# Patient Record
Sex: Female | Born: 1955
Health system: Southern US, Community
[De-identification: ages and names within clinical notes are randomized; demographics above are authoritative.]

## PROBLEM LIST (undated history)

## (undated) DIAGNOSIS — M1990 Unspecified osteoarthritis, unspecified site: Secondary | ICD-10-CM

## (undated) DIAGNOSIS — E785 Hyperlipidemia, unspecified: Secondary | ICD-10-CM

## (undated) DIAGNOSIS — G709 Myoneural disorder, unspecified: Secondary | ICD-10-CM

## (undated) DIAGNOSIS — E559 Vitamin D deficiency, unspecified: Secondary | ICD-10-CM

## (undated) DIAGNOSIS — R931 Abnormal findings on diagnostic imaging of heart and coronary circulation: Secondary | ICD-10-CM

## (undated) DIAGNOSIS — R040 Epistaxis: Secondary | ICD-10-CM

## (undated) DIAGNOSIS — Z9289 Personal history of other medical treatment: Secondary | ICD-10-CM

## (undated) DIAGNOSIS — I959 Hypotension, unspecified: Secondary | ICD-10-CM

## (undated) DIAGNOSIS — K5900 Constipation, unspecified: Secondary | ICD-10-CM

## (undated) DIAGNOSIS — J189 Pneumonia, unspecified organism: Secondary | ICD-10-CM

## (undated) DIAGNOSIS — I10 Essential (primary) hypertension: Secondary | ICD-10-CM

## (undated) DIAGNOSIS — G629 Polyneuropathy, unspecified: Secondary | ICD-10-CM

## (undated) DIAGNOSIS — T4145XA Adverse effect of unspecified anesthetic, initial encounter: Secondary | ICD-10-CM

## (undated) DIAGNOSIS — E669 Obesity, unspecified: Secondary | ICD-10-CM

## (undated) DIAGNOSIS — Z992 Dependence on renal dialysis: Secondary | ICD-10-CM

## (undated) DIAGNOSIS — D649 Anemia, unspecified: Secondary | ICD-10-CM

## (undated) DIAGNOSIS — E282 Polycystic ovarian syndrome: Secondary | ICD-10-CM

## (undated) DIAGNOSIS — I319 Disease of pericardium, unspecified: Secondary | ICD-10-CM

## (undated) DIAGNOSIS — G4730 Sleep apnea, unspecified: Secondary | ICD-10-CM

## (undated) DIAGNOSIS — J302 Other seasonal allergic rhinitis: Secondary | ICD-10-CM

## (undated) DIAGNOSIS — I480 Paroxysmal atrial fibrillation: Secondary | ICD-10-CM

## (undated) DIAGNOSIS — N186 End stage renal disease: Secondary | ICD-10-CM

## (undated) DIAGNOSIS — N2581 Secondary hyperparathyroidism of renal origin: Secondary | ICD-10-CM

## (undated) DIAGNOSIS — T8859XA Other complications of anesthesia, initial encounter: Secondary | ICD-10-CM

## (undated) HISTORY — DX: Hyperlipidemia, unspecified: E78.5

## (undated) HISTORY — DX: Sleep apnea, unspecified: G47.30

## (undated) HISTORY — PX: PANNICULECTOMY: SUR1001

## (undated) HISTORY — DX: Vitamin D deficiency, unspecified: E55.9

## (undated) HISTORY — PX: DILATION AND CURETTAGE OF UTERUS: SHX78

## (undated) HISTORY — PX: ABDOMINAL HYSTERECTOMY: SHX81

## (undated) HISTORY — PX: BREAST EXCISIONAL BIOPSY: SUR124

## (undated) HISTORY — DX: Anemia, unspecified: D64.9

## (undated) HISTORY — PX: COLONOSCOPY W/ BIOPSIES AND POLYPECTOMY: SHX1376

## (undated) HISTORY — DX: Secondary hyperparathyroidism of renal origin: N25.81

## (undated) HISTORY — PX: COLONOSCOPY: SHX5424

## (undated) HISTORY — DX: Paroxysmal atrial fibrillation: I48.0

## (undated) HISTORY — PX: BREAST SURGERY: SHX581

## (undated) HISTORY — DX: Essential (primary) hypertension: I10

## (undated) HISTORY — DX: Obesity, unspecified: E66.9

## (undated) HISTORY — DX: Abnormal findings on diagnostic imaging of heart and coronary circulation: R93.1

## (undated) HISTORY — DX: Polycystic ovarian syndrome: E28.2

## (undated) HISTORY — PX: UVULOPLASTY: SHX6557

## (undated) HISTORY — PX: KNEE ARTHROSCOPY: SHX127

## (undated) HISTORY — DX: Personal history of other medical treatment: Z92.89

## (undated) SURGERY — Surgical Case
Anesthesia: *Unknown

---
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
## 1999-03-06 ENCOUNTER — Encounter: Admission: RE | Admit: 1999-03-06 | Discharge: 1999-06-04 | Payer: Self-pay | Admitting: Family Medicine

## 1999-04-06 ENCOUNTER — Ambulatory Visit (HOSPITAL_COMMUNITY): Admission: RE | Admit: 1999-04-06 | Discharge: 1999-04-06 | Payer: Self-pay | Admitting: Family Medicine

## 1999-04-06 ENCOUNTER — Encounter: Payer: Self-pay | Admitting: Obstetrics & Gynecology

## 2000-05-15 ENCOUNTER — Encounter: Payer: Self-pay | Admitting: Family Medicine

## 2000-05-15 ENCOUNTER — Ambulatory Visit (HOSPITAL_COMMUNITY): Admission: RE | Admit: 2000-05-15 | Discharge: 2000-05-15 | Payer: Self-pay | Admitting: Family Medicine

## 2002-09-03 ENCOUNTER — Encounter: Payer: Self-pay | Admitting: Obstetrics and Gynecology

## 2002-09-03 ENCOUNTER — Ambulatory Visit (HOSPITAL_COMMUNITY): Admission: RE | Admit: 2002-09-03 | Discharge: 2002-09-03 | Payer: Self-pay | Admitting: Obstetrics and Gynecology

## 2002-09-17 ENCOUNTER — Encounter: Payer: Self-pay | Admitting: Obstetrics and Gynecology

## 2002-09-17 ENCOUNTER — Encounter: Admission: RE | Admit: 2002-09-17 | Discharge: 2002-09-17 | Payer: Self-pay | Admitting: Obstetrics and Gynecology

## 2002-10-27 ENCOUNTER — Encounter: Payer: Self-pay | Admitting: Obstetrics and Gynecology

## 2002-10-27 ENCOUNTER — Ambulatory Visit (HOSPITAL_COMMUNITY): Admission: RE | Admit: 2002-10-27 | Discharge: 2002-10-27 | Payer: Self-pay | Admitting: Obstetrics and Gynecology

## 2002-11-23 ENCOUNTER — Ambulatory Visit (HOSPITAL_COMMUNITY): Admission: RE | Admit: 2002-11-23 | Discharge: 2002-11-23 | Payer: Self-pay | Admitting: Cardiovascular Disease

## 2002-11-23 ENCOUNTER — Encounter: Payer: Self-pay | Admitting: Cardiovascular Disease

## 2003-05-19 ENCOUNTER — Encounter: Payer: Self-pay | Admitting: Obstetrics and Gynecology

## 2003-05-19 ENCOUNTER — Ambulatory Visit (HOSPITAL_COMMUNITY): Admission: RE | Admit: 2003-05-19 | Discharge: 2003-05-19 | Payer: Self-pay | Admitting: Obstetrics and Gynecology

## 2003-09-12 ENCOUNTER — Encounter: Admission: RE | Admit: 2003-09-12 | Discharge: 2003-09-12 | Payer: Self-pay | Admitting: Family Medicine

## 2003-09-12 ENCOUNTER — Encounter: Payer: Self-pay | Admitting: Family Medicine

## 2005-11-22 ENCOUNTER — Ambulatory Visit (HOSPITAL_COMMUNITY): Admission: RE | Admit: 2005-11-22 | Discharge: 2005-11-22 | Payer: Self-pay | Admitting: Obstetrics and Gynecology

## 2005-11-22 ENCOUNTER — Other Ambulatory Visit: Admission: RE | Admit: 2005-11-22 | Discharge: 2005-11-22 | Payer: Self-pay | Admitting: Obstetrics and Gynecology

## 2006-08-28 ENCOUNTER — Ambulatory Visit (HOSPITAL_COMMUNITY): Admission: RE | Admit: 2006-08-28 | Discharge: 2006-08-28 | Payer: Self-pay | Admitting: Obstetrics and Gynecology

## 2006-09-10 ENCOUNTER — Ambulatory Visit (HOSPITAL_COMMUNITY): Admission: RE | Admit: 2006-09-10 | Discharge: 2006-09-10 | Payer: Self-pay | Admitting: Obstetrics and Gynecology

## 2007-10-02 ENCOUNTER — Ambulatory Visit (HOSPITAL_COMMUNITY): Admission: RE | Admit: 2007-10-02 | Discharge: 2007-10-02 | Payer: Self-pay | Admitting: Family Medicine

## 2007-10-21 ENCOUNTER — Encounter: Admission: RE | Admit: 2007-10-21 | Discharge: 2007-10-21 | Payer: Self-pay | Admitting: Family Medicine

## 2008-10-17 ENCOUNTER — Ambulatory Visit (HOSPITAL_COMMUNITY): Admission: RE | Admit: 2008-10-17 | Discharge: 2008-10-17 | Payer: Self-pay | Admitting: Obstetrics and Gynecology

## 2009-02-08 ENCOUNTER — Encounter: Admission: RE | Admit: 2009-02-08 | Discharge: 2009-02-08 | Payer: Self-pay | Admitting: Family Medicine

## 2009-10-24 ENCOUNTER — Ambulatory Visit (HOSPITAL_COMMUNITY): Admission: RE | Admit: 2009-10-24 | Discharge: 2009-10-24 | Payer: Self-pay | Admitting: Family Medicine

## 2009-11-30 ENCOUNTER — Ambulatory Visit: Payer: Self-pay | Admitting: Cardiology

## 2009-11-30 ENCOUNTER — Inpatient Hospital Stay (HOSPITAL_COMMUNITY): Admission: EM | Admit: 2009-11-30 | Discharge: 2009-12-01 | Payer: Self-pay | Admitting: Emergency Medicine

## 2009-12-06 ENCOUNTER — Telehealth (INDEPENDENT_AMBULATORY_CARE_PROVIDER_SITE_OTHER): Payer: Self-pay | Admitting: *Deleted

## 2009-12-07 ENCOUNTER — Ambulatory Visit: Payer: Self-pay

## 2009-12-07 ENCOUNTER — Encounter: Payer: Self-pay | Admitting: Cardiology

## 2009-12-07 ENCOUNTER — Ambulatory Visit: Payer: Self-pay | Admitting: Internal Medicine

## 2009-12-07 ENCOUNTER — Encounter (HOSPITAL_COMMUNITY): Admission: RE | Admit: 2009-12-07 | Discharge: 2010-02-05 | Payer: Self-pay | Admitting: Cardiology

## 2009-12-07 ENCOUNTER — Ambulatory Visit: Payer: Self-pay | Admitting: Cardiology

## 2009-12-07 LAB — CONVERTED CEMR LAB: POC INR: 1.3

## 2009-12-14 ENCOUNTER — Ambulatory Visit: Payer: Self-pay | Admitting: Internal Medicine

## 2009-12-14 ENCOUNTER — Encounter: Payer: Self-pay | Admitting: Cardiology

## 2009-12-14 ENCOUNTER — Ambulatory Visit: Payer: Self-pay

## 2009-12-14 ENCOUNTER — Ambulatory Visit (HOSPITAL_COMMUNITY): Admission: RE | Admit: 2009-12-14 | Discharge: 2009-12-14 | Payer: Self-pay | Admitting: Cardiology

## 2009-12-14 ENCOUNTER — Ambulatory Visit: Payer: Self-pay | Admitting: Cardiology

## 2009-12-14 LAB — CONVERTED CEMR LAB: POC INR: 3

## 2009-12-15 ENCOUNTER — Encounter: Payer: Self-pay | Admitting: Cardiology

## 2009-12-20 ENCOUNTER — Ambulatory Visit: Payer: Self-pay | Admitting: Cardiology

## 2009-12-20 ENCOUNTER — Encounter: Payer: Self-pay | Admitting: Physician Assistant

## 2009-12-20 DIAGNOSIS — R079 Chest pain, unspecified: Secondary | ICD-10-CM | POA: Insufficient documentation

## 2009-12-20 DIAGNOSIS — R9439 Abnormal result of other cardiovascular function study: Secondary | ICD-10-CM | POA: Insufficient documentation

## 2009-12-20 DIAGNOSIS — E118 Type 2 diabetes mellitus with unspecified complications: Secondary | ICD-10-CM | POA: Insufficient documentation

## 2009-12-20 DIAGNOSIS — E669 Obesity, unspecified: Secondary | ICD-10-CM | POA: Insufficient documentation

## 2009-12-20 DIAGNOSIS — I480 Paroxysmal atrial fibrillation: Secondary | ICD-10-CM | POA: Insufficient documentation

## 2009-12-20 DIAGNOSIS — I10 Essential (primary) hypertension: Secondary | ICD-10-CM | POA: Insufficient documentation

## 2009-12-20 DIAGNOSIS — N189 Chronic kidney disease, unspecified: Secondary | ICD-10-CM | POA: Insufficient documentation

## 2009-12-21 ENCOUNTER — Ambulatory Visit: Payer: Self-pay | Admitting: Cardiology

## 2009-12-21 ENCOUNTER — Encounter (INDEPENDENT_AMBULATORY_CARE_PROVIDER_SITE_OTHER): Payer: Self-pay | Admitting: Cardiology

## 2009-12-21 LAB — CONVERTED CEMR LAB: POC INR: 3.1

## 2009-12-26 ENCOUNTER — Ambulatory Visit: Payer: Self-pay | Admitting: Cardiology

## 2009-12-27 ENCOUNTER — Encounter: Payer: Self-pay | Admitting: Cardiology

## 2009-12-28 ENCOUNTER — Ambulatory Visit: Payer: Self-pay | Admitting: Internal Medicine

## 2009-12-28 ENCOUNTER — Telehealth (INDEPENDENT_AMBULATORY_CARE_PROVIDER_SITE_OTHER): Payer: Self-pay | Admitting: *Deleted

## 2009-12-28 LAB — CONVERTED CEMR LAB: POC INR: 2.4

## 2010-01-08 ENCOUNTER — Ambulatory Visit: Payer: Self-pay | Admitting: Cardiovascular Disease

## 2010-01-08 LAB — CONVERTED CEMR LAB: POC INR: 3.2

## 2010-01-17 ENCOUNTER — Telehealth: Payer: Self-pay | Admitting: Cardiology

## 2010-01-25 ENCOUNTER — Ambulatory Visit: Payer: Self-pay | Admitting: Cardiology

## 2010-01-25 LAB — CONVERTED CEMR LAB: POC INR: 3.3

## 2010-02-01 ENCOUNTER — Telehealth (INDEPENDENT_AMBULATORY_CARE_PROVIDER_SITE_OTHER): Payer: Self-pay | Admitting: *Deleted

## 2010-02-08 ENCOUNTER — Ambulatory Visit: Payer: Self-pay | Admitting: Cardiology

## 2010-02-08 LAB — CONVERTED CEMR LAB: POC INR: 2.8

## 2010-02-21 ENCOUNTER — Telehealth: Payer: Self-pay | Admitting: Cardiology

## 2010-03-01 ENCOUNTER — Ambulatory Visit: Payer: Self-pay | Admitting: Internal Medicine

## 2010-03-01 LAB — CONVERTED CEMR LAB: POC INR: 2.3

## 2010-03-07 LAB — CONVERTED CEMR LAB
BUN: 29 mg/dL — ABNORMAL HIGH (ref 6–23)
CO2: 31 meq/L (ref 19–32)
Calcium: 9.3 mg/dL (ref 8.4–10.5)
Chloride: 107 meq/L (ref 96–112)
Creatinine, Ser: 2 mg/dL — ABNORMAL HIGH (ref 0.4–1.2)
GFR calc non Af Amer: 33.52 mL/min (ref >60)
Glucose, Bld: 83 mg/dL (ref 70–99)
Potassium: 4.3 meq/L (ref 3.5–5.1)
Sodium: 145 meq/L (ref 135–145)

## 2010-03-08 ENCOUNTER — Telehealth: Payer: Self-pay | Admitting: Cardiology

## 2010-03-15 ENCOUNTER — Encounter: Payer: Self-pay | Admitting: Cardiology

## 2010-03-21 ENCOUNTER — Ambulatory Visit (HOSPITAL_COMMUNITY): Admission: RE | Admit: 2010-03-21 | Discharge: 2010-03-21 | Payer: Self-pay | Admitting: Family Medicine

## 2010-03-29 ENCOUNTER — Ambulatory Visit: Payer: Self-pay | Admitting: Cardiology

## 2010-03-29 LAB — CONVERTED CEMR LAB: POC INR: 2.5

## 2010-04-26 ENCOUNTER — Ambulatory Visit: Payer: Self-pay | Admitting: Cardiology

## 2010-04-26 LAB — CONVERTED CEMR LAB: POC INR: 2.5

## 2010-06-26 ENCOUNTER — Telehealth: Payer: Self-pay | Admitting: Cardiology

## 2010-07-12 ENCOUNTER — Encounter: Payer: Self-pay | Admitting: Cardiology

## 2010-07-20 ENCOUNTER — Telehealth (INDEPENDENT_AMBULATORY_CARE_PROVIDER_SITE_OTHER): Payer: Self-pay | Admitting: *Deleted

## 2010-08-23 ENCOUNTER — Other Ambulatory Visit: Admission: RE | Admit: 2010-08-23 | Discharge: 2010-08-23 | Payer: Self-pay | Admitting: Family Medicine

## 2010-11-13 ENCOUNTER — Ambulatory Visit (HOSPITAL_COMMUNITY)
Admission: RE | Admit: 2010-11-13 | Discharge: 2010-11-13 | Payer: Self-pay | Source: Home / Self Care | Attending: Family Medicine | Admitting: Family Medicine

## 2010-12-23 ENCOUNTER — Encounter: Payer: Self-pay | Admitting: Family Medicine

## 2010-12-23 ENCOUNTER — Encounter: Payer: Self-pay | Admitting: Obstetrics and Gynecology

## 2010-12-26 ENCOUNTER — Encounter: Payer: Self-pay | Admitting: Cardiology

## 2011-01-01 NOTE — Letter (Signed)
Summary: Handout Printed  Printed Handout:  - Coumadin Instructions-w/out Meds 

## 2011-01-01 NOTE — Medication Information (Signed)
Summary: rov/eh  Anticoagulant Therapy  Managed by: Freddrick March, RN, BSN Referring MD: Verl Blalock MD, Marcello Moores PCP: Kelton Pillar Supervising MD: Caryl Comes MD, Remo Lipps Indication 1: Atrial Fibrillation Lab Used: [facility] [place] Site: [place] INR POC 2.4 INR RANGE 2-3  Dietary changes: yes       Details: Incr vit K  Health status changes: no    Bleeding/hemorrhagic complications: no    Recent/future hospitalizations: no    Any changes in medication regimen? no    Recent/future dental: no  Any missed doses?: no       Is patient compliant with meds? yes       Allergies (verified): 1)  ! Iodine 2)  ! * Shellfish  Anticoagulation Management History:      The patient is taking warfarin and comes in today for a routine follow up visit.  Positive risk factors for bleeding include presence of serious comorbidities.  Negative risk factors for bleeding include an age less than 62 years old.  The bleeding index is 'intermediate risk'.  Positive CHADS2 values include History of HTN and History of Diabetes.  Negative CHADS2 values include Age > 50 years old.  Anticoagulation responsible provider: Caryl Comes MD, Remo Lipps.  INR POC: 2.4.  Cuvette Lot#: [id number].  Exp: 03/2011.    Anticoagulation Management Assessment/Plan:      The patient's current anticoagulation dose is Coumadin 5 mg solr: take as directed.  The target INR is 2.0-3.0.  The next INR is due 01/10/2010.  Anticoagulation instructions were given to patient.  Results were reviewed/authorized by Freddrick March, RN, BSN.  She was notified by Freddrick March RN.         Prior Anticoagulation Instructions: INR: 3.1  Take 0.5 tab today then change to 1 tab each Monday, Wednesday, Friday and 1.5 tabs on all other days.   Recheck in 1 week.    Current Anticoagulation Instructions: INR 2.4  Continue on same dosage 1.5 tablets daily except 1 tablet on Mondays, Wednesdays, and Fridays.  Recheck in 2 weeks.

## 2011-01-01 NOTE — Assessment & Plan Note (Signed)
Summary: Cardiology Nuclear Study  Nuclear Med Background Indications for Stress Test: Evaluation for Ischemia, Post Hospital  Indications Comments: 11/30/09 thru 12/01/09 CP and palpitations, new onset Afib with RVR  History: Asthma, Myocardial Perfusion Study  History Comments: '03 MPS:no ischemia,  EF 61%  Symptoms: Chest Pressure, Chest Tightness, Diaphoresis, Dizziness, DOE, Fatigue, Palpitations, Rapid HR  Symptoms Comments: CP with afib. Last episode of [id number] since discharge.   Nuclear Pre-Procedure Cardiac Risk Factors: Hypertension, NIDDM, Obesity Caffeine/Decaff Intake: none NPO After: 11:00 PM Lungs: Clear IV 0.9% NS with Angio Cath: 20g     IV Site: (R) AC IV Started by: Eliezer Lofts EMT-P Chest Size (in) 52     Cup Size C     Height (in): 66 Weight (lb): 277 BMI: 44.87 Tech Comments: AM medications taken today.  Nuclear Med Study 1 or 2 day study:  2 day     Stress Test Type:  Stress Reading MD:  Loralie Champagne, MD     Referring MD:  Jenell Milliner, MD Resting Radionuclide:  Technetium 9m Tetrofosmin     Resting Radionuclide Dose:  33.0 mCi  Stress Radionuclide:  Technetium 70m Tetrofosmin     Stress Radionuclide Dose:  33.0 mCi   Stress Protocol Exercise Time (min):  5:46 min     Max HR:  151 bpm     Predicted Max HR:  [id number] bpm  Max Systolic BP: Q000111Q mm Hg     Percent Max HR:  89.88 %     METS: 7.0 Rate Pressure Product:  [id number]    Stress Test Technologist:  Valetta Fuller CMA-N     Nuclear Technologist:  Mariann Laster Deal RT-N  Rest Procedure  Myocardial perfusion imaging was performed at rest 45 minutes following the intravenous administration of Myoview Technetium 51m Tetrofosmin.  Stress Procedure  The patient exercised for 5:45.  The patient stopped due to fatigue and denied any chest pain.  There were no significant ST-T wave changes, only occasional PAC's.  She did have a hypertensive response to exercise, 214/61.  Myoview was injected at peak exercise and  myocardial perfusion imaging was performed after a brief delay.  QPS Raw Data Images:  Normal; no motion artifact; normal heart/lung ratio. Stress Images:  Mild basal to mid inferolateral perfusion defect.  Rest Images:  Normal homogeneous uptake in all areas of the myocardium. Subtraction (SDS):  Mild reversible basal to mid inferolateral perfusion defect.  Transient Ischemic Dilatation:  1.11  (Normal <1.22)  Lung/Heart Ratio:  .25  (Normal <0.45)  Quantitative Gated Spect Images QGS EDV:  86 ml QGS ESV:  30 ml QGS EF:  65 % QGS cine images:  Normal wall motion.    Overall Impression  Exercise Capacity: Fair exercise capacity. BP Response: Hypertensive blood pressure response. Clinical Symptoms: Fatigue, no chest pain.  ECG Impression: No significant ST segment change suggestive of ischemia. Overall Impression: Mild basal to mid inferolateral perfusion defect suggesetive of ischemia.  Normal LV systolic function.   Appended Document: Cardiology Nuclear Study Needs workup for possible cath.  Appended Document: Cardiology Nuclear Study APPT WITH PA  AT 9:15 ON 12/20/09. PT AWARE./CY

## 2011-01-01 NOTE — Progress Notes (Signed)
Summary: rx diltiazem, cozaar  Phone Note Refill Request   Refills Requested: Medication #1:  COUMADIN 5 MG SOLR take as directed   Supply Requested: 3 months  Medication #2:  DILTIAZEM HCL ER BEADS 120 MG XR24H-CAP Take one capsule by mouth daily   Supply Requested: 3 months  Medication #3:  COZAAR 50 MG TABS take one daily   Supply Requested: 3 months [facility] [phone] option 3   Method Requested: Telephone to Pharmacy Initial call taken by: Darnell Level,  January 17, 2010 12:52 PM    Prescriptions: DILTIAZEM HCL ER BEADS 120 MG XR24H-CAP (DILTIAZEM HCL ER BEADS) Take one capsule by mouth daily  #90 x 3   Entered by:   Julaine Hua, CMA   Authorized by:   Renella Cunas, MD, [facility]   Signed by:   Julaine Hua, CMA on 01/17/2010   Method used:   Faxed to ...       [employer] [facility] (mail-order)       [place]  [address]       Ph: [phone]       Fax: [phone]   [id number] COZAAR 50 MG TABS (LOSARTAN POTASSIUM) take one daily  #90 x 3   Entered by:   Julaine Hua, CMA   Authorized by:   Renella Cunas, MD, [facility]   Signed by:   Julaine Hua, CMA on 01/17/2010   Method used:   Faxed to ...       [address] [facility] (mail-order)       [place]  [address]       Ph: [phone]       Fax: [phone]   RxID:   [id number]

## 2011-01-01 NOTE — Medication Information (Signed)
Summary: rov/tm  Anticoagulant Therapy  Managed by: Bonnita Nasuti, PharmD Referring MD: Verl Blalock MD, Audry Riles MD: Lovena Le MD, Carleene Overlie Indication 1: Atrial Fibrillation Lab Used: [facility] Site: [place] INR POC 3.0 INR RANGE 2-3  Dietary changes: yes       Details: Has not been eating as many leafy greens as she normally does because furnace is broken.  Health status changes: no    Bleeding/hemorrhagic complications: no    Recent/future hospitalizations: no    Any changes in medication regimen? no    Recent/future dental: no  Any missed doses?: no       Is patient compliant with meds? yes       Allergies: 1)  ! Iodine 2)  ! * Shellfish  Anticoagulation Management History:      The patient is taking warfarin and comes in today for a routine follow up visit.  Negative risk factors for bleeding include an age less than 89 years old.  The bleeding index is 'low risk'.  Negative CHADS2 values include Age > 54 years old.  Anticoagulation responsible provider: Lovena Le MD, Carleene Overlie.  INR POC: 3.0.  Cuvette Lot#: [id number].  Exp: 03/2011.    Anticoagulation Management Assessment/Plan:      The target INR is 2.0-3.0.  The next INR is due 12/21/2009.  Anticoagulation instructions were given to patient.  Results were reviewed/authorized by Bonnita Nasuti, PharmD.  She was notified by Belenda Cruise, PharmD Candidate.         Prior Anticoagulation Instructions: INR 1.3 Today 10mg s( 2 pills)  then take 7.5mg s daily (1 1/2 pills) . Recheck INR on 12/12/09  Current Anticoagulation Instructions: INR 3.0  Take 1 tablet Mondays and Thursdays then 1.5 tablets daily. Recheck in 1 week.

## 2011-01-01 NOTE — Letter (Signed)
Summary: [facility] Office Note  [facility] Office Note   Imported By: Sallee Provencal 02/13/2010 13:51:51  _____________________________________________________________________  External Attachment:    Type:   Image     Comment:   External Document

## 2011-01-01 NOTE — Progress Notes (Signed)
Summary: QUESTION ABOUT DENTAL APPT  Phone Note Call from Patient Call back at Work Phone [phone]   Caller: Patient Summary of Call: PT [place] TO KNOW IF HER COUMADIN NEED TO BE STOP Initial call taken by: Delsa Sale,  February 21, 2010 10:53 AM  Follow-up for Phone Call        DENTIST OFF AWARE PT OKAY TO HAVE CLEANING WHILE ON COUMADIN PER COUMADIN CLINIC. Follow-up by: Devra Dopp, LPN,  March 23, 624THL 12:09 PM

## 2011-01-01 NOTE — Progress Notes (Signed)
Summary: Nuclear Pre-Procedure  Phone Note Outgoing Call   Call placed by: Perrin Maltese, EMT-P,  December 06, 2009 1:53 PM Summary of Call: Left message with information on Myoview Information Sheet (see scanned document for details).      Nuclear Med Background Indications for Stress Test: Evaluation for Ischemia  Indications Comments: 11/30/09 thru 12/01/09 CP and Palpitations new onset Afib with RVR  History: Myocardial Perfusion Study  History Comments: '03 MPS (-) ischemia EF 61%  Symptoms: Chest Pressure, Chest Tightness, Dizziness, DOE    Nuclear Pre-Procedure Cardiac Risk Factors: Hypertension, NIDDM  Nuclear Med Study Referring MD:  T.Wall

## 2011-01-01 NOTE — Medication Information (Signed)
Summary: Brenda Arnold  Anticoagulant Therapy  Managed by: Tula Nakayama, RN, BSN Referring MD: Verl Blalock MD, Marcello Moores PCP: Kelton Pillar Supervising MD: Burt Knack MD, Legrand Como Indication 1: Atrial Fibrillation Lab Used: [facility] [place] Site: [place] INR POC 3.2 INR RANGE 2-3           Allergies: 1)  ! Iodine 2)  ! * Shellfish  Anticoagulation Management History:      The patient is taking warfarin and comes in today for a routine follow up visit.  Positive risk factors for bleeding include presence of serious comorbidities.  Negative risk factors for bleeding include an age less than 80 years old.  The bleeding index is 'intermediate risk'.  Positive CHADS2 values include History of HTN and History of Diabetes.  Negative CHADS2 values include Age > 95 years old.  Anticoagulation responsible provider: Burt Knack MD, Legrand Como.  INR POC: 3.2.  Cuvette Lot#: [id number].  Exp: 03/2011.    Anticoagulation Management Assessment/Plan:      The patient's current anticoagulation dose is Coumadin 5 mg solr: take as directed.  The target INR is 2.0-3.0.  The next INR is due 01/22/2010.  Anticoagulation instructions were given to patient.  Results were reviewed/authorized by Tula Nakayama, RN, BSN.  She was notified by Tula Nakayama, RN, BSN.         Prior Anticoagulation Instructions: INR 2.4  Continue on same dosage 1.5 tablets daily except 1 tablet on Mondays, Wednesdays, and Fridays.  Recheck in 2 weeks.    Current Anticoagulation Instructions: INR 3.2 Skip today's dose then resume 7.5mg s everyday except 5mg s on Mondays, Wednesdays and Fridays. Recheck in 2 weeks.   Appended Document: Coumadin Clinic    Anticoagulant Therapy  Managed by: Tula Nakayama, RN, BSN Referring MD: Verl Blalock MD, Marcello Moores PCP: Kelton Pillar Supervising MD: Burt Knack MD, Legrand Como Indication 1: Atrial Fibrillation Lab Used: [facility] [place] Site: [place] INR RANGE 2-3  Dietary  changes: no    Health status changes: no    Bleeding/hemorrhagic complications: no    Recent/future hospitalizations: no    Any changes in medication regimen? no    Recent/future dental: no  Any missed doses?: no       Is patient compliant with meds? yes       Allergies: 1)  ! Iodine 2)  ! * Shellfish  Anticoagulation Management History:      Positive risk factors for bleeding include presence of serious comorbidities.  Negative risk factors for bleeding include an age less than 26 years old.  The bleeding index is 'intermediate risk'.  Positive CHADS2 values include History of HTN and History of Diabetes.  Negative CHADS2 values include Age > 66 years old.  Anticoagulation responsible provider: Burt Knack MD, Legrand Como.  Exp: 03/2011.    Anticoagulation Management Assessment/Plan:      The patient's current anticoagulation dose is Coumadin 5 mg solr: take as directed.  The target INR is 2.0-3.0.  The next INR is due 01/22/2010.  Anticoagulation instructions were given to patient.  Results were reviewed/authorized by Tula Nakayama, RN, BSN.         Prior Anticoagulation Instructions: INR 3.2 Skip today's dose then resume 7.5mg s everyday except 5mg s on Mondays, Wednesdays and Fridays. Recheck in 2 weeks.

## 2011-01-01 NOTE — Medication Information (Signed)
Summary: rov/tm  Anticoagulant Therapy  Managed by: Tula Nakayama, RN, BSN Referring MD: Verl Blalock MD, Marcello Moores PCP: Kelton Pillar Supervising MD: Percival Spanish MD, Jeneen Rinks Indication 1: Atrial Fibrillation Lab Used: [facility] [place] Site: [place] INR POC 3.3 INR RANGE 2-3  Dietary changes: no    Health status changes: no    Bleeding/hemorrhagic complications: no    Recent/future hospitalizations: no    Any changes in medication regimen? no    Recent/future dental: no  Any missed doses?: no       Is patient compliant with meds? yes       Allergies: 1)  ! Iodine 2)  ! * Shellfish  Anticoagulation Management History:      The patient is taking warfarin and comes in today for a routine follow up visit.  Positive risk factors for bleeding include presence of serious comorbidities.  Negative risk factors for bleeding include an age less than 46 years old.  The bleeding index is 'intermediate risk'.  Positive CHADS2 values include History of HTN and History of Diabetes.  Negative CHADS2 values include Age > 41 years old.  Anticoagulation responsible provider: Percival Spanish MD, Jeneen Rinks.  INR POC: 3.3.  Cuvette Lot#: [id number].  Exp: 04/2011.    Anticoagulation Management Assessment/Plan:      The patient's current anticoagulation dose is Coumadin 5 mg solr: take as directed.  The target INR is 2.0-3.0.  The next INR is due 02/08/2010.  Anticoagulation instructions were given to patient.  Results were reviewed/authorized by Tula Nakayama, RN, BSN.  She was notified by Tula Nakayama, RN, BSN.         Prior Anticoagulation Instructions: INR 3.2 Skip today's dose then resume 7.5mg s everyday except 5mg s on Mondays, Wednesdays and Fridays. Recheck in 2 weeks.   Current Anticoagulation Instructions: INR 3.3 Skip todays's dose then change dose to 5mg s everyday except 7.5mg s on Tuesdays, Thursdays and Sundays. Recheck in 2 weeks.

## 2011-01-01 NOTE — Miscellaneous (Signed)
Summary: Outpatient Coinsurance Notice  Outpatient Coinsurance Notice   Imported By: Marilynne Drivers 12/12/2009 15:49:44  _____________________________________________________________________  External Attachment:    Type:   Image     Comment:   External Document

## 2011-01-01 NOTE — Medication Information (Signed)
Summary: new to coumadin  Anticoagulant Therapy  Managed by: Tula Nakayama, RN, BSN Referring MD: Verl Blalock MD, Audry Riles MD: Harrington Challenger MD, Nevin Bloodgood Indication 1: Atrial Fibrillation Lab Used: [facility] Site: [place] INR POC 1.3  Dietary changes: no    Health status changes: no    Bleeding/hemorrhagic complications: no    Recent/future hospitalizations: yes       Details: Discharged from [facility] on 12/01/09  admitted for palpitations.   Any changes in medication regimen? yes       Details: cardizem, cozaar and ntg sl (prn) these are new meds.   Recent/future dental: no  Any missed doses?: no       Is patient compliant with meds? yes      Comments: Started coumadin on 12/01/09 with 5mg s daily.  Allergic to Iodine and Shellfish.   Anticoagulation Management History:      The patient comes in today for her initial visit for anticoagulation therapy.  Negative risk factors for bleeding include an age less than 18 years old.  The bleeding index is 'low risk'.  Negative CHADS2 values include Age > 73 years old.  Anticoagulation responsible provider: Harrington Challenger MD, Nevin Bloodgood.  INR POC: 1.3.  Cuvette Lot#: [id number].  Exp: 01/2011.    Anticoagulation Management Assessment/Plan:      The next INR is due 12/12/2009.  Anticoagulation instructions were given to patient.  Results were reviewed/authorized by Tula Nakayama, RN, BSN.  She was notified by Tula Nakayama, RN, BSN.         Current Anticoagulation Instructions: INR 1.3 Today 10mg s( 2 pills)  then take 7.5mg s daily (1 1/2 pills) . Recheck INR on 12/12/09

## 2011-01-01 NOTE — Medication Information (Signed)
Summary: rov/tm  Anticoagulant Therapy  Managed by: Porfirio Oar, PharmD Referring MD: Verl Blalock MD, Marcello Moores PCP: Kelton Pillar Supervising MD: Verl Blalock MD, Marcello Moores Indication 1: Atrial Fibrillation Lab Used: [facility] [place] Site: [place] INR POC 2.5 INR RANGE 2-3  Dietary changes: no    Health status changes: no    Bleeding/hemorrhagic complications: no    Recent/future hospitalizations: no    Any changes in medication regimen? no    Recent/future dental: no  Any missed doses?: no       Is patient compliant with meds? yes       Allergies: 1)  ! Iodine 2)  ! * Shellfish  Anticoagulation Management History:      The patient is taking warfarin and comes in today for a routine follow up visit.  Positive risk factors for bleeding include presence of serious comorbidities.  Negative risk factors for bleeding include an age less than 20 years old.  The bleeding index is 'intermediate risk'.  Positive CHADS2 values include History of HTN and History of Diabetes.  Negative CHADS2 values include Age > 85 years old.  Anticoagulation responsible provider: Verl Blalock MD, Marcello Moores.  INR POC: 2.5.  Cuvette Lot#: [id number].  Exp: 07/2011.    Anticoagulation Management Assessment/Plan:      The patient's current anticoagulation dose is Coumadin 5 mg solr: take as directed.  The target INR is 2.0-3.0.  The next INR is due 05/24/2010.  Anticoagulation instructions were given to patient.  Results were reviewed/authorized by Porfirio Oar, PharmD.  She was notified by Porfirio Oar PharmD.         Prior Anticoagulation Instructions: INR 2.5 Continue 5mg s daily except 7.5mg s on Tuesdays, Thursdays and Sundays. Recheck in 4 weeks.   Current Anticoagulation Instructions: INR 2.5  Continue same dose of 1 tablet every day except 1 1/2 tablets on Sunday, Tuesday and Thursday

## 2011-01-01 NOTE — Assessment & Plan Note (Signed)
Summary: REVIEW ABN  MYOVIEW POSSIBLE CATH   Visit Type:  Follow-up  CC:  no complaints.  History of Present Illness: This is a 55 year old, African American female, patient, who was recently admitted to the hospital with rapid atrial fibrillation after drinking Pepsi max. She also had chest pain. She was started on Cardizem and Coumadin. She converted to normal sinus rhythm. She had an outpatient stress Myoview, which was read out as mild basal to mild inferolateral perfusion defect suggestive of ischemia with normal LV systolic function. Dr. Verl Blalock had her come in for possible evaluation for cardiac catheter.  The patient denies current chest pain since she was in atrial fibrillation. She has not had any further palpitations. She does have renal insufficiency and is followed by Dr. Jamal Maes. Her creatinine in the hospital was 2.2 to cannulate it and Hyzaar were stopped. Her creatinine came down to 1.95.  I had Dr. Johnsie Cancel review her stress Myoview. He felt it was a low risk study and with her obesity and renal insufficiency, and lack of chest pain. He chose to hold off on cardiac catheterization. The patient did not want to proceed with catheter at this time because of expense. Anyway. She has an appointment to see Dr. wall next week and Dr. Lorrene Reid, next week.  The patient did lose 85 pounds doing Weight Watchers last year, but has gained 20 pounds back Thanksgiving. She knows she needs to get on a better program again.  Current Medications (verified): 1)  Aspir-Low 81 Mg Tbec (Aspirin) .... Take One Daily 2)  Coumadin 5 Mg Solr (Warfarin Sodium) .... Take As Directed 3)  Cozaar 50 Mg Tabs (Losartan Potassium) .... Take One Daily 4)  Calcium Carbonate 600 Mg Tabs (Calcium Carbonate) .... Take One Daily 5)  Fibercon 625 Mg Tabs (Calcium Polycarbophil) .... Take One Daily 6)  Drisdol 50000 Unit Caps (Ergocalciferol) .... Take One Weekly 7)  Diltiazem Hcl Er Beads 120 Mg Xr24h-Cap (Diltiazem  Hcl Er Beads) .... Take One Capsule By Mouth Daily  Allergies: 1)  ! Iodine 2)  ! * Shellfish  Past History:  Past Medical History: Last updated: 12/20/2009 1. Status post adenosine Cardiolite in 2003 in preoperative evaluation       for possible left band, which showed an EF of 61% and wall motion       within normal limits, no ischemia.   2. Diabetes.   3. Hypertension.   4. Obesity.   5. Diagnosis of a sinus infection on November 20, 2009.      Past Surgical History: Last updated: 12/20/2009 She is status post hysterectomy, uvuloplasty, right   knee surgery, and D&C.   Family History: Last updated: 12/20/2009  Her mother is alive at 23 with no heart disease and her   father died at 20 of lung cancer.  No siblings have heart disease.      Social History: Last updated: 12/20/2009 She lives in [place] alone.  She works at [employer].  She denies any history of alcohol, tobacco, or drug abuse.      Review of Systems       See history of present illness  Vital Signs:  Patient profile:   55 year old female Height:      66 inches Weight:      281 pounds Pulse rate:   100 / minute Pulse rhythm:   regular BP sitting:   160 / 88  (left arm)  Vitals Entered By: Clearence Ped  Pfohl, CMA (December 20, 2009 9:25 AM)  Physical Exam  General:  Obese, in no acute distress. Neck: No JVD, HJR, Bruit, or thyroid enlargement Lungs: No tachypnea, clear without wheezing, rales, or rhonchi Cardiovascular: RRR, PMI not displaced, heart sounds normal, no murmurs, gallops, bruit, thrill, or heave. Abdomen: BS normal. Soft without organomegaly, masses, lesions or tenderness. Extremities: without cyanosis, clubbing or edema. Good distal pulses bilateral SKin: Warm, no lesions or rashes  Musculoskeletal: No deformities Neuro: no focal signs    EKG  Procedure date:  12/20/2009  Findings:      normal sinus rhythm, no acute change  Impression & Recommendations:  Problem  # 1:  ABNORMAL CV (STRESS) TEST (ICD-794.39) Patient had one episode of chest pain when she was in rapid atrial fibrillation. Stress Myoview was read as mild basal to mid inferior lateral perfusion defect suggestive of ischemia with normal LV function. Dr. Jenkins Rouge reviewed. This scan and felt it was low risk. Given her obesity, renal insufficiency, and lack of further chest pain, we will let her follow up with Dr. Verl Blalock for further recommendations.  Problem # 2:  RENAL INSUFFICIENCY, CHRONIC (ICD-585.9) Patient has chronic renal insufficiency creatinine in the hospital, was 2.22, which improved to 1.95 when her Lasix and Hyzaar were discontinued. She has an appointment to see Dr. Jamal Maes, next week, who has followed her in the past.  Problem # 3:  ATRIAL FIBRILLATION (ICD-427.31) Patient is remaining in normal sinus rhythm. This incident of atrial fibrillation occurred when she was on Pepsi max. She has since discontinued this. She is on Coumadin and Cardizem. Her updated medication list for this problem includes:    Aspir-low 81 Mg Tbec (Aspirin) .Marland Kitchen... Take one daily    Coumadin 5 Mg Solr (Warfarin sodium) .Marland Kitchen... Take as directed  Problem # 4:  OBESITY-MORBID (>100') (ICD-278.01) the patient is encouraged to lose weight. I encouraged her to once again join Weight Watchers, which she is considering doing at the expense is limiting to her.  Patient Instructions: 1)  Your physician recommends that you schedule a follow-up appointment in: Pt has an appointment with Dr. Verl Blalock on Jan. 25th @ 4:45 PM

## 2011-01-01 NOTE — Letter (Signed)
Summary: [facility]   Imported By: Marilynne Drivers 01/18/2010 10:27:17  _____________________________________________________________________  External Attachment:    Type:   Image     Comment:   External Document

## 2011-01-01 NOTE — Progress Notes (Signed)
Summary: TEST RESULTS  Phone Note Call from Patient Call back at Work Phone [phone]   Caller: Patient Reason for Call: Lab or Test Results Initial call taken by: Delsa Sale,  March 08, 2010 12:27 PM  Follow-up for Phone Call        Pt. given results of BMP from 3/31 and that results have been sent to [facility].  She states she has upcoming appt with them. Pt requesting refill of Diltiazem ER 240 mg to [facility].(increased at Coumadin clinic visit 3/31)  Will refill Thompson Grayer, RN, BSN  March 08, 2010 12:53 PM Follow-up by: Thompson Grayer, RN, BSN,  March 08, 2010 1:05 PM    New/Updated Medications: DILTIAZEM HCL ER BEADS 240 MG XR24H-CAP (DILTIAZEM HCL ER BEADS) Take one capsule by mouth daily Prescriptions: DILTIAZEM HCL ER BEADS 240 MG XR24H-CAP (DILTIAZEM HCL ER BEADS) Take one capsule by mouth daily  #90 x 3   Entered by:   Thompson Grayer, RN, BSN   Authorized by:   Renella Cunas, MD, [facility]   Signed by:   Thompson Grayer, RN, BSN on 03/08/2010   Method used:   Faxed to ...       [address] [facility] (mail-order)       [place]  [address]       Ph: [phone]       Fax: [phone]   RxID:   [id number]

## 2011-01-01 NOTE — Assessment & Plan Note (Signed)
Summary: eph/f/u from stress test & echo/jml   Visit Type:  rov Primary Provider:  Kelton Pillar  CC:  pt here for f/u on stress test and and echo...pt states she had a few flutters after she had some caffiene.  History of Present Illness: [name] returns today for evaluation and management his chest discomfort. This occurs when she has her atrial fibrillation. She had a low risk Myoview on December 07, 2009. Propulsid being low risk, and her other multiple comorbidities, including her chronic renal insufficiency, it was decided to treat her medically.  She wishes she did not have to take Coumadin. She really likes her greens.. I reinforced moderation not cessation  Current Medications (verified): 1)  Aspir-Low 81 Mg Tbec (Aspirin) .... Take One Daily 2)  Coumadin 5 Mg Solr (Warfarin Sodium) .... Take As Directed 3)  Cozaar 50 Mg Tabs (Losartan Potassium) .... Take One Daily 4)  Calcium Carbonate 600 Mg Tabs (Calcium Carbonate) .... Take One Daily 5)  Fibercon 625 Mg Tabs (Calcium Polycarbophil) .... Take One Daily 6)  Drisdol 50000 Unit Caps (Ergocalciferol) .... Take One Weekly 7)  Diltiazem Hcl Er Beads 120 Mg Xr24h-Cap (Diltiazem Hcl Er Beads) .... Take One Capsule By Mouth Daily 8)  Colace 100 Mg Caps (Docusate Sodium) .Marland Kitchen.. 1-2 Caps At Bedtime  Allergies: 1)  ! Iodine 2)  ! * Shellfish  Past History:  Past Medical History: Last updated: 12/20/2009 1. Status post adenosine Cardiolite in 2003 in preoperative evaluation       for possible left band, which showed an EF of 61% and Yerlin Gasparyan motion       within normal limits, no ischemia.   2. Diabetes.   3. Hypertension.   4. Obesity.   5. Diagnosis of a sinus infection on November 20, 2009.      Past Surgical History: Last updated: 12/20/2009 She is status post hysterectomy, uvuloplasty, right   knee surgery, and D&C.   Family History: Last updated: 12/20/2009  Her mother is alive at 2 with no heart disease and her   father died at 1 of lung cancer.  No siblings have heart disease.      Social History: Last updated: 12/20/2009 She lives in [place] alone.  She works at [employer].  She denies any history of alcohol, tobacco, or drug abuse.      Review of Systems       negative history of present illness  Vital Signs:  Patient profile:   55 year old female Height:      66 inches Weight:      284 pounds BMI:     46.00 Pulse rate:   80 / minute Pulse rhythm:   regular BP sitting:   160 / 100  (left arm) Cuff size:   large  Vitals Entered By: Julaine Hua, CMA (December 26, 2009 4:47 PM)  Physical Exam  General:  obese.  obese.   Head:  normocephalic and atraumatic Eyes:  PERRLA/EOM intact; conjunctiva and lids normal. Neck:  Neck supple, no JVD. No masses, thyromegaly or abnormal cervical nodes. Chest Jaydis Duchene:  no deformities or breast masses noted Lungs:  Clear bilaterally to auscultation and percussion. Heart:  Non-displaced PMI, chest non-tender; regular rate and rhythm, S1, S2 without murmurs, rubs or gallops. Carotid upstroke normal, no bruit. Normal abdominal aortic size, no bruits. Femorals normal pulses, no bruits. Pedals normal pulses. No edema, no varicosities. Msk:  decreased ROM.  decreased ROM.   Pulses:  pulses normal in all 4 extremities Extremities:  trace left pedal edema and trace right pedal edema.  trace left pedal edema and trace right pedal edema.   Neurologic:  Alert and oriented x 3. Skin:  Intact without lesions or rashes. Psych:  Normal affect.   Impression & Recommendations:  Problem # 1:  CHEST PAIN-UNSPECIFIED (ICD-786.50) Assessment Improved This has improved and seems to be related to her atrial fib. We will continue medical therapy. Her updated medication list for this problem includes:    Aspir-low 81 Mg Tbec (Aspirin) .Marland Kitchen... Take one daily    Coumadin 5 Mg Solr (Warfarin sodium) .Marland Kitchen... Take as directed    Diltiazem Hcl Er Beads 120 Mg Xr24h-cap  (Diltiazem hcl er beads) .Marland Kitchen... Take one capsule by mouth daily  Problem # 2:  ATRIAL FIBRILLATION (ICD-427.31) Assessment: Unchanged  Her updated medication list for this problem includes:    Aspir-low 81 Mg Tbec (Aspirin) .Marland Kitchen... Take one daily    Coumadin 5 Mg Solr (Warfarin sodium) .Marland Kitchen... Take as directed  Problem # 3:  HYPERTENSION (ICD-401.9) Assessment: Unchanged  Her updated medication list for this problem includes:    Aspir-low 81 Mg Tbec (Aspirin) .Marland Kitchen... Take one daily    Cozaar 50 Mg Tabs (Losartan potassium) .Marland Kitchen... Take one daily    Diltiazem Hcl Er Beads 120 Mg Xr24h-cap (Diltiazem hcl er beads) .Marland Kitchen... Take one capsule by mouth daily  Problem # 4:  ABNORMAL CV (STRESS) TEST (ICD-794.39) Assessment: Unchanged  Patient Instructions: 1)  Your physician recommends that you schedule a follow-up appointment in: [place] 2)  Your physician recommends that you continue on your current medications as directed. Please refer to the Current Medication list given to you today.

## 2011-01-01 NOTE — Medication Information (Signed)
Summary: rov/tm  Anticoagulant Therapy  Managed by: Bonnita Nasuti, PharmD Referring MD: Verl Blalock MD, Audry Riles MD: Lia Foyer MD, Marcello Moores Indication 1: Atrial Fibrillation Lab Used: [facility] [place] Site: [place] INR POC 3.1 INR RANGE 2-3  Dietary changes: no    Health status changes: no    Bleeding/hemorrhagic complications: no    Recent/future hospitalizations: no    Any changes in medication regimen? no    Recent/future dental: no  Any missed doses?: no       Is patient compliant with meds? yes       Allergies (verified): 1)  ! Iodine 2)  ! * Shellfish  Anticoagulation Management History:      The patient is taking warfarin and comes in today for a routine follow up visit.  Positive risk factors for bleeding include presence of serious comorbidities.  Negative risk factors for bleeding include an age less than 53 years old.  The bleeding index is 'intermediate risk'.  Positive CHADS2 values include History of HTN and History of Diabetes.  Negative CHADS2 values include Age > 2 years old.  Anticoagulation responsible provider: Lia Foyer MD, Marcello Moores.  INR POC: 3.1.  Cuvette Lot#: [id number].  Exp: 01/2011.    Anticoagulation Management Assessment/Plan:      The patient's current anticoagulation dose is Coumadin 5 mg solr: take as directed.  The target INR is 2.0-3.0.  The next INR is due 12/28/2009.  Anticoagulation instructions were given to patient.  Results were reviewed/authorized by Bonnita Nasuti, PharmD.  She was notified by Freddrick March RN.         Prior Anticoagulation Instructions: INR 3.0  Take 1 tablet Mondays and Thursdays then 1.5 tablets daily. Recheck in 1 week.  Current Anticoagulation Instructions: INR: 3.1  Take 0.5 tab today then change to 1 tab each Monday, Wednesday, Friday and 1.5 tabs on all other days.   Recheck in 1 week.

## 2011-01-01 NOTE — Medication Information (Signed)
Summary: rov/tm  Anticoagulant Therapy  Managed by: Freddrick March, RN, BSN Referring MD: Verl Blalock MD, Marcello Moores PCP: Kelton Pillar Supervising MD: Verl Blalock MD, Marcello Moores Indication 1: Atrial Fibrillation Lab Used: [facility] [place] Site: [place] INR POC 2.8 INR RANGE 2-3  Dietary changes: yes       Details: May not have eaten as much vit K vegetables.   Health status changes: no    Bleeding/hemorrhagic complications: no    Recent/future hospitalizations: no    Any changes in medication regimen? no    Recent/future dental: no  Any missed doses?: no       Is patient compliant with meds? yes       Allergies: 1)  ! Iodine 2)  ! * Shellfish  Anticoagulation Management History:      The patient is taking warfarin and comes in today for a routine follow up visit.  Positive risk factors for bleeding include presence of serious comorbidities.  Negative risk factors for bleeding include an age less than 3 years old.  The bleeding index is 'intermediate risk'.  Positive CHADS2 values include History of HTN and History of Diabetes.  Negative CHADS2 values include Age > 68 years old.  Anticoagulation responsible provider: Verl Blalock MD, Marcello Moores.  INR POC: 2.8.  Cuvette Lot#: [id number].  Exp: 04/2011.    Anticoagulation Management Assessment/Plan:      The patient's current anticoagulation dose is Coumadin 5 mg solr: take as directed.  The target INR is 2.0-3.0.  The next INR is due 03/01/2010.  Anticoagulation instructions were given to patient.  Results were reviewed/authorized by Freddrick March, RN, BSN.  She was notified by Freddrick March RN.         Prior Anticoagulation Instructions: INR 3.3 Skip todays's dose then change dose to 5mg s everyday except 7.5mg s on Tuesdays, Thursdays and Sundays. Recheck in 2 weeks.   Current Anticoagulation Instructions: INR 2.8  Continue on same dosage 1 tablet daily except 1.5 tablets on Sundays, Tuesdays and Thursdays.  Recheck in 3 weeks.

## 2011-01-01 NOTE — Medication Information (Signed)
Summary: rov/ewj  Anticoagulant Therapy  Managed by: Bonnita Nasuti, PharmD, BCPS, CPP Referring MD: Verl Blalock MD, Marcello Moores PCP: Kelton Pillar Supervising MD: Lovena Le MD, Carleene Overlie Indication 1: Atrial Fibrillation Lab Used: [facility] [place] Site: [place] INR POC 2.3 INR RANGE 2-3  Dietary changes: no    Health status changes: no    Bleeding/hemorrhagic complications: no    Recent/future hospitalizations: no    Any changes in medication regimen? no    Recent/future dental: no  Any missed doses?: no       Is patient compliant with meds? yes       Current Medications (verified): 1)  Aspir-Low 81 Mg Tbec (Aspirin) .... Take One Daily 2)  Coumadin 5 Mg Solr (Warfarin Sodium) .... Take As Directed 3)  Cozaar 50 Mg Tabs (Losartan Potassium) .... Take One Daily 4)  Calcium Carbonate 600 Mg Tabs (Calcium Carbonate) .... Take One Daily 5)  Fibercon 625 Mg Tabs (Calcium Polycarbophil) .... Take One Daily 6)  Drisdol 50000 Unit Caps (Ergocalciferol) .... Take One Weekly 7)  Diltiazem Hcl Er Beads 120 Mg Xr24h-Cap (Diltiazem Hcl Er Beads) .... Take One Capsule By Mouth Daily 8)  Colace 100 Mg Caps (Docusate Sodium) .Marland Kitchen.. 1-2 Caps At Bedtime 9)  Furosemide 40 Mg Tabs (Furosemide) .... Take One Tablet By Mouth Daily.  Allergies (verified): 1)  ! Iodine 2)  ! * Shellfish  Anticoagulation Management History:      The patient is taking warfarin and comes in today for a routine follow up visit.  Positive risk factors for bleeding include presence of serious comorbidities.  Negative risk factors for bleeding include an age less than 61 years old.  The bleeding index is 'intermediate risk'.  Positive CHADS2 values include History of HTN and History of Diabetes.  Negative CHADS2 values include Age > 62 years old.  Anticoagulation responsible provider: Lovena Le MD, Carleene Overlie.  INR POC: 2.3.  Cuvette Lot#: [id number].  Exp: 04/2011.    Anticoagulation Management Assessment/Plan:  The patient's current anticoagulation dose is Coumadin 5 mg solr: take as directed.  The target INR is 2.0-3.0.  The next INR is due 03/29/2010.  Anticoagulation instructions were given to patient.  Results were reviewed/authorized by Bonnita Nasuti, PharmD, BCPS, CPP.         Prior Anticoagulation Instructions: INR 2.8  Continue on same dosage 1 tablet daily except 1.5 tablets on Sundays, Tuesdays and Thursdays.  Recheck in 3 weeks.    Current Anticoagulation Instructions: INR 2.3  Coumadin 1 tab = 5mg  Mon, Wed, Fri, Sat 1 and 1/2 tab = 7.5mg  Tu, Thur, Sun  Appended Document: [facility]in Clinic    Anticoagulant Therapy  Managed by: Bonnita Nasuti, PharmD, BCPS, CPP Referring MD: Verl Blalock MD, Marcello Moores PCP: Kelton Pillar Supervising MD: Lovena Le MD, Carleene Overlie Indication 1: Atrial Fibrillation Lab Used: [facility] [place] Site: [place] INR RANGE 2-3          Comments: c/o of HA and increased BP on home wrist cuff, 160-170/100  in office with large cuff bp 152/104  HR 80 spoke to Dr. Verl Blalock - pt 's cardiologist - increase Dilt 240mg  once daily, check BMET today, have her keep appt with renal MD on 4/14  Current Medications (verified): 1)  Aspir-Low 81 Mg Tbec (Aspirin) .... Take One Daily 2)  Coumadin 5 Mg Solr (Warfarin Sodium) .... Take As Directed 3)  Cozaar 50 Mg Tabs (Losartan Potassium) .... Take One Daily 4)  Calcium Carbonate 600 Mg  Tabs (Calcium Carbonate) .... Take One Daily 5)  Fibercon 625 Mg Tabs (Calcium Polycarbophil) .... Take One Daily 6)  Drisdol 50000 Unit Caps (Ergocalciferol) .... Take One Weekly 7)  Diltiazem Hcl Er Beads 120 Mg Xr24h-Cap (Diltiazem Hcl Er Beads) .... Take Two Capsule By Mouth Daily 8)  Colace 100 Mg Caps (Docusate Sodium) .Marland Kitchen.. 1-2 Caps At Bedtime 9)  Furosemide 40 Mg Tabs (Furosemide) .... Take One Tablet By Mouth Daily.  Allergies: 1)  ! Iodine 2)  ! * Shellfish  Anticoagulation Management History:      Positive risk factors  for bleeding include presence of serious comorbidities.  Negative risk factors for bleeding include an age less than 51 years old.  The bleeding index is 'intermediate risk'.  Positive CHADS2 values include History of HTN and History of Diabetes.  Negative CHADS2 values include Age > 24 years old.  Anticoagulation responsible provider: Lovena Le MD, Carleene Overlie.  Exp: 04/2011.    Anticoagulation Management Assessment/Plan:      The patient's current anticoagulation dose is Coumadin 5 mg solr: take as directed.  The target INR is 2.0-3.0.  The next INR is due 03/29/2010.  Anticoagulation instructions were given to patient.  Results were reviewed/authorized by Bonnita Nasuti, PharmD, BCPS, CPP.         Prior Anticoagulation Instructions: INR 2.3  Coumadin 1 tab = 5mg  Mon, Wed, Fri, Sat 1 and 1/2 tab = 7.5mg  Tu, Thur, Sun  Appended Document: Orders Update    Clinical Lists Changes  Orders: Added new Test order of TLB-BMP (Basic Metabolic Panel-BMET) (99991111) - Signed

## 2011-01-01 NOTE — Medication Information (Signed)
Summary: Brenda Arnold - [facility]   coming after work  Anticoagulant Therapy  Managed by: Tula Nakayama, RN, BSN Referring MD: Verl Blalock MD, Marcello Moores PCP: Kelton Pillar Supervising MD: Verl Blalock MD, Marcello Moores Indication 1: Atrial Fibrillation Lab Used: [facility] [place] Site: [place] INR POC 2.5 INR RANGE 2-3  Dietary changes: no    Health status changes: yes       Details: over past couple days while overworking did have some palpitations  Bleeding/hemorrhagic complications: no    Recent/future hospitalizations: no    Any changes in medication regimen? yes       Details: Diltazem increased from 120mg s to 240mg s and Losartin increased from 50mg s to 100mg s.   Recent/future dental: no  Any missed doses?: no       Is patient compliant with meds? yes       Allergies: 1)  ! Iodine 2)  ! * Shellfish  Anticoagulation Management History:      The patient is taking warfarin and comes in today for a routine follow up visit.  Positive risk factors for bleeding include presence of serious comorbidities.  Negative risk factors for bleeding include an age less than 8 years old.  The bleeding index is 'intermediate risk'.  Positive CHADS2 values include History of HTN and History of Diabetes.  Negative CHADS2 values include Age > 69 years old.  Anticoagulation responsible provider: Verl Blalock MD, Marcello Moores.  INR POC: 2.5.  Cuvette Lot#: [id number].  Exp: 05/2011.    Anticoagulation Management Assessment/Plan:      The patient's current anticoagulation dose is Arnold 5 mg solr: take as directed.  The target INR is 2.0-3.0.  The next INR is due 04/26/2010.  Anticoagulation instructions were given to patient.  Results were reviewed/authorized by Tula Nakayama, RN, BSN.  She was notified by Tula Nakayama, RN, BSN.         Prior Anticoagulation Instructions: INR 2.3  Arnold 1 tab = 5mg  Mon, Wed, Fri, Sat 1 and 1/2 tab = 7.5mg  Tu, Thur, Sun  Current Anticoagulation Instructions: INR 2.5 Continue  5mg s daily except 7.5mg s on Tuesdays, Thursdays and Sundays. Recheck in 4 weeks.

## 2011-01-01 NOTE — Progress Notes (Signed)
  Phone Note From Other Clinic   Caller: [facility] Details for Reason: Pt.Information Initial call taken by: Marilynne Halsted faxed to [place]  December 28, 2009 9:48 AM

## 2011-01-01 NOTE — Progress Notes (Signed)
  Phone Note Outgoing Call   Call placed by: Devra Dopp, LPN,  March  3, [id number] 11:05 AM Call placed to: Patient Summary of Call: CALL PLACED TO PT RE ELEVATED B/P 182/90 PER PT STARTED TAKING MEDS IN AM AND HAS DECREASE SALT INTAKE  WEIGHT HAS FLUCTUATED 3-5  POUNDS PER PT W ILL GET A BETTER HANDLE ON THIS  ENCOURAGED PT TO PURCHASE B/P MONITOR   WILL ATTEMPT  TO BUY MONITOR WHEN RECIEVES INCOME TAX INSTRUCTED TO CHECK B/P AND IF  B/P REPEATEDLY RUNS HIGH TO CALL OFF.VERBALIZED UNDERSTANDING. PER PT FEELS FINE AT THIS TIME Initial call taken by: Devra Dopp, LPN,  March  3, [id number] 11:29 AM

## 2011-01-01 NOTE — Progress Notes (Signed)
  Pt signed ROI 8/18, I faxed All Cardiac Records over to [facility] 8/19. Brenda Arnold  July 20, 2010 8:28 AM

## 2011-01-01 NOTE — Medication Information (Signed)
Summary: [facility]  Anticoagulant Therapy  Managed by: Inactive Referring MD: Verl Blalock MD, Marcello Moores PCP: Kelton Pillar Supervising MD: Verl Blalock MD, Marcello Moores Indication 1: Atrial Fibrillation Lab Used: [facility] Site: [place] INR RANGE 2-3          Comments: Pt is now going to follow with her primary MD Dr Kelton Pillar d/t copay costs.  Pt will call to schedule f/u, will fax Coumadin flowsheet and records. Tula Nakayama, RN, BSN  July 12, 2010 4:46 PM   Allergies: 1)  ! Iodine 2)  ! * Shellfish  Anticoagulation Management History:      Positive risk factors for bleeding include presence of serious comorbidities.  Negative risk factors for bleeding include an age less than 63 years old.  The bleeding index is 'intermediate risk'.  Positive CHADS2 values include History of HTN and History of Diabetes.  Negative CHADS2 values include Age > 99 years old.  Anticoagulation responsible provider: Verl Blalock MD, Marcello Moores.  Exp: 07/2011.    Anticoagulation Management Assessment/Plan:      The patient's current anticoagulation dose is Coumadin 5 mg solr: take as directed.  The target INR is 2.0-3.0.  The next INR is due 05/24/2010.  Anticoagulation instructions were given to patient.  Results were reviewed/authorized by Inactive.         Prior Anticoagulation Instructions: INR 2.5  Continue same dose of 1 tablet every day except 1 1/2 tablets on Sunday, Tuesday and Thursday

## 2011-01-01 NOTE — Progress Notes (Signed)
Summary: refill request  Phone Note Refill Request   Refills Requested: Medication #1:  DILTIAZEM HCL ER BEADS 240 MG XR24H-CAP Take one capsule by mouth daily  Medication #2:  FUROSEMIDE 40 MG TABS Take one tablet by mouth daily.Marland Kitchen  and larsartin 100mg  pls [facility] [phone]    Method Requested: Fax to [facility] Initial call taken by: Lorenda Hatchet,  June 26, 2010 10:52 AM    New/Updated Medications: LOSARTAN POTASSIUM 100 MG TABS (LOSARTAN POTASSIUM) 1 tab once daily Prescriptions: FUROSEMIDE 40 MG TABS (FUROSEMIDE) Take one tablet by mouth daily.  #90 x 3   Entered by:   Julaine Hua, CMA   Authorized by:   Renella Cunas, MD, [facility]   Signed by:   Julaine Hua, CMA on 06/26/2010   Method used:   Faxed to ...       [facility] (mail-order)       [place]  [address]       Ph: [phone]       Fax: [phone]   RxID:   [id number] DILTIAZEM HCL ER BEADS 240 MG XR24H-CAP (DILTIAZEM HCL ER BEADS) Take one capsule by mouth daily  #90 x 3   Entered by:   Julaine Hua, CMA   Authorized by:   Renella Cunas, MD, [facility]   Signed by:   Julaine Hua, CMA on 06/26/2010   Method used:   Faxed to ...       [facility] (mail-order)       [place]  [address]       Ph: [phone]       Fax: [phone]   RxID:   [id number] LOSARTAN POTASSIUM 100 MG TABS (LOSARTAN POTASSIUM) 1 tab once daily  #90 x 3   Entered by:   Julaine Hua, CMA   Authorized by:   Renella Cunas, MD, [facility]   Signed by:   Julaine Hua, CMA on 06/26/2010   Method used:   Faxed to ...       [facility]       [address] [place]  [address]       Ph: [phone]       Fax: [phone]   RxID:   [id number]

## 2011-01-04 NOTE — Consult Note (Signed)
Summary: [facility]   Imported By: Marilynne Drivers 05/25/2010 15:47:48  _____________________________________________________________________  External Attachment:    Type:   Image     Comment:   External Document

## 2011-01-07 ENCOUNTER — Encounter: Payer: Self-pay | Admitting: Cardiology

## 2011-01-10 ENCOUNTER — Other Ambulatory Visit: Payer: Self-pay | Admitting: Nephrology

## 2011-01-10 DIAGNOSIS — N183 Chronic kidney disease, stage 3 unspecified: Secondary | ICD-10-CM

## 2011-01-10 DIAGNOSIS — I10 Essential (primary) hypertension: Secondary | ICD-10-CM

## 2011-01-14 ENCOUNTER — Encounter: Payer: Self-pay | Admitting: Cardiology

## 2011-01-14 ENCOUNTER — Ambulatory Visit
Admission: RE | Admit: 2011-01-14 | Discharge: 2011-01-14 | Disposition: A | Discharge status: Home / Self Care | Payer: BC Managed Care – PPO | Source: Ambulatory Visit | Attending: Nephrology | Admitting: Nephrology

## 2011-01-14 DIAGNOSIS — I10 Essential (primary) hypertension: Secondary | ICD-10-CM

## 2011-01-14 DIAGNOSIS — N183 Chronic kidney disease, stage 3 unspecified: Secondary | ICD-10-CM

## 2011-01-23 NOTE — Letter (Signed)
Summary: [facility] Patient Note   [facility] Patient Note   Imported By: Sallee Provencal 01/17/2011 12:40:12  _____________________________________________________________________  External Attachment:    Type:   Image     Comment:   External Document

## 2011-02-12 NOTE — Letter (Signed)
Summary: [facility]   Imported By: Marilynne Drivers 02/05/2011 10:23:14  _____________________________________________________________________  External Attachment:    Type:   Image     Comment:   External Document

## 2011-03-04 LAB — CARDIAC PANEL(CRET KIN+CKTOT+MB+TROPI)
CK, MB: 2.1 ng/mL (ref 0.3–4.0)
Relative Index: 1.8 (ref 0.0–2.5)
Total CK: 119 U/L (ref 7–177)
Troponin I: 0.03 ng/mL (ref 0.00–0.06)

## 2011-03-04 LAB — POCT CARDIAC MARKERS
CKMB, poc: 1.1 ng/mL (ref 1.0–8.0)
Myoglobin, poc: 139 ng/mL (ref 12–200)
Troponin i, poc: <0.05 ng/mL (ref 0.00–0.09)

## 2011-03-04 LAB — BASIC METABOLIC PANEL WITH GFR
BUN: 36 mg/dL — ABNORMAL HIGH (ref 6–23)
BUN: 38 mg/dL — ABNORMAL HIGH (ref 6–23)
CO2: 30 meq/L (ref 19–32)
Calcium: 9.2 mg/dL (ref 8.4–10.5)
Chloride: 104 meq/L (ref 96–112)
Chloride: 105 meq/L (ref 96–112)
Creatinine, Ser: 2.22 mg/dL — ABNORMAL HIGH (ref 0.4–1.2)
GFR calc Af Amer: 28 mL/min — ABNORMAL LOW (ref >60)
GFR calc non Af Amer: 23 mL/min — ABNORMAL LOW (ref >60)
Potassium: 3.7 meq/L (ref 3.5–5.1)
Sodium: 142 meq/L (ref 135–145)

## 2011-03-04 LAB — TROPONIN I
Troponin I: 0.02 ng/mL (ref 0.00–0.06)
Troponin I: 0.02 ng/mL (ref 0.00–0.06)

## 2011-03-04 LAB — CBC
HCT: 36.8 % (ref 36.0–46.0)
HCT: 40.9 % (ref 36.0–46.0)
Hemoglobin: 12.4 g/dL (ref 12.0–15.0)
Hemoglobin: 13.8 g/dL (ref 12.0–15.0)
MCHC: 33.5 g/dL (ref 30.0–36.0)
MCHC: 33.8 g/dL (ref 30.0–36.0)
MCV: 89.7 fL (ref 78.0–100.0)
MCV: 89.7 fL (ref 78.0–100.0)
Platelets: 155 10*3/uL (ref 150–400)
Platelets: 190 10*3/uL (ref 150–400)
RBC: 4.11 MIL/uL (ref 3.87–5.11)
RBC: 4.56 MIL/uL (ref 3.87–5.11)
RDW: 13.4 % (ref 11.5–15.5)
RDW: 13.9 % (ref 11.5–15.5)
WBC: 8.9 10*3/uL (ref 4.0–10.5)
WBC: 9.8 10*3/uL (ref 4.0–10.5)

## 2011-03-04 LAB — GLUCOSE, CAPILLARY
Glucose-Capillary: 139 mg/dL — ABNORMAL HIGH (ref 70–99)
Glucose-Capillary: 93 mg/dL (ref 70–99)

## 2011-03-04 LAB — DIFFERENTIAL
Basophils Absolute: 0 10*3/uL (ref 0.0–0.1)
Basophils Relative: 0 % (ref 0–1)
Eosinophils Absolute: 0.3 10*3/uL (ref 0.0–0.7)
Eosinophils Relative: 3 % (ref 0–5)
Lymphocytes Relative: 20 % (ref 12–46)
Lymphs Abs: 1.9 10*3/uL (ref 0.7–4.0)
Monocytes Absolute: 0.8 10*3/uL (ref 0.1–1.0)
Monocytes Relative: 9 % (ref 3–12)
Neutro Abs: 6.7 10*3/uL (ref 1.7–7.7)
Neutrophils Relative %: 68 % (ref 43–77)

## 2011-03-04 LAB — BASIC METABOLIC PANEL
CO2: 28 mEq/L (ref 19–32)
Calcium: 9 mg/dL (ref 8.4–10.5)
Creatinine, Ser: 1.95 mg/dL — ABNORMAL HIGH (ref 0.4–1.2)
GFR calc Af Amer: 33 mL/min — ABNORMAL LOW (ref >60)
GFR calc non Af Amer: 27 mL/min — ABNORMAL LOW (ref >60)
Glucose, Bld: 122 mg/dL — ABNORMAL HIGH (ref 70–99)
Glucose, Bld: 90 mg/dL (ref 70–99)
Potassium: 3.8 mEq/L (ref 3.5–5.1)
Sodium: 138 mEq/L (ref 135–145)

## 2011-03-04 LAB — CK TOTAL AND CKMB (NOT AT ARMC)
CK, MB: 2.4 ng/mL (ref 0.3–4.0)
CK, MB: 2.9 ng/mL (ref 0.3–4.0)
Relative Index: 2 (ref 0.0–2.5)
Relative Index: 2.1 (ref 0.0–2.5)
Total CK: 120 U/L (ref 7–177)
Total CK: 141 U/L (ref 7–177)

## 2011-03-04 LAB — PROTIME-INR
INR: 1.02 (ref 0.00–1.49)
Prothrombin Time: 13.3 s (ref 11.6–15.2)

## 2011-03-04 LAB — BRAIN NATRIURETIC PEPTIDE: Pro B Natriuretic peptide (BNP): <30 pg/mL (ref 0.0–100.0)

## 2011-03-04 LAB — APTT: aPTT: 29 s (ref 24–37)

## 2011-03-04 LAB — HEPARIN LEVEL (UNFRACTIONATED)
Heparin Unfractionated: 0.46 IU/mL (ref 0.30–0.70)
Heparin Unfractionated: 0.59 [IU]/mL (ref 0.30–0.70)

## 2011-03-04 LAB — HEMOGLOBIN A1C
Hgb A1c MFr Bld: 5.9 % (ref 4.6–6.1)
Mean Plasma Glucose: 123 mg/dL

## 2011-03-04 LAB — TSH: TSH: 0.903 u[IU]/mL (ref 0.350–4.500)

## 2011-06-11 ENCOUNTER — Encounter: Payer: Self-pay | Admitting: Cardiology

## 2011-07-18 ENCOUNTER — Encounter: Payer: Self-pay | Admitting: Vascular Surgery

## 2011-08-07 ENCOUNTER — Ambulatory Visit: Payer: BC Managed Care – PPO | Admitting: Vascular Surgery

## 2011-08-28 ENCOUNTER — Encounter: Payer: Self-pay | Admitting: Vascular Surgery

## 2011-08-29 ENCOUNTER — Ambulatory Visit (INDEPENDENT_AMBULATORY_CARE_PROVIDER_SITE_OTHER): Payer: BC Managed Care – PPO | Admitting: Vascular Surgery

## 2011-08-29 ENCOUNTER — Ambulatory Visit (INDEPENDENT_AMBULATORY_CARE_PROVIDER_SITE_OTHER): Payer: BC Managed Care – PPO | Admitting: *Deleted

## 2011-08-29 ENCOUNTER — Encounter: Payer: Self-pay | Admitting: Vascular Surgery

## 2011-08-29 ENCOUNTER — Other Ambulatory Visit: Payer: Self-pay | Admitting: Cardiology

## 2011-08-29 VITALS — BP 138/71 | HR 71 | Resp 20 | Ht 66.0 in | Wt 300.1 lb

## 2011-08-29 DIAGNOSIS — N186 End stage renal disease: Secondary | ICD-10-CM

## 2011-08-29 DIAGNOSIS — Z01810 Encounter for preprocedural cardiovascular examination: Secondary | ICD-10-CM

## 2011-08-29 DIAGNOSIS — N189 Chronic kidney disease, unspecified: Secondary | ICD-10-CM

## 2011-08-29 NOTE — Progress Notes (Signed)
[facility] [place] HISTORY AND PHYSICAL   [id number] access Referring Physician:Dunham  History of Present Illness:  Patient is a 55 y.o. year old female who presents for evaluation for hemodialysis access. She is currently not on dialysis. Her renal failure saw be secondary to diabetes and hypertension. She is right-handed. Other chronic medical problems include diabetes, hypertension, obesity, sleep apnea, atrial fibrillation on Coumadin, hyperlipidemia, and asthma.  These are currently controlled  Past Medical History  Diagnosis Date  . Diabetes mellitus   . HTN (hypertension)   . Obesity   . Sinus infection     diagnosed November 20, 2009   . Chronic kidney disease   . Sleep apnea   . Dyslipidemia   . Asthma   . Paroxysmal a-fib   . Atrial fibrillation   . Sleep apnea     Past Surgical History  Procedure Date  . Hysterectomy (other)   . Uvuloplasty   . Right knee surgery   . Dilation and curettage of uterus   . Abdominal hysterectomy     with panniculctomy    ROS: [x]  Positive   [ ]  Negative   [ ]  All sytems reviewed and are negative  General:[x ] Weight loss, [ ]  Fever, [ ]  chills Neurologic: [ ]  Dizziness, [ ]  Blackouts, [ ]  Seizure [ ]  Stroke, [ ]  "Mini stroke", [ ]  Slurred speech, [ ]  Temporary blindness;  [ ] weakness,  [ ]  Hoarseness Cardiac: [ ]  Chest pain/pressure, [ ]  Shortness of breath at rest [ ]  Shortness of breath with exertion,  [x ]  Atrial fibrillation or irregular heartbeat Vascular:[ ]  Pain in legs with walking, [ ]  Pain in legs at rest ,[ ]  Pain in legs at night,  [ ]   Non-healing ulcer, [ ]  Blood clot in vein/DVT,   Pulmonary: [ ]  Home oxygen, [ ]   Productive cough, [ ]  Coughing up blood,  [ ]  Asthma,  [ ]  Wheezing Musculoskeletal:  [ ]  Arthritis, [ ]  Low back pain,  [ ]  Joint pain Hematologic:[ ]  Easy Bruising, [ ]  Anemia; [ ]  Hepatitis Gastrointestinal: [ ]  Blood in stool,  [ ]  Gastroesophageal Reflux, [ ]  Trouble  swallowing Urinary: [x ] chronic Kidney disease, [ ]  on HD - [ ]  MWF or [ ]  TTHS, [ ]  Burning with urination, [ ]  Frequent urination, [ ]  Difficulty urinating;  Skin: [ ]  Rashes, [ ]  Wounds Psychological: [ ]  Anxiety,  [ ]  Depression  Social History History  Substance Use Topics  . Smoking status: Never Smoker   . Smokeless tobacco: Not on file  . Alcohol Use: No    Family History Family History  Problem Relation Age of Onset  . Lung cancer Father   . Cancer Father     lung  . Hypertension Mother   . Diabetes Brother   . Kidney disease Brother     Allergies  Allergies  Allergen Reactions  . Iodine   . Shellfish Allergy      Current Outpatient Prescriptions  Medication Sig Dispense Refill  . Acetaminophen (TYLENOL EX ST ARTHRITIS PAIN PO) Take by mouth as needed.        Marland Kitchen aspirin (ASPIRIN EC) 81 MG EC tablet Take 81 mg by mouth daily.        . calcitRIOL (ROCALTROL) 0.25 MCG capsule Take 1 capsule by mouth daily.       . calcium carbonate (OS-CAL) 600 MG TABS Take 600 mg by mouth daily.        Marland Kitchen  cloNIDine (CATAPRES) 0.1 MG tablet Take 1 tablet by mouth 2 (two) times daily.       Marland Kitchen docusate sodium (COLACE) 100 MG capsule Take 100 mg by mouth at bedtime. Take 1-2 caps       . ergocalciferol (DRISDOL) 50000 UNITS capsule Take 50,000 Units by mouth once a week.        . furosemide (LASIX) 40 MG tablet Take 40 mg by mouth daily.        Marland Kitchen glimepiride (AMARYL) 2 MG tablet Take 1 tablet by mouth daily.       Marland Kitchen losartan (COZAAR) 100 MG tablet Take 100 mg by mouth daily.        Marland Kitchen MATZIM LA 360 MG 24 hr tablet Take 1 tablet by mouth daily.       . polycarbophil (FIBERCON) 625 MG tablet Take 625 mg by mouth daily as needed.       . warfarin (COUMADIN) 5 MG tablet Use as directed        Physical Examination  Filed Vitals:   08/29/11 1040  BP: 138/71  Pulse: 71  Resp: 20  Height: 5\' 6"  (1.676 m)  Weight: 300 lb 1.6 oz (136.124 kg)    Body mass index is 48.44  kg/(m^2).  General:  Alert and oriented, no acute distress HEENT: Normal Neck: No bruit or JVD Pulmonary: Clear to auscultation bilaterally Cardiac: Regular Rate and Rhythm without murmur Skin: No rash Extremity Pulses:  2+ radial, brachial bilat with obese upper arms Musculoskeletal: No deformity or edema  Neurologic: Upper extremity motor 5/5 and symmetric  DATA:  Patient had a vein mapping ultrasound today which I reviewed and interpreted. This shows the cephalic vein in the forearm a small bilaterally. However the cephalic vein is 4-5 mm in diameter in both upper arms. The basilic vein was 3-4 mm in the left arm 4-5 mm in the right arm.  ASSESSMENT: Needs hemodialysis access   PLAN: Risks benefits possible complications procedure details and physiology related to AV fistula, AV graft, dialysis catheter were explained the patient today. I believe the best option for or would be a left upper arm AV fistula. This would be a brachiocephalic AV fistula. This may require Superficialization at some point due to her obese upper arms. She currently wishes to think about whether or not she wants the fistula placed. She will call back to schedule her convenience.

## 2011-08-31 ENCOUNTER — Other Ambulatory Visit: Payer: Self-pay | Admitting: Cardiology

## 2011-09-03 NOTE — Procedures (Unsigned)
CEPHALIC VEIN MAPPING  INDICATION:  Preop vein mapping for dialysis access.  HISTORY: Worsening kidney function.  EXAM:  The right cephalic vein is compressible.  Diameter measurements range from 0.44 to 0.2 cm.  The right basilic vein is compressible.  Diameter measurements range from 0.49 cm to 0.23 cm.  The left cephalic vein is compressible.  Diameter measurements range from 0.47 to 0.25 cm.  The left basilic vein is compressible.  Diameter measurements range from 0.47 cm to 0.28 cm.  See attached worksheet for all measurements.  IMPRESSION:  Patent bilateral cephalic and basilic veins with diameter measurements as described above.  ___________________________________________ Jessy Oto. Fields, MD  LT/MEDQ  D:  08/29/2011  T:  08/29/2011  Job:  [id number]

## 2011-11-15 ENCOUNTER — Encounter: Payer: Self-pay | Admitting: *Deleted

## 2011-12-06 ENCOUNTER — Other Ambulatory Visit: Payer: Self-pay | Admitting: Cardiology

## 2012-02-06 ENCOUNTER — Other Ambulatory Visit: Payer: Self-pay | Admitting: Cardiology

## 2012-06-17 ENCOUNTER — Ambulatory Visit: Payer: BC Managed Care – PPO | Admitting: Vascular Surgery

## 2012-06-24 ENCOUNTER — Encounter: Payer: Self-pay | Admitting: Vascular Surgery

## 2012-06-25 ENCOUNTER — Encounter: Payer: Self-pay | Admitting: Vascular Surgery

## 2012-06-25 ENCOUNTER — Ambulatory Visit (INDEPENDENT_AMBULATORY_CARE_PROVIDER_SITE_OTHER): Payer: BC Managed Care – PPO | Admitting: *Deleted

## 2012-06-25 ENCOUNTER — Ambulatory Visit (INDEPENDENT_AMBULATORY_CARE_PROVIDER_SITE_OTHER): Payer: BC Managed Care – PPO | Admitting: Vascular Surgery

## 2012-06-25 VITALS — BP 130/76 | HR 67 | Temp 98.4°F | Ht 66.0 in | Wt 302.0 lb

## 2012-06-25 DIAGNOSIS — N184 Chronic kidney disease, stage 4 (severe): Secondary | ICD-10-CM

## 2012-06-25 DIAGNOSIS — N186 End stage renal disease: Secondary | ICD-10-CM | POA: Insufficient documentation

## 2012-06-25 DIAGNOSIS — Z01810 Encounter for preprocedural cardiovascular examination: Secondary | ICD-10-CM

## 2012-06-25 NOTE — Progress Notes (Signed)
[facility] [place] HISTORY AND PHYSICAL   History of Present Illness:  Patient is a 56 y.o. year old female who presents for placement of a permanent hemodialysis access. The patient is right handed .  The patient is not currently on hemodialysis.  The cause of renal failure is thought to be secondary to multifactorial.  Other chronic medical problems include . ABDs, hypertension, obesity, sleep apnea, hyperlipidemia, asthma, atrial fibrillation (requiring Coumadin).  Past Medical History  Diagnosis Date  . Diabetes mellitus   . HTN (hypertension)   . Obesity   . Sinus infection     diagnosed November 20, 2009   . Chronic kidney disease   . Sleep apnea   . Dyslipidemia   . Asthma   . Paroxysmal a-fib   . Atrial fibrillation   . Sleep apnea     Past Surgical History  Procedure Date  . Hysterectomy (other)   . Uvuloplasty   . Right knee surgery   . Dilation and curettage of uterus   . Abdominal hysterectomy     with panniculctomy     Social History History  Substance Use Topics  . Smoking status: Never Smoker   . Smokeless tobacco: Never Used  . Alcohol Use: No    Family History Family History  Problem Relation Age of Onset  . Lung cancer Father   . Cancer Father     lung  . Hypertension Mother   . Diabetes Brother   . Kidney disease Brother     Allergies  Allergies  Allergen Reactions  . Iodine   . Shellfish Allergy      Current Outpatient Prescriptions  Medication Sig Dispense Refill  . Acetaminophen (TYLENOL EX ST ARTHRITIS PAIN PO) Take by mouth as needed.        Marland Kitchen aspirin (ASPIRIN EC) 81 MG EC tablet Take 81 mg by mouth daily.        . calcitRIOL (ROCALTROL) 0.25 MCG capsule Take 1 capsule by mouth daily.       . calcium carbonate (OS-CAL) 600 MG TABS Take 600 mg by mouth daily.        . cloNIDine (CATAPRES) 0.1 MG tablet Take 1 tablet by mouth 2 (two) times daily.       Marland Kitchen docusate sodium (COLACE) 100 MG capsule Take 100 mg  by mouth at bedtime. Take 1-2 caps       . ergocalciferol (DRISDOL) 50000 UNITS capsule Take 50,000 Units by mouth once a week.        . furosemide (LASIX) 40 MG tablet TAKE 1 TABLET DAILY. PLEASEMAKE AN APPOINTMENT WITH   YOUR DOCTOR  90 tablet  0  . glimepiride (AMARYL) 2 MG tablet Take 1 tablet by mouth daily.       Marland Kitchen losartan (COZAAR) 100 MG tablet TAKE 1 TABLET DAILY  90 tablet  3  . MATZIM LA 360 MG 24 hr tablet Take 1 tablet by mouth daily.       . polycarbophil (FIBERCON) 625 MG tablet Take 625 mg by mouth daily as needed.       . warfarin (COUMADIN) 5 MG tablet Use as directed        ROS:   General:  No weight loss, Fever, chills  HEENT: No recent headaches, no nasal bleeding, no visual changes, no sore throat  Neurologic: No dizziness, blackouts, seizures. No recent symptoms of stroke or mini- stroke. No recent episodes of slurred speech, or temporary blindness.  Cardiac:  No recent episodes of chest pain/pressure, no shortness of breath at rest.  + shortness of breath with exertion.  + history of atrial fibrillation or irregular heartbeat  Vascular: No history of rest pain in feet.  No history of claudication.  No history of non-healing ulcer, No history of DVT   Pulmonary: No home oxygen, no productive cough, no hemoptysis,  + asthma or wheezing  Musculoskeletal:  [x ] Arthritis, [ x] Low back pain,  [ ]  Joint pain  Hematologic:No history of hypercoagulable state.  No history of easy bleeding.  No history of anemia  Gastrointestinal: No hematochezia or melena,  No gastroesophageal reflux, no trouble swallowing  Urinary: [x ] chronic Kidney disease, [ ]  on HD - [ ]  MWF or [ ]  TTHS, [ ]  Burning with urination, [ ]  Frequent urination, [ ]  Difficulty urinating;   Skin: No rashes  Psychological: No history of anxiety,  No history of depression   Physical Examination  Filed Vitals:   06/25/12 1615  BP: 130/76  Pulse: 67  Temp: 98.4 F (36.9 C)  TempSrc: Oral    Height: 5\' 6"  (1.676 m)  Weight: 302 lb (136.986 kg)  SpO2: 100%    Body mass index is 48.74 kg/(m^2).  General:  Alert and oriented, no acute distress HEENT: Normal Gastrointestinal: Soft, non-tender, non-distended, no mass, no scars Skin: No rash Extremity Pulses:  2+ radial, brachial pulses bilaterally Musculoskeletal: No deformity or edema  Neurologic: Upper and lower extremity motor 5/5 and symmetric  DATA: The patient had a vein mapping ultrasound today which I reviewed and interpreted. This shows the cephalic vein in the left side a greater than 3 mm in diameter throughout its course. The basilic vein is greater than 3 mm throughout its course. On the right upper extremity there similar findings greater than 3-4 mm in the cephalic and basilic veins.   ASSESSMENT: Needs hemodialysis access. Should be a reasonable candidate for a left radiocephalic AV fistula. I did discuss with her that if the radial artery is quite small we would consider a brachiocephalic fistula. We'll make this determination of time operation.   PLAN:  Left arm AV fistula on Wednesday, 07/22/2012. We will stop her Coumadin on August 18  Ruta Hinds, MD [facility] [place] Office: [phone] Pager: [phone]

## 2012-07-02 NOTE — Procedures (Unsigned)
CEPHALIC VEIN MAPPING  INDICATION:  Chronic kidney disease stage IV.  HISTORY:  EXAM: The right cephalic vein is compressible with diameter measurements ranging from 0.28 to 0.44 cm.  The right basilic vein is compressible with diameter measurements ranging from 0.38 to 0.53 cm.  The left cephalic vein is compressible with diameter measurements ranging from 0.30 to 0.56 cm.  The left basilic vein is compressible with diameter measurements ranging from 0.37 to 0.55 cm.  See attached worksheet for all measurements.  IMPRESSION:  Patent bilateral cephalic and basilic veins with diameter measurements as described above.  ___________________________________________ Jessy Oto. Fields, MD  CH/MEDQ  D:  06/29/2012  T:  06/29/2012  Job:  [id number]

## 2012-07-03 ENCOUNTER — Other Ambulatory Visit: Payer: Self-pay

## 2012-07-07 ENCOUNTER — Other Ambulatory Visit: Payer: Self-pay | Admitting: Cardiology

## 2012-07-07 ENCOUNTER — Telehealth: Payer: Self-pay

## 2012-07-07 NOTE — Telephone Encounter (Signed)
Phone call from pt.  States she wants to cancel surgery scheduled on 07/22/12 (left arm arteriovenous fistula creat.) due to a "project at work".  States "will call to reschedule after seeing Dr. Lorrene Reid in October, when my labs get rechecked."

## 2012-07-22 ENCOUNTER — Encounter (HOSPITAL_COMMUNITY): Admission: RE | Payer: Self-pay | Source: Ambulatory Visit

## 2012-07-22 ENCOUNTER — Ambulatory Visit (HOSPITAL_COMMUNITY)
Admission: RE | Admit: 2012-07-22 | Payer: BC Managed Care – PPO | Source: Ambulatory Visit | Admitting: Vascular Surgery

## 2012-07-22 SURGERY — ARTERIOVENOUS (AV) FISTULA CREATION
Anesthesia: Monitor Anesthesia Care | Site: Arm Lower | Laterality: Left

## 2012-08-21 ENCOUNTER — Other Ambulatory Visit: Payer: Self-pay | Admitting: *Deleted

## 2012-08-21 ENCOUNTER — Encounter: Payer: Self-pay | Admitting: *Deleted

## 2012-08-24 ENCOUNTER — Telehealth: Payer: Self-pay | Admitting: Cardiology

## 2012-08-24 NOTE — Telephone Encounter (Signed)
F/u  Returning [facility] called.

## 2012-08-24 NOTE — Telephone Encounter (Signed)
Spoke to patient she stated she is scheduled for surgery next Tue 09/01/12 to have a fistula put in and needs to hold coumadin 3 days before.Wants to check with Dr.Wall to make sure ok to hold coumadin.

## 2012-08-24 NOTE — Telephone Encounter (Signed)
Patient called no answer.LMTC. 

## 2012-08-24 NOTE — Telephone Encounter (Signed)
plz return call to patient wk# regarding coumadin hold

## 2012-08-25 NOTE — Telephone Encounter (Signed)
Will forward to Dr. Verl Blalock for review.

## 2012-08-26 ENCOUNTER — Encounter (HOSPITAL_COMMUNITY)
Admission: RE | Admit: 2012-08-26 | Discharge: 2012-08-26 | Disposition: A | Discharge status: Home / Self Care | Payer: BC Managed Care – PPO | Source: Ambulatory Visit | Attending: Vascular Surgery | Admitting: Vascular Surgery

## 2012-08-26 ENCOUNTER — Other Ambulatory Visit: Payer: Self-pay

## 2012-08-26 ENCOUNTER — Encounter (HOSPITAL_COMMUNITY): Payer: Self-pay

## 2012-08-26 ENCOUNTER — Encounter (HOSPITAL_COMMUNITY): Payer: Self-pay | Admitting: Pharmacy Technician

## 2012-08-26 HISTORY — DX: Unspecified osteoarthritis, unspecified site: M19.90

## 2012-08-26 LAB — SURGICAL PCR SCREEN
MRSA, PCR: NEGATIVE
Staphylococcus aureus: NEGATIVE

## 2012-08-26 NOTE — Pre-Procedure Instructions (Signed)
[place]  08/26/2012   Your procedure is scheduled on:  09/01/12  Report to [place] at 730 AM.  Call this number if you have problems the morning of surgery: [phone]   Remember:   Do not eat food:After Midnight    Take these medicines the morning of surgery with A SIP OF WATER: none Do not wear jewelry, make-up or nail polish.  Do not wear lotions, powders, or perfumes. You may wear deodorant.  Do not shave 48 hours prior to surgery. Men may shave face and neck.  Do not bring valuables to the hospital.  Contacts, dentures or bridgework may not be worn into surgery.  Leave suitcase in the car. After surgery it may be brought to your room.  For patients admitted to the hospital, checkout time is 11:00 AM the day of discharge.   Patients discharged the day of surgery will not be allowed to drive home.  Name and phone number of your driver: family  Special Instructions: Shower using CHG 2 nights before surgery and the night before surgery.  If you shower the day of surgery use CHG.  Use special wash - you have one bottle of CHG for all showers.  You should use approximately 1/3 of the bottle for each shower.   Please read over the following fact sheets that you were given: Pain Booklet, Coughing and Deep Breathing, MRSA Information and Surgical Site Infection Prevention

## 2012-08-27 NOTE — Telephone Encounter (Signed)
Dr. Verl Blalock has cleared pt to stop coumadin prior to fistula surgery. She is also due appointment with Dr. Verl Blalock. appt made for 10/15/12  Pt would also like to talk with Dr. Verl Blalock about switching from coumadin ( Dr. Laurann [place] monitors this) to Xarelto. Horton Chin RN

## 2012-09-01 MED ORDER — DEXTROSE 5 % IV SOLN
1.5000 g | INTRAVENOUS | Status: DC
  Filled 2012-09-01: qty 1.5

## 2012-09-02 ENCOUNTER — Ambulatory Visit (HOSPITAL_COMMUNITY): Payer: BC Managed Care – PPO | Admitting: Anesthesiology

## 2012-09-02 ENCOUNTER — Ambulatory Visit (HOSPITAL_COMMUNITY)
Admission: RE | Admit: 2012-09-02 | Discharge: 2012-09-02 | Disposition: A | Discharge status: Home / Self Care | Payer: BC Managed Care – PPO | Source: Ambulatory Visit | Attending: Vascular Surgery | Admitting: Vascular Surgery

## 2012-09-02 ENCOUNTER — Encounter (HOSPITAL_COMMUNITY)
Admission: RE | Disposition: A | Discharge status: Home / Self Care | Payer: Self-pay | Source: Ambulatory Visit | Attending: Vascular Surgery

## 2012-09-02 ENCOUNTER — Encounter (HOSPITAL_COMMUNITY): Payer: Self-pay

## 2012-09-02 ENCOUNTER — Telehealth: Payer: Self-pay | Admitting: Vascular Surgery

## 2012-09-02 ENCOUNTER — Encounter (HOSPITAL_COMMUNITY): Payer: Self-pay | Admitting: Anesthesiology

## 2012-09-02 DIAGNOSIS — Z01812 Encounter for preprocedural laboratory examination: Secondary | ICD-10-CM | POA: Insufficient documentation

## 2012-09-02 DIAGNOSIS — N186 End stage renal disease: Secondary | ICD-10-CM

## 2012-09-02 DIAGNOSIS — Z992 Dependence on renal dialysis: Secondary | ICD-10-CM | POA: Insufficient documentation

## 2012-09-02 DIAGNOSIS — E119 Type 2 diabetes mellitus without complications: Secondary | ICD-10-CM | POA: Insufficient documentation

## 2012-09-02 DIAGNOSIS — I120 Hypertensive chronic kidney disease with stage 5 chronic kidney disease or end stage renal disease: Secondary | ICD-10-CM | POA: Insufficient documentation

## 2012-09-02 DIAGNOSIS — Z01810 Encounter for preprocedural cardiovascular examination: Secondary | ICD-10-CM | POA: Insufficient documentation

## 2012-09-02 HISTORY — PX: AV FISTULA PLACEMENT: SHX1204

## 2012-09-02 LAB — PROTIME-INR
INR: 1.52 — ABNORMAL HIGH (ref 0.00–1.49)
Prothrombin Time: 17.9 seconds — ABNORMAL HIGH (ref 11.6–15.2)

## 2012-09-02 LAB — POCT I-STAT 4, (NA,K, GLUC, HGB,HCT)
Glucose, Bld: 89 mg/dL (ref 70–99)
HCT: 36 % (ref 36.0–46.0)
Hemoglobin: 12.2 g/dL (ref 12.0–15.0)
Potassium: 4 meq/L (ref 3.5–5.1)
Sodium: 144 meq/L (ref 135–145)

## 2012-09-02 LAB — GLUCOSE, CAPILLARY
Glucose-Capillary: 87 mg/dL (ref 70–99)
Glucose-Capillary: 114 mg/dL — ABNORMAL HIGH (ref 70–99)

## 2012-09-02 LAB — APTT: aPTT: 30 seconds (ref 24–37)

## 2012-09-02 SURGERY — ARTERIOVENOUS (AV) FISTULA CREATION
Anesthesia: General | Site: Arm Lower | Laterality: Left | Wound class: Clean

## 2012-09-02 MED ORDER — SODIUM CHLORIDE 0.9 % IR SOLN
Status: DC | PRN
  Administered 2012-09-02: 09:00:00

## 2012-09-02 MED ORDER — MIDAZOLAM HCL 5 MG/5ML IJ SOLN
INTRAMUSCULAR | Status: DC | PRN
  Administered 2012-09-02: 1 mg via INTRAVENOUS

## 2012-09-02 MED ORDER — SODIUM CHLORIDE 0.9 % IV SOLN
INTRAVENOUS | Status: DC
  Administered 2012-09-02: 08:00:00 via INTRAVENOUS

## 2012-09-02 MED ORDER — CEFAZOLIN SODIUM-DEXTROSE 2-3 GM-% IV SOLR
2.0000 g | Freq: Once | INTRAVENOUS | Status: AC
  Administered 2012-09-02: 2 g via INTRAVENOUS
  Filled 2012-09-02: qty 50

## 2012-09-02 MED ORDER — 0.9 % SODIUM CHLORIDE (POUR BTL) OPTIME
TOPICAL | Status: DC | PRN
  Administered 2012-09-02: 1000 mL

## 2012-09-02 MED ORDER — HYDROMORPHONE HCL PF 1 MG/ML IJ SOLN
0.2500 mg | INTRAMUSCULAR | Status: DC | PRN
  Administered 2012-09-02: 0.25 mg via INTRAVENOUS

## 2012-09-02 MED ORDER — HYDROMORPHONE HCL PF 1 MG/ML IJ SOLN
INTRAMUSCULAR | Status: AC
  Filled 2012-09-02: qty 1

## 2012-09-02 MED ORDER — OXYCODONE HCL 5 MG PO TABS: 5.0000 mg | ORAL_TABLET | ORAL | Status: DC | PRN

## 2012-09-02 MED ORDER — HEPARIN SODIUM (PORCINE) 1000 UNIT/ML IJ SOLN
INTRAMUSCULAR | Status: DC | PRN
  Administered 2012-09-02: 5000 [IU] via INTRAVENOUS

## 2012-09-02 MED ORDER — OXYCODONE HCL 5 MG PO TABS
ORAL_TABLET | ORAL | Status: AC
  Filled 2012-09-02: qty 2

## 2012-09-02 MED ORDER — FENTANYL CITRATE 0.05 MG/ML IJ SOLN
INTRAMUSCULAR | Status: DC | PRN
  Administered 2012-09-02: 100 ug via INTRAVENOUS
  Administered 2012-09-02: 50 ug via INTRAVENOUS

## 2012-09-02 MED ORDER — OXYCODONE HCL 5 MG PO TABS: 5.0000 mg | ORAL_TABLET | Freq: Once | ORAL | Status: DC | PRN

## 2012-09-02 MED ORDER — ONDANSETRON HCL 4 MG/2ML IJ SOLN
INTRAMUSCULAR | Status: DC | PRN
  Administered 2012-09-02: 4 mg via INTRAVENOUS

## 2012-09-02 MED ORDER — OXYCODONE HCL 5 MG PO TABS
10.0000 mg | ORAL_TABLET | Freq: Once | ORAL | Status: AC
  Administered 2012-09-02: 10 mg via ORAL

## 2012-09-02 MED ORDER — SODIUM CHLORIDE 0.9 % IV SOLN: INTRAVENOUS | Status: DC

## 2012-09-02 MED ORDER — CEFAZOLIN SODIUM 1-5 GM-% IV SOLN: 1.0000 g | Freq: Once | INTRAVENOUS | Status: DC

## 2012-09-02 MED ORDER — PROMETHAZINE HCL 25 MG/ML IJ SOLN: 6.2500 mg | INTRAMUSCULAR | Status: DC | PRN

## 2012-09-02 MED ORDER — PROPOFOL 10 MG/ML IV BOLUS
INTRAVENOUS | Status: DC | PRN
  Administered 2012-09-02: 150 mg via INTRAVENOUS

## 2012-09-02 MED ORDER — LIDOCAINE HCL (CARDIAC) 20 MG/ML IV SOLN
INTRAVENOUS | Status: DC | PRN
  Administered 2012-09-02: 60 mg via INTRAVENOUS

## 2012-09-02 MED ORDER — OXYCODONE HCL 5 MG/5ML PO SOLN: 5.0000 mg | Freq: Once | ORAL | Status: DC | PRN

## 2012-09-02 MED ORDER — MEPERIDINE HCL 25 MG/ML IJ SOLN: 6.2500 mg | INTRAMUSCULAR | Status: DC | PRN

## 2012-09-02 MED ORDER — CEFAZOLIN SODIUM-DEXTROSE 2-3 GM-% IV SOLR
INTRAVENOUS | Status: AC
  Filled 2012-09-02: qty 50

## 2012-09-02 SURGICAL SUPPLY — 47 items
ADH SKN CLS APL DERMABOND .7 (GAUZE/BANDAGES/DRESSINGS) ×1
ADH SKN CLS LQ APL DERMABOND (GAUZE/BANDAGES/DRESSINGS) ×1
CANISTER SUCTION 2500CC (MISCELLANEOUS) ×2 IMPLANT
CANNULA VESSEL W/WING WO/VALVE (CANNULA) ×1 IMPLANT
CLIP TI MEDIUM 6 (CLIP) ×2 IMPLANT
CLIP TI WIDE RED SMALL 6 (CLIP) ×2 IMPLANT
CLOTH BEACON ORANGE TIMEOUT ST (SAFETY) ×2 IMPLANT
COVER PROBE W GEL 5X96 (DRAPES) ×1 IMPLANT
COVER SURGICAL LIGHT HANDLE (MISCELLANEOUS) ×2 IMPLANT
DECANTER SPIKE VIAL GLASS SM (MISCELLANEOUS) ×1 IMPLANT
DERMABOND ADHESIVE PROPEN (GAUZE/BANDAGES/DRESSINGS) ×1
DERMABOND ADVANCED (GAUZE/BANDAGES/DRESSINGS) ×1
DERMABOND ADVANCED .7 DNX12 (GAUZE/BANDAGES/DRESSINGS) ×1 IMPLANT
DERMABOND ADVANCED .7 DNX6 (GAUZE/BANDAGES/DRESSINGS) IMPLANT
DRAIN PENROSE 1/4X12 LTX STRL (WOUND CARE) ×2 IMPLANT
ELECT REM PT RETURN 9FT ADLT (ELECTROSURGICAL) ×2
ELECTRODE REM PT RTRN 9FT ADLT (ELECTROSURGICAL) ×1 IMPLANT
GAUZE SPONGE 2X2 8PLY STRL LF (GAUZE/BANDAGES/DRESSINGS) ×1 IMPLANT
GEL ULTRASOUND 20GR AQUASONIC (MISCELLANEOUS) IMPLANT
GLOVE BIO SURGEON STRL SZ7.5 (GLOVE) ×2 IMPLANT
GLOVE BIOGEL PI IND STRL 6.5 (GLOVE) IMPLANT
GLOVE BIOGEL PI IND STRL 7.0 (GLOVE) IMPLANT
GLOVE BIOGEL PI IND STRL 8 (GLOVE) IMPLANT
GLOVE BIOGEL PI INDICATOR 6.5 (GLOVE) ×1
GLOVE BIOGEL PI INDICATOR 7.0 (GLOVE) ×2
GLOVE BIOGEL PI INDICATOR 8 (GLOVE) ×1
GLOVE SURG SS PI 6.5 STRL IVOR (GLOVE) ×1 IMPLANT
GOWN PREVENTION PLUS XLARGE (GOWN DISPOSABLE) ×2 IMPLANT
GOWN STRL NON-REIN LRG LVL3 (GOWN DISPOSABLE) ×4 IMPLANT
KIT BASIN OR (CUSTOM PROCEDURE TRAY) ×2 IMPLANT
KIT ROOM TURNOVER OR (KITS) ×2 IMPLANT
LOOP VESSEL MINI RED (MISCELLANEOUS) ×1 IMPLANT
NS IRRIG 1000ML POUR BTL (IV SOLUTION) ×2 IMPLANT
PACK CV ACCESS (CUSTOM PROCEDURE TRAY) ×2 IMPLANT
PAD ARMBOARD 7.5X6 YLW CONV (MISCELLANEOUS) ×4 IMPLANT
SPONGE GAUZE 2X2 STER 10/PKG (GAUZE/BANDAGES/DRESSINGS)
SPONGE SURGIFOAM ABS GEL 100 (HEMOSTASIS) IMPLANT
SUT PROLENE 6 0 CC 1 (SUTURE) ×1 IMPLANT
SUT PROLENE 7 0 BV 1 (SUTURE) ×3 IMPLANT
SUT VIC AB 3-0 SH 27 (SUTURE) ×2
SUT VIC AB 3-0 SH 27X BRD (SUTURE) ×1 IMPLANT
SUT VICRYL 4-0 PS2 18IN ABS (SUTURE) ×2 IMPLANT
SYR BULB IRRIGATION 50ML (SYRINGE) ×1 IMPLANT
TOWEL OR 17X24 6PK STRL BLUE (TOWEL DISPOSABLE) ×2 IMPLANT
TOWEL OR 17X26 10 PK STRL BLUE (TOWEL DISPOSABLE) ×2 IMPLANT
UNDERPAD 30X30 INCONTINENT (UNDERPADS AND DIAPERS) ×2 IMPLANT
WATER STERILE IRR 1000ML POUR (IV SOLUTION) ×2 IMPLANT

## 2012-09-02 NOTE — Anesthesia Postprocedure Evaluation (Signed)
  Anesthesia Post-op Note  Patient: Brenda Arnold  Procedure(s) Performed: Procedure(s) (LRB) with comments: ARTERIOVENOUS (AV) FISTULA CREATION (Left) - Creation of Left Radial-Cephalic Fistula   Patient Location: PACU  Anesthesia Type: General  Level of Consciousness: awake  Airway and Oxygen Therapy: Patient Spontanous Breathing  Post-op Pain: mild  Post-op Assessment: Post-op Vital signs reviewed  Post-op Vital Signs: stable  Complications: No apparent anesthesia complications

## 2012-09-02 NOTE — Anesthesia Procedure Notes (Signed)
Procedure Name: LMA Insertion Date/Time: 09/02/2012 8:37 AM Performed by: Mariea Clonts Pre-anesthesia Checklist: Patient identified, Timeout performed, Emergency Drugs available, Suction available and Patient being monitored Patient Re-evaluated:Patient Re-evaluated prior to inductionOxygen Delivery Method: Circle system utilized Preoxygenation: Pre-oxygenation with 100% oxygen Intubation Type: IV induction LMA: LMA inserted LMA Size: 4.0 Number of attempts: 1 Placement Confirmation: positive ETCO2 and breath sounds checked- equal and bilateral Tube secured with: Tape Dental Injury: Teeth and Oropharynx as per pre-operative assessment

## 2012-09-02 NOTE — Discharge Instructions (Signed)
 Brenda Arnold

## 2012-09-02 NOTE — Anesthesia Preprocedure Evaluation (Signed)
Anesthesia Evaluation  Patient identified by MRN, date of birth, ID band Patient awake    Reviewed: Allergy & Precautions  History of Anesthesia Complications Negative for: history of anesthetic complications  Airway Mallampati: II  Neck ROM: Full    Dental  (+) Teeth Intact and Dental Advisory Given   Pulmonary asthma , sleep apnea ,          Cardiovascular hypertension, Rhythm:Regular Rate:Normal     Neuro/Psych    GI/Hepatic   Endo/Other  diabetes  Renal/GU ESRF and DialysisRenal disease     Musculoskeletal   Abdominal (+) + obese,   Peds  Hematology   Anesthesia Other Findings   Reproductive/Obstetrics                           Anesthesia Physical Anesthesia Plan  ASA: III  Anesthesia Plan: General   Post-op Pain Management:    Induction: Intravenous  Airway Management Planned: LMA  Additional Equipment:   Intra-op Plan:   Post-operative Plan: Extubation in OR  Informed Consent: I have reviewed the patients History and Physical, chart, labs and discussed the procedure including the risks, benefits and alternatives for the proposed anesthesia with the patient or authorized representative who has indicated his/her understanding and acceptance.   Dental advisory given  Plan Discussed with: CRNA and Surgeon  Anesthesia Plan Comments:         Anesthesia Quick Evaluation

## 2012-09-02 NOTE — Transfer of Care (Signed)
Immediate Anesthesia Transfer of Care Note  Patient: Brenda Arnold  Procedure(s) Performed: Procedure(s) (LRB) with comments: ARTERIOVENOUS (AV) FISTULA CREATION (Left) - Creation of Left Radial-Cephalic Fistula   Patient Location: PACU  Anesthesia Type: General  Level of Consciousness: awake, alert  and oriented  Airway & Oxygen Therapy: Patient Spontanous Breathing and Patient connected to nasal cannula oxygen  Post-op Assessment: Report given to PACU RN and Post -op Vital signs reviewed and stable  Post vital signs: Reviewed and stable  Complications: No apparent anesthesia complications

## 2012-09-02 NOTE — H&P (Signed)
[facility] [place]  HISTORY AND PHYSICAL  History of Present Illness: Patient is a 56 y.o. year old female who presents for placement of a permanent hemodialysis access. The patient is right handed  . The patient is not currently on hemodialysis. The cause of renal failure is thought to be secondary to multifactorial. Other chronic medical problems include . ABDs, hypertension, obesity, sleep apnea, hyperlipidemia, asthma, atrial fibrillation (requiring Coumadin).  Past Medical History   Diagnosis  Date   .  Diabetes mellitus    .  HTN (hypertension)    .  Obesity    .  Sinus infection      diagnosed November 20, 2009   .  Chronic kidney disease    .  Sleep apnea    .  Dyslipidemia    .  Asthma    .  Paroxysmal a-fib    .  Atrial fibrillation    .  Sleep apnea     Past Surgical History   Procedure  Date   .  Hysterectomy (other)    .  Uvuloplasty    .  Right knee surgery    .  Dilation and curettage of uterus    .  Abdominal hysterectomy      with panniculctomy   Social History  History   Substance Use Topics   .  Smoking status:  Never Smoker   .  Smokeless tobacco:  Never Used   .  Alcohol Use:  No   Family History  Family History   Problem  Relation  Age of Onset   .  Lung cancer  Father    .  Cancer  Father       lung    .  Hypertension  Mother    .  Diabetes  Brother    .  Kidney disease  Brother    Allergies  Allergies   Allergen  Reactions   .  Iodine    .  Shellfish Allergy     Current Outpatient Prescriptions   Medication  Sig  Dispense  Refill   .  Acetaminophen (TYLENOL EX ST ARTHRITIS PAIN PO)  Take by mouth as needed.     Marland Kitchen  aspirin (ASPIRIN EC) 81 MG EC tablet  Take 81 mg by mouth daily.     .  calcitRIOL (ROCALTROL) 0.25 MCG capsule  Take 1 capsule by mouth daily.     .  calcium carbonate (OS-CAL) 600 MG TABS  Take 600 mg by mouth daily.     .  cloNIDine (CATAPRES) 0.1 MG tablet  Take 1 tablet by mouth 2 (two) times  daily.     Marland Kitchen  docusate sodium (COLACE) 100 MG capsule  Take 100 mg by mouth at bedtime. Take 1-2 caps     .  ergocalciferol (DRISDOL) 50000 UNITS capsule  Take 50,000 Units by mouth once a week.     .  furosemide (LASIX) 40 MG tablet  TAKE 1 TABLET DAILY. PLEASEMAKE AN APPOINTMENT WITH YOUR DOCTOR  90 tablet  0   .  glimepiride (AMARYL) 2 MG tablet  Take 1 tablet by mouth daily.     Marland Kitchen  losartan (COZAAR) 100 MG tablet  TAKE 1 TABLET DAILY  90 tablet  3   .  MATZIM LA 360 MG 24 hr tablet  Take 1 tablet by mouth daily.     .  polycarbophil (FIBERCON) 625 MG tablet  Take 625 mg by mouth  daily as needed.     .  warfarin (COUMADIN) 5 MG tablet  Use as directed     ROS:  General: No weight loss, Fever, chills  HEENT: No recent headaches, no nasal bleeding, no visual changes, no sore throat  Neurologic: No dizziness, blackouts, seizures. No recent symptoms of stroke or mini- stroke. No recent episodes of slurred speech, or temporary blindness.  Cardiac: No recent episodes of chest pain/pressure, no shortness of breath at rest. + shortness of breath with exertion. + history of atrial fibrillation or irregular heartbeat  Vascular: No history of rest pain in feet. No history of claudication. No history of non-healing ulcer, No history of DVT  Pulmonary: No home oxygen, no productive cough, no hemoptysis, + asthma or wheezing  Musculoskeletal: [x ] Arthritis, [ x] Low back pain, [ ]  Joint pain  Hematologic:No history of hypercoagulable state. No history of easy bleeding. No history of anemia  Gastrointestinal: No hematochezia or melena, No gastroesophageal reflux, no trouble swallowing  Urinary: [x ] chronic Kidney disease, [ ]  on HD - [ ]  MWF or [ ]  TTHS, [ ]  Burning with urination, [ ]  Frequent urination, [ ]  Difficulty urinating;  Skin: No rashes  Psychological: No history of anxiety, No history of depression  Physical Examination   Filed Vitals:   09/02/12 0624 09/02/12 0625  BP:  187/94  Pulse:   74  Temp:  98.3 F (36.8 C)  TempSrc: Oral Oral  Resp:  20  SpO2:  96%  Body mass index is 48.74 kg/(m^2).  General: Alert and oriented, no acute distress  HEENT: Normal  Gastrointestinal: Soft, non-tender, non-distended, no mass, no scars  Skin: No rash  Extremity Pulses: 2+ radial, brachial pulses bilaterally  Musculoskeletal: No deformity or edema  Neurologic: Upper and lower extremity motor 5/5 and symmetric   DATA: Vein mapping shows the cephalic vein in the left side a greater than 3 mm in diameter throughout its course. The basilic vein is greater than 3 mm throughout its course. On the right upper extremity there similar findings greater than 3-4 mm in the cephalic and basilic veins.   ASSESSMENT: Needs hemodialysis access. Should be a reasonable candidate for a left radiocephalic AV fistula. I did discuss with her that if the radial artery is quite small we would consider a brachiocephalic fistula. We'll make this determination at the time of operation.   PLAN: Left arm AV fistula today   Ruta Hinds, MD  [facility] [place] [place]  Office: [phone]  Pager: [phone]

## 2012-09-02 NOTE — Progress Notes (Signed)
Patient is claustrophobic and would like to try pulse ox on toe because pulse ox on her finger makes her feel confined.

## 2012-09-02 NOTE — Preoperative (Signed)
Beta Blockers   Reason not to administer Beta Blockers:Not Applicable 

## 2012-09-02 NOTE — Telephone Encounter (Addendum)
Message copied by Doristine Section on Wed Sep 02, 2012 10:10 AM ------      Message from: Ruta Hinds E      Created: Wed Sep 02, 2012 10:03 AM       Left radial cephalic AVF      Roczniak asst            Needs follow up in 6 weeks            Juanda Crumble  notified patient of appt. on 10-15-12 10:00 with cef as per staff message 09-02-12

## 2012-09-02 NOTE — Op Note (Signed)
Procedure: Left Radial Cephalic AV fistula   Preop: ESRD   Postop: ESRD   Anesthesia: General  Assistant: Wray Kearns, PA-c   Findings: 3 mm cephalic vein       3 mm radial artery   Procedure Details:  The left upper extremity was prepped and draped in usual sterile fashion. A longitudinal skin incision was then made in this location at the distal left forearm. The incision was carried into the subcutaneous tissues down to level cephalic vein. The vein had some spasm but was overall reasonable quality accepting a 3 mm dilator. The vein was dissected free circumferentially and small side branches ligated and divided between silk ties. The distal end was ligated and the vein probed and found to accept up to a 3 mm dilator. This was gently distended with heparinized saline, spatulated, and marked for orientation. Next the radial artery was dissected free in the medial portion incision. The artery was 3 mm in diameter but had a reasonable pulse. The vessel loops were placed proximal and distal to the planned site of arteriotomy. The patient was given 5000 units of intravenous heparin. After appropriate circulation time, the vessel loops were used to control the artery. A longitudinal opening was made in the left radial artery. The vein was controlled proximally with a fine bulldog clamp. The vein was then swung over to the artery and sewn end of vein to side of artery using a running 7-0 Prolene suture. Just prior to completion, the anastomosis was fore bled back bled and thoroughly flushed. The anastomosis was secured, vessel loops released, and there was a palpable thrill in the fistula immediately. After hemostasis was obtained, the subcutaneous tissues were reapproximated using a running 3-0 Vicryl suture. The skin was then closed with a 4 Vicryl subcuticular stitch. Dermabond was applied to the skin incision. The patient tolerated the procedure well and there were no complications. Instrument  sponge and needle count were correct at the end of the case. The patient was taken to PACU in stable condition.   Ruta Hinds, MD  [facility] [place]  Office: [phone]  Pager: [phone]

## 2012-09-03 ENCOUNTER — Encounter (HOSPITAL_COMMUNITY): Payer: Self-pay | Admitting: Vascular Surgery

## 2012-09-09 ENCOUNTER — Telehealth: Payer: Self-pay

## 2012-09-09 NOTE — Telephone Encounter (Signed)
Phone call from pt.  Reported "some redness and warmth @ left forearm incision".  Denies any fever or chills, but stated she "used a thermometer strip and checked the temperature of the area and it was 100.2 degrees last night."  Denies any separation of incision or drainage.  Stated she had "4-5 sharp pains just below the elbow that lasted about 15 minutes last evening."  Advised pt. to continue to monitor and report any increased redness, swelling, drainage, fever or chills.  Encouraged to elevate arm when able, to reduce swelling.   Verb. understanding.

## 2012-09-24 ENCOUNTER — Other Ambulatory Visit: Payer: Self-pay | Admitting: Cardiology

## 2012-09-29 ENCOUNTER — Encounter: Payer: Self-pay | Admitting: Cardiology

## 2012-10-15 ENCOUNTER — Ambulatory Visit: Payer: BC Managed Care – PPO | Admitting: Vascular Surgery

## 2012-10-15 ENCOUNTER — Ambulatory Visit: Payer: BC Managed Care – PPO | Admitting: Cardiology

## 2012-10-16 ENCOUNTER — Ambulatory Visit (INDEPENDENT_AMBULATORY_CARE_PROVIDER_SITE_OTHER): Payer: BC Managed Care – PPO | Admitting: Cardiology

## 2012-10-16 ENCOUNTER — Encounter: Payer: Self-pay | Admitting: Cardiology

## 2012-10-16 ENCOUNTER — Other Ambulatory Visit: Payer: Self-pay | Admitting: *Deleted

## 2012-10-16 VITALS — BP 116/66 | HR 79 | Ht 66.0 in | Wt 290.0 lb

## 2012-10-16 DIAGNOSIS — I10 Essential (primary) hypertension: Secondary | ICD-10-CM

## 2012-10-16 DIAGNOSIS — Z4931 Encounter for adequacy testing for hemodialysis: Secondary | ICD-10-CM

## 2012-10-16 DIAGNOSIS — N186 End stage renal disease: Secondary | ICD-10-CM

## 2012-10-16 DIAGNOSIS — I4891 Unspecified atrial fibrillation: Secondary | ICD-10-CM

## 2012-10-16 DIAGNOSIS — R0989 Other specified symptoms and signs involving the circulatory and respiratory systems: Secondary | ICD-10-CM

## 2012-10-16 NOTE — Progress Notes (Signed)
Mrs. Brenda Arnold came today simply to ask if she could be changed from Coumadin to an anti-thrombin such as Xarelto. She is tired of having a restriction of her diet with greens and also having to have her pro time and INR checked every month.  She has progressive renal insufficiency and is very close to dialysis. After a 30 minute conversation, we decided was best to stay on Coumadin with the transitions in her healthcare that are ongoing now. I'm also concerned about increased bleeding risk with her renal dysfunction even though she could probably take a low dose of Xarelto. She agrees with this plan. We will see her back when necessary. I did not charge her for her visit today.

## 2012-10-21 ENCOUNTER — Encounter: Payer: Self-pay | Admitting: Vascular Surgery

## 2012-10-21 ENCOUNTER — Other Ambulatory Visit (HOSPITAL_COMMUNITY): Payer: Self-pay | Admitting: Family Medicine

## 2012-10-21 DIAGNOSIS — Z1231 Encounter for screening mammogram for malignant neoplasm of breast: Secondary | ICD-10-CM

## 2012-10-22 ENCOUNTER — Encounter (INDEPENDENT_AMBULATORY_CARE_PROVIDER_SITE_OTHER): Payer: BC Managed Care – PPO | Admitting: *Deleted

## 2012-10-22 ENCOUNTER — Ambulatory Visit (INDEPENDENT_AMBULATORY_CARE_PROVIDER_SITE_OTHER): Payer: BC Managed Care – PPO | Admitting: Vascular Surgery

## 2012-10-22 ENCOUNTER — Encounter: Payer: Self-pay | Admitting: Vascular Surgery

## 2012-10-22 VITALS — BP 126/68 | HR 99 | Resp 16 | Ht 66.0 in | Wt 288.0 lb

## 2012-10-22 DIAGNOSIS — N186 End stage renal disease: Secondary | ICD-10-CM

## 2012-10-22 DIAGNOSIS — Z4931 Encounter for adequacy testing for hemodialysis: Secondary | ICD-10-CM

## 2012-10-22 DIAGNOSIS — T82898A Other specified complication of vascular prosthetic devices, implants and grafts, initial encounter: Secondary | ICD-10-CM

## 2012-10-22 NOTE — Progress Notes (Signed)
Patient is a 56 year old female returns for followup today for placement of a left radiocephalic AV fistula on October 2. She denies any symptoms of numbness tingling or aching in her hand. She is currently not on dialysis. She is followed by Dr. Lorrene Reid.  Physical exam: Filed Vitals:   10/22/12 1516  BP: 126/68  Pulse: 99  Resp: 16  Height: 5\' 6"  (1.676 m)  Weight: 288 lb (130.636 kg)  SpO2: 100%   Left upper extremity: Well-healed incision, palpable thrill audible bruit in fistula. There is one large side branch laterally.  Data: Duplex of AV fistula today shows a diameter is between 5 and 7 mm. It is less than 3 mm from the skin throughout most of the course. The vein is doubled in diameter since placement of the fistula. There was some increased velocity in the proximal aspect this may be due to normal hemodynamics.  Assessment: Developing fistula left arm  Plan: Followup on as-needed basis. The patient has followup scheduled Dr. Lorrene Reid in the near future. She'll return if the fistula flow was not adequate or if Dr. Lorrene Reid wishes [place] to ligate the side branch.  Ruta Hinds, MD [facility] [place] Office: [phone] Pager: [phone]

## 2012-11-05 ENCOUNTER — Observation Stay (HOSPITAL_BASED_OUTPATIENT_CLINIC_OR_DEPARTMENT_OTHER)
Admission: EM | Admit: 2012-11-05 | Discharge: 2012-11-08 | Disposition: A | Discharge status: Home / Self Care | Payer: BC Managed Care – PPO | Attending: Internal Medicine | Admitting: Internal Medicine

## 2012-11-05 ENCOUNTER — Observation Stay (HOSPITAL_COMMUNITY): Payer: BC Managed Care – PPO

## 2012-11-05 ENCOUNTER — Encounter (HOSPITAL_COMMUNITY): Payer: Self-pay | Admitting: General Practice

## 2012-11-05 DIAGNOSIS — R001 Bradycardia, unspecified: Secondary | ICD-10-CM | POA: Diagnosis not present

## 2012-11-05 DIAGNOSIS — T45515A Adverse effect of anticoagulants, initial encounter: Secondary | ICD-10-CM

## 2012-11-05 DIAGNOSIS — E119 Type 2 diabetes mellitus without complications: Secondary | ICD-10-CM | POA: Insufficient documentation

## 2012-11-05 DIAGNOSIS — E669 Obesity, unspecified: Secondary | ICD-10-CM | POA: Insufficient documentation

## 2012-11-05 DIAGNOSIS — R58 Hemorrhage, not elsewhere classified: Secondary | ICD-10-CM

## 2012-11-05 DIAGNOSIS — Z7901 Long term (current) use of anticoagulants: Secondary | ICD-10-CM | POA: Insufficient documentation

## 2012-11-05 DIAGNOSIS — R040 Epistaxis: Principal | ICD-10-CM | POA: Insufficient documentation

## 2012-11-05 DIAGNOSIS — I498 Other specified cardiac arrhythmias: Secondary | ICD-10-CM | POA: Insufficient documentation

## 2012-11-05 DIAGNOSIS — G4733 Obstructive sleep apnea (adult) (pediatric): Secondary | ICD-10-CM | POA: Diagnosis present

## 2012-11-05 DIAGNOSIS — E785 Hyperlipidemia, unspecified: Secondary | ICD-10-CM | POA: Insufficient documentation

## 2012-11-05 DIAGNOSIS — I129 Hypertensive chronic kidney disease with stage 1 through stage 4 chronic kidney disease, or unspecified chronic kidney disease: Secondary | ICD-10-CM | POA: Insufficient documentation

## 2012-11-05 DIAGNOSIS — I10 Essential (primary) hypertension: Secondary | ICD-10-CM | POA: Diagnosis present

## 2012-11-05 DIAGNOSIS — I4891 Unspecified atrial fibrillation: Secondary | ICD-10-CM | POA: Insufficient documentation

## 2012-11-05 DIAGNOSIS — I480 Paroxysmal atrial fibrillation: Secondary | ICD-10-CM | POA: Diagnosis present

## 2012-11-05 DIAGNOSIS — N189 Chronic kidney disease, unspecified: Secondary | ICD-10-CM | POA: Insufficient documentation

## 2012-11-05 DIAGNOSIS — Z79899 Other long term (current) drug therapy: Secondary | ICD-10-CM | POA: Insufficient documentation

## 2012-11-05 HISTORY — DX: Epistaxis: R04.0

## 2012-11-05 LAB — GLUCOSE, CAPILLARY
Glucose-Capillary: 125 mg/dL — ABNORMAL HIGH (ref 70–99)
Glucose-Capillary: 127 mg/dL — ABNORMAL HIGH (ref 70–99)

## 2012-11-05 LAB — CBC WITH DIFFERENTIAL/PLATELET
Basophils Absolute: 0 10*3/uL (ref 0.0–0.1)
Basophils Relative: 0 % (ref 0–1)
Eosinophils Absolute: 0.2 10*3/uL (ref 0.0–0.7)
Eosinophils Relative: 2 % (ref 0–5)
HCT: 38.2 % (ref 36.0–46.0)
Hemoglobin: 12.4 g/dL (ref 12.0–15.0)
Lymphocytes Relative: 21 % (ref 12–46)
Lymphs Abs: 2.2 10*3/uL (ref 0.7–4.0)
MCH: 29.5 pg (ref 26.0–34.0)
MCHC: 32.5 g/dL (ref 30.0–36.0)
MCV: 91 fL (ref 78.0–100.0)
Monocytes Absolute: 0.9 10*3/uL (ref 0.1–1.0)
Monocytes Relative: 8 % (ref 3–12)
Neutro Abs: 7.2 10*3/uL (ref 1.7–7.7)
Neutrophils Relative %: 68 % (ref 43–77)
Platelets: 214 10*3/uL (ref 150–400)
RBC: 4.2 MIL/uL (ref 3.87–5.11)
RDW: 14 % (ref 11.5–15.5)
WBC: 10.5 10*3/uL (ref 4.0–10.5)

## 2012-11-05 LAB — BASIC METABOLIC PANEL
BUN: 37 mg/dL — ABNORMAL HIGH (ref 6–23)
Calcium: 9.7 mg/dL (ref 8.4–10.5)
GFR calc non Af Amer: 17 mL/min — ABNORMAL LOW (ref >90)
Glucose, Bld: 92 mg/dL (ref 70–99)
Sodium: 142 mEq/L (ref 135–145)

## 2012-11-05 LAB — PROTIME-INR
INR: 1.77 — ABNORMAL HIGH (ref 0.00–1.49)
Prothrombin Time: 20 s — ABNORMAL HIGH (ref 11.6–15.2)

## 2012-11-05 LAB — MAGNESIUM: Magnesium: 1.9 mg/dL (ref 1.5–2.5)

## 2012-11-05 LAB — BASIC METABOLIC PANEL WITH GFR
CO2: 24 meq/L (ref 19–32)
Chloride: 109 meq/L (ref 96–112)
Creatinine, Ser: 2.9 mg/dL — ABNORMAL HIGH (ref 0.50–1.10)
GFR calc Af Amer: 20 mL/min — ABNORMAL LOW (ref >90)
Potassium: 3.7 meq/L (ref 3.5–5.1)

## 2012-11-05 MED ORDER — HYDROCODONE-ACETAMINOPHEN 5-325 MG PO TABS
1.0000 | ORAL_TABLET | Freq: Four times a day (QID) | ORAL | Status: DC | PRN
  Administered 2012-11-05 – 2012-11-06 (×2): 1 via ORAL
  Filled 2012-11-05 (×2): qty 1

## 2012-11-05 MED ORDER — HYDROCODONE-ACETAMINOPHEN 5-325 MG PO TABS
2.0000 | ORAL_TABLET | Freq: Once | ORAL | Status: AC
  Administered 2012-11-05: 1 via ORAL
  Filled 2012-11-05: qty 2

## 2012-11-05 MED ORDER — CALCIUM CARBONATE-VITAMIN D 500-200 MG-UNIT PO TABS
2.0000 | ORAL_TABLET | Freq: Every day | ORAL | Status: DC
  Administered 2012-11-06 – 2012-11-08 (×3): 2 via ORAL
  Filled 2012-11-05 (×3): qty 2

## 2012-11-05 MED ORDER — CALCITRIOL 0.25 MCG PO CAPS
0.2500 ug | ORAL_CAPSULE | Freq: Every day | ORAL | Status: DC
  Administered 2012-11-05 – 2012-11-08 (×4): 0.25 ug via ORAL
  Filled 2012-11-05 (×4): qty 1

## 2012-11-05 MED ORDER — ACETAMINOPHEN 650 MG RE SUPP: 650.0000 mg | Freq: Four times a day (QID) | RECTAL | Status: DC | PRN

## 2012-11-05 MED ORDER — CLONIDINE HCL 0.1 MG PO TABS
0.1000 mg | ORAL_TABLET | Freq: Two times a day (BID) | ORAL | Status: DC
  Administered 2012-11-05 – 2012-11-08 (×6): 0.1 mg via ORAL
  Filled 2012-11-05 (×7): qty 1

## 2012-11-05 MED ORDER — SODIUM CHLORIDE 0.9 % IV SOLN: 250.0000 mL | INTRAVENOUS | Status: DC | PRN

## 2012-11-05 MED ORDER — SODIUM CHLORIDE 0.9 % IJ SOLN
3.0000 mL | Freq: Two times a day (BID) | INTRAMUSCULAR | Status: DC
  Administered 2012-11-06 – 2012-11-07 (×3): 3 mL via INTRAVENOUS

## 2012-11-05 MED ORDER — AMOXICILLIN-POT CLAVULANATE 875-125 MG PO TABS
1.0000 | ORAL_TABLET | Freq: Once | ORAL | Status: AC
  Administered 2012-11-05: 1 via ORAL
  Filled 2012-11-05: qty 1

## 2012-11-05 MED ORDER — ALUM & MAG HYDROXIDE-SIMETH 200-200-20 MG/5ML PO SUSP: 30.0000 mL | Freq: Four times a day (QID) | ORAL | Status: DC | PRN

## 2012-11-05 MED ORDER — PNEUMOCOCCAL VAC POLYVALENT 25 MCG/0.5ML IJ INJ
0.5000 mL | INJECTION | INTRAMUSCULAR | Status: AC
  Filled 2012-11-05: qty 0.5

## 2012-11-05 MED ORDER — LIDOCAINE-EPINEPHRINE 2 %-1:100000 IJ SOLN
INTRAMUSCULAR | Status: AC
  Administered 2012-11-05: 1 mL
  Filled 2012-11-05: qty 1

## 2012-11-05 MED ORDER — ONDANSETRON HCL 4 MG PO TABS: 4.0000 mg | ORAL_TABLET | Freq: Four times a day (QID) | ORAL | Status: DC | PRN

## 2012-11-05 MED ORDER — INSULIN ASPART 100 UNIT/ML ~~LOC~~ SOLN
0.0000 [IU] | Freq: Three times a day (TID) | SUBCUTANEOUS | Status: DC
  Administered 2012-11-06 (×2): 1 [IU] via SUBCUTANEOUS

## 2012-11-05 MED ORDER — CALCIUM POLYCARBOPHIL 625 MG PO TABS
1250.0000 mg | ORAL_TABLET | Freq: Every day | ORAL | Status: DC
  Administered 2012-11-06 – 2012-11-08 (×3): 1250 mg via ORAL
  Filled 2012-11-05 (×3): qty 2

## 2012-11-05 MED ORDER — AMOXICILLIN-POT CLAVULANATE 875-125 MG PO TABS
1.0000 | ORAL_TABLET | Freq: Two times a day (BID) | ORAL | Status: DC
  Administered 2012-11-05 – 2012-11-08 (×6): 1 via ORAL
  Filled 2012-11-05 (×7): qty 1

## 2012-11-05 MED ORDER — VITAMIN D-3 25 MCG (1000 UT) PO CAPS
1.0000 | ORAL_CAPSULE | Freq: Every day | ORAL | Status: DC
  Administered 2012-11-06: 1 via ORAL
  Filled 2012-11-05 (×2): qty 1

## 2012-11-05 MED ORDER — HYDROCODONE-ACETAMINOPHEN 5-325 MG PO TABS
1.0000 | ORAL_TABLET | Freq: Once | ORAL | Status: AC
  Administered 2012-11-05: 2 via ORAL
  Filled 2012-11-05: qty 2

## 2012-11-05 MED ORDER — PANTOPRAZOLE SODIUM 40 MG PO TBEC
40.0000 mg | DELAYED_RELEASE_TABLET | Freq: Every day | ORAL | Status: DC
  Administered 2012-11-06: 40 mg via ORAL
  Filled 2012-11-05: qty 1

## 2012-11-05 MED ORDER — ONDANSETRON HCL 4 MG/2ML IJ SOLN: 4.0000 mg | Freq: Four times a day (QID) | INTRAMUSCULAR | Status: DC | PRN

## 2012-11-05 MED ORDER — METHYLCELLULOSE (LAXATIVE) 500 MG PO TABS: 2.0000 | ORAL_TABLET | Freq: Every day | ORAL | Status: DC

## 2012-11-05 MED ORDER — CALCIUM CARBONATE-VITAMIN D 600-400 MG-UNIT PO TABS: 2.0000 | ORAL_TABLET | Freq: Every day | ORAL | Status: DC

## 2012-11-05 MED ORDER — PHENYLEPHRINE HCL 0.5 % NA SOLN
NASAL | Status: AC
  Administered 2012-11-05: 13:00:00
  Filled 2012-11-05: qty 15

## 2012-11-05 MED ORDER — LOSARTAN POTASSIUM 50 MG PO TABS
100.0000 mg | ORAL_TABLET | Freq: Every day | ORAL | Status: DC
  Administered 2012-11-06 – 2012-11-08 (×3): 100 mg via ORAL
  Filled 2012-11-05 (×4): qty 2

## 2012-11-05 MED ORDER — SODIUM CHLORIDE 0.9 % IJ SOLN: 3.0000 mL | INTRAMUSCULAR | Status: DC | PRN

## 2012-11-05 MED ORDER — ACETAMINOPHEN 325 MG PO TABS
650.0000 mg | ORAL_TABLET | Freq: Four times a day (QID) | ORAL | Status: DC | PRN
  Administered 2012-11-05: 650 mg via ORAL
  Filled 2012-11-05: qty 2

## 2012-11-05 MED ORDER — SODIUM CHLORIDE 0.9 % IJ SOLN
3.0000 mL | Freq: Two times a day (BID) | INTRAMUSCULAR | Status: DC
  Administered 2012-11-05 – 2012-11-07 (×3): 3 mL via INTRAVENOUS

## 2012-11-05 MED ORDER — OXYMETAZOLINE HCL 0.05 % NA SOLN
NASAL | Status: AC
  Administered 2012-11-05: 1 via NASAL
  Filled 2012-11-05: qty 15

## 2012-11-05 MED ORDER — FUROSEMIDE 40 MG PO TABS
40.0000 mg | ORAL_TABLET | Freq: Every morning | ORAL | Status: DC
  Administered 2012-11-06 – 2012-11-08 (×3): 40 mg via ORAL
  Filled 2012-11-05 (×3): qty 1

## 2012-11-05 MED ORDER — ONDANSETRON HCL 4 MG/2ML IJ SOLN: 4.0000 mg | Freq: Three times a day (TID) | INTRAMUSCULAR | Status: DC | PRN

## 2012-11-05 MED ORDER — DILTIAZEM HCL ER COATED BEADS 360 MG PO TB24
360.0000 mg | ORAL_TABLET | Freq: Every day | ORAL | Status: DC
  Administered 2012-11-06: 360 mg via ORAL
  Filled 2012-11-05 (×2): qty 1

## 2012-11-05 NOTE — ED Provider Notes (Signed)
History     CSN: [id number]  Arrival date & time 11/05/12  1224   First MD Initiated Contact with Patient 11/05/12 1234      Chief Complaint  Patient presents with  . Epistaxis    (Consider location/radiation/quality/duration/timing/severity/associated sxs/prior treatment) HPI Comments: Patient is a 56 year old female with a past medical history of diabetes, atrial fibrillation, and HTN who presents with epistaxis since 4am that started spontaneously. Patient reports the bleeding started as intermittent and has become progressively worse since the onset. Patient denies pain. She reports holding pressure on her nose to stop the bleeding which did not help. Patient takes coumadin for afib but reports missing a few doses due to airplane travel over the past week. No aggravating/alleviating factors. Patient denies SOB, chest pain, numbness/tingling, dizziness.   Patient is a 56 y.o. female presenting with nosebleeds.  Epistaxis     Past Medical History  Diagnosis Date  . Diabetes mellitus   . Obesity   . Sinus infection     diagnosed November 20, 2009   . Chronic kidney disease   . Dyslipidemia   . Asthma   . Paroxysmal a-fib   . Atrial fibrillation   . Sleep apnea     no cpcp      last study  >8 yrs  . Sleep apnea   . HTN (hypertension)     dr wall  . Arthritis     Past Surgical History  Procedure Date  . Hysterectomy (other)   . Uvuloplasty   . Right knee surgery   . Dilation and curettage of uterus   . Abdominal hysterectomy     with panniculctomy  . Av fistula placement 09/02/2012    Procedure: ARTERIOVENOUS (AV) FISTULA CREATION;  Surgeon: Elam Dutch, MD;  Location: [name] OR;  Service: Vascular;  Laterality: Left;  Creation of Left Radial-Cephalic Fistula     Family History  Problem Relation Age of Onset  . Lung cancer Father   . Cancer Father     lung  . Hypertension Mother   . Diabetes Brother   . Kidney disease Brother     History  Substance Use  Topics  . Smoking status: Never Smoker   . Smokeless tobacco: Never Used  . Alcohol Use: No    OB History    Grav Para Term Preterm Abortions TAB SAB Ect Mult Living                  Review of Systems  HENT: Positive for nosebleeds.   Gastrointestinal: Positive for nausea.  All other systems reviewed and are negative.    Allergies  Iodine and Shellfish allergy  Home Medications   Current Outpatient Rx  Name  Route  Sig  Dispense  Refill  . ACETAMINOPHEN ER 650 MG PO TBCR   Oral   Take 650 mg by mouth every 8 (eight) hours as needed. For pain         . ASPIRIN 81 MG PO TBEC   Oral   Take 81 mg by mouth daily.           Marland Kitchen BIOTIN PO   Oral   Take 1 tablet by mouth daily. 10,000 mcg with 100 mg keratin         . CORRECTOL PO   Oral   Take 3 capsules by mouth daily as needed. For constipation         . CALCITRIOL 0.25 MCG PO CAPS  Oral   Take 1 capsule by mouth daily.          Marland Kitchen CALCIUM 600 + D PO   Oral   Take 2 tablets by mouth daily.         Marland Kitchen VITAMIN D-3 1000 UNITS PO CAPS   Oral   Take 1 capsule by mouth daily.         Marland Kitchen CLONIDINE HCL 0.1 MG PO TABS   Oral   Take 1 tablet by mouth 2 (two) times daily.          Marland Kitchen DOCUSATE SODIUM 100 MG PO CAPS   Oral   Take 100 mg by mouth at bedtime. Take 1-2 caps          . ERGOCALCIFEROL 50000 UNITS PO CAPS   Oral   Take 50,000 Units by mouth once a week. On Thursdays         . FUROSEMIDE 80 MG PO TABS   Oral   Take 80 mg by mouth every morning. 1/2 tablet daily 40mg          . GLIMEPIRIDE 2 MG PO TABS   Oral   Take 2 mg by mouth daily. With 1st main meal of the day         . LOSARTAN POTASSIUM 100 MG PO TABS   Oral   Take 1 tablet (100 mg total) by mouth daily.   90 tablet   0   . MATZIM LA 360 MG PO TB24   Oral   Take 360 mg by mouth daily.          Marland Kitchen FIBER THERAPY PO   Oral   Take 2 tablets by mouth daily.         . WARFARIN SODIUM 5 MG PO TABS   Oral   Take 5  mg by mouth daily. Use as directed/ 5 mg (1 tab) on Monday thru Saturday; 2.5 mg (1/2 tab) on Sunday           BP 189/86  Pulse 97  Resp 22  Ht 5\' 7"  (1.702 m)  Wt 294 lb (133.358 kg)  BMI 46.05 kg/m2  SpO2 100%  Physical Exam  Nursing note and vitals reviewed. Constitutional: She is oriented to person, place, and time. She appears well-developed and well-nourished. No distress.  HENT:  Head: Normocephalic and atraumatic.       Nose atraumatic. Copious bleeding from left nostril.   Eyes: Conjunctivae normal and EOM are normal. Pupils are equal, round, and reactive to light.  Neck: Normal range of motion. Neck supple.  Cardiovascular: Normal rate and regular rhythm.  Exam reveals no gallop and no friction rub.   No murmur heard. Pulmonary/Chest: Effort normal and breath sounds normal. She has no wheezes. She has no rales. She exhibits no tenderness.  Abdominal: Soft. She exhibits no distension. There is no tenderness. There is no rebound and no guarding.  Musculoskeletal: Normal range of motion.  Neurological: She is alert and oriented to person, place, and time.       Speech is goal-oriented. Moves limbs without ataxia.   Skin: Skin is warm and dry. She is not diaphoretic.  Psychiatric: She has a normal mood and affect. Her behavior is normal.    ED Course  Procedures (including critical care time)  Labs Reviewed  PROTIME-INR - Abnormal; Notable for the following:    Prothrombin Time 20.0 (*)     INR 1.77 (*)     All other  components within normal limits  BASIC METABOLIC PANEL - Abnormal; Notable for the following:    BUN 37 (*)     Creatinine, Ser 2.90 (*)     GFR calc non Af Amer 17 (*)     GFR calc Af Amer 20 (*)     All other components within normal limits  CBC WITH DIFFERENTIAL   No results found.   1. Acute posterior epistaxis   2. Bleeding on Coumadin   3. DM (diabetes mellitus)   4. OSA (obstructive sleep apnea)   5. OBESITY-MORBID (>100')   6. Atrial  fibrillation       MDM  1:28 PM Patient holding pressure and tilting head back to reduce bleeding. Afrin tried to minimize bleeding which did not seem to provide relief. Anterior nasal packing used. Will reassess her shortly.   2:55 PM Patient will be admitted for posterior epistaxis.       Alvina Chou, PA-C 11/05/12 1455

## 2012-11-05 NOTE — H&P (Signed)
[facility] History and Physical  Brenda Arnold [id number] DOB: 10-28-1956 DOA: 11/05/2012  Referring physician: Dr Mitzi Hansen PCP: Osborne Casco, MD  Specialists: ENT pending: Dr Constance Holster  Chief Complaint: nose bleed  HPI: Brenda Arnold is a 56 y.o. female with PMHx of CKD with AVF, DM, afib on chronic coumadin therapy, hyperlipidemia, OSA who prented to ED with several hour history of epistaxisis of left nare. Patient states that she got back from [facility] one day prior to admission. Patient stated that at 4 AM on the morning of admission she workup and fell she had a runny nose and tried to wipe it and noticed she had blood on her shirt and on the chest. Patient stated that the bleeding lasted approximately 10 minutes and subsequently subsided. Patient went to bed and patient woke up around 6 AM with further bleeding that lasted about 5-10 minutes. Around 8 AM patient had an episode of recurrent bleeding that lasted 20 minutes. Patient stated that the bleeding subsided she got ready for work and handed off to work. Around noon on the day of admission just prior to signing then she blew her nose and had numerous clots of blood with ongoing bleeding from the left knee. Patient called her PCP and was instructed to present to the ED as she was on chronic Coumadin therapy. Patient presented to the emergency department had nasal packing placement anteriorly however bleeding persisted down the posterior pharynx and anterior packing was removed and anterior-posterior rapid Rhino 7.5 cm balloon was placed. Vasoactive agents such as oxymetazoline, phenylephrine, lidocaine with epi were tried however failed to stop the bleeding. It was felt patient's bleeding was likely a posterior source of bleeding. Patient was given a dose of Augmentin and she was transferred to [facility] for further evaluation and management. Patient denies any fevers, no chills, no chest pain, no  vomiting, no cough, no abdominal pain. Patient endorses some generalized weakness and chronic constipation. Patient denies picking her nose and denies any significant dry atmosphere. Patient denies any syncope. Patient denies any dizziness. Emergency room a CBC which was obtained discharge hemoglobin of 12.4. Basic metabolic profile obtained did have a BUN of 37 a creatinine of 2.90.  Review of Systems: The patient denies anorexia, fever, weight loss,, vision loss, decreased hearing, hoarseness, chest pain, syncope, dyspnea on exertion, peripheral edema, balance deficits, hemoptysis, abdominal pain, melena, hematochezia, severe indigestion/heartburn, hematuria, incontinence, genital sores, muscle weakness, suspicious skin lesions, transient blindness, difficulty walking, depression, unusual weight change, abnormal bleeding, enlarged lymph nodes, angioedema, and breast masses.   Past Medical History  Diagnosis Date  . Diabetes mellitus   . Obesity   . Sinus infection     diagnosed November 20, 2009   . Chronic kidney disease   . Dyslipidemia   . Asthma   . Paroxysmal a-fib   . Atrial fibrillation   . Sleep apnea     no cpcp      last study  >8 yrs  . Sleep apnea   . HTN (hypertension)     dr wall  . Arthritis   . Bleeding from the nose 11/05/2012  . CKD (chronic kidney disease) 11/05/2012   Past Surgical History  Procedure Date  . Hysterectomy (other)   . Uvuloplasty   . Right knee surgery   . Dilation and curettage of uterus   . Abdominal hysterectomy     with panniculctomy  . Av fistula placement 09/02/2012    Procedure: ARTERIOVENOUS (  AV) FISTULA CREATION;  Surgeon: Elam Dutch, MD;  Location: [facility] OR;  Service: Vascular;  Laterality: Left;  Creation of Left Radial-Cephalic Fistula    Social History:  reports that she has never smoked. She has never used smokeless tobacco. She reports that she does not drink alcohol or use illicit drugs.  Allergies  Allergen Reactions  .  Iodine Shortness Of Breath  . Shellfish Allergy Shortness Of Breath    Family History  Problem Relation Age of Onset  . Lung cancer Father   . Cancer Father     lung  . Hypertension Mother   . Diabetes Brother   . Kidney disease Brother     Prior to Admission medications   Medication Sig Start Date End Date Taking? Authorizing Provider  acetaminophen (TYLENOL ARTHRITIS PAIN) 650 MG CR tablet Take 650 mg by mouth every 8 (eight) hours as needed. For pain   Yes Historical Provider, MD  aspirin (ASPIRIN EC) 81 MG EC tablet Take 81 mg by mouth daily.     Yes Historical Provider, MD  Bisacodyl (CORRECTOL PO) Take 1-2 capsules by mouth daily as needed. For constipation   Yes Historical Provider, MD  calcitRIOL (ROCALTROL) 0.25 MCG capsule Take 1 capsule by mouth daily.  08/27/11  Yes Historical Provider, MD  Calcium Carbonate-Vitamin D (CALCIUM 600 + D PO) Take 2 tablets by mouth daily.   Yes Historical Provider, MD  Cholecalciferol (VITAMIN D-3) 1000 UNITS CAPS Take 1 capsule by mouth daily.   Yes Historical Provider, MD  cloNIDine (CATAPRES) 0.1 MG tablet Take 1 tablet by mouth 2 (two) times daily.  08/09/11  Yes Historical Provider, MD  ergocalciferol (DRISDOL) 50000 UNITS capsule Take 50,000 Units by mouth once a week. On Thursdays   Yes Historical Provider, MD  furosemide (LASIX) 80 MG tablet Take 40 mg by mouth every morning. 1/2 tablet of 80 mg daily   Yes Historical Provider, MD  glimepiride (AMARYL) 2 MG tablet Take 2 mg by mouth daily. With 1st main meal of the day 06/16/11  Yes Historical Provider, MD  losartan (COZAAR) 100 MG tablet Take 1 tablet (100 mg total) by mouth daily. 09/24/12  Yes Renella Cunas, MD  MATZIM LA 360 MG 24 hr tablet Take 360 mg by mouth daily.  08/27/11  Yes Historical Provider, MD  Methylcellulose, Laxative, (FIBER THERAPY PO) Take 2 tablets by mouth daily.   Yes Historical Provider, MD  warfarin (COUMADIN) 5 MG tablet Take 5 mg by mouth daily.  08/22/11  Yes  Historical Provider, MD   Physical Exam: Filed Vitals:   11/05/12 1324 11/05/12 1330 11/05/12 1503 11/05/12 1700  BP:  152/70 156/66 156/81  Pulse: 81 81 70 83  Temp:  99 F (37.2 C)  99 F (37.2 C)  TempSrc:  Oral    Resp:  20 16 18   Height:      Weight:      SpO2: 98% 96% 97% 96%     General:  Obese, well-developed well-nourished in no acute cardiopulmonary distress. Left nasal packing in place and soaked in blood. No acute bleeding noted.  Eyes: Pupils equal round and reactive to light and accommodation. Extraocular movements intact.  ENT: Oropharynx is clear, no lesions, no exudates.  Neck: Supple with no lymphadenopathy.  Cardiovascular: Regular rate rhythm no murmurs rubs or gallops. No JVD. No lower extremity edema. Left upper extremity fistula with good thrill.  Respiratory: Clear to auscultation bilaterally. No wheezing. No crackles. No rhonchi.  Abdomen: Obese, soft, nontender, nondistended, positive bowel sounds.  Skin: No rashes or lesions.  Musculoskeletal: 5/5 bilateral upper extremity strength. 5 out of 5 bilateral lower extremity strength.  Psychiatric: Normal mood. Normal affect. Good insight. Good judgment.  Neurologic: Alert and oriented x3. CN 2 through 12 are grossly intact. No focal deficits.  Labs on Admission:  Basic Metabolic Panel:  Lab [id number] 1250  NA 142  K 3.7  CL 109  CO2 24  GLUCOSE 92  BUN 37*  CREATININE 2.90*  CALCIUM 9.7  MG --  PHOS --   Liver Function Tests: No results found for this basename: AST:5,ALT:5,ALKPHOS:5,BILITOT:5,PROT:5,ALBUMIN:5 in the last 168 hours No results found for this basename: LIPASE:5,AMYLASE:5 in the last 168 hours No results found for this basename: AMMONIA:5 in the last 168 hours CBC:  Lab 11/05/12 1250  WBC 10.5  NEUTROABS 7.2  HGB 12.4  HCT 38.2  MCV 91.0  PLT 214   Cardiac Enzymes: No results found for this basename: CKTOTAL:5,CKMB:5,CKMBINDEX:5,TROPONINI:5 in the last 168  hours  BNP (last 3 results) No results found for this basename: PROBNP:3 in the last 8760 hours CBG:  Lab 11/05/12 1555  GLUCAP 125*    Radiological Exams on Admission: No results found.  EKG: None  Assessment/Plan Principal Problem:  *Acute posterior epistaxis Active Problems:  OBESITY  HYPERTENSION  Atrial fibrillation  Left-sided epistaxis  DM (diabetes mellitus)  OSA (obstructive sleep apnea)  CKD (chronic kidney disease)  #1 acute posterior epistaxis Questionable etiology. Patient on chronic Coumadin therapy with INR of 1.77. Patient is status post vasoactive agents of accessory with failure to stop bleeding. Patient is status post anterior nasal packing with persistent bleeding. Patient  With no improvement in bleeding on vasoactive agents per ED. Patient's status post anterior posterior packing with improvement in bleeding. H&H is stable. We'll place on Augmentin orally. Will consult with ENT, Dr. Constance Holster will see patient in consultation. Supportive care. Will hold Coumadin.  #2 atrial fibrillation Continue diltiazem for rate control. Will hold Coumadin secondary to problem #1.  #3 hypertension Continue home regimen of clonidine, Lasix, Cozaar, diltiazem.  #4 chronic kidney disease status post fistula placement Stable. Continue Cozaar, Lasix, vitamin D, calcitriol. Followup with nephrology as outpatient.  #5 type 2 diabetes mellitus Check a hemoglobin A1c. Hold oral hypoglycemic agents. Sliding scale insulin.  #6 prophylaxis PPI for GI prophylaxis. SCDs for DVT prophylaxis.     Code Status: Full Family Communication: Updated patient. No family present. Disposition Plan: Admit to observation  Time spent: [facility] Pager [phone]  If 7PM-7AM, please contact night-coverage [email or web address] Password [email or web address] 11/05/2012, 7:14 PM

## 2012-11-05 NOTE — ED Notes (Signed)
MD Bonk at bedside to reeval pt

## 2012-11-05 NOTE — ED Notes (Signed)
Transferred to [facility] at this time by [facility]

## 2012-11-05 NOTE — ED Notes (Signed)
MD and EDPA at bedside to assess pt- 5.5 cm anterior rapid rhino placed left nare by EDP Bonk

## 2012-11-05 NOTE — ED Notes (Signed)
Reports intermittent nosebleed since 0400- bleeding freely at present

## 2012-11-05 NOTE — ED Notes (Signed)
Rapid rhino 7.5cm ant-post packing applied by EDP Bonk

## 2012-11-05 NOTE — Progress Notes (Signed)
  56/F with PMH of DM, afib on coumadin, OSA, obesity, CKD not on HD,  Presented to ER with posterior epistaxis Initially anterior nares packed then bleeding persisted and then localised to posterior nares, EDP packed posterior nares, bleeding has subsided,  they discussed with ENT, Dr.ROsen, recommended observation Anticoagulation was not reversed, INR 1.7 Hb stable 12.4, vitals stable, accepted to Team 10/observation  Domenic Polite, MD [phone]

## 2012-11-05 NOTE — ED Notes (Signed)
The patient's CBG was 125.

## 2012-11-05 NOTE — ED Provider Notes (Signed)
Medical screening examination/treatment/procedure(s) were conducted as a shared visit with non-physician practitioner(s) and myself.  I personally evaluated the patient during the encounter Brenda Arnold, M.D.  [name] is a 56 y.o. female history of Coumadin therapy for paroxysmal atrial fibrillation also diabetes mellitus, obstructive sleep apnea, chronic kidney disease status post fistula placement the left upper extremity for future dialysis presents with epistaxis from the left nares, this is been severe, ongoing since about 4:00 this morning. Patient says she's been traveling recently with lots of airplane flights. She has tried pressure and tissue to stop the bleeding however will not stop. She says she's skipped a couple doses of Coumadin possibly.  Patient denies any dizziness, lightheadedness, chest pain or shortness of breath.  VITAL SIGNS:   Filed Vitals:   11/05/12 1503  BP: 156/66  Pulse: 70  Temp:   Resp: 16   CONSTITUTIONAL: Awake, oriented, appears non-toxic HENT: Atraumatic, normocephalic, oral mucosa pink and moist, airway patent. Nares patent, bleeding freely through the left naris. Blood in the posterior pharynx running down the throat. No obvious anterior source of bleeding. External ears normal. EYES: Conjunctiva clear, EOMI, PERRLA NECK: Trachea midline, non-tender, supple CARDIOVASCULAR: Normal heart rate, Normal rhythm, No murmurs, rubs, gallops PULMONARY/CHEST: Clear to auscultation, no rhonchi, wheezes, or rales. Symmetrical breath sounds. Non-tender. ABDOMINAL: Non-distended, morbidly obese, soft, non-tender - no rebound or guarding.  BS normal. NEUROLOGIC: Non-focal, moving all four extremities, no gross sensory or motor deficits. EXTREMITIES: No clubbing, cyanosis, or edema SKIN: Warm, Dry, No erythema, No rash  Procedures Nasal packing placement anterior: 5.5 cm rapid Rhino was soaked in nasal saline briefly then inserted along the floor the  patient's nasal canal and inflated. Bleeding persisted down the posterior pharynx - removed anterior packing and placed anterior/posterior rapid Rhino 7.5 cm balloon.  [id number] R [name] is a 57 y.o. female presenting with epistaxis on Coumadin. Patient's INR is 1.77. Vasoactive agents oxymetazoline, phenylephrine and lidocaine with epinephrine all failed to stop the bleeding. Anterior packing was placed however she still had bleeding-likely posterior source of bleeding.  Placed an ant/posterior packing.  Start the patient on Augmentin.  Discussed case with Dr. Olga Coaster.  Discussed patient with hospitalist Dr. Broadus John to be admitted on telemetry for admission while packing is in place.      Brenda Croft, MD 11/05/12 1541

## 2012-11-06 ENCOUNTER — Encounter (HOSPITAL_COMMUNITY): Payer: Self-pay | Admitting: Nurse Practitioner

## 2012-11-06 DIAGNOSIS — R001 Bradycardia, unspecified: Secondary | ICD-10-CM | POA: Diagnosis not present

## 2012-11-06 DIAGNOSIS — G4733 Obstructive sleep apnea (adult) (pediatric): Secondary | ICD-10-CM

## 2012-11-06 DIAGNOSIS — I498 Other specified cardiac arrhythmias: Secondary | ICD-10-CM

## 2012-11-06 LAB — BASIC METABOLIC PANEL
BUN: 53 mg/dL — ABNORMAL HIGH (ref 6–23)
Calcium: 9.4 mg/dL (ref 8.4–10.5)
Creatinine, Ser: 2.96 mg/dL — ABNORMAL HIGH (ref 0.50–1.10)
GFR calc Af Amer: 19 mL/min — ABNORMAL LOW (ref >90)
GFR calc non Af Amer: 17 mL/min — ABNORMAL LOW (ref >90)
Glucose, Bld: 127 mg/dL — ABNORMAL HIGH (ref 70–99)

## 2012-11-06 LAB — GLUCOSE, CAPILLARY
Glucose-Capillary: 113 mg/dL — ABNORMAL HIGH (ref 70–99)
Glucose-Capillary: 122 mg/dL — ABNORMAL HIGH (ref 70–99)
Glucose-Capillary: 141 mg/dL — ABNORMAL HIGH (ref 70–99)
Glucose-Capillary: 198 mg/dL — ABNORMAL HIGH (ref 70–99)
Glucose-Capillary: 137 mg/dL — ABNORMAL HIGH (ref 70–99)
Glucose-Capillary: 106 mg/dL — ABNORMAL HIGH (ref 70–99)

## 2012-11-06 LAB — CBC
HCT: 33.7 % — ABNORMAL LOW (ref 36.0–46.0)
HCT: 32.6 % — ABNORMAL LOW (ref 36.0–46.0)
HCT: 32.4 % — ABNORMAL LOW (ref 36.0–46.0)
Hemoglobin: 10.6 g/dL — ABNORMAL LOW (ref 12.0–15.0)
Hemoglobin: 10.5 g/dL — ABNORMAL LOW (ref 12.0–15.0)
Hemoglobin: 10.2 g/dL — ABNORMAL LOW (ref 12.0–15.0)
MCH: 28.6 pg (ref 26.0–34.0)
MCH: 29.3 pg (ref 26.0–34.0)
MCH: 28.6 pg (ref 26.0–34.0)
MCHC: 31.5 g/dL (ref 30.0–36.0)
MCHC: 32.2 g/dL (ref 30.0–36.0)
MCHC: 31.5 g/dL (ref 30.0–36.0)
MCV: 91.1 fL (ref 78.0–100.0)
MCV: 91.1 fL (ref 78.0–100.0)
MCV: 90.8 fL (ref 78.0–100.0)
Platelets: 219 10*3/uL (ref 150–400)
Platelets: 221 10*3/uL (ref 150–400)
Platelets: 210 10*3/uL (ref 150–400)
RBC: 3.7 MIL/uL — ABNORMAL LOW (ref 3.87–5.11)
RBC: 3.58 MIL/uL — ABNORMAL LOW (ref 3.87–5.11)
RBC: 3.57 MIL/uL — ABNORMAL LOW (ref 3.87–5.11)
RDW: 14.1 % (ref 11.5–15.5)
RDW: 13.9 % (ref 11.5–15.5)
RDW: 13.9 % (ref 11.5–15.5)
WBC: 12.9 10*3/uL — ABNORMAL HIGH (ref 4.0–10.5)
WBC: 12.4 10*3/uL — ABNORMAL HIGH (ref 4.0–10.5)
WBC: 13.2 10*3/uL — ABNORMAL HIGH (ref 4.0–10.5)

## 2012-11-06 LAB — PROTIME-INR
INR: 1.95 — ABNORMAL HIGH (ref 0.00–1.49)
Prothrombin Time: 21.5 seconds — ABNORMAL HIGH (ref 11.6–15.2)

## 2012-11-06 LAB — URINALYSIS, ROUTINE W REFLEX MICROSCOPIC
Bilirubin Urine: NEGATIVE
Glucose, UA: NEGATIVE mg/dL
Ketones, ur: NEGATIVE mg/dL
Leukocytes, UA: NEGATIVE
Nitrite: NEGATIVE
Protein, ur: 100 mg/dL — AB
Specific Gravity, Urine: 1.016 (ref 1.005–1.030)
Urobilinogen, UA: 0.2 mg/dL (ref 0.0–1.0)
pH: 5.5 (ref 5.0–8.0)

## 2012-11-06 LAB — HEMOGLOBIN A1C
Hgb A1c MFr Bld: 5.6 % (ref <5.7)
Mean Plasma Glucose: 114 mg/dL (ref <117)

## 2012-11-06 LAB — BASIC METABOLIC PANEL WITH GFR
CO2: 22 meq/L (ref 19–32)
Chloride: 107 meq/L (ref 96–112)
Potassium: 4 meq/L (ref 3.5–5.1)
Sodium: 142 meq/L (ref 135–145)

## 2012-11-06 LAB — URINE MICROSCOPIC-ADD ON

## 2012-11-06 MED ORDER — DILTIAZEM HCL ER COATED BEADS 120 MG PO CP24
120.0000 mg | ORAL_CAPSULE | Freq: Every day | ORAL | Status: DC
  Administered 2012-11-07 – 2012-11-08 (×2): 120 mg via ORAL
  Filled 2012-11-06 (×3): qty 1

## 2012-11-06 MED ORDER — HYDROCODONE-ACETAMINOPHEN 5-325 MG PO TABS
1.0000 | ORAL_TABLET | Freq: Four times a day (QID) | ORAL | Status: DC | PRN
  Administered 2012-11-06: 2 via ORAL
  Filled 2012-11-06: qty 2

## 2012-11-06 MED ORDER — OXYMETAZOLINE HCL 0.05 % NA SOLN
2.0000 | NASAL | Status: AC
  Administered 2012-11-07: 2 via NASAL
  Filled 2012-11-06: qty 15

## 2012-11-06 MED ORDER — HYDROCODONE-ACETAMINOPHEN 5-325 MG PO TABS
1.0000 | ORAL_TABLET | ORAL | Status: DC | PRN
  Administered 2012-11-06 – 2012-11-07 (×3): 1 via ORAL
  Administered 2012-11-07: 2 via ORAL
  Administered 2012-11-07: 1 via ORAL
  Filled 2012-11-06: qty 1
  Filled 2012-11-06: qty 2
  Filled 2012-11-06 (×3): qty 1

## 2012-11-06 MED ORDER — HYDROCODONE-ACETAMINOPHEN 5-325 MG PO TABS: 1.0000 | ORAL_TABLET | Freq: Four times a day (QID) | ORAL | Status: DC | PRN

## 2012-11-06 MED ORDER — SODIUM CHLORIDE 0.9 % IV SOLN: 250.0000 mL | INTRAVENOUS | Status: DC | PRN

## 2012-11-06 MED ORDER — SENNOSIDES-DOCUSATE SODIUM 8.6-50 MG PO TABS
2.0000 | ORAL_TABLET | Freq: Every day | ORAL | Status: DC
  Administered 2012-11-06 – 2012-11-07 (×2): 2 via ORAL
  Filled 2012-11-06 (×3): qty 2

## 2012-11-06 MED ORDER — VITAMIN K1 10 MG/ML IJ SOLN
5.0000 mg | Freq: Once | INTRAVENOUS | Status: AC
  Administered 2012-11-06: 5 mg via INTRAVENOUS
  Filled 2012-11-06: qty 0.5

## 2012-11-06 MED ORDER — DILTIAZEM HCL ER COATED BEADS 360 MG PO TB24: 360.0000 mg | ORAL_TABLET | Freq: Every day | ORAL | Status: DC

## 2012-11-06 NOTE — Progress Notes (Signed)
[facility] PROGRESS NOTE  Brenda Arnold [id number] DOB: January 09, 1956 DOA: 11/05/2012 PCP: Osborne Casco, MD  Assessment/Plan:  acute posterior epistaxis  Patient with Cammy Copa in place ENT, Dr. Constance Holster, has seen the patient and will see her in the office on Tuesday to remove packing Coumadin held for now.  Unfortunately INR went up today (to 1.95).  Will give Vit K 5 mg IV. Monitor CBC On empiric augmentin.  Acute blood loss anemia Secondary to epistaxis and IVF CBC q 8 hours x 3.  Transfuse if necessary  atrial fibrillation  Episode of bradycardia (30s) today.  [facility] Consulted. Bradycardia could have been opiate induced (patient with pain from nasal packing) Continue diltiazem for rate control but add hold parameters. Will hold Coumadin secondary to problem #1.   hypertension  Continue home regimen of clonidine, Lasix, Cozaar, diltiazem.   chronic kidney disease status post fistula placement  Uncertain of baseline creatinine.  Currently stable. Continue Cozaar, Lasix, vitamin D, calcitriol. Followup with nephrology as outpatient.   type 2 diabetes mellitus  hemoglobin A1c is 5.6. Hold oral hypoglycemic agents. Sliding scale insulin sensitive.   prophylaxis  SCDs for DVT prophylaxis.    Code Status: full Family Communication: Disposition Plan: to home when able.   Consultants:  ENT, Dr. Constance Holster  Cardiology  Procedures:    Antibiotics:  Augmentin  HPI/Subjective: 56 year old female on Coumadin for A. Fib, also with history of CKD with fistula and DM developed epistaxis at 4 AM December 5.  She has no previous history of epistaxis.  Her INR at the time of admission was 1.77. 12/6 she complains of headache from the nasal packing and ongoing constipation.  She reports transient bleeding this morning through the nasal packing.   Objective: Filed Vitals:   11/05/12 1700 11/05/12 2154 11/06/12 0508 11/06/12 1000  BP: 156/81  166/94 148/79 163/83  Pulse: 83 70 67 94  Temp: 99 F (37.2 C) 98.3 F (36.8 C) 97.3 F (36.3 C)   TempSrc:   Oral   Resp: 18 18 18    Height:      Weight:   130.182 kg (287 lb)   SpO2: 96% 95% 92%     Intake/Output Summary (Last 24 hours) at 11/06/12 1141 Last data filed at 11/06/12 0800  Gross per 24 hour  Intake    363 ml  Output      0 ml  Net    363 ml   Filed Weights   11/05/12 1230 11/06/12 0508  Weight: 133.358 kg (294 lb) 130.182 kg (287 lb)    Exam:   General:  Alert and oriented sitting up in the chair, Rhino Rocket in place,  soaked dark red.  Cardiovascular: regular rate and rhythm without murmurs rubs or gallops. At approximately 1045 this morning the patient's pulse rate dropped into the 30s and she had a 2.8 second pause.  Respiratory: clear to auscultation no wheezes crackles or rales, no accessory muscle use  Abdomen: obese, soft, nontender, nondistended, with good bowel sounds  Extremities: Can move all 4, no lower extremity edema.  Data Reviewed: Basic Metabolic Panel:  Lab [id number] 0518 11/05/12 2002 11/05/12 1250  NA 142 -- 142  K 4.0 -- 3.7  CL 107 -- 109  CO2 22 -- 24  GLUCOSE 127* -- 92  BUN 53* -- 37*  CREATININE 2.96* -- 2.90*  CALCIUM 9.4 -- 9.7  MG -- 1.9 --  PHOS -- -- --   CBC:  Lab 11/06/12 0518  11/05/12 1250  WBC 12.9* 10.5  NEUTROABS -- 7.2  HGB 10.6* 12.4  HCT 33.7* 38.2  MCV 91.1 91.0  PLT 219 214   CBG:  Lab 11/06/12 0733 11/05/12 2156 11/05/12 1555  GLUCAP 113* 127* 125*      Studies: X-ray Chest Pa And Lateral   11/06/2012  *RADIOLOGY REPORT*  Clinical Data: Epistaxis, diabetes, hypertension  CHEST - 2 VIEW  Comparison: Chest x-ray of 08/26/2012  Findings: No active infiltrate or effusion is seen.  The heart is mildly enlarged and stable.  There are degenerative change is diffusely throughout the thoracic spine.  IMPRESSION: No active lung disease.  Stable mild cardiomegaly.   Original Report Authenticated  By: Ivar Drape, M.D.     Scheduled Meds:   . [COMPLETED] amoxicillin-clavulanate  1 tablet Oral Once  . amoxicillin-clavulanate  1 tablet Oral Q12H  . calcitRIOL  0.25 mcg Oral Daily  . calcium-vitamin D  2 tablet Oral Daily  . cloNIDine  0.1 mg Oral BID  . diltiazem  360 mg Oral Daily  . furosemide  40 mg Oral q morning - 10a  . [COMPLETED] HYDROcodone-acetaminophen  1-2 tablet Oral Once  . [COMPLETED] HYDROcodone-acetaminophen  2 tablet Oral Once  . insulin aspart  0-9 Units Subcutaneous TID WC  . [COMPLETED] lidocaine-EPINEPHrine      . losartan  100 mg Oral Daily  . [COMPLETED] oxymetazoline      . pantoprazole  40 mg Oral Q0600  . [COMPLETED] phenylephrine      . pneumococcal 23 valent vaccine  0.5 mL Intramuscular Tomorrow-1000  . polycarbophil  1,250 mg Oral Daily  . senna-docusate  2 tablet Oral QHS  . sodium chloride  3 mL Intravenous Q12H  . sodium chloride  3 mL Intravenous Q12H  . Vitamin D-3  1 capsule Oral Daily  . [DISCONTINUED] Calcium Carbonate-Vitamin D  2 tablet Oral Daily  . [DISCONTINUED] Methylcellulose (Laxative)  2 tablet Oral Daily   Continuous Infusions:   Principal Problem:  *Acute posterior epistaxis Active Problems:  OBESITY  HYPERTENSION  Atrial fibrillation  Left-sided epistaxis  DM (diabetes mellitus)  OSA (obstructive sleep apnea)  CKD (chronic kidney disease)    Time spent: 30 min.    Melton Alar  [facility] Pager [phone]. If 8PM-8AM, please contact night-coverage at [email or web address], password [facility] 11/06/2012, 11:41 AM  LOS: 1 day

## 2012-11-06 NOTE — Progress Notes (Signed)
Pt c/o right nare and left nare bleeding despite packing in the left nare. Pt had quarter size blood spot on wet wash cloth when RN arrived to room. Pt wiped nose multiple times following blood on cloth and no blood was on cloth. Pt c/o pain and was given pain med per order. Will continue to monitor at this time.

## 2012-11-06 NOTE — Consult Note (Signed)
CARDIOLOGY CONSULT NOTE  Patient ID: Brenda Arnold MRN: [id number], DOB/AGE: 05-22-1956   Admit date: 11/05/2012 Date of Consult: 11/06/2012   Primary Physician: Osborne Casco, MD Nephrologist: C. Lorrene Reid, MD Primary Cardiologist: T. Wall, MD  Pt. Profile  56 y/o female with h/o paf on chronic coumadin who presented with epistaxis and has been found to have intermittent bradycardia.  Problem List  Past Medical History  Diagnosis Date  . Diabetes mellitus   . Obesity   . Dyslipidemia   . Asthma   . Paroxysmal a-fib     a. chronic coumadin;  b. 12/2009 Echo: EF 60-65%, Gr 1 DD.  Marland Kitchen Sleep apnea     a. not using CPAP, last study  >8 yrs  . Sleep apnea   . HTN (hypertension)   . Arthritis   . Epistaxis 11/05/2012  . CKD (chronic kidney disease), stage IV     Past Surgical History  Procedure Date  . Hysterectomy (other)   . Uvuloplasty   . Right knee surgery   . Dilation and curettage of uterus   . Abdominal hysterectomy     with panniculctomy  . Av fistula placement 09/02/2012    Procedure: ARTERIOVENOUS (AV) FISTULA CREATION;  Surgeon: Elam Dutch, MD;  Location: [facility] OR;  Service: Vascular;  Laterality: Left;  Creation of Left Radial-Cephalic Fistula     Allergies  Allergies  Allergen Reactions  . Iodine Shortness Of Breath  . Shellfish Allergy Shortness Of Breath   HPI   56 y/o female with the above problem list.  She is on chronic coumadin for h/o PAF, though hasn't had any afib/palpitations recently.  Yesterday morning she awoke @ 4 AM with what she presumed to be a runny nose.  She got up to blow it and when she turned on the light, noted that she had a bloody nose, from the left nare.  She held pressure and this resolved in about 5 mins.  She went back to bed and when she awoke later, around 8 am, she had recurrence of epistaxis, that time lasting 20 mins.  She went into work around noon and felt very congested.  She blew her nose and had recurrent  bleeding from the left nare.  It was more profuse than on the prior episodes and was not resolving.  She called her PCP and was advised to present to the ED for evaluation.  She initially presented to the [facility] where she packed and treated with vasoactive agents  Bleeding source was felt to be posterior and she was transferred to [facility] for ENT eval.  INR was 1.77 yesterday.  She was seen by ENT who rec packing x 5 days.  She has not had any bleeding overnight.  Here, she has been found to brady down into the 30's with pauses up to 2.67 seconds.  Both bradycardia and pauses have been asymptomatic.  She has been dozing off frequently in the setting of pain medication and apneic spells with bradycardia has been noted in that setting.  She denies pnd, orthopnea, n, v, dizziness, syncope, edema, weight gain, chest pain, or early satiety.  Inpatient Medications     . [COMPLETED] amoxicillin-clavulanate  1 tablet Oral Once  . amoxicillin-clavulanate  1 tablet Oral Q12H  . calcitRIOL  0.25 mcg Oral Daily  . calcium-vitamin D  2 tablet Oral Daily  . cloNIDine  0.1 mg Oral BID  . diltiazem  360 mg Oral Daily  .  furosemide  40 mg Oral q morning - 10a  . [COMPLETED] HYDROcodone-acetaminophen  1-2 tablet Oral Once  . [COMPLETED] HYDROcodone-acetaminophen  2 tablet Oral Once  . insulin aspart  0-9 Units Subcutaneous TID WC  . [COMPLETED] lidocaine-EPINEPHrine      . losartan  100 mg Oral Daily  . [COMPLETED] oxymetazoline      . [COMPLETED] phenylephrine      . phytonadione (VITAMIN K) IV  5 mg Intravenous Once  . pneumococcal 23 valent vaccine  0.5 mL Intramuscular Tomorrow-1000  . polycarbophil  1,250 mg Oral Daily  . senna-docusate  2 tablet Oral QHS  . sodium chloride  3 mL Intravenous Q12H  . sodium chloride  3 mL Intravenous Q12H  . Vitamin D-3  1 capsule Oral Daily  . [DISCONTINUED] Calcium Carbonate-Vitamin D  2 tablet Oral Daily  . [DISCONTINUED] Methylcellulose (Laxative)  2 tablet  Oral Daily  . [DISCONTINUED] pantoprazole  40 mg Oral Q0600   Family History Family History  Problem Relation Age of Onset  . Lung cancer Father     died @ 67  . Hypertension Mother     alive @ 50  . Diabetes Brother   . Kidney disease Brother     Social History History   Social History  . Marital Status: Single    Spouse Name: N/A    Number of Children: N/A  . Years of Education: N/A   Occupational History  . Not on file.   Social History Main Topics  . Smoking status: Never Smoker   . Smokeless tobacco: Never Used  . Alcohol Use: No  . Drug Use: No  . Sexually Active: Not on file   Other Topics Concern  . Not on file   Social History Narrative   Lives in [place] alone. 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    Review of Systems  General:  No chills, fever, night sweats or weight changes.  Cardiovascular:  No chest pain, dyspnea on exertion, edema, orthopnea, palpitations, paroxysmal nocturnal dyspnea. Dermatological: No rash, lesions/masses Respiratory: No cough, dyspnea Urologic: No hematuria, dysuria Abdominal:   No nausea, vomiting, diarrhea, bright red blood per rectum, melena, or hematemesis Neurologic:  No visual changes, wkns, changes in mental status. HEENT: epistaxis - l nare with pain/headache s/p packing. All other systems reviewed and are otherwise negative except as noted above.  Physical Exam  Blood pressure 163/83, pulse 94, temperature 97.3 F (36.3 C), temperature source Oral, resp. rate 18, height 5\' 7"  (1.702 m), weight 287 lb (130.182 kg), SpO2 92.00%.  General: Pleasant, NAD Psych: Normal affect. Neuro: Alert and oriented X 3. Moves all extremities spontaneously. HEENT: Normal except packing to left nare.  Neck: Supple without bruits or JVD. Lungs:  Resp regular and unlabored, CTA. Heart: RRR no s3, s4, or murmurs. Abdomen: Soft, non-tender, non-distended, BS + x 4.  Extremities: No clubbing, cyanosis.  Trace  bilat LE edema. DP/PT/Radials 2+ and equal bilaterally.  Labs  Lab Results  Component Value Date   WBC 12.9* 11/06/2012   HGB 10.6* 11/06/2012   HCT 33.7* 11/06/2012   MCV 91.1 11/06/2012   PLT 219 11/06/2012    Lab 11/06/12 0518  NA 142  K 4.0  CL 107  CO2 22  BUN 53*  CREATININE 2.96*  CALCIUM 9.4  PROT --  BILITOT --  ALKPHOS --  ALT --  AST --  GLUCOSE 127*   Lab Results  Component Value Date   INR  1.95* 11/06/2012   INR 1.77* 11/05/2012   INR 1.52* 09/02/2012   Radiology/Studies  X-ray Chest Pa And Lateral   11/06/2012  *RADIOLOGY REPORT*  Clinical Data: Epistaxis, diabetes, hypertension  CHEST - 2 VIEW  Comparison: Chest x-ray of 08/26/2012  Findings: No active infiltrate or effusion is seen.  The heart is mildly enlarged and stable.  There are degenerative change is diffusely throughout the thoracic spine.  IMPRESSION: No active lung disease.  Stable mild cardiomegaly.   Original Report Authenticated By: Ivar Drape, M.D.    ECG  Sinus bradycardia, 39, LAD, no acute st/t changes.  ASSESSMENT AND PLAN  1.  Sinus Bradycardia:  Asymptomatic.  Longest pause 2.67 seconds.  Pt has had witnessed apneic episodes this AM.  Suspect OSA playing major role in bradycardia and pauses.  Rec repeat outpt sleep study with CPAP placement at some point.  She reports claustrophobia in the past with the full face mask but may be able to tolerate a nasal pillow, though not in the short term in setting of epistaxis.  We will reduce her diltiazem dose to 120mg  daily for the time being.  2.  PAF:  In sinus.  Coumadin on hold until directed otw by ENT.  Reducing Dilt as above.  3.  OSA:  See discussion in #1.    4.  HTN:  Follow BP with reduced dose CCB.  Pressures have been elevated in house in setting of headaches/nasal pain.  May need to titrate clonidine further.  5.  CKD IV:  S/p left radial AVF.  Followed by nephrology as an outpt.  Creat stable.  6.  Morbid Obesity:  Would benefit  from dietary counseling and weight loss.  Signed, Murray Hodgkins, NP 11/06/2012, 11:52 AM The patient was evaluated.  Agree with the history and physical as noted above.  The patient is markedly obese.  She is in no distress except for pain related to her nasal packing.  As I was examining her she fell asleep several times and as she fell asleep her pulse would decrease markedly.  She appears to have significant obstructive sleep apnea.  Her morbid obesity he is playing a significant role in her obstructive sleep apnea and she would benefit from dietary counseling in this regard.  At this point we will reduce but not stop her diltiazem and observe response in regard to her sinus bradycardia.  Warfarin on hold because of her significant epistaxis.  Depending on clinical course we will need to consider whether to restart warfarin at some point in the future or just use low-dose aspirin.  She is Chadss 2 (diabetes and hypertension).

## 2012-11-06 NOTE — Progress Notes (Signed)
12/6 Spoke with patient about her diabetes. Has been on oral medication for her diabetes.  Takes Amaryl 2 mg daily. States that CBGs at home run on the "lower" side, (80, 110 mg/dl) checks CBGs at home 3 or 4 times a week.  Sees Dr. Kelton Pillar as her PCP.  Her HgbA1C is 5.6% on 11/05/12.  States that it usually runs about that level.  Patient was in pain with headache when I was talking to her.  Would suggest decreasing the Amaryl to 1 mg daily and having her blood sugars followed by her PCP.  She does have some chronic kidney issues, so it is hard to say how this may affect her blood sugars.  She may be having low blood sugars at home without symptoms and this may signify the low HgbA1C. Will continue to follow while in hospital. Harvel Ricks RN BSN CDE

## 2012-11-06 NOTE — Progress Notes (Signed)
Patient seen and examined this morning. Noted for episode of  bradycardia to 30s with short pauses. Appreciate cardiology evaluation.. Consider this to be related to OSA. Reduced dose of Cardizem. Recommend outpt sleep study.  Holding coumadin for now and to be addressed as outpatient. Agree, her CHADS2 is only 2 and can be switched to aspirin only .  monitor in tele

## 2012-11-06 NOTE — Progress Notes (Addendum)
Pt's HR decreasing to mid-30's & 40's sustaining.  Patient had 2.8 sec pause.  MD notified.  Will continue to monitor closely.  Lina Sar  Patient had 3.17 sec pause; asymptomatic  Cardiology NP and hospitalist notified. Will continue to monitor. Lina Sar

## 2012-11-06 NOTE — Consult Note (Signed)
Reason for Consult:Nosebleed Referring Physician: Hospitalist Service  Brenda Arnold is an 56 y.o. female.  HPI: Patient on Coumadin, had intense nosebleeds over the past couple of days. [place] [facility]. She was packed yesterday, and has had minimal bleeding since then. When the bleeding started, it was anterior and posterior. No prior history of nosebleeds.  Past Medical History  Diagnosis Date  . Diabetes mellitus   . Obesity   . Sinus infection     diagnosed November 20, 2009   . Chronic kidney disease   . Dyslipidemia   . Asthma   . Paroxysmal a-fib   . Atrial fibrillation   . Sleep apnea     no cpcp      last study  >8 yrs  . Sleep apnea   . HTN (hypertension)     dr wall  . Arthritis   . Bleeding from the nose 11/05/2012  . CKD (chronic kidney disease) 11/05/2012    Past Surgical History  Procedure Date  . Hysterectomy (other)   . Uvuloplasty   . Right knee surgery   . Dilation and curettage of uterus   . Abdominal hysterectomy     with panniculctomy  . Av fistula placement 09/02/2012    Procedure: ARTERIOVENOUS (AV) FISTULA CREATION;  Surgeon: Elam Dutch, MD;  Location: [facility] OR;  Service: Vascular;  Laterality: Left;  Creation of Left Radial-Cephalic Fistula     Family History  Problem Relation Age of Onset  . Lung cancer Father   . Cancer Father     lung  . Hypertension Mother   . Diabetes Brother   . Kidney disease Brother     Social History:  reports that she has never smoked. She has never used smokeless tobacco. She reports that she does not drink alcohol or use illicit drugs.  Allergies:  Allergies  Allergen Reactions  . Iodine Shortness Of Breath  . Shellfish Allergy Shortness Of Breath    Medications: I have reviewed the patient's current medications.  Results for orders placed during the hospital encounter of 11/05/12 (from the past 48 hour(s))  PROTIME-INR     Status: Abnormal   Collection Time   11/05/12 12:50 PM      Component Value Range Comment   Prothrombin Time 20.0 (*) 11.6 - 15.2 seconds    INR 1.77 (*) 0.00 - 1.49   CBC WITH DIFFERENTIAL     Status: Normal   Collection Time   11/05/12 12:50 PM      Component Value Range Comment   WBC 10.5  4.0 - 10.5 K/uL    RBC 4.20  3.87 - 5.11 MIL/uL    Hemoglobin 12.4  12.0 - 15.0 g/dL    HCT 38.2  36.0 - 46.0 %    MCV 91.0  78.0 - 100.0 fL    MCH 29.5  26.0 - 34.0 pg    MCHC 32.5  30.0 - 36.0 g/dL    RDW 14.0  11.5 - 15.5 %    Platelets 214  150 - 400 K/uL    Neutrophils Relative 68  43 - 77 %    Neutro Abs 7.2  1.7 - 7.7 K/uL    Lymphocytes Relative 21  12 - 46 %    Lymphs Abs 2.2  0.7 - 4.0 K/uL    Monocytes Relative 8  3 - 12 %    Monocytes Absolute 0.9  0.1 - 1.0 K/uL  Eosinophils Relative 2  0 - 5 %    Eosinophils Absolute 0.2  0.0 - 0.7 K/uL    Basophils Relative 0  0 - 1 %    Basophils Absolute 0.0  0.0 - 0.1 K/uL   BASIC METABOLIC PANEL     Status: Abnormal   Collection Time   11/05/12 12:50 PM      Component Value Range Comment   Sodium 142  135 - 145 mEq/L    Potassium 3.7  3.5 - 5.1 mEq/L    Chloride 109  96 - 112 mEq/L    CO2 24  19 - 32 mEq/L    Glucose, Bld 92  70 - 99 mg/dL    BUN 37 (*) 6 - 23 mg/dL    Creatinine, Ser 2.90 (*) 0.50 - 1.10 mg/dL    Calcium 9.7  8.4 - 10.5 mg/dL    GFR calc non Af Amer 17 (*) >90 mL/min    GFR calc Af Amer 20 (*) >90 mL/min   GLUCOSE, CAPILLARY     Status: Abnormal   Collection Time   11/05/12  3:55 PM      Component Value Range Comment   Glucose-Capillary 125 (*) 70 - 99 mg/dL    Comment 1 Notify RN      Comment 2 Documented in Chart     MAGNESIUM     Status: Normal   Collection Time   11/05/12  8:02 PM      Component Value Range Comment   Magnesium 1.9  1.5 - 2.5 mg/dL   HEMOGLOBIN A1C     Status: Normal   Collection Time   11/05/12  8:02 PM      Component Value Range Comment   Hemoglobin A1C 5.6  <5.7 %    Mean Plasma Glucose 114  <117 mg/dL   GLUCOSE,  CAPILLARY     Status: Abnormal   Collection Time   11/05/12  9:56 PM      Component Value Range Comment   Glucose-Capillary 127 (*) 70 - 99 mg/dL    Comment 1 Notify RN     URINALYSIS, ROUTINE W REFLEX MICROSCOPIC     Status: Abnormal   Collection Time   11/05/12 11:54 PM      Component Value Range Comment   Color, Urine YELLOW  YELLOW    APPearance CLEAR  CLEAR    Specific Gravity, Urine 1.016  1.005 - 1.030    pH 5.5  5.0 - 8.0    Glucose, UA NEGATIVE  NEGATIVE mg/dL    Hgb urine dipstick SMALL (*) NEGATIVE    Bilirubin Urine NEGATIVE  NEGATIVE    Ketones, ur NEGATIVE  NEGATIVE mg/dL    Protein, ur 100 (*) NEGATIVE mg/dL    Urobilinogen, UA 0.2  0.0 - 1.0 mg/dL    Nitrite NEGATIVE  NEGATIVE    Leukocytes, UA NEGATIVE  NEGATIVE   URINE MICROSCOPIC-ADD ON     Status: Abnormal   Collection Time   11/05/12 11:54 PM      Component Value Range Comment   Squamous Epithelial / LPF RARE  RARE    WBC, UA 0-2  <3 WBC/hpf    RBC / HPF 3-6  <3 RBC/hpf    Bacteria, UA RARE  RARE    Casts HYALINE CASTS (*) NEGATIVE   BASIC METABOLIC PANEL     Status: Abnormal   Collection Time   11/06/12  5:18 AM      Component Value Range  Comment   Sodium 142  135 - 145 mEq/L    Potassium 4.0  3.5 - 5.1 mEq/L    Chloride 107  96 - 112 mEq/L    CO2 22  19 - 32 mEq/L    Glucose, Bld 127 (*) 70 - 99 mg/dL    BUN 53 (*) 6 - 23 mg/dL    Creatinine, Ser 2.96 (*) 0.50 - 1.10 mg/dL    Calcium 9.4  8.4 - 10.5 mg/dL    GFR calc non Af Amer 17 (*) >90 mL/min    GFR calc Af Amer 19 (*) >90 mL/min   CBC     Status: Abnormal   Collection Time   11/06/12  5:18 AM      Component Value Range Comment   WBC 12.9 (*) 4.0 - 10.5 K/uL    RBC 3.70 (*) 3.87 - 5.11 MIL/uL    Hemoglobin 10.6 (*) 12.0 - 15.0 g/dL    HCT 33.7 (*) 36.0 - 46.0 %    MCV 91.1  78.0 - 100.0 fL    MCH 28.6  26.0 - 34.0 pg    MCHC 31.5  30.0 - 36.0 g/dL    RDW 14.1  11.5 - 15.5 %    Platelets 219  150 - 400 K/uL   PROTIME-INR     Status:  Abnormal   Collection Time   11/06/12  5:18 AM      Component Value Range Comment   Prothrombin Time 21.5 (*) 11.6 - 15.2 seconds    INR 1.95 (*) 0.00 - 1.49   GLUCOSE, CAPILLARY     Status: Abnormal   Collection Time   11/06/12  7:33 AM      Component Value Range Comment   Glucose-Capillary 113 (*) 70 - 99 mg/dL    Comment 1 Notify RN       X-ray Chest Pa And Lateral   11/06/2012  *RADIOLOGY REPORT*  Clinical Data: Epistaxis, diabetes, hypertension  CHEST - 2 VIEW  Comparison: Chest x-ray of 08/26/2012  Findings: No active infiltrate or effusion is seen.  The heart is mildly enlarged and stable.  There are degenerative change is diffusely throughout the thoracic spine.  IMPRESSION: No active lung disease.  Stable mild cardiomegaly.   Original Report Authenticated By: Ivar Drape, M.D.     ROS: Otherwise negative  Blood pressure 148/79, pulse 67, temperature 97.3 F (36.3 C), temperature source Oral, resp. rate 18, height 5\' 7"  (1.702 m), weight 287 lb (130.182 kg), SpO2 92.00%.  PHYSICAL EXAM: Overall appearance:  Healthy appearing, in no distress Head:  Normocephalic, atraumatic. Ears: External ears are healthy. Nose: External nose is healthy in appearance. Inflatable packing in the left nasal cavity, discolored from old blood very there is no active bleeding. Oral Cavity:  There are no mucosal lesions or masses identified. Neuro:  No identifiable neurologic deficits. Neck: No palpable neck masses.  Studies Reviewed: None  Assessment/Plan: Severe epistaxis, treated with anterior/posterior pack. No bleeding since the packing. Continue antibiotics, the packing in place for 5 days. I will see her on Tuesday to remove the packing, I can see her as an outpatient. She is to contact me sooner if she has any significant bleeding.  Brenda Arnold 11/06/2012, 9:17 AM

## 2012-11-06 NOTE — Progress Notes (Signed)
Pt awoke from nap with sensation of blood trickling down the back of her throat.  Patient started bleeding from right nare and coughed up a quarter-sized clot.  MD notified.  Patient applying pressure to area.  Will continue to monitor. Brenda Arnold

## 2012-11-07 LAB — GLUCOSE, CAPILLARY
Glucose-Capillary: 100 mg/dL — ABNORMAL HIGH (ref 70–99)
Glucose-Capillary: 94 mg/dL (ref 70–99)
Glucose-Capillary: 81 mg/dL (ref 70–99)
Glucose-Capillary: 105 mg/dL — ABNORMAL HIGH (ref 70–99)

## 2012-11-07 LAB — CBC
HCT: 30.5 % — ABNORMAL LOW (ref 36.0–46.0)
HCT: 29.6 % — ABNORMAL LOW (ref 36.0–46.0)
Hemoglobin: 9.7 g/dL — ABNORMAL LOW (ref 12.0–15.0)
Hemoglobin: 9.6 g/dL — ABNORMAL LOW (ref 12.0–15.0)
MCH: 29 pg (ref 26.0–34.0)
MCH: 29.5 pg (ref 26.0–34.0)
MCHC: 31.8 g/dL (ref 30.0–36.0)
MCHC: 32.4 g/dL (ref 30.0–36.0)
MCV: 91 fL (ref 78.0–100.0)
MCV: 91.1 fL (ref 78.0–100.0)
Platelets: 213 10*3/uL (ref 150–400)
Platelets: 195 10*3/uL (ref 150–400)
RBC: 3.35 MIL/uL — ABNORMAL LOW (ref 3.87–5.11)
RBC: 3.25 MIL/uL — ABNORMAL LOW (ref 3.87–5.11)
RDW: 14.1 % (ref 11.5–15.5)
RDW: 13.9 % (ref 11.5–15.5)
WBC: 14.4 10*3/uL — ABNORMAL HIGH (ref 4.0–10.5)
WBC: 11.6 10*3/uL — ABNORMAL HIGH (ref 4.0–10.5)

## 2012-11-07 LAB — BASIC METABOLIC PANEL WITH GFR
BUN: 47 mg/dL — ABNORMAL HIGH (ref 6–23)
CO2: 25 meq/L (ref 19–32)
Calcium: 9.7 mg/dL (ref 8.4–10.5)
Glucose, Bld: 94 mg/dL (ref 70–99)
Potassium: 3.9 meq/L (ref 3.5–5.1)
Sodium: 140 meq/L (ref 135–145)

## 2012-11-07 LAB — BASIC METABOLIC PANEL
Chloride: 106 mEq/L (ref 96–112)
Creatinine, Ser: 2.97 mg/dL — ABNORMAL HIGH (ref 0.50–1.10)
GFR calc Af Amer: 19 mL/min — ABNORMAL LOW (ref >90)
GFR calc non Af Amer: 17 mL/min — ABNORMAL LOW (ref >90)

## 2012-11-07 LAB — PROTIME-INR
INR: 1.31 (ref 0.00–1.49)
Prothrombin Time: 16 seconds — ABNORMAL HIGH (ref 11.6–15.2)

## 2012-11-07 LAB — URINE CULTURE
Colony Count: NO GROWTH
Culture: NO GROWTH

## 2012-11-07 MED ORDER — CHOLECALCIFEROL 10 MCG (400 UNIT) PO TABS
400.0000 [IU] | ORAL_TABLET | Freq: Every day | ORAL | Status: DC
  Administered 2012-11-07 – 2012-11-08 (×2): 400 [IU] via ORAL
  Filled 2012-11-07 (×2): qty 1

## 2012-11-07 NOTE — Progress Notes (Signed)
Pt was awaken from sleep and stated she had blood running down her throat. Pt's VS stable. Pt also had a 3.17 second pause. Pt SR/SB on the monitor.  Pt denied any dizziness. Md on call made aware. New order received. Will cont to monitor pt.

## 2012-11-07 NOTE — Progress Notes (Signed)
[facility] PROGRESS NOTE  Brenda Arnold [id number] DOB: 09/21/1956 DOA: 11/05/2012 PCP: Brenda Casco, MD  Brief narrative:  56 y.o. female with PMHx of CKD with AVF, DM, afib on chronic coumadin therapy, hyperlipidemia, OSA who prented to ED with several hour history of epistaxisis of left nose.   Assessment/Plan:  acute posterior epistaxis  Patient with Rhino Rocket in place  ENT, Dr. Constance Arnold, has seen the patient and will see her in the office on Tuesday to remove packing  Coumadin held for now. INR coming down. Per cardiology will need to be back on coumadin once cleared by ENT Monitor h&h On empiric augmentin.     atrial fibrillation  Episode of bradycardia down to 30s with frequent short pauses . [facility] Consulted and recommend this to be relate to her severe  sleep  apnea .  reduced dose of cardizem  have discussed with pulmonary over the phone for using full face CPAP.recommended to try CPAP with titration of the pressure ( as she can breathe only through one nostril) and see if it helps.   Acute blood loss anemia  Secondary to epistaxis and IVF  Monitor serial H&H   hypertension  Continue home regimen of clonidine, Lasix, Cozaar, diltiazem.   chronic kidney disease status post fistula placement Uncertain of baseline creatinine.  Last creatinine before admission few moths back was >3 4  Currently stable. Continue Cozaar, Lasix, vitamin D, calcitriol. Followup with nephrology as outpatient.   type 2 diabetes mellitus  hemoglobin A1c is 5.6. Hold oral hypoglycemic agents. Sliding scale insulin   DVT prophylaxis  SCDs      Code Status: full code Family Communication: None at bedside Disposition Plan: Home once heart rate stable with outpatient ENT followup   Consultants:  Brenda Arnold  ENT ( Brenda Arnold)  Procedures:  Left nasal packing on 12/6  Antibiotics:  augmentin ( >>12/5)  HPI/Subjective Still bradycardic  off and on the 40s short pauses. He should also had some blood trickling down her right neck last evening. She informs of having the sleep apnea and being on CPAP at home but not compliant do to being claustrophobic.  Objective: Filed Vitals:   11/06/12 1000 11/06/12 1400 11/06/12 2100 11/07/12 0500  BP: 163/83 149/73 143/44 152/59  Pulse: 94 73 68 83  Temp:  98.5 F (36.9 C) 98.7 F (37.1 C) 97.6 F (36.4 C)  TempSrc:  Oral Oral Axillary  Resp:  18    Height:      Weight:    124.286 kg (274 lb)  SpO2:  100% 96% 100%    Intake/Output Summary (Last 24 hours) at 11/07/12 1041 Last data filed at 11/07/12 0700  Gross per 24 hour  Intake    700 ml  Output    700 ml  Net      0 ml   Filed Weights   11/05/12 1230 11/06/12 0508 11/07/12 0500  Weight: 133.358 kg (294 lb) 130.182 kg (287 lb) 124.286 kg (274 lb)    Exam:   General:  Middle aged obese female in NAD  HEENT: No pallor, and rocket in place over the left nostril, no bleeding noted   Cardiovascular: Normal S1 and S2, no murmurs  Respiratory: clear b/l, no added sounds  Abdomen: soft, N,T ND, BS+  Ext: warm, no edema  CNS: AAOX3  Data Reviewed: Basic Metabolic Panel:  Lab [id number] 0440 11/06/12 0518 11/05/12 2002 11/05/12 1250  NA 140 142 -- 142  K 3.9 4.0 --  3.7  CL 106 107 -- 109  CO2 25 22 -- 24  GLUCOSE 94 127* -- 92  BUN 47* 53* -- 37*  CREATININE 2.97* 2.96* -- 2.90*  CALCIUM 9.7 9.4 -- 9.7  MG -- -- 1.9 --  PHOS -- -- -- --   Liver Function Tests: No results found for this basename: AST:5,ALT:5,ALKPHOS:5,BILITOT:5,PROT:5,ALBUMIN:5 in the last 168 hours No results found for this basename: LIPASE:5,AMYLASE:5 in the last 168 hours No results found for this basename: AMMONIA:5 in the last 168 hours CBC:  Lab 11/07/12 0440 11/06/12 2022 11/06/12 1210 11/06/12 0518 11/05/12 1250  WBC 14.4* 13.2* 12.4* 12.9* 10.5  NEUTROABS -- -- -- -- 7.2  HGB 9.7* 10.2* 10.5* 10.6* 12.4  HCT 30.5* 32.4* 32.6*  33.7* 38.2  MCV 91.0 90.8 91.1 91.1 91.0  PLT 213 210 221 219 214   Cardiac Enzymes: No results found for this basename: CKTOTAL:5,CKMB:5,CKMBINDEX:5,TROPONINI:5 in the last 168 hours BNP (last 3 results) No results found for this basename: PROBNP:3 in the last 8760 hours CBG:  Lab 11/07/12 0738 11/06/12 2043 11/06/12 1654 11/06/12 1613 11/06/12 1611  GLUCAP 100* 106* 137* 141* 198*    Recent Results (from the past 240 hour(s))  URINE CULTURE     Status: Normal   Collection Time   11/05/12 11:55 PM      Component Value Range Status Comment   Specimen Description URINE, RANDOM   Final    Special Requests NONE   Final    Culture  Setup Time 11/06/2012 00:24   Final    Colony Count NO GROWTH   Final    Culture NO GROWTH   Final    Report Status 11/07/2012 FINAL   Final      Studies: X-ray Chest Pa And Lateral   11/06/2012  *RADIOLOGY REPORT*  Clinical Data: Epistaxis, diabetes, hypertension  CHEST - 2 VIEW  Comparison: Chest x-ray of 08/26/2012  Findings: No active infiltrate or effusion is seen.  The heart is mildly enlarged and stable.  There are degenerative change is diffusely throughout the thoracic spine.  IMPRESSION: No active lung disease.  Stable mild cardiomegaly.   Original Report Authenticated By: Brenda Arnold, M.D.     Scheduled Meds:   . amoxicillin-clavulanate  1 tablet Oral Q12H  . calcitRIOL  0.25 mcg Oral Daily  . calcium-vitamin D  2 tablet Oral Daily  . cloNIDine  0.1 mg Oral BID  . diltiazem  120 mg Oral Daily  . furosemide  40 mg Oral q morning - 10a  . insulin aspart  0-9 Units Subcutaneous TID WC  . losartan  100 mg Oral Daily  . [COMPLETED] oxymetazoline  2 spray Right Nare NOW  . [COMPLETED] phytonadione (VITAMIN K) IV  5 mg Intravenous Once  . [EXPIRED] pneumococcal 23 valent vaccine  0.5 mL Intramuscular Tomorrow-1000  . polycarbophil  1,250 mg Oral Daily  . senna-docusate  2 tablet Oral QHS  . sodium chloride  3 mL Intravenous Q12H  . sodium  chloride  3 mL Intravenous Q12H  . Vitamin D-3  1 capsule Oral Daily  . [DISCONTINUED] diltiazem  360 mg Oral Daily  . [DISCONTINUED] diltiazem  360 mg Oral Daily  . [DISCONTINUED] pantoprazole  40 mg Oral Q0600   Continuous Infusions:     Time spent: [place], [place]  [facility] Pager [phone]. If 8PM-8AM, please contact night-coverage at [email or web address], password [facility] 11/07/2012, 10:41 AM  LOS: 2 days

## 2012-11-07 NOTE — Progress Notes (Signed)
Patient refused cpap tonight. RT will continue to monitor. 

## 2012-11-07 NOTE — Progress Notes (Signed)
Patient Name: Brenda Arnold      SUBJECTIVE: anticoagulated with coumadin for afib presenting wi epistaxis; INR 1.77; has CKD  Past Medical History  Diagnosis Date  . Diabetes mellitus   . Obesity   . Dyslipidemia   . Asthma   . Paroxysmal a-fib     a. chronic coumadin;  b. 12/2009 Echo: EF 60-65%, Gr 1 DD.  Marland Kitchen Sleep apnea     a. not using CPAP, last study  >8 yrs  . Sleep apnea   . HTN (hypertension)   . Arthritis   . Epistaxis 11/05/2012  . CKD (chronic kidney disease), stage IV     PHYSICAL EXAM Filed Vitals:   11/06/12 1000 11/06/12 1400 11/06/12 2100 11/07/12 0500  BP: 163/83 149/73 143/44 152/59  Pulse: 94 73 68 83  Temp:  98.5 F (36.9 C) 98.7 F (37.1 C) 97.6 F (36.4 C)  TempSrc:  Oral Oral Axillary  Resp:  18    Height:      Weight:    274 lb (124.286 kg)  SpO2:  100% 96% 100%   Well developed and nourished in no acute distress HENT normal Neck supple with JVP-flat Carotids brisk and full without bruits Clear Regular rate and rhythm, no murmurs or gallops Abd-soft with active BS without hepatomegaly No Clubbing cyanosis edema Skin-warm and dry A & Oriented  Grossly normal sensory and motor function  TELEMETRY: Reviewed telemetry pt in nsr    Intake/Output Summary (Last 24 hours) at 11/07/12 0925 Last data filed at 11/07/12 0700  Gross per 24 hour  Intake    700 ml  Output    700 ml  Net      0 ml    LABS: Basic Metabolic Panel:  Lab [id number] 0440 11/06/12 0518 11/05/12 2002 11/05/12 1250  NA 140 142 -- 142  K 3.9 4.0 -- 3.7  CL 106 107 -- 109  CO2 25 22 -- 24  GLUCOSE 94 127* -- 92  BUN 47* 53* -- 37*  CREATININE 2.97* 2.96* -- 2.90*  CALCIUM 9.7 9.4 -- --  MG -- -- 1.9 --  PHOS -- -- -- --   Cardiac Enzymes: No results found for this basename: CKTOTAL:3,CKMB:3,CKMBINDEX:3,TROPONINI:3 in the last 72 hours CBC:  Lab 11/07/12 0440 11/06/12 2022 11/06/12 1210 11/06/12 0518 11/05/12 1250  WBC 14.4* 13.2* 12.4* 12.9* 10.5    NEUTROABS -- -- -- -- 7.2  HGB 9.7* 10.2* 10.5* 10.6* 12.4  HCT 30.5* 32.4* 32.6* 33.7* 38.2  MCV 91.0 90.8 91.1 91.1 91.0  PLT 213 210 221 219 214   PROTIME:  Basename 11/07/12 0440 11/06/12 0518 11/05/12 1250  LABPROT 16.0* 21.5* 20.0*  INR 1.31 1.95* 1.77*   Liver Function Tests: No results found for this basename: AST:2,ALT:2,ALKPHOS:2,BILITOT:2,PROT:2,ALBUMIN:2 in the last 72 hours No results found for this basename: LIPASE:2,AMYLASE:2 in the last 72 hours BNP: BNP (last 3 results) No results found for this basename: PROBNP:3 in the last 8760 hours D-Dimer: No results found for this basename: DDIMER:2 in the last 72 hours Hemoglobin A1C:  Basename 11/05/12 2002  HGBA1C 5.6     ASSESSMENT AND PLAN:  Patient Active Hospital Problem List: Acute posterior epistaxis (11/05/2012)   Atrial fibrillation (12/20/2009)   DM (diabetes mellitus) (11/05/2012)   OSA (obstructive sleep apnea) (11/05/2012)   CKD (chronic kidney disease) (11/05/2012)   Bradycardia likely 2./2 OSA  Will continue to follow;  Will need anticoagulation again when ok with ENT Dr Viona Gilmore has decided aagainst  NOAC        Signed, Virl Axe MD  11/07/2012

## 2012-11-08 LAB — BASIC METABOLIC PANEL
BUN: 47 mg/dL — ABNORMAL HIGH (ref 6–23)
Calcium: 9.5 mg/dL (ref 8.4–10.5)
Creatinine, Ser: 3.21 mg/dL — ABNORMAL HIGH (ref 0.50–1.10)
GFR calc Af Amer: 18 mL/min — ABNORMAL LOW (ref >90)

## 2012-11-08 LAB — BASIC METABOLIC PANEL WITH GFR
CO2: 26 meq/L (ref 19–32)
Chloride: 108 meq/L (ref 96–112)
GFR calc non Af Amer: 15 mL/min — ABNORMAL LOW (ref >90)
Glucose, Bld: 90 mg/dL (ref 70–99)
Potassium: 3.7 meq/L (ref 3.5–5.1)
Sodium: 141 meq/L (ref 135–145)

## 2012-11-08 LAB — CBC
HCT: 28.4 % — ABNORMAL LOW (ref 36.0–46.0)
Hemoglobin: 8.9 g/dL — ABNORMAL LOW (ref 12.0–15.0)
MCH: 28.6 pg (ref 26.0–34.0)
MCHC: 31.3 g/dL (ref 30.0–36.0)
MCV: 91.3 fL (ref 78.0–100.0)
Platelets: 188 10*3/uL (ref 150–400)
RBC: 3.11 MIL/uL — ABNORMAL LOW (ref 3.87–5.11)
RDW: 14 % (ref 11.5–15.5)
WBC: 11.4 10*3/uL — ABNORMAL HIGH (ref 4.0–10.5)

## 2012-11-08 LAB — GLUCOSE, CAPILLARY: Glucose-Capillary: 95 mg/dL (ref 70–99)

## 2012-11-08 MED ORDER — DILTIAZEM HCL ER COATED BEADS 120 MG PO CP24: 120.0000 mg | ORAL_CAPSULE | Freq: Every day | ORAL | Status: DC

## 2012-11-08 MED ORDER — HYDROCODONE-ACETAMINOPHEN 5-325 MG PO TABS: 2.0000 | ORAL_TABLET | Freq: Four times a day (QID) | ORAL | Status: DC | PRN

## 2012-11-08 MED ORDER — AMOXICILLIN-POT CLAVULANATE 875-125 MG PO TABS: 1.0000 | ORAL_TABLET | Freq: Two times a day (BID) | ORAL | Status: DC

## 2012-11-08 NOTE — Progress Notes (Signed)
Patient Name: Brenda Arnold      SUBJECTIVE: anticoagulated with coumadin for afib presenting wi epistaxis; INR 1.77; has CKD INR now 1.1  Nocturnal bradycardia>>w sleeping   Past Medical History  Diagnosis Date  . Diabetes mellitus   . Obesity   . Dyslipidemia   . Asthma   . Paroxysmal a-fib     a. chronic coumadin;  b. 12/2009 Echo: EF 60-65%, Gr 1 DD.  Marland Kitchen Sleep apnea     a. not using CPAP, last study  >8 yrs  . Sleep apnea   . HTN (hypertension)   . Arthritis   . Epistaxis 11/05/2012  . CKD (chronic kidney disease), stage IV     PHYSICAL EXAM Filed Vitals:   11/07/12 1043 11/07/12 1344 11/07/12 2100 11/08/12 0600  BP: 150/70 121/53 142/66 154/71  Pulse:  71 73 84  Temp:  98.2 F (36.8 C) 98.6 F (37 C) 98.7 F (37.1 C)  TempSrc:  Oral    Resp:  17 20 16   Height:      Weight:      SpO2:  99% 98% 97%   Well developed and nourished in no acute distress HENT normal Neck supple with JVP-flat;  Carotids brisk and full without bruits Clear Regular rate and rhythm, no murmurs or gallops Abd-soft with active BS without hepatomegaly No Clubbing cyanosis edema Skin-warm and dry A & Oriented  Grossly normal sensory and motor function  TELEMETRY: Reviewed telemetry pt in nsr    Intake/Output Summary (Last 24 hours) at 11/08/12 0806 Last data filed at 11/07/12 1300  Gross per 24 hour  Intake    240 ml  Output      0 ml  Net    240 ml    LABS: Basic Metabolic Panel:  Lab [id number] 0520 11/07/12 0440 11/06/12 0518 11/05/12 2002 11/05/12 1250  NA 141 140 142 -- 142  K 3.7 3.9 4.0 -- 3.7  CL 108 106 107 -- 109  CO2 26 25 22  -- 24  GLUCOSE 90 94 127* -- 92  BUN 47* 47* 53* -- 37*  CREATININE 3.21* 2.97* 2.96* -- 2.90*  CALCIUM 9.5 9.7 -- -- --  MG -- -- -- 1.9 --  PHOS -- -- -- -- --   Cardiac Enzymes: No results found for this basename: CKTOTAL:3,CKMB:3,CKMBINDEX:3,TROPONINI:3 in the last 72 hours CBC:  Lab 11/08/12 0520 11/07/12 1815 11/07/12  0440 11/06/12 2022 11/06/12 1210 11/06/12 0518 11/05/12 1250  WBC 11.4* 11.6* 14.4* 13.2* 12.4* 12.9* 10.5  NEUTROABS -- -- -- -- -- -- 7.2  HGB 8.9* 9.6* 9.7* 10.2* 10.5* 10.6* 12.4  HCT 28.4* 29.6* 30.5* 32.4* 32.6* 33.7* 38.2  MCV 91.3 91.1 91.0 90.8 91.1 91.1 91.0  PLT 188 195 213 210 221 219 214   PROTIME:  Basename 11/07/12 0440 11/06/12 0518 11/05/12 1250  LABPROT 16.0* 21.5* 20.0*  INR 1.31 1.95* 1.77*   Liver Function Tests: No results found for this basename: AST:2,ALT:2,ALKPHOS:2,BILITOT:2,PROT:2,ALBUMIN:2 in the last 72 hours No results found for this basename: LIPASE:2,AMYLASE:2 in the last 72 hours BNP: BNP (last 3 results) No results found for this basename: PROBNP:3 in the last 8760 hours D-Dimer: No results found for this basename: DDIMER:2 in the last 72 hours Hemoglobin A1C:  Basename 11/05/12 2002  HGBA1C 5.6     ASSESSMENT AND PLAN:  Patient Active Hospital Problem List: Acute posterior epistaxis (11/05/2012)   Atrial fibrillation (12/20/2009)   DM (diabetes mellitus) (11/05/2012)   OSA (obstructive sleep apnea) (  11/05/2012)   CKD (chronic kidney disease) (11/05/2012)   Bradycardia likely 2./2 OSA  Will continue to follow;  Will need anticoagulation again when ok with ENT Dr Viona Gilmore has decided against NOAC    OK to discharge      Signed, Virl Axe MD  11/08/2012

## 2012-11-08 NOTE — Discharge Summary (Signed)
Physician Discharge Summary  Brenda Arnold [id number] DOB: May 19, 1956 DOA: 11/05/2012  PCP: Osborne Casco, MD  Admit date: 11/05/2012 Discharge date: 11/08/2012  Time spent:  40 minutes   Recommendations for Outpatient Follow-up:  1. Home with outpatient ENT and PCP follow up 2. resume coumadin once cleared by ENT 3. Needs repeat  outpatient sleep study for severe sleep apnea.  Discharge Diagnoses:  Principal Problem:  *Acute epistaxis  Active Problems:  OSA (obstructive sleep apnea)  Bradycardia  DM (diabetes mellitus)  Obesity  Hypertension  Atrial fibrillation  CKD (chronic kidney disease)   Discharge Condition: fair  Diet recommendation: diabetic  Filed Weights   11/05/12 1230 11/06/12 0508 11/07/12 0500  Weight: 133.358 kg (294 lb) 130.182 kg (287 lb) 124.286 kg (274 lb)    History of present illness:  56 y.o. female with PMHx of CKD with AVF, DM, afib on chronic coumadin therapy, hyperlipidemia, OSA who prented to ED with several hour history of epistaxisis of left nose.  Hospital Course:     acute posterior epistaxis  Patient with Rhino Rocket in place.  ENT, Dr. Constance Holster, has seen the patient and will see her in the office on 12/10 to remove packing  Coumadin held for now. INR trended down. Per cardiology will need to be back on coumadin once cleared by ENT . Slight drop in H&H but stable with occasional blood tripping from the nostril.  On empiric augmentin. Will treat for 7 days.  atrial fibrillation  Coumadin on hold and to be resumed once cleared by ENT.Marland Kitchen  Episode of bradycardia down to 30s with frequent short pauses . [facility] Consulted and recommend this to be relate to her severe sleep apnea .  reduced dose of cardizem.  Patient was on CPAP at [place] but has not used it due to claustrophobia . She need sa repeat sleep study as outpatient and be placed on suitable CPAP machine.   Acute blood loss anemia  Secondary to  epistaxis and IVF  H&H stable.  hypertension  Continue home regimen of clonidine, Lasix, Cozaar, diltiazem ( dose reduced).   chronic kidney disease status post fistula placement  Baseline creatinine seems to be around 3.5 Currently stable. Continue Cozaar, Lasix, vitamin D, calcitriol. Followup with nephrology as outpatient.   type 2 diabetes mellitus  hemoglobin A1c is 5.6 and stable.   Patient clinically stable for discharge home with outpt follow up.    Procedures:  Rhino rocket nasal packing over left nostril in ED  Consultations:  ENT Constance Holster)  [place] [facility]  Discharge Exam: Filed Vitals:   11/07/12 1043 11/07/12 1344 11/07/12 2100 11/08/12 0600  BP: 150/70 121/53 142/66 154/71  Pulse:  71 73 84  Temp:  98.2 F (36.8 C) 98.6 F (37 C) 98.7 F (37.1 C)  TempSrc:  Oral    Resp:  17 20 16   Height:      Weight:      SpO2:  99% 98% 97%   General: Middle aged obese female in NAD  HEENT: No pallor, and rocket in place over the left nostril, no bleeding noted  Cardiovascular: Normal S1 and S2, no murmurs  Respiratory: clear b/l, no added sounds  Abdomen: soft, N,T ND, BS+  Ext: warm, no edema  CNS: AAOX3   Discharge Instructions  Discharge Orders    Future Appointments: Provider: Department: Dept Phone: Center:   11/10/2012 5:00 PM [facility] [place] [facility]  Medication List     As of 11/08/2012  9:25 AM    STOP taking these medications         aspirin EC 81 MG EC tablet   Generic drug: aspirin      MATZIM LA 360 MG 24 hr tablet   Generic drug: diltiazem      warfarin 5 MG tablet   Commonly known as: COUMADIN      TAKE these medications         amoxicillin-clavulanate 875-125 MG per tablet   Commonly known as: AUGMENTIN   Take 1 tablet by mouth 2 (two) times daily.      calcitRIOL 0.25 MCG capsule   Commonly known as: ROCALTROL   Take 1 capsule by mouth daily.      CALCIUM 600 + D  PO   Take 2 tablets by mouth daily.      cloNIDine 0.1 MG tablet   Commonly known as: CATAPRES   Take 1 tablet by mouth 2 (two) times daily.      CORRECTOL PO   Take 1-2 capsules by mouth daily as needed. For constipation      diltiazem 120 MG 24 hr capsule   Commonly known as: CARDIZEM CD   Take 1 capsule (120 mg total) by mouth daily.      DRISDOL 16109 UNITS capsule   Generic drug: ergocalciferol   Take 50,000 Units by mouth once a week. On Thursdays      FIBER THERAPY PO   Take 2 tablets by mouth daily.      furosemide 80 MG tablet   Commonly known as: LASIX   Take 40 mg by mouth every morning. 1/2 tablet of 80 mg daily      glimepiride 2 MG tablet   Commonly known as: AMARYL   Take 2 mg by mouth daily. With 1st main meal of the day      HYDROcodone-acetaminophen 5-325 MG per tablet   Commonly known as: NORCO/VICODIN   Take 2 tablets by mouth every 6 (six) hours as needed for pain.      losartan 100 MG tablet   Commonly known as: COZAAR   Take 1 tablet (100 mg total) by mouth daily.      TYLENOL ARTHRITIS PAIN 650 MG CR tablet   Generic drug: acetaminophen   Take 650 mg by mouth every 8 (eight) hours as needed. For pain      Vitamin D-3 1000 UNITS Caps   Take 1 capsule by mouth daily.           Follow-up Information    Follow up with Izora Gala, MD. On 11/10/2012. (arrive at 2:15 for a 2:30 appointment.  Bring insurance card and list of medications)    Contact information:   [address], [address] [place] [address] [phone]       Follow up with Osborne Casco, MD. In 1 week. (please check CBC)    Contact information:   [address], [place] [address] [phone]           The results of significant diagnostics from this hospitalization (including imaging, microbiology, ancillary and laboratory) are listed below for reference.    Significant Diagnostic Studies: X-ray Chest Pa And Lateral    11/06/2012  *RADIOLOGY REPORT*  Clinical Data: Epistaxis, diabetes, hypertension  CHEST - 2 VIEW  Comparison: Chest x-ray of 08/26/2012  Findings: No active infiltrate or effusion is seen.  The heart is  mildly enlarged and stable.  There are degenerative change is diffusely throughout the thoracic spine.  IMPRESSION: No active lung disease.  Stable mild cardiomegaly.   Original Report Authenticated By: Ivar Drape, M.D.     Microbiology: Recent Results (from the past 240 hour(s))  URINE CULTURE     Status: Normal   Collection Time   11/05/12 11:55 PM      Component Value Range Status Comment   Specimen Description URINE, RANDOM   Final    Special Requests NONE   Final    Culture  Setup Time 11/06/2012 00:24   Final    Colony Count NO GROWTH   Final    Culture NO GROWTH   Final    Report Status 11/07/2012 FINAL   Final      Labs: Basic Metabolic Panel:  Lab [id number] 0520 11/07/12 0440 11/06/12 0518 11/05/12 2002 11/05/12 1250  NA 141 140 142 -- 142  K 3.7 3.9 4.0 -- 3.7  CL 108 106 107 -- 109  CO2 26 25 22  -- 24  GLUCOSE 90 94 127* -- 92  BUN 47* 47* 53* -- 37*  CREATININE 3.21* 2.97* 2.96* -- 2.90*  CALCIUM 9.5 9.7 9.4 -- 9.7  MG -- -- -- 1.9 --  PHOS -- -- -- -- --   Liver Function Tests: No results found for this basename: AST:5,ALT:5,ALKPHOS:5,BILITOT:5,PROT:5,ALBUMIN:5 in the last 168 hours No results found for this basename: LIPASE:5,AMYLASE:5 in the last 168 hours No results found for this basename: AMMONIA:5 in the last 168 hours CBC:  Lab 11/08/12 0520 11/07/12 1815 11/07/12 0440 11/06/12 2022 11/06/12 1210 11/05/12 1250  WBC 11.4* 11.6* 14.4* 13.2* 12.4* --  NEUTROABS -- -- -- -- -- 7.2  HGB 8.9* 9.6* 9.7* 10.2* 10.5* --  HCT 28.4* 29.6* 30.5* 32.4* 32.6* --  MCV 91.3 91.1 91.0 90.8 91.1 --  PLT 188 195 213 210 221 --   Cardiac Enzymes: No results found for this basename: CKTOTAL:5,CKMB:5,CKMBINDEX:5,TROPONINI:5 in the last 168 hours BNP: BNP (last 3  results) No results found for this basename: PROBNP:3 in the last 8760 hours CBG:  Lab 11/08/12 0735 11/07/12 2113 11/07/12 1705 11/07/12 1120 11/07/12 0738  GLUCAP 95 105* 81 94 100*       Signed:  Keiyana Stehr  [facility] 11/08/2012, 9:25 AM

## 2012-11-08 NOTE — Discharge Instructions (Signed)
Nosebleed  Nosebleeds can be caused by many conditions including trauma, infections, polyps, foreign bodies, dry mucous membranes or climate, medications and air conditioning. Most nosebleeds occur in the front of the nose. It is because of this location that most nosebleeds can be controlled by pinching the nostrils gently and continuously. Do this for at least 10 to 20 minutes. The reason for this long continuous pressure is that you must hold it long enough for the blood to clot. If during that 10 to 20 minute time period, pressure is released, the process may have to be started again. The nosebleed may stop by itself, quit with pressure, need concentrated heating (cautery) or stop with pressure from packing.  HOME CARE INSTRUCTIONS     If your nose was packed, try to maintain the pack inside until your caregiver removes it. If a gauze pack was used and it starts to fall out, gently replace or cut the end off. Do not cut if a balloon catheter was used to pack the nose. Otherwise, do not remove unless instructed.   Avoid blowing your nose for 12 hours after treatment. This could dislodge the pack or clot and start bleeding again.   If the bleeding starts again, sit up and bending forward, gently pinch the front half of your nose continuously for 20 minutes.   If bleeding was caused by dry mucous membranes, cover the inside of your nose every morning with a petroleum or antibiotic ointment. Use your little fingertip as an applicator. Do this as needed during dry weather. This will keep the mucous membranes moist and allow them to heal.   Maintain humidity in your home by using less air conditioning or using a humidifier.   Do not use aspirin or medications which make bleeding more likely. Your caregiver can give you recommendations on this.   Resume normal activities as able but try to avoid straining, lifting or bending at the waist for several days.    If the nosebleeds become recurrent and the cause is unknown, your caregiver may suggest laboratory tests.  SEEK IMMEDIATE MEDICAL CARE IF:     Bleeding recurs and cannot be controlled.   There is unusual bleeding from or bruising on other parts of the body.   You have a fever.   Nosebleeds continue.   There is any worsening of the condition which originally brought you in.   You become lightheaded, feel faint, become sweaty or vomit blood.  MAKE SURE YOU:     Understand these instructions.   Will watch your condition.   Will get help right away if you are not doing well or get worse.  Document Released: 08/28/2005 Document Revised: 02/10/2012 Document Reviewed: 10/20/2009  [employer] Patient Information 2013 [employer]

## 2012-11-09 MED FILL — Ondansetron HCl Inj 4 MG/2ML (2 MG/ML): INTRAMUSCULAR | Qty: 2 | Status: AC

## 2012-11-10 ENCOUNTER — Ambulatory Visit (HOSPITAL_COMMUNITY)
Admission: RE | Admit: 2012-11-10 | Discharge: 2012-11-10 | Disposition: A | Discharge status: Home / Self Care | Payer: BC Managed Care – PPO | Source: Ambulatory Visit | Attending: Family Medicine | Admitting: Family Medicine

## 2012-11-10 DIAGNOSIS — Z1231 Encounter for screening mammogram for malignant neoplasm of breast: Secondary | ICD-10-CM | POA: Insufficient documentation

## 2012-11-20 ENCOUNTER — Encounter: Payer: BC Managed Care – PPO | Admitting: Physician Assistant

## 2013-04-05 ENCOUNTER — Ambulatory Visit: Payer: Self-pay | Admitting: Podiatry

## 2013-04-06 ENCOUNTER — Encounter: Payer: Self-pay | Admitting: Podiatry

## 2013-04-06 ENCOUNTER — Ambulatory Visit (INDEPENDENT_AMBULATORY_CARE_PROVIDER_SITE_OTHER): Payer: BC Managed Care – PPO | Admitting: Podiatry

## 2013-04-06 VITALS — BP 173/88 | HR 69 | Ht 66.0 in | Wt 285.0 lb

## 2013-04-06 DIAGNOSIS — B351 Tinea unguium: Secondary | ICD-10-CM

## 2013-04-06 DIAGNOSIS — M25579 Pain in unspecified ankle and joints of unspecified foot: Secondary | ICD-10-CM
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
## 2013-06-16 ENCOUNTER — Other Ambulatory Visit: Payer: Self-pay | Admitting: Cardiology

## 2013-08-16 ENCOUNTER — Encounter: Payer: Self-pay | Admitting: Podiatry

## 2013-08-16 ENCOUNTER — Ambulatory Visit (INDEPENDENT_AMBULATORY_CARE_PROVIDER_SITE_OTHER): Payer: BC Managed Care – PPO | Admitting: Podiatry

## 2013-08-16 VITALS — BP 149/81 | HR 78 | Ht 66.0 in | Wt 283.0 lb

## 2013-08-16 DIAGNOSIS — M25579 Pain in unspecified ankle and joints of unspecified foot: Secondary | ICD-10-CM

## 2013-08-16 DIAGNOSIS — B351 Tinea unguium: Secondary | ICD-10-CM

## 2013-08-16 NOTE — Progress Notes (Signed)
Came in for diabetic foot care and long toe nails. Blood sugar was normal. A1c runs between 5.1-5.9.  Thick and hypertrophic nails x 10.   Pedal pulses are palpable. Nails are all thick and great toe nails are very deformed. No skin lesions. No edema or erythema noted.  All nails debrided. Return as needed.

## 2013-08-16 NOTE — Patient Instructions (Signed)
Seen for hypertrophic nails. No new problems noted. Need to have more frequent visit to improve thick deformed fungal nail condition.  Return in 3 month or sooner as needed.

## 2013-10-04 ENCOUNTER — Ambulatory Visit (INDEPENDENT_AMBULATORY_CARE_PROVIDER_SITE_OTHER): Payer: BC Managed Care – PPO | Admitting: Cardiology

## 2013-10-04 ENCOUNTER — Encounter: Payer: Self-pay | Admitting: Cardiology

## 2013-10-04 VITALS — BP 150/81 | HR 80 | Ht 66.0 in | Wt 279.0 lb

## 2013-10-04 DIAGNOSIS — G4733 Obstructive sleep apnea (adult) (pediatric): Secondary | ICD-10-CM

## 2013-10-04 DIAGNOSIS — E669 Obesity, unspecified: Secondary | ICD-10-CM

## 2013-10-04 DIAGNOSIS — I4891 Unspecified atrial fibrillation: Secondary | ICD-10-CM

## 2013-10-04 DIAGNOSIS — I10 Essential (primary) hypertension: Secondary | ICD-10-CM

## 2013-10-04 NOTE — Patient Instructions (Signed)
We contacted [facility] and they will be contacting you within the next week to get a download for your CPAP machine.   Your physician wants you to follow-up in: 6 months with Dr. Mallie Snooks will receive a reminder letter in the mail two months in advance. If you don't receive a letter, please call our office to schedule the follow-up appointment.

## 2013-10-04 NOTE — Progress Notes (Signed)
[place], Brenda Arnold, Bren[place] Arnold  [id number] Phone: [phone] Fax:  [phone]  Date:  10/04/2013   ID:  Brenda Arnold, DOB Aug 01, 1956, MRN [id number]  PCP:  Osborne Casco, MD  Sleep Medicine:  Fransico Him, MD     History of Present Illness: Brenda Arnold is a 57 y.o. female with a history of OSA, PAF, obesity and HTN presents today for followup.  She is doing well.  She tolerates her CPAP device but says she does not use it as much as she should because of going to bed too late and forgetting it.   She tolerates the mask well and feels the pressure is adequate.  She feels refreshed in the am and has no daytime sleepiness when she uses the device for at least 5 hours.      Wt Readings from Last 3 Encounters:  10/04/13 279 lb (126.554 kg)  08/16/13 283 lb (128.368 kg)  04/06/13 285 lb (129.275 kg)     Past Medical History  Diagnosis Date  . Diabetes mellitus   . Obesity     s/p panniculectomy  . Dyslipidemia   . Asthma   . Paroxysmal a-fib     a. chronic coumadin;  b. 12/2009 Echo: EF 60-65%, Gr 1 DD.  Marland Kitchen Sleep apnea     a. not using CPAP, last study  >8 yrs  . Sleep apnea   . HTN (hypertension)   . Arthritis   . Epistaxis 11/05/2012  . CKD (chronic kidney disease), stage IV   . Hyperlipidemia   . Anemia   . Hyperparathyroidism due to renal insufficiency   . PCOS (polycystic ovarian syndrome)   . Vitamin D deficiency     Current Outpatient Prescriptions  Medication Sig Dispense Refill  . acetaminophen (TYLENOL ARTHRITIS PAIN) 650 MG CR tablet Take 650 mg by mouth every 8 (eight) hours as needed. For pain      . aspirin 81 MG tablet Take 81 mg by mouth daily.      . Bisacodyl (CORRECTOL PO) Take 1-2 capsules by mouth daily as needed. For constipation      . calcitRIOL (ROCALTROL) 0.25 MCG capsule Take 1 capsule by mouth daily.       . Calcium Carbonate-Vitamin D (CALCIUM 600 + D PO) Take 2 tablets by mouth daily.      . Cholecalciferol  (VITAMIN D-3) 1000 UNITS CAPS Take 1 capsule by mouth daily.      . cloNIDine (CATAPRES) 0.1 MG tablet Take 1 tablet by mouth 2 (two) times daily.       Marland Kitchen diltiazem (CARDIZEM CD) 120 MG 24 hr capsule Take 1 capsule (120 mg total) by mouth daily.  30 capsule  3  . furosemide (LASIX) 80 MG tablet Take 1/2 tab twice aday      . glimepiride (AMARYL) 2 MG tablet Take 2 mg by mouth daily. With 1st main meal of the day      . losartan (COZAAR) 100 MG tablet TAKE 1 TABLET DAILY  90 tablet  3  . Methylcellulose, Laxative, (FIBER THERAPY PO) Take 2 tablets by mouth daily.      Marland Kitchen warfarin (COUMADIN) 5 MG tablet Take 5 mg by mouth daily. 5 mg 1 tab daily for 6 days and 1.5 tab on Sundays.       No current facility-administered medications for this visit.    Allergies:    Allergies  Allergen Reactions  . Iodine Shortness Of Breath  .  Shellfish Allergy Shortness Of Breath  . Norvasc [Amlodipine]     Social History:  The patient  reports that she has never smoked. She has never used smokeless tobacco. She reports that she does not drink alcohol or use illicit drugs.   Family History:  The patient's family history includes Diabetes in her brother; Hypertension in her mother; Kidney disease in her brother; Lung cancer in her father.   ROS:  Please see the history of present illness.      All other systems reviewed and negative.   PHYSICAL EXAM: VS:  Ht 5\' 6"  (1.676 m)  Wt 279 lb (126.554 kg)  BMI 45.05 kg/m2 Well nourished, well developed, in no acute distress HEENT: normal Neck: no JVD Cardiac:  normal S1, S2; RRR; no murmur Lungs:  clear to auscultation bilaterally, no wheezing, rhonchi or rales Abd: soft, nontender, no hepatomegaly Ext: no edema Skin: warm and dry Neuro:  CNs 2-12 intact, no focal abnormalities noted      ASSESSMENT AND PLAN:  1. OSA on CPAP  - I will get a download from her DME 2. HTN  - continue clonidine/Diltiazem/Losartan 3. Obesity 4. PAF maintaining NSR  -  continue Diltiazem/Warfarin   Folllowup with me in 6 months  Signed, Fransico Him, MD 10/04/2013 2:43 PM

## 2013-11-15 ENCOUNTER — Other Ambulatory Visit (HOSPITAL_COMMUNITY): Payer: Self-pay | Admitting: Family Medicine

## 2013-11-15 DIAGNOSIS — Z1231 Encounter for screening mammogram for malignant neoplasm of breast: Secondary | ICD-10-CM

## 2013-11-19 ENCOUNTER — Ambulatory Visit (HOSPITAL_COMMUNITY)
Admission: RE | Admit: 2013-11-19 | Discharge: 2013-11-19 | Disposition: A | Discharge status: Home / Self Care | Payer: BC Managed Care – PPO | Source: Ambulatory Visit | Attending: Family Medicine | Admitting: Family Medicine

## 2013-11-19 DIAGNOSIS — Z1231 Encounter for screening mammogram for malignant neoplasm of breast: Secondary | ICD-10-CM | POA: Insufficient documentation

## 2014-03-28 ENCOUNTER — Other Ambulatory Visit: Payer: Self-pay | Admitting: *Deleted

## 2014-03-28 DIAGNOSIS — T82598A Other mechanical complication of other cardiac and vascular devices and implants, initial encounter: Secondary | ICD-10-CM

## 2014-04-20 ENCOUNTER — Encounter: Payer: Self-pay | Admitting: Vascular Surgery

## 2014-04-21 ENCOUNTER — Ambulatory Visit (HOSPITAL_COMMUNITY)
Admission: RE | Admit: 2014-04-21 | Discharge: 2014-04-21 | Disposition: A | Discharge status: Home / Self Care | Payer: BC Managed Care – PPO | Source: Ambulatory Visit | Attending: Vascular Surgery | Admitting: Vascular Surgery

## 2014-04-21 ENCOUNTER — Encounter: Payer: Self-pay | Admitting: Vascular Surgery

## 2014-04-21 ENCOUNTER — Encounter: Payer: Self-pay | Admitting: *Deleted

## 2014-04-21 ENCOUNTER — Ambulatory Visit (INDEPENDENT_AMBULATORY_CARE_PROVIDER_SITE_OTHER): Payer: BC Managed Care – PPO | Admitting: Vascular Surgery

## 2014-04-21 ENCOUNTER — Other Ambulatory Visit: Payer: Self-pay | Admitting: *Deleted

## 2014-04-21 VITALS — BP 144/77 | HR 84 | Resp 16 | Ht 67.0 in | Wt 285.0 lb

## 2014-04-21 DIAGNOSIS — T82598A Other mechanical complication of other cardiac and vascular devices and implants, initial encounter: Secondary | ICD-10-CM

## 2014-04-21 DIAGNOSIS — T82898A Other specified complication of vascular prosthetic devices, implants and grafts, initial encounter: Secondary | ICD-10-CM | POA: Insufficient documentation

## 2014-04-21 DIAGNOSIS — Y849 Medical procedure, unspecified as the cause of abnormal reaction of the patient, or of later complication, without mention of misadventure at the time of the procedure: Secondary | ICD-10-CM | POA: Insufficient documentation

## 2014-04-21 DIAGNOSIS — N186 End stage renal disease: Secondary | ICD-10-CM

## 2014-04-21 NOTE — Progress Notes (Signed)
Patient is a 58 year old female returns for followup today for placement of a left radiocephalic AV fistula on September 02 2012. She denies any symptoms of numbness tingling or aching in her hand. She is currently not on dialysis. She is followed by Dr. Lorrene Reid. She is referred for possible revision of the fistula to improve it overall.  Current Outpatient Prescriptions on File Prior to Visit  Medication Sig Dispense Refill  . acetaminophen (TYLENOL ARTHRITIS PAIN) 650 MG CR tablet Take 650 mg by mouth every 8 (eight) hours as needed. For pain      . aspirin 81 MG tablet Take 81 mg by mouth daily.      . Bisacodyl (CORRECTOL PO) Take 1-2 capsules by mouth daily as needed. For constipation      . calcitRIOL (ROCALTROL) 0.25 MCG capsule Take 1 capsule by mouth daily.       . Calcium Carbonate-Vitamin D (CALCIUM 600 + D PO) Take 2 tablets by mouth daily.      . Cholecalciferol (VITAMIN D-3) 1000 UNITS CAPS Take 1 capsule by mouth daily.      . cloNIDine (CATAPRES) 0.1 MG tablet Take 1 tablet by mouth 2 (two) times daily.       Marland Kitchen diltiazem (CARDIZEM CD) 120 MG 24 hr capsule Take 1 capsule (120 mg total) by mouth daily.  30 capsule  3  . furosemide (LASIX) 80 MG tablet Take 1/2 tab twice aday      . glimepiride (AMARYL) 2 MG tablet Take 2 mg by mouth daily. With 1st main meal of the day      . losartan (COZAAR) 100 MG tablet TAKE 1 TABLET DAILY  90 tablet  3  . Methylcellulose, Laxative, (FIBER THERAPY PO) Take 2 tablets by mouth daily.      Marland Kitchen warfarin (COUMADIN) 5 MG tablet Take 5 mg by mouth daily. 5 mg 1 tab daily for 6 days and 1.5 tab on Sundays.       No current facility-administered medications on file prior to visit.     Physical exam:  Filed Vitals:   04/21/14 1352  BP: 144/77  Pulse: 84  Resp: 16  Height: 5\' 7"  (1.702 m)  Weight: 285 lb (129.275 kg)   Left upper extremity: Well-healed incision, palpable thrill audible bruit in fistula. There is one large side branch  laterally.  Data: Duplex of AV fistula today shows a diameter is between 5 and 7 mm. It is less than 3 mm from the skin throughout most of the course. The vein has doubled in diameter since placement of the fistula. There was some increased velocity in the proximal aspect this may be due to normal hemodynamics.  Assessment: Developing fistula left arm with multiple proximal sidebranch  Plan: Ligation of side branches left arm AV fistula.  She is on Coumadin for atrial fibrillation. We will stop his 3 days prior to her procedure. Revision of the fistula scheduled for May 27.  Ruta Hinds, MD [facility] Office: [phone] Pager: [phone]

## 2014-04-26 ENCOUNTER — Encounter (HOSPITAL_COMMUNITY): Payer: Self-pay | Admitting: *Deleted

## 2014-04-26 MED ORDER — CHLORHEXIDINE GLUCONATE CLOTH 2 % EX PADS: 6.0000 | MEDICATED_PAD | Freq: Once | CUTANEOUS | Status: DC

## 2014-04-26 MED ORDER — DEXTROSE 5 % IV SOLN
1.5000 g | INTRAVENOUS | Status: AC
  Administered 2014-04-27: 1.5 g via INTRAVENOUS
  Filled 2014-04-26: qty 1.5

## 2014-04-26 MED ORDER — SODIUM CHLORIDE 0.9 % IV SOLN
INTRAVENOUS | Status: DC
  Administered 2014-04-27: 09:00:00 via INTRAVENOUS

## 2014-04-27 ENCOUNTER — Encounter (HOSPITAL_COMMUNITY)
Admission: RE | Disposition: A | Discharge status: Home / Self Care | Payer: Self-pay | Source: Ambulatory Visit | Attending: Vascular Surgery

## 2014-04-27 ENCOUNTER — Encounter (HOSPITAL_COMMUNITY): Payer: BC Managed Care – PPO | Admitting: Critical Care Medicine

## 2014-04-27 ENCOUNTER — Telehealth: Payer: Self-pay | Admitting: Vascular Surgery

## 2014-04-27 ENCOUNTER — Ambulatory Visit (HOSPITAL_COMMUNITY): Payer: BC Managed Care – PPO

## 2014-04-27 ENCOUNTER — Ambulatory Visit (HOSPITAL_COMMUNITY): Payer: BC Managed Care – PPO | Admitting: Critical Care Medicine

## 2014-04-27 ENCOUNTER — Ambulatory Visit (HOSPITAL_COMMUNITY)
Admission: RE | Admit: 2014-04-27 | Discharge: 2014-04-27 | Disposition: A | Discharge status: Home / Self Care | Payer: BC Managed Care – PPO | Source: Ambulatory Visit | Attending: Vascular Surgery | Admitting: Vascular Surgery

## 2014-04-27 ENCOUNTER — Other Ambulatory Visit: Payer: Self-pay | Admitting: *Deleted

## 2014-04-27 DIAGNOSIS — T82898A Other specified complication of vascular prosthetic devices, implants and grafts, initial encounter: Secondary | ICD-10-CM

## 2014-04-27 DIAGNOSIS — I120 Hypertensive chronic kidney disease with stage 5 chronic kidney disease or end stage renal disease: Secondary | ICD-10-CM | POA: Insufficient documentation

## 2014-04-27 DIAGNOSIS — N189 Chronic kidney disease, unspecified: Secondary | ICD-10-CM

## 2014-04-27 DIAGNOSIS — Z7901 Long term (current) use of anticoagulants: Secondary | ICD-10-CM | POA: Insufficient documentation

## 2014-04-27 DIAGNOSIS — Z79899 Other long term (current) drug therapy: Secondary | ICD-10-CM | POA: Insufficient documentation

## 2014-04-27 DIAGNOSIS — Z7982 Long term (current) use of aspirin: Secondary | ICD-10-CM | POA: Insufficient documentation

## 2014-04-27 DIAGNOSIS — N186 End stage renal disease: Secondary | ICD-10-CM | POA: Insufficient documentation

## 2014-04-27 DIAGNOSIS — Y9210 Unspecified residential institution as the place of occurrence of the external cause: Secondary | ICD-10-CM | POA: Insufficient documentation

## 2014-04-27 DIAGNOSIS — Z4931 Encounter for adequacy testing for hemodialysis: Secondary | ICD-10-CM

## 2014-04-27 DIAGNOSIS — Y832 Surgical operation with anastomosis, bypass or graft as the cause of abnormal reaction of the patient, or of later complication, without mention of misadventure at the time of the procedure: Secondary | ICD-10-CM | POA: Insufficient documentation

## 2014-04-27 DIAGNOSIS — Z992 Dependence on renal dialysis: Secondary | ICD-10-CM | POA: Insufficient documentation

## 2014-04-27 HISTORY — DX: Other seasonal allergic rhinitis: J30.2

## 2014-04-27 HISTORY — PX: REVISON OF ARTERIOVENOUS FISTULA: SHX6074

## 2014-04-27 HISTORY — DX: Adverse effect of unspecified anesthetic, initial encounter: T41.45XA

## 2014-04-27 HISTORY — DX: Pneumonia, unspecified organism: J18.9

## 2014-04-27 HISTORY — DX: Other complications of anesthesia, initial encounter: T88.59XA

## 2014-04-27 LAB — POCT I-STAT 4, (NA,K, GLUC, HGB,HCT)
Glucose, Bld: 90 mg/dL (ref 70–99)
HCT: 37 % (ref 36.0–46.0)
Hemoglobin: 12.6 g/dL (ref 12.0–15.0)
Potassium: 3.9 mEq/L (ref 3.7–5.3)
Sodium: 142 mEq/L (ref 137–147)

## 2014-04-27 LAB — GLUCOSE, CAPILLARY: Glucose-Capillary: 87 mg/dL (ref 70–99)

## 2014-04-27 SURGERY — REVISON OF ARTERIOVENOUS FISTULA
Anesthesia: General | Site: Arm Lower | Laterality: Left

## 2014-04-27 MED ORDER — OXYCODONE HCL 5 MG/5ML PO SOLN: 5.0000 mg | Freq: Once | ORAL | Status: DC | PRN

## 2014-04-27 MED ORDER — SODIUM CHLORIDE 0.9 % IV SOLN
INTRAVENOUS | Status: DC | PRN
  Administered 2014-04-27: 13:00:00 via INTRAVENOUS

## 2014-04-27 MED ORDER — LIDOCAINE HCL (PF) 1 % IJ SOLN
INTRAMUSCULAR | Status: DC | PRN
  Administered 2014-04-27: 5 mL via INTRADERMAL

## 2014-04-27 MED ORDER — PROPOFOL 10 MG/ML IV BOLUS
INTRAVENOUS | Status: AC
  Filled 2014-04-27: qty 20

## 2014-04-27 MED ORDER — FENTANYL CITRATE 0.05 MG/ML IJ SOLN
INTRAMUSCULAR | Status: AC
  Filled 2014-04-27: qty 5

## 2014-04-27 MED ORDER — LIDOCAINE HCL (PF) 1 % IJ SOLN
INTRAMUSCULAR | Status: AC
  Filled 2014-04-27: qty 30

## 2014-04-27 MED ORDER — FENTANYL CITRATE 0.05 MG/ML IJ SOLN
INTRAMUSCULAR | Status: DC | PRN
  Administered 2014-04-27: 50 ug via INTRAVENOUS

## 2014-04-27 MED ORDER — 0.9 % SODIUM CHLORIDE (POUR BTL) OPTIME
TOPICAL | Status: DC | PRN
  Administered 2014-04-27: 1000 mL

## 2014-04-27 MED ORDER — OXYCODONE HCL 5 MG PO TABS: 5.0000 mg | ORAL_TABLET | Freq: Once | ORAL | Status: DC | PRN

## 2014-04-27 MED ORDER — SODIUM CHLORIDE 0.9 % IR SOLN
Status: DC | PRN
  Administered 2014-04-27: 13:00:00

## 2014-04-27 MED ORDER — MIDAZOLAM HCL 2 MG/2ML IJ SOLN
INTRAMUSCULAR | Status: AC
  Filled 2014-04-27: qty 2

## 2014-04-27 MED ORDER — PROMETHAZINE HCL 25 MG/ML IJ SOLN: 6.2500 mg | INTRAMUSCULAR | Status: DC | PRN

## 2014-04-27 MED ORDER — HYDROMORPHONE HCL PF 1 MG/ML IJ SOLN: 0.2500 mg | INTRAMUSCULAR | Status: DC | PRN

## 2014-04-27 MED ORDER — MIDAZOLAM HCL 5 MG/5ML IJ SOLN
INTRAMUSCULAR | Status: DC | PRN
  Administered 2014-04-27: 2 mg via INTRAVENOUS

## 2014-04-27 MED ORDER — OXYCODONE-ACETAMINOPHEN 5-500 MG PO CAPS: 1.0000 | ORAL_CAPSULE | ORAL | Status: DC | PRN

## 2014-04-27 MED ORDER — PROPOFOL INFUSION 10 MG/ML OPTIME
INTRAVENOUS | Status: DC | PRN
  Administered 2014-04-27: 100 ug/kg/min via INTRAVENOUS

## 2014-04-27 MED ORDER — MEPERIDINE HCL 25 MG/ML IJ SOLN: 6.2500 mg | INTRAMUSCULAR | Status: DC | PRN

## 2014-04-27 SURGICAL SUPPLY — 38 items
ADH SKN CLS APL DERMABOND .7 (GAUZE/BANDAGES/DRESSINGS) ×1
BLADE 10 SAFETY STRL DISP (BLADE) ×2 IMPLANT
CANISTER SUCTION 2500CC (MISCELLANEOUS) ×2 IMPLANT
CLIP TI MEDIUM 6 (CLIP) ×2 IMPLANT
CLIP TI WIDE RED SMALL 6 (CLIP) ×2 IMPLANT
COVER PROBE W GEL 5X96 (DRAPES) ×2 IMPLANT
COVER SURGICAL LIGHT HANDLE (MISCELLANEOUS) ×2 IMPLANT
DECANTER SPIKE VIAL GLASS SM (MISCELLANEOUS) ×2 IMPLANT
DERMABOND ADVANCED (GAUZE/BANDAGES/DRESSINGS) ×1
DERMABOND ADVANCED .7 DNX12 (GAUZE/BANDAGES/DRESSINGS) ×1 IMPLANT
DRAIN PENROSE 1/4X12 LTX STRL (WOUND CARE) ×2 IMPLANT
ELECT REM PT RETURN 9FT ADLT (ELECTROSURGICAL) ×2
ELECTRODE REM PT RTRN 9FT ADLT (ELECTROSURGICAL) ×1 IMPLANT
GEL ULTRASOUND 20GR AQUASONIC (MISCELLANEOUS) IMPLANT
GLOVE BIO SURGEON STRL SZ 6.5 (GLOVE) ×1 IMPLANT
GLOVE BIO SURGEON STRL SZ7.5 (GLOVE) ×3 IMPLANT
GLOVE BIOGEL PI IND STRL 6.5 (GLOVE) IMPLANT
GLOVE BIOGEL PI INDICATOR 6.5 (GLOVE) ×1
GLOVE ECLIPSE 6.5 STRL STRAW (GLOVE) ×1 IMPLANT
GOWN STRL REUS W/ TWL LRG LVL3 (GOWN DISPOSABLE) ×3 IMPLANT
GOWN STRL REUS W/ TWL XL LVL3 (GOWN DISPOSABLE) IMPLANT
GOWN STRL REUS W/TWL LRG LVL3 (GOWN DISPOSABLE) ×4
GOWN STRL REUS W/TWL XL LVL3 (GOWN DISPOSABLE) ×2
KIT BASIN OR (CUSTOM PROCEDURE TRAY) ×2 IMPLANT
KIT ROOM TURNOVER OR (KITS) ×2 IMPLANT
LOOP VESSEL MINI RED (MISCELLANEOUS) IMPLANT
NS IRRIG 1000ML POUR BTL (IV SOLUTION) ×2 IMPLANT
PACK CV ACCESS (CUSTOM PROCEDURE TRAY) ×2 IMPLANT
PAD ARMBOARD 7.5X6 YLW CONV (MISCELLANEOUS) ×4 IMPLANT
SPONGE SURGIFOAM ABS GEL 100 (HEMOSTASIS) IMPLANT
SUT PROLENE 7 0 BV 1 (SUTURE) ×1 IMPLANT
SUT VIC AB 3-0 SH 27 (SUTURE) ×2
SUT VIC AB 3-0 SH 27X BRD (SUTURE) ×1 IMPLANT
SUT VICRYL 4-0 PS2 18IN ABS (SUTURE) ×2 IMPLANT
TOWEL OR 17X24 6PK STRL BLUE (TOWEL DISPOSABLE) ×2 IMPLANT
TOWEL OR 17X26 10 PK STRL BLUE (TOWEL DISPOSABLE) ×2 IMPLANT
UNDERPAD 30X30 INCONTINENT (UNDERPADS AND DIAPERS) ×2 IMPLANT
WATER STERILE IRR 1000ML POUR (IV SOLUTION) ×2 IMPLANT

## 2014-04-27 NOTE — Op Note (Signed)
OPERATIVE REPORT  Date of Surgery: 04/27/2014  Surgeon: Tinnie Gens, MD  Assistant: Nurse  Pre-op Diagnosis: Slowly developing left radial to cephalic AV fistula with competing branches  Post-op Diagnosis: Same  Procedure: Procedure(s): REVISON OF LEFT RADIAL-CEPHALIC ARTERIOVENOUS FISTULA with ligation of 3 completing branches under ultrasound guidance  Anesthesia: MAC  EBL: Minimal  Complications: None  Procedure Details: Patient to the operative placed in supine position at which time left upper extremity was prepped Betadine scrub and solution and draped in sterile manner. After appropriate timeout was called the left forearm was examined. There is a radial cephalic fistula which had been created a few months earlier. There is a very large easily visible branch extending laterally just proximal to the wrist. After infiltration of 1% Xylocaine with epinephrine a short longitudinal incision was made over this branch which was easily dissected free ligated with 3-0 silk ties and divided. Further examination revealed 2 more branches one medial one lateral each of which was ligated with 3-0 silk ties. Careful examination of the fistula and this cephalic vein up to the antecubital area revealed no further branches with excellent flow to the fistula. There did not appear to be any compromised arterial flow at the anastomosis which was widely patent. Hemostasis was achieved and closed in layers with Vicryl subcuticular fashion and Dermabond-to the recovery condition   Tinnie Gens, MD 04/27/2014 1:43 PM

## 2014-04-27 NOTE — Telephone Encounter (Addendum)
Message copied by Gena Fray on Wed Apr 27, 2014  4:18 PM ------      Message from: Peter Minium K      Created: Wed Apr 27, 2014  2:13 PM      Regarding: Schedule                   ----- Message -----         From: Mal Misty, MD         Sent: 04/27/2014   1:54 PM           To: [facility] Charge Pool            This patient needs appointment to see Dr. Oneida Alar in 8 weeks with duplex scan of left upper extremity radial-cephalic AV fistula       ------  04/27/14: spoke with pt, dpm

## 2014-04-27 NOTE — Progress Notes (Signed)
Report given to robin rn as caregiver 

## 2014-04-27 NOTE — Interval H&P Note (Signed)
History and Physical Interval Note:  04/27/2014 7:08 AM  Brenda Arnold  has presented today for surgery, with the diagnosis of Mechanical complication of other vascular device, implant, and graft   The various methods of treatment have been discussed with the patient and family. After consideration of risks, benefits and other options for treatment, the patient has consented to  Procedure(s): REVISON OF ARTERIOVENOUS FISTULA (Left) as a surgical intervention .  The patient's history has been reviewed, patient examined, no change in status, stable for surgery.  I have reviewed the patient's chart and labs.  Questions were answered to the patient's satisfaction.     Elam Dutch

## 2014-04-27 NOTE — Interval H&P Note (Signed)
History and Physical Interval Note:  04/27/2014 12:28 PM  Brenda Arnold  has presented today for surgery, with the diagnosis of Mechanical complication of other vascular device, implant, and graft   The various methods of treatment have been discussed with the patient and family. After consideration of risks, benefits and other options for treatment, the patient has consented to  Procedure(s): REVISON OF ARTERIOVENOUS FISTULA (Left) as a surgical intervention .  The patient's history has been reviewed, patient examined, no change in status, stable for surgery.  I have reviewed the patient's chart and labs.  Questions were answered to the patient's satisfaction.     Mal Misty

## 2014-04-27 NOTE — Anesthesia Postprocedure Evaluation (Signed)
Anesthesia Post Note  Patient: Brenda Arnold  Procedure(s) Performed: Procedure(s) (LRB): REVISON OF LEFT RADIAL-CEPHALIC ARTERIOVENOUS FISTULA (Left)  Anesthesia type: MAC  Patient location: PACU  Post pain: Pain level controlled  Post assessment: Post-op Vital signs reviewed  Last Vitals: BP 141/74  Pulse 70  Temp(Src) 36.3 C (Oral)  Resp 18  Wt 285 lb (129.275 kg)  SpO2 96%  Post vital signs: Reviewed  Level of consciousness: awake  Complications: No apparent anesthesia complications

## 2014-04-27 NOTE — Transfer of Care (Signed)
Immediate Anesthesia Transfer of Care Note  Patient: Brenda Arnold  Procedure(s) Performed: Procedure(s): REVISON OF LEFT RADIAL-CEPHALIC ARTERIOVENOUS FISTULA (Left)  Patient Location: PACU  Anesthesia Type:MAC  Level of Consciousness: awake, alert  and oriented  Airway & Oxygen Therapy: Patient Spontanous Breathing and Patient connected to nasal cannula oxygen  Post-op Assessment: Report given to PACU RN and Post -op Vital signs reviewed and stable  Post vital signs: Reviewed and stable  Complications: No apparent anesthesia complications

## 2014-04-27 NOTE — H&P (View-Only) (Signed)
Brenda Arnold is a 58 year old female returns for followup today for placement of a left radiocephalic AV fistula on September 02 2012. She denies any symptoms of numbness tingling or aching in her hand. She is currently not on dialysis. She is followed by Dr. Lorrene Reid. She is referred for possible revision of the fistula to improve it overall.  Current Outpatient Prescriptions on File Prior to Visit  Medication Sig Dispense Refill  . acetaminophen (TYLENOL ARTHRITIS PAIN) 650 MG CR tablet Take 650 mg by mouth every 8 (eight) hours as needed. For pain      . aspirin 81 MG tablet Take 81 mg by mouth daily.      . Bisacodyl (CORRECTOL PO) Take 1-2 capsules by mouth daily as needed. For constipation      . calcitRIOL (ROCALTROL) 0.25 MCG capsule Take 1 capsule by mouth daily.       . Calcium Carbonate-Vitamin D (CALCIUM 600 + D PO) Take 2 tablets by mouth daily.      . Cholecalciferol (VITAMIN D-3) 1000 UNITS CAPS Take 1 capsule by mouth daily.      . cloNIDine (CATAPRES) 0.1 MG tablet Take 1 tablet by mouth 2 (two) times daily.       Marland Kitchen diltiazem (CARDIZEM CD) 120 MG 24 hr capsule Take 1 capsule (120 mg total) by mouth daily.  30 capsule  3  . furosemide (LASIX) 80 MG tablet Take 1/2 tab twice aday      . glimepiride (AMARYL) 2 MG tablet Take 2 mg by mouth daily. With 1st main meal of the day      . losartan (COZAAR) 100 MG tablet TAKE 1 TABLET DAILY  90 tablet  3  . Methylcellulose, Laxative, (FIBER THERAPY PO) Take 2 tablets by mouth daily.      Marland Kitchen warfarin (COUMADIN) 5 MG tablet Take 5 mg by mouth daily. 5 mg 1 tab daily for 6 days and 1.5 tab on Sundays.       No current facility-administered medications on file prior to visit.     Physical exam:  Filed Vitals:   04/21/14 1352  BP: 144/77  Pulse: 84  Resp: 16  Height: 5\' 7"  (1.702 m)  Weight: 285 lb (129.275 kg)   Left upper extremity: Well-healed incision, palpable thrill audible bruit in fistula. There is one large side branch  laterally.  Data: Duplex of AV fistula today shows a diameter is between 5 and 7 mm. It is less than 3 mm from the skin throughout most of the course. The vein has doubled in diameter since placement of the fistula. There was some increased velocity in the proximal aspect this may be due to normal hemodynamics.  Assessment: Developing fistula left arm with multiple proximal sidebranch  Plan: Ligation of side branches left arm AV fistula.  She is on Coumadin for atrial fibrillation. We will stop his 3 days prior to her procedure. Revision of the fistula scheduled for May 27.  Ruta Hinds, MD [facility] Office: [phone] Pager: [phone]

## 2014-04-27 NOTE — Anesthesia Preprocedure Evaluation (Signed)
Anesthesia Evaluation  Patient identified by MRN, date of birth, ID band Patient awake    Reviewed: Allergy & Precautions, H&P , NPO status , Patient's Chart, lab work & pertinent test results  History of Anesthesia Complications (+) PONV and history of anesthetic complications  Airway Mallampati: II  Neck ROM: Full    Dental  (+) Teeth Intact, Dental Advisory Given   Pulmonary asthma , sleep apnea , pneumonia -,          Cardiovascular hypertension, Pt. on medications Rhythm:Regular Rate:Normal     Neuro/Psych negative neurological ROS  negative psych ROS   GI/Hepatic negative GI ROS, Neg liver ROS,   Endo/Other  diabetes, Type 2, Oral Hypoglycemic AgentsMorbid obesity  Renal/GU ESRF and DialysisRenal disease     Musculoskeletal negative musculoskeletal ROS (+)   Abdominal (+) + obese,   Peds  Hematology  (+) anemia ,   Anesthesia Other Findings   Reproductive/Obstetrics                           Anesthesia Physical  Anesthesia Plan  ASA: III  Anesthesia Plan: General   Post-op Pain Management:    Induction: Intravenous  Airway Management Planned: LMA  Additional Equipment:   Intra-op Plan:   Post-operative Plan: Extubation in OR  Informed Consent: I have reviewed the patients History and Physical, chart, labs and discussed the procedure including the risks, benefits and alternatives for the proposed anesthesia with the patient or authorized representative who has indicated his/her understanding and acceptance.   Dental advisory given  Plan Discussed with: CRNA  Anesthesia Plan Comments:         Anesthesia Quick Evaluation                                  Anesthesia Evaluation  Patient identified by MRN, date of birth, ID band Patient awake    Reviewed: Allergy & Precautions  History of Anesthesia Complications Negative for: history of anesthetic  complications  Airway Mallampati: II  Neck ROM: Full    Dental  (+) Teeth Intact and Dental Advisory Given   Pulmonary asthma , sleep apnea ,          Cardiovascular hypertension, Rhythm:Regular Rate:Normal     Neuro/Psych    GI/Hepatic   Endo/Other  diabetes  Renal/GU ESRF and DialysisRenal disease     Musculoskeletal   Abdominal (+) + obese,   Peds  Hematology   Anesthesia Other Findings   Reproductive/Obstetrics                           Anesthesia Physical Anesthesia Plan  ASA: III  Anesthesia Plan: General   Post-op Pain Management:    Induction: Intravenous  Airway Management Planned: LMA  Additional Equipment:   Intra-op Plan:   Post-operative Plan: Extubation in OR  Informed Consent: I have reviewed the patients History and Physical, chart, labs and discussed the procedure including the risks, benefits and alternatives for the proposed anesthesia with the patient or authorized representative who has indicated his/her understanding and acceptance.   Dental advisory given  Plan Discussed with: CRNA and Surgeon  Anesthesia Plan Comments:         Anesthesia Quick Evaluation

## 2014-04-28 ENCOUNTER — Encounter (HOSPITAL_COMMUNITY): Payer: Self-pay | Admitting: Vascular Surgery

## 2014-05-02 ENCOUNTER — Encounter: Payer: Self-pay | Admitting: Podiatry

## 2014-05-02 ENCOUNTER — Ambulatory Visit (INDEPENDENT_AMBULATORY_CARE_PROVIDER_SITE_OTHER): Payer: BC Managed Care – PPO | Admitting: Podiatry

## 2014-05-02 VITALS — BP 149/75 | HR 81 | Ht 66.0 in | Wt 287.0 lb

## 2014-05-02 DIAGNOSIS — B351 Tinea unguium: Secondary | ICD-10-CM

## 2014-05-02 DIAGNOSIS — M79609 Pain in unspecified limb: Secondary | ICD-10-CM

## 2014-05-02 DIAGNOSIS — M79606 Pain in leg, unspecified: Secondary | ICD-10-CM | POA: Insufficient documentation

## 2014-05-02 NOTE — Patient Instructions (Signed)
Seen for hypertrophic nails. All nails debrided. Return in 3 months or as needed.  

## 2014-05-02 NOTE — Progress Notes (Signed)
Subjective: Came in for diabetic foot care and long toe nails. Blood sugar was normal 87 in May 27th. A1c runs between 5.9.  Has had surgery to relocate fistular in May.  Requests toe nails trimmed.   Objective: Thick and hypertrophic nails x 10.  Pedal pulses are all palpable.  Nails are all thick and great toe nails are very deformed.  No open skin lesions. No edema or erythema noted.   Assessment: Onychomycosis x 10. Diabetic under control.  Plan: All nails debrided.  Return as needed.

## 2014-06-22 ENCOUNTER — Encounter: Payer: Self-pay | Admitting: Vascular Surgery

## 2014-06-23 ENCOUNTER — Ambulatory Visit (INDEPENDENT_AMBULATORY_CARE_PROVIDER_SITE_OTHER): Payer: Self-pay | Admitting: Vascular Surgery

## 2014-06-23 ENCOUNTER — Encounter: Payer: Self-pay | Admitting: Vascular Surgery

## 2014-06-23 ENCOUNTER — Ambulatory Visit (HOSPITAL_COMMUNITY)
Admission: RE | Admit: 2014-06-23 | Discharge: 2014-06-23 | Disposition: A | Discharge status: Home / Self Care | Payer: BC Managed Care – PPO | Source: Ambulatory Visit | Attending: Vascular Surgery | Admitting: Vascular Surgery

## 2014-06-23 VITALS — BP 149/69 | HR 68 | Ht 66.0 in | Wt 295.0 lb

## 2014-06-23 DIAGNOSIS — N186 End stage renal disease: Secondary | ICD-10-CM

## 2014-06-23 DIAGNOSIS — Z4931 Encounter for adequacy testing for hemodialysis: Secondary | ICD-10-CM

## 2014-06-23 NOTE — Progress Notes (Signed)
This is a 58 year old female returns for followup today. She previously had a left radiocephalic AV fistula placed. She recently had competing branches ligated. This was done 04/27/2014 by my partner Dr. Kellie Simmering. She denies any numbness or tingling in her hand. She is currently not on hemodialysis.  Physical exam:  Filed Vitals:   06/23/14 0953  BP: 149/69  Pulse: 68  Height: 5\' 6"  (1.676 m)  Weight: 295 lb (133.811 kg)  SpO2: 100%    Left upper extremity: Palpable thrill in fistula well-healed incisions fistulas easily palpable to the mid forearm  Data: Duplex ultrasound was performed today which showed there may be some narrowing in the mid distal forearm or a frozen valve leaflet  Assessment: Patent left radiocephalic fistula much improved after side branch ligation possible narrowing but clinically fistula seems to be developing well  Plan: The patient was instructed today on exercising a fistula. Since she is currently not on hemodialysis I am reluctant to perform a fistulogram in her or do further revisions as the fistula clinically appears to be doing well. The patient is scheduled for followup sooner Dr. Lorrene Reid. If dialysis is eminent we could consider a fistulogram if there is any problem with the fistula. Otherwise continued observation. The patient will followup on as-needed basis.

## 2014-08-02 ENCOUNTER — Ambulatory Visit (INDEPENDENT_AMBULATORY_CARE_PROVIDER_SITE_OTHER): Payer: BC Managed Care – PPO | Admitting: Podiatry

## 2014-08-02 ENCOUNTER — Encounter: Payer: Self-pay | Admitting: Podiatry

## 2014-08-02 VITALS — BP 147/76 | HR 82

## 2014-08-02 DIAGNOSIS — B351 Tinea unguium: Secondary | ICD-10-CM

## 2014-08-02 DIAGNOSIS — M79606 Pain in leg, unspecified: Secondary | ICD-10-CM

## 2014-08-02 DIAGNOSIS — M79609 Pain in unspecified limb: Secondary | ICD-10-CM

## 2014-08-02 NOTE — Progress Notes (Signed)
Subjective:  58 year old female presents requesting toe nails trimmed. Nails are very thick and hard with discoloration.  On special diet for poor kidney function.   Objective:  Thick and hypertrophic nails with fungal debrisx 10.  Pedal pulses are all palpable.  Nails are all thick and great toe nails are very deformed.  No open skin lesions. No edema or erythema noted.   Assessment: Onychomycosis x 10.  Diabetic under control.   Plan:  All nails debrided.  Return as needed.

## 2014-08-02 NOTE — Patient Instructions (Signed)
Seen for hypertrophic nails. All nails debrided. Return in 3 months or as needed.  

## 2014-09-30 ENCOUNTER — Ambulatory Visit (INDEPENDENT_AMBULATORY_CARE_PROVIDER_SITE_OTHER): Payer: BC Managed Care – PPO | Admitting: Cardiology

## 2014-09-30 ENCOUNTER — Encounter: Payer: Self-pay | Admitting: Cardiology

## 2014-09-30 VITALS — BP 168/78 | HR 100 | Ht 66.0 in | Wt 302.0 lb

## 2014-09-30 DIAGNOSIS — I10 Essential (primary) hypertension: Secondary | ICD-10-CM

## 2014-09-30 DIAGNOSIS — Z5181 Encounter for therapeutic drug level monitoring: Secondary | ICD-10-CM | POA: Insufficient documentation

## 2014-09-30 DIAGNOSIS — I4891 Unspecified atrial fibrillation: Secondary | ICD-10-CM

## 2014-09-30 DIAGNOSIS — N185 Chronic kidney disease, stage 5: Secondary | ICD-10-CM

## 2014-09-30 DIAGNOSIS — G4733 Obstructive sleep apnea (adult) (pediatric): Secondary | ICD-10-CM

## 2014-09-30 DIAGNOSIS — Z7901 Long term (current) use of anticoagulants: Secondary | ICD-10-CM | POA: Insufficient documentation

## 2014-09-30 NOTE — Assessment & Plan Note (Signed)
Takes her Coumadin as instructed INRs have been therapeutic through her primary care

## 2014-09-30 NOTE — Progress Notes (Signed)
09/30/2014   PCP: Osborne Casco, MD   Chief Complaint  Patient presents with  . Atrial Fibrillation    recurrent episode    Primary Cardiologist: Dr. Fransico Him was a pt of Dr. Verl Blalock  HPI:  58 year old female with hx of PAF here today for recurrent atrial fib.  She has history of PAF on Coumadin, stage V chronic kidney disease with graft in place but no dialysis yet, and obstructive sleep apnea on CPAP.    Last week she had an episode of atrial fib -racing, erratic heart rate that lasted about 24 hours- when she was seen by Dr. Laurann Montana has subsided.  She had no chest pain and no shortness of breath. She does admit not to be wearing her CPAP.  She has not worn it for about 7 months. She also stated she had 2 glasses of cold Tea on the day of the atrial fib.  She also has not been paying attention to her diet lately eating more salty foods. Her blood pressure is elevated today and she admits to not having had her meds as of yet.  She also is not exercising currently.  We discussed the importance of CPAP and that it may directly affect her atrial fibrillation.  She is on Coumadin and takes it regularly and is followed by Dr. Delene Ruffini office.  Allergies  Allergen Reactions  . Iodine Shortness Of Breath  . Shellfish Allergy Shortness Of Breath  . Norvasc [Amlodipine] Swelling    Current Outpatient Prescriptions  Medication Sig Dispense Refill  . acetaminophen (TYLENOL ARTHRITIS PAIN) 650 MG CR tablet Take 650 mg by mouth every 8 (eight) hours as needed. For pain      . aspirin 81 MG tablet Take 81 mg by mouth daily.      . Bisacodyl (CORRECTOL PO) Take 1-2 capsules by mouth daily as needed. For constipation      . calcitRIOL (ROCALTROL) 0.25 MCG capsule Take 1 capsule by mouth daily.       . Calcium Carbonate-Vitamin D (CALCIUM 600 + D PO) Take 2 tablets by mouth daily.      . Cholecalciferol (VITAMIN D-3) 1000 UNITS CAPS Take 1 capsule by mouth daily.        . cloNIDine (CATAPRES) 0.2 MG tablet Take 0.2 mg by mouth 2 (two) times daily.      . furosemide (LASIX) 80 MG tablet Take 80 mg by mouth daily.       Marland Kitchen glimepiride (AMARYL) 2 MG tablet Take 2 mg by mouth daily. With 1st main meal of the day      . losartan (COZAAR) 100 MG tablet TAKE 1 TABLET DAILY  90 tablet  3  . MATZIM LA 180 MG 24 hr tablet Take 180 mg by mouth daily.       . Methylcellulose, Laxative, (FIBER THERAPY PO) Take 2 tablets by mouth daily.      Marland Kitchen oxyCODONE-acetaminophen (TYLOX) 5-500 MG per capsule Take 1 capsule by mouth every 4 (four) hours as needed for pain.  30 capsule  0  . warfarin (COUMADIN) 5 MG tablet Take 5 mg by mouth daily.        No current facility-administered medications for this visit.    Past Medical History  Diagnosis Date  . Diabetes mellitus   . Obesity     s/p panniculectomy  . Dyslipidemia   . Asthma   . Paroxysmal a-fib  a. chronic coumadin;  b. 12/2009 Echo: EF 60-65%, Gr 1 DD.  Marland Kitchen Sleep apnea     a. not using CPAP, last study  >8 yrs  . Sleep apnea   . HTN (hypertension)   . Arthritis   . Epistaxis 11/05/2012  . CKD (chronic kidney disease), stage IV   . Hyperlipidemia   . Anemia   . Hyperparathyroidism due to renal insufficiency   . PCOS (polycystic ovarian syndrome)   . Vitamin D deficiency   . Complication of anesthesia     difficulty with getting oxygen saturation up  . PONV (postoperative nausea and vomiting)   . Seasonal allergies   . Pneumonia     Past Surgical History  Procedure Laterality Date  . Hysterectomy (other)    . Uvuloplasty    . Right knee surgery    . Dilation and curettage of uterus    . Abdominal hysterectomy      with panniculctomy  . Av fistula placement  09/02/2012    Procedure: ARTERIOVENOUS (AV) FISTULA CREATION;  Surgeon: Elam Dutch, MD;  Location: [facility] OR;  Service: Vascular;  Laterality: Left;  Creation of Left Radial-Cephalic Fistula   . Breast surgery      Biopsy right breast  .  Colonoscopy w/ biopsies and polypectomy    . Revison of arteriovenous fistula Left 04/27/2014    Procedure: REVISON OF LEFT RADIAL-CEPHALIC ARTERIOVENOUS FISTULA;  Surgeon: Mal Misty, MD;  Location: [place];  Service: Vascular;  Laterality: Left;    [id number] colds or fevers, continued weight increase Skin:no rashes or ulcers HEENT:no blurred vision, no congestion CV:see HPI PUL:see HPI GI:no diarrhea constipation or melena, no indigestion GU:no hematuria, no dysuria MS:no joint pain, no claudication Neuro:no syncope, no lightheadedness Endo:+ diabetes, no thyroid disease  Wt Readings from Last 3 Encounters:  09/30/14 302 lb (136.986 kg)  06/23/14 295 lb (133.811 kg)  05/02/14 287 lb (130.182 kg)    PHYSICAL EXAM BP 168/78  Pulse 100  Ht 5\' 6"  (1.676 m)  Wt 302 lb (136.986 kg)  BMI 48.77 kg/m2 General:Pleasant affect, NAD Skin:Warm and dry, brisk capillary refill HEENT:normocephalic, sclera clear, mucus membranes moist Neck:supple, no JVD, no bruits  Heart:S1S2 RRR without murmur, gallup, rub or click Lungs:clear without rales, rhonchi, or wheezes [id number], soft, non tender, + BS, do not palpate liver spleen or masses Ext:no lower ext edema, 2+ pedal pulses, 2+ radial pulses Neuro:alert and oriented, MAE, follows commands, + facial symmetry [id number] Did review EKG from Dr. Delene Ruffini office  ASSESSMENT AND PLAN Atrial fibrillation Episode of PAF lasted 24 hours, rapid rate.  We discussed importance of using CPAP, decreasing caffeine. She will begin using cpap.  Also if it reoccurs she will take extra diltiazem to help break the rhythm.  Se will follow up with Dr. Radford Pax in 6 months or earlier if further reoccurrences.      Essential hypertension Has not yet had blood pressure medication today. Her blood pressure was controlled to Dr. Delene Ruffini office  OSA (obstructive sleep apnea) Resume using CPAP. Important.  OBESITY Watch diet, try to exercise.  CKD  (chronic kidney disease) Stage 5, has a graft but not yet on dialysis.  Anticoagulation goal of INR 2 to 3 Takes her Coumadin as instructed INRs have been therapeutic through her primary care

## 2014-09-30 NOTE — Patient Instructions (Signed)
Your physician recommends that you schedule a follow-up appointment in: 6 Months with Dr Radford Pax   Your physician has recommended you make the following change in your medication: If you go into Afib you need to take an extra tablet of Diltiazem and if you start having frequent episode call office to be seen  You need to wear CPAP at night

## 2014-09-30 NOTE — Assessment & Plan Note (Signed)
Episode of PAF lasted 24 hours, rapid rate.  We discussed importance of using CPAP, decreasing caffeine. She will begin using cpap.  Also if it reoccurs she will take extra diltiazem to help break the rhythm.  Se will follow up with Dr. Radford Pax in 6 months or earlier if further reoccurrences.

## 2014-09-30 NOTE — Assessment & Plan Note (Signed)
Has not yet had blood pressure medication today. Her blood pressure was controlled to Dr. Delene Ruffini office

## 2014-09-30 NOTE — Assessment & Plan Note (Signed)
Watch diet, try to exercise.

## 2014-09-30 NOTE — Assessment & Plan Note (Signed)
Resume using CPAP. Important.

## 2014-09-30 NOTE — Assessment & Plan Note (Signed)
Stage 5, has a graft but not yet on dialysis.

## 2014-11-01 ENCOUNTER — Ambulatory Visit (INDEPENDENT_AMBULATORY_CARE_PROVIDER_SITE_OTHER): Payer: BC Managed Care – PPO | Admitting: Podiatry

## 2014-11-01 ENCOUNTER — Encounter: Payer: Self-pay | Admitting: Podiatry

## 2014-11-01 DIAGNOSIS — B351 Tinea unguium: Secondary | ICD-10-CM

## 2014-11-01 DIAGNOSIS — M79606 Pain in leg, unspecified: Secondary | ICD-10-CM

## 2014-11-01 NOTE — Patient Instructions (Signed)
Seen for hypertrophic nails. All nails debrided. Return in 3 months or as needed.  

## 2014-11-01 NOTE — Progress Notes (Signed)
Subjective:  58 year old female presents requesting toe nails trimmed. Nails are very thick and hard with discoloration.  Last blood sugar reading was 90 last month.  On special diet for poor kidney function.   Objective:  Thick and hypertrophic nails with fungal debrisx 10.  Pedal pulses are all palpable.  Nails are all thick and great toe nails are very deformed.  No open skin lesions. No edema or erythema noted.   Assessment: Onychomycosis x 10.  Diabetic under control.   Plan:  All nails debrided.  Return as needed.

## 2014-11-09 ENCOUNTER — Other Ambulatory Visit (HOSPITAL_COMMUNITY): Payer: Self-pay | Admitting: Family Medicine

## 2014-11-09 DIAGNOSIS — Z1231 Encounter for screening mammogram for malignant neoplasm of breast: Secondary | ICD-10-CM

## 2014-11-21 ENCOUNTER — Ambulatory Visit (HOSPITAL_COMMUNITY)
Admission: RE | Admit: 2014-11-21 | Discharge: 2014-11-21 | Disposition: A | Discharge status: Home / Self Care | Payer: BC Managed Care – PPO | Source: Ambulatory Visit | Attending: Family Medicine | Admitting: Family Medicine

## 2014-11-21 DIAGNOSIS — Z1231 Encounter for screening mammogram for malignant neoplasm of breast: Secondary | ICD-10-CM | POA: Insufficient documentation

## 2015-01-04 ENCOUNTER — Encounter: Payer: Self-pay | Admitting: Cardiology

## 2015-01-31 ENCOUNTER — Encounter: Payer: Self-pay | Admitting: Podiatry

## 2015-01-31 ENCOUNTER — Ambulatory Visit (INDEPENDENT_AMBULATORY_CARE_PROVIDER_SITE_OTHER): Payer: BLUE CROSS/BLUE SHIELD | Admitting: Podiatry

## 2015-01-31 DIAGNOSIS — B351 Tinea unguium: Secondary | ICD-10-CM

## 2015-01-31 DIAGNOSIS — M79606 Pain in leg, unspecified: Secondary | ICD-10-CM | POA: Diagnosis not present

## 2015-01-31 NOTE — Progress Notes (Signed)
Subjective:  59 year old female presents requesting toe nails trimmed. Nails are very thick and hard with discoloration.   Objective:  Thick and hypertrophic nails with fungal debris x 10.  Dark hyperpigmented skin both lower limbs. No edema or erythema noted. Pedal pulses are all palpable.  Nails are all thick and great toe nails are very deformed.  No open skin lesions. No edema or erythema noted.   Assessment: Onychomycosis x 10.  Diabetic under control.   Plan:  All nails debrided.  Return as needed.

## 2015-01-31 NOTE — Patient Instructions (Signed)
Seen for hypertrophic nails. All nails debrided. Return in 3 months or as needed.  

## 2015-04-18 ENCOUNTER — Encounter: Payer: Self-pay | Admitting: Cardiology

## 2015-04-18 ENCOUNTER — Ambulatory Visit (INDEPENDENT_AMBULATORY_CARE_PROVIDER_SITE_OTHER): Payer: BLUE CROSS/BLUE SHIELD | Admitting: Cardiology

## 2015-04-18 VITALS — BP 168/74 | HR 97 | Ht 66.0 in | Wt 307.8 lb

## 2015-04-18 DIAGNOSIS — I10 Essential (primary) hypertension: Secondary | ICD-10-CM | POA: Diagnosis not present

## 2015-04-18 DIAGNOSIS — I480 Paroxysmal atrial fibrillation: Secondary | ICD-10-CM | POA: Diagnosis not present

## 2015-04-18 DIAGNOSIS — G4733 Obstructive sleep apnea (adult) (pediatric): Secondary | ICD-10-CM | POA: Diagnosis not present

## 2015-04-18 NOTE — Progress Notes (Signed)
Cardiology Office Note   Date:  04/18/2015   ID:  Brenda Arnold, DOB 12-Jan-1956, MRN [id number]  PCP:  Osborne Casco, MD    Chief Complaint  Patient presents with  . Follow-up    Sleep Apnea      History of Present Illness: Brenda Arnold is a 59 y.o. female with a history of OSA, PAF, obesity and HTN presents today for followup. She is doing well. She tolerates her CPAP device but says she does not use it as much as she should because of going to bed too late and forgetting it. She tolerates the mask well and feels the pressure is adequate but then about 2 hours into her sleep she wakes up and feels like she cannot get enough air. She denies any chest pain or pressure, SOB, DOE, PND or orthopnea.  She rarely has LE edema if she has sat for too long.  She had one episode of what she thought might have been PAF that lasted 5 minutes and resolved.      Past Medical History  Diagnosis Date  . Diabetes mellitus   . Obesity     s/p panniculectomy  . Dyslipidemia   . Asthma   . Paroxysmal a-fib     a. chronic coumadin;  b. 12/2009 Echo: EF 60-65%, Gr 1 DD.  Marland Kitchen Sleep apnea     a. not using CPAP, last study  >8 yrs  . Sleep apnea   . HTN (hypertension)   . Arthritis   . Epistaxis 11/05/2012  . CKD (chronic kidney disease), stage IV   . Hyperlipidemia   . Anemia   . Hyperparathyroidism due to renal insufficiency   . PCOS (polycystic ovarian syndrome)   . Vitamin D deficiency   . Complication of anesthesia     difficulty with getting oxygen saturation up  . PONV (postoperative nausea and vomiting)   . Seasonal allergies   . Pneumonia     Past Surgical History  Procedure Laterality Date  . Hysterectomy (other)    . Uvuloplasty    . Right knee surgery    . Dilation and curettage of uterus    . Abdominal hysterectomy      with panniculctomy  . Av fistula placement  09/02/2012    Procedure: ARTERIOVENOUS (AV) FISTULA CREATION;  Surgeon: Elam Dutch, MD;  Location: [facility] OR;  Service: Vascular;  Laterality: Left;  Creation of Left Radial-Cephalic Fistula   . Breast surgery      Biopsy right breast  . Colonoscopy w/ biopsies and polypectomy    . Revison of arteriovenous fistula Left 04/27/2014    Procedure: REVISON OF LEFT RADIAL-CEPHALIC ARTERIOVENOUS FISTULA;  Surgeon: Mal Misty, MD;  Location: [place];  Service: Vascular;  Laterality: Left;     Current Outpatient Prescriptions  Medication Sig Dispense Refill  . acetaminophen (TYLENOL ARTHRITIS PAIN) 650 MG CR tablet Take 650 mg by mouth every 8 (eight) hours as needed. For pain    . aspirin 81 MG tablet Take 81 mg by mouth daily.    . Bisacodyl (CORRECTOL PO) Take 1-2 capsules by mouth daily as needed. For constipation    . calcitRIOL (ROCALTROL) 0.25 MCG capsule Take 1 capsule by mouth daily.     . Calcium Carbonate-Vitamin D (CALCIUM 600 + D PO) Take 2 tablets by mouth daily.    . Cholecalciferol (VITAMIN D-3) 1000 UNITS CAPS Take 1 capsule by mouth daily.    . cloNIDine (  CATAPRES) 0.2 MG tablet Take 0.2 mg by mouth 2 (two) times daily.    . furosemide (LASIX) 80 MG tablet Take 80 mg by mouth daily.     Marland Kitchen glimepiride (AMARYL) 2 MG tablet Take 2 mg by mouth daily. With 1st main meal of the day    . losartan (COZAAR) 100 MG tablet TAKE 1 TABLET DAILY 90 tablet 3  . MATZIM LA 180 MG 24 hr tablet Take 180 mg by mouth daily.     . Methylcellulose, Laxative, (FIBER THERAPY PO) Take 2 tablets by mouth daily.    Marland Kitchen oxyCODONE-acetaminophen (TYLOX) 5-500 MG per capsule Take 1 capsule by mouth every 4 (four) hours as needed for pain. 30 capsule 0  . warfarin (COUMADIN) 5 MG tablet Take 5 mg by mouth daily. Pt states she takes 1/2 tablet on Tuesday and Thursday. Take 5 mg tablet on Monday, Wednesday, Friday, Saturday and Sunday.     No current facility-administered medications for this visit.    Allergies:   Iodine; Shellfish allergy; and Norvasc    Social History:  The patient   reports that she has never smoked. She has never used smokeless tobacco. She reports that she does not drink alcohol or use illicit drugs.   Family History:  The patient's family history includes Diabetes in her brother; Hypertension in her mother; Kidney disease in her brother; Lung cancer in her father.    ROS:  Please see the history of present illness.   Otherwise, review of systems are positive for none.   All other systems are reviewed and negative.    PHYSICAL EXAM: VS:  BP 168/74 mmHg  Pulse 97  Ht 5\' 6"  (1.676 m)  Wt 307 lb 12.8 oz (139.617 kg)  BMI 49.70 kg/m2  SpO2 97% , BMI Body mass index is 49.7 kg/(m^2). GEN: Well nourished, well developed, in no acute distress HEENT: normal Neck: no JVD, carotid bruits, or masses Cardiac: RRR; no murmurs, rubs, or gallops,no edema  Respiratory:  clear to auscultation bilaterally, normal work of breathing GI: soft, nontender, nondistended, + BS MS: no deformity or atrophy Skin: warm and dry, no rash Neuro:  Strength and sensation are intact Psych: euthymic mood, full affect   EKG:  EKG is not ordered today.    Recent Labs: 04/27/2014: Hemoglobin 12.6; Potassium 3.9; Sodium 142    Lipid Panel No results found for: CHOL, TRIG, HDL, CHOLHDL, VLDL, LDLCALC, LDLDIRECT    Wt Readings from Last 3 Encounters:  04/18/15 307 lb 12.8 oz (139.617 kg)  09/30/14 302 lb (136.986 kg)  06/23/14 295 lb (133.811 kg)    ASSESSMENT AND PLAN:  1. OSA on CPAP - her d/l today showed an AHI of 0.4 per hour on 9cm H2O but only 13% compliance in using more than 4 hours nightly.  She has problems with waking up 2 hours after sleeping and feeling like she cannot breath.  I will add a chin strap to her nasal pillow mask.  She says she is too claustrophobic for a full face mask.   2. HTN - continue clonidine/Diltiazem/Losartan 3. Obesity 4. PAF maintaining NSR - continue Diltiazem/Warfarin  - stop  ASA     Current medicines are reviewed at length with the patient today.  The patient does not have concerns regarding medicines.  The following changes have been made:  no change  Labs/ tests ordered today include: see above assessment and plan No orders of the defined types were placed in this encounter.  Disposition:   FU with me in 6 months   Signed, Sueanne Margarita, MD  04/18/2015 3:46 PM    [facility] [address], [place], [place]  [address] Phone: [phone]; Fax: [phone]

## 2015-04-18 NOTE — Patient Instructions (Signed)
Medication Instructions:  Your physician recommends that you continue on your current medications as directed. Please refer to the Current Medication list given to you today.   Labwork: None  Testing/Procedures: None  Follow-Up: Your physician wants you to follow-up in: 6 months with Dr. Radford Pax. You will receive a reminder letter in the mail two months in advance. If you don't receive a letter, please call our office to schedule the follow-up appointment.   Any Other Special Instructions Will Be Listed Below (If Applicable). You will hear from [place] soon for instructions on getting your chin strap.

## 2015-05-03 ENCOUNTER — Ambulatory Visit: Payer: BLUE CROSS/BLUE SHIELD | Admitting: Podiatry

## 2015-08-15 ENCOUNTER — Encounter: Payer: Self-pay | Admitting: Podiatry

## 2015-08-15 ENCOUNTER — Ambulatory Visit (INDEPENDENT_AMBULATORY_CARE_PROVIDER_SITE_OTHER): Payer: BLUE CROSS/BLUE SHIELD | Admitting: Podiatry

## 2015-08-15 DIAGNOSIS — B351 Tinea unguium: Secondary | ICD-10-CM | POA: Diagnosis not present

## 2015-08-15 DIAGNOSIS — M79606 Pain in leg, unspecified: Secondary | ICD-10-CM | POA: Diagnosis not present

## 2015-08-15 NOTE — Progress Notes (Signed)
Subjective:  59 year old female presents requesting toe nails trimmed. Nails are very thick and over grown. They hurt to walk.  Objective:  Thick and hypertrophic nails with fungal debris x 10.  Dark hyperpigmented skin both lower limbs. No edema or erythema noted. Pedal pulses are all palpable.  Nails are all thick and great toe nails are very deformed.  No open skin lesions. No edema or erythema noted.   Assessment: Onychomycosis and deformed nails x 10.  Diabetic under control.   Plan:  All nails debrided.  Return as needed.

## 2015-08-15 NOTE — Patient Instructions (Signed)
Seen for hypertrophic nails. All nails debrided. Return in 3 months or as needed.  

## 2015-09-26 ENCOUNTER — Other Ambulatory Visit: Payer: Self-pay | Admitting: Physician Assistant

## 2015-09-26 ENCOUNTER — Ambulatory Visit
Admission: RE | Admit: 2015-09-26 | Discharge: 2015-09-26 | Disposition: A | Discharge status: Home / Self Care | Payer: BLUE CROSS/BLUE SHIELD | Source: Ambulatory Visit | Attending: Physician Assistant | Admitting: Physician Assistant

## 2015-09-26 DIAGNOSIS — R52 Pain, unspecified: Secondary | ICD-10-CM

## 2015-11-13 ENCOUNTER — Other Ambulatory Visit: Payer: Self-pay

## 2015-11-13 DIAGNOSIS — Z1231 Encounter for screening mammogram for malignant neoplasm of breast: Secondary | ICD-10-CM

## 2015-11-14 ENCOUNTER — Encounter: Payer: Self-pay | Admitting: Podiatry

## 2015-11-14 ENCOUNTER — Ambulatory Visit (INDEPENDENT_AMBULATORY_CARE_PROVIDER_SITE_OTHER): Payer: BLUE CROSS/BLUE SHIELD | Admitting: Podiatry

## 2015-11-14 DIAGNOSIS — M79606 Pain in leg, unspecified: Secondary | ICD-10-CM | POA: Diagnosis not present

## 2015-11-14 DIAGNOSIS — B351 Tinea unguium: Secondary | ICD-10-CM

## 2015-11-14 NOTE — Progress Notes (Signed)
Subjective:  59 year old female presents requesting toe nails trimmed. Nails are very thick and over grown. They hurt to walk.  Objective:  Thick and hypertrophic nails with fungal debris x 10.  Dark hyperpigmented skin both lower limbs. No edema or erythema noted. Pedal pulses are all palpable.  Nails are all thick and great toe nails are very deformed.  No open skin lesions. No edema or erythema noted.   Assessment: Onychomycosis and deformed nails x 10.  Diabetic under control.   Plan:  All nails debrided.  Return as needed.

## 2015-11-14 NOTE — Patient Instructions (Signed)
Seen for hypertrophic nails. All nails debrided. Return in 3 months or as needed.  

## 2015-11-15 ENCOUNTER — Ambulatory Visit
Admission: RE | Admit: 2015-11-15 | Discharge: 2015-11-15 | Disposition: A | Discharge status: Home / Self Care | Payer: BLUE CROSS/BLUE SHIELD | Source: Ambulatory Visit

## 2015-11-15 DIAGNOSIS — Z1231 Encounter for screening mammogram for malignant neoplasm of breast: Secondary | ICD-10-CM

## 2016-02-12 ENCOUNTER — Ambulatory Visit: Payer: BLUE CROSS/BLUE SHIELD | Admitting: Podiatry

## 2016-02-19 ENCOUNTER — Ambulatory Visit (INDEPENDENT_AMBULATORY_CARE_PROVIDER_SITE_OTHER): Payer: BLUE CROSS/BLUE SHIELD | Admitting: Podiatry

## 2016-02-19 ENCOUNTER — Encounter: Payer: Self-pay | Admitting: Podiatry

## 2016-02-19 DIAGNOSIS — B351 Tinea unguium: Secondary | ICD-10-CM

## 2016-02-19 DIAGNOSIS — M79606 Pain in leg, unspecified: Secondary | ICD-10-CM | POA: Diagnosis not present

## 2016-02-19 NOTE — Patient Instructions (Signed)
Seen for hypertrophic nails. All nails debrided. May benefit from Vitamin A cream for dry skin.  Return in 3 months or as needed.

## 2016-02-19 NOTE — Progress Notes (Signed)
Subjective:  60 year old female presents requesting toe nails trimmed. Nails are very thick and over grown.  Both lower limbs are very dry, scaly and bothers her. She was using Eucerin cream and Vaseline without any improvement. Nails hurt to walk. Stated that she has been diabetic since 2004, but well under control with A1c in 6.   Objective:  Thick and hypertrophic nails with fungal debris x 10.  Depressed hyperpigmented dry circular skin lesions both lower limbs. No edema or erythema noted. Pedal pulses are all palpable.  Nails are all thick and great toe nails are very deformed.  No open skin lesions. No edema or erythema noted.   Assessment: Onychomycosis and deformed nails x 10.  Diabetic under control.  Xerotic skin with punctate dry skin lesions on both lower limbs without open skin.   Plan:  All nails debrided.  Discussed use of Vitamin A cream.  Return as needed.

## 2016-03-22 DIAGNOSIS — I482 Chronic atrial fibrillation: Secondary | ICD-10-CM | POA: Diagnosis not present

## 2016-04-18 DIAGNOSIS — E11319 Type 2 diabetes mellitus with unspecified diabetic retinopathy without macular edema: Secondary | ICD-10-CM | POA: Diagnosis not present

## 2016-04-18 DIAGNOSIS — N185 Chronic kidney disease, stage 5: Secondary | ICD-10-CM | POA: Diagnosis not present

## 2016-04-18 DIAGNOSIS — I482 Chronic atrial fibrillation: Secondary | ICD-10-CM | POA: Diagnosis not present

## 2016-04-18 DIAGNOSIS — E1121 Type 2 diabetes mellitus with diabetic nephropathy: Secondary | ICD-10-CM | POA: Diagnosis not present

## 2016-04-18 DIAGNOSIS — I129 Hypertensive chronic kidney disease with stage 1 through stage 4 chronic kidney disease, or unspecified chronic kidney disease: Secondary | ICD-10-CM | POA: Diagnosis not present

## 2016-05-01 DIAGNOSIS — N2581 Secondary hyperparathyroidism of renal origin: Secondary | ICD-10-CM | POA: Diagnosis not present

## 2016-05-01 DIAGNOSIS — G4733 Obstructive sleep apnea (adult) (pediatric): Secondary | ICD-10-CM | POA: Diagnosis not present

## 2016-05-01 DIAGNOSIS — Z9989 Dependence on other enabling machines and devices: Secondary | ICD-10-CM | POA: Diagnosis not present

## 2016-05-01 DIAGNOSIS — N185 Chronic kidney disease, stage 5: Secondary | ICD-10-CM | POA: Diagnosis not present

## 2016-05-01 DIAGNOSIS — I120 Hypertensive chronic kidney disease with stage 5 chronic kidney disease or end stage renal disease: Secondary | ICD-10-CM | POA: Diagnosis not present

## 2016-05-01 DIAGNOSIS — D631 Anemia in chronic kidney disease: Secondary | ICD-10-CM | POA: Diagnosis not present

## 2016-05-01 DIAGNOSIS — N189 Chronic kidney disease, unspecified: Secondary | ICD-10-CM | POA: Diagnosis not present

## 2016-05-21 ENCOUNTER — Ambulatory Visit: Payer: BLUE CROSS/BLUE SHIELD | Admitting: Podiatry

## 2016-06-05 ENCOUNTER — Ambulatory Visit (INDEPENDENT_AMBULATORY_CARE_PROVIDER_SITE_OTHER): Payer: BLUE CROSS/BLUE SHIELD | Admitting: Podiatry

## 2016-06-05 ENCOUNTER — Encounter: Payer: Self-pay | Admitting: Podiatry

## 2016-06-05 VITALS — BP 160/72 | HR 92

## 2016-06-05 DIAGNOSIS — M79606 Pain in leg, unspecified: Secondary | ICD-10-CM

## 2016-06-05 DIAGNOSIS — B351 Tinea unguium: Secondary | ICD-10-CM

## 2016-06-05 NOTE — Progress Notes (Signed)
Subjective:  59 year old female presents requesting toe nails trimmed. Nails are very thick and over grown.  Nails hurt to walk. Been diabetic since 2004, her last A1cwas 6.2.   Objective:  Thick and hypertrophic nails with fungal debris x 10.  Depressed hyperpigmented dry circular skin lesions both lower limbs. No edema or erythema noted. Pedal pulses are all palpable.  Nails are all thick and great toe nails are very deformed.  No open skin lesions. No edema or erythema noted.   Assessment: Onychomycosis and deformed nails x 10.  Diabetic under control.  Xerotic skin with punctate dry skin lesions on both lower limbs without open skin.   Plan:  All nails debrided.  Return as needed.

## 2016-06-05 NOTE — Patient Instructions (Signed)
Seen for hypertrophic nails. All nails debrided. Return in 3 months or as needed.  

## 2016-06-21 DIAGNOSIS — N189 Chronic kidney disease, unspecified: Secondary | ICD-10-CM | POA: Diagnosis not present

## 2016-06-21 DIAGNOSIS — D631 Anemia in chronic kidney disease: Secondary | ICD-10-CM | POA: Diagnosis not present

## 2016-06-21 DIAGNOSIS — N2581 Secondary hyperparathyroidism of renal origin: Secondary | ICD-10-CM | POA: Diagnosis not present

## 2016-06-21 DIAGNOSIS — I120 Hypertensive chronic kidney disease with stage 5 chronic kidney disease or end stage renal disease: Secondary | ICD-10-CM | POA: Diagnosis not present

## 2016-06-21 DIAGNOSIS — N185 Chronic kidney disease, stage 5: Secondary | ICD-10-CM | POA: Diagnosis not present

## 2016-06-21 DIAGNOSIS — Z9989 Dependence on other enabling machines and devices: Secondary | ICD-10-CM | POA: Diagnosis not present

## 2016-07-05 DIAGNOSIS — N185 Chronic kidney disease, stage 5: Secondary | ICD-10-CM | POA: Diagnosis not present

## 2016-07-05 DIAGNOSIS — L0291 Cutaneous abscess, unspecified: Secondary | ICD-10-CM | POA: Diagnosis not present

## 2016-07-05 DIAGNOSIS — Z5181 Encounter for therapeutic drug level monitoring: Secondary | ICD-10-CM | POA: Diagnosis not present

## 2016-08-08 DIAGNOSIS — N185 Chronic kidney disease, stage 5: Secondary | ICD-10-CM | POA: Diagnosis not present

## 2016-08-08 DIAGNOSIS — N2581 Secondary hyperparathyroidism of renal origin: Secondary | ICD-10-CM | POA: Diagnosis not present

## 2016-08-08 DIAGNOSIS — Z9989 Dependence on other enabling machines and devices: Secondary | ICD-10-CM | POA: Diagnosis not present

## 2016-08-08 DIAGNOSIS — D631 Anemia in chronic kidney disease: Secondary | ICD-10-CM | POA: Diagnosis not present

## 2016-08-08 DIAGNOSIS — I120 Hypertensive chronic kidney disease with stage 5 chronic kidney disease or end stage renal disease: Secondary | ICD-10-CM | POA: Diagnosis not present

## 2016-09-04 DIAGNOSIS — N185 Chronic kidney disease, stage 5: Secondary | ICD-10-CM | POA: Diagnosis not present

## 2016-09-05 ENCOUNTER — Ambulatory Visit (INDEPENDENT_AMBULATORY_CARE_PROVIDER_SITE_OTHER): Payer: BLUE CROSS/BLUE SHIELD | Admitting: Podiatry

## 2016-09-05 ENCOUNTER — Encounter: Payer: Self-pay | Admitting: Podiatry

## 2016-09-05 DIAGNOSIS — Z23 Encounter for immunization: Secondary | ICD-10-CM | POA: Diagnosis not present

## 2016-09-05 DIAGNOSIS — D631 Anemia in chronic kidney disease: Secondary | ICD-10-CM | POA: Diagnosis not present

## 2016-09-05 DIAGNOSIS — M79606 Pain in leg, unspecified: Secondary | ICD-10-CM

## 2016-09-05 DIAGNOSIS — B351 Tinea unguium: Secondary | ICD-10-CM

## 2016-09-05 DIAGNOSIS — I120 Hypertensive chronic kidney disease with stage 5 chronic kidney disease or end stage renal disease: Secondary | ICD-10-CM | POA: Diagnosis not present

## 2016-09-05 DIAGNOSIS — N2581 Secondary hyperparathyroidism of renal origin: Secondary | ICD-10-CM | POA: Diagnosis not present

## 2016-09-05 DIAGNOSIS — N185 Chronic kidney disease, stage 5: Secondary | ICD-10-CM | POA: Diagnosis not present

## 2016-09-05 DIAGNOSIS — G4733 Obstructive sleep apnea (adult) (pediatric): Secondary | ICD-10-CM | POA: Diagnosis not present

## 2016-09-05 NOTE — Patient Instructions (Signed)
Seen for hypertrophic nails. All nails debrided. Vitamin A cream applied to both lower limbs.  Return in 3 months or as needed.

## 2016-09-05 NOTE — Progress Notes (Signed)
Subjective:  60 year old female presents requesting toe nails trimmed. Nails are very thick and over grown.  Nails hurt to walk. Both legs are very dry and have difficulty reaching down to feet and leg. She will have her friend to help her to lotion. Been diabetic since 2004, her last A1c in August was 6.2.   Objective:  Thick and hypertrophic nails with fungal debris x 10.  Dry hyperpigmented leathery skin both lower limbs. No edema or erythema noted. Pedal pulses are all palpable.  Nails are all thick and great toe nails are very deformed.  No open skin lesions. No edema or erythema noted.  No gross deformities in osseous structures.  Assessment: Onychomycosis and deformed nails x 10.  Diabetic under control.  Xerotic skin both lower limbs without open skin.   Plan:  All nails debrided.  Vitamin A cream applied to both lower limbs. Continue daily.  Return as needed.

## 2016-10-16 DIAGNOSIS — D631 Anemia in chronic kidney disease: Secondary | ICD-10-CM | POA: Diagnosis not present

## 2016-10-16 DIAGNOSIS — N189 Chronic kidney disease, unspecified: Secondary | ICD-10-CM | POA: Diagnosis not present

## 2016-10-16 DIAGNOSIS — I120 Hypertensive chronic kidney disease with stage 5 chronic kidney disease or end stage renal disease: Secondary | ICD-10-CM | POA: Diagnosis not present

## 2016-10-16 DIAGNOSIS — N185 Chronic kidney disease, stage 5: Secondary | ICD-10-CM | POA: Diagnosis not present

## 2016-10-16 DIAGNOSIS — N2581 Secondary hyperparathyroidism of renal origin: Secondary | ICD-10-CM | POA: Diagnosis not present

## 2016-10-21 DIAGNOSIS — I129 Hypertensive chronic kidney disease with stage 1 through stage 4 chronic kidney disease, or unspecified chronic kidney disease: Secondary | ICD-10-CM | POA: Diagnosis not present

## 2016-10-21 DIAGNOSIS — E1121 Type 2 diabetes mellitus with diabetic nephropathy: Secondary | ICD-10-CM | POA: Diagnosis not present

## 2016-10-21 DIAGNOSIS — N185 Chronic kidney disease, stage 5: Secondary | ICD-10-CM | POA: Diagnosis not present

## 2016-10-21 DIAGNOSIS — E7800 Pure hypercholesterolemia, unspecified: Secondary | ICD-10-CM | POA: Diagnosis not present

## 2016-10-21 DIAGNOSIS — I482 Chronic atrial fibrillation: Secondary | ICD-10-CM | POA: Diagnosis not present

## 2016-11-14 ENCOUNTER — Other Ambulatory Visit: Payer: Self-pay | Admitting: Family Medicine

## 2016-11-14 DIAGNOSIS — Z1231 Encounter for screening mammogram for malignant neoplasm of breast: Secondary | ICD-10-CM

## 2016-11-19 DIAGNOSIS — Z6841 Body Mass Index (BMI) 40.0 and over, adult: Secondary | ICD-10-CM | POA: Diagnosis not present

## 2016-11-19 DIAGNOSIS — N2581 Secondary hyperparathyroidism of renal origin: Secondary | ICD-10-CM | POA: Diagnosis not present

## 2016-11-19 DIAGNOSIS — N185 Chronic kidney disease, stage 5: Secondary | ICD-10-CM | POA: Diagnosis not present

## 2016-11-19 DIAGNOSIS — I120 Hypertensive chronic kidney disease with stage 5 chronic kidney disease or end stage renal disease: Secondary | ICD-10-CM | POA: Diagnosis not present

## 2016-11-19 DIAGNOSIS — N189 Chronic kidney disease, unspecified: Secondary | ICD-10-CM | POA: Diagnosis not present

## 2016-11-19 DIAGNOSIS — D631 Anemia in chronic kidney disease: Secondary | ICD-10-CM | POA: Diagnosis not present

## 2016-11-20 ENCOUNTER — Ambulatory Visit
Admission: RE | Admit: 2016-11-20 | Discharge: 2016-11-20 | Disposition: A | Discharge status: Home / Self Care | Payer: BLUE CROSS/BLUE SHIELD | Source: Ambulatory Visit | Attending: Family Medicine | Admitting: Family Medicine

## 2016-11-20 DIAGNOSIS — Z1231 Encounter for screening mammogram for malignant neoplasm of breast: Secondary | ICD-10-CM

## 2016-12-05 ENCOUNTER — Ambulatory Visit: Payer: BLUE CROSS/BLUE SHIELD | Admitting: Podiatry

## 2017-01-02 DIAGNOSIS — N2581 Secondary hyperparathyroidism of renal origin: Secondary | ICD-10-CM | POA: Diagnosis not present

## 2017-01-02 DIAGNOSIS — N189 Chronic kidney disease, unspecified: Secondary | ICD-10-CM | POA: Diagnosis not present

## 2017-01-02 DIAGNOSIS — I120 Hypertensive chronic kidney disease with stage 5 chronic kidney disease or end stage renal disease: Secondary | ICD-10-CM | POA: Diagnosis not present

## 2017-01-02 DIAGNOSIS — D631 Anemia in chronic kidney disease: Secondary | ICD-10-CM | POA: Diagnosis not present

## 2017-01-02 DIAGNOSIS — N185 Chronic kidney disease, stage 5: Secondary | ICD-10-CM | POA: Diagnosis not present

## 2017-01-07 ENCOUNTER — Ambulatory Visit (INDEPENDENT_AMBULATORY_CARE_PROVIDER_SITE_OTHER): Payer: BLUE CROSS/BLUE SHIELD | Admitting: Podiatry

## 2017-01-07 DIAGNOSIS — M79671 Pain in right foot: Secondary | ICD-10-CM

## 2017-01-07 DIAGNOSIS — M79672 Pain in left foot: Secondary | ICD-10-CM

## 2017-01-07 DIAGNOSIS — B351 Tinea unguium: Secondary | ICD-10-CM

## 2017-01-07 DIAGNOSIS — L600 Ingrowing nail: Secondary | ICD-10-CM

## 2017-01-07 NOTE — Progress Notes (Signed)
Subjective:  61 year old female presents requesting toe nails trimmed. Nails are very thick and over grown.  Nails hurt to walk. Both legs are very dry and have difficulty reaching down to feet and leg.  Patient can barely walk due to severe arthritic joint pain but insists she would continue to walk rather than using wheel chair.   Objective:  Thick and hypertrophic nails with fungal debris x 10.  Dry hyperpigmented leathery skin both lower limbs. No edema or erythema noted. Pedal pulses are all palpable.  Nails are all thick and great toe nails are very deformed.  No open skin lesions. No edema or erythema noted.  No gross deformities in osseous structures.  Assessment: Onychomycosis and deformed nails x 10.  Diabetic under control.  Xerotic skin both lower limbs without open skin.  Severe arthropathy lower limbs. Difficulty walking.   Plan:  All nails debrided.  Vitamin A cream applied to both lower limbs. Continue daily.  Return as needed.

## 2017-01-07 NOTE — Patient Instructions (Signed)
Seen for painful hypertrophic nails. All nails debrided. Return in 3 months or as needed.

## 2017-01-08 ENCOUNTER — Encounter: Payer: Self-pay | Admitting: Podiatry

## 2017-01-24 ENCOUNTER — Emergency Department (HOSPITAL_COMMUNITY)
Admission: EM | Admit: 2017-01-24 | Discharge: 2017-01-24 | Disposition: A | Discharge status: Home / Self Care | Payer: BLUE CROSS/BLUE SHIELD | Attending: Emergency Medicine | Admitting: Emergency Medicine

## 2017-01-24 ENCOUNTER — Encounter (HOSPITAL_COMMUNITY): Payer: Self-pay | Admitting: Emergency Medicine

## 2017-01-24 DIAGNOSIS — Z7901 Long term (current) use of anticoagulants: Secondary | ICD-10-CM | POA: Insufficient documentation

## 2017-01-24 DIAGNOSIS — R040 Epistaxis: Secondary | ICD-10-CM | POA: Insufficient documentation

## 2017-01-24 DIAGNOSIS — Z79899 Other long term (current) drug therapy: Secondary | ICD-10-CM | POA: Diagnosis not present

## 2017-01-24 DIAGNOSIS — Z7982 Long term (current) use of aspirin: Secondary | ICD-10-CM | POA: Insufficient documentation

## 2017-01-24 DIAGNOSIS — E1122 Type 2 diabetes mellitus with diabetic chronic kidney disease: Secondary | ICD-10-CM | POA: Insufficient documentation

## 2017-01-24 DIAGNOSIS — I129 Hypertensive chronic kidney disease with stage 1 through stage 4 chronic kidney disease, or unspecified chronic kidney disease: Secondary | ICD-10-CM | POA: Diagnosis not present

## 2017-01-24 DIAGNOSIS — N184 Chronic kidney disease, stage 4 (severe): Secondary | ICD-10-CM | POA: Insufficient documentation

## 2017-01-24 DIAGNOSIS — Z7984 Long term (current) use of oral hypoglycemic drugs: Secondary | ICD-10-CM | POA: Insufficient documentation

## 2017-01-24 DIAGNOSIS — J45909 Unspecified asthma, uncomplicated: Secondary | ICD-10-CM | POA: Insufficient documentation

## 2017-01-24 LAB — CBC WITH DIFFERENTIAL/PLATELET
Basophils Absolute: 0 10*3/uL (ref 0.0–0.1)
Basophils Relative: 0 %
Eosinophils Absolute: 0.3 10*3/uL (ref 0.0–0.7)
Eosinophils Relative: 2 %
HCT: 32.4 % — ABNORMAL LOW (ref 36.0–46.0)
Hemoglobin: 9.7 g/dL — ABNORMAL LOW (ref 12.0–15.0)
Lymphocytes Relative: 22 %
Lymphs Abs: 3.9 10*3/uL (ref 0.7–4.0)
MCH: 28.9 pg (ref 26.0–34.0)
MCHC: 29.9 g/dL — ABNORMAL LOW (ref 30.0–36.0)
MCV: 96.4 fL (ref 78.0–100.0)
Monocytes Absolute: 1.4 10*3/uL — ABNORMAL HIGH (ref 0.1–1.0)
Monocytes Relative: 8 %
Neutro Abs: 11.7 10*3/uL — ABNORMAL HIGH (ref 1.7–7.7)
Neutrophils Relative %: 68 %
Platelets: 267 10*3/uL (ref 150–400)
RBC: 3.36 MIL/uL — ABNORMAL LOW (ref 3.87–5.11)
RDW: 13.9 % (ref 11.5–15.5)
WBC: 17.3 10*3/uL — ABNORMAL HIGH (ref 4.0–10.5)

## 2017-01-24 LAB — PROTIME-INR
INR: 3.8
Prothrombin Time: 38.4 seconds — ABNORMAL HIGH (ref 11.4–15.2)

## 2017-01-24 LAB — BASIC METABOLIC PANEL
Anion gap: 12 (ref 5–15)
Chloride: 108 mmol/L (ref 101–111)
Creatinine, Ser: 6.88 mg/dL — ABNORMAL HIGH (ref 0.44–1.00)
GFR calc Af Amer: 7 mL/min — ABNORMAL LOW (ref >60)
GFR calc non Af Amer: 6 mL/min — ABNORMAL LOW (ref >60)
Glucose, Bld: 133 mg/dL — ABNORMAL HIGH (ref 65–99)
Potassium: 4.3 mmol/L (ref 3.5–5.1)
Sodium: 141 mmol/L (ref 135–145)

## 2017-01-24 LAB — BASIC METABOLIC PANEL WITH GFR
BUN: 77 mg/dL — ABNORMAL HIGH (ref 6–20)
CO2: 21 mmol/L — ABNORMAL LOW (ref 22–32)
Calcium: 10 mg/dL (ref 8.9–10.3)

## 2017-01-24 LAB — APTT: aPTT: 48 seconds — ABNORMAL HIGH (ref 24–36)

## 2017-01-24 LAB — SAMPLE TO BLOOD BANK

## 2017-01-24 MED ORDER — VITAMIN K1 10 MG/ML IJ SOLN
1.0000 mg | Freq: Once | INTRAVENOUS | Status: AC
  Administered 2017-01-24: 1 mg via INTRAVENOUS
  Filled 2017-01-24: qty 0.1

## 2017-01-24 MED ORDER — CEPHALEXIN 250 MG PO CAPS
500.0000 mg | ORAL_CAPSULE | Freq: Once | ORAL | Status: AC
  Administered 2017-01-24: 500 mg via ORAL
  Filled 2017-01-24: qty 2

## 2017-01-24 MED ORDER — OXYMETAZOLINE HCL 0.05 % NA SOLN
1.0000 | Freq: Once | NASAL | Status: AC
  Administered 2017-01-24: 1 via NASAL
  Filled 2017-01-24: qty 15

## 2017-01-24 MED ORDER — LORAZEPAM 2 MG/ML IJ SOLN
1.0000 mg | Freq: Once | INTRAMUSCULAR | Status: AC
  Administered 2017-01-24: 1 mg via INTRAVENOUS
  Filled 2017-01-24: qty 1

## 2017-01-24 MED ORDER — CEPHALEXIN 500 MG PO CAPS: 500.0000 mg | ORAL_CAPSULE | Freq: Two times a day (BID) | ORAL | 0 refills | Status: DC

## 2017-01-24 MED ORDER — LORAZEPAM 1 MG PO TABS
1.0000 mg | ORAL_TABLET | Freq: Once | ORAL | Status: AC
  Administered 2017-01-24: 1 mg via ORAL
  Filled 2017-01-24: qty 1

## 2017-01-24 MED ORDER — LORAZEPAM 1 MG PO TABS: 1.0000 mg | ORAL_TABLET | Freq: Three times a day (TID) | ORAL | 0 refills | Status: DC | PRN

## 2017-01-24 MED ORDER — PHENYLEPHRINE HCL 0.5 % NA SOLN
1.0000 [drp] | Freq: Once | NASAL | Status: AC
  Administered 2017-01-24: 1 [drp] via NASAL
  Filled 2017-01-24: qty 15

## 2017-01-24 NOTE — ED Provider Notes (Signed)
By signing my name below, I, Avnee Patel, attest that this documentation has been prepared under the direction and in the presence of Johnston, DO  Electronically Signed: Delton Prairie, ED Scribe. 01/24/17. 3:16 AM.  TIME SEEN: 2:59 AM  CHIEF COMPLAINT:  Chief Complaint  Patient presents with  . Epistaxis  . Hematemesis    HPI:   DEBBRAH SAMPEDRO is a 61 y.o. female, with a hx of A-fib on Jantoven, CKD, HTN, hyperlipidemia and DM, who presents to the Emergency Department complaining of an acute onset nose bleed from left nostril which began around 12 AM today. She also reports nausea and vomiting secondary to the nosebleed. No alleviating factors noted. She denies any trauma to her face, any nasal medication use, not been picking her nose or any other associated symptoms. Pt notes a hx of a nosebleed in 2013 for which she was admitted to the hospital which did not require a blood transfusion or surgery. Pt is currently on a blood thinner, Jantoven (which is warfarin). No other complaints noted.   ROS: See HPI Constitutional: no fever  Eyes: no drainage  ENT: no runny nose   Cardiovascular:  no chest pain  Resp: no SOB  GI: +vomiting GU: no dysuria Integumentary: no rash  Allergy: no hives  Musculoskeletal: no leg swelling  Neurological: no slurred speech ROS otherwise negative  PAST MEDICAL HISTORY/PAST SURGICAL HISTORY:  Past Medical History:  Diagnosis Date  . Anemia   . Arthritis   . Asthma   . CKD (chronic kidney disease), stage IV ([place])   . Complication of anesthesia    difficulty with getting oxygen saturation up  . Diabetes mellitus   . Dyslipidemia   . Epistaxis 11/05/2012  . HTN (hypertension)   . Hyperlipidemia   . Hyperparathyroidism due to renal insufficiency ([place])   . Obesity    s/p panniculectomy  . Paroxysmal a-fib ([facility])    a. chronic coumadin;  b. 12/2009 Echo: EF 60-65%, Gr 1 DD.  Marland Kitchen PCOS (polycystic ovarian syndrome)   . Pneumonia   . PONV  (postoperative nausea and vomiting)   . Seasonal allergies   . Sleep apnea    a. not using CPAP, last study  >8 yrs  . Sleep apnea   . Vitamin D deficiency     MEDICATIONS:  Prior to Admission medications   Medication Sig Start Date End Date Taking? Authorizing Provider  acetaminophen (TYLENOL ARTHRITIS PAIN) 650 MG CR tablet Take 650 mg by mouth every 8 (eight) hours as needed. For pain    Historical Provider, MD  aspirin 81 MG tablet Take 81 mg by mouth daily.    Historical Provider, MD  Bisacodyl (CORRECTOL PO) Take 1-2 capsules by mouth daily as needed. For constipation    Historical Provider, MD  calcitRIOL (ROCALTROL) 0.25 MCG capsule Take 1 capsule by mouth daily.  08/27/11   Historical Provider, MD  Calcium Carbonate-Vitamin D (CALCIUM 600 + D PO) Take 2 tablets by mouth daily.    Historical Provider, MD  Cholecalciferol (VITAMIN D-3) 1000 UNITS CAPS Take 1 capsule by mouth daily.    Historical Provider, MD  cloNIDine (CATAPRES) 0.2 MG tablet Take 0.2 mg by mouth 2 (two) times daily.    Historical Provider, MD  furosemide (LASIX) 80 MG tablet Take 80 mg by mouth daily.     Historical Provider, MD  glimepiride (AMARYL) 2 MG tablet Take 2 mg by mouth daily. With 1st main meal of the day 06/16/11  Historical Provider, MD  losartan (COZAAR) 100 MG tablet TAKE 1 TABLET DAILY 06/16/13   Renella Cunas, MD  MATZIM LA 180 MG 24 hr tablet Take 180 mg by mouth daily.  05/04/14   Historical Provider, MD  Methylcellulose, Laxative, (FIBER THERAPY PO) Take 2 tablets by mouth daily.    Historical Provider, MD  oxyCODONE-acetaminophen (TYLOX) 5-500 MG per capsule Take 1 capsule by mouth every 4 (four) hours as needed for pain. 04/27/14   Mal Misty, MD  warfarin (COUMADIN) 5 MG tablet Take 5 mg by mouth daily. Pt states she takes 1/2 tablet on Tuesday and Thursday. Take 5 mg tablet on Monday, Wednesday, Friday, Saturday and Sunday.    Historical Provider, MD    ALLERGIES:  Allergies  Allergen  Reactions  . Iodine Shortness Of Breath  . Shellfish Allergy Shortness Of Breath  . Norvasc [Amlodipine] Swelling    SOCIAL HISTORY:  Social History  Substance Use Topics  . Smoking status: Never Smoker  . Smokeless tobacco: Never Used  . Alcohol use No    FAMILY HISTORY: Family History  Problem Relation Age of Onset  . Lung cancer Father     died @ 19  . Hypertension Mother     alive @ 85  . Diabetes Brother   . Kidney disease Brother     EXAM: BP (!) 205/86 (BP Location: Right Arm)   Pulse 112   Resp 22   Ht 5\' 6"  (1.676 m)   Wt (!) 306 lb (138.8 kg)   SpO2 100%   BMI 49.39 kg/m  CONSTITUTIONAL: Alert and oriented and responds appropriately to questions. Well-appearing; well-nourished, Obese, appears anxious but not in significant distress HEAD: Normocephalic EYES: Conjunctivae clear, PERRL, EOMI ENT: normal nose; no rhinorrhea; moist mucous membranes. Large amount of bright red blood coming out of left nostril.  Unable to visualize the posterior oropharynx given she is having a large amount of blood come out of her mouth. NECK: Supple, no meningismus, no nuchal rigidity, no LAD  CARD: RRR; S1 and S2 appreciated; no murmurs, no clicks, no rubs, no gallops RESP: Normal chest excursion without splinting or tachypnea; breath sounds clear and equal bilaterally; no wheezes, no rhonchi, no rales, no hypoxia or respiratory distress, speaking full sentences ABD/GI: Normal bowel sounds; non-distended; soft, non-tender, no rebound, no guarding, no peritoneal signs, no hepatosplenomegaly BACK:  The back appears normal and is non-tender to palpation, there is no CVA tenderness EXT: Normal ROM in all joints; non-tender to palpation; no edema; normal capillary refill; no cyanosis, no calf tenderness or swelling    SKIN: Normal color for age and race; warm; no rash NEURO: Moves all extremities equally, sensation to light touch intact diffusely, cranial nerves II through XII intact,  normal speech PSYCH: The patient's mood and manner are appropriate. Grooming and personal hygiene are appropriate.   MEDICAL DECISION MAKING: Patient here with nosebleed. She is on Coumadin. She is hypertensive and appears anxious but I'm able to get her blood pressure down when she has more calm. We have tried spraying Afrin and phenylephrine in her nose all have her hold pressure for 30 minutes that she would like to not have nasal packing as she has had it once before and states it was very uncomfortable.  ED PROGRESS: Patient bleeding still after 30 minutes of direct pressure. We will place nasal packing with a posterior nasal tampon, double balloon.   4:25 AM  Pt bleeding through her packing.  Discussed  with Dr. Redmond Baseman on call for ENT. We appreciate his help. Patient is hypertensive, appears very anxious. Her coags are currently pending. He recommends further monitoring, fixing coagulopathy if necessary and treating her anxiety. We'll give IV Ativan.    Patient's blood pressure has significant improved after IV Ativan she is now calm and relaxed. After further monitoring and appears her bleeding has stopped but has very mild amount of blood noted in the posterior oropharynx which is likely from residual clot. Her INR was 3.8. Have discussed with pharmacy who recommends giving 1 mg of IV vitamin K to bring her INR back to a therapeutic range between 2-3 given her history of atrial fibrillation. Patient does have chronic kidney disease and has creatinine of 6.88 which she states is her baseline. She does have a graft in the left upper extremity but is not yet on dialysis. She reports feeling much better. She has history of chronic anemia and her hemoglobin today is stable. Does have a leukocytosis but suspect that this is reactive. We will start her on Keflex and have her follow-up with Dr. Redmond Baseman. I have contacted Dr. Redmond Baseman again he was in agreement with this plan and states he can see patient in his  office next week. We'll discharge with short course of Ativan as well given patient states she is very claustrophobic with the nasal packing in place. Will also discharge with prescription for Keflex. Discussed at length bleeding return precautions. Patient and friend at bedside are comfortable with this plan.    At this time, I do not feel there is any life-threatening condition present. I have reviewed and discussed all results (EKG, imaging, lab, urine as appropriate) and exam findings with patient/family. I have reviewed nursing notes and appropriate previous records.  I feel the patient is safe to be discharged home without further emergent workup and can continue workup as an outpatient as needed. Discussed usual and customary return precautions. Patient/family verbalize understanding and are comfortable with this plan.  Outpatient follow-up has been provided. All questions have been answered.     Marland KitchenEpistaxis Management Date/Time: 01/24/2017 7:03 AM Performed by: Nyra Jabs Authorized by: Nyra Jabs   Consent:    Consent obtained:  Verbal   Consent given by:  Patient   Risks discussed:  Bleeding, infection, nasal injury and pain   Alternatives discussed:  Delayed treatment and no treatment Anesthesia (see MAR for exact dosages):    Anesthesia method:  None Procedure details:    Treatment site:  L posterior   Treatment method:  Nasal balloon; lidocaine, nasal phenylephrine, Afrin   Treatment complexity:  Extensive   Treatment episode: recurring   Post-procedure details:    Assessment:  Bleeding reoccurred after nasal phenylephrine and Afrin and direct pressure. Bleeding ultimately stopped after posterior nasal packing with double balloon placed    Patient tolerance of procedure:  Tolerated with difficulty     Forbes, DO 01/24/17 [phone]

## 2017-01-24 NOTE — ED Triage Notes (Signed)
Pt presents to ED after having a nose bleed x 2 in the past hour.  PAtient has been tilting her head back and now is vomiting up bright red blood.  Pt c/o nausea.   Denies pain.

## 2017-01-24 NOTE — ED Notes (Signed)
ENT cart to bedside

## 2017-01-24 NOTE — Discharge Instructions (Signed)
Please follow-up with ENT to have packing removed. Please take antibiotics until you're instructed to stop them or until complete. If you begin to have rebleeding, please return to the emergency department.

## 2017-01-24 NOTE — ED Notes (Signed)
Pt taken to bathroom via wheelchair. After arriving to room, pt c/o lightheadedness and anxiety. Lips noted to be pale. bp 95/42. Pt given po ativan. Began to hyperventilate. Pt calmed and instructed to slow breathing. Pt assisted back to bed. bp improving. IV placed and additional IV ativan given

## 2017-01-28 DIAGNOSIS — R040 Epistaxis: Secondary | ICD-10-CM | POA: Diagnosis not present

## 2017-02-05 ENCOUNTER — Telehealth: Payer: Self-pay | Admitting: Cardiology

## 2017-02-05 DIAGNOSIS — I120 Hypertensive chronic kidney disease with stage 5 chronic kidney disease or end stage renal disease: Secondary | ICD-10-CM | POA: Diagnosis not present

## 2017-02-05 DIAGNOSIS — N189 Chronic kidney disease, unspecified: Secondary | ICD-10-CM | POA: Diagnosis not present

## 2017-02-05 DIAGNOSIS — D631 Anemia in chronic kidney disease: Secondary | ICD-10-CM | POA: Diagnosis not present

## 2017-02-05 DIAGNOSIS — N185 Chronic kidney disease, stage 5: Secondary | ICD-10-CM | POA: Diagnosis not present

## 2017-02-05 DIAGNOSIS — N2581 Secondary hyperparathyroidism of renal origin: Secondary | ICD-10-CM | POA: Diagnosis not present

## 2017-02-05 NOTE — Telephone Encounter (Signed)
Patient's sister called to report SOB and chest tightness on exertion for a while. They are at Dr. Sanda Klein office (nephrology) and she said to call and get an appointment with Cardiology. Spoke with the patient, who states she has a sedentary lifestyle. She works at home and sits all day. She reports DOE for 6 months to 1 year. She states she gets winded when walking but it resolves with rest. She states her heart races as well when she moves. She has no VS to report. She denies any symptoms right now. She will bring her medications to appointment tomorrow.  Scheduled patient for overdue OV tomorrow with [facility] Silos, [place]. Patient agrees with treatment plan and was grateful for assistance.

## 2017-02-05 NOTE — Telephone Encounter (Signed)
New message    Pt c/o of Chest Pain: STAT if CP now or developed within 24 hours  1. Are you having CP right now?  Not now but earlier  2. Are you experiencing any other symptoms (ex. SOB, nausea, vomiting, sweating)? sob 3. How long have you been experiencing CP?  Hx is unclear but pt is at the nephrologist and has been having sob and chest pain while there  4. Is your CP continuous or coming and going?  With exertion 5. Have you taken Nitroglycerin?  ?no

## 2017-02-05 NOTE — Progress Notes (Deleted)
Cardiology Office Note    Date:  02/05/2017   ID:  Brenda Arnold, DOB [birth year], MRN [id number]  PCP:  Osborne Casco, MD  Cardiologist:  Dr. Radford Pax  CC: chest pain  / SOB  History of Present Illness:  Brenda Arnold is a 61 y.o. female with a history of  OSA, PAF, obesity, ESRD, and HTN who presents to clinic for evaluation of exertional chest pain and SOB.     Past Medical History:  Diagnosis Date  . Anemia   . Arthritis   . Asthma   . CKD (chronic kidney disease), stage IV ([place])   . Complication of anesthesia    difficulty with getting oxygen saturation up  . Diabetes mellitus   . Dyslipidemia   . Epistaxis 11/05/2012  . HTN (hypertension)   . Hyperlipidemia   . Hyperparathyroidism due to renal insufficiency ([place])   . Obesity    s/p panniculectomy  . Paroxysmal a-fib ([facility])    a. chronic coumadin;  b. 12/2009 Echo: EF 60-65%, Gr 1 DD.  Marland Kitchen PCOS (polycystic ovarian syndrome)   . Pneumonia   . PONV (postoperative nausea and vomiting)   . Seasonal allergies   . Sleep apnea    a. not using CPAP, last study  >8 yrs  . Sleep apnea   . Vitamin D deficiency     Past Surgical History:  Procedure Laterality Date  . ABDOMINAL HYSTERECTOMY     with panniculctomy  . AV FISTULA PLACEMENT  09/02/2012   Procedure: ARTERIOVENOUS (AV) FISTULA CREATION;  Surgeon: Elam Dutch, MD;  Location: [facility] OR;  Service: Vascular;  Laterality: Left;  Creation of Left Radial-Cephalic Fistula   . BREAST SURGERY     Biopsy right breast  . COLONOSCOPY W/ BIOPSIES AND POLYPECTOMY    . DILATION AND CURETTAGE OF UTERUS    . hysterectomy (other)    . REVISON OF ARTERIOVENOUS FISTULA Left 04/27/2014   Procedure: REVISON OF LEFT RADIAL-CEPHALIC ARTERIOVENOUS FISTULA;  Surgeon: Mal Misty, MD;  Location: [facility];  Service: Vascular;  Laterality: Left;  . right knee surgery    . uvuloplasty      Current Medications: Outpatient Medications Prior to Visit  Medication Sig  Dispense Refill  . acetaminophen (TYLENOL ARTHRITIS PAIN) 650 MG CR tablet Take 650 mg by mouth every 8 (eight) hours as needed. For pain    . aspirin 81 MG tablet Take 81 mg by mouth daily.    . Bisacodyl (CORRECTOL PO) Take 1-2 capsules by mouth daily as needed. For constipation    . calcitRIOL (ROCALTROL) 0.25 MCG capsule Take 1 capsule by mouth daily.     . Calcium Carbonate-Vitamin D (CALCIUM 600 + D PO) Take 2 tablets by mouth daily.    . cephALEXin (KEFLEX) 500 MG capsule Take 1 capsule (500 mg total) by mouth 2 (two) times daily. 14 capsule 0  . Cholecalciferol (VITAMIN D-3) 1000 UNITS CAPS Take 1 capsule by mouth daily.    . cloNIDine (CATAPRES) 0.2 MG tablet Take 0.2 mg by mouth 2 (two) times daily.    . furosemide (LASIX) 80 MG tablet Take 80 mg by mouth daily.     Marland Kitchen glimepiride (AMARYL) 2 MG tablet Take 2 mg by mouth daily. With 1st main meal of the day    . LORazepam (ATIVAN) 1 MG tablet Take 1-2 tablets (1-2 mg total) by mouth every 8 (eight) hours as needed for anxiety. 10 tablet 0  . losartan (COZAAR) 100  MG tablet TAKE 1 TABLET DAILY 90 tablet 3  . MATZIM LA 180 MG 24 hr tablet Take 180 mg by mouth daily.     . Methylcellulose, Laxative, (FIBER THERAPY PO) Take 2 tablets by mouth daily.    Marland Kitchen oxyCODONE-acetaminophen (TYLOX) 5-500 MG per capsule Take 1 capsule by mouth every 4 (four) hours as needed for pain. 30 capsule 0  . warfarin (COUMADIN) 5 MG tablet Take 5 mg by mouth daily. Pt states she takes 1/2 tablet on Tuesday and Thursday. Take 5 mg tablet on Monday, Wednesday, Friday, Saturday and Sunday.     No facility-administered medications prior to visit.      Allergies:   Iodine; Shellfish allergy; and Norvasc [amlodipine]   Social History   Social History  . Marital status: Single    Spouse name: N/A  . Number of children: N/A  . Years of education: N/A   Social History Main Topics  . Smoking status: Never Smoker  . Smokeless tobacco: Never Used  . Alcohol use  No  . Drug use: No  . Sexual activity: Not on file   Other Topics Concern  . Not on file   Social History Narrative   Lives in [place] alone. 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     Family History:  The patient's family history includes Diabetes in her brother; Hypertension in her mother; Kidney disease in her brother; Lung cancer in her father.      *** ROS/PE    Wt Readings from Last 3 Encounters:  01/24/17 (!) 306 lb (138.8 kg)  04/18/15 (!) 307 lb 12.8 oz (139.6 kg)  09/30/14 (!) 302 lb (137 kg)      Studies/Labs Reviewed:   EKG:  EKG is*** ordered today.  The ekg ordered today demonstrates ***  Recent Labs: 01/24/2017: BUN 77; Creatinine, Ser 6.88; Hemoglobin 9.7; Platelets 267; Potassium 4.3; Sodium 141   Lipid Panel No results found for: CHOL, TRIG, HDL, CHOLHDL, VLDL, LDLCALC, LDLDIRECT  Additional studies/ records that were reviewed today include:  none   ASSESSMENT & PLAN:   CP/SOB:  HTN:   HLD:  ESRD:    Medication Adjustments/Labs and Tests Ordered: Current medicines are reviewed at length with the patient today.  Concerns regarding medicines are outlined above.  Medication changes, Labs and Tests ordered today are listed in the Patient Instructions below. There are no Patient Instructions on file for this visit.   Signed, Angelena Form, PA-C  02/05/2017 9:00 PM    [facility] [address], [place], [place]  [address] Phone: [phone]; Fax: [phone]

## 2017-02-06 ENCOUNTER — Ambulatory Visit: Payer: BLUE CROSS/BLUE SHIELD | Admitting: Physician Assistant

## 2017-02-07 ENCOUNTER — Ambulatory Visit (HOSPITAL_COMMUNITY)
Admission: RE | Admit: 2017-02-07 | Discharge: 2017-02-07 | Disposition: A | Discharge status: Home / Self Care | Payer: BLUE CROSS/BLUE SHIELD | Source: Ambulatory Visit | Attending: Nephrology | Admitting: Nephrology

## 2017-02-07 VITALS — BP 142/63 | HR 75 | Temp 97.7°F | Resp 18

## 2017-02-07 DIAGNOSIS — D631 Anemia in chronic kidney disease: Secondary | ICD-10-CM | POA: Insufficient documentation

## 2017-02-07 DIAGNOSIS — N189 Chronic kidney disease, unspecified: Secondary | ICD-10-CM | POA: Diagnosis not present

## 2017-02-07 LAB — ABO/RH: ABO/RH(D): A POS

## 2017-02-07 LAB — PREPARE RBC (CROSSMATCH)

## 2017-02-07 MED ORDER — SODIUM CHLORIDE 0.9 % IV SOLN
Freq: Once | INTRAVENOUS | Status: AC
  Administered 2017-02-07: 10:00:00 via INTRAVENOUS

## 2017-02-07 NOTE — Progress Notes (Addendum)
Procedure: Transfusion of 1 unit PRBCs  Ordering Provider: Jamal Maes, MD  Diagnosis: Anemia of Renal Disease N18.9  Pt tolerated well; no complications noted

## 2017-02-07 NOTE — Discharge Instructions (Signed)

## 2017-02-10 LAB — BPAM RBC
Blood Product Expiration Date: 201803292359
ISSUE DATE / TIME: 201803091003
Unit Type and Rh: 6200

## 2017-02-10 LAB — TYPE AND SCREEN
ABO/RH(D): A POS
Antibody Screen: NEGATIVE
Unit division: 0

## 2017-02-12 ENCOUNTER — Ambulatory Visit (INDEPENDENT_AMBULATORY_CARE_PROVIDER_SITE_OTHER): Payer: BLUE CROSS/BLUE SHIELD | Admitting: Physician Assistant

## 2017-02-12 ENCOUNTER — Encounter (INDEPENDENT_AMBULATORY_CARE_PROVIDER_SITE_OTHER): Payer: Self-pay

## 2017-02-12 ENCOUNTER — Encounter: Payer: Self-pay | Admitting: Physician Assistant

## 2017-02-12 VITALS — BP 140/30 | HR 95 | Ht 67.0 in | Wt 302.4 lb

## 2017-02-12 DIAGNOSIS — G4733 Obstructive sleep apnea (adult) (pediatric): Secondary | ICD-10-CM

## 2017-02-12 DIAGNOSIS — N186 End stage renal disease: Secondary | ICD-10-CM

## 2017-02-12 DIAGNOSIS — I480 Paroxysmal atrial fibrillation: Secondary | ICD-10-CM

## 2017-02-12 DIAGNOSIS — R0602 Shortness of breath: Secondary | ICD-10-CM

## 2017-02-12 DIAGNOSIS — I10 Essential (primary) hypertension: Secondary | ICD-10-CM

## 2017-02-12 NOTE — Patient Instructions (Addendum)
Medication Instructions:  Your physician recommends that you continue on your current medications as directed. Please refer to the Current Medication list given to you today.  Labwork: NONE  Testing/Procedures: 1. Your physician has requested that you have an echocardiogram. Echocardiography is a painless test that uses sound waves to create images of your heart. It provides your doctor with information about the size and shape of your heart and how well your heart's chambers and valves are working. This procedure takes approximately one hour. There are no restrictions for this procedure. 2. Your physician has requested that you have a lexiscan myoview. For further information please visit [email or web address]. Please follow instruction sheet, as given.  Follow-Up: DR. Radford Pax IN ABOUT 3 WEEKS AFTER ALL TEST HAVE BEEN COMPLETED  Any Other Special Instructions Will Be Listed Below (If Applicable).  If you need a refill on your cardiac medications before your next appointment, please call your pharmacy.

## 2017-02-12 NOTE — Progress Notes (Signed)
Cardiology Office Note:    Date:  02/12/2017   ID:  Brenda Arnold, DOB 1956/06/08, MRN [id number]  PCP:  Osborne Casco, MD  Cardiologist:  Dr. Jenell Milliner >> Dr. Fransico Him   Electrophysiologist:  n/a Nephrologist: Dr. Lorrene Reid  Referring MD: Jamal Maes, MD   Chief Complaint  Patient presents with  . Shortness of Breath    History of Present Illness:    Brenda Arnold is a 61 y.o. female with a hx of Parox AF, OSA, DM, HL, CKD stage 4, HTN, obesity.  Patient had chest pain in the setting of atrial fibrillation in 2011. Myoview was obtained and read out with inferolateral ischemia. However, this was reviewed further in the office at follow-up and felt to be, overall, low risk. Given her chronic kidney disease and low risk nuclear stress test, cardiac cath was deferred and medical therapy was recommended.  She is referred by her nephrologist for further evaluation of shortness of breath and chest discomfort. She's here today with her mother and 2 sisters. Patient notes chronic, severe dyspnea with exertion over the past 6-12 months. She had an episode of severe epistaxis in late February prompting a visit to the emergency room. She was treated with nasal packing. Her hemoglobin dropped below 8 and she was given 1 unit of PRBCs. Prior to this, she noted worsening dyspnea with just minimal activity as well as chest discomfort. Chest discomfort is difficult for her to describe. She mainly noted rapid and hard heartbeats. Since blood transfusion, she notes improved symptoms. However, her family feels that she continues to exhibit severe dyspnea on exertion.  She sleeps on 3 pillows chronically. She denies PND. LE edema is chronic and stable. She denies any dizziness or syncope. She denies any further bleeding issues. She does occasionally wheeze when she is short of breath. Her stools have remained somewhat dark/black since her episode of epistaxis.  Prior CV studies:   The  following studies were reviewed today:  Myoview 1/11 EF 65, inferolateral ischemia *Study reviewed by cardiologist in office at follow-up appointment - felt to be low risk, medical therapy recommended*  Echo 1/11 Mild LVH, EF 60-60, normal wall motion, grade 1 diastolic dysfunction, trivial pericardial effusion  Past Medical History:  Diagnosis Date  . Anemia   . Arthritis   . Asthma   . CKD (chronic kidney disease), stage IV ([place])   . Complication of anesthesia    difficulty with getting oxygen saturation up  . Diabetes mellitus   . Dyslipidemia   . Epistaxis 11/05/2012  . HTN (hypertension)   . Hyperlipidemia   . Hyperparathyroidism due to renal insufficiency ([place])   . Obesity    s/p panniculectomy  . Paroxysmal a-fib (HCC)    a. chronic coumadin;  b. 12/2009 Echo: EF 60-65%, Gr 1 DD.  Marland Kitchen PCOS (polycystic ovarian syndrome)   . Pneumonia   . Seasonal allergies   . Sleep apnea    a. not using CPAP, last study  >8 yrs  . Vitamin D deficiency     Past Surgical History:  Procedure Laterality Date  . ABDOMINAL HYSTERECTOMY     with panniculctomy  . AV FISTULA PLACEMENT  09/02/2012   Procedure: ARTERIOVENOUS (AV) FISTULA CREATION;  Surgeon: Elam Dutch, MD;  Location: [facility] OR;  Service: Vascular;  Laterality: Left;  Creation of Left Radial-Cephalic Fistula   . BREAST SURGERY     Biopsy right breast  . COLONOSCOPY W/ BIOPSIES AND POLYPECTOMY    .  DILATION AND CURETTAGE OF UTERUS    . hysterectomy (other)    . REVISON OF ARTERIOVENOUS FISTULA Left 04/27/2014   Procedure: REVISON OF LEFT RADIAL-CEPHALIC ARTERIOVENOUS FISTULA;  Surgeon: Mal Misty, MD;  Location: [facility];  Service: Vascular;  Laterality: Left;  . right knee surgery    . uvuloplasty      Current Medications: Current Meds  Medication Sig  . acetaminophen (TYLENOL ARTHRITIS PAIN) 650 MG CR tablet Take 650 mg by mouth every 8 (eight) hours as needed. For pain  . aspirin 81 MG tablet Take 81 mg by mouth  daily.  . Bisacodyl (CORRECTOL PO) Take 1-2 capsules by mouth daily as needed. For constipation  . calcitRIOL (ROCALTROL) 0.25 MCG capsule Take 1 capsule by mouth daily.   . cephALEXin (KEFLEX) 500 MG capsule Take 1 capsule (500 mg total) by mouth 2 (two) times daily.  . cloNIDine (CATAPRES) 0.2 MG tablet Take 0.2 mg by mouth 2 (two) times daily.  . furosemide (LASIX) 80 MG tablet Take 80 mg by mouth daily.   Marland Kitchen glimepiride (AMARYL) 2 MG tablet Take 2 mg by mouth daily. With 1st main meal of the day  . LORazepam (ATIVAN) 1 MG tablet Take 1-2 tablets (1-2 mg total) by mouth every 8 (eight) hours as needed for anxiety.  Marland Kitchen losartan (COZAAR) 100 MG tablet TAKE 1 TABLET DAILY  . MATZIM LA 180 MG 24 hr tablet Take 180 mg by mouth daily.   . Methylcellulose, Laxative, (FIBER THERAPY PO) Take 2 tablets by mouth daily.  Marland Kitchen oxyCODONE-acetaminophen (TYLOX) 5-500 MG per capsule Take 1 capsule by mouth every 4 (four) hours as needed for pain.  Marland Kitchen warfarin (COUMADIN) 5 MG tablet Take 5 mg by mouth daily. Pt states she takes 1/2 tablet on Tuesday and Thursday. Take 5 mg tablet on Monday, Wednesday, Friday, Saturday and Sunday.     Allergies:   Iodine; Other; Shellfish allergy; and Norvasc [amlodipine]   Social History   Social History  . Marital status: Single    Spouse name: N/A  . Number of children: N/A  . Years of education: N/A   Social History Main Topics  . Smoking status: Never Smoker  . Smokeless tobacco: Never Used  . Alcohol use No  . Drug use: No  . Sexual activity: Not Asked   Other Topics Concern  . None   Social History Narrative   Lives in [place] alone. 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     Family History  Problem Relation Age of Onset  . Lung cancer Father     died @ 71  . Hypertension Mother     alive @ 69  . Diabetes Brother   . Kidney disease Brother      ROS:   Please see the history of present illness.    Review of Systems    Cardiovascular: Positive for chest pain and dyspnea on exertion.  Respiratory: Positive for shortness of breath.   Musculoskeletal: Positive for joint swelling.   All other systems reviewed and are negative.   EKGs/Labs/Other Test Reviewed:    EKG:  EKG is  ordered today.  The ekg ordered today demonstrates NSR, HR 95, LAD, anterior Q waves, QTc 462 ms, no change from tracing dated 2015  Recent Labs: 01/24/2017: BUN 77; Creatinine, Ser 6.88; Hemoglobin 9.7; Platelets 267; Potassium 4.3; Sodium 141   Recent Lipid Panel No results found for: CHOL, TRIG, HDL, CHOLHDL, VLDL, LDLCALC, LDLDIRECT   Physical  Exam:    VS:  BP (!) 140/30   Pulse 95   Ht 5\' 7"  (1.702 m)   Wt (!) 302 lb 6.4 oz (137.2 kg)   BMI 47.36 kg/m     Wt Readings from Last 3 Encounters:  02/12/17 (!) 302 lb 6.4 oz (137.2 kg)  01/24/17 (!) 306 lb (138.8 kg)  04/18/15 (!) 307 lb 12.8 oz (139.6 kg)     Physical Exam  Constitutional: She is oriented to person, place, and time. She appears well-developed and well-nourished. No distress.  HENT:  Head: Normocephalic and atraumatic.  Eyes: No scleral icterus.  Neck:  I cannot evaluate JVD  Cardiovascular: Normal rate, regular rhythm and normal heart sounds.   No murmur heard. Pulmonary/Chest: Effort normal. She has no wheezes. She has no rales.  Abdominal: Soft. There is no tenderness.  Musculoskeletal: She exhibits edema.  Tight 1+ bilat LE edema.  Neurological: She is alert and oriented to person, place, and time.  Skin: Skin is warm and dry.  Psychiatric: She has a normal mood and affect.    ASSESSMENT:    1. Shortness of breath   2. Paroxysmal atrial fibrillation (HCC)   3. End stage renal disease ([place])   4. Essential hypertension   5. OSA (obstructive sleep apnea)   6. Morbid obesity ([place])    PLAN:    In order of problems listed above:  1. Shortness of breath -  She presents for further evaluation of dyspnea with minimal activity as well as  associated, vague chest pain. Her symptoms have improved since blood transfusion with PRBCs. I suspect her dyspnea is multifactorial and related to obesity, deconditioning, heart failure with preserved ejection fraction in the setting of end-stage renal disease, anemia. She is certainly at risk for coronary artery disease given her history of diabetes, hyperlipidemia, hypertension. She is starting dialysis at some point in the next 1-2 weeks. On exam today, she does exhibit some evidence of volume excess. This is managed by nephrology. She's currently on Lasix 80 mg daily.  -  Arrange echocardiogram  -  Arrange Lexiscan Myoview  -  If EF down or Myoview high risk, LHC can be considered after she starts dialysis  -  If testing unremarkable, continue close follow-up  2. Paroxysmal atrial fibrillation (HCC) -  Maintaining normal sinus rhythm. She remains on Coumadin. She does not have a history of stroke. If she has recurrent bleeding, she can hold Coumadin for short period of time.  3. End stage renal disease ([place]) -  She is due to start hemodialysis in the next 1-2 weeks.   4. Essential hypertension - BP with fair control.  5. OSA (obstructive sleep apnea) -  She is not adherent to CPAP. We discussed the importance of treating OSA.  Obtain echo to assess pulmonary pressures.   6. Morbid obesity ([place]) - We discussed the importance of weight loss.    Dispo:  Return in about 1 month (around 03/15/2017) for Follow up after testing with Dr. Willene Hatchet, PA-C.    Medication Adjustments/Labs and Tests Ordered: Current medicines are reviewed at length with the patient today.  Concerns regarding medicines are outlined above.  Medication changes, Labs and Tests ordered today are outlined in the Patient Instructions noted below. Patient Instructions  Medication Instructions:  Your physician recommends that you continue on your current medications as directed. Please refer to the Current  Medication list given to you today.  Labwork: NONE  Testing/Procedures: 1. Your physician  has requested that you have an echocardiogram. Echocardiography is a painless test that uses sound waves to create images of your heart. It provides your doctor with information about the size and shape of your heart and how well your heart's chambers and valves are working. This procedure takes approximately one hour. There are no restrictions for this procedure. 2. Your physician has requested that you have a lexiscan myoview. For further information please visit [email or web address]. Please follow instruction sheet, as given.  Follow-Up: DR. Radford Pax IN ABOUT 3 WEEKS AFTER ALL TEST HAVE BEEN COMPLETED  Any Other Special Instructions Will Be Listed Below (If Applicable).  If you need a refill on your cardiac medications before your next appointment, please call your pharmacy.  Signed, Richardson Dopp, PA-C  02/12/2017 4:51 PM    [facility] [address], [place], [place]  [address] Phone: [phone]; Fax: [phone]

## 2017-02-13 DIAGNOSIS — R509 Fever, unspecified: Secondary | ICD-10-CM | POA: Insufficient documentation

## 2017-02-13 DIAGNOSIS — D509 Iron deficiency anemia, unspecified: Secondary | ICD-10-CM | POA: Insufficient documentation

## 2017-02-13 DIAGNOSIS — R52 Pain, unspecified: Secondary | ICD-10-CM | POA: Insufficient documentation

## 2017-02-13 DIAGNOSIS — L299 Pruritus, unspecified: Secondary | ICD-10-CM | POA: Insufficient documentation

## 2017-02-13 DIAGNOSIS — N186 End stage renal disease: Secondary | ICD-10-CM | POA: Diagnosis not present

## 2017-02-13 DIAGNOSIS — D688 Other specified coagulation defects: Secondary | ICD-10-CM | POA: Insufficient documentation

## 2017-02-15 DIAGNOSIS — N186 End stage renal disease: Secondary | ICD-10-CM | POA: Diagnosis not present

## 2017-02-15 DIAGNOSIS — R52 Pain, unspecified: Secondary | ICD-10-CM | POA: Diagnosis not present

## 2017-02-17 DIAGNOSIS — D649 Anemia, unspecified: Secondary | ICD-10-CM | POA: Diagnosis not present

## 2017-02-17 DIAGNOSIS — N185 Chronic kidney disease, stage 5: Secondary | ICD-10-CM | POA: Diagnosis not present

## 2017-02-17 DIAGNOSIS — E1121 Type 2 diabetes mellitus with diabetic nephropathy: Secondary | ICD-10-CM | POA: Diagnosis not present

## 2017-02-17 DIAGNOSIS — Z5181 Encounter for therapeutic drug level monitoring: Secondary | ICD-10-CM | POA: Diagnosis not present

## 2017-02-17 DIAGNOSIS — Z992 Dependence on renal dialysis: Secondary | ICD-10-CM | POA: Diagnosis not present

## 2017-02-18 DIAGNOSIS — N2581 Secondary hyperparathyroidism of renal origin: Secondary | ICD-10-CM | POA: Diagnosis not present

## 2017-02-18 DIAGNOSIS — N186 End stage renal disease: Secondary | ICD-10-CM | POA: Diagnosis not present

## 2017-02-18 DIAGNOSIS — R51 Headache: Secondary | ICD-10-CM | POA: Diagnosis not present

## 2017-02-20 DIAGNOSIS — N2581 Secondary hyperparathyroidism of renal origin: Secondary | ICD-10-CM | POA: Diagnosis not present

## 2017-02-20 DIAGNOSIS — N186 End stage renal disease: Secondary | ICD-10-CM | POA: Diagnosis not present

## 2017-02-20 DIAGNOSIS — R51 Headache: Secondary | ICD-10-CM | POA: Diagnosis not present

## 2017-02-21 DIAGNOSIS — N2581 Secondary hyperparathyroidism of renal origin: Secondary | ICD-10-CM | POA: Insufficient documentation

## 2017-02-22 DIAGNOSIS — N186 End stage renal disease: Secondary | ICD-10-CM | POA: Diagnosis not present

## 2017-02-22 DIAGNOSIS — N2581 Secondary hyperparathyroidism of renal origin: Secondary | ICD-10-CM | POA: Diagnosis not present

## 2017-02-22 DIAGNOSIS — R51 Headache: Secondary | ICD-10-CM | POA: Diagnosis not present

## 2017-02-25 DIAGNOSIS — N2581 Secondary hyperparathyroidism of renal origin: Secondary | ICD-10-CM | POA: Diagnosis not present

## 2017-02-25 DIAGNOSIS — N186 End stage renal disease: Secondary | ICD-10-CM | POA: Diagnosis not present

## 2017-02-26 ENCOUNTER — Telehealth (HOSPITAL_COMMUNITY): Payer: Self-pay | Admitting: *Deleted

## 2017-02-26 NOTE — Telephone Encounter (Signed)
Left message on voicemail per DPR in reference to upcoming appointment scheduled on 03/03/17 with detailed instructions given per Myocardial Perfusion Study Information Sheet for the test. LM to arrive 15 minutes early, and that it is imperative to arrive on time for appointment to keep from having the test rescheduled. If you need to cancel or reschedule your appointment, please call the office within 24 hours of your appointment. Failure to do so may result in a cancellation of your appointment, and a $50 no show fee. Phone number given for call back for any questions. Kirstie Peri

## 2017-02-27 DIAGNOSIS — N186 End stage renal disease: Secondary | ICD-10-CM | POA: Diagnosis not present

## 2017-02-27 DIAGNOSIS — N2581 Secondary hyperparathyroidism of renal origin: Secondary | ICD-10-CM | POA: Diagnosis not present

## 2017-03-01 DIAGNOSIS — Z992 Dependence on renal dialysis: Secondary | ICD-10-CM | POA: Diagnosis not present

## 2017-03-01 DIAGNOSIS — N2581 Secondary hyperparathyroidism of renal origin: Secondary | ICD-10-CM | POA: Diagnosis not present

## 2017-03-01 DIAGNOSIS — E1122 Type 2 diabetes mellitus with diabetic chronic kidney disease: Secondary | ICD-10-CM | POA: Diagnosis not present

## 2017-03-01 DIAGNOSIS — N186 End stage renal disease: Secondary | ICD-10-CM | POA: Diagnosis not present

## 2017-03-03 ENCOUNTER — Encounter: Payer: Self-pay | Admitting: Physician Assistant

## 2017-03-03 ENCOUNTER — Ambulatory Visit (HOSPITAL_COMMUNITY): Payer: BLUE CROSS/BLUE SHIELD

## 2017-03-03 ENCOUNTER — Other Ambulatory Visit (HOSPITAL_COMMUNITY): Payer: BLUE CROSS/BLUE SHIELD

## 2017-03-04 ENCOUNTER — Ambulatory Visit (HOSPITAL_COMMUNITY): Payer: BLUE CROSS/BLUE SHIELD

## 2017-03-04 DIAGNOSIS — N2581 Secondary hyperparathyroidism of renal origin: Secondary | ICD-10-CM | POA: Diagnosis not present

## 2017-03-04 DIAGNOSIS — L299 Pruritus, unspecified: Secondary | ICD-10-CM | POA: Diagnosis not present

## 2017-03-04 DIAGNOSIS — N186 End stage renal disease: Secondary | ICD-10-CM | POA: Diagnosis not present

## 2017-03-06 DIAGNOSIS — N2581 Secondary hyperparathyroidism of renal origin: Secondary | ICD-10-CM | POA: Diagnosis not present

## 2017-03-06 DIAGNOSIS — N186 End stage renal disease: Secondary | ICD-10-CM | POA: Diagnosis not present

## 2017-03-06 DIAGNOSIS — L299 Pruritus, unspecified: Secondary | ICD-10-CM | POA: Diagnosis not present

## 2017-03-08 DIAGNOSIS — N186 End stage renal disease: Secondary | ICD-10-CM | POA: Diagnosis not present

## 2017-03-08 DIAGNOSIS — L299 Pruritus, unspecified: Secondary | ICD-10-CM | POA: Diagnosis not present

## 2017-03-08 DIAGNOSIS — N2581 Secondary hyperparathyroidism of renal origin: Secondary | ICD-10-CM | POA: Diagnosis not present

## 2017-03-11 DIAGNOSIS — Z23 Encounter for immunization: Secondary | ICD-10-CM | POA: Diagnosis not present

## 2017-03-11 DIAGNOSIS — N2581 Secondary hyperparathyroidism of renal origin: Secondary | ICD-10-CM | POA: Diagnosis not present

## 2017-03-11 DIAGNOSIS — N186 End stage renal disease: Secondary | ICD-10-CM | POA: Diagnosis not present

## 2017-03-13 DIAGNOSIS — N2581 Secondary hyperparathyroidism of renal origin: Secondary | ICD-10-CM | POA: Diagnosis not present

## 2017-03-13 DIAGNOSIS — Z23 Encounter for immunization: Secondary | ICD-10-CM | POA: Diagnosis not present

## 2017-03-13 DIAGNOSIS — N186 End stage renal disease: Secondary | ICD-10-CM | POA: Diagnosis not present

## 2017-03-15 DIAGNOSIS — N2581 Secondary hyperparathyroidism of renal origin: Secondary | ICD-10-CM | POA: Diagnosis not present

## 2017-03-15 DIAGNOSIS — N186 End stage renal disease: Secondary | ICD-10-CM | POA: Diagnosis not present

## 2017-03-15 DIAGNOSIS — Z23 Encounter for immunization: Secondary | ICD-10-CM | POA: Diagnosis not present

## 2017-03-18 ENCOUNTER — Ambulatory Visit: Payer: BLUE CROSS/BLUE SHIELD | Admitting: Physician Assistant

## 2017-03-18 DIAGNOSIS — N2581 Secondary hyperparathyroidism of renal origin: Secondary | ICD-10-CM | POA: Diagnosis not present

## 2017-03-18 DIAGNOSIS — N186 End stage renal disease: Secondary | ICD-10-CM | POA: Diagnosis not present

## 2017-03-20 ENCOUNTER — Telehealth: Payer: Self-pay | Admitting: *Deleted

## 2017-03-20 DIAGNOSIS — N186 End stage renal disease: Secondary | ICD-10-CM | POA: Diagnosis not present

## 2017-03-20 DIAGNOSIS — N2581 Secondary hyperparathyroidism of renal origin: Secondary | ICD-10-CM | POA: Diagnosis not present

## 2017-03-20 NOTE — Telephone Encounter (Signed)
A message was left for Brenda Arnold to call and reschedule her test.

## 2017-03-21 DIAGNOSIS — T798XXA Other early complications of trauma, initial encounter: Secondary | ICD-10-CM | POA: Diagnosis not present

## 2017-03-21 DIAGNOSIS — Z7901 Long term (current) use of anticoagulants: Secondary | ICD-10-CM | POA: Diagnosis not present

## 2017-03-21 DIAGNOSIS — I482 Chronic atrial fibrillation: Secondary | ICD-10-CM | POA: Diagnosis not present

## 2017-03-22 DIAGNOSIS — N186 End stage renal disease: Secondary | ICD-10-CM | POA: Diagnosis not present

## 2017-03-22 DIAGNOSIS — N2581 Secondary hyperparathyroidism of renal origin: Secondary | ICD-10-CM | POA: Diagnosis not present

## 2017-03-24 ENCOUNTER — Other Ambulatory Visit: Payer: Self-pay | Admitting: Physician Assistant

## 2017-03-24 ENCOUNTER — Ambulatory Visit
Admission: RE | Admit: 2017-03-24 | Discharge: 2017-03-24 | Disposition: A | Discharge status: Home / Self Care | Payer: BLUE CROSS/BLUE SHIELD | Source: Ambulatory Visit | Attending: Physician Assistant | Admitting: Physician Assistant

## 2017-03-24 DIAGNOSIS — M79605 Pain in left leg: Secondary | ICD-10-CM

## 2017-03-24 DIAGNOSIS — R600 Localized edema: Secondary | ICD-10-CM | POA: Diagnosis not present

## 2017-03-24 DIAGNOSIS — R609 Edema, unspecified: Secondary | ICD-10-CM

## 2017-03-24 DIAGNOSIS — M79606 Pain in leg, unspecified: Secondary | ICD-10-CM | POA: Diagnosis not present

## 2017-03-24 DIAGNOSIS — I482 Chronic atrial fibrillation: Secondary | ICD-10-CM | POA: Diagnosis not present

## 2017-03-24 DIAGNOSIS — Z5181 Encounter for therapeutic drug level monitoring: Secondary | ICD-10-CM | POA: Diagnosis not present

## 2017-03-24 DIAGNOSIS — L0390 Cellulitis, unspecified: Secondary | ICD-10-CM | POA: Diagnosis not present

## 2017-03-25 DIAGNOSIS — N2581 Secondary hyperparathyroidism of renal origin: Secondary | ICD-10-CM | POA: Diagnosis not present

## 2017-03-25 DIAGNOSIS — L299 Pruritus, unspecified: Secondary | ICD-10-CM | POA: Diagnosis not present

## 2017-03-25 DIAGNOSIS — N186 End stage renal disease: Secondary | ICD-10-CM | POA: Diagnosis not present

## 2017-03-27 DIAGNOSIS — N2581 Secondary hyperparathyroidism of renal origin: Secondary | ICD-10-CM | POA: Diagnosis not present

## 2017-03-27 DIAGNOSIS — N186 End stage renal disease: Secondary | ICD-10-CM | POA: Diagnosis not present

## 2017-03-27 DIAGNOSIS — L299 Pruritus, unspecified: Secondary | ICD-10-CM | POA: Diagnosis not present

## 2017-03-28 ENCOUNTER — Other Ambulatory Visit: Payer: Self-pay | Admitting: Family Medicine

## 2017-03-28 DIAGNOSIS — L0390 Cellulitis, unspecified: Secondary | ICD-10-CM

## 2017-03-28 DIAGNOSIS — E11319 Type 2 diabetes mellitus with unspecified diabetic retinopathy without macular edema: Secondary | ICD-10-CM | POA: Diagnosis not present

## 2017-03-28 DIAGNOSIS — Z992 Dependence on renal dialysis: Secondary | ICD-10-CM | POA: Diagnosis not present

## 2017-03-28 DIAGNOSIS — N185 Chronic kidney disease, stage 5: Secondary | ICD-10-CM | POA: Diagnosis not present

## 2017-03-29 DIAGNOSIS — N186 End stage renal disease: Secondary | ICD-10-CM | POA: Diagnosis not present

## 2017-03-29 DIAGNOSIS — L299 Pruritus, unspecified: Secondary | ICD-10-CM | POA: Diagnosis not present

## 2017-03-29 DIAGNOSIS — N2581 Secondary hyperparathyroidism of renal origin: Secondary | ICD-10-CM | POA: Diagnosis not present

## 2017-03-31 DIAGNOSIS — N186 End stage renal disease: Secondary | ICD-10-CM | POA: Diagnosis not present

## 2017-03-31 DIAGNOSIS — E1122 Type 2 diabetes mellitus with diabetic chronic kidney disease: Secondary | ICD-10-CM | POA: Diagnosis not present

## 2017-03-31 DIAGNOSIS — Z992 Dependence on renal dialysis: Secondary | ICD-10-CM | POA: Diagnosis not present

## 2017-04-01 DIAGNOSIS — N186 End stage renal disease: Secondary | ICD-10-CM | POA: Diagnosis not present

## 2017-04-01 DIAGNOSIS — N2581 Secondary hyperparathyroidism of renal origin: Secondary | ICD-10-CM | POA: Diagnosis not present

## 2017-04-03 ENCOUNTER — Ambulatory Visit: Payer: BLUE CROSS/BLUE SHIELD | Admitting: Podiatry

## 2017-04-03 DIAGNOSIS — N186 End stage renal disease: Secondary | ICD-10-CM | POA: Diagnosis not present

## 2017-04-03 DIAGNOSIS — N2581 Secondary hyperparathyroidism of renal origin: Secondary | ICD-10-CM | POA: Diagnosis not present

## 2017-04-05 DIAGNOSIS — N2581 Secondary hyperparathyroidism of renal origin: Secondary | ICD-10-CM | POA: Diagnosis not present

## 2017-04-05 DIAGNOSIS — N186 End stage renal disease: Secondary | ICD-10-CM | POA: Diagnosis not present

## 2017-04-08 DIAGNOSIS — N2581 Secondary hyperparathyroidism of renal origin: Secondary | ICD-10-CM | POA: Diagnosis not present

## 2017-04-08 DIAGNOSIS — Z23 Encounter for immunization: Secondary | ICD-10-CM | POA: Diagnosis not present

## 2017-04-08 DIAGNOSIS — N186 End stage renal disease: Secondary | ICD-10-CM | POA: Diagnosis not present

## 2017-04-08 DIAGNOSIS — R52 Pain, unspecified: Secondary | ICD-10-CM | POA: Diagnosis not present

## 2017-04-09 ENCOUNTER — Inpatient Hospital Stay
Admission: RE | Admit: 2017-04-09 | Discharge: 2017-04-09 | Disposition: A | Discharge status: Home / Self Care | Payer: BLUE CROSS/BLUE SHIELD | Source: Ambulatory Visit | Attending: Family Medicine | Admitting: Family Medicine

## 2017-04-09 ENCOUNTER — Ambulatory Visit: Payer: BLUE CROSS/BLUE SHIELD | Admitting: Podiatry

## 2017-04-09 ENCOUNTER — Ambulatory Visit
Admission: RE | Admit: 2017-04-09 | Discharge: 2017-04-09 | Disposition: A | Discharge status: Home / Self Care | Payer: BLUE CROSS/BLUE SHIELD | Source: Ambulatory Visit | Attending: Family Medicine | Admitting: Family Medicine

## 2017-04-09 DIAGNOSIS — L97921 Non-pressure chronic ulcer of unspecified part of left lower leg limited to breakdown of skin: Secondary | ICD-10-CM | POA: Diagnosis not present

## 2017-04-09 DIAGNOSIS — L0390 Cellulitis, unspecified: Secondary | ICD-10-CM

## 2017-04-10 DIAGNOSIS — R52 Pain, unspecified: Secondary | ICD-10-CM | POA: Diagnosis not present

## 2017-04-10 DIAGNOSIS — N2581 Secondary hyperparathyroidism of renal origin: Secondary | ICD-10-CM | POA: Diagnosis not present

## 2017-04-10 DIAGNOSIS — N186 End stage renal disease: Secondary | ICD-10-CM | POA: Diagnosis not present

## 2017-04-10 DIAGNOSIS — Z23 Encounter for immunization: Secondary | ICD-10-CM | POA: Diagnosis not present

## 2017-04-12 DIAGNOSIS — R52 Pain, unspecified: Secondary | ICD-10-CM | POA: Diagnosis not present

## 2017-04-12 DIAGNOSIS — N186 End stage renal disease: Secondary | ICD-10-CM | POA: Diagnosis not present

## 2017-04-12 DIAGNOSIS — Z23 Encounter for immunization: Secondary | ICD-10-CM | POA: Diagnosis not present

## 2017-04-12 DIAGNOSIS — N2581 Secondary hyperparathyroidism of renal origin: Secondary | ICD-10-CM | POA: Diagnosis not present

## 2017-04-14 ENCOUNTER — Telehealth (HOSPITAL_COMMUNITY): Payer: Self-pay | Admitting: *Deleted

## 2017-04-14 NOTE — Telephone Encounter (Signed)
Patient given detailed instructions per Myocardial Perfusion Study Information Sheet for the test on 04/16/17 at 1245. Patient notified to arrive 15 minutes early and that it is imperative to arrive on time for appointment to keep from having the test rescheduled.  If you need to cancel or reschedule your appointment, please call the office within 24 hours of your appointment. Failure to do so may result in a cancellation of your appointment, and a $50 no show fee. Patient verbalized understanding.Kayline Sheer, Ranae Palms

## 2017-04-15 DIAGNOSIS — R52 Pain, unspecified: Secondary | ICD-10-CM | POA: Diagnosis not present

## 2017-04-15 DIAGNOSIS — N186 End stage renal disease: Secondary | ICD-10-CM | POA: Diagnosis not present

## 2017-04-15 DIAGNOSIS — Z23 Encounter for immunization: Secondary | ICD-10-CM | POA: Diagnosis not present

## 2017-04-15 DIAGNOSIS — N2581 Secondary hyperparathyroidism of renal origin: Secondary | ICD-10-CM | POA: Diagnosis not present

## 2017-04-16 ENCOUNTER — Ambulatory Visit (INDEPENDENT_AMBULATORY_CARE_PROVIDER_SITE_OTHER): Payer: BLUE CROSS/BLUE SHIELD | Admitting: Podiatry

## 2017-04-16 ENCOUNTER — Ambulatory Visit (HOSPITAL_COMMUNITY): Payer: BLUE CROSS/BLUE SHIELD | Attending: Cardiovascular Disease

## 2017-04-16 ENCOUNTER — Encounter: Payer: Self-pay | Admitting: Podiatry

## 2017-04-16 DIAGNOSIS — R0609 Other forms of dyspnea: Secondary | ICD-10-CM | POA: Insufficient documentation

## 2017-04-16 DIAGNOSIS — I480 Paroxysmal atrial fibrillation: Secondary | ICD-10-CM

## 2017-04-16 DIAGNOSIS — I4891 Unspecified atrial fibrillation: Secondary | ICD-10-CM | POA: Insufficient documentation

## 2017-04-16 DIAGNOSIS — M79671 Pain in right foot: Secondary | ICD-10-CM

## 2017-04-16 DIAGNOSIS — R0602 Shortness of breath: Secondary | ICD-10-CM | POA: Diagnosis not present

## 2017-04-16 DIAGNOSIS — L600 Ingrowing nail: Secondary | ICD-10-CM

## 2017-04-16 DIAGNOSIS — B351 Tinea unguium: Secondary | ICD-10-CM | POA: Diagnosis not present

## 2017-04-16 DIAGNOSIS — M79672 Pain in left foot: Secondary | ICD-10-CM

## 2017-04-16 DIAGNOSIS — R079 Chest pain, unspecified: Secondary | ICD-10-CM | POA: Insufficient documentation

## 2017-04-16 DIAGNOSIS — I2510 Atherosclerotic heart disease of native coronary artery without angina pectoris: Secondary | ICD-10-CM | POA: Diagnosis present

## 2017-04-16 DIAGNOSIS — R110 Nausea: Secondary | ICD-10-CM

## 2017-04-16 DIAGNOSIS — I10 Essential (primary) hypertension: Secondary | ICD-10-CM | POA: Diagnosis not present

## 2017-04-16 MED ORDER — AMINOPHYLLINE 25 MG/ML IV SOLN
75.0000 mg | Freq: Once | INTRAVENOUS | Status: AC
  Administered 2017-04-16: 75 mg via INTRAVENOUS

## 2017-04-16 MED ORDER — REGADENOSON 0.4 MG/5ML IV SOLN
0.4000 mg | Freq: Once | INTRAVENOUS | Status: AC
  Administered 2017-04-16: 0.4 mg via INTRAVENOUS

## 2017-04-16 MED ORDER — TECHNETIUM TC 99M TETROFOSMIN IV KIT
32.6000 | PACK | Freq: Once | INTRAVENOUS | Status: AC | PRN
  Administered 2017-04-16: 32.6 via INTRAVENOUS
  Filled 2017-04-16: qty 33

## 2017-04-16 NOTE — Patient Instructions (Signed)
Seen for hypertrophic nails. All nails debrided. Return in 3 months or as needed.  

## 2017-04-16 NOTE — Progress Notes (Signed)
Subjective:  61year old NIDDM female presents requesting toe nails trimmed. Using walker for the past 2 months. Has had episode of serious nose bleeding and was hospitalized. She is also getting Dialysis now and was taken off of Warfarin and diabetic medication.  She is having on going bad leg pain from a skin lesion. She has an appointment with wound care center.   Objective:  Thick and hypertrophic nails with fungal debris x 10.  Dry hyperpigmented leathery skin both lower limbs. No edema or erythema noted. Pedal pulses are all palpable.  Nails are all thick and great toe nails are very deformed.  No open skin lesions. No edema or erythema noted.  No gross deformities in osseous structures.  Assessment: Onychomycosis and deformed nails x 10.  Diabetic under control.  Xerotic skin both lower limbs without open skin.  Severe arthropathy lower limbs. Difficulty walking.   Plan:  All nails debrided.  Vitamin A cream applied to both lower limbs. Continue daily.  Return as needed.

## 2017-04-17 ENCOUNTER — Encounter: Payer: Self-pay | Admitting: Physician Assistant

## 2017-04-17 ENCOUNTER — Ambulatory Visit (HOSPITAL_COMMUNITY): Payer: BLUE CROSS/BLUE SHIELD | Attending: Cardiovascular Disease

## 2017-04-17 DIAGNOSIS — N186 End stage renal disease: Secondary | ICD-10-CM | POA: Diagnosis not present

## 2017-04-17 DIAGNOSIS — R52 Pain, unspecified: Secondary | ICD-10-CM | POA: Diagnosis not present

## 2017-04-17 DIAGNOSIS — N2581 Secondary hyperparathyroidism of renal origin: Secondary | ICD-10-CM | POA: Diagnosis not present

## 2017-04-17 DIAGNOSIS — Z23 Encounter for immunization: Secondary | ICD-10-CM | POA: Diagnosis not present

## 2017-04-17 LAB — MYOCARDIAL PERFUSION IMAGING
LV dias vol: 110 mL (ref 46–106)
LV sys vol: 35 mL
Peak HR: 94 {beats}/min
RATE: 0.26
Rest HR: 75 {beats}/min
SDS: 3
SRS: 2
SSS: 5
TID: 1.21

## 2017-04-17 MED ORDER — TECHNETIUM TC 99M TETROFOSMIN IV KIT
32.1000 | PACK | Freq: Once | INTRAVENOUS | Status: AC | PRN
  Administered 2017-04-17: 32.1 via INTRAVENOUS
  Filled 2017-04-17: qty 33

## 2017-04-19 ENCOUNTER — Emergency Department (HOSPITAL_COMMUNITY)
Admission: EM | Admit: 2017-04-19 | Discharge: 2017-04-20 | Disposition: A | Discharge status: Home / Self Care | Payer: BLUE CROSS/BLUE SHIELD | Attending: Emergency Medicine | Admitting: Emergency Medicine

## 2017-04-19 ENCOUNTER — Encounter (HOSPITAL_COMMUNITY): Payer: Self-pay

## 2017-04-19 DIAGNOSIS — Y939 Activity, unspecified: Secondary | ICD-10-CM | POA: Diagnosis not present

## 2017-04-19 DIAGNOSIS — Y841 Kidney dialysis as the cause of abnormal reaction of the patient, or of later complication, without mention of misadventure at the time of the procedure: Secondary | ICD-10-CM | POA: Diagnosis not present

## 2017-04-19 DIAGNOSIS — Z23 Encounter for immunization: Secondary | ICD-10-CM | POA: Diagnosis not present

## 2017-04-19 DIAGNOSIS — M79605 Pain in left leg: Secondary | ICD-10-CM

## 2017-04-19 DIAGNOSIS — Z79899 Other long term (current) drug therapy: Secondary | ICD-10-CM | POA: Diagnosis not present

## 2017-04-19 DIAGNOSIS — Y999 Unspecified external cause status: Secondary | ICD-10-CM | POA: Diagnosis not present

## 2017-04-19 DIAGNOSIS — I120 Hypertensive chronic kidney disease with stage 5 chronic kidney disease or end stage renal disease: Secondary | ICD-10-CM | POA: Insufficient documentation

## 2017-04-19 DIAGNOSIS — M79604 Pain in right leg: Secondary | ICD-10-CM | POA: Insufficient documentation

## 2017-04-19 DIAGNOSIS — S81802A Unspecified open wound, left lower leg, initial encounter: Secondary | ICD-10-CM | POA: Diagnosis not present

## 2017-04-19 DIAGNOSIS — N186 End stage renal disease: Secondary | ICD-10-CM | POA: Diagnosis not present

## 2017-04-19 DIAGNOSIS — Z992 Dependence on renal dialysis: Secondary | ICD-10-CM | POA: Diagnosis not present

## 2017-04-19 DIAGNOSIS — J45909 Unspecified asthma, uncomplicated: Secondary | ICD-10-CM | POA: Insufficient documentation

## 2017-04-19 DIAGNOSIS — N2581 Secondary hyperparathyroidism of renal origin: Secondary | ICD-10-CM | POA: Diagnosis not present

## 2017-04-19 DIAGNOSIS — R600 Localized edema: Secondary | ICD-10-CM | POA: Diagnosis not present

## 2017-04-19 DIAGNOSIS — R52 Pain, unspecified: Secondary | ICD-10-CM | POA: Diagnosis not present

## 2017-04-19 DIAGNOSIS — E1122 Type 2 diabetes mellitus with diabetic chronic kidney disease: Secondary | ICD-10-CM | POA: Diagnosis not present

## 2017-04-19 DIAGNOSIS — Y929 Unspecified place or not applicable: Secondary | ICD-10-CM | POA: Diagnosis not present

## 2017-04-19 LAB — COMPREHENSIVE METABOLIC PANEL WITH GFR
Alkaline Phosphatase: 87 U/L (ref 38–126)
Anion gap: 11 (ref 5–15)
CO2: 30 mmol/L (ref 22–32)
Calcium: 8.5 mg/dL — ABNORMAL LOW (ref 8.9–10.3)
Chloride: 95 mmol/L — ABNORMAL LOW (ref 101–111)
GFR calc Af Amer: 11 mL/min — ABNORMAL LOW (ref >60)
GFR calc non Af Amer: 9 mL/min — ABNORMAL LOW (ref >60)
Sodium: 136 mmol/L (ref 135–145)

## 2017-04-19 LAB — CBC WITH DIFFERENTIAL/PLATELET
Basophils Absolute: 0 10*3/uL (ref 0.0–0.1)
Basophils Relative: 0 %
Eosinophils Absolute: 0.2 10*3/uL (ref 0.0–0.7)
Eosinophils Relative: 2 %
HCT: 34.4 % — ABNORMAL LOW (ref 36.0–46.0)
Hemoglobin: 10.8 g/dL — ABNORMAL LOW (ref 12.0–15.0)
Lymphocytes Relative: 12 %
Lymphs Abs: 1.2 10*3/uL (ref 0.7–4.0)
MCH: 31.2 pg (ref 26.0–34.0)
MCHC: 31.4 g/dL (ref 30.0–36.0)
MCV: 99.4 fL (ref 78.0–100.0)
Monocytes Absolute: 1 10*3/uL (ref 0.1–1.0)
Monocytes Relative: 10 %
Neutro Abs: 7.6 10*3/uL (ref 1.7–7.7)
Neutrophils Relative %: 76 %
Platelets: 153 10*3/uL (ref 150–400)
RBC: 3.46 MIL/uL — ABNORMAL LOW (ref 3.87–5.11)
RDW: 16.3 % — ABNORMAL HIGH (ref 11.5–15.5)
WBC: 10.1 10*3/uL (ref 4.0–10.5)

## 2017-04-19 LAB — COMPREHENSIVE METABOLIC PANEL
ALT: 32 U/L (ref 14–54)
AST: 30 U/L (ref 15–41)
Albumin: 3.3 g/dL — ABNORMAL LOW (ref 3.5–5.0)
BUN: 9 mg/dL (ref 6–20)
Creatinine, Ser: 4.74 mg/dL — ABNORMAL HIGH (ref 0.44–1.00)
Glucose, Bld: 100 mg/dL — ABNORMAL HIGH (ref 65–99)
Potassium: 3.3 mmol/L — ABNORMAL LOW (ref 3.5–5.1)
Total Bilirubin: 0.4 mg/dL (ref 0.3–1.2)
Total Protein: 8.2 g/dL — ABNORMAL HIGH (ref 6.5–8.1)

## 2017-04-19 LAB — I-STAT CG4 LACTIC ACID, ED: Lactic Acid, Venous: 1.65 mmol/L (ref 0.5–1.9)

## 2017-04-19 NOTE — ED Triage Notes (Signed)
Pt here for leg wound to left lower leg sts skin is sloughing off since dialysis in march.  and now the right leg has started doing the same. sts started as a small sore and progressed.

## 2017-04-20 ENCOUNTER — Emergency Department (HOSPITAL_COMMUNITY): Payer: BLUE CROSS/BLUE SHIELD

## 2017-04-20 DIAGNOSIS — R600 Localized edema: Secondary | ICD-10-CM | POA: Diagnosis not present

## 2017-04-20 LAB — I-STAT CG4 LACTIC ACID, ED: Lactic Acid, Venous: 1.32 mmol/L (ref 0.5–1.9)

## 2017-04-20 MED ORDER — HYDROCODONE-ACETAMINOPHEN 5-325 MG PO TABS
2.0000 | ORAL_TABLET | Freq: Once | ORAL | Status: AC
  Administered 2017-04-20: 2 via ORAL
  Filled 2017-04-20: qty 2

## 2017-04-20 MED ORDER — FENTANYL CITRATE (PF) 100 MCG/2ML IJ SOLN
100.0000 ug | Freq: Once | INTRAMUSCULAR | Status: AC
  Administered 2017-04-20: 100 ug via INTRAVENOUS
  Filled 2017-04-20: qty 2

## 2017-04-20 MED ORDER — DOXYCYCLINE HYCLATE 100 MG PO CAPS: 100.0000 mg | ORAL_CAPSULE | Freq: Two times a day (BID) | ORAL | 0 refills | Status: DC

## 2017-04-20 MED ORDER — DOXYCYCLINE HYCLATE 100 MG PO TABS
100.0000 mg | ORAL_TABLET | Freq: Once | ORAL | Status: AC
  Administered 2017-04-20: 100 mg via ORAL
  Filled 2017-04-20: qty 1

## 2017-04-20 NOTE — ED Notes (Signed)
Family at bedside. 

## 2017-04-20 NOTE — ED Provider Notes (Addendum)
[facility] DEPT Provider Note   CSN: [id number] Arrival date & time: 04/19/17  1952  By signing my name below, I, Brenda Arnold, attest that this documentation has been prepared under the direction and in the presence of Sherwood Gambler, MD . Electronically Signed: Dolores Arnold, Scribe. 04/20/2017. 12:02 AM.  History   Chief Complaint Chief Complaint  Patient presents with  . Leg Injury   The history is provided by the patient. No language interpreter was used.    HPI Comments:  Brenda Arnold is a 61 y.o. female with pmhx of DM, HTN and CKD on a T/TH/Sat dialysis schedule who presents to the Emergency Department with constant, moderate bilateral lower leg pain onset >2 months. She describes the pain as sharp, lasting 3-5 seconds, and worse in her left leg. Pt reports associated worsening wound to her left leg. She states that her wound began and has been worsening since she started dialysis in March 2018. She has tried hydrocodone and gabapentin with no relief.  Denies any weakness or numbness. Pt was seen by one of her doctors with similar and was given a doppler which resulted normally. She notes that she has been on antibiotics recently (~1 month ago), Keflex and another abx which she does not remember.    Past Medical History:  Diagnosis Date  . Anemia   . Arthritis   . Asthma   . CKD (chronic kidney disease), stage IV ([place])   . Complication of anesthesia    difficulty with getting oxygen saturation up  . Diabetes mellitus   . Dyslipidemia   . Epistaxis 11/05/2012  . History of nuclear stress test    Myoview 5/18: EF 68, no infarct or ischemia, low risk  . HTN (hypertension)   . Hyperlipidemia   . Hyperparathyroidism due to renal insufficiency ([place])   . Obesity    s/p panniculectomy  . Paroxysmal A-fib (HCC)    a. chronic coumadin;  b. 12/2009 Echo: EF 60-65%, Gr 1 DD.  Marland Kitchen PCOS (polycystic ovarian syndrome)   . Pneumonia   . Seasonal allergies   . Sleep apnea    a. not using CPAP, last study  >8 yrs  . Vitamin D deficiency     Patient Active Problem List   Diagnosis Date Noted  . Anticoagulation goal of INR 2 to 3 09/30/2014  . Pain in lower limb 05/02/2014  . Onychomycosis due to dermatophyte 04/06/2013  . Pain in joint, ankle and foot 04/06/2013  . Bradycardia 11/06/2012  . Left-sided epistaxis 11/05/2012  . DM (diabetes mellitus) ([place]) 11/05/2012  . OSA (obstructive sleep apnea) 11/05/2012  . Acute posterior epistaxis 11/05/2012  . CKD (chronic kidney disease) 11/05/2012  . End stage renal disease ([place]) 06/25/2012  . DM 12/20/2009  . OBESITY 12/20/2009  . OBESITY-MORBID (>100') 12/20/2009  . Essential hypertension 12/20/2009  . Paroxysmal atrial fibrillation ([place]) 12/20/2009  . CHEST PAIN-UNSPECIFIED 12/20/2009  . ABNORMAL CV (STRESS) TEST 12/20/2009    Past Surgical History:  Procedure Laterality Date  . ABDOMINAL HYSTERECTOMY     with panniculctomy  . AV FISTULA PLACEMENT  09/02/2012   Procedure: ARTERIOVENOUS (AV) FISTULA CREATION;  Surgeon: Elam Dutch, MD;  Location: [facility] OR;  Service: Vascular;  Laterality: Left;  Creation of Left Radial-Cephalic Fistula   . BREAST SURGERY     Biopsy right breast  . COLONOSCOPY W/ BIOPSIES AND POLYPECTOMY    . DILATION AND CURETTAGE OF UTERUS    . hysterectomy (other)    .  REVISON OF ARTERIOVENOUS FISTULA Left 04/27/2014   Procedure: REVISON OF LEFT RADIAL-CEPHALIC ARTERIOVENOUS FISTULA;  Surgeon: Mal Misty, MD;  Location: [facility];  Service: Vascular;  Laterality: Left;  . right knee surgery    . uvuloplasty      OB History    No data available       Home Medications    Prior to Admission medications   Medication Sig Start Date End Date Taking? Authorizing Provider  MATZIM LA 180 MG 24 hr tablet Take 180 mg by mouth daily.  05/04/14  Yes [provider]  cephALEXin (KEFLEX) 500 MG capsule Take 1 capsule (500 mg total) by mouth 2 (two) times daily. Patient not taking:  Reported on 04/20/2017 01/24/17   Ward, Delice Bison, DO  doxycycline (VIBRAMYCIN) 100 MG capsule Take 1 capsule (100 mg total) by mouth 2 (two) times daily. 04/20/17   Sherwood Gambler, MD  LORazepam (ATIVAN) 1 MG tablet Take 1-2 tablets (1-2 mg total) by mouth every 8 (eight) hours as needed for anxiety. Patient not taking: Reported on 04/20/2017 01/24/17   Ward, Delice Bison, DO  losartan (COZAAR) 100 MG tablet TAKE 1 TABLET DAILY Patient not taking: Reported on 04/20/2017 06/16/13   Wall, Marijo Conception, MD  oxyCODONE-acetaminophen (TYLOX) 5-500 MG per capsule Take 1 capsule by mouth every 4 (four) hours as needed for pain. Patient not taking: Reported on 04/20/2017 04/27/14   Mal Misty, MD    Family History Family History  Problem Relation Age of Onset  . Lung cancer Father        died @ 15  . Hypertension Mother        alive @ 69  . Diabetes Brother   . Kidney disease Brother     Social History Social History  Substance Use Topics  . Smoking status: Never Smoker  . Smokeless tobacco: Never Used  . Alcohol use No     Allergies   Iodine; Other; Shellfish allergy; and Norvasc [amlodipine]   Review of Systems Review of Systems  Musculoskeletal: Positive for myalgias.  Skin: Positive for wound.  Neurological: Negative for weakness and numbness.  All other systems reviewed and are negative.    Physical Exam Updated Vital Signs BP (!) 141/49   Pulse 94   Temp 99.4 F (37.4 C) (Oral)   Resp 20   Ht 5\' 7"  (1.702 m)   Wt 278 lb 3 oz (126.2 kg)   SpO2 98%   BMI 43.57 kg/m   Physical Exam  Constitutional: She is oriented to person, place, and time. She appears well-developed and well-nourished.  Morbidly obese.   HENT:  Head: Normocephalic and atraumatic.  Right Ear: External ear normal.  Left Ear: External ear normal.  Nose: Nose normal.  Eyes: Right eye exhibits no discharge. Left eye exhibits no discharge.  Cardiovascular: Normal rate and regular rhythm.   2+ DP pulses  bilaterally.   Pulmonary/Chest: Effort normal.  Abdominal: Soft. There is no tenderness.  Musculoskeletal:  Symmetric non-pitting lower extremities with diffuse nonfocal tenderness. See picture for LLE wound. Feet are warm, well profused, normal strength and sensation.   Neurological: She is alert and oriented to person, place, and time.  Skin: Skin is warm and dry.  Nursing note and vitals reviewed.     ED Treatments / Results  DIAGNOSTIC STUDIES:   COORDINATION OF CARE:  12:26 AM Discussed treatment plan with pt at bedside which includes XR and pain medication and pt agreed to plan.  Labs (all labs ordered are listed, but only abnormal results are displayed) Labs Reviewed  COMPREHENSIVE METABOLIC PANEL - Abnormal; Notable for the following:       Result Value   Potassium 3.3 (*)    Chloride 95 (*)    Glucose, Bld 100 (*)    Creatinine, Ser 4.74 (*)    Calcium 8.5 (*)    Total Protein 8.2 (*)    Albumin 3.3 (*)    GFR calc non Af Amer 9 (*)    GFR calc Af Amer 11 (*)    All other components within normal limits  CBC WITH DIFFERENTIAL/PLATELET - Abnormal; Notable for the following:    RBC 3.46 (*)    Hemoglobin 10.8 (*)    HCT 34.4 (*)    RDW 16.3 (*)    All other components within normal limits  I-STAT CG4 LACTIC ACID, ED  I-STAT CG4 LACTIC ACID, ED    EKG  EKG Interpretation None       Radiology Dg Tibia/fibula Left  Result Date: 04/20/2017 CLINICAL DATA:  Left lower leg wounds. EXAM: LEFT TIBIA AND FIBULA - 2 VIEW COMPARISON:  None. FINDINGS: Diffuse soft tissue edema of the lower extremity. Skin irregularity noted about the distal lateral lower leg. No radiopaque foreign body or soft tissue air. Fracture, periosteal reaction or bony destructive change. Atherosclerotic calcifications of lower extremity vasculature. IMPRESSION: Soft tissue edema and skin irregularity about the distal lateral lower leg. No foreign body, soft tissue air or acute osseous  abnormality. Electronically Signed   By: Jeb Levering M.D.   On: 04/20/2017 01:25    Procedures Procedures (including critical care time)  Medications Ordered in ED Medications  fentaNYL (SUBLIMAZE) injection 100 mcg (100 mcg Intravenous Given 04/20/17 0059)  HYDROcodone-acetaminophen (NORCO/VICODIN) 5-325 MG per tablet 2 tablet (2 tablets Oral Given 04/20/17 0241)  doxycycline (VIBRA-TABS) tablet 100 mg (100 mg Oral Given 04/20/17 0241)     Initial Impression / Assessment and Plan / ED Course  I have reviewed the triage vital signs and the nursing notes.  Pertinent labs & imaging results that were available during my care of the patient were reviewed by me and considered in my medical decision making (see chart for details).    Patient's bilateral leg pain is probably neuropathic in origin given her history of chronic kidney disease on dialysis and diabetes. The slowly worsening wound could be infectious, we'll cover with doxycycline. However it also could just be poorly healing and worsening due to venous insufficiency. She has strong bilateral pulses and warm extremities, this is not consistent with significant peripheral vascular disease. Discussed increasing her hydrocodone at home as she is only on 5 mg as well as increasing her Neurontin from 100 to 200 mg nightly. Follow-up closely with PCP for further medication titration. She has wound care follow-up next week.   Final Clinical Impressions(s) / ED Diagnoses   Final diagnoses:  Bilateral leg pain  Wound of left lower extremity, initial encounter    New Prescriptions New Prescriptions   DOXYCYCLINE (VIBRAMYCIN) 100 MG CAPSULE    Take 1 capsule (100 mg total) by mouth 2 (two) times daily.   I personally performed the services described in this documentation, which was scribed in my presence. The recorded information has been reviewed and is accurate.     Sherwood Gambler, MD 04/20/17 Danelle Berry    Sherwood Gambler,  MD 04/20/17 [phone]

## 2017-04-20 NOTE — ED Notes (Signed)
Patient is alert and orientedx4.  Patient was explained discharge instructions and they understood them with no questions.  The patient's mother, Vallory Oetken is taking the patient home.

## 2017-04-21 ENCOUNTER — Telehealth: Payer: Self-pay | Admitting: *Deleted

## 2017-04-21 NOTE — Telephone Encounter (Signed)
-----   Message from Nuala Alpha, LPN sent at 9/44/7395  3:01 PM EDT -----   ----- Message ----- From: Liliane Shi, PA-C Sent: 04/17/2017   4:28 PM To: [address] Triage  Please call the patient. The stress test is normal. Continue current management.  Please route a copy of this study result to her PCP:  Kelton Pillar, MD  Richardson Dopp, PA-C    04/17/2017 4:27 PM

## 2017-04-21 NOTE — Telephone Encounter (Signed)
Pt has been notified of Myoview results by phone with verbal understanding. Pt thanked me for the call today with the good news.

## 2017-04-22 DIAGNOSIS — N186 End stage renal disease: Secondary | ICD-10-CM | POA: Diagnosis not present

## 2017-04-22 DIAGNOSIS — N2581 Secondary hyperparathyroidism of renal origin: Secondary | ICD-10-CM | POA: Diagnosis not present

## 2017-04-23 ENCOUNTER — Encounter (HOSPITAL_BASED_OUTPATIENT_CLINIC_OR_DEPARTMENT_OTHER): Payer: BLUE CROSS/BLUE SHIELD | Attending: Surgery

## 2017-04-23 DIAGNOSIS — G4733 Obstructive sleep apnea (adult) (pediatric): Secondary | ICD-10-CM | POA: Diagnosis not present

## 2017-04-23 DIAGNOSIS — I120 Hypertensive chronic kidney disease with stage 5 chronic kidney disease or end stage renal disease: Secondary | ICD-10-CM | POA: Insufficient documentation

## 2017-04-23 DIAGNOSIS — I890 Lymphedema, not elsewhere classified: Secondary | ICD-10-CM | POA: Insufficient documentation

## 2017-04-23 DIAGNOSIS — L97812 Non-pressure chronic ulcer of other part of right lower leg with fat layer exposed: Secondary | ICD-10-CM | POA: Diagnosis not present

## 2017-04-23 DIAGNOSIS — E11621 Type 2 diabetes mellitus with foot ulcer: Secondary | ICD-10-CM | POA: Insufficient documentation

## 2017-04-23 DIAGNOSIS — E1122 Type 2 diabetes mellitus with diabetic chronic kidney disease: Secondary | ICD-10-CM | POA: Insufficient documentation

## 2017-04-23 DIAGNOSIS — Z6841 Body Mass Index (BMI) 40.0 and over, adult: Secondary | ICD-10-CM | POA: Diagnosis not present

## 2017-04-23 DIAGNOSIS — Z992 Dependence on renal dialysis: Secondary | ICD-10-CM | POA: Insufficient documentation

## 2017-04-23 DIAGNOSIS — N186 End stage renal disease: Secondary | ICD-10-CM | POA: Diagnosis not present

## 2017-04-23 DIAGNOSIS — I87313 Chronic venous hypertension (idiopathic) with ulcer of bilateral lower extremity: Secondary | ICD-10-CM | POA: Diagnosis not present

## 2017-04-23 DIAGNOSIS — L97822 Non-pressure chronic ulcer of other part of left lower leg with fat layer exposed: Secondary | ICD-10-CM | POA: Insufficient documentation

## 2017-04-23 DIAGNOSIS — E11622 Type 2 diabetes mellitus with other skin ulcer: Secondary | ICD-10-CM | POA: Diagnosis not present

## 2017-04-24 DIAGNOSIS — N2581 Secondary hyperparathyroidism of renal origin: Secondary | ICD-10-CM | POA: Diagnosis not present

## 2017-04-24 DIAGNOSIS — N186 End stage renal disease: Secondary | ICD-10-CM | POA: Diagnosis not present

## 2017-04-25 DIAGNOSIS — I890 Lymphedema, not elsewhere classified: Secondary | ICD-10-CM | POA: Diagnosis not present

## 2017-04-25 DIAGNOSIS — I120 Hypertensive chronic kidney disease with stage 5 chronic kidney disease or end stage renal disease: Secondary | ICD-10-CM | POA: Diagnosis not present

## 2017-04-25 DIAGNOSIS — L97822 Non-pressure chronic ulcer of other part of left lower leg with fat layer exposed: Secondary | ICD-10-CM | POA: Diagnosis not present

## 2017-04-25 DIAGNOSIS — L97812 Non-pressure chronic ulcer of other part of right lower leg with fat layer exposed: Secondary | ICD-10-CM | POA: Diagnosis not present

## 2017-04-25 DIAGNOSIS — G4733 Obstructive sleep apnea (adult) (pediatric): Secondary | ICD-10-CM | POA: Diagnosis not present

## 2017-04-25 DIAGNOSIS — I87313 Chronic venous hypertension (idiopathic) with ulcer of bilateral lower extremity: Secondary | ICD-10-CM | POA: Diagnosis not present

## 2017-04-25 DIAGNOSIS — Z992 Dependence on renal dialysis: Secondary | ICD-10-CM | POA: Diagnosis not present

## 2017-04-25 DIAGNOSIS — E11621 Type 2 diabetes mellitus with foot ulcer: Secondary | ICD-10-CM | POA: Diagnosis not present

## 2017-04-25 DIAGNOSIS — N186 End stage renal disease: Secondary | ICD-10-CM | POA: Diagnosis not present

## 2017-04-25 DIAGNOSIS — Z6841 Body Mass Index (BMI) 40.0 and over, adult: Secondary | ICD-10-CM | POA: Diagnosis not present

## 2017-04-25 DIAGNOSIS — E1122 Type 2 diabetes mellitus with diabetic chronic kidney disease: Secondary | ICD-10-CM | POA: Diagnosis not present

## 2017-04-26 DIAGNOSIS — N2581 Secondary hyperparathyroidism of renal origin: Secondary | ICD-10-CM | POA: Diagnosis not present

## 2017-04-26 DIAGNOSIS — N186 End stage renal disease: Secondary | ICD-10-CM | POA: Diagnosis not present

## 2017-04-29 DIAGNOSIS — N2581 Secondary hyperparathyroidism of renal origin: Secondary | ICD-10-CM | POA: Diagnosis not present

## 2017-04-29 DIAGNOSIS — N186 End stage renal disease: Secondary | ICD-10-CM | POA: Diagnosis not present

## 2017-04-29 DIAGNOSIS — L299 Pruritus, unspecified: Secondary | ICD-10-CM | POA: Diagnosis not present

## 2017-04-30 DIAGNOSIS — E1122 Type 2 diabetes mellitus with diabetic chronic kidney disease: Secondary | ICD-10-CM | POA: Diagnosis not present

## 2017-04-30 DIAGNOSIS — E11621 Type 2 diabetes mellitus with foot ulcer: Secondary | ICD-10-CM | POA: Diagnosis not present

## 2017-04-30 DIAGNOSIS — Z992 Dependence on renal dialysis: Secondary | ICD-10-CM | POA: Diagnosis not present

## 2017-04-30 DIAGNOSIS — I87313 Chronic venous hypertension (idiopathic) with ulcer of bilateral lower extremity: Secondary | ICD-10-CM | POA: Diagnosis not present

## 2017-04-30 DIAGNOSIS — G4733 Obstructive sleep apnea (adult) (pediatric): Secondary | ICD-10-CM | POA: Diagnosis not present

## 2017-04-30 DIAGNOSIS — Z6841 Body Mass Index (BMI) 40.0 and over, adult: Secondary | ICD-10-CM | POA: Diagnosis not present

## 2017-04-30 DIAGNOSIS — N186 End stage renal disease: Secondary | ICD-10-CM | POA: Diagnosis not present

## 2017-04-30 DIAGNOSIS — L97822 Non-pressure chronic ulcer of other part of left lower leg with fat layer exposed: Secondary | ICD-10-CM | POA: Diagnosis not present

## 2017-04-30 DIAGNOSIS — I890 Lymphedema, not elsewhere classified: Secondary | ICD-10-CM | POA: Diagnosis not present

## 2017-04-30 DIAGNOSIS — E11622 Type 2 diabetes mellitus with other skin ulcer: Secondary | ICD-10-CM | POA: Diagnosis not present

## 2017-04-30 DIAGNOSIS — I120 Hypertensive chronic kidney disease with stage 5 chronic kidney disease or end stage renal disease: Secondary | ICD-10-CM | POA: Diagnosis not present

## 2017-04-30 DIAGNOSIS — L97812 Non-pressure chronic ulcer of other part of right lower leg with fat layer exposed: Secondary | ICD-10-CM | POA: Diagnosis not present

## 2017-05-01 DIAGNOSIS — Z992 Dependence on renal dialysis: Secondary | ICD-10-CM | POA: Diagnosis not present

## 2017-05-01 DIAGNOSIS — N2581 Secondary hyperparathyroidism of renal origin: Secondary | ICD-10-CM | POA: Diagnosis not present

## 2017-05-01 DIAGNOSIS — E1122 Type 2 diabetes mellitus with diabetic chronic kidney disease: Secondary | ICD-10-CM | POA: Diagnosis not present

## 2017-05-01 DIAGNOSIS — L299 Pruritus, unspecified: Secondary | ICD-10-CM | POA: Diagnosis not present

## 2017-05-01 DIAGNOSIS — N186 End stage renal disease: Secondary | ICD-10-CM | POA: Diagnosis not present

## 2017-05-03 DIAGNOSIS — N186 End stage renal disease: Secondary | ICD-10-CM | POA: Diagnosis not present

## 2017-05-03 DIAGNOSIS — N2581 Secondary hyperparathyroidism of renal origin: Secondary | ICD-10-CM | POA: Diagnosis not present

## 2017-05-05 ENCOUNTER — Other Ambulatory Visit: Payer: Self-pay | Admitting: Surgery

## 2017-05-05 ENCOUNTER — Ambulatory Visit (HOSPITAL_COMMUNITY)
Admission: RE | Admit: 2017-05-05 | Discharge: 2017-05-05 | Disposition: A | Discharge status: Home / Self Care | Payer: BLUE CROSS/BLUE SHIELD | Source: Ambulatory Visit | Attending: Surgery | Admitting: Surgery

## 2017-05-05 DIAGNOSIS — I8391 Asymptomatic varicose veins of right lower extremity: Secondary | ICD-10-CM | POA: Diagnosis not present

## 2017-05-05 DIAGNOSIS — L98499 Non-pressure chronic ulcer of skin of other sites with unspecified severity: Secondary | ICD-10-CM | POA: Diagnosis not present

## 2017-05-05 DIAGNOSIS — L97909 Non-pressure chronic ulcer of unspecified part of unspecified lower leg with unspecified severity: Secondary | ICD-10-CM | POA: Insufficient documentation

## 2017-05-06 DIAGNOSIS — N2581 Secondary hyperparathyroidism of renal origin: Secondary | ICD-10-CM | POA: Diagnosis not present

## 2017-05-06 DIAGNOSIS — N186 End stage renal disease: Secondary | ICD-10-CM | POA: Diagnosis not present

## 2017-05-07 ENCOUNTER — Encounter (HOSPITAL_BASED_OUTPATIENT_CLINIC_OR_DEPARTMENT_OTHER): Payer: BLUE CROSS/BLUE SHIELD | Attending: Surgery

## 2017-05-07 DIAGNOSIS — N186 End stage renal disease: Secondary | ICD-10-CM | POA: Diagnosis not present

## 2017-05-07 DIAGNOSIS — L97812 Non-pressure chronic ulcer of other part of right lower leg with fat layer exposed: Secondary | ICD-10-CM | POA: Insufficient documentation

## 2017-05-07 DIAGNOSIS — G4730 Sleep apnea, unspecified: Secondary | ICD-10-CM | POA: Insufficient documentation

## 2017-05-07 DIAGNOSIS — I87313 Chronic venous hypertension (idiopathic) with ulcer of bilateral lower extremity: Secondary | ICD-10-CM | POA: Diagnosis not present

## 2017-05-07 DIAGNOSIS — Z992 Dependence on renal dialysis: Secondary | ICD-10-CM | POA: Diagnosis not present

## 2017-05-07 DIAGNOSIS — L97822 Non-pressure chronic ulcer of other part of left lower leg with fat layer exposed: Secondary | ICD-10-CM | POA: Diagnosis not present

## 2017-05-07 DIAGNOSIS — E11622 Type 2 diabetes mellitus with other skin ulcer: Secondary | ICD-10-CM | POA: Diagnosis not present

## 2017-05-07 DIAGNOSIS — I890 Lymphedema, not elsewhere classified: Secondary | ICD-10-CM | POA: Diagnosis not present

## 2017-05-07 DIAGNOSIS — E1122 Type 2 diabetes mellitus with diabetic chronic kidney disease: Secondary | ICD-10-CM | POA: Diagnosis not present

## 2017-05-07 DIAGNOSIS — I120 Hypertensive chronic kidney disease with stage 5 chronic kidney disease or end stage renal disease: Secondary | ICD-10-CM | POA: Insufficient documentation

## 2017-05-08 DIAGNOSIS — N186 End stage renal disease: Secondary | ICD-10-CM | POA: Diagnosis not present

## 2017-05-08 DIAGNOSIS — N2581 Secondary hyperparathyroidism of renal origin: Secondary | ICD-10-CM | POA: Diagnosis not present

## 2017-05-10 DIAGNOSIS — N2581 Secondary hyperparathyroidism of renal origin: Secondary | ICD-10-CM | POA: Diagnosis not present

## 2017-05-10 DIAGNOSIS — N186 End stage renal disease: Secondary | ICD-10-CM | POA: Diagnosis not present

## 2017-05-13 DIAGNOSIS — N2581 Secondary hyperparathyroidism of renal origin: Secondary | ICD-10-CM | POA: Diagnosis not present

## 2017-05-13 DIAGNOSIS — L299 Pruritus, unspecified: Secondary | ICD-10-CM | POA: Diagnosis not present

## 2017-05-13 DIAGNOSIS — Z23 Encounter for immunization: Secondary | ICD-10-CM | POA: Diagnosis not present

## 2017-05-13 DIAGNOSIS — N186 End stage renal disease: Secondary | ICD-10-CM | POA: Diagnosis not present

## 2017-05-14 DIAGNOSIS — N186 End stage renal disease: Secondary | ICD-10-CM | POA: Diagnosis not present

## 2017-05-14 DIAGNOSIS — I890 Lymphedema, not elsewhere classified: Secondary | ICD-10-CM | POA: Diagnosis not present

## 2017-05-14 DIAGNOSIS — L97822 Non-pressure chronic ulcer of other part of left lower leg with fat layer exposed: Secondary | ICD-10-CM | POA: Diagnosis not present

## 2017-05-14 DIAGNOSIS — I87313 Chronic venous hypertension (idiopathic) with ulcer of bilateral lower extremity: Secondary | ICD-10-CM | POA: Diagnosis not present

## 2017-05-14 DIAGNOSIS — G4730 Sleep apnea, unspecified: Secondary | ICD-10-CM | POA: Diagnosis not present

## 2017-05-14 DIAGNOSIS — Z992 Dependence on renal dialysis: Secondary | ICD-10-CM | POA: Diagnosis not present

## 2017-05-14 DIAGNOSIS — E11622 Type 2 diabetes mellitus with other skin ulcer: Secondary | ICD-10-CM | POA: Diagnosis not present

## 2017-05-14 DIAGNOSIS — I120 Hypertensive chronic kidney disease with stage 5 chronic kidney disease or end stage renal disease: Secondary | ICD-10-CM | POA: Diagnosis not present

## 2017-05-14 DIAGNOSIS — L97812 Non-pressure chronic ulcer of other part of right lower leg with fat layer exposed: Secondary | ICD-10-CM | POA: Diagnosis not present

## 2017-05-14 DIAGNOSIS — E1122 Type 2 diabetes mellitus with diabetic chronic kidney disease: Secondary | ICD-10-CM | POA: Diagnosis not present

## 2017-05-15 DIAGNOSIS — L299 Pruritus, unspecified: Secondary | ICD-10-CM | POA: Diagnosis not present

## 2017-05-15 DIAGNOSIS — Z23 Encounter for immunization: Secondary | ICD-10-CM | POA: Diagnosis not present

## 2017-05-15 DIAGNOSIS — N2581 Secondary hyperparathyroidism of renal origin: Secondary | ICD-10-CM | POA: Diagnosis not present

## 2017-05-15 DIAGNOSIS — N186 End stage renal disease: Secondary | ICD-10-CM | POA: Diagnosis not present

## 2017-05-17 DIAGNOSIS — N186 End stage renal disease: Secondary | ICD-10-CM | POA: Diagnosis not present

## 2017-05-17 DIAGNOSIS — Z23 Encounter for immunization: Secondary | ICD-10-CM | POA: Diagnosis not present

## 2017-05-17 DIAGNOSIS — L299 Pruritus, unspecified: Secondary | ICD-10-CM | POA: Diagnosis not present

## 2017-05-17 DIAGNOSIS — N2581 Secondary hyperparathyroidism of renal origin: Secondary | ICD-10-CM | POA: Diagnosis not present

## 2017-05-20 DIAGNOSIS — N2581 Secondary hyperparathyroidism of renal origin: Secondary | ICD-10-CM | POA: Diagnosis not present

## 2017-05-20 DIAGNOSIS — N186 End stage renal disease: Secondary | ICD-10-CM | POA: Diagnosis not present

## 2017-05-20 DIAGNOSIS — R51 Headache: Secondary | ICD-10-CM | POA: Diagnosis not present

## 2017-05-21 DIAGNOSIS — G4730 Sleep apnea, unspecified: Secondary | ICD-10-CM | POA: Diagnosis not present

## 2017-05-21 DIAGNOSIS — I87313 Chronic venous hypertension (idiopathic) with ulcer of bilateral lower extremity: Secondary | ICD-10-CM | POA: Diagnosis not present

## 2017-05-21 DIAGNOSIS — E1122 Type 2 diabetes mellitus with diabetic chronic kidney disease: Secondary | ICD-10-CM | POA: Diagnosis not present

## 2017-05-21 DIAGNOSIS — N186 End stage renal disease: Secondary | ICD-10-CM | POA: Diagnosis not present

## 2017-05-21 DIAGNOSIS — I872 Venous insufficiency (chronic) (peripheral): Secondary | ICD-10-CM | POA: Diagnosis not present

## 2017-05-21 DIAGNOSIS — I890 Lymphedema, not elsewhere classified: Secondary | ICD-10-CM | POA: Diagnosis not present

## 2017-05-21 DIAGNOSIS — I120 Hypertensive chronic kidney disease with stage 5 chronic kidney disease or end stage renal disease: Secondary | ICD-10-CM | POA: Diagnosis not present

## 2017-05-21 DIAGNOSIS — L97822 Non-pressure chronic ulcer of other part of left lower leg with fat layer exposed: Secondary | ICD-10-CM | POA: Diagnosis not present

## 2017-05-21 DIAGNOSIS — L97812 Non-pressure chronic ulcer of other part of right lower leg with fat layer exposed: Secondary | ICD-10-CM | POA: Diagnosis not present

## 2017-05-21 DIAGNOSIS — Z992 Dependence on renal dialysis: Secondary | ICD-10-CM | POA: Diagnosis not present

## 2017-05-21 DIAGNOSIS — E11622 Type 2 diabetes mellitus with other skin ulcer: Secondary | ICD-10-CM | POA: Diagnosis not present

## 2017-05-22 DIAGNOSIS — N2581 Secondary hyperparathyroidism of renal origin: Secondary | ICD-10-CM | POA: Diagnosis not present

## 2017-05-22 DIAGNOSIS — R51 Headache: Secondary | ICD-10-CM | POA: Diagnosis not present

## 2017-05-22 DIAGNOSIS — N186 End stage renal disease: Secondary | ICD-10-CM | POA: Diagnosis not present

## 2017-05-23 DIAGNOSIS — L97919 Non-pressure chronic ulcer of unspecified part of right lower leg with unspecified severity: Secondary | ICD-10-CM | POA: Diagnosis not present

## 2017-05-24 DIAGNOSIS — R51 Headache: Secondary | ICD-10-CM | POA: Diagnosis not present

## 2017-05-24 DIAGNOSIS — N2581 Secondary hyperparathyroidism of renal origin: Secondary | ICD-10-CM | POA: Diagnosis not present

## 2017-05-24 DIAGNOSIS — N186 End stage renal disease: Secondary | ICD-10-CM | POA: Diagnosis not present

## 2017-05-26 ENCOUNTER — Encounter: Payer: Self-pay | Admitting: Vascular Surgery

## 2017-05-26 ENCOUNTER — Ambulatory Visit (INDEPENDENT_AMBULATORY_CARE_PROVIDER_SITE_OTHER): Payer: BLUE CROSS/BLUE SHIELD | Admitting: Vascular Surgery

## 2017-05-26 VITALS — BP 144/79 | HR 99 | Temp 97.7°F | Resp 16 | Ht 67.0 in | Wt 267.0 lb

## 2017-05-26 DIAGNOSIS — I83893 Varicose veins of bilateral lower extremities with other complications: Secondary | ICD-10-CM

## 2017-05-26 NOTE — [place] Notes (Signed)
Subjective:     Patient ID: Brenda Arnold, female   DOB: 30-Dec-1955, 61 y.o.   MRN: [id number]  HPI This 61 year old female was referred by Dr. Con Memos and the wound center. The patient had an abnormal venous reflux exam in the right lower extremity. The patient was started on hemodialysis in March 2018. Shortly thereafter she developed ulcers on the lateral aspect of both lower legs. She has been having chronic discomfort in both legs which worsens when her legs are elevated. She has been going to the wound center with some slight improvement in the ulcerations which have been debrided. She has no history of DVT thrombophlebitis or bleeding. The discomfort is in the lateral aspect of her legs where the ulcers are located and in the feet.  Past Medical History:  Diagnosis Date  . Anemia   . Arthritis   . Asthma   . CKD (chronic kidney disease), stage IV ([place])   . Complication of anesthesia    difficulty with getting oxygen saturation up  . Diabetes mellitus   . Dyslipidemia   . Epistaxis 11/05/2012  . History of nuclear stress test    Myoview 5/18: EF 68, no infarct or ischemia, low risk  . HTN (hypertension)   . Hyperlipidemia   . Hyperparathyroidism due to renal insufficiency ([place])   . Obesity    s/p panniculectomy  . Paroxysmal A-fib (HCC)    a. chronic coumadin;  b. 12/2009 Echo: EF 60-65%, Gr 1 DD.  Marland Kitchen PCOS (polycystic ovarian syndrome)   . Pneumonia   . Seasonal allergies   . Sleep apnea    a. not using CPAP, last study  >8 yrs  . Vitamin D deficiency     Social History  Substance Use Topics  . Smoking status: Never Smoker  . Smokeless tobacco: Never Used  . Alcohol use No    Family History  Problem Relation Age of Onset  . Lung cancer Father        died @ 51  . Hypertension Mother        alive @ 71  . Diabetes Brother   . Kidney disease Brother     Allergies  Allergen Reactions  . Iodine Shortness Of Breath  . Other Shortness Of Breath  . Shellfish  Allergy Shortness Of Breath  . Norvasc [Amlodipine] Swelling     Current Outpatient Prescriptions:  .  gabapentin (NEURONTIN) 100 MG capsule, Take 100 mg by mouth at bedtime., Disp: , Rfl: 3 .  HYDROcodone-acetaminophen (NORCO/VICODIN) 5-325 MG tablet, TAKE 1 TABLET BY MOUTH AS NEEDED UP TO THREE TIMES DAILY FOR LEG PAIN, Disp: , Rfl: 0 .  lidocaine-prilocaine (EMLA) cream, APPLY A SMALL AMOUNT TO SKIN AS DIRECTED APPLY TO AVF- PRIOR TO DIALYSIS, Disp: , Rfl: 12 .  MATZIM LA 180 MG 24 hr tablet, Take 180 mg by mouth daily. , Disp: , Rfl:  .  cephALEXin (KEFLEX) 500 MG capsule, Take 1 capsule (500 mg total) by mouth 2 (two) times daily. (Patient not taking: Reported on 04/20/2017), Disp: 14 capsule, Rfl: 0 .  doxycycline (VIBRAMYCIN) 100 MG capsule, Take 1 capsule (100 mg total) by mouth 2 (two) times daily. (Patient not taking: Reported on 05/26/2017), Disp: 14 capsule, Rfl: 0 .  LORazepam (ATIVAN) 1 MG tablet, Take 1-2 tablets (1-2 mg total) by mouth every 8 (eight) hours as needed for anxiety. (Patient not taking: Reported on 04/20/2017), Disp: 10 tablet, Rfl: 0 .  losartan (COZAAR) 100 MG tablet, TAKE 1  TABLET DAILY (Patient not taking: Reported on 04/20/2017), Disp: 90 tablet, Rfl: 3 .  oxyCODONE-acetaminophen (TYLOX) 5-500 MG per capsule, Take 1 capsule by mouth every 4 (four) hours as needed for pain. (Patient not taking: Reported on 04/20/2017), Disp: 30 capsule, Rfl: 0 .  SANTYL ointment, , Disp: , Rfl:   Vitals:   05/26/17 1018 05/26/17 1021  BP: (!) 152/77 (!) 144/79  Pulse: 99 99  Resp: 16   Temp: 97.7 F (36.5 C)   TempSrc: Oral   SpO2: 97%   Weight: 267 lb (121.1 kg)   Height: 5\' 7"  (1.702 m)     Body mass index is 41.82 kg/m.         Review of Systems denies chest pain but does have dyspnea on exertion. Unable to ambulate without walker since she started on hemodialysis. Has chronic obesity. Denies hemoptysis. See history of present illness     Objective:    Physical Exam BP (!) 144/79 (BP Location: Right Arm, Patient Position: Sitting, Cuff Size: Large)   Pulse 99   Temp 97.7 F (36.5 C) (Oral)   Resp 16   Ht 5\' 7"  (1.702 m)   Wt 267 lb (121.1 kg)   SpO2 97%   BMI 41.82 kg/m     Gen.-alert and oriented x3 in no apparent distress-obese HEENT normal for age Lungs no rhonchi or wheezing Cardiovascular regular rhythm no murmurs carotid pulses 3+ palpable no bruits audible Abdomen soft nontender no palpable masses-obese Musculoskeletal free of  major deformities Skin clear -no rashes Neurologic normal Lower extremities 3+ femoral and dorsalis pedis pulses palpable bilaterally with 1+ edema bilaterally Hyperpigmentation lower third both legs Ulcer lateral aspect right leg measuring about 2-1/2 cm in diameter which is clean. Ulcer lateral aspect of left leg measuring 6 x 3 cm which also appears clean and uninfected. No bulging varicosities noted  Today I reviewed the results of the formal venous reflux exam performed in our office on 05/05/2017. This reveals deep vein reflux on the right and some reflux at the saphenofemoral junction. The great saphenous veins on both legs are free of reflux and are not significantly enlarged. There is no deep vein reflux on the left. There is no DVT bilaterally.      Assessment:     #1 ulcers lateral aspect both lower extremities which started when patient began on hemodialysis and are being cared for in the wound center by Dr. Aliene Altes evidence of significant gross reflux in superficial venous system which would be amenable to laser ablation #2 deep vein reflux on the right with no DVT #3 diabetes mellitus type 2 #4 end-stage renal disease on chronic hemodialysis    Plan:     There is no indication for any venous procedures in this patient Recommendation would be elevation foot of bed 2-3 inches and either elastic compression stockings or medicated compression bandages is Dr. Con Memos is  doing Discuss this with patient and she understands that no venous intervention will improve her situation

## 2017-05-26 NOTE — [place] Notes (Signed)
Vitals:   05/26/17 1018  BP: (!) 152/77  Pulse: 99  Resp: 16  Temp: 97.7 F (36.5 C)  TempSrc: Oral  SpO2: 97%  Weight: 267 lb (121.1 kg)  Height: 5\' 7"  (1.702 m)

## 2017-05-27 ENCOUNTER — Encounter: Payer: BLUE CROSS/BLUE SHIELD | Admitting: Vascular Surgery

## 2017-05-27 DIAGNOSIS — L299 Pruritus, unspecified: Secondary | ICD-10-CM | POA: Diagnosis not present

## 2017-05-27 DIAGNOSIS — N186 End stage renal disease: Secondary | ICD-10-CM | POA: Diagnosis not present

## 2017-05-27 DIAGNOSIS — N2581 Secondary hyperparathyroidism of renal origin: Secondary | ICD-10-CM | POA: Diagnosis not present

## 2017-05-28 DIAGNOSIS — I890 Lymphedema, not elsewhere classified: Secondary | ICD-10-CM | POA: Diagnosis not present

## 2017-05-28 DIAGNOSIS — N186 End stage renal disease: Secondary | ICD-10-CM | POA: Diagnosis not present

## 2017-05-28 DIAGNOSIS — L97812 Non-pressure chronic ulcer of other part of right lower leg with fat layer exposed: Secondary | ICD-10-CM | POA: Diagnosis not present

## 2017-05-28 DIAGNOSIS — I87313 Chronic venous hypertension (idiopathic) with ulcer of bilateral lower extremity: Secondary | ICD-10-CM | POA: Diagnosis not present

## 2017-05-28 DIAGNOSIS — E11622 Type 2 diabetes mellitus with other skin ulcer: Secondary | ICD-10-CM | POA: Diagnosis not present

## 2017-05-28 DIAGNOSIS — I120 Hypertensive chronic kidney disease with stage 5 chronic kidney disease or end stage renal disease: Secondary | ICD-10-CM | POA: Diagnosis not present

## 2017-05-28 DIAGNOSIS — G4730 Sleep apnea, unspecified: Secondary | ICD-10-CM | POA: Diagnosis not present

## 2017-05-28 DIAGNOSIS — Z992 Dependence on renal dialysis: Secondary | ICD-10-CM | POA: Diagnosis not present

## 2017-05-28 DIAGNOSIS — L97822 Non-pressure chronic ulcer of other part of left lower leg with fat layer exposed: Secondary | ICD-10-CM | POA: Diagnosis not present

## 2017-05-28 DIAGNOSIS — E1122 Type 2 diabetes mellitus with diabetic chronic kidney disease: Secondary | ICD-10-CM | POA: Diagnosis not present

## 2017-05-29 DIAGNOSIS — N2581 Secondary hyperparathyroidism of renal origin: Secondary | ICD-10-CM | POA: Diagnosis not present

## 2017-05-29 DIAGNOSIS — N186 End stage renal disease: Secondary | ICD-10-CM | POA: Diagnosis not present

## 2017-05-29 DIAGNOSIS — L299 Pruritus, unspecified: Secondary | ICD-10-CM | POA: Diagnosis not present

## 2017-05-31 DIAGNOSIS — N2581 Secondary hyperparathyroidism of renal origin: Secondary | ICD-10-CM | POA: Diagnosis not present

## 2017-05-31 DIAGNOSIS — Z992 Dependence on renal dialysis: Secondary | ICD-10-CM | POA: Diagnosis not present

## 2017-05-31 DIAGNOSIS — L299 Pruritus, unspecified: Secondary | ICD-10-CM | POA: Diagnosis not present

## 2017-05-31 DIAGNOSIS — E1122 Type 2 diabetes mellitus with diabetic chronic kidney disease: Secondary | ICD-10-CM | POA: Diagnosis not present

## 2017-05-31 DIAGNOSIS — N186 End stage renal disease: Secondary | ICD-10-CM | POA: Diagnosis not present

## 2017-06-03 DIAGNOSIS — N2581 Secondary hyperparathyroidism of renal origin: Secondary | ICD-10-CM | POA: Diagnosis not present

## 2017-06-03 DIAGNOSIS — R52 Pain, unspecified: Secondary | ICD-10-CM | POA: Diagnosis not present

## 2017-06-03 DIAGNOSIS — N186 End stage renal disease: Secondary | ICD-10-CM | POA: Diagnosis not present

## 2017-06-05 DIAGNOSIS — N186 End stage renal disease: Secondary | ICD-10-CM | POA: Diagnosis not present

## 2017-06-05 DIAGNOSIS — N2581 Secondary hyperparathyroidism of renal origin: Secondary | ICD-10-CM | POA: Diagnosis not present

## 2017-06-05 DIAGNOSIS — R52 Pain, unspecified: Secondary | ICD-10-CM | POA: Diagnosis not present

## 2017-06-07 DIAGNOSIS — R52 Pain, unspecified: Secondary | ICD-10-CM | POA: Diagnosis not present

## 2017-06-07 DIAGNOSIS — N2581 Secondary hyperparathyroidism of renal origin: Secondary | ICD-10-CM | POA: Diagnosis not present

## 2017-06-07 DIAGNOSIS — N186 End stage renal disease: Secondary | ICD-10-CM | POA: Diagnosis not present

## 2017-06-10 DIAGNOSIS — N186 End stage renal disease: Secondary | ICD-10-CM | POA: Diagnosis not present

## 2017-06-10 DIAGNOSIS — R509 Fever, unspecified: Secondary | ICD-10-CM | POA: Diagnosis not present

## 2017-06-10 DIAGNOSIS — N2581 Secondary hyperparathyroidism of renal origin: Secondary | ICD-10-CM | POA: Diagnosis not present

## 2017-06-10 DIAGNOSIS — L299 Pruritus, unspecified: Secondary | ICD-10-CM | POA: Diagnosis not present

## 2017-06-11 ENCOUNTER — Encounter (HOSPITAL_BASED_OUTPATIENT_CLINIC_OR_DEPARTMENT_OTHER): Payer: BLUE CROSS/BLUE SHIELD | Attending: Surgery

## 2017-06-11 DIAGNOSIS — L97822 Non-pressure chronic ulcer of other part of left lower leg with fat layer exposed: Secondary | ICD-10-CM | POA: Insufficient documentation

## 2017-06-11 DIAGNOSIS — I120 Hypertensive chronic kidney disease with stage 5 chronic kidney disease or end stage renal disease: Secondary | ICD-10-CM | POA: Diagnosis not present

## 2017-06-11 DIAGNOSIS — I890 Lymphedema, not elsewhere classified: Secondary | ICD-10-CM | POA: Diagnosis not present

## 2017-06-11 DIAGNOSIS — E1122 Type 2 diabetes mellitus with diabetic chronic kidney disease: Secondary | ICD-10-CM | POA: Diagnosis not present

## 2017-06-11 DIAGNOSIS — E11622 Type 2 diabetes mellitus with other skin ulcer: Secondary | ICD-10-CM | POA: Diagnosis not present

## 2017-06-11 DIAGNOSIS — L97529 Non-pressure chronic ulcer of other part of left foot with unspecified severity: Secondary | ICD-10-CM | POA: Diagnosis not present

## 2017-06-11 DIAGNOSIS — L97812 Non-pressure chronic ulcer of other part of right lower leg with fat layer exposed: Secondary | ICD-10-CM | POA: Diagnosis not present

## 2017-06-11 DIAGNOSIS — N186 End stage renal disease: Secondary | ICD-10-CM | POA: Insufficient documentation

## 2017-06-11 DIAGNOSIS — I87313 Chronic venous hypertension (idiopathic) with ulcer of bilateral lower extremity: Secondary | ICD-10-CM | POA: Insufficient documentation

## 2017-06-11 DIAGNOSIS — Z992 Dependence on renal dialysis: Secondary | ICD-10-CM | POA: Diagnosis not present

## 2017-06-12 DIAGNOSIS — N2581 Secondary hyperparathyroidism of renal origin: Secondary | ICD-10-CM | POA: Diagnosis not present

## 2017-06-12 DIAGNOSIS — N186 End stage renal disease: Secondary | ICD-10-CM | POA: Diagnosis not present

## 2017-06-12 DIAGNOSIS — R509 Fever, unspecified: Secondary | ICD-10-CM | POA: Diagnosis not present

## 2017-06-12 DIAGNOSIS — L299 Pruritus, unspecified: Secondary | ICD-10-CM | POA: Diagnosis not present

## 2017-06-13 DIAGNOSIS — G894 Chronic pain syndrome: Secondary | ICD-10-CM | POA: Diagnosis not present

## 2017-06-14 DIAGNOSIS — L299 Pruritus, unspecified: Secondary | ICD-10-CM | POA: Diagnosis not present

## 2017-06-14 DIAGNOSIS — R509 Fever, unspecified: Secondary | ICD-10-CM | POA: Diagnosis not present

## 2017-06-14 DIAGNOSIS — N186 End stage renal disease: Secondary | ICD-10-CM | POA: Diagnosis not present

## 2017-06-14 DIAGNOSIS — N2581 Secondary hyperparathyroidism of renal origin: Secondary | ICD-10-CM | POA: Diagnosis not present

## 2017-06-17 DIAGNOSIS — N2581 Secondary hyperparathyroidism of renal origin: Secondary | ICD-10-CM | POA: Diagnosis not present

## 2017-06-17 DIAGNOSIS — N186 End stage renal disease: Secondary | ICD-10-CM | POA: Diagnosis not present

## 2017-06-18 DIAGNOSIS — L97529 Non-pressure chronic ulcer of other part of left foot with unspecified severity: Secondary | ICD-10-CM | POA: Diagnosis not present

## 2017-06-18 DIAGNOSIS — L97812 Non-pressure chronic ulcer of other part of right lower leg with fat layer exposed: Secondary | ICD-10-CM | POA: Diagnosis not present

## 2017-06-18 DIAGNOSIS — I890 Lymphedema, not elsewhere classified: Secondary | ICD-10-CM | POA: Diagnosis not present

## 2017-06-18 DIAGNOSIS — Z992 Dependence on renal dialysis: Secondary | ICD-10-CM | POA: Diagnosis not present

## 2017-06-18 DIAGNOSIS — L97822 Non-pressure chronic ulcer of other part of left lower leg with fat layer exposed: Secondary | ICD-10-CM | POA: Diagnosis not present

## 2017-06-18 DIAGNOSIS — I120 Hypertensive chronic kidney disease with stage 5 chronic kidney disease or end stage renal disease: Secondary | ICD-10-CM | POA: Diagnosis not present

## 2017-06-18 DIAGNOSIS — N186 End stage renal disease: Secondary | ICD-10-CM | POA: Diagnosis not present

## 2017-06-18 DIAGNOSIS — E11622 Type 2 diabetes mellitus with other skin ulcer: Secondary | ICD-10-CM | POA: Diagnosis not present

## 2017-06-18 DIAGNOSIS — I87313 Chronic venous hypertension (idiopathic) with ulcer of bilateral lower extremity: Secondary | ICD-10-CM | POA: Diagnosis not present

## 2017-06-18 DIAGNOSIS — E1122 Type 2 diabetes mellitus with diabetic chronic kidney disease: Secondary | ICD-10-CM | POA: Diagnosis not present

## 2017-06-19 DIAGNOSIS — N2581 Secondary hyperparathyroidism of renal origin: Secondary | ICD-10-CM | POA: Diagnosis not present

## 2017-06-19 DIAGNOSIS — N186 End stage renal disease: Secondary | ICD-10-CM | POA: Diagnosis not present

## 2017-06-20 DIAGNOSIS — Z992 Dependence on renal dialysis: Secondary | ICD-10-CM | POA: Diagnosis not present

## 2017-06-20 DIAGNOSIS — I890 Lymphedema, not elsewhere classified: Secondary | ICD-10-CM | POA: Diagnosis not present

## 2017-06-20 DIAGNOSIS — L97212 Non-pressure chronic ulcer of right calf with fat layer exposed: Secondary | ICD-10-CM | POA: Diagnosis not present

## 2017-06-20 DIAGNOSIS — N186 End stage renal disease: Secondary | ICD-10-CM | POA: Diagnosis not present

## 2017-06-20 DIAGNOSIS — L97222 Non-pressure chronic ulcer of left calf with fat layer exposed: Secondary | ICD-10-CM | POA: Diagnosis not present

## 2017-06-20 DIAGNOSIS — E1122 Type 2 diabetes mellitus with diabetic chronic kidney disease: Secondary | ICD-10-CM | POA: Diagnosis not present

## 2017-06-20 DIAGNOSIS — R238 Other skin changes: Secondary | ICD-10-CM | POA: Diagnosis not present

## 2017-06-20 DIAGNOSIS — I87313 Chronic venous hypertension (idiopathic) with ulcer of bilateral lower extremity: Secondary | ICD-10-CM | POA: Diagnosis not present

## 2017-06-21 DIAGNOSIS — N2581 Secondary hyperparathyroidism of renal origin: Secondary | ICD-10-CM | POA: Diagnosis not present

## 2017-06-21 DIAGNOSIS — N186 End stage renal disease: Secondary | ICD-10-CM | POA: Diagnosis not present

## 2017-06-24 DIAGNOSIS — N2581 Secondary hyperparathyroidism of renal origin: Secondary | ICD-10-CM | POA: Diagnosis not present

## 2017-06-24 DIAGNOSIS — N186 End stage renal disease: Secondary | ICD-10-CM | POA: Diagnosis not present

## 2017-06-25 DIAGNOSIS — Z992 Dependence on renal dialysis: Secondary | ICD-10-CM | POA: Diagnosis not present

## 2017-06-25 DIAGNOSIS — L97212 Non-pressure chronic ulcer of right calf with fat layer exposed: Secondary | ICD-10-CM | POA: Diagnosis not present

## 2017-06-25 DIAGNOSIS — L97222 Non-pressure chronic ulcer of left calf with fat layer exposed: Secondary | ICD-10-CM | POA: Diagnosis not present

## 2017-06-25 DIAGNOSIS — L97529 Non-pressure chronic ulcer of other part of left foot with unspecified severity: Secondary | ICD-10-CM | POA: Diagnosis not present

## 2017-06-25 DIAGNOSIS — E11622 Type 2 diabetes mellitus with other skin ulcer: Secondary | ICD-10-CM | POA: Diagnosis not present

## 2017-06-25 DIAGNOSIS — I890 Lymphedema, not elsewhere classified: Secondary | ICD-10-CM | POA: Diagnosis not present

## 2017-06-25 DIAGNOSIS — I87313 Chronic venous hypertension (idiopathic) with ulcer of bilateral lower extremity: Secondary | ICD-10-CM | POA: Diagnosis not present

## 2017-06-25 DIAGNOSIS — N186 End stage renal disease: Secondary | ICD-10-CM | POA: Diagnosis not present

## 2017-06-25 DIAGNOSIS — R238 Other skin changes: Secondary | ICD-10-CM | POA: Diagnosis not present

## 2017-06-25 DIAGNOSIS — L97812 Non-pressure chronic ulcer of other part of right lower leg with fat layer exposed: Secondary | ICD-10-CM | POA: Diagnosis not present

## 2017-06-25 DIAGNOSIS — I120 Hypertensive chronic kidney disease with stage 5 chronic kidney disease or end stage renal disease: Secondary | ICD-10-CM | POA: Diagnosis not present

## 2017-06-25 DIAGNOSIS — S91302A Unspecified open wound, left foot, initial encounter: Secondary | ICD-10-CM | POA: Diagnosis not present

## 2017-06-25 DIAGNOSIS — E1122 Type 2 diabetes mellitus with diabetic chronic kidney disease: Secondary | ICD-10-CM | POA: Diagnosis not present

## 2017-06-25 DIAGNOSIS — L97822 Non-pressure chronic ulcer of other part of left lower leg with fat layer exposed: Secondary | ICD-10-CM | POA: Diagnosis not present

## 2017-06-26 DIAGNOSIS — N186 End stage renal disease: Secondary | ICD-10-CM | POA: Diagnosis not present

## 2017-06-26 DIAGNOSIS — N2581 Secondary hyperparathyroidism of renal origin: Secondary | ICD-10-CM | POA: Diagnosis not present

## 2017-06-27 DIAGNOSIS — R238 Other skin changes: Secondary | ICD-10-CM | POA: Diagnosis not present

## 2017-06-27 DIAGNOSIS — L97222 Non-pressure chronic ulcer of left calf with fat layer exposed: Secondary | ICD-10-CM | POA: Diagnosis not present

## 2017-06-27 DIAGNOSIS — L97212 Non-pressure chronic ulcer of right calf with fat layer exposed: Secondary | ICD-10-CM | POA: Diagnosis not present

## 2017-06-27 DIAGNOSIS — E1122 Type 2 diabetes mellitus with diabetic chronic kidney disease: Secondary | ICD-10-CM | POA: Diagnosis not present

## 2017-06-27 DIAGNOSIS — N186 End stage renal disease: Secondary | ICD-10-CM | POA: Diagnosis not present

## 2017-06-27 DIAGNOSIS — I87313 Chronic venous hypertension (idiopathic) with ulcer of bilateral lower extremity: Secondary | ICD-10-CM | POA: Diagnosis not present

## 2017-06-27 DIAGNOSIS — Z992 Dependence on renal dialysis: Secondary | ICD-10-CM | POA: Diagnosis not present

## 2017-06-27 DIAGNOSIS — I890 Lymphedema, not elsewhere classified: Secondary | ICD-10-CM | POA: Diagnosis not present

## 2017-06-28 DIAGNOSIS — N186 End stage renal disease: Secondary | ICD-10-CM | POA: Diagnosis not present

## 2017-06-28 DIAGNOSIS — N2581 Secondary hyperparathyroidism of renal origin: Secondary | ICD-10-CM | POA: Diagnosis not present

## 2017-06-30 DIAGNOSIS — M169 Osteoarthritis of hip, unspecified: Secondary | ICD-10-CM | POA: Diagnosis not present

## 2017-06-30 DIAGNOSIS — E1121 Type 2 diabetes mellitus with diabetic nephropathy: Secondary | ICD-10-CM | POA: Diagnosis not present

## 2017-06-30 DIAGNOSIS — I129 Hypertensive chronic kidney disease with stage 1 through stage 4 chronic kidney disease, or unspecified chronic kidney disease: Secondary | ICD-10-CM | POA: Diagnosis not present

## 2017-06-30 DIAGNOSIS — Z992 Dependence on renal dialysis: Secondary | ICD-10-CM | POA: Diagnosis not present

## 2017-06-30 DIAGNOSIS — I480 Paroxysmal atrial fibrillation: Secondary | ICD-10-CM | POA: Diagnosis not present

## 2017-06-30 DIAGNOSIS — Z0000 Encounter for general adult medical examination without abnormal findings: Secondary | ICD-10-CM | POA: Diagnosis not present

## 2017-06-30 DIAGNOSIS — N185 Chronic kidney disease, stage 5: Secondary | ICD-10-CM | POA: Diagnosis not present

## 2017-07-01 DIAGNOSIS — N2581 Secondary hyperparathyroidism of renal origin: Secondary | ICD-10-CM | POA: Diagnosis not present

## 2017-07-01 DIAGNOSIS — Z992 Dependence on renal dialysis: Secondary | ICD-10-CM | POA: Diagnosis not present

## 2017-07-01 DIAGNOSIS — N186 End stage renal disease: Secondary | ICD-10-CM | POA: Diagnosis not present

## 2017-07-01 DIAGNOSIS — E1122 Type 2 diabetes mellitus with diabetic chronic kidney disease: Secondary | ICD-10-CM | POA: Diagnosis not present

## 2017-07-02 ENCOUNTER — Encounter (HOSPITAL_BASED_OUTPATIENT_CLINIC_OR_DEPARTMENT_OTHER): Payer: BLUE CROSS/BLUE SHIELD | Attending: Surgery

## 2017-07-02 DIAGNOSIS — L97819 Non-pressure chronic ulcer of other part of right lower leg with unspecified severity: Secondary | ICD-10-CM | POA: Insufficient documentation

## 2017-07-02 DIAGNOSIS — I87313 Chronic venous hypertension (idiopathic) with ulcer of bilateral lower extremity: Secondary | ICD-10-CM | POA: Insufficient documentation

## 2017-07-02 DIAGNOSIS — L97522 Non-pressure chronic ulcer of other part of left foot with fat layer exposed: Secondary | ICD-10-CM | POA: Insufficient documentation

## 2017-07-02 DIAGNOSIS — I120 Hypertensive chronic kidney disease with stage 5 chronic kidney disease or end stage renal disease: Secondary | ICD-10-CM | POA: Diagnosis not present

## 2017-07-02 DIAGNOSIS — L97812 Non-pressure chronic ulcer of other part of right lower leg with fat layer exposed: Secondary | ICD-10-CM | POA: Diagnosis not present

## 2017-07-02 DIAGNOSIS — L97822 Non-pressure chronic ulcer of other part of left lower leg with fat layer exposed: Secondary | ICD-10-CM | POA: Diagnosis not present

## 2017-07-02 DIAGNOSIS — N186 End stage renal disease: Secondary | ICD-10-CM | POA: Diagnosis not present

## 2017-07-02 DIAGNOSIS — G4730 Sleep apnea, unspecified: Secondary | ICD-10-CM | POA: Insufficient documentation

## 2017-07-02 DIAGNOSIS — E11621 Type 2 diabetes mellitus with foot ulcer: Secondary | ICD-10-CM | POA: Diagnosis not present

## 2017-07-02 DIAGNOSIS — E11622 Type 2 diabetes mellitus with other skin ulcer: Secondary | ICD-10-CM | POA: Insufficient documentation

## 2017-07-02 DIAGNOSIS — E1122 Type 2 diabetes mellitus with diabetic chronic kidney disease: Secondary | ICD-10-CM | POA: Diagnosis not present

## 2017-07-02 DIAGNOSIS — L97829 Non-pressure chronic ulcer of other part of left lower leg with unspecified severity: Secondary | ICD-10-CM | POA: Insufficient documentation

## 2017-07-03 DIAGNOSIS — N2581 Secondary hyperparathyroidism of renal origin: Secondary | ICD-10-CM | POA: Diagnosis not present

## 2017-07-03 DIAGNOSIS — N186 End stage renal disease: Secondary | ICD-10-CM | POA: Diagnosis not present

## 2017-07-03 DIAGNOSIS — L299 Pruritus, unspecified: Secondary | ICD-10-CM | POA: Diagnosis not present

## 2017-07-04 DIAGNOSIS — I87313 Chronic venous hypertension (idiopathic) with ulcer of bilateral lower extremity: Secondary | ICD-10-CM | POA: Diagnosis not present

## 2017-07-04 DIAGNOSIS — R238 Other skin changes: Secondary | ICD-10-CM | POA: Diagnosis not present

## 2017-07-04 DIAGNOSIS — N186 End stage renal disease: Secondary | ICD-10-CM | POA: Diagnosis not present

## 2017-07-04 DIAGNOSIS — Z992 Dependence on renal dialysis: Secondary | ICD-10-CM | POA: Diagnosis not present

## 2017-07-04 DIAGNOSIS — I890 Lymphedema, not elsewhere classified: Secondary | ICD-10-CM | POA: Diagnosis not present

## 2017-07-04 DIAGNOSIS — L97222 Non-pressure chronic ulcer of left calf with fat layer exposed: Secondary | ICD-10-CM | POA: Diagnosis not present

## 2017-07-04 DIAGNOSIS — E1122 Type 2 diabetes mellitus with diabetic chronic kidney disease: Secondary | ICD-10-CM | POA: Diagnosis not present

## 2017-07-04 DIAGNOSIS — L97212 Non-pressure chronic ulcer of right calf with fat layer exposed: Secondary | ICD-10-CM | POA: Diagnosis not present

## 2017-07-05 DIAGNOSIS — N2581 Secondary hyperparathyroidism of renal origin: Secondary | ICD-10-CM | POA: Diagnosis not present

## 2017-07-05 DIAGNOSIS — N186 End stage renal disease: Secondary | ICD-10-CM | POA: Diagnosis not present

## 2017-07-05 DIAGNOSIS — L299 Pruritus, unspecified: Secondary | ICD-10-CM | POA: Diagnosis not present

## 2017-07-07 DIAGNOSIS — N186 End stage renal disease: Secondary | ICD-10-CM | POA: Diagnosis not present

## 2017-07-07 DIAGNOSIS — L97212 Non-pressure chronic ulcer of right calf with fat layer exposed: Secondary | ICD-10-CM | POA: Diagnosis not present

## 2017-07-07 DIAGNOSIS — Z992 Dependence on renal dialysis: Secondary | ICD-10-CM | POA: Diagnosis not present

## 2017-07-07 DIAGNOSIS — R238 Other skin changes: Secondary | ICD-10-CM | POA: Diagnosis not present

## 2017-07-07 DIAGNOSIS — E1122 Type 2 diabetes mellitus with diabetic chronic kidney disease: Secondary | ICD-10-CM | POA: Diagnosis not present

## 2017-07-07 DIAGNOSIS — I890 Lymphedema, not elsewhere classified: Secondary | ICD-10-CM | POA: Diagnosis not present

## 2017-07-07 DIAGNOSIS — L97222 Non-pressure chronic ulcer of left calf with fat layer exposed: Secondary | ICD-10-CM | POA: Diagnosis not present

## 2017-07-07 DIAGNOSIS — I87313 Chronic venous hypertension (idiopathic) with ulcer of bilateral lower extremity: Secondary | ICD-10-CM | POA: Diagnosis not present

## 2017-07-08 DIAGNOSIS — L299 Pruritus, unspecified: Secondary | ICD-10-CM | POA: Diagnosis not present

## 2017-07-08 DIAGNOSIS — N186 End stage renal disease: Secondary | ICD-10-CM | POA: Diagnosis not present

## 2017-07-08 DIAGNOSIS — N2581 Secondary hyperparathyroidism of renal origin: Secondary | ICD-10-CM | POA: Diagnosis not present

## 2017-07-09 DIAGNOSIS — E11621 Type 2 diabetes mellitus with foot ulcer: Secondary | ICD-10-CM | POA: Diagnosis not present

## 2017-07-09 DIAGNOSIS — E11622 Type 2 diabetes mellitus with other skin ulcer: Secondary | ICD-10-CM | POA: Diagnosis not present

## 2017-07-09 DIAGNOSIS — L97819 Non-pressure chronic ulcer of other part of right lower leg with unspecified severity: Secondary | ICD-10-CM | POA: Diagnosis not present

## 2017-07-09 DIAGNOSIS — N186 End stage renal disease: Secondary | ICD-10-CM | POA: Diagnosis not present

## 2017-07-09 DIAGNOSIS — E1122 Type 2 diabetes mellitus with diabetic chronic kidney disease: Secondary | ICD-10-CM | POA: Diagnosis not present

## 2017-07-09 DIAGNOSIS — I120 Hypertensive chronic kidney disease with stage 5 chronic kidney disease or end stage renal disease: Secondary | ICD-10-CM | POA: Diagnosis not present

## 2017-07-09 DIAGNOSIS — G4730 Sleep apnea, unspecified: Secondary | ICD-10-CM | POA: Diagnosis not present

## 2017-07-09 DIAGNOSIS — I87313 Chronic venous hypertension (idiopathic) with ulcer of bilateral lower extremity: Secondary | ICD-10-CM | POA: Diagnosis not present

## 2017-07-09 DIAGNOSIS — L97829 Non-pressure chronic ulcer of other part of left lower leg with unspecified severity: Secondary | ICD-10-CM | POA: Diagnosis not present

## 2017-07-09 DIAGNOSIS — L97522 Non-pressure chronic ulcer of other part of left foot with fat layer exposed: Secondary | ICD-10-CM | POA: Diagnosis not present

## 2017-07-10 DIAGNOSIS — L299 Pruritus, unspecified: Secondary | ICD-10-CM | POA: Diagnosis not present

## 2017-07-10 DIAGNOSIS — N2581 Secondary hyperparathyroidism of renal origin: Secondary | ICD-10-CM | POA: Diagnosis not present

## 2017-07-10 DIAGNOSIS — N186 End stage renal disease: Secondary | ICD-10-CM | POA: Diagnosis not present

## 2017-07-12 DIAGNOSIS — L299 Pruritus, unspecified: Secondary | ICD-10-CM | POA: Diagnosis not present

## 2017-07-12 DIAGNOSIS — N186 End stage renal disease: Secondary | ICD-10-CM | POA: Diagnosis not present

## 2017-07-12 DIAGNOSIS — N2581 Secondary hyperparathyroidism of renal origin: Secondary | ICD-10-CM | POA: Diagnosis not present

## 2017-07-15 DIAGNOSIS — N186 End stage renal disease: Secondary | ICD-10-CM | POA: Diagnosis not present

## 2017-07-15 DIAGNOSIS — N2581 Secondary hyperparathyroidism of renal origin: Secondary | ICD-10-CM | POA: Diagnosis not present

## 2017-07-15 DIAGNOSIS — L299 Pruritus, unspecified: Secondary | ICD-10-CM | POA: Diagnosis not present

## 2017-07-16 ENCOUNTER — Ambulatory Visit: Payer: BLUE CROSS/BLUE SHIELD | Admitting: Podiatry

## 2017-07-16 DIAGNOSIS — I87313 Chronic venous hypertension (idiopathic) with ulcer of bilateral lower extremity: Secondary | ICD-10-CM | POA: Diagnosis not present

## 2017-07-16 DIAGNOSIS — L97829 Non-pressure chronic ulcer of other part of left lower leg with unspecified severity: Secondary | ICD-10-CM | POA: Diagnosis not present

## 2017-07-16 DIAGNOSIS — G4730 Sleep apnea, unspecified: Secondary | ICD-10-CM | POA: Diagnosis not present

## 2017-07-16 DIAGNOSIS — E11621 Type 2 diabetes mellitus with foot ulcer: Secondary | ICD-10-CM | POA: Diagnosis not present

## 2017-07-16 DIAGNOSIS — L97819 Non-pressure chronic ulcer of other part of right lower leg with unspecified severity: Secondary | ICD-10-CM | POA: Diagnosis not present

## 2017-07-16 DIAGNOSIS — E1122 Type 2 diabetes mellitus with diabetic chronic kidney disease: Secondary | ICD-10-CM | POA: Diagnosis not present

## 2017-07-16 DIAGNOSIS — E11622 Type 2 diabetes mellitus with other skin ulcer: Secondary | ICD-10-CM | POA: Diagnosis not present

## 2017-07-16 DIAGNOSIS — L97522 Non-pressure chronic ulcer of other part of left foot with fat layer exposed: Secondary | ICD-10-CM | POA: Diagnosis not present

## 2017-07-16 DIAGNOSIS — N186 End stage renal disease: Secondary | ICD-10-CM | POA: Diagnosis not present

## 2017-07-16 DIAGNOSIS — I120 Hypertensive chronic kidney disease with stage 5 chronic kidney disease or end stage renal disease: Secondary | ICD-10-CM | POA: Diagnosis not present

## 2017-07-17 DIAGNOSIS — L299 Pruritus, unspecified: Secondary | ICD-10-CM | POA: Diagnosis not present

## 2017-07-17 DIAGNOSIS — N186 End stage renal disease: Secondary | ICD-10-CM | POA: Diagnosis not present

## 2017-07-17 DIAGNOSIS — N2581 Secondary hyperparathyroidism of renal origin: Secondary | ICD-10-CM | POA: Diagnosis not present

## 2017-07-18 DIAGNOSIS — E1122 Type 2 diabetes mellitus with diabetic chronic kidney disease: Secondary | ICD-10-CM | POA: Diagnosis not present

## 2017-07-18 DIAGNOSIS — L97212 Non-pressure chronic ulcer of right calf with fat layer exposed: Secondary | ICD-10-CM | POA: Diagnosis not present

## 2017-07-18 DIAGNOSIS — I87313 Chronic venous hypertension (idiopathic) with ulcer of bilateral lower extremity: Secondary | ICD-10-CM | POA: Diagnosis not present

## 2017-07-18 DIAGNOSIS — Z992 Dependence on renal dialysis: Secondary | ICD-10-CM | POA: Diagnosis not present

## 2017-07-18 DIAGNOSIS — N186 End stage renal disease: Secondary | ICD-10-CM | POA: Diagnosis not present

## 2017-07-18 DIAGNOSIS — L97222 Non-pressure chronic ulcer of left calf with fat layer exposed: Secondary | ICD-10-CM | POA: Diagnosis not present

## 2017-07-18 DIAGNOSIS — R238 Other skin changes: Secondary | ICD-10-CM | POA: Diagnosis not present

## 2017-07-18 DIAGNOSIS — I890 Lymphedema, not elsewhere classified: Secondary | ICD-10-CM | POA: Diagnosis not present

## 2017-07-19 DIAGNOSIS — N186 End stage renal disease: Secondary | ICD-10-CM | POA: Diagnosis not present

## 2017-07-19 DIAGNOSIS — L299 Pruritus, unspecified: Secondary | ICD-10-CM | POA: Diagnosis not present

## 2017-07-19 DIAGNOSIS — N2581 Secondary hyperparathyroidism of renal origin: Secondary | ICD-10-CM | POA: Diagnosis not present

## 2017-07-21 DIAGNOSIS — L97212 Non-pressure chronic ulcer of right calf with fat layer exposed: Secondary | ICD-10-CM | POA: Diagnosis not present

## 2017-07-21 DIAGNOSIS — N186 End stage renal disease: Secondary | ICD-10-CM | POA: Diagnosis not present

## 2017-07-21 DIAGNOSIS — I87313 Chronic venous hypertension (idiopathic) with ulcer of bilateral lower extremity: Secondary | ICD-10-CM | POA: Diagnosis not present

## 2017-07-21 DIAGNOSIS — E1122 Type 2 diabetes mellitus with diabetic chronic kidney disease: Secondary | ICD-10-CM | POA: Diagnosis not present

## 2017-07-21 DIAGNOSIS — Z992 Dependence on renal dialysis: Secondary | ICD-10-CM | POA: Diagnosis not present

## 2017-07-21 DIAGNOSIS — L97222 Non-pressure chronic ulcer of left calf with fat layer exposed: Secondary | ICD-10-CM | POA: Diagnosis not present

## 2017-07-21 DIAGNOSIS — I890 Lymphedema, not elsewhere classified: Secondary | ICD-10-CM | POA: Diagnosis not present

## 2017-07-21 DIAGNOSIS — R238 Other skin changes: Secondary | ICD-10-CM | POA: Diagnosis not present

## 2017-07-22 DIAGNOSIS — N186 End stage renal disease: Secondary | ICD-10-CM | POA: Diagnosis not present

## 2017-07-22 DIAGNOSIS — L299 Pruritus, unspecified: Secondary | ICD-10-CM | POA: Diagnosis not present

## 2017-07-22 DIAGNOSIS — N2581 Secondary hyperparathyroidism of renal origin: Secondary | ICD-10-CM | POA: Diagnosis not present

## 2017-07-23 ENCOUNTER — Encounter: Payer: Self-pay | Admitting: Podiatry

## 2017-07-23 ENCOUNTER — Ambulatory Visit (INDEPENDENT_AMBULATORY_CARE_PROVIDER_SITE_OTHER): Payer: BLUE CROSS/BLUE SHIELD | Admitting: Podiatry

## 2017-07-23 DIAGNOSIS — L97829 Non-pressure chronic ulcer of other part of left lower leg with unspecified severity: Secondary | ICD-10-CM | POA: Diagnosis not present

## 2017-07-23 DIAGNOSIS — N186 End stage renal disease: Secondary | ICD-10-CM | POA: Diagnosis not present

## 2017-07-23 DIAGNOSIS — I87313 Chronic venous hypertension (idiopathic) with ulcer of bilateral lower extremity: Secondary | ICD-10-CM | POA: Diagnosis not present

## 2017-07-23 DIAGNOSIS — L97819 Non-pressure chronic ulcer of other part of right lower leg with unspecified severity: Secondary | ICD-10-CM | POA: Diagnosis not present

## 2017-07-23 DIAGNOSIS — G4730 Sleep apnea, unspecified: Secondary | ICD-10-CM | POA: Diagnosis not present

## 2017-07-23 DIAGNOSIS — M79671 Pain in right foot: Secondary | ICD-10-CM

## 2017-07-23 DIAGNOSIS — B351 Tinea unguium: Secondary | ICD-10-CM

## 2017-07-23 DIAGNOSIS — M79672 Pain in left foot: Secondary | ICD-10-CM

## 2017-07-23 DIAGNOSIS — E1122 Type 2 diabetes mellitus with diabetic chronic kidney disease: Secondary | ICD-10-CM | POA: Diagnosis not present

## 2017-07-23 DIAGNOSIS — E11621 Type 2 diabetes mellitus with foot ulcer: Secondary | ICD-10-CM | POA: Diagnosis not present

## 2017-07-23 DIAGNOSIS — I120 Hypertensive chronic kidney disease with stage 5 chronic kidney disease or end stage renal disease: Secondary | ICD-10-CM | POA: Diagnosis not present

## 2017-07-23 DIAGNOSIS — E11622 Type 2 diabetes mellitus with other skin ulcer: Secondary | ICD-10-CM | POA: Diagnosis not present

## 2017-07-23 DIAGNOSIS — L97522 Non-pressure chronic ulcer of other part of left foot with fat layer exposed: Secondary | ICD-10-CM | POA: Diagnosis not present

## 2017-07-23 NOTE — Patient Instructions (Signed)
Seen for hypertrophic nails. All nails debrided. Return in 3 months or as needed.  

## 2017-07-23 NOTE — [place] Notes (Signed)
Subjective:  61year old NIDDM female presents requesting toe nails trimmed. Using walker for ambulation. Stated that she has venous ulcers in both legs. Once these got healed off, she got another lesion developed and being treated as diabetic ulcer at the wound care center. She is on Dialysis now and was taken off of Warfarin and diabetic medication.   Objective:  Left forefoot covered with coban outer wrap and heavy dressing from wound care center. Thick and hypertrophic nails with fungal debris x 10.  Dry hyperpigmented leathery skin both lower limbs. No edema or erythema noted. Pedal pulses are all palpable.  Nails are all thick and great toe nails are very deformed.  No edema or erythema noted on lower leg.  Assessment: Onychomycosis and deformed nails x 10.  Diabetic under control.  Xerotic skin both lower limbs without open skin.  New lesion left forefoot under management by wound care center. Difficulty walking.   Plan:  All nails debrided.  Return in 3 months or sooner if needed.

## 2017-07-24 DIAGNOSIS — L299 Pruritus, unspecified: Secondary | ICD-10-CM | POA: Diagnosis not present

## 2017-07-24 DIAGNOSIS — N2581 Secondary hyperparathyroidism of renal origin: Secondary | ICD-10-CM | POA: Diagnosis not present

## 2017-07-24 DIAGNOSIS — N186 End stage renal disease: Secondary | ICD-10-CM | POA: Diagnosis not present

## 2017-07-25 DIAGNOSIS — Z992 Dependence on renal dialysis: Secondary | ICD-10-CM | POA: Diagnosis not present

## 2017-07-25 DIAGNOSIS — E1122 Type 2 diabetes mellitus with diabetic chronic kidney disease: Secondary | ICD-10-CM | POA: Diagnosis not present

## 2017-07-25 DIAGNOSIS — N186 End stage renal disease: Secondary | ICD-10-CM | POA: Diagnosis not present

## 2017-07-25 DIAGNOSIS — L97212 Non-pressure chronic ulcer of right calf with fat layer exposed: Secondary | ICD-10-CM | POA: Diagnosis not present

## 2017-07-25 DIAGNOSIS — I890 Lymphedema, not elsewhere classified: Secondary | ICD-10-CM | POA: Diagnosis not present

## 2017-07-25 DIAGNOSIS — L97222 Non-pressure chronic ulcer of left calf with fat layer exposed: Secondary | ICD-10-CM | POA: Diagnosis not present

## 2017-07-25 DIAGNOSIS — I87313 Chronic venous hypertension (idiopathic) with ulcer of bilateral lower extremity: Secondary | ICD-10-CM | POA: Diagnosis not present

## 2017-07-25 DIAGNOSIS — R238 Other skin changes: Secondary | ICD-10-CM | POA: Diagnosis not present

## 2017-07-26 DIAGNOSIS — N2581 Secondary hyperparathyroidism of renal origin: Secondary | ICD-10-CM | POA: Diagnosis not present

## 2017-07-26 DIAGNOSIS — N186 End stage renal disease: Secondary | ICD-10-CM | POA: Diagnosis not present

## 2017-07-26 DIAGNOSIS — L299 Pruritus, unspecified: Secondary | ICD-10-CM | POA: Diagnosis not present

## 2017-07-28 DIAGNOSIS — L97212 Non-pressure chronic ulcer of right calf with fat layer exposed: Secondary | ICD-10-CM | POA: Diagnosis not present

## 2017-07-28 DIAGNOSIS — N186 End stage renal disease: Secondary | ICD-10-CM | POA: Diagnosis not present

## 2017-07-28 DIAGNOSIS — I87313 Chronic venous hypertension (idiopathic) with ulcer of bilateral lower extremity: Secondary | ICD-10-CM | POA: Diagnosis not present

## 2017-07-28 DIAGNOSIS — L97222 Non-pressure chronic ulcer of left calf with fat layer exposed: Secondary | ICD-10-CM | POA: Diagnosis not present

## 2017-07-28 DIAGNOSIS — E1122 Type 2 diabetes mellitus with diabetic chronic kidney disease: Secondary | ICD-10-CM | POA: Diagnosis not present

## 2017-07-28 DIAGNOSIS — Z992 Dependence on renal dialysis: Secondary | ICD-10-CM | POA: Diagnosis not present

## 2017-07-28 DIAGNOSIS — R238 Other skin changes: Secondary | ICD-10-CM | POA: Diagnosis not present

## 2017-07-28 DIAGNOSIS — I890 Lymphedema, not elsewhere classified: Secondary | ICD-10-CM | POA: Diagnosis not present

## 2017-07-29 DIAGNOSIS — L299 Pruritus, unspecified: Secondary | ICD-10-CM | POA: Diagnosis not present

## 2017-07-29 DIAGNOSIS — N186 End stage renal disease: Secondary | ICD-10-CM | POA: Diagnosis not present

## 2017-07-29 DIAGNOSIS — N2581 Secondary hyperparathyroidism of renal origin: Secondary | ICD-10-CM | POA: Diagnosis not present

## 2017-07-30 DIAGNOSIS — I120 Hypertensive chronic kidney disease with stage 5 chronic kidney disease or end stage renal disease: Secondary | ICD-10-CM | POA: Diagnosis not present

## 2017-07-30 DIAGNOSIS — L97829 Non-pressure chronic ulcer of other part of left lower leg with unspecified severity: Secondary | ICD-10-CM | POA: Diagnosis not present

## 2017-07-30 DIAGNOSIS — E1122 Type 2 diabetes mellitus with diabetic chronic kidney disease: Secondary | ICD-10-CM | POA: Diagnosis not present

## 2017-07-30 DIAGNOSIS — N186 End stage renal disease: Secondary | ICD-10-CM | POA: Diagnosis not present

## 2017-07-30 DIAGNOSIS — E11621 Type 2 diabetes mellitus with foot ulcer: Secondary | ICD-10-CM | POA: Diagnosis not present

## 2017-07-30 DIAGNOSIS — L97819 Non-pressure chronic ulcer of other part of right lower leg with unspecified severity: Secondary | ICD-10-CM | POA: Diagnosis not present

## 2017-07-30 DIAGNOSIS — G4730 Sleep apnea, unspecified: Secondary | ICD-10-CM | POA: Diagnosis not present

## 2017-07-30 DIAGNOSIS — L97522 Non-pressure chronic ulcer of other part of left foot with fat layer exposed: Secondary | ICD-10-CM | POA: Diagnosis not present

## 2017-07-30 DIAGNOSIS — I87313 Chronic venous hypertension (idiopathic) with ulcer of bilateral lower extremity: Secondary | ICD-10-CM | POA: Diagnosis not present

## 2017-07-30 DIAGNOSIS — E11622 Type 2 diabetes mellitus with other skin ulcer: Secondary | ICD-10-CM | POA: Diagnosis not present

## 2017-07-31 DIAGNOSIS — L299 Pruritus, unspecified: Secondary | ICD-10-CM | POA: Diagnosis not present

## 2017-07-31 DIAGNOSIS — N2581 Secondary hyperparathyroidism of renal origin: Secondary | ICD-10-CM | POA: Diagnosis not present

## 2017-07-31 DIAGNOSIS — N186 End stage renal disease: Secondary | ICD-10-CM | POA: Diagnosis not present

## 2017-08-01 DIAGNOSIS — I87313 Chronic venous hypertension (idiopathic) with ulcer of bilateral lower extremity: Secondary | ICD-10-CM | POA: Diagnosis not present

## 2017-08-01 DIAGNOSIS — N186 End stage renal disease: Secondary | ICD-10-CM | POA: Diagnosis not present

## 2017-08-01 DIAGNOSIS — L97212 Non-pressure chronic ulcer of right calf with fat layer exposed: Secondary | ICD-10-CM | POA: Diagnosis not present

## 2017-08-01 DIAGNOSIS — I890 Lymphedema, not elsewhere classified: Secondary | ICD-10-CM | POA: Diagnosis not present

## 2017-08-01 DIAGNOSIS — L97222 Non-pressure chronic ulcer of left calf with fat layer exposed: Secondary | ICD-10-CM | POA: Diagnosis not present

## 2017-08-01 DIAGNOSIS — E1122 Type 2 diabetes mellitus with diabetic chronic kidney disease: Secondary | ICD-10-CM | POA: Diagnosis not present

## 2017-08-01 DIAGNOSIS — R238 Other skin changes: Secondary | ICD-10-CM | POA: Diagnosis not present

## 2017-08-01 DIAGNOSIS — Z992 Dependence on renal dialysis: Secondary | ICD-10-CM | POA: Diagnosis not present

## 2017-08-02 DIAGNOSIS — N186 End stage renal disease: Secondary | ICD-10-CM | POA: Diagnosis not present

## 2017-08-02 DIAGNOSIS — N2581 Secondary hyperparathyroidism of renal origin: Secondary | ICD-10-CM | POA: Diagnosis not present

## 2017-08-04 DIAGNOSIS — R238 Other skin changes: Secondary | ICD-10-CM | POA: Diagnosis not present

## 2017-08-04 DIAGNOSIS — E1122 Type 2 diabetes mellitus with diabetic chronic kidney disease: Secondary | ICD-10-CM | POA: Diagnosis not present

## 2017-08-04 DIAGNOSIS — I890 Lymphedema, not elsewhere classified: Secondary | ICD-10-CM | POA: Diagnosis not present

## 2017-08-04 DIAGNOSIS — Z992 Dependence on renal dialysis: Secondary | ICD-10-CM | POA: Diagnosis not present

## 2017-08-04 DIAGNOSIS — L97212 Non-pressure chronic ulcer of right calf with fat layer exposed: Secondary | ICD-10-CM | POA: Diagnosis not present

## 2017-08-04 DIAGNOSIS — I87313 Chronic venous hypertension (idiopathic) with ulcer of bilateral lower extremity: Secondary | ICD-10-CM | POA: Diagnosis not present

## 2017-08-04 DIAGNOSIS — N186 End stage renal disease: Secondary | ICD-10-CM | POA: Diagnosis not present

## 2017-08-04 DIAGNOSIS — L97222 Non-pressure chronic ulcer of left calf with fat layer exposed: Secondary | ICD-10-CM | POA: Diagnosis not present

## 2017-08-05 DIAGNOSIS — N2581 Secondary hyperparathyroidism of renal origin: Secondary | ICD-10-CM | POA: Diagnosis not present

## 2017-08-05 DIAGNOSIS — N186 End stage renal disease: Secondary | ICD-10-CM | POA: Diagnosis not present

## 2017-08-05 DIAGNOSIS — Z23 Encounter for immunization: Secondary | ICD-10-CM | POA: Diagnosis not present

## 2017-08-06 ENCOUNTER — Encounter (HOSPITAL_BASED_OUTPATIENT_CLINIC_OR_DEPARTMENT_OTHER): Payer: BLUE CROSS/BLUE SHIELD | Attending: Surgery

## 2017-08-06 DIAGNOSIS — I890 Lymphedema, not elsewhere classified: Secondary | ICD-10-CM | POA: Diagnosis not present

## 2017-08-06 DIAGNOSIS — N186 End stage renal disease: Secondary | ICD-10-CM | POA: Diagnosis not present

## 2017-08-06 DIAGNOSIS — Z992 Dependence on renal dialysis: Secondary | ICD-10-CM | POA: Insufficient documentation

## 2017-08-06 DIAGNOSIS — I87312 Chronic venous hypertension (idiopathic) with ulcer of left lower extremity: Secondary | ICD-10-CM | POA: Insufficient documentation

## 2017-08-06 DIAGNOSIS — L97522 Non-pressure chronic ulcer of other part of left foot with fat layer exposed: Secondary | ICD-10-CM | POA: Insufficient documentation

## 2017-08-06 DIAGNOSIS — I120 Hypertensive chronic kidney disease with stage 5 chronic kidney disease or end stage renal disease: Secondary | ICD-10-CM | POA: Diagnosis not present

## 2017-08-06 DIAGNOSIS — E11622 Type 2 diabetes mellitus with other skin ulcer: Secondary | ICD-10-CM | POA: Diagnosis not present

## 2017-08-06 DIAGNOSIS — E11621 Type 2 diabetes mellitus with foot ulcer: Secondary | ICD-10-CM | POA: Insufficient documentation

## 2017-08-06 DIAGNOSIS — G4730 Sleep apnea, unspecified: Secondary | ICD-10-CM | POA: Insufficient documentation

## 2017-08-06 DIAGNOSIS — E1122 Type 2 diabetes mellitus with diabetic chronic kidney disease: Secondary | ICD-10-CM | POA: Diagnosis not present

## 2017-08-07 DIAGNOSIS — N2581 Secondary hyperparathyroidism of renal origin: Secondary | ICD-10-CM | POA: Diagnosis not present

## 2017-08-07 DIAGNOSIS — N186 End stage renal disease: Secondary | ICD-10-CM | POA: Diagnosis not present

## 2017-08-07 DIAGNOSIS — Z23 Encounter for immunization: Secondary | ICD-10-CM | POA: Diagnosis not present

## 2017-08-08 DIAGNOSIS — L97212 Non-pressure chronic ulcer of right calf with fat layer exposed: Secondary | ICD-10-CM | POA: Diagnosis not present

## 2017-08-08 DIAGNOSIS — Z992 Dependence on renal dialysis: Secondary | ICD-10-CM | POA: Diagnosis not present

## 2017-08-08 DIAGNOSIS — R238 Other skin changes: Secondary | ICD-10-CM | POA: Diagnosis not present

## 2017-08-08 DIAGNOSIS — L97222 Non-pressure chronic ulcer of left calf with fat layer exposed: Secondary | ICD-10-CM | POA: Diagnosis not present

## 2017-08-08 DIAGNOSIS — E1122 Type 2 diabetes mellitus with diabetic chronic kidney disease: Secondary | ICD-10-CM | POA: Diagnosis not present

## 2017-08-08 DIAGNOSIS — I87313 Chronic venous hypertension (idiopathic) with ulcer of bilateral lower extremity: Secondary | ICD-10-CM | POA: Diagnosis not present

## 2017-08-08 DIAGNOSIS — I890 Lymphedema, not elsewhere classified: Secondary | ICD-10-CM | POA: Diagnosis not present

## 2017-08-08 DIAGNOSIS — N186 End stage renal disease: Secondary | ICD-10-CM | POA: Diagnosis not present

## 2017-08-09 DIAGNOSIS — Z23 Encounter for immunization: Secondary | ICD-10-CM | POA: Diagnosis not present

## 2017-08-09 DIAGNOSIS — N2581 Secondary hyperparathyroidism of renal origin: Secondary | ICD-10-CM | POA: Diagnosis not present

## 2017-08-09 DIAGNOSIS — N186 End stage renal disease: Secondary | ICD-10-CM | POA: Diagnosis not present

## 2017-08-11 DIAGNOSIS — L97212 Non-pressure chronic ulcer of right calf with fat layer exposed: Secondary | ICD-10-CM | POA: Diagnosis not present

## 2017-08-11 DIAGNOSIS — I87313 Chronic venous hypertension (idiopathic) with ulcer of bilateral lower extremity: Secondary | ICD-10-CM | POA: Diagnosis not present

## 2017-08-11 DIAGNOSIS — Z992 Dependence on renal dialysis: Secondary | ICD-10-CM | POA: Diagnosis not present

## 2017-08-11 DIAGNOSIS — I890 Lymphedema, not elsewhere classified: Secondary | ICD-10-CM | POA: Diagnosis not present

## 2017-08-11 DIAGNOSIS — N186 End stage renal disease: Secondary | ICD-10-CM | POA: Diagnosis not present

## 2017-08-11 DIAGNOSIS — R238 Other skin changes: Secondary | ICD-10-CM | POA: Diagnosis not present

## 2017-08-11 DIAGNOSIS — L97222 Non-pressure chronic ulcer of left calf with fat layer exposed: Secondary | ICD-10-CM | POA: Diagnosis not present

## 2017-08-11 DIAGNOSIS — E1122 Type 2 diabetes mellitus with diabetic chronic kidney disease: Secondary | ICD-10-CM | POA: Diagnosis not present

## 2017-08-12 DIAGNOSIS — N186 End stage renal disease: Secondary | ICD-10-CM | POA: Diagnosis not present

## 2017-08-12 DIAGNOSIS — N2581 Secondary hyperparathyroidism of renal origin: Secondary | ICD-10-CM | POA: Diagnosis not present

## 2017-08-12 DIAGNOSIS — L299 Pruritus, unspecified: Secondary | ICD-10-CM | POA: Diagnosis not present

## 2017-08-13 DIAGNOSIS — E1122 Type 2 diabetes mellitus with diabetic chronic kidney disease: Secondary | ICD-10-CM | POA: Diagnosis not present

## 2017-08-13 DIAGNOSIS — G4730 Sleep apnea, unspecified: Secondary | ICD-10-CM | POA: Diagnosis not present

## 2017-08-13 DIAGNOSIS — I87312 Chronic venous hypertension (idiopathic) with ulcer of left lower extremity: Secondary | ICD-10-CM | POA: Diagnosis not present

## 2017-08-13 DIAGNOSIS — N186 End stage renal disease: Secondary | ICD-10-CM | POA: Diagnosis not present

## 2017-08-13 DIAGNOSIS — I890 Lymphedema, not elsewhere classified: Secondary | ICD-10-CM | POA: Diagnosis not present

## 2017-08-13 DIAGNOSIS — Z992 Dependence on renal dialysis: Secondary | ICD-10-CM | POA: Diagnosis not present

## 2017-08-13 DIAGNOSIS — E11621 Type 2 diabetes mellitus with foot ulcer: Secondary | ICD-10-CM | POA: Diagnosis not present

## 2017-08-13 DIAGNOSIS — L97522 Non-pressure chronic ulcer of other part of left foot with fat layer exposed: Secondary | ICD-10-CM | POA: Diagnosis not present

## 2017-08-13 DIAGNOSIS — I120 Hypertensive chronic kidney disease with stage 5 chronic kidney disease or end stage renal disease: Secondary | ICD-10-CM | POA: Diagnosis not present

## 2017-08-14 DIAGNOSIS — N186 End stage renal disease: Secondary | ICD-10-CM | POA: Diagnosis not present

## 2017-08-14 DIAGNOSIS — N2581 Secondary hyperparathyroidism of renal origin: Secondary | ICD-10-CM | POA: Diagnosis not present

## 2017-08-14 DIAGNOSIS — L299 Pruritus, unspecified: Secondary | ICD-10-CM | POA: Diagnosis not present

## 2017-08-15 DIAGNOSIS — L97222 Non-pressure chronic ulcer of left calf with fat layer exposed: Secondary | ICD-10-CM | POA: Diagnosis not present

## 2017-08-15 DIAGNOSIS — I890 Lymphedema, not elsewhere classified: Secondary | ICD-10-CM | POA: Diagnosis not present

## 2017-08-15 DIAGNOSIS — E1122 Type 2 diabetes mellitus with diabetic chronic kidney disease: Secondary | ICD-10-CM | POA: Diagnosis not present

## 2017-08-15 DIAGNOSIS — Z992 Dependence on renal dialysis: Secondary | ICD-10-CM | POA: Diagnosis not present

## 2017-08-15 DIAGNOSIS — L97212 Non-pressure chronic ulcer of right calf with fat layer exposed: Secondary | ICD-10-CM | POA: Diagnosis not present

## 2017-08-15 DIAGNOSIS — I87313 Chronic venous hypertension (idiopathic) with ulcer of bilateral lower extremity: Secondary | ICD-10-CM | POA: Diagnosis not present

## 2017-08-15 DIAGNOSIS — R238 Other skin changes: Secondary | ICD-10-CM | POA: Diagnosis not present

## 2017-08-15 DIAGNOSIS — N186 End stage renal disease: Secondary | ICD-10-CM | POA: Diagnosis not present

## 2017-08-16 DIAGNOSIS — L299 Pruritus, unspecified: Secondary | ICD-10-CM | POA: Diagnosis not present

## 2017-08-16 DIAGNOSIS — N186 End stage renal disease: Secondary | ICD-10-CM | POA: Diagnosis not present

## 2017-08-16 DIAGNOSIS — N2581 Secondary hyperparathyroidism of renal origin: Secondary | ICD-10-CM | POA: Diagnosis not present

## 2017-08-18 DIAGNOSIS — E1122 Type 2 diabetes mellitus with diabetic chronic kidney disease: Secondary | ICD-10-CM | POA: Diagnosis not present

## 2017-08-18 DIAGNOSIS — I87313 Chronic venous hypertension (idiopathic) with ulcer of bilateral lower extremity: Secondary | ICD-10-CM | POA: Diagnosis not present

## 2017-08-18 DIAGNOSIS — L97222 Non-pressure chronic ulcer of left calf with fat layer exposed: Secondary | ICD-10-CM | POA: Diagnosis not present

## 2017-08-18 DIAGNOSIS — Z992 Dependence on renal dialysis: Secondary | ICD-10-CM | POA: Diagnosis not present

## 2017-08-18 DIAGNOSIS — I890 Lymphedema, not elsewhere classified: Secondary | ICD-10-CM | POA: Diagnosis not present

## 2017-08-18 DIAGNOSIS — R238 Other skin changes: Secondary | ICD-10-CM | POA: Diagnosis not present

## 2017-08-18 DIAGNOSIS — N186 End stage renal disease: Secondary | ICD-10-CM | POA: Diagnosis not present

## 2017-08-18 DIAGNOSIS — L97212 Non-pressure chronic ulcer of right calf with fat layer exposed: Secondary | ICD-10-CM | POA: Diagnosis not present

## 2017-08-19 DIAGNOSIS — N186 End stage renal disease: Secondary | ICD-10-CM | POA: Diagnosis not present

## 2017-08-19 DIAGNOSIS — N2581 Secondary hyperparathyroidism of renal origin: Secondary | ICD-10-CM | POA: Diagnosis not present

## 2017-08-20 DIAGNOSIS — E11621 Type 2 diabetes mellitus with foot ulcer: Secondary | ICD-10-CM | POA: Diagnosis not present

## 2017-08-20 DIAGNOSIS — I890 Lymphedema, not elsewhere classified: Secondary | ICD-10-CM | POA: Diagnosis not present

## 2017-08-20 DIAGNOSIS — I120 Hypertensive chronic kidney disease with stage 5 chronic kidney disease or end stage renal disease: Secondary | ICD-10-CM | POA: Diagnosis not present

## 2017-08-20 DIAGNOSIS — G4730 Sleep apnea, unspecified: Secondary | ICD-10-CM | POA: Diagnosis not present

## 2017-08-20 DIAGNOSIS — Z992 Dependence on renal dialysis: Secondary | ICD-10-CM | POA: Diagnosis not present

## 2017-08-20 DIAGNOSIS — I87312 Chronic venous hypertension (idiopathic) with ulcer of left lower extremity: Secondary | ICD-10-CM | POA: Diagnosis not present

## 2017-08-20 DIAGNOSIS — N186 End stage renal disease: Secondary | ICD-10-CM | POA: Diagnosis not present

## 2017-08-20 DIAGNOSIS — L97522 Non-pressure chronic ulcer of other part of left foot with fat layer exposed: Secondary | ICD-10-CM | POA: Diagnosis not present

## 2017-08-20 DIAGNOSIS — E1122 Type 2 diabetes mellitus with diabetic chronic kidney disease: Secondary | ICD-10-CM | POA: Diagnosis not present

## 2017-08-21 DIAGNOSIS — N186 End stage renal disease: Secondary | ICD-10-CM | POA: Diagnosis not present

## 2017-08-21 DIAGNOSIS — N2581 Secondary hyperparathyroidism of renal origin: Secondary | ICD-10-CM | POA: Diagnosis not present

## 2017-08-23 DIAGNOSIS — N186 End stage renal disease: Secondary | ICD-10-CM | POA: Diagnosis not present

## 2017-08-23 DIAGNOSIS — N2581 Secondary hyperparathyroidism of renal origin: Secondary | ICD-10-CM | POA: Diagnosis not present

## 2017-08-25 DIAGNOSIS — N2581 Secondary hyperparathyroidism of renal origin: Secondary | ICD-10-CM | POA: Diagnosis not present

## 2017-08-25 DIAGNOSIS — N186 End stage renal disease: Secondary | ICD-10-CM | POA: Diagnosis not present

## 2017-08-27 DIAGNOSIS — N186 End stage renal disease: Secondary | ICD-10-CM | POA: Diagnosis not present

## 2017-08-27 DIAGNOSIS — N2581 Secondary hyperparathyroidism of renal origin: Secondary | ICD-10-CM | POA: Diagnosis not present

## 2017-08-29 DIAGNOSIS — N186 End stage renal disease: Secondary | ICD-10-CM | POA: Diagnosis not present

## 2017-08-29 DIAGNOSIS — N2581 Secondary hyperparathyroidism of renal origin: Secondary | ICD-10-CM | POA: Diagnosis not present

## 2017-08-31 DIAGNOSIS — E1122 Type 2 diabetes mellitus with diabetic chronic kidney disease: Secondary | ICD-10-CM | POA: Diagnosis not present

## 2017-08-31 DIAGNOSIS — Z992 Dependence on renal dialysis: Secondary | ICD-10-CM | POA: Diagnosis not present

## 2017-08-31 DIAGNOSIS — N186 End stage renal disease: Secondary | ICD-10-CM | POA: Diagnosis not present

## 2017-09-01 DIAGNOSIS — N186 End stage renal disease: Secondary | ICD-10-CM | POA: Diagnosis not present

## 2017-09-01 DIAGNOSIS — N2581 Secondary hyperparathyroidism of renal origin: Secondary | ICD-10-CM | POA: Diagnosis not present

## 2017-09-03 DIAGNOSIS — N2581 Secondary hyperparathyroidism of renal origin: Secondary | ICD-10-CM | POA: Diagnosis not present

## 2017-09-03 DIAGNOSIS — N186 End stage renal disease: Secondary | ICD-10-CM | POA: Diagnosis not present

## 2017-09-05 DIAGNOSIS — N186 End stage renal disease: Secondary | ICD-10-CM | POA: Diagnosis not present

## 2017-09-05 DIAGNOSIS — N2581 Secondary hyperparathyroidism of renal origin: Secondary | ICD-10-CM | POA: Diagnosis not present

## 2017-09-08 DIAGNOSIS — N186 End stage renal disease: Secondary | ICD-10-CM | POA: Diagnosis not present

## 2017-09-08 DIAGNOSIS — N2581 Secondary hyperparathyroidism of renal origin: Secondary | ICD-10-CM | POA: Diagnosis not present

## 2017-09-10 DIAGNOSIS — Z23 Encounter for immunization: Secondary | ICD-10-CM | POA: Diagnosis not present

## 2017-09-10 DIAGNOSIS — N186 End stage renal disease: Secondary | ICD-10-CM | POA: Diagnosis not present

## 2017-09-10 DIAGNOSIS — N2581 Secondary hyperparathyroidism of renal origin: Secondary | ICD-10-CM | POA: Diagnosis not present

## 2017-09-12 DIAGNOSIS — N2581 Secondary hyperparathyroidism of renal origin: Secondary | ICD-10-CM | POA: Diagnosis not present

## 2017-09-12 DIAGNOSIS — N186 End stage renal disease: Secondary | ICD-10-CM | POA: Diagnosis not present

## 2017-09-15 DIAGNOSIS — N2581 Secondary hyperparathyroidism of renal origin: Secondary | ICD-10-CM | POA: Diagnosis not present

## 2017-09-15 DIAGNOSIS — N186 End stage renal disease: Secondary | ICD-10-CM | POA: Diagnosis not present

## 2017-09-17 DIAGNOSIS — N186 End stage renal disease: Secondary | ICD-10-CM | POA: Diagnosis not present

## 2017-09-17 DIAGNOSIS — N2581 Secondary hyperparathyroidism of renal origin: Secondary | ICD-10-CM | POA: Diagnosis not present

## 2017-09-19 DIAGNOSIS — N186 End stage renal disease: Secondary | ICD-10-CM | POA: Diagnosis not present

## 2017-09-19 DIAGNOSIS — N2581 Secondary hyperparathyroidism of renal origin: Secondary | ICD-10-CM | POA: Diagnosis not present

## 2017-09-22 DIAGNOSIS — L299 Pruritus, unspecified: Secondary | ICD-10-CM | POA: Diagnosis not present

## 2017-09-22 DIAGNOSIS — N186 End stage renal disease: Secondary | ICD-10-CM | POA: Diagnosis not present

## 2017-09-22 DIAGNOSIS — N2581 Secondary hyperparathyroidism of renal origin: Secondary | ICD-10-CM | POA: Diagnosis not present

## 2017-09-24 DIAGNOSIS — N2581 Secondary hyperparathyroidism of renal origin: Secondary | ICD-10-CM | POA: Diagnosis not present

## 2017-09-24 DIAGNOSIS — N186 End stage renal disease: Secondary | ICD-10-CM | POA: Diagnosis not present

## 2017-09-24 DIAGNOSIS — L299 Pruritus, unspecified: Secondary | ICD-10-CM | POA: Diagnosis not present

## 2017-09-26 DIAGNOSIS — N2581 Secondary hyperparathyroidism of renal origin: Secondary | ICD-10-CM | POA: Diagnosis not present

## 2017-09-26 DIAGNOSIS — L299 Pruritus, unspecified: Secondary | ICD-10-CM | POA: Diagnosis not present

## 2017-09-26 DIAGNOSIS — N186 End stage renal disease: Secondary | ICD-10-CM | POA: Diagnosis not present

## 2017-09-29 DIAGNOSIS — N186 End stage renal disease: Secondary | ICD-10-CM | POA: Diagnosis not present

## 2017-09-29 DIAGNOSIS — N2581 Secondary hyperparathyroidism of renal origin: Secondary | ICD-10-CM | POA: Diagnosis not present

## 2017-10-01 DIAGNOSIS — Z992 Dependence on renal dialysis: Secondary | ICD-10-CM | POA: Diagnosis not present

## 2017-10-01 DIAGNOSIS — N2581 Secondary hyperparathyroidism of renal origin: Secondary | ICD-10-CM | POA: Diagnosis not present

## 2017-10-01 DIAGNOSIS — N186 End stage renal disease: Secondary | ICD-10-CM | POA: Diagnosis not present

## 2017-10-01 DIAGNOSIS — E1122 Type 2 diabetes mellitus with diabetic chronic kidney disease: Secondary | ICD-10-CM | POA: Diagnosis not present

## 2017-10-03 DIAGNOSIS — N186 End stage renal disease: Secondary | ICD-10-CM | POA: Diagnosis not present

## 2017-10-03 DIAGNOSIS — N2581 Secondary hyperparathyroidism of renal origin: Secondary | ICD-10-CM | POA: Diagnosis not present

## 2017-10-06 DIAGNOSIS — N2581 Secondary hyperparathyroidism of renal origin: Secondary | ICD-10-CM | POA: Diagnosis not present

## 2017-10-06 DIAGNOSIS — N186 End stage renal disease: Secondary | ICD-10-CM | POA: Diagnosis not present

## 2017-10-08 DIAGNOSIS — N2581 Secondary hyperparathyroidism of renal origin: Secondary | ICD-10-CM | POA: Diagnosis not present

## 2017-10-08 DIAGNOSIS — N186 End stage renal disease: Secondary | ICD-10-CM | POA: Diagnosis not present

## 2017-10-10 DIAGNOSIS — N2581 Secondary hyperparathyroidism of renal origin: Secondary | ICD-10-CM | POA: Diagnosis not present

## 2017-10-10 DIAGNOSIS — N186 End stage renal disease: Secondary | ICD-10-CM | POA: Diagnosis not present

## 2017-10-13 DIAGNOSIS — N2581 Secondary hyperparathyroidism of renal origin: Secondary | ICD-10-CM | POA: Diagnosis not present

## 2017-10-13 DIAGNOSIS — N186 End stage renal disease: Secondary | ICD-10-CM | POA: Diagnosis not present

## 2017-10-15 DIAGNOSIS — N2581 Secondary hyperparathyroidism of renal origin: Secondary | ICD-10-CM | POA: Diagnosis not present

## 2017-10-15 DIAGNOSIS — N186 End stage renal disease: Secondary | ICD-10-CM | POA: Diagnosis not present

## 2017-10-17 DIAGNOSIS — N2581 Secondary hyperparathyroidism of renal origin: Secondary | ICD-10-CM | POA: Diagnosis not present

## 2017-10-17 DIAGNOSIS — N186 End stage renal disease: Secondary | ICD-10-CM | POA: Diagnosis not present

## 2017-10-19 DIAGNOSIS — N2581 Secondary hyperparathyroidism of renal origin: Secondary | ICD-10-CM | POA: Diagnosis not present

## 2017-10-19 DIAGNOSIS — N186 End stage renal disease: Secondary | ICD-10-CM | POA: Diagnosis not present

## 2017-10-21 DIAGNOSIS — N2581 Secondary hyperparathyroidism of renal origin: Secondary | ICD-10-CM | POA: Diagnosis not present

## 2017-10-21 DIAGNOSIS — N186 End stage renal disease: Secondary | ICD-10-CM | POA: Diagnosis not present

## 2017-10-22 ENCOUNTER — Ambulatory Visit (INDEPENDENT_AMBULATORY_CARE_PROVIDER_SITE_OTHER): Payer: BLUE CROSS/BLUE SHIELD | Admitting: Podiatry

## 2017-10-22 ENCOUNTER — Encounter: Payer: Self-pay | Admitting: Podiatry

## 2017-10-22 DIAGNOSIS — M79672 Pain in left foot: Secondary | ICD-10-CM

## 2017-10-22 DIAGNOSIS — M79671 Pain in right foot: Secondary | ICD-10-CM

## 2017-10-22 DIAGNOSIS — B351 Tinea unguium: Secondary | ICD-10-CM | POA: Diagnosis not present

## 2017-10-22 NOTE — Patient Instructions (Signed)
Seen for hypertrophic nails. All nails debrided. Return in 3 months or as needed.  

## 2017-10-22 NOTE — [place] Notes (Signed)
Subjective: 61 y.o. year old female patient presents complaining of painful nails. Patient requests toe nails trimmed.  Stated that she has lost 60 lbs since she was on Dialysis. Still using compression socks for leaky vein. Patient ambulate with aid of walker. All her ulcers on lower limb has healed off.  Objective: Dermatologic: Thick yellow dystrophic nails x 10. No open skin lesions noted. Vascular: Pedal pulses are all palpable. Orthopedic: No gross deformities noted. Neurologic: All epicritic and tactile sensations grossly intact.  Assessment: Dystrophic mycotic nails x 10. On dialysis.  Treatment: All mycotic nails debrided.  Return in 3 months or as needed.

## 2017-10-24 DIAGNOSIS — N186 End stage renal disease: Secondary | ICD-10-CM | POA: Diagnosis not present

## 2017-10-24 DIAGNOSIS — N2581 Secondary hyperparathyroidism of renal origin: Secondary | ICD-10-CM | POA: Diagnosis not present

## 2017-10-27 DIAGNOSIS — N186 End stage renal disease: Secondary | ICD-10-CM | POA: Diagnosis not present

## 2017-10-27 DIAGNOSIS — N2581 Secondary hyperparathyroidism of renal origin: Secondary | ICD-10-CM | POA: Diagnosis not present

## 2017-10-27 DIAGNOSIS — L299 Pruritus, unspecified: Secondary | ICD-10-CM | POA: Diagnosis not present

## 2017-10-29 DIAGNOSIS — L299 Pruritus, unspecified: Secondary | ICD-10-CM | POA: Diagnosis not present

## 2017-10-29 DIAGNOSIS — N2581 Secondary hyperparathyroidism of renal origin: Secondary | ICD-10-CM | POA: Diagnosis not present

## 2017-10-29 DIAGNOSIS — N186 End stage renal disease: Secondary | ICD-10-CM | POA: Diagnosis not present

## 2017-10-31 DIAGNOSIS — E1122 Type 2 diabetes mellitus with diabetic chronic kidney disease: Secondary | ICD-10-CM | POA: Diagnosis not present

## 2017-10-31 DIAGNOSIS — L299 Pruritus, unspecified: Secondary | ICD-10-CM | POA: Diagnosis not present

## 2017-10-31 DIAGNOSIS — Z992 Dependence on renal dialysis: Secondary | ICD-10-CM | POA: Diagnosis not present

## 2017-10-31 DIAGNOSIS — N186 End stage renal disease: Secondary | ICD-10-CM | POA: Diagnosis not present

## 2017-10-31 DIAGNOSIS — N2581 Secondary hyperparathyroidism of renal origin: Secondary | ICD-10-CM | POA: Diagnosis not present

## 2017-11-03 DIAGNOSIS — N186 End stage renal disease: Secondary | ICD-10-CM | POA: Diagnosis not present

## 2017-11-03 DIAGNOSIS — N2581 Secondary hyperparathyroidism of renal origin: Secondary | ICD-10-CM | POA: Diagnosis not present

## 2017-11-05 DIAGNOSIS — N186 End stage renal disease: Secondary | ICD-10-CM | POA: Diagnosis not present

## 2017-11-05 DIAGNOSIS — N2581 Secondary hyperparathyroidism of renal origin: Secondary | ICD-10-CM | POA: Diagnosis not present

## 2017-11-07 DIAGNOSIS — N2581 Secondary hyperparathyroidism of renal origin: Secondary | ICD-10-CM | POA: Diagnosis not present

## 2017-11-07 DIAGNOSIS — N186 End stage renal disease: Secondary | ICD-10-CM | POA: Diagnosis not present

## 2017-11-10 DIAGNOSIS — N2581 Secondary hyperparathyroidism of renal origin: Secondary | ICD-10-CM | POA: Diagnosis not present

## 2017-11-10 DIAGNOSIS — N186 End stage renal disease: Secondary | ICD-10-CM | POA: Diagnosis not present

## 2017-11-12 DIAGNOSIS — N186 End stage renal disease: Secondary | ICD-10-CM | POA: Diagnosis not present

## 2017-11-12 DIAGNOSIS — N2581 Secondary hyperparathyroidism of renal origin: Secondary | ICD-10-CM | POA: Diagnosis not present

## 2017-11-14 DIAGNOSIS — N2581 Secondary hyperparathyroidism of renal origin: Secondary | ICD-10-CM | POA: Diagnosis not present

## 2017-11-14 DIAGNOSIS — N186 End stage renal disease: Secondary | ICD-10-CM | POA: Diagnosis not present

## 2017-11-17 DIAGNOSIS — N2581 Secondary hyperparathyroidism of renal origin: Secondary | ICD-10-CM | POA: Diagnosis not present

## 2017-11-17 DIAGNOSIS — N186 End stage renal disease: Secondary | ICD-10-CM | POA: Diagnosis not present

## 2017-11-19 DIAGNOSIS — N186 End stage renal disease: Secondary | ICD-10-CM | POA: Diagnosis not present

## 2017-11-19 DIAGNOSIS — N2581 Secondary hyperparathyroidism of renal origin: Secondary | ICD-10-CM | POA: Diagnosis not present

## 2017-11-21 DIAGNOSIS — N2581 Secondary hyperparathyroidism of renal origin: Secondary | ICD-10-CM | POA: Diagnosis not present

## 2017-11-21 DIAGNOSIS — N186 End stage renal disease: Secondary | ICD-10-CM | POA: Diagnosis not present

## 2017-11-23 DIAGNOSIS — N186 End stage renal disease: Secondary | ICD-10-CM | POA: Diagnosis not present

## 2017-11-23 DIAGNOSIS — N2581 Secondary hyperparathyroidism of renal origin: Secondary | ICD-10-CM | POA: Diagnosis not present

## 2017-11-26 DIAGNOSIS — N2581 Secondary hyperparathyroidism of renal origin: Secondary | ICD-10-CM | POA: Diagnosis not present

## 2017-11-26 DIAGNOSIS — N186 End stage renal disease: Secondary | ICD-10-CM | POA: Diagnosis not present

## 2017-11-28 DIAGNOSIS — N2581 Secondary hyperparathyroidism of renal origin: Secondary | ICD-10-CM | POA: Diagnosis not present

## 2017-11-28 DIAGNOSIS — N186 End stage renal disease: Secondary | ICD-10-CM | POA: Diagnosis not present

## 2017-11-30 DIAGNOSIS — N186 End stage renal disease: Secondary | ICD-10-CM | POA: Diagnosis not present

## 2017-11-30 DIAGNOSIS — N2581 Secondary hyperparathyroidism of renal origin: Secondary | ICD-10-CM | POA: Diagnosis not present

## 2017-12-01 DIAGNOSIS — E1122 Type 2 diabetes mellitus with diabetic chronic kidney disease: Secondary | ICD-10-CM | POA: Diagnosis not present

## 2017-12-01 DIAGNOSIS — Z992 Dependence on renal dialysis: Secondary | ICD-10-CM | POA: Diagnosis not present

## 2017-12-01 DIAGNOSIS — N186 End stage renal disease: Secondary | ICD-10-CM | POA: Diagnosis not present

## 2017-12-02 DIAGNOSIS — I82409 Acute embolism and thrombosis of unspecified deep veins of unspecified lower extremity: Secondary | ICD-10-CM

## 2017-12-02 HISTORY — DX: Acute embolism and thrombosis of unspecified deep veins of unspecified lower extremity: I82.409

## 2017-12-03 DIAGNOSIS — N186 End stage renal disease: Secondary | ICD-10-CM | POA: Diagnosis not present

## 2017-12-03 DIAGNOSIS — L299 Pruritus, unspecified: Secondary | ICD-10-CM | POA: Diagnosis not present

## 2017-12-03 DIAGNOSIS — N2581 Secondary hyperparathyroidism of renal origin: Secondary | ICD-10-CM | POA: Diagnosis not present

## 2017-12-05 DIAGNOSIS — N2581 Secondary hyperparathyroidism of renal origin: Secondary | ICD-10-CM | POA: Diagnosis not present

## 2017-12-05 DIAGNOSIS — N186 End stage renal disease: Secondary | ICD-10-CM | POA: Diagnosis not present

## 2017-12-05 DIAGNOSIS — L299 Pruritus, unspecified: Secondary | ICD-10-CM | POA: Diagnosis not present

## 2017-12-08 DIAGNOSIS — N2581 Secondary hyperparathyroidism of renal origin: Secondary | ICD-10-CM | POA: Diagnosis not present

## 2017-12-08 DIAGNOSIS — N186 End stage renal disease: Secondary | ICD-10-CM | POA: Diagnosis not present

## 2017-12-10 DIAGNOSIS — N2581 Secondary hyperparathyroidism of renal origin: Secondary | ICD-10-CM | POA: Diagnosis not present

## 2017-12-10 DIAGNOSIS — N186 End stage renal disease: Secondary | ICD-10-CM | POA: Diagnosis not present

## 2017-12-12 DIAGNOSIS — N2581 Secondary hyperparathyroidism of renal origin: Secondary | ICD-10-CM | POA: Diagnosis not present

## 2017-12-12 DIAGNOSIS — N186 End stage renal disease: Secondary | ICD-10-CM | POA: Diagnosis not present

## 2017-12-15 DIAGNOSIS — N186 End stage renal disease: Secondary | ICD-10-CM | POA: Diagnosis not present

## 2017-12-15 DIAGNOSIS — N2581 Secondary hyperparathyroidism of renal origin: Secondary | ICD-10-CM | POA: Diagnosis not present

## 2017-12-15 DIAGNOSIS — R51 Headache: Secondary | ICD-10-CM | POA: Diagnosis not present

## 2017-12-17 DIAGNOSIS — N2581 Secondary hyperparathyroidism of renal origin: Secondary | ICD-10-CM | POA: Diagnosis not present

## 2017-12-17 DIAGNOSIS — R51 Headache: Secondary | ICD-10-CM | POA: Diagnosis not present

## 2017-12-17 DIAGNOSIS — N186 End stage renal disease: Secondary | ICD-10-CM | POA: Diagnosis not present

## 2017-12-19 DIAGNOSIS — R51 Headache: Secondary | ICD-10-CM | POA: Diagnosis not present

## 2017-12-19 DIAGNOSIS — N2581 Secondary hyperparathyroidism of renal origin: Secondary | ICD-10-CM | POA: Diagnosis not present

## 2017-12-19 DIAGNOSIS — N186 End stage renal disease: Secondary | ICD-10-CM | POA: Diagnosis not present

## 2017-12-22 DIAGNOSIS — N2581 Secondary hyperparathyroidism of renal origin: Secondary | ICD-10-CM | POA: Diagnosis not present

## 2017-12-22 DIAGNOSIS — N186 End stage renal disease: Secondary | ICD-10-CM | POA: Diagnosis not present

## 2017-12-24 DIAGNOSIS — N2581 Secondary hyperparathyroidism of renal origin: Secondary | ICD-10-CM | POA: Diagnosis not present

## 2017-12-24 DIAGNOSIS — N186 End stage renal disease: Secondary | ICD-10-CM | POA: Diagnosis not present

## 2017-12-26 DIAGNOSIS — N186 End stage renal disease: Secondary | ICD-10-CM | POA: Diagnosis not present

## 2017-12-26 DIAGNOSIS — N2581 Secondary hyperparathyroidism of renal origin: Secondary | ICD-10-CM | POA: Diagnosis not present

## 2017-12-29 DIAGNOSIS — N2581 Secondary hyperparathyroidism of renal origin: Secondary | ICD-10-CM | POA: Diagnosis not present

## 2017-12-29 DIAGNOSIS — N186 End stage renal disease: Secondary | ICD-10-CM | POA: Diagnosis not present

## 2017-12-31 DIAGNOSIS — N186 End stage renal disease: Secondary | ICD-10-CM | POA: Diagnosis not present

## 2017-12-31 DIAGNOSIS — N2581 Secondary hyperparathyroidism of renal origin: Secondary | ICD-10-CM | POA: Diagnosis not present

## 2018-01-01 DIAGNOSIS — Z992 Dependence on renal dialysis: Secondary | ICD-10-CM | POA: Diagnosis not present

## 2018-01-01 DIAGNOSIS — N186 End stage renal disease: Secondary | ICD-10-CM | POA: Diagnosis not present

## 2018-01-01 DIAGNOSIS — E1122 Type 2 diabetes mellitus with diabetic chronic kidney disease: Secondary | ICD-10-CM | POA: Diagnosis not present

## 2018-01-02 DIAGNOSIS — E1122 Type 2 diabetes mellitus with diabetic chronic kidney disease: Secondary | ICD-10-CM | POA: Diagnosis not present

## 2018-01-02 DIAGNOSIS — Z992 Dependence on renal dialysis: Secondary | ICD-10-CM | POA: Diagnosis not present

## 2018-01-02 DIAGNOSIS — N186 End stage renal disease: Secondary | ICD-10-CM | POA: Diagnosis not present

## 2018-01-02 DIAGNOSIS — N2581 Secondary hyperparathyroidism of renal origin: Secondary | ICD-10-CM | POA: Diagnosis not present

## 2018-01-05 DIAGNOSIS — N186 End stage renal disease: Secondary | ICD-10-CM | POA: Diagnosis not present

## 2018-01-05 DIAGNOSIS — N2581 Secondary hyperparathyroidism of renal origin: Secondary | ICD-10-CM | POA: Diagnosis not present

## 2018-01-06 DIAGNOSIS — N185 Chronic kidney disease, stage 5: Secondary | ICD-10-CM | POA: Diagnosis not present

## 2018-01-06 DIAGNOSIS — I129 Hypertensive chronic kidney disease with stage 1 through stage 4 chronic kidney disease, or unspecified chronic kidney disease: Secondary | ICD-10-CM | POA: Diagnosis not present

## 2018-01-06 DIAGNOSIS — E1121 Type 2 diabetes mellitus with diabetic nephropathy: Secondary | ICD-10-CM | POA: Diagnosis not present

## 2018-01-06 DIAGNOSIS — Z992 Dependence on renal dialysis: Secondary | ICD-10-CM | POA: Diagnosis not present

## 2018-01-07 DIAGNOSIS — N186 End stage renal disease: Secondary | ICD-10-CM | POA: Diagnosis not present

## 2018-01-07 DIAGNOSIS — N2581 Secondary hyperparathyroidism of renal origin: Secondary | ICD-10-CM | POA: Diagnosis not present

## 2018-01-09 DIAGNOSIS — N2581 Secondary hyperparathyroidism of renal origin: Secondary | ICD-10-CM | POA: Diagnosis not present

## 2018-01-09 DIAGNOSIS — N186 End stage renal disease: Secondary | ICD-10-CM | POA: Diagnosis not present

## 2018-01-12 DIAGNOSIS — N186 End stage renal disease: Secondary | ICD-10-CM | POA: Diagnosis not present

## 2018-01-12 DIAGNOSIS — N2581 Secondary hyperparathyroidism of renal origin: Secondary | ICD-10-CM | POA: Diagnosis not present

## 2018-01-12 DIAGNOSIS — L299 Pruritus, unspecified: Secondary | ICD-10-CM | POA: Diagnosis not present

## 2018-01-12 DIAGNOSIS — R51 Headache: Secondary | ICD-10-CM | POA: Diagnosis not present

## 2018-01-14 DIAGNOSIS — N186 End stage renal disease: Secondary | ICD-10-CM | POA: Diagnosis not present

## 2018-01-14 DIAGNOSIS — L299 Pruritus, unspecified: Secondary | ICD-10-CM | POA: Diagnosis not present

## 2018-01-14 DIAGNOSIS — R51 Headache: Secondary | ICD-10-CM | POA: Diagnosis not present

## 2018-01-14 DIAGNOSIS — N2581 Secondary hyperparathyroidism of renal origin: Secondary | ICD-10-CM | POA: Diagnosis not present

## 2018-01-16 DIAGNOSIS — N186 End stage renal disease: Secondary | ICD-10-CM | POA: Diagnosis not present

## 2018-01-16 DIAGNOSIS — R51 Headache: Secondary | ICD-10-CM | POA: Diagnosis not present

## 2018-01-16 DIAGNOSIS — L299 Pruritus, unspecified: Secondary | ICD-10-CM | POA: Diagnosis not present

## 2018-01-16 DIAGNOSIS — N2581 Secondary hyperparathyroidism of renal origin: Secondary | ICD-10-CM | POA: Diagnosis not present

## 2018-01-19 DIAGNOSIS — N186 End stage renal disease: Secondary | ICD-10-CM | POA: Diagnosis not present

## 2018-01-19 DIAGNOSIS — N2581 Secondary hyperparathyroidism of renal origin: Secondary | ICD-10-CM | POA: Diagnosis not present

## 2018-01-19 DIAGNOSIS — L299 Pruritus, unspecified: Secondary | ICD-10-CM | POA: Diagnosis not present

## 2018-01-21 DIAGNOSIS — N186 End stage renal disease: Secondary | ICD-10-CM | POA: Diagnosis not present

## 2018-01-21 DIAGNOSIS — N2581 Secondary hyperparathyroidism of renal origin: Secondary | ICD-10-CM | POA: Diagnosis not present

## 2018-01-21 DIAGNOSIS — L299 Pruritus, unspecified: Secondary | ICD-10-CM | POA: Diagnosis not present

## 2018-01-23 DIAGNOSIS — N186 End stage renal disease: Secondary | ICD-10-CM | POA: Diagnosis not present

## 2018-01-23 DIAGNOSIS — N2581 Secondary hyperparathyroidism of renal origin: Secondary | ICD-10-CM | POA: Diagnosis not present

## 2018-01-23 DIAGNOSIS — L299 Pruritus, unspecified: Secondary | ICD-10-CM | POA: Diagnosis not present

## 2018-01-26 DIAGNOSIS — N2581 Secondary hyperparathyroidism of renal origin: Secondary | ICD-10-CM | POA: Diagnosis not present

## 2018-01-26 DIAGNOSIS — N186 End stage renal disease: Secondary | ICD-10-CM | POA: Diagnosis not present

## 2018-01-28 DIAGNOSIS — N2581 Secondary hyperparathyroidism of renal origin: Secondary | ICD-10-CM | POA: Diagnosis not present

## 2018-01-28 DIAGNOSIS — N186 End stage renal disease: Secondary | ICD-10-CM | POA: Diagnosis not present

## 2018-01-30 DIAGNOSIS — N186 End stage renal disease: Secondary | ICD-10-CM | POA: Diagnosis not present

## 2018-01-30 DIAGNOSIS — N2581 Secondary hyperparathyroidism of renal origin: Secondary | ICD-10-CM | POA: Diagnosis not present

## 2018-02-02 DIAGNOSIS — N2581 Secondary hyperparathyroidism of renal origin: Secondary | ICD-10-CM | POA: Diagnosis not present

## 2018-02-02 DIAGNOSIS — N186 End stage renal disease: Secondary | ICD-10-CM | POA: Diagnosis not present

## 2018-02-04 DIAGNOSIS — N2581 Secondary hyperparathyroidism of renal origin: Secondary | ICD-10-CM | POA: Diagnosis not present

## 2018-02-04 DIAGNOSIS — N186 End stage renal disease: Secondary | ICD-10-CM | POA: Diagnosis not present

## 2018-02-06 DIAGNOSIS — N2581 Secondary hyperparathyroidism of renal origin: Secondary | ICD-10-CM | POA: Diagnosis not present

## 2018-02-06 DIAGNOSIS — N186 End stage renal disease: Secondary | ICD-10-CM | POA: Diagnosis not present

## 2018-02-09 DIAGNOSIS — N2581 Secondary hyperparathyroidism of renal origin: Secondary | ICD-10-CM | POA: Diagnosis not present

## 2018-02-09 DIAGNOSIS — N186 End stage renal disease: Secondary | ICD-10-CM | POA: Diagnosis not present

## 2018-02-11 DIAGNOSIS — N2581 Secondary hyperparathyroidism of renal origin: Secondary | ICD-10-CM | POA: Diagnosis not present

## 2018-02-11 DIAGNOSIS — N186 End stage renal disease: Secondary | ICD-10-CM | POA: Diagnosis not present

## 2018-02-13 DIAGNOSIS — N186 End stage renal disease: Secondary | ICD-10-CM | POA: Diagnosis not present

## 2018-02-13 DIAGNOSIS — N2581 Secondary hyperparathyroidism of renal origin: Secondary | ICD-10-CM | POA: Diagnosis not present

## 2018-02-16 DIAGNOSIS — L299 Pruritus, unspecified: Secondary | ICD-10-CM | POA: Diagnosis not present

## 2018-02-16 DIAGNOSIS — N2581 Secondary hyperparathyroidism of renal origin: Secondary | ICD-10-CM | POA: Diagnosis not present

## 2018-02-16 DIAGNOSIS — R52 Pain, unspecified: Secondary | ICD-10-CM | POA: Diagnosis not present

## 2018-02-16 DIAGNOSIS — N186 End stage renal disease: Secondary | ICD-10-CM | POA: Diagnosis not present

## 2018-02-18 DIAGNOSIS — L299 Pruritus, unspecified: Secondary | ICD-10-CM | POA: Diagnosis not present

## 2018-02-18 DIAGNOSIS — R52 Pain, unspecified: Secondary | ICD-10-CM | POA: Diagnosis not present

## 2018-02-18 DIAGNOSIS — N2581 Secondary hyperparathyroidism of renal origin: Secondary | ICD-10-CM | POA: Diagnosis not present

## 2018-02-18 DIAGNOSIS — N186 End stage renal disease: Secondary | ICD-10-CM | POA: Diagnosis not present

## 2018-02-19 ENCOUNTER — Encounter: Payer: Self-pay | Admitting: Podiatry

## 2018-02-19 ENCOUNTER — Ambulatory Visit: Payer: BLUE CROSS/BLUE SHIELD | Admitting: Podiatry

## 2018-02-19 DIAGNOSIS — M79672 Pain in left foot: Secondary | ICD-10-CM

## 2018-02-19 DIAGNOSIS — M79671 Pain in right foot: Secondary | ICD-10-CM | POA: Diagnosis not present

## 2018-02-19 DIAGNOSIS — B351 Tinea unguium: Secondary | ICD-10-CM | POA: Diagnosis not present

## 2018-02-19 NOTE — [place] Notes (Signed)
Subjective: 62 y.o. year old female patient presents using walker complaining of painful nails. Patient requests toe nails trimmed.  Patient is on Dialysis and continues to loose her weight.   Objective: Dermatologic: Thick yellow deformed nails x 10. Vascular: Pedal pulses are all palpable. No edema or erythema noted. Orthopedic: No gross deformities. Neurologic: All epicritic and tactile sensations grossly intact.  Assessment: Dystrophic mycotic nails x 10. Painful feet.  Treatment: All mycotic nails debrided.  Return in 3 months or as needed.

## 2018-02-19 NOTE — Patient Instructions (Signed)
Seen for hypertrophic nails. All nails debrided. Return in 3 months or as needed.  

## 2018-02-20 DIAGNOSIS — N2581 Secondary hyperparathyroidism of renal origin: Secondary | ICD-10-CM | POA: Diagnosis not present

## 2018-02-20 DIAGNOSIS — L299 Pruritus, unspecified: Secondary | ICD-10-CM | POA: Diagnosis not present

## 2018-02-20 DIAGNOSIS — N186 End stage renal disease: Secondary | ICD-10-CM | POA: Diagnosis not present

## 2018-02-20 DIAGNOSIS — R52 Pain, unspecified: Secondary | ICD-10-CM | POA: Diagnosis not present

## 2018-02-23 DIAGNOSIS — N2581 Secondary hyperparathyroidism of renal origin: Secondary | ICD-10-CM | POA: Diagnosis not present

## 2018-02-23 DIAGNOSIS — N186 End stage renal disease: Secondary | ICD-10-CM | POA: Diagnosis not present

## 2018-02-25 DIAGNOSIS — N2581 Secondary hyperparathyroidism of renal origin: Secondary | ICD-10-CM | POA: Diagnosis not present

## 2018-02-25 DIAGNOSIS — N186 End stage renal disease: Secondary | ICD-10-CM | POA: Diagnosis not present

## 2018-02-27 DIAGNOSIS — N2581 Secondary hyperparathyroidism of renal origin: Secondary | ICD-10-CM | POA: Diagnosis not present

## 2018-02-27 DIAGNOSIS — N186 End stage renal disease: Secondary | ICD-10-CM | POA: Diagnosis not present

## 2018-03-01 DIAGNOSIS — E1122 Type 2 diabetes mellitus with diabetic chronic kidney disease: Secondary | ICD-10-CM | POA: Diagnosis not present

## 2018-03-01 DIAGNOSIS — Z992 Dependence on renal dialysis: Secondary | ICD-10-CM | POA: Diagnosis not present

## 2018-03-01 DIAGNOSIS — N186 End stage renal disease: Secondary | ICD-10-CM | POA: Diagnosis not present

## 2018-03-02 DIAGNOSIS — N2581 Secondary hyperparathyroidism of renal origin: Secondary | ICD-10-CM | POA: Diagnosis not present

## 2018-03-02 DIAGNOSIS — N186 End stage renal disease: Secondary | ICD-10-CM | POA: Diagnosis not present

## 2018-03-04 DIAGNOSIS — N2581 Secondary hyperparathyroidism of renal origin: Secondary | ICD-10-CM | POA: Diagnosis not present

## 2018-03-04 DIAGNOSIS — N186 End stage renal disease: Secondary | ICD-10-CM | POA: Diagnosis not present

## 2018-03-06 DIAGNOSIS — N186 End stage renal disease: Secondary | ICD-10-CM | POA: Diagnosis not present

## 2018-03-06 DIAGNOSIS — N2581 Secondary hyperparathyroidism of renal origin: Secondary | ICD-10-CM | POA: Diagnosis not present

## 2018-03-09 DIAGNOSIS — N2581 Secondary hyperparathyroidism of renal origin: Secondary | ICD-10-CM | POA: Diagnosis not present

## 2018-03-09 DIAGNOSIS — N186 End stage renal disease: Secondary | ICD-10-CM | POA: Diagnosis not present

## 2018-03-11 DIAGNOSIS — N186 End stage renal disease: Secondary | ICD-10-CM | POA: Diagnosis not present

## 2018-03-11 DIAGNOSIS — N2581 Secondary hyperparathyroidism of renal origin: Secondary | ICD-10-CM | POA: Diagnosis not present

## 2018-03-13 DIAGNOSIS — N2581 Secondary hyperparathyroidism of renal origin: Secondary | ICD-10-CM | POA: Diagnosis not present

## 2018-03-13 DIAGNOSIS — N186 End stage renal disease: Secondary | ICD-10-CM | POA: Diagnosis not present

## 2018-03-16 DIAGNOSIS — N186 End stage renal disease: Secondary | ICD-10-CM | POA: Diagnosis not present

## 2018-03-16 DIAGNOSIS — N2581 Secondary hyperparathyroidism of renal origin: Secondary | ICD-10-CM | POA: Diagnosis not present

## 2018-03-18 DIAGNOSIS — N186 End stage renal disease: Secondary | ICD-10-CM | POA: Diagnosis not present

## 2018-03-18 DIAGNOSIS — N2581 Secondary hyperparathyroidism of renal origin: Secondary | ICD-10-CM | POA: Diagnosis not present

## 2018-03-20 DIAGNOSIS — N2581 Secondary hyperparathyroidism of renal origin: Secondary | ICD-10-CM | POA: Diagnosis not present

## 2018-03-20 DIAGNOSIS — N186 End stage renal disease: Secondary | ICD-10-CM | POA: Diagnosis not present

## 2018-03-23 DIAGNOSIS — N2581 Secondary hyperparathyroidism of renal origin: Secondary | ICD-10-CM | POA: Diagnosis not present

## 2018-03-23 DIAGNOSIS — N186 End stage renal disease: Secondary | ICD-10-CM | POA: Diagnosis not present

## 2018-03-25 DIAGNOSIS — N186 End stage renal disease: Secondary | ICD-10-CM | POA: Diagnosis not present

## 2018-03-25 DIAGNOSIS — N2581 Secondary hyperparathyroidism of renal origin: Secondary | ICD-10-CM | POA: Diagnosis not present

## 2018-03-26 DIAGNOSIS — R531 Weakness: Secondary | ICD-10-CM | POA: Diagnosis not present

## 2018-03-26 DIAGNOSIS — M6281 Muscle weakness (generalized): Secondary | ICD-10-CM | POA: Diagnosis not present

## 2018-03-27 DIAGNOSIS — N186 End stage renal disease: Secondary | ICD-10-CM | POA: Diagnosis not present

## 2018-03-27 DIAGNOSIS — N2581 Secondary hyperparathyroidism of renal origin: Secondary | ICD-10-CM | POA: Diagnosis not present

## 2018-03-30 DIAGNOSIS — L299 Pruritus, unspecified: Secondary | ICD-10-CM | POA: Diagnosis not present

## 2018-03-30 DIAGNOSIS — N186 End stage renal disease: Secondary | ICD-10-CM | POA: Diagnosis not present

## 2018-03-30 DIAGNOSIS — N2581 Secondary hyperparathyroidism of renal origin: Secondary | ICD-10-CM | POA: Diagnosis not present

## 2018-03-31 DIAGNOSIS — R531 Weakness: Secondary | ICD-10-CM | POA: Diagnosis not present

## 2018-03-31 DIAGNOSIS — Z992 Dependence on renal dialysis: Secondary | ICD-10-CM | POA: Diagnosis not present

## 2018-03-31 DIAGNOSIS — M6281 Muscle weakness (generalized): Secondary | ICD-10-CM | POA: Diagnosis not present

## 2018-03-31 DIAGNOSIS — E1122 Type 2 diabetes mellitus with diabetic chronic kidney disease: Secondary | ICD-10-CM | POA: Diagnosis not present

## 2018-03-31 DIAGNOSIS — N186 End stage renal disease: Secondary | ICD-10-CM | POA: Diagnosis not present

## 2018-04-01 DIAGNOSIS — L299 Pruritus, unspecified: Secondary | ICD-10-CM | POA: Diagnosis not present

## 2018-04-01 DIAGNOSIS — N2581 Secondary hyperparathyroidism of renal origin: Secondary | ICD-10-CM | POA: Diagnosis not present

## 2018-04-01 DIAGNOSIS — N186 End stage renal disease: Secondary | ICD-10-CM | POA: Diagnosis not present

## 2018-04-02 ENCOUNTER — Other Ambulatory Visit: Payer: Self-pay | Admitting: Family Medicine

## 2018-04-02 ENCOUNTER — Ambulatory Visit
Admission: RE | Admit: 2018-04-02 | Discharge: 2018-04-02 | Disposition: A | Discharge status: Home / Self Care | Payer: BLUE CROSS/BLUE SHIELD | Source: Ambulatory Visit | Attending: Family Medicine | Admitting: Family Medicine

## 2018-04-02 DIAGNOSIS — H9191 Unspecified hearing loss, right ear: Secondary | ICD-10-CM | POA: Diagnosis not present

## 2018-04-02 DIAGNOSIS — M25551 Pain in right hip: Secondary | ICD-10-CM | POA: Diagnosis not present

## 2018-04-02 DIAGNOSIS — R52 Pain, unspecified: Secondary | ICD-10-CM

## 2018-04-02 DIAGNOSIS — M160 Bilateral primary osteoarthritis of hip: Secondary | ICD-10-CM | POA: Diagnosis not present

## 2018-04-02 DIAGNOSIS — M2560 Stiffness of unspecified joint, not elsewhere classified: Secondary | ICD-10-CM

## 2018-04-02 DIAGNOSIS — R531 Weakness: Secondary | ICD-10-CM | POA: Diagnosis not present

## 2018-04-02 DIAGNOSIS — M6281 Muscle weakness (generalized): Secondary | ICD-10-CM | POA: Diagnosis not present

## 2018-04-03 DIAGNOSIS — L299 Pruritus, unspecified: Secondary | ICD-10-CM | POA: Diagnosis not present

## 2018-04-03 DIAGNOSIS — N2581 Secondary hyperparathyroidism of renal origin: Secondary | ICD-10-CM | POA: Diagnosis not present

## 2018-04-03 DIAGNOSIS — N186 End stage renal disease: Secondary | ICD-10-CM | POA: Diagnosis not present

## 2018-04-06 DIAGNOSIS — N186 End stage renal disease: Secondary | ICD-10-CM | POA: Diagnosis not present

## 2018-04-06 DIAGNOSIS — N2581 Secondary hyperparathyroidism of renal origin: Secondary | ICD-10-CM | POA: Diagnosis not present

## 2018-04-07 ENCOUNTER — Ambulatory Visit (INDEPENDENT_AMBULATORY_CARE_PROVIDER_SITE_OTHER): Payer: BLUE CROSS/BLUE SHIELD | Admitting: Orthopaedic Surgery

## 2018-04-07 DIAGNOSIS — M6281 Muscle weakness (generalized): Secondary | ICD-10-CM | POA: Diagnosis not present

## 2018-04-07 DIAGNOSIS — R531 Weakness: Secondary | ICD-10-CM | POA: Diagnosis not present

## 2018-04-08 DIAGNOSIS — N2581 Secondary hyperparathyroidism of renal origin: Secondary | ICD-10-CM | POA: Diagnosis not present

## 2018-04-08 DIAGNOSIS — N186 End stage renal disease: Secondary | ICD-10-CM | POA: Diagnosis not present

## 2018-04-09 DIAGNOSIS — R531 Weakness: Secondary | ICD-10-CM | POA: Diagnosis not present

## 2018-04-09 DIAGNOSIS — M6281 Muscle weakness (generalized): Secondary | ICD-10-CM | POA: Diagnosis not present

## 2018-04-10 DIAGNOSIS — N2581 Secondary hyperparathyroidism of renal origin: Secondary | ICD-10-CM | POA: Diagnosis not present

## 2018-04-10 DIAGNOSIS — N186 End stage renal disease: Secondary | ICD-10-CM | POA: Diagnosis not present

## 2018-04-13 DIAGNOSIS — R52 Pain, unspecified: Secondary | ICD-10-CM | POA: Diagnosis not present

## 2018-04-13 DIAGNOSIS — N186 End stage renal disease: Secondary | ICD-10-CM | POA: Diagnosis not present

## 2018-04-13 DIAGNOSIS — N2581 Secondary hyperparathyroidism of renal origin: Secondary | ICD-10-CM | POA: Diagnosis not present

## 2018-04-14 ENCOUNTER — Ambulatory Visit (INDEPENDENT_AMBULATORY_CARE_PROVIDER_SITE_OTHER): Payer: BLUE CROSS/BLUE SHIELD | Admitting: Orthopaedic Surgery

## 2018-04-14 ENCOUNTER — Encounter (INDEPENDENT_AMBULATORY_CARE_PROVIDER_SITE_OTHER): Payer: Self-pay | Admitting: Orthopaedic Surgery

## 2018-04-14 VITALS — Ht 63.75 in | Wt 224.5 lb

## 2018-04-14 DIAGNOSIS — E1122 Type 2 diabetes mellitus with diabetic chronic kidney disease: Secondary | ICD-10-CM | POA: Diagnosis not present

## 2018-04-14 DIAGNOSIS — M1611 Unilateral primary osteoarthritis, right hip: Secondary | ICD-10-CM

## 2018-04-14 DIAGNOSIS — M1612 Unilateral primary osteoarthritis, left hip: Secondary | ICD-10-CM | POA: Diagnosis not present

## 2018-04-14 NOTE — [place] Notes (Signed)
Office Visit Note   Patient: Brenda Arnold           Date of Birth: 13-Mar-1956           MRN: [id number] Visit Date: 04/14/2018              Requested by: Kelton Pillar, MD [address] [place], [place] [address] PCP: Kelton Pillar, MD   Assessment & Plan: Visit Diagnoses:  1. Primary osteoarthritis of right hip   2. Primary osteoarthritis of left hip     Plan: Impression is end-stage bilateral hip degenerative joint disease.  Patient not interested in cortisone injections at this time.  We will obtain hemoglobin A1c level to assess her control of diabetes.  We did discuss total hip replacement and the associated risks and benefits.  She understands she is at elevated risk due to her dialysis and her diabetes.  She will think about this more and let us know how she wants to proceed.  Follow-Up Instructions: Return if symptoms worsen or fail to improve.   Orders:  No orders of the defined types were placed in this encounter.  No orders of the defined types were placed in this encounter.     Procedures: No procedures performed   Clinical Data: No additional findings.   Subjective: Chief Complaint  Patient presents with  . Right Hip - Pain  . Left Hip - Pain    Brenda Arnold is a very pleasant 62 year old female comes in with bilateral hip pain since March.  She recently started dialysis at that time.She has well-controlled diabetes.  She denies any injuries.  She walks with a walker at baseline.  She has had significant pain in her hips.   Review of Systems  Constitutional: Negative.   HENT: Negative.   Eyes: Negative.   Respiratory: Negative.   Cardiovascular: Negative.   Endocrine: Negative.   Musculoskeletal: Negative.   Neurological: Negative.   Hematological: Negative.   Psychiatric/Behavioral: Negative.   All other systems reviewed and are negative.    Objective: Vital Signs: Ht 5' 3.75" (1.619 m)   Wt 224 lb 8 oz (101.8 kg)    BMI 38.84 kg/m   Physical Exam  Constitutional: She is oriented to person, place, and time. She appears well-developed and well-nourished.  HENT:  Head: Normocephalic and atraumatic.  Eyes: EOM are normal.  Neck: Neck supple.  Pulmonary/Chest: Effort normal.  Abdominal: Soft.  Neurological: She is alert and oriented to person, place, and time.  Skin: Skin is warm. Capillary refill takes less than 2 seconds.  Psychiatric: She has a normal mood and affect. Her behavior is normal. Judgment and thought content normal.  Nursing note and vitals reviewed.   Ortho Exam Bilateral hip exam shows positive FADIR, positive Stinchfield, pain with logroll.  Negative straight leg. Specialty Comments:  No specialty comments available.  Imaging: No results found.   PMFS History: Patient Active Problem List   Diagnosis Date Noted  . Varicose veins of bilateral lower extremities with other complications 29/93/7169  . Anticoagulation goal of INR 2 to 3 09/30/2014  . Pain in lower limb 05/02/2014  . Onychomycosis due to dermatophyte 04/06/2013  . Pain in joint, ankle and foot 04/06/2013  . Bradycardia 11/06/2012  . Left-sided epistaxis 11/05/2012  . DM (diabetes mellitus) ([place]) 11/05/2012  . OSA (obstructive sleep apnea) 11/05/2012  . Acute posterior epistaxis 11/05/2012  . CKD (chronic kidney disease) 11/05/2012  . End stage renal disease ([place]) 06/25/2012  .  DM 12/20/2009  . OBESITY 12/20/2009  . OBESITY-MORBID (>100') 12/20/2009  . Essential hypertension 12/20/2009  . Paroxysmal atrial fibrillation ([place]) 12/20/2009  . CHEST PAIN-UNSPECIFIED 12/20/2009  . ABNORMAL CV (STRESS) TEST 12/20/2009   Past Medical History:  Diagnosis Date  . Anemia   . Arthritis   . Asthma   . CKD (chronic kidney disease), stage IV ([place])   . Complication of anesthesia    difficulty with getting oxygen saturation up  . Diabetes mellitus   . Dyslipidemia   . Epistaxis 11/05/2012  . History of nuclear  stress test    Myoview 5/18: EF 68, no infarct or ischemia, low risk  . HTN (hypertension)   . Hyperlipidemia   . Hyperparathyroidism due to renal insufficiency ([place])   . Obesity    s/p panniculectomy  . Paroxysmal A-fib ([facility])    a. chronic coumadin;  b. 12/2009 Echo: EF 60-65%, Gr 1 DD.  Marland Kitchen PCOS (polycystic ovarian syndrome)   . Pneumonia   . Seasonal allergies   . Sleep apnea    a. not using CPAP, last study  >8 yrs  . Vitamin D deficiency     Family History  Problem Relation Age of Onset  . Lung cancer Father        died @ 60  . Hypertension Mother        alive @ 43  . Diabetes Brother   . Kidney disease Brother     Past Surgical History:  Procedure Laterality Date  . ABDOMINAL HYSTERECTOMY     with panniculctomy  . AV FISTULA PLACEMENT  09/02/2012   Procedure: ARTERIOVENOUS (AV) FISTULA CREATION;  Surgeon: Elam Dutch, MD;  Location[facility];  Service: Vascular;  Laterality: Left;  Creation of Left Radial-Cephalic Fistula   . BREAST SURGERY     Biopsy right breast  . COLONOSCOPY W/ BIOPSIES AND POLYPECTOMY    . DILATION AND CURETTAGE OF UTERUS    . hysterectomy (other)    . REVISON OF ARTERIOVENOUS FISTULA Left 04/27/2014   Procedure: REVISON OF LEFT RADIAL-CEPHALIC ARTERIOVENOUS FISTULA;  Surgeon: Mal Misty, MD;  Location: [facility];  Service: Vascular;  Laterality: Left;  . right knee surgery    . uvuloplasty     Social History   Occupational History  . Not on file  Tobacco Use  . Smoking status: Never Smoker  . Smokeless tobacco: Never Used  Substance and Sexual Activity  . Alcohol use: No    Alcohol/week: 0.0 oz  . Drug use: No  . Sexual activity: Not on file

## 2018-04-14 NOTE — Addendum Note (Signed)
Addended by: Precious Bard on: 04/14/2018 02:54 PM   Modules accepted: Orders

## 2018-04-15 DIAGNOSIS — N186 End stage renal disease: Secondary | ICD-10-CM | POA: Diagnosis not present

## 2018-04-15 DIAGNOSIS — R52 Pain, unspecified: Secondary | ICD-10-CM | POA: Diagnosis not present

## 2018-04-15 DIAGNOSIS — N2581 Secondary hyperparathyroidism of renal origin: Secondary | ICD-10-CM | POA: Diagnosis not present

## 2018-04-15 LAB — HEMOGLOBIN A1C
Hgb A1c MFr Bld: 4.7 %{Hb} (ref <5.7)
Mean Plasma Glucose: 88 (calc)
eAG (mmol/L): 4.9 (calc)

## 2018-04-15 LAB — TIQ-NTM

## 2018-04-17 DIAGNOSIS — R52 Pain, unspecified: Secondary | ICD-10-CM | POA: Diagnosis not present

## 2018-04-17 DIAGNOSIS — N2581 Secondary hyperparathyroidism of renal origin: Secondary | ICD-10-CM | POA: Diagnosis not present

## 2018-04-17 DIAGNOSIS — N186 End stage renal disease: Secondary | ICD-10-CM | POA: Diagnosis not present

## 2018-04-20 DIAGNOSIS — N2581 Secondary hyperparathyroidism of renal origin: Secondary | ICD-10-CM | POA: Diagnosis not present

## 2018-04-20 DIAGNOSIS — N186 End stage renal disease: Secondary | ICD-10-CM | POA: Diagnosis not present

## 2018-04-22 DIAGNOSIS — N2581 Secondary hyperparathyroidism of renal origin: Secondary | ICD-10-CM | POA: Diagnosis not present

## 2018-04-22 DIAGNOSIS — N186 End stage renal disease: Secondary | ICD-10-CM | POA: Diagnosis not present

## 2018-04-24 DIAGNOSIS — N2581 Secondary hyperparathyroidism of renal origin: Secondary | ICD-10-CM | POA: Diagnosis not present

## 2018-04-24 DIAGNOSIS — N186 End stage renal disease: Secondary | ICD-10-CM | POA: Diagnosis not present

## 2018-04-27 DIAGNOSIS — N2581 Secondary hyperparathyroidism of renal origin: Secondary | ICD-10-CM | POA: Diagnosis not present

## 2018-04-27 DIAGNOSIS — N186 End stage renal disease: Secondary | ICD-10-CM | POA: Diagnosis not present

## 2018-04-29 DIAGNOSIS — N186 End stage renal disease: Secondary | ICD-10-CM | POA: Diagnosis not present

## 2018-04-29 DIAGNOSIS — N2581 Secondary hyperparathyroidism of renal origin: Secondary | ICD-10-CM | POA: Diagnosis not present

## 2018-05-01 DIAGNOSIS — N186 End stage renal disease: Secondary | ICD-10-CM | POA: Diagnosis not present

## 2018-05-01 DIAGNOSIS — N2581 Secondary hyperparathyroidism of renal origin: Secondary | ICD-10-CM | POA: Diagnosis not present

## 2018-05-01 DIAGNOSIS — Z992 Dependence on renal dialysis: Secondary | ICD-10-CM | POA: Diagnosis not present

## 2018-05-01 DIAGNOSIS — E1122 Type 2 diabetes mellitus with diabetic chronic kidney disease: Secondary | ICD-10-CM | POA: Diagnosis not present

## 2018-05-04 DIAGNOSIS — N2581 Secondary hyperparathyroidism of renal origin: Secondary | ICD-10-CM | POA: Diagnosis not present

## 2018-05-04 DIAGNOSIS — N186 End stage renal disease: Secondary | ICD-10-CM | POA: Diagnosis not present

## 2018-05-06 DIAGNOSIS — N2581 Secondary hyperparathyroidism of renal origin: Secondary | ICD-10-CM | POA: Diagnosis not present

## 2018-05-06 DIAGNOSIS — N186 End stage renal disease: Secondary | ICD-10-CM | POA: Diagnosis not present

## 2018-05-07 DIAGNOSIS — I517 Cardiomegaly: Secondary | ICD-10-CM | POA: Diagnosis not present

## 2018-05-07 DIAGNOSIS — Z992 Dependence on renal dialysis: Secondary | ICD-10-CM | POA: Diagnosis not present

## 2018-05-07 DIAGNOSIS — M160 Bilateral primary osteoarthritis of hip: Secondary | ICD-10-CM | POA: Diagnosis not present

## 2018-05-07 DIAGNOSIS — N186 End stage renal disease: Secondary | ICD-10-CM | POA: Diagnosis not present

## 2018-05-07 DIAGNOSIS — Z01818 Encounter for other preprocedural examination: Secondary | ICD-10-CM | POA: Diagnosis not present

## 2018-05-07 DIAGNOSIS — Z8679 Personal history of other diseases of the circulatory system: Secondary | ICD-10-CM | POA: Diagnosis not present

## 2018-05-07 DIAGNOSIS — E1122 Type 2 diabetes mellitus with diabetic chronic kidney disease: Secondary | ICD-10-CM | POA: Diagnosis not present

## 2018-05-07 DIAGNOSIS — R918 Other nonspecific abnormal finding of lung field: Secondary | ICD-10-CM | POA: Diagnosis not present

## 2018-05-07 DIAGNOSIS — Z7682 Awaiting organ transplant status: Secondary | ICD-10-CM | POA: Diagnosis not present

## 2018-05-07 DIAGNOSIS — R9431 Abnormal electrocardiogram [ECG] [EKG]: Secondary | ICD-10-CM | POA: Diagnosis not present

## 2018-05-07 DIAGNOSIS — I120 Hypertensive chronic kidney disease with stage 5 chronic kidney disease or end stage renal disease: Secondary | ICD-10-CM | POA: Diagnosis not present

## 2018-05-08 DIAGNOSIS — N2581 Secondary hyperparathyroidism of renal origin: Secondary | ICD-10-CM | POA: Diagnosis not present

## 2018-05-08 DIAGNOSIS — N186 End stage renal disease: Secondary | ICD-10-CM | POA: Diagnosis not present

## 2018-05-11 DIAGNOSIS — N186 End stage renal disease: Secondary | ICD-10-CM | POA: Diagnosis not present

## 2018-05-11 DIAGNOSIS — N2581 Secondary hyperparathyroidism of renal origin: Secondary | ICD-10-CM | POA: Diagnosis not present

## 2018-05-11 DIAGNOSIS — R52 Pain, unspecified: Secondary | ICD-10-CM | POA: Diagnosis not present

## 2018-05-13 DIAGNOSIS — N2581 Secondary hyperparathyroidism of renal origin: Secondary | ICD-10-CM | POA: Diagnosis not present

## 2018-05-13 DIAGNOSIS — N186 End stage renal disease: Secondary | ICD-10-CM | POA: Diagnosis not present

## 2018-05-13 DIAGNOSIS — R52 Pain, unspecified: Secondary | ICD-10-CM | POA: Diagnosis not present

## 2018-05-15 DIAGNOSIS — N2581 Secondary hyperparathyroidism of renal origin: Secondary | ICD-10-CM | POA: Diagnosis not present

## 2018-05-15 DIAGNOSIS — R52 Pain, unspecified: Secondary | ICD-10-CM | POA: Diagnosis not present

## 2018-05-15 DIAGNOSIS — N186 End stage renal disease: Secondary | ICD-10-CM | POA: Diagnosis not present

## 2018-05-18 DIAGNOSIS — N186 End stage renal disease: Secondary | ICD-10-CM | POA: Diagnosis not present

## 2018-05-18 DIAGNOSIS — N2581 Secondary hyperparathyroidism of renal origin: Secondary | ICD-10-CM | POA: Diagnosis not present

## 2018-05-19 DIAGNOSIS — Z992 Dependence on renal dialysis: Secondary | ICD-10-CM | POA: Diagnosis not present

## 2018-05-19 DIAGNOSIS — Z4802 Encounter for removal of sutures: Secondary | ICD-10-CM | POA: Insufficient documentation

## 2018-05-19 DIAGNOSIS — N186 End stage renal disease: Secondary | ICD-10-CM | POA: Diagnosis not present

## 2018-05-19 DIAGNOSIS — T82898A Other specified complication of vascular prosthetic devices, implants and grafts, initial encounter: Secondary | ICD-10-CM | POA: Diagnosis not present

## 2018-05-20 DIAGNOSIS — N2581 Secondary hyperparathyroidism of renal origin: Secondary | ICD-10-CM | POA: Diagnosis not present

## 2018-05-20 DIAGNOSIS — N186 End stage renal disease: Secondary | ICD-10-CM | POA: Diagnosis not present

## 2018-05-22 DIAGNOSIS — N2581 Secondary hyperparathyroidism of renal origin: Secondary | ICD-10-CM | POA: Diagnosis not present

## 2018-05-22 DIAGNOSIS — N186 End stage renal disease: Secondary | ICD-10-CM | POA: Diagnosis not present

## 2018-05-25 DIAGNOSIS — N186 End stage renal disease: Secondary | ICD-10-CM | POA: Diagnosis not present

## 2018-05-25 DIAGNOSIS — N2581 Secondary hyperparathyroidism of renal origin: Secondary | ICD-10-CM | POA: Diagnosis not present

## 2018-05-26 ENCOUNTER — Encounter: Payer: Self-pay | Admitting: Podiatry

## 2018-05-26 ENCOUNTER — Ambulatory Visit (INDEPENDENT_AMBULATORY_CARE_PROVIDER_SITE_OTHER): Payer: BLUE CROSS/BLUE SHIELD | Admitting: Podiatry

## 2018-05-26 DIAGNOSIS — M79671 Pain in right foot: Secondary | ICD-10-CM

## 2018-05-26 DIAGNOSIS — M79672 Pain in left foot: Secondary | ICD-10-CM | POA: Diagnosis not present

## 2018-05-26 DIAGNOSIS — B351 Tinea unguium: Secondary | ICD-10-CM

## 2018-05-26 NOTE — [place] Notes (Signed)
Subjective: 62 y.o. year old female patient presents complaining of painful nails. Patient requests toe nails trimmed.  On Dialysis and still losing weight. Now about 220 lbs. Walking with aid of walker. Objective: Dermatologic: Thick yellow deformed nails x 10. Vascular: Pedal pulses are all palpable. Orthopedic: No gross deformities. Neurologic: All epicritic and tactile sensations grossly intact.  Assessment: Dystrophic mycotic nails x 10. Pain in both feet.  Treatment: All mycotic nails, corns, calluses debrided.  Return in 3 months or as needed.

## 2018-05-26 NOTE — Patient Instructions (Signed)
Seen for hypertrophic nails. All nails debrided. Return in 3 months or sooner if needed.  

## 2018-05-27 DIAGNOSIS — N2581 Secondary hyperparathyroidism of renal origin: Secondary | ICD-10-CM | POA: Diagnosis not present

## 2018-05-27 DIAGNOSIS — N186 End stage renal disease: Secondary | ICD-10-CM | POA: Diagnosis not present

## 2018-05-29 DIAGNOSIS — N2581 Secondary hyperparathyroidism of renal origin: Secondary | ICD-10-CM | POA: Diagnosis not present

## 2018-05-29 DIAGNOSIS — N186 End stage renal disease: Secondary | ICD-10-CM | POA: Diagnosis not present

## 2018-05-31 DIAGNOSIS — Z992 Dependence on renal dialysis: Secondary | ICD-10-CM | POA: Diagnosis not present

## 2018-05-31 DIAGNOSIS — N186 End stage renal disease: Secondary | ICD-10-CM | POA: Diagnosis not present

## 2018-05-31 DIAGNOSIS — E1122 Type 2 diabetes mellitus with diabetic chronic kidney disease: Secondary | ICD-10-CM | POA: Diagnosis not present

## 2018-06-01 DIAGNOSIS — N2581 Secondary hyperparathyroidism of renal origin: Secondary | ICD-10-CM | POA: Diagnosis not present

## 2018-06-01 DIAGNOSIS — N186 End stage renal disease: Secondary | ICD-10-CM | POA: Diagnosis not present

## 2018-06-03 DIAGNOSIS — N186 End stage renal disease: Secondary | ICD-10-CM | POA: Diagnosis not present

## 2018-06-03 DIAGNOSIS — N2581 Secondary hyperparathyroidism of renal origin: Secondary | ICD-10-CM | POA: Diagnosis not present

## 2018-06-05 DIAGNOSIS — N186 End stage renal disease: Secondary | ICD-10-CM | POA: Diagnosis not present

## 2018-06-05 DIAGNOSIS — N2581 Secondary hyperparathyroidism of renal origin: Secondary | ICD-10-CM | POA: Diagnosis not present

## 2018-06-08 DIAGNOSIS — N2581 Secondary hyperparathyroidism of renal origin: Secondary | ICD-10-CM | POA: Diagnosis not present

## 2018-06-08 DIAGNOSIS — N186 End stage renal disease: Secondary | ICD-10-CM | POA: Diagnosis not present

## 2018-06-10 DIAGNOSIS — N2581 Secondary hyperparathyroidism of renal origin: Secondary | ICD-10-CM | POA: Diagnosis not present

## 2018-06-10 DIAGNOSIS — N186 End stage renal disease: Secondary | ICD-10-CM | POA: Diagnosis not present

## 2018-06-12 DIAGNOSIS — N186 End stage renal disease: Secondary | ICD-10-CM | POA: Diagnosis not present

## 2018-06-12 DIAGNOSIS — N2581 Secondary hyperparathyroidism of renal origin: Secondary | ICD-10-CM | POA: Diagnosis not present

## 2018-06-15 DIAGNOSIS — N186 End stage renal disease: Secondary | ICD-10-CM | POA: Diagnosis not present

## 2018-06-15 DIAGNOSIS — N2581 Secondary hyperparathyroidism of renal origin: Secondary | ICD-10-CM | POA: Diagnosis not present

## 2018-06-17 DIAGNOSIS — N186 End stage renal disease: Secondary | ICD-10-CM | POA: Diagnosis not present

## 2018-06-17 DIAGNOSIS — N2581 Secondary hyperparathyroidism of renal origin: Secondary | ICD-10-CM | POA: Diagnosis not present

## 2018-06-19 DIAGNOSIS — N2581 Secondary hyperparathyroidism of renal origin: Secondary | ICD-10-CM | POA: Diagnosis not present

## 2018-06-19 DIAGNOSIS — N186 End stage renal disease: Secondary | ICD-10-CM | POA: Diagnosis not present

## 2018-06-22 DIAGNOSIS — N2581 Secondary hyperparathyroidism of renal origin: Secondary | ICD-10-CM | POA: Diagnosis not present

## 2018-06-22 DIAGNOSIS — R52 Pain, unspecified: Secondary | ICD-10-CM | POA: Diagnosis not present

## 2018-06-22 DIAGNOSIS — R51 Headache: Secondary | ICD-10-CM | POA: Diagnosis not present

## 2018-06-22 DIAGNOSIS — N186 End stage renal disease: Secondary | ICD-10-CM | POA: Diagnosis not present

## 2018-06-24 DIAGNOSIS — N2581 Secondary hyperparathyroidism of renal origin: Secondary | ICD-10-CM | POA: Diagnosis not present

## 2018-06-24 DIAGNOSIS — N186 End stage renal disease: Secondary | ICD-10-CM | POA: Diagnosis not present

## 2018-06-24 DIAGNOSIS — R51 Headache: Secondary | ICD-10-CM | POA: Diagnosis not present

## 2018-06-24 DIAGNOSIS — R52 Pain, unspecified: Secondary | ICD-10-CM | POA: Diagnosis not present

## 2018-06-26 DIAGNOSIS — R51 Headache: Secondary | ICD-10-CM | POA: Diagnosis not present

## 2018-06-26 DIAGNOSIS — N2581 Secondary hyperparathyroidism of renal origin: Secondary | ICD-10-CM | POA: Diagnosis not present

## 2018-06-26 DIAGNOSIS — N186 End stage renal disease: Secondary | ICD-10-CM | POA: Diagnosis not present

## 2018-06-26 DIAGNOSIS — R52 Pain, unspecified: Secondary | ICD-10-CM | POA: Diagnosis not present

## 2018-06-29 DIAGNOSIS — R52 Pain, unspecified: Secondary | ICD-10-CM | POA: Diagnosis not present

## 2018-06-29 DIAGNOSIS — N186 End stage renal disease: Secondary | ICD-10-CM | POA: Diagnosis not present

## 2018-06-29 DIAGNOSIS — N2581 Secondary hyperparathyroidism of renal origin: Secondary | ICD-10-CM | POA: Diagnosis not present

## 2018-06-30 ENCOUNTER — Telehealth (INDEPENDENT_AMBULATORY_CARE_PROVIDER_SITE_OTHER): Payer: Self-pay | Admitting: Orthopaedic Surgery

## 2018-06-30 NOTE — Telephone Encounter (Signed)
Yes she is a candidate.  Surgery will be a total hip replacement.  Recovery is 6-8 weeks.  Which side does she want to have done?

## 2018-06-30 NOTE — Telephone Encounter (Signed)
Patient called stating that she is ready to have the surgery that was discussed with Dr. Erlinda Hong.  She would like to know what procedure will be performed and the down time. She would like to know if she would qualify for the anterior procedure. CB#[phone]

## 2018-06-30 NOTE — Telephone Encounter (Signed)
Please advise, and write surgery order if not done already.

## 2018-07-01 DIAGNOSIS — N186 End stage renal disease: Secondary | ICD-10-CM | POA: Diagnosis not present

## 2018-07-01 DIAGNOSIS — N2581 Secondary hyperparathyroidism of renal origin: Secondary | ICD-10-CM | POA: Diagnosis not present

## 2018-07-01 DIAGNOSIS — E1122 Type 2 diabetes mellitus with diabetic chronic kidney disease: Secondary | ICD-10-CM | POA: Diagnosis not present

## 2018-07-01 DIAGNOSIS — R52 Pain, unspecified: Secondary | ICD-10-CM | POA: Diagnosis not present

## 2018-07-01 DIAGNOSIS — Z992 Dependence on renal dialysis: Secondary | ICD-10-CM | POA: Diagnosis not present

## 2018-07-01 NOTE — Telephone Encounter (Signed)
Right DA THA. Depuy actis.  Spinal, usual stuff.  Thanks.

## 2018-07-01 NOTE — Telephone Encounter (Signed)
I discussed with patient and she says the right hip is worsening, so she wants to proceed with right THA.  She still asks if it is the anterior approach, I advised her we would verify and let her know for sure.  She wants to have this done mid August if possible.

## 2018-07-01 NOTE — Telephone Encounter (Signed)
IC patient [phone] LMVM to call me to discuss.

## 2018-07-03 DIAGNOSIS — N2581 Secondary hyperparathyroidism of renal origin: Secondary | ICD-10-CM | POA: Diagnosis not present

## 2018-07-03 DIAGNOSIS — R51 Headache: Secondary | ICD-10-CM | POA: Diagnosis not present

## 2018-07-03 DIAGNOSIS — N186 End stage renal disease: Secondary | ICD-10-CM | POA: Diagnosis not present

## 2018-07-06 DIAGNOSIS — R52 Pain, unspecified: Secondary | ICD-10-CM | POA: Diagnosis not present

## 2018-07-06 DIAGNOSIS — L299 Pruritus, unspecified: Secondary | ICD-10-CM | POA: Diagnosis not present

## 2018-07-06 DIAGNOSIS — N186 End stage renal disease: Secondary | ICD-10-CM | POA: Diagnosis not present

## 2018-07-06 DIAGNOSIS — R51 Headache: Secondary | ICD-10-CM | POA: Diagnosis not present

## 2018-07-06 DIAGNOSIS — N2581 Secondary hyperparathyroidism of renal origin: Secondary | ICD-10-CM | POA: Diagnosis not present

## 2018-07-08 DIAGNOSIS — L299 Pruritus, unspecified: Secondary | ICD-10-CM | POA: Diagnosis not present

## 2018-07-08 DIAGNOSIS — N2581 Secondary hyperparathyroidism of renal origin: Secondary | ICD-10-CM | POA: Diagnosis not present

## 2018-07-08 DIAGNOSIS — R52 Pain, unspecified: Secondary | ICD-10-CM | POA: Diagnosis not present

## 2018-07-08 DIAGNOSIS — N186 End stage renal disease: Secondary | ICD-10-CM | POA: Diagnosis not present

## 2018-07-08 DIAGNOSIS — R51 Headache: Secondary | ICD-10-CM | POA: Diagnosis not present

## 2018-07-08 NOTE — Telephone Encounter (Signed)
I called patient and left voice mail for return call to schedule.  Her voice mail to me left phone number [phone].

## 2018-07-09 NOTE — Telephone Encounter (Signed)
I called patient and discussed surgery scheduling.  Made Brenda Arnold an appointment to come back and see Dr. Erlinda Hong so he can further address the procedure and answer questions for Brenda Arnold.

## 2018-07-10 DIAGNOSIS — R51 Headache: Secondary | ICD-10-CM | POA: Diagnosis not present

## 2018-07-10 DIAGNOSIS — R52 Pain, unspecified: Secondary | ICD-10-CM | POA: Diagnosis not present

## 2018-07-10 DIAGNOSIS — N2581 Secondary hyperparathyroidism of renal origin: Secondary | ICD-10-CM | POA: Diagnosis not present

## 2018-07-10 DIAGNOSIS — N186 End stage renal disease: Secondary | ICD-10-CM | POA: Diagnosis not present

## 2018-07-10 DIAGNOSIS — L299 Pruritus, unspecified: Secondary | ICD-10-CM | POA: Diagnosis not present

## 2018-07-13 DIAGNOSIS — N186 End stage renal disease: Secondary | ICD-10-CM | POA: Diagnosis not present

## 2018-07-13 DIAGNOSIS — N2581 Secondary hyperparathyroidism of renal origin: Secondary | ICD-10-CM | POA: Diagnosis not present

## 2018-07-15 DIAGNOSIS — N186 End stage renal disease: Secondary | ICD-10-CM | POA: Diagnosis not present

## 2018-07-15 DIAGNOSIS — N2581 Secondary hyperparathyroidism of renal origin: Secondary | ICD-10-CM | POA: Diagnosis not present

## 2018-07-16 ENCOUNTER — Telehealth: Payer: Self-pay | Admitting: Physician Assistant

## 2018-07-16 DIAGNOSIS — N185 Chronic kidney disease, stage 5: Secondary | ICD-10-CM | POA: Diagnosis not present

## 2018-07-16 DIAGNOSIS — Z0001 Encounter for general adult medical examination with abnormal findings: Secondary | ICD-10-CM | POA: Diagnosis not present

## 2018-07-16 DIAGNOSIS — I129 Hypertensive chronic kidney disease with stage 1 through stage 4 chronic kidney disease, or unspecified chronic kidney disease: Secondary | ICD-10-CM | POA: Diagnosis not present

## 2018-07-16 DIAGNOSIS — Z992 Dependence on renal dialysis: Secondary | ICD-10-CM | POA: Diagnosis not present

## 2018-07-16 DIAGNOSIS — E1121 Type 2 diabetes mellitus with diabetic nephropathy: Secondary | ICD-10-CM | POA: Diagnosis not present

## 2018-07-16 NOTE — Telephone Encounter (Signed)
New message     [facility] Pre-operative Risk Assessment    Request for surgical clearance:  1. What type of surgery is being performed? Hip surgery  2. When is this surgery scheduled? TBD  3. What type of clearance is required (medical clearance vs. Pharmacy clearance to hold med vs. Both)? Both   4. Are there any medications that need to be held prior to surgery and how long?Per Beverlee Nims it would be  up to Cardiologist Office to determine  5. Practice name and name of physician performing surgery?[facility], Dr. Georga Kaufmann XU  6. What is your office phone number [phone]   7.   What is your office fax number [phone]  8.   Anesthesia type (None, local, MAC, general) ? Don't know    Brenda Arnold 07/16/2018, 10:24 AM  _________________________________________________________________   (provider comments below)

## 2018-07-16 NOTE — Telephone Encounter (Signed)
Spoke with pt. preop clearance appt scheduled with Doreene Adas, PA on 07/24/18 @ 11:30am

## 2018-07-16 NOTE — Telephone Encounter (Signed)
Called pt on both telephone numbers listed.  lmtcb x2.  Pt needs to schedule a pre-op cardiac clearance appt.

## 2018-07-16 NOTE — Telephone Encounter (Signed)
   Primary Cardiologist:Traci Turner, MD  Chart reviewed as part of pre-operative protocol coverage. Because of Brenda Arnold's past medical history and time since last visit, he/she will require a follow-up visit in order to better assess preoperative cardiovascular risk.  Pre-op covering staff: - Please schedule appointment and call patient to inform them. - Please contact requesting surgeon's office via preferred method (i.e, phone, fax) to inform them of need for appointment prior to surgery.  Tami Lin Duke, PA  07/16/2018, 3:49 PM

## 2018-07-17 DIAGNOSIS — N2581 Secondary hyperparathyroidism of renal origin: Secondary | ICD-10-CM | POA: Diagnosis not present

## 2018-07-17 DIAGNOSIS — N186 End stage renal disease: Secondary | ICD-10-CM | POA: Diagnosis not present

## 2018-07-20 DIAGNOSIS — N2581 Secondary hyperparathyroidism of renal origin: Secondary | ICD-10-CM | POA: Diagnosis not present

## 2018-07-20 DIAGNOSIS — N186 End stage renal disease: Secondary | ICD-10-CM | POA: Diagnosis not present

## 2018-07-21 ENCOUNTER — Encounter (INDEPENDENT_AMBULATORY_CARE_PROVIDER_SITE_OTHER): Payer: Self-pay | Admitting: Orthopaedic Surgery

## 2018-07-21 ENCOUNTER — Ambulatory Visit (INDEPENDENT_AMBULATORY_CARE_PROVIDER_SITE_OTHER): Payer: BLUE CROSS/BLUE SHIELD | Admitting: Orthopaedic Surgery

## 2018-07-21 DIAGNOSIS — M1611 Unilateral primary osteoarthritis, right hip: Secondary | ICD-10-CM | POA: Diagnosis not present

## 2018-07-21 NOTE — Progress Notes (Signed)
Office Visit Note   Patient: Brenda Arnold           Date of Birth: 1956-04-27           MRN: [id number] Visit Date: 07/21/2018              Requested by: Kelton Pillar, MD [place] [address] [employer] [address] [place], [place] [address] PCP: Kelton Pillar, MD   Assessment & Plan: Visit Diagnoses:  1. Primary osteoarthritis of right hip     Plan:  Impression is 1.  End-stage right hip degenerative joint disease 2.  End-stage renal disease on hemodialysis Monday Wednesday Friday 3.  Atrial fibrillation not on anticoagulation. 4.  Well-controlled diabetes hemoglobin A1c 4.7  At this point I would like patient to obtain a pre-albumin level to assess her ability to recover from the surgery.  In the meantime she needs to obtain clearance from her cardiologist and nephrologist.  We had a lengthy discussion regarding the risks and benefits of surgery and the postoperative rehab and recovery.  She does understand that she is as somewhat increased risk for infection related to her end-stage renal disease and dialysis. Total face to face encounter time was greater than 25 minutes and over half of this time was spent in counseling and/or coordination of care.  Follow-Up Instructions: Return if symptoms worsen or fail to improve.   Orders:  No orders of the defined types were placed in this encounter.  No orders of the defined types were placed in this encounter.     Procedures: No procedures performed   Clinical Data: No additional findings.   Subjective: Chief Complaint  Patient presents with  . Right Hip - Pain    [name] is a very pleasant 62 year old female comes in with pain.  She has known end-stage degenerative joint disease.  She is on dialysis for kidney failure and she has a history of atrial fibrillation not on anticoagulation.  She has well-controlled diabetes with hemoglobin A1c of 4.7.  She is status post panniculectomy in 2009.   Review of Systems    Constitutional: Negative.   HENT: Negative.   Eyes: Negative.   Respiratory: Negative.   Cardiovascular: Negative.   Endocrine: Negative.   Musculoskeletal: Negative.   Neurological: Negative.   Hematological: Negative.   Psychiatric/Behavioral: Negative.   All other systems reviewed and are negative.    Objective: Vital Signs: There were no vitals taken for this visit.  Physical Exam  Constitutional: She is oriented to person, place, and time. She appears well-developed and well-nourished.  Pulmonary/Chest: Effort normal.  Neurological: She is alert and oriented to person, place, and time.  Skin: Skin is warm. Capillary refill takes less than 2 seconds.  Psychiatric: She has a normal mood and affect. Her behavior is normal. Judgment and thought content normal.  Nursing note and vitals reviewed.   Ortho Exam Right hip exam shows normal soft tissue envelope.  The pannus does not draped over the anterior thigh.  She has limited range of motion secondary to pain Specialty Comments:  No specialty comments available.  Imaging: No results found.   PMFS History: Patient Active Problem List   Diagnosis Date Noted  . Varicose veins of bilateral lower extremities with other complications 22/01/5426  . Anticoagulation goal of INR 2 to 3 09/30/2014  . Pain in lower limb 05/02/2014  . Onychomycosis due to dermatophyte 04/06/2013  . Pain in joint, ankle and foot 04/06/2013  . Bradycardia 11/06/2012  . Left-sided  epistaxis 11/05/2012  . DM (diabetes mellitus) ([place]) 11/05/2012  . OSA (obstructive sleep apnea) 11/05/2012  . Acute posterior epistaxis 11/05/2012  . CKD (chronic kidney disease) 11/05/2012  . End stage renal disease ([place]) 06/25/2012  . DM 12/20/2009  . OBESITY 12/20/2009  . OBESITY-MORBID (>100') 12/20/2009  . Essential hypertension 12/20/2009  . Paroxysmal atrial fibrillation ([place]) 12/20/2009  . CHEST PAIN-UNSPECIFIED 12/20/2009  . ABNORMAL CV (STRESS) TEST  12/20/2009   Past Medical History:  Diagnosis Date  . Anemia   . Arthritis   . Asthma   . CKD (chronic kidney disease), stage IV ([place])   . Complication of anesthesia    difficulty with getting oxygen saturation up  . Diabetes mellitus   . Dyslipidemia   . Epistaxis 11/05/2012  . History of nuclear stress test    Myoview 5/18: EF 68, no infarct or ischemia, low risk  . HTN (hypertension)   . Hyperlipidemia   . Hyperparathyroidism due to renal insufficiency ([place])   . Obesity    s/p panniculectomy  . Paroxysmal A-fib (HCC)    a. chronic coumadin;  b. 12/2009 Echo: EF 60-65%, Gr 1 DD.  Marland Kitchen PCOS (polycystic ovarian syndrome)   . Pneumonia   . Seasonal allergies   . Sleep apnea    a. not using CPAP, last study  >8 yrs  . Vitamin D deficiency     Family History  Problem Relation Age of Onset  . Lung cancer Father        died @ 81  . Hypertension Mother        alive @ 19  . Diabetes Brother   . Kidney disease Brother     Past Surgical History:  Procedure Laterality Date  . ABDOMINAL HYSTERECTOMY     with panniculctomy  . AV FISTULA PLACEMENT  09/02/2012   Procedure: ARTERIOVENOUS (AV) FISTULA CREATION;  Surgeon: Elam Dutch, MD;  Location[facility];  Service: Vascular;  Laterality: Left;  Creation of Left Radial-Cephalic Fistula   . BREAST SURGERY     Biopsy right breast  . COLONOSCOPY W/ BIOPSIES AND POLYPECTOMY    . DILATION AND CURETTAGE OF UTERUS    . hysterectomy (other)    . REVISON OF ARTERIOVENOUS FISTULA Left 04/27/2014   Procedure: REVISON OF LEFT RADIAL-CEPHALIC ARTERIOVENOUS FISTULA;  Surgeon: Mal Misty, MD;  Location: [facility];  Service: Vascular;  Laterality: Left;  . right knee surgery    . uvuloplasty     Social History   Occupational History  . Not on file  Tobacco Use  . Smoking status: Never Smoker  . Smokeless tobacco: Never Used  Substance and Sexual Activity  . Alcohol use: No    Alcohol/week: 0.0 standard drinks  . Drug use: No  . Sexual  activity: Not on file

## 2018-07-21 NOTE — Addendum Note (Signed)
Addended by: Precious Bard on: 07/21/2018 04:34 PM   Modules accepted: Orders

## 2018-07-22 DIAGNOSIS — N186 End stage renal disease: Secondary | ICD-10-CM | POA: Diagnosis not present

## 2018-07-22 DIAGNOSIS — N2581 Secondary hyperparathyroidism of renal origin: Secondary | ICD-10-CM | POA: Diagnosis not present

## 2018-07-22 LAB — PREALBUMIN: Prealbumin: 27 mg/dL (ref 17–34)

## 2018-07-22 LAB — HEMOGLOBIN A1C
Hgb A1c MFr Bld: 4.9 % of total Hgb (ref <5.7)
Mean Plasma Glucose: 94 (calc)
eAG (mmol/L): 5.2 (calc)

## 2018-07-23 NOTE — Progress Notes (Signed)
Cardiology Office Note:    Date:  07/24/2018   ID:  Brenda Arnold, DOB 01-28-1956, MRN [id number]  PCP:  Kelton Pillar, MD  Cardiologist:  Fransico Him, MD   Referring MD: Kelton Pillar, MD   Chief Complaint  Patient presents with  . Medical Clearance    hip surgery     History of Present Illness:    Brenda Arnold is a 62 y.o. female with a hx of hypertension, paroxysmal atrial fibrillation, end-stage renal disease on HD, and obesity.  She was last seen in clinic by Richardson Dopp, PA-C on 02/12/2017.  She had a Myoview secondary to chest pain in the setting of atrial fibrillation 2011 that showed inferolateral lateral ischemia.  Heart catheterization was deferred and medical therapy was recommended.  During her last clinic visit, she complained of severe dyspnea on exertion over the past 6 to 12 months.  Given her symptoms and risk factors for coronary artery disease including diabetes, hyperlipidemia, and hypertension, she underwent Myoview stress test on 04/17/17 which showed no reversible ischemia.  She has not followed up with cardiology since that visit.  She presents today for preoperative clearance for hip surgery.  She states that she has struggled with mild hypotension with dialysis.  It was suggested that her Tylenol arthritis that was taken prior to dialysis sessions may have been contributing.  She has been taken off of her 100 mg diltiazem and 100 mg tablet of losartan.  She is currently taking no cardiac medications.  She is not on Coumadin.  She is doing well from a cardiac perspective.  She denies chest pain, shortness of breath, palpitations, lower extremity swelling, and syncope.  She cannot complete 4.0 METS because of her bilateral hip pain.  She denies all symptoms of angina.  Past Medical History:  Diagnosis Date  . Anemia   . Arthritis   . Asthma   . CKD (chronic kidney disease), stage IV ([place])   . Complication of anesthesia    difficulty with getting  oxygen saturation up  . Diabetes mellitus   . Dyslipidemia   . Epistaxis 11/05/2012  . History of nuclear stress test    Myoview 5/18: EF 68, no infarct or ischemia, low risk  . HTN (hypertension)   . Hyperlipidemia   . Hyperparathyroidism due to renal insufficiency ([place])   . Obesity    s/p panniculectomy  . Paroxysmal A-fib (HCC)    a. chronic coumadin;  b. 12/2009 Echo: EF 60-65%, Gr 1 DD.  Marland Kitchen PCOS (polycystic ovarian syndrome)   . Pneumonia   . Seasonal allergies   . Sleep apnea    a. not using CPAP, last study  >8 yrs  . Vitamin D deficiency     Past Surgical History:  Procedure Laterality Date  . ABDOMINAL HYSTERECTOMY     with panniculctomy  . AV FISTULA PLACEMENT  09/02/2012   Procedure: ARTERIOVENOUS (AV) FISTULA CREATION;  Surgeon: Elam Dutch, MD;  [facility];  Service: Vascular;  Laterality: Left;  Creation of Left Radial-Cephalic Fistula   . BREAST SURGERY     Biopsy right breast  . COLONOSCOPY W/ BIOPSIES AND POLYPECTOMY    . DILATION AND CURETTAGE OF UTERUS    . hysterectomy (other)    . REVISON OF ARTERIOVENOUS FISTULA Left 04/27/2014   Procedure: REVISON OF LEFT RADIAL-CEPHALIC ARTERIOVENOUS FISTULA;  Surgeon: Mal Misty, MD;  Location: [place];  Service: Vascular;  Laterality: Left;  . right knee surgery    .  uvuloplasty      Current Medications: Current Meds  Medication Sig  . B Complex-C-Folic Acid (DIALYVITE TABLET) TABS Take 1 tablet by mouth daily.  Marland Kitchen ethyl chloride spray USE AS DIRECTED (MIST SPRAY ONLY)  . gabapentin (NEURONTIN) 100 MG capsule Take 100 mg by mouth at bedtime.  Marland Kitchen HYDROcodone-acetaminophen (NORCO/VICODIN) 5-325 MG tablet TAKE 1 TABLET BY MOUTH AS NEEDED UP TO THREE TIMES DAILY FOR LEG PAIN  . Sucroferric Oxyhydroxide (VELPHORO PO) Take 2 capsules by mouth as directed.  . [DISCONTINUED] MATZIM LA 180 MG 24 hr tablet Take 180 mg by mouth daily.      Allergies:   Iodine; Other; Shellfish allergy; and Norvasc [amlodipine]    Social History   Socioeconomic History  . Marital status: Single    Spouse name: Not on file  . Number of children: Not on file  . Years of education: Not on file  . Highest education level: Not on file  Occupational History  . Not on file  Social Needs  . Financial resource strain: Not on file  . Food insecurity:    Worry: Not on file    Inability: Not on file  . Transportation needs:    Medical: Not on file    Non-medical: Not on file  Tobacco Use  . Smoking status: Never Smoker  . Smokeless tobacco: Never Used  Substance and Sexual Activity  . Alcohol use: No    Alcohol/week: 0.0 standard drinks  . Drug use: No  . Sexual activity: Not on file  Lifestyle  . Physical activity:    Days per week: Not on file    Minutes per session: Not on file  . Stress: Not on file  Relationships  . Social connections:    Talks on phone: Not on file    Gets together: Not on file    Attends religious service: Not on file    Active member of club or organization: Not on file    Attends meetings of clubs or organizations: Not on file    Relationship status: Not on file  Other Topics Concern  . Not on file  Social History Narrative   Lives in [place] alone. 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     Family History: The patient's family history includes Diabetes in her brother; Hypertension in her mother; Kidney disease in her brother; Lung cancer in her father.  ROS:   Please see the history of present illness.    All other systems reviewed and are negative.  EKGs/Labs/Other Studies Reviewed:    The following studies were reviewed today:  Myoview 04/17/17:  Nuclear stress EF: 68%.  There was no ST segment deviation noted during stress.  The study is normal.  This is a low risk study.  The left ventricular ejection fraction is hyperdynamic (>65%).   Normal pharmacologic nuclear study with no evidence of prior infarct or ischemia.  Myoview  1/11 EF 65, inferolateral ischemia *Study reviewed by cardiologist in office at follow-up appointment - felt to be low risk, medical therapy recommended*  Echo 1/11 Mild LVH, EF 60-60, normal wall motion, grade 1 diastolic dysfunction, trivial pericardial effusion   EKG:  EKG is ordered today.  The ekg ordered today demonstrates sinus rhythm with old inferior infarct, not new  Recent Labs: No results found for requested labs within last 8760 hours.  Recent Lipid Panel No results found for: CHOL, TRIG, HDL, CHOLHDL, VLDL, LDLCALC, LDLDIRECT  Physical Exam:    VS:  BP (!) 103/57   Pulse 83   Ht 5' 3.75" (1.619 m)   Wt 224 lb (101.6 kg)   BMI 38.75 kg/m     Wt Readings from Last 3 Encounters:  07/24/18 224 lb (101.6 kg)  04/14/18 224 lb 8 oz (101.8 kg)  05/26/17 267 lb (121.1 kg)     GEN: Obese AA female in no acute distress, in wheelchair/using walker HEENT: Normal NECK: No JVD CARDIAC: RRR, no murmurs, rubs, gallops RESPIRATORY:  Clear to auscultation without rales, wheezing or rhonchi  ABDOMEN: Soft, non-tender, non-distended MUSCULOSKELETAL:  No edema; compression socks in place, left lower arm fistula SKIN: Warm and dry NEUROLOGIC:  Alert and oriented x 3 PSYCHIATRIC:  Normal affect   ASSESSMENT:    1. Paroxysmal atrial fibrillation (HCC)   2. OSA (obstructive sleep apnea)   3. Type 2 diabetes mellitus with complication, without long-term current use of insulin ([place])   4. End stage renal disease ([place])   5. Preoperative clearance    PLAN:    In order of problems listed above:  Paroxysmal atrial fibrillation ([place]) - Plan: EKG 12-Lead EKG was sinus rhythm today.  She reports no palpitations.  She is not on anticoagulation.  OSA (obstructive sleep apnea) She is not compliant on her CPAP.  We discussed how CPAP is good for her cardiovascular health.  Type 2 diabetes mellitus with complication, without long-term current use of insulin ([place]) She is not on  insulin.  End stage renal disease ([place]) She is compliant with dialysis.  Preoperative Clearance She has questionable CAD, last Myoview 04/17/2017 without reversible ischemia is reassuring.  She reports no symptoms of angina or fluid overload.  She does not carry a diagnosis of CHF.  She is not on insulin for her diabetes.  She is compliant with her dialysis.  She cannot complete 4.0 METS given her bilateral hip pain.  We discussed that given her comorbidities she is a high risk for surgery, but that this risk would not outweigh the benefits of her hip surgery.  She would like to proceed with surgery given these known risks.  She does not need any further cardiac testing prior to surgery.  I will fax my clearance to the requesting provider.  Follow up in 1 year.   Medication Adjustments/Labs and Tests Ordered: Current medicines are reviewed at length with the patient today.  Concerns regarding medicines are outlined above.  Orders Placed This Encounter  Procedures  . EKG 12-Lead   No orders of the defined types were placed in this encounter.   Signed, Ledora Bottcher, [place]  07/24/2018 12:19 PM    [facility]

## 2018-07-24 ENCOUNTER — Encounter: Payer: Self-pay | Admitting: Physician Assistant

## 2018-07-24 ENCOUNTER — Ambulatory Visit (INDEPENDENT_AMBULATORY_CARE_PROVIDER_SITE_OTHER): Payer: BLUE CROSS/BLUE SHIELD | Admitting: Physician Assistant

## 2018-07-24 VITALS — BP 103/57 | HR 83 | Ht 63.75 in | Wt 224.0 lb

## 2018-07-24 DIAGNOSIS — Z01818 Encounter for other preprocedural examination: Secondary | ICD-10-CM

## 2018-07-24 DIAGNOSIS — I480 Paroxysmal atrial fibrillation: Secondary | ICD-10-CM | POA: Diagnosis not present

## 2018-07-24 DIAGNOSIS — G4733 Obstructive sleep apnea (adult) (pediatric): Secondary | ICD-10-CM | POA: Diagnosis not present

## 2018-07-24 DIAGNOSIS — N2581 Secondary hyperparathyroidism of renal origin: Secondary | ICD-10-CM | POA: Diagnosis not present

## 2018-07-24 DIAGNOSIS — N186 End stage renal disease: Secondary | ICD-10-CM | POA: Diagnosis not present

## 2018-07-24 DIAGNOSIS — E118 Type 2 diabetes mellitus with unspecified complications: Secondary | ICD-10-CM | POA: Diagnosis not present

## 2018-07-24 NOTE — Patient Instructions (Signed)
Medication Instructions:  No Changes. If you need a refill on your cardiac medications before your next appointment, please call your pharmacy.  Labwork: None Ordered.  Testing/Procedures: None Ordered.  Follow-Up: Your physician wants you to follow-up in: 12 months with Primary Care Physician.    Thank you for choosing [facility] at [facility]!!

## 2018-07-27 DIAGNOSIS — N186 End stage renal disease: Secondary | ICD-10-CM | POA: Diagnosis not present

## 2018-07-27 DIAGNOSIS — N2581 Secondary hyperparathyroidism of renal origin: Secondary | ICD-10-CM | POA: Diagnosis not present

## 2018-07-29 DIAGNOSIS — N2581 Secondary hyperparathyroidism of renal origin: Secondary | ICD-10-CM | POA: Diagnosis not present

## 2018-07-29 DIAGNOSIS — N186 End stage renal disease: Secondary | ICD-10-CM | POA: Diagnosis not present

## 2018-07-31 DIAGNOSIS — N186 End stage renal disease: Secondary | ICD-10-CM | POA: Diagnosis not present

## 2018-07-31 DIAGNOSIS — N2581 Secondary hyperparathyroidism of renal origin: Secondary | ICD-10-CM | POA: Diagnosis not present

## 2018-08-01 DIAGNOSIS — E1122 Type 2 diabetes mellitus with diabetic chronic kidney disease: Secondary | ICD-10-CM | POA: Diagnosis not present

## 2018-08-01 DIAGNOSIS — N186 End stage renal disease: Secondary | ICD-10-CM | POA: Diagnosis not present

## 2018-08-01 DIAGNOSIS — Z992 Dependence on renal dialysis: Secondary | ICD-10-CM | POA: Diagnosis not present

## 2018-08-03 DIAGNOSIS — N2581 Secondary hyperparathyroidism of renal origin: Secondary | ICD-10-CM | POA: Diagnosis not present

## 2018-08-03 DIAGNOSIS — N186 End stage renal disease: Secondary | ICD-10-CM | POA: Diagnosis not present

## 2018-08-05 DIAGNOSIS — N2581 Secondary hyperparathyroidism of renal origin: Secondary | ICD-10-CM | POA: Diagnosis not present

## 2018-08-05 DIAGNOSIS — N186 End stage renal disease: Secondary | ICD-10-CM | POA: Diagnosis not present

## 2018-08-07 DIAGNOSIS — N2581 Secondary hyperparathyroidism of renal origin: Secondary | ICD-10-CM | POA: Diagnosis not present

## 2018-08-07 DIAGNOSIS — N186 End stage renal disease: Secondary | ICD-10-CM | POA: Diagnosis not present

## 2018-08-10 DIAGNOSIS — N186 End stage renal disease: Secondary | ICD-10-CM | POA: Diagnosis not present

## 2018-08-10 DIAGNOSIS — N2581 Secondary hyperparathyroidism of renal origin: Secondary | ICD-10-CM | POA: Diagnosis not present

## 2018-08-12 DIAGNOSIS — N2581 Secondary hyperparathyroidism of renal origin: Secondary | ICD-10-CM | POA: Diagnosis not present

## 2018-08-12 DIAGNOSIS — N186 End stage renal disease: Secondary | ICD-10-CM | POA: Diagnosis not present

## 2018-08-14 DIAGNOSIS — N2581 Secondary hyperparathyroidism of renal origin: Secondary | ICD-10-CM | POA: Diagnosis not present

## 2018-08-14 DIAGNOSIS — N186 End stage renal disease: Secondary | ICD-10-CM | POA: Diagnosis not present

## 2018-08-17 DIAGNOSIS — N2581 Secondary hyperparathyroidism of renal origin: Secondary | ICD-10-CM | POA: Diagnosis not present

## 2018-08-17 DIAGNOSIS — N186 End stage renal disease: Secondary | ICD-10-CM | POA: Diagnosis not present

## 2018-08-19 DIAGNOSIS — N2581 Secondary hyperparathyroidism of renal origin: Secondary | ICD-10-CM | POA: Diagnosis not present

## 2018-08-19 DIAGNOSIS — N186 End stage renal disease: Secondary | ICD-10-CM | POA: Diagnosis not present

## 2018-08-21 DIAGNOSIS — N2581 Secondary hyperparathyroidism of renal origin: Secondary | ICD-10-CM | POA: Diagnosis not present

## 2018-08-21 DIAGNOSIS — N186 End stage renal disease: Secondary | ICD-10-CM | POA: Diagnosis not present

## 2018-08-24 DIAGNOSIS — N2581 Secondary hyperparathyroidism of renal origin: Secondary | ICD-10-CM | POA: Diagnosis not present

## 2018-08-24 DIAGNOSIS — N186 End stage renal disease: Secondary | ICD-10-CM | POA: Diagnosis not present

## 2018-08-26 DIAGNOSIS — N2581 Secondary hyperparathyroidism of renal origin: Secondary | ICD-10-CM | POA: Diagnosis not present

## 2018-08-26 DIAGNOSIS — N186 End stage renal disease: Secondary | ICD-10-CM | POA: Diagnosis not present

## 2018-08-27 ENCOUNTER — Ambulatory Visit (INDEPENDENT_AMBULATORY_CARE_PROVIDER_SITE_OTHER): Payer: BLUE CROSS/BLUE SHIELD | Admitting: Podiatry

## 2018-08-27 ENCOUNTER — Encounter: Payer: Self-pay | Admitting: Podiatry

## 2018-08-27 DIAGNOSIS — M79672 Pain in left foot: Secondary | ICD-10-CM

## 2018-08-27 DIAGNOSIS — L600 Ingrowing nail: Secondary | ICD-10-CM | POA: Diagnosis not present

## 2018-08-27 DIAGNOSIS — B351 Tinea unguium: Secondary | ICD-10-CM | POA: Diagnosis not present

## 2018-08-27 DIAGNOSIS — M79671 Pain in right foot: Secondary | ICD-10-CM

## 2018-08-27 NOTE — Patient Instructions (Signed)
Seen for hypertrophic nails. All nails debrided. Return in 3 months or as needed.  

## 2018-08-27 NOTE — Progress Notes (Signed)
Subjective: 62 y.o. year old female patient presents complaining of painful nails. Patient requests toe nails trimmed.  On Dialysis and still losing weight. Now about 220 lbs. Walking with aid of walker. Last A1c was 4.7 a month ago and is ready to have her hip surgery in October.  Objective: Dermatologic: Thick yellow deformed nails x 10. Ingrown hallucal nails both feet. Flaky dry skin both feet and leg. Vascular: Pedal pulses are all palpable. Orthopedic: No gross deformities. Neurologic: All epicritic and tactile sensations grossly intact.  Assessment: Dystrophic mycotic nails x 10. Painful hallucal nails. Pain in both feet.  Treatment: All mycotic nails debrided.  Return in 3 months or as needed.

## 2018-08-28 DIAGNOSIS — N2581 Secondary hyperparathyroidism of renal origin: Secondary | ICD-10-CM | POA: Diagnosis not present

## 2018-08-28 DIAGNOSIS — N186 End stage renal disease: Secondary | ICD-10-CM | POA: Diagnosis not present

## 2018-08-31 DIAGNOSIS — N186 End stage renal disease: Secondary | ICD-10-CM | POA: Diagnosis not present

## 2018-08-31 DIAGNOSIS — Z992 Dependence on renal dialysis: Secondary | ICD-10-CM | POA: Diagnosis not present

## 2018-08-31 DIAGNOSIS — E1122 Type 2 diabetes mellitus with diabetic chronic kidney disease: Secondary | ICD-10-CM | POA: Diagnosis not present

## 2018-08-31 DIAGNOSIS — N2581 Secondary hyperparathyroidism of renal origin: Secondary | ICD-10-CM | POA: Diagnosis not present

## 2018-08-31 DIAGNOSIS — Z23 Encounter for immunization: Secondary | ICD-10-CM | POA: Diagnosis not present

## 2018-09-02 DIAGNOSIS — N2581 Secondary hyperparathyroidism of renal origin: Secondary | ICD-10-CM | POA: Diagnosis not present

## 2018-09-02 DIAGNOSIS — N186 End stage renal disease: Secondary | ICD-10-CM | POA: Diagnosis not present

## 2018-09-02 NOTE — Pre-Procedure Instructions (Signed)
Brenda Arnold  09/02/2018      [facility] [place] [place] [address] [place] [address] Phone: [phone] Fax: [phone]  [id number] [place], [address] [facility] [address] [place] [address] Phone: [phone] Fax: [phone]    Your procedure is scheduled on Tuesday, Oct. 15th   Report to [facility] Admitting at  5:30 AM             (posted surgery time 7:30a - 9:31a)   Call this number if you have problems the morning of surgery:  [phone]   Remember:   Do not eat any foods or drink any liquids after midnight, Monday.              5 days prior to surgery, STOP TAKING any Vitamins, Herbal Supplements, Anti-inflammatories, Blood Thinners.    Take these medicines the morning of surgery with A SIP OF WATER : Hydrocodone    Do not wear jewelry, make-up or nail polish.  Do not wear lotions, powders, perfumes, or deodorant.  Do not shave 48 hours prior to surgery.    Do not bring valuables to the hospital.  [facility] is not responsible for any belongings or valuables.  Contacts, dentures or bridgework may not be worn into surgery.  Leave your suitcase in the car.  After surgery it may be brought to your room.  For patients admitted to the hospital, discharge time will be determined by your treatment team.  Please read over the following fact sheets that you were given. MRSA Information and Surgical Site Infection Prevention       [facility]- Preparing For Surgery  Before surgery, you can play an important role. Because skin is not sterile, your skin needs to be as free of germs as possible. You can reduce the number of germs on your skin by washing with CHG (chlorahexidine gluconate) Soap before surgery.  CHG is an antiseptic cleaner which kills germs and bonds with the skin to continue killing germs even after  washing.    Oral Hygiene is also important to reduce your risk of infection.    Remember - BRUSH YOUR TEETH THE MORNING OF SURGERY WITH YOUR REGULAR TOOTHPASTE  Please do not use if you have an allergy to CHG or antibacterial soaps. If your skin becomes reddened/irritated stop using the CHG.  Do not shave (including legs and underarms) for at least 48 hours prior to first CHG shower. It is OK to shave your face.  Please follow these instructions carefully.   1. Shower the NIGHT BEFORE SURGERY and the MORNING OF SURGERY with CHG.   2. If you chose to wash your hair, wash your hair first as usual with your normal shampoo.  3. After you shampoo, rinse your hair and body thoroughly to remove the shampoo.  4. Use CHG as you would any other liquid soap. You can apply CHG directly to the skin and wash gently with a scrungie or a clean washcloth.   5. Apply the CHG Soap to your body ONLY FROM THE NECK DOWN.  Do not use on open wounds or open sores. Avoid contact with your eyes, ears, mouth and genitals (private parts). Wash Face and genitals (private parts)  with your normal soap.  6. Wash thoroughly, paying special attention to the area where your surgery will be performed.  7.  Thoroughly rinse your body with warm water from the neck down.  8. DO NOT shower/wash with your normal soap after using and rinsing off the CHG Soap.  9. Pat yourself dry with a CLEAN TOWEL.  10. Wear CLEAN PAJAMAS to bed the night before surgery, wear comfortable clothes the morning of surgery  11. Place CLEAN SHEETS on your bed the night of your first shower and DO NOT SLEEP WITH PETS.    Day of Surgery:  Do not apply any deodorants/lotions.  Please wear clean clothes to the hospital/surgery center.    Remember to brush your teeth WITH YOUR REGULAR TOOTHPASTE.

## 2018-09-03 ENCOUNTER — Other Ambulatory Visit: Payer: Self-pay

## 2018-09-03 ENCOUNTER — Encounter (HOSPITAL_COMMUNITY): Payer: Self-pay

## 2018-09-03 ENCOUNTER — Encounter (HOSPITAL_COMMUNITY)
Admission: RE | Admit: 2018-09-03 | Discharge: 2018-09-03 | Disposition: A | Discharge status: Home / Self Care | Payer: BLUE CROSS/BLUE SHIELD | Source: Ambulatory Visit | Attending: Orthopaedic Surgery | Admitting: Orthopaedic Surgery

## 2018-09-03 DIAGNOSIS — E559 Vitamin D deficiency, unspecified: Secondary | ICD-10-CM | POA: Insufficient documentation

## 2018-09-03 DIAGNOSIS — E785 Hyperlipidemia, unspecified: Secondary | ICD-10-CM | POA: Insufficient documentation

## 2018-09-03 DIAGNOSIS — N186 End stage renal disease: Secondary | ICD-10-CM | POA: Insufficient documentation

## 2018-09-03 DIAGNOSIS — G4733 Obstructive sleep apnea (adult) (pediatric): Secondary | ICD-10-CM | POA: Insufficient documentation

## 2018-09-03 DIAGNOSIS — Z01818 Encounter for other preprocedural examination: Secondary | ICD-10-CM | POA: Diagnosis not present

## 2018-09-03 DIAGNOSIS — I480 Paroxysmal atrial fibrillation: Secondary | ICD-10-CM | POA: Insufficient documentation

## 2018-09-03 DIAGNOSIS — Z79899 Other long term (current) drug therapy: Secondary | ICD-10-CM | POA: Diagnosis not present

## 2018-09-03 DIAGNOSIS — E1122 Type 2 diabetes mellitus with diabetic chronic kidney disease: Secondary | ICD-10-CM | POA: Diagnosis not present

## 2018-09-03 DIAGNOSIS — Z90710 Acquired absence of both cervix and uterus: Secondary | ICD-10-CM | POA: Diagnosis not present

## 2018-09-03 DIAGNOSIS — E282 Polycystic ovarian syndrome: Secondary | ICD-10-CM | POA: Diagnosis not present

## 2018-09-03 DIAGNOSIS — Z6839 Body mass index (BMI) 39.0-39.9, adult: Secondary | ICD-10-CM | POA: Diagnosis not present

## 2018-09-03 DIAGNOSIS — M1611 Unilateral primary osteoarthritis, right hip: Secondary | ICD-10-CM | POA: Insufficient documentation

## 2018-09-03 DIAGNOSIS — Z992 Dependence on renal dialysis: Secondary | ICD-10-CM | POA: Insufficient documentation

## 2018-09-03 DIAGNOSIS — I120 Hypertensive chronic kidney disease with stage 5 chronic kidney disease or end stage renal disease: Secondary | ICD-10-CM | POA: Insufficient documentation

## 2018-09-03 DIAGNOSIS — N2581 Secondary hyperparathyroidism of renal origin: Secondary | ICD-10-CM | POA: Insufficient documentation

## 2018-09-03 DIAGNOSIS — J45909 Unspecified asthma, uncomplicated: Secondary | ICD-10-CM | POA: Insufficient documentation

## 2018-09-03 DIAGNOSIS — E669 Obesity, unspecified: Secondary | ICD-10-CM | POA: Insufficient documentation

## 2018-09-03 LAB — CBC WITH DIFFERENTIAL/PLATELET
Abs Immature Granulocytes: 0 10*3/uL (ref 0.0–0.1)
Basophils Absolute: 0 10*3/uL (ref 0.0–0.1)
Basophils Relative: 1 %
Eosinophils Absolute: 0.2 10*3/uL (ref 0.0–0.7)
Eosinophils Relative: 3 %
HCT: 38.2 % (ref 36.0–46.0)
Hemoglobin: 11.7 g/dL — ABNORMAL LOW (ref 12.0–15.0)
Immature Granulocytes: 0 %
Lymphocytes Relative: 19 %
Lymphs Abs: 1.4 10*3/uL (ref 0.7–4.0)
MCH: 32 pg (ref 26.0–34.0)
MCHC: 30.6 g/dL (ref 30.0–36.0)
MCV: 104.4 fL — ABNORMAL HIGH (ref 78.0–100.0)
Monocytes Absolute: 0.7 10*3/uL (ref 0.1–1.0)
Monocytes Relative: 10 %
Neutro Abs: 4.9 10*3/uL (ref 1.7–7.7)
Neutrophils Relative %: 67 %
Platelets: 139 10*3/uL — ABNORMAL LOW (ref 150–400)
RBC: 3.66 MIL/uL — ABNORMAL LOW (ref 3.87–5.11)
RDW: 15.5 % (ref 11.5–15.5)
WBC: 7.2 10*3/uL (ref 4.0–10.5)

## 2018-09-03 LAB — SURGICAL PCR SCREEN
MRSA, PCR: NEGATIVE
Staphylococcus aureus: NEGATIVE

## 2018-09-03 LAB — COMPREHENSIVE METABOLIC PANEL
ALT: 15 U/L (ref 0–44)
CO2: 21 mmol/L — ABNORMAL LOW (ref 22–32)
Calcium: 9.1 mg/dL (ref 8.9–10.3)
Chloride: 106 mmol/L (ref 98–111)
Creatinine, Ser: 6.93 mg/dL — ABNORMAL HIGH (ref 0.44–1.00)
GFR calc Af Amer: 7 mL/min — ABNORMAL LOW (ref >60)
Sodium: 137 mmol/L (ref 135–145)

## 2018-09-03 LAB — COMPREHENSIVE METABOLIC PANEL WITH GFR
AST: 18 U/L (ref 15–41)
Albumin: 3.7 g/dL (ref 3.5–5.0)
Alkaline Phosphatase: 69 U/L (ref 38–126)
Anion gap: 10 (ref 5–15)
BUN: 38 mg/dL — ABNORMAL HIGH (ref 8–23)
GFR calc non Af Amer: 6 mL/min — ABNORMAL LOW (ref >60)
Glucose, Bld: 84 mg/dL (ref 70–99)
Potassium: 5.2 mmol/L — ABNORMAL HIGH (ref 3.5–5.1)
Total Bilirubin: 0.5 mg/dL (ref 0.3–1.2)
Total Protein: 7.4 g/dL (ref 6.5–8.1)

## 2018-09-03 LAB — GLUCOSE, CAPILLARY: Glucose-Capillary: 76 mg/dL (ref 70–99)

## 2018-09-03 LAB — PROTIME-INR
INR: 1.06
Prothrombin Time: 13.7 s (ref 11.4–15.2)

## 2018-09-03 LAB — TYPE AND SCREEN
ABO/RH(D): A POS
Antibody Screen: NEGATIVE

## 2018-09-03 LAB — ABO/RH: ABO/RH(D): A POS

## 2018-09-03 LAB — APTT: aPTT: 29 seconds (ref 24–36)

## 2018-09-03 NOTE — Progress Notes (Signed)
PCP is Dr. Lady Deutscher [facility] 05/2018 Nephrologist is Dr. Jimmy Footman - has dialysis M-W-F   Clearance note in chart Currently denies murmur, cp, sob. Cardio is Dr. Ashok Norris   LOV 08/2018  Clearance note 07/2018 Doesn't check her sugars, nor has a machine.   Last A1C 4.9 in 07/2018 Sleep study approx 3-5 yrs ago.  She could never tolerate the mask (she has lost weight also)

## 2018-09-04 DIAGNOSIS — N186 End stage renal disease: Secondary | ICD-10-CM | POA: Diagnosis not present

## 2018-09-04 DIAGNOSIS — N2581 Secondary hyperparathyroidism of renal origin: Secondary | ICD-10-CM | POA: Diagnosis not present

## 2018-09-04 NOTE — Progress Notes (Signed)
Anesthesia Chart Review:  Case:  [id number] Date/Time:  09/15/18 0715   Procedure:  RIGHT TOTAL HIP ARTHROPLASTY ANTERIOR APPROACH (Right )   Anesthesia type:  Spinal   Pre-op diagnosis:  right hip degenerative joint disease   Location:  [facility] OR ROOM 04 / [place] [facility]   Surgeon:  Leandrew Koyanagi, MD      DISCUSSION: 62 yo female never smoker. Pertinent hx includes PONV (per anesthesia notes 04/27/2014), DMII, Paroxysmal Afib, HTN, Asthma, OSA not on CPAP, ESRD on HD MWF, Complication with anesthesia "difficulty getting oxygen saturation up".  Cardiac clearance per telephone encounter by Fabian Sharp, PA-C 07/24/2018:  "She has questionable CAD, last Myoview 04/17/2017 without reversible ischemia is reassuring.  She reports no symptoms of angina or fluid overload.  She does not carry a diagnosis of CHF.  She is not on insulin for her diabetes.  She is compliant with her dialysis.  She cannot complete 4.0 METS given her bilateral hip pain.  We discussed that given her comorbidities she is a high risk for surgery, but that this risk would not outweigh the benefits of her hip surgery.  She would like to proceed with surgery given these known risks.  She does not need any further cardiac testing prior to surgery.  I will fax my clearance to the requesting provider."  Anticipate she can proceed as planned barring acute status change.  VS: BP 103/75   Pulse 85   Temp 36.9 C (Oral)   Resp 18   Ht 5\' 3"  (1.6 m)   Wt 102.4 kg   SpO2 100%   BMI 39.98 kg/m   PROVIDERS: Kelton Pillar, MD is PCP  Deterding, Jeneen Rinks, MD is Nephrologist, cleared pt for surgery 08/05/18  LABS: Labs reviewed: Acceptable for surgery. Labs c/w pt's ESRD (all labs ordered are listed, but only abnormal results are displayed)  Labs Reviewed  CBC WITH DIFFERENTIAL/PLATELET - Abnormal; Notable for the following components:      Result Value   RBC 3.66 (*)    Hemoglobin 11.7 (*)    MCV 104.4 (*)    Platelets 139 (*)    All other  components within normal limits  COMPREHENSIVE METABOLIC PANEL - Abnormal; Notable for the following components:   Potassium 5.2 (*)    CO2 21 (*)    BUN 38 (*)    Creatinine, Ser 6.93 (*)    GFR calc non Af Amer 6 (*)    GFR calc Af Amer 7 (*)    All other components within normal limits  SURGICAL PCR SCREEN  GLUCOSE, CAPILLARY  APTT  PROTIME-INR  TYPE AND SCREEN  ABO/RH   Lab Results  Component Value Date   HGBA1C 4.9 07/21/2018    IMAGES: N/A  EKG: 09/03/2018: Left axis deviation. Low voltage QRS. Inferior infarct , age undetermined. Cannot rule out Anterior infarct , age undetermined  CV: Myocardial perfusion scan 04/17/2017:  Nuclear stress EF: 68%.  There was no ST segment deviation noted during stress.  The study is normal.  This is a low risk study.  The left ventricular ejection fraction is hyperdynamic (>65%).   Normal pharmacologic nuclear study with no evidence of prior infarct or ischemia.  Past Medical History:  Diagnosis Date  . Anemia   . Arthritis   . Asthma   . CKD (chronic kidney disease), stage IV ([place])   . Complication of anesthesia    difficulty with getting oxygen saturation up  . Diabetes mellitus   .  Dyslipidemia   . Epistaxis 11/05/2012  . History of nuclear stress test    Myoview 5/18: EF 68, no infarct or ischemia, low risk  . HTN (hypertension)   . Hyperlipidemia   . Hyperparathyroidism due to renal insufficiency ([place])   . Obesity    s/p panniculectomy  . Paroxysmal A-fib (H[facility])    a. chronic coumadin;  b. 12/2009 Echo: EF 60-65%, Gr 1 DD.  Marland Kitchen PCOS (polycystic ovarian syndrome)   . Pneumonia   . Seasonal allergies   . Sleep apnea    a. not using CPAP, last study  >8 yrs  . Vitamin D deficiency     Past Surgical History:  Procedure Laterality Date  . ABDOMINAL HYSTERECTOMY     with panniculctomy  . AV FISTULA PLACEMENT  09/02/2012   Procedure: ARTERIOVENOUS (AV) FISTULA CREATION;  Surgeon: Elam Dutch, MD;  Location:  [facility] OR;  Service: Vascular;  Laterality: Left;  Creation of Left Radial-Cephalic Fistula   . BREAST SURGERY     Biopsy right breast  . COLONOSCOPY W/ BIOPSIES AND POLYPECTOMY    . DILATION AND CURETTAGE OF UTERUS    . hysterectomy (other)    . REVISON OF ARTERIOVENOUS FISTULA Left 04/27/2014   Procedure: REVISON OF LEFT RADIAL-CEPHALIC ARTERIOVENOUS FISTULA;  Surgeon: Mal Misty, MD;  Location: [facility];  Service: Vascular;  Laterality: Left;  . right knee surgery    . uvuloplasty      MEDICATIONS: . B Complex-C-Folic Acid (DIALYVITE TABLET) TABS  . ethyl chloride spray  . gabapentin (NEURONTIN) 100 MG capsule  . HYDROcodone-acetaminophen (NORCO/VICODIN) 5-325 MG tablet  . sucroferric oxyhydroxide (VELPHORO) 500 MG chewable tablet   No current facility-administered medications for this encounter.    Wynonia Musty [facility] Short Stay [facility]/Anesthesiology Phone [phone] 09/04/2018 2:42 PM

## 2018-09-07 DIAGNOSIS — N2581 Secondary hyperparathyroidism of renal origin: Secondary | ICD-10-CM | POA: Diagnosis not present

## 2018-09-07 DIAGNOSIS — N186 End stage renal disease: Secondary | ICD-10-CM | POA: Diagnosis not present

## 2018-09-09 DIAGNOSIS — N186 End stage renal disease: Secondary | ICD-10-CM | POA: Diagnosis not present

## 2018-09-09 DIAGNOSIS — N2581 Secondary hyperparathyroidism of renal origin: Secondary | ICD-10-CM | POA: Diagnosis not present

## 2018-09-11 DIAGNOSIS — N2581 Secondary hyperparathyroidism of renal origin: Secondary | ICD-10-CM | POA: Diagnosis not present

## 2018-09-11 DIAGNOSIS — Z23 Encounter for immunization: Secondary | ICD-10-CM | POA: Diagnosis not present

## 2018-09-11 DIAGNOSIS — N186 End stage renal disease: Secondary | ICD-10-CM | POA: Diagnosis not present

## 2018-09-14 DIAGNOSIS — N2581 Secondary hyperparathyroidism of renal origin: Secondary | ICD-10-CM | POA: Diagnosis not present

## 2018-09-14 DIAGNOSIS — N186 End stage renal disease: Secondary | ICD-10-CM | POA: Diagnosis not present

## 2018-09-14 MED ORDER — TRANEXAMIC ACID 1000 MG/10ML IV SOLN
2000.0000 mg | INTRAVENOUS | Status: AC
  Administered 2018-09-15: 2000 mg via TOPICAL
  Filled 2018-09-14: qty 20

## 2018-09-14 MED ORDER — TRANEXAMIC ACID-NACL 1000-0.7 MG/100ML-% IV SOLN
1000.0000 mg | INTRAVENOUS | Status: DC
  Filled 2018-09-14: qty 100

## 2018-09-15 ENCOUNTER — Inpatient Hospital Stay (HOSPITAL_COMMUNITY): Payer: BLUE CROSS/BLUE SHIELD

## 2018-09-15 ENCOUNTER — Inpatient Hospital Stay (HOSPITAL_COMMUNITY): Payer: BLUE CROSS/BLUE SHIELD | Admitting: Vascular Surgery

## 2018-09-15 ENCOUNTER — Inpatient Hospital Stay (HOSPITAL_COMMUNITY): Payer: BLUE CROSS/BLUE SHIELD | Admitting: Anesthesiology

## 2018-09-15 ENCOUNTER — Inpatient Hospital Stay (HOSPITAL_COMMUNITY)
Admission: RE | Admit: 2018-09-15 | Discharge: 2018-09-17 | DRG: 469 | Disposition: A | Discharge status: Home Health | Payer: BLUE CROSS/BLUE SHIELD | Attending: Orthopaedic Surgery | Admitting: Orthopaedic Surgery

## 2018-09-15 ENCOUNTER — Encounter (HOSPITAL_COMMUNITY)
Admission: RE | Disposition: A | Discharge status: Home Health | Payer: Self-pay | Source: Home / Self Care | Attending: Orthopaedic Surgery

## 2018-09-15 ENCOUNTER — Other Ambulatory Visit: Payer: Self-pay

## 2018-09-15 ENCOUNTER — Encounter (HOSPITAL_COMMUNITY): Payer: Self-pay | Admitting: *Deleted

## 2018-09-15 DIAGNOSIS — E669 Obesity, unspecified: Secondary | ICD-10-CM | POA: Diagnosis not present

## 2018-09-15 DIAGNOSIS — Z888 Allergy status to other drugs, medicaments and biological substances status: Secondary | ICD-10-CM | POA: Diagnosis not present

## 2018-09-15 DIAGNOSIS — Z7982 Long term (current) use of aspirin: Secondary | ICD-10-CM

## 2018-09-15 DIAGNOSIS — Z801 Family history of malignant neoplasm of trachea, bronchus and lung: Secondary | ICD-10-CM | POA: Diagnosis not present

## 2018-09-15 DIAGNOSIS — Z79899 Other long term (current) drug therapy: Secondary | ICD-10-CM

## 2018-09-15 DIAGNOSIS — I120 Hypertensive chronic kidney disease with stage 5 chronic kidney disease or end stage renal disease: Secondary | ICD-10-CM | POA: Diagnosis not present

## 2018-09-15 DIAGNOSIS — Z841 Family history of disorders of kidney and ureter: Secondary | ICD-10-CM | POA: Diagnosis not present

## 2018-09-15 DIAGNOSIS — R011 Cardiac murmur, unspecified: Secondary | ICD-10-CM | POA: Diagnosis not present

## 2018-09-15 DIAGNOSIS — Z90710 Acquired absence of both cervix and uterus: Secondary | ICD-10-CM

## 2018-09-15 DIAGNOSIS — I480 Paroxysmal atrial fibrillation: Secondary | ICD-10-CM | POA: Diagnosis not present

## 2018-09-15 DIAGNOSIS — N2581 Secondary hyperparathyroidism of renal origin: Secondary | ICD-10-CM | POA: Diagnosis present

## 2018-09-15 DIAGNOSIS — D631 Anemia in chronic kidney disease: Secondary | ICD-10-CM | POA: Diagnosis not present

## 2018-09-15 DIAGNOSIS — Z992 Dependence on renal dialysis: Secondary | ICD-10-CM | POA: Diagnosis not present

## 2018-09-15 DIAGNOSIS — M1611 Unilateral primary osteoarthritis, right hip: Secondary | ICD-10-CM | POA: Diagnosis not present

## 2018-09-15 DIAGNOSIS — Z91013 Allergy to seafood: Secondary | ICD-10-CM

## 2018-09-15 DIAGNOSIS — Z833 Family history of diabetes mellitus: Secondary | ICD-10-CM

## 2018-09-15 DIAGNOSIS — Z7901 Long term (current) use of anticoagulants: Secondary | ICD-10-CM | POA: Diagnosis not present

## 2018-09-15 DIAGNOSIS — G4730 Sleep apnea, unspecified: Secondary | ICD-10-CM | POA: Diagnosis present

## 2018-09-15 DIAGNOSIS — E1122 Type 2 diabetes mellitus with diabetic chronic kidney disease: Secondary | ICD-10-CM | POA: Diagnosis present

## 2018-09-15 DIAGNOSIS — E785 Hyperlipidemia, unspecified: Secondary | ICD-10-CM | POA: Diagnosis present

## 2018-09-15 DIAGNOSIS — Z471 Aftercare following joint replacement surgery: Secondary | ICD-10-CM | POA: Diagnosis not present

## 2018-09-15 DIAGNOSIS — N186 End stage renal disease: Secondary | ICD-10-CM | POA: Diagnosis not present

## 2018-09-15 DIAGNOSIS — Z96641 Presence of right artificial hip joint: Secondary | ICD-10-CM

## 2018-09-15 DIAGNOSIS — J45909 Unspecified asthma, uncomplicated: Secondary | ICD-10-CM | POA: Diagnosis present

## 2018-09-15 DIAGNOSIS — Z96649 Presence of unspecified artificial hip joint: Secondary | ICD-10-CM

## 2018-09-15 DIAGNOSIS — Z6839 Body mass index (BMI) 39.0-39.9, adult: Secondary | ICD-10-CM

## 2018-09-15 DIAGNOSIS — I951 Orthostatic hypotension: Secondary | ICD-10-CM | POA: Diagnosis present

## 2018-09-15 DIAGNOSIS — E282 Polycystic ovarian syndrome: Secondary | ICD-10-CM | POA: Diagnosis not present

## 2018-09-15 DIAGNOSIS — G4733 Obstructive sleep apnea (adult) (pediatric): Secondary | ICD-10-CM | POA: Diagnosis not present

## 2018-09-15 DIAGNOSIS — E559 Vitamin D deficiency, unspecified: Secondary | ICD-10-CM | POA: Diagnosis present

## 2018-09-15 DIAGNOSIS — D62 Acute posthemorrhagic anemia: Secondary | ICD-10-CM | POA: Diagnosis not present

## 2018-09-15 DIAGNOSIS — Z419 Encounter for procedure for purposes other than remedying health state, unspecified: Secondary | ICD-10-CM

## 2018-09-15 DIAGNOSIS — Z79891 Long term (current) use of opiate analgesic: Secondary | ICD-10-CM

## 2018-09-15 DIAGNOSIS — Z8249 Family history of ischemic heart disease and other diseases of the circulatory system: Secondary | ICD-10-CM | POA: Diagnosis not present

## 2018-09-15 HISTORY — PX: TOTAL HIP ARTHROPLASTY: SHX124

## 2018-09-15 HISTORY — DX: End stage renal disease: N18.6

## 2018-09-15 HISTORY — DX: Dependence on renal dialysis: Z99.2

## 2018-09-15 LAB — POCT I-STAT 4, (NA,K, GLUC, HGB,HCT)
Glucose, Bld: 79 mg/dL (ref 70–99)
HCT: 36 % (ref 36.0–46.0)
Hemoglobin: 12.2 g/dL (ref 12.0–15.0)
Potassium: 4.2 mmol/L (ref 3.5–5.1)
Sodium: 138 mmol/L (ref 135–145)

## 2018-09-15 LAB — GLUCOSE, CAPILLARY
Glucose-Capillary: 76 mg/dL (ref 70–99)
Glucose-Capillary: 85 mg/dL (ref 70–99)

## 2018-09-15 SURGERY — ARTHROPLASTY, HIP, TOTAL, ANTERIOR APPROACH
Anesthesia: Spinal | Laterality: Right

## 2018-09-15 MED ORDER — SORBITOL 70 % SOLN: 30.0000 mL | Freq: Every day | Status: DC | PRN

## 2018-09-15 MED ORDER — MIDAZOLAM HCL 5 MG/5ML IJ SOLN
INTRAMUSCULAR | Status: DC | PRN
  Administered 2018-09-15: 2 mg via INTRAVENOUS

## 2018-09-15 MED ORDER — ONDANSETRON HCL 4 MG/2ML IJ SOLN: 4.0000 mg | Freq: Four times a day (QID) | INTRAMUSCULAR | Status: DC | PRN

## 2018-09-15 MED ORDER — OXYCODONE HCL 5 MG PO TABS
10.0000 mg | ORAL_TABLET | ORAL | Status: DC | PRN
  Administered 2018-09-15: 10 mg via ORAL
  Filled 2018-09-15: qty 2

## 2018-09-15 MED ORDER — ONDANSETRON HCL 4 MG/2ML IJ SOLN
INTRAMUSCULAR | Status: AC
  Filled 2018-09-15: qty 2

## 2018-09-15 MED ORDER — OXYCODONE HCL 5 MG PO TABS
ORAL_TABLET | ORAL | Status: AC
  Filled 2018-09-15: qty 1

## 2018-09-15 MED ORDER — LACTATED RINGERS IV SOLN: INTRAVENOUS | Status: DC

## 2018-09-15 MED ORDER — CEFAZOLIN SODIUM-DEXTROSE 2-4 GM/100ML-% IV SOLN
2.0000 g | INTRAVENOUS | Status: AC
  Administered 2018-09-15: 2 g via INTRAVENOUS
  Filled 2018-09-15: qty 100

## 2018-09-15 MED ORDER — PHENYLEPHRINE 40 MCG/ML (10ML) SYRINGE FOR IV PUSH (FOR BLOOD PRESSURE SUPPORT)
PREFILLED_SYRINGE | INTRAVENOUS | Status: DC | PRN
  Administered 2018-09-15: 120 ug via INTRAVENOUS
  Administered 2018-09-15: 80 ug via INTRAVENOUS
  Administered 2018-09-15: 120 ug via INTRAVENOUS
  Administered 2018-09-15 (×2): 80 ug via INTRAVENOUS

## 2018-09-15 MED ORDER — OXYCODONE HCL 5 MG PO TABS
5.0000 mg | ORAL_TABLET | ORAL | Status: DC | PRN
  Administered 2018-09-15: 5 mg via ORAL

## 2018-09-15 MED ORDER — ASPIRIN 81 MG PO CHEW
81.0000 mg | CHEWABLE_TABLET | Freq: Two times a day (BID) | ORAL | Status: DC
  Filled 2018-09-15 (×2): qty 1

## 2018-09-15 MED ORDER — ALBUMIN HUMAN 5 % IV SOLN
12.5000 g | Freq: Once | INTRAVENOUS | Status: AC
  Administered 2018-09-15: 12.5 g via INTRAVENOUS

## 2018-09-15 MED ORDER — MAGNESIUM CITRATE PO SOLN: 1.0000 | Freq: Once | ORAL | Status: DC | PRN

## 2018-09-15 MED ORDER — CHLORHEXIDINE GLUCONATE 4 % EX LIQD: 60.0000 mL | Freq: Once | CUTANEOUS | Status: DC

## 2018-09-15 MED ORDER — DEXAMETHASONE SODIUM PHOSPHATE 10 MG/ML IJ SOLN
10.0000 mg | Freq: Once | INTRAMUSCULAR | Status: AC
  Administered 2018-09-16: 10 mg via INTRAVENOUS
  Filled 2018-09-15: qty 1

## 2018-09-15 MED ORDER — MENTHOL 3 MG MT LOZG: 1.0000 | LOZENGE | OROMUCOSAL | Status: DC | PRN

## 2018-09-15 MED ORDER — PHENYLEPHRINE 40 MCG/ML (10ML) SYRINGE FOR IV PUSH (FOR BLOOD PRESSURE SUPPORT)
PREFILLED_SYRINGE | INTRAVENOUS | Status: AC
  Filled 2018-09-15: qty 10

## 2018-09-15 MED ORDER — GABAPENTIN 300 MG PO CAPS: 300.0000 mg | ORAL_CAPSULE | Freq: Three times a day (TID) | ORAL | Status: DC

## 2018-09-15 MED ORDER — PROPOFOL 10 MG/ML IV BOLUS
INTRAVENOUS | Status: AC
  Filled 2018-09-15: qty 20

## 2018-09-15 MED ORDER — ALUM & MAG HYDROXIDE-SIMETH 200-200-20 MG/5ML PO SUSP: 30.0000 mL | ORAL | Status: DC | PRN

## 2018-09-15 MED ORDER — OXYCODONE HCL 5 MG PO TABS: 5.0000 mg | ORAL_TABLET | ORAL | 0 refills | Status: DC | PRN

## 2018-09-15 MED ORDER — DIPHENHYDRAMINE HCL 50 MG/ML IJ SOLN
INTRAMUSCULAR | Status: AC
  Filled 2018-09-15: qty 1

## 2018-09-15 MED ORDER — GABAPENTIN 100 MG PO CAPS
100.0000 mg | ORAL_CAPSULE | Freq: Every day | ORAL | Status: DC
  Administered 2018-09-15 – 2018-09-16 (×2): 100 mg via ORAL
  Filled 2018-09-15 (×2): qty 1

## 2018-09-15 MED ORDER — ONDANSETRON HCL 4 MG PO TABS: 4.0000 mg | ORAL_TABLET | Freq: Four times a day (QID) | ORAL | Status: DC | PRN

## 2018-09-15 MED ORDER — SENNOSIDES-DOCUSATE SODIUM 8.6-50 MG PO TABS: 1.0000 | ORAL_TABLET | Freq: Every evening | ORAL | 1 refills | Status: DC | PRN

## 2018-09-15 MED ORDER — HYDROMORPHONE HCL 1 MG/ML IJ SOLN
0.2500 mg | INTRAMUSCULAR | Status: DC | PRN
  Administered 2018-09-15 (×2): 0.5 mg via INTRAVENOUS

## 2018-09-15 MED ORDER — CHLORHEXIDINE GLUCONATE CLOTH 2 % EX PADS: 6.0000 | MEDICATED_PAD | Freq: Every day | CUTANEOUS | Status: DC

## 2018-09-15 MED ORDER — PROMETHAZINE HCL 25 MG PO TABS: 25.0000 mg | ORAL_TABLET | Freq: Four times a day (QID) | ORAL | 1 refills | Status: DC | PRN

## 2018-09-15 MED ORDER — SODIUM CHLORIDE 0.9 % IR SOLN
Status: DC | PRN
  Administered 2018-09-15: 3000 mL

## 2018-09-15 MED ORDER — OXYCODONE HCL ER 10 MG PO T12A
10.0000 mg | EXTENDED_RELEASE_TABLET | Freq: Two times a day (BID) | ORAL | Status: DC
  Administered 2018-09-15 – 2018-09-17 (×3): 10 mg via ORAL
  Filled 2018-09-15 (×3): qty 1

## 2018-09-15 MED ORDER — METOCLOPRAMIDE HCL 5 MG/ML IJ SOLN: 5.0000 mg | Freq: Three times a day (TID) | INTRAMUSCULAR | Status: DC | PRN

## 2018-09-15 MED ORDER — PROPOFOL 500 MG/50ML IV EMUL
INTRAVENOUS | Status: DC | PRN
  Administered 2018-09-15: 75 ug/kg/min via INTRAVENOUS

## 2018-09-15 MED ORDER — ACETAMINOPHEN 500 MG PO TABS
1000.0000 mg | ORAL_TABLET | Freq: Four times a day (QID) | ORAL | Status: AC
  Administered 2018-09-15 – 2018-09-16 (×4): 1000 mg via ORAL
  Filled 2018-09-15 (×4): qty 2

## 2018-09-15 MED ORDER — SODIUM CHLORIDE 0.9 % IV SOLN
INTRAVENOUS | Status: DC | PRN
  Administered 2018-09-15 (×2): via INTRAVENOUS

## 2018-09-15 MED ORDER — DEXAMETHASONE SODIUM PHOSPHATE 10 MG/ML IJ SOLN
INTRAMUSCULAR | Status: AC
  Filled 2018-09-15: qty 1

## 2018-09-15 MED ORDER — MEPERIDINE HCL 50 MG/ML IJ SOLN: 6.2500 mg | INTRAMUSCULAR | Status: DC | PRN

## 2018-09-15 MED ORDER — METHOCARBAMOL 1000 MG/10ML IJ SOLN
500.0000 mg | Freq: Four times a day (QID) | INTRAVENOUS | Status: DC | PRN
  Filled 2018-09-15: qty 5

## 2018-09-15 MED ORDER — VANCOMYCIN HCL 1000 MG IV SOLR
INTRAVENOUS | Status: DC | PRN
  Administered 2018-09-15: 1000 mg via TOPICAL

## 2018-09-15 MED ORDER — HYDROMORPHONE HCL 1 MG/ML IJ SOLN
0.5000 mg | INTRAMUSCULAR | Status: DC | PRN
  Administered 2018-09-15: 1 mg via INTRAVENOUS
  Filled 2018-09-15: qty 1

## 2018-09-15 MED ORDER — DEXMEDETOMIDINE HCL IN NACL 200 MCG/50ML IV SOLN
0.4000 ug/kg/h | INTRAVENOUS | Status: DC
  Administered 2018-09-15: 12 ug via INTRAVENOUS
  Administered 2018-09-15: 4 ug via INTRAVENOUS
  Administered 2018-09-15: 6 ug via INTRAVENOUS
  Administered 2018-09-15: 8 ug via INTRAVENOUS
  Filled 2018-09-15: qty 50

## 2018-09-15 MED ORDER — ALBUMIN HUMAN 5 % IV SOLN
INTRAVENOUS | Status: AC
  Filled 2018-09-15: qty 250

## 2018-09-15 MED ORDER — MIDAZOLAM HCL 2 MG/2ML IJ SOLN
INTRAMUSCULAR | Status: AC
  Filled 2018-09-15: qty 2

## 2018-09-15 MED ORDER — CEFAZOLIN SODIUM-DEXTROSE 2-4 GM/100ML-% IV SOLN: 2.0000 g | Freq: Four times a day (QID) | INTRAVENOUS | Status: DC

## 2018-09-15 MED ORDER — METOCLOPRAMIDE HCL 5 MG PO TABS: 5.0000 mg | ORAL_TABLET | Freq: Three times a day (TID) | ORAL | Status: DC | PRN

## 2018-09-15 MED ORDER — SUCROFERRIC OXYHYDROXIDE 500 MG PO CHEW
500.0000 mg | CHEWABLE_TABLET | Freq: Three times a day (TID) | ORAL | Status: DC | PRN
  Filled 2018-09-15: qty 1

## 2018-09-15 MED ORDER — KETOROLAC TROMETHAMINE 15 MG/ML IJ SOLN
15.0000 mg | Freq: Four times a day (QID) | INTRAMUSCULAR | Status: AC
  Administered 2018-09-15 – 2018-09-16 (×3): 15 mg via INTRAVENOUS
  Filled 2018-09-15 (×3): qty 1

## 2018-09-15 MED ORDER — DIPHENHYDRAMINE HCL 50 MG/ML IJ SOLN
25.0000 mg | Freq: Once | INTRAMUSCULAR | Status: AC
  Administered 2018-09-15: 25 mg via INTRAVENOUS

## 2018-09-15 MED ORDER — FENTANYL CITRATE (PF) 250 MCG/5ML IJ SOLN
INTRAMUSCULAR | Status: AC
  Filled 2018-09-15: qty 5

## 2018-09-15 MED ORDER — LIDOCAINE 2% (20 MG/ML) 5 ML SYRINGE
INTRAMUSCULAR | Status: AC
  Filled 2018-09-15: qty 5

## 2018-09-15 MED ORDER — SODIUM CHLORIDE 0.9 % IV SOLN
INTRAVENOUS | Status: DC
  Administered 2018-09-15 (×2): via INTRAVENOUS

## 2018-09-15 MED ORDER — ONDANSETRON HCL 4 MG PO TABS: 4.0000 mg | ORAL_TABLET | Freq: Three times a day (TID) | ORAL | 0 refills | Status: DC | PRN

## 2018-09-15 MED ORDER — ACETAMINOPHEN 325 MG PO TABS
325.0000 mg | ORAL_TABLET | Freq: Four times a day (QID) | ORAL | Status: DC | PRN
  Administered 2018-09-16: 650 mg via ORAL
  Filled 2018-09-15: qty 2

## 2018-09-15 MED ORDER — DOCUSATE SODIUM 100 MG PO CAPS
100.0000 mg | ORAL_CAPSULE | Freq: Two times a day (BID) | ORAL | Status: DC
  Administered 2018-09-15 – 2018-09-17 (×3): 100 mg via ORAL
  Filled 2018-09-15 (×3): qty 1

## 2018-09-15 MED ORDER — HYDROMORPHONE HCL 1 MG/ML IJ SOLN
INTRAMUSCULAR | Status: AC
  Filled 2018-09-15: qty 1

## 2018-09-15 MED ORDER — RENA-VITE PO TABS
1.0000 | ORAL_TABLET | Freq: Every day | ORAL | Status: DC
  Administered 2018-09-15 – 2018-09-16 (×2): 1 via ORAL
  Filled 2018-09-15 (×2): qty 1

## 2018-09-15 MED ORDER — PHENOL 1.4 % MT LIQD: 1.0000 | OROMUCOSAL | Status: DC | PRN

## 2018-09-15 MED ORDER — METHOCARBAMOL 500 MG PO TABS: 500.0000 mg | ORAL_TABLET | Freq: Four times a day (QID) | ORAL | 2 refills | Status: DC | PRN

## 2018-09-15 MED ORDER — PROMETHAZINE HCL 25 MG/ML IJ SOLN: 6.2500 mg | INTRAMUSCULAR | Status: DC | PRN

## 2018-09-15 MED ORDER — EPHEDRINE SULFATE 50 MG/ML IJ SOLN
INTRAMUSCULAR | Status: DC | PRN
  Administered 2018-09-15: 5 mg via INTRAVENOUS

## 2018-09-15 MED ORDER — VANCOMYCIN HCL 1000 MG IV SOLR
INTRAVENOUS | Status: AC
  Filled 2018-09-15: qty 1000

## 2018-09-15 MED ORDER — TRANEXAMIC ACID-NACL 1000-0.7 MG/100ML-% IV SOLN
1000.0000 mg | Freq: Once | INTRAVENOUS | Status: AC
  Administered 2018-09-15: 1000 mg via INTRAVENOUS
  Filled 2018-09-15: qty 100

## 2018-09-15 MED ORDER — OXYCODONE HCL ER 10 MG PO T12A: 10.0000 mg | EXTENDED_RELEASE_TABLET | Freq: Two times a day (BID) | ORAL | 0 refills | Status: AC

## 2018-09-15 MED ORDER — PROPOFOL 10 MG/ML IV BOLUS
INTRAVENOUS | Status: DC | PRN
  Administered 2018-09-15 (×2): 20 mg via INTRAVENOUS
  Administered 2018-09-15: 10 mg via INTRAVENOUS

## 2018-09-15 MED ORDER — POLYETHYLENE GLYCOL 3350 17 G PO PACK: 17.0000 g | PACK | Freq: Every day | ORAL | Status: DC | PRN

## 2018-09-15 MED ORDER — PHENYLEPHRINE 40 MCG/ML (10ML) SYRINGE FOR IV PUSH (FOR BLOOD PRESSURE SUPPORT)
PREFILLED_SYRINGE | INTRAVENOUS | Status: AC
  Filled 2018-09-15: qty 20

## 2018-09-15 MED ORDER — FENTANYL CITRATE (PF) 100 MCG/2ML IJ SOLN
INTRAMUSCULAR | Status: DC | PRN
  Administered 2018-09-15: 50 ug via INTRAVENOUS

## 2018-09-15 MED ORDER — DIPHENHYDRAMINE HCL 12.5 MG/5ML PO ELIX: 25.0000 mg | ORAL_SOLUTION | ORAL | Status: DC | PRN

## 2018-09-15 MED ORDER — METHOCARBAMOL 500 MG PO TABS
500.0000 mg | ORAL_TABLET | Freq: Four times a day (QID) | ORAL | Status: DC | PRN
  Administered 2018-09-15 – 2018-09-16 (×2): 500 mg via ORAL
  Filled 2018-09-15 (×2): qty 1

## 2018-09-15 MED ORDER — SUCROFERRIC OXYHYDROXIDE 500 MG PO CHEW
1000.0000 mg | CHEWABLE_TABLET | Freq: Three times a day (TID) | ORAL | Status: DC
  Administered 2018-09-15 – 2018-09-17 (×4): 1000 mg via ORAL
  Filled 2018-09-15 (×7): qty 2

## 2018-09-15 MED ORDER — SULFAMETHOXAZOLE-TRIMETHOPRIM 400-80 MG/5ML IV SOLN
2.5000 mg/kg/d | INTRAVENOUS | Status: DC
  Administered 2018-09-15: 256 mg via INTRAVENOUS
  Filled 2018-09-15 (×2): qty 16

## 2018-09-15 MED ORDER — EPHEDRINE 5 MG/ML INJ
INTRAVENOUS | Status: AC
  Filled 2018-09-15: qty 10

## 2018-09-15 MED ORDER — SODIUM CHLORIDE 0.9 % IV SOLN
INTRAVENOUS | Status: DC | PRN
  Administered 2018-09-15: 50 ug/min via INTRAVENOUS

## 2018-09-15 MED ORDER — ASPIRIN EC 81 MG PO TBEC: 81.0000 mg | DELAYED_RELEASE_TABLET | Freq: Two times a day (BID) | ORAL | 0 refills | Status: DC

## 2018-09-15 MED ORDER — 0.9 % SODIUM CHLORIDE (POUR BTL) OPTIME
TOPICAL | Status: DC | PRN
  Administered 2018-09-15: 1000 mL

## 2018-09-15 SURGICAL SUPPLY — 60 items
ADH SKN CLS APL DERMABOND .7 (GAUZE/BANDAGES/DRESSINGS) ×1
BAG DECANTER FOR FLEXI CONT (MISCELLANEOUS) ×2 IMPLANT
CELLS DAT CNTRL 66122 CELL SVR (MISCELLANEOUS) IMPLANT
COVER SURGICAL LIGHT HANDLE (MISCELLANEOUS) ×2 IMPLANT
COVER WAND RF STERILE (DRAPES) ×2 IMPLANT
DERMABOND ADVANCED (GAUZE/BANDAGES/DRESSINGS) ×1
DERMABOND ADVANCED .7 DNX12 (GAUZE/BANDAGES/DRESSINGS) IMPLANT
DRAPE C-ARM 42X72 X-RAY (DRAPES) ×2 IMPLANT
DRAPE POUCH INSTRU U-SHP 10X18 (DRAPES) ×2 IMPLANT
DRAPE STERI IOBAN 125X83 (DRAPES) ×3 IMPLANT
DRAPE U-SHAPE 47X51 STRL (DRAPES) ×4 IMPLANT
DRSG AQUACEL AG ADV 3.5X10 (GAUZE/BANDAGES/DRESSINGS) ×2 IMPLANT
DURAPREP 26ML APPLICATOR (WOUND CARE) ×1 IMPLANT
ELECT BLADE 4.0 EZ CLEAN MEGAD (MISCELLANEOUS) ×2
ELECT REM PT RETURN 9FT ADLT (ELECTROSURGICAL) ×2
ELECTRODE BLDE 4.0 EZ CLN MEGD (MISCELLANEOUS) ×1 IMPLANT
ELECTRODE REM PT RTRN 9FT ADLT (ELECTROSURGICAL) ×1 IMPLANT
GAUZE XEROFORM 1X8 LF (GAUZE/BANDAGES/DRESSINGS) ×1 IMPLANT
GLOVE BIOGEL PI IND STRL 7.0 (GLOVE) ×1 IMPLANT
GLOVE BIOGEL PI INDICATOR 7.0 (GLOVE) ×1
GLOVE ECLIPSE 7.0 STRL STRAW (GLOVE) ×4 IMPLANT
GLOVE SKINSENSE NS SZ7.5 (GLOVE) ×1
GLOVE SKINSENSE STRL SZ7.5 (GLOVE) ×1 IMPLANT
GLOVE SURG SYN 7.5  E (GLOVE) ×4
GLOVE SURG SYN 7.5 E (GLOVE) ×4 IMPLANT
GLOVE SURG SYN 7.5 PF PI (GLOVE) ×4 IMPLANT
GOWN SRG XL XLNG 56XLVL 4 (GOWN DISPOSABLE) ×1 IMPLANT
GOWN STRL NON-REIN XL XLG LVL4 (GOWN DISPOSABLE) ×2
GOWN STRL REUS W/ TWL LRG LVL3 (GOWN DISPOSABLE) IMPLANT
GOWN STRL REUS W/ TWL XL LVL3 (GOWN DISPOSABLE) ×1 IMPLANT
GOWN STRL REUS W/TWL LRG LVL3 (GOWN DISPOSABLE)
GOWN STRL REUS W/TWL XL LVL3 (GOWN DISPOSABLE) ×2
HANDPIECE INTERPULSE COAX TIP (DISPOSABLE) ×2
HEAD CERAMIC 36 PLUS5 (Hips) ×1 IMPLANT
HOOD PEEL AWAY FLYTE STAYCOOL (MISCELLANEOUS) ×4 IMPLANT
IV NS IRRIG 3000ML ARTHROMATIC (IV SOLUTION) ×2 IMPLANT
KIT BASIN OR (CUSTOM PROCEDURE TRAY) ×2 IMPLANT
LINER NEUTRAL 52X36MM PLUS 4 (Liner) ×1 IMPLANT
MARKER SKIN DUAL TIP RULER LAB (MISCELLANEOUS) ×2 IMPLANT
NDL SPNL 18GX3.5 QUINCKE PK (NEEDLE) ×1 IMPLANT
NEEDLE SPNL 18GX3.5 QUINCKE PK (NEEDLE) ×2 IMPLANT
PACK TOTAL JOINT (CUSTOM PROCEDURE TRAY) ×2 IMPLANT
PACK UNIVERSAL I (CUSTOM PROCEDURE TRAY) ×2 IMPLANT
PIN SECTOR W/GRIP ACE CUP 52MM (Hips) ×1 IMPLANT
RETRACTOR WND ALEXIS 18 MED (MISCELLANEOUS) IMPLANT
RTRCTR WOUND ALEXIS 18CM MED (MISCELLANEOUS)
SAW OSC TIP CART 19.5X105X1.3 (SAW) ×2 IMPLANT
SCREW 6.5MMX25MM (Screw) ×1 IMPLANT
SET HNDPC FAN SPRY TIP SCT (DISPOSABLE) ×1 IMPLANT
STAPLER VISISTAT 35W (STAPLE) IMPLANT
STEM FEM ACTIS STD SZ2 (Stem) ×1 IMPLANT
SUT ETHIBOND 2 V 37 (SUTURE) ×2 IMPLANT
SUT VIC AB 1 CT1 27 (SUTURE) ×2
SUT VIC AB 1 CT1 27XBRD ANBCTR (SUTURE) ×1 IMPLANT
SUT VIC AB 2-0 CT1 27 (SUTURE) ×4
SUT VIC AB 2-0 CT1 TAPERPNT 27 (SUTURE) ×2 IMPLANT
SYRINGE 60CC LL (MISCELLANEOUS) ×2 IMPLANT
TOWEL OR 17X26 10 PK STRL BLUE (TOWEL DISPOSABLE) ×2 IMPLANT
TRAY CATH 16FR W/PLASTIC CATH (SET/KITS/TRAYS/PACK) IMPLANT
YANKAUER SUCT BULB TIP NO VENT (SUCTIONS) ×2 IMPLANT

## 2018-09-15 NOTE — Transfer of Care (Signed)
Immediate Anesthesia Transfer of Care Note  Patient: Brenda Arnold  Procedure(s) Performed: RIGHT TOTAL HIP ARTHROPLASTY ANTERIOR APPROACH (Right )  Patient Location: PACU  Anesthesia Type:MAC and Spinal  Level of Consciousness: awake, oriented and patient cooperative  Airway & Oxygen Therapy: Patient Spontanous Breathing and Patient connected to face mask oxygen  Post-op Assessment: Report given to RN and Post -op Vital signs reviewed and stable  Post vital signs: Reviewed  Last Vitals:  Vitals Value Taken Time  BP 94/56 09/15/2018 10:01 AM  Temp    Pulse 83 09/15/2018 10:02 AM  Resp 13 09/15/2018 10:02 AM  SpO2 97 % 09/15/2018 10:02 AM  Vitals shown include unvalidated device data.  Last Pain:  Vitals:   09/15/18 0604  TempSrc:   PainSc: 4          Complications: No apparent anesthesia complications

## 2018-09-15 NOTE — Op Note (Signed)
RIGHT TOTAL HIP ARTHROPLASTY ANTERIOR APPROACH  Procedure Note ALLYE HOYOS   [id number]  Pre-op Diagnosis: right hip degenerative joint disease     Post-op Diagnosis: same   Operative Procedures  1. Total hip replacement; Right hip; uncemented [id number]   Personnel  Surgeon(s): Leandrew Koyanagi, MD  Assist: Madalyn Rob, PA-C; necessary for the timely completion of procedure and due to complexity of procedure.   Anesthesia: spinal  Prosthesis: Depuy Acetabulum: Pinnacle 52 mm Femur: Actis 2 STD Head: 36 mm size: +5 Liner: +4 Bearing Type: ceramic on poly  Total Hip Arthroplasty (Anterior Approach) Op Note:  After informed consent was obtained and the operative extremity marked in the holding area, the patient was brought back to the operating room and placed supine on the HANA table. Next, the operative extremity was prepped and draped in normal sterile fashion. Surgical timeout occurred verifying patient identification, surgical site, surgical procedure and administration of antibiotics.  A modified anterior Smith-Peterson approach to the hip was performed, using the interval between tensor fascia lata and sartorius.  Dissection was carried bluntly down onto the anterior hip capsule. The lateral femoral circumflex vessels were identified and coagulated. A capsulotomy was performed and the capsular flaps tagged for later repair.  Fluoroscopy was utilized to prepare for the femoral neck cut. The neck osteotomy was performed. The femoral head was removed, the acetabular rim was cleared of soft tissue and attention was turned to reaming the acetabulum.  Sequential reaming was performed under fluoroscopic guidance. We reamed to a size 51 mm, and then impacted the acetabular shell. The liner was then placed after irrigation and attention turned to the femur.  After placing the femoral hook, the leg was taken to externally rotated, extended and adducted position taking care to  perform soft tissue releases to allow for adequate mobilization of the femur. Soft tissue was cleared from the shoulder of the greater trochanter and the hook elevator used to improve exposure of the proximal femur. Sequential broaching performed up to a size 2. Trial neck and head were placed. The leg was brought back up to neutral and the construct reduced. The position and sizing of components, offset and leg lengths were checked using fluoroscopy. Stability of the  construct was checked in extension and external rotation without any subluxation or impingement of prosthesis. We dislocated the prosthesis, dropped the leg back into position, removed trial components, and irrigated copiously. The final stem and head was then placed, the leg brought back up, the system reduced and fluoroscopy used to verify positioning.  We irrigated, obtained hemostasis and closed the capsule using #2 ethibond suture.  One gram of vancomycin powder was placed in the surgical bed. The fascia was closed with #1 vicryl plus, the deep fat layer was closed with 0 vicryl, the subcutaneous layers closed with 2.0 Vicryl Plus and the skin closed with 2.0 nylon and dermabond. A sterile dressing was applied. The patient was awakened in the operating room and taken to recovery in stable condition.  All sponge, needle, and instrument counts were correct at the end of the case.   Position: supine  Complications: none.  Time Out: performed   Drains/Packing: none  Estimated blood loss: 200 cc  Returned to Recovery Room: in good condition.   Antibiotics: yes   Mechanical VTE (DVT) Prophylaxis: sequential compression devices, TED thigh-high  Chemical VTE (DVT) Prophylaxis: aspirin   Fluid Replacement: see anesthesia record  Specimens Removed: 1 to pathology   Sponge  and Instrument Count Correct? yes   PACU: portable radiograph - low AP   Admission: inpatient status  Plan/RTC: Return in 2 weeks for suture removal. Weight  Bearing/Load Lower Extremity: full  Hip precautions: none  N. Eduard Roux, MD [place] 9:19 AM     Implant Name Type Inv. Item Serial No. Manufacturer Lot No. LRB No. Used  PIN SECTOR W/GRIP ACE CUP 52MM - [id number] Hips PIN SECTOR W/GRIP ACE CUP 52MM  DEPUY SYNTHES [id number] Right 1  SCREW 6.5MMX25MM - [id number] Screw SCREW 6.5MMX25MM  DEPUY SYNTHES [id number] Right 1  LINER NEUTRAL 52X36MM PLUS 4 - [id number] Liner LINER NEUTRAL 52X36MM PLUS 4  DEPUY SYNTHES [id number] Right 1  STEM FEM ACTIS STD SZ2 - [id number] Stem STEM FEM ACTIS STD SZ2  DEPUY SYNTHES [id number] Right 1  HEAD CERAMIC 36 PLUS5 - [id number] Hips HEAD CERAMIC 36 PLUS5  DEPUY SYNTHES [id number] Right 1

## 2018-09-15 NOTE — Anesthesia Postprocedure Evaluation (Signed)
Anesthesia Post Note  Patient: Brenda Arnold  Procedure(s) Performed: RIGHT TOTAL HIP ARTHROPLASTY ANTERIOR APPROACH (Right )     Patient location during evaluation: PACU Anesthesia Type: Spinal Level of consciousness: oriented and awake and alert Pain management: pain level controlled Vital Signs Assessment: post-procedure vital signs reviewed and stable Respiratory status: spontaneous breathing, respiratory function stable and patient connected to nasal cannula oxygen Cardiovascular status: blood pressure returned to baseline and stable Postop Assessment: no headache, no backache and no apparent nausea or vomiting Anesthetic complications: no Comments: Pt extremely agitated with sensation of tingling of the feet and itching of the feet which required precedex and benadryl administration. Vitals stable throughout.      Last Vitals:  Vitals:   09/15/18 1350 09/15/18 2001  BP: 115/60 (!) 91/50  Pulse: 85 79  Resp: 16 14  Temp: 36.6 C 36.7 C  SpO2: 100% 100%    Last Pain:  Vitals:   09/15/18 2001  TempSrc: Oral  PainSc:                  Effie Berkshire

## 2018-09-15 NOTE — Anesthesia Procedure Notes (Signed)
Procedure Name: MAC Date/Time: 09/15/2018 7:40 AM Performed by: Jenne Campus, CRNA Pre-anesthesia Checklist: Patient identified, Emergency Drugs available, Suction available and Patient being monitored Oxygen Delivery Method: Simple face mask

## 2018-09-15 NOTE — H&P (Signed)
PREOPERATIVE H&P  Chief Complaint: right hip degenerative joint disease  HPI: Brenda Arnold is a 62 y.o. female who presents for surgical treatment of right hip degenerative joint disease.  She denies any changes in medical history.  Past Medical History:  Diagnosis Date  . Anemia   . Arthritis   . Asthma   . CKD (chronic kidney disease), stage IV ([place])   . Complication of anesthesia    difficulty with getting oxygen saturation up  . Diabetes mellitus   . Dyslipidemia   . Epistaxis 11/05/2012  . History of nuclear stress test    Myoview 5/18: EF 68, no infarct or ischemia, low risk  . HTN (hypertension)   . Hyperlipidemia   . Hyperparathyroidism due to renal insufficiency ([address])   . Obesity    s/p panniculectomy  . Paroxysmal A-fib (HCC)    a. chronic coumadin;  b. 12/2009 Echo: EF 60-65%, Gr 1 DD.  Marland Kitchen PCOS (polycystic ovarian syndrome)   . Pneumonia   . Seasonal allergies   . Sleep apnea    a. not using CPAP, last study  >8 yrs  . Vitamin D deficiency    Past Surgical History:  Procedure Laterality Date  . ABDOMINAL HYSTERECTOMY     with panniculctomy  . AV FISTULA PLACEMENT  09/02/2012   Procedure: ARTERIOVENOUS (AV) FISTULA CREATION;  Surgeon: Elam Dutch, MD;  Location[facility] OR;  Service: Vascular;  Laterality: Left;  Creation of Left Radial-Cephalic Fistula   . BREAST SURGERY     Biopsy right breast  . COLONOSCOPY W/ BIOPSIES AND POLYPECTOMY    . DILATION AND CURETTAGE OF UTERUS    . hysterectomy (other)    . REVISON OF ARTERIOVENOUS FISTULA Left 04/27/2014   Procedure: REVISON OF LEFT RADIAL-CEPHALIC ARTERIOVENOUS FISTULA;  Surgeon: Mal Misty, MD;  Location: [place];  Service: Vascular;  Laterality: Left;  . right knee surgery    . uvuloplasty     Social History   Socioeconomic History  . Marital status: Single    Spouse name: Not on file  . Number of children: Not on file  . Years of education: Not on file  . Highest education level: Not on  file  Occupational History  . Not on file  Social Needs  . Financial resource strain: Not on file  . Food insecurity:    Worry: Not on file    Inability: Not on file  . Transportation needs:    Medical: Not on file    Non-medical: Not on file  Tobacco Use  . Smoking status: Never Smoker  . Smokeless tobacco: Never Used  Substance and Sexual Activity  . Alcohol use: No    Alcohol/week: 0.0 standard drinks  . Drug use: No  . Sexual activity: Not on file  Lifestyle  . Physical activity:    Days per week: Not on file    Minutes per session: Not on file  . Stress: Not on file  Relationships  . Social connections:    Talks on phone: Not on file    Gets together: Not on file    Attends religious service: Not on file    Active member of club or organization: Not on file    Attends meetings of clubs or organizations: Not on file    Relationship status: Not on file  Other Topics Concern  . Not on file  Social History Narrative   Lives in [place] alone. 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   Family History  Problem Relation Age of Onset  . Lung cancer Father        died @ 65  . Hypertension Mother        alive @ 35  . Diabetes Brother   . Kidney disease Brother    Allergies  Allergen Reactions  . Iodine Shortness Of Breath  . Shellfish Allergy Shortness Of Breath  . Norvasc [Amlodipine] Swelling    SWELLING REACTION UNSPECIFIED    Prior to Admission medications   Medication Sig Start Date End Date Taking? Authorizing Provider  B Complex-C-Folic Acid (DIALYVITE TABLET) TABS Take 1 tablet by mouth at bedtime.  06/03/18  Yes [provider]  ethyl chloride spray Apply 1 application topically See admin instructions. For dialysis 06/25/18  Yes [provider]  gabapentin (NEURONTIN) 100 MG capsule Take 100 mg by mouth at bedtime. 05/14/17  Yes [provider]  sucroferric oxyhydroxide (VELPHORO) 500 MG chewable tablet Chew 2  capsules by mouth See admin instructions. 2 each three times daily with meals, and 2 for 1 daily snack   Yes [provider]  aspirin EC 81 MG tablet Take 1 tablet (81 mg total) by mouth 2 (two) times daily. 09/15/18   Leandrew Koyanagi, MD  HYDROcodone-acetaminophen (NORCO/VICODIN) 5-325 MG tablet Take 1 tablet by mouth 3 (three) times daily as needed (pain).  05/16/17   [provider]  methocarbamol (ROBAXIN) 500 MG tablet Take 1 tablet (500 mg total) by mouth every 6 (six) hours as needed for muscle spasms. 09/15/18   Leandrew Koyanagi, MD  ondansetron (ZOFRAN) 4 MG tablet Take 1-2 tablets (4-8 mg total) by mouth every 8 (eight) hours as needed for nausea or vomiting. 09/15/18   Leandrew Koyanagi, MD  oxyCODONE (OXY IR/ROXICODONE) 5 MG immediate release tablet Take 1-3 tablets (5-15 mg total) by mouth every 4 (four) hours as needed. 09/15/18   Leandrew Koyanagi, MD  oxyCODONE (OXYCONTIN) 10 mg 12 hr tablet Take 1 tablet (10 mg total) by mouth every 12 (twelve) hours for 3 days. 09/15/18 09/18/18  Leandrew Koyanagi, MD  promethazine (PHENERGAN) 25 MG tablet Take 1 tablet (25 mg total) by mouth every 6 (six) hours as needed for nausea. 09/15/18   Leandrew Koyanagi, MD  senna-docusate (SENOKOT S) 8.6-50 MG tablet Take 1 tablet by mouth at bedtime as needed. 09/15/18   Leandrew Koyanagi, MD     Positive ROS: All other systems have been reviewed and were otherwise negative with the exception of those mentioned in the HPI and as above.  Physical Exam: General: Alert, no acute distress Cardiovascular: No pedal edema Respiratory: No cyanosis, no use of accessory musculature GI: abdomen soft Skin: No lesions in the area of chief complaint Neurologic: Sensation intact distally Psychiatric: Patient is competent for consent with normal mood and affect Lymphatic: no lymphedema  MUSCULOSKELETAL: exam stable  Assessment: right hip degenerative joint disease  Plan: Plan for Procedure(s): RIGHT TOTAL HIP  ARTHROPLASTY ANTERIOR APPROACH  The risks benefits and alternatives were discussed with the patient including but not limited to the risks of nonoperative treatment, versus surgical intervention including infection, bleeding, nerve injury,  blood clots, cardiopulmonary complications, morbidity, mortality, among others, and they were willing to proceed.   Preoperative templating of the joint replacement has been completed, documented, and submitted to the Operating Room personnel in order to optimize intra-operative equipment management.  Eduard Roux, MD   09/15/2018 9:41 AM

## 2018-09-15 NOTE — Progress Notes (Signed)
Pharmacy Antibiotic Note  Brenda Arnold is a 62 y.o. female admitted on 09/15/2018 with THR.  Pharmacy has been consulted for septra dosing. Pt is afebrile, baseline WBC is WNL. Pt with history of ESRD on HD.   Plan: Septra 2.5mg /kg IV Q24H F/u renal plans, LOT Clarify indication     Temp (24hrs), Avg:97.9 F (36.6 C), Min:97.3 F (36.3 C), Max:99 F (37.2 C)  No results for input(s): WBC, CREATININE, LATICACIDVEN, VANCOTROUGH, VANCOPEAK, VANCORANDOM, GENTTROUGH, GENTPEAK, GENTRANDOM, TOBRATROUGH, TOBRAPEAK, TOBRARND, AMIKACINPEAK, AMIKACINTROU, AMIKACIN in the last 168 hours.  Estimated Creatinine Clearance: 9.7 mL/min (A) (by C-G formula based on SCr of 6.93 mg/dL (H)).    Allergies  Allergen Reactions  . Iodine Shortness Of Breath  . Shellfish Allergy Shortness Of Breath  . Norvasc [Amlodipine] Swelling    SWELLING REACTION UNSPECIFIED    Thank you for allowing pharmacy to be a part of this patient's care.  Lewis Grivas, Rande Lawman 09/15/2018 2:57 PM

## 2018-09-15 NOTE — Anesthesia Preprocedure Evaluation (Addendum)
Anesthesia Evaluation  Patient identified by MRN, date of birth, ID band Patient awake    Reviewed: Allergy & Precautions, NPO status , Patient's Chart, lab work & pertinent test results  Airway Mallampati: I  TM Distance: >3 FB Neck ROM: Full    Dental  (+) Teeth Intact, Dental Advisory Given   Pulmonary asthma , sleep apnea ,    breath sounds clear to auscultation       Cardiovascular hypertension, Pt. on medications + dysrhythmias Atrial Fibrillation  Rhythm:Regular Rate:Normal     Neuro/Psych negative neurological ROS  negative psych ROS   GI/Hepatic negative GI ROS, Neg liver ROS,   Endo/Other  diabetes, Type 2  Renal/GU CRF and DialysisRenal diseaseLast HD 10/14     Musculoskeletal  (+) Arthritis , Osteoarthritis,    Abdominal (+) + obese,   Peds  Hematology   Anesthesia Other Findings - HLD  Reproductive/Obstetrics                           Anesthesia Physical Anesthesia Plan  ASA: III  Anesthesia Plan: Spinal   Post-op Pain Management:    Induction: Intravenous  PONV Risk Score and Plan: 3  Airway Management Planned: Simple Face Mask  Additional Equipment: None  Intra-op Plan:   Post-operative Plan:   Informed Consent: I have reviewed the patients History and Physical, chart, labs and discussed the procedure including the risks, benefits and alternatives for the proposed anesthesia with the patient or authorized representative who has indicated his/her understanding and acceptance.     Plan Discussed with: CRNA  Anesthesia Plan Comments:        Anesthesia Quick Evaluation

## 2018-09-15 NOTE — Progress Notes (Signed)
PHARMACY NOTE:  ANTIMICROBIAL RENAL DOSAGE ADJUSTMENT  Current antimicrobial regimen includes a mismatch between antimicrobial dosage and estimated renal function.  As per policy approved by the Pharmacy & Therapeutics and Medical Executive Committees, the antimicrobial dosage will be adjusted accordingly.  Current antimicrobial dosage:  Ancef q6 x3   Indication: post op prophylaxis  Renal Function: CKD 4  Estimated Creatinine Clearance: 9.7 mL/min (A) (by C-G formula based on SCr of 6.93 mg/dL (H)). []      On intermittent HD, scheduled: []      On CRRT    Antimicrobial dosage has been changed to:  Pt got pre-op dose. No further post op doses needed  Additional comments:   Onnie Boer, PharmD, BCIDP, AAHIVP, CPP Infectious Disease Pharmacist Pager: [phone] 09/15/2018 1:58 PM

## 2018-09-15 NOTE — Evaluation (Addendum)
Physical Therapy Evaluation Patient Details Name: Brenda Arnold MRN: [id number] DOB: 01/09/1956 Today's Date: 09/15/2018   History of Present Illness  Pt is a 62 y/o female s/p elective R THA, direct anterior. PMH includes asthma, CKD, DM, HTN, a fib, sleep apnea, and s/p L AV fistula.   Clinical Impression  Pt s/p surgery above with deficits below. Pt very limited by pain this session and only able to tolerate gait to chair using RW. Required min to mod A for mobility using RW. Educated about HEP, however, pt with poor tolerance for exercises as well. Will continue to follow acutely to maximize functional mobility independence and safety.     Follow Up Recommendations Follow surgeon's recommendation for DC plan and follow-up therapies;Supervision for mobility/OOB    Equipment Recommendations  Rolling walker with 5" wheels;3in1 (PT)    Recommendations for Other Services OT consult     Precautions / Restrictions Precautions Precautions: Fall Restrictions Weight Bearing Restrictions: Yes RLE Weight Bearing: Weight bearing as tolerated      Mobility  Bed Mobility Overal bed mobility: Needs Assistance Bed Mobility: Supine to Sit     Supine to sit: Min assist     General bed mobility comments: Min A for RLE assist. Increased time to perform and pt very guarded throughout.   Transfers Overall transfer level: Needs assistance Equipment used: Rolling walker (2 wheeled) Transfers: Sit to/from Stand Sit to Stand: Mod assist         General transfer comment: Mod A for lift assist and steadying. Increased time required to perform. Verbal cues for safe hand placement.   Ambulation/Gait Ambulation/Gait assistance: Min assist Gait Distance (Feet): 5 Feet Assistive device: Rolling walker (2 wheeled) Gait Pattern/deviations: Step-to pattern;Decreased step length - right;Decreased step length - left;Decreased weight shift to right;Antalgic Gait velocity: Decreased     General Gait Details: Slow, very antalgic gait. Pt required assist to move RLE secondary to increased pain. Pt required cues for sequencing using RW, proximity to device and upright posture. Gait distance limited to chair secondary to pain.   Stairs            Wheelchair Mobility    Modified Rankin (Stroke Patients Only)       Balance Overall balance assessment: Needs assistance Sitting-balance support: No upper extremity supported;Feet supported Sitting balance-Leahy Scale: Fair     Standing balance support: Bilateral upper extremity supported;During functional activity Standing balance-Leahy Scale: Poor Standing balance comment: Heavily reliant on UE support                              Pertinent Vitals/Pain Pain Assessment: 0-10 Pain Score: 7  Pain Location: R hip  Pain Descriptors / Indicators: Aching;Operative site guarding Pain Intervention(s): Limited activity within patient's tolerance;Monitored during session;Repositioned    Home Living Family/patient expects to be discharged to:: Private residence Living Arrangements: Alone Available Help at Discharge: Family;Available 24 hours/day Type of Home: House Home Access: Stairs to enter Entrance Stairs-Rails: None Entrance Stairs-Number of Steps: 2 Home Layout: One level Home Equipment: Walker - 2 wheels;Tub bench      Prior Function Level of Independence: Independent with assistive device(s)         Comments: Reports she used RW for ambulation, however, pt reports she needs new RW.      Hand Dominance        Extremity/Trunk Assessment   Upper Extremity Assessment Upper Extremity Assessment: Defer to OT evaluation  Lower Extremity Assessment Lower Extremity Assessment: RLE deficits/detail RLE Deficits / Details: Limited tolerance for ther ex secondary to pain. Deficits consistent with post op pain and weakness.     Cervical / Trunk Assessment Cervical / Trunk Assessment: Normal   Communication   Communication: No difficulties  Cognition Arousal/Alertness: Awake/alert Behavior During Therapy: WFL for tasks assessed/performed Overall Cognitive Status: Within Functional Limits for tasks assessed                                        General Comments General comments (skin integrity, edema, etc.): Pt's mother present during session.     Exercises Total Joint Exercises Ankle Circles/Pumps: AROM;Both;20 reps Quad Sets: AROM;Right;10 reps   Assessment/Plan    PT Assessment Patient needs continued PT services  PT Problem List Decreased strength;Decreased range of motion;Decreased activity tolerance;Decreased balance;Decreased mobility;Decreased knowledge of use of DME;Decreased knowledge of precautions;Pain       PT Treatment Interventions DME instruction;Gait training;Stair training;Functional mobility training;Therapeutic activities;Therapeutic exercise;Balance training;Patient/family education    PT Goals (Current goals can be found in the Care Plan section)  Acute Rehab PT Goals Patient Stated Goal: for the pain to get better PT Goal Formulation: With patient Time For Goal Achievement: 09/29/18 Potential to Achieve Goals: Fair    Frequency 7X/week   Barriers to discharge        Co-evaluation               AM-PAC PT "6 Clicks" Daily Activity  Outcome Measure Difficulty turning over in bed (including adjusting bedclothes, sheets and blankets)?: A Lot Difficulty moving from lying on back to sitting on the side of the bed? : Unable Difficulty sitting down on and standing up from a chair with arms (e.g., wheelchair, bedside commode, etc,.)?: Unable Help needed moving to and from a bed to chair (including a wheelchair)?: A Little Help needed walking in hospital room?: A Little Help needed climbing 3-5 steps with a railing? : A Lot 6 Click Score: 12    End of Session Equipment Utilized During Treatment: Gait belt Activity  Tolerance: Patient limited by pain Patient left: in chair;with call bell/phone within reach;with family/visitor present Nurse Communication: Mobility status;Patient requests pain meds PT Visit Diagnosis: Unsteadiness on feet (R26.81);Muscle weakness (generalized) (M62.81);Pain Pain - Right/Left: Right Pain - part of body: Hip    Time: 6811-5726 PT Time Calculation (min) (ACUTE ONLY): 19 min   Charges:   PT Evaluation $PT Eval Low Complexity: [place], PT, DPT  Acute Rehabilitation Services  Pager: [phone] Office: [phone]   Rudean Hitt 09/15/2018, 4:11 PM

## 2018-09-15 NOTE — Discharge Instructions (Signed)

## 2018-09-15 NOTE — Consult Note (Addendum)
[facility] Renal Consultation Note    Indication for Consultation:  Management of ESRD/hemodialysis; anemia, hypertension/volume and secondary hyperparathyroidism  IPJ:ASNKNLZ, Margaretha Sheffield, MD  HPI: Brenda Arnold is a 62 y.o. female. ESRD 2/2 DM/HTN on HD MWF at [facility], first starting on 02/13/17.  Past medical history significant for HTN, Type 2 DM, Atrial fibrillation (not on anticoagulation), hyperlipidemia, OSA, OA and morbid obesity.    Patient has been admitted following elective total R Hip replacement by Dr. Erlinda Hong.  Seen and examined at bedside post surgery.  Reports pain well controlled.  Denies SOB, CP, n/v/d, abdominal pain, weakness and dizziness.  Brenda Arnold is compliant with prescribed dialysis regimen and completed her last treatment yesterday.      Past Medical History:  Diagnosis Date  . Anemia   . Arthritis   . Asthma   . Complication of anesthesia    difficulty with getting oxygen saturation up  . Diabetes mellitus   . Dyslipidemia   . Epistaxis 11/05/2012  . ESRD (end stage renal disease) on dialysis [facility])    "MWF; [facility]" (09/15/2018)  . History of nuclear stress test    Myoview 5/18: EF 68, no infarct or ischemia, low risk  . HTN (hypertension)   . Hyperlipidemia   . Hyperparathyroidism due to renal insufficiency ([place])   . Obesity    s/p panniculectomy  . Paroxysmal A-fib (HCC)    a. chronic coumadin;  b. 12/2009 Echo: EF 60-65%, Gr 1 DD.  Marland Kitchen PCOS (polycystic ovarian syndrome)   . Pneumonia   . Seasonal allergies   . Sleep apnea    a. not using CPAP, last study  >8 yrs  . Vitamin D deficiency    Past Surgical History:  Procedure Laterality Date  . ABDOMINAL HYSTERECTOMY     with panniculctomy  . AV FISTULA PLACEMENT  09/02/2012   Procedure: ARTERIOVENOUS (AV) FISTULA CREATION;  Surgeon: Elam Dutch, MD;  Location: [facility] OR;  Service: Vascular;  Laterality: Left;  Creation of Left Radial-Cephalic Fistula   . BREAST SURGERY     Biopsy  right breast  . COLONOSCOPY W/ BIOPSIES AND POLYPECTOMY    . DILATION AND CURETTAGE OF UTERUS    . KNEE ARTHROSCOPY Right   . REVISON OF ARTERIOVENOUS FISTULA Left 04/27/2014   Procedure: REVISON OF LEFT RADIAL-CEPHALIC ARTERIOVENOUS FISTULA;  Surgeon: Mal Misty, MD;  Location: [place];  Service: Vascular;  Laterality: Left;  . TOTAL HIP ARTHROPLASTY Right 09/15/2018  . UVULOPLASTY     Family History  Problem Relation Age of Onset  . Lung cancer Father        died @ 63  . Hypertension Mother        alive @ 61  . Diabetes Brother   . Kidney disease Brother    Social History:  reports that she has never smoked. She has never used smokeless tobacco. She reports that she does not drink alcohol or use drugs. Allergies  Allergen Reactions  . Iodine Shortness Of Breath  . Shellfish Allergy Shortness Of Breath  . Norvasc [Amlodipine] Swelling    SWELLING REACTION UNSPECIFIED    Prior to Admission medications   Medication Sig Start Date End Date Taking? Authorizing Provider  B Complex-C-Folic Acid (DIALYVITE TABLET) TABS Take 1 tablet by mouth at bedtime.  06/03/18  Yes [provider]  ethyl chloride spray Apply 1 application topically See admin instructions. For dialysis 06/25/18  Yes [provider]  gabapentin (NEURONTIN) 100 MG capsule  Take 100 mg by mouth at bedtime. 05/14/17  Yes [provider]  sucroferric oxyhydroxide (VELPHORO) 500 MG chewable tablet Chew 2 capsules by mouth See admin instructions. 2 each three times daily with meals, and 2 for 1 daily snack   Yes [provider]  aspirin EC 81 MG tablet Take 1 tablet (81 mg total) by mouth 2 (two) times daily. 09/15/18   Leandrew Koyanagi, MD  HYDROcodone-acetaminophen (NORCO/VICODIN) 5-325 MG tablet Take 1 tablet by mouth 3 (three) times daily as needed (pain).  05/16/17   [provider]  methocarbamol (ROBAXIN) 500 MG tablet Take 1 tablet (500 mg total) by mouth every 6 (six) hours as  needed for muscle spasms. 09/15/18   Leandrew Koyanagi, MD  ondansetron (ZOFRAN) 4 MG tablet Take 1-2 tablets (4-8 mg total) by mouth every 8 (eight) hours as needed for nausea or vomiting. 09/15/18   Leandrew Koyanagi, MD  oxyCODONE (OXY IR/ROXICODONE) 5 MG immediate release tablet Take 1-3 tablets (5-15 mg total) by mouth every 4 (four) hours as needed. 09/15/18   Leandrew Koyanagi, MD  oxyCODONE (OXYCONTIN) 10 mg 12 hr tablet Take 1 tablet (10 mg total) by mouth every 12 (twelve) hours for 3 days. 09/15/18 09/18/18  Leandrew Koyanagi, MD  promethazine (PHENERGAN) 25 MG tablet Take 1 tablet (25 mg total) by mouth every 6 (six) hours as needed for nausea. 09/15/18   Leandrew Koyanagi, MD  senna-docusate (SENOKOT S) 8.6-50 MG tablet Take 1 tablet by mouth at bedtime as needed. 09/15/18   Leandrew Koyanagi, MD   Current Facility-Administered Medications  Medication Dose Route Frequency Provider Last Rate Last Dose  . 0.9 %  sodium chloride infusion   Intravenous Continuous Leandrew Koyanagi, MD 75 mL/hr at 09/15/18 1424    . acetaminophen (TYLENOL) tablet 1,000 mg  1,000 mg Oral Q6H Leandrew Koyanagi, MD   1,000 mg at 09/15/18 1424  . [START ON 09/16/2018] acetaminophen (TYLENOL) tablet 325-650 mg  325-650 mg Oral Q6H PRN Leandrew Koyanagi, MD      . albumin human 5 % solution           . alum & mag hydroxide-simeth (MAALOX/MYLANTA) 200-200-20 MG/5ML suspension 30 mL  30 mL Oral Q4H PRN Leandrew Koyanagi, MD      . aspirin chewable tablet 81 mg  81 mg Oral BID Leandrew Koyanagi, MD      . Derrill Memo ON 09/16/2018] dexamethasone (DECADRON) injection 10 mg  10 mg Intravenous Once Leandrew Koyanagi, MD      . diphenhydrAMINE (BENADRYL) 12.5 MG/5ML elixir 25 mg  25 mg Oral Q4H PRN Leandrew Koyanagi, MD      . diphenhydrAMINE (BENADRYL) 50 MG/ML injection           . docusate sodium (COLACE) capsule 100 mg  100 mg Oral BID Leandrew Koyanagi, MD      . gabapentin (NEURONTIN) capsule 100 mg  100 mg Oral QHS Leandrew Koyanagi, MD      . HYDROmorphone (DILAUDID) 1  MG/ML injection           . HYDROmorphone (DILAUDID) injection 0.5-1 mg  0.5-1 mg Intravenous Q4H PRN Leandrew Koyanagi, MD   1 mg at 09/15/18 1424  . ketorolac (TORADOL) 15 MG/ML injection 15 mg  15 mg Intravenous Q6H Leandrew Koyanagi, MD   15 mg at 09/15/18 1629  . magnesium citrate solution 1 Bottle  1 Bottle Oral Once  PRN Leandrew Koyanagi, MD      . menthol-cetylpyridinium (CEPACOL) lozenge 3 mg  1 lozenge Oral PRN Leandrew Koyanagi, MD       Or  . phenol (CHLORASEPTIC) mouth spray 1 spray  1 spray Mouth/Throat PRN Leandrew Koyanagi, MD      . methocarbamol (ROBAXIN) tablet 500 mg  500 mg Oral Q6H PRN Leandrew Koyanagi, MD   500 mg at 09/15/18 1425   Or  . methocarbamol (ROBAXIN) 500 mg in dextrose 5 % 50 mL IVPB  500 mg Intravenous Q6H PRN Leandrew Koyanagi, MD      . metoCLOPramide (REGLAN) tablet 5-10 mg  5-10 mg Oral Q8H PRN Leandrew Koyanagi, MD       Or  . metoCLOPramide (REGLAN) injection 5-10 mg  5-10 mg Intravenous Q8H PRN Leandrew Koyanagi, MD      . multivitamin (RENA-VIT) tablet 1 tablet  1 tablet Oral QHS Leandrew Koyanagi, MD      . ondansetron [facility]) tablet 4 mg  4 mg Oral Q6H PRN Leandrew Koyanagi, MD       Or  . ondansetron [facility]) injection 4 mg  4 mg Intravenous Q6H PRN Leandrew Koyanagi, MD      . oxyCODONE (Oxy IR/ROXICODONE) 5 MG immediate release tablet           . oxyCODONE (Oxy IR/ROXICODONE) immediate release tablet 10-15 mg  10-15 mg Oral Q4H PRN Leandrew Koyanagi, MD   10 mg at 09/15/18 1629  . oxyCODONE (Oxy IR/ROXICODONE) immediate release tablet 5-10 mg  5-10 mg Oral Q4H PRN Leandrew Koyanagi, MD   5 mg at 09/15/18 1255  . oxyCODONE (OXYCONTIN) 12 hr tablet 10 mg  10 mg Oral Q12H Xu, Marylynn Pearson, MD      . polyethylene glycol (MIRALAX / GLYCOLAX) packet 17 g  17 g Oral Daily PRN Leandrew Koyanagi, MD      . sorbitol 70 % solution 30 mL  30 mL Oral Daily PRN Leandrew Koyanagi, MD      . sucroferric oxyhydroxide [facility]) chewable tablet 1,000 mg  1,000 mg Oral TID WC Leandrew Koyanagi, MD   1,000 mg at 09/15/18 1629  .  sucroferric oxyhydroxide (VELPHORO) chewable tablet 500 mg  500 mg Oral TID PRN Leandrew Koyanagi, MD      . sulfamethoxazole-trimethoprim (BACTRIM) 256 mg of trimethoprim in dextrose 5 % 250 mL IVPB  2.5 mg/kg/day of trimethoprim Intravenous Q24H Rumbarger, Valeda Malm, RPH       Labs: Basic Metabolic Panel: Recent Labs  Lab 09/15/18 0621  NA 138  K 4.2  GLUCOSE 79   Liver Function Tests: No results for input(s): AST, ALT, ALKPHOS, BILITOT, PROT, ALBUMIN in the last 168 hours. No results for input(s): LIPASE, AMYLASE in the last 168 hours. No results for input(s): AMMONIA in the last 168 hours. CBC: Recent Labs  Lab 09/15/18 0621  HGB 12.2  HCT 36.0   CBG: Recent Labs  Lab 09/15/18 0554 09/15/18 0959  GLUCAP 76 85   Studies/Results: Dg Pelvis Portable  Result Date: 09/15/2018 CLINICAL DATA:  Post right hip replacement EXAM: PORTABLE PELVIS 1-2 VIEWS COMPARISON:  04/02/2018 FINDINGS: Changes of right hip replacement noted. Normal AP alignment. No visible hardware or bony complicating feature. Advanced osteoarthritic changes within the left hip. IMPRESSION: Right hip replacement.  No visible complicating feature. Electronically Signed   By: Rolm Baptise M.D.   On: 09/15/2018 10:15  Dg C-arm 1-60 Min  Result Date: 09/15/2018 CLINICAL DATA:  Hip surgery. EXAM: OPERATIVE RIGHT HIP (WITH PELVIS IF PERFORMED) 2 VIEWS TECHNIQUE: Fluoroscopic spot image(s) were submitted for interpretation post-operatively. COMPARISON:  04/02/2018. FINDINGS: Total right hip replacement with anatomic alignment. Hardware intact. No acute bony abnormality. IMPRESSION: Total right hip replacement anatomic alignment. No acute bony abnormality. Electronically Signed   By: Marcello Moores  Register   On: 09/15/2018 09:36   Dg Hip Operative Unilat With Pelvis Right  Result Date: 09/15/2018 CLINICAL DATA:  Hip surgery. EXAM: OPERATIVE RIGHT HIP (WITH PELVIS IF PERFORMED) 2 VIEWS TECHNIQUE: Fluoroscopic spot image(s) were  submitted for interpretation post-operatively. COMPARISON:  04/02/2018. FINDINGS: Total right hip replacement with anatomic alignment. Hardware intact. No acute bony abnormality. IMPRESSION: Total right hip replacement anatomic alignment. No acute bony abnormality. Electronically Signed   By: Marcello Moores  Register   On: 09/15/2018 09:36    ROS: All others negative except those listed in HPI.  Physical Exam: Vitals:   09/15/18 1228 09/15/18 1230 09/15/18 1300 09/15/18 1350  BP: (!) 102/58  109/60 115/60  Pulse: 90 86 83 85  Resp: 17 17 15 16   Temp:   (!) 97.3 F (36.3 C) 97.9 F (36.6 C)  TempSrc:    Oral  SpO2: 99% 100% 99% 100%     General: WDWN, NAD, obese female Head: NCAT sclera not icteric MMM Neck: Supple. No lymphadenopathy Lungs: CTA bilaterally. No wheeze, rales or rhonchi. Breathing is unlabored. Heart: RRR. No murmur, rubs or gallops.  Abdomen: soft, obese, nontender, +BS, no guarding, no rebound tenderness Lower extremities:no edema, ischemic changes, or open wounds  Neuro: AAOx3. Moves all extremities spontaneously. Psych:  Responds to questions appropriately with a normal affect. Dialysis Access: LU AVF +b/t  Dialysis Orders:  MWF - [facility]  4hrs, BFR 450, DFR 800,  EDW 101.5kg, 2K/ 2Ca  Access: LU AVF  Heparin 8000 units & 3000 unit mid run Mircera none  Calcitriol 1 mcg PO qHD parsabiv 12.5mg  qHD  Assessment/Plan: 1.  R hip degenerative joint disease - s/p total hip replacement on R by Dr. Erlinda Hong. 2.  ESRD -  On HD MWF. K 4.2. Orders written for HD tomorrow per regular schedule. No Hep d/t surgery today. 3.  Hypertension/volume  - BP well controlled. Does not appear volume overloaded on exam. Titrate down as tolerated.  4.  Anemia of CKD - Hgb 12.2. Follow trends post surgery.  No indication for ESA at this time. 5.  Secondary Hyperparathyroidism -  Ca ok. Check phos pre HD.  Continue VDRA and binders. 6.  Nutrition - Renal diet with fluid restrictions, renavite 7. A  fib - per primary   Jen Mow, PA-C [facility] Pager: [phone] 09/15/2018, 4:46 PM   Pt seen, examined and agree w A/P as above.  Kelly Splinter MD [employer] pager [phone]   09/15/2018, 5:25 PM

## 2018-09-16 ENCOUNTER — Encounter (HOSPITAL_COMMUNITY): Payer: Self-pay | Admitting: Orthopaedic Surgery

## 2018-09-16 LAB — CBC
HCT: 28 % — ABNORMAL LOW (ref 36.0–46.0)
Hemoglobin: 8.9 g/dL — ABNORMAL LOW (ref 12.0–15.0)
MCH: 31.4 pg (ref 26.0–34.0)
MCHC: 31.8 g/dL (ref 30.0–36.0)
MCV: 98.9 fL (ref 80.0–100.0)
Platelets: 125 10*3/uL — ABNORMAL LOW (ref 150–400)
RBC: 2.83 MIL/uL — ABNORMAL LOW (ref 3.87–5.11)
RDW: 14.4 % (ref 11.5–15.5)
WBC: 8.7 10*3/uL (ref 4.0–10.5)
nRBC: 0 % (ref 0.0–0.2)

## 2018-09-16 LAB — BASIC METABOLIC PANEL
Anion gap: 11 (ref 5–15)
BUN: 43 mg/dL — ABNORMAL HIGH (ref 8–23)
CO2: 28 mmol/L (ref 22–32)
Chloride: 97 mmol/L — ABNORMAL LOW (ref 98–111)
Creatinine, Ser: 7.52 mg/dL — ABNORMAL HIGH (ref 0.44–1.00)
GFR calc Af Amer: 6 mL/min — ABNORMAL LOW (ref >60)
Potassium: 5 mmol/L (ref 3.5–5.1)
Sodium: 136 mmol/L (ref 135–145)

## 2018-09-16 LAB — BASIC METABOLIC PANEL WITH GFR
Calcium: 8 mg/dL — ABNORMAL LOW (ref 8.9–10.3)
GFR calc non Af Amer: 5 mL/min — ABNORMAL LOW (ref >60)
Glucose, Bld: 98 mg/dL (ref 70–99)

## 2018-09-16 LAB — PHOSPHORUS: Phosphorus: 5.9 mg/dL — ABNORMAL HIGH (ref 2.5–4.6)

## 2018-09-16 MED ORDER — SODIUM CHLORIDE 0.9 % IV SOLN: 100.0000 mL | INTRAVENOUS | Status: DC | PRN

## 2018-09-16 MED ORDER — PENTAFLUOROPROP-TETRAFLUOROETH EX AERO: 1.0000 | INHALATION_SPRAY | CUTANEOUS | Status: DC | PRN

## 2018-09-16 MED ORDER — SULFAMETHOXAZOLE-TRIMETHOPRIM 400-80 MG PO TABS: 1.0000 | ORAL_TABLET | Freq: Every day | ORAL | 0 refills | Status: DC

## 2018-09-16 MED ORDER — HEPARIN SODIUM (PORCINE) 1000 UNIT/ML DIALYSIS: 1000.0000 [IU] | INTRAMUSCULAR | Status: DC | PRN

## 2018-09-16 MED ORDER — DIPHENHYDRAMINE HCL 25 MG PO CAPS
ORAL_CAPSULE | ORAL | Status: AC
  Administered 2018-09-16: 25 mg
  Filled 2018-09-16: qty 1

## 2018-09-16 MED ORDER — ALTEPLASE 2 MG IJ SOLR: 2.0000 mg | Freq: Once | INTRAMUSCULAR | Status: DC | PRN

## 2018-09-16 MED ORDER — SULFAMETHOXAZOLE-TRIMETHOPRIM 400-80 MG PO TABS
1.0000 | ORAL_TABLET | Freq: Every day | ORAL | Status: DC
  Administered 2018-09-16: 1 via ORAL
  Filled 2018-09-16 (×2): qty 1

## 2018-09-16 MED ORDER — LIDOCAINE HCL (PF) 1 % IJ SOLN: 5.0000 mL | INTRAMUSCULAR | Status: DC | PRN

## 2018-09-16 NOTE — Progress Notes (Signed)
PT Cancellation Note  Patient Details Name: KALEI MEDA MRN: [id number] DOB: 13-Jul-1956   Cancelled Treatment:    Reason Eval/Treat Not Completed: Patient at procedure or test/unavailable;   Currently in HD;  Will follow,   Roney Marion, PT  Acute Rehabilitation Services Pager [phone] Office [place] 09/16/2018, 8:00 AM

## 2018-09-16 NOTE — Progress Notes (Signed)
Patient ID: Brenda Arnold, female   DOB: 1956/05/17, 62 y.o.   MRN: [id number]   Patient currently in dialysis.  I spoke to night as well as daytime nurse during report.  Patient appears to be doing well with minimal pain.  Went ahead and d/c iv fluids.  Nurse notes that there is no drainage to bandage. Also spoke to mom who was still in the room.  Patient will be going home with her once d/c from hospital.  No stairs to get into house or in house, but does not that house sits on an incline.  Will need patient to do well with PT prior to d/c. Anticipate d/c home with hhpt tomorrow.  Will continue to follow along with nephrology who has been consulted.

## 2018-09-16 NOTE — Progress Notes (Signed)
Physical Therapy Treatment Patient Details Name: Brenda Arnold MRN: [id number] DOB: 12/29/1955 Today's Date: 09/16/2018    History of Present Illness Pt is a 62 y/o female s/p elective R THA, direct anterior. PMH includes asthma, CKD, DM, HTN, a fib, sleep apnea, and s/p L AV fistula.     PT Comments    Continuing work on functional mobility and activity tolerance; Mobility limited this afternoon by decr BP, lending to feeling of passing out while sitting EOB; Serial BPs as follows:    09/16/18 1350 09/16/18 1354 09/16/18 1359  Vital Signs  Patient Position (if appropriate) Orthostatic Vitals  --   --   Orthostatic Lying   BP- Lying (!) 81/55 (after approx 30 sec sitting, symptomatic dizziness)  --  (!) 84/41  Pulse- Lying 92  --  87  Orthostatic Sitting  BP- Sitting (!) 77/42 (bed in chair position) (!) 87/55 (bed in chair position)  --   Pulse- Sitting 91 87  --    Will need to make considerable progress with functional mobility to be able to dc home; will monitor    Follow Up Recommendations  Follow surgeon's recommendation for DC plan and follow-up therapies;Supervision for mobility/OOB     Equipment Recommendations  Rolling walker with 5" wheels;3in1 (PT)    Recommendations for Other Services OT consult     Precautions / Restrictions Precautions Precautions: Fall Precaution Comments: Watch orthostatics Restrictions RLE Weight Bearing: Weight bearing as tolerated    Mobility  Bed Mobility Overal bed mobility: Needs Assistance Bed Mobility: Supine to Sit     Supine to sit: Mod assist     General bed mobility comments: Mod A for RLE assist and to use bed pad to turn hips towards EOB. Increased time to perform and pt very guarded throughout.   Transfers                 General transfer comment: Did not attempt standing; Very dizzy and less responsive sitting EOB; initiated BP sitting, however pt requested to lay down  Ambulation/Gait                  Stairs             Wheelchair Mobility    Modified Rankin (Stroke Patients Only)       Balance                                            Cognition Arousal/Alertness: Awake/alert Behavior During Therapy: WFL for tasks assessed/performed Overall Cognitive Status: Within Functional Limits for tasks assessed                                        Exercises Total Joint Exercises Ankle Circles/Pumps: AROM;Both;20 reps Quad Sets: AROM;Right;10 reps Gluteal Sets: AROM;Both;10 reps Towel Squeeze: AROM;Both;10 reps Heel Slides: AROM;Right;10 reps Hip ABduction/ADduction: AROM;Right;10 reps    General Comments        Pertinent Vitals/Pain Pain Assessment: 0-10 Pain Score: 2  Pain Location: R hip  Pain Descriptors / Indicators: Aching;Operative site guarding Pain Intervention(s): Monitored during session    Home Living                      Prior Function  PT Goals (current goals can now be found in the care plan section) Acute Rehab PT Goals Patient Stated Goal: for the pain to get better PT Goal Formulation: With patient Time For Goal Achievement: 09/29/18 Potential to Achieve Goals: Fair Progress towards PT goals: Not progressing toward goals - comment(limtied by low BP)    Frequency    7X/week      PT Plan Current plan remains appropriate;Other (comment)(Will need to make considerable progress with functional mobility to be able to dc home; will monitor)    Co-evaluation              AM-PAC PT "6 Clicks" Daily Activity  Outcome Measure  Difficulty turning over in bed (including adjusting bedclothes, sheets and blankets)?: A Lot Difficulty moving from lying on back to sitting on the side of the bed? : Unable Difficulty sitting down on and standing up from a chair with arms (e.g., wheelchair, bedside commode, etc,.)?: Unable Help needed moving to and from a bed to chair  (including a wheelchair)?: A Lot Help needed walking in hospital room?: Total Help needed climbing 3-5 steps with a railing? : Total 6 Click Score: 8    End of Session   Activity Tolerance: Other (comment)(limited by decr BP) Patient left: in bed;with call bell/phone within reach Nurse Communication: Mobility status;Other (comment)(decr activity tolerance) PT Visit Diagnosis: Unsteadiness on feet (R26.81);Muscle weakness (generalized) (M62.81);Pain Pain - Right/Left: Right Pain - part of body: Hip     Time: 1317-1400 PT Time Calculation (min) (ACUTE ONLY): 43 min  Charges:  $Therapeutic Exercise: 8-22 mins $Therapeutic Activity: 23-37 mins                     Roney Marion, PT  Acute Rehabilitation Services Pager [phone] Office [phone]    Brenda Arnold 09/16/2018, 4:40 PM

## 2018-09-16 NOTE — Progress Notes (Signed)
Pharmacy Antibiotic Note  Brenda Arnold is a 62 y.o. female admitted on 09/15/2018 with THR.  Pharmacy has been consulted for septra dosing for post-op prophylaxis. Pt is afebrile, baseline WBC is WNL. Pt with history of ESRD on HD.   Plan: Change Septra to 1 SS tab PO QHS Pharmacy will sign off.  Abx LOT per MD.   Weight: 224 lb 3.3 oz (101.7 kg)  Temp (24hrs), Avg:98.2 F (36.8 C), Min:97.7 F (36.5 C), Max:98.6 F (37 C)  Recent Labs  Lab 09/16/18 0255 09/16/18 0424  WBC  --  8.7  CREATININE 7.52*  --     Estimated Creatinine Clearance: 8.9 mL/min (A) (by C-G formula based on SCr of 7.52 mg/dL (H)).    Allergies  Allergen Reactions  . Iodine Shortness Of Breath  . Shellfish Allergy Shortness Of Breath  . Norvasc [Amlodipine] Swelling    SWELLING REACTION UNSPECIFIED      Ronelle Michie D. Mina Marble, PharmD, BCPS, [place] 09/16/2018, 1:27 PM

## 2018-09-16 NOTE — Progress Notes (Signed)
   Subjective:  Patient reports pain as mild.  No events. Had some hypotension with PT.  No symptoms.  Objective:   VITALS:   Vitals:   09/16/18 1108 09/16/18 1204 09/16/18 1227 09/16/18 1250  BP: (!) 97/43 (!) 83/39 (!) 86/37 90/76  Pulse: 83 74 81   Resp: 16  17 18   Temp: 97.7 F (36.5 C)  98.6 F (37 C) 98.6 F (37 C)  TempSrc: Oral  Oral Oral  SpO2: 98%  98% 98%  Weight: 101.7 kg       Neurologically intact Neurovascular intact Sensation intact distally Intact pulses distally Dorsiflexion/Plantar flexion intact Incision: dressing C/D/I and no drainage No cellulitis present Compartment soft   Lab Results  Component Value Date   WBC 8.7 09/16/2018   HGB 8.9 (L) 09/16/2018   HCT 28.0 (L) 09/16/2018   MCV 98.9 09/16/2018   PLT 125 (L) 09/16/2018     Assessment/Plan:  1 Day Post-Op   - Expected postop acute blood loss anemia - will monitor for symptoms - Up with PT/OT - DVT ppx - SCDs, ambulation, aspirin - WBAT operative extremity - Pain control - Discharge planning - likely dc home tomorrow with HHPT  Eduard Roux 09/16/2018, 5:31 PM [phone]

## 2018-09-16 NOTE — Progress Notes (Addendum)
Brenda Arnold Progress Note   Subjective:   Seen on HD.  Feeling well.  Denies pain.  No issues with HD. No complaints.   Objective Vitals:   09/16/18 1100 09/16/18 1108 09/16/18 1204 09/16/18 1227  BP: (!) 87/48 (!) 97/43 (!) 83/39 (!) 86/37  Pulse: 82 83 74 81  Resp:  16  17  Temp:  97.7 F (36.5 C)  98.6 F (37 C)  TempSrc:  Oral  Oral  SpO2:  98%  98%  Weight:  101.7 kg     Physical Exam General:NAD, chronically ill appearing obese female Heart:RRR, no mrg Lungs:CTAB, nml WOB Abdomen:soft, NTND Extremities:no edema Dialysis Access: LU AVF cannulated   Filed Weights   09/16/18 0654 09/16/18 1108  Weight: 103.6 kg 101.7 kg    Intake/Output Summary (Last 24 hours) at 09/16/2018 1246 Last data filed at 09/16/2018 1108 Gross per 24 hour  Intake 1801.36 ml  Output 1700 ml  Net 101.36 ml    Additional Objective Labs: Basic Metabolic Panel: Recent Labs  Lab 09/15/18 0621 09/16/18 0255  NA 138 136  K 4.2 5.0  CL  --  97*  CO2  --  28  GLUCOSE 79 98  BUN  --  43*  CREATININE  --  7.52*  CALCIUM  --  8.0*   CBC: Recent Labs  Lab 09/15/18 0621 09/16/18 0424  WBC  --  8.7  HGB 12.2 8.9*  HCT 36.0 28.0*  MCV  --  98.9  PLT  --  125*    CBG: Recent Labs  Lab 09/15/18 0554 09/15/18 0959  GLUCAP 76 85   Studies/Results: Dg Pelvis Portable  Result Date: 09/15/2018 CLINICAL DATA:  Post right hip replacement EXAM: PORTABLE PELVIS 1-2 VIEWS COMPARISON:  04/02/2018 FINDINGS: Changes of right hip replacement noted. Normal AP alignment. No visible hardware or bony complicating feature. Advanced osteoarthritic changes within the left hip. IMPRESSION: Right hip replacement.  No visible complicating feature. Electronically Signed   By: Rolm Baptise M.D.   On: 09/15/2018 10:15   Dg C-arm 1-60 Min  Result Date: 09/15/2018 CLINICAL DATA:  Hip surgery. EXAM: OPERATIVE RIGHT HIP (WITH PELVIS IF PERFORMED) 2 VIEWS TECHNIQUE: Fluoroscopic spot  image(s) were submitted for interpretation post-operatively. COMPARISON:  04/02/2018. FINDINGS: Total right hip replacement with anatomic alignment. Hardware intact. No acute bony abnormality. IMPRESSION: Total right hip replacement anatomic alignment. No acute bony abnormality. Electronically Signed   By: Marcello Moores  Register   On: 09/15/2018 09:36   Dg Hip Operative Unilat With Pelvis Right  Result Date: 09/15/2018 CLINICAL DATA:  Hip surgery. EXAM: OPERATIVE RIGHT HIP (WITH PELVIS IF PERFORMED) 2 VIEWS TECHNIQUE: Fluoroscopic spot image(s) were submitted for interpretation post-operatively. COMPARISON:  04/02/2018. FINDINGS: Total right hip replacement with anatomic alignment. Hardware intact. No acute bony abnormality. IMPRESSION: Total right hip replacement anatomic alignment. No acute bony abnormality. Electronically Signed   By: Marcello Moores  Register   On: 09/15/2018 09:36    Medications: . methocarbamol (ROBAXIN) IV    . sulfamethoxazole-trimethoprim 256 mg of trimethoprim (09/15/18 1844)   . aspirin  81 mg Oral BID  . Chlorhexidine Gluconate Cloth  6 each Topical Q0600  . dexamethasone  10 mg Intravenous Once  . docusate sodium  100 mg Oral BID  . gabapentin  100 mg Oral QHS  . ketorolac  15 mg Intravenous Q6H  . multivitamin  1 tablet Oral QHS  . oxyCODONE  10 mg Oral Q12H  . sucroferric oxyhydroxide  1,000 mg Oral  TID WC    Dialysis Orders: MWF - [facility]  4hrs, BFR 450, DFR 800,  EDW 101.5kg, 2K/ 2Ca Access: LU AVF  Heparin 8000 units & 3000 unit mid run Mircera none  Calcitriol 1 mcg PO qHD parsabiv 12.5mg  qHD  Assessment/Plan: 1.  R hip degenerative joint disease - s/p total hip replacement on R by Dr. Erlinda Hong.  Per ortho to d/c home with HHPT possibly tomorrow. 2.  ESRD -  On HD MWF. K 5.0. HD today per regular schedule. No Heparin due to surgery.  3.  Hypotension/volume  - BP soft. UF total removed today 1.7L.  Close to EDW. Does not appear volume overloaded on exam.  4.  Anemia of  CKD - Hgb 12.2>8.9 post surgery.  Will write orders for ESA with next dialysis either here or at her OP facility.  5.  Secondary Hyperparathyroidism -  Ca ok. Need to check phos. Continue VDRA and binders. 6.  Nutrition - Alb 3.7. Renal diet with fluid restrictions, renavite 7. A fib - per primary  Jen Mow, PA-C [facility] Pager: [phone] 09/16/2018,12:46 PM  LOS: 1 day   Pt seen, examined and agree w A/P as above.  Kelly Splinter MD [employer] pager [phone]   09/16/2018, 1:27 PM

## 2018-09-16 NOTE — Progress Notes (Signed)
OT Cancellation Note  Patient Details Name: Brenda Arnold MRN: [id number] DOB: [birth year]   Cancelled Treatment:    Reason Eval/Treat Not Completed: Patient at procedure or test/ unavailable(HD)  [name] 09/16/2018, 9:35 AM  [name] OTR/L Acute Rehabilitation Services Pager: [phone] Office: [phone]

## 2018-09-16 NOTE — Plan of Care (Signed)
  Problem: Education: Goal: Knowledge of General Education information will improve Description Including pain rating scale, medication(s)/side effects and non-pharmacologic comfort measures Outcome: Progressing Note:  POC and pain management reviewed with pt.   

## 2018-09-17 LAB — CBC
HCT: 29.8 % — ABNORMAL LOW (ref 36.0–46.0)
HCT: 30.4 % — ABNORMAL LOW (ref 36.0–46.0)
Hemoglobin: 9.5 g/dL — ABNORMAL LOW (ref 12.0–15.0)
Hemoglobin: 9.5 g/dL — ABNORMAL LOW (ref 12.0–15.0)
MCH: 31.1 pg (ref 26.0–34.0)
MCH: 30.8 pg (ref 26.0–34.0)
MCHC: 31.9 g/dL (ref 30.0–36.0)
MCHC: 31.3 g/dL (ref 30.0–36.0)
MCV: 97.7 fL (ref 80.0–100.0)
MCV: 98.7 fL (ref 80.0–100.0)
Platelets: 143 10*3/uL — ABNORMAL LOW (ref 150–400)
Platelets: 158 10*3/uL (ref 150–400)
RBC: 3.05 MIL/uL — ABNORMAL LOW (ref 3.87–5.11)
RBC: 3.08 MIL/uL — ABNORMAL LOW (ref 3.87–5.11)
RDW: 14.3 % (ref 11.5–15.5)
RDW: 14.4 % (ref 11.5–15.5)
WBC: 11.3 10*3/uL — ABNORMAL HIGH (ref 4.0–10.5)
WBC: 13 10*3/uL — ABNORMAL HIGH (ref 4.0–10.5)
nRBC: 0 % (ref 0.0–0.2)
nRBC: 0 % (ref 0.0–0.2)

## 2018-09-17 LAB — RENAL FUNCTION PANEL
Albumin: 3.1 g/dL — ABNORMAL LOW (ref 3.5–5.0)
Anion gap: 12 (ref 5–15)
BUN: 39 mg/dL — ABNORMAL HIGH (ref 8–23)
CO2: 27 mmol/L (ref 22–32)
Calcium: 8.5 mg/dL — ABNORMAL LOW (ref 8.9–10.3)
Chloride: 95 mmol/L — ABNORMAL LOW (ref 98–111)
Creatinine, Ser: 6.67 mg/dL — ABNORMAL HIGH (ref 0.44–1.00)
GFR calc Af Amer: 7 mL/min — ABNORMAL LOW (ref >60)
GFR calc non Af Amer: 6 mL/min — ABNORMAL LOW (ref >60)
Glucose, Bld: 117 mg/dL — ABNORMAL HIGH (ref 70–99)
Phosphorus: 4.5 mg/dL (ref 2.5–4.6)
Potassium: 4.1 mmol/L (ref 3.5–5.1)
Sodium: 134 mmol/L — ABNORMAL LOW (ref 135–145)

## 2018-09-17 LAB — HEPATITIS B SURFACE ANTIBODY, QUANTITATIVE: Hep B S AB Quant (Post): <3.1 m[IU]/mL — ABNORMAL LOW (ref >9.9)

## 2018-09-17 LAB — HEPATITIS B CORE ANTIBODY, TOTAL: Hep B Core Total Ab: NEGATIVE

## 2018-09-17 MED ORDER — HEPARIN SODIUM (PORCINE) 1000 UNIT/ML DIALYSIS
1000.0000 [IU] | INTRAMUSCULAR | Status: DC | PRN
  Filled 2018-09-17: qty 1

## 2018-09-17 MED ORDER — DARBEPOETIN ALFA 100 MCG/0.5ML IJ SOSY: 100.0000 ug | PREFILLED_SYRINGE | INTRAMUSCULAR | Status: DC

## 2018-09-17 MED ORDER — LIDOCAINE-PRILOCAINE 2.5-2.5 % EX CREA
1.0000 "application " | TOPICAL_CREAM | CUTANEOUS | Status: DC | PRN
  Filled 2018-09-17: qty 5

## 2018-09-17 MED ORDER — CHLORHEXIDINE GLUCONATE CLOTH 2 % EX PADS
6.0000 | MEDICATED_PAD | Freq: Every day | CUTANEOUS | Status: DC
  Administered 2018-09-17: 6 via TOPICAL

## 2018-09-17 MED ORDER — PENTAFLUOROPROP-TETRAFLUOROETH EX AERO: 1.0000 "application " | INHALATION_SPRAY | CUTANEOUS | Status: DC | PRN

## 2018-09-17 MED ORDER — ALTEPLASE 2 MG IJ SOLR
2.0000 mg | Freq: Once | INTRAMUSCULAR | Status: DC | PRN
  Filled 2018-09-17: qty 2

## 2018-09-17 MED ORDER — SODIUM CHLORIDE 0.9 % IV SOLN: 100.0000 mL | INTRAVENOUS | Status: DC | PRN

## 2018-09-17 MED ORDER — LIDOCAINE HCL (PF) 1 % IJ SOLN: 5.0000 mL | INTRAMUSCULAR | Status: DC | PRN

## 2018-09-17 MED ORDER — HEPARIN SODIUM (PORCINE) 1000 UNIT/ML DIALYSIS
100.0000 [IU]/kg | INTRAMUSCULAR | Status: DC | PRN
  Filled 2018-09-17: qty 11

## 2018-09-17 NOTE — Discharge Summary (Signed)
Patient ID: Brenda Arnold MRN: [id number] DOB/AGE: Oct 20, 1956 62 y.o.  Admit date: 09/15/2018 Discharge date: 09/17/2018  Admission Diagnoses:  Principal Problem:   Primary osteoarthritis of right hip Active Problems:   History of hip replacement   Discharge Diagnoses:  Same  Past Medical History:  Diagnosis Date  . Anemia   . Arthritis   . Asthma   . Complication of anesthesia    difficulty with getting oxygen saturation up  . Diabetes mellitus   . Dyslipidemia   . Epistaxis 11/05/2012  . ESRD (end stage renal disease) on dialysis [facility])    "MWF; [facility]" (09/15/2018)  . History of nuclear stress test    Myoview 5/18: EF 68, no infarct or ischemia, low risk  . HTN (hypertension)   . Hyperlipidemia   . Hyperparathyroidism due to renal insufficiency ([place])   . Obesity    s/p panniculectomy  . Paroxysmal A-fib ([facility])    a. chronic coumadin;  b. 12/2009 Echo: EF 60-65%, Gr 1 DD.  Marland Kitchen PCOS (polycystic ovarian syndrome)   . Pneumonia   . Seasonal allergies   . Sleep apnea    a. not using CPAP, last study  >8 yrs  . Vitamin D deficiency     Surgeries: Procedure(s): RIGHT TOTAL HIP ARTHROPLASTY ANTERIOR APPROACH on 09/15/2018   Consultants: Treatment Team:  Roney Jaffe, MD  Discharged Condition: Improved  Hospital Course: Brenda Arnold is an 62 y.o. female who was admitted 09/15/2018 for operative treatment ofPrimary osteoarthritis of right hip. Patient has severe unremitting pain that affects sleep, daily activities, and work/hobbies. After pre-op clearance the patient was taken to the operating room on 09/15/2018 and underwent  Procedure(s): RIGHT TOTAL HIP ARTHROPLASTY ANTERIOR APPROACH.    Patient was given perioperative antibiotics:  Anti-infectives (From admission, onward)   Start     Dose/Rate Route Frequency Ordered Stop   09/16/18 2200  sulfamethoxazole-trimethoprim (BACTRIM,SEPTRA) 400-80 MG per tablet 1 tablet     1 tablet Oral Daily at  bedtime 09/16/18 1331     09/16/18 0000  sulfamethoxazole-trimethoprim (BACTRIM) 400-80 MG tablet     1 tablet Oral Daily at bedtime 09/16/18 1734     09/15/18 1800  sulfamethoxazole-trimethoprim (BACTRIM) 256 mg of trimethoprim in dextrose 5 % 250 mL IVPB  Status:  Discontinued     2.5 mg/kg/day of trimethoprim  102.4 kg 266 mL/hr over 60 Minutes Intravenous Every 24 hours 09/15/18 1452 09/16/18 1331   09/15/18 1030  ceFAZolin (ANCEF) IVPB 2g/100 mL premix  Status:  Discontinued     2 g 200 mL/hr over 30 Minutes Intravenous Every 6 hours 09/15/18 1027 09/15/18 1355   09/15/18 0734  vancomycin (VANCOCIN) powder  Status:  Discontinued       As needed 09/15/18 0735 09/15/18 0955   09/15/18 0600  ceFAZolin (ANCEF) IVPB 2g/100 mL premix     2 g 200 mL/hr over 30 Minutes Intravenous On call to O.R. 09/15/18 0258 09/15/18 0742       Patient was given sequential compression devices, early ambulation, and chemoprophylaxis to prevent DVT.  Patient benefited maximally from hospital stay and there were no complications.    Recent vital signs:  Patient Vitals for the past 24 hrs:  BP Temp Temp src Pulse Resp SpO2 Weight  09/17/18 0508 (!) 111/56 98.1 F (36.7 C) Oral 78 16 99 % -  09/16/18 2126 (!) 98/52 97.8 F (36.6 C) Oral 95 16 97 % -  09/16/18 1250 90/76 98.6 F (37 C)  Oral - 18 98 % -  09/16/18 1227 (!) 86/37 98.6 F (37 C) Oral 81 17 98 % -  09/16/18 1204 (!) 83/39 - - 74 - - -  09/16/18 1108 (!) 97/43 97.7 F (36.5 C) Oral 83 16 98 % 101.7 kg  09/16/18 1100 (!) 87/48 - - 82 - - -  09/16/18 1030 (!) 84/38 - - 81 - - -  09/16/18 1000 (!) 86/36 - - 88 - - -  09/16/18 0930 (!) 79/35 - - 73 - - -  09/16/18 0900 (!) 93/43 - - 78 - - -  09/16/18 0830 (!) 86/39 - - 66 - - -  09/16/18 0800 (!) 85/44 - - 69 - - -     Recent laboratory studies:  Recent Labs    09/15/18 0621 09/16/18 0255 09/16/18 0424  WBC  --   --  8.7  HGB 12.2  --  8.9*  HCT 36.0  --  28.0*  PLT  --   --   125*  NA 138 136  --   K 4.2 5.0  --   CL  --  97*  --   CO2  --  28  --   BUN  --  43*  --   CREATININE  --  7.52*  --   GLUCOSE 79 98  --   CALCIUM  --  8.0*  --      Discharge Medications:   Allergies as of 09/17/2018      Reactions   Iodine Shortness Of Breath   Shellfish Allergy Shortness Of Breath   Norvasc [amlodipine] Swelling   SWELLING REACTION UNSPECIFIED       Medication List    STOP taking these medications   HYDROcodone-acetaminophen 5-325 MG tablet Commonly known as:  NORCO/VICODIN     TAKE these medications   aspirin EC 81 MG tablet Take 1 tablet (81 mg total) by mouth 2 (two) times daily.   DIALYVITE TABLET Tabs Take 1 tablet by mouth at bedtime.   ethyl chloride spray Apply 1 application topically See admin instructions. For dialysis   gabapentin 100 MG capsule Commonly known as:  NEURONTIN Take 100 mg by mouth at bedtime.   methocarbamol 500 MG tablet Commonly known as:  ROBAXIN Take 1 tablet (500 mg total) by mouth every 6 (six) hours as needed for muscle spasms.   ondansetron 4 MG tablet Commonly known as:  ZOFRAN Take 1-2 tablets (4-8 mg total) by mouth every 8 (eight) hours as needed for nausea or vomiting.   oxyCODONE 10 mg 12 hr tablet Commonly known as:  OXYCONTIN Take 1 tablet (10 mg total) by mouth every 12 (twelve) hours for 3 days.   oxyCODONE 5 MG immediate release tablet Commonly known as:  Oxy IR/ROXICODONE Take 1-3 tablets (5-15 mg total) by mouth every 4 (four) hours as needed.   promethazine 25 MG tablet Commonly known as:  PHENERGAN Take 1 tablet (25 mg total) by mouth every 6 (six) hours as needed for nausea.   senna-docusate 8.6-50 MG tablet Commonly known as:  Senokot-S Take 1 tablet by mouth at bedtime as needed.   sulfamethoxazole-trimethoprim 400-80 MG tablet Commonly known as:  BACTRIM,SEPTRA Take 1 tablet by mouth at bedtime.   VELPHORO 500 MG chewable tablet Generic drug:  sucroferric  oxyhydroxide Chew 2 capsules by mouth See admin instructions. 2 each three times daily with meals, and 2 for 1 daily snack  Durable Medical Equipment  (From admission, onward)         Start     Ordered   09/15/18 1028  DME Walker rolling  Once    Question:  Patient needs a walker to treat with the following condition  Answer:  History of hip replacement   09/15/18 1027   09/15/18 1028  DME 3 n 1  Once     09/15/18 1027   09/15/18 1028  DME Bedside commode  Once    Question:  Patient needs a bedside commode to treat with the following condition  Answer:  History of hip replacement   09/15/18 1027          Diagnostic Studies: Dg Pelvis Portable  Result Date: 09/15/2018 CLINICAL DATA:  Post right hip replacement EXAM: PORTABLE PELVIS 1-2 VIEWS COMPARISON:  04/02/2018 FINDINGS: Changes of right hip replacement noted. Normal AP alignment. No visible hardware or bony complicating feature. Advanced osteoarthritic changes within the left hip. IMPRESSION: Right hip replacement.  No visible complicating feature. Electronically Signed   By: Rolm Baptise M.D.   On: 09/15/2018 10:15   Dg C-arm 1-60 Min  Result Date: 09/15/2018 CLINICAL DATA:  Hip surgery. EXAM: OPERATIVE RIGHT HIP (WITH PELVIS IF PERFORMED) 2 VIEWS TECHNIQUE: Fluoroscopic spot image(s) were submitted for interpretation post-operatively. COMPARISON:  04/02/2018. FINDINGS: Total right hip replacement with anatomic alignment. Hardware intact. No acute bony abnormality. IMPRESSION: Total right hip replacement anatomic alignment. No acute bony abnormality. Electronically Signed   By: Marcello Moores  Register   On: 09/15/2018 09:36   Dg Hip Operative Unilat With Pelvis Right  Result Date: 09/15/2018 CLINICAL DATA:  Hip surgery. EXAM: OPERATIVE RIGHT HIP (WITH PELVIS IF PERFORMED) 2 VIEWS TECHNIQUE: Fluoroscopic spot image(s) were submitted for interpretation post-operatively. COMPARISON:  04/02/2018. FINDINGS: Total right hip  replacement with anatomic alignment. Hardware intact. No acute bony abnormality. IMPRESSION: Total right hip replacement anatomic alignment. No acute bony abnormality. Electronically Signed   By: [place]   On: 09/15/2018 09:36    Disposition: Discharge disposition: 01-Home or Self Care         Follow-up Information    Leandrew Koyanagi, MD In 2 weeks.   Specialty:  Orthopedic Surgery Why:  For suture removal, For wound re-check Contact information: [place] [address] [phone]            Signed: Aundra Dubin 09/17/2018, 7:43 AM

## 2018-09-17 NOTE — Progress Notes (Signed)
Subjective: 2 Days Post-Op Procedure(s) (LRB): RIGHT TOTAL HIP ARTHROPLASTY ANTERIOR APPROACH (Right) Patient reports pain as mild.  Patient notes orthostatic hypotension yesterday with PT.  Some lightheadedness as well when sitting up.   Objective: Vital signs in last 24 hours: Temp:  [97.7 F (36.5 C)-98.6 F (37 C)] 98.1 F (36.7 C) (10/17 0508) Pulse Rate:  [66-95] 78 (10/17 0508) Resp:  [16-18] 16 (10/17 0508) BP: (79-111)/(35-76) 111/56 (10/17 0508) SpO2:  [97 %-99 %] 99 % (10/17 0508) Weight:  [101.7 kg] 101.7 kg (10/16 1108)  Intake/Output from previous day: 10/16 0701 - 10/17 0700 In: 720 [P.O.:720] Out: 1700  Intake/Output this shift: No intake/output data recorded.  Recent Labs    09/15/18 0621 09/16/18 0424  HGB 12.2 8.9*   Recent Labs    09/15/18 0621 09/16/18 0424  WBC  --  8.7  RBC  --  2.83*  HCT 36.0 28.0*  PLT  --  125*   Recent Labs    09/15/18 0621 09/16/18 0255  NA 138 136  K 4.2 5.0  CL  --  97*  CO2  --  28  BUN  --  43*  CREATININE  --  7.52*  GLUCOSE 79 98  CALCIUM  --  8.0*   No results for input(s): LABPT, INR in the last 72 hours.  Neurologically intact Neurovascular intact Sensation intact distally Intact pulses distally Dorsiflexion/Plantar flexion intact Incision: dressing C/D/I No cellulitis present Compartment soft    Assessment/Plan: 2 Days Post-Op Procedure(s) (LRB): RIGHT TOTAL HIP ARTHROPLASTY ANTERIOR APPROACH (Right) Advance diet Up with therapy Discharge home with home health today as long as hemoglobin stable and she mobilizes well with PT WBAT RLE- no precautions ABLA-8.9 yesterday.  Will recheck today and possibly transfuse if <8.0    Aundra Dubin 09/17/2018, 7:41 AM

## 2018-09-17 NOTE — Evaluation (Signed)
Occupational Therapy Evaluation Patient Details Name: Brenda Arnold MRN: [id number] DOB: [birth year] Today's Date: 09/17/2018    History of Present Illness Pt is a 62 y/o female s/p elective R THA, direct anterior. PMH includes asthma, CKD, DM, HTN, a fib, sleep apnea, and s/p L AV fistula.    Clinical Impression   Pt admitted with above diagnoses. Met pt sitting up in chair with mother present for most of session. Focused session on functional ADL t/fs and LB ADLs. Pt completed tub transfer with min guard A and RW in rehab gym. Pt demonstrated exactly how she has to maneuver legs at home, safely executed. Introduced pt to LB ADL equipment, pt completing demonstrations of LB dressing (socks) with min A. She mentions having a sock aide at baseline, but one that is on the floor. She is planning to acquire a hip kit to promote LB ADL independence. Pt is min guard with all functional mobility, no symptoms of dizziness or lightheadedness during session. HHOT appropriate at d/c to address functional ADLs in the home. Will continue to follow pt acutely.    Follow Up Recommendations  Home health OT    Equipment Recommendations  Other (comment)(hip kit, handout provided)    Recommendations for Other Services       Precautions / Restrictions Precautions Precautions: Fall Precaution Comments: Watch orthostatics Restrictions Weight Bearing Restrictions: Yes RLE Weight Bearing: Weight bearing as tolerated      Mobility Bed Mobility Overal bed mobility: Needs Assistance             General bed mobility comments: up in chair on arrival  Transfers Overall transfer level: Needs assistance Equipment used: Rolling walker (2 wheeled) Transfers: Sit to/from Stand Sit to Stand: Min assist         General transfer comment: min A for power up, min guard for functional mobility    Balance Overall balance assessment: Needs assistance Sitting-balance support: No upper extremity  supported;Feet supported Sitting balance-Leahy Scale: Fair     Standing balance support: Bilateral upper extremity supported;During functional activity Standing balance-Leahy Scale: Poor Standing balance comment: Heavily reliant on UE support                            ADL either performed or assessed with clinical judgement   ADL Overall ADL's : Needs assistance/impaired Eating/Feeding: Independent;Sitting   Grooming: Supervision/safety;Standing   Upper Body Bathing: Set up;Sitting   Lower Body Bathing: Minimal assistance;Sitting/lateral leans;Cueing for compensatory techniques;With adaptive equipment   Upper Body Dressing : Independent;Sitting Upper Body Dressing Details (indicate cue type and reason): donned night gown while sitting up in chair Lower Body Dressing: Minimal assistance;Sitting/lateral leans;Cueing for compensatory techniques;With adaptive equipment Lower Body Dressing Details (indicate cue type and reason): to don socks with sock aide Toilet Transfer: Min guard;BSC;RW   Toileting- Clothing Manipulation and Hygiene: Modified independent;Sitting/lateral lean   Tub/ Shower Transfer: Min guard;Tub bench;Rolling walker Tub/Shower Transfer Details (indicate cue type and reason): to tub bench in rehab gym Functional mobility during ADLs: Min guard General ADL Comments: Met with pt, focusing session on needs with functional t/f and LB ADLs. Pt practiced tub transfer un therapy gym to tub bench with min guard A and RW. Introduced LB ADL equipment to pt to increase functional independence in LB ADLs. Pt practiced with sock aide at min A, reacher, and long handled sponge.     Vision Baseline Vision/History: Wears glasses Wears Glasses: At all times  Patient Visual Report: No change from baseline       Perception     Praxis      Pertinent Vitals/Pain Pain Assessment: Faces Faces Pain Scale: Hurts a little bit Pain Location: R hip with movement Pain  Descriptors / Indicators: Aching;Operative site guarding Pain Intervention(s): Monitored during session;Repositioned     Hand Dominance     Extremity/Trunk Assessment Upper Extremity Assessment Upper Extremity Assessment: Overall WFL for tasks assessed   Lower Extremity Assessment Lower Extremity Assessment: Defer to PT evaluation   Cervical / Trunk Assessment Cervical / Trunk Assessment: Normal   Communication Communication Communication: No difficulties   Cognition Arousal/Alertness: Awake/alert Behavior During Therapy: WFL for tasks assessed/performed Overall Cognitive Status: Within Functional Limits for tasks assessed                                     General Comments  Pt mother present during session    Exercises     Shoulder Instructions      Home Living Family/patient expects to be discharged to:: Private residence Living Arrangements: Alone Available Help at Discharge: Family;Available 24 hours/day Type of Home: House Home Access: Stairs to enter [employer] of Steps: 1 step up inside Entrance Stairs-Rails: None Home Layout: One level     Bathroom Shower/Tub: Teacher, early years/pre: Standard     Home Equipment: Environmental consultant - 2 wheels;Tub bench          Prior Functioning/Environment Level of Independence: Independent with assistive device(s)        Comments: Pt reports completing ADLs using RW for functional mobility, tub bench in the shower, sock aide for socks        OT Problem List: Decreased strength;Decreased knowledge of use of DME or AE;Decreased activity tolerance;Impaired balance (sitting and/or standing);Pain      OT Treatment/Interventions: Self-care/ADL training;Balance training;DME and/or AE instruction;Patient/family education    OT Goals(Current goals can be found in the care plan section) Acute Rehab OT Goals Patient Stated Goal: to get home and back to normal OT Goal Formulation: With  patient Time For Goal Achievement: 10/01/18 Potential to Achieve Goals: Good  OT Frequency: Min 2X/week   Barriers to D/C:            Co-evaluation              AM-PAC PT "6 Clicks" Daily Activity     Outcome Measure Help from another person eating meals?: None Help from another person taking care of personal grooming?: A Little Help from another person toileting, which includes using toliet, bedpan, or urinal?: A Little Help from another person bathing (including washing, rinsing, drying)?: A Little Help from another person to put on and taking off regular upper body clothing?: None Help from another person to put on and taking off regular lower body clothing?: A Little 6 Click Score: 20   End of Session Equipment Utilized During Treatment: Gait belt;Rolling walker Nurse Communication: Other (comment)(upon preparing for d/c)  Activity Tolerance: Patient tolerated treatment well Patient left: in chair;with call bell/phone within reach  OT Visit Diagnosis: Other abnormalities of gait and mobility (R26.89);Unsteadiness on feet (R26.81);Pain Pain - Right/Left: Right Pain - part of body: Hand;Leg                Time: 4132-4401 OT Time Calculation (min): 33 min Charges:  OT General Charges $OT Visit: 1 Visit OT Evaluation $  OT Eval Low Complexity: 1 Low OT Treatments $Self Care/Home Management : 8-22 mins   Zenovia Jarred, MSOT, OTR/L Behavioral Health OT/ Acute Relief OT  Zenovia Jarred 09/17/2018, 4:54 PM

## 2018-09-17 NOTE — Care Management Note (Signed)
Case Management Note  Patient Details  Name: Brenda Arnold MRN: [id number] Date of Birth: [birth year]  Subjective/Objective:      Right THA              Action/Plan: NCM spoke to pt and offered choice for [facility]. Pt agreeable to [facility] for [place], (preoperatively arranged by surgeon's office). Requested RW and 3n1 bedside commode. She has a Radiation protection practitioner at home. Contacted [facility] for DME to be delivered to room prior to dc .     Expected Discharge Date:  09/17/18               Expected Discharge Plan:  [place]  In-House Referral:  NA  Discharge planning Services  CM Consult  Post Acute Care Choice:  Home Health Choice offered to:  Patient  DME Arranged:  3-N-1, Walker rolling DME Agency:  [place]:  PT [place] Agency:  [facility]  Status of Service:  Completed, signed off  If discussed at [employer], dates discussed:    Additional Comments:  Erenest Rasher, RN 09/17/2018, 10:22 AM

## 2018-09-17 NOTE — Plan of Care (Signed)
  Problem: Pain Managment: Goal: General experience of comfort will improve Outcome: Progressing   

## 2018-09-17 NOTE — Progress Notes (Signed)
Physical Therapy Treatment Patient Details Name: Brenda Arnold MRN: [id number] DOB: Sep 17, 1956 Today's Date: 09/17/2018    History of Present Illness Pt is a 62 y/o female s/p elective R THA, direct anterior. PMH includes asthma, CKD, DM, HTN, a fib, sleep apnea, and s/p L AV fistula.     PT Comments    Pt is feeling better today. Pt is making good progress towards her goals, however continues to be limited in safe mobility by pain, and generalized weakness. Pt BP is in therapeutic levels throughout session (see below). Pt currently requires min guard for bed mobility and min A for transfers and ambulation of 80 feet with RW assist. D/c plans remain appropriate.  Orthostatic Blood Pressure: Blood pressure:   lying 104/46, sitting 123/62, standing 128/70 Pulse:   lying 86 bpm, sitting 82 bpm, standing 88 bpm     Follow Up Recommendations  Follow surgeon's recommendation for DC plan and follow-up therapies;Supervision for mobility/OOB     Equipment Recommendations  Rolling walker with 5" wheels;3in1 (PT)    Recommendations for Other Services OT consult     Precautions / Restrictions Precautions Precautions: Fall Precaution Comments: Watch orthostatics Restrictions Weight Bearing Restrictions: Yes RLE Weight Bearing: Weight bearing as tolerated    Mobility  Bed Mobility Overal bed mobility: Needs Assistance Bed Mobility: Supine to Sit     Supine to sit: Min guard     General bed mobility comments: min guard for safety   Transfers Overall transfer level: Needs assistance Equipment used: Rolling walker (2 wheeled) Transfers: Sit to/from Stand Sit to Stand: Min assist         General transfer comment: minA for power up and steadying in RW, Increased time and effort  Ambulation/Gait Ambulation/Gait assistance: Min assist Gait Distance (Feet): 80 Feet Assistive device: Rolling walker (2 wheeled) Gait Pattern/deviations: Step-through pattern;Decreased  step length - left;Decreased stance time - right;Decreased weight shift to right;Antalgic;Trunk flexed Gait velocity: Decreased  Gait velocity interpretation: <1.8 ft/sec, indicate of risk for recurrent falls General Gait Details: minA for steadying, vc for proximity to RW, and upright posture pt with increased L hip/gluteal pain as ambulation progressed         Balance Overall balance assessment: Needs assistance Sitting-balance support: No upper extremity supported;Feet supported Sitting balance-Leahy Scale: Fair     Standing balance support: Bilateral upper extremity supported;During functional activity Standing balance-Leahy Scale: Poor Standing balance comment: Heavily reliant on UE support                             Cognition Arousal/Alertness: Awake/alert Behavior During Therapy: WFL for tasks assessed/performed Overall Cognitive Status: Within Functional Limits for tasks assessed                                           General Comments General comments (skin integrity, edema, etc.): Pt's mother present during session.       Pertinent Vitals/Pain Pain Assessment: 0-10 Pain Score: 3  Pain Location: R hip, L hip after ambulation of 50 feet Pain Descriptors / Indicators: Aching;Operative site guarding Pain Intervention(s): Limited activity within patient's tolerance;Monitored during session;Repositioned           PT Goals (current goals can now be found in the care plan section) Acute Rehab PT Goals Patient Stated Goal: to get home PT Goal Formulation:  With patient Time For Goal Achievement: 09/29/18 Potential to Achieve Goals: Fair Progress towards PT goals: Progressing toward goals    Frequency    7X/week      PT Plan Current plan remains appropriate       AM-PAC PT "6 Clicks" Daily Activity  Outcome Measure  Difficulty turning over in bed (including adjusting bedclothes, sheets and blankets)?: A Lot Difficulty  moving from lying on back to sitting on the side of the bed? : Unable Difficulty sitting down on and standing up from a chair with arms (e.g., wheelchair, bedside commode, etc,.)?: Unable Help needed moving to and from a bed to chair (including a wheelchair)?: A Lot Help needed walking in hospital room?: Total Help needed climbing 3-5 steps with a railing? : Total 6 Click Score: 8    End of Session Equipment Utilized During Treatment: Gait belt Activity Tolerance: Patient tolerated treatment well Patient left: with call bell/phone within reach;in chair;with family/visitor present Nurse Communication: Mobility status PT Visit Diagnosis: Unsteadiness on feet (R26.81);Muscle weakness (generalized) (M62.81);Pain Pain - Right/Left: Right Pain - part of body: Hip     Time: 1205-1236 PT Time Calculation (min) (ACUTE ONLY): 31 min  Charges:  $Gait Training: 8-22 mins $Therapeutic Activity: 8-22 mins                     Jibril Mcminn B. Migdalia Dk PT, DPT Acute Rehabilitation Services Pager [phone] Office [phone]    [place] 09/17/2018, 1:09 PM

## 2018-09-17 NOTE — Progress Notes (Signed)
Discharge instructions completed with pt.  Pt verbalized understanding of the information.  Pt denies chest pain, shortness of breath, dizziness, lightheadedness, and n/v.  Pt's IV discontinued.  Pt discharged home.  

## 2018-09-17 NOTE — Progress Notes (Signed)
Subjective: Interval History: has complaints has not been up yet so does not know how it will go.  Objective: Vital signs in last 24 hours: Temp:  [97.8 F (36.6 C)-98.6 F (37 C)] 98.1 F (36.7 C) (10/17 0508) Pulse Rate:  [74-95] 78 (10/17 0508) Resp:  [16-18] 16 (10/17 0508) BP: (83-111)/(37-76) 111/56 (10/17 0508) SpO2:  [97 %-99 %] 99 % (10/17 0508) Weight change: -1.9 kg  Intake/Output from previous day: 10/16 0701 - 10/17 0700 In: 720 [P.O.:720] Out: 1700  Intake/Output this shift: Total I/O In: 240 [P.O.:240] Out: -   General appearance: alert, cooperative, no distress and moderately obese Resp: clear to auscultation bilaterally Cardio: S1, S2 normal and systolic murmur: systolic ejection 2/6, crescendo and decrescendo at 2nd left intercostal space GI: obese, pos bs, soft Extremities: AVF LLA, hip dressing  Lab Results: Recent Labs    09/16/18 0424 09/17/18 0758  WBC 8.7 11.3*  HGB 8.9* 9.5*  HCT 28.0* 29.8*  PLT 125* 143*   BMET:  Recent Labs    09/15/18 0621 09/16/18 0255  NA 138 136  K 4.2 5.0  CL  --  97*  CO2  --  28  GLUCOSE 79 98  BUN  --  43*  CREATININE  --  7.52*  CALCIUM  --  8.0*   No results for input(s): PTH in the last 72 hours. Iron Studies: No results for input(s): IRON, TIBC, TRANSFERRIN, FERRITIN in the last 72 hours.  Studies/Results: No results found.  I have reviewed the patient's current medications.  Assessment/Plan: 1 ESRD for HD tomorrow.  2 Hip op per ortho 3 Anemia op , need esa 4 HPTH use vit D 5 Obesity 6 DM controlled P HD, mobilize, esa, control DM    LOS: 2 days   Jeneen Rinks Kemper Heupel 09/17/2018,11:11 AM

## 2018-09-18 DIAGNOSIS — N2581 Secondary hyperparathyroidism of renal origin: Secondary | ICD-10-CM | POA: Diagnosis not present

## 2018-09-18 DIAGNOSIS — N186 End stage renal disease: Secondary | ICD-10-CM | POA: Diagnosis not present

## 2018-09-18 LAB — HEPATITIS B SURFACE ANTIGEN: Hepatitis B Surface Ag: NEGATIVE

## 2018-09-19 DIAGNOSIS — I120 Hypertensive chronic kidney disease with stage 5 chronic kidney disease or end stage renal disease: Secondary | ICD-10-CM | POA: Diagnosis not present

## 2018-09-19 DIAGNOSIS — E669 Obesity, unspecified: Secondary | ICD-10-CM | POA: Diagnosis not present

## 2018-09-19 DIAGNOSIS — D631 Anemia in chronic kidney disease: Secondary | ICD-10-CM | POA: Diagnosis not present

## 2018-09-19 DIAGNOSIS — Z79891 Long term (current) use of opiate analgesic: Secondary | ICD-10-CM | POA: Diagnosis not present

## 2018-09-19 DIAGNOSIS — Z992 Dependence on renal dialysis: Secondary | ICD-10-CM | POA: Diagnosis not present

## 2018-09-19 DIAGNOSIS — Z6837 Body mass index (BMI) 37.0-37.9, adult: Secondary | ICD-10-CM | POA: Diagnosis not present

## 2018-09-19 DIAGNOSIS — Z8701 Personal history of pneumonia (recurrent): Secondary | ICD-10-CM | POA: Diagnosis not present

## 2018-09-19 DIAGNOSIS — Z471 Aftercare following joint replacement surgery: Secondary | ICD-10-CM | POA: Diagnosis not present

## 2018-09-19 DIAGNOSIS — N2581 Secondary hyperparathyroidism of renal origin: Secondary | ICD-10-CM | POA: Diagnosis not present

## 2018-09-19 DIAGNOSIS — J45909 Unspecified asthma, uncomplicated: Secondary | ICD-10-CM | POA: Diagnosis not present

## 2018-09-19 DIAGNOSIS — E559 Vitamin D deficiency, unspecified: Secondary | ICD-10-CM | POA: Diagnosis not present

## 2018-09-19 DIAGNOSIS — I480 Paroxysmal atrial fibrillation: Secondary | ICD-10-CM | POA: Diagnosis not present

## 2018-09-19 DIAGNOSIS — E282 Polycystic ovarian syndrome: Secondary | ICD-10-CM | POA: Diagnosis not present

## 2018-09-19 DIAGNOSIS — E1122 Type 2 diabetes mellitus with diabetic chronic kidney disease: Secondary | ICD-10-CM | POA: Diagnosis not present

## 2018-09-19 DIAGNOSIS — Z7982 Long term (current) use of aspirin: Secondary | ICD-10-CM | POA: Diagnosis not present

## 2018-09-19 DIAGNOSIS — N186 End stage renal disease: Secondary | ICD-10-CM | POA: Diagnosis not present

## 2018-09-21 ENCOUNTER — Telehealth (INDEPENDENT_AMBULATORY_CARE_PROVIDER_SITE_OTHER): Payer: Self-pay

## 2018-09-21 DIAGNOSIS — N2581 Secondary hyperparathyroidism of renal origin: Secondary | ICD-10-CM | POA: Diagnosis not present

## 2018-09-21 DIAGNOSIS — N186 End stage renal disease: Secondary | ICD-10-CM | POA: Diagnosis not present

## 2018-09-21 NOTE — Telephone Encounter (Signed)
Mark with [facility] called requesting HHPT orders for HHPT 1wk/1 3wk/1 1wk/1 Verbal given.

## 2018-09-22 DIAGNOSIS — I480 Paroxysmal atrial fibrillation: Secondary | ICD-10-CM | POA: Diagnosis not present

## 2018-09-22 DIAGNOSIS — E669 Obesity, unspecified: Secondary | ICD-10-CM | POA: Diagnosis not present

## 2018-09-22 DIAGNOSIS — N186 End stage renal disease: Secondary | ICD-10-CM | POA: Diagnosis not present

## 2018-09-22 DIAGNOSIS — E282 Polycystic ovarian syndrome: Secondary | ICD-10-CM | POA: Diagnosis not present

## 2018-09-22 DIAGNOSIS — Z7982 Long term (current) use of aspirin: Secondary | ICD-10-CM | POA: Diagnosis not present

## 2018-09-22 DIAGNOSIS — Z471 Aftercare following joint replacement surgery: Secondary | ICD-10-CM | POA: Diagnosis not present

## 2018-09-22 DIAGNOSIS — Z992 Dependence on renal dialysis: Secondary | ICD-10-CM | POA: Diagnosis not present

## 2018-09-22 DIAGNOSIS — N2581 Secondary hyperparathyroidism of renal origin: Secondary | ICD-10-CM | POA: Diagnosis not present

## 2018-09-22 DIAGNOSIS — E1122 Type 2 diabetes mellitus with diabetic chronic kidney disease: Secondary | ICD-10-CM | POA: Diagnosis not present

## 2018-09-22 DIAGNOSIS — J45909 Unspecified asthma, uncomplicated: Secondary | ICD-10-CM | POA: Diagnosis not present

## 2018-09-22 DIAGNOSIS — Z79891 Long term (current) use of opiate analgesic: Secondary | ICD-10-CM | POA: Diagnosis not present

## 2018-09-22 DIAGNOSIS — D631 Anemia in chronic kidney disease: Secondary | ICD-10-CM | POA: Diagnosis not present

## 2018-09-22 DIAGNOSIS — E559 Vitamin D deficiency, unspecified: Secondary | ICD-10-CM | POA: Diagnosis not present

## 2018-09-22 DIAGNOSIS — Z6837 Body mass index (BMI) 37.0-37.9, adult: Secondary | ICD-10-CM | POA: Diagnosis not present

## 2018-09-22 DIAGNOSIS — Z8701 Personal history of pneumonia (recurrent): Secondary | ICD-10-CM | POA: Diagnosis not present

## 2018-09-22 DIAGNOSIS — I120 Hypertensive chronic kidney disease with stage 5 chronic kidney disease or end stage renal disease: Secondary | ICD-10-CM | POA: Diagnosis not present

## 2018-09-23 DIAGNOSIS — E559 Vitamin D deficiency, unspecified: Secondary | ICD-10-CM | POA: Diagnosis not present

## 2018-09-23 DIAGNOSIS — Z992 Dependence on renal dialysis: Secondary | ICD-10-CM | POA: Diagnosis not present

## 2018-09-23 DIAGNOSIS — I480 Paroxysmal atrial fibrillation: Secondary | ICD-10-CM | POA: Diagnosis not present

## 2018-09-23 DIAGNOSIS — Z6837 Body mass index (BMI) 37.0-37.9, adult: Secondary | ICD-10-CM | POA: Diagnosis not present

## 2018-09-23 DIAGNOSIS — N2581 Secondary hyperparathyroidism of renal origin: Secondary | ICD-10-CM | POA: Diagnosis not present

## 2018-09-23 DIAGNOSIS — N186 End stage renal disease: Secondary | ICD-10-CM | POA: Diagnosis not present

## 2018-09-23 DIAGNOSIS — J45909 Unspecified asthma, uncomplicated: Secondary | ICD-10-CM | POA: Diagnosis not present

## 2018-09-23 DIAGNOSIS — Z471 Aftercare following joint replacement surgery: Secondary | ICD-10-CM | POA: Diagnosis not present

## 2018-09-23 DIAGNOSIS — Z79891 Long term (current) use of opiate analgesic: Secondary | ICD-10-CM | POA: Diagnosis not present

## 2018-09-23 DIAGNOSIS — E282 Polycystic ovarian syndrome: Secondary | ICD-10-CM | POA: Diagnosis not present

## 2018-09-23 DIAGNOSIS — Z7982 Long term (current) use of aspirin: Secondary | ICD-10-CM | POA: Diagnosis not present

## 2018-09-23 DIAGNOSIS — E669 Obesity, unspecified: Secondary | ICD-10-CM | POA: Diagnosis not present

## 2018-09-23 DIAGNOSIS — I120 Hypertensive chronic kidney disease with stage 5 chronic kidney disease or end stage renal disease: Secondary | ICD-10-CM | POA: Diagnosis not present

## 2018-09-23 DIAGNOSIS — Z8701 Personal history of pneumonia (recurrent): Secondary | ICD-10-CM | POA: Diagnosis not present

## 2018-09-23 DIAGNOSIS — E1122 Type 2 diabetes mellitus with diabetic chronic kidney disease: Secondary | ICD-10-CM | POA: Diagnosis not present

## 2018-09-23 DIAGNOSIS — D631 Anemia in chronic kidney disease: Secondary | ICD-10-CM | POA: Diagnosis not present

## 2018-09-24 DIAGNOSIS — I120 Hypertensive chronic kidney disease with stage 5 chronic kidney disease or end stage renal disease: Secondary | ICD-10-CM | POA: Diagnosis not present

## 2018-09-24 DIAGNOSIS — N186 End stage renal disease: Secondary | ICD-10-CM | POA: Diagnosis not present

## 2018-09-24 DIAGNOSIS — N2581 Secondary hyperparathyroidism of renal origin: Secondary | ICD-10-CM | POA: Diagnosis not present

## 2018-09-24 DIAGNOSIS — D631 Anemia in chronic kidney disease: Secondary | ICD-10-CM | POA: Diagnosis not present

## 2018-09-24 DIAGNOSIS — I480 Paroxysmal atrial fibrillation: Secondary | ICD-10-CM | POA: Diagnosis not present

## 2018-09-24 DIAGNOSIS — E1122 Type 2 diabetes mellitus with diabetic chronic kidney disease: Secondary | ICD-10-CM | POA: Diagnosis not present

## 2018-09-24 DIAGNOSIS — E559 Vitamin D deficiency, unspecified: Secondary | ICD-10-CM | POA: Diagnosis not present

## 2018-09-24 DIAGNOSIS — E669 Obesity, unspecified: Secondary | ICD-10-CM | POA: Diagnosis not present

## 2018-09-24 DIAGNOSIS — E282 Polycystic ovarian syndrome: Secondary | ICD-10-CM | POA: Diagnosis not present

## 2018-09-24 DIAGNOSIS — Z992 Dependence on renal dialysis: Secondary | ICD-10-CM | POA: Diagnosis not present

## 2018-09-24 DIAGNOSIS — Z79891 Long term (current) use of opiate analgesic: Secondary | ICD-10-CM | POA: Diagnosis not present

## 2018-09-24 DIAGNOSIS — J45909 Unspecified asthma, uncomplicated: Secondary | ICD-10-CM | POA: Diagnosis not present

## 2018-09-24 DIAGNOSIS — Z471 Aftercare following joint replacement surgery: Secondary | ICD-10-CM | POA: Diagnosis not present

## 2018-09-24 DIAGNOSIS — Z6837 Body mass index (BMI) 37.0-37.9, adult: Secondary | ICD-10-CM | POA: Diagnosis not present

## 2018-09-24 DIAGNOSIS — Z8701 Personal history of pneumonia (recurrent): Secondary | ICD-10-CM | POA: Diagnosis not present

## 2018-09-24 DIAGNOSIS — Z7982 Long term (current) use of aspirin: Secondary | ICD-10-CM | POA: Diagnosis not present

## 2018-09-25 DIAGNOSIS — N186 End stage renal disease: Secondary | ICD-10-CM | POA: Diagnosis not present

## 2018-09-25 DIAGNOSIS — N2581 Secondary hyperparathyroidism of renal origin: Secondary | ICD-10-CM | POA: Diagnosis not present

## 2018-09-28 DIAGNOSIS — N186 End stage renal disease: Secondary | ICD-10-CM | POA: Diagnosis not present

## 2018-09-28 DIAGNOSIS — N2581 Secondary hyperparathyroidism of renal origin: Secondary | ICD-10-CM | POA: Diagnosis not present

## 2018-09-29 ENCOUNTER — Inpatient Hospital Stay (INDEPENDENT_AMBULATORY_CARE_PROVIDER_SITE_OTHER): Payer: BLUE CROSS/BLUE SHIELD | Admitting: Physician Assistant

## 2018-09-30 DIAGNOSIS — N186 End stage renal disease: Secondary | ICD-10-CM | POA: Diagnosis not present

## 2018-09-30 DIAGNOSIS — N2581 Secondary hyperparathyroidism of renal origin: Secondary | ICD-10-CM | POA: Diagnosis not present

## 2018-10-01 ENCOUNTER — Encounter (INDEPENDENT_AMBULATORY_CARE_PROVIDER_SITE_OTHER): Payer: Self-pay | Admitting: Physician Assistant

## 2018-10-01 ENCOUNTER — Ambulatory Visit (INDEPENDENT_AMBULATORY_CARE_PROVIDER_SITE_OTHER): Payer: BLUE CROSS/BLUE SHIELD | Admitting: Physician Assistant

## 2018-10-01 DIAGNOSIS — Z96641 Presence of right artificial hip joint: Secondary | ICD-10-CM

## 2018-10-01 DIAGNOSIS — E559 Vitamin D deficiency, unspecified: Secondary | ICD-10-CM | POA: Diagnosis not present

## 2018-10-01 DIAGNOSIS — Z8701 Personal history of pneumonia (recurrent): Secondary | ICD-10-CM | POA: Diagnosis not present

## 2018-10-01 DIAGNOSIS — Z471 Aftercare following joint replacement surgery: Secondary | ICD-10-CM | POA: Diagnosis not present

## 2018-10-01 DIAGNOSIS — Z7982 Long term (current) use of aspirin: Secondary | ICD-10-CM | POA: Diagnosis not present

## 2018-10-01 DIAGNOSIS — E282 Polycystic ovarian syndrome: Secondary | ICD-10-CM | POA: Diagnosis not present

## 2018-10-01 DIAGNOSIS — N2581 Secondary hyperparathyroidism of renal origin: Secondary | ICD-10-CM | POA: Diagnosis not present

## 2018-10-01 DIAGNOSIS — E669 Obesity, unspecified: Secondary | ICD-10-CM | POA: Diagnosis not present

## 2018-10-01 DIAGNOSIS — Z6837 Body mass index (BMI) 37.0-37.9, adult: Secondary | ICD-10-CM | POA: Diagnosis not present

## 2018-10-01 DIAGNOSIS — I120 Hypertensive chronic kidney disease with stage 5 chronic kidney disease or end stage renal disease: Secondary | ICD-10-CM | POA: Diagnosis not present

## 2018-10-01 DIAGNOSIS — Z79891 Long term (current) use of opiate analgesic: Secondary | ICD-10-CM | POA: Diagnosis not present

## 2018-10-01 DIAGNOSIS — E1122 Type 2 diabetes mellitus with diabetic chronic kidney disease: Secondary | ICD-10-CM | POA: Diagnosis not present

## 2018-10-01 DIAGNOSIS — I480 Paroxysmal atrial fibrillation: Secondary | ICD-10-CM | POA: Diagnosis not present

## 2018-10-01 DIAGNOSIS — J45909 Unspecified asthma, uncomplicated: Secondary | ICD-10-CM | POA: Diagnosis not present

## 2018-10-01 DIAGNOSIS — N186 End stage renal disease: Secondary | ICD-10-CM | POA: Diagnosis not present

## 2018-10-01 DIAGNOSIS — D631 Anemia in chronic kidney disease: Secondary | ICD-10-CM | POA: Diagnosis not present

## 2018-10-01 DIAGNOSIS — Z992 Dependence on renal dialysis: Secondary | ICD-10-CM | POA: Diagnosis not present

## 2018-10-01 NOTE — Progress Notes (Signed)
Post-Op Visit Note   Patient: Brenda Arnold           Date of Birth: 1956/09/19           MRN: [id number] Visit Date: 10/01/2018 PCP: Kelton Pillar, MD   Assessment & Plan:  Chief Complaint:  Chief Complaint  Patient presents with  . Right Hip - Pain   Visit Diagnoses:  1. Status post total replacement of right hip     Plan: Patient is a pleasant 62 year old female who presents to our clinic today 16 days status post right anterior total hip replacement, date of surgery 09/15/2018.  She has been doing excellent since surgery.  She has only taken 3 narcotic pain pills.  No fevers or chills.  Examination of her right hip wound reveals a well-healing surgical incision without evidence of infection.  Nylon sutures in place.  Calf is soft and nontender.  Today, we will remove the nylon sutures and apply Steri-Strips.  We will extend her home health physical therapy.  She will follow-up with [place] in 4 weeks time for repeat evaluation and two-view x-rays.  Follow-Up Instructions: Return in about 4 weeks (around 10/29/2018).   Orders:  No orders of the defined types were placed in this encounter.  No orders of the defined types were placed in this encounter.   Imaging: No new imaging  PMFS History: Patient Active Problem List   Diagnosis Date Noted  . Primary osteoarthritis of right hip 09/15/2018  . Status post total replacement of right hip 09/15/2018  . Varicose veins of bilateral lower extremities with other complications 80/16/5537  . Anticoagulation goal of INR 2 to 3 09/30/2014  . Pain in lower limb 05/02/2014  . Onychomycosis due to dermatophyte 04/06/2013  . Pain in joint, ankle and foot 04/06/2013  . Bradycardia 11/06/2012  . Left-sided epistaxis 11/05/2012  . DM (diabetes mellitus) ([place]) 11/05/2012  . OSA (obstructive sleep apnea) 11/05/2012  . Acute posterior epistaxis 11/05/2012  . CKD (chronic kidney disease) 11/05/2012  . End stage renal disease ([place])  06/25/2012  . Type 2 diabetes mellitus with complication, without long-term current use of insulin ([place]) 12/20/2009  . OBESITY 12/20/2009  . OBESITY-MORBID (>100') 12/20/2009  . Essential hypertension 12/20/2009  . Paroxysmal atrial fibrillation ([place]) 12/20/2009  . CHEST PAIN-UNSPECIFIED 12/20/2009  . ABNORMAL CV (STRESS) TEST 12/20/2009   Past Medical History:  Diagnosis Date  . Anemia   . Arthritis   . Asthma   . Complication of anesthesia    difficulty with getting oxygen saturation up  . Diabetes mellitus   . Dyslipidemia   . Epistaxis 11/05/2012  . ESRD (end stage renal disease) on dialysis [facility])    "MWF; Jeneen Rinks" (09/15/2018)  . History of nuclear stress test    Myoview 5/18: EF 68, no infarct or ischemia, low risk  . HTN (hypertension)   . Hyperlipidemia   . Hyperparathyroidism due to renal insufficiency ([place])   . Obesity    s/p panniculectomy  . Paroxysmal A-fib ([facility])    a. chronic coumadin;  b. 12/2009 Echo: EF 60-65%, Gr 1 DD.  Marland Kitchen PCOS (polycystic ovarian syndrome)   . Pneumonia   . Seasonal allergies   . Sleep apnea    a. not using CPAP, last study  >8 yrs  . Vitamin D deficiency     Family History  Problem Relation Age of Onset  . Lung cancer Father        died @ 3  .  Hypertension Mother        alive @ 36  . Diabetes Brother   . Kidney disease Brother     Past Surgical History:  Procedure Laterality Date  . ABDOMINAL HYSTERECTOMY     with panniculctomy  . AV FISTULA PLACEMENT  09/02/2012   Procedure: ARTERIOVENOUS (AV) FISTULA CREATION;  Surgeon: Elam Dutch, MD;  Location: [facility] OR;  Service: Vascular;  Laterality: Left;  Creation of Left Radial-Cephalic Fistula   . BREAST SURGERY     Biopsy right breast  . COLONOSCOPY W/ BIOPSIES AND POLYPECTOMY    . DILATION AND CURETTAGE OF UTERUS    . KNEE ARTHROSCOPY Right   . REVISON OF ARTERIOVENOUS FISTULA Left 04/27/2014   Procedure: REVISON OF LEFT RADIAL-CEPHALIC ARTERIOVENOUS FISTULA;  Surgeon:  Mal Misty, MD;  Location: [place];  Service: Vascular;  Laterality: Left;  . TOTAL HIP ARTHROPLASTY Right 09/15/2018  . TOTAL HIP ARTHROPLASTY Right 09/15/2018   Procedure: RIGHT TOTAL HIP ARTHROPLASTY ANTERIOR APPROACH;  Surgeon: Leandrew Koyanagi, MD;  Location: [place];  Service: Orthopedics;  Laterality: Right;  . UVULOPLASTY     Social History   Occupational History  . Not on file  Tobacco Use  . Smoking status: Never Smoker  . Smokeless tobacco: Never Used  Substance and Sexual Activity  . Alcohol use: No    Alcohol/week: 0.0 standard drinks  . Drug use: No  . Sexual activity: Not on file

## 2018-10-02 DIAGNOSIS — N186 End stage renal disease: Secondary | ICD-10-CM | POA: Diagnosis not present

## 2018-10-02 DIAGNOSIS — N2581 Secondary hyperparathyroidism of renal origin: Secondary | ICD-10-CM | POA: Diagnosis not present

## 2018-10-02 DIAGNOSIS — E875 Hyperkalemia: Secondary | ICD-10-CM | POA: Insufficient documentation

## 2018-10-05 DIAGNOSIS — N2581 Secondary hyperparathyroidism of renal origin: Secondary | ICD-10-CM | POA: Diagnosis not present

## 2018-10-05 DIAGNOSIS — N186 End stage renal disease: Secondary | ICD-10-CM | POA: Diagnosis not present

## 2018-10-07 DIAGNOSIS — N186 End stage renal disease: Secondary | ICD-10-CM | POA: Diagnosis not present

## 2018-10-07 DIAGNOSIS — N2581 Secondary hyperparathyroidism of renal origin: Secondary | ICD-10-CM | POA: Diagnosis not present

## 2018-10-09 DIAGNOSIS — N186 End stage renal disease: Secondary | ICD-10-CM | POA: Diagnosis not present

## 2018-10-09 DIAGNOSIS — N2581 Secondary hyperparathyroidism of renal origin: Secondary | ICD-10-CM | POA: Diagnosis not present

## 2018-10-12 DIAGNOSIS — N186 End stage renal disease: Secondary | ICD-10-CM | POA: Diagnosis not present

## 2018-10-12 DIAGNOSIS — N2581 Secondary hyperparathyroidism of renal origin: Secondary | ICD-10-CM | POA: Diagnosis not present

## 2018-10-14 DIAGNOSIS — N186 End stage renal disease: Secondary | ICD-10-CM | POA: Diagnosis not present

## 2018-10-14 DIAGNOSIS — N2581 Secondary hyperparathyroidism of renal origin: Secondary | ICD-10-CM | POA: Diagnosis not present

## 2018-10-16 ENCOUNTER — Telehealth (INDEPENDENT_AMBULATORY_CARE_PROVIDER_SITE_OTHER): Payer: Self-pay | Admitting: Orthopaedic Surgery

## 2018-10-16 DIAGNOSIS — N2581 Secondary hyperparathyroidism of renal origin: Secondary | ICD-10-CM | POA: Diagnosis not present

## 2018-10-16 DIAGNOSIS — N186 End stage renal disease: Secondary | ICD-10-CM | POA: Diagnosis not present

## 2018-10-16 NOTE — Telephone Encounter (Signed)
Patient left a message wanting to know if Dr. Erlinda Hong would consider her for hip replacement surgery for her left hip in the 1st or 2nd week of December.  CB#[phone].  Thank you.

## 2018-10-16 NOTE — Telephone Encounter (Signed)
Please advise 

## 2018-10-16 NOTE — Telephone Encounter (Signed)
We'll have to see how she's doing at her next appt first

## 2018-10-19 DIAGNOSIS — N186 End stage renal disease: Secondary | ICD-10-CM | POA: Diagnosis not present

## 2018-10-19 DIAGNOSIS — N2581 Secondary hyperparathyroidism of renal origin: Secondary | ICD-10-CM | POA: Diagnosis not present

## 2018-10-19 NOTE — Telephone Encounter (Signed)
I left voicemail for patient advising. Her next scheduled follow up is on 11/03/18.

## 2018-10-21 DIAGNOSIS — N2581 Secondary hyperparathyroidism of renal origin: Secondary | ICD-10-CM | POA: Diagnosis not present

## 2018-10-21 DIAGNOSIS — N186 End stage renal disease: Secondary | ICD-10-CM | POA: Diagnosis not present

## 2018-10-23 DIAGNOSIS — N186 End stage renal disease: Secondary | ICD-10-CM | POA: Diagnosis not present

## 2018-10-23 DIAGNOSIS — N2581 Secondary hyperparathyroidism of renal origin: Secondary | ICD-10-CM | POA: Diagnosis not present

## 2018-10-25 DIAGNOSIS — N2581 Secondary hyperparathyroidism of renal origin: Secondary | ICD-10-CM | POA: Diagnosis not present

## 2018-10-25 DIAGNOSIS — N186 End stage renal disease: Secondary | ICD-10-CM | POA: Diagnosis not present

## 2018-10-27 DIAGNOSIS — N186 End stage renal disease: Secondary | ICD-10-CM | POA: Diagnosis not present

## 2018-10-27 DIAGNOSIS — N2581 Secondary hyperparathyroidism of renal origin: Secondary | ICD-10-CM | POA: Diagnosis not present

## 2018-10-30 DIAGNOSIS — N2581 Secondary hyperparathyroidism of renal origin: Secondary | ICD-10-CM | POA: Diagnosis not present

## 2018-10-30 DIAGNOSIS — N186 End stage renal disease: Secondary | ICD-10-CM | POA: Diagnosis not present

## 2018-10-31 DIAGNOSIS — Z992 Dependence on renal dialysis: Secondary | ICD-10-CM | POA: Diagnosis not present

## 2018-10-31 DIAGNOSIS — N186 End stage renal disease: Secondary | ICD-10-CM | POA: Diagnosis not present

## 2018-10-31 DIAGNOSIS — E1122 Type 2 diabetes mellitus with diabetic chronic kidney disease: Secondary | ICD-10-CM | POA: Diagnosis not present

## 2018-11-02 DIAGNOSIS — N2581 Secondary hyperparathyroidism of renal origin: Secondary | ICD-10-CM | POA: Diagnosis not present

## 2018-11-02 DIAGNOSIS — N186 End stage renal disease: Secondary | ICD-10-CM | POA: Diagnosis not present

## 2018-11-03 ENCOUNTER — Ambulatory Visit (INDEPENDENT_AMBULATORY_CARE_PROVIDER_SITE_OTHER): Payer: Self-pay

## 2018-11-03 ENCOUNTER — Other Ambulatory Visit: Payer: Self-pay

## 2018-11-03 ENCOUNTER — Ambulatory Visit (INDEPENDENT_AMBULATORY_CARE_PROVIDER_SITE_OTHER): Payer: BLUE CROSS/BLUE SHIELD | Admitting: Orthopaedic Surgery

## 2018-11-03 ENCOUNTER — Emergency Department (HOSPITAL_COMMUNITY): Payer: BLUE CROSS/BLUE SHIELD

## 2018-11-03 ENCOUNTER — Encounter (INDEPENDENT_AMBULATORY_CARE_PROVIDER_SITE_OTHER): Payer: Self-pay | Admitting: Orthopaedic Surgery

## 2018-11-03 ENCOUNTER — Encounter (HOSPITAL_COMMUNITY): Payer: Self-pay | Admitting: Pharmacy Technician

## 2018-11-03 ENCOUNTER — Inpatient Hospital Stay (HOSPITAL_COMMUNITY)
Admission: EM | Admit: 2018-11-03 | Discharge: 2018-11-09 | DRG: 308 | Disposition: A | Discharge status: Home / Self Care | Payer: BLUE CROSS/BLUE SHIELD | Attending: Internal Medicine | Admitting: Internal Medicine

## 2018-11-03 DIAGNOSIS — Z7982 Long term (current) use of aspirin: Secondary | ICD-10-CM

## 2018-11-03 DIAGNOSIS — E282 Polycystic ovarian syndrome: Secondary | ICD-10-CM | POA: Diagnosis present

## 2018-11-03 DIAGNOSIS — Z91041 Radiographic dye allergy status: Secondary | ICD-10-CM

## 2018-11-03 DIAGNOSIS — I4891 Unspecified atrial fibrillation: Secondary | ICD-10-CM | POA: Diagnosis not present

## 2018-11-03 DIAGNOSIS — E669 Obesity, unspecified: Secondary | ICD-10-CM | POA: Diagnosis not present

## 2018-11-03 DIAGNOSIS — Z833 Family history of diabetes mellitus: Secondary | ICD-10-CM

## 2018-11-03 DIAGNOSIS — E1169 Type 2 diabetes mellitus with other specified complication: Secondary | ICD-10-CM

## 2018-11-03 DIAGNOSIS — D631 Anemia in chronic kidney disease: Secondary | ICD-10-CM | POA: Diagnosis present

## 2018-11-03 DIAGNOSIS — G4730 Sleep apnea, unspecified: Secondary | ICD-10-CM | POA: Diagnosis present

## 2018-11-03 DIAGNOSIS — M1612 Unilateral primary osteoarthritis, left hip: Secondary | ICD-10-CM

## 2018-11-03 DIAGNOSIS — E785 Hyperlipidemia, unspecified: Secondary | ICD-10-CM | POA: Diagnosis not present

## 2018-11-03 DIAGNOSIS — E119 Type 2 diabetes mellitus without complications: Secondary | ICD-10-CM

## 2018-11-03 DIAGNOSIS — R072 Precordial pain: Secondary | ICD-10-CM | POA: Diagnosis present

## 2018-11-03 DIAGNOSIS — J45909 Unspecified asthma, uncomplicated: Secondary | ICD-10-CM | POA: Diagnosis present

## 2018-11-03 DIAGNOSIS — Z8249 Family history of ischemic heart disease and other diseases of the circulatory system: Secondary | ICD-10-CM | POA: Diagnosis not present

## 2018-11-03 DIAGNOSIS — Z90710 Acquired absence of both cervix and uterus: Secondary | ICD-10-CM

## 2018-11-03 DIAGNOSIS — Z992 Dependence on renal dialysis: Secondary | ICD-10-CM

## 2018-11-03 DIAGNOSIS — Z96641 Presence of right artificial hip joint: Secondary | ICD-10-CM | POA: Diagnosis not present

## 2018-11-03 DIAGNOSIS — M25551 Pain in right hip: Secondary | ICD-10-CM | POA: Diagnosis not present

## 2018-11-03 DIAGNOSIS — R079 Chest pain, unspecified: Secondary | ICD-10-CM | POA: Diagnosis present

## 2018-11-03 DIAGNOSIS — I959 Hypotension, unspecified: Secondary | ICD-10-CM | POA: Diagnosis not present

## 2018-11-03 DIAGNOSIS — I120 Hypertensive chronic kidney disease with stage 5 chronic kidney disease or end stage renal disease: Secondary | ICD-10-CM | POA: Diagnosis present

## 2018-11-03 DIAGNOSIS — N2581 Secondary hyperparathyroidism of renal origin: Secondary | ICD-10-CM | POA: Diagnosis present

## 2018-11-03 DIAGNOSIS — Z91013 Allergy to seafood: Secondary | ICD-10-CM

## 2018-11-03 DIAGNOSIS — M25552 Pain in left hip: Secondary | ICD-10-CM

## 2018-11-03 DIAGNOSIS — I25119 Atherosclerotic heart disease of native coronary artery with unspecified angina pectoris: Secondary | ICD-10-CM | POA: Diagnosis not present

## 2018-11-03 DIAGNOSIS — R791 Abnormal coagulation profile: Secondary | ICD-10-CM | POA: Diagnosis present

## 2018-11-03 DIAGNOSIS — I2510 Atherosclerotic heart disease of native coronary artery without angina pectoris: Secondary | ICD-10-CM | POA: Diagnosis present

## 2018-11-03 DIAGNOSIS — Z79899 Other long term (current) drug therapy: Secondary | ICD-10-CM

## 2018-11-03 DIAGNOSIS — M1990 Unspecified osteoarthritis, unspecified site: Secondary | ICD-10-CM | POA: Diagnosis present

## 2018-11-03 DIAGNOSIS — E1122 Type 2 diabetes mellitus with diabetic chronic kidney disease: Secondary | ICD-10-CM | POA: Diagnosis present

## 2018-11-03 DIAGNOSIS — I248 Other forms of acute ischemic heart disease: Secondary | ICD-10-CM | POA: Diagnosis not present

## 2018-11-03 DIAGNOSIS — I82409 Acute embolism and thrombosis of unspecified deep veins of unspecified lower extremity: Secondary | ICD-10-CM | POA: Diagnosis present

## 2018-11-03 DIAGNOSIS — Z6838 Body mass index (BMI) 38.0-38.9, adult: Secondary | ICD-10-CM

## 2018-11-03 DIAGNOSIS — I82411 Acute embolism and thrombosis of right femoral vein: Secondary | ICD-10-CM | POA: Diagnosis not present

## 2018-11-03 DIAGNOSIS — I4811 Longstanding persistent atrial fibrillation: Secondary | ICD-10-CM | POA: Diagnosis not present

## 2018-11-03 DIAGNOSIS — E8889 Other specified metabolic disorders: Secondary | ICD-10-CM | POA: Diagnosis not present

## 2018-11-03 DIAGNOSIS — N186 End stage renal disease: Secondary | ICD-10-CM | POA: Diagnosis present

## 2018-11-03 DIAGNOSIS — Z888 Allergy status to other drugs, medicaments and biological substances status: Secondary | ICD-10-CM

## 2018-11-03 DIAGNOSIS — R0989 Other specified symptoms and signs involving the circulatory and respiratory systems: Secondary | ICD-10-CM | POA: Diagnosis not present

## 2018-11-03 DIAGNOSIS — I340 Nonrheumatic mitral (valve) insufficiency: Secondary | ICD-10-CM | POA: Diagnosis not present

## 2018-11-03 DIAGNOSIS — Z841 Family history of disorders of kidney and ureter: Secondary | ICD-10-CM

## 2018-11-03 DIAGNOSIS — I480 Paroxysmal atrial fibrillation: Secondary | ICD-10-CM | POA: Diagnosis not present

## 2018-11-03 LAB — CBC
HCT: 39.3 % (ref 36.0–46.0)
Hemoglobin: 12.2 g/dL (ref 12.0–15.0)
MCH: 31.5 pg (ref 26.0–34.0)
MCHC: 31 g/dL (ref 30.0–36.0)
MCV: 101.6 fL — ABNORMAL HIGH (ref 80.0–100.0)
Platelets: 197 10*3/uL (ref 150–400)
RBC: 3.87 MIL/uL (ref 3.87–5.11)
RDW: 15.8 % — ABNORMAL HIGH (ref 11.5–15.5)
WBC: 9.2 10*3/uL (ref 4.0–10.5)
nRBC: 0 % (ref 0.0–0.2)

## 2018-11-03 LAB — BASIC METABOLIC PANEL
BUN: 39 mg/dL — ABNORMAL HIGH (ref 8–23)
Chloride: 94 mmol/L — ABNORMAL LOW (ref 98–111)
GFR calc Af Amer: 5 mL/min — ABNORMAL LOW (ref >60)
GFR calc non Af Amer: 4 mL/min — ABNORMAL LOW (ref >60)
Potassium: 4.1 mmol/L (ref 3.5–5.1)
Sodium: 137 mmol/L (ref 135–145)

## 2018-11-03 LAB — I-STAT TROPONIN, ED: Troponin i, poc: 0 ng/mL (ref 0.00–0.08)

## 2018-11-03 LAB — BASIC METABOLIC PANEL WITH GFR
Anion gap: 18 — ABNORMAL HIGH (ref 5–15)
CO2: 25 mmol/L (ref 22–32)
Calcium: 9.1 mg/dL (ref 8.9–10.3)
Creatinine, Ser: 8.81 mg/dL — ABNORMAL HIGH (ref 0.44–1.00)
Glucose, Bld: 93 mg/dL (ref 70–99)

## 2018-11-03 LAB — TROPONIN I: Troponin I: 0.08 ng/mL (ref <0.03)

## 2018-11-03 LAB — TSH: TSH: 0.497 u[IU]/mL (ref 0.350–4.500)

## 2018-11-03 MED ORDER — ETHYL CHLORIDE EX AERO: 1.0000 "application " | INHALATION_SPRAY | CUTANEOUS | Status: DC

## 2018-11-03 MED ORDER — DIPHENHYDRAMINE HCL 50 MG/ML IJ SOLN
50.0000 mg | Freq: Once | INTRAMUSCULAR | Status: AC
  Administered 2018-11-03: 50 mg via INTRAVENOUS
  Filled 2018-11-03: qty 1

## 2018-11-03 MED ORDER — GABAPENTIN 100 MG PO CAPS
100.0000 mg | ORAL_CAPSULE | Freq: Every day | ORAL | Status: DC
  Administered 2018-11-03 – 2018-11-08 (×6): 100 mg via ORAL
  Filled 2018-11-03 (×6): qty 1

## 2018-11-03 MED ORDER — DIPHENHYDRAMINE HCL 25 MG PO CAPS
50.0000 mg | ORAL_CAPSULE | Freq: Once | ORAL | Status: AC
  Filled 2018-11-03: qty 2

## 2018-11-03 MED ORDER — SUCROFERRIC OXYHYDROXIDE 500 MG PO CHEW
1000.0000 mg | CHEWABLE_TABLET | Freq: Every day | ORAL | Status: DC | PRN
  Filled 2018-11-03: qty 2

## 2018-11-03 MED ORDER — ONDANSETRON HCL 4 MG/2ML IJ SOLN
4.0000 mg | Freq: Once | INTRAMUSCULAR | Status: AC
  Administered 2018-11-03: 4 mg via INTRAVENOUS
  Filled 2018-11-03: qty 2

## 2018-11-03 MED ORDER — SODIUM CHLORIDE 0.9% FLUSH
3.0000 mL | Freq: Two times a day (BID) | INTRAVENOUS | Status: DC
  Administered 2018-11-03 – 2018-11-06 (×4): 3 mL via INTRAVENOUS

## 2018-11-03 MED ORDER — ALBUTEROL SULFATE (2.5 MG/3ML) 0.083% IN NEBU: 2.5000 mg | INHALATION_SOLUTION | RESPIRATORY_TRACT | Status: DC | PRN

## 2018-11-03 MED ORDER — RENA-VITE PO TABS
1.0000 | ORAL_TABLET | Freq: Every day | ORAL | Status: DC
  Administered 2018-11-03 – 2018-11-08 (×6): 1 via ORAL
  Filled 2018-11-03 (×7): qty 1

## 2018-11-03 MED ORDER — OXYCODONE HCL 5 MG PO TABS
5.0000 mg | ORAL_TABLET | ORAL | Status: DC | PRN
  Administered 2018-11-06 – 2018-11-07 (×2): 15 mg via ORAL
  Filled 2018-11-03 (×2): qty 3

## 2018-11-03 MED ORDER — DILTIAZEM HCL-DEXTROSE 100-5 MG/100ML-% IV SOLN (PREMIX)
5.0000 mg/h | INTRAVENOUS | Status: DC
  Administered 2018-11-03: 5 mg/h via INTRAVENOUS
  Filled 2018-11-03 (×2): qty 100

## 2018-11-03 MED ORDER — HEPARIN BOLUS VIA INFUSION
4000.0000 [IU] | Freq: Once | INTRAVENOUS | Status: AC
  Administered 2018-11-03: 4000 [IU] via INTRAVENOUS
  Filled 2018-11-03: qty 4000

## 2018-11-03 MED ORDER — HYDROCORTISONE NA SUCCINATE PF 250 MG IJ SOLR
200.0000 mg | Freq: Once | INTRAMUSCULAR | Status: AC
  Administered 2018-11-03: 200 mg via INTRAVENOUS
  Filled 2018-11-03: qty 200

## 2018-11-03 MED ORDER — HEPARIN (PORCINE) 25000 UT/250ML-% IV SOLN
1250.0000 [IU]/h | INTRAVENOUS | Status: DC
  Administered 2018-11-03: 1050 [IU]/h via INTRAVENOUS
  Administered 2018-11-04 – 2018-11-09 (×6): 1250 [IU]/h via INTRAVENOUS
  Filled 2018-11-03 (×7): qty 250

## 2018-11-03 MED ORDER — DIALYVITE PO TABS: 1.0000 | ORAL_TABLET | Freq: Every day | ORAL | Status: DC

## 2018-11-03 MED ORDER — ACETAMINOPHEN 325 MG PO TABS
650.0000 mg | ORAL_TABLET | Freq: Four times a day (QID) | ORAL | Status: DC | PRN
  Administered 2018-11-07: 650 mg via ORAL
  Filled 2018-11-03: qty 2

## 2018-11-03 MED ORDER — ONDANSETRON HCL 4 MG PO TABS: 4.0000 mg | ORAL_TABLET | Freq: Four times a day (QID) | ORAL | Status: DC | PRN

## 2018-11-03 MED ORDER — ACETAMINOPHEN 650 MG RE SUPP: 650.0000 mg | Freq: Four times a day (QID) | RECTAL | Status: DC | PRN

## 2018-11-03 MED ORDER — ONDANSETRON HCL 4 MG/2ML IJ SOLN: 4.0000 mg | Freq: Four times a day (QID) | INTRAMUSCULAR | Status: DC | PRN

## 2018-11-03 MED ORDER — DILTIAZEM LOAD VIA INFUSION
20.0000 mg | Freq: Once | INTRAVENOUS | Status: AC
  Administered 2018-11-03: 20 mg via INTRAVENOUS
  Filled 2018-11-03: qty 20

## 2018-11-03 MED ORDER — SODIUM CHLORIDE 0.9 % IV BOLUS
250.0000 mL | Freq: Once | INTRAVENOUS | Status: AC
  Administered 2018-11-03: 250 mL via INTRAVENOUS

## 2018-11-03 MED ORDER — SUCROFERRIC OXYHYDROXIDE 500 MG PO CHEW
1000.0000 mg | CHEWABLE_TABLET | Freq: Three times a day (TID) | ORAL | Status: DC
  Administered 2018-11-04 – 2018-11-09 (×13): 1000 mg via ORAL
  Filled 2018-11-03 (×18): qty 2

## 2018-11-03 NOTE — Progress Notes (Addendum)
ANTICOAGULATION CONSULT NOTE - Initial Consult  Pharmacy Consult for heparin Indication: atrial fibrillation / new bilateral DVTs  Allergies  Allergen Reactions  . Iodine Shortness Of Breath  . Shellfish Allergy Shortness Of Breath  . Norvasc [Amlodipine] Swelling    SWELLING REACTION UNSPECIFIED     Patient Measurements: Height: 5\' 5"  (165.1 cm) Weight: 225 lb (102.1 kg) IBW/kg (Calculated) : 57 Heparin Dosing Weight: 80 kg   Vital Signs: Temp: 97.5 F (36.4 C) (12/03 1521) Temp Source: Oral (12/03 1521) BP: 92/68 (12/03 1630) Pulse Rate: 112 (12/03 1521)  Labs: Recent Labs    11/03/18 1529  HGB 12.2  HCT 39.3  PLT 197  CREATININE 8.81*    Estimated Creatinine Clearance: 7.9 mL/min (A) (by C-G formula based on SCr of 8.81 mg/dL (H)).   Medical History: Past Medical History:  Diagnosis Date  . Anemia   . Arthritis   . Asthma   . Complication of anesthesia    difficulty with getting oxygen saturation up  . Diabetes mellitus   . Dyslipidemia   . Epistaxis 11/05/2012  . ESRD (end stage renal disease) on dialysis [facility])    "MWF; Jeneen Rinks" (09/15/2018)  . History of nuclear stress test    Myoview 5/18: EF 68, no infarct or ischemia, low risk  . HTN (hypertension)   . Hyperlipidemia   . Hyperparathyroidism due to renal insufficiency ([place])   . Obesity    s/p panniculectomy  . Paroxysmal A-fib (HCC)    a. chronic coumadin;  b. 12/2009 Echo: EF 60-65%, Gr 1 DD.  Marland Kitchen PCOS (polycystic ovarian syndrome)   . Pneumonia   . Seasonal allergies   . Sleep apnea    a. not using CPAP, last study  >8 yrs  . Vitamin D deficiency     Medications:   (Not in a hospital admission)  Assessment: 45 YOF who presented with chest pain. She has a h/o of Afib not on anticoagulation. Currently on Afib with RVR. Pharmacy consulted to start IV heparin. H/H and Plt wnl.   Goal of Therapy:  Heparin level 0.3-0.7 units/ml Monitor platelets by anticoagulation protocol: Yes    Plan:  -Heparin 4000 units IV bolus, then start IV heparin infusion at 1050 units/hr -F/u 6 hr HL -Monitor daily HL, CBC and s/s of bleeding   Albertina Parr, PharmD., BCPS Clinical Pharmacist Clinical phone for 11/03/18 until 11pm: [phone]

## 2018-11-03 NOTE — H&P (Addendum)
History and Physical    Brenda Arnold LDJ:[id number] DOB: 03/08/1956 DOA: 11/03/2018  Referring MD/NP/PA: Norlene Duel, MD(resident) PCP: Kelton Pillar, MD  Patient coming from: Private vehicle  Chief Complaint: Chest pain  I have personally briefly reviewed patient's old medical records in [place]   HPI: Brenda Arnold is a 62 y.o. female with medical history significant of ESRD on HD, PAF, HTN, HLD, DM type 2, and hypotension; who presents with acute onset chest pain.  She had undergone an elective right hip replacement by Dr. Erlinda Hong on 10/15, and was placed on 81 mg of aspirin following the procedure.  Patient reports taking this medication as advised and had been doing well.  She had gone for a follow-up appointment today, and to also discuss left hip replacement.  During the appointment she reports having acute onset of substernal burning pain with chest tightness making it difficult for her to breathe.  She initially thought symptoms may be related to the reflux, but symptoms did not improve.  She was advised to go to the emergency department. She had shorter episodes once or twice before last week or so.  Denies having any leg swelling, calf pain, loss of consciousness, previous blood clots, fever, chills, or diaphoresis.  Other associated symptoms include some left hip pain and palpitation.  Patient states that she was previously on Coumadin for many years, but had an episode in February 2018 where she had prolonged epistaxis due to INR being elevated to 3.8.  Thereafter, patient reports that her primary care provider took her off the Coumadin approximately a year ago.  She denies any other issues with bleeding.  Last episode of atrial fibrillation was over year ago.  She last had a full hemodialysis session on Monday and is due to have a hemodialysis tomorrow.  For the last 6 to 8 months she states that she has been having issues with low blood pressures normally running in  the 80s over 40s.  ED Course: Upon admission into the emergency department patient was noted to be patient was noted to be in A. fib with RVR with heart rates into the 160s with blood pressures as low as 66/49.  Due to patient previously not being on anticoagulation cardioversion was contraindicated.  Patient received 200 mg of hydrocortisone, and 250 mL bolus of normal saline IV fluids for blood pressures.  Initial troponin 0.  Patient had been started on Cardizem drip.  Vascular Doppler ultrasound of lower extremities revealed acute DVT of the right common femoral, popliteal, posterior tibial vein.  Patient was started on heparin drip.  Critical care was consulted as well as cardiology due to patient's symptoms.  Patient did return to sinus rhythm.  CT angiogram of the chest was pending due to reported iodine allergy..  Patient was pretreated with Benadryl.  TRH called to admit.  Blood pressure still noted to be low but mildly improved last noted to be 93/54.  Patient reported having one episode of nausea in the ED, but currently reports being chest pain-free.  Review of Systems  Constitutional: Negative for chills, fever and weight loss.  HENT: Negative for ear discharge and nosebleeds.   Eyes: Negative for photophobia and pain.  Respiratory: Negative for cough and wheezing.   Cardiovascular: Positive for chest pain and palpitations. Negative for leg swelling.  Gastrointestinal: Positive for nausea. Negative for abdominal pain and diarrhea.  Genitourinary: Negative for flank pain and urgency.  Musculoskeletal: Positive for joint pain and myalgias. Negative  for falls.  Skin: Negative for itching.  Neurological: Negative for seizures and loss of consciousness.  Endo/Heme/Allergies: Negative for polydipsia.  Psychiatric/Behavioral: Negative for substance abuse and suicidal ideas.    Past Medical History:  Diagnosis Date  . Anemia   . Arthritis   . Asthma   . Complication of anesthesia     difficulty with getting oxygen saturation up  . Diabetes mellitus   . Dyslipidemia   . Epistaxis 11/05/2012  . ESRD (end stage renal disease) on dialysis [facility])    "MWF; [facility]" (09/15/2018)  . History of nuclear stress test    Myoview 5/18: EF 68, no infarct or ischemia, low risk  . HTN (hypertension)   . Hyperlipidemia   . Hyperparathyroidism due to renal insufficiency ([place])   . Obesity    s/p panniculectomy  . Paroxysmal A-fib ([facility])    a. chronic coumadin;  b. 12/2009 Echo: EF 60-65%, Gr 1 DD.  Marland Kitchen PCOS (polycystic ovarian syndrome)   . Pneumonia   . Seasonal allergies   . Sleep apnea    a. not using CPAP, last study  >8 yrs  . Vitamin D deficiency     Past Surgical History:  Procedure Laterality Date  . ABDOMINAL HYSTERECTOMY     with panniculctomy  . AV FISTULA PLACEMENT  09/02/2012   Procedure: ARTERIOVENOUS (AV) FISTULA CREATION;  Surgeon: Elam Dutch, MD;  Location: [facility] OR;  Service: Vascular;  Laterality: Left;  Creation of Left Radial-Cephalic Fistula   . BREAST SURGERY     Biopsy right breast  . COLONOSCOPY W/ BIOPSIES AND POLYPECTOMY    . DILATION AND CURETTAGE OF UTERUS    . KNEE ARTHROSCOPY Right   . REVISON OF ARTERIOVENOUS FISTULA Left 04/27/2014   Procedure: REVISON OF LEFT RADIAL-CEPHALIC ARTERIOVENOUS FISTULA;  Surgeon: Mal Misty, MD;  Location: [place];  Service: Vascular;  Laterality: Left;  . TOTAL HIP ARTHROPLASTY Right 09/15/2018  . TOTAL HIP ARTHROPLASTY Right 09/15/2018   Procedure: RIGHT TOTAL HIP ARTHROPLASTY ANTERIOR APPROACH;  Surgeon: Leandrew Koyanagi, MD;  Location: [place];  Service: Orthopedics;  Laterality: Right;  . UVULOPLASTY       reports that she has never smoked. She has never used smokeless tobacco. She reports that she does not drink alcohol or use drugs.  Allergies  Allergen Reactions  . Iodine Shortness Of Breath  . Shellfish Allergy Shortness Of Breath  . Norvasc [Amlodipine] Swelling    SWELLING REACTION UNSPECIFIED      Family History  Problem Relation Age of Onset  . Lung cancer Father        died @ 70  . Hypertension Mother        alive @ 51  . Diabetes Brother   . Kidney disease Brother     Prior to Admission medications   Medication Sig Start Date End Date Taking? Authorizing Provider  aspirin EC 81 MG tablet Take 1 tablet (81 mg total) by mouth 2 (two) times daily. 09/15/18  Yes Leandrew Koyanagi, MD  B Complex-C-Folic Acid (DIALYVITE TABLET) TABS Take 1 tablet by mouth at bedtime.  06/03/18  Yes [provider]  ethyl chloride spray Apply 1 application topically See admin instructions. For dialysis 06/25/18  Yes [provider]  gabapentin (NEURONTIN) 100 MG capsule Take 100 mg by mouth at bedtime. 05/14/17  Yes [provider]  methocarbamol (ROBAXIN) 500 MG tablet Take 1 tablet (500 mg total) by mouth every 6 (six) hours as needed  for muscle spasms. 09/15/18  Yes Leandrew Koyanagi, MD  ondansetron (ZOFRAN) 4 MG tablet Take 1-2 tablets (4-8 mg total) by mouth every 8 (eight) hours as needed for nausea or vomiting. 09/15/18  Yes Leandrew Koyanagi, MD  oxyCODONE (OXY IR/ROXICODONE) 5 MG immediate release tablet Take 1-3 tablets (5-15 mg total) by mouth every 4 (four) hours as needed. 09/15/18  Yes Leandrew Koyanagi, MD  promethazine (PHENERGAN) 25 MG tablet Take 1 tablet (25 mg total) by mouth every 6 (six) hours as needed for nausea. 09/15/18  Yes Leandrew Koyanagi, MD  senna-docusate (SENOKOT S) 8.6-50 MG tablet Take 1 tablet by mouth at bedtime as needed. 09/15/18  Yes Leandrew Koyanagi, MD  sucroferric oxyhydroxide (VELPHORO) 500 MG chewable tablet Chew 2 capsules by mouth See admin instructions. 2 each three times daily with meals, and 2 for 1 daily snack   Yes [provider]  sulfamethoxazole-trimethoprim (BACTRIM) 400-80 MG tablet Take 1 tablet by mouth at bedtime. Patient not taking: Reported on 11/03/2018 09/16/18   Leandrew Koyanagi, MD    Physical Exam:  Constitutional: Elderly  female in NAD, calm, comfortable Vitals:   11/03/18 1912 11/03/18 1915 11/03/18 1945 11/03/18 2000  BP: (!) 84/49 (!) 87/43 (!) 83/58 (!) 93/54  Pulse:   97 89  Resp: 15 12 20  (!) 22  Temp:      TempSrc:      SpO2:   98% 97%  Weight:      Height:       Eyes: PERRL, lids and conjunctivae normal ENMT: Mucous membranes are moist. Posterior pharynx clear of any exudate or lesions.  Neck: normal, supple, no masses, no thyromegaly Respiratory: clear to auscultation bilaterally, no wheezing, no crackles. Normal respiratory effort. No accessory muscle use.  Cardiovascular: Regular rate and rhythm, no murmurs / rubs / gallops. No extremity edema. 2+ pedal pulses. No carotid bruits.  Fistula of the left upper extremity with palpable thrill present. Abdomen: no tenderness, no masses palpated. No hepatosplenomegaly. Bowel sounds positive.  Musculoskeletal: no clubbing / cyanosis. No joint deformity upper and lower extremities. Good ROM, no contractures. Normal muscle tone.  Skin: no rashes, lesions, ulcers. No induration Neurologic: CN 2-12 grossly intact. Sensation intact, DTR normal. Strength 5/5 in all 4.  Psychiatric: Normal judgment and insight. Alert and oriented x 3. Normal mood.     Labs on Admission: I have personally reviewed following labs and imaging studies  CBC: Recent Labs  Lab 11/03/18 1529  WBC 9.2  HGB 12.2  HCT 39.3  MCV 101.6*  PLT 160   Basic Metabolic Panel: Recent Labs  Lab 11/03/18 1529  NA 137  K 4.1  CL 94*  CO2 25  GLUCOSE 93  BUN 39*  CREATININE 8.81*  CALCIUM 9.1   GFR: Estimated Creatinine Clearance: 7.9 mL/min (A) (by C-G formula based on SCr of 8.81 mg/dL (H)). Liver Function Tests: No results for input(s): AST, ALT, ALKPHOS, BILITOT, PROT, ALBUMIN in the last 168 hours. No results for input(s): LIPASE, AMYLASE in the last 168 hours. No results for input(s): AMMONIA in the last 168 hours. Coagulation Profile: No results for input(s): INR,  PROTIME in the last 168 hours. Cardiac Enzymes: No results for input(s): CKTOTAL, CKMB, CKMBINDEX, TROPONINI in the last 168 hours. BNP (last 3 results) No results for input(s): PROBNP in the last 8760 hours. HbA1C: No results for input(s): HGBA1C in the last 72 hours. CBG: No results for input(s): GLUCAP in the last  168 hours. Lipid Profile: No results for input(s): CHOL, HDL, LDLCALC, TRIG, CHOLHDL, LDLDIRECT in the last 72 hours. Thyroid Function Tests: No results for input(s): TSH, T4TOTAL, FREET4, T3FREE, THYROIDAB in the last 72 hours. Anemia Panel: No results for input(s): VITAMINB12, FOLATE, FERRITIN, TIBC, IRON, RETICCTPCT in the last 72 hours. Urine analysis:    Component Value Date/Time   COLORURINE YELLOW 11/05/2012 2354   APPEARANCEUR CLEAR 11/05/2012 2354   LABSPEC 1.016 11/05/2012 2354   PHURINE 5.5 11/05/2012 2354   GLUCOSEU NEGATIVE 11/05/2012 2354   HGBUR SMALL (A) 11/05/2012 2354   BILIRUBINUR NEGATIVE 11/05/2012 2354   KETONESUR NEGATIVE 11/05/2012 2354   PROTEINUR 100 (A) 11/05/2012 2354   UROBILINOGEN 0.2 11/05/2012 2354   NITRITE NEGATIVE 11/05/2012 2354   LEUKOCYTESUR NEGATIVE 11/05/2012 2354   Sepsis Labs: No results found for this or any previous visit (from the past 240 hour(s)).   Radiological Exams on Admission: Dg Chest Port 1 View  Result Date: 11/03/2018 CLINICAL DATA:  Acute chest pain today. EXAM: PORTABLE CHEST 1 VIEW COMPARISON:  04/27/2014 and prior exams FINDINGS: UPPER limits normal heart size and pulmonary vascular congestion noted. Mild bibasilar atelectasis identified. There is no evidence of focal airspace disease, pulmonary edema, suspicious pulmonary nodule/mass, pleural effusion, or pneumothorax. No acute bony abnormalities are identified. IMPRESSION: UPPER limits normal heart size with pulmonary vascular congestion. Electronically Signed   By: Margarette Canada M.D.   On: 11/03/2018 16:40   Xr Hip Unilat W Or W/o Pelvis 2-3 Views  Left  Result Date: 11/03/2018 Severe degenerative joint disease of her left hip secondary to avascular necrosis  Xr Hip Unilat W Or W/o Pelvis 2-3 Views Right  Result Date: 11/03/2018 Stable appearing right total hip replacement without complication.  Vas [place] Lower Extremity Venous (dvt) (only [facility])  Result Date: 11/03/2018  Lower Venous Study Indications: Tachycardia.  Performing Technologist: Abram Sander RVS  Examination Guidelines: A complete evaluation includes B-mode imaging, spectral Doppler, color Doppler, and power Doppler as needed of all accessible portions of each vessel. Bilateral testing is considered an integral part of a complete examination. Limited examinations for reoccurring indications may be performed as noted.  Right Venous Findings: +---------+---------------+---------+-----------+----------+--------------+          CompressibilityPhasicitySpontaneityPropertiesSummary        +---------+---------------+---------+-----------+----------+--------------+ CFV      None           No       No                                  +---------+---------------+---------+-----------+----------+--------------+ SFJ      Full                                                        +---------+---------------+---------+-----------+----------+--------------+ FV Prox  None                                                        +---------+---------------+---------+-----------+----------+--------------+ FV Mid   None                                                        +---------+---------------+---------+-----------+----------+--------------+  FV DistalNone                                                        +---------+---------------+---------+-----------+----------+--------------+ PFV      Full                                                        +---------+---------------+---------+-----------+----------+--------------+ POP      None           No        No                                  +---------+---------------+---------+-----------+----------+--------------+ PTV      None                                                        +---------+---------------+---------+-----------+----------+--------------+ PERO                                                  not visualized +---------+---------------+---------+-----------+----------+--------------+  Left Venous Findings: +---------+---------------+---------+-----------+----------+-------+          CompressibilityPhasicitySpontaneityPropertiesSummary +---------+---------------+---------+-----------+----------+-------+ CFV      Full           Yes      Yes                          +---------+---------------+---------+-----------+----------+-------+ SFJ      Full                                                 +---------+---------------+---------+-----------+----------+-------+ FV Prox  Full                                                 +---------+---------------+---------+-----------+----------+-------+ FV Mid   Full                                                 +---------+---------------+---------+-----------+----------+-------+ FV DistalFull                                                 +---------+---------------+---------+-----------+----------+-------+ PFV      Full                                                 +---------+---------------+---------+-----------+----------+-------+  POP      Full           Yes      Yes                          +---------+---------------+---------+-----------+----------+-------+ PTV      Full                                                 +---------+---------------+---------+-----------+----------+-------+ PERO     Full                                                 +---------+---------------+---------+-----------+----------+-------+    Summary: Right: Findings consistent with acute  deep vein thrombosis involving the right common femoral vein, right femoral vein, right popliteal vein, and right posterior tibial vein. Left: There is no evidence of deep vein thrombosis in the lower extremity. No cystic structure found in the popliteal fossa.  *See table(s) above for measurements and observations.    Preliminary     EKG: Independently reviewed.  Atrial fibrillation with RVR at 153  Assessment/Plan Chest pain/Question pulmonary embolus and DVT: Acute.  Patient presents with acute onset of centralized chest pain.  Initial troponin 0.  Found to have acute DVT of the right lower extremity.  Risk factors include recent surgery for which patient had been on aspirin as prevention. - Admit to stepdown bed - Continue heparin gtt per pharmacy - Follow-up CT angiogram of the chest - Trend cardiac troponins - Check echocardiogram in a.m. - Determine best anticoagulation regimen  A. fib with RVR: Now resolved.  Patient presented in A. fib with RVR with heart rates into the 160s.  Known history of paroxysmal atrial fibrillation.  Suspect secondary to possible blood clot.  Converted back to normal sinus rhythm after being placed on diltiazem. CHA2DS2-VASc score = at least 3. - Continue to Cardizem drip and titrate to off when able - Message sent for cardiology to eval in a.m.  Hypotension: Acute on chronic.  Initially blood pressure noted be as low as 66/49, but improved after 250 mL bolus.  - Continue to monitor and bolus IV fluids as needed.  ESRD on HD: Patient normally dialyzes Monday, Wednesday, Friday.  Creatinine 8.81 and BUN 39. - Continue Velphro - Will need to consult nephrology in a.m.  Diabetes mellitus type 2: Diet controlled.  Last hemoglobin A1c 4.9 on 07/21/2018. - Renal and carb modified diet  DVT prophylaxis: Heparin Code Status: Full Family Communication: None Disposition Plan: Likely discharge home in 1 to 2 days Consults called: PCCM Admission status:  Observation  Norval Morton MD [facility] Pager [phone]   If 7PM-7AM, please contact night-coverage [email or web address] Password [email or web address]  11/03/2018, 8:08 PM

## 2018-11-03 NOTE — ED Notes (Signed)
Brenda Dy Booher-Sister- [phone]

## 2018-11-03 NOTE — ED Triage Notes (Signed)
Pt arrives POV with CP onset today. Central, non radiating. Denies SOB, nausea, vomiting. Pt with hip replacement sx 6 weeks ago. Orthopedic MD sent pt here for eval.

## 2018-11-03 NOTE — ED Notes (Signed)
Disconnected/Paused Cardizem drip per Dr. Ralene Bathe for CTA. Patient transported to CT.

## 2018-11-03 NOTE — Progress Notes (Signed)
Office Visit Note   Patient: Brenda Arnold           Date of Birth: July 29, 1956           MRN: [id number] Visit Date: 11/03/2018              Requested by: Kelton Pillar, MD [address] [place], [place] [address] PCP: Kelton Pillar, MD   Assessment & Plan: Visit Diagnoses:  1. Bilateral hip pain   2. Primary osteoarthritis of left hip   3. Status post total replacement of right hip     Plan: Impression is 6 weeks status post right total hip replacement and severe degenerative joint disease left hip.  Patient would like to proceed with a left total hip replacement as soon as possible.  We will schedule her on Tuesday so that it fits well with her dialysis schedule.  She did start to have chest pain during the visit for which we sent her to the ED emergently for.  If this work-up is negative for acute cardiac event we will proceed with scheduling otherwise we will have to postpone her hip replacement until later time.  Follow-Up Instructions: Return if symptoms worsen or fail to improve.   Orders:  Orders Placed This Encounter  Procedures  . XR HIP UNILAT W OR W/O PELVIS 2-3 VIEWS RIGHT  . XR HIP UNILAT W OR W/O PELVIS 2-3 VIEWS LEFT   No orders of the defined types were placed in this encounter.     Procedures: No procedures performed   Clinical Data: No additional findings.   Subjective: Chief Complaint  Patient presents with  . Right Hip - Pain, Follow-up    Brenda Arnold is 6 weeks status post right total hip replacement.  She has done very well from the surgery.  She has severe pain in her left hip and would like to have this replaced as soon as possible.  She had no postoperative complications.   Review of Systems  Constitutional: Negative.   HENT: Negative.   Eyes: Negative.   Respiratory: Negative.   Cardiovascular: Negative.   Endocrine: Negative.   Musculoskeletal: Negative.   Neurological: Negative.   Hematological: Negative.     Psychiatric/Behavioral: Negative.   All other systems reviewed and are negative.    Objective: Vital Signs: There were no vitals taken for this visit.  Physical Exam  Constitutional: She is oriented to person, place, and time. She appears well-developed and well-nourished.  Pulmonary/Chest: Effort normal.  Neurological: She is alert and oriented to person, place, and time.  Skin: Skin is warm. Capillary refill takes less than 2 seconds.  Psychiatric: She has a normal mood and affect. Her behavior is normal. Judgment and thought content normal.  Nursing note and vitals reviewed.   Ortho Exam Right hip exam shows a fully healed surgical scar.  Painless rotation of the hip. Left hip exam shows minimal range of motion with severe pain with rotation. Specialty Comments:  No specialty comments available.  Imaging: Xr Hip Unilat W Or W/o Pelvis 2-3 Views Left  Result Date: 11/03/2018 Severe degenerative joint disease of her left hip secondary to avascular necrosis  Xr Hip Unilat W Or W/o Pelvis 2-3 Views Right  Result Date: 11/03/2018 Stable appearing right total hip replacement without complication.    PMFS History: Patient Active Problem List   Diagnosis Date Noted  . Primary osteoarthritis of right hip 09/15/2018  . Status post total replacement of right hip 09/15/2018  .  Varicose veins of bilateral lower extremities with other complications 69/62/9528  . Anticoagulation goal of INR 2 to 3 09/30/2014  . Pain in lower limb 05/02/2014  . Onychomycosis due to dermatophyte 04/06/2013  . Pain in joint, ankle and foot 04/06/2013  . Bradycardia 11/06/2012  . Left-sided epistaxis 11/05/2012  . DM (diabetes mellitus) ([place]) 11/05/2012  . OSA (obstructive sleep apnea) 11/05/2012  . Acute posterior epistaxis 11/05/2012  . CKD (chronic kidney disease) 11/05/2012  . End stage renal disease ([place]) 06/25/2012  . Type 2 diabetes mellitus with complication, without long-term current use  of insulin ([place]) 12/20/2009  . OBESITY 12/20/2009  . OBESITY-MORBID (>100') 12/20/2009  . Essential hypertension 12/20/2009  . Paroxysmal atrial fibrillation ([place]) 12/20/2009  . CHEST PAIN-UNSPECIFIED 12/20/2009  . ABNORMAL CV (STRESS) TEST 12/20/2009   Past Medical History:  Diagnosis Date  . Anemia   . Arthritis   . Asthma   . Complication of anesthesia    difficulty with getting oxygen saturation up  . Diabetes mellitus   . Dyslipidemia   . Epistaxis 11/05/2012  . ESRD (end stage renal disease) on dialysis [facility])    "MWF; Jene[facility] Rinks" (09/15/2018)  . History of nuclear stress test    Myoview 5/18: EF 68, no infarct or ischemia, low risk  . HTN (hypertension)   . Hyperlipidemia   . Hyperparathyroidism due to renal insufficiency ([place])   . Obesity    s/p panniculectomy  . Paroxysmal A-fib ([facility])    a. chronic coumadin;  b. 12/2009 Echo: EF 60-65%, Gr 1 DD.  Marland Kitchen PCOS (polycystic ovarian syndrome)   . Pneumonia   . Seasonal allergies   . Sleep apnea    a. not using CPAP, last study  >8 yrs  . Vitamin D deficiency     Family History  Problem Relation Age of Onset  . Lung cancer Father        died @ 28  . Hypertension Mother        alive @ 79  . Diabetes Brother   . Kidney disease Brother     Past Surgical History:  Procedure Laterality Date  . ABDOMINAL HYSTERECTOMY     with panniculctomy  . AV FISTULA PLACEMENT  09/02/2012   Procedure: ARTERIOVENOUS (AV) FISTULA CREATION;  Surgeon: Elam Dutch, MD;  Location: [facility] OR;  Service: Vascular;  Laterality: Left;  Creation of Left Radial-Cephalic Fistula   . BREAST SURGERY     Biopsy right breast  . COLONOSCOPY W/ BIOPSIES AND POLYPECTOMY    . DILATION AND CURETTAGE OF UTERUS    . KNEE ARTHROSCOPY Right   . REVISON OF ARTERIOVENOUS FISTULA Left 04/27/2014   Procedure: REVISON OF LEFT RADIAL-CEPHALIC ARTERIOVENOUS FISTULA;  Surgeon: Mal Misty, MD;  Location: [facility];  Service: Vascular;  Laterality: Left;  . TOTAL HIP  ARTHROPLASTY Right 09/15/2018  . TOTAL HIP ARTHROPLASTY Right 09/15/2018   Procedure: RIGHT TOTAL HIP ARTHROPLASTY ANTERIOR APPROACH;  Surgeon: Leandrew Koyanagi, MD;  Location: [facility];  Service: Orthopedics;  Laterality: Right;  . UVULOPLASTY     Social History   Occupational History  . Not on file  Tobacco Use  . Smoking status: Never Smoker  . Smokeless tobacco: Never Used  Substance and Sexual Activity  . Alcohol use: No    Alcohol/week: 0.0 standard drinks  . Drug use: No  . Sexual activity: Not on file

## 2018-11-03 NOTE — Progress Notes (Signed)
Bilateral lower extremity venous.  Preliminary results in CV Proc.  Results given to Rn and Dr.   Abram Sander 11/03/2018 5:48 PM

## 2018-11-03 NOTE — ED Notes (Signed)
Paged critcare to resident

## 2018-11-03 NOTE — ED Provider Notes (Signed)
[facility] EMERGENCY DEPARTMENT Provider Note   CSN: [id number] Arrival date & time: 11/03/18  1511     History   Chief Complaint Chief Complaint  Patient presents with  . Chest Pain    HPI Brenda Arnold is a 62 y.o. female.  With past medical history significant for ESRD on hemodialysis MWF, paroxysmal A. Fib (on no medication), DM, OSA, and rt hip surgery 6 weeks ago, presented with chest pain. It started yesterday but she considered that as indigestion and felt better after a while. Today when in physician office, she experienced similar pain that got worse.This is a burning pain on her substernal area, associated with diaphoresis, with intensity of 6/10, does not radiate. She also reports dizziness. Denies SOB, N/V.   She completed her HD yesterday without any complication. She mentions that she usually have low BP particularly at HD. She has Hx of paroxysmal afib but mentions has been off of anticoagulation by her physician since 2 years ago since did not have more episode. She had a right hip surgery 6 weeks ago. Denies any leg pain or leg swelling.  HPI  Past Medical History:  Diagnosis Date  . Anemia   . Arthritis   . Asthma   . Complication of anesthesia    difficulty with getting oxygen saturation up  . Diabetes mellitus   . Dyslipidemia   . Epistaxis 11/05/2012  . ESRD (end stage renal disease) on dialysis [facility])    "MWF; Jene[facility] Rinks" (09/15/2018)  . History of nuclear stress test    Myoview 5/18: EF 68, no infarct or ischemia, low risk  . HTN (hypertension)   . Hyperlipidemia   . Hyperparathyroidism due to renal insufficiency ([place])   . Obesity    s/p panniculectomy  . Paroxysmal A-fib (HCC)    a. chronic coumadin;  b. 12/2009 Echo: EF 60-65%, Gr 1 DD.  Marland Kitchen PCOS (polycystic ovarian syndrome)   . Pneumonia   . Seasonal allergies   . Sleep apnea    a. not using CPAP, last study  >8 yrs  . Vitamin D deficiency     Patient Active  Problem List   Diagnosis Date Noted  . Primary osteoarthritis of right hip 09/15/2018  . Status post total replacement of right hip 09/15/2018  . Varicose veins of bilateral lower extremities with other complications 89/38/1017  . Anticoagulation goal of INR 2 to 3 09/30/2014  . Pain in lower limb 05/02/2014  . Onychomycosis due to dermatophyte 04/06/2013  . Pain in joint, ankle and foot 04/06/2013  . Bradycardia 11/06/2012  . Left-sided epistaxis 11/05/2012  . DM (diabetes mellitus) ([place]) 11/05/2012  . OSA (obstructive sleep apnea) 11/05/2012  . Acute posterior epistaxis 11/05/2012  . CKD (chronic kidney disease) 11/05/2012  . End stage renal disease ([place]) 06/25/2012  . Type 2 diabetes mellitus with complication, without long-term current use of insulin ([place]) 12/20/2009  . OBESITY 12/20/2009  . OBESITY-MORBID (>100') 12/20/2009  . Essential hypertension 12/20/2009  . Paroxysmal atrial fibrillation ([place]) 12/20/2009  . CHEST PAIN-UNSPECIFIED 12/20/2009  . ABNORMAL CV (STRESS) TEST 12/20/2009    Past Surgical History:  Procedure Laterality Date  . ABDOMINAL HYSTERECTOMY     with panniculctomy  . AV FISTULA PLACEMENT  09/02/2012   Procedure: ARTERIOVENOUS (AV) FISTULA CREATION;  Surgeon: Elam Dutch, MD;  Location: [facility] OR;  Service: Vascular;  Laterality: Left;  Creation of Left Radial-Cephalic Fistula   . BREAST SURGERY     Biopsy right  breast  . COLONOSCOPY W/ BIOPSIES AND POLYPECTOMY    . DILATION AND CURETTAGE OF UTERUS    . KNEE ARTHROSCOPY Right   . REVISON OF ARTERIOVENOUS FISTULA Left 04/27/2014   Procedure: REVISON OF LEFT RADIAL-CEPHALIC ARTERIOVENOUS FISTULA;  Surgeon: Mal Misty, MD;  Location: [facility];  Service: Vascular;  Laterality: Left;  . TOTAL HIP ARTHROPLASTY Right 09/15/2018  . TOTAL HIP ARTHROPLASTY Right 09/15/2018   Procedure: RIGHT TOTAL HIP ARTHROPLASTY ANTERIOR APPROACH;  Surgeon: Leandrew Koyanagi, MD;  Location: [facility];  Service: Orthopedics;   Laterality: Right;  . UVULOPLASTY       OB History   None      Home Medications    Prior to Admission medications   Medication Sig Start Date End Date Taking? Authorizing Provider  aspirin EC 81 MG tablet Take 1 tablet (81 mg total) by mouth 2 (two) times daily. 09/15/18   Leandrew Koyanagi, MD  B Complex-C-Folic Acid (DIALYVITE TABLET) TABS Take 1 tablet by mouth at bedtime.  06/03/18   [provider]  ethyl chloride spray Apply 1 application topically See admin instructions. For dialysis 06/25/18   [provider]  gabapentin (NEURONTIN) 100 MG capsule Take 100 mg by mouth at bedtime. 05/14/17   [provider]  methocarbamol (ROBAXIN) 500 MG tablet Take 1 tablet (500 mg total) by mouth every 6 (six) hours as needed for muscle spasms. 09/15/18   Leandrew Koyanagi, MD  ondansetron (ZOFRAN) 4 MG tablet Take 1-2 tablets (4-8 mg total) by mouth every 8 (eight) hours as needed for nausea or vomiting. 09/15/18   Leandrew Koyanagi, MD  oxyCODONE (OXY IR/ROXICODONE) 5 MG immediate release tablet Take 1-3 tablets (5-15 mg total) by mouth every 4 (four) hours as needed. 09/15/18   Leandrew Koyanagi, MD  promethazine (PHENERGAN) 25 MG tablet Take 1 tablet (25 mg total) by mouth every 6 (six) hours as needed for nausea. 09/15/18   Leandrew Koyanagi, MD  senna-docusate (SENOKOT S) 8.6-50 MG tablet Take 1 tablet by mouth at bedtime as needed. 09/15/18   Leandrew Koyanagi, MD  sucroferric oxyhydroxide (VELPHORO) 500 MG chewable tablet Chew 2 capsules by mouth See admin instructions. 2 each three times daily with meals, and 2 for 1 daily snack    [provider]  sulfamethoxazole-trimethoprim (BACTRIM) 400-80 MG tablet Take 1 tablet by mouth at bedtime. 09/16/18   Leandrew Koyanagi, MD    Family History Family History  Problem Relation Age of Onset  . Lung cancer Father        died @ 86  . Hypertension Mother        alive @ 52  . Diabetes Brother   . Kidney disease Brother     Social  History Social History   Tobacco Use  . Smoking status: Never Smoker  . Smokeless tobacco: Never Used  Substance Use Topics  . Alcohol use: No    Alcohol/week: 0.0 standard drinks  . Drug use: No     Allergies   Iodine; Shellfish allergy; and Norvasc [amlodipine]   Review of Systems Review of Systems   Physical Exam Updated Vital Signs BP (!) 75/60 (BP Location: Right Arm)   Pulse (!) 112   Temp (!) 97.5 F (36.4 C) (Oral)   Resp (!) 24   Ht 5\' 5"  (1.651 m)   Wt 102.1 kg   SpO2 98%   BMI 37.44 kg/m   Physical Exam  Constitutional: She is  oriented to person, place, and time. She appears well-developed and well-nourished. She appears distressed due to pain.  HENT:  Head: Normocephalic and atraumatic.  Eyes: EOM are normal.  Cardiovascular: Tachycardia present.  Pulmonary/Chest: Effort normal. Tachypnea noted. No respiratory distress. She has no wheezes. She has no rales.  Abdominal: Soft. Bowel sounds are normal. There is no tenderness.  Musculoskeletal:       Right lower leg: She exhibits no tenderness and no edema.       Left lower leg: She exhibits no tenderness and no edema.  Neurological: She is alert and oriented to person, place, and time.  Psychiatric: Her mood appears anxious.   ED Treatments / Results  Labs (all labs ordered are listed, but only abnormal results are displayed) Labs Reviewed  BASIC METABOLIC PANEL  CBC  I-STAT TROPONIN, ED    EKG None  Radiology Xr Hip Unilat W Or W/o Pelvis 2-3 Views Left  Result Date: 11/03/2018 Severe degenerative joint disease of her left hip secondary to avascular necrosis  Xr Hip Unilat W Or W/o Pelvis 2-3 Views Right  Result Date: 11/03/2018 Stable appearing right total hip replacement without complication.   Procedures Procedures (including critical care time)  Medications Ordered in ED Medications - No data to display   Initial Impression / Assessment and Plan / ED Course  I have reviewed  the triage vital signs and the nursing notes.  Pertinent labs & imaging results that were available during my care of the patient were reviewed by me and considered in my medical decision making (see chart for details).    62 year old female with history of paroxysmal A. fib not on anticoagulation, presented with chest pain, dizziness and diaphoresis.   A. fib with RVR with low BP on arrival. Can not perform cardioversion due to risk of stroke as this in not a new onset A fib and she has not been on anticoagulation. Also we do not use betablocker due to low BP. She reports having low BP around 90/40.  Curb sided cardiology and they agreed to rate control with Cardizem.    -Plan with be rate control with Cardizem, Hep drip,  Admission, Echo and decision about cardioversion  I start Trop--> negative. Electrolyte normal. CBC-->Nl Low risk on Wells criteria for PE but not PERC negative and can not rull out PE considering Hx of hip surgery 6 weeks ago. Can be underlying cause of this A fib episode.  -NS 250 mg bolus -Cardizem load and then continue the drip -Hep drip per pharmacy -bilateral lower extremities ultrasound -Cardiac monitoring -EKG  Update: LE US showed acute deep vein thrombosis involving the right common femoral vein, right femoral vein, right popliteal vein, and right posterior tibial vein. -Increase Hep dose per pharmacy -CT angio chest for PE (denies iodine allergic, but will give benadryl and Solu-cortef prior to imaging per radiology) Critical care consulted. Recommended to talk to cardiology.  Update: Her chest pain improved. Return to normal Sinus by its own.  -Consult hospitalist for admission to step down.    Final Clinical Impressions(s) / ED Diagnoses   Final diagnoses:  None    ED Discharge Orders    None       Dewayne Hatch, MD 11/03/18 2346    Quintella Reichert, MD 11/05/18 [phone]

## 2018-11-03 NOTE — Progress Notes (Signed)
Called for patient in a-fib with RVR and DVTs and no PE confirming study, concern is that PE resulted in RVR.  Patient had a HR of 160 and SBP of 90s on a patient that ESRD and chronically hypotensive.  Cardiology recommended no cardioversion since a-fib is chronic and patient has not been on anti-coagulation.  Patient was started on Cardizem that was ineffective in controlling heart rate and resulted in hypotension to SBP of 88 after treatment with 250 ml of NS.  The alterative I would use would be amiodarone or if unstable then cardiovert but since cardiology recommended no cardioversion (amiodarone is chemical cardioversion) I am not sure what else can be offered without the involvement of cardiology.  Asked EDP to call cardiology for further advise before any decision to be made about how to approach this difficult case and call us back.    PCCM will be on standby pending a plan from cardiology.  Rush Farmer, M.D. [facility] Pulmonary/Critical Care Medicine. Pager: [phone]. After hours pager: [phone].

## 2018-11-03 NOTE — ED Notes (Signed)
Pt MWF dialysis pt. Last dialysis yesterday.

## 2018-11-04 ENCOUNTER — Other Ambulatory Visit (HOSPITAL_COMMUNITY): Payer: Self-pay

## 2018-11-04 ENCOUNTER — Observation Stay (HOSPITAL_COMMUNITY): Payer: BLUE CROSS/BLUE SHIELD

## 2018-11-04 DIAGNOSIS — E1122 Type 2 diabetes mellitus with diabetic chronic kidney disease: Secondary | ICD-10-CM | POA: Diagnosis not present

## 2018-11-04 DIAGNOSIS — R079 Chest pain, unspecified: Secondary | ICD-10-CM | POA: Diagnosis not present

## 2018-11-04 DIAGNOSIS — I120 Hypertensive chronic kidney disease with stage 5 chronic kidney disease or end stage renal disease: Secondary | ICD-10-CM | POA: Diagnosis not present

## 2018-11-04 DIAGNOSIS — N186 End stage renal disease: Secondary | ICD-10-CM

## 2018-11-04 DIAGNOSIS — I959 Hypotension, unspecified: Secondary | ICD-10-CM | POA: Diagnosis present

## 2018-11-04 DIAGNOSIS — Z992 Dependence on renal dialysis: Secondary | ICD-10-CM | POA: Diagnosis not present

## 2018-11-04 DIAGNOSIS — I4811 Longstanding persistent atrial fibrillation: Secondary | ICD-10-CM | POA: Diagnosis not present

## 2018-11-04 DIAGNOSIS — I82411 Acute embolism and thrombosis of right femoral vein: Secondary | ICD-10-CM | POA: Diagnosis not present

## 2018-11-04 DIAGNOSIS — I480 Paroxysmal atrial fibrillation: Secondary | ICD-10-CM | POA: Diagnosis not present

## 2018-11-04 DIAGNOSIS — E1169 Type 2 diabetes mellitus with other specified complication: Secondary | ICD-10-CM | POA: Diagnosis not present

## 2018-11-04 LAB — CBC
HCT: 36.4 % (ref 36.0–46.0)
Hemoglobin: 11 g/dL — ABNORMAL LOW (ref 12.0–15.0)
MCH: 30.5 pg (ref 26.0–34.0)
MCHC: 30.2 g/dL (ref 30.0–36.0)
MCV: 100.8 fL — ABNORMAL HIGH (ref 80.0–100.0)
Platelets: 166 10*3/uL (ref 150–400)
RBC: 3.61 MIL/uL — ABNORMAL LOW (ref 3.87–5.11)
RDW: 15.5 % (ref 11.5–15.5)
WBC: 7.4 10*3/uL (ref 4.0–10.5)
nRBC: 0 % (ref 0.0–0.2)

## 2018-11-04 LAB — RENAL FUNCTION PANEL
Albumin: 3.2 g/dL — ABNORMAL LOW (ref 3.5–5.0)
Anion gap: 18 — ABNORMAL HIGH (ref 5–15)
BUN: 48 mg/dL — ABNORMAL HIGH (ref 8–23)
CO2: 21 mmol/L — ABNORMAL LOW (ref 22–32)
Calcium: 8.5 mg/dL — ABNORMAL LOW (ref 8.9–10.3)
Chloride: 94 mmol/L — ABNORMAL LOW (ref 98–111)
Creatinine, Ser: 9.22 mg/dL — ABNORMAL HIGH (ref 0.44–1.00)
GFR calc Af Amer: 5 mL/min — ABNORMAL LOW (ref >60)
GFR calc non Af Amer: 4 mL/min — ABNORMAL LOW (ref >60)
Glucose, Bld: 126 mg/dL — ABNORMAL HIGH (ref 70–99)
Phosphorus: 6.7 mg/dL — ABNORMAL HIGH (ref 2.5–4.6)
Potassium: 5.2 mmol/L — ABNORMAL HIGH (ref 3.5–5.1)
Sodium: 133 mmol/L — ABNORMAL LOW (ref 135–145)

## 2018-11-04 LAB — HEPARIN LEVEL (UNFRACTIONATED)
Heparin Unfractionated: 1.24 IU/mL — ABNORMAL HIGH (ref 0.30–0.70)
Heparin Unfractionated: 0.52 IU/mL (ref 0.30–0.70)
Heparin Unfractionated: 0.24 IU/mL — ABNORMAL LOW (ref 0.30–0.70)
Heparin Unfractionated: 0.54 IU/mL (ref 0.30–0.70)

## 2018-11-04 LAB — TROPONIN I
Troponin I: 0.09 ng/mL (ref <0.03)
Troponin I: 0.08 ng/mL (ref <0.03)

## 2018-11-04 LAB — MRSA PCR SCREENING: MRSA by PCR: NEGATIVE

## 2018-11-04 LAB — MAGNESIUM: Magnesium: 2.1 mg/dL (ref 1.7–2.4)

## 2018-11-04 MED ORDER — IOPAMIDOL (ISOVUE-370) INJECTION 76%
100.0000 mL | Freq: Once | INTRAVENOUS | Status: AC | PRN
  Administered 2018-11-04: 100 mL via INTRAVENOUS

## 2018-11-04 MED ORDER — CHLORHEXIDINE GLUCONATE CLOTH 2 % EX PADS
6.0000 | MEDICATED_PAD | Freq: Every day | CUTANEOUS | Status: DC
  Administered 2018-11-05 – 2018-11-08 (×4): 6 via TOPICAL

## 2018-11-04 MED ORDER — WARFARIN - PHARMACIST DOSING INPATIENT
Freq: Every day | Status: DC
  Administered 2018-11-04 – 2018-11-08 (×4)

## 2018-11-04 MED ORDER — WARFARIN SODIUM 7.5 MG PO TABS
7.5000 mg | ORAL_TABLET | Freq: Once | ORAL | Status: AC
  Administered 2018-11-04: 7.5 mg via ORAL
  Filled 2018-11-04: qty 1

## 2018-11-04 NOTE — Progress Notes (Signed)
PROGRESS NOTE    CAMRIN LAPRE  WPY:[id number] DOB: August 12, 1956 DOA: 11/03/2018 PCP: Kelton Pillar, MD  Brief Narrative: 62 y.o. female with medical history significant of ESRD on HD, PAF, HTN, HLD, DM type 2, and hypotension; who presents with acute onset chest pain.  She had undergone an elective right hip replacement by Dr. Erlinda Hong on 10/15, and was placed on 81 mg of aspirin following the procedure.  Patient reports taking this medication as advised and had been doing well.  She had gone for a follow-up appointment today, and to also discuss left hip replacement.  During the appointment she reports having acute onset of substernal burning pain with chest tightness making it difficult for her to breathe.  She initially thought symptoms may be related to the reflux, but symptoms did not improve.  She was advised to go to the emergency department. She had shorter episodes once or twice before last week or so.  Denies having any leg swelling, calf pain, loss of consciousness, previous blood clots, fever, chills, or diaphoresis.  Other associated symptoms include some left hip pain and palpitation.  Patient states that she was previously on Coumadin for many years, but had an episode in February 2018 where she had prolonged epistaxis due to INR being elevated to 3.8.  Thereafter, patient reports that her primary care provider took her off the Coumadin approximately a year ago.  She denies any other issues with bleeding.  Last episode of atrial fibrillation was over year ago.  She last had a full hemodialysis session on Monday and is due to have a hemodialysis tomorrow.  For the last 6 to 8 months she states that she has been having issues with low blood pressures normally running in the 80s over 40s.  ED Course: Upon admission into the emergency department patient was noted to be patient was noted to be in A. fib with RVR with heart rates into the 160s with blood pressures as low as 66/49.  Due to patient  previously not being on anticoagulation cardioversion was contraindicated.  Patient received 200 mg of hydrocortisone, and 250 mL bolus of normal saline IV fluids for blood pressures.  Initial troponin 0.  Patient had been started on Cardizem drip.  Vascular Doppler ultrasound of lower extremities revealed acute DVT of the right common femoral, popliteal, posterior tibial vein.  Patient was started on heparin drip.  Critical care was consulted as well as cardiology due to patient's symptoms.  Patient did return to sinus rhythm.  CT angiogram of the chest was pending due to reported iodine allergy..  Patient was pretreated with Benadryl.  TRH called to admit.  Blood pressure still noted to be low but mildly improved last noted to be 93/54.  Patient reported having one episode of nausea in the ED, but currently reports being chest pain-free.   Assessment & Plan:   Principal Problem:   Chest pain Active Problems:   Paroxysmal atrial fibrillation (HCC)   DM (diabetes mellitus) ([place])   DVT (deep venous thrombosis) (HCC)   Hypotension   ESRD on hemodialysis ([place])   #1 status post A. fib with RVR-currently rate controlled.  Will start anticoagulation with Coumadin.  Consult pharmacy.  Follow-up echo.  Her blood pressure is soft 83/48 and 101/59.  #2 end-stage renal disease on dialysis   #3 right lower extremity acute DVT continue anticoagulation.  Chest CT shows no evidence of PE but shows coronary calcifications.      Nutrition Interventions:  Estimated body mass index is 37.93 kg/m as calculated from the following:   Height as of this encounter: 5\' 5"  (1.651 m).   Weight as of this encounter: 103.4 kg.  DVT prophylaxis: Heparin Code Status: Full code Family Communication: None Disposition Plan: Patient currently on dialysis getting echocardiogram today.  DC when cardiology okays.  Patient will need anticoagulation with Coumadin started for acute right lower extremity DVT currently  on heparin.   Consultants:  Cardiology Procedures: None Antimicrobials none  Subjective: Resting in bed denies any chest pain   Objective: Vitals:   11/04/18 1404 11/04/18 1407 11/04/18 1430 11/04/18 1500  BP: 102/62 (!) 102/55 (!) 101/59 (!) 83/48  Pulse: 76 76 72 76  Resp:      Temp:      TempSrc:      SpO2:      Weight:      Height:        Intake/Output Summary (Last 24 hours) at 11/04/2018 1546 Last data filed at 11/04/2018 0306 Gross per 24 hour  Intake 524.33 ml  Output -  Net 524.33 ml   Filed Weights   11/03/18 1515 11/04/18 1400  Weight: 102.1 kg 103.4 kg    Examination:  General exam: Appears calm and comfortable  Respiratory system: Clear to auscultation. Respiratory effort normal. Cardiovascular system: S1 & S2 heard, RRR. No JVD, murmurs, rubs, gallops or clicks. No pedal edema. Gastrointestinal system: Abdomen is nondistended, soft and nontender. No organomegaly or masses felt. Normal bowel sounds heard. Central nervous system: Alert and oriented. No focal neurological deficits. Extremities: Symmetric 5 x 5 power. Skin: No rashes, lesions or ulcers Psychiatry: Judgement and insight appear normal. Mood & affect appropriate.     Data Reviewed: I have personally reviewed following labs and imaging studies  CBC: Recent Labs  Lab 11/03/18 1529 11/04/18 0216  WBC 9.2 7.4  HGB 12.2 11.0*  HCT 39.3 36.4  MCV 101.6* 100.8*  PLT 197 295   Basic Metabolic Panel: Recent Labs  Lab 11/03/18 1529 11/04/18 0216 11/04/18 0827  NA 137 133*  --   K 4.1 5.2*  --   CL 94* 94*  --   CO2 25 21*  --   GLUCOSE 93 126*  --   BUN 39* 48*  --   CREATININE 8.81* 9.22*  --   CALCIUM 9.1 8.5*  --   MG  --   --  2.1  PHOS  --  6.7*  --    GFR: Estimated Creatinine Clearance: 7.6 mL/min (A) (by C-G formula based on SCr of 9.22 mg/dL (H)). Liver Function Tests: Recent Labs  Lab 11/04/18 0216  ALBUMIN 3.2*   No results for input(s): LIPASE, AMYLASE in  the last 168 hours. No results for input(s): AMMONIA in the last 168 hours. Coagulation Profile: No results for input(s): INR, PROTIME in the last 168 hours. Cardiac Enzymes: Recent Labs  Lab 11/03/18 2241 11/04/18 0216 11/04/18 0827  TROPONINI 0.08* 0.09* 0.08*   BNP (last 3 results) No results for input(s): PROBNP in the last 8760 hours. HbA1C: No results for input(s): HGBA1C in the last 72 hours. CBG: No results for input(s): GLUCAP in the last 168 hours. Lipid Profile: No results for input(s): CHOL, HDL, LDLCALC, TRIG, CHOLHDL, LDLDIRECT in the last 72 hours. Thyroid Function Tests: Recent Labs    11/03/18 2241  TSH 0.497   Anemia Panel: No results for input(s): VITAMINB12, FOLATE, FERRITIN, TIBC, IRON, RETICCTPCT in the last 72 hours. Sepsis Labs:  No results for input(s): PROCALCITON, LATICACIDVEN in the last 168 hours.  No results found for this or any previous visit (from the past 240 hour(s)).       Radiology Studies: Ct Angio Chest Pe W/cm &/or Wo Cm  Result Date: 11/04/2018 CLINICAL DATA:  62 year old female with chest pain. Concern for pulmonary embolism. EXAM: CT ANGIOGRAPHY CHEST WITH CONTRAST TECHNIQUE: Multidetector CT imaging of the chest was performed using the standard protocol during bolus administration of intravenous contrast. Multiplanar CT image reconstructions and MIPs were obtained to evaluate the vascular anatomy. CONTRAST:  148mL ISOVUE-370 IOPAMIDOL (ISOVUE-370) INJECTION 76% COMPARISON:  Chest radiograph dated 11/03/2018 FINDINGS: Cardiovascular: Top-normal cardiac size. No pericardial effusion. There is multi vessel coronary vascular calcification. Moderate atherosclerotic calcification of the thoracic aorta. No aneurysmal dilatation or evidence of dissection. Evaluation of the pulmonary arteries is very limited due to suboptimal opacification and timing of the contrast and poor visualization of the peripheral branches. No large or central  pulmonary artery embolus identified. Mediastinum/Nodes: There is no hilar or mediastinal adenopathy. The esophagus and the thyroid gland are grossly unremarkable. No mediastinal fluid collection. Lungs/Pleura: There are minimal bibasilar atelectatic changes. No focal consolidation, pleural effusion, or pneumothorax. A 6 mm calcified left upper lobe granuloma. The central airways are patent. Upper Abdomen: Left adrenal thickening/hyperplasia. Moderate right renal atrophy. Low attenuating lesions arising from the right kidney measure up to 6 cm in the superior pole are not characterized but likely represent cysts. Musculoskeletal: Degenerative changes of the spine. Multilevel anterior bridging osteophyte most consistent with DISH. No acute osseous pathology. Review of the MIP images confirms the above findings. IMPRESSION: 1. No acute intrathoracic pathology. No CT evidence of central pulmonary artery embolus. 2. Coronary vascular calcification and Aortic Atherosclerosis (ICD10-I70.0). Electronically Signed   By: Anner Crete M.D.   On: 11/04/2018 00:32   Dg Chest Port 1 View  Result Date: 11/03/2018 CLINICAL DATA:  Acute chest pain today. EXAM: PORTABLE CHEST 1 VIEW COMPARISON:  04/27/2014 and prior exams FINDINGS: UPPER limits normal heart size and pulmonary vascular congestion noted. Mild bibasilar atelectasis identified. There is no evidence of focal airspace disease, pulmonary edema, suspicious pulmonary nodule/mass, pleural effusion, or pneumothorax. No acute bony abnormalities are identified. IMPRESSION: UPPER limits normal heart size with pulmonary vascular congestion. Electronically Signed   By: Margarette Canada M.D.   On: 11/03/2018 16:40   Xr Hip Unilat W Or W/o Pelvis 2-3 Views Left  Result Date: 11/03/2018 Severe degenerative joint disease of her left hip secondary to avascular necrosis  Xr Hip Unilat W Or W/o Pelvis 2-3 Views Right  Result Date: 11/03/2018 Stable appearing right total hip  replacement without complication.  Vas [place] Lower Extremity Venous (dvt) (only [facility])  Result Date: 11/03/2018  Lower Venous Study Indications: Tachycardia.  Performing Technologist: Abram Sander RVS  Examination Guidelines: A complete evaluation includes B-mode imaging, spectral Doppler, color Doppler, and power Doppler as needed of all accessible portions of each vessel. Bilateral testing is considered an integral part of a complete examination. Limited examinations for reoccurring indications may be performed as noted.  Right Venous Findings: +---------+---------------+---------+-----------+----------+--------------+          CompressibilityPhasicitySpontaneityPropertiesSummary        +---------+---------------+---------+-----------+----------+--------------+ CFV      None           No       No                                  +---------+---------------+---------+-----------+----------+--------------+  SFJ      Full                                                        +---------+---------------+---------+-----------+----------+--------------+ FV Prox  None                                                        +---------+---------------+---------+-----------+----------+--------------+ FV Mid   None                                                        +---------+---------------+---------+-----------+----------+--------------+ FV DistalNone                                                        +---------+---------------+---------+-----------+----------+--------------+ PFV      Full                                                        +---------+---------------+---------+-----------+----------+--------------+ POP      None           No       No                                  +---------+---------------+---------+-----------+----------+--------------+ PTV      None                                                         +---------+---------------+---------+-----------+----------+--------------+ PERO                                                  not visualized +---------+---------------+---------+-----------+----------+--------------+  Left Venous Findings: +---------+---------------+---------+-----------+----------+-------+          CompressibilityPhasicitySpontaneityPropertiesSummary +---------+---------------+---------+-----------+----------+-------+ CFV      Full           Yes      Yes                          +---------+---------------+---------+-----------+----------+-------+ SFJ      Full                                                 +---------+---------------+---------+-----------+----------+-------+  FV Prox  Full                                                 +---------+---------------+---------+-----------+----------+-------+ FV Mid   Full                                                 +---------+---------------+---------+-----------+----------+-------+ FV DistalFull                                                 +---------+---------------+---------+-----------+----------+-------+ PFV      Full                                                 +---------+---------------+---------+-----------+----------+-------+ POP      Full           Yes      Yes                          +---------+---------------+---------+-----------+----------+-------+ PTV      Full                                                 +---------+---------------+---------+-----------+----------+-------+ PERO     Full                                                 +---------+---------------+---------+-----------+----------+-------+    Summary: Right: Findings consistent with acute deep vein thrombosis involving the right common femoral vein, right femoral vein, right popliteal vein, and right posterior tibial vein. Left: There is no evidence of deep vein thrombosis in the  lower extremity. No cystic structure found in the popliteal fossa.  *See table(s) above for measurements and observations.    Preliminary         Scheduled Meds: . Chlorhexidine Gluconate Cloth  6 each Topical Q0600  . gabapentin  100 mg Oral QHS  . multivitamin  1 tablet Oral QHS  . sodium chloride flush  3 mL Intravenous Q12H  . sucroferric oxyhydroxide  1,000 mg Oral TID WC   Continuous Infusions: . diltiazem (CARDIZEM) infusion Stopped (11/04/18 0207)  . heparin 1,250 Units/hr (11/04/18 1158)     LOS: 0 days     Georgette Shell, MD [facility]  If 7PM-7AM, please contact night-coverage [email or web address] Password [email or web address] 11/04/2018, 3:46 PM

## 2018-11-04 NOTE — Consult Note (Signed)
[facility] Renal Consultation Note    Indication for Consultation:  Management of ESRD/hemodialysis, anemia, hypertension/volume, and secondary hyperparathyroidism. PCP: Dr. Laurann Montana  HPI: Brenda Arnold is a 62 y.o. female with ESRD, Type 2 DM, HTN, obesity, sleep apnea, Hx paroxysmal A-fib (off warfarin since late 2018), and recent R hip replacement (09/15/18) who was admitted with acute R DVT.   Pt actually presented to the ED with intermittent chest pains for the past several days. At first she attributed it to indigestion, but pain worsened while she was at her orthopedic office yesterday afternoon and it was advised to come to the ED for evaluation. No presenting dyspnea, N/V, fever, leg pains, or edema.  In ED, found to be hypotensive and tachycardic. EKG showed A-fib RVR. She was started on cardizem drip for rate control. Labs showed K 4.1, Ca 9.1, Trop 0.08, Mg 2.1, Hgb 12.2. She underwent BLE ultrasound which showed acute R DVT involving R common femoral, R femoral, R popliteal, and R posterior tibial veins. CT angio of the chest was negative for PE. She was started on heparin drip and admitted.  Today, she feels ok. No further CP, dyspnea, edema, leg pains, N/V/D. She has L hip pains for which she was planning L hip replacement. No R hip pains since right after surgery on 09/15/18.Tells me her last cardiac stress test was negative in 2018.   Dialyzes on MWF schedule at [facility]. Last HD was Monday 12/2. She is typically very compliant with her treatments. Uses L AVF without recent issues.   Past Medical History:  Diagnosis Date  . Anemia   . Arthritis   . Asthma   . Complication of anesthesia    difficulty with getting oxygen saturation up  . Diabetes mellitus   . Dyslipidemia   . Epistaxis 11/05/2012  . ESRD (end stage renal disease) on dialysis [facility])    "MWF; [facility] R[facility]" (09/15/2018)  . History of nuclear stress test    Myoview 5/18: EF 68, no infarct or  ischemia, low risk  . HTN (hypertension)   . Hyperlipidemia   . Hyperparathyroidism due to renal insufficiency ([place])   . Obesity    s/p panniculectomy  . Paroxysmal A-fib (HCC)    a. chronic coumadin;  b. 12/2009 Echo: EF 60-65%, Gr 1 DD.  Marland Kitchen PCOS (polycystic ovarian syndrome)   . Pneumonia   . Seasonal allergies   . Sleep apnea    a. not using CPAP, last study  >8 yrs  . Vitamin D deficiency    Past Surgical History:  Procedure Laterality Date  . ABDOMINAL HYSTERECTOMY     with panniculctomy  . AV FISTULA PLACEMENT  09/02/2012   Procedure: ARTERIOVENOUS (AV) FISTULA CREATION;  Surgeon: Elam Dutch, MD;  Location: [facility] OR;  Service: Vascular;  Laterality: Left;  Creation of Left Radial-Cephalic Fistula   . BREAST SURGERY     Biopsy right breast  . COLONOSCOPY W/ BIOPSIES AND POLYPECTOMY    . DILATION AND CURETTAGE OF UTERUS    . KNEE ARTHROSCOPY Right   . REVISON OF ARTERIOVENOUS FISTULA Left 04/27/2014   Procedure: REVISON OF LEFT RADIAL-CEPHALIC ARTERIOVENOUS FISTULA;  Surgeon: Mal Misty, MD;  Location: [place];  Service: Vascular;  Laterality: Left;  . TOTAL HIP ARTHROPLASTY Right 09/15/2018  . TOTAL HIP ARTHROPLASTY Right 09/15/2018   Procedure: RIGHT TOTAL HIP ARTHROPLASTY ANTERIOR APPROACH;  Surgeon: Leandrew Koyanagi, MD;  Location: [place];  Service: Orthopedics;  Laterality: Right;  .  UVULOPLASTY     Family History  Problem Relation Age of Onset  . Lung cancer Father        died @ 16  . Hypertension Mother        alive @ 40  . Diabetes Brother   . Kidney disease Brother    Social History:  reports that she has never smoked. She has never used smokeless tobacco. She reports that she does not drink alcohol or use drugs.  ROS: As per HPI otherwise negative.  Physical Exam: Vitals:   11/04/18 0900 11/04/18 0930 11/04/18 1000 11/04/18 1030  BP: (!) 92/52 (!) 100/54 (!) 85/44 (!) 107/45  Pulse: 63  68 77  Resp: 11 12 14 16   Temp:      TempSrc:      SpO2: 97%  97%  99%  Weight:      Height:         General: Well developed, well nourished, in no acute distress. Head: Normocephalic, atraumatic, sclera non-icteric, mucus membranes are moist. Neck: Supple without lymphadenopathy/masses. JVD not elevated. Lungs: Clear bilaterally to auscultation without wheezes, rales, or rhonchi. Breathing is unlabored. Heart: RRR with normal S1, S2. No murmurs, rubs, or gallops appreciated. Abdomen: Soft, non-tender, non-distended with normoactive bowel sounds. No rebound/guarding. Musculoskeletal:  Strength and tone appear normal for age. Lower extremities: No edema or ischemic changes, no open wounds. Neuro: Alert and oriented X 3. Moves all extremities spontaneously. Psych:  Responds to questions appropriately with a normal affect. Dialysis Access: L forearm AVF + thrill  Allergies  Allergen Reactions  . Iodine Shortness Of Breath  . Shellfish Allergy Shortness Of Breath  . Norvasc [Amlodipine] Swelling    SWELLING REACTION UNSPECIFIED    Prior to Admission medications   Medication Sig Start Date End Date Taking? Authorizing Provider  aspirin EC 81 MG tablet Take 1 tablet (81 mg total) by mouth 2 (two) times daily. 09/15/18  Yes Leandrew Koyanagi, MD  B Complex-C-Folic Acid (DIALYVITE TABLET) TABS Take 1 tablet by mouth at bedtime.  06/03/18  Yes [provider]  ethyl chloride spray Apply 1 application topically See admin instructions. For dialysis 06/25/18  Yes [provider]  gabapentin (NEURONTIN) 100 MG capsule Take 100 mg by mouth at bedtime. 05/14/17  Yes [provider]  methocarbamol (ROBAXIN) 500 MG tablet Take 1 tablet (500 mg total) by mouth every 6 (six) hours as needed for muscle spasms. 09/15/18  Yes Leandrew Koyanagi, MD  ondansetron (ZOFRAN) 4 MG tablet Take 1-2 tablets (4-8 mg total) by mouth every 8 (eight) hours as needed for nausea or vomiting. 09/15/18  Yes Leandrew Koyanagi, MD  oxyCODONE (OXY IR/ROXICODONE) 5 MG immediate  release tablet Take 1-3 tablets (5-15 mg total) by mouth every 4 (four) hours as needed. 09/15/18  Yes Leandrew Koyanagi, MD  promethazine (PHENERGAN) 25 MG tablet Take 1 tablet (25 mg total) by mouth every 6 (six) hours as needed for nausea. 09/15/18  Yes Leandrew Koyanagi, MD  senna-docusate (SENOKOT S) 8.6-50 MG tablet Take 1 tablet by mouth at bedtime as needed. 09/15/18  Yes Leandrew Koyanagi, MD  sucroferric oxyhydroxide (VELPHORO) 500 MG chewable tablet Chew 2 capsules by mouth See admin instructions. 2 each three times daily with meals, and 2 for 1 daily snack   Yes [provider]  sulfamethoxazole-trimethoprim (BACTRIM) 400-80 MG tablet Take 1 tablet by mouth at bedtime. Patient not taking: Reported on 11/03/2018 09/16/18   Leandrew Koyanagi,  MD   Current Facility-Administered Medications  Medication Dose Route Frequency Provider Last Rate Last Dose  . acetaminophen (TYLENOL) tablet 650 mg  650 mg Oral Q6H PRN Norval Morton, MD       Or  . acetaminophen (TYLENOL) suppository 650 mg  650 mg Rectal Q6H PRN Smith, Rondell A, MD      . albuterol (PROVENTIL) (2.5 MG/3ML) 0.083% nebulizer solution 2.5 mg  2.5 mg Nebulization Q4H PRN Fuller Plan A, MD      . Chlorhexidine Gluconate Cloth 2 % PADS 6 each  6 each Topical Q0600 Loren Racer, PA-C      . diltiazem (CARDIZEM) 100 mg in dextrose 5% 178mL (1 mg/mL) infusion  5-15 mg/hr Intravenous Continuous Norval Morton, MD   Stopped at 11/04/18 0207  . gabapentin (NEURONTIN) capsule 100 mg  100 mg Oral QHS Smith, Rondell A, MD   100 mg at 11/03/18 2235  . heparin ADULT infusion 100 units/mL (25000 units/268mL sodium chloride 0.45%)  1,250 Units/hr Intravenous Continuous Harvel Quale, RPH 10.5 mL/hr at 11/04/18 0306 1,050 Units/hr at 11/04/18 0306  . multivitamin (RENA-VIT) tablet 1 tablet  1 tablet Oral QHS Fuller Plan A, MD   1 tablet at 11/03/18 2235  . ondansetron (ZOFRAN) tablet 4 mg  4 mg Oral Q6H PRN Fuller Plan A, MD        Or  . ondansetron (ZOFRAN) injection 4 mg  4 mg Intravenous Q6H PRN Smith, Rondell A, MD      . oxyCODONE (Oxy IR/ROXICODONE) immediate release tablet 5-15 mg  5-15 mg Oral Q4H PRN Smith, Rondell A, MD      . sodium chloride flush (NS) 0.9 % injection 3 mL  3 mL Intravenous Q12H Smith, Rondell A, MD   3 mL at 11/03/18 2238  . sucroferric oxyhydroxide (VELPHORO) chewable tablet 1,000 mg  1,000 mg Oral TID WC Smith, Rondell A, MD   1,000 mg at 11/04/18 1020  . sucroferric oxyhydroxide (VELPHORO) chewable tablet 1,000 mg  1,000 mg Oral Daily PRN Norval Morton, MD       Current Outpatient Medications  Medication Sig Dispense Refill  . aspirin EC 81 MG tablet Take 1 tablet (81 mg total) by mouth 2 (two) times daily. 84 tablet 0  . B Complex-C-Folic Acid (DIALYVITE TABLET) TABS Take 1 tablet by mouth at bedtime.   3  . ethyl chloride spray Apply 1 application topically See admin instructions. For dialysis  6  . gabapentin (NEURONTIN) 100 MG capsule Take 100 mg by mouth at bedtime.  3  . methocarbamol (ROBAXIN) 500 MG tablet Take 1 tablet (500 mg total) by mouth every 6 (six) hours as needed for muscle spasms. 30 tablet 2  . ondansetron (ZOFRAN) 4 MG tablet Take 1-2 tablets (4-8 mg total) by mouth every 8 (eight) hours as needed for nausea or vomiting. 40 tablet 0  . oxyCODONE (OXY IR/ROXICODONE) 5 MG immediate release tablet Take 1-3 tablets (5-15 mg total) by mouth every 4 (four) hours as needed. 30 tablet 0  . promethazine (PHENERGAN) 25 MG tablet Take 1 tablet (25 mg total) by mouth every 6 (six) hours as needed for nausea. 30 tablet 1  . senna-docusate (SENOKOT S) 8.6-50 MG tablet Take 1 tablet by mouth at bedtime as needed. 30 tablet 1  . sucroferric oxyhydroxide (VELPHORO) 500 MG chewable tablet Chew 2 capsules by mouth See admin instructions. 2 each three times daily with meals, and 2 for 1 daily snack    .  sulfamethoxazole-trimethoprim (BACTRIM) 400-80 MG tablet Take 1 tablet by mouth at  bedtime. (Patient not taking: Reported on 11/03/2018) 14 tablet 0   Labs: Basic Metabolic Panel: Recent Labs  Lab 11/03/18 1529 11/04/18 0216  NA 137 133*  K 4.1 5.2*  CL 94* 94*  CO2 25 21*  GLUCOSE 93 126*  BUN 39* 48*  CREATININE 8.81* 9.22*  CALCIUM 9.1 8.5*  PHOS  --  6.7*   Liver Function Tests: Recent Labs  Lab 11/04/18 0216  ALBUMIN 3.2*   CBC: Recent Labs  Lab 11/03/18 1529 11/04/18 0216  WBC 9.2 7.4  HGB 12.2 11.0*  HCT 39.3 36.4  MCV 101.6* 100.8*  PLT 197 166   Cardiac Enzymes: Recent Labs  Lab 11/03/18 2241 11/04/18 0216 11/04/18 0827  TROPONINI 0.08* 0.09* 0.08*   Studies/Results: Ct Angio Chest Pe W/cm &/or Wo Cm  Result Date: 11/04/2018 CLINICAL DATA:  62 year old female with chest pain. Concern for pulmonary embolism. EXAM: CT ANGIOGRAPHY CHEST WITH CONTRAST TECHNIQUE: Multidetector CT imaging of the chest was performed using the standard protocol during bolus administration of intravenous contrast. Multiplanar CT image reconstructions and MIPs were obtained to evaluate the vascular anatomy. CONTRAST:  169mL ISOVUE-370 IOPAMIDOL (ISOVUE-370) INJECTION 76% COMPARISON:  Chest radiograph dated 11/03/2018 FINDINGS: Cardiovascular: Top-normal cardiac size. No pericardial effusion. There is multi vessel coronary vascular calcification. Moderate atherosclerotic calcification of the thoracic aorta. No aneurysmal dilatation or evidence of dissection. Evaluation of the pulmonary arteries is very limited due to suboptimal opacification and timing of the contrast and poor visualization of the peripheral branches. No large or central pulmonary artery embolus identified. Mediastinum/Nodes: There is no hilar or mediastinal adenopathy. The esophagus and the thyroid gland are grossly unremarkable. No mediastinal fluid collection. Lungs/Pleura: There are minimal bibasilar atelectatic changes. No focal consolidation, pleural effusion, or pneumothorax. A 6 mm calcified left  upper lobe granuloma. The central airways are patent. Upper Abdomen: Left adrenal thickening/hyperplasia. Moderate right renal atrophy. Low attenuating lesions arising from the right kidney measure up to 6 cm in the superior pole are not characterized but likely represent cysts. Musculoskeletal: Degenerative changes of the spine. Multilevel anterior bridging osteophyte most consistent with DISH. No acute osseous pathology. Review of the MIP images confirms the above findings. IMPRESSION: 1. No acute intrathoracic pathology. No CT evidence of central pulmonary artery embolus. 2. Coronary vascular calcification and Aortic Atherosclerosis (ICD10-I70.0). Electronically Signed   By: Anner Crete M.D.   On: 11/04/2018 00:32   Dg Chest Port 1 View  Result Date: 11/03/2018 CLINICAL DATA:  Acute chest pain today. EXAM: PORTABLE CHEST 1 VIEW COMPARISON:  04/27/2014 and prior exams FINDINGS: UPPER limits normal heart size and pulmonary vascular congestion noted. Mild bibasilar atelectasis identified. There is no evidence of focal airspace disease, pulmonary edema, suspicious pulmonary nodule/mass, pleural effusion, or pneumothorax. No acute bony abnormalities are identified. IMPRESSION: UPPER limits normal heart size with pulmonary vascular congestion. Electronically Signed   By: Margarette Canada M.D.   On: 11/03/2018 16:40   Xr Hip Unilat W Or W/o Pelvis 2-3 Views Left  Result Date: 11/03/2018 Severe degenerative joint disease of her left hip secondary to avascular necrosis  Xr Hip Unilat W Or W/o Pelvis 2-3 Views Right  Result Date: 11/03/2018 Stable appearing right total hip replacement without complication.  Vas [place] Lower Extremity Venous (dvt) (only [facility])  Result Date: 11/03/2018  Lower Venous Study Indications: Tachycardia.  Performing Technologist: Abram Sander RVS  Examination Guidelines: A complete evaluation includes B-mode  imaging, spectral Doppler, color Doppler, and power Doppler as needed of all  accessible portions of each vessel. Bilateral testing is considered an integral part of a complete examination. Limited examinations for reoccurring indications may be performed as noted.  Right Venous Findings: +---------+---------------+---------+-----------+----------+--------------+          CompressibilityPhasicitySpontaneityPropertiesSummary        +---------+---------------+---------+-----------+----------+--------------+ CFV      None           No       No                                  +---------+---------------+---------+-----------+----------+--------------+ SFJ      Full                                                        +---------+---------------+---------+-----------+----------+--------------+ FV Prox  None                                                        +---------+---------------+---------+-----------+----------+--------------+ FV Mid   None                                                        +---------+---------------+---------+-----------+----------+--------------+ FV DistalNone                                                        +---------+---------------+---------+-----------+----------+--------------+ PFV      Full                                                        +---------+---------------+---------+-----------+----------+--------------+ POP      None           No       No                                  +---------+---------------+---------+-----------+----------+--------------+ PTV      None                                                        +---------+---------------+---------+-----------+----------+--------------+ PERO                                                  not visualized +---------+---------------+---------+-----------+----------+--------------+  Left Venous Findings: +---------+---------------+---------+-----------+----------+-------+           CompressibilityPhasicitySpontaneityPropertiesSummary +---------+---------------+---------+-----------+----------+-------+ CFV      Full           Yes      Yes                          +---------+---------------+---------+-----------+----------+-------+ SFJ      Full                                                 +---------+---------------+---------+-----------+----------+-------+ FV Prox  Full                                                 +---------+---------------+---------+-----------+----------+-------+ FV Mid   Full                                                 +---------+---------------+---------+-----------+----------+-------+ FV DistalFull                                                 +---------+---------------+---------+-----------+----------+-------+ PFV      Full                                                 +---------+---------------+---------+-----------+----------+-------+ POP      Full           Yes      Yes                          +---------+---------------+---------+-----------+----------+-------+ PTV      Full                                                 +---------+---------------+---------+-----------+----------+-------+ PERO     Full                                                 +---------+---------------+---------+-----------+----------+-------+    Summary: Right: Findings consistent with acute deep vein thrombosis involving the right common femoral vein, right femoral vein, right popliteal vein, and right posterior tibial vein. Left: There is no evidence of deep vein thrombosis in the lower extremity. No cystic structure found in the popliteal fossa.  *See table(s) above for measurements and observations.    Preliminary    Dialysis Orders:  MWF at [facility] 4hr, 450/800, 2K/2Ca, EDW 101.5kg, L AVF, heparin 8000 + 3000 mid-run bolus - Calcitriol 1.7mcg PO q HD - Mircera 48mcg IV q 2 weeks (last 11/26) - Parsabiv  12.5mg   IV q HD - Binders: Velphoro 2/meals.  Assessment/Plan: 1.  Acute R DVT: Hx recent surgery, no symptoms. CT angio negative. On heparin drip. 2.  Chest pain (now resolved): Back in NSR. Per primary as to whether needs further evaluation. 3.  A-fib RVR (on admit): Hx paroxysmal A-fib in past. Took warfarin for while, off since 2018.  4.  ESRD: Will continue HD per MWF schedule, for HD today. 5.  Hypertension/volume: BP low on admit, improved slightly now. No edema. Low UF goal today. 6.  Anemia: Hgb 11, not due for ESA yet 7.  Metabolic bone disease: Ca ok, Phos high - continue Velphoro as binder. 8.  Type 2 DM  Veneta Penton, PA-C 11/04/2018, 11:46 AM  [facility] Pager: [phone]

## 2018-11-04 NOTE — Progress Notes (Signed)
ANTICOAGULATION CONSULT NOTE - Follow Up Consult  Pharmacy Consult for heparin Indication: DVT and Afib  Labs: Recent Labs    11/03/18 1529 11/03/18 2241 11/04/18 0216 11/04/18 0350  HGB 12.2  --  11.0*  --   HCT 39.3  --  36.4  --   PLT 197  --  166  --   HEPARINUNFRC  --   --  1.24* 0.52  CREATININE 8.81*  --  9.22*  --   TROPONINI  --  0.08* 0.09*  --     Assessment/Plan:  62yo female therapeutic on heparin with initial dosing for Afib and DVT. Will continue gtt at current rate and confirm stable with additional level.   Wynona Neat, PharmD, BCPS  11/04/2018,5:14 AM

## 2018-11-04 NOTE — ED Notes (Signed)
Placed pt on hospital bed

## 2018-11-04 NOTE — Discharge Instructions (Signed)

## 2018-11-04 NOTE — Progress Notes (Addendum)
ANTICOAGULATION CONSULT NOTE  Pharmacy Consult for heparin Indication: atrial fibrillation / new bilateral DVTs  Patient Measurements: Height: 5\' 5"  (165.1 cm) Weight: 225 lb (102.1 kg) IBW/kg (Calculated) : 57 Heparin Dosing Weight: 80 kg   Vital Signs: BP: 107/45 (12/04 1030) Pulse Rate: 77 (12/04 1030)  Labs: Recent Labs    11/03/18 1529 11/03/18 2241 11/04/18 0216 11/04/18 0350 11/04/18 0827  HGB 12.2  --  11.0*  --   --   HCT 39.3  --  36.4  --   --   PLT 197  --  166  --   --   HEPARINUNFRC  --   --  1.24* 0.52 0.24*  CREATININE 8.81*  --  9.22*  --   --   TROPONINI  --  0.08* 0.09*  --  0.08*    Assessment: 83 YOF who presented with chest pain. She has a h/o of Afib not on anticoagulation. Currently on Afib with RVR. She was started on a heparin drip yesterday. This morning's heparin level was subtherapeutic. She is ESRD on HD, best oral agent would likely be warfarin.  Goal of Therapy:  Heparin level 0.3-0.7 units/ml Monitor platelets by anticoagulation protocol: Yes   Plan:  -Increase heparin to 1250 units/hr -Daily HL, CBC -Check next HL in 8 hours    Brenda Arnold 11/04/2018 11:53 AM

## 2018-11-04 NOTE — Consult Note (Addendum)
Cardiology Consultation:   Patient ID: Brenda Arnold MRN: [id number]; DOB: [birth year]  Admit date: 11/03/2018 Date of Consult: 11/04/2018  Primary Care Provider: Kelton Pillar, MD Primary Cardiologist: Fransico Him, MD  Primary Electrophysiologist:  None    Patient Profile:   Brenda Arnold is a 62 y.o. female with a hx of hypertension, paroxysmal atrial fibrillation, ESRD on HD, OSA on CPAP, type II DM, R hip replacement 09/15/2018 and obesity who is being seen today for the evaluation of atrial fibrillation with RVR at the request of Dr. Rodena Piety.  History of Present Illness:   Brenda Arnold is followed in our office for management of atrial fibrillation.  Brenda Arnold was last seen on 07/24/2018 by Fabian Sharp, PA.  Brenda Arnold has a history of Myoview secondary to chest pain in the setting of atrial fibrillation in 2011 that showed inferior lateral lateral ischemia but heart catheterization was deferred and medical therapy was recommended.  At her office appointment in 01/2017 Brenda Arnold complained of severe dyspnea on exertion.  A Myoview stress test was done on 04/17/2017 that showed no reversible ischemia.   At office visit in 07/2018 it was noted that the patient had been struggling with some mild hypotension with dialysis and had been taken off of diltiazem and losartan and was no longer taking any cardiac medications.  Brenda Arnold was also not on anticoagulation and was maintaining sinus rhythm.  Brenda Arnold presented to the emergency department yesterday afternoon with new onset of chest pain and dizziness and mild shortness of breath.  Brenda Arnold was found to be in A. fib with RVR with low blood pressure on arrival.  Brenda Arnold was given a 250 mL normal saline IV bolus and started on diltiazem drip.  Brenda Arnold subsequently converted to sinus rhythm, BP improved and her chest pain resolved.  Upon my assessment Brenda Arnold is very comfortable, pleasant and cheerful.  Brenda Arnold states that on Monday morning Brenda Arnold had some mild chest  discomfort that Brenda Arnold says felt like indigestion which resolved spontaneously a short time after.  Yesterday Brenda Arnold developed again chest discomfort that felt like indigestion.  Brenda Arnold went to her orthopedic surgery office visit and her chest discomfort worsened so the surgeon became concerned and advised her to go to the hospital.  As it progressed Brenda Arnold did develop some mild shortness of breath and lightheadedness that Brenda Arnold associates with the low blood pressure.  All of her symptoms began to resolve last evening after starting the IV diltiazem and the IV fluid bolus.  Brenda Arnold has had no discomfort or shortness of breath since that time and feels very well today.  Other than this episode Brenda Arnold denies any prior chest pain, peripheral edema or shortness of breath.   Brenda Arnold tells me that Brenda Arnold was taken off Coumadin at the end of either 2017 or 2018 after a significant nosebleed when her INR was elevated.  Brenda Arnold had previously been followed for her Coumadin at her primary care office.   Past Medical History:  Diagnosis Date  . Anemia   . Arthritis   . Asthma   . Complication of anesthesia    difficulty with getting oxygen saturation up  . Diabetes mellitus   . Dyslipidemia   . Epistaxis 11/05/2012  . ESRD (end stage renal disease) on dialysis [facility])    "MWF; [facility] R[facility]" (09/15/2018)  . History of nuclear stress test    Myoview 5/18: EF 68, no infarct or ischemia, low risk  . HTN (hypertension)   . Hyperlipidemia   .  Hyperparathyroidism due to renal insufficiency ([place])   . Obesity    s/p panniculectomy  . Paroxysmal A-fib (HCC)    a. chronic coumadin;  b. 12/2009 Echo: EF 60-65%, Gr 1 DD.  Marland Kitchen PCOS (polycystic ovarian syndrome)   . Pneumonia   . Seasonal allergies   . Sleep apnea    a. not using CPAP, last study  >8 yrs  . Vitamin D deficiency     Past Surgical History:  Procedure Laterality Date  . ABDOMINAL HYSTERECTOMY     with panniculctomy  . AV FISTULA PLACEMENT  09/02/2012   Procedure:  ARTERIOVENOUS (AV) FISTULA CREATION;  Surgeon: Elam Dutch, MD;  Location: [facility] OR;  Service: Vascular;  Laterality: Left;  Creation of Left Radial-Cephalic Fistula   . BREAST SURGERY     Biopsy right breast  . COLONOSCOPY W/ BIOPSIES AND POLYPECTOMY    . DILATION AND CURETTAGE OF UTERUS    . KNEE ARTHROSCOPY Right   . REVISON OF ARTERIOVENOUS FISTULA Left 04/27/2014   Procedure: REVISON OF LEFT RADIAL-CEPHALIC ARTERIOVENOUS FISTULA;  Surgeon: Mal Misty, MD;  Location: [place];  Service: Vascular;  Laterality: Left;  . TOTAL HIP ARTHROPLASTY Right 09/15/2018  . TOTAL HIP ARTHROPLASTY Right 09/15/2018   Procedure: RIGHT TOTAL HIP ARTHROPLASTY ANTERIOR APPROACH;  Surgeon: Leandrew Koyanagi, MD;  Location: [place];  Service: Orthopedics;  Laterality: Right;  . UVULOPLASTY       Home Medications:  Prior to Admission medications   Medication Sig Start Date End Date Taking? Authorizing Provider  aspirin EC 81 MG tablet Take 1 tablet (81 mg total) by mouth 2 (two) times daily. 09/15/18  Yes Leandrew Koyanagi, MD  B Complex-C-Folic Acid (DIALYVITE TABLET) TABS Take 1 tablet by mouth at bedtime.  06/03/18  Yes [provider]  ethyl chloride spray Apply 1 application topically See admin instructions. For dialysis 06/25/18  Yes [provider]  gabapentin (NEURONTIN) 100 MG capsule Take 100 mg by mouth at bedtime. 05/14/17  Yes [provider]  methocarbamol (ROBAXIN) 500 MG tablet Take 1 tablet (500 mg total) by mouth every 6 (six) hours as needed for muscle spasms. 09/15/18  Yes Leandrew Koyanagi, MD  ondansetron (ZOFRAN) 4 MG tablet Take 1-2 tablets (4-8 mg total) by mouth every 8 (eight) hours as needed for nausea or vomiting. 09/15/18  Yes Leandrew Koyanagi, MD  oxyCODONE (OXY IR/ROXICODONE) 5 MG immediate release tablet Take 1-3 tablets (5-15 mg total) by mouth every 4 (four) hours as needed. 09/15/18  Yes Leandrew Koyanagi, MD  promethazine (PHENERGAN) 25 MG tablet Take 1 tablet (25 mg  total) by mouth every 6 (six) hours as needed for nausea. 09/15/18  Yes Leandrew Koyanagi, MD  senna-docusate (SENOKOT S) 8.6-50 MG tablet Take 1 tablet by mouth at bedtime as needed. 09/15/18  Yes Leandrew Koyanagi, MD  sucroferric oxyhydroxide (VELPHORO) 500 MG chewable tablet Chew 2 capsules by mouth See admin instructions. 2 each three times daily with meals, and 2 for 1 daily snack   Yes [provider]  sulfamethoxazole-trimethoprim (BACTRIM) 400-80 MG tablet Take 1 tablet by mouth at bedtime. Patient not taking: Reported on 11/03/2018 09/16/18   Leandrew Koyanagi, MD    Inpatient Medications: Scheduled Meds: . Chlorhexidine Gluconate Cloth  6 each Topical Q0600  . gabapentin  100 mg Oral QHS  . multivitamin  1 tablet Oral QHS  . sodium chloride flush  3 mL Intravenous Q12H  . sucroferric oxyhydroxide  1,000 mg Oral TID WC   Continuous Infusions: . diltiazem (CARDIZEM) infusion Stopped (11/04/18 0207)  . heparin 1,050 Units/hr (11/04/18 0306)   PRN Meds: acetaminophen **OR** acetaminophen, albuterol, ondansetron **OR** ondansetron (ZOFRAN) IV, oxyCODONE, sucroferric oxyhydroxide  Allergies:    Allergies  Allergen Reactions  . Iodine Shortness Of Breath  . Shellfish Allergy Shortness Of Breath  . Norvasc [Amlodipine] Swelling    SWELLING REACTION UNSPECIFIED     Social History:   Social History   Socioeconomic History  . Marital status: Single    Spouse name: Not on file  . Number of children: Not on file  . Years of education: Not on file  . Highest education level: Not on file  Occupational History  . Not on file  Social Needs  . Financial resource strain: Not on file  . Food insecurity:    Worry: Not on file    Inability: Not on file  . Transportation needs:    Medical: Not on file    Non-medical: Not on file  Tobacco Use  . Smoking status: Never Smoker  . Smokeless tobacco: Never Used  Substance and Sexual Activity  . Alcohol use: No    Alcohol/week: 0.0  standard drinks  . Drug use: No  . Sexual activity: Not on file  Lifestyle  . Physical activity:    Days per week: Not on file    Minutes per session: Not on file  . Stress: Not on file  Relationships  . Social connections:    Talks on phone: Not on file    Gets together: Not on file    Attends religious service: Not on file    Active member of club or organization: Not on file    Attends meetings of clubs or organizations: Not on file    Relationship status: Not on file  . Intimate partner violence:    Fear of current or ex partner: Not on file    Emotionally abused: Not on file    Physically abused: Not on file    Forced sexual activity: Not on file  Other Topics Concern  . Not on file  Social History Narrative   Lives in [place] alone. 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    Family History:    Family History  Problem Relation Age of Onset  . Lung cancer Father        died @ 66  . Hypertension Mother        alive @ 23  . Diabetes Brother   . Kidney disease Brother      ROS:  Please see the history of present illness.   All other ROS reviewed and negative.     Physical Exam/Data:   Vitals:   11/04/18 0900 11/04/18 0930 11/04/18 1000 11/04/18 1030  BP: (!) 92/52 (!) 100/54 (!) 85/44 (!) 107/45  Pulse: 63  68 77  Resp: 11 12 14 16   Temp:      TempSrc:      SpO2: 97%  97% 99%  Weight:      Height:        Intake/Output Summary (Last 24 hours) at 11/04/2018 1127 Last data filed at 11/04/2018 0306 Gross per 24 hour  Intake 524.33 ml  Output -  Net 524.33 ml   Filed Weights   11/03/18 1515  Weight: 102.1 kg   Body mass index is 37.44 kg/m.  General:  Well nourished, well developed, in no acute distress HEENT: normal Neck:  no JVD Endocrine:  No thryomegaly Vascular: No carotid bruits; FA pulses 2+ bilaterally without bruits  Cardiac:  normal S1, S2; RRR; no murmur  Lungs:  clear to auscultation bilaterally, no wheezing, rhonchi  or rales  Abd: soft, nontender, no hepatomegaly  Ext: no edema Musculoskeletal:  No deformities, BUE and BLE strength normal and equal Skin: warm and dry  Neuro:  CNs 2-12 intact, no focal abnormalities noted Psych:  Normal affect   EKG:  The EKG was personally reviewed and demonstrates: Sinus rhythm, 99 bpm, abnormal R wave progression-not new Telemetry:  Telemetry was personally reviewed and demonstrates: Sinus rhythm in the 60s-70s  Relevant CV Studies:  Myocardial perfusion imaging 04/17/2017 Study Highlights    Nuclear stress EF: 68%.  There was no ST segment deviation noted during stress.  The study is normal.  This is a low risk study.  The left ventricular ejection fraction is hyperdynamic (>65%).   Normal pharmacologic nuclear study with no evidence of prior infarct or ischemia.     Laboratory Data:  Chemistry Recent Labs  Lab 11/03/18 1529 11/04/18 0216  NA 137 133*  K 4.1 5.2*  CL 94* 94*  CO2 25 21*  GLUCOSE 93 126*  BUN 39* 48*  CREATININE 8.81* 9.22*  CALCIUM 9.1 8.5*  GFRNONAA 4* 4*  GFRAA 5* 5*  ANIONGAP 18* 18*    Recent Labs  Lab 11/04/18 0216  ALBUMIN 3.2*   Hematology Recent Labs  Lab 11/03/18 1529 11/04/18 0216  WBC 9.2 7.4  RBC 3.87 3.61*  HGB 12.2 11.0*  HCT 39.3 36.4  MCV 101.6* 100.8*  MCH 31.5 30.5  MCHC 31.0 30.2  RDW 15.8* 15.5  PLT 197 166   Cardiac Enzymes Recent Labs  Lab 11/03/18 2241 11/04/18 0216 11/04/18 0827  TROPONINI 0.08* 0.09* 0.08*    Recent Labs  Lab 11/03/18 1546  TROPIPOC 0.00    BNPNo results for input(s): BNP, PROBNP in the last 168 hours.  DDimer No results for input(s): DDIMER in the last 168 hours.  Radiology/Studies:  Ct Angio Chest Pe W/cm &/or Wo Cm  Result Date: 11/04/2018 CLINICAL DATA:  62 year old female with chest pain. Concern for pulmonary embolism. EXAM: CT ANGIOGRAPHY CHEST WITH CONTRAST TECHNIQUE: Multidetector CT imaging of the chest was performed using the standard  protocol during bolus administration of intravenous contrast. Multiplanar CT image reconstructions and MIPs were obtained to evaluate the vascular anatomy. CONTRAST:  164mL ISOVUE-370 IOPAMIDOL (ISOVUE-370) INJECTION 76% COMPARISON:  Chest radiograph dated 11/03/2018 FINDINGS: Cardiovascular: Top-normal cardiac size. No pericardial effusion. There is multi vessel coronary vascular calcification. Moderate atherosclerotic calcification of the thoracic aorta. No aneurysmal dilatation or evidence of dissection. Evaluation of the pulmonary arteries is very limited due to suboptimal opacification and timing of the contrast and poor visualization of the peripheral branches. No large or central pulmonary artery embolus identified. Mediastinum/Nodes: There is no hilar or mediastinal adenopathy. The esophagus and the thyroid gland are grossly unremarkable. No mediastinal fluid collection. Lungs/Pleura: There are minimal bibasilar atelectatic changes. No focal consolidation, pleural effusion, or pneumothorax. A 6 mm calcified left upper lobe granuloma. The central airways are patent. Upper Abdomen: Left adrenal thickening/hyperplasia. Moderate right renal atrophy. Low attenuating lesions arising from the right kidney measure up to 6 cm in the superior pole are not characterized but likely represent cysts. Musculoskeletal: Degenerative changes of the spine. Multilevel anterior bridging osteophyte most consistent with DISH. No acute osseous pathology. Review of the MIP images confirms the above findings. IMPRESSION: 1.  No acute intrathoracic pathology. No CT evidence of central pulmonary artery embolus. 2. Coronary vascular calcification and Aortic Atherosclerosis (ICD10-I70.0). Electronically Signed   By: Anner Crete M.D.   On: 11/04/2018 00:32   Dg Chest Port 1 View  Result Date: 11/03/2018 CLINICAL DATA:  Acute chest pain today. EXAM: PORTABLE CHEST 1 VIEW COMPARISON:  04/27/2014 and prior exams FINDINGS: UPPER  limits normal heart size and pulmonary vascular congestion noted. Mild bibasilar atelectasis identified. There is no evidence of focal airspace disease, pulmonary edema, suspicious pulmonary nodule/mass, pleural effusion, or pneumothorax. No acute bony abnormalities are identified. IMPRESSION: UPPER limits normal heart size with pulmonary vascular congestion. Electronically Signed   By: Margarette Canada M.D.   On: 11/03/2018 16:40   Xr Hip Unilat W Or W/o Pelvis 2-3 Views Left  Result Date: 11/03/2018 Severe degenerative joint disease of her left hip secondary to avascular necrosis  Xr Hip Unilat W Or W/o Pelvis 2-3 Views Right  Result Date: 11/03/2018 Stable appearing right total hip replacement without complication.  Vas [place] Lower Extremity Venous (dvt) (only [facility])  Result Date: 11/03/2018  Lower Venous Study Indications: Tachycardia.  Performing Technologist: Abram Sander RVS  Examination Guidelines: A complete evaluation includes B-mode imaging, spectral Doppler, color Doppler, and power Doppler as needed of all accessible portions of each vessel. Bilateral testing is considered an integral part of a complete examination. Limited examinations for reoccurring indications may be performed as noted.  Right Venous Findings: +---------+---------------+---------+-----------+----------+--------------+          CompressibilityPhasicitySpontaneityPropertiesSummary        +---------+---------------+---------+-----------+----------+--------------+ CFV      None           No       No                                  +---------+---------------+---------+-----------+----------+--------------+ SFJ      Full                                                        +---------+---------------+---------+-----------+----------+--------------+ FV Prox  None                                                        +---------+---------------+---------+-----------+----------+--------------+ FV Mid    None                                                        +---------+---------------+---------+-----------+----------+--------------+ FV DistalNone                                                        +---------+---------------+---------+-----------+----------+--------------+ PFV      Full                                                        +---------+---------------+---------+-----------+----------+--------------+  POP      None           No       No                                  +---------+---------------+---------+-----------+----------+--------------+ PTV      None                                                        +---------+---------------+---------+-----------+----------+--------------+ PERO                                                  not visualized +---------+---------------+---------+-----------+----------+--------------+  Left Venous Findings: +---------+---------------+---------+-----------+----------+-------+          CompressibilityPhasicitySpontaneityPropertiesSummary +---------+---------------+---------+-----------+----------+-------+ CFV      Full           Yes      Yes                          +---------+---------------+---------+-----------+----------+-------+ SFJ      Full                                                 +---------+---------------+---------+-----------+----------+-------+ FV Prox  Full                                                 +---------+---------------+---------+-----------+----------+-------+ FV Mid   Full                                                 +---------+---------------+---------+-----------+----------+-------+ FV DistalFull                                                 +---------+---------------+---------+-----------+----------+-------+ PFV      Full                                                  +---------+---------------+---------+-----------+----------+-------+ POP      Full           Yes      Yes                          +---------+---------------+---------+-----------+----------+-------+ PTV      Full                                                 +---------+---------------+---------+-----------+----------+-------+  PERO     Full                                                 +---------+---------------+---------+-----------+----------+-------+    Summary: Right: Findings consistent with acute deep vein thrombosis involving the right common femoral vein, right femoral vein, right popliteal vein, and right posterior tibial vein. Left: There is no evidence of deep vein thrombosis in the lower extremity. No cystic structure found in the popliteal fossa.  *See table(s) above for measurements and observations.    Preliminary     Assessment and Plan:   Atrial fibrillation with RVR -Patient has a history of paroxysmal A. fib and was previously on diltiazem which was discontinued at some point due to hypotension during dial-CHA2DS2/VAS Stroke Risk Score is 3 (HTN, DM, female).  Brenda Arnold had not had any recurrences of A. fib and at least the last 2 years, likely much longer. -Converted to sinus rhythm on brief treatment with IV diltiazem last night around 1915.  Has maintained sinus rhythm through the night and today.  Her presenting symptoms totally resolved with conversion to sinus rhythm. -No rate control medications at present with no plan to initiate any medications given patient's hypotension and current maintenance of sinus rhythm. -Found to have DVT, no PE of central pulmonary artery, limited study. - Has not been anticoagulated for the past 2 years due to no recurrences of A. Fib. Now has been started on heparin for stroke risk reduction.  Brenda Arnold will need to initiate Coumadin for stroke risk reduction in the setting of ESRD. -No prior echocardiogram done so will assess an  echocardiogram for LV function and valve status.   Chest pain -The patient's symptoms were very associated with her episode of A. fib and resolved with conversion to sinus rhythm. No recent chest pain or shortness of breath outside of during afib.  -Chest CTA negative for PE, but did show multivessel coronary vascular calcifications -Troponins mildly elevated and flat pattern 0.09, consistent with ESRD and demand ischemia due to episode of A. fib with RVR. -The patient is currently completely asymptomatic, therefore no indication for further cardiac evaluation.  End-stage renal disease on hemodialysis MWF -Management per nephrology.    DVT right lower extremity -Involving right common femoral vein, right femoral vein, right popliteal vein, right posterior tibial vein -History of right total hip arthroplasty 09/15/2018 -Chest CTA showed no evidence of central pulmonary artery embolus-limited study due to suboptimal opacification -No evidence of RV strain on EKG, check echocardiogram -Patient is on heparin drip for treatment of DVT as well as A. Fib, recommend transition to warfarin     For questions or updates, please contact [place] Please consult [email or web address] for contact info under     Signed, Daune Perch, NP  11/04/2018 11:27 AM  ---------------------------------------------------------------------------------------------   History and all data above reviewed.  Patient examined.  I agree with the findings as above.  Brenda Arnold is a pleasant 62 year old female admitted for chest pain and atrial fibrillation, currently in sinus rhythm after conversion yesterday evening.  Brenda Arnold is also noted to have right lower extremity DVT, likely provoked after hip replacement in mid October.  Brenda Arnold is a patient of Dr. Golden Hurter.  Constitutional: No acute distress Eyes: pupils equally round and reactive to light, sclera non-icteric, normal conjunctiva and lids ENMT: normal  dentition,  moist mucous membranes Cardiovascular: regular rhythm, normal rate, no murmurs. S1 and S2 normal. Radial pulses right arm, left arm there there is a fistula with appropriate thrill.  No jugular venous distention.  Respiratory: clear to auscultation bilaterally GI : normal bowel sounds, soft and nontender. No distention.   MSK: extremities warm, well perfused. No edema.  LYMPH: No lymphadenopathy noted of the head and neck NEURO: grossly nonfocal exam, moves all extremities. PSYCH: alert and oriented x 3, normal mood and affect.   All available labs, radiology testing, previous records reviewed. Agree with documented assessment and plan of my colleague as stated above with the following additions or changes:  Principal Problem:   Chest pain Active Problems:   Paroxysmal atrial fibrillation (HCC)   DM (diabetes mellitus) ([place])   DVT (deep venous thrombosis) (HCC)   Hypotension   ESRD on hemodialysis ([place])   Plan: Fortunately Brenda Arnold is in sinus rhythm after converting spontaneously yesterday evening.  There is no indication for rate control currently given her low blood pressures.  Brenda Arnold is currently on heparin infusion, however will need treatment of her DVTs.  I would recommend reinitiation of anticoagulation for atrial fibrillation as well for a chadsvasc score of 3.   We will obtain an echocardiogram to ensure that there is no RV strain given that the PE study was limited to an assessment of the central pulmonary arteries due to technical limitations on the scan.  Elouise Munroe, MD [facility] 2:02 PM  11/04/2018

## 2018-11-04 NOTE — Progress Notes (Addendum)
[place]din for Heparin and start Warfarin  Indication:  VTE treatment (new bilateral DVTs)  Patient Measurements: Height: 5\' 5"  (165.1 cm) Weight: 223 lb 15.8 oz (101.6 kg) IBW/kg (Calculated) : 57 Heparin Dosing Weight: 80 kg   Vital Signs: Temp: 98 F (36.7 C) (12/04 1805) Temp Source: Oral (12/04 1805) BP: 91/54 (12/04 1846) Pulse Rate: 76 (12/04 1805)  Labs: Recent Labs    11/03/18 1529 11/03/18 2241 11/04/18 0216 11/04/18 0350 11/04/18 0827  HGB 12.2  --  11.0*  --   --   HCT 39.3  --  36.4  --   --   PLT 197  --  166  --   --   HEPARINUNFRC  --   --  1.24* 0.52 0.24*  CREATININE 8.81*  --  9.22*  --   --   TROPONINI  --  0.08* 0.09*  --  0.08*    Assessment: 64 YOF who presented with chest pain. She has a h/o of Afib not on anticoagulation. Currently on Afib with RVR. She was started on a heparin drip yesterday. This morning's heparin level was subtherapeutic and heparin drip rate was increased.  The * hour heparin level is due at 20:00 tonight.  She is ESRD on HD, best oral agent would likely be warfarin. Pharmacy has been consulted to start Warfarin for VTE treatment.   Goal of Therapy:  INR = 2-3  Heparin level 0.3-0.7 units/ml Monitor platelets by anticoagulation protocol: Yes   Plan:  Give warfarin 7.5 mg po x1 tonight Heparin drip was increased earlier today to 1250 units/hr -will followup 8 hr HL due at 20:00 -Daily HL, INR and CBC   Nicole Cella, RPh Clinical Pharmacist Please check AMION for all [place] phone numbers After 10:00 PM, call [place] [phone] 11/04/2018 7:06 PM  ADDENDUM:   8 hr heparin level is 0.54, therapeutic on heparin drip 1250 units/hr Plan : continue heparin 1250 units/hr Check heparin level with AM labs.  Nicole Cella, RPh Clinical Pharmacist Please check AMION for all [place] phone numbers After 10:00 PM, call [place] [phone] 11/04/2018 9:06 PM

## 2018-11-05 ENCOUNTER — Observation Stay (HOSPITAL_BASED_OUTPATIENT_CLINIC_OR_DEPARTMENT_OTHER): Payer: BLUE CROSS/BLUE SHIELD

## 2018-11-05 ENCOUNTER — Encounter (HOSPITAL_COMMUNITY): Payer: Self-pay | Admitting: General Practice

## 2018-11-05 ENCOUNTER — Other Ambulatory Visit: Payer: Self-pay

## 2018-11-05 DIAGNOSIS — E282 Polycystic ovarian syndrome: Secondary | ICD-10-CM | POA: Diagnosis present

## 2018-11-05 DIAGNOSIS — Z992 Dependence on renal dialysis: Secondary | ICD-10-CM | POA: Diagnosis not present

## 2018-11-05 DIAGNOSIS — D631 Anemia in chronic kidney disease: Secondary | ICD-10-CM | POA: Diagnosis present

## 2018-11-05 DIAGNOSIS — I2510 Atherosclerotic heart disease of native coronary artery without angina pectoris: Secondary | ICD-10-CM

## 2018-11-05 DIAGNOSIS — J45909 Unspecified asthma, uncomplicated: Secondary | ICD-10-CM | POA: Diagnosis present

## 2018-11-05 DIAGNOSIS — I120 Hypertensive chronic kidney disease with stage 5 chronic kidney disease or end stage renal disease: Secondary | ICD-10-CM | POA: Diagnosis not present

## 2018-11-05 DIAGNOSIS — I4891 Unspecified atrial fibrillation: Secondary | ICD-10-CM | POA: Diagnosis present

## 2018-11-05 DIAGNOSIS — R791 Abnormal coagulation profile: Secondary | ICD-10-CM | POA: Diagnosis present

## 2018-11-05 DIAGNOSIS — E1122 Type 2 diabetes mellitus with diabetic chronic kidney disease: Secondary | ICD-10-CM | POA: Diagnosis not present

## 2018-11-05 DIAGNOSIS — G4730 Sleep apnea, unspecified: Secondary | ICD-10-CM | POA: Diagnosis present

## 2018-11-05 DIAGNOSIS — E785 Hyperlipidemia, unspecified: Secondary | ICD-10-CM | POA: Diagnosis present

## 2018-11-05 DIAGNOSIS — I340 Nonrheumatic mitral (valve) insufficiency: Secondary | ICD-10-CM

## 2018-11-05 DIAGNOSIS — I4811 Longstanding persistent atrial fibrillation: Secondary | ICD-10-CM | POA: Diagnosis not present

## 2018-11-05 DIAGNOSIS — Z6838 Body mass index (BMI) 38.0-38.9, adult: Secondary | ICD-10-CM | POA: Diagnosis not present

## 2018-11-05 DIAGNOSIS — Z833 Family history of diabetes mellitus: Secondary | ICD-10-CM | POA: Diagnosis not present

## 2018-11-05 DIAGNOSIS — I959 Hypotension, unspecified: Secondary | ICD-10-CM | POA: Diagnosis present

## 2018-11-05 DIAGNOSIS — N186 End stage renal disease: Secondary | ICD-10-CM | POA: Diagnosis not present

## 2018-11-05 DIAGNOSIS — Z90710 Acquired absence of both cervix and uterus: Secondary | ICD-10-CM | POA: Diagnosis not present

## 2018-11-05 DIAGNOSIS — I248 Other forms of acute ischemic heart disease: Secondary | ICD-10-CM | POA: Diagnosis present

## 2018-11-05 DIAGNOSIS — R072 Precordial pain: Secondary | ICD-10-CM | POA: Diagnosis present

## 2018-11-05 DIAGNOSIS — R079 Chest pain, unspecified: Secondary | ICD-10-CM | POA: Diagnosis not present

## 2018-11-05 DIAGNOSIS — I25119 Atherosclerotic heart disease of native coronary artery with unspecified angina pectoris: Secondary | ICD-10-CM

## 2018-11-05 DIAGNOSIS — N2581 Secondary hyperparathyroidism of renal origin: Secondary | ICD-10-CM | POA: Diagnosis present

## 2018-11-05 DIAGNOSIS — M1990 Unspecified osteoarthritis, unspecified site: Secondary | ICD-10-CM | POA: Diagnosis present

## 2018-11-05 DIAGNOSIS — I480 Paroxysmal atrial fibrillation: Secondary | ICD-10-CM | POA: Diagnosis not present

## 2018-11-05 DIAGNOSIS — E8889 Other specified metabolic disorders: Secondary | ICD-10-CM | POA: Diagnosis present

## 2018-11-05 DIAGNOSIS — I82411 Acute embolism and thrombosis of right femoral vein: Secondary | ICD-10-CM | POA: Diagnosis not present

## 2018-11-05 DIAGNOSIS — E669 Obesity, unspecified: Secondary | ICD-10-CM | POA: Diagnosis present

## 2018-11-05 DIAGNOSIS — Z8249 Family history of ischemic heart disease and other diseases of the circulatory system: Secondary | ICD-10-CM | POA: Diagnosis not present

## 2018-11-05 DIAGNOSIS — Z96641 Presence of right artificial hip joint: Secondary | ICD-10-CM | POA: Diagnosis present

## 2018-11-05 LAB — CBC
HCT: 37.4 % (ref 36.0–46.0)
Hemoglobin: 11.8 g/dL — ABNORMAL LOW (ref 12.0–15.0)
MCH: 31.1 pg (ref 26.0–34.0)
MCHC: 31.6 g/dL (ref 30.0–36.0)
MCV: 98.7 fL (ref 80.0–100.0)
Platelets: 150 10*3/uL (ref 150–400)
RBC: 3.79 MIL/uL — ABNORMAL LOW (ref 3.87–5.11)
RDW: 15.5 % (ref 11.5–15.5)
WBC: 7.1 10*3/uL (ref 4.0–10.5)
nRBC: 0 % (ref 0.0–0.2)

## 2018-11-05 LAB — ECHOCARDIOGRAM COMPLETE
Height: 65 in
Weight: 3583.8 oz

## 2018-11-05 LAB — PROTIME-INR
INR: 1.1
Prothrombin Time: 14.1 seconds (ref 11.4–15.2)

## 2018-11-05 LAB — GLUCOSE, CAPILLARY
Glucose-Capillary: 65 mg/dL — ABNORMAL LOW (ref 70–99)
Glucose-Capillary: 80 mg/dL (ref 70–99)
Glucose-Capillary: 105 mg/dL — ABNORMAL HIGH (ref 70–99)
Glucose-Capillary: 80 mg/dL (ref 70–99)

## 2018-11-05 LAB — HEPARIN LEVEL (UNFRACTIONATED): Heparin Unfractionated: 0.66 IU/mL (ref 0.30–0.70)

## 2018-11-05 MED ORDER — WARFARIN SODIUM 7.5 MG PO TABS
7.5000 mg | ORAL_TABLET | Freq: Once | ORAL | Status: AC
  Administered 2018-11-05: 7.5 mg via ORAL
  Filled 2018-11-05: qty 1

## 2018-11-05 MED ORDER — ATORVASTATIN CALCIUM 40 MG PO TABS
40.0000 mg | ORAL_TABLET | Freq: Every day | ORAL | Status: DC
  Administered 2018-11-07 – 2018-11-08 (×2): 40 mg via ORAL
  Filled 2018-11-05 (×3): qty 1

## 2018-11-05 MED ORDER — CALCITRIOL 0.25 MCG PO CAPS
1.5000 ug | ORAL_CAPSULE | ORAL | Status: DC
  Administered 2018-11-06 – 2018-11-09 (×2): 1.5 ug via ORAL
  Filled 2018-11-05: qty 6

## 2018-11-05 NOTE — Progress Notes (Signed)
Progress Note  Patient Name: Brenda Arnold Date of Encounter: 11/05/2018  Primary Cardiologist: Fransico Him, MD   Subjective   Feels well, no recurrent afib  Inpatient Medications    Scheduled Meds: . [START ON 11/06/2018] calcitRIOL  1.5 mcg Oral Once per day on Mon Wed Fri  . Chlorhexidine Gluconate Cloth  6 each Topical Q0600  . gabapentin  100 mg Oral QHS  . multivitamin  1 tablet Oral QHS  . sodium chloride flush  3 mL Intravenous Q12H  . sucroferric oxyhydroxide  1,000 mg Oral TID WC  . warfarin  7.5 mg Oral ONCE-1800  . Warfarin - Pharmacist Dosing Inpatient   Does not apply q1800   Continuous Infusions: . diltiazem (CARDIZEM) infusion Stopped (11/04/18 0207)  . heparin 1,250 Units/hr (11/05/18 1245)   PRN Meds: acetaminophen **OR** acetaminophen, albuterol, ondansetron **OR** ondansetron (ZOFRAN) IV, oxyCODONE, sucroferric oxyhydroxide   Vital Signs    Vitals:   11/04/18 1846 11/05/18 0658 11/05/18 1100 11/05/18 1220  BP: (!) 91/54 112/64 (!) 96/56 (!) 90/49  Pulse:  85 77 77  Resp: 18 11 12    Temp: 97.7 F (36.5 C)   98.9 F (37.2 C)  TempSrc: Oral   Oral  SpO2:  100% 98% 97%  Weight:      Height:        Intake/Output Summary (Last 24 hours) at 11/05/2018 1335 Last data filed at 11/05/2018 1056 Gross per 24 hour  Intake 283 ml  Output 2264 ml  Net -1981 ml   Filed Weights   11/03/18 1515 11/04/18 1400 11/04/18 1805  Weight: 102.1 kg 103.4 kg 101.6 kg    Telemetry     nsr - Personally Reviewed  ECG    No new - Personally Reviewed  Physical Exam   GEN: No acute distress.   Neck: No JVD Cardiac: regular rhythm, normal rate, no murmurs, rubs, or gallops.  Respiratory: Clear to auscultation bilaterally. GI: Soft, nontender, non-distended  MS: No edema; No deformity.  Neuro:  Nonfocal  Psych: Normal affect  Vasc: left arm fistula normal. Labs    Chemistry Recent Labs  Lab 11/03/18 1529 11/04/18 0216  NA 137 133*  K 4.1 5.2*   CL 94* 94*  CO2 25 21*  GLUCOSE 93 126*  BUN 39* 48*  CREATININE 8.81* 9.22*  CALCIUM 9.1 8.5*  ALBUMIN  --  3.2*  GFRNONAA 4* 4*  GFRAA 5* 5*  ANIONGAP 18* 18*     Hematology Recent Labs  Lab 11/03/18 1529 11/04/18 0216 11/05/18 0533  WBC 9.2 7.4 7.1  RBC 3.87 3.61* 3.79*  HGB 12.2 11.0* 11.8*  HCT 39.3 36.4 37.4  MCV 101.6* 100.8* 98.7  MCH 31.5 30.5 31.1  MCHC 31.0 30.2 31.6  RDW 15.8* 15.5 15.5  PLT 197 166 150    Cardiac Enzymes Recent Labs  Lab 11/03/18 2241 11/04/18 0216 11/04/18 0827  TROPONINI 0.08* 0.09* 0.08*    Recent Labs  Lab 11/03/18 1546  TROPIPOC 0.00     BNPNo results for input(s): BNP, PROBNP in the last 168 hours.   DDimer No results for input(s): DDIMER in the last 168 hours.   Radiology    Ct Angio Chest Pe W/cm &/or Wo Cm  Result Date: 11/04/2018 CLINICAL DATA:  62 year old female with chest pain. Concern for pulmonary embolism. EXAM: CT ANGIOGRAPHY CHEST WITH CONTRAST TECHNIQUE: Multidetector CT imaging of the chest was performed using the standard protocol during bolus administration of intravenous contrast. Multiplanar CT image  reconstructions and MIPs were obtained to evaluate the vascular anatomy. CONTRAST:  161mL ISOVUE-370 IOPAMIDOL (ISOVUE-370) INJECTION 76% COMPARISON:  Chest radiograph dated 11/03/2018 FINDINGS: Cardiovascular: Top-normal cardiac size. No pericardial effusion. There is multi vessel coronary vascular calcification. Moderate atherosclerotic calcification of the thoracic aorta. No aneurysmal dilatation or evidence of dissection. Evaluation of the pulmonary arteries is very limited due to suboptimal opacification and timing of the contrast and poor visualization of the peripheral branches. No large or central pulmonary artery embolus identified. Mediastinum/Nodes: There is no hilar or mediastinal adenopathy. The esophagus and the thyroid gland are grossly unremarkable. No mediastinal fluid collection. Lungs/Pleura:  There are minimal bibasilar atelectatic changes. No focal consolidation, pleural effusion, or pneumothorax. A 6 mm calcified left upper lobe granuloma. The central airways are patent. Upper Abdomen: Left adrenal thickening/hyperplasia. Moderate right renal atrophy. Low attenuating lesions arising from the right kidney measure up to 6 cm in the superior pole are not characterized but likely represent cysts. Musculoskeletal: Degenerative changes of the spine. Multilevel anterior bridging osteophyte most consistent with DISH. No acute osseous pathology. Review of the MIP images confirms the above findings. IMPRESSION: 1. No acute intrathoracic pathology. No CT evidence of central pulmonary artery embolus. 2. Coronary vascular calcification and Aortic Atherosclerosis (ICD10-I70.0). Electronically Signed   By: Anner Crete M.D.   On: 11/04/2018 00:32   Dg Chest Port 1 View  Result Date: 11/03/2018 CLINICAL DATA:  Acute chest pain today. EXAM: PORTABLE CHEST 1 VIEW COMPARISON:  04/27/2014 and prior exams FINDINGS: UPPER limits normal heart size and pulmonary vascular congestion noted. Mild bibasilar atelectasis identified. There is no evidence of focal airspace disease, pulmonary edema, suspicious pulmonary nodule/mass, pleural effusion, or pneumothorax. No acute bony abnormalities are identified. IMPRESSION: UPPER limits normal heart size with pulmonary vascular congestion. Electronically Signed   By: Margarette Canada M.D.   On: 11/03/2018 16:40   Xr Hip Unilat W Or W/o Pelvis 2-3 Views Left  Result Date: 11/03/2018 Severe degenerative joint disease of her left hip secondary to avascular necrosis  Xr Hip Unilat W Or W/o Pelvis 2-3 Views Right  Result Date: 11/03/2018 Stable appearing right total hip replacement without complication.  Vas [place] Lower Extremity Venous (dvt) (only [facility])  Result Date: 11/04/2018  Lower Venous Study Indications: Tachycardia.  Performing Technologist: Abram Sander RVS   Examination Guidelines: A complete evaluation includes B-mode imaging, spectral Doppler, color Doppler, and power Doppler as needed of all accessible portions of each vessel. Bilateral testing is considered an integral part of a complete examination. Limited examinations for reoccurring indications may be performed as noted.  Right Venous Findings: +---------+---------------+---------+-----------+----------+--------------+          CompressibilityPhasicitySpontaneityPropertiesSummary        +---------+---------------+---------+-----------+----------+--------------+ CFV      None           No       No                                  +---------+---------------+---------+-----------+----------+--------------+ SFJ      Full                                                        +---------+---------------+---------+-----------+----------+--------------+ FV Prox  None                                                        +---------+---------------+---------+-----------+----------+--------------+  FV Mid   None                                                        +---------+---------------+---------+-----------+----------+--------------+ FV DistalNone                                                        +---------+---------------+---------+-----------+----------+--------------+ PFV      Full                                                        +---------+---------------+---------+-----------+----------+--------------+ POP      None           No       No                                  +---------+---------------+---------+-----------+----------+--------------+ PTV      None                                                        +---------+---------------+---------+-----------+----------+--------------+ PERO                                                  not visualized +---------+---------------+---------+-----------+----------+--------------+   Left Venous Findings: +---------+---------------+---------+-----------+----------+-------+          CompressibilityPhasicitySpontaneityPropertiesSummary +---------+---------------+---------+-----------+----------+-------+ CFV      Full           Yes      Yes                          +---------+---------------+---------+-----------+----------+-------+ SFJ      Full                                                 +---------+---------------+---------+-----------+----------+-------+ FV Prox  Full                                                 +---------+---------------+---------+-----------+----------+-------+ FV Mid   Full                                                 +---------+---------------+---------+-----------+----------+-------+ FV DistalFull                                                 +---------+---------------+---------+-----------+----------+-------+  PFV      Full                                                 +---------+---------------+---------+-----------+----------+-------+ POP      Full           Yes      Yes                          +---------+---------------+---------+-----------+----------+-------+ PTV      Full                                                 +---------+---------------+---------+-----------+----------+-------+ PERO     Full                                                 +---------+---------------+---------+-----------+----------+-------+    Summary: Right: Findings consistent with acute deep vein thrombosis involving the right common femoral vein, right femoral vein, right popliteal vein, and right posterior tibial vein. Left: There is no evidence of deep vein thrombosis in the lower extremity. No cystic structure found in the popliteal fossa.  *See table(s) above for measurements and observations. Electronically signed by Quay Burow MD on 11/04/2018 at 4:56:11 PM.    Final     Cardiac Studies    Echo normal ef, no right heart strain  Patient Profile   Assessment & Plan   Principal Problem:   Chest pain Active Problems:   Paroxysmal atrial fibrillation (HCC)   DM (diabetes mellitus) ([place])   DVT (deep venous thrombosis) (HCC)   Hypotension   ESRD on hemodialysis ([place])   No evidence of right heart strain on echo. She should continue anticoagulation for PE as well as afib. After cessation of anticoagulation for presumed provoked DVT, anticoagulation should continue for afib.   She has 3 vessel coronary calcifications on CT chest (non ecg gated), and with presentation with chest pain. She has had normal stress imaging last year, and may benefit from medical management without further ischemia testing. This can be discussed further as an outpatient.  I will initiate atorvastatin 40 mg daily since on chart review I do not see a contraindication to statins or an intolerance listed.   [facility] will sign off.   Medication Recommendations:  Anticoagulation for afib. Atorvastatin 40 mg daily (new) Other recommendations (labs, testing, etc):  Recommend medical management of coronary disease (3 vessel CAC on CT) Follow up as an outpatient:  1 mo with Dr. Radford Pax [facility] clinic.  For questions or updates, please contact [place] Please consult [email or web address] for contact info under        Signed, Elouise Munroe, MD  11/05/2018, 1:35 PM

## 2018-11-05 NOTE — Evaluation (Signed)
Physical Therapy Evaluation Patient Details Name: Brenda Arnold MRN: [id number] DOB: Aug 01, 1956 Today's Date: 11/05/2018   History of Present Illness   62 y.o. female with medical history significant of ESRD on HD, PAF, HTN, HLD, DM type 2, and hypotension; who presents with acute onset chest pain.   Pt found to have afib with RVR, now resolved and R LE DVT which is being treated.  Clinical Impression  Pt admitted with/for CP.  Found to have afib and dvt.  Awaiting correction of INR levels  Pt at supervision to Independent level.  Pt currently limited functionally due to the problems listed below.  (see problems list.)  Pt will benefit from PT to maximize function and safety to be able to get home safely with available assist.     Follow Up Recommendations No PT follow up    Equipment Recommendations  None recommended by PT    Recommendations for Other Services       Precautions / Restrictions Precautions Precautions: None      Mobility  Bed Mobility Overal bed mobility: Modified Independent                Transfers Overall transfer level: Modified independent                  Ambulation/Gait Ambulation/Gait assistance: Supervision Gait Distance (Feet): 200 Feet Assistive device: Rolling walker (2 wheeled) Gait Pattern/deviations: Step-through pattern     General Gait Details: mildly antalgic, but generally steady  Stairs            Wheelchair Mobility    Modified Rankin (Stroke Patients Only)       Balance Overall balance assessment: Needs assistance   Sitting balance-Leahy Scale: Good       Standing balance-Leahy Scale: Fair                               Pertinent Vitals/Pain Pain Assessment: Faces Faces Pain Scale: Hurts little more Pain Location: l hip Pain Descriptors / Indicators: Aching;Grimacing;Sore Pain Intervention(s): Monitored during session    Home Living Family/patient expects to be discharged  to:: Private residence Living Arrangements: Alone Available Help at Discharge: Family;Available 24 hours/day(for 3 more weeks) Type of Home: House Home Access: Stairs to enter Entrance Stairs-Rails: None Entrance Stairs-Number of Steps: 1 step up inside Home Layout: One level Home Equipment: Walker - 2 wheels;Tub bench      Prior Function Level of Independence: Independent with assistive device(s)         Comments: Pt reports completing ADLs using RW for functional mobility, tub bench in the shower, sock aide for socks     Hand Dominance        Extremity/Trunk Assessment   Upper Extremity Assessment Upper Extremity Assessment: Overall WFL for tasks assessed    Lower Extremity Assessment Lower Extremity Assessment: Overall WFL for tasks assessed(bil mil weakness, left hip painful with resultant weakness)       Communication   Communication: No difficulties  Cognition Arousal/Alertness: Awake/alert Behavior During Therapy: WFL for tasks assessed/performed Overall Cognitive Status: Within Functional Limits for tasks assessed                                        General Comments      Exercises     Assessment/Plan    PT Assessment  Patient needs continued PT services  PT Problem List Decreased strength;Decreased activity tolerance;Pain       PT Treatment Interventions Gait training;Functional mobility training;Therapeutic activities    PT Goals (Current goals can be found in the Care Plan section)  Acute Rehab PT Goals Patient Stated Goal: get myself ready for a left hip replacement PT Goal Formulation: All assessment and education complete, DC therapy    Frequency Min 3X/week   Barriers to discharge        Co-evaluation               AM-PAC PT "6 Clicks" Mobility  Outcome Measure Help needed turning from your back to your side while in a flat bed without using bedrails?: None Help needed moving from lying on your back to  sitting on the side of a flat bed without using bedrails?: None Help needed moving to and from a bed to a chair (including a wheelchair)?: None Help needed standing up from a chair using your arms (e.g., wheelchair or bedside chair)?: None Help needed to walk in hospital room?: None Help needed climbing 3-5 steps with a railing? : A Little 6 Click Score: 23    End of Session   Activity Tolerance: Patient tolerated treatment well Patient left: in chair;with call bell/phone within reach Nurse Communication: Mobility status PT Visit Diagnosis: Other abnormalities of gait and mobility (R26.89)    Time: 4599-7741 PT Time Calculation (min) (ACUTE ONLY): 23 min   Charges:   PT Evaluation $PT Eval Low Complexity: 1 Low PT Treatments $Gait Training: 8-22 mins        11/05/2018  Donnella Sham, PT Acute Rehabilitation Services [phone]  (pager) [phone]  (office)  Brenda Arnold 11/05/2018, 5:51 PM

## 2018-11-05 NOTE — Progress Notes (Signed)
PROGRESS NOTE    Brenda Arnold  AOZ:[id number] DOB: 05/24/1956 DOA: 11/03/2018 PCP: Kelton Pillar, MD    Brief Narrative: 62 y.o.femalewith medical history significant of ESRD on HD, PAF, HTN, HLD, DM type 2, and hypotension; who presents with acute onset chest pain. She had undergone an elective right hip replacement by Dr. Erlinda Hong on 10/15, and was placed on 81 mg of aspirin following the procedure. Patient reports taking this medication as advised and had been doing well. She had gone for a follow-up appointment today, and to also discuss left hip replacement. During the appointment she reports having acute onset of substernal burning pain with chest tightness making it difficult for her to breathe. She initially thought symptoms may be related to the reflux, but symptoms did not improve. She was advised to go to the emergency department. She had shorter episodes once or twice before last week or so. Denies having any leg swelling, calf pain, loss of consciousness, previous blood clots, fever, chills, or diaphoresis. Other associated symptoms include some left hip pain and palpitation. Patient states that she was previously on Coumadin for many years, but had an episode in February 2018 where she had prolonged epistaxis due to INR being elevated to 3.8. Thereafter, patient reports that her primary care provider took her off the Coumadin approximately a year ago. She denies any other issues with bleeding. Last episode of atrial fibrillation was over year ago. She last had a full hemodialysis session on Monday and is due to have a hemodialysis tomorrow. For the last 6 to 8 months she states that she has been having issues with low blood pressures normally running in the 80s over 40s.  ED Course:Upon admission into the emergency department patient was noted to be patient was noted to be in A. fib with RVR with heart rates into the 160s with blood pressures as low as 66/49. Due to  patient previously not being on anticoagulation cardioversion was contraindicated. Patient received 200 mg of hydrocortisone, and 250 mL bolus of normal saline IV fluids for blood pressures. Initial troponin 0. Patient had been started on Cardizem drip. Vascular Doppler ultrasound of lower extremities revealed acute DVT of the right common femoral, popliteal, posterior tibial vein. Patient was started on heparin drip. Critical care was consulted as well as cardiology due to patient's symptoms. Patient did return to sinus rhythm. CT angiogram of the chest was pending due to reported iodine allergy.. Patient was pretreated with Benadryl. TRH called to admit. Blood pressure still noted to be low but mildly improved last noted to be 93/54. Patient reported having one episode of nausea in the ED, but currently reports being chest pain-free.  Assessment & Plan:   Principal Problem:   Chest pain Active Problems:   Paroxysmal atrial fibrillation (HCC)   DM (diabetes mellitus) (HCC)   DVT (deep venous thrombosis) (HCC)   Hypotension   ESRD on hemodialysis (HCC)   CAD (coronary artery disease)   #1 A. fib RVR-patient with history of paroxysmal atrial fibrillation.  She is on heparin and Coumadin at this time.  Her INR is 1.10.  Since she is a dialysis patient and at high risk for bleeding she cannot be on Lovenox or other oral agents.  Appreciate pharmacy input.  Patient concerned about bleeding on while on anticoagulation.  She reports she was on Coumadin in the past when her INR was only in the threes she had epistaxis.  #2 right hip replacement October 2019 now with right  lower extremity acute DVT-continue anticoagulation as above.  Patient does not have a PE.  #3 chest pain resolved once A. fib RVR resolved.  However patient has CAD and three-vessel coronary calcification by CT scan of the chest.  Patient to follow-up with cardiology as an outpatient.  #4 hyperlipidemia continue  statin.  #5 end-stage renal disease on hemodialysis patient to receive dialysis tomorrow.  #6 hypotension  #7 type 2 diabetes patient carries a history of diabetes.  But she is not taking any medications at home or here.  Her blood sugar this morning was 65 which is improved to 80 and 105 after food intake.  Severity of Illness: The appropriate patient status for this patient is INPATIENT. Inpatient status is judged to be reasonable and necessary in order to provide the required intensity of service to ensure the patient's safety. The patient's presenting symptoms, physical exam findings, and initial radiographic and laboratory data in the context of their chronic comorbidities is felt to place them at high risk for further clinical deterioration. Furthermore, it is not anticipated that the patient will be medically stable for discharge from the hospital within 2 midnights of admission. The following factors support the patient status of inpatient.   " The patient's presenting symptoms include chest pain rapid atrial fibrillation. " The worrisome physical exam findings include paroxysmal atrial fibrillation and hypotension " The initial radiographic and laboratory data are worrisome because of paroxysmal A. fib subtherapeutic INR of 1.10 " The chronic co-morbidities include end-stage renal disease on dialysis, paroxysmal atrial fibrillation, hypotension, type 2 diabetes, CAD, recent right hip replacement    I certify that at the point of admission it is my clinical judgment that the patient will require inpatient hospital care spanning beyond 2 midnights from the point of admission due to high intensity of service, high risk for further deterioration and high frequency of surveillance required.  This patient needs INR therapeutic prior to discharge.  She is on heparin and Coumadin.  She cannot be on any other oral agents or Lovenox due to dialysis.  She is at high risk for bleeding from any other  anticoagulation without monitoring.   Estimated body mass index is 37.27 kg/m as calculated from the following:   Height as of this encounter: 5\' 5"  (1.651 m).   Weight as of this encounter: 101.6 kg.  DVT prophylaxis: Heparin and Coumadin Code Status: Full code Family Communication: No family available at this time Disposition Plan: Await INR to be therapeutic prior to discharge Consultants:  Nephrology Procedures: None Antimicrobials: None  Subjective: Resting in bed complains of right lower extremity pain and discomfort denies chest pain shortness of breath or palpitation   Objective: Vitals:   11/04/18 1846 11/05/18 0658 11/05/18 1100 11/05/18 1220  BP: (!) 91/54 112/64 (!) 96/56 (!) 90/49  Pulse:  85 77 77  Resp: 18 11 12    Temp: 97.7 F (36.5 C)   98.9 F (37.2 C)  TempSrc: Oral   Oral  SpO2:  100% 98% 97%  Weight:      Height:        Intake/Output Summary (Last 24 hours) at 11/05/2018 1430 Last data filed at 11/05/2018 1056 Gross per 24 hour  Intake 283 ml  Output 2264 ml  Net -1981 ml   Filed Weights   11/03/18 1515 11/04/18 1400 11/04/18 1805  Weight: 102.1 kg 103.4 kg 101.6 kg    Examination:  General exam: Appears calm and comfortable  Respiratory system: Clear to auscultation.  Respiratory effort normal. Cardiovascular system: S1 & S2 heard, RRR. No JVD, murmurs, rubs, gallops or clicks. No pedal edema. Gastrointestinal system: Abdomen is nondistended, soft and nontender. No organomegaly or masses felt. Normal bowel sounds heard. Central nervous system: Alert and oriented. No focal neurological deficits. Extremities trace lower extremity edema right more than left Skin: No rashes, lesions or ulcers Psychiatry: Judgement and insight appear normal. Mood & affect appropriate.     Data Reviewed: I have personally reviewed following labs and imaging studies  CBC: Recent Labs  Lab 11/03/18 1529 11/04/18 0216 11/05/18 0533  WBC 9.2 7.4 7.1  HGB  12.2 11.0* 11.8*  HCT 39.3 36.4 37.4  MCV 101.6* 100.8* 98.7  PLT 197 166 488   Basic Metabolic Panel: Recent Labs  Lab 11/03/18 1529 11/04/18 0216 11/04/18 0827  NA 137 133*  --   K 4.1 5.2*  --   CL 94* 94*  --   CO2 25 21*  --   GLUCOSE 93 126*  --   BUN 39* 48*  --   CREATININE 8.81* 9.22*  --   CALCIUM 9.1 8.5*  --   MG  --   --  2.1  PHOS  --  6.7*  --    GFR: Estimated Creatinine Clearance: 7.6 mL/min (A) (by C-G formula based on SCr of 9.22 mg/dL (H)). Liver Function Tests: Recent Labs  Lab 11/04/18 0216  ALBUMIN 3.2*   No results for input(s): LIPASE, AMYLASE in the last 168 hours. No results for input(s): AMMONIA in the last 168 hours. Coagulation Profile: Recent Labs  Lab 11/05/18 0533  INR 1.10   Cardiac Enzymes: Recent Labs  Lab 11/03/18 2241 11/04/18 0216 11/04/18 0827  TROPONINI 0.08* 0.09* 0.08*   BNP (last 3 results) No results for input(s): PROBNP in the last 8760 hours. HbA1C: No results for input(s): HGBA1C in the last 72 hours. CBG: Recent Labs  Lab 11/05/18 0818 11/05/18 0849 11/05/18 1153  GLUCAP 65* 80 105*   Lipid Profile: No results for input(s): CHOL, HDL, LDLCALC, TRIG, CHOLHDL, LDLDIRECT in the last 72 hours. Thyroid Function Tests: Recent Labs    11/03/18 2241  TSH 0.497   Anemia Panel: No results for input(s): VITAMINB12, FOLATE, FERRITIN, TIBC, IRON, RETICCTPCT in the last 72 hours. Sepsis Labs: No results for input(s): PROCALCITON, LATICACIDVEN in the last 168 hours.  Recent Results (from the past 240 hour(s))  MRSA PCR Screening     Status: None   Collection Time: 11/04/18  6:57 PM  Result Value Ref Range Status   MRSA by PCR NEGATIVE NEGATIVE Final    Comment:        The GeneXpert MRSA Assay (FDA approved for NASAL specimens only), is one component of a comprehensive MRSA colonization surveillance program. It is not intended to diagnose MRSA infection nor to guide or monitor treatment for MRSA  infections. Performed at [facility], [place] [address] [place], [place] [address]          Radiology Studies: Ct Angio Chest Pe W/cm &/or Wo Cm  Result Date: 11/04/2018 CLINICAL DATA:  62 year old female with chest pain. Concern for pulmonary embolism. EXAM: CT ANGIOGRAPHY CHEST WITH CONTRAST TECHNIQUE: Multidetector CT imaging of the chest was performed using the standard protocol during bolus administration of intravenous contrast. Multiplanar CT image reconstructions and MIPs were obtained to evaluate the vascular anatomy. CONTRAST:  124mL ISOVUE-370 IOPAMIDOL (ISOVUE-370) INJECTION 76% COMPARISON:  Chest radiograph dated 11/03/2018 FINDINGS: Cardiovascular: Top-normal cardiac size. No  pericardial effusion. There is multi vessel coronary vascular calcification. Moderate atherosclerotic calcification of the thoracic aorta. No aneurysmal dilatation or evidence of dissection. Evaluation of the pulmonary arteries is very limited due to suboptimal opacification and timing of the contrast and poor visualization of the peripheral branches. No large or central pulmonary artery embolus identified. Mediastinum/Nodes: There is no hilar or mediastinal adenopathy. The esophagus and the thyroid gland are grossly unremarkable. No mediastinal fluid collection. Lungs/Pleura: There are minimal bibasilar atelectatic changes. No focal consolidation, pleural effusion, or pneumothorax. A 6 mm calcified left upper lobe granuloma. The central airways are patent. Upper Abdomen: Left adrenal thickening/hyperplasia. Moderate right renal atrophy. Low attenuating lesions arising from the right kidney measure up to 6 cm in the superior pole are not characterized but likely represent cysts. Musculoskeletal: Degenerative changes of the spine. Multilevel anterior bridging osteophyte most consistent with DISH. No acute osseous pathology. Review of the MIP images confirms the above findings. IMPRESSION: 1. No acute intrathoracic  pathology. No CT evidence of central pulmonary artery embolus. 2. Coronary vascular calcification and Aortic Atherosclerosis (ICD10-I70.0). Electronically Signed   By: Anner Crete M.D.   On: 11/04/2018 00:32   Dg Chest Port 1 View  Result Date: 11/03/2018 CLINICAL DATA:  Acute chest pain today. EXAM: PORTABLE CHEST 1 VIEW COMPARISON:  04/27/2014 and prior exams FINDINGS: UPPER limits normal heart size and pulmonary vascular congestion noted. Mild bibasilar atelectasis identified. There is no evidence of focal airspace disease, pulmonary edema, suspicious pulmonary nodule/mass, pleural effusion, or pneumothorax. No acute bony abnormalities are identified. IMPRESSION: UPPER limits normal heart size with pulmonary vascular congestion. Electronically Signed   By: Margarette Canada M.D.   On: 11/03/2018 16:40   Xr Hip Unilat W Or W/o Pelvis 2-3 Views Left  Result Date: 11/03/2018 Severe degenerative joint disease of her left hip secondary to avascular necrosis  Xr Hip Unilat W Or W/o Pelvis 2-3 Views Right  Result Date: 11/03/2018 Stable appearing right total hip replacement without complication.  Vas [place] Lower Extremity Venous (dvt) (only [facility])  Result Date: 11/04/2018  Lower Venous Study Indications: Tachycardia.  Performing Technologist: Abram Sander RVS  Examination Guidelines: A complete evaluation includes B-mode imaging, spectral Doppler, color Doppler, and power Doppler as needed of all accessible portions of each vessel. Bilateral testing is considered an integral part of a complete examination. Limited examinations for reoccurring indications may be performed as noted.  Right Venous Findings: +---------+---------------+---------+-----------+----------+--------------+          CompressibilityPhasicitySpontaneityPropertiesSummary        +---------+---------------+---------+-----------+----------+--------------+ CFV      None           No       No                                   +---------+---------------+---------+-----------+----------+--------------+ SFJ      Full                                                        +---------+---------------+---------+-----------+----------+--------------+ FV Prox  None                                                        +---------+---------------+---------+-----------+----------+--------------+  FV Mid   None                                                        +---------+---------------+---------+-----------+----------+--------------+ FV DistalNone                                                        +---------+---------------+---------+-----------+----------+--------------+ PFV      Full                                                        +---------+---------------+---------+-----------+----------+--------------+ POP      None           No       No                                  +---------+---------------+---------+-----------+----------+--------------+ PTV      None                                                        +---------+---------------+---------+-----------+----------+--------------+ PERO                                                  not visualized +---------+---------------+---------+-----------+----------+--------------+  Left Venous Findings: +---------+---------------+---------+-----------+----------+-------+          CompressibilityPhasicitySpontaneityPropertiesSummary +---------+---------------+---------+-----------+----------+-------+ CFV      Full           Yes      Yes                          +---------+---------------+---------+-----------+----------+-------+ SFJ      Full                                                 +---------+---------------+---------+-----------+----------+-------+ FV Prox  Full                                                 +---------+---------------+---------+-----------+----------+-------+ FV Mid    Full                                                 +---------+---------------+---------+-----------+----------+-------+ FV DistalFull                                                 +---------+---------------+---------+-----------+----------+-------+  PFV      Full                                                 +---------+---------------+---------+-----------+----------+-------+ POP      Full           Yes      Yes                          +---------+---------------+---------+-----------+----------+-------+ PTV      Full                                                 +---------+---------------+---------+-----------+----------+-------+ PERO     Full                                                 +---------+---------------+---------+-----------+----------+-------+    Summary: Right: Findings consistent with acute deep vein thrombosis involving the right common femoral vein, right femoral vein, right popliteal vein, and right posterior tibial vein. Left: There is no evidence of deep vein thrombosis in the lower extremity. No cystic structure found in the popliteal fossa.  *See table(s) above for measurements and observations. Electronically signed by Quay Burow MD on 11/04/2018 at 4:56:11 PM.    Final         Scheduled Meds: . atorvastatin  40 mg Oral q1800  . [START ON 11/06/2018] calcitRIOL  1.5 mcg Oral Once per day on Mon Wed Fri  . Chlorhexidine Gluconate Cloth  6 each Topical Q0600  . gabapentin  100 mg Oral QHS  . multivitamin  1 tablet Oral QHS  . sodium chloride flush  3 mL Intravenous Q12H  . sucroferric oxyhydroxide  1,000 mg Oral TID WC  . warfarin  7.5 mg Oral ONCE-1800  . Warfarin - Pharmacist Dosing Inpatient   Does not apply q1800   Continuous Infusions: . diltiazem (CARDIZEM) infusion Stopped (11/04/18 0207)  . heparin 1,250 Units/hr (11/05/18 1245)     LOS: 0 days      Georgette Shell, MD [facility]  If 7PM-7AM,  please contact night-coverage [email or web address] Password [email or web address] 11/05/2018, 2:30 PM

## 2018-11-05 NOTE — Progress Notes (Signed)
[facility] Progress Note   Subjective:  Seen in room. No new complaints, s/p echo this morning - results pending. Has been started on warfarin for DVT.  Objective Vitals:   11/04/18 1805 11/04/18 1846 11/05/18 0658 11/05/18 1100  BP: (!) 94/47 (!) 91/54 112/64 (!) 96/56  Pulse: 76  85 77  Resp: 18 18 11 12   Temp: 98 F (36.7 C) 97.7 F (36.5 C)    TempSrc: Oral Oral    SpO2: 100%  100% 98%  Weight: 101.6 kg     Height:       Physical Exam General: Well appearing woman, NAD Heart: RRR; no murmur Lungs: CTA anteriorly Abdomen: soft, non-tender Extremities: No LE edema Dialysis Access: AVF + thrill  Additional Objective Labs: Basic Metabolic Panel: Recent Labs  Lab 11/03/18 1529 11/04/18 0216  NA 137 133*  K 4.1 5.2*  CL 94* 94*  CO2 25 21*  GLUCOSE 93 126*  BUN 39* 48*  CREATININE 8.81* 9.22*  CALCIUM 9.1 8.5*  PHOS  --  6.7*   Liver Function Tests: Recent Labs  Lab 11/04/18 0216  ALBUMIN 3.2*   CBC: Recent Labs  Lab 11/03/18 1529 11/04/18 0216 11/05/18 0533  WBC 9.2 7.4 7.1  HGB 12.2 11.0* 11.8*  HCT 39.3 36.4 37.4  MCV 101.6* 100.8* 98.7  PLT 197 166 150   Blood Culture    Component Value Date/Time   SDES URINE, RANDOM 11/05/2012 2355   SPECREQUEST NONE 11/05/2012 2355   CULT NO GROWTH 11/05/2012 2355   REPTSTATUS 11/07/2012 FINAL 11/05/2012 2355    Cardiac Enzymes: Recent Labs  Lab 11/03/18 2241 11/04/18 0216 11/04/18 0827  TROPONINI 0.08* 0.09* 0.08*   CBG: Recent Labs  Lab 11/05/18 0818 11/05/18 0849 11/05/18 1153  GLUCAP 65* 80 105*   Studies/Results: Ct Angio Chest Pe W/cm &/or Wo Cm  Result Date: 11/04/2018 CLINICAL DATA:  62 year old female with chest pain. Concern for pulmonary embolism. EXAM: CT ANGIOGRAPHY CHEST WITH CONTRAST TECHNIQUE: Multidetector CT imaging of the chest was performed using the standard protocol during bolus administration of intravenous contrast. Multiplanar CT image  reconstructions and MIPs were obtained to evaluate the vascular anatomy. CONTRAST:  167mL ISOVUE-370 IOPAMIDOL (ISOVUE-370) INJECTION 76% COMPARISON:  Chest radiograph dated 11/03/2018 FINDINGS: Cardiovascular: Top-normal cardiac size. No pericardial effusion. There is multi vessel coronary vascular calcification. Moderate atherosclerotic calcification of the thoracic aorta. No aneurysmal dilatation or evidence of dissection. Evaluation of the pulmonary arteries is very limited due to suboptimal opacification and timing of the contrast and poor visualization of the peripheral branches. No large or central pulmonary artery embolus identified. Mediastinum/Nodes: There is no hilar or mediastinal adenopathy. The esophagus and the thyroid gland are grossly unremarkable. No mediastinal fluid collection. Lungs/Pleura: There are minimal bibasilar atelectatic changes. No focal consolidation, pleural effusion, or pneumothorax. A 6 mm calcified left upper lobe granuloma. The central airways are patent. Upper Abdomen: Left adrenal thickening/hyperplasia. Moderate right renal atrophy. Low attenuating lesions arising from the right kidney measure up to 6 cm in the superior pole are not characterized but likely represent cysts. Musculoskeletal: Degenerative changes of the spine. Multilevel anterior bridging osteophyte most consistent with DISH. No acute osseous pathology. Review of the MIP images confirms the above findings. IMPRESSION: 1. No acute intrathoracic pathology. No CT evidence of central pulmonary artery embolus. 2. Coronary vascular calcification and Aortic Atherosclerosis (ICD10-I70.0). Electronically Signed   By: Anner Crete M.D.   On: 11/04/2018 00:32   Dg Chest [facility] [address]  Result Date: 11/03/2018 CLINICAL DATA:  Acute chest pain today. EXAM: PORTABLE CHEST 1 VIEW COMPARISON:  04/27/2014 and prior exams FINDINGS: UPPER limits normal heart size and pulmonary vascular congestion noted. Mild bibasilar  atelectasis identified. There is no evidence of focal airspace disease, pulmonary edema, suspicious pulmonary nodule/mass, pleural effusion, or pneumothorax. No acute bony abnormalities are identified. IMPRESSION: UPPER limits normal heart size with pulmonary vascular congestion. Electronically Signed   By: Margarette Canada M.D.   On: 11/03/2018 16:40   Xr Hip Unilat W Or W/o Pelvis 2-3 Views Left  Result Date: 11/03/2018 Severe degenerative joint disease of her left hip secondary to avascular necrosis  Xr Hip Unilat W Or W/o Pelvis 2-3 Views Right  Result Date: 11/03/2018 Stable appearing right total hip replacement without complication.  Vas [place] Lower Extremity Venous (dvt) (only [facility])  Result Date: 11/04/2018  Lower Venous Study Indications: Tachycardia.  Performing Technologist: Abram Sander RVS  Examination Guidelines: A complete evaluation includes B-mode imaging, spectral Doppler, color Doppler, and power Doppler as needed of all accessible portions of each vessel. Bilateral testing is considered an integral part of a complete examination. Limited examinations for reoccurring indications may be performed as noted.  Right Venous Findings: +---------+---------------+---------+-----------+----------+--------------+          CompressibilityPhasicitySpontaneityPropertiesSummary        +---------+---------------+---------+-----------+----------+--------------+ CFV      None           No       No                                  +---------+---------------+---------+-----------+----------+--------------+ SFJ      Full                                                        +---------+---------------+---------+-----------+----------+--------------+ FV Prox  None                                                        +---------+---------------+---------+-----------+----------+--------------+ FV Mid   None                                                         +---------+---------------+---------+-----------+----------+--------------+ FV DistalNone                                                        +---------+---------------+---------+-----------+----------+--------------+ PFV      Full                                                        +---------+---------------+---------+-----------+----------+--------------+ POP  None           No       No                                  +---------+---------------+---------+-----------+----------+--------------+ PTV      None                                                        +---------+---------------+---------+-----------+----------+--------------+ PERO                                                  not visualized +---------+---------------+---------+-----------+----------+--------------+  Left Venous Findings: +---------+---------------+---------+-----------+----------+-------+          CompressibilityPhasicitySpontaneityPropertiesSummary +---------+---------------+---------+-----------+----------+-------+ CFV      Full           Yes      Yes                          +---------+---------------+---------+-----------+----------+-------+ SFJ      Full                                                 +---------+---------------+---------+-----------+----------+-------+ FV Prox  Full                                                 +---------+---------------+---------+-----------+----------+-------+ FV Mid   Full                                                 +---------+---------------+---------+-----------+----------+-------+ FV DistalFull                                                 +---------+---------------+---------+-----------+----------+-------+ PFV      Full                                                 +---------+---------------+---------+-----------+----------+-------+ POP      Full           Yes      Yes                           +---------+---------------+---------+-----------+----------+-------+ PTV      Full                                                 +---------+---------------+---------+-----------+----------+-------+  PERO     Full                                                 +---------+---------------+---------+-----------+----------+-------+    Summary: Right: Findings consistent with acute deep vein thrombosis involving the right common femoral vein, right femoral vein, right popliteal vein, and right posterior tibial vein. Left: There is no evidence of deep vein thrombosis in the lower extremity. No cystic structure found in the popliteal fossa.  *See table(s) above for measurements and observations. Electronically signed by Quay Burow MD on 11/04/2018 at 4:56:11 PM.    Final    Medications: . diltiazem (CARDIZEM) infusion Stopped (11/04/18 0207)  . heparin 1,250 Units/hr (11/04/18 1657)   . Chlorhexidine Gluconate Cloth  6 each Topical Q0600  . gabapentin  100 mg Oral QHS  . multivitamin  1 tablet Oral QHS  . sodium chloride flush  3 mL Intravenous Q12H  . sucroferric oxyhydroxide  1,000 mg Oral TID WC  . warfarin  7.5 mg Oral ONCE-1800  . Warfarin - Pharmacist Dosing Inpatient   Does not apply q1800   Dialysis Orders: MWF at [facility] 4hr, 450/800, 2K/2Ca, EDW 101.5kg, L AVF, heparin 8000 + 3000 mid-run bolus - Calcitriol 1.64mcg PO q HD - Mircera 33mcg IV q 2 weeks (last 11/26) - Parsabiv 12.5mg  IV q HD - Binders: Velphoro 2/meals.  Assessment/Plan: 1.  Acute R DVT: Hx recent surgery, no symptoms. CT angio negative. On heparin drip/warfarin. 2.  Chest pain (now resolved): Echo pending. Per primary. 3.  A-fib RVR (on admit): Hx paroxysmal A-fib in past. Took warfarin for while, off since 2018.  4.  ESRD: Will continue HD per MWF schedule, for HD 12/6. 5.  Hypertension/volume: BP low on admit, improved slightly now. No edema.  6.  Anemia: Hgb 11.8, not due for ESA yet 7.   Metabolic bone disease: Ca ok, Phos high - continue Velphoro as binder. 8.  Type 2 DM  Brenda Penton, PA-C 11/05/2018, 12:19 PM  [facility] Pager: [phone]

## 2018-11-05 NOTE — Progress Notes (Signed)
Patient had a small nose bleed. MD notified, said it was okay to go ahead and give scheduled coumadin and continue heparin. Will continue to monitor.

## 2018-11-05 NOTE — Progress Notes (Addendum)
[facility] for Heparin and Warfarin  Indication:  VTE treatment (new bilateral DVTs), afib with RVR  Patient Measurements: Height: 5\' 5"  (165.1 cm) Weight: 223 lb 15.8 oz (101.6 kg) IBW/kg (Calculated) : 57 Heparin Dosing Weight: 80 kg   Vital Signs:    Labs: Recent Labs    11/03/18 1529 11/03/18 2241 11/04/18 0216  11/04/18 0827 11/04/18 1937 11/05/18 0533  HGB 12.2  --  11.0*  --   --   --  11.8*  HCT 39.3  --  36.4  --   --   --  37.4  PLT 197  --  166  --   --   --  150  LABPROT  --   --   --   --   --   --  14.1  INR  --   --   --   --   --   --  1.10  HEPARINUNFRC  --   --  1.24*   < > 0.24* 0.54 0.66  CREATININE 8.81*  --  9.22*  --   --   --   --   TROPONINI  --  0.08* 0.09*  --  0.08*  --   --    < > = values in this interval not displayed.    Assessment: 22 YOF who presented with chest pain. She has a h/o of Afib not on anticoagulation. Currently on Afib with RVR. She was started on a heparin drip yesterday. This morning's heparin level was therapeutic at 0.66. Patient started on warfarin yesterday for new DVT. No recent baseline INR noted.  INR today 1.10.   Due to patient's renal failure, enoxaparin would not be recommended due to abnormal kinetics and risk of bleeding. Would recommend continuing heparin drip until patient is therapeutic (INR 2-3).      Goal of Therapy:  INR = 2-3  Heparin level 0.3-0.7 units/ml Monitor platelets by anticoagulation protocol: Yes   Plan:  Give warfarin 7.5 mg po x1 tonight Heparin drip to continue at  1250 units/hr -Daily HL, INR and CBC   Exie Chrismer A. Levada Dy, PharmD, [place] Pager: [phone] Please utilize Amion for appropriate phone number to reach the unit pharmacist ([place])   11/05/2018 7:48 AM

## 2018-11-05 NOTE — Progress Notes (Signed)
  Echocardiogram 2D Echocardiogram has been performed.  Brenda Arnold 11/05/2018, 9:43 AM

## 2018-11-05 NOTE — Care Management (Signed)
#   4.   S/W  JOSIE   @ [facility]  RX # [phone]   1. LOVENOX SYRINGES  100 MG BID COVER-  NOT COVER AND AND NONE FORMULARY PRIOR APPROVAL- YES # [phone]   2. ENOXAPARIN SYRINGES  100 MG BID COVER- YES CO-PAY- ZERO DOLLARS TIER- NO PRIOR APPROVAL- NO  PREFERRED PHARMACY : YES  -  [facility]

## 2018-11-05 NOTE — Progress Notes (Signed)
Benefits check in process for Lovenox 100mg  twice a day. Whitman Hero RN,CM

## 2018-11-06 LAB — CBC
HCT: 35.5 % — ABNORMAL LOW (ref 36.0–46.0)
HCT: 38.4 % (ref 36.0–46.0)
Hemoglobin: 11.1 g/dL — ABNORMAL LOW (ref 12.0–15.0)
Hemoglobin: 12 g/dL (ref 12.0–15.0)
MCH: 30.6 pg (ref 26.0–34.0)
MCH: 30.7 pg (ref 26.0–34.0)
MCHC: 31.3 g/dL (ref 30.0–36.0)
MCHC: 31.3 g/dL (ref 30.0–36.0)
MCV: 97.8 fL (ref 80.0–100.0)
MCV: 98.2 fL (ref 80.0–100.0)
Platelets: 163 10*3/uL (ref 150–400)
Platelets: 178 10*3/uL (ref 150–400)
RBC: 3.63 MIL/uL — ABNORMAL LOW (ref 3.87–5.11)
RBC: 3.91 MIL/uL (ref 3.87–5.11)
RDW: 15.2 % (ref 11.5–15.5)
RDW: 15.3 % (ref 11.5–15.5)
WBC: 6.5 10*3/uL (ref 4.0–10.5)
WBC: 8.3 10*3/uL (ref 4.0–10.5)
nRBC: 0 % (ref 0.0–0.2)
nRBC: 0 % (ref 0.0–0.2)

## 2018-11-06 LAB — RENAL FUNCTION PANEL
Albumin: 3.5 g/dL (ref 3.5–5.0)
Anion gap: 16 — ABNORMAL HIGH (ref 5–15)
BUN: 39 mg/dL — ABNORMAL HIGH (ref 8–23)
CO2: 26 mmol/L (ref 22–32)
Calcium: 9.2 mg/dL (ref 8.9–10.3)
Chloride: 94 mmol/L — ABNORMAL LOW (ref 98–111)
Creatinine, Ser: 9.17 mg/dL — ABNORMAL HIGH (ref 0.44–1.00)
GFR calc Af Amer: 5 mL/min — ABNORMAL LOW (ref >60)
GFR calc non Af Amer: 4 mL/min — ABNORMAL LOW (ref >60)
Glucose, Bld: 78 mg/dL (ref 70–99)
Phosphorus: 5.7 mg/dL — ABNORMAL HIGH (ref 2.5–4.6)
Potassium: 4.4 mmol/L (ref 3.5–5.1)
Sodium: 136 mmol/L (ref 135–145)

## 2018-11-06 LAB — GLUCOSE, CAPILLARY
Glucose-Capillary: 62 mg/dL — ABNORMAL LOW (ref 70–99)
Glucose-Capillary: 87 mg/dL (ref 70–99)
Glucose-Capillary: 74 mg/dL (ref 70–99)

## 2018-11-06 LAB — HEPARIN LEVEL (UNFRACTIONATED): Heparin Unfractionated: 0.56 IU/mL (ref 0.30–0.70)

## 2018-11-06 LAB — PROTIME-INR
INR: 1.2
Prothrombin Time: 15.1 seconds (ref 11.4–15.2)

## 2018-11-06 MED ORDER — WARFARIN SODIUM 7.5 MG PO TABS: 7.5000 mg | ORAL_TABLET | Freq: Once | ORAL | Status: DC

## 2018-11-06 MED ORDER — HEPARIN SODIUM (PORCINE) 1000 UNIT/ML DIALYSIS: 20.0000 [IU]/kg | INTRAMUSCULAR | Status: DC | PRN

## 2018-11-06 MED ORDER — WARFARIN SODIUM 5 MG PO TABS
10.0000 mg | ORAL_TABLET | Freq: Once | ORAL | Status: AC
  Administered 2018-11-06: 10 mg via ORAL
  Filled 2018-11-06: qty 2

## 2018-11-06 NOTE — Progress Notes (Signed)
PROGRESS NOTE    Brenda Arnold  GLO:[id number] DOB: [birth year] DOA: 11/03/2018 PCP: Kelton Pillar, MD   Brief Narrative: 62 y.o.femalewith medical history significant of ESRD on HD, PAF, HTN, HLD, DM type 2, and hypotension; who presents with acute onset chest pain. She had undergone an elective right hip replacement by Dr. Erlinda Hong on 10/15, and was placed on 81 mg of aspirin following the procedure. Patient reports taking this medication as advised and had been doing well. She had gone for a follow-up appointment today, and to also discuss left hip replacement. During the appointment she reports having acute onset of substernal burning pain with chest tightness making it difficult for her to breathe. She initially thought symptoms may be related to the reflux, but symptoms did not improve. She was advised to go to the emergency department. She had shorter episodes once or twice before last week or so. Denies having any leg swelling, calf pain, loss of consciousness, previous blood clots, fever, chills, or diaphoresis. Other associated symptoms include some left hip pain and palpitation. Patient states that she was previously on Coumadin for many years, but had an episode in February 2018 where she had prolonged epistaxis due to INR being elevated to 3.8. Thereafter, patient reports that her primary care provider took her off the Coumadin approximately a year ago. She denies any other issues with bleeding. Last episode of atrial fibrillation was over year ago. She last had a full hemodialysis session on Monday and is due to have a hemodialysis tomorrow. For the last 6 to 8 months she states that she has been having issues with low blood pressures normally running in the 80s over 40s. ED Course:Upon admission into the emergency department patient was noted to be patient was noted to be in A. fib with RVR with heart rates into the 160s with blood pressures as low as 66/49. Due to patient  previously not being on anticoagulation cardioversion was contraindicated. Patient received 200 mg of hydrocortisone, and 250 mL bolus of normal saline IV fluids for blood pressures. Initial troponin 0. Patient had been started on Cardizem drip. Vascular Doppler ultrasound of lower extremities revealed acute DVT of the right common femoral, popliteal, posterior tibial vein. Patient was started on heparin drip. Critical care was consulted as well as cardiology due to patient's symptoms. Patient did return to sinus rhythm. CT angiogram of the chest was pending due to reported iodine allergy.. Patient was pretreated with Benadryl. TRH called to admit. Blood pressure still noted to be low but mildly improved last noted to be 93/54. Patient reported having one episode of nausea in the ED, but currently reports being chest pain-free  Assessment & Plan:   Principal Problem:   Chest pain Active Problems:   Paroxysmal atrial fibrillation (HCC)   DM (diabetes mellitus) ([place])   DVT (deep venous thrombosis) (HCC)   Hypotension   ESRD on hemodialysis (HCC)   CAD (coronary artery disease)   Atrial fibrillation with RVR ([place])   A-fib (HCC)   #1 A. fib RVR-patient with history of paroxysmal atrial fibrillation.  She is on heparin and Coumadin at this time.  Her INR is 1.2.  She is to get 10 mg tonight. Since she is a dialysis patient and at high risk for bleeding she cannot be on Lovenox or other oral agents.  Appreciate pharmacy input.  Patient concerned about bleeding on while on anticoagulation.  She reports she was on Coumadin in the past when her INR was only  in the threes she had epistaxis.  Patient had an episode of controlled epistaxis yesterday which has been resolved.  #2 right hip replacement October 2019 now with right lower extremity acute DVT-continue anticoagulation as above.  Patient does not have a PE.  She is certainly at high risk for getting a PE with subtherapeutic INR.  Continue  Coumadin and heparin.  #3 chest pain resolved once A. fib RVR resolved.  However patient has CAD and three-vessel coronary calcification by CT scan of the chest.  Patient to follow-up with cardiology as an outpatient.  #4 hyperlipidemia continue statin.  #5 end-stage renal disease on hemodialysis patient to receive dialysis tomorrow.  #6 hypotension improved not on any antihypertensives.  #7 type 2 diabetes patient carries a history of diabetes.  But she is not taking any medications at home or here.  Her blood sugars have been lower in the morning in the 60s.    Severity of Illness: The appropriate patient status for this patient is INPATIENT. Inpatient status is judged to be reasonable and necessary in order to provide the required intensity of service to ensure the patient's safety. The patient's presenting symptoms, physical exam findings, and initial radiographic and laboratory data in the context of their chronic comorbidities is felt to place them at high risk for further clinical deterioration. Furthermore, it is not anticipated that the patient will be medically stable for discharge from the hospital within 2 midnights of admission. The following factors support the patient status of inpatient.   "           The patient's presenting symptoms include chest pain rapid atrial fibrillation. "           The worrisome physical exam findings include paroxysmal atrial fibrillation and hypotension "           The initial radiographic and laboratory data are worrisome because of paroxysmal A. fib subtherapeutic INR of 1.10 "           The chronic co-morbidities include end-stage renal disease on dialysis, paroxysmal atrial fibrillation, hypotension, type 2 diabetes, CAD, recent right hip replacement     Estimated body mass index is 37.27 kg/m as calculated from the following:   Height as of this encounter: 5\' 5"  (1.651 m).   Weight as of this encounter: 101.6 kg.  DVT  prophylaxis: Code Status: Family Communication:  Disposition Plan:  Consultants:    Procedures: Antimicrobials:  Subjective:   Objective: Vitals:   11/05/18 1220 11/05/18 1500 11/05/18 2120 11/06/18 0629  BP: (!) 90/49 100/62 (!) 106/52 96/61  Pulse: 77  81 78  Resp:  16 18 19   Temp: 98.9 F (37.2 C)  97.7 F (36.5 C) 97.7 F (36.5 C)  TempSrc: Oral  Oral Oral  SpO2: 97% 97% 100% 96%  Weight:      Height:       No intake or output data in the 24 hours ending 11/06/18 1212 Filed Weights   11/03/18 1515 11/04/18 1400 11/04/18 1805  Weight: 102.1 kg 103.4 kg 101.6 kg    Examination:  General exam: Appears calm and comfortable  Respiratory system: Clear to auscultation. Respiratory effort normal. Cardiovascular system: S1 & S2 heard, RRR. No JVD, murmurs, rubs, gallops or clicks. No pedal edema. Gastrointestinal system: Abdomen is nondistended, soft and nontender. No organomegaly or masses felt. Normal bowel sounds heard. Central nervous system: Alert and oriented. No focal neurological deficits. Extremities: Symmetric 5 x 5 power. Skin: No rashes, lesions or  ulcers Psychiatry: Judgement and insight appear normal. Mood & affect appropriate.     Data Reviewed: I have personally reviewed following labs and imaging studies  CBC: Recent Labs  Lab 11/03/18 1529 11/04/18 0216 11/05/18 0533 11/06/18 0349 11/06/18 1026  WBC 9.2 7.4 7.1 6.5 8.3  HGB 12.2 11.0* 11.8* 11.1* 12.0  HCT 39.3 36.4 37.4 35.5* 38.4  MCV 101.6* 100.8* 98.7 97.8 98.2  PLT 197 166 150 163 878   Basic Metabolic Panel: Recent Labs  Lab 11/03/18 1529 11/04/18 0216 11/04/18 0827 11/06/18 1026  NA 137 133*  --  136  K 4.1 5.2*  --  4.4  CL 94* 94*  --  94*  CO2 25 21*  --  26  GLUCOSE 93 126*  --  78  BUN 39* 48*  --  39*  CREATININE 8.81* 9.22*  --  9.17*  CALCIUM 9.1 8.5*  --  9.2  MG  --   --  2.1  --   PHOS  --  6.7*  --  5.7*   GFR: Estimated Creatinine Clearance: 7.6 mL/min  (A) (by C-G formula based on SCr of 9.17 mg/dL (H)). Liver Function Tests: Recent Labs  Lab 11/04/18 0216 11/06/18 1026  ALBUMIN 3.2* 3.5   No results for input(s): LIPASE, AMYLASE in the last 168 hours. No results for input(s): AMMONIA in the last 168 hours. Coagulation Profile: Recent Labs  Lab 11/05/18 0533 11/06/18 0349  INR 1.10 1.20   Cardiac Enzymes: Recent Labs  Lab 11/03/18 2241 11/04/18 0216 11/04/18 0827  TROPONINI 0.08* 0.09* 0.08*   BNP (last 3 results) No results for input(s): PROBNP in the last 8760 hours. HbA1C: No results for input(s): HGBA1C in the last 72 hours. CBG: Recent Labs  Lab 11/05/18 1153 11/05/18 1711 11/06/18 0804 11/06/18 0901 11/06/18 1139  GLUCAP 105* 80 62* 87 74   Lipid Profile: No results for input(s): CHOL, HDL, LDLCALC, TRIG, CHOLHDL, LDLDIRECT in the last 72 hours. Thyroid Function Tests: Recent Labs    11/03/18 2241  TSH 0.497   Anemia Panel: No results for input(s): VITAMINB12, FOLATE, FERRITIN, TIBC, IRON, RETICCTPCT in the last 72 hours. Sepsis Labs: No results for input(s): PROCALCITON, LATICACIDVEN in the last 168 hours.  Recent Results (from the past 240 hour(s))  MRSA PCR Screening     Status: None   Collection Time: 11/04/18  6:57 PM  Result Value Ref Range Status   MRSA by PCR NEGATIVE NEGATIVE Final    Comment:        The GeneXpert MRSA Assay (FDA approved for NASAL specimens only), is one component of a comprehensive MRSA colonization surveillance program. It is not intended to diagnose MRSA infection nor to guide or monitor treatment for MRSA infections. Performed at [facility], [place] [address] [place],  [address]          Radiology Studies: No results found.      Scheduled Meds: . atorvastatin  40 mg Oral q1800  . calcitRIOL  1.5 mcg Oral Once per day on Mon Wed Fri  . Chlorhexidine Gluconate Cloth  6 each Topical Q0600  . gabapentin  100 mg Oral QHS  .  multivitamin  1 tablet Oral QHS  . sodium chloride flush  3 mL Intravenous Q12H  . sucroferric oxyhydroxide  1,000 mg Oral TID WC  . warfarin  7.5 mg Oral ONCE-1800  . Warfarin - Pharmacist Dosing Inpatient   Does not apply q1800   Continuous Infusions: .  diltiazem (CARDIZEM) infusion Stopped (11/04/18 0207)  . heparin 1,250 Units/hr (11/06/18 1111)     LOS: 1 day       Georgette Shell, MD [facility] Pager [phone]  If 7PM-7AM, please contact night-coverage [email or web address] Password [email or web address] 11/06/2018, 12:12 PM

## 2018-11-06 NOTE — Progress Notes (Signed)
Physical Therapy Treatment Patient Details Name: Brenda Arnold MRN: [id number] DOB: Mar 12, 1956 Today's Date: 11/06/2018    History of Present Illness Pt is a 62 y.o. female with medical history significant of ESRD on HD, PAF, HTN, HLD, DM type 2, and hypotension; who presents with acute onset chest pain.   Pt found to have afib with RVR, now resolved and R LE DVT which is being treated.    PT Comments    Pt continues to demonstrate mildly antalgic gait pattern secondary to ongoing L hip pain. Pt reported that she is planning to have a hip replacement when medically cleared. She remains overall at mod I to supervision level with use of RW. PT will continue to follow acutely to progress mobility as tolerated.    Follow Up Recommendations  No PT follow up     Equipment Recommendations  None recommended by PT    Recommendations for Other Services       Precautions / Restrictions Precautions Precautions: None Restrictions Weight Bearing Restrictions: No    Mobility  Bed Mobility Overal bed mobility: Modified Independent                Transfers Overall transfer level: Needs assistance Equipment used: Rolling walker (2 wheeled) Transfers: Sit to/from Stand Sit to Stand: Supervision         General transfer comment: supervision for safety, increased time  Ambulation/Gait Ambulation/Gait assistance: Supervision Gait Distance (Feet): 200 Feet Assistive device: Rolling walker (2 wheeled) Gait Pattern/deviations: Step-through pattern Gait velocity: decreased   General Gait Details: mild antalgia secondary to L hip pain; however, very steady with RW   Stairs             Wheelchair Mobility    Modified Rankin (Stroke Patients Only)       Balance Overall balance assessment: Needs assistance Sitting-balance support: Feet supported Sitting balance-Leahy Scale: Good     Standing balance support: During functional activity Standing balance-Leahy  Scale: Fair                              Cognition Arousal/Alertness: Awake/alert Behavior During Therapy: WFL for tasks assessed/performed Overall Cognitive Status: Within Functional Limits for tasks assessed                                        Exercises      General Comments        Pertinent Vitals/Pain Pain Assessment: Faces Faces Pain Scale: Hurts little more Pain Location: L hip Pain Descriptors / Indicators: Aching Pain Intervention(s): Monitored during session;Repositioned    Home Living                      Prior Function            PT Goals (current goals can now be found in the care plan section) Acute Rehab PT Goals PT Goal Formulation: With patient Progress towards PT goals: Progressing toward goals    Frequency    Min 3X/week      PT Plan Current plan remains appropriate    Co-evaluation              AM-PAC PT "6 Clicks" Mobility   Outcome Measure  Help needed turning from your back to your side while in a flat bed without using bedrails?: None  Help needed moving from lying on your back to sitting on the side of a flat bed without using bedrails?: None Help needed moving to and from a bed to a chair (including a wheelchair)?: None Help needed standing up from a chair using your arms (e.g., wheelchair or bedside chair)?: None Help needed to walk in hospital room?: None Help needed climbing 3-5 steps with a railing? : A Little 6 Click Score: 23    End of Session   Activity Tolerance: Patient tolerated treatment well Patient left: in bed;with call bell/phone within reach Nurse Communication: Mobility status PT Visit Diagnosis: Other abnormalities of gait and mobility (R26.89)     Time: 5883-2549 PT Time Calculation (min) (ACUTE ONLY): 15 min  Charges:  $Gait Training: 8-22 mins                     Sherie Don, [place], DPT  Acute Rehabilitation Services Pager [phone] Office  [facility] 11/06/2018, 9:44 AM

## 2018-11-06 NOTE — Progress Notes (Addendum)
ANTICOAGULATION CONSULT NOTE  Pharmacy Consult for Heparin and Warfarin  Indication:  VTE treatment (new bilateral DVTs), afib with RVR  Patient Measurements: Height: 5\' 5"  (165.1 cm) Weight: 223 lb 15.8 oz (101.6 kg) IBW/kg (Calculated) : 57 Heparin Dosing Weight: 80 kg   Vital Signs: Temp: 97.7 F (36.5 C) (12/06 0629) Temp Source: Oral (12/06 0629) BP: 96/61 (12/06 0629) Pulse Rate: 78 (12/06 0629)  Labs: Recent Labs    11/03/18 1529 11/03/18 2241 11/04/18 0216  11/04/18 0827 11/04/18 1937 11/05/18 0533 11/06/18 0349  HGB 12.2  --  11.0*  --   --   --  11.8* 11.1*  HCT 39.3  --  36.4  --   --   --  37.4 35.5*  PLT 197  --  166  --   --   --  150 163  LABPROT  --   --   --   --   --   --  14.1 15.1  INR  --   --   --   --   --   --  1.10 1.20  HEPARINUNFRC  --   --  1.24*   < > 0.24* 0.54 0.66 0.56  CREATININE 8.81*  --  9.22*  --   --   --   --   --   TROPONINI  --  0.08* 0.09*  --  0.08*  --   --   --    < > = values in this interval not displayed.    Assessment: 36 YOF who presented with chest pain. She has a h/o of Afib not on anticoagulation. Currently on Afib with RVR. She was started on a heparin drip yesterday.   This morning's heparin level was therapeutic at 0.56. Patient started on warfarin 12/4 for new DVT. No recent baseline INR noted.  INR today 1.20 after 2 doses of 7.5mg .   Due to patient's renal failure, enoxaparin would not be recommended due to abnormal kinetics and risk of bleeding. Would recommend continuing heparin drip until patient is therapeutic (INR 2-3).      Goal of Therapy:  INR = 2-3  Heparin level 0.3-0.7 units/ml Monitor platelets by anticoagulation protocol: Yes   Plan:  Give warfarin 7.5 mg po x1 tonight Heparin drip to continue at  1250 units/hr -Daily HL, INR and CBC   Jaeley Wiker A. Levada Dy, PharmD, [place] Pager: [phone] Please utilize Amion for appropriate phone number to reach the unit  pharmacist ([place])  Addendum:  D/w Dr. Zigmund Daniel, Will give 10mg  tonight.   Wells Mabe A. Levada Dy, PharmD, [place] Pager: [phone] Please utilize Amion for appropriate phone number to reach the unit pharmacist ([place])     11/06/2018 7:34 AM

## 2018-11-06 NOTE — Progress Notes (Signed)
Notified pt's primary RN  Elisa, that HD treatment has been rescheduled for 11/07/2018 by Dr Posey Pronto

## 2018-11-06 NOTE — Progress Notes (Signed)
[facility] Progress Note   Dialysis Orders: MWF at [facility], 450/800, 2K/2Ca, EDW 101.5kg, L AVF, heparin 8000 + 3000 mid-run bolus - Calcitriol 1.24mcg PO q HD - Mircera 77mcg IV q 2 weeks (last 11/26) - Parsabiv 12.5mg  IV q HD - Binders: Velphoro 2/meals.  Assessment/Plan: 1. Acute R DVT - Hx recent surgery. No symptoms. On heparin/warfarin.  Will need to have provider for OP warfarin management set up prior to discharge, as this is not managed in OP dialysis centers.  2. Chest pain - resolved 3. A fib w/RVR - Hx A fib.  Off warfarin>59yr.  Restarted.  Per primary. 4. ESRD - on HD MWF. K 4.4.  HD today per regular schedule.  5. Anemia of CKD- Hgb 12.0. ESA recently dosed.  No indication for ESA at this time. 6. Secondary hyperparathyroidism - Ca 9.2, Phos 5.7. Continue binders and VDRA.  Parsabiv not available in hospital.  7. Hypotension/volume - BP soft.  Does not appear volume overloaded on exam, titrate down as tolerated.  8. Nutrition - Renal diet w/fluid restrictions.  9. DMT2  Jen Mow, PA-C [facility]  Pager: [phone] 11/06/2018,11:12 AM  LOS: 1 day   Subjective:    Feeling well, sitting on bedside.  No complaints.  Ready to go home when she is able.   Objective Vitals:   11/05/18 1220 11/05/18 1500 11/05/18 2120 11/06/18 0629  BP: (!) 90/49 100/62 (!) 106/52 96/61  Pulse: 77  81 78  Resp:  16 18 19   Temp: 98.9 F (37.2 C)  97.7 F (36.5 C) 97.7 F (36.5 C)  TempSrc: Oral  Oral Oral  SpO2: 97% 97% 100% 96%  Weight:      Height:       Physical Exam General:NAD, obese female Heart:RRR, no mrg Lungs:CTAB Abdomen:soft, NTND Extremities:no LE edema Dialysis Access: LU AVF +b/t   Filed Weights   11/03/18 1515 11/04/18 1400 11/04/18 1805  Weight: 102.1 kg 103.4 kg 101.6 kg   No intake or output data in the 24 hours ending 11/06/18 1112  Additional Objective Labs: Basic Metabolic Panel: Recent Labs  Lab  11/03/18 1529 11/04/18 0216  NA 137 133*  K 4.1 5.2*  CL 94* 94*  CO2 25 21*  GLUCOSE 93 126*  BUN 39* 48*  CREATININE 8.81* 9.22*  CALCIUM 9.1 8.5*  PHOS  --  6.7*   Liver Function Tests: Recent Labs  Lab 11/04/18 0216  ALBUMIN 3.2*   CBC: Recent Labs  Lab 11/03/18 1529 11/04/18 0216 11/05/18 0533 11/06/18 0349  WBC 9.2 7.4 7.1 6.5  HGB 12.2 11.0* 11.8* 11.1*  HCT 39.3 36.4 37.4 35.5*  MCV 101.6* 100.8* 98.7 97.8  PLT 197 166 150 163   Blood Culture  Cardiac Enzymes: Recent Labs  Lab 11/03/18 2241 11/04/18 0216 11/04/18 0827  TROPONINI 0.08* 0.09* 0.08*   CBG: Recent Labs  Lab 11/05/18 0849 11/05/18 1153 11/05/18 1711 11/06/18 0804 11/06/18 0901  GLUCAP 80 105* 80 62* 87   Studies/Results: No results found.  Medications: . diltiazem (CARDIZEM) infusion Stopped (11/04/18 0207)  . heparin 1,250 Units/hr (11/06/18 1111)   . atorvastatin  40 mg Oral q1800  . calcitRIOL  1.5 mcg Oral Once per day on Mon Wed Fri  . Chlorhexidine Gluconate Cloth  6 each Topical Q0600  . gabapentin  100 mg Oral QHS  . multivitamin  1 tablet Oral QHS  . sodium chloride flush  3 mL Intravenous Q12H  . sucroferric oxyhydroxide  1,000 mg Oral TID WC  . warfarin  7.5 mg Oral ONCE-1800  . Warfarin - Pharmacist Dosing Inpatient   Does not apply [phone]

## 2018-11-06 NOTE — Progress Notes (Signed)
Hypoglycemic Event  CBG: 65  Treatment: Given 120 cc orange juice.   Symptoms: None.  Follow-up CBG: Time: 9234 CBG Result: 80  Possible Reasons for Event: Patient went longer than normal without food or drink.  Comments/MD notified: Rodena Piety notified. Said to check CBGs qAM.    Melonie [place]

## 2018-11-06 NOTE — Progress Notes (Signed)
Hypoglycemic Event  CBG: 62  Treatment: Given 120 cc grape juice.  Symptoms: None.  Follow-up CBG: Time: 0901 CBG Result: 87  Possible Reasons for Event: Patient did not eat breakfast yet.   Comments/MD notified: N/A    Brenda Arnold

## 2018-11-07 DIAGNOSIS — I4811 Longstanding persistent atrial fibrillation: Secondary | ICD-10-CM

## 2018-11-07 LAB — CBC
HCT: 34.8 % — ABNORMAL LOW (ref 36.0–46.0)
Hemoglobin: 10.9 g/dL — ABNORMAL LOW (ref 12.0–15.0)
MCH: 30.1 pg (ref 26.0–34.0)
MCHC: 31.3 g/dL (ref 30.0–36.0)
MCV: 96.1 fL (ref 80.0–100.0)
Platelets: 185 10*3/uL (ref 150–400)
RBC: 3.62 MIL/uL — ABNORMAL LOW (ref 3.87–5.11)
RDW: 14.8 % (ref 11.5–15.5)
WBC: 7.8 10*3/uL (ref 4.0–10.5)
nRBC: 0.3 % — ABNORMAL HIGH (ref 0.0–0.2)

## 2018-11-07 LAB — RENAL FUNCTION PANEL
Albumin: 3 g/dL — ABNORMAL LOW (ref 3.5–5.0)
Anion gap: 16 — ABNORMAL HIGH (ref 5–15)
BUN: 52 mg/dL — ABNORMAL HIGH (ref 8–23)
CO2: 23 mmol/L (ref 22–32)
Calcium: 8.7 mg/dL — ABNORMAL LOW (ref 8.9–10.3)
Chloride: 92 mmol/L — ABNORMAL LOW (ref 98–111)
Creatinine, Ser: 10.29 mg/dL — ABNORMAL HIGH (ref 0.44–1.00)
GFR calc Af Amer: 4 mL/min — ABNORMAL LOW (ref >60)
GFR calc non Af Amer: 4 mL/min — ABNORMAL LOW (ref >60)
Glucose, Bld: 75 mg/dL (ref 70–99)
Phosphorus: 5.4 mg/dL — ABNORMAL HIGH (ref 2.5–4.6)
Potassium: 4.1 mmol/L (ref 3.5–5.1)
Sodium: 131 mmol/L — ABNORMAL LOW (ref 135–145)

## 2018-11-07 LAB — HEPARIN LEVEL (UNFRACTIONATED): Heparin Unfractionated: 0.52 IU/mL (ref 0.30–0.70)

## 2018-11-07 LAB — GLUCOSE, CAPILLARY: Glucose-Capillary: 89 mg/dL (ref 70–99)

## 2018-11-07 LAB — PROTIME-INR
INR: 1.3
Prothrombin Time: 16.1 seconds — ABNORMAL HIGH (ref 11.4–15.2)

## 2018-11-07 MED ORDER — PENTAFLUOROPROP-TETRAFLUOROETH EX AERO
1.0000 "application " | INHALATION_SPRAY | CUTANEOUS | Status: DC | PRN
  Administered 2018-11-07: 1 via TOPICAL

## 2018-11-07 MED ORDER — WARFARIN SODIUM 5 MG PO TABS
10.0000 mg | ORAL_TABLET | Freq: Once | ORAL | Status: AC
  Administered 2018-11-07: 10 mg via ORAL
  Filled 2018-11-07: qty 1
  Filled 2018-11-07: qty 2

## 2018-11-07 MED ORDER — SODIUM CHLORIDE 0.9 % IV SOLN: 100.0000 mL | INTRAVENOUS | Status: DC | PRN

## 2018-11-07 NOTE — Procedures (Signed)
Patient seen on Hemodialysis. BP (!) 115/58 (BP Location: Right Arm)   Pulse 82   Temp 97.9 F (36.6 C) (Oral)   Resp 16   Ht 5\' 5"  (1.651 m)   Wt 101.6 kg   SpO2 99%   BMI 37.27 kg/m   QB 400, UF goal 3L Tolerating treatment without complaints at this time.  INR today is 1.3, on heparin gtt without problems.   Elmarie Shiley MD [facility]. Office # [phone] Pager # [phone] 9:26 AM

## 2018-11-07 NOTE — Progress Notes (Signed)
PROGRESS NOTE    Brenda Arnold  SWN:[id number] DOB: 1956-08-18 DOA: 11/03/2018 PCP: Kelton Pillar, MD  Brief Narrative:62 y.o.femalewith medical history significant of ESRD on HD, PAF, HTN, HLD, DM type 2, and hypotension; who presents with acute onset chest pain. She had undergone an elective right hip replacement by Dr. Erlinda Hong on 10/15, and was placed on 81 mg of aspirin following the procedure. Patient reports taking this medication as advised and had been doing well. She had gone for a follow-up appointment today, and to also discuss left hip replacement. During the appointment she reports having acute onset of substernal burning pain with chest tightness making it difficult for her to breathe. She initially thought symptoms may be related to the reflux, but symptoms did not improve. She was advised to go to the emergency department. She had shorter episodes once or twice before last week or so. Denies having any leg swelling, calf pain, loss of consciousness, previous blood clots, fever, chills, or diaphoresis. Other associated symptoms include some left hip pain and palpitation. Patient states that she was previously on Coumadin for many years, but had an episode in February 2018 where she had prolonged epistaxis due to INR being elevated to 3.8. Thereafter, patient reports that her primary care provider took her off the Coumadin approximately a year ago. She denies any other issues with bleeding. Last episode of atrial fibrillation was over year ago. She last had a full hemodialysis session on Monday and is due to have a hemodialysis tomorrow. For the last 6 to 8 months she states that she has been having issues with low blood pressures normally running in the 80s over 40s. ED Course:Upon admission into the emergency department patient was noted to be patient was noted to be in A. fib with RVR with heart rates into the 160s with blood pressures as low as 66/49. Due to patient  previously not being on anticoagulation cardioversion was contraindicated. Patient received 200 mg of hydrocortisone, and 250 mL bolus of normal saline IV fluids for blood pressures. Initial troponin 0. Patient had been started on Cardizem drip. Vascular Doppler ultrasound of lower extremities revealed acute DVT of the right common femoral, popliteal, posterior tibial vein. Patient was started on heparin drip. Critical care was consulted as well as cardiology due to patient's symptoms. Patient did return to sinus rhythm. CT angiogram of the chest was pending due to reported iodine allergy.. Patient was pretreated with Benadryl. TRH called to admit. Blood pressure still noted to be low but mildly improved last noted to be 93/54. Patient reported having one episode of nausea in the ED, but currently reports being chest pain-free  Assessment & Plan:   Principal Problem:   Chest pain Active Problems:   Paroxysmal atrial fibrillation (HCC)   DM (diabetes mellitus) ([place])   DVT (deep venous thrombosis) (HCC)   Hypotension   ESRD on hemodialysis (HCC)   CAD (coronary artery disease)   Atrial fibrillation with RVR ([place])   A-fib (HCC)  #1 A. fib RVR-patient with history of paroxysmal atrial fibrillation. She is on heparin and Coumadin at this time. Her INR is 1.3 She is to get 10 mg tonight.Since she is a dialysis patient and at high risk for bleeding she cannot be on Lovenox or other oral agents. Appreciate pharmacy input. Patient concerned about bleeding on while on anticoagulation. She reports she was on Coumadin in the past when her INR was only in the threes she had epistaxis.  Patient had an  episode of controlled epistaxis yesterday which has been resolved.  #2 right hip replacement October 2019 now with right lower extremity acute DVT-continue anticoagulation as above. Patient does not have a PE.  She is certainly at high risk for getting a PE with subtherapeutic INR.  Continue  Coumadin and heparin.  #3 chest pain resolved once A. fib RVR resolved. However patient has CAD and three-vessel coronary calcification by CT scan of the chest. Patient to follow-up with cardiology as an outpatient.  #4 hyperlipidemia continue statin.  #5 end-stage renal disease on hemodialysis patient to receive dialysis tomorrow.  #6 hypotension improved not on any antihypertensives.  #7 type 2 diabetes patient carries a history of diabetes. But she is not taking any medications at home or here.  Her blood sugars have been lower in the morning in the 60s.         Estimated body mass index is 37.68 kg/m as calculated from the following:   Height as of this encounter: 5\' 5"  (1.651 m).   Weight as of this encounter: 102.7 kg.  DVT prophylaxis: Heparin and Coumadin Code Status: Full code Family Communication: None Disposition Plan patient anxious to go home but INR is still subtherapeutic with a DVT in a dialysis patient.   Consultants: Renal  Procedures: None Antimicrobials: None  Subjective: Resting in bed no further nosebleed, denies chest pain shortness of breath nausea vomiting or diarrhea she is to have dialysis today.  Objective: Vitals:   11/07/18 1130 11/07/18 1200 11/07/18 1235 11/07/18 1316  BP: (!) 89/56 (!) 78/45 (!) 87/42 (!) 90/46  Pulse: 84 77 84 79  Resp: 16 16 14 18   Temp:   97.6 F (36.4 C) 97.7 F (36.5 C)  TempSrc:   Oral   SpO2:   98% 100%  Weight:   102.7 kg   Height:        Intake/Output Summary (Last 24 hours) at 11/07/2018 1342 Last data filed at 11/07/2018 1212 Gross per 24 hour  Intake 240 ml  Output 1044 ml  Net -804 ml   Filed Weights   11/04/18 1805 11/07/18 0740 11/07/18 1235  Weight: 101.6 kg 104.8 kg 102.7 kg    Examination:  General exam: Appears calm and comfortable  Respiratory system: Clear to auscultation. Respiratory effort normal. Cardiovascular system: S1 & S2 heard, RRR. No JVD, murmurs, rubs, gallops or  clicks. No pedal edema. Gastrointestinal system: Abdomen is nondistended, soft and nontender. No organomegaly or masses felt. Normal bowel sounds heard. Central nervous system: Alert and oriented. No focal neurological deficits. Extremities: 1+ right lower extremity pitting edema and trace left lower extremity edema Skin: No rashes, lesions or ulcers Psychiatry: Judgement and insight appear normal. Mood & affect appropriate.     Data Reviewed: I have personally reviewed following labs and imaging studies  CBC: Recent Labs  Lab 11/04/18 0216 11/05/18 0533 11/06/18 0349 11/06/18 1026 11/07/18 0435  WBC 7.4 7.1 6.5 8.3 7.8  HGB 11.0* 11.8* 11.1* 12.0 10.9*  HCT 36.4 37.4 35.5* 38.4 34.8*  MCV 100.8* 98.7 97.8 98.2 96.1  PLT 166 150 163 178 759   Basic Metabolic Panel: Recent Labs  Lab 11/03/18 1529 11/04/18 0216 11/04/18 0827 11/06/18 1026 11/07/18 0824  NA 137 133*  --  136 131*  K 4.1 5.2*  --  4.4 4.1  CL 94* 94*  --  94* 92*  CO2 25 21*  --  26 23  GLUCOSE 93 126*  --  78 75  BUN  39* 48*  --  39* 52*  CREATININE 8.81* 9.22*  --  9.17* 10.29*  CALCIUM 9.1 8.5*  --  9.2 8.7*  MG  --   --  2.1  --   --   PHOS  --  6.7*  --  5.7* 5.4*   GFR: Estimated Creatinine Clearance: 6.8 mL/min (A) (by C-G formula based on SCr of 10.29 mg/dL (H)). Liver Function Tests: Recent Labs  Lab 11/04/18 0216 11/06/18 1026 11/07/18 0824  ALBUMIN 3.2* 3.5 3.0*   No results for input(s): LIPASE, AMYLASE in the last 168 hours. No results for input(s): AMMONIA in the last 168 hours. Coagulation Profile: Recent Labs  Lab 11/05/18 0533 11/06/18 0349 11/07/18 0435  INR 1.10 1.20 1.30   Cardiac Enzymes: Recent Labs  Lab 11/03/18 2241 11/04/18 0216 11/04/18 0827  TROPONINI 0.08* 0.09* 0.08*   BNP (last 3 results) No results for input(s): PROBNP in the last 8760 hours. HbA1C: No results for input(s): HGBA1C in the last 72 hours. CBG: Recent Labs  Lab 11/05/18 1711  11/06/18 0804 11/06/18 0901 11/06/18 1139 11/07/18 1310  GLUCAP 80 62* 87 74 89   Lipid Profile: No results for input(s): CHOL, HDL, LDLCALC, TRIG, CHOLHDL, LDLDIRECT in the last 72 hours. Thyroid Function Tests: No results for input(s): TSH, T4TOTAL, FREET4, T3FREE, THYROIDAB in the last 72 hours. Anemia Panel: No results for input(s): VITAMINB12, FOLATE, FERRITIN, TIBC, IRON, RETICCTPCT in the last 72 hours. Sepsis Labs: No results for input(s): PROCALCITON, LATICACIDVEN in the last 168 hours.  Recent Results (from the past 240 hour(s))  MRSA PCR Screening     Status: None   Collection Time: 11/04/18  6:57 PM  Result Value Ref Range Status   MRSA by PCR NEGATIVE NEGATIVE Final    Comment:        The GeneXpert MRSA Assay (FDA approved for NASAL specimens only), is one component of a comprehensive MRSA colonization surveillance program. It is not intended to diagnose MRSA infection nor to guide or monitor treatment for MRSA infections. Performed at [facility], [place] [address] [place], [place] [address]          Radiology Studies: No results found.      Scheduled Meds: . atorvastatin  40 mg Oral q1800  . calcitRIOL  1.5 mcg Oral Once per day on Mon Wed Fri  . Chlorhexidine Gluconate Cloth  6 each Topical Q0600  . gabapentin  100 mg Oral QHS  . multivitamin  1 tablet Oral QHS  . sodium chloride flush  3 mL Intravenous Q12H  . sucroferric oxyhydroxide  1,000 mg Oral TID WC  . warfarin  10 mg Oral ONCE-1800  . Warfarin - Pharmacist Dosing Inpatient   Does not apply q1800   Continuous Infusions: . sodium chloride    . sodium chloride    . diltiazem (CARDIZEM) infusion Stopped (11/04/18 0207)  . heparin 1,250 Units/hr (11/07/18 1010)     LOS: 2 days     Georgette Shell, MD [facility]  If 7PM-7AM, please contact night-coverage [email or web address] Password [email or web address] 11/07/2018, 1:42 PM

## 2018-11-07 NOTE — Progress Notes (Signed)
[facility] for Heparin and Warfarin  Indication:  VTE treatment (new bilateral DVTs), afib with RVR  Patient Measurements: Height: 5\' 5"  (165.1 cm) Weight: 231 lb 0.7 oz (104.8 kg)(stand up scale) IBW/kg (Calculated) : 57 Heparin Dosing Weight: 80 kg   Vital Signs: Temp: 98 F (36.7 C) (12/07 0740) Temp Source: Oral (12/07 0740) BP: 87/44 (12/07 1100) Pulse Rate: 85 (12/07 1100)  Labs: Recent Labs    11/05/18 0533 11/06/18 0349 11/06/18 1026 11/07/18 0435 11/07/18 0824  HGB 11.8* 11.1* 12.0 10.9*  --   HCT 37.4 35.5* 38.4 34.8*  --   PLT 150 163 178 185  --   LABPROT 14.1 15.1  --  16.1*  --   INR 1.10 1.20  --  1.30  --   HEPARINUNFRC 0.66 0.56  --  0.52  --   CREATININE  --   --  9.17*  --  10.29*    Assessment: 46 YOF who presented with chest pain. She has a h/o of Afib not on anticoagulation. Currently on Afib with RVR. Pharmacy on board for Heparin and Warfarin dosing.   Heparin level this morning is therapeutic (HL 0.52, goal of 0.3-0.7). INR remains SUBtherapeutic and is trending up slowly (INR 1.3 << 1.2, goal of 2-3). Hgb/Hct low but stable - no bleeding noted. Will continue with higher warfarin dose today.   Due to patient's renal failure, enoxaparin would not be recommended due to abnormal kinetics and risk of bleeding. Would recommend continuing heparin drip until patient is therapeutic (INR 2-3).    Goal of Therapy:  INR 2-3 Heparin level 0.3-0.7 units/ml Monitor platelets by anticoagulation protocol: Yes   Plan:  - Continue Heparin at 1250 units/hr (12.5 ml/hr) - Warfarin 10 mg x 1 dose at 1800 today - Will continue to monitor for any signs/symptoms of bleeding and will follow up with heparin level and PT/INR in the a.m.   Thank you for allowing pharmacy to be a part of this patient's care.  Alycia Rossetti, PharmD, BCPS Clinical Pharmacist Pager: [phone] Clinical phone for 11/07/2018 from 7a-3:30p: [phone] If  after 3:30p, please call main pharmacy at: [phone] Please check AMION for all [place] numbers 11/07/2018 11:37 AM

## 2018-11-08 LAB — GLUCOSE, CAPILLARY: Glucose-Capillary: 63 mg/dL — ABNORMAL LOW (ref 70–99)

## 2018-11-08 LAB — CBC
HCT: 35 % — ABNORMAL LOW (ref 36.0–46.0)
Hemoglobin: 11.1 g/dL — ABNORMAL LOW (ref 12.0–15.0)
MCH: 30.9 pg (ref 26.0–34.0)
MCHC: 31.7 g/dL (ref 30.0–36.0)
MCV: 97.5 fL (ref 80.0–100.0)
Platelets: 150 10*3/uL (ref 150–400)
RBC: 3.59 MIL/uL — ABNORMAL LOW (ref 3.87–5.11)
RDW: 14.8 % (ref 11.5–15.5)
WBC: 6.5 10*3/uL (ref 4.0–10.5)
nRBC: 0 % (ref 0.0–0.2)

## 2018-11-08 LAB — HEPARIN LEVEL (UNFRACTIONATED): Heparin Unfractionated: 0.52 IU/mL (ref 0.30–0.70)

## 2018-11-08 LAB — PROTIME-INR
INR: 1.64
Prothrombin Time: 19.2 seconds — ABNORMAL HIGH (ref 11.4–15.2)

## 2018-11-08 MED ORDER — WARFARIN SODIUM 5 MG PO TABS
10.0000 mg | ORAL_TABLET | Freq: Once | ORAL | Status: AC
  Administered 2018-11-08: 10 mg via ORAL
  Filled 2018-11-08: qty 2

## 2018-11-08 MED ORDER — CHLORHEXIDINE GLUCONATE CLOTH 2 % EX PADS
6.0000 | MEDICATED_PAD | Freq: Every day | CUTANEOUS | Status: DC
  Administered 2018-11-08: 6 via TOPICAL

## 2018-11-08 NOTE — Progress Notes (Signed)
PROGRESS NOTE    Brenda Arnold  VQQ:[id number] DOB: 09/19/1956 DOA: 11/03/2018 PCP: Kelton Pillar, MD   Brief Narrative: 62 y.o.femalewith medical history significant of ESRD on HD, PAF, HTN, HLD, DM type 2, and hypotension; who presents with acute onset chest pain. She had undergone an elective right hip replacement by Dr. Erlinda Hong on 10/15, and was placed on 81 mg of aspirin following the procedure. Patient reports taking this medication as advised and had been doing well. She had gone for a follow-up appointment today, and to also discuss left hip replacement. During the appointment she reports having acute onset of substernal burning pain with chest tightness making it difficult for her to breathe. She initially thought symptoms may be related to the reflux, but symptoms did not improve. She was advised to go to the emergency department. She had shorter episodes once or twice before last week or so. Denies having any leg swelling, calf pain, loss of consciousness, previous blood clots, fever, chills, or diaphoresis. Other associated symptoms include some left hip pain and palpitation. Patient states that she was previously on Coumadin for many years, but had an episode in February 2018 where she had prolonged epistaxis due to INR being elevated to 3.8. Thereafter, patient reports that her primary care provider took her off the Coumadin approximately a year ago. She denies any other issues with bleeding. Last episode of atrial fibrillation was over year ago. She last had a full hemodialysis session on Monday and is due to have a hemodialysis tomorrow. For the last 6 to 8 months she states that she has been having issues with low blood pressures normally running in the 80s over 40s. ED Course:Upon admission into the emergency department patient was noted to be patient was noted to be in A. fib with RVR with heart rates into the 160s with blood pressures as low as 66/49. Due to patient  previously not being on anticoagulation cardioversion was contraindicated. Patient received 200 mg of hydrocortisone, and 250 mL bolus of normal saline IV fluids for blood pressures. Initial troponin 0. Patient had been started on Cardizem drip. Vascular Doppler ultrasound of lower extremities revealed acute DVT of the right common femoral, popliteal, posterior tibial vein. Patient was started on heparin drip. Critical care was consulted as well as cardiology due to patient's symptoms. Patient did return to sinus rhythm. CT angiogram of the chest was pending due to reported iodine allergy.. Patient was pretreated with Benadryl. TRH called to admit. Blood pressure still noted to be low but mildly improved last noted to be 93/54. Patient reported having one episode of nausea in the ED, but currently reports being chest pain-free  Assessment & Plan:   Principal Problem:   Chest pain Active Problems:   Paroxysmal atrial fibrillation (HCC)   DM (diabetes mellitus) ([place])   DVT (deep venous thrombosis) (HCC)   Hypotension   ESRD on hemodialysis (HCC)   CAD (coronary artery disease)   Atrial fibrillation with RVR ([place])   A-fib (HCC)  #1 A. fib RVR-patient with history of paroxysmal atrial fibrillation. She is on heparin and Coumadin at this time. Her INR is 1.0.6 she is to get 10 mg tonight.Since she is a dialysis patient and at high risk for bleeding she cannot be on Lovenox or other oral agents. Appreciate pharmacy input. Patient concerned about bleeding on while on anticoagulation. No reported nasal bleeding over the last 24 hours.   #2 right hip replacement October 2019 now with right lower extremity  acute DVT-continue anticoagulation as above. Patient does not have a PE.She is certainly at high risk for getting a PE with subtherapeutic INR. Continue Coumadin and heparin.  #3 chest pain resolved once A. fib RVR resolved. However patient has CAD and three-vessel coronary  calcification by CT scan of the chest. Patient to follow-up with cardiology as an outpatient.  #4 hyperlipidemia continue statin.  #5 end-stage renal disease on hemodialysis patient to receive dialysis tomorrow.  #6 hypotensionimproved not on any antihypertensives.  #7 type 2 diabetes patient carries a history of diabetes. But she is not taking any medications at home or here.Her blood sugars have been lower in the morning in the 60s.          Estimated body mass index is 37.14 kg/m as calculated from the following:   Height as of this encounter: 5\' 5"  (1.651 m).   Weight as of this encounter: 101.2 kg.  DVT prophylaxis: Heparin and Coumadin Code Status: Full code Family Communication: None Disposition Plan: None   Consultants: None  Procedures: None Antimicrobials: None   Subjective: Resting in bed in no acute distress denies any further nosebleed no nausea vomiting diarrhea reported   Objective: Vitals:   11/07/18 1235 11/07/18 1316 11/07/18 2143 11/08/18 0602  BP: (!) 87/42 (!) 90/46 (!) 93/54 (!) 98/49  Pulse: 84 79 83 79  Resp: 14 18 18 18   Temp: 97.6 F (36.4 C) 97.7 F (36.5 C) 98.4 F (36.9 C) 98.1 F (36.7 C)  TempSrc: Oral     SpO2: 98% 100% 100% 99%  Weight: 102.7 kg   101.2 kg  Height:        Intake/Output Summary (Last 24 hours) at 11/08/2018 1436 Last data filed at 11/08/2018 0900 Gross per 24 hour  Intake 1360.51 ml  Output -  Net 1360.51 ml   Filed Weights   11/07/18 0740 11/07/18 1235 11/08/18 0602  Weight: 104.8 kg 102.7 kg 101.2 kg    Examination:  General exam: Appears calm and comfortable  Respiratory system: Clear to auscultation. Respiratory effort normal. Cardiovascular system: S1 & S2 heard, RRR. No JVD, murmurs, rubs, gallops or clicks. No pedal edema. Gastrointestinal system: Abdomen is nondistended, soft and nontender. No organomegaly or masses felt. Normal bowel sounds heard. Central nervous system: Alert and  oriented. No focal neurological deficits. Extremities: 1+ edema bilaterally Skin: No rashes, lesions or ulcers Psychiatry: Judgement and insight appear normal. Mood & affect appropriate.     Data Reviewed: I have personally reviewed following labs and imaging studies  CBC: Recent Labs  Lab 11/05/18 0533 11/06/18 0349 11/06/18 1026 11/07/18 0435 11/08/18 0427  WBC 7.1 6.5 8.3 7.8 6.5  HGB 11.8* 11.1* 12.0 10.9* 11.1*  HCT 37.4 35.5* 38.4 34.8* 35.0*  MCV 98.7 97.8 98.2 96.1 97.5  PLT 150 163 178 185 268   Basic Metabolic Panel: Recent Labs  Lab 11/03/18 1529 11/04/18 0216 11/04/18 0827 11/06/18 1026 11/07/18 0824  NA 137 133*  --  136 131*  K 4.1 5.2*  --  4.4 4.1  CL 94* 94*  --  94* 92*  CO2 25 21*  --  26 23  GLUCOSE 93 126*  --  78 75  BUN 39* 48*  --  39* 52*  CREATININE 8.81* 9.22*  --  9.17* 10.29*  CALCIUM 9.1 8.5*  --  9.2 8.7*  MG  --   --  2.1  --   --   PHOS  --  6.7*  --  5.7* 5.4*   GFR: Estimated Creatinine Clearance: 6.8 mL/min (A) (by C-G formula based on SCr of 10.29 mg/dL (H)). Liver Function Tests: Recent Labs  Lab 11/04/18 0216 11/06/18 1026 11/07/18 0824  ALBUMIN 3.2* 3.5 3.0*   No results for input(s): LIPASE, AMYLASE in the last 168 hours. No results for input(s): AMMONIA in the last 168 hours. Coagulation Profile: Recent Labs  Lab 11/05/18 0533 11/06/18 0349 11/07/18 0435 11/08/18 0427  INR 1.10 1.20 1.30 1.64   Cardiac Enzymes: Recent Labs  Lab 11/03/18 2241 11/04/18 0216 11/04/18 0827  TROPONINI 0.08* 0.09* 0.08*   BNP (last 3 results) No results for input(s): PROBNP in the last 8760 hours. HbA1C: No results for input(s): HGBA1C in the last 72 hours. CBG: Recent Labs  Lab 11/06/18 0804 11/06/18 0901 11/06/18 1139 11/07/18 1310 11/08/18 0848  GLUCAP 62* 87 74 89 63*   Lipid Profile: No results for input(s): CHOL, HDL, LDLCALC, TRIG, CHOLHDL, LDLDIRECT in the last 72 hours. Thyroid Function Tests: No  results for input(s): TSH, T4TOTAL, FREET4, T3FREE, THYROIDAB in the last 72 hours. Anemia Panel: No results for input(s): VITAMINB12, FOLATE, FERRITIN, TIBC, IRON, RETICCTPCT in the last 72 hours. Sepsis Labs: No results for input(s): PROCALCITON, LATICACIDVEN in the last 168 hours.  Recent Results (from the past 240 hour(s))  MRSA PCR Screening     Status: None   Collection Time: 11/04/18  6:57 PM  Result Value Ref Range Status   MRSA by PCR NEGATIVE NEGATIVE Final    Comment:        The GeneXpert MRSA Assay (FDA approved for NASAL specimens only), is one component of a comprehensive MRSA colonization surveillance program. It is not intended to diagnose MRSA infection nor to guide or monitor treatment for MRSA infections. Performed at [facility], [place] [address], [place], [place] [address]          Radiology Studies: No results found.      Scheduled Meds: . atorvastatin  40 mg Oral q1800  . calcitRIOL  1.5 mcg Oral Once per day on Mon Wed Fri  . Chlorhexidine Gluconate Cloth  6 each Topical Q0600  . gabapentin  100 mg Oral QHS  . multivitamin  1 tablet Oral QHS  . sodium chloride flush  3 mL Intravenous Q12H  . sucroferric oxyhydroxide  1,000 mg Oral TID WC  . warfarin  10 mg Oral ONCE-1800  . Warfarin - Pharmacist Dosing Inpatient   Does not apply q1800   Continuous Infusions: . heparin 1,250 Units/hr (11/08/18 6967)     LOS: 3 days     Georgette Shell, MD [facility]  If 7PM-7AM, please contact night-coverage [email or web address] Password [facility] 11/08/2018, 2:36 PM

## 2018-11-08 NOTE — Progress Notes (Signed)
 [facility] Progress Note   Dialysis Orders: MWF at [facility] 4hr, 450/800, 2K/2Ca, EDW 101.5kg, L AVF, heparin 8000 + 3000 mid-run bolus - Calcitriol 1.50mcg PO q HD - Mircera 44mcg IV q 2 weeks (last 11/26) - Parsabiv 12.5mg  IV q HD - Binders: Velphoro 2/meals.  Assessment/Plan: 1. Acute R DVT - Hx recent surgery. No symptoms. On heparin/warfarin.  Awaiting therapeutic INR, 1.64 today.    2. Chest pain - resolved 3. A fib w/RVR - Hx A fib.  Off warfarin>58yr.  Restarted.  Per primary. 4. ESRD - on HD MWF. K 4.1.  Will write orders for HD tomorrow per regular schedule in case patient remains admitted.   5. Anemia of CKD- Hgb 11.1. ESA recently dosed.  No indication for ESA at this time. 6. Secondary hyperparathyroidism - Ca 8.7, Phos 5.4. Continue binders and VDRA.  Parsabiv not available in hospital.  7. Hypotension/volume - BP chronically low.  Does not appear volume overloaded on exam. Meeting EDW. 8. Nutrition - Renal diet w/fluid restrictions.  9. DMT2 10. Dispo - Per primary waiting for therapeutic INR for discharge.  Will need to have provider for OP warfarin management set up prior to discharge, as this is not managed in OP dialysis centers.  If able to d/c tomorrow could go to OP dialysis in the afternoon, normal treatment time around [place], PA-C [facility]  Pager: [phone] 11/08/2018,11:22 AM  LOS: 3 days   Subjective:    Seen and examined at bedside.  Feeling well.  No new complaints.  Hoping to go home tomorrow.  Objective Vitals:   11/07/18 1235 11/07/18 1316 11/07/18 2143 11/08/18 0602  BP: (!) 87/42 (!) 90/46 (!) 93/54 (!) 98/49  Pulse: 84 79 83 79  Resp: 14 18 18 18   Temp: 97.6 F (36.4 C) 97.7 F (36.5 C) 98.4 F (36.9 C) 98.1 F (36.7 C)  TempSrc: Oral     SpO2: 98% 100% 100% 99%  Weight: 102.7 kg   101.2 kg  Height:       Physical Exam General:NAD, well appearing, obese female, sitting in  bed Heart:RRR, no mrg Lungs:CTAB, nml WOB Abdomen:soft, NTND Extremities:no LE edema Dialysis Access: LU AVF +b/t   Filed Weights   11/07/18 0740 11/07/18 1235 11/08/18 0602  Weight: 104.8 kg 102.7 kg 101.2 kg    Intake/Output Summary (Last 24 hours) at 11/08/2018 1122 Last data filed at 11/08/2018 0900 Gross per 24 hour  Intake 1480.51 ml  Output 1044 ml  Net 436.51 ml    Additional Objective Labs: Basic Metabolic Panel: Recent Labs  Lab 11/04/18 0216 11/06/18 1026 11/07/18 0824  NA 133* 136 131*  K 5.2* 4.4 4.1  CL 94* 94* 92*  CO2 21* 26 23  GLUCOSE 126* 78 75  BUN 48* 39* 52*  CREATININE 9.22* 9.17* 10.29*  CALCIUM 8.5* 9.2 8.7*  PHOS 6.7* 5.7* 5.4*   Liver Function Tests: Recent Labs  Lab 11/04/18 0216 11/06/18 1026 11/07/18 0824  ALBUMIN 3.2* 3.5 3.0*   CBC: Recent Labs  Lab 11/05/18 0533 11/06/18 0349 11/06/18 1026 11/07/18 0435 11/08/18 0427  WBC 7.1 6.5 8.3 7.8 6.5  HGB 11.8* 11.1* 12.0 10.9* 11.1*  HCT 37.4 35.5* 38.4 34.8* 35.0*  MCV 98.7 97.8 98.2 96.1 97.5  PLT 150 163 178 185 150   Blood Culture    Component Value Date/Time   SDES URINE, RANDOM 11/05/2012 2355   SPECREQUEST NONE 11/05/2012 2355   CULT NO GROWTH  11/05/2012 2355   REPTSTATUS 11/07/2012 FINAL 11/05/2012 2355    Cardiac Enzymes: Recent Labs  Lab 11/03/18 2241 11/04/18 0216 11/04/18 0827  TROPONINI 0.08* 0.09* 0.08*   CBG: Recent Labs  Lab 11/06/18 0804 11/06/18 0901 11/06/18 1139 11/07/18 1310 11/08/18 0848  GLUCAP 62* 87 74 89 63*   Lab Results  Component Value Date   INR 1.64 11/08/2018   INR 1.30 11/07/2018   INR 1.20 11/06/2018   Studies/Results: No results found.  Medications: . heparin 1,250 Units/hr (11/08/18 9735)   . atorvastatin  40 mg Oral q1800  . calcitRIOL  1.5 mcg Oral Once per day on Mon Wed Fri  . Chlorhexidine Gluconate Cloth  6 each Topical Q0600  . gabapentin  100 mg Oral QHS  . multivitamin  1 tablet Oral QHS  . sodium  chloride flush  3 mL Intravenous Q12H  . sucroferric oxyhydroxide  1,000 mg Oral TID WC  . warfarin  10 mg Oral ONCE-1800  . Warfarin - Pharmacist Dosing Inpatient   Does not apply [phone]

## 2018-11-08 NOTE — Progress Notes (Signed)
[place] for Heparin and Warfarin  Indication:  VTE treatment (new bilateral DVTs), afib with RVR  Patient Measurements: Height: 5\' 5"  (165.1 cm) Weight: 223 lb 3.2 oz (101.2 kg) IBW/kg (Calculated) : 57 Heparin Dosing Weight: 80 kg   Vital Signs: Temp: 98.1 F (36.7 C) (12/08 0602) BP: 98/49 (12/08 0602) Pulse Rate: 79 (12/08 0602)  Labs: Recent Labs    11/06/18 0349 11/06/18 1026 11/07/18 0435 11/07/18 0824 11/08/18 0427  HGB 11.1* 12.0 10.9*  --  11.1*  HCT 35.5* 38.4 34.8*  --  35.0*  PLT 163 178 185  --  150  LABPROT 15.1  --  16.1*  --  19.2*  INR 1.20  --  1.30  --  1.64  HEPARINUNFRC 0.56  --  0.52  --  0.52  CREATININE  --  9.17*  --  10.29*  --     Assessment: 10 YOF who presented with chest pain. She has a h/o of Afib not on anticoagulation. Currently on Afib with RVR. Pharmacy on board for Heparin and Warfarin dosing.   Heparin level this morning is therapeutic (HL 0.52, goal of 0.3-0.7). INR remains SUBtherapeutic but is trending up nicely (INR 1.64 << 1.3, goal of 2-3). Hgb/Hct low but stable - no bleeding noted. Will continue with higher warfarin dose today.   Due to patient's renal failure, enoxaparin would not be recommended due to abnormal kinetics and risk of bleeding. Would recommend continuing heparin drip until patient is therapeutic (INR 2-3).    Goal of Therapy:  INR 2-3 Heparin level 0.3-0.7 units/ml Monitor platelets by anticoagulation protocol: Yes   Plan:  - Continue Heparin at 1250 units/hr (12.5 ml/hr) - Warfarin 10 mg x 1 dose at 1800 today - Will continue to monitor for any signs/symptoms of bleeding and will follow up with heparin level and PT/INR in the a.m.   Thank you for allowing pharmacy to be a part of this patient's care.  Alycia Rossetti, PharmD, BCPS Clinical Pharmacist Pager: [phone] Clinical phone for 11/08/2018 from 7a-3:30p: [phone] If after 3:30p, please call main pharmacy at:  [phone] Please check AMION for all [place] numbers 11/08/2018 10:32 AM

## 2018-11-09 LAB — RENAL FUNCTION PANEL
Albumin: 3.2 g/dL — ABNORMAL LOW (ref 3.5–5.0)
Anion gap: 15 (ref 5–15)
BUN: 47 mg/dL — ABNORMAL HIGH (ref 8–23)
CO2: 23 mmol/L (ref 22–32)
Calcium: 9.2 mg/dL (ref 8.9–10.3)
Chloride: 92 mmol/L — ABNORMAL LOW (ref 98–111)
Creatinine, Ser: 9.6 mg/dL — ABNORMAL HIGH (ref 0.44–1.00)
GFR calc Af Amer: 5 mL/min — ABNORMAL LOW (ref >60)
GFR calc non Af Amer: 4 mL/min — ABNORMAL LOW (ref >60)
Glucose, Bld: 83 mg/dL (ref 70–99)
Phosphorus: 4.6 mg/dL (ref 2.5–4.6)
Potassium: 4.2 mmol/L (ref 3.5–5.1)
Sodium: 130 mmol/L — ABNORMAL LOW (ref 135–145)

## 2018-11-09 LAB — CBC
HCT: 33.4 % — ABNORMAL LOW (ref 36.0–46.0)
Hemoglobin: 10.5 g/dL — ABNORMAL LOW (ref 12.0–15.0)
MCH: 30.3 pg (ref 26.0–34.0)
MCHC: 31.4 g/dL (ref 30.0–36.0)
MCV: 96.5 fL (ref 80.0–100.0)
Platelets: 148 10*3/uL — ABNORMAL LOW (ref 150–400)
RBC: 3.46 MIL/uL — ABNORMAL LOW (ref 3.87–5.11)
RDW: 14.6 % (ref 11.5–15.5)
WBC: 6.4 10*3/uL (ref 4.0–10.5)
nRBC: 0 % (ref 0.0–0.2)

## 2018-11-09 LAB — PROTIME-INR
INR: 1.97
Prothrombin Time: 22.2 seconds — ABNORMAL HIGH (ref 11.4–15.2)

## 2018-11-09 LAB — GLUCOSE, CAPILLARY
Glucose-Capillary: 69 mg/dL — ABNORMAL LOW (ref 70–99)
Glucose-Capillary: 88 mg/dL (ref 70–99)

## 2018-11-09 LAB — HEPARIN LEVEL (UNFRACTIONATED): Heparin Unfractionated: 0.54 IU/mL (ref 0.30–0.70)

## 2018-11-09 MED ORDER — WARFARIN SODIUM 5 MG PO TABS
10.0000 mg | ORAL_TABLET | Freq: Once | ORAL | Status: DC
  Filled 2018-11-09: qty 1

## 2018-11-09 MED ORDER — HEPARIN SODIUM (PORCINE) 1000 UNIT/ML DIALYSIS: 20.0000 [IU]/kg | INTRAMUSCULAR | Status: DC | PRN

## 2018-11-09 MED ORDER — CALCITRIOL 0.5 MCG PO CAPS: 1.5000 ug | ORAL_CAPSULE | ORAL | 0 refills | Status: DC

## 2018-11-09 MED ORDER — SODIUM CHLORIDE 0.9 % IV SOLN: 100.0000 mL | INTRAVENOUS | Status: DC | PRN

## 2018-11-09 MED ORDER — ALTEPLASE 2 MG IJ SOLR: 2.0000 mg | Freq: Once | INTRAMUSCULAR | Status: DC | PRN

## 2018-11-09 MED ORDER — WARFARIN SODIUM 5 MG PO TABS
10.0000 mg | ORAL_TABLET | ORAL | Status: AC
  Administered 2018-11-09: 10 mg via ORAL
  Filled 2018-11-09: qty 2

## 2018-11-09 MED ORDER — LIDOCAINE-PRILOCAINE 2.5-2.5 % EX CREA: 1.0000 "application " | TOPICAL_CREAM | CUTANEOUS | Status: DC | PRN

## 2018-11-09 MED ORDER — HEPARIN SODIUM (PORCINE) 1000 UNIT/ML DIALYSIS: 1000.0000 [IU] | INTRAMUSCULAR | Status: DC | PRN

## 2018-11-09 MED ORDER — ATORVASTATIN CALCIUM 40 MG PO TABS: 40.0000 mg | ORAL_TABLET | Freq: Every day | ORAL | 1 refills | Status: DC

## 2018-11-09 MED ORDER — CALCITRIOL 0.5 MCG PO CAPS
ORAL_CAPSULE | ORAL | Status: AC
  Filled 2018-11-09: qty 3

## 2018-11-09 MED ORDER — DIPHENHYDRAMINE HCL 25 MG PO CAPS: 25.0000 mg | ORAL_CAPSULE | Freq: Every evening | ORAL | Status: DC | PRN

## 2018-11-09 MED ORDER — PENTAFLUOROPROP-TETRAFLUOROETH EX AERO: 1.0000 "application " | INHALATION_SPRAY | CUTANEOUS | Status: DC | PRN

## 2018-11-09 MED ORDER — LIDOCAINE HCL (PF) 1 % IJ SOLN: 5.0000 mL | INTRAMUSCULAR | Status: DC | PRN

## 2018-11-09 MED ORDER — WARFARIN SODIUM 7.5 MG PO TABS: 7.5000 mg | ORAL_TABLET | Freq: Every day | ORAL | 11 refills | Status: DC

## 2018-11-09 NOTE — Progress Notes (Addendum)
  [facility] Progress Note   Subjective: Seen on HD, 3L UF goal. Tolerating without issues. No CP/dyspnea. Awaiting INR to be therapeutic.  Objective Vitals:   11/09/18 0900 11/09/18 0930 11/09/18 1000 11/09/18 1030  BP: (!) 103/54 (!) 89/41 (!) 93/38 126/71  Pulse: 79 72 83 87  Resp:      Temp:      TempSrc:      SpO2:      Weight:      Height:       Physical Exam General: Well appearing, NAD Heart: RRR; no murmur Lungs: CTAB Abdomen: soft, non-tender Extremities: trace LE edema, no tenderness Dialysis Access: AVF (cannulated)  Additional Objective Labs: Basic Metabolic Panel: Recent Labs  Lab 11/06/18 1026 11/07/18 0824 11/09/18 1008  NA 136 131* 130*  K 4.4 4.1 4.2  CL 94* 92* 92*  CO2 26 23 23   GLUCOSE 78 75 83  BUN 39* 52* 47*  CREATININE 9.17* 10.29* 9.60*  CALCIUM 9.2 8.7* 9.2  PHOS 5.7* 5.4* 4.6   Liver Function Tests: Recent Labs  Lab 11/06/18 1026 11/07/18 0824 11/09/18 1008  ALBUMIN 3.5 3.0* 3.2*   CBC: Recent Labs  Lab 11/06/18 0349 11/06/18 1026 11/07/18 0435 11/08/18 0427 11/09/18 1009  WBC 6.5 8.3 7.8 6.5 6.4  HGB 11.1* 12.0 10.9* 11.1* 10.5*  HCT 35.5* 38.4 34.8* 35.0* 33.4*  MCV 97.8 98.2 96.1 97.5 96.5  PLT 163 178 185 150 148*   Medications: . sodium chloride    . sodium chloride    . heparin 1,250 Units/hr (11/09/18 0338)   . atorvastatin  40 mg Oral q1800  . calcitRIOL  1.5 mcg Oral Once per day on Mon Wed Fri  . Chlorhexidine Gluconate Cloth  6 each Topical Q0600  . gabapentin  100 mg Oral QHS  . multivitamin  1 tablet Oral QHS  . sodium chloride flush  3 mL Intravenous Q12H  . sucroferric oxyhydroxide  1,000 mg Oral TID WC  . warfarin  10 mg Oral ONCE-1800  . Warfarin - Pharmacist Dosing Inpatient   Does not apply q1800    Dialysis Orders: MWF at [facility] 4hr, 450/800, 2K/2Ca, EDW 101.5kg, L AVF, heparin 8000 + 3000 mid-run bolus - Calcitriol 1.79mcg PO q HD - Mircera 60mcg IV q 2 weeks (last  11/26) - Parsabiv 12.5mg  IV q HD - Binders: Velphoro 2/meals.  Assessment/Plan: 1.Acute R DVT - Hx recent surgery. No symptoms. On heparin/warfarin. Awaiting therapeutic INR. 2. Chest pain (resolved). 3. A fib w/RVR - Hx A fib. Off warfarin>31yr. Restarted. Per primary. 4. ESRD: Continue HD per usual MWF schedule. 5. Anemia of CKD: Hgb 10.5. Not due for ESA yet. 6. Secondary hyperparathyroidism: Labs ok. Continue binders and VDRA. Parsabiv not available in hospital.  7. Hypotension/volume -BP chronically low. Does not appear volume overloaded on exam. Meeting EDW. 8. Nutrition -Renal diet w/fluid restrictions.  9. Type 2 DM 10. Dispo - Per primary waiting for therapeutic INR for discharge.  Will need to have provider for OP warfarin management set up prior to discharge, as this is not managed in OP dialysis centers.   Veneta Penton, PA-C 11/09/2018, 11:46 AM  [facility] Pager: [phone]  Pt seen, examined and agree w A/P as above.  Kelly Splinter MD [employer] pager [phone]   11/09/2018, 12:29 PM

## 2018-11-09 NOTE — Progress Notes (Signed)
[place] for Heparin and Warfarin  Indication:  VTE treatment (new bilateral DVTs), afib with RVR  Patient Measurements: Height: 5\' 5"  (165.1 cm) Weight: 223 lb 3.2 oz (101.2 kg) IBW/kg (Calculated) : 57 Heparin Dosing Weight: 80 kg   Vital Signs: Temp: 98.4 F (36.9 C) (12/09 0550) BP: 105/60 (12/09 0550) Pulse Rate: 78 (12/09 0550)  Labs: Recent Labs    11/06/18 1026 11/07/18 0435 11/07/18 0824 11/08/18 0427 11/09/18 0322  HGB 12.0 10.9*  --  11.1*  --   HCT 38.4 34.8*  --  35.0*  --   PLT 178 185  --  150  --   LABPROT  --  16.1*  --  19.2* 22.2*  INR  --  1.30  --  1.64 1.97  HEPARINUNFRC  --  0.52  --  0.52 0.54  CREATININE 9.17*  --  10.29*  --   --     Assessment: 7 YOF who presented with chest pain. She has a h/o of Afib not on anticoagulation. Currently on Afib with RVR. Pharmacy on board for Heparin and Warfarin dosing.   Heparin level this morning is therapeutic (HL 0.52, goal of 0.3-0.7). INR = 1.97   Goal of Therapy:  INR 2-3 Heparin level 0.3-0.7 units/ml Monitor platelets by anticoagulation protocol: Yes   Plan:  - Continue Heparin at 1250 units/hr (12.5 ml/hr) - Repeat Warfarin 10 mg x 1 dose at 1800 today   - Likely home with Warfarin 7.5 mg po daily? - Follow up AM labs  Thank you Anette Guarneri, PharmD [phone] 11/09/2018 9:51 AM

## 2018-11-09 NOTE — Progress Notes (Signed)
Pt given discharge instructions, prescriptions, and care notes. Pt verbalized understanding AEB no further questions or concerns at this time. IV was discontinued, no redness, pain, or swelling noted at this time. Telemetry discontinued and Centralized Telemetry was notified. Pt left the floor via wheelchair with staff in stable condition. 

## 2018-11-09 NOTE — Progress Notes (Signed)
PT Cancellation Note  Patient Details Name: Brenda Arnold MRN: [id number] DOB: 09/28/1956   Cancelled Treatment:    Reason Eval/Treat Not Completed: Patient at procedure or test/unavailable   Currently in HD;  Will continue to follow,   Roney Marion, PT  Acute Rehabilitation Services Pager [phone] Office [place] 11/09/2018, 8:38 AM

## 2018-11-09 NOTE — Discharge Summary (Signed)
Physician Discharge Summary  Brenda Arnold RJJ:[id number] DOB: 12-20-1955 DOA: 11/03/2018  PCP: Kelton Pillar, MD  Admit date: 11/03/2018 Discharge date: 11/09/2018  Admitted From: Disposition:  Recommendations for Outpatient Follow-up:  1. Follow up with PCP in 1-2 weeks 2. Follow-up with PCP for INR check 11/10/2018 and twice a week till INR stabilizes. 3. Follow-up with cardiology Dr. Radford Pax  Home Health: None  Equipment/Devices none Discharge Condition: Stable CODE STATUS: Full code Diet recommendation: Cardiac  Brief/Interim Summary:62 y.o.femalewith medical history significant of ESRD on HD, PAF, HTN, HLD, DM type 2, and hypotension; who presents with acute onset chest pain. She had undergone an elective right hip replacement by Dr. Erlinda Hong on 10/15, and was placed on 81 mg of aspirin following the procedure. Patient reports taking this medication as advised and had been doing well. She had gone for a follow-up appointment today, and to also discuss left hip replacement. During the appointment she reports having acute onset of substernal burning pain with chest tightness making it difficult for her to breathe. She initially thought symptoms may be related to the reflux, but symptoms did not improve. She was advised to go to the emergency department. She had shorter episodes once or twice before last week or so. Denies having any leg swelling, calf pain, loss of consciousness, previous blood clots, fever, chills, or diaphoresis. Other associated symptoms include some left hip pain and palpitation. Patient states that she was previously on Coumadin for many years, but had an episode in February 2018 where she had prolonged epistaxis due to INR being elevated to 3.8. Thereafter, patient reports that her primary care provider took her off the Coumadin approximately a year ago. She denies any other issues with bleeding. Last episode of atrial fibrillation was over year ago. She last  had a full hemodialysis session on Monday and is due to have a hemodialysis tomorrow. For the last 6 to 8 months she states that she has been having issues with low blood pressures normally running in the 80s over 40s. ED Course:Upon admission into the emergency department patient was noted to be patient was noted to be in A. fib with RVR with heart rates into the 160s with blood pressures as low as 66/49. Due to patient previously not being on anticoagulation cardioversion was contraindicated. Patient received 200 mg of hydrocortisone, and 250 mL bolus of normal saline IV fluids for blood pressures. Initial troponin 0. Patient had been started on Cardizem drip. Vascular Doppler ultrasound of lower extremities revealed acute DVT of the right common femoral, popliteal, posterior tibial vein. Patient was started on heparin drip. Critical care was consulted as well as cardiology due to patient's symptoms. Patient did return to sinus rhythm. CT angiogram of the chest was pending due to reported iodine allergy.. Patient was pretreated with Benadryl. [facility] called to admit. Blood pressure still noted to be low but mildly improved last noted to be 93/54. Patient reported having one episode of nausea in the ED, but currently reports being chest pain-free   Discharge Diagnoses:  Principal Problem:   Chest pain Active Problems:   Paroxysmal atrial fibrillation (HCC)   DM (diabetes mellitus) ([place])   DVT (deep venous thrombosis) (HCC)   Hypotension   ESRD on hemodialysis (HCC)   CAD (coronary artery disease)   Atrial fibrillation with RVR ([place])   A-fib (HCC)  #1 A. fib RVR-patient with history of paroxysmal atrial fibrillation. She is on heparin and Coumadin at this time. Her INR is 1.  9 7 She is to get 10 mg tonight. She will be discharged home today after getting a 10 mg of Coumadin at the hospital.  She will follow-up with PCP tomorrow to check her INR 11/10/2018.  Her INR needs to be  followed closely as an outpatient basis till her INR is stabilized and therapeutic.    #2 right hip replacement October 2019 now with right lower extremity acute continue Coumadin   DVT-continue anticoagulation as above. Patient does not have a PE.  She is certainly at high risk for getting a PE with subtherapeutic INR.  Continue Coumadin  #3 chest pain resolved once A. fib RVR resolved. However patient has CAD and three-vessel coronary calcification by CT scan of the chest. Patient to follow-up with cardiology as an outpatient.  #4 hyperlipidemia continue statin.  #5 end-stage renal disease on hemodialysis patient to receive dialysis tomorrow.  #6 hypotension improved not on any antihypertensives.  #7 type 2 diabetes patient carries a history of diabetes. But she is not taking any medications at home or here.  Her blood sugars have been lower in the morning in the 60s.      Estimated body mass index is 38.41 kg/m as calculated from the following:   Height as of this encounter: 5\' 5"  (1.651 m).   Weight as of this encounter: 104.7 kg.  Discharge Instructions  Discharge Instructions    Call MD for:  persistant dizziness or light-headedness   Complete by:  As directed    Call MD for:  persistant nausea and vomiting   Complete by:  As directed    Call MD for:  redness, tenderness, or signs of infection (pain, swelling, redness, odor or green/yellow discharge around incision site)   Complete by:  As directed    Diet - low sodium heart healthy   Complete by:  As directed    Increase activity slowly   Complete by:  As directed      Allergies as of 11/09/2018      Reactions   Iodine Shortness Of Breath   Shellfish Allergy Shortness Of Breath   Norvasc [amlodipine] Swelling   SWELLING REACTION UNSPECIFIED       Medication List    STOP taking these medications   sulfamethoxazole-trimethoprim 400-80 MG tablet Commonly known as:  BACTRIM,SEPTRA     TAKE these  medications   aspirin EC 81 MG tablet Take 1 tablet (81 mg total) by mouth 2 (two) times daily.   atorvastatin 40 MG tablet Commonly known as:  LIPITOR Take 1 tablet (40 mg total) by mouth daily at 6 PM.   calcitRIOL 0.5 MCG capsule Commonly known as:  ROCALTROL Take 3 capsules (1.5 mcg total) by mouth 3 (three) times a week. Start taking on:  11/11/2018   DIALYVITE TABLET Tabs Take 1 tablet by mouth at bedtime.   ethyl chloride spray Apply 1 application topically See admin instructions. For dialysis   gabapentin 100 MG capsule Commonly known as:  NEURONTIN Take 100 mg by mouth at bedtime.   methocarbamol 500 MG tablet Commonly known as:  ROBAXIN Take 1 tablet (500 mg total) by mouth every 6 (six) hours as needed for muscle spasms.   ondansetron 4 MG tablet Commonly known as:  ZOFRAN Take 1-2 tablets (4-8 mg total) by mouth every 8 (eight) hours as needed for nausea or vomiting.   oxyCODONE 5 MG immediate release tablet Commonly known as:  Oxy IR/ROXICODONE Take 1-3 tablets (5-15 mg total) by mouth every  4 (four) hours as needed.   promethazine 25 MG tablet Commonly known as:  PHENERGAN Take 1 tablet (25 mg total) by mouth every 6 (six) hours as needed for nausea.   senna-docusate 8.6-50 MG tablet Commonly known as:  Senokot-S Take 1 tablet by mouth at bedtime as needed.   VELPHORO 500 MG chewable tablet Generic drug:  sucroferric oxyhydroxide Chew 2 capsules by mouth See admin instructions. 2 each three times daily with meals, and 2 for 1 daily snack   warfarin 7.5 MG tablet Commonly known as:  COUMADIN Take 1 tablet (7.5 mg total) by mouth daily.      Follow-up Information    Kelton Pillar, MD Follow up.   Specialty:  Family Medicine Why:  check INR 12/10 AND twice week till inr stable Contact information: [address] [place] [address] [phone]        Sueanne Margarita, MD .   Specialty:  Cardiology Contact  information: [phone] [facility] [place] [address] [phone]          Allergies  Allergen Reactions  . Iodine Shortness Of Breath  . Shellfish Allergy Shortness Of Breath  . Norvasc [Amlodipine] Swelling    SWELLING REACTION UNSPECIFIED     Consultations:  Nephrology   Procedures/Studies: Ct Angio Chest Pe W/cm &/or Wo Cm  Result Date: 11/04/2018 CLINICAL DATA:  62 year old female with chest pain. Concern for pulmonary embolism. EXAM: CT ANGIOGRAPHY CHEST WITH CONTRAST TECHNIQUE: Multidetector CT imaging of the chest was performed using the standard protocol during bolus administration of intravenous contrast. Multiplanar CT image reconstructions and MIPs were obtained to evaluate the vascular anatomy. CONTRAST:  144mL ISOVUE-370 IOPAMIDOL (ISOVUE-370) INJECTION 76% COMPARISON:  Chest radiograph dated 11/03/2018 FINDINGS: Cardiovascular: Top-normal cardiac size. No pericardial effusion. There is multi vessel coronary vascular calcification. Moderate atherosclerotic calcification of the thoracic aorta. No aneurysmal dilatation or evidence of dissection. Evaluation of the pulmonary arteries is very limited due to suboptimal opacification and timing of the contrast and poor visualization of the peripheral branches. No large or central pulmonary artery embolus identified. Mediastinum/Nodes: There is no hilar or mediastinal adenopathy. The esophagus and the thyroid gland are grossly unremarkable. No mediastinal fluid collection. Lungs/Pleura: There are minimal bibasilar atelectatic changes. No focal consolidation, pleural effusion, or pneumothorax. A 6 mm calcified left upper lobe granuloma. The central airways are patent. Upper Abdomen: Left adrenal thickening/hyperplasia. Moderate right renal atrophy. Low attenuating lesions arising from the right kidney measure up to 6 cm in the superior pole are not characterized but likely represent cysts. Musculoskeletal: Degenerative changes  of the spine. Multilevel anterior bridging osteophyte most consistent with DISH. No acute osseous pathology. Review of the MIP images confirms the above findings. IMPRESSION: 1. No acute intrathoracic pathology. No CT evidence of central pulmonary artery embolus. 2. Coronary vascular calcification and Aortic Atherosclerosis (ICD10-I70.0). Electronically Signed   By: Anner Crete M.D.   On: 11/04/2018 00:32   Dg Chest Port 1 View  Result Date: 11/03/2018 CLINICAL DATA:  Acute chest pain today. EXAM: PORTABLE CHEST 1 VIEW COMPARISON:  04/27/2014 and prior exams FINDINGS: UPPER limits normal heart size and pulmonary vascular congestion noted. Mild bibasilar atelectasis identified. There is no evidence of focal airspace disease, pulmonary edema, suspicious pulmonary nodule/mass, pleural effusion, or pneumothorax. No acute bony abnormalities are identified. IMPRESSION: UPPER limits normal heart size with pulmonary vascular congestion. Electronically Signed   By: Margarette Canada M.D.   On: 11/03/2018 16:40  Xr Hip Unilat W Or W/o Pelvis 2-3 Views Left  Result Date: 11/03/2018 Severe degenerative joint disease of her left hip secondary to avascular necrosis  Xr Hip Unilat W Or W/o Pelvis 2-3 Views Right  Result Date: 11/03/2018 Stable appearing right total hip replacement without complication.  Vas [place] Lower Extremity Venous (dvt) (only [facility])  Result Date: 11/04/2018  Lower Venous Study Indications: Tachycardia.  Performing Technologist: Abram Sander RVS  Examination Guidelines: A complete evaluation includes B-mode imaging, spectral Doppler, color Doppler, and power Doppler as needed of all accessible portions of each vessel. Bilateral testing is considered an integral part of a complete examination. Limited examinations for reoccurring indications may be performed as noted.  Right Venous Findings: +---------+---------------+---------+-----------+----------+--------------+           CompressibilityPhasicitySpontaneityPropertiesSummary        +---------+---------------+---------+-----------+----------+--------------+ CFV      None           No       No                                  +---------+---------------+---------+-----------+----------+--------------+ SFJ      Full                                                        +---------+---------------+---------+-----------+----------+--------------+ FV Prox  None                                                        +---------+---------------+---------+-----------+----------+--------------+ FV Mid   None                                                        +---------+---------------+---------+-----------+----------+--------------+ FV DistalNone                                                        +---------+---------------+---------+-----------+----------+--------------+ PFV      Full                                                        +---------+---------------+---------+-----------+----------+--------------+ POP      None           No       No                                  +---------+---------------+---------+-----------+----------+--------------+ PTV      None                                                        +---------+---------------+---------+-----------+----------+--------------+  PERO                                                  not visualized +---------+---------------+---------+-----------+----------+--------------+  Left Venous Findings: +---------+---------------+---------+-----------+----------+-------+          CompressibilityPhasicitySpontaneityPropertiesSummary +---------+---------------+---------+-----------+----------+-------+ CFV      Full           Yes      Yes                          +---------+---------------+---------+-----------+----------+-------+ SFJ      Full                                                  +---------+---------------+---------+-----------+----------+-------+ FV Prox  Full                                                 +---------+---------------+---------+-----------+----------+-------+ FV Mid   Full                                                 +---------+---------------+---------+-----------+----------+-------+ FV DistalFull                                                 +---------+---------------+---------+-----------+----------+-------+ PFV      Full                                                 +---------+---------------+---------+-----------+----------+-------+ POP      Full           Yes      Yes                          +---------+---------------+---------+-----------+----------+-------+ PTV      Full                                                 +---------+---------------+---------+-----------+----------+-------+ PERO     Full                                                 +---------+---------------+---------+-----------+----------+-------+    Summary: Right: Findings consistent with acute deep vein thrombosis involving the right common femoral vein, right femoral vein, right popliteal vein, and right posterior tibial vein. Left: There is no evidence of deep vein thrombosis in the lower extremity. No cystic structure found in the popliteal  fossa.  *See table(s) above for measurements and observations. Electronically signed by Quay Burow MD on 11/04/2018 at 4:56:11 PM.    Final     (Echo, Carotid, EGD, Colonoscopy, ERCP)    Subjective:   Discharge Exam: Vitals:   11/09/18 1000 11/09/18 1030  BP: (!) 93/38 126/71  Pulse: 83 87  Resp:    Temp:    SpO2:     Vitals:   11/09/18 0900 11/09/18 0930 11/09/18 1000 11/09/18 1030  BP: (!) 103/54 (!) 89/41 (!) 93/38 126/71  Pulse: 79 72 83 87  Resp:      Temp:      TempSrc:      SpO2:      Weight:      Height:        General: Pt is alert, awake, not in acute  distress Cardiovascular: RRR, S1/S2 +, no rubs, no gallops Respiratory: CTA bilaterally, no wheezing, no rhonchi Abdominal: Soft, NT, ND, bowel sounds + Extremities: no edema, no cyanosis    The results of significant diagnostics from this hospitalization (including imaging, microbiology, ancillary and laboratory) are listed below for reference.     Microbiology: Recent Results (from the past 240 hour(s))  MRSA PCR Screening     Status: None   Collection Time: 11/04/18  6:57 PM  Result Value Ref Range Status   MRSA by PCR NEGATIVE NEGATIVE Final    Comment:        The GeneXpert MRSA Assay (FDA approved for NASAL specimens only), is one component of a comprehensive MRSA colonization surveillance program. It is not intended to diagnose MRSA infection nor to guide or monitor treatment for MRSA infections. Performed at [facility], [place] [address], [place], [place] [address]      Labs: BNP (last 3 results) No results for input(s): BNP in the last 8760 hours. Basic Metabolic Panel: Recent Labs  Lab 11/03/18 1529 11/04/18 0216 11/04/18 0827 11/06/18 1026 11/07/18 0824 11/09/18 1008  NA 137 133*  --  136 131* 130*  K 4.1 5.2*  --  4.4 4.1 4.2  CL 94* 94*  --  94* 92* 92*  CO2 25 21*  --  26 23 23   GLUCOSE 93 126*  --  78 75 83  BUN 39* 48*  --  39* 52* 47*  CREATININE 8.81* 9.22*  --  9.17* 10.29* 9.60*  CALCIUM 9.1 8.5*  --  9.2 8.7* 9.2  MG  --   --  2.1  --   --   --   PHOS  --  6.7*  --  5.7* 5.4* 4.6   Liver Function Tests: Recent Labs  Lab 11/04/18 0216 11/06/18 1026 11/07/18 0824 11/09/18 1008  ALBUMIN 3.2* 3.5 3.0* 3.2*   No results for input(s): LIPASE, AMYLASE in the last 168 hours. No results for input(s): AMMONIA in the last 168 hours. CBC: Recent Labs  Lab 11/06/18 0349 11/06/18 1026 11/07/18 0435 11/08/18 0427 11/09/18 1009  WBC 6.5 8.3 7.8 6.5 6.4  HGB 11.1* 12.0 10.9* 11.1* 10.5*  HCT 35.5* 38.4 34.8* 35.0* 33.4*  MCV 97.8  98.2 96.1 97.5 96.5  PLT 163 178 185 150 148*   Cardiac Enzymes: Recent Labs  Lab 11/03/18 2241 11/04/18 0216 11/04/18 0827  TROPONINI 0.08* 0.09* 0.08*   BNP: Invalid input(s): POCBNP CBG: Recent Labs  Lab 11/06/18 1139 11/07/18 1310 11/08/18 0848 11/09/18 0724 11/09/18 0811  GLUCAP 74 89 63* 69* 88   D-Dimer No results for input(s):  DDIMER in the last 72 hours. Hgb A1c No results for input(s): HGBA1C in the last 72 hours. Lipid Profile No results for input(s): CHOL, HDL, LDLCALC, TRIG, CHOLHDL, LDLDIRECT in the last 72 hours. Thyroid function studies No results for input(s): TSH, T4TOTAL, T3FREE, THYROIDAB in the last 72 hours.  Invalid input(s): FREET3 Anemia work up No results for input(s): VITAMINB12, FOLATE, FERRITIN, TIBC, IRON, RETICCTPCT in the last 72 hours. Urinalysis    Component Value Date/Time   COLORURINE YELLOW 11/05/2012 2354   APPEARANCEUR CLEAR 11/05/2012 2354   LABSPEC 1.016 11/05/2012 2354   PHURINE 5.5 11/05/2012 2354   GLUCOSEU NEGATIVE 11/05/2012 2354   HGBUR SMALL (A) 11/05/2012 2354   BILIRUBINUR NEGATIVE 11/05/2012 2354   KETONESUR NEGATIVE 11/05/2012 2354   PROTEINUR 100 (A) 11/05/2012 2354   UROBILINOGEN 0.2 11/05/2012 2354   NITRITE NEGATIVE 11/05/2012 2354   LEUKOCYTESUR NEGATIVE 11/05/2012 2354   Sepsis Labs Invalid input(s): PROCALCITONIN,  WBC,  LACTICIDVEN Microbiology Recent Results (from the past 240 hour(s))  MRSA PCR Screening     Status: None   Collection Time: 11/04/18  6:57 PM  Result Value Ref Range Status   MRSA by PCR NEGATIVE NEGATIVE Final    Comment:        The GeneXpert MRSA Assay (FDA approved for NASAL specimens only), is one component of a comprehensive MRSA colonization surveillance program. It is not intended to diagnose MRSA infection nor to guide or monitor treatment for MRSA infections. Performed at [facility], [place] [address], [place], [place] [address]      Time coordinating  discharge:  34 minutes  SIGNED:   Georgette Shell, MD  [facility] 11/09/2018, 12:54 PM Pager   If 7PM-7AM, please contact night-coverage [email or web address] Password [email or web address]

## 2018-11-10 DIAGNOSIS — I480 Paroxysmal atrial fibrillation: Secondary | ICD-10-CM | POA: Diagnosis not present

## 2018-11-10 DIAGNOSIS — Z7901 Long term (current) use of anticoagulants: Secondary | ICD-10-CM | POA: Diagnosis not present

## 2018-11-10 DIAGNOSIS — I82401 Acute embolism and thrombosis of unspecified deep veins of right lower extremity: Secondary | ICD-10-CM | POA: Diagnosis not present

## 2018-11-10 DIAGNOSIS — I129 Hypertensive chronic kidney disease with stage 1 through stage 4 chronic kidney disease, or unspecified chronic kidney disease: Secondary | ICD-10-CM | POA: Diagnosis not present

## 2018-11-11 DIAGNOSIS — N186 End stage renal disease: Secondary | ICD-10-CM | POA: Diagnosis not present

## 2018-11-11 DIAGNOSIS — N2581 Secondary hyperparathyroidism of renal origin: Secondary | ICD-10-CM | POA: Diagnosis not present

## 2018-11-12 DIAGNOSIS — Z7901 Long term (current) use of anticoagulants: Secondary | ICD-10-CM | POA: Diagnosis not present

## 2018-11-13 DIAGNOSIS — N186 End stage renal disease: Secondary | ICD-10-CM | POA: Diagnosis not present

## 2018-11-13 DIAGNOSIS — N2581 Secondary hyperparathyroidism of renal origin: Secondary | ICD-10-CM | POA: Diagnosis not present

## 2018-11-16 DIAGNOSIS — N2581 Secondary hyperparathyroidism of renal origin: Secondary | ICD-10-CM | POA: Diagnosis not present

## 2018-11-16 DIAGNOSIS — N186 End stage renal disease: Secondary | ICD-10-CM | POA: Diagnosis not present

## 2018-11-17 DIAGNOSIS — Z7901 Long term (current) use of anticoagulants: Secondary | ICD-10-CM | POA: Diagnosis not present

## 2018-11-18 DIAGNOSIS — N186 End stage renal disease: Secondary | ICD-10-CM | POA: Diagnosis not present

## 2018-11-18 DIAGNOSIS — N2581 Secondary hyperparathyroidism of renal origin: Secondary | ICD-10-CM | POA: Diagnosis not present

## 2018-11-19 ENCOUNTER — Encounter: Payer: Self-pay | Admitting: Podiatry

## 2018-11-19 ENCOUNTER — Ambulatory Visit (INDEPENDENT_AMBULATORY_CARE_PROVIDER_SITE_OTHER): Payer: BLUE CROSS/BLUE SHIELD | Admitting: Podiatry

## 2018-11-19 VITALS — BP 109/60 | HR 86 | Resp 16

## 2018-11-19 DIAGNOSIS — D689 Coagulation defect, unspecified: Secondary | ICD-10-CM

## 2018-11-19 DIAGNOSIS — M79675 Pain in left toe(s): Secondary | ICD-10-CM

## 2018-11-19 DIAGNOSIS — M79674 Pain in right toe(s): Secondary | ICD-10-CM | POA: Diagnosis not present

## 2018-11-19 DIAGNOSIS — B351 Tinea unguium: Secondary | ICD-10-CM | POA: Diagnosis not present

## 2018-11-19 DIAGNOSIS — L84 Corns and callosities: Secondary | ICD-10-CM | POA: Diagnosis not present

## 2018-11-19 NOTE — Patient Instructions (Signed)
Diabetes Mellitus and Foot Care  Foot care is an important part of your health, especially when you have diabetes. Diabetes may cause you to have problems because of poor blood flow (circulation) to your feet and legs, which can cause your skin to:   Become thinner and drier.   Break more easily.   Heal more slowly.   Peel and crack.  You may also have nerve damage (neuropathy) in your legs and feet, causing decreased feeling in them. This means that you may not notice minor injuries to your feet that could lead to more serious problems. Noticing and addressing any potential problems early is the best way to prevent future foot problems.  How to care for your feet  Foot hygiene   Wash your feet daily with warm water and mild soap. Do not use hot water. Then, pat your feet and the areas between your toes until they are completely dry. Do not soak your feet as this can dry your skin.   Trim your toenails straight across. Do not dig under them or around the cuticle. File the edges of your nails with an emery board or nail file.   Apply a moisturizing lotion or petroleum jelly to the skin on your feet and to dry, brittle toenails. Use lotion that does not contain alcohol and is unscented. Do not apply lotion between your toes.  Shoes and socks   Wear clean socks or stockings every day. Make sure they are not too tight. Do not wear knee-high stockings since they may decrease blood flow to your legs.   Wear shoes that fit properly and have enough cushioning. Always look in your shoes before you put them on to be sure there are no objects inside.   To break in new shoes, wear them for just a few hours a day. This prevents injuries on your feet.  Wounds, scrapes, corns, and calluses   Check your feet daily for blisters, cuts, bruises, sores, and redness. If you cannot see the bottom of your feet, use a mirror or ask someone for help.   Do not cut corns or calluses or try to remove them with medicine.   If you  find a minor scrape, cut, or break in the skin on your feet, keep it and the skin around it clean and dry. You may clean these areas with mild soap and water. Do not clean the area with peroxide, alcohol, or iodine.   If you have a wound, scrape, corn, or callus on your foot, look at it several times a day to make sure it is healing and not infected. Check for:  ? Redness, swelling, or pain.  ? Fluid or blood.  ? Warmth.  ? Pus or a bad smell.  General instructions   Do not cross your legs. This may decrease blood flow to your feet.   Do not use heating pads or hot water bottles on your feet. They may burn your skin. If you have lost feeling in your feet or legs, you may not know this is happening until it is too late.   Protect your feet from hot and cold by wearing shoes, such as at the beach or on hot pavement.   Schedule a complete foot exam at least once a year (annually) or more often if you have foot problems. If you have foot problems, report any cuts, sores, or bruises to your health care provider immediately.  Contact a health care provider if:     You have a medical condition that increases your risk of infection and you have any cuts, sores, or bruises on your feet.   You have an injury that is not healing.   You have redness on your legs or feet.   You feel burning or tingling in your legs or feet.   You have pain or cramps in your legs and feet.   Your legs or feet are numb.   Your feet always feel cold.   You have pain around a toenail.  Get help right away if:   You have a wound, scrape, corn, or callus on your foot and:  ? You have pain, swelling, or redness that gets worse.  ? You have fluid or blood coming from the wound, scrape, corn, or callus.  ? Your wound, scrape, corn, or callus feels warm to the touch.  ? You have pus or a bad smell coming from the wound, scrape, corn, or callus.  ? You have a fever.  ? You have a red line going up your leg.  Summary   Check your feet every day  for cuts, sores, red spots, swelling, and blisters.   Moisturize feet and legs daily.   Wear shoes that fit properly and have enough cushioning.   If you have foot problems, report any cuts, sores, or bruises to your health care provider immediately.   Schedule a complete foot exam at least once a year (annually) or more often if you have foot problems.  This information is not intended to replace advice given to you by your health care provider. Make sure you discuss any questions you have with your health care provider.  Document Released: 11/15/2000 Document Revised: 12/31/2017 Document Reviewed: 12/20/2016  [employer]vier Interactive Patient Education  2019 [employer]

## 2018-11-19 NOTE — Progress Notes (Signed)
   Subjective:    Patient ID: Brenda Arnold, female    DOB: 1956/11/17, 62 y.o.   MRN: [id number]  HPI    Review of Systems  All other systems reviewed and are negative.      Objective:   Physical Exam        Assessment & Plan:

## 2018-11-20 DIAGNOSIS — N186 End stage renal disease: Secondary | ICD-10-CM | POA: Diagnosis not present

## 2018-11-20 DIAGNOSIS — N2581 Secondary hyperparathyroidism of renal origin: Secondary | ICD-10-CM | POA: Diagnosis not present

## 2018-11-21 NOTE — Progress Notes (Signed)
Subjective:   Patient ID: Brenda Arnold, female   DOB: 62 y.o.   MRN: [id number]   HPI patient presents stating she is having a lot of problems with the nails 1-5 both feet and a lesion underneath the fifth metatarsal left is very tender and that she does have relatively poor health cannot reach her feet and is taking a blood thinner.  Patient does not smoke currently and likes to be active     Review of Systems  All other systems reviewed and are negative.       Objective:  Physical Exam Vitals signs and nursing note reviewed.  Constitutional:      Appearance: She is well-developed.  Pulmonary:     Effort: Pulmonary effort is normal.  Musculoskeletal: Normal range of motion.  Skin:    General: Skin is warm.  Neurological:     Mental Status: She is alert.     Neurovascular status was found to be intact with patient having minimal symptoms associated with her diabetes but has thick yellow brittle nailbeds 1-5 both feet that are painful and significant keratotic lesion sub-fifth metatarsal left that is painful with nucleated core.  She is on it blood thinner which is high risk for this patient     Assessment:  Chronic mycotic nail infection with pain 1-5 both feet with lesion sub-fifth metatarsal left that is painful when palpated     Plan:  H&P condition reviewed and nail debridement 1-5 both feet accomplished with no iatrogenic bleeding and debrided lesion left fifth metatarsal in a sharp fashion with no iatrogenic bleeding and reappoint for routine care to have both of these worked up.  Gave instructions on diabetic foot care and daily inspections

## 2018-11-22 DIAGNOSIS — N186 End stage renal disease: Secondary | ICD-10-CM | POA: Diagnosis not present

## 2018-11-22 DIAGNOSIS — N2581 Secondary hyperparathyroidism of renal origin: Secondary | ICD-10-CM | POA: Diagnosis not present

## 2018-11-23 DIAGNOSIS — Z7901 Long term (current) use of anticoagulants: Secondary | ICD-10-CM | POA: Diagnosis not present

## 2018-11-24 DIAGNOSIS — N186 End stage renal disease: Secondary | ICD-10-CM | POA: Diagnosis not present

## 2018-11-24 DIAGNOSIS — N2581 Secondary hyperparathyroidism of renal origin: Secondary | ICD-10-CM | POA: Diagnosis not present

## 2018-11-27 DIAGNOSIS — N186 End stage renal disease: Secondary | ICD-10-CM | POA: Diagnosis not present

## 2018-11-27 DIAGNOSIS — N2581 Secondary hyperparathyroidism of renal origin: Secondary | ICD-10-CM | POA: Diagnosis not present

## 2018-11-29 DIAGNOSIS — N2581 Secondary hyperparathyroidism of renal origin: Secondary | ICD-10-CM | POA: Diagnosis not present

## 2018-11-29 DIAGNOSIS — N186 End stage renal disease: Secondary | ICD-10-CM | POA: Diagnosis not present

## 2018-12-01 DIAGNOSIS — E1122 Type 2 diabetes mellitus with diabetic chronic kidney disease: Secondary | ICD-10-CM | POA: Diagnosis not present

## 2018-12-01 DIAGNOSIS — N186 End stage renal disease: Secondary | ICD-10-CM | POA: Diagnosis not present

## 2018-12-01 DIAGNOSIS — N2581 Secondary hyperparathyroidism of renal origin: Secondary | ICD-10-CM | POA: Diagnosis not present

## 2018-12-01 DIAGNOSIS — Z992 Dependence on renal dialysis: Secondary | ICD-10-CM | POA: Diagnosis not present

## 2018-12-04 DIAGNOSIS — N2581 Secondary hyperparathyroidism of renal origin: Secondary | ICD-10-CM | POA: Diagnosis not present

## 2018-12-04 DIAGNOSIS — N186 End stage renal disease: Secondary | ICD-10-CM | POA: Diagnosis not present

## 2018-12-07 DIAGNOSIS — N186 End stage renal disease: Secondary | ICD-10-CM | POA: Diagnosis not present

## 2018-12-07 DIAGNOSIS — L299 Pruritus, unspecified: Secondary | ICD-10-CM | POA: Diagnosis not present

## 2018-12-07 DIAGNOSIS — N2581 Secondary hyperparathyroidism of renal origin: Secondary | ICD-10-CM | POA: Diagnosis not present

## 2018-12-08 ENCOUNTER — Encounter (INDEPENDENT_AMBULATORY_CARE_PROVIDER_SITE_OTHER): Payer: Self-pay

## 2018-12-08 ENCOUNTER — Telehealth (INDEPENDENT_AMBULATORY_CARE_PROVIDER_SITE_OTHER): Payer: Self-pay | Admitting: Orthopaedic Surgery

## 2018-12-08 NOTE — Telephone Encounter (Signed)
Patient called back asked if the note to return back to work be for today instead of tomorrow. See previous note

## 2018-12-08 NOTE — Telephone Encounter (Signed)
See message. Okay for her to RTW today?

## 2018-12-08 NOTE — Telephone Encounter (Signed)
yes

## 2018-12-08 NOTE — Telephone Encounter (Signed)
Patient called asked if she can get a note for her employer releasing her back to work without restrictions tomorrow. The number to contact patient is [phone]

## 2018-12-08 NOTE — Telephone Encounter (Signed)
See other message

## 2018-12-08 NOTE — Telephone Encounter (Signed)
Patient called back asked if the note to return back to work be for today instead of tomorrow. See previous note. Per Joycelyn Schmid.

## 2018-12-08 NOTE — Telephone Encounter (Signed)
Note made. Ready for pick up.

## 2018-12-09 DIAGNOSIS — L299 Pruritus, unspecified: Secondary | ICD-10-CM | POA: Diagnosis not present

## 2018-12-09 DIAGNOSIS — N186 End stage renal disease: Secondary | ICD-10-CM | POA: Diagnosis not present

## 2018-12-09 DIAGNOSIS — N2581 Secondary hyperparathyroidism of renal origin: Secondary | ICD-10-CM | POA: Diagnosis not present

## 2018-12-11 DIAGNOSIS — N2581 Secondary hyperparathyroidism of renal origin: Secondary | ICD-10-CM | POA: Diagnosis not present

## 2018-12-11 DIAGNOSIS — L299 Pruritus, unspecified: Secondary | ICD-10-CM | POA: Diagnosis not present

## 2018-12-11 DIAGNOSIS — N186 End stage renal disease: Secondary | ICD-10-CM | POA: Diagnosis not present

## 2018-12-14 DIAGNOSIS — N2581 Secondary hyperparathyroidism of renal origin: Secondary | ICD-10-CM | POA: Diagnosis not present

## 2018-12-14 DIAGNOSIS — L299 Pruritus, unspecified: Secondary | ICD-10-CM | POA: Diagnosis not present

## 2018-12-14 DIAGNOSIS — N186 End stage renal disease: Secondary | ICD-10-CM | POA: Diagnosis not present

## 2018-12-16 DIAGNOSIS — L299 Pruritus, unspecified: Secondary | ICD-10-CM | POA: Diagnosis not present

## 2018-12-16 DIAGNOSIS — N2581 Secondary hyperparathyroidism of renal origin: Secondary | ICD-10-CM | POA: Diagnosis not present

## 2018-12-16 DIAGNOSIS — N186 End stage renal disease: Secondary | ICD-10-CM | POA: Diagnosis not present

## 2018-12-18 DIAGNOSIS — L299 Pruritus, unspecified: Secondary | ICD-10-CM | POA: Diagnosis not present

## 2018-12-18 DIAGNOSIS — N186 End stage renal disease: Secondary | ICD-10-CM | POA: Diagnosis not present

## 2018-12-18 DIAGNOSIS — N2581 Secondary hyperparathyroidism of renal origin: Secondary | ICD-10-CM | POA: Diagnosis not present

## 2018-12-21 DIAGNOSIS — L299 Pruritus, unspecified: Secondary | ICD-10-CM | POA: Diagnosis not present

## 2018-12-21 DIAGNOSIS — N186 End stage renal disease: Secondary | ICD-10-CM | POA: Diagnosis not present

## 2018-12-21 DIAGNOSIS — N2581 Secondary hyperparathyroidism of renal origin: Secondary | ICD-10-CM | POA: Diagnosis not present

## 2018-12-22 DIAGNOSIS — L301 Dyshidrosis [pompholyx]: Secondary | ICD-10-CM | POA: Diagnosis not present

## 2018-12-22 DIAGNOSIS — I8310 Varicose veins of unspecified lower extremity with inflammation: Secondary | ICD-10-CM | POA: Diagnosis not present

## 2018-12-22 DIAGNOSIS — N185 Chronic kidney disease, stage 5: Secondary | ICD-10-CM | POA: Diagnosis not present

## 2018-12-23 DIAGNOSIS — N186 End stage renal disease: Secondary | ICD-10-CM | POA: Diagnosis not present

## 2018-12-23 DIAGNOSIS — L299 Pruritus, unspecified: Secondary | ICD-10-CM | POA: Diagnosis not present

## 2018-12-23 DIAGNOSIS — N2581 Secondary hyperparathyroidism of renal origin: Secondary | ICD-10-CM | POA: Diagnosis not present

## 2018-12-25 DIAGNOSIS — N186 End stage renal disease: Secondary | ICD-10-CM | POA: Diagnosis not present

## 2018-12-25 DIAGNOSIS — L299 Pruritus, unspecified: Secondary | ICD-10-CM | POA: Diagnosis not present

## 2018-12-25 DIAGNOSIS — N2581 Secondary hyperparathyroidism of renal origin: Secondary | ICD-10-CM | POA: Diagnosis not present

## 2018-12-28 DIAGNOSIS — N2581 Secondary hyperparathyroidism of renal origin: Secondary | ICD-10-CM | POA: Diagnosis not present

## 2018-12-28 DIAGNOSIS — N186 End stage renal disease: Secondary | ICD-10-CM | POA: Diagnosis not present

## 2018-12-28 DIAGNOSIS — L299 Pruritus, unspecified: Secondary | ICD-10-CM | POA: Diagnosis not present

## 2018-12-30 DIAGNOSIS — N2581 Secondary hyperparathyroidism of renal origin: Secondary | ICD-10-CM | POA: Diagnosis not present

## 2018-12-30 DIAGNOSIS — L299 Pruritus, unspecified: Secondary | ICD-10-CM | POA: Diagnosis not present

## 2018-12-30 DIAGNOSIS — N186 End stage renal disease: Secondary | ICD-10-CM | POA: Diagnosis not present

## 2019-01-01 DIAGNOSIS — N2581 Secondary hyperparathyroidism of renal origin: Secondary | ICD-10-CM | POA: Diagnosis not present

## 2019-01-01 DIAGNOSIS — N186 End stage renal disease: Secondary | ICD-10-CM | POA: Diagnosis not present

## 2019-01-01 DIAGNOSIS — L299 Pruritus, unspecified: Secondary | ICD-10-CM | POA: Diagnosis not present

## 2019-01-01 DIAGNOSIS — Z992 Dependence on renal dialysis: Secondary | ICD-10-CM | POA: Diagnosis not present

## 2019-01-01 DIAGNOSIS — E1122 Type 2 diabetes mellitus with diabetic chronic kidney disease: Secondary | ICD-10-CM | POA: Diagnosis not present

## 2019-01-04 DIAGNOSIS — N186 End stage renal disease: Secondary | ICD-10-CM | POA: Diagnosis not present

## 2019-01-04 DIAGNOSIS — N2581 Secondary hyperparathyroidism of renal origin: Secondary | ICD-10-CM | POA: Diagnosis not present

## 2019-01-06 DIAGNOSIS — N2581 Secondary hyperparathyroidism of renal origin: Secondary | ICD-10-CM | POA: Diagnosis not present

## 2019-01-06 DIAGNOSIS — N186 End stage renal disease: Secondary | ICD-10-CM | POA: Diagnosis not present

## 2019-01-08 DIAGNOSIS — N2581 Secondary hyperparathyroidism of renal origin: Secondary | ICD-10-CM | POA: Diagnosis not present

## 2019-01-08 DIAGNOSIS — N186 End stage renal disease: Secondary | ICD-10-CM | POA: Diagnosis not present

## 2019-01-11 DIAGNOSIS — N186 End stage renal disease: Secondary | ICD-10-CM | POA: Diagnosis not present

## 2019-01-11 DIAGNOSIS — N2581 Secondary hyperparathyroidism of renal origin: Secondary | ICD-10-CM | POA: Diagnosis not present

## 2019-01-11 DIAGNOSIS — L299 Pruritus, unspecified: Secondary | ICD-10-CM | POA: Diagnosis not present

## 2019-01-13 DIAGNOSIS — L299 Pruritus, unspecified: Secondary | ICD-10-CM | POA: Diagnosis not present

## 2019-01-13 DIAGNOSIS — N186 End stage renal disease: Secondary | ICD-10-CM | POA: Diagnosis not present

## 2019-01-13 DIAGNOSIS — N2581 Secondary hyperparathyroidism of renal origin: Secondary | ICD-10-CM | POA: Diagnosis not present

## 2019-01-15 DIAGNOSIS — N2581 Secondary hyperparathyroidism of renal origin: Secondary | ICD-10-CM | POA: Diagnosis not present

## 2019-01-15 DIAGNOSIS — L299 Pruritus, unspecified: Secondary | ICD-10-CM | POA: Diagnosis not present

## 2019-01-15 DIAGNOSIS — N186 End stage renal disease: Secondary | ICD-10-CM | POA: Diagnosis not present

## 2019-01-18 DIAGNOSIS — L299 Pruritus, unspecified: Secondary | ICD-10-CM | POA: Diagnosis not present

## 2019-01-18 DIAGNOSIS — N186 End stage renal disease: Secondary | ICD-10-CM | POA: Diagnosis not present

## 2019-01-18 DIAGNOSIS — N2581 Secondary hyperparathyroidism of renal origin: Secondary | ICD-10-CM | POA: Diagnosis not present

## 2019-01-19 DIAGNOSIS — M722 Plantar fascial fibromatosis: Secondary | ICD-10-CM | POA: Diagnosis not present

## 2019-01-19 DIAGNOSIS — N185 Chronic kidney disease, stage 5: Secondary | ICD-10-CM | POA: Diagnosis not present

## 2019-01-19 DIAGNOSIS — E1121 Type 2 diabetes mellitus with diabetic nephropathy: Secondary | ICD-10-CM | POA: Diagnosis not present

## 2019-01-19 DIAGNOSIS — I129 Hypertensive chronic kidney disease with stage 1 through stage 4 chronic kidney disease, or unspecified chronic kidney disease: Secondary | ICD-10-CM | POA: Diagnosis not present

## 2019-01-20 DIAGNOSIS — N186 End stage renal disease: Secondary | ICD-10-CM | POA: Diagnosis not present

## 2019-01-20 DIAGNOSIS — L299 Pruritus, unspecified: Secondary | ICD-10-CM | POA: Diagnosis not present

## 2019-01-20 DIAGNOSIS — N2581 Secondary hyperparathyroidism of renal origin: Secondary | ICD-10-CM | POA: Diagnosis not present

## 2019-01-22 DIAGNOSIS — L299 Pruritus, unspecified: Secondary | ICD-10-CM | POA: Diagnosis not present

## 2019-01-22 DIAGNOSIS — N186 End stage renal disease: Secondary | ICD-10-CM | POA: Diagnosis not present

## 2019-01-22 DIAGNOSIS — N2581 Secondary hyperparathyroidism of renal origin: Secondary | ICD-10-CM | POA: Diagnosis not present

## 2019-01-25 DIAGNOSIS — L299 Pruritus, unspecified: Secondary | ICD-10-CM | POA: Diagnosis not present

## 2019-01-25 DIAGNOSIS — N2581 Secondary hyperparathyroidism of renal origin: Secondary | ICD-10-CM | POA: Diagnosis not present

## 2019-01-25 DIAGNOSIS — N186 End stage renal disease: Secondary | ICD-10-CM | POA: Diagnosis not present

## 2019-01-25 DIAGNOSIS — L509 Urticaria, unspecified: Secondary | ICD-10-CM | POA: Diagnosis not present

## 2019-01-27 DIAGNOSIS — N186 End stage renal disease: Secondary | ICD-10-CM | POA: Diagnosis not present

## 2019-01-27 DIAGNOSIS — N2581 Secondary hyperparathyroidism of renal origin: Secondary | ICD-10-CM | POA: Diagnosis not present

## 2019-01-27 DIAGNOSIS — L299 Pruritus, unspecified: Secondary | ICD-10-CM | POA: Diagnosis not present

## 2019-01-29 DIAGNOSIS — N2581 Secondary hyperparathyroidism of renal origin: Secondary | ICD-10-CM | POA: Diagnosis not present

## 2019-01-29 DIAGNOSIS — N186 End stage renal disease: Secondary | ICD-10-CM | POA: Diagnosis not present

## 2019-01-29 DIAGNOSIS — L299 Pruritus, unspecified: Secondary | ICD-10-CM | POA: Diagnosis not present

## 2019-01-30 DIAGNOSIS — E1122 Type 2 diabetes mellitus with diabetic chronic kidney disease: Secondary | ICD-10-CM | POA: Diagnosis not present

## 2019-01-30 DIAGNOSIS — Z992 Dependence on renal dialysis: Secondary | ICD-10-CM | POA: Diagnosis not present

## 2019-01-30 DIAGNOSIS — N186 End stage renal disease: Secondary | ICD-10-CM | POA: Diagnosis not present

## 2019-02-01 DIAGNOSIS — N186 End stage renal disease: Secondary | ICD-10-CM | POA: Diagnosis not present

## 2019-02-01 DIAGNOSIS — L299 Pruritus, unspecified: Secondary | ICD-10-CM | POA: Diagnosis not present

## 2019-02-01 DIAGNOSIS — N2581 Secondary hyperparathyroidism of renal origin: Secondary | ICD-10-CM | POA: Diagnosis not present

## 2019-02-03 DIAGNOSIS — L299 Pruritus, unspecified: Secondary | ICD-10-CM | POA: Diagnosis not present

## 2019-02-03 DIAGNOSIS — N186 End stage renal disease: Secondary | ICD-10-CM | POA: Diagnosis not present

## 2019-02-03 DIAGNOSIS — N2581 Secondary hyperparathyroidism of renal origin: Secondary | ICD-10-CM | POA: Diagnosis not present

## 2019-02-05 DIAGNOSIS — L299 Pruritus, unspecified: Secondary | ICD-10-CM | POA: Diagnosis not present

## 2019-02-05 DIAGNOSIS — N2581 Secondary hyperparathyroidism of renal origin: Secondary | ICD-10-CM | POA: Diagnosis not present

## 2019-02-05 DIAGNOSIS — N186 End stage renal disease: Secondary | ICD-10-CM | POA: Diagnosis not present

## 2019-02-08 DIAGNOSIS — N2581 Secondary hyperparathyroidism of renal origin: Secondary | ICD-10-CM | POA: Diagnosis not present

## 2019-02-08 DIAGNOSIS — N186 End stage renal disease: Secondary | ICD-10-CM | POA: Diagnosis not present

## 2019-02-10 DIAGNOSIS — N2581 Secondary hyperparathyroidism of renal origin: Secondary | ICD-10-CM | POA: Diagnosis not present

## 2019-02-10 DIAGNOSIS — N186 End stage renal disease: Secondary | ICD-10-CM | POA: Diagnosis not present

## 2019-02-12 DIAGNOSIS — N186 End stage renal disease: Secondary | ICD-10-CM | POA: Diagnosis not present

## 2019-02-12 DIAGNOSIS — N2581 Secondary hyperparathyroidism of renal origin: Secondary | ICD-10-CM | POA: Diagnosis not present

## 2019-02-15 DIAGNOSIS — R52 Pain, unspecified: Secondary | ICD-10-CM | POA: Diagnosis not present

## 2019-02-15 DIAGNOSIS — N2581 Secondary hyperparathyroidism of renal origin: Secondary | ICD-10-CM | POA: Diagnosis not present

## 2019-02-15 DIAGNOSIS — N186 End stage renal disease: Secondary | ICD-10-CM | POA: Diagnosis not present

## 2019-02-17 DIAGNOSIS — N2581 Secondary hyperparathyroidism of renal origin: Secondary | ICD-10-CM | POA: Diagnosis not present

## 2019-02-17 DIAGNOSIS — R52 Pain, unspecified: Secondary | ICD-10-CM | POA: Diagnosis not present

## 2019-02-17 DIAGNOSIS — N186 End stage renal disease: Secondary | ICD-10-CM | POA: Diagnosis not present

## 2019-02-18 ENCOUNTER — Ambulatory Visit (INDEPENDENT_AMBULATORY_CARE_PROVIDER_SITE_OTHER): Payer: BLUE CROSS/BLUE SHIELD | Admitting: Podiatry

## 2019-02-18 ENCOUNTER — Other Ambulatory Visit: Payer: Self-pay

## 2019-02-18 DIAGNOSIS — Z7901 Long term (current) use of anticoagulants: Secondary | ICD-10-CM

## 2019-02-18 DIAGNOSIS — B351 Tinea unguium: Secondary | ICD-10-CM

## 2019-02-18 DIAGNOSIS — M79674 Pain in right toe(s): Secondary | ICD-10-CM

## 2019-02-18 DIAGNOSIS — E119 Type 2 diabetes mellitus without complications: Secondary | ICD-10-CM | POA: Diagnosis not present

## 2019-02-18 DIAGNOSIS — L84 Corns and callosities: Secondary | ICD-10-CM | POA: Diagnosis not present

## 2019-02-18 DIAGNOSIS — Z9229 Personal history of other drug therapy: Secondary | ICD-10-CM | POA: Diagnosis not present

## 2019-02-18 DIAGNOSIS — M79675 Pain in left toe(s): Secondary | ICD-10-CM | POA: Diagnosis not present

## 2019-02-18 DIAGNOSIS — R5381 Other malaise: Secondary | ICD-10-CM | POA: Diagnosis not present

## 2019-02-18 NOTE — Patient Instructions (Signed)
Diabetic Neuropathy Diabetic neuropathy refers to nerve damage that is caused by diabetes (diabetes mellitus). Over time, people with diabetes can develop nerve damage throughout the body. There are several types of diabetic neuropathy:  Peripheral neuropathy. This is the most common type of diabetic neuropathy. It causes damage to nerves that carry signals between the spinal cord and other parts of the body (peripheral nerves). This usually affects nerves in the feet and legs first, and may eventually affect the hands and arms. The damage affects the ability to sense touch or temperature.  Autonomic neuropathy. This type causes damage to nerves that control involuntary functions (autonomic nerves). These nerves carry signals that control: ? Heartbeat. ? Body temperature. ? Blood pressure. ? Urination. ? Digestion. ? Sweating. ? Sexual function. ? Response to changing blood sugar (glucose) levels.  Focal neuropathy. This type of nerve damage affects one area of the body, such as an arm, a leg, or the face. The injury may involve one nerve or a small group of nerves. Focal neuropathy can be painful and unpredictable, and occurs most often in older adults with diabetes. This often develops suddenly, but usually improves over time and does not cause long-term problems.  Proximal neuropathy. This type of nerve damage affects the nerves of the thighs, hips, buttocks, or legs. It causes severe pain, weakness, and muscle death (atrophy), usually in the thigh muscles. It is more common among older men and people who have type 2 diabetes. The length of recovery time may vary. What are the causes? Peripheral, autonomic, and focal neuropathies are caused by diabetes that is not well controlled with treatment. The cause of proximal neuropathy is not known, but it may be caused by inflammation related to uncontrolled blood glucose levels. What are the signs or symptoms? Peripheral neuropathy Peripheral  neuropathy develops slowly over time. When the nerves of the feet and legs no longer work, you may experience:  Burning, stabbing, or aching pain in the legs or feet.  Pain or cramping in the legs or feet.  Loss of feeling (numbness) and inability to feel pressure or pain in the feet. This can lead to: ? Thick calluses or sores on areas of constant pressure. ? Ulcers. ? Reduced ability to feel temperature changes.  Foot deformities.  Muscle weakness.  Loss of balance or coordination. Autonomic neuropathy The symptoms of autonomic neuropathy vary depending on which nerves are affected. Symptoms may include:  Problems with digestion, such as: ? Nausea or vomiting. ? Poor appetite. ? Bloating. ? Diarrhea or constipation. ? Trouble swallowing. ? Losing weight without trying to.  Problems with the heart, blood and lungs, such as: ? Dizziness, especially when standing up. ? Fainting. ? Shortness of breath. ? Irregular heartbeat.  Bladder problems, such as: ? Trouble starting or stopping urination. ? Leaking urine. ? Trouble emptying the bladder. ? Urinary tract infections (UTIs).  Problems with other body functions, such as: ? Sweat. You may sweat too much or too little. ? Temperature. You might get hot easily. Or, you might feel cold more than usual. ? Sexual function. Men may not be able to get or maintain an erection. Women may have vaginal dryness and difficulty with arousal. Focal neuropathy Symptoms affect only one area of the body. Common symptoms include:  Numbness.  Tingling.  Burning pain.  Prickling feeling.  Very sensitive skin.  Weakness.  Inability to move (paralysis).  Muscle twitching.  Muscles getting smaller (wasting).  Poor coordination.  Double or blurred vision. Proximal   neuropathy  Sudden, severe pain in the hip, thigh, or buttocks. Pain may spread from the back into the legs (sciatica).  Pain and numbness in the arms and legs.   Tingling.  Loss of bladder control or bowel control.  Weakness and wasting of thigh muscles.  Difficulty getting up from a seated position.  Abdominal swelling.  Unexplained weight loss. How is this diagnosed? Diagnosis usually involves reviewing your medical history and any symptoms you have. Diagnosis varies depending on the type of neuropathy your health care provider suspects. Peripheral neuropathy Your health care provider will check areas that are affected by your nervous system (neurologic exam), such as your reflexes, how you move, and what you can feel. You may have other tests, such as:  Blood tests.  Removal and examination of fluid that surrounds the spinal cord (lumbar puncture).  CT scan.  MRI.  A test to check the nerves that control muscles (electromyogram, EMG).  Tests of how quickly messages pass through your nerves (nerve conduction velocity tests).  Removal of a small piece of nerve to be examined under a microscope (biopsy). Autonomic neuropathy You may have tests, such as:  Tests to measure your blood pressure and heart rate. This may include monitoring you while you are safely secured to an exam table that moves you from a lying position to an upright position (table tilt test).  Breathing tests to check your lungs.  Tests to check how food moves through the digestive system (gastric emptying tests).  Blood, sweat, or urine tests.  Ultrasound of your bladder.  Spinal fluid tests. Focal neuropathy This condition may be diagnosed with:  A neurologic exam.  CT scan.  MRI.  EMG.  Nerve conduction velocity tests. Proximal neuropathy There is no test to diagnose this type of neuropathy. You may have tests to rule out other possible causes of this type of neuropathy. Tests may include:  X-rays of your spine and lumbar region.  Lumbar puncture.  MRI. How is this treated? The goal of treatment is to keep nerve damage from getting worse.  The most important part of treatment is keeping your blood glucose level and your A1C level within your target range by following your diabetes management plan. Over time, maintaining lower blood glucose levels helps lessen symptoms. In some cases, you may need prescription pain medicine. Follow these instructions at home:  Lifestyle   Do not use any products that contain nicotine or tobacco, such as cigarettes and e-cigarettes. If you need help quitting, ask your health care provider.  Be physically active every day. Include strength training and balance exercises.  Follow a healthy meal plan.  Work with your health care provider to manage your blood pressure. General instructions  Follow your diabetes management plan as directed. ? Check your blood glucose levels as directed by your health care provider. ? Keep your blood glucose in your target range as directed by your health care provider. ? Have your A1C level checked at least two times a year, or as often as told by your health care provider.  Take over the counter and prescription medicines only as told by your health care provider. This includes insulin and diabetes medicine.  Do not drive or use heavy machinery while taking prescription pain medicines.  Check your skin and feet every day for cuts, bruises, redness, blisters, or sores.  Keep all follow up visits as told by your health care provider. This is important. Contact a health care provider if:    You have burning, stabbing, or aching pain in your legs or feet.  You are unable to feel pressure or pain in your feet.  You develop problems with digestion, such as: ? Nausea. ? Vomiting. ? Bloating. ? Constipation. ? Diarrhea. ? Abdominal pain.  You have difficulty with urination, such as inability: ? To control when you urinate (incontinence). ? To completely empty the bladder (retention).  You have palpitations.  You feel dizzy, weak, or faint when you stand  up. Get help right away if:  You cannot urinate.  You have sudden weakness or loss of coordination.  You have trouble speaking.  You have pain or pressure in your chest.  You have an irregular heart beat.  You have sudden inability to move a part of your body. Summary  Diabetic neuropathy refers to nerve damage that is caused by diabetes. It can affect nerves throughout the entire body, causing numbness and pain in the arms, legs, digestive tract, heart, and other body systems.  Keep your blood glucose level and your blood pressure in your target range, as directed by your health care provider. This can help prevent neuropathy from getting worse.  Check your skin and feet every day for cuts, bruises, redness, blisters, or sores.  Do not use any products that contain nicotine or tobacco, such as cigarettes and e-cigarettes. If you need help quitting, ask your health care provider. This information is not intended to replace advice given to you by your health care provider. Make sure you discuss any questions you have with your health care provider. Document Released: 01/27/2002 Document Revised: 12/31/2017 Document Reviewed: 12/23/2016 Elsevier Interactive Patient Education  2019 [employer]  Onychomycosis/Fungal Toenails  WHAT IS IT? An infection that lies within the keratin of your nail plate that is caused by a fungus.  WHY ME? Fungal infections affect all ages, sexes, races, and creeds.  There may be many factors that predispose you to a fungal infection such as age, coexisting medical conditions such as diabetes, or an autoimmune disease; stress, medications, fatigue, genetics, etc.  Bottom line: fungus thrives in a warm, moist environment and your shoes offer such a location.  IS IT CONTAGIOUS? Theoretically, yes.  You do not want to share shoes, nail clippers or files with someone who has fungal toenails.  Walking around barefoot in the same room or sleeping in the same bed  is unlikely to transfer the organism.  It is important to realize, however, that fungus can spread easily from one nail to the next on the same foot.  HOW DO WE TREAT THIS?  There are several ways to treat this condition.  Treatment may depend on many factors such as age, medications, pregnancy, liver and kidney conditions, etc.  It is best to ask your doctor which options are available to you.  1. No treatment.   Unlike many other medical concerns, you can live with this condition.  However for many people this can be a painful condition and may lead to ingrown toenails or a bacterial infection.  It is recommended that you keep the nails cut short to help reduce the amount of fungal nail. 2. Topical treatment.  These range from herbal remedies to prescription strength nail lacquers.  About 40-50% effective, topicals require twice daily application for approximately 9 to 12 months or until an entirely new nail has grown out.  The most effective topicals are medical grade medications available through physicians offices. 3. Oral antifungal medications.  With an 80-90%  cure rate, the most common oral medication requires 3 to 4 months of therapy and stays in your system for a year as the new nail grows out.  Oral antifungal medications do require blood work to make sure it is a safe drug for you.  A liver function panel will be performed prior to starting the medication and after the first month of treatment.  It is important to have the blood work performed to avoid any harmful side effects.  In general, this medication safe but blood work is required. 4. Laser Therapy.  This treatment is performed by applying a specialized laser to the affected nail plate.  This therapy is noninvasive, fast, and non-painful.  It is not covered by insurance and is therefore, out of pocket.  The results have been very good with a 80-95% cure rate.  [facility] is the only practice in the area to offer this therapy. 5.  Permanent Nail Avulsion.  Removing the entire nail so that a new nail will not grow back.

## 2019-02-19 DIAGNOSIS — N2581 Secondary hyperparathyroidism of renal origin: Secondary | ICD-10-CM | POA: Diagnosis not present

## 2019-02-19 DIAGNOSIS — N186 End stage renal disease: Secondary | ICD-10-CM | POA: Diagnosis not present

## 2019-02-19 DIAGNOSIS — R52 Pain, unspecified: Secondary | ICD-10-CM | POA: Diagnosis not present

## 2019-02-22 DIAGNOSIS — N2581 Secondary hyperparathyroidism of renal origin: Secondary | ICD-10-CM | POA: Diagnosis not present

## 2019-02-22 DIAGNOSIS — N186 End stage renal disease: Secondary | ICD-10-CM | POA: Diagnosis not present

## 2019-02-24 DIAGNOSIS — N186 End stage renal disease: Secondary | ICD-10-CM | POA: Diagnosis not present

## 2019-02-24 DIAGNOSIS — N2581 Secondary hyperparathyroidism of renal origin: Secondary | ICD-10-CM | POA: Diagnosis not present

## 2019-02-25 ENCOUNTER — Encounter: Payer: Self-pay | Admitting: Podiatry

## 2019-02-25 NOTE — Progress Notes (Signed)
Subjective: Brenda Arnold is a 63 y.o. y.o. female who is on long term blood thinner Coumadin is seen for painful, mycotic toenails.  Pain is aggravated when wearing enclosed shoe gear. Pain is relieved with periodic professional debridement.  Pt also presents with painful callus formation noted submetatarsal head 5 b/l.  Patient states she feels weird burning/itching in her feet when her blood pressure drops. She is on hemodialysis.   Kelton Pillar, MD is her PCP.   Current Outpatient Medications:  .  aspirin EC 81 MG tablet, Take 1 tablet (81 mg total) by mouth 2 (two) times daily., Disp: 84 tablet, Rfl: 0 .  atorvastatin (LIPITOR) 40 MG tablet, Take 1 tablet (40 mg total) by mouth daily at 6 PM., Disp: 30 tablet, Rfl: 1 .  B Complex-C-Folic Acid (DIALYVITE TABLET) TABS, Take 1 tablet by mouth at bedtime. , Disp: , Rfl: 3 .  calcitRIOL (ROCALTROL) 0.5 MCG capsule, Take 3 capsules (1.5 mcg total) by mouth 3 (three) times a week., Disp: 90 capsule, Rfl: 0 .  ELIQUIS 5 MG TABS tablet, , Disp: , Rfl:  .  ethyl chloride spray, Apply 1 application topically See admin instructions. For dialysis, Disp: , Rfl: 6 .  gabapentin (NEURONTIN) 100 MG capsule, Take 100 mg by mouth at bedtime., Disp: , Rfl: 3 .  methocarbamol (ROBAXIN) 500 MG tablet, Take 1 tablet (500 mg total) by mouth every 6 (six) hours as needed for muscle spasms., Disp: 30 tablet, Rfl: 2 .  midodrine (PROAMATINE) 5 MG tablet, , Disp: , Rfl:  .  ondansetron (ZOFRAN) 4 MG tablet, Take 1-2 tablets (4-8 mg total) by mouth every 8 (eight) hours as needed for nausea or vomiting., Disp: 40 tablet, Rfl: 0 .  oxyCODONE (OXY IR/ROXICODONE) 5 MG immediate release tablet, Take 1-3 tablets (5-15 mg total) by mouth every 4 (four) hours as needed., Disp: 30 tablet, Rfl: 0 .  predniSONE (DELTASONE) 20 MG tablet, TAKE 1 TABLET 2 TIMES PER DAY FOR 3 DAYS, 1 TIME PER DAY FOR 3 DAYS ORALLY, Disp: , Rfl:  .  promethazine (PHENERGAN) 25 MG tablet,  Take 1 tablet (25 mg total) by mouth every 6 (six) hours as needed for nausea., Disp: 30 tablet, Rfl: 1 .  senna-docusate (SENOKOT S) 8.6-50 MG tablet, Take 1 tablet by mouth at bedtime as needed., Disp: 30 tablet, Rfl: 1 .  sucroferric oxyhydroxide (VELPHORO) 500 MG chewable tablet, Chew 2 capsules by mouth See admin instructions. 2 each three times daily with meals, and 2 for 1 daily snack, Disp: , Rfl:  .  warfarin (COUMADIN) 7.5 MG tablet, Take 1 tablet (7.5 mg total) by mouth daily. (Patient not taking: Reported on 02/18/2019), Disp: 30 tablet, Rfl: 11   Allergies  Allergen Reactions  . Iodine Shortness Of Breath  . Shellfish Allergy Shortness Of Breath  . Norvasc [Amlodipine] Swelling    SWELLING REACTION UNSPECIFIED      Objective: Vascular Examination: Capillary refill time immediate x 10 digits.  Dorsalis pedis pulses palpable b/l.  Posterior tibial pulses palpable b/l.  No digital hair x 10 digits.  Skin temperature gradient WNL b/l.  Dermatological Examination: Skin with normal turgor, texture and tone b/l.  Toenails 1-5 b/l discolored, thick, dystrophic with subungual debris and pain with palpation to nailbeds due to thickness of nails.  Hyperkeratotic lesion submetatarsal head 5 b/l. No erythema, no edema, no drainage, no flocculence noted.   Musculoskeletal: Muscle strength 5/5 to all LE muscle groups  Neurological: Sensation  intact with 10 gram monofilament.  Vibratory sensation intact.  Assessment: 1. Painful onychomycosis toenails 1-5 b/l in patient on blood thinner.  2. Callus submet head 5 b/l 3. NIDDM 4. Patient on long term blood thinner  Plan: 1. Toenails 1-5 b/l were debrided in length and girth without iatrogenic bleeding. 2. Discussed her burning/itching symptoms could be related to her dialysis treatments or early neuropathy symptoms. Advised her to discuss with her nephrologist. 3. Hyperkeratotic lesion(s) submet head 5 b/l debrided utilizing  sterile scalpel blade without incident.  4. Patient to continue soft, supportive shoe gear daily. 5. Patient to report any pedal injuries to medical professional immediately. 6. Avoid self trimming due to use of blood thinner. 7. Follow up 10 weeks. 8. Patient/POA to call should there be a concern in the interim.

## 2019-02-26 DIAGNOSIS — N2581 Secondary hyperparathyroidism of renal origin: Secondary | ICD-10-CM | POA: Diagnosis not present

## 2019-02-26 DIAGNOSIS — N186 End stage renal disease: Secondary | ICD-10-CM | POA: Diagnosis not present

## 2019-03-01 DIAGNOSIS — N186 End stage renal disease: Secondary | ICD-10-CM | POA: Diagnosis not present

## 2019-03-01 DIAGNOSIS — N2581 Secondary hyperparathyroidism of renal origin: Secondary | ICD-10-CM | POA: Diagnosis not present

## 2019-03-02 DIAGNOSIS — E1122 Type 2 diabetes mellitus with diabetic chronic kidney disease: Secondary | ICD-10-CM | POA: Diagnosis not present

## 2019-03-02 DIAGNOSIS — N186 End stage renal disease: Secondary | ICD-10-CM | POA: Diagnosis not present

## 2019-03-02 DIAGNOSIS — Z992 Dependence on renal dialysis: Secondary | ICD-10-CM | POA: Diagnosis not present

## 2019-03-03 DIAGNOSIS — N2581 Secondary hyperparathyroidism of renal origin: Secondary | ICD-10-CM | POA: Diagnosis not present

## 2019-03-03 DIAGNOSIS — N186 End stage renal disease: Secondary | ICD-10-CM | POA: Diagnosis not present

## 2019-03-05 DIAGNOSIS — N186 End stage renal disease: Secondary | ICD-10-CM | POA: Diagnosis not present

## 2019-03-05 DIAGNOSIS — N2581 Secondary hyperparathyroidism of renal origin: Secondary | ICD-10-CM | POA: Diagnosis not present

## 2019-03-08 DIAGNOSIS — N186 End stage renal disease: Secondary | ICD-10-CM | POA: Diagnosis not present

## 2019-03-08 DIAGNOSIS — N2581 Secondary hyperparathyroidism of renal origin: Secondary | ICD-10-CM | POA: Diagnosis not present

## 2019-03-10 DIAGNOSIS — N186 End stage renal disease: Secondary | ICD-10-CM | POA: Diagnosis not present

## 2019-03-10 DIAGNOSIS — N2581 Secondary hyperparathyroidism of renal origin: Secondary | ICD-10-CM | POA: Diagnosis not present

## 2019-03-12 DIAGNOSIS — N2581 Secondary hyperparathyroidism of renal origin: Secondary | ICD-10-CM | POA: Diagnosis not present

## 2019-03-12 DIAGNOSIS — N186 End stage renal disease: Secondary | ICD-10-CM | POA: Diagnosis not present

## 2019-03-15 DIAGNOSIS — N186 End stage renal disease: Secondary | ICD-10-CM | POA: Diagnosis not present

## 2019-03-15 DIAGNOSIS — N2581 Secondary hyperparathyroidism of renal origin: Secondary | ICD-10-CM | POA: Diagnosis not present

## 2019-03-17 DIAGNOSIS — N2581 Secondary hyperparathyroidism of renal origin: Secondary | ICD-10-CM | POA: Diagnosis not present

## 2019-03-17 DIAGNOSIS — N186 End stage renal disease: Secondary | ICD-10-CM | POA: Diagnosis not present

## 2019-03-19 DIAGNOSIS — N2581 Secondary hyperparathyroidism of renal origin: Secondary | ICD-10-CM | POA: Diagnosis not present

## 2019-03-19 DIAGNOSIS — N186 End stage renal disease: Secondary | ICD-10-CM | POA: Diagnosis not present

## 2019-03-22 DIAGNOSIS — N186 End stage renal disease: Secondary | ICD-10-CM | POA: Diagnosis not present

## 2019-03-22 DIAGNOSIS — L299 Pruritus, unspecified: Secondary | ICD-10-CM | POA: Diagnosis not present

## 2019-03-22 DIAGNOSIS — N2581 Secondary hyperparathyroidism of renal origin: Secondary | ICD-10-CM | POA: Diagnosis not present

## 2019-03-24 DIAGNOSIS — N186 End stage renal disease: Secondary | ICD-10-CM | POA: Diagnosis not present

## 2019-03-24 DIAGNOSIS — N2581 Secondary hyperparathyroidism of renal origin: Secondary | ICD-10-CM | POA: Diagnosis not present

## 2019-03-24 DIAGNOSIS — L299 Pruritus, unspecified: Secondary | ICD-10-CM | POA: Diagnosis not present

## 2019-03-26 DIAGNOSIS — L299 Pruritus, unspecified: Secondary | ICD-10-CM | POA: Diagnosis not present

## 2019-03-26 DIAGNOSIS — N2581 Secondary hyperparathyroidism of renal origin: Secondary | ICD-10-CM | POA: Diagnosis not present

## 2019-03-26 DIAGNOSIS — N186 End stage renal disease: Secondary | ICD-10-CM | POA: Diagnosis not present

## 2019-03-29 DIAGNOSIS — N2581 Secondary hyperparathyroidism of renal origin: Secondary | ICD-10-CM | POA: Diagnosis not present

## 2019-03-29 DIAGNOSIS — E162 Hypoglycemia, unspecified: Secondary | ICD-10-CM | POA: Diagnosis not present

## 2019-03-29 DIAGNOSIS — N186 End stage renal disease: Secondary | ICD-10-CM | POA: Diagnosis not present

## 2019-03-31 DIAGNOSIS — N186 End stage renal disease: Secondary | ICD-10-CM | POA: Diagnosis not present

## 2019-03-31 DIAGNOSIS — E162 Hypoglycemia, unspecified: Secondary | ICD-10-CM | POA: Diagnosis not present

## 2019-03-31 DIAGNOSIS — N2581 Secondary hyperparathyroidism of renal origin: Secondary | ICD-10-CM | POA: Diagnosis not present

## 2019-04-01 DIAGNOSIS — N186 End stage renal disease: Secondary | ICD-10-CM | POA: Diagnosis not present

## 2019-04-01 DIAGNOSIS — Z992 Dependence on renal dialysis: Secondary | ICD-10-CM | POA: Diagnosis not present

## 2019-04-01 DIAGNOSIS — E1122 Type 2 diabetes mellitus with diabetic chronic kidney disease: Secondary | ICD-10-CM | POA: Diagnosis not present

## 2019-04-02 DIAGNOSIS — N186 End stage renal disease: Secondary | ICD-10-CM | POA: Diagnosis not present

## 2019-04-02 DIAGNOSIS — N2581 Secondary hyperparathyroidism of renal origin: Secondary | ICD-10-CM | POA: Diagnosis not present

## 2019-04-05 DIAGNOSIS — N186 End stage renal disease: Secondary | ICD-10-CM | POA: Diagnosis not present

## 2019-04-05 DIAGNOSIS — N2581 Secondary hyperparathyroidism of renal origin: Secondary | ICD-10-CM | POA: Diagnosis not present

## 2019-04-07 DIAGNOSIS — N2581 Secondary hyperparathyroidism of renal origin: Secondary | ICD-10-CM | POA: Diagnosis not present

## 2019-04-07 DIAGNOSIS — N186 End stage renal disease: Secondary | ICD-10-CM | POA: Diagnosis not present

## 2019-04-09 DIAGNOSIS — N186 End stage renal disease: Secondary | ICD-10-CM | POA: Diagnosis not present

## 2019-04-09 DIAGNOSIS — N2581 Secondary hyperparathyroidism of renal origin: Secondary | ICD-10-CM | POA: Diagnosis not present

## 2019-04-12 DIAGNOSIS — N186 End stage renal disease: Secondary | ICD-10-CM | POA: Diagnosis not present

## 2019-04-12 DIAGNOSIS — N2581 Secondary hyperparathyroidism of renal origin: Secondary | ICD-10-CM | POA: Diagnosis not present

## 2019-04-14 DIAGNOSIS — N2581 Secondary hyperparathyroidism of renal origin: Secondary | ICD-10-CM | POA: Diagnosis not present

## 2019-04-14 DIAGNOSIS — N186 End stage renal disease: Secondary | ICD-10-CM | POA: Diagnosis not present

## 2019-04-16 DIAGNOSIS — N186 End stage renal disease: Secondary | ICD-10-CM | POA: Diagnosis not present

## 2019-04-16 DIAGNOSIS — N2581 Secondary hyperparathyroidism of renal origin: Secondary | ICD-10-CM | POA: Diagnosis not present

## 2019-04-19 DIAGNOSIS — N2581 Secondary hyperparathyroidism of renal origin: Secondary | ICD-10-CM | POA: Diagnosis not present

## 2019-04-19 DIAGNOSIS — N186 End stage renal disease: Secondary | ICD-10-CM | POA: Diagnosis not present

## 2019-04-21 DIAGNOSIS — N2581 Secondary hyperparathyroidism of renal origin: Secondary | ICD-10-CM | POA: Diagnosis not present

## 2019-04-21 DIAGNOSIS — N186 End stage renal disease: Secondary | ICD-10-CM | POA: Diagnosis not present

## 2019-04-23 DIAGNOSIS — N2581 Secondary hyperparathyroidism of renal origin: Secondary | ICD-10-CM | POA: Diagnosis not present

## 2019-04-23 DIAGNOSIS — N186 End stage renal disease: Secondary | ICD-10-CM | POA: Diagnosis not present

## 2019-04-26 DIAGNOSIS — N2581 Secondary hyperparathyroidism of renal origin: Secondary | ICD-10-CM | POA: Diagnosis not present

## 2019-04-26 DIAGNOSIS — N186 End stage renal disease: Secondary | ICD-10-CM | POA: Diagnosis not present

## 2019-04-28 DIAGNOSIS — N2581 Secondary hyperparathyroidism of renal origin: Secondary | ICD-10-CM | POA: Diagnosis not present

## 2019-04-28 DIAGNOSIS — N186 End stage renal disease: Secondary | ICD-10-CM | POA: Diagnosis not present

## 2019-04-29 ENCOUNTER — Ambulatory Visit (INDEPENDENT_AMBULATORY_CARE_PROVIDER_SITE_OTHER): Payer: BC Managed Care – PPO | Admitting: Podiatry

## 2019-04-29 ENCOUNTER — Other Ambulatory Visit: Payer: Self-pay

## 2019-04-29 VITALS — Temp 97.2°F

## 2019-04-29 DIAGNOSIS — Z9229 Personal history of other drug therapy: Secondary | ICD-10-CM | POA: Diagnosis not present

## 2019-04-29 DIAGNOSIS — L84 Corns and callosities: Secondary | ICD-10-CM

## 2019-04-29 DIAGNOSIS — M79675 Pain in left toe(s): Secondary | ICD-10-CM

## 2019-04-29 DIAGNOSIS — E119 Type 2 diabetes mellitus without complications: Secondary | ICD-10-CM

## 2019-04-29 DIAGNOSIS — B351 Tinea unguium: Secondary | ICD-10-CM | POA: Diagnosis not present

## 2019-04-29 DIAGNOSIS — Z7901 Long term (current) use of anticoagulants: Secondary | ICD-10-CM

## 2019-04-29 DIAGNOSIS — M79674 Pain in right toe(s): Secondary | ICD-10-CM

## 2019-04-29 NOTE — Patient Instructions (Signed)
Diabetes Mellitus and Foot Care Foot care is an important part of your health, especially when you have diabetes. Diabetes may cause you to have problems because of poor blood flow (circulation) to your feet and legs, which can cause your skin to:  Become thinner and drier.  Break more easily.  Heal more slowly.  Peel and crack. You may also have nerve damage (neuropathy) in your legs and feet, causing decreased feeling in them. This means that you may not notice minor injuries to your feet that could lead to more serious problems. Noticing and addressing any potential problems early is the best way to prevent future foot problems. How to care for your feet Foot hygiene  Wash your feet daily with warm water and mild soap. Do not use hot water. Then, pat your feet and the areas between your toes until they are completely dry. Do not soak your feet as this can dry your skin.  Trim your toenails straight across. Do not dig under them or around the cuticle. File the edges of your nails with an emery board or nail file.  Apply a moisturizing lotion or petroleum jelly to the skin on your feet and to dry, brittle toenails. Use lotion that does not contain alcohol and is unscented. Do not apply lotion between your toes. Shoes and socks  Wear clean socks or stockings every day. Make sure they are not too tight. Do not wear knee-high stockings since they may decrease blood flow to your legs.  Wear shoes that fit properly and have enough cushioning. Always look in your shoes before you put them on to be sure there are no objects inside.  To break in new shoes, wear them for just a few hours a day. This prevents injuries on your feet. Wounds, scrapes, corns, and calluses  Check your feet daily for blisters, cuts, bruises, sores, and redness. If you cannot see the bottom of your feet, use a mirror or ask someone for help.  Do not cut corns or calluses or try to remove them with medicine.  If you  find a minor scrape, cut, or break in the skin on your feet, keep it and the skin around it clean and dry. You may clean these areas with mild soap and water. Do not clean the area with peroxide, alcohol, or iodine.  If you have a wound, scrape, corn, or callus on your foot, look at it several times a day to make sure it is healing and not infected. Check for: ? Redness, swelling, or pain. ? Fluid or blood. ? Warmth. ? Pus or a bad smell. General instructions  Do not cross your legs. This may decrease blood flow to your feet.  Do not use heating pads or hot water bottles on your feet. They may burn your skin. If you have lost feeling in your feet or legs, you may not know this is happening until it is too late.  Protect your feet from hot and cold by wearing shoes, such as at the beach or on hot pavement.  Schedule a complete foot exam at least once a year (annually) or more often if you have foot problems. If you have foot problems, report any cuts, sores, or bruises to your health care provider immediately. Contact a health care provider if:  You have a medical condition that increases your risk of infection and you have any cuts, sores, or bruises on your feet.  You have an injury that is not   healing.  You have redness on your legs or feet.  You feel burning or tingling in your legs or feet.  You have pain or cramps in your legs and feet.  Your legs or feet are numb.  Your feet always feel cold.  You have pain around a toenail. Get help right away if:  You have a wound, scrape, corn, or callus on your foot and: ? You have pain, swelling, or redness that gets worse. ? You have fluid or blood coming from the wound, scrape, corn, or callus. ? Your wound, scrape, corn, or callus feels warm to the touch. ? You have pus or a bad smell coming from the wound, scrape, corn, or callus. ? You have a fever. ? You have a red line going up your leg. Summary  Check your feet every day  for cuts, sores, red spots, swelling, and blisters.  Moisturize feet and legs daily.  Wear shoes that fit properly and have enough cushioning.  If you have foot problems, report any cuts, sores, or bruises to your health care provider immediately.  Schedule a complete foot exam at least once a year (annually) or more often if you have foot problems. This information is not intended to replace advice given to you by your health care provider. Make sure you discuss any questions you have with your health care provider. Document Released: 11/15/2000 Document Revised: 12/31/2017 Document Reviewed: 12/20/2016 Elsevier Interactive Patient Education  2019 [place] are small areas of thickened skin that occur on the top, sides, or tip of a toe. They contain a cone-shaped core with a point that can press on a nerve below. This causes pain.  Calluses are areas of thickened skin that can occur anywhere on the body, including the hands, fingers, palms, soles of the feet, and heels. Calluses are usually larger than corns. What are the causes? Corns and calluses are caused by rubbing (friction) or pressure, such as from shoes that are too tight or do not fit properly. What increases the risk? Corns are more likely to develop in people who have misshapen toes (toe deformities), such as hammer toes. Calluses can occur with friction to any area of the skin. They are more likely to develop in people who:  Work with their hands.  Wear shoes that fit poorly, are too tight, or are high-heeled.  Have toe deformities. What are the signs or symptoms? Symptoms of a corn or callus include:  A hard growth on the skin.  Pain or tenderness under the skin.  Redness and swelling.  Increased discomfort while wearing tight-fitting shoes, if your feet are affected. If a corn or callus becomes infected, symptoms may include:  Redness and swelling that gets worse.  Pain.   Fluid, blood, or pus draining from the corn or callus. How is this diagnosed? Corns and calluses may be diagnosed based on your symptoms, your medical history, and a physical exam. How is this treated? Treatment for corns and calluses may include:  Removing the cause of the friction or pressure. This may involve: ? Changing your shoes. ? Wearing shoe inserts (orthotics) or other protective layers in your shoes, such as a corn pad. ? Wearing gloves.  Applying medicine to the skin (topical medicine) to help soften skin in the hardened, thickened areas.  Removing layers of dead skin with a file to reduce the size of the corn or callus.  Removing the corn or callus with a scalpel  or laser.  Taking antibiotic medicines, if your corn or callus is infected.  Having surgery, if a toe deformity is the cause. Follow these instructions at home:   Take over-the-counter and prescription medicines only as told by your health care provider.  If you were prescribed an antibiotic, take it as told by your health care provider. Do not stop taking it even if your condition starts to improve.  Wear shoes that fit well. Avoid wearing high-heeled shoes and shoes that are too tight or too loose.  Wear any padding, protective layers, gloves, or orthotics as told by your health care provider.  Soak your hands or feet and then use a file or pumice stone to soften your corn or callus. Do this as told by your health care provider.  Check your corn or callus every day for symptoms of infection. Contact a health care provider if you:  Notice that your symptoms do not improve with treatment.  Have redness or swelling that gets worse.  Notice that your corn or callus becomes painful.  Have fluid, blood, or pus coming from your corn or callus.  Have new symptoms. Summary  Corns are small areas of thickened skin that occur on the top, sides, or tip of a toe.  Calluses are areas of thickened skin that  can occur anywhere on the body, including the hands, fingers, palms, and soles of the feet. Calluses are usually larger than corns.  Corns and calluses are caused by rubbing (friction) or pressure, such as from shoes that are too tight or do not fit properly.  Treatment may include wearing any padding, protective layers, gloves, or orthotics as told by your health care provider. This information is not intended to replace advice given to you by your health care provider. Make sure you discuss any questions you have with your health care provider. Document Released: 08/24/2004 Document Revised: 10/01/2017 Document Reviewed: 10/01/2017 Elsevier Interactive Patient Education  2019 [employer]  Onychomycosis/Fungal Toenails  WHAT IS IT? An infection that lies within the keratin of your nail plate that is caused by a fungus.  WHY ME? Fungal infections affect all ages, sexes, races, and creeds.  There may be many factors that predispose you to a fungal infection such as age, coexisting medical conditions such as diabetes, or an autoimmune disease; stress, medications, fatigue, genetics, etc.  Bottom line: fungus thrives in a warm, moist environment and your shoes offer such a location.  IS IT CONTAGIOUS? Theoretically, yes.  You do not want to share shoes, nail clippers or files with someone who has fungal toenails.  Walking around barefoot in the same room or sleeping in the same bed is unlikely to transfer the organism.  It is important to realize, however, that fungus can spread easily from one nail to the next on the same foot.  HOW DO WE TREAT THIS?  There are several ways to treat this condition.  Treatment may depend on many factors such as age, medications, pregnancy, liver and kidney conditions, etc.  It is best to ask your doctor which options are available to you.  1. No treatment.   Unlike many other medical concerns, you can live with this condition.  However for many people this can be  a painful condition and may lead to ingrown toenails or a bacterial infection.  It is recommended that you keep the nails cut short to help reduce the amount of fungal nail. 2. Topical treatment.  These range from herbal remedies  to prescription strength nail lacquers.  About 40-50% effective, topicals require twice daily application for approximately 9 to 12 months or until an entirely new nail has grown out.  The most effective topicals are medical grade medications available through physicians offices. 3. Oral antifungal medications.  With an 80-90% cure rate, the most common oral medication requires 3 to 4 months of therapy and stays in your system for a year as the new nail grows out.  Oral antifungal medications do require blood work to make sure it is a safe drug for you.  A liver function panel will be performed prior to starting the medication and after the first month of treatment.  It is important to have the blood work performed to avoid any harmful side effects.  In general, this medication safe but blood work is required. 4. Laser Therapy.  This treatment is performed by applying a specialized laser to the affected nail plate.  This therapy is noninvasive, fast, and non-painful.  It is not covered by insurance and is therefore, out of pocket.  The results have been very good with a 80-95% cure rate.  [facility] is the only practice in the area to offer this therapy. 5. Permanent Nail Avulsion.  Removing the entire nail so that a new nail will not grow back.

## 2019-04-30 DIAGNOSIS — N186 End stage renal disease: Secondary | ICD-10-CM | POA: Diagnosis not present

## 2019-04-30 DIAGNOSIS — N2581 Secondary hyperparathyroidism of renal origin: Secondary | ICD-10-CM | POA: Diagnosis not present

## 2019-05-02 ENCOUNTER — Encounter: Payer: Self-pay | Admitting: Podiatry

## 2019-05-02 DIAGNOSIS — E1122 Type 2 diabetes mellitus with diabetic chronic kidney disease: Secondary | ICD-10-CM | POA: Diagnosis not present

## 2019-05-02 DIAGNOSIS — N186 End stage renal disease: Secondary | ICD-10-CM | POA: Diagnosis not present

## 2019-05-02 DIAGNOSIS — Z992 Dependence on renal dialysis: Secondary | ICD-10-CM | POA: Diagnosis not present

## 2019-05-02 NOTE — Progress Notes (Signed)
Subjective: Brenda Arnold presents today for preventative diabetic foot care.  Patient seen for follow up of chronic, painful mycotic toenails.Pain is aggravated when wearing enclosed shoe gear. Pain is getting progressively worse and relieved with periodic professional debridement.   Kelton Pillar, MD is his PCP.  She is still on blood thinner, Eliquis.    Current Outpatient Medications:  .  aspirin EC 81 MG tablet, Take 1 tablet (81 mg total) by mouth 2 (two) times daily., Disp: 84 tablet, Rfl: 0 .  atorvastatin (LIPITOR) 40 MG tablet, Take 1 tablet (40 mg total) by mouth daily at 6 PM., Disp: 30 tablet, Rfl: 1 .  B Complex-C-Folic Acid (DIALYVITE TABLET) TABS, Take 1 tablet by mouth at bedtime. , Disp: , Rfl: 3 .  calcitRIOL (ROCALTROL) 0.5 MCG capsule, Take 3 capsules (1.5 mcg total) by mouth 3 (three) times a week., Disp: 90 capsule, Rfl: 0 .  ELIQUIS 5 MG TABS tablet, , Disp: , Rfl:  .  ethyl chloride spray, Apply 1 application topically See admin instructions. For dialysis, Disp: , Rfl: 6 .  gabapentin (NEURONTIN) 100 MG capsule, Take 100 mg by mouth at bedtime., Disp: , Rfl: 3 .  methocarbamol (ROBAXIN) 500 MG tablet, Take 1 tablet (500 mg total) by mouth every 6 (six) hours as needed for muscle spasms., Disp: 30 tablet, Rfl: 2 .  midodrine (PROAMATINE) 5 MG tablet, , Disp: , Rfl:  .  ondansetron (ZOFRAN) 4 MG tablet, Take 1-2 tablets (4-8 mg total) by mouth every 8 (eight) hours as needed for nausea or vomiting., Disp: 40 tablet, Rfl: 0 .  oxyCODONE (OXY IR/ROXICODONE) 5 MG immediate release tablet, Take 1-3 tablets (5-15 mg total) by mouth every 4 (four) hours as needed., Disp: 30 tablet, Rfl: 0 .  predniSONE (DELTASONE) 20 MG tablet, TAKE 1 TABLET 2 TIMES PER DAY FOR 3 DAYS, 1 TIME PER DAY FOR 3 DAYS ORALLY, Disp: , Rfl:  .  promethazine (PHENERGAN) 25 MG tablet, Take 1 tablet (25 mg total) by mouth every 6 (six) hours as needed for nausea., Disp: 30 tablet, Rfl: 1 .   senna-docusate (SENOKOT S) 8.6-50 MG tablet, Take 1 tablet by mouth at bedtime as needed., Disp: 30 tablet, Rfl: 1 .  sucroferric oxyhydroxide (VELPHORO) 500 MG chewable tablet, Chew 2 capsules by mouth See admin instructions. 2 each three times daily with meals, and 2 for 1 daily snack, Disp: , Rfl:  .  warfarin (COUMADIN) 7.5 MG tablet, Take 1 tablet (7.5 mg total) by mouth daily. (Patient not taking: Reported on 02/18/2019), Disp: 30 tablet, Rfl: 11  Allergies  Allergen Reactions  . Iodine Shortness Of Breath  . Shellfish Allergy Shortness Of Breath  . Norvasc [Amlodipine] Swelling    SWELLING REACTION UNSPECIFIED     Objective: Vitals:   04/29/19 1452  Temp: (!) 97.2 F (36.2 C)    Vascular Examination: Capillary refill time immediate x 10 digits.  Dorsalis pedis pulses palpable b/l.  Posterior tibial pulses palpable b/l.  No digital hair x 10 digits.  Skin temperature gradient WNL b/l.  Dermatological Examination: Skin with normal turgor, texture and tone b/l.  Toenails 1-5 b/l discolored, thick, dystrophic with subungual debris and pain with palpation to nailbeds due to thickness of nails.  Hyperkeratotic lesion(s) submet head 5 b/l. No erythema, no edema, no drainage, no flocculence noted.   Musculoskeletal: Muscle strength 5/5 to all LE muscle groups  Neurological: Sensation intact with 10 gram monofilament.  Vibratory sensation intact.  Assessment: 1. Painful onychomycosis toenails 1-5 b/l 2. Calluses submet head 5 b/l 3. NIDDM 4.  Encounter for diabetic foot examination  Plan: 1. Toenails 1-5 b/l were debrided in length and girth without iatrogenic bleeding. 2. Calluses pared submetatarsal head(s) 5 b/l utilizing sterile scalpel blade without incident.  3. Patient to continue soft, supportive shoe gear daily. 4. Patient to report any pedal injuries to medical professional immediately. 5. Follow up 9 weeks.  6. Patient/POA to call should there be a  concern in the interim.

## 2019-05-03 DIAGNOSIS — N2581 Secondary hyperparathyroidism of renal origin: Secondary | ICD-10-CM | POA: Diagnosis not present

## 2019-05-03 DIAGNOSIS — N186 End stage renal disease: Secondary | ICD-10-CM | POA: Diagnosis not present

## 2019-05-05 ENCOUNTER — Telehealth: Payer: Self-pay | Admitting: Podiatry

## 2019-05-05 DIAGNOSIS — N2581 Secondary hyperparathyroidism of renal origin: Secondary | ICD-10-CM | POA: Diagnosis not present

## 2019-05-05 DIAGNOSIS — N186 End stage renal disease: Secondary | ICD-10-CM | POA: Diagnosis not present

## 2019-05-05 NOTE — Telephone Encounter (Signed)
Advised pt that diabetic shoes/insert are an exclusion from pts plan.

## 2019-05-06 NOTE — Telephone Encounter (Signed)
What about orthotics?

## 2019-05-07 DIAGNOSIS — N2581 Secondary hyperparathyroidism of renal origin: Secondary | ICD-10-CM | POA: Diagnosis not present

## 2019-05-07 DIAGNOSIS — N186 End stage renal disease: Secondary | ICD-10-CM | POA: Diagnosis not present

## 2019-05-10 DIAGNOSIS — N186 End stage renal disease: Secondary | ICD-10-CM | POA: Diagnosis not present

## 2019-05-10 DIAGNOSIS — N2581 Secondary hyperparathyroidism of renal origin: Secondary | ICD-10-CM | POA: Diagnosis not present

## 2019-05-12 DIAGNOSIS — N2581 Secondary hyperparathyroidism of renal origin: Secondary | ICD-10-CM | POA: Diagnosis not present

## 2019-05-12 DIAGNOSIS — N186 End stage renal disease: Secondary | ICD-10-CM | POA: Diagnosis not present

## 2019-05-14 DIAGNOSIS — N2581 Secondary hyperparathyroidism of renal origin: Secondary | ICD-10-CM | POA: Diagnosis not present

## 2019-05-14 DIAGNOSIS — N186 End stage renal disease: Secondary | ICD-10-CM | POA: Diagnosis not present

## 2019-05-17 DIAGNOSIS — N186 End stage renal disease: Secondary | ICD-10-CM | POA: Diagnosis not present

## 2019-05-17 DIAGNOSIS — N2581 Secondary hyperparathyroidism of renal origin: Secondary | ICD-10-CM | POA: Diagnosis not present

## 2019-05-19 DIAGNOSIS — N2581 Secondary hyperparathyroidism of renal origin: Secondary | ICD-10-CM | POA: Diagnosis not present

## 2019-05-19 DIAGNOSIS — N186 End stage renal disease: Secondary | ICD-10-CM | POA: Diagnosis not present

## 2019-05-21 DIAGNOSIS — N2581 Secondary hyperparathyroidism of renal origin: Secondary | ICD-10-CM | POA: Diagnosis not present

## 2019-05-21 DIAGNOSIS — N186 End stage renal disease: Secondary | ICD-10-CM | POA: Diagnosis not present

## 2019-05-24 ENCOUNTER — Other Ambulatory Visit: Payer: Self-pay

## 2019-05-24 ENCOUNTER — Ambulatory Visit (INDEPENDENT_AMBULATORY_CARE_PROVIDER_SITE_OTHER): Payer: BC Managed Care – PPO | Admitting: Orthotics

## 2019-05-24 DIAGNOSIS — N186 End stage renal disease: Secondary | ICD-10-CM | POA: Diagnosis not present

## 2019-05-24 DIAGNOSIS — N2581 Secondary hyperparathyroidism of renal origin: Secondary | ICD-10-CM | POA: Diagnosis not present

## 2019-05-24 DIAGNOSIS — L84 Corns and callosities: Secondary | ICD-10-CM | POA: Diagnosis not present

## 2019-05-24 DIAGNOSIS — E119 Type 2 diabetes mellitus without complications: Secondary | ICD-10-CM

## 2019-05-24 NOTE — Progress Notes (Signed)
Patient came into today to be cast for Custom Foot Orthotics. Upon recommendation of Dr. Elisha Ponder Patient presents with hx of callus 5th met and dm2 Goals are off loading 5 met heads Plan vendor [place]ie

## 2019-05-26 DIAGNOSIS — N186 End stage renal disease: Secondary | ICD-10-CM | POA: Diagnosis not present

## 2019-05-26 DIAGNOSIS — N2581 Secondary hyperparathyroidism of renal origin: Secondary | ICD-10-CM | POA: Diagnosis not present

## 2019-05-28 DIAGNOSIS — N186 End stage renal disease: Secondary | ICD-10-CM | POA: Diagnosis not present

## 2019-05-28 DIAGNOSIS — N2581 Secondary hyperparathyroidism of renal origin: Secondary | ICD-10-CM | POA: Diagnosis not present

## 2019-05-31 DIAGNOSIS — N186 End stage renal disease: Secondary | ICD-10-CM | POA: Diagnosis not present

## 2019-05-31 DIAGNOSIS — N2581 Secondary hyperparathyroidism of renal origin: Secondary | ICD-10-CM | POA: Diagnosis not present

## 2019-06-01 DIAGNOSIS — E1122 Type 2 diabetes mellitus with diabetic chronic kidney disease: Secondary | ICD-10-CM | POA: Diagnosis not present

## 2019-06-01 DIAGNOSIS — N186 End stage renal disease: Secondary | ICD-10-CM | POA: Diagnosis not present

## 2019-06-01 DIAGNOSIS — Z992 Dependence on renal dialysis: Secondary | ICD-10-CM | POA: Diagnosis not present

## 2019-06-02 DIAGNOSIS — N2581 Secondary hyperparathyroidism of renal origin: Secondary | ICD-10-CM | POA: Diagnosis not present

## 2019-06-02 DIAGNOSIS — N186 End stage renal disease: Secondary | ICD-10-CM | POA: Diagnosis not present

## 2019-06-04 DIAGNOSIS — N2581 Secondary hyperparathyroidism of renal origin: Secondary | ICD-10-CM | POA: Diagnosis not present

## 2019-06-04 DIAGNOSIS — N186 End stage renal disease: Secondary | ICD-10-CM | POA: Diagnosis not present

## 2019-06-07 DIAGNOSIS — N186 End stage renal disease: Secondary | ICD-10-CM | POA: Diagnosis not present

## 2019-06-07 DIAGNOSIS — N2581 Secondary hyperparathyroidism of renal origin: Secondary | ICD-10-CM | POA: Diagnosis not present

## 2019-06-09 DIAGNOSIS — N186 End stage renal disease: Secondary | ICD-10-CM | POA: Diagnosis not present

## 2019-06-09 DIAGNOSIS — N2581 Secondary hyperparathyroidism of renal origin: Secondary | ICD-10-CM | POA: Diagnosis not present

## 2019-06-11 DIAGNOSIS — N2581 Secondary hyperparathyroidism of renal origin: Secondary | ICD-10-CM | POA: Diagnosis not present

## 2019-06-11 DIAGNOSIS — N186 End stage renal disease: Secondary | ICD-10-CM | POA: Diagnosis not present

## 2019-06-14 ENCOUNTER — Other Ambulatory Visit: Payer: Self-pay

## 2019-06-14 ENCOUNTER — Ambulatory Visit: Payer: BC Managed Care – PPO | Admitting: Orthotics

## 2019-06-14 DIAGNOSIS — E119 Type 2 diabetes mellitus without complications: Secondary | ICD-10-CM

## 2019-06-14 DIAGNOSIS — L84 Corns and callosities: Secondary | ICD-10-CM

## 2019-06-14 DIAGNOSIS — N2581 Secondary hyperparathyroidism of renal origin: Secondary | ICD-10-CM | POA: Diagnosis not present

## 2019-06-14 DIAGNOSIS — N186 End stage renal disease: Secondary | ICD-10-CM | POA: Diagnosis not present

## 2019-06-14 NOTE — Progress Notes (Signed)
Patient came in today to pick up custom made foot orthotics.  The goals were accomplished and the patient reported no dissatisfaction with said orthotics.  Patient was advised of breakin period and how to report any issues. 

## 2019-06-16 DIAGNOSIS — N186 End stage renal disease: Secondary | ICD-10-CM | POA: Diagnosis not present

## 2019-06-16 DIAGNOSIS — N2581 Secondary hyperparathyroidism of renal origin: Secondary | ICD-10-CM | POA: Diagnosis not present

## 2019-06-18 DIAGNOSIS — N186 End stage renal disease: Secondary | ICD-10-CM | POA: Diagnosis not present

## 2019-06-18 DIAGNOSIS — N2581 Secondary hyperparathyroidism of renal origin: Secondary | ICD-10-CM | POA: Diagnosis not present

## 2019-06-21 DIAGNOSIS — N2581 Secondary hyperparathyroidism of renal origin: Secondary | ICD-10-CM | POA: Diagnosis not present

## 2019-06-21 DIAGNOSIS — N186 End stage renal disease: Secondary | ICD-10-CM | POA: Diagnosis not present

## 2019-06-23 DIAGNOSIS — N186 End stage renal disease: Secondary | ICD-10-CM | POA: Diagnosis not present

## 2019-06-23 DIAGNOSIS — N2581 Secondary hyperparathyroidism of renal origin: Secondary | ICD-10-CM | POA: Diagnosis not present

## 2019-06-25 DIAGNOSIS — N186 End stage renal disease: Secondary | ICD-10-CM | POA: Diagnosis not present

## 2019-06-25 DIAGNOSIS — N2581 Secondary hyperparathyroidism of renal origin: Secondary | ICD-10-CM | POA: Diagnosis not present

## 2019-06-28 DIAGNOSIS — N186 End stage renal disease: Secondary | ICD-10-CM | POA: Diagnosis not present

## 2019-06-28 DIAGNOSIS — N2581 Secondary hyperparathyroidism of renal origin: Secondary | ICD-10-CM | POA: Diagnosis not present

## 2019-06-30 DIAGNOSIS — N186 End stage renal disease: Secondary | ICD-10-CM | POA: Diagnosis not present

## 2019-06-30 DIAGNOSIS — N2581 Secondary hyperparathyroidism of renal origin: Secondary | ICD-10-CM | POA: Diagnosis not present

## 2019-07-02 DIAGNOSIS — E1122 Type 2 diabetes mellitus with diabetic chronic kidney disease: Secondary | ICD-10-CM | POA: Diagnosis not present

## 2019-07-02 DIAGNOSIS — N2581 Secondary hyperparathyroidism of renal origin: Secondary | ICD-10-CM | POA: Diagnosis not present

## 2019-07-02 DIAGNOSIS — Z992 Dependence on renal dialysis: Secondary | ICD-10-CM | POA: Diagnosis not present

## 2019-07-02 DIAGNOSIS — N186 End stage renal disease: Secondary | ICD-10-CM | POA: Diagnosis not present

## 2019-07-05 DIAGNOSIS — N2581 Secondary hyperparathyroidism of renal origin: Secondary | ICD-10-CM | POA: Diagnosis not present

## 2019-07-05 DIAGNOSIS — Z992 Dependence on renal dialysis: Secondary | ICD-10-CM | POA: Diagnosis not present

## 2019-07-05 DIAGNOSIS — N186 End stage renal disease: Secondary | ICD-10-CM | POA: Diagnosis not present

## 2019-07-06 ENCOUNTER — Encounter: Payer: Self-pay | Admitting: Podiatry

## 2019-07-06 ENCOUNTER — Ambulatory Visit (INDEPENDENT_AMBULATORY_CARE_PROVIDER_SITE_OTHER): Payer: BC Managed Care – PPO | Admitting: Podiatry

## 2019-07-06 ENCOUNTER — Other Ambulatory Visit: Payer: Self-pay

## 2019-07-06 VITALS — Temp 98.3°F

## 2019-07-06 DIAGNOSIS — Z992 Dependence on renal dialysis: Secondary | ICD-10-CM

## 2019-07-06 DIAGNOSIS — M79674 Pain in right toe(s): Secondary | ICD-10-CM | POA: Diagnosis not present

## 2019-07-06 DIAGNOSIS — E0822 Diabetes mellitus due to underlying condition with diabetic chronic kidney disease: Secondary | ICD-10-CM

## 2019-07-06 DIAGNOSIS — N186 End stage renal disease: Secondary | ICD-10-CM

## 2019-07-06 DIAGNOSIS — B351 Tinea unguium: Secondary | ICD-10-CM

## 2019-07-06 DIAGNOSIS — M79675 Pain in left toe(s): Secondary | ICD-10-CM

## 2019-07-06 DIAGNOSIS — L84 Corns and callosities: Secondary | ICD-10-CM

## 2019-07-06 NOTE — Patient Instructions (Addendum)
Diabetes Mellitus and Foot Care Foot care is an important part of your health, especially when you have diabetes. Diabetes may cause you to have problems because of poor blood flow (circulation) to your feet and legs, which can cause your skin to:  Become thinner and drier.  Break more easily.  Heal more slowly.  Peel and crack. You may also have nerve damage (neuropathy) in your legs and feet, causing decreased feeling in them. This means that you may not notice minor injuries to your feet that could lead to more serious problems. Noticing and addressing any potential problems early is the best way to prevent future foot problems. How to care for your feet Foot hygiene  Wash your feet daily with warm water and mild soap. Do not use hot water. Then, pat your feet and the areas between your toes until they are completely dry. Do not soak your feet as this can dry your skin.  Trim your toenails straight across. Do not dig under them or around the cuticle. File the edges of your nails with an emery board or nail file.  Apply a moisturizing lotion or petroleum jelly to the skin on your feet and to dry, brittle toenails. Use lotion that does not contain alcohol and is unscented. Do not apply lotion between your toes. Shoes and socks  Wear clean socks or stockings every day. Make sure they are not too tight. Do not wear knee-high stockings since they may decrease blood flow to your legs.  Wear shoes that fit properly and have enough cushioning. Always look in your shoes before you put them on to be sure there are no objects inside.  To break in new shoes, wear them for just a few hours a day. This prevents injuries on your feet. Wounds, scrapes, corns, and calluses  Check your feet daily for blisters, cuts, bruises, sores, and redness. If you cannot see the bottom of your feet, use a mirror or ask someone for help.  Do not cut corns or calluses or try to remove them with medicine.  If you  find a minor scrape, cut, or break in the skin on your feet, keep it and the skin around it clean and dry. You may clean these areas with mild soap and water. Do not clean the area with peroxide, alcohol, or iodine.  If you have a wound, scrape, corn, or callus on your foot, look at it several times a day to make sure it is healing and not infected. Check for: ? Redness, swelling, or pain. ? Fluid or blood. ? Warmth. ? Pus or a bad smell. General instructions  Do not cross your legs. This may decrease blood flow to your feet.  Do not use heating pads or hot water bottles on your feet. They may burn your skin. If you have lost feeling in your feet or legs, you may not know this is happening until it is too late.  Protect your feet from hot and cold by wearing shoes, such as at the beach or on hot pavement.  Schedule a complete foot exam at least once a year (annually) or more often if you have foot problems. If you have foot problems, report any cuts, sores, or bruises to your health care provider immediately. Contact a health care provider if:  You have a medical condition that increases your risk of infection and you have any cuts, sores, or bruises on your feet.  You have an injury that is not   healing.  You have redness on your legs or feet.  You feel burning or tingling in your legs or feet.  You have pain or cramps in your legs and feet.  Your legs or feet are numb.  Your feet always feel cold.  You have pain around a toenail. Get help right away if:  You have a wound, scrape, corn, or callus on your foot and: ? You have pain, swelling, or redness that gets worse. ? You have fluid or blood coming from the wound, scrape, corn, or callus. ? Your wound, scrape, corn, or callus feels warm to the touch. ? You have pus or a bad smell coming from the wound, scrape, corn, or callus. ? You have a fever. ? You have a red line going up your leg. Summary  Check your feet every day  for cuts, sores, red spots, swelling, and blisters.  Moisturize feet and legs daily.  Wear shoes that fit properly and have enough cushioning.  If you have foot problems, report any cuts, sores, or bruises to your health care provider immediately.  Schedule a complete foot exam at least once a year (annually) or more often if you have foot problems. This information is not intended to replace advice given to you by your health care provider. Make sure you discuss any questions you have with your health care provider. Document Released: 11/15/2000 Document Revised: 12/31/2017 Document Reviewed: 12/20/2016 Elsevier Patient Education  [place] are small areas of thickened skin that occur on the top, sides, or tip of a toe. They contain a cone-shaped core with a point that can press on a nerve below. This causes pain.  Calluses are areas of thickened skin that can occur anywhere on the body, including the hands, fingers, palms, soles of the feet, and heels. Calluses are usually larger than corns. What are the causes? Corns and calluses are caused by rubbing (friction) or pressure, such as from shoes that are too tight or do not fit properly. What increases the risk? Corns are more likely to develop in people who have misshapen toes (toe deformities), such as hammer toes. Calluses can occur with friction to any area of the skin. They are more likely to develop in people who:  Work with their hands.  Wear shoes that fit poorly, are too tight, or are high-heeled.  Have toe deformities. What are the signs or symptoms? Symptoms of a corn or callus include:  A hard growth on the skin.  Pain or tenderness under the skin.  Redness and swelling.  Increased discomfort while wearing tight-fitting shoes, if your feet are affected. If a corn or callus becomes infected, symptoms may include:  Redness and swelling that gets worse.  Pain.  Fluid, blood, or  pus draining from the corn or callus. How is this diagnosed? Corns and calluses may be diagnosed based on your symptoms, your medical history, and a physical exam. How is this treated? Treatment for corns and calluses may include:  Removing the cause of the friction or pressure. This may involve: ? Changing your shoes. ? Wearing shoe inserts (orthotics) or other protective layers in your shoes, such as a corn pad. ? Wearing gloves.  Applying medicine to the skin (topical medicine) to help soften skin in the hardened, thickened areas.  Removing layers of dead skin with a file to reduce the size of the corn or callus.  Removing the corn or callus with a scalpel or  laser.  Taking antibiotic medicines, if your corn or callus is infected.  Having surgery, if a toe deformity is the cause. Follow these instructions at home:   Take over-the-counter and prescription medicines only as told by your health care provider.  If you were prescribed an antibiotic, take it as told by your health care provider. Do not stop taking it even if your condition starts to improve.  Wear shoes that fit well. Avoid wearing high-heeled shoes and shoes that are too tight or too loose.  Wear any padding, protective layers, gloves, or orthotics as told by your health care provider.  Soak your hands or feet and then use a file or pumice stone to soften your corn or callus. Do this as told by your health care provider.  Check your corn or callus every day for symptoms of infection. Contact a health care provider if you:  Notice that your symptoms do not improve with treatment.  Have redness or swelling that gets worse.  Notice that your corn or callus becomes painful.  Have fluid, blood, or pus coming from your corn or callus.  Have new symptoms. Summary  Corns are small areas of thickened skin that occur on the top, sides, or tip of a toe.  Calluses are areas of thickened skin that can occur anywhere  on the body, including the hands, fingers, palms, and soles of the feet. Calluses are usually larger than corns.  Corns and calluses are caused by rubbing (friction) or pressure, such as from shoes that are too tight or do not fit properly.  Treatment may include wearing any padding, protective layers, gloves, or orthotics as told by your health care provider. This information is not intended to replace advice given to you by your health care provider. Make sure you discuss any questions you have with your health care provider. Document Released: 08/24/2004 Document Revised: 03/10/2019 Document Reviewed: 10/01/2017 Elsevier Patient Education  2020 [employer].

## 2019-07-07 DIAGNOSIS — N186 End stage renal disease: Secondary | ICD-10-CM | POA: Diagnosis not present

## 2019-07-07 DIAGNOSIS — Z992 Dependence on renal dialysis: Secondary | ICD-10-CM | POA: Diagnosis not present

## 2019-07-07 DIAGNOSIS — N2581 Secondary hyperparathyroidism of renal origin: Secondary | ICD-10-CM | POA: Diagnosis not present

## 2019-07-07 NOTE — Progress Notes (Signed)
Subjective: Brenda Arnold is a 63 y.o. y.o. female who presents today for preventative diabetic foot care with cc of painful, discolored, thick toenails and painful callus/corn which interfere with daily activities. Pain is aggravated when wearing enclosed shoe gear and relieved with periodic professional debridement.  She goes to dialysis on MWF.  She voices no new pedal problems on today's visit.   Current Outpatient Medications:  .  aspirin EC 81 MG tablet, Take 1 tablet (81 mg total) by mouth 2 (two) times daily., Disp: 84 tablet, Rfl: 0 .  atorvastatin (LIPITOR) 40 MG tablet, Take 1 tablet (40 mg total) by mouth daily at 6 PM., Disp: 30 tablet, Rfl: 1 .  B Complex-C-Folic Acid (DIALYVITE TABLET) TABS, Take 1 tablet by mouth at bedtime. , Disp: , Rfl: 3 .  calcitRIOL (ROCALTROL) 0.5 MCG capsule, Take 3 capsules (1.5 mcg total) by mouth 3 (three) times a week., Disp: 90 capsule, Rfl: 0 .  ELIQUIS 5 MG TABS tablet, , Disp: , Rfl:  .  ethyl chloride spray, Apply 1 application topically See admin instructions. For dialysis, Disp: , Rfl: 6 .  gabapentin (NEURONTIN) 100 MG capsule, Take 100 mg by mouth at bedtime., Disp: , Rfl: 3 .  methocarbamol (ROBAXIN) 500 MG tablet, Take 1 tablet (500 mg total) by mouth every 6 (six) hours as needed for muscle spasms., Disp: 30 tablet, Rfl: 2 .  midodrine (PROAMATINE) 5 MG tablet, , Disp: , Rfl:  .  ondansetron (ZOFRAN) 4 MG tablet, Take 1-2 tablets (4-8 mg total) by mouth every 8 (eight) hours as needed for nausea or vomiting., Disp: 40 tablet, Rfl: 0 .  oxyCODONE (OXY IR/ROXICODONE) 5 MG immediate release tablet, Take 1-3 tablets (5-15 mg total) by mouth every 4 (four) hours as needed., Disp: 30 tablet, Rfl: 0 .  predniSONE (DELTASONE) 20 MG tablet, TAKE 1 TABLET 2 TIMES PER DAY FOR 3 DAYS, 1 TIME PER DAY FOR 3 DAYS ORALLY, Disp: , Rfl:  .  promethazine (PHENERGAN) 25 MG tablet, Take 1 tablet (25 mg total) by mouth every 6 (six) hours as needed for  nausea., Disp: 30 tablet, Rfl: 1 .  senna-docusate (SENOKOT S) 8.6-50 MG tablet, Take 1 tablet by mouth at bedtime as needed., Disp: 30 tablet, Rfl: 1 .  sucroferric oxyhydroxide (VELPHORO) 500 MG chewable tablet, Chew 2 capsules by mouth See admin instructions. 2 each three times daily with meals, and 2 for 1 daily snack, Disp: , Rfl:  .  warfarin (COUMADIN) 7.5 MG tablet, Take 1 tablet (7.5 mg total) by mouth daily., Disp: 30 tablet, Rfl: 11  Allergies  Allergen Reactions  . Iodine Shortness Of Breath  . Shellfish Allergy Shortness Of Breath  . Norvasc [Amlodipine] Swelling    SWELLING REACTION UNSPECIFIED     Objective: Vitals:   07/06/19 1505  Temp: 98.3 F (36.8 C)    Vascular Examination: Capillary refill time immediate x 10 digits.  Dorsalis pedis pulses palpable b/l.  Posterior tibial pulses palpable b/l.  Digital hair absent x 10 digits.  Skin temperature gradient WNL b/l.  Dermatological Examination: Skin with normal turgor, texture and tone b/l.  Toenails 1-5 b/l discolored, thick, dystrophic with subungual debris and pain with palpation to nailbeds due to thickness of nails.  Musculoskeletal: Muscle strength 5/5 to all LE muscle groups  Neurological: Sensation intact 5/5 b/l with 10 gram monofilament.  Vibratory sensation intact b/l.  Assessment: 1. Painful onychomycosis toenails 1-5 b/l 2.  Calluses submet head 5 b/l  3.   NIDDM with ESRD on hemodialysis  Plan: 1. Continue diabetic foot care principles. Literature dispensed on today. 2. Toenails 1-5 b/l were debrided in length and girth without iatrogenic bleeding. 3. Calluses submet head 5 b/l  pared with sterile scalpel blade without incident. 4. Patient to continue soft, supportive shoe gear daily. 5. Patient to report any pedal injuries to medical professional immediately. 6. Follow up 9 weeks. 7. Patient/POA to call should there be a concern in the interim.

## 2019-07-09 DIAGNOSIS — N186 End stage renal disease: Secondary | ICD-10-CM | POA: Diagnosis not present

## 2019-07-09 DIAGNOSIS — Z992 Dependence on renal dialysis: Secondary | ICD-10-CM | POA: Diagnosis not present

## 2019-07-09 DIAGNOSIS — N2581 Secondary hyperparathyroidism of renal origin: Secondary | ICD-10-CM | POA: Diagnosis not present

## 2019-07-12 DIAGNOSIS — N2581 Secondary hyperparathyroidism of renal origin: Secondary | ICD-10-CM | POA: Diagnosis not present

## 2019-07-12 DIAGNOSIS — L299 Pruritus, unspecified: Secondary | ICD-10-CM | POA: Diagnosis not present

## 2019-07-12 DIAGNOSIS — N186 End stage renal disease: Secondary | ICD-10-CM | POA: Diagnosis not present

## 2019-07-12 DIAGNOSIS — Z992 Dependence on renal dialysis: Secondary | ICD-10-CM | POA: Diagnosis not present

## 2019-07-14 DIAGNOSIS — Z992 Dependence on renal dialysis: Secondary | ICD-10-CM | POA: Diagnosis not present

## 2019-07-14 DIAGNOSIS — L299 Pruritus, unspecified: Secondary | ICD-10-CM | POA: Diagnosis not present

## 2019-07-14 DIAGNOSIS — N186 End stage renal disease: Secondary | ICD-10-CM | POA: Diagnosis not present

## 2019-07-14 DIAGNOSIS — N2581 Secondary hyperparathyroidism of renal origin: Secondary | ICD-10-CM | POA: Diagnosis not present

## 2019-07-16 DIAGNOSIS — Z992 Dependence on renal dialysis: Secondary | ICD-10-CM | POA: Diagnosis not present

## 2019-07-16 DIAGNOSIS — N2581 Secondary hyperparathyroidism of renal origin: Secondary | ICD-10-CM | POA: Diagnosis not present

## 2019-07-16 DIAGNOSIS — L299 Pruritus, unspecified: Secondary | ICD-10-CM | POA: Diagnosis not present

## 2019-07-16 DIAGNOSIS — N186 End stage renal disease: Secondary | ICD-10-CM | POA: Diagnosis not present

## 2019-07-19 DIAGNOSIS — N2581 Secondary hyperparathyroidism of renal origin: Secondary | ICD-10-CM | POA: Diagnosis not present

## 2019-07-19 DIAGNOSIS — N186 End stage renal disease: Secondary | ICD-10-CM | POA: Diagnosis not present

## 2019-07-19 DIAGNOSIS — Z992 Dependence on renal dialysis: Secondary | ICD-10-CM | POA: Diagnosis not present

## 2019-07-19 DIAGNOSIS — R52 Pain, unspecified: Secondary | ICD-10-CM | POA: Diagnosis not present

## 2019-07-21 DIAGNOSIS — R52 Pain, unspecified: Secondary | ICD-10-CM | POA: Diagnosis not present

## 2019-07-21 DIAGNOSIS — N2581 Secondary hyperparathyroidism of renal origin: Secondary | ICD-10-CM | POA: Diagnosis not present

## 2019-07-21 DIAGNOSIS — Z992 Dependence on renal dialysis: Secondary | ICD-10-CM | POA: Diagnosis not present

## 2019-07-21 DIAGNOSIS — N186 End stage renal disease: Secondary | ICD-10-CM | POA: Diagnosis not present

## 2019-07-23 DIAGNOSIS — N186 End stage renal disease: Secondary | ICD-10-CM | POA: Diagnosis not present

## 2019-07-23 DIAGNOSIS — N2581 Secondary hyperparathyroidism of renal origin: Secondary | ICD-10-CM | POA: Diagnosis not present

## 2019-07-23 DIAGNOSIS — Z992 Dependence on renal dialysis: Secondary | ICD-10-CM | POA: Diagnosis not present

## 2019-07-23 DIAGNOSIS — R52 Pain, unspecified: Secondary | ICD-10-CM | POA: Diagnosis not present

## 2019-07-26 DIAGNOSIS — N2581 Secondary hyperparathyroidism of renal origin: Secondary | ICD-10-CM | POA: Diagnosis not present

## 2019-07-26 DIAGNOSIS — L299 Pruritus, unspecified: Secondary | ICD-10-CM | POA: Diagnosis not present

## 2019-07-26 DIAGNOSIS — Z992 Dependence on renal dialysis: Secondary | ICD-10-CM | POA: Diagnosis not present

## 2019-07-26 DIAGNOSIS — N186 End stage renal disease: Secondary | ICD-10-CM | POA: Diagnosis not present

## 2019-07-27 DIAGNOSIS — I129 Hypertensive chronic kidney disease with stage 1 through stage 4 chronic kidney disease, or unspecified chronic kidney disease: Secondary | ICD-10-CM | POA: Diagnosis not present

## 2019-07-27 DIAGNOSIS — M792 Neuralgia and neuritis, unspecified: Secondary | ICD-10-CM | POA: Diagnosis not present

## 2019-07-27 DIAGNOSIS — N185 Chronic kidney disease, stage 5: Secondary | ICD-10-CM | POA: Diagnosis not present

## 2019-07-27 DIAGNOSIS — Z0000 Encounter for general adult medical examination without abnormal findings: Secondary | ICD-10-CM | POA: Diagnosis not present

## 2019-07-27 DIAGNOSIS — E1121 Type 2 diabetes mellitus with diabetic nephropathy: Secondary | ICD-10-CM | POA: Diagnosis not present

## 2019-07-28 DIAGNOSIS — Z992 Dependence on renal dialysis: Secondary | ICD-10-CM | POA: Diagnosis not present

## 2019-07-28 DIAGNOSIS — N186 End stage renal disease: Secondary | ICD-10-CM | POA: Diagnosis not present

## 2019-07-28 DIAGNOSIS — N2581 Secondary hyperparathyroidism of renal origin: Secondary | ICD-10-CM | POA: Diagnosis not present

## 2019-07-28 DIAGNOSIS — L299 Pruritus, unspecified: Secondary | ICD-10-CM | POA: Diagnosis not present

## 2019-07-30 DIAGNOSIS — N186 End stage renal disease: Secondary | ICD-10-CM | POA: Diagnosis not present

## 2019-07-30 DIAGNOSIS — L299 Pruritus, unspecified: Secondary | ICD-10-CM | POA: Diagnosis not present

## 2019-07-30 DIAGNOSIS — Z992 Dependence on renal dialysis: Secondary | ICD-10-CM | POA: Diagnosis not present

## 2019-07-30 DIAGNOSIS — N2581 Secondary hyperparathyroidism of renal origin: Secondary | ICD-10-CM | POA: Diagnosis not present

## 2019-08-02 DIAGNOSIS — L299 Pruritus, unspecified: Secondary | ICD-10-CM | POA: Diagnosis not present

## 2019-08-02 DIAGNOSIS — E1122 Type 2 diabetes mellitus with diabetic chronic kidney disease: Secondary | ICD-10-CM | POA: Diagnosis not present

## 2019-08-02 DIAGNOSIS — N2581 Secondary hyperparathyroidism of renal origin: Secondary | ICD-10-CM | POA: Diagnosis not present

## 2019-08-02 DIAGNOSIS — N186 End stage renal disease: Secondary | ICD-10-CM | POA: Diagnosis not present

## 2019-08-02 DIAGNOSIS — Z992 Dependence on renal dialysis: Secondary | ICD-10-CM | POA: Diagnosis not present

## 2019-08-03 DIAGNOSIS — I319 Disease of pericardium, unspecified: Secondary | ICD-10-CM

## 2019-08-03 HISTORY — DX: Disease of pericardium, unspecified: I31.9

## 2019-08-04 DIAGNOSIS — N186 End stage renal disease: Secondary | ICD-10-CM | POA: Diagnosis not present

## 2019-08-04 DIAGNOSIS — N2581 Secondary hyperparathyroidism of renal origin: Secondary | ICD-10-CM | POA: Diagnosis not present

## 2019-08-04 DIAGNOSIS — L299 Pruritus, unspecified: Secondary | ICD-10-CM | POA: Diagnosis not present

## 2019-08-04 DIAGNOSIS — Z992 Dependence on renal dialysis: Secondary | ICD-10-CM | POA: Diagnosis not present

## 2019-08-06 DIAGNOSIS — Z992 Dependence on renal dialysis: Secondary | ICD-10-CM | POA: Diagnosis not present

## 2019-08-06 DIAGNOSIS — L299 Pruritus, unspecified: Secondary | ICD-10-CM | POA: Diagnosis not present

## 2019-08-06 DIAGNOSIS — N2581 Secondary hyperparathyroidism of renal origin: Secondary | ICD-10-CM | POA: Diagnosis not present

## 2019-08-06 DIAGNOSIS — N186 End stage renal disease: Secondary | ICD-10-CM | POA: Diagnosis not present

## 2019-08-09 DIAGNOSIS — N186 End stage renal disease: Secondary | ICD-10-CM | POA: Diagnosis not present

## 2019-08-09 DIAGNOSIS — Z992 Dependence on renal dialysis: Secondary | ICD-10-CM | POA: Diagnosis not present

## 2019-08-09 DIAGNOSIS — N2581 Secondary hyperparathyroidism of renal origin: Secondary | ICD-10-CM | POA: Diagnosis not present

## 2019-08-11 DIAGNOSIS — Z992 Dependence on renal dialysis: Secondary | ICD-10-CM | POA: Diagnosis not present

## 2019-08-11 DIAGNOSIS — N186 End stage renal disease: Secondary | ICD-10-CM | POA: Diagnosis not present

## 2019-08-11 DIAGNOSIS — N2581 Secondary hyperparathyroidism of renal origin: Secondary | ICD-10-CM | POA: Diagnosis not present

## 2019-08-13 DIAGNOSIS — Z992 Dependence on renal dialysis: Secondary | ICD-10-CM | POA: Diagnosis not present

## 2019-08-13 DIAGNOSIS — N2581 Secondary hyperparathyroidism of renal origin: Secondary | ICD-10-CM | POA: Diagnosis not present

## 2019-08-13 DIAGNOSIS — N186 End stage renal disease: Secondary | ICD-10-CM | POA: Diagnosis not present

## 2019-08-16 DIAGNOSIS — Z992 Dependence on renal dialysis: Secondary | ICD-10-CM | POA: Diagnosis not present

## 2019-08-16 DIAGNOSIS — N2581 Secondary hyperparathyroidism of renal origin: Secondary | ICD-10-CM | POA: Diagnosis not present

## 2019-08-16 DIAGNOSIS — Z23 Encounter for immunization: Secondary | ICD-10-CM | POA: Diagnosis not present

## 2019-08-16 DIAGNOSIS — N186 End stage renal disease: Secondary | ICD-10-CM | POA: Diagnosis not present

## 2019-08-18 DIAGNOSIS — Z23 Encounter for immunization: Secondary | ICD-10-CM | POA: Diagnosis not present

## 2019-08-18 DIAGNOSIS — N186 End stage renal disease: Secondary | ICD-10-CM | POA: Diagnosis not present

## 2019-08-18 DIAGNOSIS — N2581 Secondary hyperparathyroidism of renal origin: Secondary | ICD-10-CM | POA: Diagnosis not present

## 2019-08-18 DIAGNOSIS — Z992 Dependence on renal dialysis: Secondary | ICD-10-CM | POA: Diagnosis not present

## 2019-08-20 DIAGNOSIS — Z23 Encounter for immunization: Secondary | ICD-10-CM | POA: Diagnosis not present

## 2019-08-20 DIAGNOSIS — Z992 Dependence on renal dialysis: Secondary | ICD-10-CM | POA: Diagnosis not present

## 2019-08-20 DIAGNOSIS — N186 End stage renal disease: Secondary | ICD-10-CM | POA: Diagnosis not present

## 2019-08-20 DIAGNOSIS — N2581 Secondary hyperparathyroidism of renal origin: Secondary | ICD-10-CM | POA: Diagnosis not present

## 2019-08-23 DIAGNOSIS — N186 End stage renal disease: Secondary | ICD-10-CM | POA: Diagnosis not present

## 2019-08-23 DIAGNOSIS — Z992 Dependence on renal dialysis: Secondary | ICD-10-CM | POA: Diagnosis not present

## 2019-08-23 DIAGNOSIS — N2581 Secondary hyperparathyroidism of renal origin: Secondary | ICD-10-CM | POA: Diagnosis not present

## 2019-08-24 ENCOUNTER — Emergency Department (HOSPITAL_COMMUNITY): Payer: BC Managed Care – PPO

## 2019-08-24 ENCOUNTER — Other Ambulatory Visit: Payer: Self-pay

## 2019-08-24 ENCOUNTER — Emergency Department (HOSPITAL_COMMUNITY)
Admission: EM | Admit: 2019-08-24 | Discharge: 2019-08-25 | Disposition: A | Discharge status: Home / Self Care | Payer: BC Managed Care – PPO | Source: Home / Self Care | Attending: Emergency Medicine | Admitting: Emergency Medicine

## 2019-08-24 ENCOUNTER — Encounter (HOSPITAL_COMMUNITY): Payer: Self-pay | Admitting: Emergency Medicine

## 2019-08-24 DIAGNOSIS — I120 Hypertensive chronic kidney disease with stage 5 chronic kidney disease or end stage renal disease: Secondary | ICD-10-CM | POA: Diagnosis not present

## 2019-08-24 DIAGNOSIS — I129 Hypertensive chronic kidney disease with stage 1 through stage 4 chronic kidney disease, or unspecified chronic kidney disease: Secondary | ICD-10-CM | POA: Diagnosis not present

## 2019-08-24 DIAGNOSIS — R0782 Intercostal pain: Secondary | ICD-10-CM | POA: Diagnosis not present

## 2019-08-24 DIAGNOSIS — I313 Pericardial effusion (noninflammatory): Secondary | ICD-10-CM | POA: Diagnosis not present

## 2019-08-24 DIAGNOSIS — J939 Pneumothorax, unspecified: Secondary | ICD-10-CM | POA: Diagnosis not present

## 2019-08-24 DIAGNOSIS — J90 Pleural effusion, not elsewhere classified: Secondary | ICD-10-CM | POA: Diagnosis not present

## 2019-08-24 DIAGNOSIS — T82898A Other specified complication of vascular prosthetic devices, implants and grafts, initial encounter: Secondary | ICD-10-CM | POA: Diagnosis not present

## 2019-08-24 DIAGNOSIS — R5381 Other malaise: Secondary | ICD-10-CM | POA: Diagnosis not present

## 2019-08-24 DIAGNOSIS — Z20828 Contact with and (suspected) exposure to other viral communicable diseases: Secondary | ICD-10-CM | POA: Diagnosis not present

## 2019-08-24 DIAGNOSIS — I700 Atherosclerosis of aorta: Secondary | ICD-10-CM | POA: Diagnosis present

## 2019-08-24 DIAGNOSIS — E785 Hyperlipidemia, unspecified: Secondary | ICD-10-CM | POA: Diagnosis not present

## 2019-08-24 DIAGNOSIS — R000 Tachycardia, unspecified: Secondary | ICD-10-CM | POA: Diagnosis not present

## 2019-08-24 DIAGNOSIS — E669 Obesity, unspecified: Secondary | ICD-10-CM | POA: Diagnosis not present

## 2019-08-24 DIAGNOSIS — R0789 Other chest pain: Secondary | ICD-10-CM | POA: Diagnosis not present

## 2019-08-24 DIAGNOSIS — I2510 Atherosclerotic heart disease of native coronary artery without angina pectoris: Secondary | ICD-10-CM | POA: Diagnosis not present

## 2019-08-24 DIAGNOSIS — J45909 Unspecified asthma, uncomplicated: Secondary | ICD-10-CM | POA: Insufficient documentation

## 2019-08-24 DIAGNOSIS — I959 Hypotension, unspecified: Secondary | ICD-10-CM | POA: Diagnosis not present

## 2019-08-24 DIAGNOSIS — D638 Anemia in other chronic diseases classified elsewhere: Secondary | ICD-10-CM | POA: Diagnosis not present

## 2019-08-24 DIAGNOSIS — I318 Other specified diseases of pericardium: Secondary | ICD-10-CM | POA: Diagnosis not present

## 2019-08-24 DIAGNOSIS — I953 Hypotension of hemodialysis: Secondary | ICD-10-CM | POA: Diagnosis not present

## 2019-08-24 DIAGNOSIS — R570 Cardiogenic shock: Secondary | ICD-10-CM | POA: Diagnosis not present

## 2019-08-24 DIAGNOSIS — I319 Disease of pericardium, unspecified: Secondary | ICD-10-CM | POA: Diagnosis not present

## 2019-08-24 DIAGNOSIS — I950 Idiopathic hypotension: Secondary | ICD-10-CM

## 2019-08-24 DIAGNOSIS — I951 Orthostatic hypotension: Secondary | ICD-10-CM | POA: Diagnosis not present

## 2019-08-24 DIAGNOSIS — I314 Cardiac tamponade: Secondary | ICD-10-CM | POA: Diagnosis not present

## 2019-08-24 DIAGNOSIS — R0602 Shortness of breath: Secondary | ICD-10-CM | POA: Diagnosis not present

## 2019-08-24 DIAGNOSIS — I309 Acute pericarditis, unspecified: Secondary | ICD-10-CM | POA: Diagnosis not present

## 2019-08-24 DIAGNOSIS — G4733 Obstructive sleep apnea (adult) (pediatric): Secondary | ICD-10-CM | POA: Diagnosis present

## 2019-08-24 DIAGNOSIS — J9811 Atelectasis: Secondary | ICD-10-CM | POA: Diagnosis not present

## 2019-08-24 DIAGNOSIS — Z992 Dependence on renal dialysis: Secondary | ICD-10-CM | POA: Diagnosis not present

## 2019-08-24 DIAGNOSIS — E282 Polycystic ovarian syndrome: Secondary | ICD-10-CM | POA: Diagnosis present

## 2019-08-24 DIAGNOSIS — D72829 Elevated white blood cell count, unspecified: Secondary | ICD-10-CM | POA: Diagnosis not present

## 2019-08-24 DIAGNOSIS — I480 Paroxysmal atrial fibrillation: Secondary | ICD-10-CM | POA: Diagnosis not present

## 2019-08-24 DIAGNOSIS — Z7901 Long term (current) use of anticoagulants: Secondary | ICD-10-CM | POA: Diagnosis not present

## 2019-08-24 DIAGNOSIS — D631 Anemia in chronic kidney disease: Secondary | ICD-10-CM | POA: Diagnosis not present

## 2019-08-24 DIAGNOSIS — Z7982 Long term (current) use of aspirin: Secondary | ICD-10-CM | POA: Insufficient documentation

## 2019-08-24 DIAGNOSIS — Y712 Prosthetic and other implants, materials and accessory cardiovascular devices associated with adverse incidents: Secondary | ICD-10-CM | POA: Diagnosis present

## 2019-08-24 DIAGNOSIS — K5900 Constipation, unspecified: Secondary | ICD-10-CM | POA: Diagnosis not present

## 2019-08-24 DIAGNOSIS — I25119 Atherosclerotic heart disease of native coronary artery with unspecified angina pectoris: Secondary | ICD-10-CM | POA: Diagnosis not present

## 2019-08-24 DIAGNOSIS — Z79899 Other long term (current) drug therapy: Secondary | ICD-10-CM | POA: Insufficient documentation

## 2019-08-24 DIAGNOSIS — I300 Acute nonspecific idiopathic pericarditis: Secondary | ICD-10-CM | POA: Diagnosis not present

## 2019-08-24 DIAGNOSIS — M1612 Unilateral primary osteoarthritis, left hip: Secondary | ICD-10-CM | POA: Diagnosis not present

## 2019-08-24 DIAGNOSIS — R001 Bradycardia, unspecified: Secondary | ICD-10-CM | POA: Diagnosis not present

## 2019-08-24 DIAGNOSIS — D62 Acute posthemorrhagic anemia: Secondary | ICD-10-CM | POA: Diagnosis not present

## 2019-08-24 DIAGNOSIS — E1122 Type 2 diabetes mellitus with diabetic chronic kidney disease: Secondary | ICD-10-CM | POA: Diagnosis not present

## 2019-08-24 DIAGNOSIS — R079 Chest pain, unspecified: Secondary | ICD-10-CM | POA: Diagnosis not present

## 2019-08-24 DIAGNOSIS — I9589 Other hypotension: Secondary | ICD-10-CM | POA: Diagnosis not present

## 2019-08-24 DIAGNOSIS — G8918 Other acute postprocedural pain: Secondary | ICD-10-CM | POA: Diagnosis not present

## 2019-08-24 DIAGNOSIS — I472 Ventricular tachycardia: Secondary | ICD-10-CM | POA: Diagnosis not present

## 2019-08-24 DIAGNOSIS — Z96641 Presence of right artificial hip joint: Secondary | ICD-10-CM | POA: Diagnosis not present

## 2019-08-24 DIAGNOSIS — N186 End stage renal disease: Secondary | ICD-10-CM | POA: Diagnosis not present

## 2019-08-24 DIAGNOSIS — R918 Other nonspecific abnormal finding of lung field: Secondary | ICD-10-CM | POA: Diagnosis not present

## 2019-08-24 DIAGNOSIS — E118 Type 2 diabetes mellitus with unspecified complications: Secondary | ICD-10-CM | POA: Diagnosis not present

## 2019-08-24 DIAGNOSIS — N2581 Secondary hyperparathyroidism of renal origin: Secondary | ICD-10-CM | POA: Diagnosis not present

## 2019-08-24 DIAGNOSIS — R072 Precordial pain: Secondary | ICD-10-CM | POA: Diagnosis not present

## 2019-08-24 DIAGNOSIS — E1121 Type 2 diabetes mellitus with diabetic nephropathy: Secondary | ICD-10-CM | POA: Diagnosis not present

## 2019-08-24 DIAGNOSIS — M25552 Pain in left hip: Secondary | ICD-10-CM | POA: Diagnosis not present

## 2019-08-24 DIAGNOSIS — I493 Ventricular premature depolarization: Secondary | ICD-10-CM | POA: Diagnosis not present

## 2019-08-24 DIAGNOSIS — Z4682 Encounter for fitting and adjustment of non-vascular catheter: Secondary | ICD-10-CM | POA: Diagnosis not present

## 2019-08-24 LAB — BASIC METABOLIC PANEL
Anion gap: 15 (ref 5–15)
BUN: 40 mg/dL — ABNORMAL HIGH (ref 8–23)
CO2: 29 mmol/L (ref 22–32)
Calcium: 8 mg/dL — ABNORMAL LOW (ref 8.9–10.3)
Chloride: 93 mmol/L — ABNORMAL LOW (ref 98–111)
Creatinine, Ser: 9.73 mg/dL — ABNORMAL HIGH (ref 0.44–1.00)
GFR calc Af Amer: 4 mL/min — ABNORMAL LOW (ref >60)
GFR calc non Af Amer: 4 mL/min — ABNORMAL LOW (ref >60)
Glucose, Bld: 93 mg/dL (ref 70–99)
Potassium: 4.6 mmol/L (ref 3.5–5.1)
Sodium: 137 mmol/L (ref 135–145)

## 2019-08-24 LAB — CBC WITH DIFFERENTIAL/PLATELET
Abs Immature Granulocytes: 0.03 10*3/uL (ref 0.00–0.07)
Basophils Absolute: 0 10*3/uL (ref 0.0–0.1)
Basophils Relative: 0 %
Eosinophils Absolute: 0 10*3/uL (ref 0.0–0.5)
Eosinophils Relative: 0 %
HCT: 42.3 % (ref 36.0–46.0)
Hemoglobin: 13.1 g/dL (ref 12.0–15.0)
Immature Granulocytes: 0 %
Lymphocytes Relative: 14 %
Lymphs Abs: 1.2 10*3/uL (ref 0.7–4.0)
MCH: 32 pg (ref 26.0–34.0)
MCHC: 31 g/dL (ref 30.0–36.0)
MCV: 103.4 fL — ABNORMAL HIGH (ref 80.0–100.0)
Monocytes Absolute: 1.1 10*3/uL — ABNORMAL HIGH (ref 0.1–1.0)
Monocytes Relative: 12 %
Neutro Abs: 6.5 10*3/uL (ref 1.7–7.7)
Neutrophils Relative %: 74 %
Platelets: 147 10*3/uL — ABNORMAL LOW (ref 150–400)
RBC: 4.09 MIL/uL (ref 3.87–5.11)
RDW: 16.5 % — ABNORMAL HIGH (ref 11.5–15.5)
WBC: 8.9 10*3/uL (ref 4.0–10.5)
nRBC: 0 % (ref 0.0–0.2)

## 2019-08-24 LAB — TROPONIN I (HIGH SENSITIVITY)
Troponin I (High Sensitivity): 7 ng/L (ref <18)
Troponin I (High Sensitivity): 8 ng/L (ref <18)

## 2019-08-24 LAB — I-STAT CHEM 8, ED
BUN: 42 mg/dL — ABNORMAL HIGH (ref 8–23)
Calcium, Ion: 0.91 mmol/L — ABNORMAL LOW (ref 1.15–1.40)
Chloride: 95 mmol/L — ABNORMAL LOW (ref 98–111)
Creatinine, Ser: 9.9 mg/dL — ABNORMAL HIGH (ref 0.44–1.00)
Glucose, Bld: 86 mg/dL (ref 70–99)
HCT: 43 % (ref 36.0–46.0)
Hemoglobin: 14.6 g/dL (ref 12.0–15.0)
Potassium: 4.4 mmol/L (ref 3.5–5.1)
Sodium: 137 mmol/L (ref 135–145)
TCO2: 37 mmol/L — ABNORMAL HIGH (ref 22–32)

## 2019-08-24 MED ORDER — IOHEXOL 350 MG/ML SOLN
100.0000 mL | Freq: Once | INTRAVENOUS | Status: AC | PRN
  Administered 2019-08-24: 23:00:00 100 mL via INTRAVENOUS

## 2019-08-24 MED ORDER — ACETAMINOPHEN 500 MG PO TABS
500.0000 mg | ORAL_TABLET | Freq: Once | ORAL | Status: AC
  Administered 2019-08-24: 500 mg via ORAL
  Filled 2019-08-24: qty 1

## 2019-08-24 MED ORDER — LIDOCAINE VISCOUS HCL 2 % MT SOLN
15.0000 mL | Freq: Once | OROMUCOSAL | Status: AC
  Administered 2019-08-24: 15 mL via OROMUCOSAL
  Filled 2019-08-24: qty 15

## 2019-08-24 MED ORDER — SODIUM CHLORIDE 0.9 % IV BOLUS
250.0000 mL | Freq: Once | INTRAVENOUS | Status: AC
  Administered 2019-08-24: 250 mL via INTRAVENOUS

## 2019-08-24 MED ORDER — FENTANYL CITRATE (PF) 100 MCG/2ML IJ SOLN
50.0000 ug | Freq: Once | INTRAMUSCULAR | Status: AC
  Administered 2019-08-24: 50 ug via INTRAVENOUS
  Filled 2019-08-24: qty 2

## 2019-08-24 MED ORDER — MIDODRINE HCL 5 MG PO TABS
5.0000 mg | ORAL_TABLET | Freq: Once | ORAL | Status: AC
  Administered 2019-08-25: 5 mg via ORAL
  Filled 2019-08-24: qty 1

## 2019-08-24 NOTE — ED Provider Notes (Signed)
Patient signed out at end of shift by Margarita Mail, PA-C. She presented tiwth CP that started yesterday. Constant, nonradiating. EKG unchanged from previous  She feels she is having reflux symptoms no better with TUMS Heart Score 4, 2 negative trops here On re-evaluation - emotionally upset, cries with persistent symptoms and is found to be hypotensive.   Takes midodrine for known hypotension at each session of dialysis to raise pressure. Midodrine given here.  Chest/abd/pel scan performed for all encompassing evaluation - scans do not show acute process ESRD - due tomorrow Working diagnosis is undifferentiated CP, now histrionic appearing  Plan: re-evaluation after midodrine, check mental/emotional status. Admit vs discharge to be determined.   The patient is given Protonix as well as aspirin. Her blood pressure improves to 98/57 on last recheck. She ambulates without change in blood pressure, lightheadedness, worsening chest symptoms or diaphoresis. She reports her pain has subsided. Chart reviewed and documentation supports chronic hypotension c/w majority of readings in the ED.   The patient has dialysis at 5:00 am. She arrived from her doctors office across the street from the hospital which is where her car is. Security will take the patient to her car and she will go to dialysis from there. Discussed this plan with Dr. Roxanne Mins who agrees.      Charlann Lange, PA-C [phone]    Delora Fuel, MD [phone]

## 2019-08-24 NOTE — ED Notes (Signed)
Pt not available at this time for assessment.

## 2019-08-24 NOTE — Discharge Instructions (Addendum)
Your caregiver has diagnosed you as having chest pain that is not specific for one problem, but does not require admission.  You are at low risk for an acute heart condition or other serious illness because of your other medical problems. Chest pain comes from many different causes.  SEEK IMMEDIATE MEDICAL ATTENTION IF: You have severe chest pain, especially if the pain is crushing or pressure-like and spreads to the arms, back, neck, or jaw, or if you have sweating, nausea (feeling sick to your stomach), or shortness of breath. THIS IS AN EMERGENCY. Don't wait to see if the pain will go away. Get medical help at once. Call 911 or [phone] (operator). DO NOT drive yourself to the hospital.  Your chest pain gets worse and does not go away with rest.  You have an attack of chest pain lasting longer than usual, despite rest and treatment with the medications your caregiver has prescribed.  You wake from sleep with chest pain or shortness of breath.  You feel dizzy or faint.  You have chest pain not typical of your usual pain for which you originally saw your caregiver.

## 2019-08-24 NOTE — ED Notes (Signed)
Patient transported to CT 

## 2019-08-24 NOTE — ED Notes (Signed)
Pt is taken to CT.

## 2019-08-24 NOTE — ED Provider Notes (Signed)
[facility] EMERGENCY DEPARTMENT Provider Note   CSN: [id number] Arrival date & time: 08/24/19  1714     History   Chief Complaint Chief Complaint  Patient presents with  . Chest Pain    HPI Brenda Arnold is a 63 y.o. female BIB [facility] from her PCP office for sxs of Indigestion. The patient c/o generalized CP and indigestion sxs. Pain is non-radiating. She took tums without relief. She goes to dialysis M/W/F and had full tx yesterday. Her sxs are when she has to push herself off the bed and use her chest muscles to sit up.  She denies any knowledge of strain injury.  Denies n/v/diaphoresis.       HPI  Past Medical History:  Diagnosis Date  . Anemia   . Arthritis   . Asthma   . Complication of anesthesia    difficulty with getting oxygen saturation up  . Diabetes mellitus   . Dyslipidemia   . Epistaxis 11/05/2012  . ESRD (end stage renal disease) on dialysis [facility])    "MWF; [facility] Rinks" (09/15/2018)  . History of nuclear stress test    Myoview 5/18: EF 68, no infarct or ischemia, low risk  . HTN (hypertension)   . Hyperlipidemia   . Hyperparathyroidism due to renal insufficiency ([place])   . Obesity    s/p panniculectomy  . Paroxysmal A-fib ([facility])    a. chronic coumadin;  b. 12/2009 Echo: EF 60-65%, Gr 1 DD.  Marland Kitchen PCOS (polycystic ovarian syndrome)   . Pneumonia   . Seasonal allergies   . Sleep apnea    a. not using CPAP, last study  >8 yrs  . Vitamin D deficiency     Patient Active Problem List   Diagnosis Date Noted  . CAD (coronary artery disease) 11/05/2018  . A-fib ([place]) 11/05/2018  . Atrial fibrillation with RVR ([place])   . Hypotension 11/04/2018  . ESRD on hemodialysis ([place]) 11/04/2018  . DVT (deep venous thrombosis) ([place]) 11/03/2018  . Primary osteoarthritis of right hip 09/15/2018  . Status post total replacement of right hip 09/15/2018  . Varicose veins of bilateral lower extremities with other complications 0000000  .  Anticoagulation goal of INR 2 to 3 09/30/2014  . Pain in lower limb 05/02/2014  . Onychomycosis due to dermatophyte 04/06/2013  . Pain in joint, ankle and foot 04/06/2013  . Bradycardia 11/06/2012  . Left-sided epistaxis 11/05/2012  . DM (diabetes mellitus) ([place]) 11/05/2012  . OSA (obstructive sleep apnea) 11/05/2012  . Acute posterior epistaxis 11/05/2012  . CKD (chronic kidney disease) 11/05/2012  . End stage renal disease ([place]) 06/25/2012  . Type 2 diabetes mellitus with complication, without long-term current use of insulin ([place]) 12/20/2009  . OBESITY 12/20/2009  . OBESITY-MORBID (>100') 12/20/2009  . Essential hypertension 12/20/2009  . Paroxysmal atrial fibrillation ([place]) 12/20/2009  . Chest pain 12/20/2009  . ABNORMAL CV (STRESS) TEST 12/20/2009    Past Surgical History:  Procedure Laterality Date  . ABDOMINAL HYSTERECTOMY     with panniculctomy  . AV FISTULA PLACEMENT  09/02/2012   Procedure: ARTERIOVENOUS (AV) FISTULA CREATION;  Surgeon: Elam Dutch, MD;  Location: [facility] OR;  Service: Vascular;  Laterality: Left;  Creation of Left Radial-Cephalic Fistula   . BREAST SURGERY     Biopsy right breast  . COLONOSCOPY W/ BIOPSIES AND POLYPECTOMY    . DILATION AND CURETTAGE OF UTERUS    . KNEE ARTHROSCOPY Right   . REVISON OF ARTERIOVENOUS FISTULA Left 04/27/2014  Procedure: REVISON OF LEFT RADIAL-CEPHALIC ARTERIOVENOUS FISTULA;  Surgeon: Mal Misty, MD;  Location: [facility];  Service: Vascular;  Laterality: Left;  . TOTAL HIP ARTHROPLASTY Right 09/15/2018  . TOTAL HIP ARTHROPLASTY Right 09/15/2018   Procedure: RIGHT TOTAL HIP ARTHROPLASTY ANTERIOR APPROACH;  Surgeon: Leandrew Koyanagi, MD;  Location: [facility];  Service: Orthopedics;  Laterality: Right;  . UVULOPLASTY       OB History   No obstetric history on file.      Home Medications    Prior to Admission medications   Medication Sig Start Date End Date Taking? Authorizing Provider  aspirin EC 81 MG tablet Take 1  tablet (81 mg total) by mouth 2 (two) times daily. 09/15/18   Leandrew Koyanagi, MD  atorvastatin (LIPITOR) 40 MG tablet Take 1 tablet (40 mg total) by mouth daily at 6 PM. 11/09/18   Georgette Shell, MD  B Complex-C-Folic Acid (DIALYVITE TABLET) TABS Take 1 tablet by mouth at bedtime.  06/03/18   [provider]  calcitRIOL (ROCALTROL) 0.5 MCG capsule Take 3 capsules (1.5 mcg total) by mouth 3 (three) times a week. 11/11/18   Georgette Shell, MD  ELIQUIS 5 MG TABS tablet  12/19/18   [provider]  ethyl chloride spray Apply 1 application topically See admin instructions. For dialysis 06/25/18   [provider]  gabapentin (NEURONTIN) 100 MG capsule Take 100 mg by mouth at bedtime. 05/14/17   [provider]  methocarbamol (ROBAXIN) 500 MG tablet Take 1 tablet (500 mg total) by mouth every 6 (six) hours as needed for muscle spasms. 09/15/18   Leandrew Koyanagi, MD  midodrine (PROAMATINE) 5 MG tablet  02/10/19   [provider]  ondansetron (ZOFRAN) 4 MG tablet Take 1-2 tablets (4-8 mg total) by mouth every 8 (eight) hours as needed for nausea or vomiting. 09/15/18   Leandrew Koyanagi, MD  oxyCODONE (OXY IR/ROXICODONE) 5 MG immediate release tablet Take 1-3 tablets (5-15 mg total) by mouth every 4 (four) hours as needed. 09/15/18   Leandrew Koyanagi, MD  predniSONE (DELTASONE) 20 MG tablet TAKE 1 TABLET 2 TIMES PER DAY FOR 3 DAYS, 1 TIME PER DAY FOR 3 DAYS ORALLY 02/01/19   [provider]  promethazine (PHENERGAN) 25 MG tablet Take 1 tablet (25 mg total) by mouth every 6 (six) hours as needed for nausea. 09/15/18   Leandrew Koyanagi, MD  senna-docusate (SENOKOT S) 8.6-50 MG tablet Take 1 tablet by mouth at bedtime as needed. 09/15/18   Leandrew Koyanagi, MD  sucroferric oxyhydroxide (VELPHORO) 500 MG chewable tablet Chew 2 capsules by mouth See admin instructions. 2 each three times daily with meals, and 2 for 1 daily snack    [provider]  warfarin  (COUMADIN) 7.5 MG tablet Take 1 tablet (7.5 mg total) by mouth daily. 11/09/18 11/09/19  Georgette Shell, MD    Family History Family History  Problem Relation Age of Onset  . Lung cancer Father        died @ 49  . Hypertension Mother        alive @ 49  . Diabetes Brother   . Kidney disease Brother     Social History Social History   Tobacco Use  . Smoking status: Never Smoker  . Smokeless tobacco: Never Used  Substance Use Topics  . Alcohol use: No    Alcohol/week: 0.0 standard drinks  . Drug use: No     Allergies  Iodine, Shellfish allergy, and Norvasc [amlodipine]   Review of Systems Review of Systems  Ten systems reviewed and are negative for acute change, except as noted in the HPI.   Physical Exam Updated Vital Signs There were no vitals taken for this visit.  Physical Exam Vitals signs and nursing note reviewed.  Constitutional:      General: She is not in acute distress.    Appearance: She is well-developed. She is not diaphoretic.  HENT:     Head: Normocephalic and atraumatic.  Eyes:     General: No scleral icterus.    Conjunctiva/sclera: Conjunctivae normal.  Neck:     Musculoskeletal: Normal range of motion.  Cardiovascular:     Rate and Rhythm: Normal rate and regular rhythm.     Heart sounds: Normal heart sounds. No murmur. No friction rub. No gallop.   Pulmonary:     Effort: Pulmonary effort is normal. No respiratory distress.     Breath sounds: Normal breath sounds.  Abdominal:     General: Bowel sounds are normal. There is no distension.     Palpations: Abdomen is soft. There is no mass.     Tenderness: There is no abdominal tenderness. There is no guarding.  Skin:    General: Skin is warm and dry.  Neurological:     Mental Status: She is alert and oriented to person, place, and time.  Psychiatric:        Behavior: Behavior normal.      ED Treatments / Results  Labs (all labs ordered are listed, but only abnormal results  are displayed) Labs Reviewed  SARS CORONAVIRUS 2 (TAT 6-24 HRS)  BASIC METABOLIC PANEL  CBC WITH DIFFERENTIAL/PLATELET  URINALYSIS, ROUTINE W REFLEX MICROSCOPIC  I-STAT CHEM 8, ED  TROPONIN I (HIGH SENSITIVITY)    EKG EKG Interpretation  Date/Time:  Tuesday August 24 2019 17:23:24 EDT Ventricular Rate:  100 PR Interval:    QRS Duration: 82 QT Interval:  373 QTC Calculation: 482 R Axis:   -72 Text Interpretation:  Sinus tachycardia Probable inferior infarct, acute Consider anterolateral infarct Confirmed by Veryl Speak [phone]) on 08/24/2019 5:26:36 PM   Radiology No results found.  Procedures Procedures (including critical care time)  Medications Ordered in ED Medications - No data to display   Initial Impression / Assessment and Plan / ED Course  I have reviewed the triage vital signs and the nursing notes.  Pertinent labs & imaging results that were available during my care of the patient were reviewed by me and considered in my medical decision making (see chart for details).  Clinical Course as of Aug 24 1708  Tue Aug 24, 2019  2337 CT Angio Chest/Abd/Pel for Dissection W and/or Wo Contrast [AH]  2351 CT Angio Chest/Abd/Pel for Dissection W and/or Wo Contrast [AH]    Clinical Course User Index [AH] Margarita Mail, PA-C       63 y/o F with a pmh of  ESRD, DM.  The emergent differential diagnosis of chest pain includes: Acute coronary syndrome, pericarditis, aortic dissection, pulmonary embolism, tension pneumothorax, pneumonia, and esophageal rupture. She was sent in by her pcp. HEART score is 4. Labs show no evidence of Hyperkalemia. Creatinine is at baseine. The patient has had 2 negative troponins and constant chest pain for 24 hours. I reviewed the EKG which is abnormal but unchanged from previous.The patient has had labile blood pressures, frequently hypotensive, but states that she has not take her midodrine. I personally She had a  precipetous decline  in her Blood pressure after getting Fentanyl. I was set to discharge the patient, but when I went to discuss discharge the patient was crying and looked very uncomfortable. She was now complaining of pain with breathing whereas before she stated that the pain was worse " when I use my chest muscles." I ordered a CTA Ch/Ab/Pl and asked The Dr. Stark Jock see the patient.  I have ordered her usual midodrine. I have low suspicion for ACS given her constant CP without any elevation in her troponins > 24 hours. The patient is c/o reflux sxs and I have ordered viscous lidocaine. Given her hypotension I am witholding further narcotic pain meds.  I have given sign out to Dr. Roxanne Mins and PA Phoebe Sharps to follow up on the CT findings and reevaluate after medications. Final Clinical Impressions(s) / ED Diagnoses   Final diagnoses:  None    ED Discharge Orders    None       Margarita Mail, PA-C 08/25/19 1743    Veryl Speak, MD 08/28/19 [phone]

## 2019-08-24 NOTE — ED Triage Notes (Signed)
Per [facility],  Pt has generalized CP, no radiation since yesterday. She felt it was indigestion, took Tums and drank ginger ale with no relief. Pt is HD pt, last tx yesterday, had full tx. Pt reports it hurts to lay back or take a deep breath.

## 2019-08-24 NOTE — ED Notes (Signed)
Dr. Delo at bedside. 

## 2019-08-25 ENCOUNTER — Emergency Department (HOSPITAL_COMMUNITY): Payer: BC Managed Care – PPO

## 2019-08-25 ENCOUNTER — Encounter (HOSPITAL_COMMUNITY): Payer: Self-pay | Admitting: Emergency Medicine

## 2019-08-25 ENCOUNTER — Other Ambulatory Visit: Payer: Self-pay

## 2019-08-25 ENCOUNTER — Inpatient Hospital Stay (HOSPITAL_COMMUNITY)
Admission: EM | Admit: 2019-08-25 | Discharge: 2019-09-09 | DRG: 270 | Disposition: A | Discharge status: Rehabilitation Facility | Payer: BC Managed Care – PPO | Source: Other Acute Inpatient Hospital | Attending: Internal Medicine | Admitting: Internal Medicine

## 2019-08-25 DIAGNOSIS — Z20828 Contact with and (suspected) exposure to other viral communicable diseases: Secondary | ICD-10-CM | POA: Diagnosis present

## 2019-08-25 DIAGNOSIS — I959 Hypotension, unspecified: Secondary | ICD-10-CM

## 2019-08-25 DIAGNOSIS — Z992 Dependence on renal dialysis: Secondary | ICD-10-CM

## 2019-08-25 DIAGNOSIS — Z833 Family history of diabetes mellitus: Secondary | ICD-10-CM

## 2019-08-25 DIAGNOSIS — Z888 Allergy status to other drugs, medicaments and biological substances status: Secondary | ICD-10-CM

## 2019-08-25 DIAGNOSIS — I493 Ventricular premature depolarization: Secondary | ICD-10-CM | POA: Diagnosis present

## 2019-08-25 DIAGNOSIS — Z9689 Presence of other specified functional implants: Secondary | ICD-10-CM

## 2019-08-25 DIAGNOSIS — D62 Acute posthemorrhagic anemia: Secondary | ICD-10-CM | POA: Diagnosis not present

## 2019-08-25 DIAGNOSIS — Z91041 Radiographic dye allergy status: Secondary | ICD-10-CM

## 2019-08-25 DIAGNOSIS — R570 Cardiogenic shock: Secondary | ICD-10-CM | POA: Diagnosis not present

## 2019-08-25 DIAGNOSIS — N2581 Secondary hyperparathyroidism of renal origin: Secondary | ICD-10-CM | POA: Diagnosis present

## 2019-08-25 DIAGNOSIS — I209 Angina pectoris, unspecified: Secondary | ICD-10-CM | POA: Insufficient documentation

## 2019-08-25 DIAGNOSIS — N186 End stage renal disease: Secondary | ICD-10-CM | POA: Diagnosis not present

## 2019-08-25 DIAGNOSIS — Z86010 Personal history of colonic polyps: Secondary | ICD-10-CM

## 2019-08-25 DIAGNOSIS — J939 Pneumothorax, unspecified: Secondary | ICD-10-CM

## 2019-08-25 DIAGNOSIS — Z7901 Long term (current) use of anticoagulants: Secondary | ICD-10-CM

## 2019-08-25 DIAGNOSIS — Z79899 Other long term (current) drug therapy: Secondary | ICD-10-CM

## 2019-08-25 DIAGNOSIS — I120 Hypertensive chronic kidney disease with stage 5 chronic kidney disease or end stage renal disease: Secondary | ICD-10-CM | POA: Diagnosis present

## 2019-08-25 DIAGNOSIS — T82898A Other specified complication of vascular prosthetic devices, implants and grafts, initial encounter: Secondary | ICD-10-CM | POA: Diagnosis present

## 2019-08-25 DIAGNOSIS — M1612 Unilateral primary osteoarthritis, left hip: Secondary | ICD-10-CM | POA: Diagnosis present

## 2019-08-25 DIAGNOSIS — E282 Polycystic ovarian syndrome: Secondary | ICD-10-CM | POA: Diagnosis present

## 2019-08-25 DIAGNOSIS — R0782 Intercostal pain: Secondary | ICD-10-CM

## 2019-08-25 DIAGNOSIS — I472 Ventricular tachycardia: Secondary | ICD-10-CM | POA: Diagnosis not present

## 2019-08-25 DIAGNOSIS — K5900 Constipation, unspecified: Secondary | ICD-10-CM | POA: Diagnosis not present

## 2019-08-25 DIAGNOSIS — I9589 Other hypotension: Secondary | ICD-10-CM | POA: Diagnosis present

## 2019-08-25 DIAGNOSIS — E1122 Type 2 diabetes mellitus with diabetic chronic kidney disease: Secondary | ICD-10-CM | POA: Diagnosis present

## 2019-08-25 DIAGNOSIS — I953 Hypotension of hemodialysis: Secondary | ICD-10-CM | POA: Diagnosis present

## 2019-08-25 DIAGNOSIS — I309 Acute pericarditis, unspecified: Principal | ICD-10-CM | POA: Diagnosis present

## 2019-08-25 DIAGNOSIS — Z841 Family history of disorders of kidney and ureter: Secondary | ICD-10-CM

## 2019-08-25 DIAGNOSIS — I700 Atherosclerosis of aorta: Secondary | ICD-10-CM | POA: Diagnosis present

## 2019-08-25 DIAGNOSIS — Z751 Person awaiting admission to adequate facility elsewhere: Secondary | ICD-10-CM

## 2019-08-25 DIAGNOSIS — D631 Anemia in chronic kidney disease: Secondary | ICD-10-CM | POA: Diagnosis present

## 2019-08-25 DIAGNOSIS — Z96641 Presence of right artificial hip joint: Secondary | ICD-10-CM | POA: Diagnosis present

## 2019-08-25 DIAGNOSIS — I480 Paroxysmal atrial fibrillation: Secondary | ICD-10-CM | POA: Diagnosis not present

## 2019-08-25 DIAGNOSIS — I3139 Other pericardial effusion (noninflammatory): Secondary | ICD-10-CM

## 2019-08-25 DIAGNOSIS — F419 Anxiety disorder, unspecified: Secondary | ICD-10-CM | POA: Diagnosis present

## 2019-08-25 DIAGNOSIS — E669 Obesity, unspecified: Secondary | ICD-10-CM | POA: Diagnosis present

## 2019-08-25 DIAGNOSIS — E118 Type 2 diabetes mellitus with unspecified complications: Secondary | ICD-10-CM | POA: Diagnosis present

## 2019-08-25 DIAGNOSIS — Z90710 Acquired absence of both cervix and uterus: Secondary | ICD-10-CM

## 2019-08-25 DIAGNOSIS — Y712 Prosthetic and other implants, materials and accessory cardiovascular devices associated with adverse incidents: Secondary | ICD-10-CM | POA: Diagnosis present

## 2019-08-25 DIAGNOSIS — E785 Hyperlipidemia, unspecified: Secondary | ICD-10-CM | POA: Diagnosis present

## 2019-08-25 DIAGNOSIS — I314 Cardiac tamponade: Secondary | ICD-10-CM | POA: Diagnosis present

## 2019-08-25 DIAGNOSIS — G8918 Other acute postprocedural pain: Secondary | ICD-10-CM

## 2019-08-25 DIAGNOSIS — G4733 Obstructive sleep apnea (adult) (pediatric): Secondary | ICD-10-CM | POA: Diagnosis present

## 2019-08-25 DIAGNOSIS — Z7982 Long term (current) use of aspirin: Secondary | ICD-10-CM

## 2019-08-25 DIAGNOSIS — I2510 Atherosclerotic heart disease of native coronary artery without angina pectoris: Secondary | ICD-10-CM | POA: Diagnosis present

## 2019-08-25 DIAGNOSIS — R0602 Shortness of breath: Secondary | ICD-10-CM

## 2019-08-25 DIAGNOSIS — R072 Precordial pain: Secondary | ICD-10-CM

## 2019-08-25 DIAGNOSIS — Z86718 Personal history of other venous thrombosis and embolism: Secondary | ICD-10-CM

## 2019-08-25 DIAGNOSIS — D72829 Elevated white blood cell count, unspecified: Secondary | ICD-10-CM

## 2019-08-25 DIAGNOSIS — I313 Pericardial effusion (noninflammatory): Secondary | ICD-10-CM

## 2019-08-25 DIAGNOSIS — R000 Tachycardia, unspecified: Secondary | ICD-10-CM | POA: Diagnosis present

## 2019-08-25 DIAGNOSIS — Z6837 Body mass index (BMI) 37.0-37.9, adult: Secondary | ICD-10-CM

## 2019-08-25 DIAGNOSIS — Z8249 Family history of ischemic heart disease and other diseases of the circulatory system: Secondary | ICD-10-CM

## 2019-08-25 DIAGNOSIS — I319 Disease of pericardium, unspecified: Secondary | ICD-10-CM | POA: Diagnosis present

## 2019-08-25 DIAGNOSIS — D638 Anemia in other chronic diseases classified elsewhere: Secondary | ICD-10-CM

## 2019-08-25 DIAGNOSIS — R079 Chest pain, unspecified: Secondary | ICD-10-CM | POA: Diagnosis present

## 2019-08-25 LAB — CBC
HCT: 43.5 % (ref 36.0–46.0)
Hemoglobin: 14 g/dL (ref 12.0–15.0)
MCH: 32.7 pg (ref 26.0–34.0)
MCHC: 32.2 g/dL (ref 30.0–36.0)
MCV: 101.6 fL — ABNORMAL HIGH (ref 80.0–100.0)
Platelets: 146 10*3/uL — ABNORMAL LOW (ref 150–400)
RBC: 4.28 MIL/uL (ref 3.87–5.11)
RDW: 16.1 % — ABNORMAL HIGH (ref 11.5–15.5)
WBC: 9.9 10*3/uL (ref 4.0–10.5)
nRBC: 0 % (ref 0.0–0.2)

## 2019-08-25 LAB — BASIC METABOLIC PANEL
Anion gap: 17 — ABNORMAL HIGH (ref 5–15)
BUN: 24 mg/dL — ABNORMAL HIGH (ref 8–23)
CO2: 27 mmol/L (ref 22–32)
Calcium: 8.1 mg/dL — ABNORMAL LOW (ref 8.9–10.3)
Chloride: 91 mmol/L — ABNORMAL LOW (ref 98–111)
Creatinine, Ser: 6.58 mg/dL — ABNORMAL HIGH (ref 0.44–1.00)
GFR calc Af Amer: 7 mL/min — ABNORMAL LOW (ref >60)
GFR calc non Af Amer: 6 mL/min — ABNORMAL LOW (ref >60)
Glucose, Bld: 80 mg/dL (ref 70–99)
Potassium: 4.6 mmol/L (ref 3.5–5.1)
Sodium: 135 mmol/L (ref 135–145)

## 2019-08-25 LAB — TROPONIN I (HIGH SENSITIVITY)
Troponin I (High Sensitivity): 12 ng/L (ref <18)
Troponin I (High Sensitivity): 10 ng/L (ref <18)

## 2019-08-25 LAB — GLUCOSE, CAPILLARY: Glucose-Capillary: 94 mg/dL (ref 70–99)

## 2019-08-25 LAB — SARS CORONAVIRUS 2 (TAT 6-24 HRS)
SARS Coronavirus 2: NEGATIVE
SARS Coronavirus 2: NEGATIVE

## 2019-08-25 LAB — LACTIC ACID, PLASMA: Lactic Acid, Venous: 1.6 mmol/L (ref 0.5–1.9)

## 2019-08-25 MED ORDER — SUCROFERRIC OXYHYDROXIDE 500 MG PO CHEW
1000.0000 mg | CHEWABLE_TABLET | Freq: Three times a day (TID) | ORAL | Status: DC
  Administered 2019-08-25 – 2019-09-09 (×46): 1000 mg via ORAL
  Filled 2019-08-25 (×66): qty 2

## 2019-08-25 MED ORDER — APIXABAN 5 MG PO TABS
5.0000 mg | ORAL_TABLET | Freq: Two times a day (BID) | ORAL | Status: DC
  Administered 2019-08-25: 5 mg via ORAL
  Filled 2019-08-25: qty 1

## 2019-08-25 MED ORDER — ASPIRIN EC 81 MG PO TBEC
81.0000 mg | DELAYED_RELEASE_TABLET | Freq: Two times a day (BID) | ORAL | Status: DC
  Administered 2019-08-25 (×2): 81 mg via ORAL
  Filled 2019-08-25 (×3): qty 1

## 2019-08-25 MED ORDER — SODIUM CHLORIDE 0.9 % IV BOLUS
500.0000 mL | Freq: Once | INTRAVENOUS | Status: AC
  Administered 2019-08-25: 500 mL via INTRAVENOUS

## 2019-08-25 MED ORDER — IBUPROFEN 600 MG PO TABS
600.0000 mg | ORAL_TABLET | Freq: Three times a day (TID) | ORAL | Status: DC
  Administered 2019-08-25 (×2): 600 mg via ORAL
  Filled 2019-08-25 (×3): qty 1

## 2019-08-25 MED ORDER — SODIUM CHLORIDE 0.9 % IV BOLUS
250.0000 mL | Freq: Once | INTRAVENOUS | Status: AC
  Administered 2019-08-25: 250 mL via INTRAVENOUS

## 2019-08-25 MED ORDER — PANTOPRAZOLE SODIUM 40 MG IV SOLR
40.0000 mg | Freq: Once | INTRAVENOUS | Status: AC
  Administered 2019-08-25: 01:00:00 40 mg via INTRAVENOUS
  Filled 2019-08-25: qty 40

## 2019-08-25 MED ORDER — GABAPENTIN 100 MG PO CAPS
100.0000 mg | ORAL_CAPSULE | Freq: Every day | ORAL | Status: DC
  Administered 2019-08-25 – 2019-09-08 (×15): 100 mg via ORAL
  Filled 2019-08-25 (×15): qty 1

## 2019-08-25 MED ORDER — FENTANYL CITRATE (PF) 100 MCG/2ML IJ SOLN
25.0000 ug | Freq: Once | INTRAMUSCULAR | Status: DC
  Filled 2019-08-25: qty 2

## 2019-08-25 MED ORDER — SODIUM CHLORIDE 0.9 % IV BOLUS: 500.0000 mL | Freq: Once | INTRAVENOUS | Status: DC

## 2019-08-25 MED ORDER — ASPIRIN 81 MG PO CHEW
81.0000 mg | CHEWABLE_TABLET | Freq: Once | ORAL | Status: AC
  Administered 2019-08-25: 01:00:00 81 mg via ORAL
  Filled 2019-08-25: qty 1

## 2019-08-25 MED ORDER — ATORVASTATIN CALCIUM 40 MG PO TABS
40.0000 mg | ORAL_TABLET | Freq: Every day | ORAL | Status: DC
  Administered 2019-08-28 – 2019-09-03 (×4): 40 mg via ORAL
  Filled 2019-08-25 (×9): qty 1

## 2019-08-25 MED ORDER — ACETAMINOPHEN 650 MG RE SUPP: 650.0000 mg | Freq: Four times a day (QID) | RECTAL | Status: DC | PRN

## 2019-08-25 MED ORDER — ACETAMINOPHEN 325 MG PO TABS
650.0000 mg | ORAL_TABLET | Freq: Four times a day (QID) | ORAL | Status: DC | PRN
  Administered 2019-08-29 – 2019-09-03 (×6): 650 mg via ORAL
  Filled 2019-08-25 (×6): qty 2

## 2019-08-25 MED ORDER — MIDODRINE HCL 5 MG PO TABS
2.5000 mg | ORAL_TABLET | Freq: Three times a day (TID) | ORAL | Status: DC
  Administered 2019-08-25 – 2019-08-27 (×7): 2.5 mg via ORAL
  Filled 2019-08-25 (×9): qty 1

## 2019-08-25 NOTE — H&P (Addendum)
History and Physical:    Brenda Arnold   [id number] DOB: 01/12/1956 DOA: 08/25/2019  Referring MD/provider: Dr Jeanell Sparrow PCP: Kelton Pillar, MD   Patient coming from: Home  Chief Complaint: chest pain  History of Present Illness:   Brenda Arnold is an 63 y.o. female with past medical history si for ESRD on HD, PAF, DM 2 and persistent hypotension who presents for the second time in 24 hours for evaluation of chest pain.  Patient states that she was in her usual state of health until 2 days ago when she developed substernal chest pain after eating a chicken sandwich.  She initially thought this was secondary to reflux.  She subsequently noted that she had recurrent intermittent chest pain every time she took a deep breath and arm she moved her torso.  What concerned her however is that she seems to have lost her appetite and was feeling more weak and tired than usual.  The chest pain is intermittently sharp and moves from upper right chest to lower left chest as well as the substernal area.  She notes that if she lays still the chest pain does not occur.  However if she takes a deep breath in or moves her torso the chest pain definitely comes back.  There is no associated shortness of breath nausea or vomiting.  She does have intermittent dizziness which she states is usual for her given her chronic low blood pressure.  Of note she was admitted December 2019 for similar complaints during which time she had RVR with her PAF.  Work-up at that time revealed three-vessel coronary calcification via CT scan.  Follow-up with cardiology at that time noted that she was asymptomatic except for when she was RVR so no further cardiac work-up was recommended at that time.  ED Course: Patient was seen in the ED earlier today for evaluation of the chest pain.  She underwent a complete work-up including a CT angiogram of chest abdomen pelvis to rule out dissection and it was negative.  Her EKG was  noted to have ST elevation versus J-point elevation in 1 however given a chest troponins which were negative she was discharged home early this morning.  Patient went to hemodialysis and after 2 hours of dialysis patient again developed intermittent dizziness and chest pain and was sent back to the ED.   In the ED this time patient was noted to be persistently hypotensive with blood pressure 80s over 40s with intermittent chest pain.  EKG showed persistent ST elevation versus J-point elevation in 1 without change.  Repeat troponins are still negative.  Given ongoing chest pain patient is now admitted for further cardiac work-up.  ROS:   ROS   Review of Systems: General: No fever, chills, weight changes Respiratory: No cough, shortness of breath, hemoptysis GI: No nausea, vomiting, diarrhea, constipation GU: No dysuria, increased frequency CNS: No numbness, dizziness, headache Musculoskeletal: No back pain, joint pain Blood/lymphatics: No easy bruising, bleeding Mood/affect: No anxiety/depression    Past Medical History:   Past Medical History:  Diagnosis Date  . Anemia   . Arthritis   . Asthma   . Complication of anesthesia    difficulty with getting oxygen saturation up  . Diabetes mellitus   . Dyslipidemia   . Epistaxis 11/05/2012  . ESRD (end stage renal disease) on dialysis [facility])    "MWF; [facility]" (09/15/2018)  . History of nuclear stress test    Myoview 5/18: EF 68, no  infarct or ischemia, low risk  . HTN (hypertension)   . Hyperlipidemia   . Hyperparathyroidism due to renal insufficiency ([place])   . Obesity    s/p panniculectomy  . Paroxysmal A-fib (HCC)    a. chronic coumadin;  b. 12/2009 Echo: EF 60-65%, Gr 1 DD.  Marland Kitchen PCOS (polycystic ovarian syndrome)   . Pneumonia   . Seasonal allergies   . Sleep apnea    a. not using CPAP, last study  >8 yrs  . Vitamin D deficiency     Past Surgical History:   Past Surgical History:  Procedure Laterality Date  .  ABDOMINAL HYSTERECTOMY     with panniculctomy  . AV FISTULA PLACEMENT  09/02/2012   Procedure: ARTERIOVENOUS (AV) FISTULA CREATION;  Surgeon: Elam Dutch, MD;  Location: [facility];  Service: Vascular;  Laterality: Left;  Creation of Left Radial-Cephalic Fistula   . BREAST SURGERY     Biopsy right breast  . COLONOSCOPY W/ BIOPSIES AND POLYPECTOMY    . DILATION AND CURETTAGE OF UTERUS    . KNEE ARTHROSCOPY Right   . REVISON OF ARTERIOVENOUS FISTULA Left 04/27/2014   Procedure: REVISON OF LEFT RADIAL-CEPHALIC ARTERIOVENOUS FISTULA;  Surgeon: Mal Misty, MD;  Location: [facility];  Service: Vascular;  Laterality: Left;  . TOTAL HIP ARTHROPLASTY Right 09/15/2018  . TOTAL HIP ARTHROPLASTY Right 09/15/2018   Procedure: RIGHT TOTAL HIP ARTHROPLASTY ANTERIOR APPROACH;  Surgeon: Leandrew Koyanagi, MD;  Location: [facility];  Service: Orthopedics;  Laterality: Right;  . UVULOPLASTY      Social History:   Social History   Socioeconomic History  . Marital status: Single    Spouse name: Not on file  . Number of children: Not on file  . Years of education: Not on file  . Highest education level: Not on file  Occupational History  . Not on file  Social Needs  . Financial resource strain: Not on file  . Food insecurity    Worry: Not on file    Inability: Not on file  . Transportation needs    Medical: Not on file    Non-medical: Not on file  Tobacco Use  . Smoking status: Never Smoker  . Smokeless tobacco: Never Used  Substance and Sexual Activity  . Alcohol use: No    Alcohol/week: 0.0 standard drinks  . Drug use: No  . Sexual activity: Not on file  Lifestyle  . Physical activity    Days per week: Not on file    Minutes per session: Not on file  . Stress: Not on file  Relationships  . Social Herbalist on phone: Not on file    Gets together: Not on file    Attends religious service: Not on file    Active member of club or organization: Not on file    Attends meetings of clubs  or organizations: Not on file    Relationship status: Not on file  . Intimate partner violence    Fear of current or ex partner: Not on file    Emotionally abused: Not on file    Physically abused: Not on file    Forced sexual activity: Not on file  Other Topics Concern  . Not on file  Social History Narrative   Lives in [place] alone. 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    Allergies   Iodine, Shellfish allergy, and Norvasc [amlodipine]  Family history:   Family History  Problem Relation Age  of Onset  . Lung cancer Father        died @ 91  . Hypertension Mother        alive @ 55  . Diabetes Brother   . Kidney disease Brother     Current Medications:   Prior to Admission medications   Medication Sig Start Date End Date Taking? Authorizing Provider  aspirin EC 81 MG tablet Take 1 tablet (81 mg total) by mouth 2 (two) times daily. Patient not taking: Reported on 08/25/2019 09/15/18   Leandrew Koyanagi, MD  atorvastatin (LIPITOR) 40 MG tablet Take 1 tablet (40 mg total) by mouth daily at 6 PM. Patient not taking: Reported on 08/25/2019 11/09/18   Georgette Shell, MD  bisacodyl (DULCOLAX) 10 MG suppository Place 10 mg rectally as needed for moderate constipation.    [provider]  calcitRIOL (ROCALTROL) 0.5 MCG capsule Take 3 capsules (1.5 mcg total) by mouth 3 (three) times a week. Patient not taking: Reported on 08/25/2019 11/11/18   Georgette Shell, MD  ELIQUIS 5 MG TABS tablet Take 5 mg by mouth 2 (two) times daily.  12/19/18   [provider]  ethyl chloride spray Apply 1 application topically See admin instructions. For dialysis 06/25/18   [provider]  gabapentin (NEURONTIN) 100 MG capsule Take 100 mg by mouth at bedtime. 05/14/17   [provider]  methocarbamol (ROBAXIN) 500 MG tablet Take 1 tablet (500 mg total) by mouth every 6 (six) hours as needed for muscle spasms. Patient not taking: Reported on  08/25/2019 09/15/18   Leandrew Koyanagi, MD  ondansetron (ZOFRAN) 4 MG tablet Take 1-2 tablets (4-8 mg total) by mouth every 8 (eight) hours as needed for nausea or vomiting. Patient not taking: Reported on 08/25/2019 09/15/18   Leandrew Koyanagi, MD  oxyCODONE (OXY IR/ROXICODONE) 5 MG immediate release tablet Take 1-3 tablets (5-15 mg total) by mouth every 4 (four) hours as needed. Patient not taking: Reported on 08/25/2019 09/15/18   Leandrew Koyanagi, MD  polycarbophil (FIBERCON) 625 MG tablet Take 625 mg by mouth daily.    [provider]  promethazine (PHENERGAN) 25 MG tablet Take 1 tablet (25 mg total) by mouth every 6 (six) hours as needed for nausea. Patient not taking: Reported on 08/25/2019 09/15/18   Leandrew Koyanagi, MD  senna-docusate (SENOKOT S) 8.6-50 MG tablet Take 1 tablet by mouth at bedtime as needed. Patient not taking: Reported on 08/25/2019 09/15/18   Leandrew Koyanagi, MD  sucroferric oxyhydroxide (VELPHORO) 500 MG chewable tablet Chew 2 capsules by mouth 4 (four) times daily -  with meals and at bedtime.     [provider]  warfarin (COUMADIN) 7.5 MG tablet Take 1 tablet (7.5 mg total) by mouth daily. Patient not taking: Reported on 08/25/2019 11/09/18 11/09/19  Georgette Shell, MD    Physical Exam:   Vitals:   08/25/19 1115 08/25/19 1200 08/25/19 1230 08/25/19 1245  BP: (!) 82/24 (!) 85/41  (!) 85/50  Pulse:    85  Resp: (!) 24 (!) 21  (!) 21  Temp:      TempSrc:      SpO2:   100% 100%     Physical Exam: Blood pressure (!) 85/50, pulse 85, temperature 99 F (37.2 C), temperature source Oral, resp. rate (!) 21, SpO2 100 %. Gen: Tired appearing,  obese female lying in bed with her eyes closed. Eyes: Sclerae anicteric. Conjunctiva mildly injected. Chest: Moderately good air  entry bilaterally with rales at bases improved with deep breathing.  Pain was reproduced with deep breathing.   CV: I was able to exactly reproduce patient's chest pain with palpation along  patient's lower sternum as well as along patient's ribs and in her xiphoid process.  Heart sounds are irregularly irregular, 2/6 systolic murmur left sternal border.  Abdomen: NABS, soft, nondistended, nontender.  No epigastric tenderness.  No right upper quadrant tenderness.  No tenderness to light or deep palpation. No rebound, no guarding. Extremities: No edema.  Skin: Warm and dry. No rashes, lesions or wounds. Neuro: Alert and oriented times 3; grossly nonfocal. Psych: Patient is cooperative, logical and coherent with appropriate mood and affect.  Data Review:    Labs: Basic Metabolic Panel: Recent Labs  Lab 08/24/19 1747 08/24/19 1802 08/25/19 0931  NA 137 137 135  K 4.6 4.4 4.6  CL 93* 95* 91*  CO2 29  --  27  GLUCOSE 93 86 80  BUN 40* 42* 24*  CREATININE 9.73* 9.90* 6.58*  CALCIUM 8.0*  --  8.1*   Liver Function Tests: No results for input(s): AST, ALT, ALKPHOS, BILITOT, PROT, ALBUMIN in the last 168 hours. No results for input(s): LIPASE, AMYLASE in the last 168 hours. No results for input(s): AMMONIA in the last 168 hours. CBC: Recent Labs  Lab 08/24/19 1747 08/24/19 1802 08/25/19 0931  WBC 8.9  --  9.9  NEUTROABS 6.5  --   --   HGB 13.1 14.6 14.0  HCT 42.3 43.0 43.5  MCV 103.4*  --  101.6*  PLT 147*  --  146*   Cardiac Enzymes: No results for input(s): CKTOTAL, CKMB, CKMBINDEX, TROPONINI in the last 168 hours.  BNP (last 3 results) No results for input(s): PROBNP in the last 8760 hours. CBG: No results for input(s): GLUCAP in the last 168 hours.  Urinalysis    Component Value Date/Time   COLORURINE YELLOW 11/05/2012 2354   APPEARANCEUR CLEAR 11/05/2012 2354   LABSPEC 1.016 11/05/2012 2354   PHURINE 5.5 11/05/2012 2354   GLUCOSEU NEGATIVE 11/05/2012 2354   HGBUR SMALL (A) 11/05/2012 2354   BILIRUBINUR NEGATIVE 11/05/2012 2354   KETONESUR NEGATIVE 11/05/2012 2354   PROTEINUR 100 (A) 11/05/2012 2354   UROBILINOGEN 0.2 11/05/2012 2354   NITRITE  NEGATIVE 11/05/2012 2354   LEUKOCYTESUR NEGATIVE 11/05/2012 2354      Radiographic Studies: Dg Chest Port 1 View  Result Date: 08/25/2019 CLINICAL DATA:  63 year old female with history of chest pain and palpitations. EXAM: PORTABLE CHEST 1 VIEW COMPARISON:  Chest x-ray 08/24/2019. FINDINGS: Lung volumes are low. Bibasilar opacities which may reflect areas of atelectasis and/or consolidation. Small bilateral pleural effusions. No evidence of pulmonary edema. Heart size is normal. Upper mediastinal contours are within normal limits. Aortic atherosclerosis. IMPRESSION: 1. Low lung volumes with bibasilar areas of atelectasis and/or consolidation and small bilateral pleural effusions. Electronically Signed   By: Vinnie Langton M.D.   On: 08/25/2019 10:19   Dg Chest Port 1 View  Result Date: 08/24/2019 CLINICAL DATA:  Pt has generalized CP, no radiation since yesterday. Pt reports it hurts to lay back or take a deep breath. Dialysis patient EXAM: PORTABLE CHEST - 1 VIEW COMPARISON:  11/03/2018 FINDINGS: Coarse bilateral infrahilar and bibasilar pulmonary opacities. No overt edema. Heart size normal.  Aortic Atherosclerosis (ICD10-170.0). No definite effusion. No pneumothorax. Visualized bones unremarkable. IMPRESSION: Coarse chronic-appearing bilateral infrahilar and basilar opacities. Electronically Signed   By: Lucrezia Europe M.D.   On: 08/24/2019  18:46   Ct Angio Chest/abd/pel For Dissection W And/or Wo Contrast  Result Date: 08/24/2019 CLINICAL DATA:  63 year old female with chest pain. Concern for aortic dissection. EXAM: CT ANGIOGRAPHY CHEST, ABDOMEN AND PELVIS TECHNIQUE: Multidetector CT imaging through the chest, abdomen and pelvis was performed using the standard protocol during bolus administration of intravenous contrast. Multiplanar reconstructed images and MIPs were obtained and reviewed to evaluate the vascular anatomy. CONTRAST:  155mL OMNIPAQUE IOHEXOL 350 MG/ML SOLN COMPARISON:  CT  angiography of the chest dated 11/04/2018. FINDINGS: CTA CHEST FINDINGS Cardiovascular: There is mild cardiomegaly. No pericardial effusion. Advanced multi vessel coronary vascular calcification noted. There is mild atherosclerotic calcification of the thoracic aorta. No aneurysmal dilatation or dissection. The central pulmonary arteries appear patent as visualized. Mediastinum/Nodes: No hilar or mediastinal adenopathy. The esophagus is grossly unremarkable. Small bilateral thyroid nodules noted. Bilateral posterior exophytic thyroid nodules measure up to 11 mm on the left which may represent exophytic thyroid nodules versus parathyroid adenoma. Ultrasound may provide better evaluation on a nonemergent basis. No mediastinal fluid collection. Lungs/Pleura: There are bibasilar linear and streaky densities most consistent with atelectatic changes. Developing infiltrate is less likely. Clinical correlation is recommended. There is a subcentimeter left upper lobe calcified granuloma. There is no pleural effusion pneumothorax. The central airways are patent. Musculoskeletal: Degenerative changes of the spine and osteophyte. No acute osseous pathology. Review of the MIP images confirms the above findings. CTA ABDOMEN AND PELVIS FINDINGS VASCULAR Aorta: Mild atherosclerotic calcification. No dissection or aneurysm. Celiac: Patent without evidence of aneurysm, dissection, vasculitis or significant stenosis. SMA: Patent without evidence of aneurysm, dissection, vasculitis or significant stenosis. Renals: There is advanced atherosclerotic calcification of the origins of the renal arteries with decreased perfusion in the kidneys. IMA: Patent without evidence of aneurysm, dissection, vasculitis or significant stenosis. Inflow: Mild atherosclerotic calcification. No aneurysm or dissection. Veins: No obvious venous abnormality within the limitations of this arterial phase study. Review of the MIP images confirms the above  findings. NON-VASCULAR No intra-abdominal free air or free fluid. Hepatobiliary: Slight irregularity of the liver contour may represent early changes of cirrhosis. Clinical correlation is recommended. No intrahepatic biliary ductal dilatation. The gallbladder is unremarkable. Pancreas: Unremarkable. No pancreatic ductal dilatation or surrounding inflammatory changes. Spleen: Normal in size without focal abnormality. Adrenals/Urinary Tract: Left adrenal thickening/hyperplasia. The right adrenal gland is suboptimally visualized. Moderate to severe bilateral renal parenchyma atrophy, likely related to chronic kidney disease. No hydronephrosis or nephrolithiasis. Low attenuating bilateral renal lesions measure up to 6 cm on the upper pole of the right kidney. The larger lesions demonstrate fluid attenuation most consistent with cysts and the smaller lesions are suboptimally characterized. The visualized ureters appear unremarkable. The urinary bladder is collapsed. Stomach/Bowel: There is no bowel obstruction or active inflammation. The appendix is normal. Lymphatic: No adenopathy. Reproductive: Hysterectomy. Other: None Musculoskeletal: Degenerative changes of the spine. Severe arthritic changes of the left hip. Right hip arthroplasty. No acute osseous pathology. Review of the MIP images confirms the above findings. IMPRESSION: 1. No acute intrathoracic, abdominal, or pelvic pathology. No CT evidence of aortic dissection or aneurysm. 2. Advanced coronary vascular calcification and mild cardiomegaly. 3. Bibasilar atelectatic changes versus less likely infiltrate. Clinical correlation is recommended. 4. Bilateral thyroid nodules. Ultrasound may provide better evaluation on a nonemergent basis. 5. Moderate to severe bilateral renal parenchyma atrophy, likely related to chronic kidney disease. 6. No bowel obstruction or active inflammation. Normal appendix. 7. Cirrhosis. Aortic Atherosclerosis (ICD10-I70.0). Electronically  Signed   By: Milas Hock  Radparvar M.D.   On: 08/24/2019 23:56    EKG: Independently reviewed.  Patient appears to be in trigeminy versus wandering atrial pacemaker.  She does have some elevation of her ST in 1 although the morphology of the ST is preserved so I favor this is more likely J-point elevation which is newly observed.  She has Q waves in 2, 3 and F as well as V1 through V3.  She is got poor R wave progression.  Q waves and poor R wave progression are also seen on EKG from December 2019.   Assessment/Plan:   Principal Problem:   Chest pain Active Problems:   Type 2 diabetes mellitus with complication, without long-term current use of insulin (HCC)   OBESITY-MORBID (>100')   OSA (obstructive sleep apnea)   Hypotension   ESRD on hemodialysis (HCC)   A-fib ([place])  63 year old female with multiple risk factors for CAD as well as three-vessel calcification on cardiac CT presents with atypical chest pain which is reproducible via palpation on sternum, normal troponins, likely new J-point elevation in 1 on EKG and persistent hypotension.  ATYPICAL CHEST PAIN  Patient with known CAD presents with atypical reproducible chest pain and normal troponins. I believe a study showed a small minority of people with MIs present with reproducible chest pain. However I suspect patient has arthritis in her sternal area as likely cause of her chest pain. Also of note patient has a history of DVT although PE was ruled out by CTA yesterday. At this point will await further input from cardiology. Continue aspirin 81 twice daily per home doses. No beta-blocker due to persistent hypotension at present.  HYPOTENSION Patient with persistent hypotension here in the ED although she states that she is chronically hypotensive and is on midodrine at home 3 times a day.  Of note midodrine does not show up on her medicine reconciliation. I also note that she is somewhat hemoconcentrated compared to previous. She  also notes that she has not been eating well for the past 2 days.  She only ate Jell-O yesterday. Patient received 500 cc normal saline in the ED, I will repeat that x1 for total of thousand cc fluids. I am also giving her midodrine 2.5 3 times daily even though it is not on her med rec and this can be uptitrated as warranted.  UPDATE: ED nurses noted that blood pressure as taken on the calf was much higher and within normal limits while blood pressure on right upper extremity was extremely low.  Clinically, patient did not seem to be as hypotensive as was being measured by blood pressures taken on the right upper extremity.  When her pressures were reading 60/30, patient was awake alert and seemed to have plenty of energy.  Denied feeling tired or dizzy or weak.  Radial pulse on the right upper extremity was barely palpable.  I suspect she likely has significant vascular disease of her right direct upper extremity which makes blood pressure measurements there unreliable.  Would recommend continue to take blood pressure and lower extremities.  Left upper extremity has her fistula.  DM2 Carb controlled diet.  PAF Continue Eliquis per home doses And on any rate control medications due to persistent hypotension.  ESRD ON HD Patient is followed closely by renal, will need to watch fluid status given 1 L normal saline given to her today. She did undergo 2 and half hours of dialysis earlier today.   Other information:   DVT prophylaxis: On Eliquis  Code Status: Full code. Family Communication: Patient states her family is aware that she is here Disposition Plan: Home Consults called: Cardiology, by the ED Admission status: Observation  The medical decision making on this patient was of high complexity and the patient is at high risk for clinical deterioration, therefore this is a level 3 visit.   Dewaine Oats Tublu Brenda Arnold [facility]  If 7PM-7AM, please contact night-coverage  [email or web address] Password [email or web address] 08/25/2019, 1:24 PM

## 2019-08-25 NOTE — ED Notes (Signed)
Pt to be transported to dialysis by ED security

## 2019-08-25 NOTE — ED Provider Notes (Signed)
[facility] EMERGENCY DEPARTMENT Provider Note   CSN: [id number] Arrival date & time: 08/25/19  [id number]     History   Chief Complaint Chief Complaint  Patient presents with  . Chest Pain    HPI Brenda Arnold is a 63 y.o. female.     HPI  63 year old female presents today complaining of chest pain palpitations.  Patient is end-stage renal dialysis patient and was on dialysis when her symptoms worsen.  She states she was here yesterday for the same thing.  Review of records from yesterday report that she had chest pain that began yesterday.  She was seen and evaluated in ED with EKG, serial troponins, and CT of the chest abdomen and pelvis.  She has some hypertension which has been ongoing was admitted on here in the ED for the same.  She is not discharged to her dialysis.  She states that during dialysis she had ongoing palpitations and was mildly hypotensive and I sent her back here without completing her dialysis.  EKG done upon my evaluation shows minimal ST elevation in 1 to and aVF.  This looks similar to EKG from last night but does have some changes from the  Past Medical History:  Diagnosis Date  . Anemia   . Arthritis   . Asthma   . Complication of anesthesia    difficulty with getting oxygen saturation up  . Diabetes mellitus   . Dyslipidemia   . Epistaxis 11/05/2012  . ESRD (end stage renal disease) on dialysis [facility])    "MWF; Jeneen Rinks" (09/15/2018)  . History of nuclear stress test    Myoview 5/18: EF 68, no infarct or ischemia, low risk  . HTN (hypertension)   . Hyperlipidemia   . Hyperparathyroidism due to renal insufficiency ([place])   . Obesity    s/p panniculectomy  . Paroxysmal A-fib (HCC)    a. chronic coumadin;  b. 12/2009 Echo: EF 60-65%, Gr 1 DD.  Marland Kitchen PCOS (polycystic ovarian syndrome)   . Pneumonia   . Seasonal allergies   . Sleep apnea    a. not using CPAP, last study  >8 yrs  . Vitamin D deficiency     Patient Active Problem  List   Diagnosis Date Noted  . CAD (coronary artery disease) 11/05/2018  . A-fib ([place]) 11/05/2018  . Atrial fibrillation with RVR ([place])   . Hypotension 11/04/2018  . ESRD on hemodialysis ([place]) 11/04/2018  . DVT (deep venous thrombosis) ([place]) 11/03/2018  . Primary osteoarthritis of right hip 09/15/2018  . Status post total replacement of right hip 09/15/2018  . Varicose veins of bilateral lower extremities with other complications 0000000  . Anticoagulation goal of INR 2 to 3 09/30/2014  . Pain in lower limb 05/02/2014  . Onychomycosis due to dermatophyte 04/06/2013  . Pain in joint, ankle and foot 04/06/2013  . Bradycardia 11/06/2012  . Left-sided epistaxis 11/05/2012  . DM (diabetes mellitus) ([place]) 11/05/2012  . OSA (obstructive sleep apnea) 11/05/2012  . Acute posterior epistaxis 11/05/2012  . CKD (chronic kidney disease) 11/05/2012  . End stage renal disease ([place]) 06/25/2012  . Type 2 diabetes mellitus with complication, without long-term current use of insulin ([place]) 12/20/2009  . OBESITY 12/20/2009  . OBESITY-MORBID (>100') 12/20/2009  . Essential hypertension 12/20/2009  . Paroxysmal atrial fibrillation ([place]) 12/20/2009  . Chest pain 12/20/2009  . ABNORMAL CV (STRESS) TEST 12/20/2009    Past Surgical History:  Procedure Laterality Date  . ABDOMINAL HYSTERECTOMY  with panniculctomy  . AV FISTULA PLACEMENT  09/02/2012   Procedure: ARTERIOVENOUS (AV) FISTULA CREATION;  Surgeon: Elam Dutch, MD;  Location: [facility] OR;  Service: Vascular;  Laterality: Left;  Creation of Left Radial-Cephalic Fistula   . BREAST SURGERY     Biopsy right breast  . COLONOSCOPY W/ BIOPSIES AND POLYPECTOMY    . DILATION AND CURETTAGE OF UTERUS    . KNEE ARTHROSCOPY Right   . REVISON OF ARTERIOVENOUS FISTULA Left 04/27/2014   Procedure: REVISON OF LEFT RADIAL-CEPHALIC ARTERIOVENOUS FISTULA;  Surgeon: Mal Misty, MD;  Location: [facility];  Service: Vascular;  Laterality: Left;  . TOTAL HIP  ARTHROPLASTY Right 09/15/2018  . TOTAL HIP ARTHROPLASTY Right 09/15/2018   Procedure: RIGHT TOTAL HIP ARTHROPLASTY ANTERIOR APPROACH;  Surgeon: Leandrew Koyanagi, MD;  Location: [facility];  Service: Orthopedics;  Laterality: Right;  . UVULOPLASTY       OB History   No obstetric history on file.      Home Medications    Prior to Admission medications   Medication Sig Start Date End Date Taking? Authorizing Provider  aspirin EC 81 MG tablet Take 1 tablet (81 mg total) by mouth 2 (two) times daily. Patient not taking: Reported on 08/25/2019 09/15/18   Leandrew Koyanagi, MD  atorvastatin (LIPITOR) 40 MG tablet Take 1 tablet (40 mg total) by mouth daily at 6 PM. Patient not taking: Reported on 08/25/2019 11/09/18   Georgette Shell, MD  bisacodyl (DULCOLAX) 10 MG suppository Place 10 mg rectally as needed for moderate constipation.    [provider]  calcitRIOL (ROCALTROL) 0.5 MCG capsule Take 3 capsules (1.5 mcg total) by mouth 3 (three) times a week. Patient not taking: Reported on 08/25/2019 11/11/18   Georgette Shell, MD  ELIQUIS 5 MG TABS tablet Take 5 mg by mouth 2 (two) times daily.  12/19/18   [provider]  ethyl chloride spray Apply 1 application topically See admin instructions. For dialysis 06/25/18   [provider]  gabapentin (NEURONTIN) 100 MG capsule Take 100 mg by mouth at bedtime. 05/14/17   [provider]  methocarbamol (ROBAXIN) 500 MG tablet Take 1 tablet (500 mg total) by mouth every 6 (six) hours as needed for muscle spasms. Patient not taking: Reported on 08/25/2019 09/15/18   Leandrew Koyanagi, MD  ondansetron (ZOFRAN) 4 MG tablet Take 1-2 tablets (4-8 mg total) by mouth every 8 (eight) hours as needed for nausea or vomiting. Patient not taking: Reported on 08/25/2019 09/15/18   Leandrew Koyanagi, MD  oxyCODONE (OXY IR/ROXICODONE) 5 MG immediate release tablet Take 1-3 tablets (5-15 mg total) by mouth every 4 (four) hours as needed. Patient not  taking: Reported on 08/25/2019 09/15/18   Leandrew Koyanagi, MD  polycarbophil (FIBERCON) 625 MG tablet Take 625 mg by mouth daily.    [provider]  promethazine (PHENERGAN) 25 MG tablet Take 1 tablet (25 mg total) by mouth every 6 (six) hours as needed for nausea. Patient not taking: Reported on 08/25/2019 09/15/18   Leandrew Koyanagi, MD  senna-docusate (SENOKOT S) 8.6-50 MG tablet Take 1 tablet by mouth at bedtime as needed. Patient not taking: Reported on 08/25/2019 09/15/18   Leandrew Koyanagi, MD  sucroferric oxyhydroxide (VELPHORO) 500 MG chewable tablet Chew 2 capsules by mouth 4 (four) times daily -  with meals and at bedtime.     [provider]  warfarin (COUMADIN) 7.5 MG tablet Take 1 tablet (7.5 mg total)  by mouth daily. Patient not taking: Reported on 08/25/2019 11/09/18 11/09/19  Georgette Shell, MD    Family History Family History  Problem Relation Age of Onset  . Lung cancer Father        died @ 20  . Hypertension Mother        alive @ 41  . Diabetes Brother   . Kidney disease Brother     Social History Social History   Tobacco Use  . Smoking status: Never Smoker  . Smokeless tobacco: Never Used  Substance Use Topics  . Alcohol use: No    Alcohol/week: 0.0 standard drinks  . Drug use: No     Allergies   Iodine, Shellfish allergy, and Norvasc [amlodipine]   Review of Systems Review of Systems   Physical Exam Updated Vital Signs BP (!) 84/53 (BP Location: Right Arm)   Pulse 100   Temp 99 F (37.2 C) (Oral)   Resp 20   SpO2 98%   Physical Exam   ED Treatments / Results  Labs (all labs ordered are listed, but only abnormal results are displayed) Labs Reviewed - No data to display  EKG EKG Interpretation  Date/Time:  Wednesday August 25 2019 08:56:04 EDT Ventricular Rate:  109 PR Interval:    QRS Duration: 67 QT Interval:  343 QTC Calculation: 462 R Axis:   -53 Text Interpretation:  Sinus tachycardia Multiple premature  complexes, vent & supraven Abnormal R-wave progression, early transition Probable inferior infarct, acute Consider anterolateral infarct st elevation I, II, aVF, unchanged  from first prior ekg of 24 August 2019 although appear new from first prior available  ekgs of 10/19 Confirmed by Pattricia Boss [id number]) on 08/25/2019 9:22:43 AM   Radiology Dg Chest Port 1 View  Result Date: 08/24/2019 CLINICAL DATA:  Pt has generalized CP, no radiation since yesterday. Pt reports it hurts to lay back or take a deep breath. Dialysis patient EXAM: PORTABLE CHEST - 1 VIEW COMPARISON:  11/03/2018 FINDINGS: Coarse bilateral infrahilar and bibasilar pulmonary opacities. No overt edema. Heart size normal.  Aortic Atherosclerosis (ICD10-170.0). No definite effusion. No pneumothorax. Visualized bones unremarkable. IMPRESSION: Coarse chronic-appearing bilateral infrahilar and basilar opacities. Electronically Signed   By: Lucrezia Europe M.D.   On: 08/24/2019 18:46   Ct Angio Chest/abd/pel For Dissection W And/or Wo Contrast  Result Date: 08/24/2019 CLINICAL DATA:  63 year old female with chest pain. Concern for aortic dissection. EXAM: CT ANGIOGRAPHY CHEST, ABDOMEN AND PELVIS TECHNIQUE: Multidetector CT imaging through the chest, abdomen and pelvis was performed using the standard protocol during bolus administration of intravenous contrast. Multiplanar reconstructed images and MIPs were obtained and reviewed to evaluate the vascular anatomy. CONTRAST:  130mL OMNIPAQUE IOHEXOL 350 MG/ML SOLN COMPARISON:  CT angiography of the chest dated 11/04/2018. FINDINGS: CTA CHEST FINDINGS Cardiovascular: There is mild cardiomegaly. No pericardial effusion. Advanced multi vessel coronary vascular calcification noted. There is mild atherosclerotic calcification of the thoracic aorta. No aneurysmal dilatation or dissection. The central pulmonary arteries appear patent as visualized. Mediastinum/Nodes: No hilar or mediastinal adenopathy. The  esophagus is grossly unremarkable. Small bilateral thyroid nodules noted. Bilateral posterior exophytic thyroid nodules measure up to 11 mm on the left which may represent exophytic thyroid nodules versus parathyroid adenoma. Ultrasound may provide better evaluation on a nonemergent basis. No mediastinal fluid collection. Lungs/Pleura: There are bibasilar linear and streaky densities most consistent with atelectatic changes. Developing infiltrate is less likely. Clinical correlation is recommended. There is a subcentimeter left upper lobe calcified granuloma. There  is no pleural effusion pneumothorax. The central airways are patent. Musculoskeletal: Degenerative changes of the spine and osteophyte. No acute osseous pathology. Review of the MIP images confirms the above findings. CTA ABDOMEN AND PELVIS FINDINGS VASCULAR Aorta: Mild atherosclerotic calcification. No dissection or aneurysm. Celiac: Patent without evidence of aneurysm, dissection, vasculitis or significant stenosis. SMA: Patent without evidence of aneurysm, dissection, vasculitis or significant stenosis. Renals: There is advanced atherosclerotic calcification of the origins of the renal arteries with decreased perfusion in the kidneys. IMA: Patent without evidence of aneurysm, dissection, vasculitis or significant stenosis. Inflow: Mild atherosclerotic calcification. No aneurysm or dissection. Veins: No obvious venous abnormality within the limitations of this arterial phase study. Review of the MIP images confirms the above findings. NON-VASCULAR No intra-abdominal free air or free fluid. Hepatobiliary: Slight irregularity of the liver contour may represent early changes of cirrhosis. Clinical correlation is recommended. No intrahepatic biliary ductal dilatation. The gallbladder is unremarkable. Pancreas: Unremarkable. No pancreatic ductal dilatation or surrounding inflammatory changes. Spleen: Normal in size without focal abnormality. Adrenals/Urinary  Tract: Left adrenal thickening/hyperplasia. The right adrenal gland is suboptimally visualized. Moderate to severe bilateral renal parenchyma atrophy, likely related to chronic kidney disease. No hydronephrosis or nephrolithiasis. Low attenuating bilateral renal lesions measure up to 6 cm on the upper pole of the right kidney. The larger lesions demonstrate fluid attenuation most consistent with cysts and the smaller lesions are suboptimally characterized. The visualized ureters appear unremarkable. The urinary bladder is collapsed. Stomach/Bowel: There is no bowel obstruction or active inflammation. The appendix is normal. Lymphatic: No adenopathy. Reproductive: Hysterectomy. Other: None Musculoskeletal: Degenerative changes of the spine. Severe arthritic changes of the left hip. Right hip arthroplasty. No acute osseous pathology. Review of the MIP images confirms the above findings. IMPRESSION: 1. No acute intrathoracic, abdominal, or pelvic pathology. No CT evidence of aortic dissection or aneurysm. 2. Advanced coronary vascular calcification and mild cardiomegaly. 3. Bibasilar atelectatic changes versus less likely infiltrate. Clinical correlation is recommended. 4. Bilateral thyroid nodules. Ultrasound may provide better evaluation on a nonemergent basis. 5. Moderate to severe bilateral renal parenchyma atrophy, likely related to chronic kidney disease. 6. No bowel obstruction or active inflammation. Normal appendix. 7. Cirrhosis. Aortic Atherosclerosis (ICD10-I70.0). Electronically Signed   By: Anner Crete M.D.   On: 08/24/2019 23:56    Procedures .Critical Care Performed by: Pattricia Boss, MD Authorized by: Pattricia Boss, MD   Critical care provider statement:    Critical care time (minutes):  45   Critical care end time:  08/25/2019 12:56 PM   Critical care was necessary to treat or prevent imminent or life-threatening deterioration of the following conditions:  Dehydration and shock    Critical care was time spent personally by me on the following activities:  Discussions with consultants, evaluation of patient's response to treatment, examination of patient, ordering and performing treatments and interventions, ordering and review of laboratory studies, ordering and review of radiographic studies, pulse oximetry, re-evaluation of patient's condition, obtaining history from patient or surrogate and review of old charts   (including critical care time)  Medications Ordered in ED Medications - No data to display   Initial Impression / Assessment and Plan / ED Course  I have reviewed the triage vital signs and the nursing notes.  Pertinent labs & imaging results that were available during my care of the patient were reviewed by me and considered in my medical decision making (see chart for details).    Discussed with Dr. Christ Kick and reviewed ekg  and does not think stemi.  Plan repeat troponins. Patient with some waxing and waning chest discomfort.  Requested cardiology consult and they will see. BP low and improved with iv fluids and respiratory status appears stable. No fever or s/s infection but lactic ordered due to hypotension. Review of prior bp reveal sbp 90-100s 11/09/18, then normotensive last nigh and decreasing. Patient describes todays chest pain as worsening with deep breath. CTA done last night and no acute pe-   Cardiovascular: There is mild cardiomegaly. No pericardial effusion. Advanced multi vessel coronary vascular calcification noted. There is mild atherosclerotic calcification of the thoracic aorta. No aneurysmal dilatation or dissection. The central pulmonary arteries appear patent as visualized.    With Dr. Jamse Arn and will see for admission  Final Clinical Impressions(s) / ED Diagnoses   Final diagnoses:  Chest pain, unspecified type  Hypotension, unspecified hypotension type    ED Discharge Orders    None       Pattricia Boss, MD  08/25/19 [id number]

## 2019-08-25 NOTE — ED Notes (Signed)
2 runs of sustained SVT Hr 150-190s with chest pain dr. Roxanne Mins and PA Upstill informed. Both have come to assess pt.

## 2019-08-25 NOTE — ED Notes (Addendum)
Pt ambulated inside the room. BP 83/48 after ambulation. 99% RA. HR 170.

## 2019-08-25 NOTE — ED Notes (Signed)
Signature pad not available for discharge signature.  

## 2019-08-25 NOTE — ED Notes (Signed)
Trending low BP midodrine and 205 mL bolus given.

## 2019-08-25 NOTE — ED Notes (Signed)
Lunch tray ordered 

## 2019-08-25 NOTE — Consult Note (Addendum)
Cardiology Consultation:   Patient ID: Brenda Arnold; RL:[id number]; [birth year]   Admit date: 08/25/2019 Date of Consult: 08/25/2019  Primary Care Provider: Kelton Pillar, MD Primary Cardiologist: Fransico Him, MD 04/18/2015 Primary Electrophysiologist:  None   Patient Profile:   Brenda Arnold is a 63 y.o. female with a hx of DM2, HTN, PAF, ESRD on HD, OSA ?CPAP, obesity, chest pain s/p MV 2011 (abnl>>med rx), MV 04/2017 w/out ischemia,  who is being seen today for the evaluation of chest pain, abnl ECG at the request of dr Jamse Arn.  History of Present Illness:   Brenda Arnold was last seen by cards when hospitalized 11/2018 for chest pain and rapid Afib. She was noted to have 3 v coronary calcifications on PE chest CT, med mgt recommended. Lipitor 40 mg added, f/u as oupt.  She has generally been doing very well. She was put on midodrine for hypotension. She has been taking it tid every day, not just HD days.   Monday, she was fine when she got up. After HD, she had a chicken parm frozen meal. About 2 hr later, she started having chest pain. Tightness and burning, 5/10 at first, reached 8-9/10. She drank 7-up, no change. She chewed some Tums, no help. Apple cider vinegar no help. Did not sleep well.   Tuesday, the pain was 4-5/10 but she was able to work. Still w/ sx, and pain got back up to 8/10, so made PCP appt. She had an ECG and was sent to ER by EMS. She got ASA. No change in pain. She was not admitted. She was discharged to HD.  Today, pt went to HD but without taking midodrine. After about 2 hr, she had palpitations and thought she might have gone into atrial fib. Pt sent back to ER. In the ER, she has gotten IVF for low BP and midodrine 2.5 mg.   The pain is worse w/ deep inspiration, some chest wall tenderness. No cough, no fever. Pain is a little worse when she lies back. If she breathes shallowly, it helps.   This is different from the chest pain she has had before.    She has not had palpitations in a long time, not till today.  She needs her L hip replaced.    Past Medical History:  Diagnosis Date   Anemia    Arthritis    Asthma    Complication of anesthesia    difficulty with getting oxygen saturation up   Diabetes mellitus    Dyslipidemia    Epistaxis 11/05/2012   ESRD (end stage renal disease) on dialysis [facility])    "MWF; [facility] Rinks" (09/15/2018)   History of nuclear stress test    Myoview 5/18: EF 68, no infarct or ischemia, low risk   HTN (hypertension)    Hyperlipidemia    Hyperparathyroidism due to renal insufficiency (HCC)    Obesity    s/p panniculectomy   Paroxysmal A-fib ([place])    a. chronic coumadin;  b. 12/2009 Echo: EF 60-65%, Gr 1 DD.   PCOS (polycystic ovarian syndrome)    Pneumonia    Seasonal allergies    Sleep apnea    a. not using CPAP, last study  >8 yrs   Vitamin D deficiency     Past Surgical History:  Procedure Laterality Date   ABDOMINAL HYSTERECTOMY     with panniculctomy   AV FISTULA PLACEMENT  09/02/2012   Procedure: ARTERIOVENOUS (AV) FISTULA CREATION;  Surgeon: Elam Dutch, MD;  Location: [facility] OR;  Service: Vascular;  Laterality: Left;  Creation of Left Radial-Cephalic Fistula    BREAST SURGERY     Biopsy right breast   COLONOSCOPY W/ BIOPSIES AND POLYPECTOMY     DILATION AND CURETTAGE OF UTERUS     KNEE ARTHROSCOPY Right    REVISON OF ARTERIOVENOUS FISTULA Left 04/27/2014   Procedure: REVISON OF LEFT RADIAL-CEPHALIC ARTERIOVENOUS FISTULA;  Surgeon: Mal Misty, MD;  Location: [place];  Service: Vascular;  Laterality: Left;   TOTAL HIP ARTHROPLASTY Right 09/15/2018   TOTAL HIP ARTHROPLASTY Right 09/15/2018   Procedure: RIGHT TOTAL HIP ARTHROPLASTY ANTERIOR APPROACH;  Surgeon: Leandrew Koyanagi, MD;  Location: [place];  Service: Orthopedics;  Laterality: Right;   UVULOPLASTY       Prior to Admission medications   Medication Sig Start Date End Date Taking? Authorizing  Provider  aspirin EC 81 MG tablet Take 1 tablet (81 mg total) by mouth 2 (two) times daily. Patient not taking: Reported on 08/25/2019 09/15/18   Leandrew Koyanagi, MD  atorvastatin (LIPITOR) 40 MG tablet Take 1 tablet (40 mg total) by mouth daily at 6 PM. Patient not taking: Reported on 08/25/2019 11/09/18   Georgette Shell, MD  bisacodyl (DULCOLAX) 10 MG suppository Place 10 mg rectally as needed for moderate constipation.    [provider]  calcitRIOL (ROCALTROL) 0.5 MCG capsule Take 3 capsules (1.5 mcg total) by mouth 3 (three) times a week. Patient not taking: Reported on 08/25/2019 11/11/18   Georgette Shell, MD  ELIQUIS 5 MG TABS tablet Take 5 mg by mouth 2 (two) times daily.  12/19/18   [provider]  ethyl chloride spray Apply 1 application topically See admin instructions. For dialysis 06/25/18   [provider]  gabapentin (NEURONTIN) 100 MG capsule Take 100 mg by mouth at bedtime. 05/14/17   [provider]  methocarbamol (ROBAXIN) 500 MG tablet Take 1 tablet (500 mg total) by mouth every 6 (six) hours as needed for muscle spasms. Patient not taking: Reported on 08/25/2019 09/15/18   Leandrew Koyanagi, MD  ondansetron (ZOFRAN) 4 MG tablet Take 1-2 tablets (4-8 mg total) by mouth every 8 (eight) hours as needed for nausea or vomiting. Patient not taking: Reported on 08/25/2019 09/15/18   Leandrew Koyanagi, MD  oxyCODONE (OXY IR/ROXICODONE) 5 MG immediate release tablet Take 1-3 tablets (5-15 mg total) by mouth every 4 (four) hours as needed. Patient not taking: Reported on 08/25/2019 09/15/18   Leandrew Koyanagi, MD  polycarbophil (FIBERCON) 625 MG tablet Take 625 mg by mouth daily.    [provider]  promethazine (PHENERGAN) 25 MG tablet Take 1 tablet (25 mg total) by mouth every 6 (six) hours as needed for nausea. Patient not taking: Reported on 08/25/2019 09/15/18   Leandrew Koyanagi, MD  senna-docusate (SENOKOT S) 8.6-50 MG tablet Take 1 tablet by mouth  at bedtime as needed. Patient not taking: Reported on 08/25/2019 09/15/18   Leandrew Koyanagi, MD  sucroferric oxyhydroxide (VELPHORO) 500 MG chewable tablet Chew 2 capsules by mouth 4 (four) times daily -  with meals and at bedtime.     [provider]  warfarin (COUMADIN) 7.5 MG tablet Take 1 tablet (7.5 mg total) by mouth daily. Patient not taking: Reported on 08/25/2019 11/09/18 11/09/19  Georgette Shell, MD    Inpatient Medications: Scheduled Meds:  aspirin EC  81 mg Oral BID   midodrine  2.5 mg Oral TID WC  Continuous Infusions:  PRN Meds:   Allergies:    Allergies  Allergen Reactions   Iodine Shortness Of Breath   Shellfish Allergy Shortness Of Breath   Norvasc [Amlodipine] Swelling    SWELLING REACTION UNSPECIFIED     Social History:   Social History   Socioeconomic History   Marital status: Single    Spouse name: Not on file   Number of children: Not on file   Years of education: Not on file   Highest education level: Not on file  Occupational History   Occupation: [employer].    Employer: [employer] resource strain: Not on file   Food insecurity    Worry: Not on file    Inability: Not on file   Transportation needs    Medical: Not on file    Non-medical: Not on file  Tobacco Use   Smoking status: Never Smoker   Smokeless tobacco: Never Used  Substance and Sexual Activity   Alcohol use: No    Alcohol/week: 0.0 standard drinks   Drug use: No   Sexual activity: Not on file  Lifestyle   Physical activity    Days per week: Not on file    Minutes per session: Not on file   Stress: Not on file  Relationships   Social connections    Talks on phone: Not on file    Gets together: Not on file    Attends religious service: Not on file    Active member of club or organization: Not on file    Attends meetings of clubs or organizations: Not on file    Relationship status: Not on  file   Intimate partner violence    Fear of current or ex partner: Not on file    Emotionally abused: Not on file    Physically abused: Not on file    Forced sexual activity: Not on file  Other Topics Concern   Not on file  Social History Narrative   Lives in [place] alone. 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    Family History:   Family History  Problem Relation Age of Onset   Lung cancer Father        died @ 74   Hypertension Mother        alive @ 75   Diabetes Brother    Kidney disease Brother    Family Status:  Family Status  Relation Name Status   Father  Deceased at age 75   Mother  14   Brother  Alive   MGM  Deceased   MGF  Deceased   [place]  Deceased   PGF  Deceased    ROS:  Please see the history of present illness.  All other ROS reviewed and negative.     Physical Exam/Data:   Vitals:   08/25/19 1245 08/25/19 1300 08/25/19 1315 08/25/19 1330  BP: (!) 85/50 (!) 73/43 (!) 72/47 (!) 73/44  Pulse: 85 86 93 93  Resp: (!) 21 20 18 16   Temp:      TempSrc:      SpO2: 100% 100% 100% 100%    Intake/Output Summary (Last 24 hours) at 08/25/2019 1512 Last data filed at 08/25/2019 1413 Gross per 24 hour  Intake 750 ml  Output --  Net 750 ml   There were no vitals filed for this visit. There is no height or weight on file to calculate BMI.  General:  Well  nourished, well developed, female in no acute distress HEENT: normal Lymph: no adenopathy Neck: JVD - not elevated Endocrine:  No thryomegaly Vascular: No carotid bruits; 3/4 extremity pulses 2+, LUE has HD graft w/ pulse there.  Cardiac:  normal S1, S2; RRR; no murmur Lungs: decreased BS bases bilaterally, no wheezing, rhonchi, some rales  Abd: soft, nontender, no hepatomegaly  Ext: no edema Musculoskeletal:  No deformities, BUE and BLE strength normal and equal Skin: warm and dry  Neuro:  CNs 2-12 intact, no focal abnormalities noted Psych:  Normal affect   EKG:   The EKG was personally reviewed and demonstrates:  SR, HR 92, J point elevation and hyperacute T waves in multiple leads. T wave inversions in AVr are deeper than 11/2018 ECG Telemetry:  Telemetry was personally reviewed and demonstrates:  SR, PVCs   CV studies:   ECHO: 11/05/2018 - Left ventricle: The cavity size was normal. There was moderate   concentric hypertrophy. Systolic function was vigorous. The   estimated ejection fraction was in the range of 65% to 70%. Wall   motion was normal; there were no regional wall motion   abnormalities. Doppler parameters are consistent with abnormal   left ventricular relaxation (grade 1 diastolic dysfunction).   There was no evidence of elevated ventricular filling pressure by   Doppler parameters. - Aortic valve: Trileaflet; mildly thickened, mildly calcified   leaflets. Transvalvular velocity was within the normal range.   There was no stenosis. There was no regurgitation. - Aortic root: The aortic root was normal in size. - Mitral valve: Calcified annulus. There was mild regurgitation. - Left atrium: The atrium was normal in size. - Right ventricle: Systolic function was normal. - Right atrium: The atrium was normal in size. - Pulmonic valve: There was no regurgitation. - Pulmonary arteries: Systolic pressure was within the normal   range. - Inferior vena cava: The vessel was normal in size. - Pericardium, extracardiac: A trivial pericardial effusion was   identified posterior to the heart. Features were not consistent   with tamponade physiology.   Laboratory Data:   Chemistry Recent Labs  Lab 08/24/19 1747 08/24/19 1802 08/25/19 0931  NA 137 137 135  K 4.6 4.4 4.6  CL 93* 95* 91*  CO2 29  --  27  GLUCOSE 93 86 80  BUN 40* 42* 24*  CREATININE 9.73* 9.90* 6.58*  CALCIUM 8.0*  --  8.1*  GFRNONAA 4*  --  6*  GFRAA 4*  --  7*  ANIONGAP 15  --  17*    Lab Results  Component Value Date   ALT 15 09/03/2018   AST 18  09/03/2018   ALKPHOS 69 09/03/2018   BILITOT 0.5 09/03/2018   Hematology Recent Labs  Lab 08/24/19 1747 08/24/19 1802 08/25/19 0931  WBC 8.9  --  9.9  RBC 4.09  --  4.28  HGB 13.1 14.6 14.0  HCT 42.3 43.0 43.5  MCV 103.4*  --  101.6*  MCH 32.0  --  32.7  MCHC 31.0  --  32.2  RDW 16.5*  --  16.1*  PLT 147*  --  146*   Cardiac Enzymes High Sensitivity Troponin:   Recent Labs  Lab 08/24/19 1747 08/24/19 2022 08/25/19 0931 08/25/19 1124  TROPONINIHS 7 8 12 10      TSH:  Lab Results  Component Value Date   TSH 0.497 11/03/2018   Lipids:No results found for: CHOL, HDL, LDLCALC, LDLDIRECT, TRIG, CHOLHDL HgbA1c: Lab Results  Component Value  Date   HGBA1C 4.9 07/21/2018   Magnesium:  Magnesium  Date Value Ref Range Status  11/04/2018 2.1 1.7 - 2.4 mg/dL Final    Comment:    Performed at [facility], [place] [address], [place], [place] [address]   Lab Results  Component Value Date   INR 1.97 11/09/2018   INR 1.64 11/08/2018   INR 1.30 11/07/2018     Radiology/Studies:  Dg Chest Port 1 View  Result Date: 08/25/2019 CLINICAL DATA:  63 year old female with history of chest pain and palpitations. EXAM: PORTABLE CHEST 1 VIEW COMPARISON:  Chest x-ray 08/24/2019. FINDINGS: Lung volumes are low. Bibasilar opacities which may reflect areas of atelectasis and/or consolidation. Small bilateral pleural effusions. No evidence of pulmonary edema. Heart size is normal. Upper mediastinal contours are within normal limits. Aortic atherosclerosis. IMPRESSION: 1. Low lung volumes with bibasilar areas of atelectasis and/or consolidation and small bilateral pleural effusions. Electronically Signed   By: Vinnie Langton M.D.   On: 08/25/2019 10:19   Dg Chest Port 1 View  Result Date: 08/24/2019 CLINICAL DATA:  Pt has generalized CP, no radiation since yesterday. Pt reports it hurts to lay back or take a deep breath. Dialysis patient EXAM: PORTABLE CHEST - 1 VIEW COMPARISON:   11/03/2018 FINDINGS: Coarse bilateral infrahilar and bibasilar pulmonary opacities. No overt edema. Heart size normal.  Aortic Atherosclerosis (ICD10-170.0). No definite effusion. No pneumothorax. Visualized bones unremarkable. IMPRESSION: Coarse chronic-appearing bilateral infrahilar and basilar opacities. Electronically Signed   By: Lucrezia Europe M.D.   On: 08/24/2019 18:46   Ct Angio Chest/abd/pel For Dissection W And/or Wo Contrast  Result Date: 08/24/2019 CLINICAL DATA:  63 year old female with chest pain. Concern for aortic dissection. EXAM: CT ANGIOGRAPHY CHEST, ABDOMEN AND PELVIS TECHNIQUE: Multidetector CT imaging through the chest, abdomen and pelvis was performed using the standard protocol during bolus administration of intravenous contrast. Multiplanar reconstructed images and MIPs were obtained and reviewed to evaluate the vascular anatomy. CONTRAST:  165mL OMNIPAQUE IOHEXOL 350 MG/ML SOLN COMPARISON:  CT angiography of the chest dated 11/04/2018. FINDINGS: CTA CHEST FINDINGS Cardiovascular: There is mild cardiomegaly. No pericardial effusion. Advanced multi vessel coronary vascular calcification noted. There is mild atherosclerotic calcification of the thoracic aorta. No aneurysmal dilatation or dissection. The central pulmonary arteries appear patent as visualized. Mediastinum/Nodes: No hilar or mediastinal adenopathy. The esophagus is grossly unremarkable. Small bilateral thyroid nodules noted. Bilateral posterior exophytic thyroid nodules measure up to 11 mm on the left which may represent exophytic thyroid nodules versus parathyroid adenoma. Ultrasound may provide better evaluation on a nonemergent basis. No mediastinal fluid collection. Lungs/Pleura: There are bibasilar linear and streaky densities most consistent with atelectatic changes. Developing infiltrate is less likely. Clinical correlation is recommended. There is a subcentimeter left upper lobe calcified granuloma. There is no pleural  effusion pneumothorax. The central airways are patent. Musculoskeletal: Degenerative changes of the spine and osteophyte. No acute osseous pathology. Review of the MIP images confirms the above findings. CTA ABDOMEN AND PELVIS FINDINGS VASCULAR Aorta: Mild atherosclerotic calcification. No dissection or aneurysm. Celiac: Patent without evidence of aneurysm, dissection, vasculitis or significant stenosis. SMA: Patent without evidence of aneurysm, dissection, vasculitis or significant stenosis. Renals: There is advanced atherosclerotic calcification of the origins of the renal arteries with decreased perfusion in the kidneys. IMA: Patent without evidence of aneurysm, dissection, vasculitis or significant stenosis. Inflow: Mild atherosclerotic calcification. No aneurysm or dissection. Veins: No obvious venous abnormality within the limitations of this arterial phase study. Review of the  MIP images confirms the above findings. NON-VASCULAR No intra-abdominal free air or free fluid. Hepatobiliary: Slight irregularity of the liver contour may represent early changes of cirrhosis. Clinical correlation is recommended. No intrahepatic biliary ductal dilatation. The gallbladder is unremarkable. Pancreas: Unremarkable. No pancreatic ductal dilatation or surrounding inflammatory changes. Spleen: Normal in size without focal abnormality. Adrenals/Urinary Tract: Left adrenal thickening/hyperplasia. The right adrenal gland is suboptimally visualized. Moderate to severe bilateral renal parenchyma atrophy, likely related to chronic kidney disease. No hydronephrosis or nephrolithiasis. Low attenuating bilateral renal lesions measure up to 6 cm on the upper pole of the right kidney. The larger lesions demonstrate fluid attenuation most consistent with cysts and the smaller lesions are suboptimally characterized. The visualized ureters appear unremarkable. The urinary bladder is collapsed. Stomach/Bowel: There is no bowel obstruction  or active inflammation. The appendix is normal. Lymphatic: No adenopathy. Reproductive: Hysterectomy. Other: None Musculoskeletal: Degenerative changes of the spine. Severe arthritic changes of the left hip. Right hip arthroplasty. No acute osseous pathology. Review of the MIP images confirms the above findings. IMPRESSION: 1. No acute intrathoracic, abdominal, or pelvic pathology. No CT evidence of aortic dissection or aneurysm. 2. Advanced coronary vascular calcification and mild cardiomegaly. 3. Bibasilar atelectatic changes versus less likely infiltrate. Clinical correlation is recommended. 4. Bilateral thyroid nodules. Ultrasound may provide better evaluation on a nonemergent basis. 5. Moderate to severe bilateral renal parenchyma atrophy, likely related to chronic kidney disease. 6. No bowel obstruction or active inflammation. Normal appendix. 7. Cirrhosis. Aortic Atherosclerosis (ICD10-I70.0). Electronically Signed   By: Anner Crete M.D.   On: 08/24/2019 23:56    Assessment and Plan:   1. Chest pain, abnl ECG - Sx are pleuritic in nature, ECG w/ diffuse ST/J point elevation - ez are negative for MI despite 48 hr of pain - BP too low to try NTG, but doubt it would help - Tums were no help - have contacted Pharmacist to see what NSAID would work best, safest for her>>they recommend Ibuprofen 600 mg tid - have ordered echo, make sure EF is ok and no WMA - no pericardial effusion seen on CT, no dissection - although current sx are pleuritic, with coronary calcifications and hx non-pleuritic chest pain, may need definitive answer with cath - decide in am  2. PAF:  - EMS strip reviewed, correlates w/ ECG/Tele here - SR w/ PVCs seen, no Afib  3. Chronic anticoag w/ Eliquis -  She is on the Eliquis now, per IM - Will need to hold if any invasive procedures planned - if held, may need to start heparin  Otherwise, per IM Principal Problem:   Chest pain Active Problems:   Type 2  diabetes mellitus with complication, without long-term current use of insulin (HCC)   OBESITY-MORBID (>100')   OSA (obstructive sleep apnea)   Hypotension   ESRD on hemodialysis ([place])   A-fib ([place])   For questions or updates, please contact [facility] Please consult [email or web address] for contact info under Cardiology/STEMI.   Signed, Rosaria Ferries, PA-C  08/25/2019 3:12 PM   I have examined the patient and reviewed assessment and plan and discussed with patient.  Agree with above as stated.  Multiple troponins are negative despite persistnet pain.  I think she has pericarditis. WIll treat with antiinflmmatories.  Not sure if we can use colchicine in ESRD patients.  Will review with pharmD.  Check echocardiogram as well.  May be at higher risk of effusion since on anticoagulation and ESRD.   Given that  her pain is clearly pleuritic, would not plan for cath or other ischemia eval.  She has known coronary calcification so she is at risk for ischemic event in the future.    Larae Grooms

## 2019-08-25 NOTE — ED Notes (Signed)
Discharge instructions discussed with pt. Pt verbalized understanding with no questions at this time.   Pt to be transported to dialysis by ED security

## 2019-08-25 NOTE — ED Triage Notes (Signed)
Pt arrives to ED from dialysis with sudden complaints of mid sternal chest pain. Patient was here yesterday for same. EMS gave 324 ASA and pt finished only half of her treatment at dialysis.

## 2019-08-26 ENCOUNTER — Observation Stay (HOSPITAL_COMMUNITY): Payer: BC Managed Care – PPO

## 2019-08-26 DIAGNOSIS — M25552 Pain in left hip: Secondary | ICD-10-CM | POA: Diagnosis not present

## 2019-08-26 DIAGNOSIS — I319 Disease of pericardium, unspecified: Secondary | ICD-10-CM | POA: Diagnosis present

## 2019-08-26 DIAGNOSIS — I300 Acute nonspecific idiopathic pericarditis: Secondary | ICD-10-CM

## 2019-08-26 DIAGNOSIS — T82898A Other specified complication of vascular prosthetic devices, implants and grafts, initial encounter: Secondary | ICD-10-CM | POA: Diagnosis present

## 2019-08-26 DIAGNOSIS — R5381 Other malaise: Secondary | ICD-10-CM | POA: Diagnosis not present

## 2019-08-26 DIAGNOSIS — I2510 Atherosclerotic heart disease of native coronary artery without angina pectoris: Secondary | ICD-10-CM | POA: Diagnosis present

## 2019-08-26 DIAGNOSIS — D62 Acute posthemorrhagic anemia: Secondary | ICD-10-CM | POA: Diagnosis not present

## 2019-08-26 DIAGNOSIS — R072 Precordial pain: Secondary | ICD-10-CM | POA: Diagnosis not present

## 2019-08-26 DIAGNOSIS — M1612 Unilateral primary osteoarthritis, left hip: Secondary | ICD-10-CM | POA: Diagnosis present

## 2019-08-26 DIAGNOSIS — R000 Tachycardia, unspecified: Secondary | ICD-10-CM | POA: Diagnosis not present

## 2019-08-26 DIAGNOSIS — E1122 Type 2 diabetes mellitus with diabetic chronic kidney disease: Secondary | ICD-10-CM | POA: Diagnosis present

## 2019-08-26 DIAGNOSIS — G8918 Other acute postprocedural pain: Secondary | ICD-10-CM | POA: Diagnosis not present

## 2019-08-26 DIAGNOSIS — I9589 Other hypotension: Secondary | ICD-10-CM | POA: Diagnosis present

## 2019-08-26 DIAGNOSIS — R570 Cardiogenic shock: Secondary | ICD-10-CM | POA: Diagnosis not present

## 2019-08-26 DIAGNOSIS — G4733 Obstructive sleep apnea (adult) (pediatric): Secondary | ICD-10-CM | POA: Diagnosis present

## 2019-08-26 DIAGNOSIS — R079 Chest pain, unspecified: Secondary | ICD-10-CM | POA: Diagnosis present

## 2019-08-26 DIAGNOSIS — I480 Paroxysmal atrial fibrillation: Secondary | ICD-10-CM | POA: Diagnosis present

## 2019-08-26 DIAGNOSIS — I953 Hypotension of hemodialysis: Secondary | ICD-10-CM | POA: Diagnosis present

## 2019-08-26 DIAGNOSIS — I472 Ventricular tachycardia: Secondary | ICD-10-CM | POA: Diagnosis not present

## 2019-08-26 DIAGNOSIS — Y712 Prosthetic and other implants, materials and accessory cardiovascular devices associated with adverse incidents: Secondary | ICD-10-CM | POA: Diagnosis present

## 2019-08-26 DIAGNOSIS — I959 Hypotension, unspecified: Secondary | ICD-10-CM | POA: Diagnosis not present

## 2019-08-26 DIAGNOSIS — N2581 Secondary hyperparathyroidism of renal origin: Secondary | ICD-10-CM | POA: Diagnosis present

## 2019-08-26 DIAGNOSIS — I700 Atherosclerosis of aorta: Secondary | ICD-10-CM | POA: Diagnosis present

## 2019-08-26 DIAGNOSIS — D638 Anemia in other chronic diseases classified elsewhere: Secondary | ICD-10-CM | POA: Diagnosis not present

## 2019-08-26 DIAGNOSIS — Z20828 Contact with and (suspected) exposure to other viral communicable diseases: Secondary | ICD-10-CM | POA: Diagnosis present

## 2019-08-26 DIAGNOSIS — I493 Ventricular premature depolarization: Secondary | ICD-10-CM | POA: Diagnosis present

## 2019-08-26 DIAGNOSIS — I309 Acute pericarditis, unspecified: Secondary | ICD-10-CM | POA: Diagnosis present

## 2019-08-26 DIAGNOSIS — I120 Hypertensive chronic kidney disease with stage 5 chronic kidney disease or end stage renal disease: Secondary | ICD-10-CM | POA: Diagnosis present

## 2019-08-26 DIAGNOSIS — E118 Type 2 diabetes mellitus with unspecified complications: Secondary | ICD-10-CM | POA: Diagnosis not present

## 2019-08-26 DIAGNOSIS — N186 End stage renal disease: Secondary | ICD-10-CM | POA: Diagnosis present

## 2019-08-26 DIAGNOSIS — I318 Other specified diseases of pericardium: Secondary | ICD-10-CM | POA: Diagnosis not present

## 2019-08-26 DIAGNOSIS — I25119 Atherosclerotic heart disease of native coronary artery with unspecified angina pectoris: Secondary | ICD-10-CM | POA: Diagnosis not present

## 2019-08-26 DIAGNOSIS — D631 Anemia in chronic kidney disease: Secondary | ICD-10-CM | POA: Diagnosis present

## 2019-08-26 DIAGNOSIS — I314 Cardiac tamponade: Secondary | ICD-10-CM | POA: Diagnosis present

## 2019-08-26 DIAGNOSIS — E785 Hyperlipidemia, unspecified: Secondary | ICD-10-CM | POA: Diagnosis present

## 2019-08-26 DIAGNOSIS — Z992 Dependence on renal dialysis: Secondary | ICD-10-CM | POA: Diagnosis not present

## 2019-08-26 DIAGNOSIS — D72829 Elevated white blood cell count, unspecified: Secondary | ICD-10-CM | POA: Diagnosis not present

## 2019-08-26 DIAGNOSIS — I313 Pericardial effusion (noninflammatory): Secondary | ICD-10-CM | POA: Diagnosis not present

## 2019-08-26 DIAGNOSIS — K5900 Constipation, unspecified: Secondary | ICD-10-CM | POA: Diagnosis not present

## 2019-08-26 DIAGNOSIS — I951 Orthostatic hypotension: Secondary | ICD-10-CM | POA: Diagnosis not present

## 2019-08-26 DIAGNOSIS — R0789 Other chest pain: Secondary | ICD-10-CM | POA: Diagnosis not present

## 2019-08-26 DIAGNOSIS — R0602 Shortness of breath: Secondary | ICD-10-CM | POA: Diagnosis not present

## 2019-08-26 DIAGNOSIS — Z96641 Presence of right artificial hip joint: Secondary | ICD-10-CM | POA: Diagnosis not present

## 2019-08-26 DIAGNOSIS — E282 Polycystic ovarian syndrome: Secondary | ICD-10-CM | POA: Diagnosis present

## 2019-08-26 LAB — GLUCOSE, CAPILLARY
Glucose-Capillary: 98 mg/dL (ref 70–99)
Glucose-Capillary: 69 mg/dL — ABNORMAL LOW (ref 70–99)
Glucose-Capillary: 89 mg/dL (ref 70–99)
Glucose-Capillary: 149 mg/dL — ABNORMAL HIGH (ref 70–99)

## 2019-08-26 LAB — CBC
HCT: 28.3 % — ABNORMAL LOW (ref 36.0–46.0)
HCT: 37.2 % (ref 36.0–46.0)
Hemoglobin: 8.8 g/dL — ABNORMAL LOW (ref 12.0–15.0)
Hemoglobin: 12.3 g/dL (ref 12.0–15.0)
MCH: 32.1 pg (ref 26.0–34.0)
MCH: 32.9 pg (ref 26.0–34.0)
MCHC: 31.1 g/dL (ref 30.0–36.0)
MCHC: 33.1 g/dL (ref 30.0–36.0)
MCV: 103.3 fL — ABNORMAL HIGH (ref 80.0–100.0)
MCV: 99.5 fL (ref 80.0–100.0)
Platelets: 152 10*3/uL (ref 150–400)
Platelets: 154 10*3/uL (ref 150–400)
RBC: 2.74 MIL/uL — ABNORMAL LOW (ref 3.87–5.11)
RBC: 3.74 MIL/uL — ABNORMAL LOW (ref 3.87–5.11)
RDW: 15.6 % — ABNORMAL HIGH (ref 11.5–15.5)
RDW: 15.7 % — ABNORMAL HIGH (ref 11.5–15.5)
WBC: 9.4 10*3/uL (ref 4.0–10.5)
WBC: 9.1 10*3/uL (ref 4.0–10.5)
nRBC: 0 % (ref 0.0–0.2)
nRBC: 0 % (ref 0.0–0.2)

## 2019-08-26 LAB — BASIC METABOLIC PANEL
Anion gap: 14 (ref 5–15)
BUN: 40 mg/dL — ABNORMAL HIGH (ref 8–23)
CO2: 25 mmol/L (ref 22–32)
Calcium: 7.3 mg/dL — ABNORMAL LOW (ref 8.9–10.3)
Chloride: 96 mmol/L — ABNORMAL LOW (ref 98–111)
Creatinine, Ser: 8.74 mg/dL — ABNORMAL HIGH (ref 0.44–1.00)
GFR calc Af Amer: 5 mL/min — ABNORMAL LOW (ref >60)
GFR calc non Af Amer: 4 mL/min — ABNORMAL LOW (ref >60)
Glucose, Bld: 72 mg/dL (ref 70–99)
Potassium: 4.6 mmol/L (ref 3.5–5.1)
Sodium: 135 mmol/L (ref 135–145)

## 2019-08-26 LAB — ECHOCARDIOGRAM COMPLETE
Height: 66 in
Weight: 3541.47 oz

## 2019-08-26 LAB — HIV ANTIBODY (ROUTINE TESTING W REFLEX): HIV Screen 4th Generation wRfx: NONREACTIVE

## 2019-08-26 MED ORDER — APIXABAN 5 MG PO TABS
5.0000 mg | ORAL_TABLET | Freq: Two times a day (BID) | ORAL | Status: DC
  Administered 2019-08-26 (×2): 5 mg via ORAL
  Filled 2019-08-26 (×2): qty 1

## 2019-08-26 MED ORDER — MORPHINE SULFATE (PF) 2 MG/ML IV SOLN
1.0000 mg | INTRAVENOUS | Status: DC | PRN
  Administered 2019-08-27: 1 mg via INTRAVENOUS

## 2019-08-26 MED ORDER — RENA-VITE PO TABS
1.0000 | ORAL_TABLET | Freq: Every day | ORAL | Status: DC
  Administered 2019-08-26 – 2019-09-08 (×14): 1 via ORAL
  Filled 2019-08-26 (×14): qty 1

## 2019-08-26 MED ORDER — CALCITRIOL 0.25 MCG PO CAPS
0.7500 ug | ORAL_CAPSULE | ORAL | Status: DC
  Administered 2019-08-27 – 2019-09-08 (×6): 0.75 ug via ORAL
  Filled 2019-08-26 (×3): qty 3

## 2019-08-26 MED ORDER — COLCHICINE 0.6 MG PO TABS
0.3000 mg | ORAL_TABLET | ORAL | Status: DC
  Administered 2019-08-28: 0.3 mg via ORAL
  Filled 2019-08-26 (×2): qty 0.5

## 2019-08-26 NOTE — Consult Note (Signed)
[facility] Renal Consultation Note    Indication for Consultation:  Management of ESRD/hemodialysis; anemia, hypertension/volume and secondary hyperparathyroidism  HPI: Brenda Arnold is a 63 y.o. female with ESRD on HD, DM, HTN< HLD, Paroxysmal Afib on Eliquis. Admitted with atypical chest pain. Presented to ED with 2 day history of chest pain worsened by inpiration. Onset was Monday after dialysis. Was seen in ED Tuesday and released. She was unable to complete dialysis on Wednesday due to chest pain and returned to ED.   ECG with diffuse ST elevation on admission. No pericardial effusion or aortic dissection on CT.  Cardiology following for possible pericarditis. Now on colchicine. Echo results pending. Labs K 4.6, BUN 40, Ca 7.3, Troponin 10, WBC 9.1 Hgb 12.3.   Dialyzes at [facility] via AVF. Has been compliant with dialysis, does not miss treatments. Due for routine HD Friday.   Seen and examined at bedside. Feels like chest pain improving but still symptomatic with deep inspiration or exertion. Noted palpitations, and elevated HR when got up to go bathroom this afternoon.   Past Medical History:  Diagnosis Date  . Anemia   . Arthritis   . Asthma   . Complication of anesthesia    difficulty with getting oxygen saturation up  . Diabetes mellitus   . Dyslipidemia   . Epistaxis 11/05/2012  . ESRD (end stage renal disease) on dialysis [facility])    "MWF; Jeneen Rinks" (09/15/2018)  . History of nuclear stress test    Myoview 5/18: EF 68, no infarct or ischemia, low risk  . HTN (hypertension)   . Hyperlipidemia   . Hyperparathyroidism due to renal insufficiency ([place])   . Obesity    s/p panniculectomy  . Paroxysmal A-fib ([facility])    a. chronic coumadin;  b. 12/2009 Echo: EF 60-65%, Gr 1 DD.  Marland Kitchen PCOS (polycystic ovarian syndrome)   . Pneumonia   . Seasonal allergies   . Sleep apnea    a. not using CPAP, last study  >8 yrs  . Vitamin D deficiency     Past Surgical History:  Procedure Laterality Date  . ABDOMINAL HYSTERECTOMY     with panniculctomy  . AV FISTULA PLACEMENT  09/02/2012   Procedure: ARTERIOVENOUS (AV) FISTULA CREATION;  Surgeon: Elam Dutch, MD;  Location: [facility] OR;  Service: Vascular;  Laterality: Left;  Creation of Left Radial-Cephalic Fistula   . BREAST SURGERY     Biopsy right breast  . COLONOSCOPY W/ BIOPSIES AND POLYPECTOMY    . DILATION AND CURETTAGE OF UTERUS    . KNEE ARTHROSCOPY Right   . REVISON OF ARTERIOVENOUS FISTULA Left 04/27/2014   Procedure: REVISON OF LEFT RADIAL-CEPHALIC ARTERIOVENOUS FISTULA;  Surgeon: Mal Misty, MD;  Location: [facility];  Service: Vascular;  Laterality: Left;  . TOTAL HIP ARTHROPLASTY Right 09/15/2018  . TOTAL HIP ARTHROPLASTY Right 09/15/2018   Procedure: RIGHT TOTAL HIP ARTHROPLASTY ANTERIOR APPROACH;  Surgeon: Leandrew Koyanagi, MD;  Location: [place];  Service: Orthopedics;  Laterality: Right;  . UVULOPLASTY     Family History  Problem Relation Age of Onset  . Lung cancer Father        died @ 77  . Hypertension Mother        alive @ 10  . Diabetes Brother   . Kidney disease Brother    Social History:  reports that she has never smoked. She has never used smokeless tobacco. She reports that she does not drink alcohol or use  drugs. Allergies  Allergen Reactions  . Iodine Shortness Of Breath  . Shellfish Allergy Shortness Of Breath  . Norvasc [Amlodipine] Swelling    SWELLING REACTION UNSPECIFIED    Prior to Admission medications   Medication Sig Start Date End Date Taking? Authorizing Provider  aspirin EC 81 MG tablet Take 1 tablet (81 mg total) by mouth 2 (two) times daily. Patient not taking: Reported on 08/25/2019 09/15/18   Leandrew Koyanagi, MD  atorvastatin (LIPITOR) 40 MG tablet Take 1 tablet (40 mg total) by mouth daily at 6 PM. Patient not taking: Reported on 08/25/2019 11/09/18   Georgette Shell, MD  bisacodyl (DULCOLAX) 10 MG suppository Place 10 mg rectally  as needed for moderate constipation.    [provider]  calcitRIOL (ROCALTROL) 0.5 MCG capsule Take 3 capsules (1.5 mcg total) by mouth 3 (three) times a week. Patient not taking: Reported on 08/25/2019 11/11/18   Georgette Shell, MD  ELIQUIS 5 MG TABS tablet Take 5 mg by mouth 2 (two) times daily.  12/19/18   [provider]  ethyl chloride spray Apply 1 application topically See admin instructions. For dialysis 06/25/18   [provider]  gabapentin (NEURONTIN) 100 MG capsule Take 100 mg by mouth at bedtime. 05/14/17   [provider]  methocarbamol (ROBAXIN) 500 MG tablet Take 1 tablet (500 mg total) by mouth every 6 (six) hours as needed for muscle spasms. Patient not taking: Reported on 08/25/2019 09/15/18   Leandrew Koyanagi, MD  ondansetron (ZOFRAN) 4 MG tablet Take 1-2 tablets (4-8 mg total) by mouth every 8 (eight) hours as needed for nausea or vomiting. Patient not taking: Reported on 08/25/2019 09/15/18   Leandrew Koyanagi, MD  oxyCODONE (OXY IR/ROXICODONE) 5 MG immediate release tablet Take 1-3 tablets (5-15 mg total) by mouth every 4 (four) hours as needed. Patient not taking: Reported on 08/25/2019 09/15/18   Leandrew Koyanagi, MD  polycarbophil (FIBERCON) 625 MG tablet Take 625 mg by mouth daily.    [provider]  promethazine (PHENERGAN) 25 MG tablet Take 1 tablet (25 mg total) by mouth every 6 (six) hours as needed for nausea. Patient not taking: Reported on 08/25/2019 09/15/18   Leandrew Koyanagi, MD  senna-docusate (SENOKOT S) 8.6-50 MG tablet Take 1 tablet by mouth at bedtime as needed. Patient not taking: Reported on 08/25/2019 09/15/18   Leandrew Koyanagi, MD  sucroferric oxyhydroxide (VELPHORO) 500 MG chewable tablet Chew 2 capsules by mouth 4 (four) times daily -  with meals and at bedtime.     [provider]   Current Facility-Administered Medications  Medication Dose Route Frequency Provider Last Rate Last Dose  . acetaminophen  (TYLENOL) tablet 650 mg  650 mg Oral Q6H PRN Vashti Hey, MD       Or  . acetaminophen (TYLENOL) suppository 650 mg  650 mg Rectal Q6H PRN Vashti Hey, MD      . apixaban Arne Cleveland) tablet 5 mg  5 mg Oral BID Furth, Cadence H, PA-C   5 mg at 08/26/19 1210  . atorvastatin (LIPITOR) tablet 40 mg  40 mg Oral q1800 Bonnell Public Tublu, MD      . colchicine tablet 0.3 mg  0.3 mg Oral Q T,Th,Sa-HD Furth, Cadence H, PA-C      . gabapentin (NEURONTIN) capsule 100 mg  100 mg Oral QHS Bonnell Public Tublu, MD   100 mg at 08/25/19 2131  . midodrine (PROAMATINE) tablet 2.5 mg  2.5 mg Oral TID WC Bonnell Public Tublu, MD   2.5 mg at 08/26/19 1211  . morphine 2 MG/ML injection 1 mg  1 mg Intravenous Q4H PRN Eliseo Squires, Jessica U, DO      . sodium chloride 0.9 % bolus 500 mL  500 mL Intravenous Once Vashti Hey, MD   Stopped at 08/25/19 1705  . sucroferric oxyhydroxide (VELPHORO) chewable tablet 1,000 mg  1,000 mg Oral TID WC & HS Bonnell Public Tublu, MD   1,000 mg at 08/26/19 1214     ROS: As per HPI otherwise negative.  Physical Exam: Vitals:   08/26/19 0715 08/26/19 0858 08/26/19 1131 08/26/19 1300  BP: (!) 88/51 (!) 94/21 (!) 125/34 (!) 117/41  Pulse: 87  86 (!) 105  Resp:      Temp:  98.5 F (36.9 C) 98.3 F (36.8 C) 99 F (37.2 C)  TempSrc:  Oral Oral Oral  SpO2: 98%  100% 100%  Weight:      Height:         General: WDWN female NAD  Head: NCAT sclera not icteric  Neck: Supple. No JVD No masses Lungs: CTA bilaterally without wheezes, rales, or rhonchi. Breathing is unlabored. Heart:  Tachy No murmur, rub, or gallop  Abdomen: soft NT + BS Lower extremities:without edema or ischemic changes, no open wounds  Neuro: A & O  X 3. Moves all extremities spontaneously. Psych:  Responds to questions appropriately with a normal affect. Dialysis Access: L forearm AVF, aneurysmal +bruit   Labs: Basic Metabolic Panel: Recent Labs  Lab  08/24/19 1747 08/24/19 1802 08/25/19 0931 08/26/19 0348  NA 137 137 135 135  K 4.6 4.4 4.6 4.6  CL 93* 95* 91* 96*  CO2 29  --  27 25  GLUCOSE 93 86 80 72  BUN 40* 42* 24* 40*  CREATININE 9.73* 9.90* 6.58* 8.74*  CALCIUM 8.0*  --  8.1* 7.3*   Liver Function Tests: No results for input(s): AST, ALT, ALKPHOS, BILITOT, PROT, ALBUMIN in the last 168 hours. No results for input(s): LIPASE, AMYLASE in the last 168 hours. No results for input(s): AMMONIA in the last 168 hours. CBC: Recent Labs  Lab 08/24/19 1747  08/25/19 0931 08/26/19 0348 08/26/19 0859  WBC 8.9  --  9.9 9.4 9.1  NEUTROABS 6.5  --   --   --   --   HGB 13.1   < > 14.0 8.8* 12.3  HCT 42.3   < > 43.5 28.3* 37.2  MCV 103.4*  --  101.6* 103.3* 99.5  PLT 147*  --  146* 152 154   < > = values in this interval not displayed.   Cardiac Enzymes: No results for input(s): CKTOTAL, CKMB, CKMBINDEX, TROPONINI in the last 168 hours. CBG: Recent Labs  Lab 08/25/19 2131 08/26/19 0802 08/26/19 1130  GLUCAP 94 98 69*   Iron Studies: No results for input(s): IRON, TIBC, TRANSFERRIN, FERRITIN in the last 72 hours. Studies/Results: Dg Chest Port 1 View  Result Date: 08/25/2019 CLINICAL DATA:  62 year old female with history of chest pain and palpitations. EXAM: PORTABLE CHEST 1 VIEW COMPARISON:  Chest x-ray 08/24/2019. FINDINGS: Lung volumes are low. Bibasilar opacities which may reflect areas of atelectasis and/or consolidation. Small bilateral pleural effusions. No evidence of pulmonary edema. Heart size is normal. Upper mediastinal contours are within normal limits. Aortic atherosclerosis. IMPRESSION: 1. Low lung volumes with bibasilar areas of atelectasis and/or consolidation and small bilateral pleural effusions. Electronically Signed   By:  Vinnie Langton M.D.   On: 08/25/2019 10:19   Dg Chest Port 1 View  Result Date: 08/24/2019 CLINICAL DATA:  Pt has generalized CP, no radiation since yesterday. Pt reports it hurts to  lay back or take a deep breath. Dialysis patient EXAM: PORTABLE CHEST - 1 VIEW COMPARISON:  11/03/2018 FINDINGS: Coarse bilateral infrahilar and bibasilar pulmonary opacities. No overt edema. Heart size normal.  Aortic Atherosclerosis (ICD10-170.0). No definite effusion. No pneumothorax. Visualized bones unremarkable. IMPRESSION: Coarse chronic-appearing bilateral infrahilar and basilar opacities. Electronically Signed   By: Lucrezia Europe M.D.   On: 08/24/2019 18:46   Ct Angio Chest/abd/pel For Dissection W And/or Wo Contrast  Result Date: 08/24/2019 CLINICAL DATA:  63 year old female with chest pain. Concern for aortic dissection. EXAM: CT ANGIOGRAPHY CHEST, ABDOMEN AND PELVIS TECHNIQUE: Multidetector CT imaging through the chest, abdomen and pelvis was performed using the standard protocol during bolus administration of intravenous contrast. Multiplanar reconstructed images and MIPs were obtained and reviewed to evaluate the vascular anatomy. CONTRAST:  172mL OMNIPAQUE IOHEXOL 350 MG/ML SOLN COMPARISON:  CT angiography of the chest dated 11/04/2018. FINDINGS: CTA CHEST FINDINGS Cardiovascular: There is mild cardiomegaly. No pericardial effusion. Advanced multi vessel coronary vascular calcification noted. There is mild atherosclerotic calcification of the thoracic aorta. No aneurysmal dilatation or dissection. The central pulmonary arteries appear patent as visualized. Mediastinum/Nodes: No hilar or mediastinal adenopathy. The esophagus is grossly unremarkable. Small bilateral thyroid nodules noted. Bilateral posterior exophytic thyroid nodules measure up to 11 mm on the left which may represent exophytic thyroid nodules versus parathyroid adenoma. Ultrasound may provide better evaluation on a nonemergent basis. No mediastinal fluid collection. Lungs/Pleura: There are bibasilar linear and streaky densities most consistent with atelectatic changes. Developing infiltrate is less likely. Clinical correlation is  recommended. There is a subcentimeter left upper lobe calcified granuloma. There is no pleural effusion pneumothorax. The central airways are patent. Musculoskeletal: Degenerative changes of the spine and osteophyte. No acute osseous pathology. Review of the MIP images confirms the above findings. CTA ABDOMEN AND PELVIS FINDINGS VASCULAR Aorta: Mild atherosclerotic calcification. No dissection or aneurysm. Celiac: Patent without evidence of aneurysm, dissection, vasculitis or significant stenosis. SMA: Patent without evidence of aneurysm, dissection, vasculitis or significant stenosis. Renals: There is advanced atherosclerotic calcification of the origins of the renal arteries with decreased perfusion in the kidneys. IMA: Patent without evidence of aneurysm, dissection, vasculitis or significant stenosis. Inflow: Mild atherosclerotic calcification. No aneurysm or dissection. Veins: No obvious venous abnormality within the limitations of this arterial phase study. Review of the MIP images confirms the above findings. NON-VASCULAR No intra-abdominal free air or free fluid. Hepatobiliary: Slight irregularity of the liver contour may represent early changes of cirrhosis. Clinical correlation is recommended. No intrahepatic biliary ductal dilatation. The gallbladder is unremarkable. Pancreas: Unremarkable. No pancreatic ductal dilatation or surrounding inflammatory changes. Spleen: Normal in size without focal abnormality. Adrenals/Urinary Tract: Left adrenal thickening/hyperplasia. The right adrenal gland is suboptimally visualized. Moderate to severe bilateral renal parenchyma atrophy, likely related to chronic kidney disease. No hydronephrosis or nephrolithiasis. Low attenuating bilateral renal lesions measure up to 6 cm on the upper pole of the right kidney. The larger lesions demonstrate fluid attenuation most consistent with cysts and the smaller lesions are suboptimally characterized. The visualized ureters appear  unremarkable. The urinary bladder is collapsed. Stomach/Bowel: There is no bowel obstruction or active inflammation. The appendix is normal. Lymphatic: No adenopathy. Reproductive: Hysterectomy. Other: None Musculoskeletal: Degenerative changes of the spine. Severe arthritic changes of the left  hip. Right hip arthroplasty. No acute osseous pathology. Review of the MIP images confirms the above findings. IMPRESSION: 1. No acute intrathoracic, abdominal, or pelvic pathology. No CT evidence of aortic dissection or aneurysm. 2. Advanced coronary vascular calcification and mild cardiomegaly. 3. Bibasilar atelectatic changes versus less likely infiltrate. Clinical correlation is recommended. 4. Bilateral thyroid nodules. Ultrasound may provide better evaluation on a nonemergent basis. 5. Moderate to severe bilateral renal parenchyma atrophy, likely related to chronic kidney disease. 6. No bowel obstruction or active inflammation. Normal appendix. 7. Cirrhosis. Aortic Atherosclerosis (ICD10-I70.0). Electronically Signed   By: Anner Crete M.D.   On: 08/24/2019 23:56    Dialysis Orders:  [facility] MWF 4H 200Re BFR 450 EDW 99kg 2K/2Ca Heparin 10000 Access AVF Parsabiv 15 Calcitriol 0.75 Recent OP Labs: Hgb 12.2 K 4.7 Ca 8.3 Phos 3.9 PTH 1960   Assessment/Plan: 1. Atypical chest pain. Concern for pericarditis. On colchicine. Echo pending. Cardiology following  2. P Afib - On Eliquis  3. ESRD -  HD MWF. Continue on schedule. Next HD 9/25 4. Hypertension/volume  - BP controlled. On midodrine for BP support. No gross volume excess on exam. UF to EDW.  5. Anemia  - Hgb >12 No ESA needs.  6. Metabolic bone disease -  Continue binders. On Parasbiv for [facility] - not available in hospital. Resume at discharge.   Lynnda Child PA-C [facility] Pager [phone] 08/26/2019, 2:58 PM

## 2019-08-26 NOTE — Progress Notes (Signed)
Hgb this AM was reportedly 8.8 and anticoagulation was held. Redraw revealed Hgb 12.3. Suspect lab error. Will restart Eliquis at this time. Called and notified the nurse  Kathee Tumlin Kathlen Mody, [place]

## 2019-08-26 NOTE — Progress Notes (Signed)
[facility] PROGRESS NOTE  Brenda Arnold [id number] DOB: [birth year] DOA: 08/25/2019 PCP: Kelton Pillar, MD  Assessment/Plan:  #1. Chest pain/tachycardia/pericarditis. Chest pain associated with tachycardia as well. Hx cad s/p MV 2018 w/out ischemia, afib, trops negative, evaluated by cardiology who opine pain clearly pleuritic and believe she has pericarditis. Given her continued chest pain and associated tachycardia will monitor closely and provide NSAID and morphine -follow echo results -stop asa per cards -continue eliquis -anti-inflamatory per cards -morphine for CP per cards  #2. ESRD. MWF dialysis. Has not missed any sessions. Potassium 4.6. -nephrology has been notified  #3. Hypotension. Hx of same. Home meds include midodrine. SBP range 88-125.  -continue midodrine -monitor  #4. Diabetes. Diet controlled. Serum glucose 72 this am  #5. PAF. Chart review indicates patient with hx of same and has CP when in RVR. Currently NSR. Home meds include eliquis. -continue eliquis    Code Status: full Family Communication: pateint Disposition Plan: home 24 36 hours   Consultants:  varanasi cards  [facility] nephrology  Procedures:  echo  Antibiotics:    HPI/Subjective: Sitting on side of bed washing up. Denies pain/discomfort. Did have brief episode CP and HR went to 140 and after 5-10 seconds pain subsided and HR came down to 110.   Objective: Vitals:   08/26/19 0858 08/26/19 1131  BP: (!) 94/21 (!) 125/34  Pulse:  86  Resp:    Temp: 98.5 F (36.9 C) 98.3 F (36.8 C)  SpO2:  100%    Intake/Output Summary (Last 24 hours) at 08/26/2019 1235 Last data filed at 08/25/2019 1413 Gross per 24 hour  Intake 250 ml  Output -  Net 250 ml   Filed Weights   08/26/19 0655  Weight: 100.4 kg    Exam:   General:  Obese alert no acute distress  Cardiovascular: tachycardia regular trace LE edema, no mgr  Respiratory: normal effort BS clear but  somewhat distant no wheeze  Abdomen: obese soft +BS no guarding or rebounding  Musculoskeletal: joints without swelling/erythema   Data Reviewed: Basic Metabolic Panel: Recent Labs  Lab 08/24/19 1747 08/24/19 1802 08/25/19 0931 08/26/19 0348  NA 137 137 135 135  K 4.6 4.4 4.6 4.6  CL 93* 95* 91* 96*  CO2 29  --  27 25  GLUCOSE 93 86 80 72  BUN 40* 42* 24* 40*  CREATININE 9.73* 9.90* 6.58* 8.74*  CALCIUM 8.0*  --  8.1* 7.3*   Liver Function Tests: No results for input(s): AST, ALT, ALKPHOS, BILITOT, PROT, ALBUMIN in the last 168 hours. No results for input(s): LIPASE, AMYLASE in the last 168 hours. No results for input(s): AMMONIA in the last 168 hours. CBC: Recent Labs  Lab 08/24/19 1747 08/24/19 1802 08/25/19 0931 08/26/19 0348 08/26/19 0859  WBC 8.9  --  9.9 9.4 9.1  NEUTROABS 6.5  --   --   --   --   HGB 13.1 14.6 14.0 8.8* 12.3  HCT 42.3 43.0 43.5 28.3* 37.2  MCV 103.4*  --  101.6* 103.3* 99.5  PLT 147*  --  146* 152 154   Cardiac Enzymes: No results for input(s): CKTOTAL, CKMB, CKMBINDEX, TROPONINI in the last 168 hours. BNP (last 3 results) No results for input(s): BNP in the last 8760 hours.  ProBNP (last 3 results) No results for input(s): PROBNP in the last 8760 hours.  CBG: Recent Labs  Lab 08/25/19 2131 08/26/19 0802 08/26/19 1130  GLUCAP 94 98 69*    Recent Results (  from the past 240 hour(s))  SARS CORONAVIRUS 2 (TAT 6-24 HRS) Nasopharyngeal Nasopharyngeal Swab     Status: None   Collection Time: 08/24/19  5:47 PM   Specimen: Nasopharyngeal Swab  Result Value Ref Range Status   SARS Coronavirus 2 NEGATIVE NEGATIVE Final    Comment: (NOTE) SARS-CoV-2 target nucleic acids are NOT DETECTED. The SARS-CoV-2 RNA is generally detectable in upper and lower respiratory specimens during the acute phase of infection. Negative results do not preclude SARS-CoV-2 infection, do not rule out co-infections with other pathogens, and should not be used  as the sole basis for treatment or other patient management decisions. Negative results must be combined with clinical observations, patient history, and epidemiological information. The expected result is Negative. Fact Sheet for Patients: SugarRoll.be Fact Sheet for Healthcare Providers: https://www.woods-mathews.com/ This test is not yet approved or cleared by the [place] FDA and  has been authorized for detection and/or diagnosis of SARS-CoV-2 by FDA under an Emergency Use Authorization (EUA). This EUA will remain  in effect (meaning this test can be used) for the duration of the COVID-19 declaration under Section 56 4(b)(1) of the Act, 21 U.S.C. section 360bbb-3(b)(1), unless the authorization is terminated or revoked sooner. Performed at [facility], [place] [address], [place], [place] [address]   SARS CORONAVIRUS 2 (TAT 6-24 HRS) Nasopharyngeal Nasopharyngeal Swab     Status: None   Collection Time: 08/25/19 12:22 PM   Specimen: Nasopharyngeal Swab  Result Value Ref Range Status   SARS Coronavirus 2 NEGATIVE NEGATIVE Final    Comment: (NOTE) SARS-CoV-2 target nucleic acids are NOT DETECTED. The SARS-CoV-2 RNA is generally detectable in upper and lower respiratory specimens during the acute phase of infection. Negative results do not preclude SARS-CoV-2 infection, do not rule out co-infections with other pathogens, and should not be used as the sole basis for treatment or other patient management decisions. Negative results must be combined with clinical observations, patient history, and epidemiological information. The expected result is Negative. Fact Sheet for Patients: SugarRoll.be Fact Sheet for Healthcare Providers: https://www.woods-mathews.com/ This test is not yet approved or cleared by the [place] FDA and  has been authorized for detection and/or diagnosis of  SARS-CoV-2 by FDA under an Emergency Use Authorization (EUA). This EUA will remain  in effect (meaning this test can be used) for the duration of the COVID-19 declaration under Section 56 4(b)(1) of the Act, 21 U.S.C. section 360bbb-3(b)(1), unless the authorization is terminated or revoked sooner. Performed at [facility], [place] [address] [place], [place] [address]      Studies: Dg Chest Port 1 View  Result Date: 08/25/2019 CLINICAL DATA:  63 year old female with history of chest pain and palpitations. EXAM: PORTABLE CHEST 1 VIEW COMPARISON:  Chest x-ray 08/24/2019. FINDINGS: Lung volumes are low. Bibasilar opacities which may reflect areas of atelectasis and/or consolidation. Small bilateral pleural effusions. No evidence of pulmonary edema. Heart size is normal. Upper mediastinal contours are within normal limits. Aortic atherosclerosis. IMPRESSION: 1. Low lung volumes with bibasilar areas of atelectasis and/or consolidation and small bilateral pleural effusions. Electronically Signed   By: Vinnie Langton M.D.   On: 08/25/2019 10:19   Dg Chest Port 1 View  Result Date: 08/24/2019 CLINICAL DATA:  Pt has generalized CP, no radiation since yesterday. Pt reports it hurts to lay back or take a deep breath. Dialysis patient EXAM: PORTABLE CHEST - 1 VIEW COMPARISON:  11/03/2018 FINDINGS: Coarse bilateral infrahilar and bibasilar pulmonary opacities. No  overt edema. Heart size normal.  Aortic Atherosclerosis (ICD10-170.0). No definite effusion. No pneumothorax. Visualized bones unremarkable. IMPRESSION: Coarse chronic-appearing bilateral infrahilar and basilar opacities. Electronically Signed   By: Lucrezia Europe M.D.   On: 08/24/2019 18:46   Ct Angio Chest/abd/pel For Dissection W And/or Wo Contrast  Result Date: 08/24/2019 CLINICAL DATA:  63 year old female with chest pain. Concern for aortic dissection. EXAM: CT ANGIOGRAPHY CHEST, ABDOMEN AND PELVIS TECHNIQUE: Multidetector CT imaging  through the chest, abdomen and pelvis was performed using the standard protocol during bolus administration of intravenous contrast. Multiplanar reconstructed images and MIPs were obtained and reviewed to evaluate the vascular anatomy. CONTRAST:  163mL OMNIPAQUE IOHEXOL 350 MG/ML SOLN COMPARISON:  CT angiography of the chest dated 11/04/2018. FINDINGS: CTA CHEST FINDINGS Cardiovascular: There is mild cardiomegaly. No pericardial effusion. Advanced multi vessel coronary vascular calcification noted. There is mild atherosclerotic calcification of the thoracic aorta. No aneurysmal dilatation or dissection. The central pulmonary arteries appear patent as visualized. Mediastinum/Nodes: No hilar or mediastinal adenopathy. The esophagus is grossly unremarkable. Small bilateral thyroid nodules noted. Bilateral posterior exophytic thyroid nodules measure up to 11 mm on the left which may represent exophytic thyroid nodules versus parathyroid adenoma. Ultrasound may provide better evaluation on a nonemergent basis. No mediastinal fluid collection. Lungs/Pleura: There are bibasilar linear and streaky densities most consistent with atelectatic changes. Developing infiltrate is less likely. Clinical correlation is recommended. There is a subcentimeter left upper lobe calcified granuloma. There is no pleural effusion pneumothorax. The central airways are patent. Musculoskeletal: Degenerative changes of the spine and osteophyte. No acute osseous pathology. Review of the MIP images confirms the above findings. CTA ABDOMEN AND PELVIS FINDINGS VASCULAR Aorta: Mild atherosclerotic calcification. No dissection or aneurysm. Celiac: Patent without evidence of aneurysm, dissection, vasculitis or significant stenosis. SMA: Patent without evidence of aneurysm, dissection, vasculitis or significant stenosis. Renals: There is advanced atherosclerotic calcification of the origins of the renal arteries with decreased perfusion in the kidneys.  IMA: Patent without evidence of aneurysm, dissection, vasculitis or significant stenosis. Inflow: Mild atherosclerotic calcification. No aneurysm or dissection. Veins: No obvious venous abnormality within the limitations of this arterial phase study. Review of the MIP images confirms the above findings. NON-VASCULAR No intra-abdominal free air or free fluid. Hepatobiliary: Slight irregularity of the liver contour may represent early changes of cirrhosis. Clinical correlation is recommended. No intrahepatic biliary ductal dilatation. The gallbladder is unremarkable. Pancreas: Unremarkable. No pancreatic ductal dilatation or surrounding inflammatory changes. Spleen: Normal in size without focal abnormality. Adrenals/Urinary Tract: Left adrenal thickening/hyperplasia. The right adrenal gland is suboptimally visualized. Moderate to severe bilateral renal parenchyma atrophy, likely related to chronic kidney disease. No hydronephrosis or nephrolithiasis. Low attenuating bilateral renal lesions measure up to 6 cm on the upper pole of the right kidney. The larger lesions demonstrate fluid attenuation most consistent with cysts and the smaller lesions are suboptimally characterized. The visualized ureters appear unremarkable. The urinary bladder is collapsed. Stomach/Bowel: There is no bowel obstruction or active inflammation. The appendix is normal. Lymphatic: No adenopathy. Reproductive: Hysterectomy. Other: None Musculoskeletal: Degenerative changes of the spine. Severe arthritic changes of the left hip. Right hip arthroplasty. No acute osseous pathology. Review of the MIP images confirms the above findings. IMPRESSION: 1. No acute intrathoracic, abdominal, or pelvic pathology. No CT evidence of aortic dissection or aneurysm. 2. Advanced coronary vascular calcification and mild cardiomegaly. 3. Bibasilar atelectatic changes versus less likely infiltrate. Clinical correlation is recommended. 4. Bilateral thyroid nodules.  Ultrasound may provide better evaluation  on a nonemergent basis. 5. Moderate to severe bilateral renal parenchyma atrophy, likely related to chronic kidney disease. 6. No bowel obstruction or active inflammation. Normal appendix. 7. Cirrhosis. Aortic Atherosclerosis (ICD10-I70.0). Electronically Signed   By: Anner Crete M.D.   On: 08/24/2019 23:56    Scheduled Meds: . apixaban  5 mg Oral BID  . atorvastatin  40 mg Oral q1800  . colchicine  0.3 mg Oral Q T,Th,Sa-HD  . gabapentin  100 mg Oral QHS  . midodrine  2.5 mg Oral TID WC  . sucroferric oxyhydroxide  1,000 mg Oral TID WC & HS   Continuous Infusions: . sodium chloride Stopped (08/25/19 1705)    Principal Problem:   Chest pain Active Problems:   Hypotension   ESRD on hemodialysis (HCC)   Tachycardia   Pericarditis   Type 2 diabetes mellitus with complication, without long-term current use of insulin (HCC)   CAD (coronary artery disease)   OBESITY   OBESITY-MORBID (>100')   OSA (obstructive sleep apnea)    Time spent: 45 minutes    [place] NP  [facility]  If 7PM-7AM, please contact night-coverage at [email or web address], password [email or web address] 08/26/2019, 12:35 PM  LOS: 0 days

## 2019-08-26 NOTE — Progress Notes (Signed)
  Echocardiogram 2D Echocardiogram has been performed.  Brenda Arnold 08/26/2019, 10:58 AM

## 2019-08-26 NOTE — Progress Notes (Signed)
Patient getting washed up at sink and started feeling heart palpitations, called for RN. Pt assisted back to bed, HR in 150s. Pt HR recovered to 118. Denies any CP or lightheadedness, can feel HR "coming down". MD notified. Will continue to monitor.

## 2019-08-26 NOTE — Progress Notes (Addendum)
Progress Note  Patient Name: Brenda Arnold Date of Encounter: 08/26/2019  Primary Cardiologist: Fransico Him, MD   Subjective   Chest pain greatly improved. Still short of breath on exertion. Hgb dropped today 14 to 8.8.   Inpatient Medications    Scheduled Meds:  apixaban  5 mg Oral BID   aspirin EC  81 mg Oral BID   atorvastatin  40 mg Oral q1800   gabapentin  100 mg Oral QHS   ibuprofen  600 mg Oral TID   midodrine  2.5 mg Oral TID WC   sucroferric oxyhydroxide  1,000 mg Oral TID WC & HS   Continuous Infusions:  sodium chloride Stopped (08/25/19 1705)   PRN Meds: acetaminophen **OR** acetaminophen   Vital Signs    Vitals:   08/25/19 2133 08/26/19 0638 08/26/19 0655 08/26/19 0715  BP: (!) 101/48   (!) 88/51  Pulse: 93   87  Resp:      Temp: 98.2 F (36.8 C) 97.6 F (36.4 C)    TempSrc: Oral Oral    SpO2: 100%   98%  Weight:   100.4 kg   Height:  5\' 6"  (1.676 m)      Intake/Output Summary (Last 24 hours) at 08/26/2019 0817 Last data filed at 08/25/2019 1413 Gross per 24 hour  Intake 750 ml  Output --  Net 750 ml   Last 3 Weights 08/26/2019 08/24/2019 11/09/2018  Weight (lbs) 221 lb 5.5 oz 219 lb 224 lb 3.3 oz  Weight (kg) 100.4 kg 99.338 kg 101.7 kg      Telemetry    NSR, rates in the 70-80s; had previous ST elevation - Personally Reviewed  ECG    No new - Personally Reviewed  Physical Exam   GEN: No acute distress.   Neck: No JVD Cardiac: RRR, no murmurs, rubs, or gallops.  Respiratory: Clear to auscultation bilaterally. GI: Soft, nontender, non-distended  MS: No edema; No deformity. Neuro:  Nonfocal  Psych: Normal affect   Labs    High Sensitivity Troponin:   Recent Labs  Lab 08/24/19 1747 08/24/19 2022 08/25/19 0931 08/25/19 1124  TROPONINIHS 7 8 12 10       Chemistry Recent Labs  Lab 08/24/19 1747 08/24/19 1802 08/25/19 0931 08/26/19 0348  NA 137 137 135 135  K 4.6 4.4 4.6 4.6  CL 93* 95* 91* 96*  CO2 29   --  27 25  GLUCOSE 93 86 80 72  BUN 40* 42* 24* 40*  CREATININE 9.73* 9.90* 6.58* 8.74*  CALCIUM 8.0*  --  8.1* 7.3*  GFRNONAA 4*  --  6* 4*  GFRAA 4*  --  7* 5*  ANIONGAP 15  --  17* 14     Hematology Recent Labs  Lab 08/24/19 1747 08/24/19 1802 08/25/19 0931 08/26/19 0348  WBC 8.9  --  9.9 9.4  RBC 4.09  --  4.28 2.74*  HGB 13.1 14.6 14.0 8.8*  HCT 42.3 43.0 43.5 28.3*  MCV 103.4*  --  101.6* 103.3*  MCH 32.0  --  32.7 32.1  MCHC 31.0  --  32.2 31.1  RDW 16.5*  --  16.1* 15.6*  PLT 147*  --  146* 152    BNPNo results for input(s): BNP, PROBNP in the last 168 hours.   DDimer No results for input(s): DDIMER in the last 168 hours.   Radiology    Dg Chest Port 1 View  Result Date: 08/25/2019 CLINICAL DATA:  63 year old female with history  of chest pain and palpitations. EXAM: PORTABLE CHEST 1 VIEW COMPARISON:  Chest x-ray 08/24/2019. FINDINGS: Lung volumes are low. Bibasilar opacities which may reflect areas of atelectasis and/or consolidation. Small bilateral pleural effusions. No evidence of pulmonary edema. Heart size is normal. Upper mediastinal contours are within normal limits. Aortic atherosclerosis. IMPRESSION: 1. Low lung volumes with bibasilar areas of atelectasis and/or consolidation and small bilateral pleural effusions. Electronically Signed   By: Vinnie Langton M.D.   On: 08/25/2019 10:19   Dg Chest Port 1 View  Result Date: 08/24/2019 CLINICAL DATA:  Pt has generalized CP, no radiation since yesterday. Pt reports it hurts to lay back or take a deep breath. Dialysis patient EXAM: PORTABLE CHEST - 1 VIEW COMPARISON:  11/03/2018 FINDINGS: Coarse bilateral infrahilar and bibasilar pulmonary opacities. No overt edema. Heart size normal.  Aortic Atherosclerosis (ICD10-170.0). No definite effusion. No pneumothorax. Visualized bones unremarkable. IMPRESSION: Coarse chronic-appearing bilateral infrahilar and basilar opacities. Electronically Signed   By: Lucrezia Europe M.D.    On: 08/24/2019 18:46   Ct Angio Chest/abd/pel For Dissection W And/or Wo Contrast  Result Date: 08/24/2019 CLINICAL DATA:  63 year old female with chest pain. Concern for aortic dissection. EXAM: CT ANGIOGRAPHY CHEST, ABDOMEN AND PELVIS TECHNIQUE: Multidetector CT imaging through the chest, abdomen and pelvis was performed using the standard protocol during bolus administration of intravenous contrast. Multiplanar reconstructed images and MIPs were obtained and reviewed to evaluate the vascular anatomy. CONTRAST:  169mL OMNIPAQUE IOHEXOL 350 MG/ML SOLN COMPARISON:  CT angiography of the chest dated 11/04/2018. FINDINGS: CTA CHEST FINDINGS Cardiovascular: There is mild cardiomegaly. No pericardial effusion. Advanced multi vessel coronary vascular calcification noted. There is mild atherosclerotic calcification of the thoracic aorta. No aneurysmal dilatation or dissection. The central pulmonary arteries appear patent as visualized. Mediastinum/Nodes: No hilar or mediastinal adenopathy. The esophagus is grossly unremarkable. Small bilateral thyroid nodules noted. Bilateral posterior exophytic thyroid nodules measure up to 11 mm on the left which may represent exophytic thyroid nodules versus parathyroid adenoma. Ultrasound may provide better evaluation on a nonemergent basis. No mediastinal fluid collection. Lungs/Pleura: There are bibasilar linear and streaky densities most consistent with atelectatic changes. Developing infiltrate is less likely. Clinical correlation is recommended. There is a subcentimeter left upper lobe calcified granuloma. There is no pleural effusion pneumothorax. The central airways are patent. Musculoskeletal: Degenerative changes of the spine and osteophyte. No acute osseous pathology. Review of the MIP images confirms the above findings. CTA ABDOMEN AND PELVIS FINDINGS VASCULAR Aorta: Mild atherosclerotic calcification. No dissection or aneurysm. Celiac: Patent without evidence of  aneurysm, dissection, vasculitis or significant stenosis. SMA: Patent without evidence of aneurysm, dissection, vasculitis or significant stenosis. Renals: There is advanced atherosclerotic calcification of the origins of the renal arteries with decreased perfusion in the kidneys. IMA: Patent without evidence of aneurysm, dissection, vasculitis or significant stenosis. Inflow: Mild atherosclerotic calcification. No aneurysm or dissection. Veins: No obvious venous abnormality within the limitations of this arterial phase study. Review of the MIP images confirms the above findings. NON-VASCULAR No intra-abdominal free air or free fluid. Hepatobiliary: Slight irregularity of the liver contour may represent early changes of cirrhosis. Clinical correlation is recommended. No intrahepatic biliary ductal dilatation. The gallbladder is unremarkable. Pancreas: Unremarkable. No pancreatic ductal dilatation or surrounding inflammatory changes. Spleen: Normal in size without focal abnormality. Adrenals/Urinary Tract: Left adrenal thickening/hyperplasia. The right adrenal gland is suboptimally visualized. Moderate to severe bilateral renal parenchyma atrophy, likely related to chronic kidney disease. No hydronephrosis or nephrolithiasis. Low attenuating bilateral renal  lesions measure up to 6 cm on the upper pole of the right kidney. The larger lesions demonstrate fluid attenuation most consistent with cysts and the smaller lesions are suboptimally characterized. The visualized ureters appear unremarkable. The urinary bladder is collapsed. Stomach/Bowel: There is no bowel obstruction or active inflammation. The appendix is normal. Lymphatic: No adenopathy. Reproductive: Hysterectomy. Other: None Musculoskeletal: Degenerative changes of the spine. Severe arthritic changes of the left hip. Right hip arthroplasty. No acute osseous pathology. Review of the MIP images confirms the above findings. IMPRESSION: 1. No acute  intrathoracic, abdominal, or pelvic pathology. No CT evidence of aortic dissection or aneurysm. 2. Advanced coronary vascular calcification and mild cardiomegaly. 3. Bibasilar atelectatic changes versus less likely infiltrate. Clinical correlation is recommended. 4. Bilateral thyroid nodules. Ultrasound may provide better evaluation on a nonemergent basis. 5. Moderate to severe bilateral renal parenchyma atrophy, likely related to chronic kidney disease. 6. No bowel obstruction or active inflammation. Normal appendix. 7. Cirrhosis. Aortic Atherosclerosis (ICD10-I70.0). Electronically Signed   By: Anner Crete M.D.   On: 08/24/2019 23:56    Cardiac Studies   Echo pending  ECHO: 11/05/2018 - Left ventricle: The cavity size was normal. There was moderate concentric hypertrophy. Systolic function was vigorous. The estimated ejection fraction was in the range of 65% to 70%. Wall motion was normal; there were no regional wall motion abnormalities. Doppler parameters are consistent with abnormal left ventricular relaxation (grade 1 diastolic dysfunction). There was no evidence of elevated ventricular filling pressure by Doppler parameters. - Aortic valve: Trileaflet; mildly thickened, mildly calcified leaflets. Transvalvular velocity was within the normal range. There was no stenosis. There was no regurgitation. - Aortic root: The aortic root was normal in size. - Mitral valve: Calcified annulus. There was mild regurgitation. - Left atrium: The atrium was normal in size. - Right ventricle: Systolic function was normal. - Right atrium: The atrium was normal in size. - Pulmonic valve: There was no regurgitation. - Pulmonary arteries: Systolic pressure was within the normal range. - Inferior vena cava: The vessel was normal in size. - Pericardium, extracardiac: A trivial pericardial effusion was identified posterior to the heart. Features were not consistent with  tamponade physiology.  Patient Profile     63 y.o. female with a hx of DM2, HTN, PAF, ESRD on HD, OSA, obesity, chest pain s/p MV 2011, Myoview 04/2017 w/out ischemia, who is being seen for evaluation of the chest pain and abnormal EKG  Assessment & Plan    Chest pain/Pericarditis ? Patient presented with pleuritic chest pain.Troponin peaked at 12 . ECG with diffuse ST elevation. CT negative for dissection or aneurysm.  - Patient was started on Ibuprofen 600 mg TID  - Echo results pending - Chest pain is greatly improved today. She denies chest pain while walking.  - Hgb dropped to 8.8 >> A/c on hold - In order to lower bleeding risk was switched to colchicine>> discussed with pharmacy  Acute Anemia - Hgb 14 > 8.8 - Will hold eliquis and aspirin - hemoccult ordered - Unsure why patient is on ASA 81 mg twice daily. Appears to have been taking this dose in 2019 ? Will discuss continuation of ASA since patient is high risk for bleeding   Paroxysmal afib - Appears to be in NSR - Eliquis for chronic a/c >>Will hold eliquis for acute anemia  ESRD on HS - Underwent HD yesterday and received 1 L Ns for hypotension >> monitor fluid status - often requiring midodrine for hypotension -  per IM  Type 2 DM - A1C 4.9 - Diet controlled  Hyperlipidemia - Lipitor 40 mg daily - Will check lipid panel  For questions or updates, please contact [facility] Please consult [email or web address] for contact info under        Signed, Cadence Ninfa Meeker, PA-C  08/26/2019, 8:17 AM    I have examined the patient and reviewed assessment and plan and discussed with patient.  Agree with above as stated.  I personally reviewed her echo images.  SHe has a small pericardial effusion with normal LV function.  Dilated right ventricle.   Low Hbg this morning appears false.  COntinue ELiquis. Stop aspirin given that she is on Eliquis. Temporary ibuprofen for pericarditis.  Will add protonix for now to reduce GI  bleeding risk.  She will need HD tomorrow for her ESRD.  Larae Grooms

## 2019-08-27 ENCOUNTER — Encounter (HOSPITAL_COMMUNITY): Payer: Self-pay | Admitting: General Practice

## 2019-08-27 DIAGNOSIS — I959 Hypotension, unspecified: Secondary | ICD-10-CM

## 2019-08-27 DIAGNOSIS — I319 Disease of pericardium, unspecified: Secondary | ICD-10-CM

## 2019-08-27 LAB — CBC WITH DIFFERENTIAL/PLATELET
Abs Immature Granulocytes: 0.03 10*3/uL (ref 0.00–0.07)
Basophils Absolute: 0 10*3/uL (ref 0.0–0.1)
Basophils Relative: 0 %
Eosinophils Absolute: 0.2 10*3/uL (ref 0.0–0.5)
Eosinophils Relative: 3 %
HCT: 34.1 % — ABNORMAL LOW (ref 36.0–46.0)
Hemoglobin: 11 g/dL — ABNORMAL LOW (ref 12.0–15.0)
Immature Granulocytes: 0 %
Lymphocytes Relative: 19 %
Lymphs Abs: 1.5 10*3/uL (ref 0.7–4.0)
MCH: 32.3 pg (ref 26.0–34.0)
MCHC: 32.3 g/dL (ref 30.0–36.0)
MCV: 100 fL (ref 80.0–100.0)
Monocytes Absolute: 1 10*3/uL (ref 0.1–1.0)
Monocytes Relative: 12 %
Neutro Abs: 5.4 10*3/uL (ref 1.7–7.7)
Neutrophils Relative %: 66 %
Platelets: 202 10*3/uL (ref 150–400)
RBC: 3.41 MIL/uL — ABNORMAL LOW (ref 3.87–5.11)
RDW: 15.6 % — ABNORMAL HIGH (ref 11.5–15.5)
WBC: 8.2 10*3/uL (ref 4.0–10.5)
nRBC: 0 % (ref 0.0–0.2)

## 2019-08-27 LAB — RENAL FUNCTION PANEL
Albumin: 2.9 g/dL — ABNORMAL LOW (ref 3.5–5.0)
Anion gap: 12 (ref 5–15)
BUN: 66 mg/dL — ABNORMAL HIGH (ref 8–23)
CO2: 28 mmol/L (ref 22–32)
Calcium: 7.9 mg/dL — ABNORMAL LOW (ref 8.9–10.3)
Chloride: 96 mmol/L — ABNORMAL LOW (ref 98–111)
Creatinine, Ser: 11.84 mg/dL — ABNORMAL HIGH (ref 0.44–1.00)
GFR calc Af Amer: 4 mL/min — ABNORMAL LOW (ref >60)
GFR calc non Af Amer: 3 mL/min — ABNORMAL LOW (ref >60)
Glucose, Bld: 74 mg/dL (ref 70–99)
Phosphorus: 3.3 mg/dL (ref 2.5–4.6)
Potassium: 4.5 mmol/L (ref 3.5–5.1)
Sodium: 136 mmol/L (ref 135–145)

## 2019-08-27 LAB — LIPID PANEL
Cholesterol: 175 mg/dL (ref 0–200)
HDL: 43 mg/dL (ref >40)
LDL Cholesterol: 115 mg/dL — ABNORMAL HIGH (ref 0–99)
Total CHOL/HDL Ratio: 4.1 RATIO
Triglycerides: 83 mg/dL (ref <150)
VLDL: 17 mg/dL (ref 0–40)

## 2019-08-27 LAB — GLUCOSE, CAPILLARY
Glucose-Capillary: 138 mg/dL — ABNORMAL HIGH (ref 70–99)
Glucose-Capillary: 135 mg/dL — ABNORMAL HIGH (ref 70–99)
Glucose-Capillary: 83 mg/dL (ref 70–99)

## 2019-08-27 MED ORDER — SODIUM CHLORIDE 0.9 % IV SOLN: 100.0000 mL | INTRAVENOUS | Status: DC | PRN

## 2019-08-27 MED ORDER — LIDOCAINE HCL (PF) 1 % IJ SOLN: 5.0000 mL | INTRAMUSCULAR | Status: DC | PRN

## 2019-08-27 MED ORDER — AMIODARONE HCL IN DEXTROSE 360-4.14 MG/200ML-% IV SOLN
60.0000 mg/h | INTRAVENOUS | Status: DC
  Administered 2019-08-27 (×2): 60 mg/h via INTRAVENOUS

## 2019-08-27 MED ORDER — HYDROMORPHONE HCL 1 MG/ML IJ SOLN
0.5000 mg | Freq: Four times a day (QID) | INTRAMUSCULAR | Status: DC | PRN
  Administered 2019-08-29 – 2019-09-02 (×8): 1 mg via INTRAVENOUS
  Filled 2019-08-27 (×10): qty 1

## 2019-08-27 MED ORDER — LIDOCAINE-PRILOCAINE 2.5-2.5 % EX CREA: 1.0000 "application " | TOPICAL_CREAM | CUTANEOUS | Status: DC | PRN

## 2019-08-27 MED ORDER — MIDODRINE HCL 5 MG PO TABS
10.0000 mg | ORAL_TABLET | Freq: Three times a day (TID) | ORAL | Status: DC
  Administered 2019-08-27 – 2019-09-09 (×34): 10 mg via ORAL
  Filled 2019-08-27 (×33): qty 2

## 2019-08-27 MED ORDER — ALBUMIN HUMAN 25 % IV SOLN
INTRAVENOUS | Status: AC
  Filled 2019-08-27: qty 50

## 2019-08-27 MED ORDER — HEPARIN SODIUM (PORCINE) 1000 UNIT/ML DIALYSIS
100.0000 [IU]/kg | INTRAMUSCULAR | Status: DC | PRN
  Administered 2019-08-27: 10000 [IU] via INTRAVENOUS_CENTRAL
  Filled 2019-08-27: qty 10

## 2019-08-27 MED ORDER — CALCITRIOL 0.25 MCG PO CAPS
ORAL_CAPSULE | ORAL | Status: AC
  Filled 2019-08-27: qty 3

## 2019-08-27 MED ORDER — HEPARIN SODIUM (PORCINE) 1000 UNIT/ML DIALYSIS: 1000.0000 [IU] | INTRAMUSCULAR | Status: DC | PRN

## 2019-08-27 MED ORDER — MIDODRINE HCL 5 MG PO TABS
10.0000 mg | ORAL_TABLET | Freq: Three times a day (TID) | ORAL | Status: DC
  Administered 2019-08-27: 10 mg via ORAL

## 2019-08-27 MED ORDER — MIDODRINE HCL 5 MG PO TABS: 10.0000 mg | ORAL_TABLET | Freq: Once | ORAL | Status: DC

## 2019-08-27 MED ORDER — ALTEPLASE 2 MG IJ SOLR: 2.0000 mg | Freq: Once | INTRAMUSCULAR | Status: DC | PRN

## 2019-08-27 MED ORDER — MIDODRINE HCL 5 MG PO TABS
ORAL_TABLET | ORAL | Status: AC
  Filled 2019-08-27: qty 2

## 2019-08-27 MED ORDER — ALBUMIN HUMAN 25 % IV SOLN
12.5000 g | Freq: Once | INTRAVENOUS | Status: AC
  Administered 2019-08-27: 12.5 g via INTRAVENOUS

## 2019-08-27 MED ORDER — SODIUM CHLORIDE 0.9 % IV BOLUS
250.0000 mL | Freq: Once | INTRAVENOUS | Status: AC
  Administered 2019-08-27: 250 mL via INTRAVENOUS

## 2019-08-27 MED ORDER — MORPHINE SULFATE (PF) 2 MG/ML IV SOLN
INTRAVENOUS | Status: AC
  Filled 2019-08-27: qty 1

## 2019-08-27 MED ORDER — HYDROMORPHONE HCL 1 MG/ML IJ SOLN
0.5000 mg | Freq: Once | INTRAMUSCULAR | Status: AC
  Administered 2019-08-27: 0.5 mg via INTRAVENOUS
  Filled 2019-08-27: qty 1

## 2019-08-27 MED ORDER — HEPARIN SODIUM (PORCINE) 1000 UNIT/ML IJ SOLN
INTRAMUSCULAR | Status: AC
  Filled 2019-08-27: qty 10

## 2019-08-27 MED ORDER — APIXABAN 5 MG PO TABS
5.0000 mg | ORAL_TABLET | Freq: Two times a day (BID) | ORAL | Status: DC
  Administered 2019-08-27 – 2019-08-30 (×8): 5 mg via ORAL
  Filled 2019-08-27 (×9): qty 1

## 2019-08-27 MED ORDER — AMIODARONE HCL IN DEXTROSE 360-4.14 MG/200ML-% IV SOLN
INTRAVENOUS | Status: AC
  Administered 2019-08-27: 60 mg/h via INTRAVENOUS
  Filled 2019-08-27: qty 200

## 2019-08-27 MED ORDER — PENTAFLUOROPROP-TETRAFLUOROETH EX AERO: 1.0000 "application " | INHALATION_SPRAY | CUTANEOUS | Status: DC | PRN

## 2019-08-27 MED ORDER — AMIODARONE HCL IN DEXTROSE 360-4.14 MG/200ML-% IV SOLN
30.0000 mg/h | INTRAVENOUS | Status: DC
  Administered 2019-08-27 – 2019-08-28 (×2): 30 mg/h via INTRAVENOUS
  Filled 2019-08-27 (×2): qty 200

## 2019-08-27 MED ORDER — AMIODARONE LOAD VIA INFUSION
150.0000 mg | Freq: Once | INTRAVENOUS | Status: AC
  Administered 2019-08-27: 150 mg via INTRAVENOUS
  Filled 2019-08-27: qty 83.34

## 2019-08-27 NOTE — Progress Notes (Signed)
tx stopped with 1hr 56mins left; Pt c/o chest pain; HR sustaining 140-160's; Dr. Carolin Sicks, Nephrology made aware;  he ordered to stop the treatment; Rapid response called and notified to come and assess the patient. Pt accompanied to her room by Rapid Response RN and Hemodialysis RN.

## 2019-08-27 NOTE — Consult Note (Addendum)
NAME:  Brenda Arnold, MRN:  [id number], DOB:  11-19-1956, LOS: 1 ADMISSION DATE:  08/25/2019, CONSULTATION DATE:  08/27/19 REFERRING MD:  Joseph Berkshire, CHIEF COMPLAINT:  Hypotension   Brief History   Brenda Arnold is a 63 yo F w/ PMH of ESRD on HD, PAF presented w/ chest pain. PCCM consulted for hypotension after dialysis.   History of present illness   Brenda Arnold is a 63 yo F w/ PMH of ESRD on HD, OSA, DM2, HTN, PAF who presented w/ A.fib w/ RVR and atypical chest pain. She states she was in her usual state of health until 9/22 when she presented to her PCP's office with chest pain. She was told by PCP to go to [facility] due to A.fib w/ RVR. She states that she takes midodrine at home for hypotension and takes it 3 times a day and her blood pressure has been stable at home, with intermittent hypotension on dialysis days, although she mentions not checking her bp regularly.  During this admission, she received amiodarone for her A.fib and despite her rate improving, she has been getting intermittent chest pain. This morning she was found to be tachycardic w/ chest pain requiring early cessation of her dialysis session. At this time she received dilaudid in addition to morphine given earlier this morning. Shortly after she had worsening hypotension with bp down to 0000000 systolic. She received small boluses of fluid with mild improvement in her bp. PCCM was consulted for hypotension.  Past Medical History  ESRD OSA DM2 HTN PAF Obesity  Significant Hospital Events   9/25 HD session cut short due to hypotension  Consults:  Cardiology Nephrology  Procedures:  N/A  Significant Diagnostic Tests:  9/22 CTA chest/abd/pelvis: no acute findings  Micro Data:  9/22 COVID neg  Antimicrobials:  N/A  Interim history/subjective:  See above for interim hx  Brenda Arnold was examined and evaluated at bedside this PM. She was observed eating lunch without difficulty. She mentions that she had very bad  chest pain earlier today but otherwise feels fine. Currently denies any light-headedness, dizziness or weakness. She mentions her blood pressure usually runs low and she has been taking midodrine at home.  Objective   Blood pressure (!) 95/45, pulse 93, temperature 98.2 F (36.8 C), temperature source Oral, resp. rate 19, height 5\' 6"  (1.676 m), weight 102 kg, SpO2 100 %.    FiO2 (%):  [55 %] 55 %   Intake/Output Summary (Last 24 hours) at 08/27/2019 1319 Last data filed at 08/27/2019 1009 Gross per 24 hour  Intake 240 ml  Output 318 ml  Net -78 ml   Filed Weights   08/26/19 0655 08/27/19 0540 08/27/19 0720  Weight: 100.4 kg 100.7 kg 102 kg   Examination: Gen: Well-developed, well nourished, NAD HEENT: NCAT head, hearing intact, EOMI, MMM Neck: supple, ROM intact, no JVD CV: RRR, S1, S2 normal, No rubs, no murmurs Pulm: CTAB, No rales, no wheezes Abd: Soft, BS+, NTND, No rebound, no guarding Extm: ROM intact, Peripheral pulses intact, No peripheral edema, LUE AV fistula w/ good bruit. BP cuff on ankle. Skin: Dry, Warm, normal turgor Neuro: AAOx3  Resolved Hospital Problem list   N/A  Assessment & Plan:   Hypotension Likely due to combination of volume off @ dialysis + morphine in ESRD patient. Clinical picture does not correlate w/ BP reading. At time of exam systolic BP [id number]. Would treat based on systolic Bp rather than MAP. - Minimize opioid medication - Consider changing BP cuff site  for more accurate reading - C/w midodrine 10mg  TID - Gentle fluid resuscitation PRN - Not indicated for ICU transfer  Atypical Chest Pain Hs-trop peak at 12, EKG w/ diffuse ST elevation consistent w/ pericarditis. Started on colchicine per cards. Trivial pericardial effusion on Echo - C/w colchicine - Minimize opioid use  Afib w/ RVR Hx of PAF on eliquis. Started on amiodarone with rate control. Rate up to 160-190 in HD but currently in 80s. Cardiology on board - Amiodarone - Cardiology  to manage  ESRD K 4.5, Creatinine 11.84, Does not appear hypervolemic on exam - HD per nephro   Best practice:  Diet: Renal/Carb modified Pain/Anxiety/Delirium protocol (if indicated): dilaudid VAP protocol (if indicated): N/A DVT prophylaxis: eliquis GI prophylaxis: N/A Glucose control: SSI Mobility: Up w/ assistance Code Status: Full Family Communication: N/A Disposition: Telemetry  Labs   CBC: Recent Labs  Lab 08/24/19 1747 08/24/19 1802 08/25/19 0931 08/26/19 0348 08/26/19 0859 08/27/19 0831  WBC 8.9  --  9.9 9.4 9.1 8.2  NEUTROABS 6.5  --   --   --   --  5.4  HGB 13.1 14.6 14.0 8.8* 12.3 11.0*  HCT 42.3 43.0 43.5 28.3* 37.2 34.1*  MCV 103.4*  --  101.6* 103.3* 99.5 100.0  PLT 147*  --  146* 152 154 [id number]    Basic Metabolic Panel: Recent Labs  Lab 08/24/19 1747 08/24/19 1802 08/25/19 0931 08/26/19 0348 08/27/19 0830  NA 137 137 135 135 136  K 4.6 4.4 4.6 4.6 4.5  CL 93* 95* 91* 96* 96*  CO2 29  --  27 25 28   GLUCOSE 93 86 80 72 74  BUN 40* 42* 24* 40* 66*  CREATININE 9.73* 9.90* 6.58* 8.74* 11.84*  CALCIUM 8.0*  --  8.1* 7.3* 7.9*  PHOS  --   --   --   --  3.3   GFR: Estimated Creatinine Clearance: 5.9 mL/min (A) (by C-G formula based on SCr of 11.84 mg/dL (H)). Recent Labs  Lab 08/25/19 0931 08/25/19 1012 08/26/19 0348 08/26/19 0859 08/27/19 0831  WBC 9.9  --  9.4 9.1 8.2  LATICACIDVEN  --  1.6  --   --   --     Liver Function Tests: Recent Labs  Lab 08/27/19 0830  ALBUMIN 2.9*   No results for input(s): LIPASE, AMYLASE in the last 168 hours. No results for input(s): AMMONIA in the last 168 hours.  ABG    Component Value Date/Time   TCO2 37 (H) 08/24/2019 1802     Coagulation Profile: No results for input(s): INR, PROTIME in the last 168 hours.  Cardiac Enzymes: No results for input(s): CKTOTAL, CKMB, CKMBINDEX, TROPONINI in the last 168 hours.  HbA1C: Hgb A1c MFr Bld  Date/Time Value Ref Range Status  07/21/2018 04:35 PM  4.9 <5.7 % of total Hgb Final    Comment:    For the purpose of screening for the presence of diabetes: . <5.7%       Consistent with the absence of diabetes 5.7-6.4%    Consistent with increased risk for diabetes             (prediabetes) > or =6.5%  Consistent with diabetes . This assay result is consistent with a decreased risk of diabetes. . Currently, no consensus exists regarding use of hemoglobin A1c for diagnosis of diabetes in children. . According to American Diabetes Association (ADA) guidelines, hemoglobin A1c <7.0% represents optimal control in non-pregnant diabetic patients. Different metrics may apply to  specific patient populations.  Standards of Medical Care in Diabetes(ADA). Marland Kitchen   04/14/2018 02:54 PM 4.7 <5.7 % of total Hgb Final    Comment:    For the purpose of screening for the presence of diabetes: . <5.7%       Consistent with the absence of diabetes 5.7-6.4%    Consistent with increased risk for diabetes             (prediabetes) > or =6.5%  Consistent with diabetes . This assay result is consistent with a decreased risk of diabetes. . Currently, no consensus exists regarding use of hemoglobin A1c for diagnosis of diabetes in children. . According to American Diabetes Association (ADA) guidelines, hemoglobin A1c <7.0% represents optimal control in non-pregnant diabetic patients. Different metrics may apply to specific patient populations.  Standards of Medical Care in Diabetes(ADA). .     CBG: Recent Labs  Lab 08/26/19 0802 08/26/19 1130 08/26/19 1607 08/26/19 2111 08/27/19 1122  GLUCAP 98 69* 89 149* 138*    Review of Systems:   Review of Systems  Constitutional: Negative for chills, fever and malaise/fatigue.  Respiratory: Negative for shortness of breath.   Cardiovascular: Positive for chest pain. Negative for palpitations and leg swelling.  Gastrointestinal: Negative for constipation, diarrhea, nausea and vomiting.   Neurological: Negative for weakness and headaches.  All other systems reviewed and are negative.  Past Medical History  She,  has a past medical history of Anemia, Arthritis, Asthma, Complication of anesthesia, Diabetes mellitus, Dyslipidemia, Epistaxis (11/05/2012), ESRD (end stage renal disease) on dialysis [facility]), History of nuclear stress test, HTN (hypertension), Hyperlipidemia, Hyperparathyroidism due to renal insufficiency ([place]), Obesity, Paroxysmal A-fib ([place]), PCOS (polycystic ovarian syndrome), Pneumonia, Seasonal allergies, Sleep apnea, and Vitamin D deficiency.   Surgical History    Past Surgical History:  Procedure Laterality Date  . ABDOMINAL HYSTERECTOMY     with panniculctomy  . AV FISTULA PLACEMENT  09/02/2012   Procedure: ARTERIOVENOUS (AV) FISTULA CREATION;  Surgeon: Elam Dutch, MD;  Location: [facility] OR;  Service: Vascular;  Laterality: Left;  Creation of Left Radial-Cephalic Fistula   . BREAST SURGERY     Biopsy right breast  . COLONOSCOPY W/ BIOPSIES AND POLYPECTOMY    . DILATION AND CURETTAGE OF UTERUS    . KNEE ARTHROSCOPY Right   . REVISON OF ARTERIOVENOUS FISTULA Left 04/27/2014   Procedure: REVISON OF LEFT RADIAL-CEPHALIC ARTERIOVENOUS FISTULA;  Surgeon: Mal Misty, MD;  Location: [place];  Service: Vascular;  Laterality: Left;  . TOTAL HIP ARTHROPLASTY Right 09/15/2018  . TOTAL HIP ARTHROPLASTY Right 09/15/2018   Procedure: RIGHT TOTAL HIP ARTHROPLASTY ANTERIOR APPROACH;  Surgeon: Leandrew Koyanagi, MD;  Location: [place];  Service: Orthopedics;  Laterality: Right;  . UVULOPLASTY       Social History   reports that she has never smoked. She has never used smokeless tobacco. She reports that she does not drink alcohol or use drugs.   Family History   Her family history includes Diabetes in her brother; Hypertension in her mother; Kidney disease in her brother; Lung cancer in her father.   Allergies Allergies  Allergen Reactions  . Iodine Shortness Of Breath   . Shellfish Allergy Shortness Of Breath  . Norvasc [Amlodipine] Swelling    SWELLING REACTION UNSPECIFIED      Home Medications  Prior to Admission medications   Medication Sig Start Date End Date Taking? Authorizing Provider  aspirin EC 81 MG tablet Take 1 tablet (81 mg total) by mouth  2 (two) times daily. Patient not taking: Reported on 08/25/2019 09/15/18   Leandrew Koyanagi, MD  atorvastatin (LIPITOR) 40 MG tablet Take 1 tablet (40 mg total) by mouth daily at 6 PM. Patient not taking: Reported on 08/25/2019 11/09/18   Georgette Shell, MD  bisacodyl (DULCOLAX) 10 MG suppository Place 10 mg rectally as needed for moderate constipation.    [provider]  calcitRIOL (ROCALTROL) 0.5 MCG capsule Take 3 capsules (1.5 mcg total) by mouth 3 (three) times a week. Patient not taking: Reported on 08/25/2019 11/11/18   Georgette Shell, MD  ELIQUIS 5 MG TABS tablet Take 5 mg by mouth 2 (two) times daily.  12/19/18   [provider]  ethyl chloride spray Apply 1 application topically See admin instructions. For dialysis 06/25/18   [provider]  gabapentin (NEURONTIN) 100 MG capsule Take 100 mg by mouth at bedtime. 05/14/17   [provider]  methocarbamol (ROBAXIN) 500 MG tablet Take 1 tablet (500 mg total) by mouth every 6 (six) hours as needed for muscle spasms. Patient not taking: Reported on 08/25/2019 09/15/18   Leandrew Koyanagi, MD  ondansetron (ZOFRAN) 4 MG tablet Take 1-2 tablets (4-8 mg total) by mouth every 8 (eight) hours as needed for nausea or vomiting. Patient not taking: Reported on 08/25/2019 09/15/18   Leandrew Koyanagi, MD  oxyCODONE (OXY IR/ROXICODONE) 5 MG immediate release tablet Take 1-3 tablets (5-15 mg total) by mouth every 4 (four) hours as needed. Patient not taking: Reported on 08/25/2019 09/15/18   Leandrew Koyanagi, MD  polycarbophil (FIBERCON) 625 MG tablet Take 625 mg by mouth daily.    [provider]  promethazine (PHENERGAN) 25 MG  tablet Take 1 tablet (25 mg total) by mouth every 6 (six) hours as needed for nausea. Patient not taking: Reported on 08/25/2019 09/15/18   Leandrew Koyanagi, MD  senna-docusate (SENOKOT S) 8.6-50 MG tablet Take 1 tablet by mouth at bedtime as needed. Patient not taking: Reported on 08/25/2019 09/15/18   Leandrew Koyanagi, MD  sucroferric oxyhydroxide (VELPHORO) 500 MG chewable tablet Chew 2 capsules by mouth 4 (four) times daily -  with meals and at bedtime.     [provider]     Critical care time:     Gilberto Better, MD PGY-2, [place] [facility] Pager: [phone]  Attending attestation to follow

## 2019-08-27 NOTE — Progress Notes (Signed)
Patient is having frequent runs of SVT with HR in 150-170's. She also had 7 beats NSVT. Patient in bed with eyes closed. On call Lamar Blinks, NP made aware.  Will continue to monitor pt.

## 2019-08-27 NOTE — Plan of Care (Signed)
  Problem: Cardiac: Goal: Ability to achieve and maintain adequate cardiovascular perfusion will improve Outcome: Progressing   

## 2019-08-27 NOTE — Progress Notes (Signed)
PROGRESS NOTE    Brenda Arnold  [id number] DOB: 07-03-1956 DOA: 08/25/2019 PCP: Kelton Pillar, MD    Brief Narrative:   63 year old lady with prior h/o type 2 DM, hypotension, on midodrine, ESRD on HD, CAD, OSA presents to ED FOR chest pain.   Assessment & Plan:   Principal Problem:   Chest pain Active Problems:   Type 2 diabetes mellitus with complication, without long-term current use of insulin (HCC)   OBESITY   OBESITY-MORBID (>100')   OSA (obstructive sleep apnea)   Hypotension   ESRD on hemodialysis (HCC)   CAD (coronary artery disease)   Tachycardia   Pericarditis     Chest pain possibly from ? Pericarditis vs musculoskeletal. :  On colchicine on the days of HD.  If pain reoccurs, she will probably need steroids.    Paroxysmal atrial fibrillation with RVR;  Rate control with IV amiodarone.  Appreciate cardiology input.  Echocardiogram shows Left ventricular ejection fraction, by visual estimation, is 60 to 65%. The left ventricle has normal function. Normal left ventricular size. There is mildly increased left ventricular hypertrophy. Left ventricular diastolic Doppler parameters are consistent with impaired relaxation pattern of LV diastolic filling  On eliquis for anti coagulation.    ESRD on HD; Today session was terminated due to afib with RVR and acute chest pain.    Hypotension:  Unclear etiology. ? Due to dilaudid and morphine given back to back within one hour, in addition to HD, . Pt totally asymptomatic, was eating, talking and following commands.  250 bolus given and her pressures improved to 90/50's . Increased the dose of Midodrine to 10 mg TID.    Anemia of chronic disease:  Hemoglobin stable around 11.   Diabetes Mellitus:  CBG (last 3)  Recent Labs    08/26/19 2111 08/27/19 1122 08/27/19 1625  GLUCAP 149* 138* 135*   Resume SSI. cbgs are well controlled.     Hyperlipidemia:  Resume statin.    DVT prophylaxis:  eliquis.  Code Status: full code Family Communication: none at bedside.  Disposition Plan: pending clinical improvement.    Consultants:   Cardiology Dr Irish Lack   Nephrology Dr Carolin Sicks  PCCM Dr Ander Slade.    Procedures: echocardiogram.   Antimicrobials: none.   Subjective: Chest pain completely resolved after the dilaudid and morphine.  No nausea or vomiting or abdominal pain.   Objective: Vitals:   08/27/19 1051 08/27/19 1100 08/27/19 1110 08/27/19 1221  BP: (!) 190/164  105/87 (!) 95/45  Pulse: (!) 120 (!) 144 95 93  Resp:   19   Temp:    98.2 F (36.8 C)  TempSrc:    Oral  SpO2: 100%  100% 100%  Weight:      Height:        Intake/Output Summary (Last 24 hours) at 08/27/2019 1300 Last data filed at 08/27/2019 1009 Gross per 24 hour  Intake 240 ml  Output 318 ml  Net -78 ml   Filed Weights   08/26/19 0655 08/27/19 0540 08/27/19 0720  Weight: 100.4 kg 100.7 kg 102 kg    Examination:  General exam: lethargic, not in distress , answering all questions appropriately and following commands.  Respiratory system: Clear to auscultation. Respiratory effort normal. Cardiovascular system: S1 & S2 heard, irregularly irregular, tachycardic.  No JVD,  No pedal edema. Gastrointestinal system: Abdomen is nondistended, soft and nontender.   Normal bowel sounds heard. Central nervous system: lethargic, but able to answer all questions appropriately.  And move all extremities.  Extremities: no pedal edema or cyanosis.  Skin: No rashes, lesions or ulcers Psychiatry:  Flat affect probably from the medications.     Data Reviewed: I have personally reviewed following labs and imaging studies  CBC: Recent Labs  Lab 08/24/19 1747 08/24/19 1802 08/25/19 0931 08/26/19 0348 08/26/19 0859 08/27/19 0831  WBC 8.9  --  9.9 9.4 9.1 8.2  NEUTROABS 6.5  --   --   --   --  5.4  HGB 13.1 14.6 14.0 8.8* 12.3 11.0*  HCT 42.3 43.0 43.5 28.3* 37.2 34.1*  MCV 103.4*  --  101.6*  103.3* 99.5 100.0  PLT 147*  --  146* 152 154 [id number]   Basic Metabolic Panel: Recent Labs  Lab 08/24/19 1747 08/24/19 1802 08/25/19 0931 08/26/19 0348 08/27/19 0830  NA 137 137 135 135 136  K 4.6 4.4 4.6 4.6 4.5  CL 93* 95* 91* 96* 96*  CO2 29  --  27 25 28   GLUCOSE 93 86 80 72 74  BUN 40* 42* 24* 40* 66*  CREATININE 9.73* 9.90* 6.58* 8.74* 11.84*  CALCIUM 8.0*  --  8.1* 7.3* 7.9*  PHOS  --   --   --   --  3.3   GFR: Estimated Creatinine Clearance: 5.9 mL/min (A) (by C-G formula based on SCr of 11.84 mg/dL (H)). Liver Function Tests: Recent Labs  Lab 08/27/19 0830  ALBUMIN 2.9*   No results for input(s): LIPASE, AMYLASE in the last 168 hours. No results for input(s): AMMONIA in the last 168 hours. Coagulation Profile: No results for input(s): INR, PROTIME in the last 168 hours. Cardiac Enzymes: No results for input(s): CKTOTAL, CKMB, CKMBINDEX, TROPONINI in the last 168 hours. BNP (last 3 results) No results for input(s): PROBNP in the last 8760 hours. HbA1C: No results for input(s): HGBA1C in the last 72 hours. CBG: Recent Labs  Lab 08/26/19 0802 08/26/19 1130 08/26/19 1607 08/26/19 2111 08/27/19 1122  GLUCAP 98 69* 89 149* 138*   Lipid Profile: Recent Labs    08/27/19 0353  CHOL 175  HDL 43  LDLCALC 115*  TRIG 83  CHOLHDL 4.1   Thyroid Function Tests: No results for input(s): TSH, T4TOTAL, FREET4, T3FREE, THYROIDAB in the last 72 hours. Anemia Panel: No results for input(s): VITAMINB12, FOLATE, FERRITIN, TIBC, IRON, RETICCTPCT in the last 72 hours. Sepsis Labs: Recent Labs  Lab 08/25/19 1012  LATICACIDVEN 1.6    Recent Results (from the past 240 hour(s))  SARS CORONAVIRUS 2 (TAT 6-24 HRS) Nasopharyngeal Nasopharyngeal Swab     Status: None   Collection Time: 08/24/19  5:47 PM   Specimen: Nasopharyngeal Swab  Result Value Ref Range Status   SARS Coronavirus 2 NEGATIVE NEGATIVE Final    Comment: (NOTE) SARS-CoV-2 target nucleic acids are NOT  DETECTED. The SARS-CoV-2 RNA is generally detectable in upper and lower respiratory specimens during the acute phase of infection. Negative results do not preclude SARS-CoV-2 infection, do not rule out co-infections with other pathogens, and should not be used as the sole basis for treatment or other patient management decisions. Negative results must be combined with clinical observations, patient history, and epidemiological information. The expected result is Negative. Fact Sheet for Patients: SugarRoll.be Fact Sheet for Healthcare Providers: https://www.woods-mathews.com/ This test is not yet approved or cleared by the [place] FDA and  has been authorized for detection and/or diagnosis of SARS-CoV-2 by FDA under an Emergency Use Authorization (EUA). This EUA will remain  in  effect (meaning this test can be used) for the duration of the COVID-19 declaration under Section 56 4(b)(1) of the Act, 21 U.S.C. section 360bbb-3(b)(1), unless the authorization is terminated or revoked sooner. Performed at [facility], [place] [address] [place], [place] [address]   SARS CORONAVIRUS 2 (TAT 6-24 HRS) Nasopharyngeal Nasopharyngeal Swab     Status: None   Collection Time: 08/25/19 12:22 PM   Specimen: Nasopharyngeal Swab  Result Value Ref Range Status   SARS Coronavirus 2 NEGATIVE NEGATIVE Final    Comment: (NOTE) SARS-CoV-2 target nucleic acids are NOT DETECTED. The SARS-CoV-2 RNA is generally detectable in upper and lower respiratory specimens during the acute phase of infection. Negative results do not preclude SARS-CoV-2 infection, do not rule out co-infections with other pathogens, and should not be used as the sole basis for treatment or other patient management decisions. Negative results must be combined with clinical observations, patient history, and epidemiological information. The expected result is Negative. Fact Sheet for  Patients: SugarRoll.be Fact Sheet for Healthcare Providers: https://www.woods-mathews.com/ This test is not yet approved or cleared by the [place] FDA and  has been authorized for detection and/or diagnosis of SARS-CoV-2 by FDA under an Emergency Use Authorization (EUA). This EUA will remain  in effect (meaning this test can be used) for the duration of the COVID-19 declaration under Section 56 4(b)(1) of the Act, 21 U.S.C. section 360bbb-3(b)(1), unless the authorization is terminated or revoked sooner. Performed at [facility], [place] [address] [place], [place] [address]          Radiology Studies: No results found.      Scheduled Meds: . apixaban  5 mg Oral BID  . atorvastatin  40 mg Oral q1800  . calcitRIOL      . calcitRIOL  0.75 mcg Oral Q M,W,F-HD  . colchicine  0.3 mg Oral Q T,Th,Sa-HD  . gabapentin  100 mg Oral QHS  . heparin      . midodrine  10 mg Oral Once in dialysis  . midodrine  2.5 mg Oral TID WC  . morphine      . multivitamin  1 tablet Oral QHS  . sucroferric oxyhydroxide  1,000 mg Oral TID WC & HS   Continuous Infusions: . amiodarone 60 mg/hr (08/27/19 1054)   Followed by  . amiodarone    . sodium chloride Stopped (08/25/19 1705)     LOS: 1 day        Hosie Poisson, MD [facility] Pager [phone]  If 7PM-7AM, please contact night-coverage [email or web address] Password [email or web address] 08/27/2019, 1:00 PM

## 2019-08-27 NOTE — Significant Event (Signed)
Rapid Response Event Note  Overview: Time Called: 1019 Arrival Time: 1021 Event Type: Cardiac  Initial Focused Assessment: Patient in HD, treatment ended early because patient complaining of CP and HR 140-160. Upon my arrival She is in RAF rate 160-190 She is restless and complaining of CP and her feet itching and feeling numb.  She kept saying "this is what I feel like when my HR is fast".  BP 125/104 AF 182  RR 20 O2 sat 100% on [place]  Interventions: Placed on 55% venturi because of pt distress and complaint of CP with HR 160-190.  No decrease of O2 sat.  0.5mg  Dilaudid given IV (received 1mg  Morphine at 0946)  Some relief and more calm after diludid given  150 mg Amiodarone bolus given via bag Amiodarone gtt started Converted back to SR at 1109  BP 105/87  SR 95  RR 19  O2 sat 100%  Dr Irish Lack at bedside when pt converted from RAF to [place] (if not transferred):  Event Summary: Name of Physician Notified: Dr Carolin Sicks notified by HD RN at    Name of Consulting Physician Notified: Dr Irish Lack at 1030  Outcome: Stayed in room and stabalized  Event End Time: [place]  Raliegh Ip

## 2019-08-27 NOTE — Progress Notes (Addendum)
[facility] Progress Note   Subjective:   Patient seen in room. HD complicated this AM due to hypotension. Given midodrine and albumin with improvement in BP, however treatment eventually discontinued today with 1hr 20min left due to tachycardia/CP (see prior notes). HR up to 170s. Seen by cardiology, given dilaudid. HR 90's-105 at time of exam. BP initially 150's but dropped to 70's and patient reported dizziness. Reports her chest pain has resolved. Dr. Carolin Sicks discussed with primary team and plan for 250 cc bolus. Nurse also following up with cardiology. Pt concerned that she has excess fluid-  Has not had a full treatment since Monday and is 3kg above her EDW. No SOB, dyspnea, cough, edema, N/V/D.   Objective Vitals:   08/27/19 1051 08/27/19 1100 08/27/19 1110 08/27/19 1221  BP: (!) 190/164  105/87 (!) 95/45  Pulse: (!) 120 (!) 144 95 93  Resp:   19   Temp:    98.2 F (36.8 C)  TempSrc:    Oral  SpO2: 100%  100% 100%  Weight:      Height:       Physical Exam General: Well developed, alert female in NAD.  Heart:RRR, no murmurs, rubs or gallops Lungs: Lungs CTA bilaterally without wheezing, rhonchi or rales Abdomen: Abdomen soft, non-tender, non-distended. + BS Extremities: No peripheral edema Dialysis Access:  L forearm AVF + bruit  Additional Objective Labs: Basic Metabolic Panel: Recent Labs  Lab 08/25/19 0931 08/26/19 0348 08/27/19 0830  NA 135 135 136  K 4.6 4.6 4.5  CL 91* 96* 96*  CO2 27 25 28   GLUCOSE 80 72 74  BUN 24* 40* 66*  CREATININE 6.58* 8.74* 11.84*  CALCIUM 8.1* 7.3* 7.9*  PHOS  --   --  3.3   Liver Function Tests: Recent Labs  Lab 08/27/19 0830  ALBUMIN 2.9*   No results for input(s): LIPASE, AMYLASE in the last 168 hours. CBC: Recent Labs  Lab 08/24/19 1747  08/25/19 0931 08/26/19 0348 08/26/19 0859 08/27/19 0831  WBC 8.9  --  9.9 9.4 9.1 8.2  NEUTROABS 6.5  --   --   --   --  5.4  HGB 13.1   < > 14.0 8.8* 12.3 11.0*   HCT 42.3   < > 43.5 28.3* 37.2 34.1*  MCV 103.4*  --  101.6* 103.3* 99.5 100.0  PLT 147*  --  146* 152 154 202   < > = values in this interval not displayed.   Blood Culture    Component Value Date/Time   SDES URINE, RANDOM 11/05/2012 2355   SPECREQUEST NONE 11/05/2012 2355   CULT NO GROWTH 11/05/2012 2355   REPTSTATUS 11/07/2012 FINAL 11/05/2012 2355    CBG: Recent Labs  Lab 08/26/19 0802 08/26/19 1130 08/26/19 1607 08/26/19 2111 08/27/19 1122  GLUCAP 98 69* 89 149* 138*   Medications: . amiodarone 60 mg/hr (08/27/19 1054)   Followed by  . amiodarone    . sodium chloride Stopped (08/25/19 1705)   . apixaban  5 mg Oral BID  . atorvastatin  40 mg Oral q1800  . calcitRIOL      . calcitRIOL  0.75 mcg Oral Q M,W,F-HD  . colchicine  0.3 mg Oral Q T,Th,Sa-HD  . gabapentin  100 mg Oral QHS  . heparin      . midodrine  10 mg Oral Once in dialysis  . midodrine  2.5 mg Oral TID WC  . morphine      . multivitamin  1 tablet Oral QHS  . sucroferric oxyhydroxide  1,000 mg Oral TID WC & HS    Dialysis Orders: [facility] MWF 4H 200Re BFR 450 EDW 99kg 2K/2Ca Heparin 10000 Access AVF Parsabiv 15 Calcitriol 0.75 Recent OP Labs: Hgb 12.2 K 4.7 Ca 8.3 Phos 3.9 PTH 1960   Assessment/Plan: 1. Atypical chest pain: Concern for pericarditis. Tachycardic event with CP during dialysis today. On colchicine. Hs-troponin peaked at 12. EKG with diffuse ST elevation. Echo showed LVEF of 60-65%, normal RV function. Cardiology following  2. P Afib: On Eliquis. Intermittent tachycardia since 9/24 (PAF vs SVT vs atrial tachycardia). HR low 100's at time of exam. Amiodarone initiated. Per cardiology.   3. ESRD:  HD MWF. Past two treatments terminated early due to tachycardia. K+ 4.5.  3kg above her EDW but no SOB/edema. Likely needs extra treatment tomorrow but will reassess in the AM.  4. Hypertension/volume: BP soft. On midodrine for BP support. No gross volume excess on exam. Not to EDW due to  hypotension/tachycardia as above. Tentatively plan for extra treatment tomorrow.  5. Anemia: Hgb 11.0. No ESA indicated.  6. Metabolic bone disease: Corrected calcium 8.8. Phos 3.3. Continue binders and calcitriol. On Parasbiv for [facility] - not available in hospital. Resume at discharge.   Anice Paganini, PA-C 08/27/2019, 12:48 PM  [facility] Pager: [phone]  Nephrology attending: Patient was seen and examined at dialysis unit.  Chart reviewed and discussed with the primary team, cardiology, dialysis nurse.  I agree with assessment and plan as outlined above.  63 year old female ESRD on HD admitted with chest pain thought to be due to pericarditis.  She was tachycardic with fluctuation in heart rate from 90s to 170 today cardiology were at bedside.  She was hypotensive therefore ordered midodrine and albumin with improvement in blood pressure.  The dialysis had to be terminated early because of tachycardic episode.  Patient received IV amiodarone and Dilaudid by cardiologist with drop in blood pressure.  Received 250 cc of bolus with somewhat improvement in blood pressure.  I will change midodrine to 10 mg 3 times daily.  Will avoid further IV fluid.  If she remains hypotensive then may need pressors in ICU.  Discussed with Dr. Karleen Hampshire.  We will assess tomorrow morning for dialysis need.  Lawson Radar, MD [facility].

## 2019-08-27 NOTE — Procedures (Signed)
Patient was seen on dialysis and the procedure was supervised.  BFR 450  Via AVF BP was low initially. Ordered midodrine 10 mg and albumin with improvement in BP. HR is fluctuating, cardiology team at bedside.    Patient appears to be tolerating treatment well so far. May need to stop HD if HR remains elevated, d/w dialysis nurse.   Dron Tanna Furry 08/27/2019

## 2019-08-27 NOTE — Progress Notes (Addendum)
Progress Note  Patient Name: Brenda Arnold Date of Encounter: 08/27/2019  Primary Cardiologist: Fransico Him, MD   Subjective   Seen in nuc med. Intermittent tachycardia up to 170s but comes back to 90s very quickly. She has intermittent chest pain and uncomfortable. Order written  For dilaudid.   Inpatient Medications    Scheduled Meds: . apixaban  5 mg Oral BID  . atorvastatin  40 mg Oral q1800  . calcitRIOL      . calcitRIOL  0.75 mcg Oral Q M,W,F-HD  . colchicine  0.3 mg Oral Q T,Th,Sa-HD  . gabapentin  100 mg Oral QHS  . heparin      .  HYDROmorphone (DILAUDID) injection  0.5 mg Intravenous Once  . midodrine  10 mg Oral Once in dialysis  . midodrine  2.5 mg Oral TID WC  . multivitamin  1 tablet Oral QHS  . sucroferric oxyhydroxide  1,000 mg Oral TID WC & HS   Continuous Infusions: . [START ON 08/28/2019] sodium chloride    . [START ON 08/28/2019] sodium chloride    . albumin human    . sodium chloride Stopped (08/25/19 1705)   PRN Meds: [START ON 08/28/2019] sodium chloride, [START ON 08/28/2019] sodium chloride, acetaminophen **OR** acetaminophen, [START ON 08/28/2019] alteplase, [START ON 08/28/2019] heparin, [START ON 08/28/2019] heparin, HYDROmorphone (DILAUDID) injection, [START ON 08/28/2019] lidocaine (PF), [START ON 08/28/2019] lidocaine-prilocaine, morphine injection, [START ON 08/28/2019] pentafluoroprop-tetrafluoroeth   Vital Signs    Vitals:   08/27/19 0730 08/27/19 0800 08/27/19 0830 08/27/19 0900  BP: (!) 102/50 (!) 87/31 (!) 76/34 (!) 124/33  Pulse: 92 81 (!) 57 86  Resp:      Temp:      TempSrc:      SpO2:      Weight:      Height:        Intake/Output Summary (Last 24 hours) at 08/27/2019 0942 Last data filed at 08/27/2019 0655 Gross per 24 hour  Intake 240 ml  Output -  Net 240 ml   Last 3 Weights 08/27/2019 08/27/2019 08/26/2019  Weight (lbs) 224 lb 13.9 oz 222 lb 221 lb 5.5 oz  Weight (kg) 102 kg 100.699 kg 100.4 kg      Telemetry     Sinus rhythm with intermittent tachy with looks like PAF vs SVT or atrial tachycardia - Personally Reviewed  ECG    Pending this morning   Physical Exam   GEN: No acute distress.   Neck: No JVD Cardiac: Irregular with intermittent tachycardia, no murmurs, rubs, or gallops.  Respiratory: Clear to auscultation bilaterally. GI: Soft, nontender, non-distended  MS: No edema; No deformity. Neuro:  Nonfocal  Psych: Normal affect   Labs    High Sensitivity Troponin:   Recent Labs  Lab 08/24/19 1747 08/24/19 2022 08/25/19 0931 08/25/19 1124  TROPONINIHS 7 8 12 10       Chemistry Recent Labs  Lab 08/25/19 0931 08/26/19 0348 08/27/19 0830  NA 135 135 136  K 4.6 4.6 4.5  CL 91* 96* 96*  CO2 27 25 28   GLUCOSE 80 72 74  BUN 24* 40* 66*  CREATININE 6.58* 8.74* 11.84*  CALCIUM 8.1* 7.3* 7.9*  ALBUMIN  --   --  2.9*  GFRNONAA 6* 4* 3*  GFRAA 7* 5* 4*  ANIONGAP 17* 14 12     Hematology Recent Labs  Lab 08/26/19 0348 08/26/19 0859 08/27/19 0831  WBC 9.4 9.1 8.2  RBC 2.74* 3.74* 3.41*  HGB 8.8*  12.3 11.0*  HCT 28.3* 37.2 34.1*  MCV 103.3* 99.5 100.0  MCH 32.1 32.9 32.3  MCHC 31.1 33.1 32.3  RDW 15.6* 15.7* 15.6*  PLT 152 154 202     Radiology    Dg Chest Port 1 View  Result Date: 08/25/2019 CLINICAL DATA:  63 year old female with history of chest pain and palpitations. EXAM: PORTABLE CHEST 1 VIEW COMPARISON:  Chest x-ray 08/24/2019. FINDINGS: Lung volumes are low. Bibasilar opacities which may reflect areas of atelectasis and/or consolidation. Small bilateral pleural effusions. No evidence of pulmonary edema. Heart size is normal. Upper mediastinal contours are within normal limits. Aortic atherosclerosis. IMPRESSION: 1. Low lung volumes with bibasilar areas of atelectasis and/or consolidation and small bilateral pleural effusions. Electronically Signed   By: Vinnie Langton M.D.   On: 08/25/2019 10:19    Cardiac Studies   Echo 08/26/2019 1. Left ventricular  ejection fraction, by visual estimation, is 60 to 65%. The left ventricle has normal function. Normal left ventricular size. There is mildly increased left ventricular hypertrophy.  2. Left ventricular diastolic Doppler parameters are consistent with impaired relaxation pattern of LV diastolic filling.  3. The aortic valve is tricuspid Aortic valve regurgitation was not visualized by color flow Doppler. Mild aortic valve sclerosis without stenosis.  4. Left atrial size was normal.  5. Right atrial size was normal.  6. Global right ventricle has normal systolic function.The right ventricular size is normal. No increase in right ventricular wall thickness.  7. Trivial pericardial effusion is present.  8. The tricuspid valve is normal in structure. Tricuspid valve regurgitation was not visualized by color flow Doppler.  9. The mitral valve is normal in structure. No evidence of mitral valve regurgitation. No evidence of mitral stenosis. 10. TR signal is inadequate for assessing pulmonary artery systolic pressure. 11. The inferior vena cava is normal in size with greater than 50% respiratory variability, suggesting right atrial pressure of 3 mmHg.  Patient Profile     63 y.o. female with a hx of DM2, HTN, PAF, ESRD on HD, OSA, obesity, chest pain s/p MV 2011, Myoview 04/2017 w/out ischemia who is being seen for evaluation of the chest pain and abnormal EKG.   Assessment & Plan    1. Presume pericarditis - Presented with pleuritic chest pain.  Hs-troponin peaked at 12. EKG with diffuse ST elevation. Echo showed LVEF of 60-65%, normal RV function. Trival pericardial effusion.  - Her chest pain could be due to tachycardia this morning. Ibuprofen discontinued 2nd to bleeding risk. On Colchicine on dialysis days. Will give dilaudid for pain. May consider steroids.   2. Anemia - Hgb 14>. (8.0 which was error)>> repeat check 12 however 11 this morning - Follow closely while on Eliquis - Discontinued ASA   3. PAF - baseline in sinus however intermittent tachycardia (PAF vs SVT or atrial tachycardia) since yesterday. - Some of her symptoms could be due to high rate. BP soft to add rate control agent. She is on midodrine for hypotension - Will review with MD for amiodarone options - Continue Eliquis (watch carefully for down trend hemoglobin)   4. ESRD on [place]  5. HLD - on statin   For questions or updates, please contact [place] Please consult [email or web address] for contact info under        Signed, Leanor Kail, PA  08/27/2019, 9:42 AM    I have examined the patient and reviewed assessment and plan and discussed with patient.  Agree with above as stated.  AFib with RVR.  Converted to NSR with IV Amiodarone.  Sx resolved with NSR.  WIll likely need to be switched to oral Amio tomorrow.   SHe had some hypotension after converting.  SHe had received dialysis, Morphine, and dilaudid as well for pain.  She is mentating and answering questions. She will receive midodrin now as scheduled.  If she continues to have low BP, will plan to transfer to higher level of care.  Larae Grooms

## 2019-08-28 ENCOUNTER — Inpatient Hospital Stay (HOSPITAL_COMMUNITY): Payer: BC Managed Care – PPO

## 2019-08-28 DIAGNOSIS — R079 Chest pain, unspecified: Secondary | ICD-10-CM

## 2019-08-28 LAB — CBC
HCT: 32.1 % — ABNORMAL LOW (ref 36.0–46.0)
Hemoglobin: 10.5 g/dL — ABNORMAL LOW (ref 12.0–15.0)
MCH: 32.1 pg (ref 26.0–34.0)
MCHC: 32.7 g/dL (ref 30.0–36.0)
MCV: 98.2 fL (ref 80.0–100.0)
Platelets: 200 10*3/uL (ref 150–400)
RBC: 3.27 MIL/uL — ABNORMAL LOW (ref 3.87–5.11)
RDW: 15.6 % — ABNORMAL HIGH (ref 11.5–15.5)
WBC: 8 10*3/uL (ref 4.0–10.5)
nRBC: 0 % (ref 0.0–0.2)

## 2019-08-28 LAB — ECHOCARDIOGRAM LIMITED
Height: 66 in
Weight: 3569.6 oz

## 2019-08-28 LAB — SEDIMENTATION RATE: Sed Rate: 65 mm/hr — ABNORMAL HIGH (ref 0–22)

## 2019-08-28 LAB — GLUCOSE, CAPILLARY: Glucose-Capillary: 141 mg/dL — ABNORMAL HIGH (ref 70–99)

## 2019-08-28 MED ORDER — CHLORHEXIDINE GLUCONATE CLOTH 2 % EX PADS
6.0000 | MEDICATED_PAD | Freq: Every day | CUTANEOUS | Status: DC
  Administered 2019-08-28 – 2019-09-02 (×3): 6 via TOPICAL

## 2019-08-28 MED ORDER — AMIODARONE HCL 200 MG PO TABS
400.0000 mg | ORAL_TABLET | Freq: Two times a day (BID) | ORAL | Status: DC
  Administered 2019-08-28 – 2019-09-03 (×12): 400 mg via ORAL
  Filled 2019-08-28 (×13): qty 2

## 2019-08-28 NOTE — Progress Notes (Signed)
PROGRESS NOTE    Brenda Arnold  [id number] DOB: [birth year] DOA: 08/25/2019 PCP: Kelton Pillar, MD    Brief Narrative:   63 year old lady with prior h/o type 2 DM, hypotension, on midodrine, ESRD on HD, CAD, OSA presents to ED FOR chest pain. Chest pain possibly pericarditis.   Assessment & Plan:   Principal Problem:   Chest pain Active Problems:   Type 2 diabetes mellitus with complication, without long-term current use of insulin (HCC)   OBESITY   OBESITY-MORBID (>100')   OSA (obstructive sleep apnea)   Hypotension   ESRD on hemodialysis (HCC)   CAD (coronary artery disease)   Tachycardia   Pericarditis     Chest pain possibly from ? Pericarditis vs musculoskeletal. :  On colchicine on the days of HD.  If pain reoccurs, she will probably need steroids.  Currently chest pain free.    Paroxysmal atrial fibrillation with RVR;  Rate control with IV amiodarone. Currently rate in 70's to 90's. No chest pain or sob.  Appreciate cardiology input.  Echocardiogram shows Left ventricular ejection fraction, by visual estimation, is 60 to 65%. The left ventricle has normal function. Normal left ventricular size. There is mildly increased left ventricular hypertrophy. Left ventricular diastolic Doppler parameters are consistent with impaired relaxation pattern of LV diastolic filling  On eliquis for anti coagulation.    ESRD on HD; Plan for HD today as she missed last two sessions.    Hypotension:  Resolved.  Unclear etiology. ? Due to dilaudid and morphine given back to back within one hour, in addition to HD, . Increased the dose of Midodrine to 10 mg TID.    Anemia of chronic disease:  Hemoglobin stable between 10 to 11.   Diabetes Mellitus:  CBG (last 3)  Recent Labs    08/27/19 1625 08/27/19 2134 08/28/19 0811  GLUCAP 135* 83 141*   Resume SSI. cbgs are well controlled. No changes in meds.    Hyperlipidemia:  Resume statin.    DVT  prophylaxis: eliquis.  Code Status: full code Family Communication: none at bedside.  Disposition Plan: pending clinical improvement.    Consultants:   Cardiology Dr Irish Lack   Nephrology Dr Carolin Sicks  PCCM Dr Ander Slade.    Procedures: echocardiogram.   Antimicrobials: none.   Subjective: No chest pain or sob. Nausea , vomiting or abd pain.   Objective: Vitals:   08/28/19 1300 08/28/19 1307 08/28/19 1442 08/28/19 1525  BP: (!) 171/71 (!) 161/86 (!) 92/31 (!) 158/67  Pulse: 86 83 75   Resp:      Temp:   99.7 F (37.6 C)   TempSrc:   Oral   SpO2: 100% 99%    Weight:      Height:        Intake/Output Summary (Last 24 hours) at 08/28/2019 1649 Last data filed at 08/28/2019 1500 Gross per 24 hour  Intake 783.73 ml  Output -  Net 783.73 ml   Filed Weights   08/27/19 0720 08/27/19 1009 08/28/19 0625  Weight: 102 kg 101.5 kg 101.2 kg    Examination:  General exam: alert and comfortable.  Respiratory system:clear lung fields. No wheezing or rhonchi.  Cardiovascular system: s1s2, IRREGULAR. No JVD Gastrointestinal system: abd is soft, NT ND BS+ Central nervous system:alsert and oriented.  Extremities: no pedal edema.  Skin: no rashes.  Psychiatry: mood appropriate.     Data Reviewed: I have personally reviewed following labs and imaging studies  CBC: Recent Labs  Lab 08/24/19 1747  08/25/19 0931 08/26/19 0348 08/26/19 0859 08/27/19 0831 08/28/19 0256  WBC 8.9  --  9.9 9.4 9.1 8.2 8.0  NEUTROABS 6.5  --   --   --   --  5.4  --   HGB 13.1   < > 14.0 8.8* 12.3 11.0* 10.5*  HCT 42.3   < > 43.5 28.3* 37.2 34.1* 32.1*  MCV 103.4*  --  101.6* 103.3* 99.5 100.0 98.2  PLT 147*  --  146* 152 154 202 200   < > = values in this interval not displayed.   Basic Metabolic Panel: Recent Labs  Lab 08/24/19 1747 08/24/19 1802 08/25/19 0931 08/26/19 0348 08/27/19 0830  NA 137 137 135 135 136  K 4.6 4.4 4.6 4.6 4.5  CL 93* 95* 91* 96* 96*  CO2 29  --  27 25 28    GLUCOSE 93 86 80 72 74  BUN 40* 42* 24* 40* 66*  CREATININE 9.73* 9.90* 6.58* 8.74* 11.84*  CALCIUM 8.0*  --  8.1* 7.3* 7.9*  PHOS  --   --   --   --  3.3   GFR: Estimated Creatinine Clearance: 5.9 mL/min (A) (by C-G formula based on SCr of 11.84 mg/dL (H)). Liver Function Tests: Recent Labs  Lab 08/27/19 0830  ALBUMIN 2.9*   No results for input(s): LIPASE, AMYLASE in the last 168 hours. No results for input(s): AMMONIA in the last 168 hours. Coagulation Profile: No results for input(s): INR, PROTIME in the last 168 hours. Cardiac Enzymes: No results for input(s): CKTOTAL, CKMB, CKMBINDEX, TROPONINI in the last 168 hours. BNP (last 3 results) No results for input(s): PROBNP in the last 8760 hours. HbA1C: No results for input(s): HGBA1C in the last 72 hours. CBG: Recent Labs  Lab 08/26/19 2111 08/27/19 1122 08/27/19 1625 08/27/19 2134 08/28/19 0811  GLUCAP 149* 138* 135* 83 141*   Lipid Profile: Recent Labs    08/27/19 0353  CHOL 175  HDL 43  LDLCALC 115*  TRIG 83  CHOLHDL 4.1   Thyroid Function Tests: No results for input(s): TSH, T4TOTAL, FREET4, T3FREE, THYROIDAB in the last 72 hours. Anemia Panel: No results for input(s): VITAMINB12, FOLATE, FERRITIN, TIBC, IRON, RETICCTPCT in the last 72 hours. Sepsis Labs: Recent Labs  Lab 08/25/19 1012  LATICACIDVEN 1.6    Recent Results (from the past 240 hour(s))  SARS CORONAVIRUS 2 (TAT 6-24 HRS) Nasopharyngeal Nasopharyngeal Swab     Status: None   Collection Time: 08/24/19  5:47 PM   Specimen: Nasopharyngeal Swab  Result Value Ref Range Status   SARS Coronavirus 2 NEGATIVE NEGATIVE Final    Comment: (NOTE) SARS-CoV-2 target nucleic acids are NOT DETECTED. The SARS-CoV-2 RNA is generally detectable in upper and lower respiratory specimens during the acute phase of infection. Negative results do not preclude SARS-CoV-2 infection, do not rule out co-infections with other pathogens, and should not be used as  the sole basis for treatment or other patient management decisions. Negative results must be combined with clinical observations, patient history, and epidemiological information. The expected result is Negative. Fact Sheet for Patients: SugarRoll.be Fact Sheet for Healthcare Providers: https://www.woods-mathews.com/ This test is not yet approved or cleared by the [place] FDA and  has been authorized for detection and/or diagnosis of SARS-CoV-2 by FDA under an Emergency Use Authorization (EUA). This EUA will remain  in effect (meaning this test can be used) for the duration of the COVID-19 declaration under Section 56 4(b)(1) of the  Act, 21 U.S.C. section 360bbb-3(b)(1), unless the authorization is terminated or revoked sooner. Performed at [facility], [place] [address], [place], [place] [address]   SARS CORONAVIRUS 2 (TAT 6-24 HRS) Nasopharyngeal Nasopharyngeal Swab     Status: None   Collection Time: 08/25/19 12:22 PM   Specimen: Nasopharyngeal Swab  Result Value Ref Range Status   SARS Coronavirus 2 NEGATIVE NEGATIVE Final    Comment: (NOTE) SARS-CoV-2 target nucleic acids are NOT DETECTED. The SARS-CoV-2 RNA is generally detectable in upper and lower respiratory specimens during the acute phase of infection. Negative results do not preclude SARS-CoV-2 infection, do not rule out co-infections with other pathogens, and should not be used as the sole basis for treatment or other patient management decisions. Negative results must be combined with clinical observations, patient history, and epidemiological information. The expected result is Negative. Fact Sheet for Patients: SugarRoll.be Fact Sheet for Healthcare Providers: https://www.woods-mathews.com/ This test is not yet approved or cleared by the [place] FDA and  has been authorized for detection and/or diagnosis of  SARS-CoV-2 by FDA under an Emergency Use Authorization (EUA). This EUA will remain  in effect (meaning this test can be used) for the duration of the COVID-19 declaration under Section 56 4(b)(1) of the Act, 21 U.S.C. section 360bbb-3(b)(1), unless the authorization is terminated or revoked sooner. Performed at [facility], [place] [address] [place], [place] [address]          Radiology Studies: No results found.      Scheduled Meds: . amiodarone  400 mg Oral BID  . apixaban  5 mg Oral BID  . atorvastatin  40 mg Oral q1800  . calcitRIOL  0.75 mcg Oral Q M,W,F-HD  . Chlorhexidine Gluconate Cloth  6 each Topical Q0600  . colchicine  0.3 mg Oral Q T,Th,Sa-HD  . gabapentin  100 mg Oral QHS  . midodrine  10 mg Oral Once in dialysis  . midodrine  10 mg Oral TID WC  . multivitamin  1 tablet Oral QHS  . sucroferric oxyhydroxide  1,000 mg Oral TID WC & HS   Continuous Infusions: . amiodarone Stopped (08/28/19 1523)  . sodium chloride Stopped (08/25/19 1705)     LOS: 2 days        Hosie Poisson, MD [facility] Pager [phone]  If 7PM-7AM, please contact night-coverage [email or web address] Password [facility] 08/28/2019, 4:49 PM

## 2019-08-28 NOTE — Progress Notes (Signed)
Progress Note  Patient Name: Brenda Arnold Date of Encounter: 08/28/2019  Primary Cardiologist: Fransico Him, MD   Subjective   No complaints this AM  Inpatient Medications    Scheduled Meds: . apixaban  5 mg Oral BID  . atorvastatin  40 mg Oral q1800  . calcitRIOL  0.75 mcg Oral Q M,W,F-HD  . Chlorhexidine Gluconate Cloth  6 each Topical Q0600  . colchicine  0.3 mg Oral Q T,Th,Sa-HD  . gabapentin  100 mg Oral QHS  . midodrine  10 mg Oral Once in dialysis  . midodrine  10 mg Oral TID WC  . multivitamin  1 tablet Oral QHS  . sucroferric oxyhydroxide  1,000 mg Oral TID WC & HS   Continuous Infusions: . amiodarone 30 mg/hr (08/28/19 0245)  . sodium chloride Stopped (08/25/19 1705)   PRN Meds: acetaminophen **OR** acetaminophen, HYDROmorphone (DILAUDID) injection, morphine injection   Vital Signs    Vitals:   08/27/19 2042 08/28/19 0005 08/28/19 0625 08/28/19 0815  BP: (!) 119/52 (!) 112/41 (!) 97/39 (!) 95/42  Pulse: 77 77 73 82  Resp: _0 Temp: 98.6 F (37 C) 99.2 F (37.3 C) 98.7 F (37.1 C) 99 F (37.2 C)  TempSrc: Oral Oral Oral Oral  SpO2: 100% 98% 100% 100%  Weight:   101.2 kg   Height:        Intake/Output Summary (Last 24 hours) at 08/28/2019 0905 Last data filed at 08/27/2019 1800 Gross per 24 hour  Intake 660 ml  Output 318 ml  Net 342 ml   Last 3 Weights 08/28/2019 08/27/2019 08/27/2019  Weight (lbs) 223 lb 1.6 oz 223 lb 12.3 oz 224 lb 13.9 oz  Weight (kg) 101.197 kg 101.5 kg 102 kg      Telemetry    SR - Personally Reviewed  ECG    n/a- Personally Reviewed  Physical Exam   GEN: No acute distress.   Neck: No JVD Cardiac: RRR, no murmurs, rubs, or gallops.  Respiratory: Clear to auscultation bilaterally. GI: Soft, nontender, non-distended  MS: No edema; No deformity. Neuro:  Nonfocal  Psych: Normal affect   Labs    High Sensitivity Troponin:   Recent Labs  Lab 08/24/19 1747 08/24/19 2022 08/25/19 0931 08/25/19  1124  TROPONINIHS _1 Chemistry Recent Labs  Lab 08/25/19 0931 08/26/19 0348 08/27/19 0830  NA 135 135 136  K 4.6 4.6 4.5  CL 91* 96* 96*  CO2 _2 GLUCOSE 80 72 74  BUN 24* 40* 66*  CREATININE 6.58* 8.74* 11.84*  CALCIUM 8.1* 7.3* 7.9*  ALBUMIN  --   --  2.9*  GFRNONAA 6* 4* 3*  GFRAA 7* 5* 4*  ANIONGAP 17* 14 12     Hematology Recent Labs  Lab 08/26/19 0859 08/27/19 0831 08/28/19 0256  WBC 9.1 8.2 8.0  RBC 3.74* 3.41* 3.27*  HGB 12.3 11.0* 10.5*  HCT 37.2 34.1* 32.1*  MCV 99.5 100.0 98.2  MCH 32.9 32.3 32.1  MCHC 33.1 32.3 32.7  RDW 15.7* 15.6* 15.6*  PLT 154 202 200    BNPNo results for input(s): BNP, PROBNP in the last 168 hours.   DDimer No results for input(s): DDIMER in the last 168 hours.   Radiology    No results found.  Cardiac Studies     Patient Profile     63 y.o. female with a hx of DM2, HTN, PAF, ESRD on HD,  OSA, obesity, chest pain s/p MV 2011, Myoview 04/2017 w/out ischemia who is being seen for evaluation of the chest pain and abnormal EKG.   Assessment & Plan    1. Chest pain - admitted with pleuritic chest pain, hstrop negative - EKG SR mild diffuse ST elevation. Suspected pericarditis - 08/2019 echo LVEF 98-10%, grade I diastolic dysfunction, trivial pericardial effusion - ESR and CRP pending  - received ibuprfen but from notes stopped due to bleeding risks since on eliquis for afib - started on colchicine on HD days. - chest pain has resolved this AM   2. PAF - intermittent tachycardia this admission - soft bp's particularly with HD have limited treatment options - started on IV amiodarone yesterday in setting of tachycardia, hypotension, SOB  - on eliquis for stroke prevention - amio 150 boluse yesterday at 11AM, has been on drip since. Would complete 24 hour load and then convert to oral 453m bid x 7 days, 2080mbid x 2 weeks, then 20036maily - maintaining SR    3. ESRD  4. Hypotension - has  had some chronic issues with hypotension, particularly with HD. Has been on midodrine. Significant issues thoughout day yesterday with low bps, soft this AM - echo with normal LVEF and RV function and currently rhythm is controlled, do no suspect primary cardiac etiology. With pericarditis and mild effusion 9/24 with hypotensio and tachy yesterday we will repeat limited echo to be sure there is no rapid progression of effusion.  - continue midodrine. Avoid any av nodal agents or bp agents   For questions or updates, please contact [facility] Please consult [email or web address] for contact info under        Signed, BraCarlyle DollyD  08/28/2019, 9:06 AM

## 2019-08-28 NOTE — Progress Notes (Addendum)
[facility] Progress Note   Subjective:   Seen in room. Feeling much better today. BP improved to [id number] systolic. Now on midodrine TID. Denies any SOB, O2 sat 100% on RA. Reports CP is resolved today even with deep breaths. Denies cough, CP, palpitations, abdominal pain, N/V/D, edema. Dizziness resolved.   Objective Vitals:   08/27/19 2042 08/28/19 0005 08/28/19 0625 08/28/19 0815  BP: (!) 119/52 (!) 112/41 (!) 97/39 (!) 95/42  Pulse: 77 77 73 82  Resp: 16 16 16    Temp: 98.6 F (37 C) 99.2 F (37.3 C) 98.7 F (37.1 C) 99 F (37.2 C)  TempSrc: Oral Oral Oral Oral  SpO2: 100% 98% 100% 100%  Weight:   101.2 kg   Height:       Physical Exam General: Well developed, alert female in NAD.  Heart:RRR, no murmurs, rubs or gallops Lungs: Lungs CTA bilaterally without wheezing, rhonchi or rales Abdomen: Abdomen soft, non-tender, non-distended. + BS Extremities: No peripheral edema Dialysis Access:  L forearm AVF + bruit   Additional Objective Labs: Basic Metabolic Panel: Recent Labs  Lab 08/25/19 0931 08/26/19 0348 08/27/19 0830  NA 135 135 136  K 4.6 4.6 4.5  CL 91* 96* 96*  CO2 27 25 28   GLUCOSE 80 72 74  BUN 24* 40* 66*  CREATININE 6.58* 8.74* 11.84*  CALCIUM 8.1* 7.3* 7.9*  PHOS  --   --  3.3   Liver Function Tests: Recent Labs  Lab 08/27/19 0830  ALBUMIN 2.9*   CBC: Recent Labs  Lab 08/24/19 1747  08/25/19 0931 08/26/19 0348 08/26/19 0859 08/27/19 0831 08/28/19 0256  WBC 8.9  --  9.9 9.4 9.1 8.2 8.0  NEUTROABS 6.5  --   --   --   --  5.4  --   HGB 13.1   < > 14.0 8.8* 12.3 11.0* 10.5*  HCT 42.3   < > 43.5 28.3* 37.2 34.1* 32.1*  MCV 103.4*  --  101.6* 103.3* 99.5 100.0 98.2  PLT 147*  --  146* 152 154 202 200   < > = values in this interval not displayed.   Blood Culture    Component Value Date/Time   SDES URINE, RANDOM 11/05/2012 2355   SPECREQUEST NONE 11/05/2012 2355   CULT NO GROWTH 11/05/2012 2355   REPTSTATUS 11/07/2012  FINAL 11/05/2012 2355    CBG: Recent Labs  Lab 08/26/19 2111 08/27/19 1122 08/27/19 1625 08/27/19 2134 08/28/19 0811  GLUCAP 149* 138* 135* 83 141*   Medications: . amiodarone 30 mg/hr (08/28/19 0245)  . sodium chloride Stopped (08/25/19 1705)   . amiodarone  400 mg Oral BID  . apixaban  5 mg Oral BID  . atorvastatin  40 mg Oral q1800  . calcitRIOL  0.75 mcg Oral Q M,W,F-HD  . Chlorhexidine Gluconate Cloth  6 each Topical Q0600  . colchicine  0.3 mg Oral Q T,Th,Sa-HD  . gabapentin  100 mg Oral QHS  . midodrine  10 mg Oral Once in dialysis  . midodrine  10 mg Oral TID WC  . multivitamin  1 tablet Oral QHS  . sucroferric oxyhydroxide  1,000 mg Oral TID WC & HS    Dialysis Orders: [facility] MWF 4H 200Re BFR 450 EDW 99kg 2K/2Ca Heparin 10000 Access AVF Parsabiv 15 Calcitriol 0.75 Recent OP Labs: Hgb 12.2 K 4.7 Ca 8.3 Phos 3.9 PTH 1960  Assessment/Plan: 1. Atypical chest pain: Concern for pericarditis.  On colchicine. Hs-troponin peaked at 12. EKG with diffuse ST  elevation. Echo showed LVEF of 60-65%, normal RV function. Cardiology following  2. PAfib: On Eliquis. Tachycardic event with CP during dialysis on 9/25 resulting in shortened treatment. Intermittent tachycardia since 9/24 (PAF vs SVT vs atrial tachycardia). HR low 100's at time of exam. Amiodarone initiated 9/25. Per cardiology.  3. ESRD:HD MWF. Past two treatments terminated early due to tachycardia. K+ 4.5.  2.2 kg above her EDW but no SOB/edema. Will plan for abbreviated extra treatment today, then resume MWF schedule.  4. Hypertension/volume: BP soft. On midodrine for BP support, increased to TID yesterday. Not to EDW due to hypotension/tachycardia as above. Extra treatment today with UF to EDW as tolerated.  5. Anemia: Hgb 10.5. No ESA indicated.  6. Metabolic bone disease: Corrected calcium 8.8.Phos 3.3. Continue binders and calcitriol. On Parasbivfor 2HPTH- not availablein hospital. Resume at discharge.     Anice Paganini, PA-C 08/28/2019, 1:17 PM  [facility] Pager: [phone]  Pt seen, examined and agree w A/P as above. Feeling much better today.  Kelly Splinter  MD 08/28/2019, 2:06 PM

## 2019-08-28 NOTE — Progress Notes (Signed)
  Echocardiogram 2D Echocardiogram has been performed.  Brenda Arnold 08/28/2019, 11:38 AM

## 2019-08-29 DIAGNOSIS — N186 End stage renal disease: Secondary | ICD-10-CM

## 2019-08-29 DIAGNOSIS — Z992 Dependence on renal dialysis: Secondary | ICD-10-CM

## 2019-08-29 LAB — HIGH SENSITIVITY CRP: CRP, High Sensitivity: 123.91 mg/L — ABNORMAL HIGH (ref 0.00–3.00)

## 2019-08-29 MED ORDER — ONDANSETRON HCL 4 MG/2ML IJ SOLN
4.0000 mg | Freq: Four times a day (QID) | INTRAMUSCULAR | Status: DC | PRN
  Administered 2019-08-29 – 2019-08-31 (×2): 4 mg via INTRAVENOUS
  Filled 2019-08-29 (×2): qty 2

## 2019-08-29 MED ORDER — MENTHOL 3 MG MT LOZG
1.0000 | LOZENGE | OROMUCOSAL | Status: DC | PRN
  Administered 2019-08-29: 3 mg via ORAL
  Filled 2019-08-29: qty 9

## 2019-08-29 NOTE — Progress Notes (Signed)
Patient ID: Brenda Arnold, female   DOB: 04-Jul-1956, 63 y.o.   MRN: [id number]                                       [facility]  Patient name: Brenda Arnold MRN: [id number] DOB: [birth year] Sex: female    HPI: Brenda Arnold is a 63 y.o. female admitted on 08/24/2019 with chest pain.  Her work-up is been noted.  She does have a 2-1/2-year history of end-stage renal disease on hemodialysis.  She has a left brachiocephalic fistula which was placed over 5 years ago.  This is been present for sometimes prior to initiation for hemodialysis.  She has never had any additional dialysis access sites and has had no difficulty with her fistula.  She does report 2 episodes in the past where she had some prolonged bleeding but this is been rare for her.  She has had no wound issues over her access.  Past Medical History:  Diagnosis Date  . Anemia   . Arthritis   . Asthma   . Complication of anesthesia    difficulty with getting oxygen saturation up  . Diabetes mellitus   . Dyslipidemia   . Epistaxis 11/05/2012  . ESRD (end stage renal disease) on dialysis [facility])    "MWF; [facility]" (09/15/2018)  . History of nuclear stress test    Myoview 5/18: EF 68, no infarct or ischemia, low risk  . HTN (hypertension)   . Hyperlipidemia   . Hyperparathyroidism due to renal insufficiency ([place])   . Obesity    s/p panniculectomy  . Paroxysmal A-fib (HCC)    a. chronic coumadin;  b. 12/2009 Echo: EF 60-65%, Gr 1 DD.  Marland Kitchen PCOS (polycystic ovarian syndrome)   . Pericarditis 08/2019  . Pneumonia   . Seasonal allergies   . Sleep apnea    a. not using CPAP, last study  >8 yrs  . Vitamin D deficiency     Family History  Problem Relation Age of Onset  . Lung cancer Father        died @ 48  . Hypertension Mother        alive @ 67  . Diabetes Brother   . Kidney disease Brother     SOCIAL HISTORY: Social History   Tobacco Use  . Smoking status: Never Smoker  .  Smokeless tobacco: Never Used  Substance Use Topics  . Alcohol use: No    Alcohol/week: 0.0 standard drinks    Allergies  Allergen Reactions  . Iodine Shortness Of Breath  . Shellfish Allergy Shortness Of Breath  . Norvasc [Amlodipine] Swelling    SWELLING REACTION UNSPECIFIED     Current Facility-Administered Medications  Medication Dose Route Frequency Provider Last Rate Last Dose  . acetaminophen (TYLENOL) tablet 650 mg  650 mg Oral Q6H PRN Bonnell Public Tublu, MD   650 mg at 08/29/19 [id number]   Or  . acetaminophen (TYLENOL) suppository 650 mg  650 mg Rectal Q6H PRN Bonnell Public Tublu, MD      . amiodarone (PACERONE) tablet 400 mg  400 mg Oral BID Arnoldo Lenis, MD   400 mg at 08/28/19 2126  . apixaban (ELIQUIS) tablet 5 mg  5 mg Oral BID Hosie Poisson, MD   5 mg at 08/29/19 1032  . atorvastatin (LIPITOR) tablet 40 mg  40 mg Oral q1800  Vashti Hey, MD   40 mg at 08/28/19 1733  . calcitRIOL (ROCALTROL) capsule 0.75 mcg  0.75 mcg Oral Q M,W,F-HD Lynnda Child, PA-C   0.75 mcg at 08/27/19 [id number]  . Chlorhexidine Gluconate Cloth 2 % PADS 6 each  6 each Topical Q0600 Janalee Dane, PA-C   6 each at 08/28/19 [id number]  . colchicine tablet 0.3 mg  0.3 mg Oral Q T,Th,Sa-HD Furth, Cadence H, PA-C   0.3 mg at 08/28/19 1257  . gabapentin (NEURONTIN) capsule 100 mg  100 mg Oral QHS Bonnell Public Tublu, MD   100 mg at 08/28/19 2126  . HYDROmorphone (DILAUDID) injection 0.5-1 mg  0.5-1 mg Intravenous Q6H PRN Bhagat, Bhavinkumar, PA      . menthol-cetylpyridinium (CEPACOL) lozenge 3 mg  1 lozenge Oral PRN Hosie Poisson, MD      . midodrine (PROAMATINE) tablet 10 mg  10 mg Oral Once in dialysis Hosie Poisson, MD   Stopped at 08/27/19 0919  . midodrine (PROAMATINE) tablet 10 mg  10 mg Oral TID WC Rosita Fire, MD   10 mg at 08/29/19 0807  . morphine 2 MG/ML injection 1 mg  1 mg Intravenous Q4H PRN Eulogio Bear U, DO   1 mg at 08/27/19 0946  .  multivitamin (RENA-VIT) tablet 1 tablet  1 tablet Oral QHS Lynnda Child, PA-C   1 tablet at 08/28/19 2126  . sodium chloride 0.9 % bolus 500 mL  500 mL Intravenous Once Vashti Hey, MD   Stopped at 08/25/19 1705  . sucroferric oxyhydroxide (VELPHORO) chewable tablet 1,000 mg  1,000 mg Oral TID WC & HS Vashti Hey, MD   1,000 mg at 08/29/19 0807    REVIEW OF SYSTEMS:  [X]  denotes positive finding, [ ]  denotes negative finding Cardiac  Comments:  Chest pain or chest pressure: x   Shortness of breath upon exertion:    Short of breath when lying flat:    Irregular heart rhythm:        Vascular    Pain in calf, thigh, or hip brought on by ambulation:    Pain in feet at night that wakes you up from your sleep:     Blood clot in your veins:    Leg swelling:           PHYSICAL EXAM: Vitals:   08/28/19 2115 08/29/19 0012 08/29/19 0642 08/29/19 0753  BP: (!) 100/51  118/67   Pulse: 78  79   Resp: 16  16 12   Temp: 100.1 F (37.8 C) 98.8 F (37.1 C) 100.2 F (37.9 C) 99.8 F (37.7 C)  TempSrc: Oral Oral Oral Oral  SpO2: 97%  97% 98%  Weight:   102.4 kg   Height:        GENERAL: The patient is a well-nourished female, in no acute distress. The vital signs are documented above. CARDIOVASCULAR: She does have well-developed left radiocephalic AV fistula.  She does have some hypopigmentation over the areas of access where the vein is dilated.  On the lower most area of access she does have superficial ulceration which is not full-thickness.  Her skin is mobile over the vein with no evidence of erosion into the vein. PULMONARY: There is good air exchange  MUSCULOSKELETAL: There are no major deformities or cyanosis. NEUROLOGIC: No focal weakness or paresthesias are detected. SKIN: There are no ulcers or rashes noted. PSYCHIATRIC: The patient has a normal affect.  DATA:  None  MEDICAL ISSUES:  Patient has had very successful use of this radiocephalic  fistula.  I do not see any evidence of full-thickness skin loss.  I feel it is safe to continue to use her access.  It would be appropriate to avoid these areas of superficial blistering until they are completely healed.  She does have a good sized vein from her mid forearm proximally but the vein does run deep to the fat.  This area could also be used for access but would require superficial mobilization of her cephalic vein fistula.  Would not recommend this as long as she is having adequate access is for the level area at her distal forearm.  We will not follow actively.  Please call if we can assist    Rosetta Posner, MD [facility] Tel [phone] Pager [phone]

## 2019-08-29 NOTE — Progress Notes (Signed)
Pt c/o sore throat.  Stated it started last night.  She also stated she had been having labored breathing. Temp 99.8 orally, resp 12, O2 sat 98% on RA.  Lung sounds diminished.  Administered tylenol for her sore throat.  Dr. Karleen Hampshire made aware of above.   Idolina Primer, RN

## 2019-08-29 NOTE — Progress Notes (Signed)
SBP elevated this afternoon from 150s-170s.  Dr. Karleen Hampshire made aware. No new orders at this time.  Idolina Primer, RN

## 2019-08-29 NOTE — Progress Notes (Signed)
[facility] Progress Note   Subjective:   Patient seen in room. Feeling well, denies SOB, dyspnea, CP, palpitations, abdominal pain, N/V/D. Occasional CP with deep inspiration.  Noted to have area of thinned skin with some erythema over two aneurysms in AVF. Pt reports these have been getting slightly worse over the past several months and has had intermittent prolonged bleeding episodes, but none recently. No pain. Initially planned for extra HD today due to abbreviated treatments but will defer until tomorrow due to high patient load.  Objective Vitals:   08/28/19 2115 08/29/19 0012 08/29/19 0642 08/29/19 0753  BP: (!) 100/51  118/67   Pulse: 78  79   Resp: 16  16 12   Temp: 100.1 F (37.8 C) 98.8 F (37.1 C) 100.2 F (37.9 C) 99.8 F (37.7 C)  TempSrc: Oral Oral Oral Oral  SpO2: 97%  97% 98%  Weight:   102.4 kg   Height:       Physical Exam General: Well developed, well nourished female. Alert and in NAD Heart: RRR, no murmurs, rubs or gallops Lungs: CTA bilaterally without wheezing, rhonchi or rales Abdomen: Soft, non-tender, non-distended. +BS Extremities: No peripheral edema Dialysis Access:  L forearm AVF with two aneurysms, lightened skin over aneurysm with small area of erythema. +Bruit  Additional Objective Labs: Basic Metabolic Panel: Recent Labs  Lab 08/25/19 0931 08/26/19 0348 08/27/19 0830  NA 135 135 136  K 4.6 4.6 4.5  CL 91* 96* 96*  CO2 27 25 28   GLUCOSE 80 72 74  BUN 24* 40* 66*  CREATININE 6.58* 8.74* 11.84*  CALCIUM 8.1* 7.3* 7.9*  PHOS  --   --  3.3   Liver Function Tests: Recent Labs  Lab 08/27/19 0830  ALBUMIN 2.9*   CBC: Recent Labs  Lab 08/24/19 1747  08/25/19 0931 08/26/19 0348 08/26/19 0859 08/27/19 0831 08/28/19 0256  WBC 8.9  --  9.9 9.4 9.1 8.2 8.0  NEUTROABS 6.5  --   --   --   --  5.4  --   HGB 13.1   < > 14.0 8.8* 12.3 11.0* 10.5*  HCT 42.3   < > 43.5 28.3* 37.2 34.1* 32.1*  MCV 103.4*  --  101.6*  103.3* 99.5 100.0 98.2  PLT 147*  --  146* 152 154 202 200   < > = values in this interval not displayed.   Blood Culture    Component Value Date/Time   SDES URINE, RANDOM 11/05/2012 2355   SPECREQUEST NONE 11/05/2012 2355   CULT NO GROWTH 11/05/2012 2355   REPTSTATUS 11/07/2012 FINAL 11/05/2012 2355    CBG: Recent Labs  Lab 08/26/19 2111 08/27/19 1122 08/27/19 1625 08/27/19 2134 08/28/19 0811  GLUCAP 149* 138* 135* 83 141*   Medications: . sodium chloride Stopped (08/25/19 1705)   . amiodarone  400 mg Oral BID  . apixaban  5 mg Oral BID  . atorvastatin  40 mg Oral q1800  . calcitRIOL  0.75 mcg Oral Q M,W,F-HD  . Chlorhexidine Gluconate Cloth  6 each Topical Q0600  . colchicine  0.3 mg Oral Q T,Th,Sa-HD  . gabapentin  100 mg Oral QHS  . midodrine  10 mg Oral Once in dialysis  . midodrine  10 mg Oral TID WC  . multivitamin  1 tablet Oral QHS  . sucroferric oxyhydroxide  1,000 mg Oral TID WC & HS    Dialysis Orders: [facility] MWF 4H 200Re BFR 450 EDW 99kg 2K/2Ca Heparin 10000 Access AVF [employer]  15 Calcitriol 0.75 Recent OP Labs: Hgb 12.2 K 4.7 Ca 8.3 Phos 3.9 PTH 1960  Assessment/Plan: 1. Atypical chest pain:Concern for pericarditis.On colchicine.Hs-troponin peaked at 12. EKG with diffuse ST elevation. Echo showed LVEF of 60-65%, normal RV function.CP significantly improved. Cardiology following 2. PAfib:On Eliquis. Tachycardic event with CP during dialysis on 9/25 resulting in shortened treatment. Intermittent tachycardia since 9/24 (PAF vs SVT vs atrial tachycardia). HR low 70's-80's at time of exam today. Amiodarone initiated 9/25. Repeat echo pending to evaluate for worsening effusion. Per cardiology. 3. ESRD:HD MWF.Past two treatments terminated early due to tachycardia. K+ 4.5. 3.4 kg above her EDW but no SOB/edema.  Initially planned for extra HD today due to abbreviated treatments but will defer until tomorrow due to high patient load. Aneurysms evaluated  by Dr. Donnetta Hutching, reports it is safe to use access and to avoid areas of superficial blistering. Appreciate his assistance.  4. Hypertension/volume:BP soft. On midodrine for BP support, increased to TID and BP now stable.Not to EDW past two dialysis sessions due to hypotension/tachycardia as above. HR and BP now stabilized, UF as tolerated.  5. Anemia:Hgb 10.5. No ESA indicated. 6. Metabolic bone disease: Corrected calcium 8.8.Phos 3.3.Continue bindersand calcitriol. On Parasbivfor 2HPTH- not availablein hospital. Resume at discharge.   Anice Paganini, PA-C 08/29/2019, 12:33 PM  [facility] Pager: [phone]

## 2019-08-29 NOTE — Progress Notes (Signed)
Progress Note  Patient Name: BABE ANTHIS Date of Encounter: 08/29/2019  Primary Cardiologist: Fransico Him, MD   Subjective   No chest pain or SOB this AM  Inpatient Medications    Scheduled Meds: . amiodarone  400 mg Oral BID  . apixaban  5 mg Oral BID  . atorvastatin  40 mg Oral q1800  . calcitRIOL  0.75 mcg Oral Q M,W,F-HD  . Chlorhexidine Gluconate Cloth  6 each Topical Q0600  . colchicine  0.3 mg Oral Q T,Th,Sa-HD  . gabapentin  100 mg Oral QHS  . midodrine  10 mg Oral Once in dialysis  . midodrine  10 mg Oral TID WC  . multivitamin  1 tablet Oral QHS  . sucroferric oxyhydroxide  1,000 mg Oral TID WC & HS   Continuous Infusions: . amiodarone Stopped (08/28/19 1523)  . sodium chloride Stopped (08/25/19 1705)   PRN Meds: acetaminophen **OR** acetaminophen, HYDROmorphone (DILAUDID) injection, morphine injection   Vital Signs    Vitals:   08/28/19 2115 08/29/19 0012 08/29/19 0642 08/29/19 0753  BP: (!) 100/51  118/67   Pulse: 78  79   Resp: '16  16 12  ' Temp: 100.1 F (37.8 C) 98.8 F (37.1 C) 100.2 F (37.9 C) 99.8 F (37.7 C)  TempSrc: Oral Oral Oral Oral  SpO2: 97%  97% 98%  Weight:   102.4 kg   Height:        Intake/Output Summary (Last 24 hours) at 08/29/2019 0827 Last data filed at 08/28/2019 1500 Gross per 24 hour  Intake 563.73 ml  Output -  Net 563.73 ml   Last 3 Weights 08/29/2019 08/28/2019 08/27/2019  Weight (lbs) 225 lb 11.2 oz 223 lb 1.6 oz 223 lb 12.3 oz  Weight (kg) 102.377 kg 101.197 kg 101.5 kg      Telemetry    SR - Personally Reviewed  ECG    n/a - Personally Reviewed  Physical Exam   GEN: No acute distress.   Neck: No JVD Cardiac: RRR, no murmurs, rubs, or gallops.  Respiratory: Clear to auscultation bilaterally. GI: Soft, nontender, non-distended  MS: No edema; No deformity. Neuro:  Nonfocal  Psych: Normal affect   Labs    High Sensitivity Troponin:   Recent Labs  Lab 08/24/19 1747 08/24/19 2022 08/25/19  0931 08/25/19 1124  TROPONINIHS '7 8 12 10      ' Chemistry Recent Labs  Lab 08/25/19 0931 08/26/19 0348 08/27/19 0830  NA 135 135 136  K 4.6 4.6 4.5  CL 91* 96* 96*  CO2 '27 25 28  ' GLUCOSE 80 72 74  BUN 24* 40* 66*  CREATININE 6.58* 8.74* 11.84*  CALCIUM 8.1* 7.3* 7.9*  ALBUMIN  --   --  2.9*  GFRNONAA 6* 4* 3*  GFRAA 7* 5* 4*  ANIONGAP 17* 14 12     Hematology Recent Labs  Lab 08/26/19 0859 08/27/19 0831 08/28/19 0256  WBC 9.1 8.2 8.0  RBC 3.74* 3.41* 3.27*  HGB 12.3 11.0* 10.5*  HCT 37.2 34.1* 32.1*  MCV 99.5 100.0 98.2  MCH 32.9 32.3 32.1  MCHC 33.1 32.3 32.7  RDW 15.7* 15.6* 15.6*  PLT 154 202 200    BNPNo results for input(s): BNP, PROBNP in the last 168 hours.   DDimer No results for input(s): DDIMER in the last 168 hours.   Radiology    No results found.  Cardiac Studies     Patient Profile     63 y.o.femalewith a hx of DM2,  HTN, PAF, ESRD on HD, OSA, obesity, chest pain s/p MV 2011, Myoview 04/2017 w/out ischemia who is being seen for evaluation of the chest pain and abnormal EKG.  Assessment & Plan    1. Chest pain - admitted with pleuritic chest pain, hstrop negative - EKG SR mild diffuse ST elevation. Suspected pericarditis - 08/2019 echo LVEF 06-89%, grade I diastolic dysfunction, trivial pericardial effusion - ESR 65,  CRP pending  - received ibuprfen but from notes stopped due to bleeding risks since on eliquis for afib - started on colchicine on HD days. - chest pain has resolved   - repeat echo yesterday due to some issues with hypotension and afib with RVR on Friday. Echo shows effusion has increased in size, now moderate but no signs of tamponade physiology   2. PAF - intermittent tachycardia this admission - soft bp's particularly with HD have limited treatment options - started on IV amiodarone this admission with conversion to SR. Yesterday converted to oral amio.  -  Plan for oral 463m bid x 7 days, 2064mbid x 2  weeks, then 20013maily - on eliquis for stroke prevention    3. ESRD  4. Hypotension - has had some chronic issues with hypotension, particularly with HD - some worsening issues with low bp's over last few days, we repeat echo to reevaluate her effusion which shows effusion has increased in size but no signs of tamponade  For questions or updates, please contact [facility] Please consult [email or web address] for contact info under        Signed, BraCarlyle DollyD  08/29/2019, 8:27 AM

## 2019-08-29 NOTE — Progress Notes (Signed)
PROGRESS NOTE    Brenda Arnold  [id number] DOB: 29-Apr-1956 DOA: 08/25/2019 PCP: Kelton Pillar, MD    Brief Narrative:   63 year old lady with prior h/o type 2 DM, hypotension, on midodrine, ESRD on HD, CAD, OSA presents to ED FOR chest pain. Chest pain possibly pericarditis.   Assessment & Plan:   Principal Problem:   Chest pain Active Problems:   Type 2 diabetes mellitus with complication, without long-term current use of insulin (HCC)   OBESITY   OBESITY-MORBID (>100')   OSA (obstructive sleep apnea)   Hypotension   ESRD on hemodialysis (HCC)   CAD (coronary artery disease)   Tachycardia   Pericarditis     Chest pain possibly from ? Pericarditis vs musculoskeletal. :  On colchicine on the days of HD.  If pain reoccurs, she will probably need steroids.  She had one episode of chest pain earlier today resolved with some Dilaudid.   Paroxysmal atrial fibrillation with RVR;  Rate control with IV amiodarone initially transition to oral amiodarone 400 mg twice daily for 1 week followed by 200 mg twice daily for 2 more weeks followed by 200 mg daily. Appreciate cardiology input.  Echocardiogram shows Left ventricular ejection fraction, by visual estimation, is 60 to 65%. The left ventricle has normal function. Normal left ventricular size. There is mildly increased left ventricular hypertrophy. Left ventricular diastolic Doppler parameters are consistent with impaired relaxation pattern of LV diastolic filling  Repeat echocardiogram shows moderate pericardial effusion without any signs of tamponade physiology.  On eliquis for anti coagulation.    ESRD on HD; Plan for HD tomorrow as per nephrology.   Hypotension on 08/27/2019:  Resolved.  Unclear etiology. ? Due to dilaudid and morphine given back to back within one hour, in addition to HD, . Increased the dose of Midodrine to 10 mg TID.    Anemia of chronic disease:  Hemoglobin stable between 10 to 11.   Diabetes Mellitus:  CBG (last 3)  Recent Labs    08/27/19 1625 08/27/19 2134 08/28/19 0811  GLUCAP 135* 83 141*   Resume SSI. cbgs are well controlled. No changes in meds.    Hyperlipidemia:  Resume statin.  No changes in medication   DVT prophylaxis: eliquis.  Code Status: full code Family Communication: none at bedside.  Disposition Plan: pending clinical improvement.    Consultants:   Cardiology Dr Irish Lack   Nephrology Dr Carolin Sicks  PCCM Dr Ander Slade.    Procedures: echocardiogram.   Antimicrobials: none.   Subjective: Earlier one episode of some chest pain which was resolved with IV Dilaudid.  No nausea vomiting or abdominal pain some shortness of breath.  Objective: Vitals:   08/29/19 1446 08/29/19 1600 08/29/19 1603 08/29/19 1643  BP: (!) 159/48 (!) 176/43 (!) 152/46 (!) 167/51  Pulse: 86     Resp:      Temp: 100.3 F (37.9 C)     TempSrc: Oral     SpO2:      Weight:      Height:       No intake or output data in the 24 hours ending 08/29/19 1732 Filed Weights   08/27/19 1009 08/28/19 0625 08/29/19 0642  Weight: 101.5 kg 101.2 kg 102.4 kg    Examination:  General exam: Alert and comfortable not in any kind of distress Respiratory system: Air entry fair, no wheezing or rhonchi Cardiovascular system: S1-S2 heard, irregularly irregular, no JVD, no pedal edema Gastrointestinal system: Abdomen is soft, nontender, nondistended,  bowel sounds are good Central nervous system: Alert and oriented, grossly nonfocal Extremities: No pedal edema, cyanosis or clubbing Skin: No rashes or ulcers seen Psychiatry: Mood is appropriate    Data Reviewed: I have personally reviewed following labs and imaging studies  CBC: Recent Labs  Lab 08/24/19 1747  08/25/19 0931 08/26/19 0348 08/26/19 0859 08/27/19 0831 08/28/19 0256  WBC 8.9  --  9.9 9.4 9.1 8.2 8.0  NEUTROABS 6.5  --   --   --   --  5.4  --   HGB 13.1   < > 14.0 8.8* 12.3 11.0* 10.5*  HCT 42.3    < > 43.5 28.3* 37.2 34.1* 32.1*  MCV 103.4*  --  101.6* 103.3* 99.5 100.0 98.2  PLT 147*  --  146* 152 154 202 200   < > = values in this interval not displayed.   Basic Metabolic Panel: Recent Labs  Lab 08/24/19 1747 08/24/19 1802 08/25/19 0931 08/26/19 0348 08/27/19 0830  NA 137 137 135 135 136  K 4.6 4.4 4.6 4.6 4.5  CL 93* 95* 91* 96* 96*  CO2 29  --  27 25 28   GLUCOSE 93 86 80 72 74  BUN 40* 42* 24* 40* 66*  CREATININE 9.73* 9.90* 6.58* 8.74* 11.84*  CALCIUM 8.0*  --  8.1* 7.3* 7.9*  PHOS  --   --   --   --  3.3   GFR: Estimated Creatinine Clearance: 5.9 mL/min (A) (by C-G formula based on SCr of 11.84 mg/dL (H)). Liver Function Tests: Recent Labs  Lab 08/27/19 0830  ALBUMIN 2.9*   No results for input(s): LIPASE, AMYLASE in the last 168 hours. No results for input(s): AMMONIA in the last 168 hours. Coagulation Profile: No results for input(s): INR, PROTIME in the last 168 hours. Cardiac Enzymes: No results for input(s): CKTOTAL, CKMB, CKMBINDEX, TROPONINI in the last 168 hours. BNP (last 3 results) No results for input(s): PROBNP in the last 8760 hours. HbA1C: No results for input(s): HGBA1C in the last 72 hours. CBG: Recent Labs  Lab 08/26/19 2111 08/27/19 1122 08/27/19 1625 08/27/19 2134 08/28/19 0811  GLUCAP 149* 138* 135* 83 141*   Lipid Profile: Recent Labs    08/27/19 0353  CHOL 175  HDL 43  LDLCALC 115*  TRIG 83  CHOLHDL 4.1   Thyroid Function Tests: No results for input(s): TSH, T4TOTAL, FREET4, T3FREE, THYROIDAB in the last 72 hours. Anemia Panel: No results for input(s): VITAMINB12, FOLATE, FERRITIN, TIBC, IRON, RETICCTPCT in the last 72 hours. Sepsis Labs: Recent Labs  Lab 08/25/19 1012  LATICACIDVEN 1.6    Recent Results (from the past 240 hour(s))  SARS CORONAVIRUS 2 (TAT 6-24 HRS) Nasopharyngeal Nasopharyngeal Swab     Status: None   Collection Time: 08/24/19  5:47 PM   Specimen: Nasopharyngeal Swab  Result Value Ref  Range Status   SARS Coronavirus 2 NEGATIVE NEGATIVE Final    Comment: (NOTE) SARS-CoV-2 target nucleic acids are NOT DETECTED. The SARS-CoV-2 RNA is generally detectable in upper and lower respiratory specimens during the acute phase of infection. Negative results do not preclude SARS-CoV-2 infection, do not rule out co-infections with other pathogens, and should not be used as the sole basis for treatment or other patient management decisions. Negative results must be combined with clinical observations, patient history, and epidemiological information. The expected result is Negative. Fact Sheet for Patients: SugarRoll.be Fact Sheet for Healthcare Providers: https://www.woods-mathews.com/ This test is not yet approved or cleared by the  [place] Architectural technologist and  has been authorized for detection and/or diagnosis of SARS-CoV-2 by FDA under an Print production planner (EUA). This EUA will remain  in effect (meaning this test can be used) for the duration of the COVID-19 declaration under Section 56 4(b)(1) of the Act, 21 U.S.C. section 360bbb-3(b)(1), unless the authorization is terminated or revoked sooner. Performed at [facility], [place] [address], [place], [place] [address]   SARS CORONAVIRUS 2 (TAT 6-24 HRS) Nasopharyngeal Nasopharyngeal Swab     Status: None   Collection Time: 08/25/19 12:22 PM   Specimen: Nasopharyngeal Swab  Result Value Ref Range Status   SARS Coronavirus 2 NEGATIVE NEGATIVE Final    Comment: (NOTE) SARS-CoV-2 target nucleic acids are NOT DETECTED. The SARS-CoV-2 RNA is generally detectable in upper and lower respiratory specimens during the acute phase of infection. Negative results do not preclude SARS-CoV-2 infection, do not rule out co-infections with other pathogens, and should not be used as the sole basis for treatment or other patient management decisions. Negative results must be combined with  clinical observations, patient history, and epidemiological information. The expected result is Negative. Fact Sheet for Patients: SugarRoll.be Fact Sheet for Healthcare Providers: https://www.woods-mathews.com/ This test is not yet approved or cleared by the [place] FDA and  has been authorized for detection and/or diagnosis of SARS-CoV-2 by FDA under an Emergency Use Authorization (EUA). This EUA will remain  in effect (meaning this test can be used) for the duration of the COVID-19 declaration under Section 56 4(b)(1) of the Act, 21 U.S.C. section 360bbb-3(b)(1), unless the authorization is terminated or revoked sooner. Performed at [facility], [place] [address], [place], [place] [address]          Radiology Studies: No results found.      Scheduled Meds: . amiodarone  400 mg Oral BID  . apixaban  5 mg Oral BID  . atorvastatin  40 mg Oral q1800  . calcitRIOL  0.75 mcg Oral Q M,W,F-HD  . Chlorhexidine Gluconate Cloth  6 each Topical Q0600  . colchicine  0.3 mg Oral Q T,Th,Sa-HD  . gabapentin  100 mg Oral QHS  . midodrine  10 mg Oral Once in dialysis  . midodrine  10 mg Oral TID WC  . multivitamin  1 tablet Oral QHS  . sucroferric oxyhydroxide  1,000 mg Oral TID WC & HS   Continuous Infusions: . sodium chloride Stopped (08/25/19 1705)     LOS: 3 days        Hosie Poisson, MD [facility] Pager [phone]  If 7PM-7AM, please contact night-coverage [email or web address] Password [facility] 08/29/2019, 5:32 PM

## 2019-08-30 DIAGNOSIS — I313 Pericardial effusion (noninflammatory): Secondary | ICD-10-CM

## 2019-08-30 DIAGNOSIS — I309 Acute pericarditis, unspecified: Principal | ICD-10-CM

## 2019-08-30 LAB — RENAL FUNCTION PANEL
Albumin: 2.6 g/dL — ABNORMAL LOW (ref 3.5–5.0)
Anion gap: 15 (ref 5–15)
BUN: 73 mg/dL — ABNORMAL HIGH (ref 8–23)
CO2: 26 mmol/L (ref 22–32)
Calcium: 8.7 mg/dL — ABNORMAL LOW (ref 8.9–10.3)
Chloride: 97 mmol/L — ABNORMAL LOW (ref 98–111)
Creatinine, Ser: 14.27 mg/dL — ABNORMAL HIGH (ref 0.44–1.00)
GFR calc Af Amer: 3 mL/min — ABNORMAL LOW (ref >60)
GFR calc non Af Amer: 2 mL/min — ABNORMAL LOW (ref >60)
Glucose, Bld: 175 mg/dL — ABNORMAL HIGH (ref 70–99)
Phosphorus: 2.7 mg/dL (ref 2.5–4.6)
Potassium: 5.2 mmol/L — ABNORMAL HIGH (ref 3.5–5.1)
Sodium: 138 mmol/L (ref 135–145)

## 2019-08-30 LAB — CBC
HCT: 31.9 % — ABNORMAL LOW (ref 36.0–46.0)
Hemoglobin: 10.3 g/dL — ABNORMAL LOW (ref 12.0–15.0)
MCH: 32.5 pg (ref 26.0–34.0)
MCHC: 32.3 g/dL (ref 30.0–36.0)
MCV: 100.6 fL — ABNORMAL HIGH (ref 80.0–100.0)
Platelets: 254 10*3/uL (ref 150–400)
RBC: 3.17 MIL/uL — ABNORMAL LOW (ref 3.87–5.11)
RDW: 15.7 % — ABNORMAL HIGH (ref 11.5–15.5)
WBC: 9.5 10*3/uL (ref 4.0–10.5)
nRBC: 0 % (ref 0.0–0.2)

## 2019-08-30 LAB — HEPATIC FUNCTION PANEL
ALT: 43 U/L (ref 0–44)
AST: 34 U/L (ref 15–41)
Albumin: 3.1 g/dL — ABNORMAL LOW (ref 3.5–5.0)
Alkaline Phosphatase: 133 U/L — ABNORMAL HIGH (ref 38–126)
Bilirubin, Direct: 0.1 mg/dL (ref 0.0–0.2)
Indirect Bilirubin: 0.3 mg/dL (ref 0.3–0.9)
Total Bilirubin: 0.4 mg/dL (ref 0.3–1.2)
Total Protein: 7.1 g/dL (ref 6.5–8.1)

## 2019-08-30 LAB — GLUCOSE, CAPILLARY
Glucose-Capillary: 95 mg/dL (ref 70–99)
Glucose-Capillary: 155 mg/dL — ABNORMAL HIGH (ref 70–99)
Glucose-Capillary: 207 mg/dL — ABNORMAL HIGH (ref 70–99)

## 2019-08-30 LAB — TSH: TSH: 1.026 u[IU]/mL (ref 0.350–4.500)

## 2019-08-30 MED ORDER — ALBUMIN HUMAN 25 % IV SOLN
12.5000 g | Freq: Once | INTRAVENOUS | Status: AC
  Administered 2019-08-30: 12.5 g via INTRAVENOUS

## 2019-08-30 MED ORDER — HEPARIN SODIUM (PORCINE) 1000 UNIT/ML DIALYSIS
5000.0000 [IU] | INTRAMUSCULAR | Status: DC | PRN
  Administered 2019-08-30: 5000 [IU] via INTRAVENOUS_CENTRAL

## 2019-08-30 MED ORDER — MIDODRINE HCL 5 MG PO TABS
ORAL_TABLET | ORAL | Status: AC
  Filled 2019-08-30: qty 2

## 2019-08-30 MED ORDER — COLCHICINE 0.6 MG PO TABS
0.3000 mg | ORAL_TABLET | ORAL | Status: DC
  Administered 2019-08-30 – 2019-09-08 (×5): 0.3 mg via ORAL
  Filled 2019-08-30 (×5): qty 0.5

## 2019-08-30 MED ORDER — ALBUMIN HUMAN 25 % IV SOLN
INTRAVENOUS | Status: AC
  Filled 2019-08-30: qty 50

## 2019-08-30 MED ORDER — CALCITRIOL 0.25 MCG PO CAPS
ORAL_CAPSULE | ORAL | Status: AC
  Filled 2019-08-30: qty 3

## 2019-08-30 MED ORDER — HEPARIN SODIUM (PORCINE) 1000 UNIT/ML IJ SOLN
INTRAMUSCULAR | Status: AC
  Filled 2019-08-30: qty 7

## 2019-08-30 MED ORDER — HEPARIN SODIUM (PORCINE) 1000 UNIT/ML DIALYSIS
20.0000 [IU]/kg | INTRAMUSCULAR | Status: DC | PRN
  Administered 2019-08-30: 2000 [IU] via INTRAVENOUS_CENTRAL

## 2019-08-30 MED ORDER — HEPARIN SODIUM (PORCINE) 1000 UNIT/ML DIALYSIS: 5000.0000 [IU] | Freq: Once | INTRAMUSCULAR | Status: DC

## 2019-08-30 NOTE — Progress Notes (Signed)
PROGRESS NOTE    Brenda Arnold  [id number] DOB: February 20, 1956 DOA: 08/25/2019 PCP: Kelton Pillar, MD    Brief Narrative:   63 year old lady with prior h/o type 2 DM, hypotension, on midodrine, ESRD on HD, CAD, OSA presents to ED FOR chest pain. Chest pain possibly pericarditis. She was started on colchicine on the days of dialysis and an echocardiogram shows moderate pericardial effusion without signs of tamponade.  She also went into atrial fibrillation with RVR for which she was started on IV amiodarone and transition to oral amiodarone. Patient seen and examined today and her blood pressures have been very low.  Assessment & Plan:   Principal Problem:   Chest pain Active Problems:   Type 2 diabetes mellitus with complication, without long-term current use of insulin (HCC)   OBESITY   OBESITY-MORBID (>100')   OSA (obstructive sleep apnea)   Hypotension   ESRD on hemodialysis (HCC)   CAD (coronary artery disease)   Tachycardia   Pericarditis     Chest pain possibly from ? Pericarditis. :  On colchicine on the days of HD.  If pain reoccurs, she will probably need steroids.  She reports chest pain post dialysis improved with pain medication.   Paroxysmal atrial fibrillation with RVR;  Rate control with IV amiodarone initially transitioned to oral amiodarone 400 mg twice daily for 1 week followed by 200 mg twice daily for 2 more weeks followed by 200 mg daily. Appreciate cardiology input.  Echocardiogram shows Left ventricular ejection fraction, by visual estimation, is 60 to 65%. The left ventricle has normal function. Normal left ventricular size. There is mildly increased left ventricular hypertrophy. Left ventricular diastolic Doppler parameters are consistent with impaired relaxation pattern of LV diastolic filling  Repeat echocardiogram shows moderate pericardial effusion without any signs of tamponade physiology.  On eliquis for anti coagulation.     ESRD  on HD; HD as per nephrology   Hypotension possibly from HD patient had 3 L of fluid taken out today.  Her blood pressures have been ranging around 80s over 40s.  Continue with midodrine.  And patient denies any dizziness at this time.  Recheck blood pressure in 1 hour after midodrine is given.   Anemia of chronic disease:  Hemoglobin stable between 10 to 11.   Diabetes Mellitus:  CBG (last 3)  Recent Labs    08/28/19 0811 08/30/19 1206 08/30/19 1521  GLUCAP 141* 95 155*   Continue with sliding scale insulin    Hyperlipidemia:  Resume statin.  No changes in medication   DVT prophylaxis: eliquis.  Code Status: full code Family Communication: none at bedside.  Disposition Plan: pending clinical improvement.  Possible discharge in the morning when blood pressure improves   Consultants:   Cardiology Dr Irish Lack   Nephrology Dr Carolin Sicks  PCCM Dr Ander Slade.    Procedures: echocardiogram.   Antimicrobials: none.   Subjective: Patient reports chest pain post dialysis which has improved with pain medication.  She denies dizziness but this reports being exhausted.  Objective: Vitals:   08/30/19 1000 08/30/19 1030 08/30/19 1100 08/30/19 1239  BP: 99/62 (!) 99/53 117/68 (!) 86/37  Pulse: 75 75 83 99  Resp:      Temp:    98.3 F (36.8 C)  TempSrc:    Oral  SpO2:    97%  Weight:      Height:        Intake/Output Summary (Last 24 hours) at 08/30/2019 1748 Last data filed at  08/30/2019 1300 Gross per 24 hour  Intake 480 ml  Output -  Net 480 ml   Filed Weights   08/29/19 0642 08/30/19 0630 08/30/19 0730  Weight: 102.4 kg 102.7 kg 104 kg    Examination:  General exam: Mild distress from chest pain Respiratory system: Air entry fair bilateral, no wheezing or rhonchi Cardiovascular system: S1-S2 heard irregularly irregular, no JVD gastrointestinal system: Abdomen is soft, nontender, nondistended, bowel sounds are good Central nervous system: Alert and oriented,  grossly nonfocal  extremities: No cyanosis or clubbing Skin: No rashes or ulcers Psychiatry: Mood is appropriate    Data Reviewed: I have personally reviewed following labs and imaging studies  CBC: Recent Labs  Lab 08/24/19 1747  08/26/19 0348 08/26/19 0859 08/27/19 0831 08/28/19 0256 08/30/19 0641  WBC 8.9   < > 9.4 9.1 8.2 8.0 9.5  NEUTROABS 6.5  --   --   --  5.4  --   --   HGB 13.1   < > 8.8* 12.3 11.0* 10.5* 10.3*  HCT 42.3   < > 28.3* 37.2 34.1* 32.1* 31.9*  MCV 103.4*   < > 103.3* 99.5 100.0 98.2 100.6*  PLT 147*   < > 152 154 202 200 254   < > = values in this interval not displayed.   Basic Metabolic Panel: Recent Labs  Lab 08/24/19 1747 08/24/19 1802 08/25/19 0931 08/26/19 0348 08/27/19 0830 08/30/19 0700  NA 137 137 135 135 136 138  K 4.6 4.4 4.6 4.6 4.5 5.2*  CL 93* 95* 91* 96* 96* 97*  CO2 29  --  27 25 28 26   GLUCOSE 93 86 80 72 74 175*  BUN 40* 42* 24* 40* 66* 73*  CREATININE 9.73* 9.90* 6.58* 8.74* 11.84* 14.27*  CALCIUM 8.0*  --  8.1* 7.3* 7.9* 8.7*  PHOS  --   --   --   --  3.3 2.7   GFR: Estimated Creatinine Clearance: 5 mL/min (A) (by C-G formula based on SCr of 14.27 mg/dL (H)). Liver Function Tests: Recent Labs  Lab 08/27/19 0830 08/30/19 0700 08/30/19 1250  AST  --   --  34  ALT  --   --  43  ALKPHOS  --   --  133*  BILITOT  --   --  0.4  PROT  --   --  7.1  ALBUMIN 2.9* 2.6* 3.1*   No results for input(s): LIPASE, AMYLASE in the last 168 hours. No results for input(s): AMMONIA in the last 168 hours. Coagulation Profile: No results for input(s): INR, PROTIME in the last 168 hours. Cardiac Enzymes: No results for input(s): CKTOTAL, CKMB, CKMBINDEX, TROPONINI in the last 168 hours. BNP (last 3 results) No results for input(s): PROBNP in the last 8760 hours. HbA1C: No results for input(s): HGBA1C in the last 72 hours. CBG: Recent Labs  Lab 08/27/19 1625 08/27/19 2134 08/28/19 0811 08/30/19 1206 08/30/19 1521  GLUCAP  135* 83 141* 95 155*   Lipid Profile: No results for input(s): CHOL, HDL, LDLCALC, TRIG, CHOLHDL, LDLDIRECT in the last 72 hours. Thyroid Function Tests: Recent Labs    08/30/19 1250  TSH 1.026   Anemia Panel: No results for input(s): VITAMINB12, FOLATE, FERRITIN, TIBC, IRON, RETICCTPCT in the last 72 hours. Sepsis Labs: Recent Labs  Lab 08/25/19 1012  LATICACIDVEN 1.6    Recent Results (from the past 240 hour(s))  SARS CORONAVIRUS 2 (TAT 6-24 HRS) Nasopharyngeal Nasopharyngeal Swab     Status: None  Collection Time: 08/24/19  5:47 PM   Specimen: Nasopharyngeal Swab  Result Value Ref Range Status   SARS Coronavirus 2 NEGATIVE NEGATIVE Final    Comment: (NOTE) SARS-CoV-2 target nucleic acids are NOT DETECTED. The SARS-CoV-2 RNA is generally detectable in upper and lower respiratory specimens during the acute phase of infection. Negative results do not preclude SARS-CoV-2 infection, do not rule out co-infections with other pathogens, and should not be used as the sole basis for treatment or other patient management decisions. Negative results must be combined with clinical observations, patient history, and epidemiological information. The expected result is Negative. Fact Sheet for Patients: SugarRoll.be Fact Sheet for Healthcare Providers: https://www.woods-mathews.com/ This test is not yet approved or cleared by the [place] FDA and  has been authorized for detection and/or diagnosis of SARS-CoV-2 by FDA under an Emergency Use Authorization (EUA). This EUA will remain  in effect (meaning this test can be used) for the duration of the COVID-19 declaration under Section 56 4(b)(1) of the Act, 21 U.S.C. section 360bbb-3(b)(1), unless the authorization is terminated or revoked sooner. Performed at [facility], [place] [address], [place], [place] [address]   SARS CORONAVIRUS 2 (TAT 6-24 HRS) Nasopharyngeal Nasopharyngeal  Swab     Status: None   Collection Time: 08/25/19 12:22 PM   Specimen: Nasopharyngeal Swab  Result Value Ref Range Status   SARS Coronavirus 2 NEGATIVE NEGATIVE Final    Comment: (NOTE) SARS-CoV-2 target nucleic acids are NOT DETECTED. The SARS-CoV-2 RNA is generally detectable in upper and lower respiratory specimens during the acute phase of infection. Negative results do not preclude SARS-CoV-2 infection, do not rule out co-infections with other pathogens, and should not be used as the sole basis for treatment or other patient management decisions. Negative results must be combined with clinical observations, patient history, and epidemiological information. The expected result is Negative. Fact Sheet for Patients: SugarRoll.be Fact Sheet for Healthcare Providers: https://www.woods-mathews.com/ This test is not yet approved or cleared by the [place] FDA and  has been authorized for detection and/or diagnosis of SARS-CoV-2 by FDA under an Emergency Use Authorization (EUA). This EUA will remain  in effect (meaning this test can be used) for the duration of the COVID-19 declaration under Section 56 4(b)(1) of the Act, 21 U.S.C. section 360bbb-3(b)(1), unless the authorization is terminated or revoked sooner. Performed at [facility], [place] [address] [place], [place] [address]          Radiology Studies: No results found.      Scheduled Meds: . amiodarone  400 mg Oral BID  . apixaban  5 mg Oral BID  . atorvastatin  40 mg Oral q1800  . calcitRIOL      . calcitRIOL  0.75 mcg Oral Q M,W,F-HD  . Chlorhexidine Gluconate Cloth  6 each Topical Q0600  . colchicine  0.3 mg Oral Q M,W,F-HD  . gabapentin  100 mg Oral QHS  . heparin      . midodrine  10 mg Oral Once in dialysis  . midodrine  10 mg Oral TID WC  . multivitamin  1 tablet Oral QHS  . sucroferric oxyhydroxide  1,000 mg Oral TID WC & HS   Continuous  Infusions: . albumin human    . sodium chloride Stopped (08/25/19 1705)     LOS: 4 days        Hosie Poisson, MD [facility] Pager [phone]  If 7PM-7AM, please contact night-coverage [email or web address] Password [facility] 08/30/2019, 5:48 PM

## 2019-08-30 NOTE — Progress Notes (Addendum)
[facility] Progress Note   Subjective:   Seen in HD unit. UF goal 3.5L and hypotensive on HD. Improved with midodrine/albumin  NSR on ECG this am.  Feels weak. Still with some discomfort on deep inspiration.   Objective Vitals:   08/30/19 0900 08/30/19 0930 08/30/19 0945 08/30/19 1000  BP: (!) 97/44 (!) 89/39 (!) 100/52 99/62  Pulse: 76 75 78 75  Resp:      Temp:      TempSrc:      SpO2:      Weight:      Height:       Physical Exam General: Well developed, well nourished female. Alert and in NAD Heart: RRR, no murmurs, rubs or gallops Lungs: CTA bilaterally without wheezing, rhonchi or rales Abdomen: Soft, non-tender, non-distended. +BS Extremities: No peripheral edema Dialysis Access:  L forearm AVF with two aneurysms, lightened skin over aneurysm with small area of erythema. +Bruit  Additional Objective Labs: Basic Metabolic Panel: Recent Labs  Lab 08/26/19 0348 08/27/19 0830 08/30/19 0700  NA 135 136 138  K 4.6 4.5 5.2*  CL 96* 96* 97*  CO2 25 28 26   GLUCOSE 72 74 175*  BUN 40* 66* 73*  CREATININE 8.74* 11.84* 14.27*  CALCIUM 7.3* 7.9* 8.7*  PHOS  --  3.3 2.7   Liver Function Tests: Recent Labs  Lab 08/27/19 0830 08/30/19 0700  ALBUMIN 2.9* 2.6*   CBC: Recent Labs  Lab 08/24/19 1747  08/26/19 0348 08/26/19 0859 08/27/19 0831 08/28/19 0256 08/30/19 0641  WBC 8.9   < > 9.4 9.1 8.2 8.0 9.5  NEUTROABS 6.5  --   --   --  5.4  --   --   HGB 13.1   < > 8.8* 12.3 11.0* 10.5* 10.3*  HCT 42.3   < > 28.3* 37.2 34.1* 32.1* 31.9*  MCV 103.4*   < > 103.3* 99.5 100.0 98.2 100.6*  PLT 147*   < > 152 154 202 200 254   < > = values in this interval not displayed.   Blood Culture    Component Value Date/Time   SDES URINE, RANDOM 11/05/2012 2355   SPECREQUEST NONE 11/05/2012 2355   CULT NO GROWTH 11/05/2012 2355   REPTSTATUS 11/07/2012 FINAL 11/05/2012 2355    CBG: Recent Labs  Lab 08/26/19 2111 08/27/19 1122 08/27/19 1625  08/27/19 2134 08/28/19 0811  GLUCAP 149* 138* 135* 83 141*   Medications: . albumin human    . sodium chloride Stopped (08/25/19 1705)   . amiodarone  400 mg Oral BID  . apixaban  5 mg Oral BID  . atorvastatin  40 mg Oral q1800  . calcitRIOL      . calcitRIOL  0.75 mcg Oral Q M,W,F-HD  . Chlorhexidine Gluconate Cloth  6 each Topical Q0600  . colchicine  0.3 mg Oral Q T,Th,Sa-HD  . gabapentin  100 mg Oral QHS  . heparin      . heparin  5,000 Units Dialysis Once in dialysis  . midodrine  10 mg Oral Once in dialysis  . midodrine  10 mg Oral TID WC  . multivitamin  1 tablet Oral QHS  . sucroferric oxyhydroxide  1,000 mg Oral TID WC & HS    Dialysis Orders: [facility] MWF 4H 200Re BFR 450 EDW 99kg 2K/2Ca Heparin 10000 Access AVF Parsabiv 15 Calcitriol 0.75 Recent OP Labs: Hgb 12.2 K 4.7 Ca 8.3 Phos 3.9 PTH 1960  Assessment/Plan: 1. Atypical chest pain:Concern for pericarditis.On colchicine.Hs-troponin peaked  at 12. EKG with diffuse ST elevation. Echo showed LVEF of 60-65%, normal RV function.CP significantly improved. Cardiology following 2. PAfib:On Eliquis. Tachycardic events  with CP during last 2 treatments. . Intermittent tachycardia since 9/24 (PAF vs SVT vs atrial tachycardia). Converted to NSR on IV amio this weekend >>PO amio per cardiology.  3. ESRD:HD MWF.HD on schedule today. Aneurysms evaluated by Dr. Donnetta Hutching, reports it is safe to use access and to avoid areas of superficial blistering. Appreciate his assistance.  4. Hypertension/volume:BP soft. On midodrine for BP support, increased to TID and BP now stable.Not to EDW past two dialysis sessions due to hypotension/tachycardia as above. HR and BP now stabilized, UF goal 3.5L today.  5. Anemia:Hgb 10.3. Not on ESA. Follow.  6. Metabolic bone disease: Continue bindersand calcitriol. On Parasbivfor 2HPTH- not availablein hospital. Resume at discharge.   Lynnda Child PA-C [facility] Pager  [phone] 08/30/2019,10:58 AM  Pt seen, examined and agree w A/P as above.  Kelly Splinter  MD 08/30/2019, 11:43 AM

## 2019-08-30 NOTE — Progress Notes (Addendum)
Progress Note  Patient Name: Brenda Arnold Date of Encounter: 08/30/2019  Primary Cardiologist: Fransico Him, MD   Subjective   Patient seen in HD. Denies recurrent chest pain since receiving dilaudid yesterday.   Inpatient Medications    Scheduled Meds: . amiodarone  400 mg Oral BID  . apixaban  5 mg Oral BID  . atorvastatin  40 mg Oral q1800  . calcitRIOL  0.75 mcg Oral Q M,W,F-HD  . Chlorhexidine Gluconate Cloth  6 each Topical Q0600  . colchicine  0.3 mg Oral Q T,Th,Sa-HD  . gabapentin  100 mg Oral QHS  . heparin  5,000 Units Dialysis Once in dialysis  . midodrine  10 mg Oral Once in dialysis  . midodrine  10 mg Oral TID WC  . multivitamin  1 tablet Oral QHS  . sucroferric oxyhydroxide  1,000 mg Oral TID WC & HS   Continuous Infusions: . sodium chloride Stopped (08/25/19 1705)   PRN Meds: acetaminophen **OR** acetaminophen, heparin, heparin, HYDROmorphone (DILAUDID) injection, menthol-cetylpyridinium, morphine injection, ondansetron (ZOFRAN) IV   Vital Signs    Vitals:   08/29/19 1603 08/29/19 1643 08/29/19 2030 08/30/19 0630  BP: (!) 152/46 (!) 167/51 (!) 123/52 (!) 137/47  Pulse:   86 81  Resp:   13 12  Temp:   99.1 F (37.3 C) 99.4 F (37.4 C)  TempSrc:   Oral Oral  SpO2:   100% 100%  Weight:    102.7 kg  Height:        Intake/Output Summary (Last 24 hours) at 08/30/2019 0729 Last data filed at 08/30/2019 0654 Gross per 24 hour  Intake 480 ml  Output -  Net 480 ml   Last 3 Weights 08/30/2019 08/29/2019 08/28/2019  Weight (lbs) 226 lb 8 oz 225 lb 11.2 oz 223 lb 1.6 oz  Weight (kg) 102.74 kg 102.377 kg 101.197 kg      Telemetry    NSR, rates in the 80s, no other arrhythmias noted - Personally Reviewed  ECG    No new - Personally Reviewed  Physical Exam   GEN: No acute distress.   Neck: No JVD Cardiac: RRR, no murmurs, rubs, or gallops; chest wall TTP Respiratory: Clear to auscultation bilaterally. GI: Soft, nontender, non-distended   MS: No edema; No deformity. Neuro:  Nonfocal  Psych: Normal affect   Labs    High Sensitivity Troponin:   Recent Labs  Lab 08/24/19 1747 08/24/19 2022 08/25/19 0931 08/25/19 1124  TROPONINIHS _0 Chemistry Recent Labs  Lab 08/25/19 0931 08/26/19 0348 08/27/19 0830  NA 135 135 136  K 4.6 4.6 4.5  CL 91* 96* 96*  CO2 _1 GLUCOSE 80 72 74  BUN 24* 40* 66*  CREATININE 6.58* 8.74* 11.84*  CALCIUM 8.1* 7.3* 7.9*  ALBUMIN  --   --  2.9*  GFRNONAA 6* 4* 3*  GFRAA 7* 5* 4*  ANIONGAP 17* 14 12     Hematology Recent Labs  Lab 08/26/19 0859 08/27/19 0831 08/28/19 0256  WBC 9.1 8.2 8.0  RBC 3.74* 3.41* 3.27*  HGB 12.3 11.0* 10.5*  HCT 37.2 34.1* 32.1*  MCV 99.5 100.0 98.2  MCH 32.9 32.3 32.1  MCHC 33.1 32.3 32.7  RDW 15.7* 15.6* 15.6*  PLT 154 202 200    BNPNo results for input(s): BNP, PROBNP in the last 168 hours.   DDimer No results for input(s): DDIMER in the last 168 hours.   Radiology  No results found.  Cardiac Studies   Limited Echo 08/28/19 1. Left ventricular ejection fraction, by visual estimation, is 60 to 65%. The left ventricle has normal function. Normal left ventricular size. There is no left ventricular hypertrophy.  2. Global right ventricle has normal systolic function.The right ventricular size is normal. No increase in right ventricular wall thickness.  3. Left atrial size was normal.  4. Right atrial size was normal.  5. Moderate pericardial effusion.  6. The pericardial effusion is circumferential.  7. The mitral valve is normal in structure. No evidence of mitral valve regurgitation. No evidence of mitral stenosis.  8. The tricuspid valve is normal in structure. Tricuspid valve regurgitation is trivial.  9. The aortic valve is normal in structure. Aortic valve regurgitation was not visualized by color flow Doppler. Structurally normal aortic valve, with no evidence of sclerosis or stenosis. 10. The pulmonic valve  was normal in structure. Pulmonic valve regurgitation is not visualized by color flow Doppler. 11. Normal pulmonary artery systolic pressure. 12. The inferior vena cava is normal in size with greater than 50% respiratory variability, suggesting right atrial pressure of 3 mmHg. 13. Compared to 08/26/2019, the pericardial effusion appears larger, but there is no evidence of tamponade.  Echo 08/26/19 1. Left ventricular ejection fraction, by visual estimation, is 60 to 65%. The left ventricle has normal function. Normal left ventricular size. There is mildly increased left ventricular hypertrophy.  2. Left ventricular diastolic Doppler parameters are consistent with impaired relaxation pattern of LV diastolic filling.  3. The aortic valve is tricuspid Aortic valve regurgitation was not visualized by color flow Doppler. Mild aortic valve sclerosis without stenosis.  4. Left atrial size was normal.  5. Right atrial size was normal.  6. Global right ventricle has normal systolic function.The right ventricular size is normal. No increase in right ventricular wall thickness.  7. Trivial pericardial effusion is present.  8. The tricuspid valve is normal in structure. Tricuspid valve regurgitation was not visualized by color flow Doppler.  9. The mitral valve is normal in structure. No evidence of mitral valve regurgitation. No evidence of mitral stenosis. 10. TR signal is inadequate for assessing pulmonary artery systolic pressure. 11. The inferior vena cava is normal in size with greater than 50% respiratory variability, suggesting right atrial pressure of 3 mmHg.  Patient Profile     63 y.o. female with a hx of DM2, HTN, ESRD on HD, paroxysmal Afib, OSA, obesity, chest pain s/p MV 2011, myoview 04/2017, w/o ischemia who is being seen for chest pain and abnormal EKG.    Assessment & Plan    Chest pain/ ?Pericarditis  - Patient was admitted last week for chest pain. HS troponin were negative  - EKG  showed diffuse ST elevation >> suspected pericarditis - Patient was started on ibuprofen, but later switched to colchicine on HD days (renally dosed per pharmacy) for bleeding risk - ESR 65, CRP 123 - ?MSK chest pain>> over the weekend CP resolved with Dilaudid and chest wall TTP  - Echo was repeated over the weekend for tachycardia (afib RVR) and hypotension (likely due to post-HD). Repeat limited Echo EF 60-65%, G1DD, and moderate pericardial effusion with no signs of tamponade - Patient denies recurrent chest pain since receiving dilaudid.  - Plan to continue colchicine x 3 months. Will defer to MD whether to add steroids for treatment - Patient will need limited echo 1 week after discharge with close f/u  Paroxysmal Afib - Eliquis for chronic  a/c - Patient converted to NSR over the weekend on IV amio  - Was on IV>> now PO 400 mg daily for 1 week and then 200 mg daily for 2 weeks.  - Maintaining NSR with controlled rates - Echo stable - Will check baseline TSH and Liver panel  Pericardial effusion - Echo was repeated this admission for issues with hypotension and afib RVR - Echo showed moderate pericardial effusion, no signs of tamponade - HD today  ESRD on HD - h/o of hypotension following HD often requiring midodrine. Not on antihypertensives at baseline - HD today  Hyperlipidemia - Lipitor 40 mg daily reportedly not taking>> patient does not want to take a statin; she prefers diet changes - LDL 115 - Spoke to the patient and she will continue with diet changes  DM2 - A1C 4.9 - diet controlled  For questions or updates, please contact [facility] Please consult [email or web address] for contact info under        Signed, Cadence Ninfa Meeker, PA-C  08/30/2019, 7:29 AM    Patient seen and examined.  Agree with above documentation.  Denies chest pain.  On exam, patient is regular rate and rhythm, no murmurs, rubs, or gallops, lungs are clear to auscultation bilaterally, no edema,  no JVD.  Plan for treatment of pericarditis with colchicine and will monitor pericardial effusion for progression.  She has remained in sinus rhythm, will continue amiodarone for rhythm control.

## 2019-08-30 NOTE — Progress Notes (Signed)
Pt came back from dialysis with CP and dizziness. Pt had been up in the bathroom. BP 86/37 HR 118. Midodrine given as well as tylenol for pain. Cont to monitor. Carroll Kinds RN

## 2019-08-30 NOTE — Discharge Instructions (Signed)
Information on my medicine - ELIQUIS (apixaban)  Why was Eliquis prescribed for you? Eliquis was prescribed to treat blood clots that may have been found in the veins of your legs (deep vein thrombosis) or in your lungs (pulmonary embolism) and due to your history of atrial fibrillation.   What do You need to know about Eliquis ? Take ONE 5 mg tablet taken TWICE daily.  Eliquis may be taken with or without food.   Try to take the dose about the same time in the morning and in the evening. If you have difficulty swallowing the tablet whole please discuss with your pharmacist how to take the medication safely.  Take Eliquis exactly as prescribed and DO NOT stop taking Eliquis without talking to the doctor who prescribed the medication.  Stopping may increase your risk of developing a new blood clot.  Refill your prescription before you run out.  After discharge, you should have regular check-up appointments with your healthcare provider that is prescribing your Eliquis.    What do you do if you miss a dose? If a dose of ELIQUIS is not taken at the scheduled time, take it as soon as possible on the same day and twice-daily administration should be resumed. The dose should not be doubled to make up for a missed dose.  Important Safety Information A possible side effect of Eliquis is bleeding. You should call your healthcare provider right away if you experience any of the following: ? Bleeding from an injury or your nose that does not stop. ? Unusual colored urine (red or dark brown) or unusual colored stools (red or black). ? Unusual bruising for unknown reasons. ? A serious fall or if you hit your head (even if there is no bleeding).  Some medicines may interact with Eliquis and might increase your risk of bleeding or clotting while on Eliquis. To help avoid this, consult your healthcare provider or pharmacist prior to using any new prescription or non-prescription medications,  including herbals, vitamins, non-steroidal anti-inflammatory drugs (NSAIDs) and supplements.  This website has more information on Eliquis (apixaban): http://www.eliquis.com/eliquis/home

## 2019-08-30 NOTE — TOC Initial Note (Signed)
Transition of Care [facility]) - Initial/Assessment Note    Patient Details  Name: Brenda Arnold MRN: [id number] Date of Birth: 06-08-1956  [facility]) CM/SW Contact:    Bethena Roys, RN Phone Number: 08/30/2019, 4:39 PM  Clinical Narrative:  Pt presented for Chest Pain and Atrial Fib.  Hx of ESRD on HD MWF Schedule. PTA from home alone. Patient states all family is in [place]. Patient has friends, however all of them work. Patient works from home and she still drives. [facility] and has PCP. Patient wants to go home and be able to return back to work. She works from home. CM did ask RN to have MD place PT/OT note in for recommendations. CM will follow for transition of care needs.                Expected Discharge Plan: [place] Barriers to Discharge: Continued Medical Work up   Patient Goals and CMS Choice Patient states their goals for this hospitalization and ongoing recovery are:: "to be able to return home to do my work" [employer].gov Compare Post Acute Care list provided to:: Patient Choice offered to / list presented to : Patient  Expected Discharge Plan and Services Expected Discharge Plan: [place] In-house Referral: NA Discharge Planning Services: CM Consult Post Acute Care Choice: [place] arrangements for the past 2 months: Single Family Home                  Prior Living Arrangements/Services Living arrangements for the past 2 months: Single Family Home Lives with:: Self(all family in [place].) Patient language and need for interpreter reviewed:: Yes Do you feel safe going back to the place where you live?: Yes      Need for Family Participation in Patient Care: No (Comment) Care giver support system in place?: No (comment)   Criminal Activity/Legal Involvement Pertinent to Current Situation/Hospitalization: No - Comment as needed  Activities of  Daily Living Home Assistive Devices/Equipment: Eyeglasses, Cane (specify quad or straight), Walker (specify type) ADL Screening (condition at time of admission) Patient's cognitive ability adequate to safely complete daily activities?: Yes Is the patient deaf or have difficulty hearing?: No Does the patient have difficulty seeing, even when wearing glasses/contacts?: No Does the patient have difficulty concentrating, remembering, or making decisions?: No Patient able to express need for assistance with ADLs?: Yes Does the patient have difficulty dressing or bathing?: No Independently performs ADLs?: Yes (appropriate for developmental age) Does the patient have difficulty walking or climbing stairs?: Yes Weakness of Legs: Both Weakness of Arms/Hands: None  Permission Sought/Granted                  Emotional Assessment Appearance:: Appears stated age Attitude/Demeanor/Rapport: Engaged Affect (typically observed): Accepting Orientation: : Oriented to Situation, Oriented to  Time, Oriented to Place, Oriented to Self Alcohol / Substance Use: Not Applicable Psych Involvement: No (comment)  Admission diagnosis:  Hypotension, unspecified hypotension type [I95.9] Chest pain, unspecified type [R07.9] Patient Active Problem List   Diagnosis Date Noted  . Chest pain at rest 08/26/2019  . Tachycardia 08/26/2019  . Pericarditis 08/26/2019  . Angina pectoris, unspecified ([employer]) 08/25/2019  . CAD (coronary artery disease) 11/05/2018  . A-fib ([place]) 11/05/2018  . Atrial fibrillation with RVR ([place])   . Hypotension 11/04/2018  . ESRD on hemodialysis ([place]) 11/04/2018  . DVT (deep venous thrombosis) ([place]) 11/03/2018  .  Primary osteoarthritis of right hip 09/15/2018  . Status post total replacement of right hip 09/15/2018  . Varicose veins of bilateral lower extremities with other complications 0000000  . Anticoagulation goal of INR 2 to 3 09/30/2014  . Pain in lower limb 05/02/2014  .  Onychomycosis due to dermatophyte 04/06/2013  . Pain in joint, ankle and foot 04/06/2013  . Bradycardia 11/06/2012  . Left-sided epistaxis 11/05/2012  . DM (diabetes mellitus) ([place]) 11/05/2012  . OSA (obstructive sleep apnea) 11/05/2012  . Acute posterior epistaxis 11/05/2012  . CKD (chronic kidney disease) 11/05/2012  . End stage renal disease ([place]) 06/25/2012  . Type 2 diabetes mellitus with complication, without long-term current use of insulin ([place]) 12/20/2009  . OBESITY 12/20/2009  . OBESITY-MORBID (>100') 12/20/2009  . Essential hypertension 12/20/2009  . Paroxysmal atrial fibrillation ([place]) 12/20/2009  . Chest pain 12/20/2009  . ABNORMAL CV (STRESS) TEST 12/20/2009   PCP:  Kelton Pillar, MD [id number] [address] [place] [address] Phone: [phone] Fax: [phone]  [facility] [place] [place] - [address] [place] [address] Phone: [phone] Fax: [phone]     Social Determinants of Health (SDOH) Interventions    Readmission Risk Interventions Readmission Risk Prevention Plan 08/30/2019  Transportation Screening Complete  PCP or Specialist Appt within 3-5 Days Complete  HRI or [place] Complete  Social Work Consult for [place] Planning/Counseling Complete  Palliative Care Screening Not Applicable  Medication Review Press photographer) Complete  Some recent data might be hidden

## 2019-08-31 ENCOUNTER — Encounter (HOSPITAL_COMMUNITY)
Admission: EM | Disposition: A | Discharge status: Rehabilitation Facility | Payer: Self-pay | Attending: Internal Medicine

## 2019-08-31 ENCOUNTER — Inpatient Hospital Stay (HOSPITAL_COMMUNITY): Payer: BC Managed Care – PPO | Admitting: Anesthesiology

## 2019-08-31 ENCOUNTER — Encounter (HOSPITAL_COMMUNITY): Payer: Self-pay | Admitting: Certified Registered Nurse Anesthetist

## 2019-08-31 ENCOUNTER — Encounter (HOSPITAL_COMMUNITY): Payer: Self-pay | Admitting: Anesthesiology

## 2019-08-31 ENCOUNTER — Inpatient Hospital Stay (HOSPITAL_COMMUNITY): Payer: BC Managed Care – PPO

## 2019-08-31 DIAGNOSIS — R079 Chest pain, unspecified: Secondary | ICD-10-CM

## 2019-08-31 DIAGNOSIS — I313 Pericardial effusion (noninflammatory): Secondary | ICD-10-CM

## 2019-08-31 DIAGNOSIS — I25119 Atherosclerotic heart disease of native coronary artery with unspecified angina pectoris: Secondary | ICD-10-CM

## 2019-08-31 DIAGNOSIS — I314 Cardiac tamponade: Secondary | ICD-10-CM

## 2019-08-31 HISTORY — PX: VIDEO ASSISTED THORACOSCOPY (VATS)/WEDGE RESECTION: SHX6174

## 2019-08-31 LAB — PHOSPHORUS: Phosphorus: 3.5 mg/dL (ref 2.5–4.6)

## 2019-08-31 LAB — GLUCOSE, CAPILLARY
Glucose-Capillary: 124 mg/dL — ABNORMAL HIGH (ref 70–99)
Glucose-Capillary: 102 mg/dL — ABNORMAL HIGH (ref 70–99)
Glucose-Capillary: 112 mg/dL — ABNORMAL HIGH (ref 70–99)
Glucose-Capillary: 107 mg/dL — ABNORMAL HIGH (ref 70–99)

## 2019-08-31 LAB — CBC
HCT: 31 % — ABNORMAL LOW (ref 36.0–46.0)
HCT: 30.4 % — ABNORMAL LOW (ref 36.0–46.0)
Hemoglobin: 9.9 g/dL — ABNORMAL LOW (ref 12.0–15.0)
Hemoglobin: 9.6 g/dL — ABNORMAL LOW (ref 12.0–15.0)
MCH: 32.7 pg (ref 26.0–34.0)
MCH: 32.4 pg (ref 26.0–34.0)
MCHC: 31.9 g/dL (ref 30.0–36.0)
MCHC: 31.6 g/dL (ref 30.0–36.0)
MCV: 102.3 fL — ABNORMAL HIGH (ref 80.0–100.0)
MCV: 102.7 fL — ABNORMAL HIGH (ref 80.0–100.0)
Platelets: 313 10*3/uL (ref 150–400)
Platelets: 260 10*3/uL (ref 150–400)
RBC: 3.03 MIL/uL — ABNORMAL LOW (ref 3.87–5.11)
RBC: 2.96 MIL/uL — ABNORMAL LOW (ref 3.87–5.11)
RDW: 15.9 % — ABNORMAL HIGH (ref 11.5–15.5)
RDW: 15.9 % — ABNORMAL HIGH (ref 11.5–15.5)
WBC: 9.5 10*3/uL (ref 4.0–10.5)
WBC: 8 10*3/uL (ref 4.0–10.5)
nRBC: 0 % (ref 0.0–0.2)
nRBC: 0 % (ref 0.0–0.2)

## 2019-08-31 LAB — ECHOCARDIOGRAM LIMITED
Height: 66 in
Weight: 3552.05 oz

## 2019-08-31 LAB — BASIC METABOLIC PANEL
Anion gap: 14 (ref 5–15)
BUN: 37 mg/dL — ABNORMAL HIGH (ref 8–23)
CO2: 27 mmol/L (ref 22–32)
Calcium: 8.7 mg/dL — ABNORMAL LOW (ref 8.9–10.3)
Chloride: 97 mmol/L — ABNORMAL LOW (ref 98–111)
Creatinine, Ser: 9.54 mg/dL — ABNORMAL HIGH (ref 0.44–1.00)
GFR calc Af Amer: 5 mL/min — ABNORMAL LOW (ref >60)
GFR calc non Af Amer: 4 mL/min — ABNORMAL LOW (ref >60)
Glucose, Bld: 118 mg/dL — ABNORMAL HIGH (ref 70–99)
Potassium: 5.3 mmol/L — ABNORMAL HIGH (ref 3.5–5.1)
Sodium: 138 mmol/L (ref 135–145)

## 2019-08-31 LAB — SURGICAL PCR SCREEN
MRSA, PCR: NEGATIVE
Staphylococcus aureus: NEGATIVE

## 2019-08-31 LAB — MAGNESIUM: Magnesium: 2.1 mg/dL (ref 1.7–2.4)

## 2019-08-31 SURGERY — PERICARDIOCENTESIS
Anesthesia: LOCAL

## 2019-08-31 SURGERY — VIDEO ASSISTED THORACOSCOPY (VATS)/WEDGE RESECTION
Anesthesia: General | Site: Chest | Laterality: Right

## 2019-08-31 MED ORDER — PHENYLEPHRINE 40 MCG/ML (10ML) SYRINGE FOR IV PUSH (FOR BLOOD PRESSURE SUPPORT)
PREFILLED_SYRINGE | INTRAVENOUS | Status: DC | PRN
  Administered 2019-08-31 (×2): 80 ug via INTRAVENOUS

## 2019-08-31 MED ORDER — LIDOCAINE 2% (20 MG/ML) 5 ML SYRINGE
INTRAMUSCULAR | Status: AC
  Filled 2019-08-31: qty 5

## 2019-08-31 MED ORDER — HYDROMORPHONE HCL 1 MG/ML IJ SOLN
INTRAMUSCULAR | Status: AC
  Filled 2019-08-31: qty 1

## 2019-08-31 MED ORDER — SODIUM CHLORIDE 0.9 % IV SOLN
INTRAVENOUS | Status: DC | PRN
  Administered 2019-08-31: 13:00:00 50 ug/min via INTRAVENOUS

## 2019-08-31 MED ORDER — ETOMIDATE 2 MG/ML IV SOLN
INTRAVENOUS | Status: AC
  Filled 2019-08-31: qty 10

## 2019-08-31 MED ORDER — 0.9 % SODIUM CHLORIDE (POUR BTL) OPTIME
TOPICAL | Status: DC | PRN
  Administered 2019-08-31: 2000 mL

## 2019-08-31 MED ORDER — LIDOCAINE 2% (20 MG/ML) 5 ML SYRINGE
INTRAMUSCULAR | Status: DC | PRN
  Administered 2019-08-31: 60 mg via INTRAVENOUS

## 2019-08-31 MED ORDER — BUPIVACAINE HCL (PF) 0.5 % IJ SOLN
INTRAMUSCULAR | Status: AC
  Filled 2019-08-31: qty 30

## 2019-08-31 MED ORDER — ONDANSETRON HCL 4 MG/2ML IJ SOLN
INTRAMUSCULAR | Status: DC | PRN
  Administered 2019-08-31: 4 mg via INTRAVENOUS

## 2019-08-31 MED ORDER — EPINEPHRINE 1 MG/10ML IJ SOSY
PREFILLED_SYRINGE | INTRAMUSCULAR | Status: AC
  Filled 2019-08-31: qty 10

## 2019-08-31 MED ORDER — MIDAZOLAM HCL 2 MG/2ML IJ SOLN
INTRAMUSCULAR | Status: AC
  Filled 2019-08-31: qty 2

## 2019-08-31 MED ORDER — SODIUM CHLORIDE 0.9 % IV SOLN: INTRAVENOUS | Status: DC | PRN

## 2019-08-31 MED ORDER — ROCURONIUM BROMIDE 10 MG/ML (PF) SYRINGE
PREFILLED_SYRINGE | INTRAVENOUS | Status: DC | PRN
  Administered 2019-08-31: 60 mg via INTRAVENOUS

## 2019-08-31 MED ORDER — CALCIUM CHLORIDE 10 % IV SOLN
INTRAVENOUS | Status: DC | PRN
  Administered 2019-08-31 (×2): 200 mg via INTRAVENOUS

## 2019-08-31 MED ORDER — ALBUMIN HUMAN 5 % IV SOLN
12.5000 g | Freq: Once | INTRAVENOUS | Status: AC
  Administered 2019-08-31: 12.5 g via INTRAVENOUS

## 2019-08-31 MED ORDER — BUPIVACAINE HCL 0.5 % IJ SOLN
INTRAMUSCULAR | Status: DC | PRN
  Administered 2019-08-31: 20 mL

## 2019-08-31 MED ORDER — ALBUMIN HUMAN 5 % IV SOLN
INTRAVENOUS | Status: AC
  Filled 2019-08-31: qty 250

## 2019-08-31 MED ORDER — PROTHROMBIN COMPLEX CONC HUMAN 500 UNITS IV KIT: 5000.0000 [IU] | PACK | Status: DC

## 2019-08-31 MED ORDER — SUCCINYLCHOLINE CHLORIDE 200 MG/10ML IV SOSY
PREFILLED_SYRINGE | INTRAVENOUS | Status: AC
  Filled 2019-08-31: qty 10

## 2019-08-31 MED ORDER — TRAMADOL HCL 50 MG PO TABS
50.0000 mg | ORAL_TABLET | Freq: Two times a day (BID) | ORAL | Status: DC | PRN
  Administered 2019-08-31 – 2019-09-03 (×4): 50 mg via ORAL
  Filled 2019-08-31 (×4): qty 1

## 2019-08-31 MED ORDER — NOREPINEPHRINE BITARTRATE 1 MG/ML IV SOLN
INTRAVENOUS | Status: DC | PRN
  Administered 2019-08-31: 14:00:00 2 ug/min via INTRAVENOUS

## 2019-08-31 MED ORDER — CEFAZOLIN SODIUM-DEXTROSE 1-4 GM/50ML-% IV SOLN
1.0000 g | INTRAVENOUS | Status: AC
  Administered 2019-09-01: 1 g via INTRAVENOUS
  Filled 2019-08-31: qty 50

## 2019-08-31 MED ORDER — CEFAZOLIN SODIUM-DEXTROSE 2-4 GM/100ML-% IV SOLN
INTRAVENOUS | Status: AC
  Filled 2019-08-31: qty 100

## 2019-08-31 MED ORDER — HYDROMORPHONE HCL 1 MG/ML IJ SOLN
0.2500 mg | INTRAMUSCULAR | Status: DC | PRN
  Administered 2019-08-31: 0.25 mg via INTRAVENOUS

## 2019-08-31 MED ORDER — SODIUM CHLORIDE 0.9 % IV BOLUS
500.0000 mL | Freq: Once | INTRAVENOUS | Status: AC
  Administered 2019-08-31: 500 mL via INTRAVENOUS

## 2019-08-31 MED ORDER — FENTANYL CITRATE (PF) 250 MCG/5ML IJ SOLN
INTRAMUSCULAR | Status: AC
  Filled 2019-08-31: qty 5

## 2019-08-31 MED ORDER — SENNOSIDES-DOCUSATE SODIUM 8.6-50 MG PO TABS
2.0000 | ORAL_TABLET | Freq: Every evening | ORAL | Status: DC | PRN
  Administered 2019-08-31: 2 via ORAL
  Filled 2019-08-31 (×2): qty 2

## 2019-08-31 MED ORDER — CEFAZOLIN SODIUM-DEXTROSE 2-4 GM/100ML-% IV SOLN
2.0000 g | Freq: Once | INTRAVENOUS | Status: AC
  Administered 2019-08-31: 13:00:00 2 g via INTRAVENOUS

## 2019-08-31 MED ORDER — ONDANSETRON HCL 4 MG/2ML IJ SOLN
INTRAMUSCULAR | Status: AC
  Filled 2019-08-31: qty 2

## 2019-08-31 MED ORDER — SODIUM CHLORIDE 0.9 % IV SOLN
INTRAVENOUS | Status: DC | PRN
  Administered 2019-08-31 (×2): via INTRAVENOUS

## 2019-08-31 MED ORDER — PROPOFOL 10 MG/ML IV BOLUS
INTRAVENOUS | Status: AC
  Filled 2019-08-31: qty 20

## 2019-08-31 MED ORDER — MUPIROCIN 2 % EX OINT: 1.0000 "application " | TOPICAL_OINTMENT | Freq: Two times a day (BID) | CUTANEOUS | Status: DC

## 2019-08-31 MED ORDER — PHENYLEPHRINE 40 MCG/ML (10ML) SYRINGE FOR IV PUSH (FOR BLOOD PRESSURE SUPPORT)
PREFILLED_SYRINGE | INTRAVENOUS | Status: AC
  Filled 2019-08-31: qty 30

## 2019-08-31 MED ORDER — SODIUM CHLORIDE 0.9 % IV SOLN
INTRAVENOUS | Status: DC
  Administered 2019-08-31 – 2019-09-01 (×2): via INTRAVENOUS

## 2019-08-31 MED ORDER — FENTANYL CITRATE (PF) 100 MCG/2ML IJ SOLN
INTRAMUSCULAR | Status: DC | PRN
  Administered 2019-08-31 (×2): 50 ug via INTRAVENOUS

## 2019-08-31 MED ORDER — SODIUM CHLORIDE 0.9 % IV SOLN
Freq: Once | INTRAVENOUS | Status: AC
  Administered 2019-08-31: 500 mL via INTRAVENOUS

## 2019-08-31 MED ORDER — ORAL CARE MOUTH RINSE
15.0000 mL | Freq: Two times a day (BID) | OROMUCOSAL | Status: DC
  Administered 2019-08-31 – 2019-09-09 (×17): 15 mL via OROMUCOSAL

## 2019-08-31 MED ORDER — SUGAMMADEX SODIUM 200 MG/2ML IV SOLN
INTRAVENOUS | Status: DC | PRN
  Administered 2019-08-31: 200 mg via INTRAVENOUS

## 2019-08-31 MED ORDER — ROCURONIUM BROMIDE 10 MG/ML (PF) SYRINGE
PREFILLED_SYRINGE | INTRAVENOUS | Status: AC
  Filled 2019-08-31: qty 10

## 2019-08-31 SURGICAL SUPPLY — 107 items
ADH SKN CLS APL DERMABOND .7 (GAUZE/BANDAGES/DRESSINGS) ×1
APPLIER CLIP ROT 10 11.4 M/L (STAPLE)
APR CLP MED LRG 11.4X10 (STAPLE)
BAG SPEC RTRVL LRG 6X4 10 (ENDOMECHANICALS)
BLADE CLIPPER SURG (BLADE) ×1 IMPLANT
BLADE SURG 11 STRL SS (BLADE) ×1 IMPLANT
CANISTER SUCT 3000ML PPV (MISCELLANEOUS) ×2 IMPLANT
CATH ROBINSON RED A/P 14FR (CATHETERS) IMPLANT
CATH THORACIC 28FR (CATHETERS) IMPLANT
CATH THORACIC 28FR RT ANG (CATHETERS) IMPLANT
CATH THORACIC 36FR (CATHETERS) IMPLANT
CATH THORACIC 36FR RT ANG (CATHETERS) IMPLANT
CATH TROCAR 20FR (CATHETERS) IMPLANT
CLIP APPLIE ROT 10 11.4 M/L (STAPLE) IMPLANT
CLIP VESOCCLUDE MED 6/CT (CLIP) ×1 IMPLANT
CONN ST 1/4X3/8  BEN (MISCELLANEOUS)
CONN ST 1/4X3/8 BEN (MISCELLANEOUS) IMPLANT
CONN Y 3/8X3/8X3/8  BEN (MISCELLANEOUS)
CONN Y 3/8X3/8X3/8 BEN (MISCELLANEOUS) IMPLANT
CONT SPEC 4OZ CLIKSEAL STRL BL (MISCELLANEOUS) ×6 IMPLANT
COVER SURGICAL LIGHT HANDLE (MISCELLANEOUS) IMPLANT
COVER WAND RF STERILE (DRAPES) ×1 IMPLANT
DEFOGGER ANTIFOG KIT (MISCELLANEOUS) ×1 IMPLANT
DEFOGGER SCOPE WARMER CLEARIFY (MISCELLANEOUS) ×1 IMPLANT
DERMABOND ADVANCED (GAUZE/BANDAGES/DRESSINGS) ×1
DERMABOND ADVANCED .7 DNX12 (GAUZE/BANDAGES/DRESSINGS) IMPLANT
DISSECTOR BLUNT TIP ENDO 5MM (MISCELLANEOUS) IMPLANT
DRAIN CHANNEL 28F RND 3/8 FF (WOUND CARE) IMPLANT
DRAIN CHANNEL 32F RND 10.7 FF (WOUND CARE) IMPLANT
DRAIN CONNECTOR BLAKE 1:1 (MISCELLANEOUS) ×1 IMPLANT
DRAPE SLUSH/WARMER DISC (DRAPES) ×1 IMPLANT
DRAPE WARM FLUID 44X44 (DRAPES) ×1 IMPLANT
ELECT BLADE 6.5 EXT (BLADE) ×2 IMPLANT
ELECT REM PT RETURN 9FT ADLT (ELECTROSURGICAL) ×2
ELECTRODE REM PT RTRN 9FT ADLT (ELECTROSURGICAL) ×1 IMPLANT
EVACUATOR SILICONE 100CC (DRAIN) ×1 IMPLANT
GAUZE SPONGE 4X4 12PLY STRL (GAUZE/BANDAGES/DRESSINGS) ×2 IMPLANT
GLOVE BIO SURGEON STRL SZ7 (GLOVE) ×4 IMPLANT
GLOVE BIOGEL PI IND STRL 6 (GLOVE) IMPLANT
GLOVE BIOGEL PI INDICATOR 6 (GLOVE) ×1
GOWN STRL REUS W/ TWL LRG LVL3 (GOWN DISPOSABLE) ×2 IMPLANT
GOWN STRL REUS W/ TWL XL LVL3 (GOWN DISPOSABLE) ×1 IMPLANT
GOWN STRL REUS W/TWL LRG LVL3 (GOWN DISPOSABLE) ×12
GOWN STRL REUS W/TWL XL LVL3 (GOWN DISPOSABLE) ×2
HANDLE STAPLE ENDO GIA SHORT (STAPLE)
HEMOSTAT SURGICEL 2X14 (HEMOSTASIS) IMPLANT
KIT BASIN OR (CUSTOM PROCEDURE TRAY) ×2 IMPLANT
KIT SUCTION CATH 14FR (SUCTIONS) ×1 IMPLANT
KIT TURNOVER KIT B (KITS) ×2 IMPLANT
NDL SPNL 18GX3.5 QUINCKE PK (NEEDLE) IMPLANT
NEEDLE 22X1 1/2 (OR ONLY) (NEEDLE) ×2 IMPLANT
NEEDLE SPNL 18GX3.5 QUINCKE PK (NEEDLE) IMPLANT
NS IRRIG 1000ML POUR BTL (IV SOLUTION) ×5 IMPLANT
PACK CHEST (CUSTOM PROCEDURE TRAY) ×2 IMPLANT
PACK UNIVERSAL I (CUSTOM PROCEDURE TRAY) ×2 IMPLANT
PAD ARMBOARD 7.5X6 YLW CONV (MISCELLANEOUS) ×2 IMPLANT
PERIVAC PERICARDIOCENTESIS 8.3 (TRAY / TRAY PROCEDURE) ×1 IMPLANT
POUCH ENDO CATCH II 15MM (MISCELLANEOUS) IMPLANT
POUCH SPECIMEN RETRIEVAL 10MM (ENDOMECHANICALS) IMPLANT
SCISSORS ENDO CVD 5DCS (MISCELLANEOUS) ×1 IMPLANT
SCISSORS LAP 5X35 DISP (ENDOMECHANICALS) ×2 IMPLANT
SEALANT PROGEL (MISCELLANEOUS) IMPLANT
SEALANT SURG COSEAL 4ML (VASCULAR PRODUCTS) IMPLANT
SEALANT SURG COSEAL 8ML (VASCULAR PRODUCTS) IMPLANT
SEALER LIGASURE MARYLAND 30 (ELECTROSURGICAL) ×2 IMPLANT
SET IRRIG TUBING LAPAROSCOPIC (IRRIGATION / IRRIGATOR) IMPLANT
SOL ANTI FOG 6CC (MISCELLANEOUS) ×1 IMPLANT
SOLUTION ANTI FOG 6CC (MISCELLANEOUS) ×1
SPECIMEN JAR MEDIUM (MISCELLANEOUS) IMPLANT
SPONGE INTESTINAL PEANUT (DISPOSABLE) ×2 IMPLANT
SPONGE TONSIL TAPE 1 RFD (DISPOSABLE) ×1 IMPLANT
STAPLER ENDO GIA 12 SHRT THIN (STAPLE) ×1 IMPLANT
STAPLER ENDO GIA 12MM SHORT (STAPLE) IMPLANT
STOPCOCK 4 WAY LG BORE MALE ST (IV SETS) ×1 IMPLANT
SUT MNCRL AB 3-0 PS2 18 (SUTURE) IMPLANT
SUT MON AB 2-0 CT1 36 (SUTURE) IMPLANT
SUT PDS AB 1 CTX 36 (SUTURE) IMPLANT
SUT PROLENE 4 0 RB 1 (SUTURE)
SUT PROLENE 4-0 RB1 .5 CRCL 36 (SUTURE) IMPLANT
SUT SILK  1 MH (SUTURE) ×1
SUT SILK 1 MH (SUTURE) IMPLANT
SUT SILK 1 TIES 10X30 (SUTURE) ×1 IMPLANT
SUT SILK 2 0 SH (SUTURE) IMPLANT
SUT SILK 2 0 SH CR/8 (SUTURE) ×1 IMPLANT
SUT SILK 2 0SH CR/8 30 (SUTURE) IMPLANT
SUT VIC AB 1 CTX 36 (SUTURE)
SUT VIC AB 1 CTX36XBRD ANBCTR (SUTURE) IMPLANT
SUT VIC AB 2-0 CT1 27 (SUTURE) ×4
SUT VIC AB 2-0 CT1 TAPERPNT 27 (SUTURE) ×2 IMPLANT
SUT VIC AB 3-0 SH 27 (SUTURE) ×2
SUT VIC AB 3-0 SH 27X BRD (SUTURE) ×1 IMPLANT
SUT VICRYL 0 UR6 27IN ABS (SUTURE) ×2 IMPLANT
SUT VICRYL 2 TP 1 (SUTURE) IMPLANT
SYR 10ML LL (SYRINGE) ×1 IMPLANT
SYR 30ML LL (SYRINGE) ×1 IMPLANT
SYSTEM SAHARA CHEST DRAIN ATS (WOUND CARE) ×2 IMPLANT
TAPE CLOTH 4X10 WHT NS (GAUZE/BANDAGES/DRESSINGS) ×1 IMPLANT
TIP APPLICATOR SPRAY EXTEND 16 (VASCULAR PRODUCTS) IMPLANT
TOWEL GREEN STERILE (TOWEL DISPOSABLE) ×2 IMPLANT
TOWEL GREEN STERILE FF (TOWEL DISPOSABLE) ×2 IMPLANT
TRAP SPECIMEN MUCOUS 40CC (MISCELLANEOUS) ×2 IMPLANT
TRAY FOLEY MTR SLVR 16FR STAT (SET/KITS/TRAYS/PACK) ×1 IMPLANT
TROCAR XCEL BLADELESS 5X75MML (TROCAR) ×3 IMPLANT
TROCAR XCEL NON-BLD 5MMX100MML (ENDOMECHANICALS) ×2 IMPLANT
TUBING EXTENTION W/L.L. (IV SETS) ×2 IMPLANT
TUBING LAP HI FLOW INSUFFLATIO (TUBING) ×1 IMPLANT
WATER STERILE IRR 1000ML POUR (IV SOLUTION) ×2 IMPLANT

## 2019-08-31 NOTE — Op Note (Signed)
      [address]       [place] [address]             [phone]        08/31/2019  Patient:  Brenda Arnold Pre-Op Dx:  pericardial effusion   Tamponade physiology   Post-op Dx:  same Procedure: - Aborted pericardial drain - Right Video assisted thoracoscopy - Pericardial window   Surgeon and Role:      * Jenika Chiem, Lucile Crater, MD - Primary    Josie Saunders, PAC - assisting  EBL:  Minimal  Blood Administration: none Specimen:  Pericardial fluid, pericardium  Drains: 84 F argyle chest tube in right chest Counts: correct   Indications: 63 year old female with history of end-stage renal disease was admitted with chest pain and hypotension to the hospital.  She underwent sequential echocardiograms which showed an enlarging pericardial effusion and today she was noted to be severely hypotensive with tamponade physiology on the echocardiogram.  Findings: The pericardiocentesis was aborted due to patient anxiety.  She tolerated induction without problems thus we entered the right chest and will release the pericardial effusion.  The fluid was thin and bloody.  There was also fibrinous exudate within the pericardial sac.  Operative Technique: After the risks, benefits and alternatives were thoroughly discussed, the patient was brought to the operative theatre.  Anesthesia was induced. The patient was then placed in a lazy lateral decubitus position and was prepped and draped in normal sterile fashion.  An appropriate surgical pause was performed, and pre-operative antibiotics were dosed accordingly.  A 1 cm incision was made in the midaxillary line and the chest was entered with a 5 mm trocar.  The chest was then insufflated.  We then placed another 5 mm trocar and 10 mm trocar to triangulate the pericardium.  Small hole was made in the pericardium to release the effluent and her blood pressure rose significantly.  We then performed a 3 x 3 cm pericardial  window.  A 19 [place] Blake drain was then passed through the 10 mm trocar into the pericardial space.  The effluent was aspirated and collected in Luken's traps.  At this point the the insufflation was released and the lung expanded nicely.  The other incisions were closed with absorbable suture.  The patient tolerated the procedure without any immediate complications, and was transferred to the PACU in stable condition.  Lydiana Milley Bary Leriche

## 2019-08-31 NOTE — Consult Note (Signed)
[address]       [place] [address]             [phone]        Brenda Arnold Akins Medical Record [id number] Date of Birth: 1956-01-05  Referring: No ref. provider found Primary Care: Kelton Pillar, MD Primary Cardiologist:Traci Radford Pax, MD  Chief Complaint:    Chief Complaint  Patient presents with  . Chest Pain    History of Present Illness:     63 yo female with ESRD, and hx of DVT, presented to the hospital with chest pain on 9/23.  She underwent sequential echos, which showed an enlarging pericardial effusion. Today's echo showed a large effusion and concern for pericarditis.  She has a hx of hypotension, and has been on midodrine, but with her pressures today, CTS has been consulted to assist with management   Past Medical and Surgical History: Previous Chest Surgery: no Previous Chest Radiation: no Diabetes Mellitus: yes.  HbA1C ? Creatinine: 14.27  Past Medical History:  Diagnosis Date  . Anemia   . Arthritis   . Asthma   . Complication of anesthesia    difficulty with getting oxygen saturation up  . Diabetes mellitus   . Dyslipidemia   . Epistaxis 11/05/2012  . ESRD (end stage renal disease) on dialysis [facility])    "MWF; [facility] Rinks" (09/15/2018)  . History of nuclear stress test    Myoview 5/18: EF 68, no infarct or ischemia, low risk  . HTN (hypertension)   . Hyperlipidemia   . Hyperparathyroidism due to renal insufficiency ([place])   . Obesity    s/p panniculectomy  . Paroxysmal A-fib (H[facility])    a. chronic coumadin;  b. 12/2009 Echo: EF 60-65%, Gr 1 DD.  Marland Kitchen PCOS (polycystic ovarian syndrome)   . Pericarditis 08/2019  . Pneumonia   . Seasonal allergies   . Sleep apnea    a. not using CPAP, last study  >8 yrs  . Vitamin D deficiency     Past Surgical History:  Procedure Laterality Date  . ABDOMINAL HYSTERECTOMY     with panniculctomy  . AV FISTULA PLACEMENT  09/02/2012   Procedure: ARTERIOVENOUS (AV) FISTULA  CREATION;  Surgeon: Elam Dutch, MD;  Location: [facility] OR;  Service: Vascular;  Laterality: Left;  Creation of Left Radial-Cephalic Fistula   . BREAST SURGERY     Biopsy right breast  . COLONOSCOPY W/ BIOPSIES AND POLYPECTOMY    . DILATION AND CURETTAGE OF UTERUS    . KNEE ARTHROSCOPY Right   . REVISON OF ARTERIOVENOUS FISTULA Left 04/27/2014   Procedure: REVISON OF LEFT RADIAL-CEPHALIC ARTERIOVENOUS FISTULA;  Surgeon: Mal Misty, MD;  Location: [facility];  Service: Vascular;  Laterality: Left;  . TOTAL HIP ARTHROPLASTY Right 09/15/2018  . TOTAL HIP ARTHROPLASTY Right 09/15/2018   Procedure: RIGHT TOTAL HIP ARTHROPLASTY ANTERIOR APPROACH;  Surgeon: Leandrew Koyanagi, MD;  Location: [facility];  Service: Orthopedics;  Laterality: Right;  . UVULOPLASTY       Social History   Tobacco Use  Smoking Status Never Smoker  Smokeless Tobacco Never Used    Social History   Substance and Sexual Activity  Alcohol Use No  . Alcohol/week: 0.0 standard drinks     Allergies  Allergen Reactions  . Iodine Shortness Of Breath  . Shellfish Allergy Shortness Of Breath  . Norvasc [Amlodipine] Swelling    SWELLING REACTION UNSPECIFIED     Medications: Asprin: yes Statin:  yes Beta Blocker: no Ace Inhibitor: no Anti-Coagulation: eliquis  Current Facility-Administered Medications  Medication Dose Route Frequency Provider Last Rate Last Dose  . acetaminophen (TYLENOL) tablet 650 mg  650 mg Oral Q6H PRN Hosie Poisson, MD   650 mg at 08/30/19 1950   Or  . acetaminophen (TYLENOL) suppository 650 mg  650 mg Rectal Q6H PRN Hosie Poisson, MD      . amiodarone (PACERONE) tablet 400 mg  400 mg Oral BID Hosie Poisson, MD   400 mg at 08/30/19 2127  . atorvastatin (LIPITOR) tablet 40 mg  40 mg Oral q1800 Hosie Poisson, MD   40 mg at 08/29/19 1718  . calcitRIOL (ROCALTROL) capsule 0.75 mcg  0.75 mcg Oral Q M,W,F-HD Hosie Poisson, MD   0.75 mcg at 08/30/19 0945  . Chlorhexidine Gluconate Cloth 2 % PADS 6 each  6  each Topical Q0600 Hosie Poisson, MD   6 each at 08/31/19 1057  . colchicine tablet 0.3 mg  0.3 mg Oral Q M,W,F-HD Hosie Poisson, MD   0.3 mg at 08/30/19 1337  . gabapentin (NEURONTIN) capsule 100 mg  100 mg Oral QHS Hosie Poisson, MD   100 mg at 08/30/19 2127  . HYDROmorphone (DILAUDID) injection 0.5-1 mg  0.5-1 mg Intravenous Q6H PRN Hosie Poisson, MD   1 mg at 08/29/19 1530  . menthol-cetylpyridinium (CEPACOL) lozenge 3 mg  1 lozenge Oral PRN Hosie Poisson, MD   3 mg at 08/29/19 1203  . midodrine (PROAMATINE) tablet 10 mg  10 mg Oral Once in dialysis Hosie Poisson, MD   Stopped at 08/27/19 0919  . midodrine (PROAMATINE) tablet 10 mg  10 mg Oral TID WC Hosie Poisson, MD   10 mg at 08/31/19 0742  . morphine 2 MG/ML injection 1 mg  1 mg Intravenous Q4H PRN Hosie Poisson, MD   1 mg at 08/27/19 0946  . multivitamin (RENA-VIT) tablet 1 tablet  1 tablet Oral QHS Hosie Poisson, MD   1 tablet at 08/30/19 2127  . mupirocin ointment (BACTROBAN) 2 % 1 application  1 application Nasal BID Claudia Desanctis, MD      . ondansetron [facility] injection 4 mg  4 mg Intravenous Q6H PRN Hosie Poisson, MD   4 mg at 08/29/19 2023  . senna-docusate (Senokot-S) tablet 2 tablet  2 tablet Oral QHS PRN Hosie Poisson, MD   2 tablet at 08/31/19 0037  . sodium chloride 0.9 % bolus 500 mL  500 mL Intravenous Once Hosie Poisson, MD   Stopped at 08/25/19 1705  . sucroferric oxyhydroxide (VELPHORO) chewable tablet 1,000 mg  1,000 mg Oral TID WC & HS Hosie Poisson, MD   1,000 mg at 08/30/19 2128    Medications Prior to Admission  Medication Sig Dispense Refill Last Dose  . aspirin EC 81 MG tablet Take 1 tablet (81 mg total) by mouth 2 (two) times daily. (Patient not taking: Reported on 08/25/2019) 84 tablet 0   . atorvastatin (LIPITOR) 40 MG tablet Take 1 tablet (40 mg total) by mouth daily at 6 PM. (Patient not taking: Reported on 08/25/2019) 30 tablet 1   . bisacodyl (DULCOLAX) 10 MG suppository Place 10 mg rectally as needed for  moderate constipation.     . calcitRIOL (ROCALTROL) 0.5 MCG capsule Take 3 capsules (1.5 mcg total) by mouth 3 (three) times a week. (Patient not taking: Reported on 08/25/2019) 90 capsule 0   . ELIQUIS 5 MG TABS tablet Take 5 mg by mouth 2 (two) times daily.      Marland Kitchen  ethyl chloride spray Apply 1 application topically See admin instructions. For dialysis  6   . gabapentin (NEURONTIN) 100 MG capsule Take 100 mg by mouth at bedtime.  3   . methocarbamol (ROBAXIN) 500 MG tablet Take 1 tablet (500 mg total) by mouth every 6 (six) hours as needed for muscle spasms. (Patient not taking: Reported on 08/25/2019) 30 tablet 2   . ondansetron (ZOFRAN) 4 MG tablet Take 1-2 tablets (4-8 mg total) by mouth every 8 (eight) hours as needed for nausea or vomiting. (Patient not taking: Reported on 08/25/2019) 40 tablet 0   . oxyCODONE (OXY IR/ROXICODONE) 5 MG immediate release tablet Take 1-3 tablets (5-15 mg total) by mouth every 4 (four) hours as needed. (Patient not taking: Reported on 08/25/2019) 30 tablet 0   . polycarbophil (FIBERCON) 625 MG tablet Take 625 mg by mouth daily.     . promethazine (PHENERGAN) 25 MG tablet Take 1 tablet (25 mg total) by mouth every 6 (six) hours as needed for nausea. (Patient not taking: Reported on 08/25/2019) 30 tablet 1   . senna-docusate (SENOKOT S) 8.6-50 MG tablet Take 1 tablet by mouth at bedtime as needed. (Patient not taking: Reported on 08/25/2019) 30 tablet 1   . sucroferric oxyhydroxide (VELPHORO) 500 MG chewable tablet Chew 2 capsules by mouth 4 (four) times daily -  with meals and at bedtime.        Family History  Problem Relation Age of Onset  . Lung cancer Father        died @ 57  . Hypertension Mother        alive @ 62  . Diabetes Brother   . Kidney disease Brother      Review of Systems:   Review of Systems  Constitutional: Negative.   HENT: Negative.   Respiratory: Positive for shortness of breath.   Cardiovascular: Positive for chest pain.   Gastrointestinal: Positive for heartburn.  Musculoskeletal: Positive for joint pain.  Skin: Negative.   Neurological: Negative.       Physical Exam: BP (!) 85/47   Pulse (!) 42   Temp 99.1 F (37.3 C) (Oral)   Resp 20   Ht 5\' 6"  (1.676 m)   Wt 100.7 kg   SpO2 95%   BMI 35.83 kg/m  Physical Exam  Constitutional: She is oriented to person, place, and time. No distress.  Non-toxic appearing Able to lay flat  HENT:  Head: Normocephalic and atraumatic.  Eyes: Conjunctivae and EOM are normal. No scleral icterus.  Neck: Normal range of motion. No tracheal deviation present.  Cardiovascular: Normal rate and regular rhythm.  Pulmonary/Chest: Effort normal. No respiratory distress.  Abdominal: Soft. She exhibits no distension.  Neurological: She is alert and oriented to person, place, and time.  Skin: Skin is warm and dry. She is not diaphoretic.      Diagnostic Studies & Laboratory data:     Echo: IMPRESSIONS    1. Left ventricular ejection fraction, by visual estimation, is 55 to 60%. The left ventricle has normal function. There is no left ventricular hypertrophy.  2. Global right ventricle was not well visualized.The right ventricular size is not well visualized. Right vetricular wall thickness was not assessed.  3. Left atrial size was normal.  4. Right atrial size was not well visualized.  5. Large pericardial effusion.  6. The mitral valve was not assessed. Not assessed mitral valve regurgitation.  7. The tricuspid valve is not assessed. Tricuspid valve regurgitation was not assessed  by color flow Doppler.  8. The aortic valve was not assessed Aortic valve regurgitation was not assessed by color flow Doppler.  9. The pulmonic valve was not well visualized. Pulmonic valve regurgitation was not assessed by color flow Doppler. 10. Aortic root could not be assessed. 11. Limited study for pericardial effusion; large pericardial effusion; no RV diastolic collapse; IVC  dilated; cannot R/O early tamponade; echo is larger compared to 08/28/19. 12. The interatrial septum was not assessed.   I have independently reviewed the above radiologic studies and discussed with the patient   Recent Lab Findings: Lab Results  Component Value Date   WBC 9.5 08/31/2019   HGB 9.9 (L) 08/31/2019   HCT 31.0 (L) 08/31/2019   PLT 313 08/31/2019   GLUCOSE 175 (H) 08/30/2019   CHOL 175 08/27/2019   TRIG 83 08/27/2019   HDL 43 08/27/2019   LDLCALC 115 (H) 08/27/2019   ALT 43 08/30/2019   AST 34 08/30/2019   NA 138 08/30/2019   K 5.2 (H) 08/30/2019   CL 97 (L) 08/30/2019   CREATININE 14.27 (H) 08/30/2019   BUN 73 (H) 08/30/2019   CO2 26 08/30/2019   TSH 1.026 08/30/2019   INR 1.97 11/09/2018   HGBA1C 4.9 07/21/2018      Assessment / Plan:   64 yo female with large pericardial effusion, and concern for tamponade.  She received a dose of eliquis yesterday.  Have ordered a TEG To the OR for awake pericardiocentesis, followed by R VATS, pericardial window.     I  spent 30 minutes counseling the patient face to face.   Lajuana Matte 08/31/2019 12:08 PM

## 2019-08-31 NOTE — Anesthesia Procedure Notes (Addendum)
Procedure Name: Intubation Date/Time: 08/31/2019 1:41 PM Performed by: Babs Bertin, CRNA Pre-anesthesia Checklist: Patient identified, Emergency Drugs available, Suction available and Patient being monitored Patient Re-evaluated:Patient Re-evaluated prior to induction Oxygen Delivery Method: Circle System Utilized Preoxygenation: Pre-oxygenation with 100% oxygen Induction Type: IV induction Ventilation: Mask ventilation without difficulty Laryngoscope Size: Mac and 4 Grade View: Grade I Tube type: Oral Tube size: 8.0 mm Number of attempts: 1 Airway Equipment and Method: Stylet and Oral airway Placement Confirmation: ETT inserted through vocal cords under direct vision,  positive ETCO2 and breath sounds checked- equal and bilateral Secured at: 21 cm Tube secured with: Tape Dental Injury: Teeth and Oropharynx as per pre-operative assessment

## 2019-08-31 NOTE — Anesthesia Procedure Notes (Signed)
Central Venous Catheter Insertion Performed by: Albertha Ghee, MD, anesthesiologist Start/End9/29/2020 12:40 PM, 08/31/2019 12:54 PM Patient location: Pre-op. Preanesthetic checklist: patient identified, IV checked, site marked, risks and benefits discussed, surgical consent, monitors and equipment checked, pre-op evaluation, timeout performed and anesthesia consent Lidocaine 1% used for infiltration and patient sedated Hand hygiene performed  and maximum sterile barriers used  Catheter size: 8 Fr Total catheter length 16. Central line was placed.Double lumen Procedure performed using ultrasound guided technique. Ultrasound Notes:image(s) printed for medical record Attempts: 1 Following insertion, dressing applied and line sutured. Post procedure assessment: blood return through all ports  Patient tolerated the procedure well with no immediate complications.

## 2019-08-31 NOTE — Progress Notes (Signed)
PROGRESS NOTE    MACALA PROKOSCH  [id number] DOB: March 03, 1956 DOA: 08/25/2019 PCP: Kelton Pillar, MD    Brief Narrative:   63 year old lady with prior h/o type 2 DM, hypotension, on midodrine, ESRD on HD, CAD, OSA presents to ED FOR chest pain. Chest pain possibly pericarditis. She was started on colchicine on the days of dialysis and an echocardiogram shows moderate pericardial effusion without signs of tamponade.  She also went into atrial fibrillation with RVR for which she was started on IV amiodarone and transition to oral amiodarone. Patient seen and examined today  Overnight she had been hypotensive with bp 70/40's, responded to fluid boluses.   She is lethargic, but answering questions appropriately.   Assessment & Plan:   Principal Problem:   Chest pain Active Problems:   Type 2 diabetes mellitus with complication, without long-term current use of insulin (HCC)   OBESITY   OBESITY-MORBID (>100')   OSA (obstructive sleep apnea)   Hypotension   ESRD on hemodialysis (HCC)   CAD (coronary artery disease)   Tachycardia   Pericarditis     Chest pain possibly from acute  Pericarditis. :  On colchicine on the days of HD. Her pain is not well controlled. She will probably need steroids on discharge.    Large Pericardial Effusion with possible early tamponade physiology: Echocardiogram on 08/26/19  shows Left ventricular ejection fraction, by visual estimation, is 60 to 65%. The left ventricle has normal function. Normal left ventricular size. There is mildly increased left ventricular hypertrophy. Left ventricular diastolic Doppler parameters are consistent with impaired relaxation pattern of LV diastolic filling  Repeat echocardiogram on 9/26  shows moderate pericardial effusion without any signs of tamponade physiology.  Repeat echocardiogram on 9/29 shows large effusion with possible early tamponade physiology.  Evident on repeat stat Echocardiogram this morning.   Transfer the patient to 2 H ICU under PCCM service. Hold the eliquis and plan for pericardiocentesis later today.  Discussed with Cardiology.    Paroxysmal atrial fibrillation with RVR;  Rate control with IV amiodarone initially transitioned to oral amiodarone 400 mg twice daily for 1 week followed by 200 mg twice daily for 2 more weeks followed by 200 mg daily. Appreciate cardiology input. She was on Eliquis for anti coagulation , her last dose was last night and it is on hold for now for possible pericardiocentesis.     ESRD on HD; HD as per nephrology   Hypotension possibly from HD patient had 3 L of fluid taken out  Yesterday.  500 ml bolus given last night with some improvement another 500 ml ordered this am. Continue with Midodrine.  Keep Map >60. Transfer the patient to ICU, discussed with Dr Valeta Harms.    Anemia of chronic disease:  Hemoglobin stable between 10 to 11.   Diabetes Mellitus:  CBG (last 3)  Recent Labs    08/30/19 2110 08/31/19 0437 08/31/19 0743  GLUCAP 207* 124* 102*   Continue with sliding scale insulin, no changes in meds.     Hyperlipidemia:  Resume statin.   DVT prophylaxis: eliquis.  Code Status: full code Family Communication: none at bedside.  Disposition Plan: transfer to ICU.    Consultants:   Cardiology Dr Irish Lack   Nephrology Dr Carolin Sicks  PCCM Dr Ander Slade. / Valeta Harms.   Procedures: Echocardiogram.   Antimicrobials: none.   Subjective: Lethargic, but answering questions appropriately.   Objective: Vitals:   08/31/19 0509 08/31/19 0516 08/31/19 0528 08/31/19 0541  BP: Marland Kitchen)  103/58 (!) 79/50 (!) 80/40 (!) 86/54  Pulse:  (!) 42    Resp:      Temp:      TempSrc:      SpO2:  93%    Weight:      Height:        Intake/Output Summary (Last 24 hours) at 08/31/2019 0947 Last data filed at 08/31/2019 0500 Gross per 24 hour  Intake 1220 ml  Output 2500 ml  Net -1280 ml   Filed Weights   08/30/19 0630 08/30/19 0730 08/30/19  1120  Weight: 102.7 kg 104 kg 100.7 kg    Examination:  General exam: alert and comfortable on 2 lit of Bier oxygen.  Respiratory system: diminished air entry at bases, no wheezing or rhonchi.  Cardiovascular system: s1s2, irregularly irregular. No pedal edema.   gastrointestinal system: abdomen is soft, non tender non distended bowel sounds good.   Central nervous system: Alert and answering questions appropriately, grossly non foca.  extremities: no pedal edema, cyanosis or clubbing.  Skin: no ulcers seen.  Psychiatry: mood is appropriate.     Data Reviewed: I have personally reviewed following labs and imaging studies  CBC: Recent Labs  Lab 08/24/19 1747  08/26/19 0348 08/26/19 0859 08/27/19 0831 08/28/19 0256 08/30/19 0641  WBC 8.9   < > 9.4 9.1 8.2 8.0 9.5  NEUTROABS 6.5  --   --   --  5.4  --   --   HGB 13.1   < > 8.8* 12.3 11.0* 10.5* 10.3*  HCT 42.3   < > 28.3* 37.2 34.1* 32.1* 31.9*  MCV 103.4*   < > 103.3* 99.5 100.0 98.2 100.6*  PLT 147*   < > 152 154 202 200 254   < > = values in this interval not displayed.   Basic Metabolic Panel: Recent Labs  Lab 08/24/19 1747 08/24/19 1802 08/25/19 0931 08/26/19 0348 08/27/19 0830 08/30/19 0700  NA 137 137 135 135 136 138  K 4.6 4.4 4.6 4.6 4.5 5.2*  CL 93* 95* 91* 96* 96* 97*  CO2 29  --  27 25 28 26   GLUCOSE 93 86 80 72 74 175*  BUN 40* 42* 24* 40* 66* 73*  CREATININE 9.73* 9.90* 6.58* 8.74* 11.84* 14.27*  CALCIUM 8.0*  --  8.1* 7.3* 7.9* 8.7*  PHOS  --   --   --   --  3.3 2.7   GFR: Estimated Creatinine Clearance: 4.9 mL/min (A) (by C-G formula based on SCr of 14.27 mg/dL (H)). Liver Function Tests: Recent Labs  Lab 08/27/19 0830 08/30/19 0700 08/30/19 1250  AST  --   --  34  ALT  --   --  43  ALKPHOS  --   --  133*  BILITOT  --   --  0.4  PROT  --   --  7.1  ALBUMIN 2.9* 2.6* 3.1*   No results for input(s): LIPASE, AMYLASE in the last 168 hours. No results for input(s): AMMONIA in the last 168  hours. Coagulation Profile: No results for input(s): INR, PROTIME in the last 168 hours. Cardiac Enzymes: No results for input(s): CKTOTAL, CKMB, CKMBINDEX, TROPONINI in the last 168 hours. BNP (last 3 results) No results for input(s): PROBNP in the last 8760 hours. HbA1C: No results for input(s): HGBA1C in the last 72 hours. CBG: Recent Labs  Lab 08/30/19 1206 08/30/19 1521 08/30/19 2110 08/31/19 0437 08/31/19 0743  GLUCAP 95 155* 207* 124* 102*   Lipid Profile:  No results for input(s): CHOL, HDL, LDLCALC, TRIG, CHOLHDL, LDLDIRECT in the last 72 hours. Thyroid Function Tests: Recent Labs    08/30/19 1250  TSH 1.026   Anemia Panel: No results for input(s): VITAMINB12, FOLATE, FERRITIN, TIBC, IRON, RETICCTPCT in the last 72 hours. Sepsis Labs: Recent Labs  Lab 08/25/19 1012  LATICACIDVEN 1.6    Recent Results (from the past 240 hour(s))  SARS CORONAVIRUS 2 (TAT 6-24 HRS) Nasopharyngeal Nasopharyngeal Swab     Status: None   Collection Time: 08/24/19  5:47 PM   Specimen: Nasopharyngeal Swab  Result Value Ref Range Status   SARS Coronavirus 2 NEGATIVE NEGATIVE Final    Comment: (NOTE) SARS-CoV-2 target nucleic acids are NOT DETECTED. The SARS-CoV-2 RNA is generally detectable in upper and lower respiratory specimens during the acute phase of infection. Negative results do not preclude SARS-CoV-2 infection, do not rule out co-infections with other pathogens, and should not be used as the sole basis for treatment or other patient management decisions. Negative results must be combined with clinical observations, patient history, and epidemiological information. The expected result is Negative. Fact Sheet for Patients: SugarRoll.be Fact Sheet for Healthcare Providers: https://www.woods-mathews.com/ This test is not yet approved or cleared by the [place] FDA and  has been authorized for detection and/or diagnosis of  SARS-CoV-2 by FDA under an Emergency Use Authorization (EUA). This EUA will remain  in effect (meaning this test can be used) for the duration of the COVID-19 declaration under Section 56 4(b)(1) of the Act, 21 U.S.C. section 360bbb-3(b)(1), unless the authorization is terminated or revoked sooner. Performed at [facility], [place] [address], [place], [place] [address]   SARS CORONAVIRUS 2 (TAT 6-24 HRS) Nasopharyngeal Nasopharyngeal Swab     Status: None   Collection Time: 08/25/19 12:22 PM   Specimen: Nasopharyngeal Swab  Result Value Ref Range Status   SARS Coronavirus 2 NEGATIVE NEGATIVE Final    Comment: (NOTE) SARS-CoV-2 target nucleic acids are NOT DETECTED. The SARS-CoV-2 RNA is generally detectable in upper and lower respiratory specimens during the acute phase of infection. Negative results do not preclude SARS-CoV-2 infection, do not rule out co-infections with other pathogens, and should not be used as the sole basis for treatment or other patient management decisions. Negative results must be combined with clinical observations, patient history, and epidemiological information. The expected result is Negative. Fact Sheet for Patients: SugarRoll.be Fact Sheet for Healthcare Providers: https://www.woods-mathews.com/ This test is not yet approved or cleared by the [place] FDA and  has been authorized for detection and/or diagnosis of SARS-CoV-2 by FDA under an Emergency Use Authorization (EUA). This EUA will remain  in effect (meaning this test can be used) for the duration of the COVID-19 declaration under Section 56 4(b)(1) of the Act, 21 U.S.C. section 360bbb-3(b)(1), unless the authorization is terminated or revoked sooner. Performed at [facility], [place] [address] [place], [place] [address]          Radiology Studies: No results found.      Scheduled Meds: . amiodarone  400 mg Oral BID  .  atorvastatin  40 mg Oral q1800  . calcitRIOL  0.75 mcg Oral Q M,W,F-HD  . Chlorhexidine Gluconate Cloth  6 each Topical Q0600  . colchicine  0.3 mg Oral Q M,W,F-HD  . gabapentin  100 mg Oral QHS  . midodrine  10 mg Oral Once in dialysis  . midodrine  10 mg Oral TID WC  . multivitamin  1  tablet Oral QHS  . sucroferric oxyhydroxide  1,000 mg Oral TID WC & HS   Continuous Infusions: . sodium chloride Stopped (08/25/19 1705)     LOS: 5 days        Hosie Poisson, MD [facility] Pager [phone]  If 7PM-7AM, please contact night-coverage [email or web address] Password [facility] 08/31/2019, 9:47 AM

## 2019-08-31 NOTE — Progress Notes (Addendum)
[facility] Progress Note   Subjective:   Seen in room. Had rough night, didn't sleep then hypotensive event when standing up to go to bathroom. Got fluid bolus. HR 80s on monitor this am.  Tolerated full HD yesterday with 2.5L UF.   Objective Vitals:   08/31/19 0509 08/31/19 0516 08/31/19 0528 08/31/19 0541  BP: (!) 103/58 (!) 79/50 (!) 80/40 (!) 86/54  Pulse:  (!) 42    Resp:      Temp:      TempSrc:      SpO2:  93%    Weight:      Height:       Physical Exam General: Well developed, well nourished female, fatigued.  Heart: RRR, no murmurs, rubs or gallops Lungs: CTA bilaterally without wheezing, rhonchi or rales Abdomen: Soft, non-tender, non-distended. +BS Extremities: No peripheral edema Dialysis Access:  L forearm AVF with two aneurysms+Bruit  Additional Objective Labs: Basic Metabolic Panel: Recent Labs  Lab 08/26/19 0348 08/27/19 0830 08/30/19 0700  NA 135 136 138  K 4.6 4.5 5.2*  CL 96* 96* 97*  CO2 25 28 26   GLUCOSE 72 74 175*  BUN 40* 66* 73*  CREATININE 8.74* 11.84* 14.27*  CALCIUM 7.3* 7.9* 8.7*  PHOS  --  3.3 2.7   Liver Function Tests: Recent Labs  Lab 08/27/19 0830 08/30/19 0700 08/30/19 1250  AST  --   --  34  ALT  --   --  43  ALKPHOS  --   --  133*  BILITOT  --   --  0.4  PROT  --   --  7.1  ALBUMIN 2.9* 2.6* 3.1*   CBC: Recent Labs  Lab 08/24/19 1747  08/26/19 0348 08/26/19 0859 08/27/19 0831 08/28/19 0256 08/30/19 0641  WBC 8.9   < > 9.4 9.1 8.2 8.0 9.5  NEUTROABS 6.5  --   --   --  5.4  --   --   HGB 13.1   < > 8.8* 12.3 11.0* 10.5* 10.3*  HCT 42.3   < > 28.3* 37.2 34.1* 32.1* 31.9*  MCV 103.4*   < > 103.3* 99.5 100.0 98.2 100.6*  PLT 147*   < > 152 154 202 200 254   < > = values in this interval not displayed.   Blood Culture    Component Value Date/Time   SDES URINE, RANDOM 11/05/2012 2355   SPECREQUEST NONE 11/05/2012 2355   CULT NO GROWTH 11/05/2012 2355   REPTSTATUS 11/07/2012 FINAL 11/05/2012  2355    CBG: Recent Labs  Lab 08/30/19 1206 08/30/19 1521 08/30/19 2110 08/31/19 0437 08/31/19 0743  GLUCAP 95 155* 207* 124* 102*   Medications: . sodium chloride Stopped (08/25/19 1705)   . amiodarone  400 mg Oral BID  . apixaban  5 mg Oral BID  . atorvastatin  40 mg Oral q1800  . calcitRIOL  0.75 mcg Oral Q M,W,F-HD  . Chlorhexidine Gluconate Cloth  6 each Topical Q0600  . colchicine  0.3 mg Oral Q M,W,F-HD  . gabapentin  100 mg Oral QHS  . midodrine  10 mg Oral Once in dialysis  . midodrine  10 mg Oral TID WC  . multivitamin  1 tablet Oral QHS  . sucroferric oxyhydroxide  1,000 mg Oral TID WC & HS    Dialysis Orders: [facility] MWF 4H 200Re BFR 450 EDW 99kg 2K/2Ca Heparin 10000 Access AVF Parsabiv 15 Calcitriol 0.75 Recent OP Labs: Hgb 12.2 K 4.7 Ca 8.3 Phos  3.9 PTH 1960  Assessment/Plan: 1. Atypical chest pain:Concern for pericarditis.On colchicine.Hs-troponin peaked at 12. EKG with diffuse ST elevation. Echo showed LVEF of 60-65%, normal RV function.CP significantly improved. Cardiology following 2. PAfib:On Eliquis. . Intermittent tachycardia since 9/24 (PAF vs SVT vs atrial tachycardia). Converted to NSR on IV amio this weekend >>PO amio per cardiology.  3. ESRD:HD MWF. Next HD 9/30 . Aneurysms evaluated by Dr. Donnetta Hutching, reports it is safe to use access and to avoid areas of superficial blistering. Appreciate his assistance.  4. Hypertension/volume:BP soft. On midodrine for BP support, increased to TID. Near EDW now.  5. Anemia:Hgb 10.3. Not on ESA. Follow.  6. Metabolic bone disease: Continue bindersand calcitriol. On Parasbivfor 2HPTH- not availablein hospital. Resume at discharge.   Lynnda Child PA-C [facility] Pager [phone] 08/31/2019,9:11 AM  Pt seen, examined and agree w A/P as above.  Kelly Splinter  MD 08/31/2019, 1:05 PM

## 2019-08-31 NOTE — Brief Op Note (Addendum)
08/25/2019 - 08/31/2019  2:52 PM  PATIENT:  Brenda Arnold  63 y.o. female  PRE-OPERATIVE DIAGNOSIS:  Pericardial effusion with tamponade  POST-OPERATIVE DIAGNOSIS:  Pericardial effusion with tamponade  PROCEDURE:  RIGHT VIDEO ASSISTED THORACOSCOPY, PERICARDIAL WINDOW, ABORTED PERICARDIOCENTESIS   SURGEON:  Surgeon(s) and Role:    Lightfoot, Lucile Crater, MD - Primary  PHYSICIAN ASSISTANT: Lars Pinks PA-C   ANESTHESIA:   general  EBL:  400 cc hemorrhagic fluid, fibrinous exudate seen around heart  BLOOD ADMINISTERED:none  DRAINS: Blake drain placed in the pericardial space   SPECIMEN:  Source of Specimen:  Pericardial biopsy and pericardial fluid  DISPOSITION OF SPECIMEN:  Pathology, culture, gram stain  COUNTS CORRECT:  YES  DICTATION: .Dragon Dictation  PLAN OF CARE: Admit to inpatient   PATIENT DISPOSITION:  ICU - extubated and stable.   Delay start of Pharmacological VTE agent (>24hrs) due to surgical blood loss or risk of bleeding: yes Per Dr. Kipp Brood, recommend restarting Apixaban in 2-3 days

## 2019-08-31 NOTE — Progress Notes (Signed)
Pt's incisional site bleeding moderately. Dr. Kipp Brood made aware and at bedside to assess pt. Pt's dressing completely saturated with blood as well as moderate amount of blood to bed pad. Dr. Kipp Brood aware and ordered dressing to be reinforced and monitored. Pt's dressing reinforced. Will continue to monitor.

## 2019-08-31 NOTE — Progress Notes (Signed)
OT Cancellation Note  Patient Details Name: ANNALIAH PRENATT MRN: [id number] DOB: September 05, 1956   Cancelled Treatment:    Reason Eval/Treat Not Completed: Medical issues which prohibited therapy RN requested to hold therapy at this time secondary to pt's hypotensive event early this AM. OT will continue to follow and evaluate pt as appropriate and time allows.   Dorinda Hill OTR/L Acute Rehabilitation Services Office: [place] 08/31/2019, 9:53 AM

## 2019-08-31 NOTE — Progress Notes (Signed)
  Echocardiogram 2D Echocardiogram limited has been performed.  Brenda Arnold M 08/31/2019, 8:34 AM

## 2019-08-31 NOTE — Progress Notes (Addendum)
Progress Note  Patient Name: Brenda Arnold Date of Encounter: 08/31/2019  Primary Cardiologist: Fransico Him, MD   Subjective   Patient had significant hypotension overnight. Pressures improved with IVF bolus. Stat echo this AM to check for tamponade. Patient has pain with a deep breath.   Inpatient Medications    Scheduled Meds: . amiodarone  400 mg Oral BID  . apixaban  5 mg Oral BID  . atorvastatin  40 mg Oral q1800  . calcitRIOL  0.75 mcg Oral Q M,W,F-HD  . Chlorhexidine Gluconate Cloth  6 each Topical Q0600  . colchicine  0.3 mg Oral Q M,W,F-HD  . gabapentin  100 mg Oral QHS  . midodrine  10 mg Oral Once in dialysis  . midodrine  10 mg Oral TID WC  . multivitamin  1 tablet Oral QHS  . sucroferric oxyhydroxide  1,000 mg Oral TID WC & HS   Continuous Infusions: . sodium chloride Stopped (08/25/19 1705)   PRN Meds: acetaminophen **OR** acetaminophen, HYDROmorphone (DILAUDID) injection, menthol-cetylpyridinium, morphine injection, ondansetron (ZOFRAN) IV, senna-docusate   Vital Signs    Vitals:   08/31/19 0509 08/31/19 0516 08/31/19 0528 08/31/19 0541  BP: (!) 103/58 (!) 79/50 (!) 80/40 (!) 86/54  Pulse:  (!) 42    Resp:      Temp:      TempSrc:      SpO2:  93%    Weight:      Height:        Intake/Output Summary (Last 24 hours) at 08/31/2019 0736 Last data filed at 08/31/2019 0500 Gross per 24 hour  Intake 1220 ml  Output 2500 ml  Net -1280 ml   Last 3 Weights 08/30/2019 08/30/2019 08/30/2019  Weight (lbs) 222 lb 0.1 oz 229 lb 4.5 oz 226 lb 8 oz  Weight (kg) 100.7 kg 104 kg 102.74 kg      Telemetry    NSR, HR 70-80s, 5 beat run of Vtach, 145 bpm - Personally Reviewed  ECG     No new- Personally Reviewed  Physical Exam   GEN: No acute distress.   Neck: + JVD Cardiac: RRR, no murmurs, rubs, or gallops.  Respiratory: crackles bilaterally. GI: Soft, nontender, non-distended  MS: No edema; No deformity. Neuro:  Nonfocal  Psych: Normal affect    Labs    High Sensitivity Troponin:   Recent Labs  Lab 08/24/19 1747 08/24/19 2022 08/25/19 0931 08/25/19 1124  TROPONINIHS '7 8 12 10      ' Chemistry Recent Labs  Lab 08/26/19 0348 08/27/19 0830 08/30/19 0700 08/30/19 1250  NA 135 136 138  --   K 4.6 4.5 5.2*  --   CL 96* 96* 97*  --   CO2 '25 28 26  ' --   GLUCOSE 72 74 175*  --   BUN 40* 66* 73*  --   CREATININE 8.74* 11.84* 14.27*  --   CALCIUM 7.3* 7.9* 8.7*  --   PROT  --   --   --  7.1  ALBUMIN  --  2.9* 2.6* 3.1*  AST  --   --   --  34  ALT  --   --   --  43  ALKPHOS  --   --   --  133*  BILITOT  --   --   --  0.4  GFRNONAA 4* 3* 2*  --   GFRAA 5* 4* 3*  --   ANIONGAP '14 12 15  ' --  Hematology Recent Labs  Lab 08/27/19 0831 08/28/19 0256 08/30/19 0641  WBC 8.2 8.0 9.5  RBC 3.41* 3.27* 3.17*  HGB 11.0* 10.5* 10.3*  HCT 34.1* 32.1* 31.9*  MCV 100.0 98.2 100.6*  MCH 32.3 32.1 32.5  MCHC 32.3 32.7 32.3  RDW 15.6* 15.6* 15.7*  PLT 202 200 254    BNPNo results for input(s): BNP, PROBNP in the last 168 hours.   DDimer No results for input(s): DDIMER in the last 168 hours.   Radiology    No results found.  Cardiac Studies   Limited Echo 08/28/19 1. Left ventricular ejection fraction, by visual estimation, is 60 to 65%. The left ventricle has normal function. Normal left ventricular size. There is no left ventricular hypertrophy. 2. Global right ventricle has normal systolic function.The right ventricular size is normal. No increase in right ventricular wall thickness. 3. Left atrial size was normal. 4. Right atrial size was normal. 5. Moderate pericardial effusion. 6. The pericardial effusion is circumferential. 7. The mitral valve is normal in structure. No evidence of mitral valve regurgitation. No evidence of mitral stenosis. 8. The tricuspid valve is normal in structure. Tricuspid valve regurgitation is trivial. 9. The aortic valve is normal in structure. Aortic valve regurgitation  was not visualized by color flow Doppler. Structurally normal aortic valve, with no evidence of sclerosis or stenosis. 10. The pulmonic valve was normal in structure. Pulmonic valve regurgitation is not visualized by color flow Doppler. 11. Normal pulmonary artery systolic pressure. 12. The inferior vena cava is normal in size with greater than 50% respiratory variability, suggesting right atrial pressure of 3 mmHg. 13. Compared to 08/26/2019, the pericardial effusion appears larger, but there is no evidence of tamponade.  Echo 08/26/19 1. Left ventricular ejection fraction, by visual estimation, is 60 to 65%. The left ventricle has normal function. Normal left ventricular size. There is mildly increased left ventricular hypertrophy. 2. Left ventricular diastolic Doppler parameters are consistent with impaired relaxation pattern of LV diastolic filling. 3. The aortic valve is tricuspid Aortic valve regurgitation was not visualized by color flow Doppler. Mild aortic valve sclerosis without stenosis. 4. Left atrial size was normal. 5. Right atrial size was normal. 6. Global right ventricle has normal systolic function.The right ventricular size is normal. No increase in right ventricular wall thickness. 7. Trivial pericardial effusion is present. 8. The tricuspid valve is normal in structure. Tricuspid valve regurgitation was not visualized by color flow Doppler. 9. The mitral valve is normal in structure. No evidence of mitral valve regurgitation. No evidence of mitral stenosis. 10. TR signal is inadequate for assessing pulmonary artery systolic pressure. 11. The inferior vena cava is normal in size with greater than 50% respiratory variability, suggesting right atrial pressure of 3 mmHg.  Patient Profile     63 y.o. female with a hx of DM2, HTN, ESRD on HD, paroxysmal Afib, OSA, obesity, chest pain s/p MV 2011, myoview 04/2017, w/o ischemia who is being seen for chest pain and abnormal  EKG.   Assessment & Plan    Chest pain/Pericarditis/Pericardial effusion - Patient was admitted last week for chest pain, Hs trop negative - EKG showed diffuse ST elevation >> suspected pericarditis.  ESR 65, CRP 123 - Patient was started on ibuprofen per pharmacy but later switched to renally dosed colchicine for HD days. - Repeat echo for tachycardia (afib RVR) and hypotension (likely due to post HD). Echo showed EF 60-65%, G1DD, and moderate pericardial effusion with no signs of tamponade - Patient  was hypotensive yesterday, systolic in the 18E. STAT limited echo was ordered to evaluate for tamponade. Patient had Eliquis last night, now on hold. - Patient is still hypotensive, rates in the 80s.  Hypotension - Patient had an event yesterday after HD of low pressures requiring IVF bolus.  - At baseline she takes midodrine 10 mg TID  - Hypotension is common on HD days; not on antihypertensives at baseline - Pressures still low this AM, 86/54 - Will check a limited echo for signs of tamponade  Paroxysmal afib - Eliquis for chronic A/C>> held for possible procedure - Continue amiodarone for rhythm control.Amio 400 mg x 1 week and then 200 mg for 2 weeks - Maintaining NSR - TSH 1.026 - LFTs- alkaline phosphatase 133, other values wnl  ESRD on HD - h/o of hypotension following HD sometimes requiring midodrine - Not on antihypertensives at baseline  Hyperlipidemia - Patient does not want to take statins and prefers diet changes - LDL 115  DM2 - A1C 4.9 - diet controlled   For questions or updates, please contact [facility] Please consult [email or web address] for contact info under      Signed, Cadence Ninfa Meeker, PA-C  08/31/2019, 7:36 AM    Patient seen and examined.  Agree with above documentation.  Patient became hypotensive overnight, improved with IV fluids.  Given her known moderate pericardial effusion.  A stat echo was ordered this morning.  Echo personally reviewed.   Pericardial effusion was large, IVC is fixed and dilated, there was mitral inflow respiratory variation, no clear RV diastolic collapse.  She remained hypotensive with systolic blood pressures in the 70s.  Overall findings concerning for tamponade.  Transferred to CCU this morning, PCCM consulted, and discussed case with interventional cardiology and cardiothoracic surgery.  Given that effusion was likely hemorrhagic and she had received Eliquis last night, and would have an ongoing need for Eliquis with her A. fib, best treatment option was felt to be a pericardial window.  She went to the OR with Dr. Kipp Brood, and underwent a successful pericardial window with significant improvement in her blood pressure.  CRITICAL CARE TIME: I have spent a total of 40 minutes with patient reviewing hospital notes, telemetry, EKGs, labs and examining the patient as well as establishing an assessment and plan that was discussed with the patient.  > 50% of time was spent in direct patient care. The patient is critically ill with multi-organ system failure and requires high complexity decision making for assessment and support, frequent evaluation and titration of therapies, application of advanced monitoring technologies and extensive interpretation of multiple databases.

## 2019-08-31 NOTE — Consult Note (Addendum)
NAME:  Brenda Arnold, MRN:  [id number], DOB:  [birth year], LOS: 5 ADMISSION DATE:  08/25/2019, CONSULTATION DATE:  08/27/19 REFERRING MD:  Joseph Berkshire, CHIEF COMPLAINT:  Hypotension   Brief History   Brenda Arnold is a 63 yo F w/ PMH of ESRD on HD, PAF presented w/ chest pain.  PCCM previously consulted for hypotension after dialysis (9/23)  9/29 PCCM reconsulted for worsening pericardial effusion, now with worsening hypotension.   History of present illness   Brenda Arnold is a 63 yo F w/ PMH of ESRD on HD, OSA, DM2, HTN, PAF who presented w/ A.fib w/ RVR and atypical chest pain. She states she was in her usual state of health until 9/22 when she presented to her PCP's office with chest pain. She was told by PCP to go to [facility] due to A.fib w/ RVR. She states that she takes midodrine at home for hypotension and takes it 3 times a day and her blood pressure has been stable at home, with intermittent hypotension on dialysis days, although she mentions not checking her bp regularly.  During this admission, she received amiodarone for her A.fib and despite her rate improving, she has been getting intermittent chest pain. This morning she was found to be tachycardic w/ chest pain requiring early cessation of her dialysis session. At this time she received dilaudid in addition to morphine given earlier this morning. Shortly after she had worsening hypotension with bp down to 0000000 systolic. She received small boluses of fluid with mild improvement in her bp. PCCM was consulted for hypotension.  Past Medical History  ESRD OSA DM2 HTN PAF Obesity  Significant Hospital Events   9/25 HD session cut short due to hypotension  Consults:  Cardiology Nephrology  Procedures:  N/A  Significant Diagnostic Tests:  9/22 CTA chest/abd/pelvis: no acute findings 9/24 ECHO> trivial pericardial effusion. LVEF 60-65% 9/26 ECHO> moderate pericardial effusion LVEF 60-65% 9/29 ECHO> large pericardial effusion.  LVEF 55-60%  Micro Data:  9/22 COVID neg  Antimicrobials:  none  Interim history/subjective:   Patient with worsening BP s/p 500 ml  Bolus and 1L bolus.  Patient is drowsy but is not in any acute distress.  No respiratory complaints Endorses mild chest pain.  Objective   Blood pressure (!) 86/54, pulse (!) 42, temperature 99.1 F (37.3 C), temperature source Oral, resp. rate 18, height 5\' 6"  (1.676 m), weight 100.7 kg, SpO2 93 %.        Intake/Output Summary (Last 24 hours) at 08/31/2019 1006 Last data filed at 08/31/2019 0500 Gross per 24 hour  Intake 1220 ml  Output 2500 ml  Net -1280 ml   Filed Weights   08/30/19 0630 08/30/19 0730 08/30/19 1120  Weight: 102.7 kg 104 kg 100.7 kg   Examination: Gen: chronically ill appearing adult F, reclined in bed NAD  HEENT: NCAT. Pink, tacky mm. Trache midline. Neck is supple. No JVD CV: RRR. Muffled heart sounds. Cap refill < 3 seconds  Pulm: CTA bilaterally. Symmetrical chest expansion. No accessory muscl use Abd: soft, round, ndnt. + bowel sounds  Extm: LUE AV fistula.  Symmetrical bulk and tone. No cyanosis or clubbing Skin: Clean, dry, warm. BLE with dry patches.  Neuro: AAOx3. Following commands.  Psych: appropriate for age and situation.  Resolved Hospital Problem list   N/A  Assessment & Plan:   Large Pericardial Effusion with Hypotension -concerning for tamponade -interval increase in size from ECHO 9/24 to 9/29 -Presented with atypical chest pain, started on colchicine for acute  pericarditis  P -Holding eliquis -Continue colchicine -Cardiology to perform pericardiocentesis -Cardiology is also consulting CT surgery for evaluation.  -Consider reversing eliquis, may expedite pericardiocentesis timing in setting of worsening hypotension -Anticipate hypotension with improve following pericardiocentesis   Atrial fibrillation with RVR P -Cardiology following and managing -Continue amiodarone -Will check BMP, mag,  phos.   Hx DVT -on home eliquis  P -Hold eliquis in setting of pericardial effusion, anticipating pericardiocentesis.   ESRD -MWF iHD P -iHD per nephrology -Continue midodrine  Anemia of chronic disease -likely in setting of ESRD P -Monitor CBC. H/H stable appearing.  -Holding eliquis   DMII  -SSI  Best practice:  Diet: NPO at present. Following procedure, renal/carb modified Pain/Anxiety/Delirium protocol (if indicated): dilaudid VAP protocol (if indicated): N/A DVT prophylaxis: SCD GI prophylaxis: N/A Glucose control: SSI Mobility: Up w/ assistance Code Status: Full Family Communication: N/A Disposition: ICU  Labs   CBC: Recent Labs  Lab 08/24/19 1747  08/26/19 0348 08/26/19 0859 08/27/19 0831 08/28/19 0256 08/30/19 0641  WBC 8.9   < > 9.4 9.1 8.2 8.0 9.5  NEUTROABS 6.5  --   --   --  5.4  --   --   HGB 13.1   < > 8.8* 12.3 11.0* 10.5* 10.3*  HCT 42.3   < > 28.3* 37.2 34.1* 32.1* 31.9*  MCV 103.4*   < > 103.3* 99.5 100.0 98.2 100.6*  PLT 147*   < > 152 154 202 200 254   < > = values in this interval not displayed.    Basic Metabolic Panel: Recent Labs  Lab 08/24/19 1747 08/24/19 1802 08/25/19 0931 08/26/19 0348 08/27/19 0830 08/30/19 0700  NA 137 137 135 135 136 138  K 4.6 4.4 4.6 4.6 4.5 5.2*  CL 93* 95* 91* 96* 96* 97*  CO2 29  --  27 25 28 26   GLUCOSE 93 86 80 72 74 175*  BUN 40* 42* 24* 40* 66* 73*  CREATININE 9.73* 9.90* 6.58* 8.74* 11.84* 14.27*  CALCIUM 8.0*  --  8.1* 7.3* 7.9* 8.7*  PHOS  --   --   --   --  3.3 2.7   GFR: Estimated Creatinine Clearance: 4.9 mL/min (A) (by C-G formula based on SCr of 14.27 mg/dL (H)). Recent Labs  Lab 08/25/19 1012  08/26/19 0859 08/27/19 0831 08/28/19 0256 08/30/19 0641  WBC  --    < > 9.1 8.2 8.0 9.5  LATICACIDVEN 1.6  --   --   --   --   --    < > = values in this interval not displayed.    Liver Function Tests: Recent Labs  Lab 08/27/19 0830 08/30/19 0700 08/30/19 1250  AST  --    --  34  ALT  --   --  43  ALKPHOS  --   --  133*  BILITOT  --   --  0.4  PROT  --   --  7.1  ALBUMIN 2.9* 2.6* 3.1*   No results for input(s): LIPASE, AMYLASE in the last 168 hours. No results for input(s): AMMONIA in the last 168 hours.  ABG    Component Value Date/Time   TCO2 37 (H) 08/24/2019 1802     Coagulation Profile: No results for input(s): INR, PROTIME in the last 168 hours.  Cardiac Enzymes: No results for input(s): CKTOTAL, CKMB, CKMBINDEX, TROPONINI in the last 168 hours.  HbA1C: Hgb A1c MFr Bld  Date/Time Value Ref Range Status  07/21/2018 04:35  PM 4.9 <5.7 % of total Hgb Final    Comment:    For the purpose of screening for the presence of diabetes: . <5.7%       Consistent with the absence of diabetes 5.7-6.4%    Consistent with increased risk for diabetes             (prediabetes) > or =6.5%  Consistent with diabetes . This assay result is consistent with a decreased risk of diabetes. . Currently, no consensus exists regarding use of hemoglobin A1c for diagnosis of diabetes in children. . According to American Diabetes Association (ADA) guidelines, hemoglobin A1c <7.0% represents optimal control in non-pregnant diabetic patients. Different metrics may apply to specific patient populations.  Standards of Medical Care in Diabetes(ADA). Marland Kitchen   04/14/2018 02:54 PM 4.7 <5.7 % of total Hgb Final    Comment:    For the purpose of screening for the presence of diabetes: . <5.7%       Consistent with the absence of diabetes 5.7-6.4%    Consistent with increased risk for diabetes             (prediabetes) > or =6.5%  Consistent with diabetes . This assay result is consistent with a decreased risk of diabetes. . Currently, no consensus exists regarding use of hemoglobin A1c for diagnosis of diabetes in children. . According to American Diabetes Association (ADA) guidelines, hemoglobin A1c <7.0% represents optimal control in non-pregnant diabetic  patients. Different metrics may apply to specific patient populations.  Standards of Medical Care in Diabetes(ADA). .     CBG: Recent Labs  Lab 08/30/19 1206 08/30/19 1521 08/30/19 2110 08/31/19 0437 08/31/19 0743  GLUCAP 95 155* 207* 124* 102*     CRITICAL CARE Performed by: Cristal Generous   Total critical care time: 35 minutes  Critical care time was exclusive of separately billable procedures and treating other patients. Critical care was necessary to treat or prevent imminent or life-threatening deterioration.  Critical care was time spent personally by me on the following activities: development of treatment plan with patient and/or surrogate as well as nursing, discussions with consultants, evaluation of patient's response to treatment, examination of patient, obtaining history from patient or surrogate, ordering and performing treatments and interventions, ordering and review of laboratory studies, ordering and review of radiographic studies, pulse oximetry and re-evaluation of patient's condition.  Eliseo Gum MSN, AGACNP-BC [place] [id number] If no answer, [id number] 08/31/2019, 10:06 AM   PCCM:  63 yo FM, consulted for hypotension, ESRD, on h/o VTE and Afib on eliquis. ECHO with tamponade.  I spoke with [facility] Dr. Karleen Hampshire. Agree with ICU transfer. Coordinated care with Dr. Gardiner Rhyme from Cardiology and Dr. Kipp Brood from [place]. We appreciate the care.   PCCM is available for post-op ICU care. Unless TCTS would like to remain primary.   Blood pressure (!) 104/27, pulse 85, temperature 99.1 F (37.3 C), temperature source Oral, resp. rate (!) 24, height 5\' 6"  (1.676 m), weight 100.7 kg, SpO2 96 %. Gen: elderly fm, resting in bed HENT: JVD, mild Heart: RRR, s1 s2, muffled heart tones Lungs: CTAB  Labs reviewed Bedside US: subcostal view with large pericardial effusion   A: Pericardial effusion  Cardiac Tamponade Cardiogenic  shock, obstructive  ESRD   P: ASAP to OR per TCTS for drainage  Post-op to 2H  Agree with fluid bolus prior to OR  Coordination of care with Cardiology and Surgery   This patient is critically ill with multiple  organ system failure; which, requires frequent high complexity decision making, assessment, support, evaluation, and titration of therapies. This was completed through the application of advanced monitoring technologies and extensive interpretation of multiple databases. During this encounter critical care time was devoted to patient care services described in this note for 34 minutes.  Garner Nash, DO [facility] 08/31/2019 3:25 PM  Personal pager: [phone] If unanswered, please page CCM On-call: [phone]

## 2019-08-31 NOTE — Progress Notes (Signed)
PT Cancellation Note  Patient Details Name: Brenda Arnold MRN: [id number] DOB: Nov 13, 1956   Cancelled Treatment:    Reason Eval/Treat Not Completed: Medical issues which prohibited therapy   RN requested to hold therapy at this time secondary to pt's hypotensive event early this AM;   Roney Marion, [place] Pager [phone] Office [place] 08/31/2019, 10:38 AM

## 2019-08-31 NOTE — Significant Event (Signed)
Rapid Response Event Note  Overview: Hypotension in the setting of probable pericarditis  Initial Focused Assessment: I was notified by the nursing staff of hypotension (SBP 50s) after patient had been up to the [facility] trying to have a BM. Upon arrival, Brenda Arnold was awake, complaining of multiple things, oriented x4.  Palpable bruit and audible thrill in L wrist graft. Difficult to obtain accurate manual or NIBP. Checked pressures in RLE and Right wrist and upper arm. HR 76-82 SR, BP 75/39 in RLE with bolus infusing, RR 18.  No distress, SOB or CP. Skin is warm, pink and dry. Oral mucosa pink. Telemetry reviewed and no bradycardia or ectopic events. Brenda Boll NP notified by Humberto Seals and orders received for NS bolus and recheck BP following Bolus. CBG 124. BP 103/58 (71) while bolus continuing.   Interventions: -NS Bolus 500cc  Plan of Care (if not transferred): -Recheck full set of VS with manual BP after bolus -Notify primary service and/or RRRN if further assistance is needed.  Event Summary: Call received: 0436 Arrived at call: 0440 Call ended: 0530  Madelynn Done

## 2019-08-31 NOTE — Transfer of Care (Signed)
Immediate Anesthesia Transfer of Care Note  Patient: Brenda Arnold  Procedure(s) Performed: VIDEO ASSISTED THORACOSCOPY/ DRAINAGE OF PERICARDIAL EFFUSION/ PERICARDIAL WINDOW/ ABORTED PERICARDIOCENTESIS  (Right Chest)  Patient Location: PACU  Anesthesia Type:General  Level of Consciousness: awake, alert  and oriented  Airway & Oxygen Therapy: Patient Spontanous Breathing  Post-op Assessment: Report given to RN and Post -op Vital signs reviewed and stable  Post vital signs: Reviewed and stable  Last Vitals:  Vitals Value Taken Time  BP 104/27 08/31/19 1513  Temp    Pulse    Resp 21 08/31/19 1514  SpO2    Vitals shown include unvalidated device data.  Last Pain:  Vitals:   08/31/19 1100  TempSrc:   PainSc: 0-No pain      Patients Stated Pain Goal: 0 ([phone] [id number])  Complications: No apparent anesthesia complications

## 2019-08-31 NOTE — Progress Notes (Signed)
Patient's BP 70/55 at 0734.  Am dose of midodrine given.  Dr Karleen Hampshire notified and aware. Follow BP at 0816 88/50.

## 2019-08-31 NOTE — Progress Notes (Signed)
Gerald Stabs, CRNA at [employer], CRNA at bedside to assume patient care.

## 2019-09-01 ENCOUNTER — Encounter (HOSPITAL_COMMUNITY): Payer: Self-pay | Admitting: Thoracic Surgery (Cardiothoracic Vascular Surgery)

## 2019-09-01 ENCOUNTER — Inpatient Hospital Stay (HOSPITAL_COMMUNITY): Payer: BC Managed Care – PPO

## 2019-09-01 DIAGNOSIS — R570 Cardiogenic shock: Secondary | ICD-10-CM

## 2019-09-01 DIAGNOSIS — Z992 Dependence on renal dialysis: Secondary | ICD-10-CM | POA: Diagnosis not present

## 2019-09-01 DIAGNOSIS — N186 End stage renal disease: Secondary | ICD-10-CM | POA: Diagnosis not present

## 2019-09-01 DIAGNOSIS — E1122 Type 2 diabetes mellitus with diabetic chronic kidney disease: Secondary | ICD-10-CM | POA: Diagnosis not present

## 2019-09-01 LAB — CBC
HCT: 30.2 % — ABNORMAL LOW (ref 36.0–46.0)
Hemoglobin: 9.7 g/dL — ABNORMAL LOW (ref 12.0–15.0)
MCH: 32.8 pg (ref 26.0–34.0)
MCHC: 32.1 g/dL (ref 30.0–36.0)
MCV: 102 fL — ABNORMAL HIGH (ref 80.0–100.0)
Platelets: 290 10*3/uL (ref 150–400)
RBC: 2.96 MIL/uL — ABNORMAL LOW (ref 3.87–5.11)
RDW: 15.5 % (ref 11.5–15.5)
WBC: 10.4 10*3/uL (ref 4.0–10.5)
nRBC: 0 % (ref 0.0–0.2)

## 2019-09-01 LAB — BASIC METABOLIC PANEL
Anion gap: 13 (ref 5–15)
BUN: 44 mg/dL — ABNORMAL HIGH (ref 8–23)
CO2: 24 mmol/L (ref 22–32)
Calcium: 8.5 mg/dL — ABNORMAL LOW (ref 8.9–10.3)
Chloride: 100 mmol/L (ref 98–111)
Creatinine, Ser: 10.38 mg/dL — ABNORMAL HIGH (ref 0.44–1.00)
GFR calc Af Amer: 4 mL/min — ABNORMAL LOW (ref >60)
GFR calc non Af Amer: 4 mL/min — ABNORMAL LOW (ref >60)
Glucose, Bld: 100 mg/dL — ABNORMAL HIGH (ref 70–99)
Potassium: 5.1 mmol/L (ref 3.5–5.1)
Sodium: 137 mmol/L (ref 135–145)

## 2019-09-01 LAB — HEMOGLOBIN A1C
Hgb A1c MFr Bld: 4.9 % (ref 4.8–5.6)
Mean Plasma Glucose: 93.93 mg/dL

## 2019-09-01 LAB — GLUCOSE, CAPILLARY
Glucose-Capillary: 103 mg/dL — ABNORMAL HIGH (ref 70–99)
Glucose-Capillary: 100 mg/dL — ABNORMAL HIGH (ref 70–99)
Glucose-Capillary: 91 mg/dL (ref 70–99)
Glucose-Capillary: 92 mg/dL (ref 70–99)

## 2019-09-01 LAB — SURGICAL PATHOLOGY

## 2019-09-01 LAB — CYTOLOGY - NON PAP

## 2019-09-01 MED ORDER — PENTAFLUOROPROP-TETRAFLUOROETH EX AERO: 1.0000 "application " | INHALATION_SPRAY | CUTANEOUS | Status: DC | PRN

## 2019-09-01 MED ORDER — DIPHENHYDRAMINE HCL 50 MG/ML IJ SOLN
25.0000 mg | Freq: Once | INTRAMUSCULAR | Status: AC
  Administered 2019-09-01: 25 mg via INTRAVENOUS
  Filled 2019-09-01: qty 1

## 2019-09-01 MED ORDER — LIDOCAINE HCL (PF) 1 % IJ SOLN: 5.0000 mL | INTRAMUSCULAR | Status: DC | PRN

## 2019-09-01 MED ORDER — ALBUMIN HUMAN 5 % IV SOLN
12.5000 g | Freq: Once | INTRAVENOUS | Status: AC
  Administered 2019-09-01: 12.5 g via INTRAVENOUS
  Filled 2019-09-01: qty 500

## 2019-09-01 MED ORDER — PHENYLEPHRINE HCL-NACL 10-0.9 MG/250ML-% IV SOLN
INTRAVENOUS | Status: AC
  Administered 2019-09-01: 20 ug/min via INTRAVENOUS
  Filled 2019-09-01: qty 250

## 2019-09-01 MED ORDER — HEPARIN SODIUM (PORCINE) 1000 UNIT/ML DIALYSIS: 1000.0000 [IU] | INTRAMUSCULAR | Status: DC | PRN

## 2019-09-01 MED ORDER — ALTEPLASE 2 MG IJ SOLR: 2.0000 mg | Freq: Once | INTRAMUSCULAR | Status: DC | PRN

## 2019-09-01 MED ORDER — AMIODARONE IV BOLUS ONLY 150 MG/100ML: 150.0000 mg | Freq: Once | INTRAVENOUS | Status: DC

## 2019-09-01 MED ORDER — HEPARIN SODIUM (PORCINE) 1000 UNIT/ML DIALYSIS
5000.0000 [IU] | Freq: Once | INTRAMUSCULAR | Status: AC
  Administered 2019-09-01: 5000 [IU] via INTRAVENOUS_CENTRAL

## 2019-09-01 MED ORDER — HEPARIN SODIUM (PORCINE) 1000 UNIT/ML IJ SOLN
INTRAMUSCULAR | Status: AC
  Filled 2019-09-01: qty 5

## 2019-09-01 MED ORDER — LIDOCAINE-PRILOCAINE 2.5-2.5 % EX CREA
1.0000 "application " | TOPICAL_CREAM | CUTANEOUS | Status: DC | PRN
  Filled 2019-09-01: qty 5

## 2019-09-01 MED ORDER — SODIUM CHLORIDE 0.9 % IV SOLN: 100.0000 mL | INTRAVENOUS | Status: DC | PRN

## 2019-09-01 MED ORDER — ALBUMIN HUMAN 5 % IV SOLN
12.5000 g | Freq: Once | INTRAVENOUS | Status: AC
  Administered 2019-09-01: 12.5 g via INTRAVENOUS

## 2019-09-01 MED ORDER — INSULIN ASPART 100 UNIT/ML ~~LOC~~ SOLN
0.0000 [IU] | Freq: Three times a day (TID) | SUBCUTANEOUS | Status: DC
  Administered 2019-09-03: 1 [IU] via SUBCUTANEOUS
  Administered 2019-09-04: 2 [IU] via SUBCUTANEOUS
  Administered 2019-09-04 – 2019-09-05 (×2): 1 [IU] via SUBCUTANEOUS

## 2019-09-01 MED ORDER — PHENYLEPHRINE HCL-NACL 10-0.9 MG/250ML-% IV SOLN
0.0000 ug/min | INTRAVENOUS | Status: DC
  Administered 2019-09-01: 30 ug/min via INTRAVENOUS
  Administered 2019-09-01: 05:00:00 20 ug/min via INTRAVENOUS
  Administered 2019-09-04: 02:00:00 10 ug/min via INTRAVENOUS
  Administered 2019-09-04: 50 ug/min via INTRAVENOUS
  Administered 2019-09-04: 40 ug/min via INTRAVENOUS
  Administered 2019-09-04: 60 ug/min via INTRAVENOUS
  Filled 2019-09-01 (×7): qty 250

## 2019-09-01 NOTE — Progress Notes (Addendum)
NAME:  Brenda Arnold, MRN:  [id number], DOB:  01-Nov-1956, LOS: 6 ADMISSION DATE:  08/25/2019, CONSULTATION DATE:  08/27/19 REFERRING MD:  Joseph Berkshire, CHIEF COMPLAINT:  Hypotension   Brief History   Brenda Arnold is a 63 yo F w/ PMH of ESRD on HD, PAF presented w/ chest pain.  PCCM previously consulted for hypotension after dialysis (9/23)  9/29 PCCM reconsulted for worsening pericardial effusion, now with worsening hypotension.   Past Medical History  ESRD,OSA,DM2,HTN,PAF,Obesity  Significant Hospital Events   9/25 HD session cut short due to hypotension 9/29 to ICU for cardiogenic shock 2/2 pericardial effusion. AC reversed. Went for pericardial window 9/30 still hypotensive. Received fluid bolus X 2 started on Neo, note systolic blood pressure typically in the 90s at baseline  Consults:  Cardiology, Nephrology  Procedures:  9/29: right VAT w/ pericardial window 19 fr chest tube>>>  Significant Diagnostic Tests:  9/22 CTA chest/abd/pelvis: no acute findings 9/24 ECHO> trivial pericardial effusion. LVEF 60-65% 9/26 ECHO> moderate pericardial effusion LVEF 60-65% 9/29 ECHO> large pericardial effusion. LVEF 55-60%  Micro Data:  9/22 COVID neg  Antimicrobials:  none  Interim history/subjective:  She continues to report significant pleuritic type chest pain/stabbing chest pain with deep inspiration otherwise doing well  Objective   Blood pressure 129/66, pulse 82, temperature 98.7 F (37.1 C), temperature source Oral, resp. rate 17, height 5\' 6"  (1.676 m), weight 104 kg, SpO2 97 %.        Intake/Output Summary (Last 24 hours) at 09/01/2019 0826 Last data filed at 09/01/2019 0800 Gross per 24 hour  Intake 3080.7 ml  Output 365 ml  Net 2715.7 ml   Filed Weights   08/30/19 0730 08/30/19 1120 09/01/19 0600  Weight: 104 kg 100.7 kg 104 kg   Examination:  General this is a very pleasant 63 year old black female she is currently resting in bed and in no acute distress on  room air HEENT normocephalic atraumatic no jugular venous distention her mucous membranes are moist Pulmonary clear to auscultation without accessory use.  She is unable to take deep breaths, and breathing is somewhat limited due to inspiratory discomfort from chest tube.  The anterior right chest tube is in satisfactory position with bloody paracardial fluid output Cardiac regular irregular Extremities warm and dry brisk capillary refill she has a left upper extremity AV graft with strong bruit and thrill no edema strong peripheral pulses with brisk capillary refill Abdomen soft not tender no organomegaly positive bowel sounds no nausea Neuro awake oriented no focal or motor deficits  Resolved Hospital Problem list   N/A  Assessment & Plan:   Large Pericardial Effusion in setting of pericarditis with Tamponade and resultant cardiogenic shock (9/29) now s/p pericardial window 9/29  Plan Holding eliquis for 3 days Cont colchicine CT management per CVTS F/u cytology   Acute on chronic hypotension. -Typically maintained on midodrine in the outpatient setting baseline systolic blood pressure in the 90s treated during the evening shift with fluid challenges and started on low-dose Neo-Synephrine Plan Titrate Neo-Synephrine for systolic blood pressure greater than 90 Resume Midodrine   Atrial fibrillation with RVR Plan Cont amio Cards following Cont tele   Pleural effusion (Right > Left) pcxr from 9/29: personally reviewed. CM, right IJ good position. Anterior chest tube looks appropriate. Left effusion not changed. Right effusion/atx increased  Plan Pulse ox Oxygen  Volume removal per renal  Add IS  Hx DVT -on home eliquis  Plan Holding for now. Await CVTS   ESRD -MWF  iHD Plan iHD per neprho  Anemia of chronic disease -likely in setting of ESRD -anemia stable s/p window Plan Trend cbc Holding [facility]   DMII  Plan ssi  Best practice:  Diet: NPO at present.  Following procedure, renal/carb modified Pain/Anxiety/Delirium protocol (if indicated): dilaudid VAP protocol (if indicated): N/A DVT prophylaxis: SCD GI prophylaxis: N/A Glucose control: SSI Mobility: Up w/ assistance Code Status: Full Family Communication: N/A Disposition: ICU; will stay in the intensive care and remains critically ill due to persistent hypotension.  Also risk of bleeding and need for monitoring of pericardial drain.  I suspect her blood pressure is close to baseline and we can be more aggressive about weaning Neo-Synephrine we will change parameters to systolic blood pressure goal of 90.  Additional considerations for today: Today is a scheduled dialysis today, follow-up cytology, continue supportive care  My critical care x32 minutes  Erick Colace ACNP-BC [place] Pager # [phone] OR # [phone] if no answer

## 2019-09-01 NOTE — Plan of Care (Signed)

## 2019-09-01 NOTE — Progress Notes (Signed)
Pt with intermittent HR into 130s and complaints of itching. Dr. Kipp Brood paged. Orders given for benadryl 25mg  IV x1 dose. No new orders given for HR.

## 2019-09-01 NOTE — Progress Notes (Signed)
      [address]       [place] [address]             [phone]      POD # 1 pericardial window Sleeping currently BP (!) 108/53   Pulse 83   Temp 98.5 F (36.9 C) (Oral)   Resp 20   Ht 5\' 6"  (1.676 m)   Wt 104.7 kg   SpO2 92%   BMI 37.26 kg/m   Intake/Output Summary (Last 24 hours) at 09/01/2019 1819 Last data filed at 09/01/2019 1800 Gross per 24 hour  Intake 1752.58 ml  Output 470 ml  Net 1282.58 ml   HR running up into 130s intermittently - will rebolus amiodarone  Remo Lipps C. Roxan Hockey, MD [facility] [phone]

## 2019-09-01 NOTE — Progress Notes (Signed)
[facility] Progress Note Patient Name: ESBEIDY PIGG DOB: [birth year] MRN: [id number]   Date of Service  09/01/2019  HPI/Events of Note  Persistent hypotension despite 250 ml iv bolus of 5 % Albumin. Hgb stable.  eICU Interventions  Albumin 5 5 250 ml iv x 1, Phenylephrine infusion for persistent hypotension after second bolus of Albumin.        Kerry Kass Ogan 09/01/2019, 4:41 AM

## 2019-09-01 NOTE — Anesthesia Postprocedure Evaluation (Signed)
Anesthesia Post Note  Patient: Brenda Arnold  Procedure(s) Performed: VIDEO ASSISTED THORACOSCOPY/ DRAINAGE OF PERICARDIAL EFFUSION/ PERICARDIAL WINDOW/ ABORTED PERICARDIOCENTESIS  (Right Chest)     Patient location during evaluation: PACU Anesthesia Type: General Level of consciousness: awake Pain management: pain level controlled Cardiovascular status: stable Postop Assessment: no apparent nausea or vomiting Anesthetic complications: no    Last Vitals:  Vitals:   09/01/19 2200 09/01/19 2300  BP: (!) 114/57 (!) 124/101  Pulse:  89  Resp: 13 (!) 22  Temp:    SpO2: 97% 94%    Last Pain:  Vitals:   09/01/19 2000  TempSrc:   PainSc: 4                  Terianne Thaker

## 2019-09-01 NOTE — Evaluation (Signed)
Physical Therapy Evaluation Patient Details Name: Brenda Arnold MRN: [id number] DOB: [birth year] Today's Date: 09/01/2019   History of Present Illness  63 year old lady with prior h/o type 2 DM, hypotension, on midodrine, ESRD on HD, CAD, OSA presents to ED FOR chest pain. Chest pain possibly pericarditis. Recovery complicated by hypotension now s/p R VATS, pericardial window and R chest tube insertion on 08/31/19.  Clinical Impression  Patient presents with decreased independence with mobility due to deficits listed in PT problem list at home.  Patient currently min A of 2 for OOB to chair with nausea limiting ambulation this session.  She lives independently and has been taking herself to dialysis using the walker.  Feel she can achieve mod independent with short stay on CIR to address deficits and progress to independent.  PT to follow acutely.     Follow Up Recommendations CIR    Equipment Recommendations  None recommended by PT    Recommendations for Other Services Rehab consult     Precautions / Restrictions Precautions Precautions: Fall Precaution Comments: R chest tube      Mobility  Bed Mobility Overal bed mobility: Needs Assistance Bed Mobility: Supine to Sit     Supine to sit: Mod assist;HOB elevated     General bed mobility comments: some lifting help for trunk due to pain with movement  Transfers Overall transfer level: Needs assistance Equipment used: Rolling walker (2 wheeled) Transfers: Sit to/from Stand Sit to Stand: Min assist;+2 physical assistance         General transfer comment: min cues for hand placement and assist for balance/due to pain  Ambulation/Gait Ambulation/Gait assistance: Min assist;+2 physical assistance Gait Distance (Feet): 5 Feet Assistive device: Rolling walker (2 wheeled) Gait Pattern/deviations: Step-to pattern;Decreased stride length;Trunk flexed     General Gait Details: limited tolerance due to nausea with  ambulation so brought chair to her to sit  Stairs            Wheelchair Mobility    Modified Rankin (Stroke Patients Only)       Balance Overall balance assessment: Needs assistance Sitting-balance support: Feet supported Sitting balance-Leahy Scale: Fair Sitting balance - Comments: bracing due to pain   Standing balance support: Bilateral upper extremity supported Standing balance-Leahy Scale: Poor Standing balance comment: UE relaint for balance                             Pertinent Vitals/Pain Pain Assessment: Faces Faces Pain Scale: Hurts whole lot Pain Location: R side with movement due to chest tube Pain Descriptors / Indicators: Sore;Sharp;Grimacing;Moaning Pain Intervention(s): Monitored during session;Repositioned;Limited activity within patient's tolerance;Premedicated before session    Home Living Family/patient expects to be discharged to:: Private residence Living Arrangements: Alone   Type of Home: House Home Access: Stairs to enter Entrance Stairs-Rails: None Entrance Stairs-Number of Steps: 1 step up inside from the back Home Layout: One level Home Equipment: Tub bench;Walker - 2 wheels;Cane - single point;Adaptive equipment      Prior Function Level of Independence: Independent with assistive device(s)         Comments: Pt reports completing ADLs using RW for functional mobility, tub bench in the shower, sock aide for socks; drives to dialysis     Hand Dominance   Dominant Hand: Right    Extremity/Trunk Assessment   Upper Extremity Assessment Upper Extremity Assessment: Defer to OT evaluation    Lower Extremity Assessment Lower Extremity Assessment:  Generalized weakness       Communication   Communication: No difficulties  Cognition Arousal/Alertness: Awake/alert Behavior During Therapy: WFL for tasks assessed/performed Overall Cognitive Status: Within Functional Limits for tasks assessed                                         General Comments General comments (skin integrity, edema, etc.): on RA with VSS during mobility    Exercises     Assessment/Plan    PT Assessment Patient needs continued PT services  PT Problem List Decreased strength;Decreased activity tolerance;Decreased mobility;Pain;Impaired sensation;Cardiopulmonary status limiting activity       PT Treatment Interventions DME instruction;Therapeutic activities;Balance training;Neuromuscular re-education;Therapeutic exercise;Functional mobility training;Gait training;Patient/family education    PT Goals (Current goals can be found in the Care Plan section)  Acute Rehab PT Goals Patient Stated Goal: to return to independent PT Goal Formulation: With patient Time For Goal Achievement: 09/15/19 Potential to Achieve Goals: Good    Frequency Min 3X/week   Barriers to discharge        Co-evaluation PT/OT/SLP Co-Evaluation/Treatment: Yes Reason for Co-Treatment: For patient/therapist safety;To address functional/ADL transfers PT goals addressed during session: Mobility/safety with mobility;Balance         AM-PAC PT "6 Clicks" Mobility  Outcome Measure Help needed turning from your back to your side while in a flat bed without using bedrails?: A Little Help needed moving from lying on your back to sitting on the side of a flat bed without using bedrails?: A Lot Help needed moving to and from a bed to a chair (including a wheelchair)?: A Lot Help needed standing up from a chair using your arms (e.g., wheelchair or bedside chair)?: A Lot Help needed to walk in hospital room?: A Lot Help needed climbing 3-5 steps with a railing? : A Lot 6 Click Score: 13    End of Session   Activity Tolerance: Patient limited by fatigue Patient left: in chair;with call bell/phone within reach;with nursing/sitter in room   PT Visit Diagnosis: Other abnormalities of gait and mobility (R26.89);Muscle weakness (generalized)  (M62.81);Pain Pain - Right/Left: Right Pain - part of body: (side)    Time: [id number] PT Time Calculation (min) (ACUTE ONLY): 23 min   Charges:   PT Evaluation $PT Eval Moderate Complexity: [place], [place] Acute Rehabilitation Services [phone] 09/01/2019   [name] 09/01/2019, 1:30 PM

## 2019-09-01 NOTE — Anesthesia Postprocedure Evaluation (Signed)
Anesthesia Post Note  Patient: Agigail Nordine Rohrman  Procedure(s) Performed: VIDEO ASSISTED THORACOSCOPY/ DRAINAGE OF PERICARDIAL EFFUSION/ PERICARDIAL WINDOW/ ABORTED PERICARDIOCENTESIS  (Right Chest)     Patient location during evaluation: PACU Anesthesia Type: General Level of consciousness: awake Pain management: pain level controlled Cardiovascular status: stable Postop Assessment: no apparent nausea or vomiting    Last Vitals:  Vitals:   09/01/19 2200 09/01/19 2300  BP: (!) 114/57 (!) 124/101  Pulse:  89  Resp: 13 (!) 22  Temp:    SpO2: 97% 94%    Last Pain:  Vitals:   09/01/19 2000  TempSrc:   PainSc: 4                  Avamarie Crossley

## 2019-09-01 NOTE — Progress Notes (Signed)
[facility] Progress Note   Subjective:  Went to OR yest for pericardial window, BP's improved when fluid drained in OR per the OP note.    Objective Vitals:   09/01/19 0800 09/01/19 0900 09/01/19 1000 09/01/19 1100  BP:  (!) 94/45 (!) 140/52 (!) 105/18  Pulse: 82  78 79  Resp: 17 19    Temp: 98.7 F (37.1 C)     TempSrc: Oral     SpO2: 97%  92% 90%  Weight:      Height:       Physical Exam General: Well developed, well nourished female, fatigued, obese Heart: RRR, no murmurs, rubs or gallops Lungs: CTA bilaterally without wheezing, rhonchi or rales Abdomen: Soft, non-tender, non-distended. +BS Extremities: No peripheral edema Dialysis Access:  L forearm AVF with two aneurysms+Bruit  Dialysis: [facility] MWF   4h 200Re   450/800   99kg   2/2 bath  Hep 10000  L AVF Parsabiv 15 Calcitriol 0.75 Recent OP Labs: Hgb 12.2 K 4.7 Ca 8.3 Phos 3.9 PTH 1960  Assessment/Plan: 1. Pericardial effusion/ tamponade/ acute pericarditis - EKG with diffuse ST elevation. Echo showed LVEF of 60-65%, normal RV function.  CP improved but BP's worsened and pt taken for pericardial window on 08/31/19. BP's improved w/ drainage of effusion. Has drain in now.  2. Parox atrial fib: Converted to NSR. Transitioning to po amio per cardiology. Eliquis on hold for #1 x 3 days.  3. ESRD:HD MWF. Next HD today.  4. L AVF aneurysms: evaluated by Dr. Donnetta Hutching, reports it is safe to use access and to avoid areas of superficial blistering 5. Hypertension/volume:chronic issue on midodrine 6. Anemia:Hgb 10.3. Not on ESA. Follow.  7. Metabolic bone disease: Continue bindersand calcitriol. On Parasbivfor 2HPTH- not availablein hospital. Resume at discharge.    Kelly Splinter  MD 09/01/2019, 12:06 PM  Additional Objective Labs: Basic Metabolic Panel: Recent Labs  Lab 08/27/19 0830 08/30/19 0700 08/31/19 1123 09/01/19 0357  NA 136 138 138 137  K 4.5 5.2* 5.3* 5.1  CL 96* 97* 97* 100  CO2 28 26  27 24   GLUCOSE 74 175* 118* 100*  BUN 66* 73* 37* 44*  CREATININE 11.84* 14.27* 9.54* 10.38*  CALCIUM 7.9* 8.7* 8.7* 8.5*  PHOS 3.3 2.7 3.5  --    Liver Function Tests: Recent Labs  Lab 08/27/19 0830 08/30/19 0700 08/30/19 1250  AST  --   --  34  ALT  --   --  43  ALKPHOS  --   --  133*  BILITOT  --   --  0.4  PROT  --   --  7.1  ALBUMIN 2.9* 2.6* 3.1*   CBC: Recent Labs  Lab 08/27/19 0831 08/28/19 0256 08/30/19 0641 08/31/19 1123 08/31/19 1532 09/01/19 0357  WBC 8.2 8.0 9.5 9.5 8.0 10.4  NEUTROABS 5.4  --   --   --   --   --   HGB 11.0* 10.5* 10.3* 9.9* 9.6* 9.7*  HCT 34.1* 32.1* 31.9* 31.0* 30.4* 30.2*  MCV 100.0 98.2 100.6* 102.3* 102.7* 102.0*  PLT 202 200 254 313 260 290   Blood Culture    Component Value Date/Time   SDES PERICARDIAL 08/31/2019 1429   SPECREQUEST NONE 08/31/2019 1429   CULT  08/31/2019 1429    NO GROWTH < 24 HOURS Performed at [facility], [place] [address] [place], [place] [address]    REPTSTATUS PENDING 08/31/2019 1429    CBG: Recent Labs  Lab 08/31/19 0743 08/31/19 1219 08/31/19 1516 09/01/19 0756 09/01/19 1123  GLUCAP 102* 112* 107* 103* 100*   Medications: . sodium chloride 35 mL/hr at 09/01/19 1100  .  ceFAZolin (ANCEF) IV    . phenylephrine (NEO-SYNEPHRINE) Adult infusion Stopped (09/01/19 1016)   . amiodarone  400 mg Oral BID  . atorvastatin  40 mg Oral q1800  . calcitRIOL  0.75 mcg Oral Q M,W,F-HD  . Chlorhexidine Gluconate Cloth  6 each Topical Q0600  . colchicine  0.3 mg Oral Q M,W,F-HD  . gabapentin  100 mg Oral QHS  . insulin aspart  0-9 Units Subcutaneous TID WC  . mouth rinse  15 mL Mouth Rinse BID  . midodrine  10 mg Oral TID WC  . multivitamin  1 tablet Oral QHS  . sucroferric oxyhydroxide  1,000 mg Oral TID WC & HS

## 2019-09-01 NOTE — Progress Notes (Signed)
Pt off unit for dialysis. Report given to hemodialysis RN.

## 2019-09-01 NOTE — Progress Notes (Signed)
Pt transferred from chair to bed for transfer to dialysis. Pt reports being in excruciating pain r/t chest tube. Dr. Kipp Brood called, updated. MD stated to give pt dose of 1mg  dilaudid at this time.

## 2019-09-01 NOTE — Progress Notes (Signed)
Rehab Admissions Coordinator Note:  Patient was screened by Cleatrice Burke for appropriateness for an Inpatient Acute Rehab Consult per PT recs.  At this time, we are recommending Inpatient Rehab consult if pt would like to be considered for admit. Please advise.  Cleatrice Burke RN MSN 09/01/2019, 1:52 PM  I can be reached at [phone].

## 2019-09-01 NOTE — Progress Notes (Signed)
Progress Note  Patient Name: Brenda Arnold Date of Encounter: 09/01/2019  Primary Cardiologist: Fransico Him, MD   Subjective   Underwent successful pericardial window yesterday with improvement in blood pressure.  Had hypotension overnight, received albumin and started on phenylephrine, currently at 30.  Reports pain at incision site.  Inpatient Medications    Scheduled Meds: . amiodarone  400 mg Oral BID  . atorvastatin  40 mg Oral q1800  . calcitRIOL  0.75 mcg Oral Q M,W,F-HD  . Chlorhexidine Gluconate Cloth  6 each Topical Q0600  . colchicine  0.3 mg Oral Q M,W,F-HD  . gabapentin  100 mg Oral QHS  . insulin aspart  0-9 Units Subcutaneous TID WC  . mouth rinse  15 mL Mouth Rinse BID  . midodrine  10 mg Oral TID WC  . multivitamin  1 tablet Oral QHS  . sucroferric oxyhydroxide  1,000 mg Oral TID WC & HS   Continuous Infusions: . sodium chloride 35 mL/hr at 09/01/19 0800  .  ceFAZolin (ANCEF) IV    . phenylephrine (NEO-SYNEPHRINE) Adult infusion 40 mcg/min (09/01/19 0800)   PRN Meds: acetaminophen **OR** acetaminophen, HYDROmorphone (DILAUDID) injection, menthol-cetylpyridinium, ondansetron (ZOFRAN) IV, senna-docusate, traMADol   Vital Signs    Vitals:   09/01/19 0500 09/01/19 0600 09/01/19 0700 09/01/19 0800  BP: 97/83 (!) 98/36 129/66   Pulse: 79 76 75 82  Resp: 17 (!) 25 20 17   Temp:    98.7 F (37.1 C)  TempSrc:    Oral  SpO2: 100% 100% 96% 97%  Weight:  104 kg    Height:        Intake/Output Summary (Last 24 hours) at 09/01/2019 0809 Last data filed at 09/01/2019 0800 Gross per 24 hour  Intake 3080.7 ml  Output 365 ml  Net 2715.7 ml   Last 3 Weights 09/01/2019 08/30/2019 08/30/2019  Weight (lbs) 229 lb 4.5 oz 222 lb 0.1 oz 229 lb 4.5 oz  Weight (kg) 104 kg 100.7 kg 104 kg      Telemetry    NSR, HR 780-90s- Personally Reviewed  ECG     No new- Personally Reviewed  Physical Exam   GEN: alert and oriented.  No acute distress.   Neck: no  JVD Cardiac: RRR, no murmurs Respiratory: crackles bilaterally. GI: Soft, nontender, non-distended  MS: No edema; No deformity. Neuro:  Nonfocal  Psych: Normal affect   Labs    High Sensitivity Troponin:   Recent Labs  Lab 08/24/19 1747 08/24/19 2022 08/25/19 0931 08/25/19 1124  TROPONINIHS 7 8 12 10       Chemistry Recent Labs  Lab 08/27/19 0830 08/30/19 0700 08/30/19 1250 08/31/19 1123 09/01/19 0357  NA 136 138  --  138 137  K 4.5 5.2*  --  5.3* 5.1  CL 96* 97*  --  97* 100  CO2 28 26  --  27 24  GLUCOSE 74 175*  --  118* 100*  BUN 66* 73*  --  37* 44*  CREATININE 11.84* 14.27*  --  9.54* 10.38*  CALCIUM 7.9* 8.7*  --  8.7* 8.5*  PROT  --   --  7.1  --   --   ALBUMIN 2.9* 2.6* 3.1*  --   --   AST  --   --  34  --   --   ALT  --   --  43  --   --   ALKPHOS  --   --  133*  --   --  BILITOT  --   --  0.4  --   --   GFRNONAA 3* 2*  --  4* 4*  GFRAA 4* 3*  --  5* 4*  ANIONGAP 12 15  --  14 13     Hematology Recent Labs  Lab 08/31/19 1123 08/31/19 1532 09/01/19 0357  WBC 9.5 8.0 10.4  RBC 3.03* 2.96* 2.96*  HGB 9.9* 9.6* 9.7*  HCT 31.0* 30.4* 30.2*  MCV 102.3* 102.7* 102.0*  MCH 32.7 32.4 32.8  MCHC 31.9 31.6 32.1  RDW 15.9* 15.9* 15.5  PLT 313 260 290    BNPNo results for input(s): BNP, PROBNP in the last 168 hours.   DDimer No results for input(s): DDIMER in the last 168 hours.   Radiology    Dg Chest Port 1 View  Result Date: 08/31/2019 CLINICAL DATA:  Evaluate for pneumothorax. EXAM: PORTABLE CHEST 1 VIEW COMPARISON:  08/25/2019 FINDINGS: Interval placement of right IJ catheter. The tip of the catheter is at the distal SVC. No pneumothorax identified. Cardiac enlargement and aortic atherosclerosis. Bilateral pleural effusions are noted. The right pleural effusion appears increased in volume from previous exam. IMPRESSION: 1. No pneumothorax after right IJ catheter placement. 2. Increase in volume of right pleural effusion. Stable left effusion.  Electronically Signed   By: Kerby Moors M.D.   On: 08/31/2019 15:43    Cardiac Studies   Limited echo 08/31/19:  1. Left ventricular ejection fraction, by visual estimation, is 55 to 60%. The left ventricle has normal function. There is no left ventricular hypertrophy.  2. Global right ventricle was not well visualized.The right ventricular size is not well visualized. Right vetricular wall thickness was not assessed.  3. Left atrial size was normal.  4. Right atrial size was not well visualized.  5. Large pericardial effusion.  6. The mitral valve was not assessed. Not assessed mitral valve regurgitation.  7. The tricuspid valve is not assessed. Tricuspid valve regurgitation was not assessed by color flow Doppler.  8. The aortic valve was not assessed Aortic valve regurgitation was not assessed by color flow Doppler.  9. The pulmonic valve was not well visualized. Pulmonic valve regurgitation was not assessed by color flow Doppler. 10. Aortic root could not be assessed. 11. Limited study for pericardial effusion; large pericardial effusion; no RV diastolic collapse; IVC dilated; cannot R/O early tamponade; echo is larger compared to 08/28/19. 12. The interatrial septum was not assessed.  Limited Echo 08/28/19 1. Left ventricular ejection fraction, by visual estimation, is 60 to 65%. The left ventricle has normal function. Normal left ventricular size. There is no left ventricular hypertrophy. 2. Global right ventricle has normal systolic function.The right ventricular size is normal. No increase in right ventricular wall thickness. 3. Left atrial size was normal. 4. Right atrial size was normal. 5. Moderate pericardial effusion. 6. The pericardial effusion is circumferential. 7. The mitral valve is normal in structure. No evidence of mitral valve regurgitation. No evidence of mitral stenosis. 8. The tricuspid valve is normal in structure. Tricuspid valve regurgitation is trivial.  9. The aortic valve is normal in structure. Aortic valve regurgitation was not visualized by color flow Doppler. Structurally normal aortic valve, with no evidence of sclerosis or stenosis. 10. The pulmonic valve was normal in structure. Pulmonic valve regurgitation is not visualized by color flow Doppler. 11. Normal pulmonary artery systolic pressure. 12. The inferior vena cava is normal in size with greater than 50% respiratory variability, suggesting right atrial pressure of 3  mmHg. 13. Compared to 08/26/2019, the pericardial effusion appears larger, but there is no evidence of tamponade.  Echo 08/26/19 1. Left ventricular ejection fraction, by visual estimation, is 60 to 65%. The left ventricle has normal function. Normal left ventricular size. There is mildly increased left ventricular hypertrophy. 2. Left ventricular diastolic Doppler parameters are consistent with impaired relaxation pattern of LV diastolic filling. 3. The aortic valve is tricuspid Aortic valve regurgitation was not visualized by color flow Doppler. Mild aortic valve sclerosis without stenosis. 4. Left atrial size was normal. 5. Right atrial size was normal. 6. Global right ventricle has normal systolic function.The right ventricular size is normal. No increase in right ventricular wall thickness. 7. Trivial pericardial effusion is present. 8. The tricuspid valve is normal in structure. Tricuspid valve regurgitation was not visualized by color flow Doppler. 9. The mitral valve is normal in structure. No evidence of mitral valve regurgitation. No evidence of mitral stenosis. 10. TR signal is inadequate for assessing pulmonary artery systolic pressure. 11. The inferior vena cava is normal in size with greater than 50% respiratory variability, suggesting right atrial pressure of 3 mmHg.  Patient Profile     63 y.o. female with a hx of DM2, HTN, ESRD on HD, paroxysmal Afib, OSA, obesity, chest pain s/p MV 2011,  myoview 04/2017, w/o ischemia who is being seen for chest pain and abnormal EKG.   Assessment & Plan    Cardiac tamponade: initial presentation consistent with acute pericarditis (pleuritc chest pain, elevated inflammatory markers, diffuse ST elevation on EKG), treated with colchicine.  Initial TTE with trivial effusion.  On 9/29 developed hypotension and repeat TTE showed large effusion with findings concerning for tamponade.  S/p pericardial window with Dr Kipp Brood on 9/29.  Cultures NGTD, cytology pending. - Continue colchicine with HD - Hold Eliquis x 3 days per CT surgery  Paroxysmal afib: started on amio, converted to NSR over weekend.   - Eliquis on hold x 3 days following pericardial window as above - Continue amiodarone for rhythm control.Amio 400 mg x 1 week and then 200 mg for 2 weeks  ESRD on HD: h/o of hypotension following HD, on midodrine  Hyperlipidemia: LDL 115, patient has refused statin  DM2: A1C 4.9.  Diet controlled   For questions or updates, please contact [place] Please consult [email or web address] for contact info under      Signed, Donato Heinz, MD  09/01/2019, 8:09 AM

## 2019-09-01 NOTE — Evaluation (Signed)
Occupational Therapy Evaluation Patient Details Name: Brenda Arnold MRN: [id number] DOB: 1956-07-14 Today's Date: 09/01/2019    History of Present Illness 63 year old lady with prior h/o type 2 DM, hypotension, on midodrine, ESRD on HD, CAD, OSA presents to ED FOR chest pain. Chest pain possibly pericarditis. Recovery complicated by hypotension now s/p R VATS, pericardial window and R chest tube insertion on 08/31/19.   Clinical Impression   Pt was ambulating with a RW, using a tub bench for showering and a sock aid to don her socks prior to admission. Presents with pain at chest tube site, nausea, generalized weakness and impaired standing balance. Pt requires set up to max assist for ADL. She ambulated a short distance with min assist today. Pt likely to progress well with intensive rehab, recommending CIR.    Follow Up Recommendations  CIR;Supervision/Assistance - 24 hour    Equipment Recommendations  3 in 1 bedside commode    Recommendations for Other Services       Precautions / Restrictions Precautions Precautions: Fall Precaution Comments: R chest tube Restrictions Weight Bearing Restrictions: No      Mobility Bed Mobility Overal bed mobility: Needs Assistance Bed Mobility: Supine to Sit     Supine to sit: Mod assist;HOB elevated     General bed mobility comments: some lifting help for trunk due to pain with movement  Transfers Overall transfer level: Needs assistance Equipment used: Rolling walker (2 wheeled) Transfers: Sit to/from Stand Sit to Stand: Min assist;+2 physical assistance         General transfer comment: min cues for hand placement and assist for balance/due to pain    Balance Overall balance assessment: Needs assistance Sitting-balance support: Feet supported Sitting balance-Leahy Scale: Fair Sitting balance - Comments: bracing due to pain   Standing balance support: Bilateral upper extremity supported Standing balance-Leahy Scale:  Poor Standing balance comment: UE relaint for balance                           ADL either performed or assessed with clinical judgement   ADL Overall ADL's : Needs assistance/impaired Eating/Feeding: Independent;Sitting   Grooming: Set up;Sitting   Upper Body Bathing: Minimal assistance;Sitting   Lower Body Bathing: Maximal assistance;Sit to/from stand   Upper Body Dressing : Minimal assistance;Sitting   Lower Body Dressing: Maximal assistance;Sit to/from stand   Toilet Transfer: Minimal assistance;+2 for safety/equipment;Ambulation;BSC;RW   Toileting- Clothing Manipulation and Hygiene: Maximal assistance;Sit to/from stand       Functional mobility during ADLs: Minimal assistance;Rolling walker;+2 for safety/equipment       Vision Baseline Vision/History: Wears glasses Wears Glasses: At all times Patient Visual Report: No change from baseline       Perception     Praxis      Pertinent Vitals/Pain Pain Assessment: Faces Faces Pain Scale: Hurts whole lot Pain Location: R side with movement due to chest tube Pain Descriptors / Indicators: Sore;Sharp;Grimacing;Moaning Pain Intervention(s): Repositioned;Premedicated before session     Hand Dominance Right   Extremity/Trunk Assessment Upper Extremity Assessment Upper Extremity Assessment: Overall WFL for tasks assessed   Lower Extremity Assessment Lower Extremity Assessment: Defer to PT evaluation   Cervical / Trunk Assessment Cervical / Trunk Assessment: Other exceptions Cervical / Trunk Exceptions: obesity   Communication Communication Communication: No difficulties   Cognition Arousal/Alertness: Awake/alert Behavior During Therapy: WFL for tasks assessed/performed Overall Cognitive Status: Within Functional Limits for tasks assessed  General Comments  on RA with VSS during mobility    Exercises     Shoulder Instructions      Home  Living Family/patient expects to be discharged to:: Private residence Living Arrangements: Alone   Type of Home: House Home Access: Stairs to enter [employer] of Steps: 1 step up inside from the back Entrance Stairs-Rails: None Home Layout: One level     Bathroom Shower/Tub: Teacher, early years/pre: Standard     Home Equipment: Tub bench;Walker - 2 wheels;Cane - single point;Adaptive equipment Adaptive Equipment: Sock aid        Prior Functioning/Environment Level of Independence: Independent with assistive device(s)        Comments: Pt reports completing ADLs using RW for functional mobility, tub bench in the shower, sock aide for socks; drives to dialysis        OT Problem List: Decreased strength;Decreased activity tolerance;Impaired balance (sitting and/or standing);Decreased knowledge of use of DME or AE;Pain      OT Treatment/Interventions: Self-care/ADL training;DME and/or AE instruction;Patient/family education;Balance training;Therapeutic activities    OT Goals(Current goals can be found in the care plan section) Acute Rehab OT Goals Patient Stated Goal: to return to independent OT Goal Formulation: With patient Time For Goal Achievement: 09/08/19 Potential to Achieve Goals: Good ADL Goals Pt Will Perform Grooming: with modified independence;standing Pt Will Perform Lower Body Bathing: with modified independence;with adaptive equipment;sit to/from stand Pt Will Perform Lower Body Dressing: with modified independence;with adaptive equipment;sit to/from stand Pt Will Transfer to Toilet: with modified independence;ambulating;bedside commode Pt Will Perform Toileting - Clothing Manipulation and hygiene: with modified independence;sit to/from stand Pt Will Perform Tub/Shower Transfer: with modified independence;tub bench;rolling walker;ambulating Additional ADL Goal #1: pt will perform bed mobility modified independently in preparation for ADL.   OT Frequency: Min 2X/week   Barriers to D/C:            Co-evaluation PT/OT/SLP Co-Evaluation/Treatment: Yes Reason for Co-Treatment: For patient/therapist safety PT goals addressed during session: Mobility/safety with mobility;Balance OT goals addressed during session: ADL's and self-care      AM-PAC OT "6 Clicks" Daily Activity     Outcome Measure Help from another person eating meals?: None Help from another person taking care of personal grooming?: None Help from another person toileting, which includes using toliet, bedpan, or urinal?: A Lot Help from another person bathing (including washing, rinsing, drying)?: A Lot Help from another person to put on and taking off regular upper body clothing?: A Little Help from another person to put on and taking off regular lower body clothing?: Total 6 Click Score: 16   End of Session Equipment Utilized During Treatment: Rolling walker Nurse Communication: Mobility status  Activity Tolerance: Patient limited by pain(and nausea) Patient left: in chair;with call bell/phone within reach;with nursing/sitter in room  OT Visit Diagnosis: Unsteadiness on feet (R26.81);Other abnormalities of gait and mobility (R26.89);Pain;Muscle weakness (generalized) (M62.81)                Time: [id number] OT Time Calculation (min): 21 min Charges:  OT General Charges $OT Visit: 1 Visit OT Evaluation $OT Eval Moderate Complexity: 1 Mod  Nestor Lewandowsky, OTR/L Acute Rehabilitation Services Pager: [phone] Office: [phone]  Malka So 09/01/2019, 2:12 PM

## 2019-09-01 NOTE — Progress Notes (Signed)
eLink Physician-Brief Progress Note Patient Name: Brenda Arnold DOB: 01/15/1956 MRN: [id number]   Date of Service  09/01/2019  HPI/Events of Note  Pt is a known diabetic with ESRD who is now taking po and needs sliding scale insulin coverage.  eICU Interventions  Sensitive CBG/ SSI insulin protocol ordered.        Kerry Kass Mahari Vankirk 09/01/2019, 6:52 AM

## 2019-09-01 NOTE — Anesthesia Preprocedure Evaluation (Addendum)
Anesthesia Evaluation  Patient identified by MRN, date of birth, ID band Patient awake    Airway Mallampati: II  TM Distance: >3 FB     Dental   Pulmonary asthma , sleep apnea , pneumonia,    breath sounds clear to auscultation       Cardiovascular hypertension, + angina + CAD   Rhythm:Regular Rate:Normal     Neuro/Psych    GI/Hepatic   Endo/Other  diabetes  Renal/GU Renal disease     Musculoskeletal  (+) Arthritis ,   Abdominal   Peds  Hematology  (+) anemia ,   Anesthesia Other Findings   Reproductive/Obstetrics                            Anesthesia Physical Anesthesia Plan  ASA: III  Anesthesia Plan: General   Post-op Pain Management:    Induction: Intravenous  PONV Risk Score and Plan: 2 and Treatment may vary due to age or medical condition  Airway Management Planned: Oral ETT  Additional Equipment:   Intra-op Plan:   Post-operative Plan:   Informed Consent: I have reviewed the patients History and Physical, chart, labs and discussed the procedure including the risks, benefits and alternatives for the proposed anesthesia with the patient or authorized representative who has indicated his/her understanding and acceptance.     Dental advisory given  Plan Discussed with: CRNA and Anesthesiologist  Anesthesia Plan Comments:         Anesthesia Quick Evaluation

## 2019-09-01 NOTE — Progress Notes (Signed)
Amiodarone bolus not given by day shift nurse as heart rate dropped from 130's to 80's.. Patient's heart rate currently @ 80's.

## 2019-09-01 NOTE — Progress Notes (Signed)
eLink Physician-Brief Progress Note Patient Name: Brenda Arnold DOB: [birth year] MRN: [id number]   Date of Service  09/01/2019  HPI/Events of Note  Hypotension  eICU Interventions  Stat H & H,  Albumin 250 ml iv bolus x 1        Shade Kaley U Fisher Hargadon 09/01/2019, 3:39 AM

## 2019-09-01 NOTE — Progress Notes (Addendum)
      [address]       [place] [address]             [phone]      1 Day Post-Op Procedure(s) (LRB): VIDEO ASSISTED THORACOSCOPY/ DRAINAGE OF PERICARDIAL EFFUSION/ PERICARDIAL WINDOW/ ABORTED PERICARDIOCENTESIS  (Right)   Subjective:  Breathing better.  Having some pain, gets relief with pain medication, denies N/V  Objective: Vital signs in last 24 hours: Temp:  [97 F (36.1 C)-98.7 F (37.1 C)] 98.7 F (37.1 C) (09/30 0800) Pulse Rate:  [67-116] 82 (09/30 0800) Cardiac Rhythm: Normal sinus rhythm (09/30 0800) Resp:  [7-25] 17 (09/30 0800) BP: (64-129)/(27-83) 129/66 (09/30 0700) SpO2:  [95 %-100 %] 97 % (09/30 0800) Arterial Line BP: (94-155)/(37-62) 115/52 (09/30 0800) Weight:  [104 kg] 104 kg (09/30 0600)  Intake/Output from previous day: 09/29 0701 - 09/30 0700 In: 2744 [I.V.:2011.4; IV Piggyback:732.6] Out: 325 [Blood:5; Chest Tube:320] Intake/Output this shift: Total I/O In: 336.7 [P.O.:240; I.V.:96.7] Out: 40 [Chest Tube:40]  General appearance: alert, cooperative and no distress Heart: regular rate and rhythm Lungs: diminished breath sounds bibasilar Abdomen: soft, non-tender; bowel sounds normal; no masses,  no organomegaly Extremities: extremities normal, atraumatic, no cyanosis or edema Wound: clean and dry  Lab Results: Recent Labs    08/31/19 1532 09/01/19 0357  WBC 8.0 10.4  HGB 9.6* 9.7*  HCT 30.4* 30.2*  PLT 260 290   BMET:  Recent Labs    08/31/19 1123 09/01/19 0357  NA 138 137  K 5.3* 5.1  CL 97* 100  CO2 27 24  GLUCOSE 118* 100*  BUN 37* 44*  CREATININE 9.54* 10.38*  CALCIUM 8.7* 8.5*    PT/INR: No results for input(s): LABPROT, INR in the last 72 hours. ABG    Component Value Date/Time   TCO2 37 (H) 08/24/2019 1802   CBG (last 3)  Recent Labs    08/31/19 1219 08/31/19 1516 09/01/19 0756  GLUCAP 112* 107* 103*    Assessment/Plan: S/P Procedure(s) (LRB): VIDEO ASSISTED THORACOSCOPY/  DRAINAGE OF PERICARDIAL EFFUSION/ PERICARDIAL WINDOW/ ABORTED PERICARDIOCENTESIS  (Right)  1. Chest tube- no air leak, 320 cc output- leave in place until minimal output 2. CV_ NSR, BP is labile on Neo 3. Pulm- no acute issues, continue IS, wean oxygen as able, no CXR ordered for today, will place order to obtain, repeat in AM 4. Expected post operative blood loss anemia, mild Hgb at 9.7 5. Dispo- patient stable, leave chest tubes in place until output is minimal OR gram stain shows no bacteria, cultures, cytology, pathology remain pending, get CXR, care per primary   LOS: 6 days    Junie Panning Barrett PA-C 09/01/2019  Agree with above Continues to have high drain output Will keep chest tube to suction overnight.  Scottie Metayer Bary Leriche

## 2019-09-02 ENCOUNTER — Inpatient Hospital Stay (HOSPITAL_COMMUNITY): Payer: BC Managed Care – PPO

## 2019-09-02 LAB — RENAL FUNCTION PANEL
Albumin: 2.6 g/dL — ABNORMAL LOW (ref 3.5–5.0)
Anion gap: 8 (ref 5–15)
BUN: 18 mg/dL (ref 8–23)
CO2: 28 mmol/L (ref 22–32)
Calcium: 8 mg/dL — ABNORMAL LOW (ref 8.9–10.3)
Chloride: 100 mmol/L (ref 98–111)
Creatinine, Ser: 5.88 mg/dL — ABNORMAL HIGH (ref 0.44–1.00)
GFR calc Af Amer: 8 mL/min — ABNORMAL LOW (ref >60)
GFR calc non Af Amer: 7 mL/min — ABNORMAL LOW (ref >60)
Glucose, Bld: 115 mg/dL — ABNORMAL HIGH (ref 70–99)
Phosphorus: 2.9 mg/dL (ref 2.5–4.6)
Potassium: 4.1 mmol/L (ref 3.5–5.1)
Sodium: 136 mmol/L (ref 135–145)

## 2019-09-02 LAB — CBC
HCT: 29.7 % — ABNORMAL LOW (ref 36.0–46.0)
Hemoglobin: 9.2 g/dL — ABNORMAL LOW (ref 12.0–15.0)
MCH: 31.6 pg (ref 26.0–34.0)
MCHC: 31 g/dL (ref 30.0–36.0)
MCV: 102.1 fL — ABNORMAL HIGH (ref 80.0–100.0)
Platelets: 332 10*3/uL (ref 150–400)
RBC: 2.91 MIL/uL — ABNORMAL LOW (ref 3.87–5.11)
RDW: 15.1 % (ref 11.5–15.5)
WBC: 11.9 10*3/uL — ABNORMAL HIGH (ref 4.0–10.5)
nRBC: 0 % (ref 0.0–0.2)

## 2019-09-02 LAB — GLUCOSE, CAPILLARY
Glucose-Capillary: 90 mg/dL (ref 70–99)
Glucose-Capillary: 107 mg/dL — ABNORMAL HIGH (ref 70–99)
Glucose-Capillary: 114 mg/dL — ABNORMAL HIGH (ref 70–99)

## 2019-09-02 MED ORDER — CHLORHEXIDINE GLUCONATE CLOTH 2 % EX PADS
6.0000 | MEDICATED_PAD | Freq: Every day | CUTANEOUS | Status: DC
  Administered 2019-09-02 – 2019-09-09 (×8): 6 via TOPICAL

## 2019-09-02 NOTE — Progress Notes (Signed)
      [address]       [name] [address]             [phone]                 2 Days Post-Op Procedure(s) (LRB): VIDEO ASSISTED THORACOSCOPY/ DRAINAGE OF PERICARDIAL EFFUSION/ PERICARDIAL WINDOW/ ABORTED PERICARDIOCENTESIS  (Right)   Events: No events _______________________________________________________________ Vitals: BP (!) 103/49 (BP Location: Right Arm)   Pulse 87   Temp 99.1 F (37.3 C) (Oral)   Resp (!) 24   Ht 5\' 6"  (1.676 m)   Wt 104.5 kg   SpO2 96%   BMI 37.18 kg/m   - Neuro: alert NAD  - Cardiovascular: regular  Drips: none.     - Pulm: EWOB FiO2 (%):  [21 %-28 %] 28 %  ABG    Component Value Date/Time   TCO2 37 (H) 08/24/2019 1802    - Abd: soft - Extremity: trace edema  .Intake/Output      09/30 0701 - 10/01 0700 10/01 0701 - 10/02 0700   P.O. 560    I.V. (mL/kg) 965 (9.2)    IV Piggyback 50    Total Intake(mL/kg) 1575 (15.1)    Urine (mL/kg/hr) 0 (0)    Other 400    Blood     Chest Tube 320    Total Output 720    Net +855            _______________________________________________________________ Labs: CBC Latest Ref Rng & Units 09/02/2019 09/01/2019 08/31/2019  WBC 4.0 - 10.5 K/uL 11.9(H) 10.4 8.0  Hemoglobin 12.0 - 15.0 g/dL 9.2(L) 9.7(L) 9.6(L)  Hematocrit 36.0 - 46.0 % 29.7(L) 30.2(L) 30.4(L)  Platelets 150 - 400 K/uL 332 290 260   CMP Latest Ref Rng & Units 09/02/2019 09/01/2019 08/31/2019  Glucose 70 - 99 mg/dL 115(H) 100(H) 118(H)  BUN 8 - 23 mg/dL 18 44(H) 37(H)  Creatinine 0.44 - 1.00 mg/dL 5.88(H) 10.38(H) 9.54(H)  Sodium 135 - 145 mmol/L 136 137 138  Potassium 3.5 - 5.1 mmol/L 4.1 5.1 5.3(H)  Chloride 98 - 111 mmol/L 100 100 97(L)  CO2 22 - 32 mmol/L 28 24 27   Calcium 8.9 - 10.3 mg/dL 8.0(L) 8.5(L) 8.7(L)  Total Protein 6.5 - 8.1 g/dL - - -  Total Bilirubin 0.3 - 1.2 mg/dL - - -  Alkaline Phos 38 - 126 U/L - - -  AST 15 - 41 U/L - - -  ALT 0 - 44 U/L - - -      _______________________________________________________________  Assessment and Plan: POD 2 s/p R VATS, pericardial window  Chest tube output is SS, but remains high.  Likely related to dialysis Since she has a window, will consider repeating echo to ensure complete drainage prior to CT removal Will continue to follow.   Melodie Bouillon, MD 09/02/2019 10:29 AM

## 2019-09-02 NOTE — Progress Notes (Signed)
NAME:  Brenda Arnold, MRN:  [id number], DOB:  [birth year], LOS: 7 ADMISSION DATE:  08/25/2019, CONSULTATION DATE:  08/27/19 REFERRING MD:  Joseph Berkshire, CHIEF COMPLAINT:  Hypotension   Brief History   Mrs.Pietrzak is a 63 yo F w/ PMH of ESRD on HD, PAF presented w/ chest pain.  PCCM previously consulted for hypotension after dialysis (9/23)  9/29 PCCM reconsulted for worsening pericardial effusion, now with worsening hypotension.   Past Medical History  ESRD,OSA,DM2,HTN,PAF,Obesity  Significant Hospital Events   9/25 HD session cut short due to hypotension 9/29 to ICU for cardiogenic shock 2/2 pericardial effusion. AC reversed. Went for pericardial window 9/30 still hypotensive. Received fluid bolus X 2 started on Neo, note systolic blood pressure typically in the 90s at baseline  Consults:  Cardiology, Nephrology  Procedures:  9/29: right VAT w/ pericardial window 19 fr chest tube>>>  Significant Diagnostic Tests:  9/22 CTA chest/abd/pelvis: no acute findings 9/24 ECHO> trivial pericardial effusion. LVEF 60-65% 9/26 ECHO> moderate pericardial effusion LVEF 60-65% 9/29 ECHO> large pericardial effusion. LVEF 55-60%  Micro Data:  9/22 COVID neg  Antimicrobials:  none  Interim history/subjective:  Patient was awake sitting up in bed. Reports improved pain overall,   Objective   Blood pressure 100/69, pulse 83, temperature 99.1 F (37.3 C), temperature source Oral, resp. rate 17, height 5\' 6"  (1.676 m), weight 104.5 kg, SpO2 94 %.    FiO2 (%):  [21 %-28 %] 28 %   Intake/Output Summary (Last 24 hours) at 09/02/2019 0735 Last data filed at 09/02/2019 0700 Gross per 24 hour  Intake 1574.98 ml  Output 720 ml  Net 854.98 ml   Filed Weights   09/01/19 0600 09/01/19 1300 09/01/19 1700  Weight: 104 kg 104.7 kg 104.5 kg   Examination:  General: NAD, sitting in bed  HEENT normocephalic atraumatic no jugular venous distention her mucous membranes are moist Pulm: CTAB, no  accessory musble use, onary clear to auscultation without accessory use.  She is unable to take deep breaths, and breathing is somewhat limited due to inspiratory discomfort from chest tube.  The anterior right chest tube is in satisfactory position with bloody paracardial fluid output Cardic: RRR, no mrg  Extremities warm and dry brisk capillary refill she has a left upper extremity AV graft with strong bruit and thrill no edema strong peripheral pulses with brisk capillary refill Abdomen soft not tender no organomegaly positive bowel sounds no nausea Neuro awake oriented no focal or motor deficits  Resolved Hospital Problem list   N/A  Assessment & Plan:   Large Pericardial Effusion in setting of pericarditis with Tamponade and resultant cardiogenic shock (9/29) now s/p pericardial window 9/29  Plan - Holding eliquis for 3 days - Cont colchicine - CT management per CVTS - F/u cytology   Acute on chronic hypotension. -Typically maintained on midodrine in the outpatient setting baseline systolic blood pressure in the 90s treated during the evening shift with fluid challenges and started on low-dose Neo-Synephrine  Plan - Titrate Neo-Synephrine for systolic blood pressure greater than 90 - Continue Midodrine   Atrial fibrillation with RVR Plan - Cont amiodarone  - Cards following - Cont tele   Pleural effusion (Right > Left) pcxr from 9/29: CM, right IJ good position. Anterior chest tube looks appropriate. Left effusion not changed. Right effusion/atx increased. Cytology neg for malignancy.   Plan - Pulse ox - Oxygen  - Volume removal per renal  - IS  Hx DVT -on home eliquis  Plan Holding for now. Await CVTS   ESRD -MWF iHD  Plan iHD per neprho  Anemia of chronic disease -likely in setting of ESRD -anemia stable s/p window  Plan Trend cbc Holding [facility]   DMII  Plan SSI  Best practice:  Diet: NPO at present. Following procedure, renal/carb modified  Pain/Anxiety/Delirium protocol (if indicated): dilaudid VAP protocol (if indicated): N/A DVT prophylaxis: SCD GI prophylaxis: N/A Glucose control: SSI Mobility: Up w/ assistance Code Status: Full Family Communication: N/A Disposition: ICU; will stay in the intensive care and remains critically ill due to persistent hypotension.  Also risk of bleeding and need for monitoring of pericardial drain.    Renee Rival, MS4

## 2019-09-02 NOTE — Progress Notes (Signed)
NAME:  Brenda Arnold, MRN:  [id number], DOB:  1956/07/27, LOS: 7 ADMISSION DATE:  08/25/2019, CONSULTATION DATE:  08/27/19 REFERRING MD:  Joseph Berkshire, CHIEF COMPLAINT:  Hypotension   Brief History   Brenda Arnold is a 63 yo F w/ PMH of ESRD on HD, PAF presented w/ chest pain.  PCCM previously consulted for hypotension after dialysis (9/23)  9/29 PCCM reconsulted for worsening pericardial effusion, now with worsening hypotension.   Past Medical History  ESRD,OSA,DM2,HTN,PAF,Obesity  Significant Hospital Events   9/25 HD session cut short due to hypotension 9/29 to ICU for cardiogenic shock 2/2 pericardial effusion. AC reversed. Went for pericardial window 9/30 still hypotensive. Received fluid bolus X 2 started on Neo, note systolic blood pressure typically in the 90s at baseline  Consults:  Cardiology, Nephrology  Procedures:  9/29: right VAT w/ pericardial window 19 fr chest tube>>>  Significant Diagnostic Tests:  9/22 CTA chest/abd/pelvis: no acute findings 9/24 ECHO> trivial pericardial effusion. LVEF 60-65% 9/26 ECHO> moderate pericardial effusion LVEF 60-65% 9/29 ECHO> large pericardial effusion. LVEF 55-60%  Micro Data:  9/22 COVID neg  Antimicrobials:  none  Interim history/subjective:  Her chest pain is better tolerated.  She is up in the chair.  She is not in any distress this morning.  Objective   Blood pressure 100/69, pulse 83, temperature 99.1 F (37.3 C), temperature source Oral, resp. rate 17, height 5\' 6"  (1.676 m), weight 104.5 kg, SpO2 94 %.    FiO2 (%):  [21 %-28 %] 28 %   Intake/Output Summary (Last 24 hours) at 09/02/2019 0830 Last data filed at 09/02/2019 0700 Gross per 24 hour  Intake 1238.31 ml  Output 680 ml  Net 558.31 ml   Filed Weights   09/01/19 0600 09/01/19 1300 09/01/19 1700  Weight: 104 kg 104.7 kg 104.5 kg   Examination: General this is a pleasant 63 year old black female she is sitting up in bed and in no acute distress this  morning HEENT normocephalic atraumatic no jugular venous distention.  Right IJ triple-lumen catheter is unremarkable with dressing intact Pulmonary: Clear to auscultation diminished bases no accessory use Cardiac: Regular irregular with atrial fibrillation on telemetry Abdomen: Soft nontender Extremities: Warm and dry brisk capillary refill strong pulses, strong bruit and thrill Neuro: Awake oriented no focal deficits.   Resolved Hospital Problem list   N/A  Assessment & Plan:   Large Pericardial Effusion in setting of pericarditis with Tamponade and resultant cardiogenic shock (9/29) now s/p pericardial window 9/29; cytology negative, cultures negative to date. Plan Chest tube per CVTS Cont colchicine   Acute on chronic hypotension. -Typically maintained on midodrine in the outpatient setting baseline systolic blood pressure in the 90s Plan Continue Midodrine  Atrial fibrillation with RVR;  Required re-bolusing of amiodarone on 9/30 Plan Continue amiodarone; see cardiology note Continue telemetry  Bilateral pleural effusions and atelectasis Portable chest x-ray from 10/1 personally reviewed this demonstrates fairly significant increase in volume of left pleural effusion, worsening right pleural effusion, worsening overall pulmonary edema -Looking at intake/output she is 4.3 L positive; received dialysis on 9/30 -not sure where her weight is up from 104 kg to 106 kg Plan Cont supplemental oxygen  Cont pulse ox Incentive spirometry Need to get more volume off w/ HD on 10/2 Repeat cxr after HD 10/2  Hx DVT -on home eliquis  Plan Holding ac for now; through 10/2  ESRD -MWF iHD Plan iHD per nephrology   Anemia of chronic disease -likely in setting of ESRD -anemia  stable s/p window Plan Holding ac per CVTS  DMII  Plan ssi  Best practice:  Diet: NPO at present. Following procedure, renal/carb modified Pain/Anxiety/Delirium protocol (if indicated): dilaudid  VAP protocol (if indicated): N/A DVT prophylaxis: SCD GI prophylaxis: N/A Glucose control: SSI Mobility: Up w/ assistance Code Status: Full Family Communication: N/A Disposition:  Clinically she is better.  She will stay hast here in the intensive care while she still has her pericardial drain but she is no longer critically ill.  Will ask [facility] to assume care   Brenda Arnold ACNP-BC [place] Pager # [phone] OR # [phone] if no answer

## 2019-09-02 NOTE — Progress Notes (Signed)
Physical Therapy Treatment Patient Details Name: Brenda Arnold MRN: [id number] DOB: [birth year] Today's Date: 09/02/2019    History of Present Illness 63 year old lady with prior h/o type 2 DM, hypotension, on midodrine, ESRD on HD, CAD, OSA presents to ED FOR chest pain. Chest pain possibly pericarditis. Recovery complicated by hypotension now s/p R VATS, pericardial window and R chest tube insertion on 08/31/19.    PT Comments    Patient progressing to hallway ambulation, but easily fatigued and still quite painful.  Continue to feel she will benefit from CIR level rehab prior to d/c home to achieve mod I level.  PT to follow acutely.    Follow Up Recommendations  CIR     Equipment Recommendations  None recommended by PT    Recommendations for Other Services       Precautions / Restrictions Precautions Precautions: Fall Precaution Comments: R chest tube    Mobility  Bed Mobility Overal bed mobility: Needs Assistance Bed Mobility: Supine to Sit     Supine to sit: Mod assist     General bed mobility comments: increased time, cues for technique as first sitting up straight with severe L side pain so came up more from side with assist for trunk  Transfers Overall transfer level: Needs assistance Equipment used: 4-wheeled walker;Bilateral platform walker(eva walker) Transfers: Sit to/from Stand Sit to Stand: Min assist            Ambulation/Gait Ambulation/Gait assistance: Min assist;Mod assist Gait Distance (Feet): 50 Feet Assistive device: 4-wheeled walker;Bilateral platform walker(eva walker) Gait Pattern/deviations: Step-to pattern;Step-through pattern;Decreased stride length;Trunk flexed     General Gait Details: leaning on walker and slow pace assist to stabilize walker at times for straight path   Stairs             Wheelchair Mobility    Modified Rankin (Stroke Patients Only)       Balance Overall balance assessment: Needs  assistance   Sitting balance-Leahy Scale: Fair     Standing balance support: Bilateral upper extremity supported Standing balance-Leahy Scale: Poor Standing balance comment: UE relaint for balance                            Cognition Arousal/Alertness: Awake/alert Behavior During Therapy: WFL for tasks assessed/performed Overall Cognitive Status: Within Functional Limits for tasks assessed                                        Exercises      General Comments General comments (skin integrity, edema, etc.): on 2L O2 with ambulation      Pertinent Vitals/Pain Pain Assessment: Faces Faces Pain Scale: Hurts whole lot Pain Location: R side with movement due to chest tube Pain Descriptors / Indicators: Sore;Sharp;Grimacing;Moaning Pain Intervention(s): Monitored during session;Repositioned    Home Living                      Prior Function            PT Goals (current goals can now be found in the care plan section) Progress towards PT goals: Progressing toward goals    Frequency    Min 3X/week      PT Plan Current plan remains appropriate    Co-evaluation              AM-PAC  PT "6 Clicks" Mobility   Outcome Measure  Help needed turning from your back to your side while in a flat bed without using bedrails?: A Little Help needed moving from lying on your back to sitting on the side of a flat bed without using bedrails?: A Lot Help needed moving to and from a bed to a chair (including a wheelchair)?: A Little Help needed standing up from a chair using your arms (e.g., wheelchair or bedside chair)?: A Lot Help needed to walk in hospital room?: A Lot Help needed climbing 3-5 steps with a railing? : A Lot 6 Click Score: 14    End of Session Equipment Utilized During Treatment: Oxygen Activity Tolerance: Patient limited by fatigue Patient left: in chair;with call bell/phone within reach   PT Visit Diagnosis: Other  abnormalities of gait and mobility (R26.89);Muscle weakness (generalized) (M62.81);Pain Pain - Right/Left: Right Pain - part of body: (side)     Time: [id number] PT Time Calculation (min) (ACUTE ONLY): 24 min  Charges:  $Gait Training: 8-22 mins $Therapeutic Activity: 8-22 mins                     Magda Kiel, PT Acute Rehabilitation Services [phone] 09/02/2019    [name] 09/02/2019, 6:09 PM

## 2019-09-02 NOTE — Progress Notes (Signed)
Progress Note  Patient Name: Brenda Arnold Date of Encounter: 09/02/2019  Primary Cardiologist: Fransico Him, MD   Subjective   Reports chest pain significantly improved today.  Off pressors.  Inpatient Medications    Scheduled Meds: . amiodarone  400 mg Oral BID  . atorvastatin  40 mg Oral q1800  . calcitRIOL  0.75 mcg Oral Q M,W,F-HD  . Chlorhexidine Gluconate Cloth  6 each Topical Q0600  . colchicine  0.3 mg Oral Q M,W,F-HD  . gabapentin  100 mg Oral QHS  . insulin aspart  0-9 Units Subcutaneous TID WC  . mouth rinse  15 mL Mouth Rinse BID  . midodrine  10 mg Oral TID WC  . multivitamin  1 tablet Oral QHS  . sucroferric oxyhydroxide  1,000 mg Oral TID WC & HS   Continuous Infusions: . sodium chloride    . sodium chloride    . sodium chloride 30 mL/hr at 09/02/19 0700  . amiodarone    . phenylephrine (NEO-SYNEPHRINE) Adult infusion Stopped (09/02/19 0400)   PRN Meds: sodium chloride, sodium chloride, acetaminophen **OR** acetaminophen, alteplase, heparin, HYDROmorphone (DILAUDID) injection, lidocaine (PF), lidocaine-prilocaine, menthol-cetylpyridinium, ondansetron (ZOFRAN) IV, pentafluoroprop-tetrafluoroeth, senna-docusate, traMADol   Vital Signs    Vitals:   09/02/19 0400 09/02/19 0500 09/02/19 0600 09/02/19 0700  BP: 115/80 (!) 93/40 (!) 99/50 100/69  Pulse: 78 73 73 83  Resp: (!) 22 14 15 17   Temp: 99.1 F (37.3 C)     TempSrc: Oral     SpO2: 99% 100% 99% 94%  Weight:      Height:        Intake/Output Summary (Last 24 hours) at 09/02/2019 0807 Last data filed at 09/02/2019 0700 Gross per 24 hour  Intake 1238.31 ml  Output 680 ml  Net 558.31 ml   Last 3 Weights 09/01/2019 09/01/2019 09/01/2019  Weight (lbs) 230 lb 6.1 oz 230 lb 13.2 oz 229 lb 4.5 oz  Weight (kg) 104.5 kg 104.7 kg 104 kg      Telemetry    NSR in 80s currently.  Yesterday evening with runs of AF with HR 120s lasting up to 10 minutes- Personally Reviewed  ECG     No new-  Personally Reviewed  Physical Exam   GEN: alert and oriented.  No acute distress.   Neck: no JVD Cardiac: RRR, no murmurs Respiratory: CTAB GI: Soft, nontender, non-distended  MS: No edema; No deformity. Neuro:  Nonfocal  Psych: Normal affect   Labs    High Sensitivity Troponin:   Recent Labs  Lab 08/24/19 1747 08/24/19 2022 08/25/19 0931 08/25/19 1124  TROPONINIHS 7 8 12 10       Chemistry Recent Labs  Lab 08/30/19 0700 08/30/19 1250 08/31/19 1123 09/01/19 0357 09/02/19 0219  NA 138  --  138 137 136  K 5.2*  --  5.3* 5.1 4.1  CL 97*  --  97* 100 100  CO2 26  --  27 24 28   GLUCOSE 175*  --  118* 100* 115*  BUN 73*  --  37* 44* 18  CREATININE 14.27*  --  9.54* 10.38* 5.88*  CALCIUM 8.7*  --  8.7* 8.5* 8.0*  PROT  --  7.1  --   --   --   ALBUMIN 2.6* 3.1*  --   --  2.6*  AST  --  34  --   --   --   ALT  --  43  --   --   --  ALKPHOS  --  133*  --   --   --   BILITOT  --  0.4  --   --   --   GFRNONAA 2*  --  4* 4* 7*  GFRAA 3*  --  5* 4* 8*  ANIONGAP 15  --  14 13 8      Hematology Recent Labs  Lab 08/31/19 1532 09/01/19 0357 09/02/19 0219  WBC 8.0 10.4 11.9*  RBC 2.96* 2.96* 2.91*  HGB 9.6* 9.7* 9.2*  HCT 30.4* 30.2* 29.7*  MCV 102.7* 102.0* 102.1*  MCH 32.4 32.8 31.6  MCHC 31.6 32.1 31.0  RDW 15.9* 15.5 15.1  PLT 260 290 332    BNPNo results for input(s): BNP, PROBNP in the last 168 hours.   DDimer No results for input(s): DDIMER in the last 168 hours.   Radiology    Dg Chest Port 1 View  Result Date: 09/01/2019 CLINICAL DATA:  Pericardial effusion EXAM: PORTABLE CHEST 1 VIEW COMPARISON:  08/31/2019 FINDINGS: Large left pleural effusion is increased since prior study. Small layering right pleural effusion. Bilateral lower lobe airspace opacities, stable on the right and increasing on the left. Right central line is unchanged. IMPRESSION: Worsening left pleural effusion and left lower lobe airspace disease. Stable small right effusion and  right lower lobe atelectasis or infiltrate. Electronically Signed   By: Rolm Baptise M.D.   On: 09/01/2019 09:26   Dg Chest Port 1 View  Result Date: 08/31/2019 CLINICAL DATA:  Evaluate for pneumothorax. EXAM: PORTABLE CHEST 1 VIEW COMPARISON:  08/25/2019 FINDINGS: Interval placement of right IJ catheter. The tip of the catheter is at the distal SVC. No pneumothorax identified. Cardiac enlargement and aortic atherosclerosis. Bilateral pleural effusions are noted. The right pleural effusion appears increased in volume from previous exam. IMPRESSION: 1. No pneumothorax after right IJ catheter placement. 2. Increase in volume of right pleural effusion. Stable left effusion. Electronically Signed   By: Kerby Moors M.D.   On: 08/31/2019 15:43    Cardiac Studies   Limited echo 08/31/19:  1. Left ventricular ejection fraction, by visual estimation, is 55 to 60%. The left ventricle has normal function. There is no left ventricular hypertrophy.  2. Global right ventricle was not well visualized.The right ventricular size is not well visualized. Right vetricular wall thickness was not assessed.  3. Left atrial size was normal.  4. Right atrial size was not well visualized.  5. Large pericardial effusion.  6. The mitral valve was not assessed. Not assessed mitral valve regurgitation.  7. The tricuspid valve is not assessed. Tricuspid valve regurgitation was not assessed by color flow Doppler.  8. The aortic valve was not assessed Aortic valve regurgitation was not assessed by color flow Doppler.  9. The pulmonic valve was not well visualized. Pulmonic valve regurgitation was not assessed by color flow Doppler. 10. Aortic root could not be assessed. 11. Limited study for pericardial effusion; large pericardial effusion; no RV diastolic collapse; IVC dilated; cannot R/O early tamponade; echo is larger compared to 08/28/19. 12. The interatrial septum was not assessed.  Limited Echo 08/28/19 1. Left  ventricular ejection fraction, by visual estimation, is 60 to 65%. The left ventricle has normal function. Normal left ventricular size. There is no left ventricular hypertrophy. 2. Global right ventricle has normal systolic function.The right ventricular size is normal. No increase in right ventricular wall thickness. 3. Left atrial size was normal. 4. Right atrial size was normal. 5. Moderate pericardial effusion. 6. The pericardial  effusion is circumferential. 7. The mitral valve is normal in structure. No evidence of mitral valve regurgitation. No evidence of mitral stenosis. 8. The tricuspid valve is normal in structure. Tricuspid valve regurgitation is trivial. 9. The aortic valve is normal in structure. Aortic valve regurgitation was not visualized by color flow Doppler. Structurally normal aortic valve, with no evidence of sclerosis or stenosis. 10. The pulmonic valve was normal in structure. Pulmonic valve regurgitation is not visualized by color flow Doppler. 11. Normal pulmonary artery systolic pressure. 12. The inferior vena cava is normal in size with greater than 50% respiratory variability, suggesting right atrial pressure of 3 mmHg. 13. Compared to 08/26/2019, the pericardial effusion appears larger, but there is no evidence of tamponade.  Echo 08/26/19 1. Left ventricular ejection fraction, by visual estimation, is 60 to 65%. The left ventricle has normal function. Normal left ventricular size. There is mildly increased left ventricular hypertrophy. 2. Left ventricular diastolic Doppler parameters are consistent with impaired relaxation pattern of LV diastolic filling. 3. The aortic valve is tricuspid Aortic valve regurgitation was not visualized by color flow Doppler. Mild aortic valve sclerosis without stenosis. 4. Left atrial size was normal. 5. Right atrial size was normal. 6. Global right ventricle has normal systolic function.The right ventricular size is  normal. No increase in right ventricular wall thickness. 7. Trivial pericardial effusion is present. 8. The tricuspid valve is normal in structure. Tricuspid valve regurgitation was not visualized by color flow Doppler. 9. The mitral valve is normal in structure. No evidence of mitral valve regurgitation. No evidence of mitral stenosis. 10. TR signal is inadequate for assessing pulmonary artery systolic pressure. 11. The inferior vena cava is normal in size with greater than 50% respiratory variability, suggesting right atrial pressure of 3 mmHg.  Patient Profile     63 y.o. female with a hx of DM2, HTN, ESRD on HD, paroxysmal Afib, OSA, obesity, chest pain s/p MV 2011, myoview 04/2017, w/o ischemia who is being seen for chest pain and abnormal EKG.   Assessment & Plan    Cardiac tamponade: initial presentation consistent with acute pericarditis (pleuritc chest pain, elevated inflammatory markers, diffuse ST elevation on EKG), treated with colchicine.  Initial TTE with trivial effusion.  On 9/29 developed hypotension and repeat TTE showed large effusion with findings concerning for tamponade.  S/p pericardial window with Dr Kipp Brood on 9/29.  Cultures NGTD, cytology with no malignant cells - Continue colchicine with HD - Holding Eliquis x 3 days (thru 10/2) per CT surgery  Paroxysmal afib: on Eliquis.  Recurrent AF this admission, converted to NSR with amio.  Continues to have short runs of AF - Eliquis on hold x 3 days following pericardial window as above - Continue amiodarone for rhythm control.  Amio 400 mg BID x 1 week (thru 10/3) and then 200 mg BID for 2 weeks then 200 mg daily  ESRD on HD: h/o of hypotension following HD, on midodrine  Hyperlipidemia: LDL 115, patient has refused statin  DM2: A1C 4.9.  Diet controlled   For questions or updates, please contact [place] Please consult [email or web address] for contact info under      Signed, Donato Heinz, MD   09/02/2019, 8:07 AM

## 2019-09-02 NOTE — Addendum Note (Signed)
Addendum  created 09/02/19 1226 by Kyung Rudd, CRNA   Clinical Note Signed, Intraprocedure Blocks edited

## 2019-09-02 NOTE — Progress Notes (Addendum)
[facility] Progress Note   Subjective:  Stable , up in chair today, no new c/o  Objective Vitals:   09/02/19 0800 09/02/19 0900 09/02/19 1000 09/02/19 1100  BP: (!) 103/49 (!) 99/56 (!) 99/54 (!) 102/54  Pulse: 87 84 81 78  Resp: (!) 24 18 11 14   Temp:      TempSrc:      SpO2: 96% 93% 92% 100%  Weight:      Height:       Physical Exam General: Well developed, well nourished female, fatigued, obese Heart: RRR, no murmurs, rubs or gallops Lungs: CTA bilaterally without wheezing, rhonchi or rales Abdomen: Soft, non-tender, non-distended. +BS Extremities: No LE edema Dialysis Access:  L forearm AVF with two aneurysms+Bruit  Dialysis: [facility] MWF   4h 200Re   450/800   99kg   2/2 bath  Hep 10000  L AVF Parsabiv 15 Calcitriol 0.75 Recent OP Labs: Hgb 12.2 K 4.7 Ca 8.3 Phos 3.9 PTH 1960  Assessment/Plan: 1. Pericardial effusion/ tamponade/ acute pericarditis - EKG with diffuse ST elevation. Echo showed LVEF of 60-65%, normal RV function.  CP improved but hypotension worsened and pt taken for pericardial window on 08/31/19. BP's improved w/ drainage of effusion. Has drain in now. Per Card surg and cardiology.  2. Parox atrial fib: Converted to NSR. Transitioning to po amio. Eliquis on hold for 3 days related to #1.  3. ESRD:HD MWF. Next HD Friday. NO HEPARIN due to tamponade 4. L AVF aneurysms: evaluated by Dr. Donnetta Hutching, reports it is safe to use access and to avoid areas of superficial blistering 5. Hypertension/volume:chronic BP issue on midodrine. Up 4-5kg 6. Anemia:Hgb 9- 10 . Not on ESA. Follow.  7. Metabolic bone disease: Continue bindersand calcitriol. On Parasbivfor 2HPTH- not availablein hospital. Resume at discharge.    Kelly Splinter  MD 09/01/2019, 12:06 PM  Additional Objective Labs: Basic Metabolic Panel: Recent Labs  Lab 08/30/19 0700 08/31/19 1123 09/01/19 0357 09/02/19 0219  NA 138 138 137 136  K 5.2* 5.3* 5.1 4.1  CL 97* 97* 100 100   CO2 26 27 24 28   GLUCOSE 175* 118* 100* 115*  BUN 73* 37* 44* 18  CREATININE 14.27* 9.54* 10.38* 5.88*  CALCIUM 8.7* 8.7* 8.5* 8.0*  PHOS 2.7 3.5  --  2.9   Liver Function Tests: Recent Labs  Lab 08/30/19 0700 08/30/19 1250 09/02/19 0219  AST  --  34  --   ALT  --  43  --   ALKPHOS  --  133*  --   BILITOT  --  0.4  --   PROT  --  7.1  --   ALBUMIN 2.6* 3.1* 2.6*   CBC: Recent Labs  Lab 08/27/19 0831  08/30/19 0641 08/31/19 1123 08/31/19 1532 09/01/19 0357 09/02/19 0219  WBC 8.2   < > 9.5 9.5 8.0 10.4 11.9*  NEUTROABS 5.4  --   --   --   --   --   --   HGB 11.0*   < > 10.3* 9.9* 9.6* 9.7* 9.2*  HCT 34.1*   < > 31.9* 31.0* 30.4* 30.2* 29.7*  MCV 100.0   < > 100.6* 102.3* 102.7* 102.0* 102.1*  PLT 202   < > 254 313 260 290 332   < > = values in this interval not displayed.   Blood Culture    Component Value Date/Time   SDES PERICARDIAL 08/31/2019 [place] 08/31/2019 1429   CULT  08/31/2019  1429    NO GROWTH < 24 HOURS Performed at [facility], [place] [address] [place], [place] [address]    REPTSTATUS PENDING 08/31/2019 1429    CBG: Recent Labs  Lab 09/01/19 0756 09/01/19 1123 09/01/19 1748 09/01/19 2147 09/02/19 0653  GLUCAP 103* 100* 91 92 90   Medications: . sodium chloride    . sodium chloride    . sodium chloride 30 mL/hr at 09/02/19 0700  . amiodarone    . phenylephrine (NEO-SYNEPHRINE) Adult infusion Stopped (09/02/19 0400)   . amiodarone  400 mg Oral BID  . atorvastatin  40 mg Oral q1800  . calcitRIOL  0.75 mcg Oral Q M,W,F-HD  . Chlorhexidine Gluconate Cloth  6 each Topical Q0600  . colchicine  0.3 mg Oral Q M,W,F-HD  . gabapentin  100 mg Oral QHS  . insulin aspart  0-9 Units Subcutaneous TID WC  . mouth rinse  15 mL Mouth Rinse BID  . midodrine  10 mg Oral TID WC  . multivitamin  1 tablet Oral QHS  . sucroferric oxyhydroxide  1,000 mg Oral TID WC & HS

## 2019-09-02 NOTE — Plan of Care (Signed)
  Problem: Education: Goal: Understanding of cardiac disease, CV risk reduction, and recovery process will improve Outcome: Progressing   Problem: Activity: Goal: Ability to tolerate increased activity will improve Outcome: Progressing   Problem: Cardiac: Goal: Ability to achieve and maintain adequate cardiovascular perfusion will improve Outcome: Progressing   Problem: Health Behavior/Discharge Planning: Goal: Ability to safely manage health-related needs after discharge will improve Outcome: Progressing   Problem: Education: Goal: Knowledge of General Education information will improve Description: Including pain rating scale, medication(s)/side effects and non-pharmacologic comfort measures Outcome: Progressing   Problem: Health Behavior/Discharge Planning: Goal: Ability to manage health-related needs will improve Outcome: Progressing   Problem: Clinical Measurements: Goal: Ability to maintain clinical measurements within normal limits will improve Outcome: Progressing

## 2019-09-02 NOTE — Plan of Care (Signed)
  Problem: Education: Goal: Understanding of cardiac disease, CV risk reduction, and recovery process will improve Outcome: Progressing Goal: Individualized Educational Video(s) Outcome: Progressing   Problem: Activity: Goal: Ability to tolerate increased activity will improve Outcome: Progressing   Problem: Cardiac: Goal: Ability to achieve and maintain adequate cardiovascular perfusion will improve Outcome: Progressing   Problem: Health Behavior/Discharge Planning: Goal: Ability to safely manage health-related needs after discharge will improve Outcome: Progressing   Problem: Education: Goal: Knowledge of General Education information will improve Description: Including pain rating scale, medication(s)/side effects and non-pharmacologic comfort measures Outcome: Progressing   Problem: Health Behavior/Discharge Planning: Goal: Ability to manage health-related needs will improve Outcome: Progressing   Problem: Activity: Goal: Risk for activity intolerance will decrease Outcome: Progressing   Problem: Nutrition: Goal: Adequate nutrition will be maintained Outcome: Progressing   Problem: Elimination: Goal: Will not experience complications related to bowel motility Outcome: Progressing   Problem: Pain Managment: Goal: General experience of comfort will improve Outcome: Progressing   Problem: Safety: Goal: Ability to remain free from injury will improve Outcome: Progressing   Problem: Skin Integrity: Goal: Risk for impaired skin integrity will decrease Outcome: Progressing

## 2019-09-03 ENCOUNTER — Inpatient Hospital Stay (HOSPITAL_COMMUNITY): Payer: BC Managed Care – PPO

## 2019-09-03 DIAGNOSIS — I313 Pericardial effusion (noninflammatory): Secondary | ICD-10-CM

## 2019-09-03 LAB — RENAL FUNCTION PANEL
Albumin: 2.4 g/dL — ABNORMAL LOW (ref 3.5–5.0)
Anion gap: 11 (ref 5–15)
BUN: 30 mg/dL — ABNORMAL HIGH (ref 8–23)
CO2: 26 mmol/L (ref 22–32)
Calcium: 8.1 mg/dL — ABNORMAL LOW (ref 8.9–10.3)
Chloride: 99 mmol/L (ref 98–111)
Creatinine, Ser: 8.74 mg/dL — ABNORMAL HIGH (ref 0.44–1.00)
GFR calc Af Amer: 5 mL/min — ABNORMAL LOW (ref >60)
GFR calc non Af Amer: 4 mL/min — ABNORMAL LOW (ref >60)
Glucose, Bld: 110 mg/dL — ABNORMAL HIGH (ref 70–99)
Phosphorus: 3.3 mg/dL (ref 2.5–4.6)
Potassium: 4.1 mmol/L (ref 3.5–5.1)
Sodium: 136 mmol/L (ref 135–145)

## 2019-09-03 LAB — CBC WITH DIFFERENTIAL/PLATELET
Abs Immature Granulocytes: 0.08 10*3/uL — ABNORMAL HIGH (ref 0.00–0.07)
Basophils Absolute: 0 10*3/uL (ref 0.0–0.1)
Basophils Relative: 0 %
Eosinophils Absolute: 0.1 10*3/uL (ref 0.0–0.5)
Eosinophils Relative: 1 %
HCT: 29.6 % — ABNORMAL LOW (ref 36.0–46.0)
Hemoglobin: 9.4 g/dL — ABNORMAL LOW (ref 12.0–15.0)
Immature Granulocytes: 1 %
Lymphocytes Relative: 9 %
Lymphs Abs: 1 10*3/uL (ref 0.7–4.0)
MCH: 32.3 pg (ref 26.0–34.0)
MCHC: 31.8 g/dL (ref 30.0–36.0)
MCV: 101.7 fL — ABNORMAL HIGH (ref 80.0–100.0)
Monocytes Absolute: 1.3 10*3/uL — ABNORMAL HIGH (ref 0.1–1.0)
Monocytes Relative: 11 %
Neutro Abs: 8.7 10*3/uL — ABNORMAL HIGH (ref 1.7–7.7)
Neutrophils Relative %: 78 %
Platelets: 356 10*3/uL (ref 150–400)
RBC: 2.91 MIL/uL — ABNORMAL LOW (ref 3.87–5.11)
RDW: 15.2 % (ref 11.5–15.5)
WBC: 11.2 10*3/uL — ABNORMAL HIGH (ref 4.0–10.5)
nRBC: 0 % (ref 0.0–0.2)

## 2019-09-03 LAB — GLUCOSE, CAPILLARY
Glucose-Capillary: 127 mg/dL — ABNORMAL HIGH (ref 70–99)
Glucose-Capillary: 83 mg/dL (ref 70–99)
Glucose-Capillary: 103 mg/dL — ABNORMAL HIGH (ref 70–99)
Glucose-Capillary: 138 mg/dL — ABNORMAL HIGH (ref 70–99)
Glucose-Capillary: 133 mg/dL — ABNORMAL HIGH (ref 70–99)

## 2019-09-03 LAB — ECHOCARDIOGRAM LIMITED
Height: 66 in
Weight: 3809.55 oz

## 2019-09-03 LAB — IRON AND TIBC
Iron: 30 ug/dL (ref 28–170)
Saturation Ratios: 20 % (ref 10.4–31.8)
TIBC: 151 ug/dL — ABNORMAL LOW (ref 250–450)
UIBC: 121 ug/dL

## 2019-09-03 LAB — MAGNESIUM: Magnesium: 1.9 mg/dL (ref 1.7–2.4)

## 2019-09-03 MED ORDER — DARBEPOETIN ALFA 60 MCG/0.3ML IJ SOSY: 60.0000 ug | PREFILLED_SYRINGE | INTRAMUSCULAR | Status: DC

## 2019-09-03 MED ORDER — AMIODARONE HCL 200 MG PO TABS: 200.0000 mg | ORAL_TABLET | Freq: Every day | ORAL | Status: DC

## 2019-09-03 MED ORDER — AMIODARONE HCL 200 MG PO TABS: 200.0000 mg | ORAL_TABLET | Freq: Two times a day (BID) | ORAL | Status: DC

## 2019-09-03 MED ORDER — POLYETHYLENE GLYCOL 3350 17 G PO PACK
17.0000 g | PACK | Freq: Every day | ORAL | Status: DC
  Administered 2019-09-03 – 2019-09-04 (×2): 17 g via ORAL
  Filled 2019-09-03 (×2): qty 1

## 2019-09-03 MED ORDER — AMIODARONE HCL 200 MG PO TABS
200.0000 mg | ORAL_TABLET | Freq: Two times a day (BID) | ORAL | Status: DC
  Administered 2019-09-05 – 2019-09-09 (×9): 200 mg via ORAL
  Filled 2019-09-03 (×9): qty 1

## 2019-09-03 MED ORDER — DOCUSATE SODIUM 100 MG PO CAPS
100.0000 mg | ORAL_CAPSULE | Freq: Two times a day (BID) | ORAL | Status: DC
  Administered 2019-09-03 – 2019-09-09 (×13): 100 mg via ORAL
  Filled 2019-09-03 (×13): qty 1

## 2019-09-03 MED ORDER — AMIODARONE HCL 200 MG PO TABS
400.0000 mg | ORAL_TABLET | Freq: Two times a day (BID) | ORAL | Status: AC
  Administered 2019-09-03 – 2019-09-04 (×3): 400 mg via ORAL
  Filled 2019-09-03 (×3): qty 2

## 2019-09-03 NOTE — Progress Notes (Signed)
[facility] Progress Note   Subjective:  Stable , up in chair today, no new c/o  Objective Vitals:   09/03/19 0817 09/03/19 0900 09/03/19 1000 09/03/19 1100  BP: (!) 103/55 (!) 103/53 (!) 97/51 (!) 106/59  Pulse: 84 79 81 78  Resp: 20 (!) 24 (!) 21 (!) 30  Temp:    99.3 F (37.4 C)  TempSrc:      SpO2: 100% 92% 96% 100%  Weight:      Height:       Physical Exam General: Well developed, well nourished female, fatigued, obese Heart: RRR, no murmurs, rubs or gallops Lungs: CTA bilaterally without wheezing, rhonchi or rales Abdomen: Soft, non-tender, non-distended. +BS Extremities: No LE edema Dialysis Access:  L forearm AVF with two aneurysms+Bruit  Dialysis: [facility] MWF   4h 200Re   450/800   99kg   2/2 bath  Hep 10000  L AVF Parsabiv 15 Calcitriol 0.75 Recent OP Labs: Hgb 12.2 K 4.7 Ca 8.3 Phos 3.9 PTH 1960  Assessment/Plan: 1. Pericardial effusion/ tamponade/ acute pericarditis - EKG with diffuse ST elevation. Echo showed LVEF of 60-65%, normal RV function.  CP improved but hypotension worsened and pt taken for pericardial window on 08/31/19. BP's improved w/ drainage of effusion. Has drain in now. Per Card surg and cardiology.  2. Parox atrial fib: Converted to NSR. Transitioning to po amio. Eliquis on hold for 3 days related to #1.  3. ESRD:HD MWF. Next HD Friday. NO HEPARIN due to tamponade 4. L AVF aneurysms: evaluated by Dr. Donnetta Hutching, reports it is safe to use access and to avoid areas of superficial blistering 5. Hypertension/volume:chronic BP issue on midodrine. Up 7-8kg by wt's, some vol excess on exam. UF 2-3L today. Rewrote for fluid restriction.  6. Anemia:Hgb 9- 10 . Hb 9.2, will start on darbe 60 /wk  7. Metabolic bone disease: Continue bindersand calcitriol. On Parasbivfor 2HPTH- not availablein hospital. Resume at discharge.    Kelly Splinter  MD 09/01/2019, 12:06 PM  Additional Objective Labs: Basic Metabolic Panel: Recent Labs  Lab  08/31/19 1123 09/01/19 0357 09/02/19 0219 09/03/19 1047  NA 138 137 136 136  K 5.3* 5.1 4.1 4.1  CL 97* 100 100 99  CO2 27 24 28 26   GLUCOSE 118* 100* 115* 110*  BUN 37* 44* 18 30*  CREATININE 9.54* 10.38* 5.88* 8.74*  CALCIUM 8.7* 8.5* 8.0* 8.1*  PHOS 3.5  --  2.9 3.3   Liver Function Tests: Recent Labs  Lab 08/30/19 1250 09/02/19 0219 09/03/19 1047  AST 34  --   --   ALT 43  --   --   ALKPHOS 133*  --   --   BILITOT 0.4  --   --   PROT 7.1  --   --   ALBUMIN 3.1* 2.6* 2.4*   CBC: Recent Labs  Lab 08/31/19 1123 08/31/19 1532 09/01/19 0357 09/02/19 0219 09/03/19 1047  WBC 9.5 8.0 10.4 11.9* 11.2*  NEUTROABS  --   --   --   --  8.7*  HGB 9.9* 9.6* 9.7* 9.2* 9.4*  HCT 31.0* 30.4* 30.2* 29.7* 29.6*  MCV 102.3* 102.7* 102.0* 102.1* 101.7*  PLT 313 260 290 332 356   Blood Culture    Component Value Date/Time   SDES PERICARDIAL 08/31/2019 1429   SPECREQUEST NONE 08/31/2019 1429   CULT  08/31/2019 1429    NO GROWTH 3 DAYS NO ANAEROBES ISOLATED; CULTURE IN PROGRESS FOR 5 DAYS Performed at [facility]  [facility], [place] [address] [place], [place] [address]    REPTSTATUS PENDING 08/31/2019 1429    CBG: Recent Labs  Lab 09/02/19 1640 09/02/19 2303 09/03/19 0619 09/03/19 0755 09/03/19 1215  GLUCAP 114* 127* 83 103* 138*   Medications: . sodium chloride    . sodium chloride    . sodium chloride 35 mL/hr at 09/03/19 0600  . phenylephrine (NEO-SYNEPHRINE) Adult infusion Stopped (09/02/19 0400)   . [START ON 09/05/2019] amiodarone  200 mg Oral BID   Followed by  . [START ON 09/12/2019] amiodarone  200 mg Oral Daily  . amiodarone  400 mg Oral BID  . atorvastatin  40 mg Oral q1800  . calcitRIOL  0.75 mcg Oral Q M,W,F-HD  . Chlorhexidine Gluconate Cloth  6 each Topical Q0600  . colchicine  0.3 mg Oral Q M,W,F-HD  . docusate sodium  100 mg Oral BID  . gabapentin  100 mg Oral QHS  . insulin aspart  0-9 Units Subcutaneous TID WC  . mouth rinse  15 mL Mouth Rinse  BID  . midodrine  10 mg Oral TID WC  . multivitamin  1 tablet Oral QHS  . polyethylene glycol  17 g Oral Daily  . sucroferric oxyhydroxide  1,000 mg Oral TID WC & HS

## 2019-09-03 NOTE — Progress Notes (Signed)
Progress Note  Patient Name: Brenda Arnold Date of Encounter: 09/03/2019  Primary Cardiologist: Fransico Him, MD   Subjective   Reports worsening pleuritic chest pain this morning.  Remains off pressors  Inpatient Medications    Scheduled Meds: . amiodarone  400 mg Oral BID  . atorvastatin  40 mg Oral q1800  . calcitRIOL  0.75 mcg Oral Q M,W,F-HD  . Chlorhexidine Gluconate Cloth  6 each Topical Q0600  . colchicine  0.3 mg Oral Q M,W,F-HD  . docusate sodium  100 mg Oral BID  . gabapentin  100 mg Oral QHS  . insulin aspart  0-9 Units Subcutaneous TID WC  . mouth rinse  15 mL Mouth Rinse BID  . midodrine  10 mg Oral TID WC  . multivitamin  1 tablet Oral QHS  . polyethylene glycol  17 g Oral Daily  . sucroferric oxyhydroxide  1,000 mg Oral TID WC & HS   Continuous Infusions: . sodium chloride    . sodium chloride    . sodium chloride 35 mL/hr at 09/03/19 0600  . amiodarone    . phenylephrine (NEO-SYNEPHRINE) Adult infusion Stopped (09/02/19 0400)   PRN Meds: sodium chloride, sodium chloride, acetaminophen **OR** acetaminophen, alteplase, heparin, HYDROmorphone (DILAUDID) injection, lidocaine (PF), lidocaine-prilocaine, menthol-cetylpyridinium, ondansetron (ZOFRAN) IV, pentafluoroprop-tetrafluoroeth, senna-docusate, traMADol   Vital Signs    Vitals:   09/03/19 0600 09/03/19 0800 09/03/19 0806 09/03/19 0817  BP: (!) 107/58 (!) 102/51 (!) 104/57 (!) 103/55  Pulse: 77   84  Resp: 17 19 19 20   Temp:      TempSrc:      SpO2: 100%   100%  Weight: 108 kg     Height:        Intake/Output Summary (Last 24 hours) at 09/03/2019 0832 Last data filed at 09/03/2019 0600 Gross per 24 hour  Intake 863.53 ml  Output 330 ml  Net 533.53 ml   Last 3 Weights 09/03/2019 09/01/2019 09/01/2019  Weight (lbs) 238 lb 1.6 oz 230 lb 6.1 oz 230 lb 13.2 oz  Weight (kg) 108 kg 104.5 kg 104.7 kg      Telemetry    NSR in 80s currently, no further AF. - Personally Reviewed  ECG     No new- Personally Reviewed  Physical Exam   GEN: alert and oriented.  No acute distress.   Neck: no JVD Cardiac: RRR, no murmurs Respiratory: CTAB GI: Soft, nontender, non-distended  MS: No edema; No deformity. Neuro:  Nonfocal  Psych: Normal affect   Labs    High Sensitivity Troponin:   Recent Labs  Lab 08/24/19 1747 08/24/19 2022 08/25/19 0931 08/25/19 1124  TROPONINIHS 7 8 12 10       Chemistry Recent Labs  Lab 08/30/19 0700 08/30/19 1250 08/31/19 1123 09/01/19 0357 09/02/19 0219  NA 138  --  138 137 136  K 5.2*  --  5.3* 5.1 4.1  CL 97*  --  97* 100 100  CO2 26  --  27 24 28   GLUCOSE 175*  --  118* 100* 115*  BUN 73*  --  37* 44* 18  CREATININE 14.27*  --  9.54* 10.38* 5.88*  CALCIUM 8.7*  --  8.7* 8.5* 8.0*  PROT  --  7.1  --   --   --   ALBUMIN 2.6* 3.1*  --   --  2.6*  AST  --  34  --   --   --   ALT  --  43  --   --   --  ALKPHOS  --  133*  --   --   --   BILITOT  --  0.4  --   --   --   GFRNONAA 2*  --  4* 4* 7*  GFRAA 3*  --  5* 4* 8*  ANIONGAP 15  --  14 13 8      Hematology Recent Labs  Lab 08/31/19 1532 09/01/19 0357 09/02/19 0219  WBC 8.0 10.4 11.9*  RBC 2.96* 2.96* 2.91*  HGB 9.6* 9.7* 9.2*  HCT 30.4* 30.2* 29.7*  MCV 102.7* 102.0* 102.1*  MCH 32.4 32.8 31.6  MCHC 31.6 32.1 31.0  RDW 15.9* 15.5 15.1  PLT 260 290 332    BNPNo results for input(s): BNP, PROBNP in the last 168 hours.   DDimer No results for input(s): DDIMER in the last 168 hours.   Radiology    Dg Chest Port 1 View  Result Date: 09/02/2019 CLINICAL DATA:  Right-sided chest tube placement EXAM: PORTABLE CHEST 1 VIEW COMPARISON:  09/01/2019 FINDINGS: Moderate left pleural effusion. Small right pleural effusion. Right basilar airspace disease likely reflecting atelectasis. No pneumothorax. Stable cardiomediastinal silhouette. Right jugular central venous catheter with the tip projecting over the SVC. Thoracic aortic atherosclerosis. No aggressive osseous lesion.  IMPRESSION: Small right pleural effusion. Right-sided chest tube in unchanged position without a pneumothorax. Moderate-sized left pleural effusion. Electronically Signed   By: Kathreen Devoid   On: 09/02/2019 08:34   Dg Chest Port 1 View  Result Date: 09/01/2019 CLINICAL DATA:  Pericardial effusion EXAM: PORTABLE CHEST 1 VIEW COMPARISON:  08/31/2019 FINDINGS: Large left pleural effusion is increased since prior study. Small layering right pleural effusion. Bilateral lower lobe airspace opacities, stable on the right and increasing on the left. Right central line is unchanged. IMPRESSION: Worsening left pleural effusion and left lower lobe airspace disease. Stable small right effusion and right lower lobe atelectasis or infiltrate. Electronically Signed   By: Rolm Baptise M.D.   On: 09/01/2019 09:26    Cardiac Studies   Limited echo 08/31/19:  1. Left ventricular ejection fraction, by visual estimation, is 55 to 60%. The left ventricle has normal function. There is no left ventricular hypertrophy.  2. Global right ventricle was not well visualized.The right ventricular size is not well visualized. Right vetricular wall thickness was not assessed.  3. Left atrial size was normal.  4. Right atrial size was not well visualized.  5. Large pericardial effusion.  6. The mitral valve was not assessed. Not assessed mitral valve regurgitation.  7. The tricuspid valve is not assessed. Tricuspid valve regurgitation was not assessed by color flow Doppler.  8. The aortic valve was not assessed Aortic valve regurgitation was not assessed by color flow Doppler.  9. The pulmonic valve was not well visualized. Pulmonic valve regurgitation was not assessed by color flow Doppler. 10. Aortic root could not be assessed. 11. Limited study for pericardial effusion; large pericardial effusion; no RV diastolic collapse; IVC dilated; cannot R/O early tamponade; echo is larger compared to 08/28/19. 12. The interatrial septum  was not assessed.  Limited Echo 08/28/19 1. Left ventricular ejection fraction, by visual estimation, is 60 to 65%. The left ventricle has normal function. Normal left ventricular size. There is no left ventricular hypertrophy. 2. Global right ventricle has normal systolic function.The right ventricular size is normal. No increase in right ventricular wall thickness. 3. Left atrial size was normal. 4. Right atrial size was normal. 5. Moderate pericardial effusion. 6. The pericardial effusion is circumferential.  7. The mitral valve is normal in structure. No evidence of mitral valve regurgitation. No evidence of mitral stenosis. 8. The tricuspid valve is normal in structure. Tricuspid valve regurgitation is trivial. 9. The aortic valve is normal in structure. Aortic valve regurgitation was not visualized by color flow Doppler. Structurally normal aortic valve, with no evidence of sclerosis or stenosis. 10. The pulmonic valve was normal in structure. Pulmonic valve regurgitation is not visualized by color flow Doppler. 11. Normal pulmonary artery systolic pressure. 12. The inferior vena cava is normal in size with greater than 50% respiratory variability, suggesting right atrial pressure of 3 mmHg. 13. Compared to 08/26/2019, the pericardial effusion appears larger, but there is no evidence of tamponade.  Echo 08/26/19 1. Left ventricular ejection fraction, by visual estimation, is 60 to 65%. The left ventricle has normal function. Normal left ventricular size. There is mildly increased left ventricular hypertrophy. 2. Left ventricular diastolic Doppler parameters are consistent with impaired relaxation pattern of LV diastolic filling. 3. The aortic valve is tricuspid Aortic valve regurgitation was not visualized by color flow Doppler. Mild aortic valve sclerosis without stenosis. 4. Left atrial size was normal. 5. Right atrial size was normal. 6. Global right ventricle has normal  systolic function.The right ventricular size is normal. No increase in right ventricular wall thickness. 7. Trivial pericardial effusion is present. 8. The tricuspid valve is normal in structure. Tricuspid valve regurgitation was not visualized by color flow Doppler. 9. The mitral valve is normal in structure. No evidence of mitral valve regurgitation. No evidence of mitral stenosis. 10. TR signal is inadequate for assessing pulmonary artery systolic pressure. 11. The inferior vena cava is normal in size with greater than 50% respiratory variability, suggesting right atrial pressure of 3 mmHg.  Patient Profile     63 y.o. female with a hx of DM2, HTN, ESRD on HD, paroxysmal Afib, OSA, obesity, chest pain s/p MV 2011, myoview 04/2017, w/o ischemia who is being seen for chest pain and abnormal EKG.   Assessment & Plan    Cardiac tamponade: initial presentation consistent with acute pericarditis (pleuritc chest pain, elevated inflammatory markers, diffuse ST elevation on EKG), treated with colchicine.  Initial TTE with trivial effusion.  On 9/29 developed hypotension and repeat TTE showed large effusion with findings concerning for tamponade.  S/p pericardial window with Dr Kipp Brood on 9/29.  Cultures NGTD, cytology with no malignant cells - Continue colchicine with HD - Holding Eliquis x 3 days (thru 10/2) per CT surgery - Continuing to have significant drainage through drain, leaving in today per CT surgery  Will repeat limited TTE to ensure complete drainage of effusion  Paroxysmal afib: on Eliquis.  Recurrent AF this admission, converted to NSR with amio.   - Eliquis on hold x 3 days following pericardial window as above - Continue amiodarone for rhythm control.  Amio 400 mg BID x 1 week (thru 10/3) and then 200 mg BID for 1 week then 200 mg daily  ESRD on HD: h/o of hypotension following HD, on midodrine  Hyperlipidemia: LDL 115, patient has refused statin  DM2: A1C 4.9.  Diet  controlled   For questions or updates, please contact [place] Please consult [email or web address] for contact info under      Signed, Donato Heinz, MD  09/03/2019, 8:32 AM

## 2019-09-03 NOTE — Plan of Care (Signed)
Pt is resting in bed with eyes closed, is alert and oriented when spoken to. Pt continues on 3L [place], breathing is even and unlabored, pt denies shortness of breath. Pt has been resting well throughout the night. Pt did complain of mild chest pain in the beginning but refused pain medication. Pt's breathing is even and unlabored, lung sounds are diminished. Pt has a very weak cough and breathing exercises have been encouraged. Pt has had about 200 output from pericardial tube. No complaints or concerns voiced at this time. Call bell is within reach, bed alarm is on, and be dis in lowest position. Problem: Cardiac: Goal: Ability to achieve and maintain adequate cardiovascular perfusion will improve Outcome: Progressing   Problem: Education: Goal: Knowledge of General Education information will improve Description: Including pain rating scale, medication(s)/side effects and non-pharmacologic comfort measures Outcome: Progressing   Problem: Clinical Measurements: Goal: Ability to maintain clinical measurements within normal limits will improve Outcome: Progressing Goal: Respiratory complications will improve Outcome: Progressing  Problem: Coping: Goal: Level of anxiety will decrease Outcome: Progressing   Problem: Pain Managment: Goal: General experience of comfort will improve Outcome: Progressing   Problem: Safety: Goal: Ability to remain free from injury will improve Outcome: Progressing

## 2019-09-03 NOTE — Progress Notes (Addendum)
[facility] DAILY ICU PROGRESS NOTE                   [address]            [place] [address]          [phone]   3 Days Post-Op Procedure(s) (LRB): VIDEO ASSISTED THORACOSCOPY/ DRAINAGE OF PERICARDIAL EFFUSION/ PERICARDIAL WINDOW/ ABORTED PERICARDIOCENTESIS  (Right)  Total Length of Stay:  LOS: 8 days   Subjective: Patient having pain on right side (incisional, chest tube)  Objective: Vital signs in last 24 hours: Temp:  [98.3 F (36.8 C)-99.5 F (37.5 C)] 99 F (37.2 C) (10/02 0400) Pulse Rate:  [72-159] 77 (10/02 0600) Cardiac Rhythm: Normal sinus rhythm (10/02 0400) Resp:  [11-27] 17 (10/02 0600) BP: (95-129)/(49-66) 107/58 (10/02 0600) SpO2:  [92 %-100 %] 100 % (10/02 0600) Arterial Line BP: (134)/(46) 134/46 (10/01 0800) Weight:  NG:[id number] kg] 108 kg (10/02 0600)  Filed Weights   09/01/19 1300 09/01/19 1700 09/03/19 0600  Weight: 104.7 kg 104.5 kg 108 kg    Weight change: 3.3 kg      Intake/Output from previous day: 10/01 0701 - 10/02 0700 In: 1103.5 [P.O.:300; I.V.:803.5] Out: 330 [Chest Tube:330]  Intake/Output this shift: No intake/output data recorded.  Current Meds: Scheduled Meds: . amiodarone  400 mg Oral BID  . atorvastatin  40 mg Oral q1800  . calcitRIOL  0.75 mcg Oral Q M,W,F-HD  . Chlorhexidine Gluconate Cloth  6 each Topical Q0600  . colchicine  0.3 mg Oral Q M,W,F-HD  . gabapentin  100 mg Oral QHS  . insulin aspart  0-9 Units Subcutaneous TID WC  . mouth rinse  15 mL Mouth Rinse BID  . midodrine  10 mg Oral TID WC  . multivitamin  1 tablet Oral QHS  . sucroferric oxyhydroxide  1,000 mg Oral TID WC & HS   Continuous Infusions: . sodium chloride    . sodium chloride    . sodium chloride 35 mL/hr at 09/03/19 0600  . amiodarone    . phenylephrine (NEO-SYNEPHRINE) Adult infusion Stopped (09/02/19 0400)   PRN Meds:.sodium chloride, sodium chloride, acetaminophen **OR** acetaminophen, alteplase, heparin, HYDROmorphone  (DILAUDID) injection, lidocaine (PF), lidocaine-prilocaine, menthol-cetylpyridinium, ondansetron (ZOFRAN) IV, pentafluoroprop-tetrafluoroeth, senna-docusate, traMADol  General appearance: alert, cooperative and no distress Heart: RRR Lungs: Slightly diminshed breath sounds right base Abdomen: Soft, obese, non tender, sporadic bowel sounds Extremities: SCDs in place Wound: Dressing is clean and dry. Wound under breast is clean and dry  Lab Results: CBC: Recent Labs    09/01/19 0357 09/02/19 0219  WBC 10.4 11.9*  HGB 9.7* 9.2*  HCT 30.2* 29.7*  PLT 290 332   BMET:  Recent Labs    09/01/19 0357 09/02/19 0219  NA 137 136  K 5.1 4.1  CL 100 100  CO2 24 28  GLUCOSE 100* 115*  BUN 44* 18  CREATININE 10.38* 5.88*  CALCIUM 8.5* 8.0*    CMET: Lab Results  Component Value Date   WBC 11.9 (H) 09/02/2019   HGB 9.2 (L) 09/02/2019   HCT 29.7 (L) 09/02/2019   PLT 332 09/02/2019   GLUCOSE 115 (H) 09/02/2019   CHOL 175 08/27/2019   TRIG 83 08/27/2019   HDL 43 08/27/2019   LDLCALC 115 (H) 08/27/2019   ALT 43 08/30/2019   AST 34 08/30/2019   NA 136 09/02/2019   K 4.1 09/02/2019   CL 100 09/02/2019   CREATININE 5.88 (H) 09/02/2019   BUN 18  09/02/2019   CO2 28 09/02/2019   TSH 1.026 08/30/2019   INR 1.97 11/09/2018   HGBA1C 4.9 09/01/2019    PT/INR: No results for input(s): LABPROT, INR in the last 72 hours. Radiology: No results found.   Assessment/Plan: S/P Procedure(s) (LRB): VIDEO ASSISTED THORACOSCOPY/ DRAINAGE OF PERICARDIAL EFFUSION/ PERICARDIAL WINDOW/ ABORTED PERICARDIOCENTESIS  (Right)   1. CV-PAF. SR with HR in the 80's this am. On Amiodarone 400 mg bid and Midodrine 10 mg tid. Will be able to restart Apixaban soon. 2. Pulmonary-on 2-3 liters via [place]. Chest tube with 330 cc last 24 hours. Chest tube to remain for now. Check CXR in am. 3. ESRD-HD M/W/Fri. Creatinine yesterday [id number]. 4. Metabolic bone disease-on Calcitriol and binders. 5. Anemia-H and H  yesterday 9.2 and 29.7  Donielle Liston Alba PA-C 09/03/2019 7:40 AM   Agree with above Continues to have high output from chest tube Will remove once < 266ml/day Continue pulm toilet for now  [employer]

## 2019-09-03 NOTE — Progress Notes (Signed)
[facility]  PROGRESS NOTE    Brenda Arnold  [id number] DOB: Mar 17, 1956 DOA: 08/25/2019 PCP: Kelton Pillar, MD   Brief Narrative:   Briefly, 63 year old female with ESRD, OSA, pAF who presented on 9/25 with hypotension in setting of hemodialysis and transferred to ICU on 9/29 for cardiogenic shock. PCCM consulted for cardiogenic shock secondary to tamponade.  10/2: C/o pain at and around CT insertion site; c/o constipation   Assessment & Plan:   Principal Problem:   Chest pain Active Problems:   Type 2 diabetes mellitus with complication, without long-term current use of insulin (HCC)   OBESITY   OBESITY-MORBID (>100')   OSA (obstructive sleep apnea)   Hypotension   ESRD on hemodialysis (HCC)   CAD (coronary artery disease)   Tachycardia   Pericarditis   Cardiac tamponade   Cardiac Tamponade     - presenting with atypical chest pain and hypotension     - found to be in cardiac tamponade w/ pericardial effusions     - now s/p pericardial window. CT management per CTS  Acute on chronic hypotension     - Off pressors.      - continue midodrine  Bilateral pleural effusions likely related to pulmonary edema in setting of volume overload     - Patient on 2L nasal cannula, stable     - CT in place  Pericarditis     - continue colchicine  PAF, AFRVR     - Amiodarone     - per cards: Continue amiodarone for rhythm control.  Amio 400 mg BID x 1 week (thru 10/3) and then 200 mg BID for 1 week then 200 mg daily     - Holding eliquis per CTS; will be able to resume soon  ESRD     - MWF dialysis per Nephrology  Constipation     - colace, miralax     - if no BM in next 36 hours; check KUB  DVT prophylaxis: SCDs Code Status: FULL Family Communication: None at bedside   Disposition Plan: TBD  Consultants:   [facility]  Cardiology  PCCM  Nephrology  ROS:  Reports right side chest/abdomen pain. Denies N, V. Remainder 10-pt ROS is negative for all not previously  mentioned.  Subjective: "It just hurts over there."  Objective: Vitals:   09/03/19 1000 09/03/19 1100 09/03/19 1200 09/03/19 1300  BP: (!) 97/51 (!) 106/59 (!) 101/54 (!) 103/51  Pulse: 81 78 74 74  Resp: (!) 21 (!) 30 18 (!) 23  Temp:  99.3 F (37.4 C)    TempSrc:      SpO2: 96% 100% 97% 98%  Weight:      Height:        Intake/Output Summary (Last 24 hours) at 09/03/2019 1343 Last data filed at 09/03/2019 1200 Gross per 24 hour  Intake 1343.71 ml  Output 510 ml  Net 833.71 ml   Filed Weights   09/01/19 1300 09/01/19 1700 09/03/19 0600  Weight: 104.7 kg 104.5 kg 108 kg    Examination:  General: 63 y.o. female resting in bed in NAD Eyes: PERRL, normal sclera ENMT: Nares patent w/o discharge, orophaynx clear, dentition normal, ears w/o discharge/lesions/ulcers Cardiovascular: RRR, +S1, S2, no m/g/r, equal pulses throughout Respiratory: CTABL, no w/r/r, normal WOB; R CT noted GI: BS+, NDNT, no masses noted, no organomegaly noted MSK: No c/c; BLE edema Skin: No rashes, bruises, ulcerations noted Neuro: A&O x 3, no focal deficits Psyc: Appropriate interaction and affect,  calm/cooperative   Data Reviewed: I have personally reviewed following labs and imaging studies.  CBC: Recent Labs  Lab 08/31/19 1123 08/31/19 1532 09/01/19 0357 09/02/19 0219 09/03/19 1047  WBC 9.5 8.0 10.4 11.9* 11.2*  NEUTROABS  --   --   --   --  8.7*  HGB 9.9* 9.6* 9.7* 9.2* 9.4*  HCT 31.0* 30.4* 30.2* 29.7* 29.6*  MCV 102.3* 102.7* 102.0* 102.1* 101.7*  PLT 313 260 290 332 [id number]   Basic Metabolic Panel: Recent Labs  Lab 08/30/19 0700 08/31/19 1123 09/01/19 0357 09/02/19 0219 09/03/19 1047  NA 138 138 137 136 136  K 5.2* 5.3* 5.1 4.1 4.1  CL 97* 97* 100 100 99  CO2 26 27 24 28 26   GLUCOSE 175* 118* 100* 115* 110*  BUN 73* 37* 44* 18 30*  CREATININE 14.27* 9.54* 10.38* 5.88* 8.74*  CALCIUM 8.7* 8.7* 8.5* 8.0* 8.1*  MG  --  2.1  --   --  1.9  PHOS 2.7 3.5  --  2.9 3.3   GFR:  Estimated Creatinine Clearance: 8.3 mL/min (A) (by C-G formula based on SCr of 8.74 mg/dL (H)). Liver Function Tests: Recent Labs  Lab 08/30/19 0700 08/30/19 1250 09/02/19 0219 09/03/19 1047  AST  --  34  --   --   ALT  --  43  --   --   ALKPHOS  --  133*  --   --   BILITOT  --  0.4  --   --   PROT  --  7.1  --   --   ALBUMIN 2.6* 3.1* 2.6* 2.4*   No results for input(s): LIPASE, AMYLASE in the last 168 hours. No results for input(s): AMMONIA in the last 168 hours. Coagulation Profile: No results for input(s): INR, PROTIME in the last 168 hours. Cardiac Enzymes: No results for input(s): CKTOTAL, CKMB, CKMBINDEX, TROPONINI in the last 168 hours. BNP (last 3 results) No results for input(s): PROBNP in the last 8760 hours. HbA1C: Recent Labs    09/01/19 0357  HGBA1C 4.9   CBG: Recent Labs  Lab 09/02/19 1640 09/02/19 2303 09/03/19 0619 09/03/19 0755 09/03/19 1215  GLUCAP 114* 127* 83 103* 138*   Lipid Profile: No results for input(s): CHOL, HDL, LDLCALC, TRIG, CHOLHDL, LDLDIRECT in the last 72 hours. Thyroid Function Tests: No results for input(s): TSH, T4TOTAL, FREET4, T3FREE, THYROIDAB in the last 72 hours. Anemia Panel: No results for input(s): VITAMINB12, FOLATE, FERRITIN, TIBC, IRON, RETICCTPCT in the last 72 hours. Sepsis Labs: No results for input(s): PROCALCITON, LATICACIDVEN in the last 168 hours.  Recent Results (from the past 240 hour(s))  SARS CORONAVIRUS 2 (TAT 6-24 HRS) Nasopharyngeal Nasopharyngeal Swab     Status: None   Collection Time: 08/24/19  5:47 PM   Specimen: Nasopharyngeal Swab  Result Value Ref Range Status   SARS Coronavirus 2 NEGATIVE NEGATIVE Final    Comment: (NOTE) SARS-CoV-2 target nucleic acids are NOT DETECTED. The SARS-CoV-2 RNA is generally detectable in upper and lower respiratory specimens during the acute phase of infection. Negative results do not preclude SARS-CoV-2 infection, do not rule out co-infections with other  pathogens, and should not be used as the sole basis for treatment or other patient management decisions. Negative results must be combined with clinical observations, patient history, and epidemiological information. The expected result is Negative. Fact Sheet for Patients: SugarRoll.be Fact Sheet for Healthcare Providers: https://www.woods-mathews.com/ This test is not yet approved or cleared by the [place] FDA and  has been authorized for detection and/or diagnosis of SARS-CoV-2 by FDA under an Emergency Use Authorization (EUA). This EUA will remain  in effect (meaning this test can be used) for the duration of the COVID-19 declaration under Section 56 4(b)(1) of the Act, 21 U.S.C. section 360bbb-3(b)(1), unless the authorization is terminated or revoked sooner. Performed at [facility], [place] [address], [place], [place] [address]   SARS CORONAVIRUS 2 (TAT 6-24 HRS) Nasopharyngeal Nasopharyngeal Swab     Status: None   Collection Time: 08/25/19 12:22 PM   Specimen: Nasopharyngeal Swab  Result Value Ref Range Status   SARS Coronavirus 2 NEGATIVE NEGATIVE Final    Comment: (NOTE) SARS-CoV-2 target nucleic acids are NOT DETECTED. The SARS-CoV-2 RNA is generally detectable in upper and lower respiratory specimens during the acute phase of infection. Negative results do not preclude SARS-CoV-2 infection, do not rule out co-infections with other pathogens, and should not be used as the sole basis for treatment or other patient management decisions. Negative results must be combined with clinical observations, patient history, and epidemiological information. The expected result is Negative. Fact Sheet for Patients: SugarRoll.be Fact Sheet for Healthcare Providers: https://www.woods-mathews.com/ This test is not yet approved or cleared by the [place] FDA and  has been authorized for  detection and/or diagnosis of SARS-CoV-2 by FDA under an Emergency Use Authorization (EUA). This EUA will remain  in effect (meaning this test can be used) for the duration of the COVID-19 declaration under Section 56 4(b)(1) of the Act, 21 U.S.C. section 360bbb-3(b)(1), unless the authorization is terminated or revoked sooner. Performed at [facility], [place] [address] [place], [place] [address]   Surgical PCR screen     Status: None   Collection Time: 08/31/19 11:26 AM   Specimen: Nasal Swab  Result Value Ref Range Status   MRSA, PCR NEGATIVE NEGATIVE Final   Staphylococcus aureus NEGATIVE NEGATIVE Final    Comment: (NOTE) The Xpert SA Assay (FDA approved for NASAL specimens in patients 28 years of age and older), is one component of a comprehensive surveillance program. It is not intended to diagnose infection nor to guide or monitor treatment. Performed at [facility], [place] [address] [place], [place] [address]   Aerobic/Anaerobic Culture (surgical/deep wound)     Status: None (Preliminary result)   Collection Time: 08/31/19  2:17 PM   Specimen: PATH Cytology Misc. fluid; Body Fluid  Result Value Ref Range Status   Specimen Description PERICARDIAL  Final   Special Requests NONE  Final   Gram Stain   Final    MODERATE WBC PRESENT,BOTH PMN AND MONONUCLEAR NO ORGANISMS SEEN    Culture   Final    NO GROWTH 3 DAYS NO GROWTH 5 DAYS Performed at [facility], [address], [place], [place] [address]    Report Status PENDING  Incomplete  Aerobic/Anaerobic Culture (surgical/deep wound)     Status: None (Preliminary result)   Collection Time: 08/31/19  2:29 PM   Specimen: Pericardial; Tissue  Result Value Ref Range Status   Specimen Description PERICARDIAL  Final   Special Requests NONE  Final   Gram Stain   Final    FEW WBC PRESENT, PREDOMINANTLY MONONUCLEAR NO ORGANISMS SEEN    Culture   Final    NO GROWTH 3 DAYS NO ANAEROBES ISOLATED; CULTURE IN  PROGRESS FOR 5 DAYS Performed at [facility], [place] [address] [place], [place] [address]    Report  Status PENDING  Incomplete      Radiology Studies: Dg Chest Port 1 View  Result Date: 09/02/2019 CLINICAL DATA:  Right-sided chest tube placement EXAM: PORTABLE CHEST 1 VIEW COMPARISON:  09/01/2019 FINDINGS: Moderate left pleural effusion. Small right pleural effusion. Right basilar airspace disease likely reflecting atelectasis. No pneumothorax. Stable cardiomediastinal silhouette. Right jugular central venous catheter with the tip projecting over the SVC. Thoracic aortic atherosclerosis. No aggressive osseous lesion. IMPRESSION: Small right pleural effusion. Right-sided chest tube in unchanged position without a pneumothorax. Moderate-sized left pleural effusion. Electronically Signed   By: Kathreen Devoid   On: 09/02/2019 08:34     Scheduled Meds: . Derrill Memo ON 09/05/2019] amiodarone  200 mg Oral BID   Followed by  . [START ON 09/12/2019] amiodarone  200 mg Oral Daily  . amiodarone  400 mg Oral BID  . atorvastatin  40 mg Oral q1800  . calcitRIOL  0.75 mcg Oral Q M,W,F-HD  . Chlorhexidine Gluconate Cloth  6 each Topical Q0600  . colchicine  0.3 mg Oral Q M,W,F-HD  . [START ON 09/10/2019] darbepoetin (ARANESP) injection - DIALYSIS  60 mcg Intravenous Q Fri-HD  . docusate sodium  100 mg Oral BID  . gabapentin  100 mg Oral QHS  . insulin aspart  0-9 Units Subcutaneous TID WC  . mouth rinse  15 mL Mouth Rinse BID  . midodrine  10 mg Oral TID WC  . multivitamin  1 tablet Oral QHS  . polyethylene glycol  17 g Oral Daily  . sucroferric oxyhydroxide  1,000 mg Oral TID WC & HS   Continuous Infusions: . sodium chloride    . sodium chloride    . sodium chloride 35 mL/hr at 09/03/19 1200  . phenylephrine (NEO-SYNEPHRINE) Adult infusion Stopped (09/02/19 0400)     LOS: 8 days    Time spent: 25 minutes spent in the coordination of care today.    Jonnie Finner, DO [facility]  Pager [phone]  If 7PM-7AM, please contact night-coverage [email or web address] Password [email or web address] 09/03/2019, 1:43 PM

## 2019-09-04 ENCOUNTER — Inpatient Hospital Stay (HOSPITAL_COMMUNITY): Payer: BC Managed Care – PPO

## 2019-09-04 LAB — TYPE AND SCREEN
ABO/RH(D): A POS
Antibody Screen: NEGATIVE
Unit division: 0
Unit division: 0

## 2019-09-04 LAB — RENAL FUNCTION PANEL
Albumin: 2.3 g/dL — ABNORMAL LOW (ref 3.5–5.0)
Anion gap: 10 (ref 5–15)
BUN: 19 mg/dL (ref 8–23)
CO2: 29 mmol/L (ref 22–32)
Calcium: 8.2 mg/dL — ABNORMAL LOW (ref 8.9–10.3)
Chloride: 98 mmol/L (ref 98–111)
Creatinine, Ser: 6.81 mg/dL — ABNORMAL HIGH (ref 0.44–1.00)
GFR calc Af Amer: 7 mL/min — ABNORMAL LOW (ref >60)
GFR calc non Af Amer: 6 mL/min — ABNORMAL LOW (ref >60)
Glucose, Bld: 90 mg/dL (ref 70–99)
Phosphorus: 2.4 mg/dL — ABNORMAL LOW (ref 2.5–4.6)
Potassium: 4 mmol/L (ref 3.5–5.1)
Sodium: 137 mmol/L (ref 135–145)

## 2019-09-04 LAB — CBC WITH DIFFERENTIAL/PLATELET
Abs Immature Granulocytes: 0.15 10*3/uL — ABNORMAL HIGH (ref 0.00–0.07)
Basophils Absolute: 0 10*3/uL (ref 0.0–0.1)
Basophils Relative: 0 %
Eosinophils Absolute: 0.2 10*3/uL (ref 0.0–0.5)
Eosinophils Relative: 1 %
HCT: 30.4 % — ABNORMAL LOW (ref 36.0–46.0)
Hemoglobin: 9.7 g/dL — ABNORMAL LOW (ref 12.0–15.0)
Immature Granulocytes: 1 %
Lymphocytes Relative: 10 %
Lymphs Abs: 1.2 10*3/uL (ref 0.7–4.0)
MCH: 32.2 pg (ref 26.0–34.0)
MCHC: 31.9 g/dL (ref 30.0–36.0)
MCV: 101 fL — ABNORMAL HIGH (ref 80.0–100.0)
Monocytes Absolute: 1.8 10*3/uL — ABNORMAL HIGH (ref 0.1–1.0)
Monocytes Relative: 14 %
Neutro Abs: 9.4 10*3/uL — ABNORMAL HIGH (ref 1.7–7.7)
Neutrophils Relative %: 74 %
Platelets: 350 10*3/uL (ref 150–400)
RBC: 3.01 MIL/uL — ABNORMAL LOW (ref 3.87–5.11)
RDW: 15 % (ref 11.5–15.5)
WBC: 12.7 10*3/uL — ABNORMAL HIGH (ref 4.0–10.5)
nRBC: 0 % (ref 0.0–0.2)

## 2019-09-04 LAB — BPAM RBC
Blood Product Expiration Date: 202010212359
Blood Product Expiration Date: 202010212359
ISSUE DATE / TIME: 202009291408
ISSUE DATE / TIME: 202009291408
Unit Type and Rh: 6200
Unit Type and Rh: 6200

## 2019-09-04 LAB — GLUCOSE, CAPILLARY
Glucose-Capillary: 125 mg/dL — ABNORMAL HIGH (ref 70–99)
Glucose-Capillary: 133 mg/dL — ABNORMAL HIGH (ref 70–99)
Glucose-Capillary: 163 mg/dL — ABNORMAL HIGH (ref 70–99)
Glucose-Capillary: 84 mg/dL (ref 70–99)
Glucose-Capillary: 94 mg/dL (ref 70–99)

## 2019-09-04 LAB — MAGNESIUM: Magnesium: 1.9 mg/dL (ref 1.7–2.4)

## 2019-09-04 MED ORDER — DARBEPOETIN ALFA 60 MCG/0.3ML IJ SOSY: 60.0000 ug | PREFILLED_SYRINGE | INTRAMUSCULAR | Status: DC

## 2019-09-04 MED ORDER — AMIODARONE HCL IN DEXTROSE 360-4.14 MG/200ML-% IV SOLN
INTRAVENOUS | Status: AC
  Filled 2019-09-04: qty 200

## 2019-09-04 MED ORDER — AMIODARONE HCL IN DEXTROSE 360-4.14 MG/200ML-% IV SOLN: 60.0000 mg/h | INTRAVENOUS | Status: AC

## 2019-09-04 MED ORDER — DARBEPOETIN ALFA 60 MCG/0.3ML IJ SOSY
60.0000 ug | PREFILLED_SYRINGE | Freq: Once | INTRAMUSCULAR | Status: AC
  Administered 2019-09-04: 60 ug via SUBCUTANEOUS
  Filled 2019-09-04: qty 0.3

## 2019-09-04 MED ORDER — AMIODARONE LOAD VIA INFUSION
150.0000 mg | Freq: Once | INTRAVENOUS | Status: AC
  Administered 2019-09-04: 150 mg via INTRAVENOUS
  Filled 2019-09-04: qty 83.34

## 2019-09-04 MED ORDER — AMIODARONE HCL IN DEXTROSE 360-4.14 MG/200ML-% IV SOLN
30.0000 mg/h | INTRAVENOUS | Status: DC
  Filled 2019-09-04 (×4): qty 200

## 2019-09-04 MED ORDER — APIXABAN 5 MG PO TABS
5.0000 mg | ORAL_TABLET | Freq: Two times a day (BID) | ORAL | Status: DC
  Administered 2019-09-04 – 2019-09-09 (×10): 5 mg via ORAL
  Filled 2019-09-04 (×10): qty 1

## 2019-09-04 NOTE — Progress Notes (Signed)
[facility]  PROGRESS NOTE    TRISTINE FUJITANI  [id number] DOB: June 01, 1956 DOA: 08/25/2019 PCP: Kelton Pillar, MD   Brief Narrative:   63 year old female with ESRD, OSA, pAF who presented on 9/25 with hypotension in setting of hemodialysis and transferred to ICU on 9/29 for cardiogenic shock. PCCM consulted for cardiogenic shock secondary to tamponade.  10/2: C/o pain at and around CT insertion site; c/o constipation 10/3: Says she did better ON. Had BM. Generally in better spirit today.   Assessment & Plan:   Principal Problem:   Chest pain Active Problems:   Type 2 diabetes mellitus with complication, without long-term current use of insulin (HCC)   OBESITY   OBESITY-MORBID (>100')   OSA (obstructive sleep apnea)   Hypotension   ESRD on hemodialysis (HCC)   CAD (coronary artery disease)   Tachycardia   Pericarditis   Cardiac tamponade   Cardiac Tamponade     - presenting with atypical chest pain and hypotension     - found to be in cardiac tamponade w/ pericardial effusions     - now s/p pericardial window. CT management per CTS; plan to remove tube today  Acute on chronic hypotension     - Off pressors.      - continue midodrine  Bilateral pleural effusions likely related to pulmonary edema in setting of volume overload     - Patient on 2L nasal cannula, stable     - CT in place  Pericarditis     - continue colchicine  PAF, AFRVR     - Amiodarone     - per cards: Continue amiodarone for rhythm control.  Amio 400 mg BID x 1 week (thru 10/3) and then 200 mg BID for 1 week then 200 mg daily     - Holding eliquis per CTS; will be able to resume soon     - 10/3: resuming eliquis today  ESRD     - MWF dialysis per Nephrology  Constipation     - colace, miralax     - if no BM in next 36 hours; check KUB     - BM last night; can get today's dose of miralax and d/c     - continue colace  DVT prophylaxis: eliquis Code Status: FULL   Disposition Plan:  TBD  Consultants:   [facility]  Neprhology   ROS:  Denies palpitations, N, V, chest pain. Remainder 10-pt ROS is negative for all not previously mentioned.  Subjective: "I had a good movement."  Objective: Vitals:   09/04/19 0802 09/04/19 0900 09/04/19 1000 09/04/19 1209  BP:  (!) 103/55 102/63   Pulse:  83 83   Resp:  (!) 22 20   Temp: 99.8 F (37.7 C)   98.8 F (37.1 C)  TempSrc: Oral   Oral  SpO2:  97% 96%   Weight:      Height:        Intake/Output Summary (Last 24 hours) at 09/04/2019 1410 Last data filed at 09/04/2019 0700 Gross per 24 hour  Intake 482.86 ml  Output 2176 ml  Net -1693.14 ml   Filed Weights   09/03/19 1435 09/03/19 1842 09/04/19 0530  Weight: 106.4 kg 104 kg 105 kg    Examination:  General: 63 y.o. female resting in bed in NAD Eyes: PERRL, normal sclera ENMT: Nares patent w/o discharge, orophaynx clear, dentition normal, ears w/o discharge/lesions/ulcers Cardiovascular: RRR, +S1, S2, no m/g/r, equal pulses throughout Respiratory: CTABL,  no w/r/r, normal WOB; R CT noted GI: BS+, NDNT, no masses noted, no organomegaly noted MSK: No c/c; BLE edema Skin: No rashes, bruises, ulcerations noted Neuro: Alert to name, follows commands Psyc: Appropriate interaction and affect, calm/cooperative   Data Reviewed: I have personally reviewed following labs and imaging studies.  CBC: Recent Labs  Lab 08/31/19 1532 09/01/19 0357 09/02/19 0219 09/03/19 1047 09/04/19 0422  WBC 8.0 10.4 11.9* 11.2* 12.7*  NEUTROABS  --   --   --  8.7* 9.4*  HGB 9.6* 9.7* 9.2* 9.4* 9.7*  HCT 30.4* 30.2* 29.7* 29.6* 30.4*  MCV 102.7* 102.0* 102.1* 101.7* 101.0*  PLT 260 290 332 356 [id number]   Basic Metabolic Panel: Recent Labs  Lab 08/30/19 0700 08/31/19 1123 09/01/19 0357 09/02/19 0219 09/03/19 1047 09/04/19 0422  NA 138 138 137 136 136 137  K 5.2* 5.3* 5.1 4.1 4.1 4.0  CL 97* 97* 100 100 99 98  CO2 26 27 24 28 26 29   GLUCOSE 175* 118* 100* 115*  110* 90  BUN 73* 37* 44* 18 30* 19  CREATININE 14.27* 9.54* 10.38* 5.88* 8.74* 6.81*  CALCIUM 8.7* 8.7* 8.5* 8.0* 8.1* 8.2*  MG  --  2.1  --   --  1.9 1.9  PHOS 2.7 3.5  --  2.9 3.3 2.4*   GFR: Estimated Creatinine Clearance: 10.5 mL/min (A) (by C-G formula based on SCr of 6.81 mg/dL (H)). Liver Function Tests: Recent Labs  Lab 08/30/19 0700 08/30/19 1250 09/02/19 0219 09/03/19 1047 09/04/19 0422  AST  --  34  --   --   --   ALT  --  43  --   --   --   ALKPHOS  --  133*  --   --   --   BILITOT  --  0.4  --   --   --   PROT  --  7.1  --   --   --   ALBUMIN 2.6* 3.1* 2.6* 2.4* 2.3*   No results for input(s): LIPASE, AMYLASE in the last 168 hours. No results for input(s): AMMONIA in the last 168 hours. Coagulation Profile: No results for input(s): INR, PROTIME in the last 168 hours. Cardiac Enzymes: No results for input(s): CKTOTAL, CKMB, CKMBINDEX, TROPONINI in the last 168 hours. BNP (last 3 results) No results for input(s): PROBNP in the last 8760 hours. HbA1C: No results for input(s): HGBA1C in the last 72 hours. CBG: Recent Labs  Lab 09/03/19 1215 09/03/19 2103 09/04/19 0626 09/04/19 1011 09/04/19 1212  GLUCAP 138* 133* 125* 133* 163*   Lipid Profile: No results for input(s): CHOL, HDL, LDLCALC, TRIG, CHOLHDL, LDLDIRECT in the last 72 hours. Thyroid Function Tests: No results for input(s): TSH, T4TOTAL, FREET4, T3FREE, THYROIDAB in the last 72 hours. Anemia Panel: Recent Labs    09/03/19 2106  TIBC 151*  IRON 30   Sepsis Labs: No results for input(s): PROCALCITON, LATICACIDVEN in the last 168 hours.  Recent Results (from the past 240 hour(s))  Surgical PCR screen     Status: None   Collection Time: 08/31/19 11:26 AM   Specimen: Nasal Swab  Result Value Ref Range Status   MRSA, PCR NEGATIVE NEGATIVE Final   Staphylococcus aureus NEGATIVE NEGATIVE Final    Comment: (NOTE) The Xpert SA Assay (FDA approved for NASAL specimens in patients 38 years of  age and older), is one component of a comprehensive surveillance program. It is not intended to diagnose infection nor to guide or  monitor treatment. Performed at [facility], [place] [address], [place], [place] [address]   Aerobic/Anaerobic Culture (surgical/deep wound)     Status: None (Preliminary result)   Collection Time: 08/31/19  2:17 PM   Specimen: PATH Cytology Misc. fluid; Body Fluid  Result Value Ref Range Status   Specimen Description PERICARDIAL  Final   Special Requests NONE  Final   Gram Stain   Final    MODERATE WBC PRESENT,BOTH PMN AND MONONUCLEAR NO ORGANISMS SEEN    Culture   Final    NO GROWTH 3 DAYS NO GROWTH 5 DAYS Performed at [facility], [address], [place], [place] [address]    Report Status PENDING  Incomplete  Aerobic/Anaerobic Culture (surgical/deep wound)     Status: None (Preliminary result)   Collection Time: 08/31/19  2:29 PM   Specimen: Pericardial; Tissue  Result Value Ref Range Status   Specimen Description PERICARDIAL  Final   Special Requests NONE  Final   Gram Stain   Final    FEW WBC PRESENT, PREDOMINANTLY MONONUCLEAR NO ORGANISMS SEEN    Culture   Final    NO GROWTH 3 DAYS NO ANAEROBES ISOLATED; CULTURE IN PROGRESS FOR 5 DAYS Performed at [facility], [place] [address], [place], [place] [address]    Report Status PENDING  Incomplete      Radiology Studies: Dg Chest Port 1 View  Result Date: 09/04/2019 CLINICAL DATA:  Pneumothorax. EXAM: PORTABLE CHEST 1 VIEW COMPARISON:  09/02/2019 FINDINGS: The right-sided central venous catheter is well position. There is no pneumothorax. Again noted is a moderate to large left-sided pleural effusion with adjacent airspace disease which may represent compressive atelectasis. There is a right-sided chest tube in place. There is a small to moderate size right-sided pleural effusion prominent interstitial lung markings are noted. The heart size remains unchanged. Aortic  calcifications are noted. IMPRESSION: 1. Lines and tubes as above. 2. Moderate to large left-sided pleural effusion with adjacent presumed compressive atelectasis. 3. Small to moderate right-sided pleural effusion with adjacent atelectasis. Electronically Signed   By: Constance Holster M.D.   On: 09/04/2019 09:34     Scheduled Meds:  [START ON 09/05/2019] amiodarone  200 mg Oral BID   Followed by   Derrill Memo ON 09/12/2019] amiodarone  200 mg Oral Daily   amiodarone  400 mg Oral BID   apixaban  5 mg Oral BID   atorvastatin  40 mg Oral q1800   calcitRIOL  0.75 mcg Oral Q M,W,F-HD   Chlorhexidine Gluconate Cloth  6 each Topical Q0600   colchicine  0.3 mg Oral Q M,W,F-HD   darbepoetin (ARANESP) injection - NON-DIALYSIS  60 mcg Subcutaneous Once   [START ON 09/10/2019] darbepoetin (ARANESP) injection - NON-DIALYSIS  60 mcg Subcutaneous Q Fri-1800   docusate sodium  100 mg Oral BID   gabapentin  100 mg Oral QHS   insulin aspart  0-9 Units Subcutaneous TID WC   mouth rinse  15 mL Mouth Rinse BID   midodrine  10 mg Oral TID WC   multivitamin  1 tablet Oral QHS   polyethylene glycol  17 g Oral Daily   sucroferric oxyhydroxide  1,000 mg Oral TID WC & HS   Continuous Infusions:  sodium chloride 35 mL/hr at 09/04/19 0700   amiodarone     amiodarone     phenylephrine (NEO-SYNEPHRINE) Adult infusion 60 mcg/min (09/04/19 1232)     LOS: 9 days    Time spent: 25  minutes spent in the coordination of care today.    Jonnie Finner, DO [facility] Pager [phone]  If 7PM-7AM, please contact night-coverage [email or web address] Password [email or web address] 09/04/2019, 2:10 PM

## 2019-09-04 NOTE — Progress Notes (Signed)
[facility] Progress Note   Subjective:  Stable , up in chair today, no new c/o  Objective Vitals:   09/04/19 0802 09/04/19 0900 09/04/19 1000 09/04/19 1209  BP:  (!) 103/55 102/63   Pulse:  83 83   Resp:  (!) 22 20   Temp: 99.8 F (37.7 C)   98.8 F (37.1 C)  TempSrc: Oral   Oral  SpO2:  97% 96%   Weight:      Height:       Physical Exam General: Well developed, well nourished female, fatigued, obese Heart: RRR, no murmurs, rubs or gallops Lungs: CTA bilaterally without wheezing, rhonchi or rales Abdomen: Soft, non-tender, non-distended. +BS Extremities: No LE edema Dialysis Access:  L forearm AVF with two aneurysms+Bruit  Dialysis: [facility] MWF   4h 200Re   450/800   99kg   2/2 bath  Hep 10000  L AVF Parsabiv 15 Calcitriol 0.75 Recent OP Labs: Hgb 12.2 K 4.7 Ca 8.3 Phos 3.9 PTH 1960  Assessment/Plan: 1. Pericardial effusion/ tamponade/ acute pericarditis - EKG with diffuse ST elevation. Echo showed LVEF of 60-65%, normal RV function.  CP improved but hypotension worsened and pt taken for pericardial window on 08/31/19. BP's improved w/ drainage of effusion. Per Card surg and cardiology.  2. Hypertension/volume:chronic BP issue on midodrine. Up 5-6 kg by wt's, mild edema on exam. Rewrote for fluid restriction.  3. ESRD:HD MWF. Next HD Monday. NO HEPARIN due to tamponade 4. L AVF aneurysms: evaluated by Dr. Donnetta Hutching, reports it is safe to use access and to avoid areas of superficial blistering 5. Atrial fib: Converted to NSR. Transitioning to po amio. Eliquis on hold for 3 days related to #1.  6. Anemia ckd + abl: Hb low 9's, will start on darbe 60 ug 1st dose today 10/3, then weekly on Friday.  7. Metabolic bone disease: Continue bindersand calcitriol. On Parasbivfor 2HPTH- not availablein hospital. Resume at discharge.    Kelly Splinter  MD 09/01/2019, 12:06 PM  Additional Objective Labs: Basic Metabolic Panel: Recent Labs  Lab 09/02/19 0219  09/03/19 1047 09/04/19 0422  NA 136 136 137  K 4.1 4.1 4.0  CL 100 99 98  CO2 28 26 29   GLUCOSE 115* 110* 90  BUN 18 30* 19  CREATININE 5.88* 8.74* 6.81*  CALCIUM 8.0* 8.1* 8.2*  PHOS 2.9 3.3 2.4*   Liver Function Tests: Recent Labs  Lab 08/30/19 1250 09/02/19 0219 09/03/19 1047 09/04/19 0422  AST 34  --   --   --   ALT 43  --   --   --   ALKPHOS 133*  --   --   --   BILITOT 0.4  --   --   --   PROT 7.1  --   --   --   ALBUMIN 3.1* 2.6* 2.4* 2.3*   CBC: Recent Labs  Lab 08/31/19 1532 09/01/19 0357 09/02/19 0219 09/03/19 1047 09/04/19 0422  WBC 8.0 10.4 11.9* 11.2* 12.7*  NEUTROABS  --   --   --  8.7* 9.4*  HGB 9.6* 9.7* 9.2* 9.4* 9.7*  HCT 30.4* 30.2* 29.7* 29.6* 30.4*  MCV 102.7* 102.0* 102.1* 101.7* 101.0*  PLT 260 290 332 356 350   Blood Culture    Component Value Date/Time   SDES PERICARDIAL 08/31/2019 1429   SPECREQUEST NONE 08/31/2019 1429   CULT  08/31/2019 1429    NO GROWTH 3 DAYS NO ANAEROBES ISOLATED; CULTURE IN PROGRESS FOR 5 DAYS Performed at  [facility], [place] [address], [place], [place] [address]    REPTSTATUS PENDING 08/31/2019 1429    CBG: Recent Labs  Lab 09/03/19 1215 09/03/19 2103 09/04/19 0626 09/04/19 1011 09/04/19 1212  GLUCAP 138* 133* 125* 133* 163*   Medications: . sodium chloride 35 mL/hr at 09/04/19 0700  . amiodarone    . amiodarone    . phenylephrine (NEO-SYNEPHRINE) Adult infusion 60 mcg/min (09/04/19 1232)   . [START ON 09/05/2019] amiodarone  200 mg Oral BID   Followed by  . [START ON 09/12/2019] amiodarone  200 mg Oral Daily  . amiodarone  400 mg Oral BID  . atorvastatin  40 mg Oral q1800  . calcitRIOL  0.75 mcg Oral Q M,W,F-HD  . Chlorhexidine Gluconate Cloth  6 each Topical Q0600  . colchicine  0.3 mg Oral Q M,W,F-HD  . [START ON 09/10/2019] darbepoetin (ARANESP) injection - DIALYSIS  60 mcg Intravenous Q Fri-HD  . docusate sodium  100 mg Oral BID  . gabapentin  100 mg Oral QHS  . insulin aspart   0-9 Units Subcutaneous TID WC  . mouth rinse  15 mL Mouth Rinse BID  . midodrine  10 mg Oral TID WC  . multivitamin  1 tablet Oral QHS  . polyethylene glycol  17 g Oral Daily  . sucroferric oxyhydroxide  1,000 mg Oral TID WC & HS

## 2019-09-04 NOTE — Progress Notes (Signed)
  Amiodarone Drug - Drug Interaction Consult Note  Recommendations: No medication adjustments at this time. Monitor vitals and for future interactions.   Amiodarone is metabolized by the cytochrome P450 system and therefore has the potential to cause many drug interactions. Amiodarone has an average plasma half-life of 50 days (range 20 to 100 days).   There is potential for drug interactions to occur several weeks or months after stopping treatment and the onset of drug interactions may be slow after initiating amiodarone.   [x]  Statins: Increased risk of myopathy. Simvastatin- restrict dose to 20mg  daily. Other statins: counsel patients to report any muscle pain or weakness immediately.  []  Anticoagulants: Amiodarone can increase anticoagulant effect. Consider warfarin dose reduction. Patients should be monitored closely and the dose of anticoagulant altered accordingly, remembering that amiodarone levels take several weeks to stabilize.  []  Antiepileptics: Amiodarone can increase plasma concentration of phenytoin, the dose should be reduced. Note that small changes in phenytoin dose can result in large changes in levels. Monitor patient and counsel on signs of toxicity.  []  Beta blockers: increased risk of bradycardia, AV block and myocardial depression. Sotalol - avoid concomitant use.  []   Calcium channel blockers (diltiazem and verapamil): increased risk of bradycardia, AV block and myocardial depression.  []   Cyclosporine: Amiodarone increases levels of cyclosporine. Reduced dose of cyclosporine is recommended.  []  Digoxin dose should be halved when amiodarone is started.  []  Diuretics: increased risk of cardiotoxicity if hypokalemia occurs.  []  Oral hypoglycemic agents (glyburide, glipizide, glimepiride): increased risk of hypoglycemia. Patient's glucose levels should be monitored closely when initiating amiodarone therapy.   []  Drugs that prolong the QT interval:  Torsades de  pointes risk may be increased with concurrent use - avoid if possible.  Monitor QTc, also keep magnesium/potassium WNL if concurrent therapy can't be avoided. [facility] Kitchen Antibiotics: e.g. fluoroquinolones, erythromycin. . Antiarrhythmics: e.g. quinidine, procainamide, disopyramide, sotalol. . Antipsychotics: e.g. phenothiazines, haloperidol.  . Lithium, tricyclic antidepressants, and methadone.  Thank You,  Antonietta Jewel, PharmD, BCCCP Clinical Pharmacist   Please check AMION for all [place] phone numbers After 10:00 PM, call [place] [phone] 09/04/2019 6:24 AM

## 2019-09-04 NOTE — Progress Notes (Signed)
Progress Note  Patient Name: Brenda Arnold Date of Encounter: 09/04/2019  Primary Cardiologist: Fransico Him, MD   Subjective   Feeling OK.  Still has mild pleuritic CP that is improving.   Inpatient Medications    Scheduled Meds: . [START ON 09/05/2019] amiodarone  200 mg Oral BID   Followed by  . [START ON 09/12/2019] amiodarone  200 mg Oral Daily  . amiodarone  400 mg Oral BID  . atorvastatin  40 mg Oral q1800  . calcitRIOL  0.75 mcg Oral Q M,W,F-HD  . Chlorhexidine Gluconate Cloth  6 each Topical Q0600  . colchicine  0.3 mg Oral Q M,W,F-HD  . [START ON 09/10/2019] darbepoetin (ARANESP) injection - DIALYSIS  60 mcg Intravenous Q Fri-HD  . docusate sodium  100 mg Oral BID  . gabapentin  100 mg Oral QHS  . insulin aspart  0-9 Units Subcutaneous TID WC  . mouth rinse  15 mL Mouth Rinse BID  . midodrine  10 mg Oral TID WC  . multivitamin  1 tablet Oral QHS  . polyethylene glycol  17 g Oral Daily  . sucroferric oxyhydroxide  1,000 mg Oral TID WC & HS   Continuous Infusions: . sodium chloride 35 mL/hr at 09/04/19 0700  . amiodarone Stopped (09/04/19 0626)   Followed by  . amiodarone    . amiodarone    . phenylephrine (NEO-SYNEPHRINE) Adult infusion 10 mcg/min (09/04/19 0210)   PRN Meds: acetaminophen **OR** acetaminophen, HYDROmorphone (DILAUDID) injection, menthol-cetylpyridinium, ondansetron (ZOFRAN) IV, senna-docusate, traMADol   Vital Signs    Vitals:   09/04/19 0700 09/04/19 0715 09/04/19 0730 09/04/19 0802  BP: (!) 96/57  (!) 83/51   Pulse: 84 88 88   Resp: 19 17 (!) 22   Temp:    99.8 F (37.7 C)  TempSrc:    Oral  SpO2: 98% 99% 97%   Weight:      Height:        Intake/Output Summary (Last 24 hours) at 09/04/2019 1014 Last data filed at 09/04/2019 0700 Gross per 24 hour  Intake 827.9 ml  Output 2276 ml  Net -1448.1 ml   Last 3 Weights 09/04/2019 09/03/2019 09/03/2019  Weight (lbs) 231 lb 7.7 oz 229 lb 4.5 oz 234 lb 9.1 oz  Weight (kg) 105 kg 104  kg 106.4 kg      Telemetry    Afib with RVR this AM.  Now sinus rhythm.  - Personally Reviewed  ECG     09/04/19: Atrial fibrillation.  Rate 138 bpm.  LAD. - Personally Reviewed  Physical Exam   VS:  BP 102/63   Pulse 83   Temp 99.8 F (37.7 C) (Oral)   Resp 20   Ht 5\' 6"  (1.676 m)   Wt 105 kg   SpO2 96%   BMI 37.36 kg/m  , BMI Body mass index is 37.36 kg/m. GENERAL:  Chronically ill-appearing.  No acute distress. HEENT: Pupils equal round and reactive, fundi not visualized, oral mucosa unremarkable NECK:  No jugular venous distention, waveform within normal limits, carotid upstroke brisk and symmetric, no bruits LUNGS:  Clear to auscultation bilaterally HEART:  RRR.  PMI not displaced or sustained,S1 and S2 within normal limits, no S3, no S4, no clicks, no rubs, no murmurs ABD:  Flat, positive bowel sounds normal in frequency in pitch, no bruits, no rebound, no guarding, no midline pulsatile mass, no hepatomegaly, no splenomegaly EXT:  2 plus pulses throughout, no edema, no cyanosis no clubbing SKIN:  No rashes no nodules NEURO:  Cranial nerves II through XII grossly intact, motor grossly intact throughout [facility]:  Cognitively intact, oriented to person place and time   Labs    High Sensitivity Troponin:   Recent Labs  Lab 08/24/19 1747 08/24/19 2022 08/25/19 0931 08/25/19 1124  TROPONINIHS 7 8 12 10       Chemistry Recent Labs  Lab 08/30/19 1250  09/02/19 0219 09/03/19 1047 09/04/19 0422  NA  --    < > 136 136 137  K  --    < > 4.1 4.1 4.0  CL  --    < > 100 99 98  CO2  --    < > 28 26 29   GLUCOSE  --    < > 115* 110* 90  BUN  --    < > 18 30* 19  CREATININE  --    < > 5.88* 8.74* 6.81*  CALCIUM  --    < > 8.0* 8.1* 8.2*  PROT 7.1  --   --   --   --   ALBUMIN 3.1*  --  2.6* 2.4* 2.3*  AST 34  --   --   --   --   ALT 43  --   --   --   --   ALKPHOS 133*  --   --   --   --   BILITOT 0.4  --   --   --   --   GFRNONAA  --    < > 7* 4* 6*  GFRAA  --     < > 8* 5* 7*  ANIONGAP  --    < > 8 11 10    < > = values in this interval not displayed.     Hematology Recent Labs  Lab 09/02/19 0219 09/03/19 1047 09/04/19 0422  WBC 11.9* 11.2* 12.7*  RBC 2.91* 2.91* 3.01*  HGB 9.2* 9.4* 9.7*  HCT 29.7* 29.6* 30.4*  MCV 102.1* 101.7* 101.0*  MCH 31.6 32.3 32.2  MCHC 31.0 31.8 31.9  RDW 15.1 15.2 15.0  PLT 332 356 350    BNPNo results for input(s): BNP, PROBNP in the last 168 hours.   DDimer No results for input(s): DDIMER in the last 168 hours.   Radiology    Dg Chest Port 1 View  Result Date: 09/04/2019 CLINICAL DATA:  Pneumothorax. EXAM: PORTABLE CHEST 1 VIEW COMPARISON:  09/02/2019 FINDINGS: The right-sided central venous catheter is well position. There is no pneumothorax. Again noted is a moderate to large left-sided pleural effusion with adjacent airspace disease which may represent compressive atelectasis. There is a right-sided chest tube in place. There is a small to moderate size right-sided pleural effusion prominent interstitial lung markings are noted. The heart size remains unchanged. Aortic calcifications are noted. IMPRESSION: 1. Lines and tubes as above. 2. Moderate to large left-sided pleural effusion with adjacent presumed compressive atelectasis. 3. Small to moderate right-sided pleural effusion with adjacent atelectasis. Electronically Signed   By: Constance Holster M.D.   On: 09/04/2019 09:34    Cardiac Studies   Limited echo 08/31/19: 1. Left ventricular ejection fraction, by visual estimation, is 55 to 60%. The left ventricle has normal function. There is no left ventricular hypertrophy. 2. Global right ventricle was not well visualized.The right ventricular size is not well visualized. Right vetricular wall thickness was not assessed. 3. Left atrial size was normal. 4. Right atrial size was not well visualized. 5. Large pericardial effusion. 6.  The mitral valve was not assessed. Not assessed mitral valve  regurgitation. 7. The tricuspid valve is not assessed. Tricuspid valve regurgitation was not assessed by color flow Doppler. 8. The aortic valve was not assessed Aortic valve regurgitation was not assessed by color flow Doppler. 9. The pulmonic valve was not well visualized. Pulmonic valve regurgitation was not assessed by color flow Doppler. 10. Aortic root could not be assessed. 11. Limited study for pericardial effusion; large pericardial effusion; no RV diastolic collapse; IVC dilated; cannot R/O early tamponade; echo is larger compared to 08/28/19. 12. The interatrial septum was not assessed.  Limited Echo 08/28/19 1. Left ventricular ejection fraction, by visual estimation, is 60 to 65%. The left ventricle has normal function. Normal left ventricular size. There is no left ventricular hypertrophy. 2. Global right ventricle has normal systolic function.The right ventricular size is normal. No increase in right ventricular wall thickness. 3. Left atrial size was normal. 4. Right atrial size was normal. 5. Moderate pericardial effusion. 6. The pericardial effusion is circumferential. 7. The mitral valve is normal in structure. No evidence of mitral valve regurgitation. No evidence of mitral stenosis. 8. The tricuspid valve is normal in structure. Tricuspid valve regurgitation is trivial. 9. The aortic valve is normal in structure. Aortic valve regurgitation was not visualized by color flow Doppler. Structurally normal aortic valve, with no evidence of sclerosis or stenosis. 10. The pulmonic valve was normal in structure. Pulmonic valve regurgitation is not visualized by color flow Doppler. 11. Normal pulmonary artery systolic pressure. 12. The inferior vena cava is normal in size with greater than 50% respiratory variability, suggesting right atrial pressure of 3 mmHg. 13. Compared to 08/26/2019, the pericardial effusion appears larger, but there is no evidence of tamponade.   Echo 08/26/19 1. Left ventricular ejection fraction, by visual estimation, is 60 to 65%. The left ventricle has normal function. Normal left ventricular size. There is mildly increased left ventricular hypertrophy. 2. Left ventricular diastolic Doppler parameters are consistent with impaired relaxation pattern of LV diastolic filling. 3. The aortic valve is tricuspid Aortic valve regurgitation was not visualized by color flow Doppler. Mild aortic valve sclerosis without stenosis. 4. Left atrial size was normal. 5. Right atrial size was normal. 6. Global right ventricle has normal systolic function.The right ventricular size is normal. No increase in right ventricular wall thickness. 7. Trivial pericardial effusion is present. 8. The tricuspid valve is normal in structure. Tricuspid valve regurgitation was not visualized by color flow Doppler. 9. The mitral valve is normal in structure. No evidence of mitral valve regurgitation. No evidence of mitral stenosis. 10. TR signal is inadequate for assessing pulmonary artery systolic pressure. 11. The inferior vena cava is normal in size with greater than 50% respiratory variability, suggesting right atrial pressure of 3 mmHg.  Echo 10/2: LVEF 65-70%.  RV poorly visualized.  Small pericardial effusion, significantly improved from prior.  Moderate pleural effusion.  No tamponade.  Patient Profile     63 y.o. female with end-stage renal disease on HD, paroxysmal atrial fibrillation, diabetes, hypertension, OSA, and obesity admitted with chest pain.  She was found to have pericardial effusion and tamponade.  She underwent pericardial window with Dr. Kipp Brood on 9/29.  Assessment & Plan    # Cardiac tamponade: # Pericardial effusion: Repeat echo today with small effusion improved from prior and no tamponade.  Pericardial drain will be removed today.  OK to resume Eliquis today per CT surgery.  Continue colchicine.  Given that she is  on Eliquis  we will not do high-dose aspirin.  Continue to wean phenylephrine as BP tolerates.  Continue midodrine. # Paroxysmal atrial fibrillation:  She has been in and out of afib.  This AM she had atrial fibrillation with RVR and converted back to sinus around 6:30.  Continue amiodarone load.  BP too low for nodal agents. Could use one time dose of digoxin if needed.  Would not use continuously given ESRD on HD.  # ESRD on HD: Per nephrology.  Continue midodrine and weaning phenylephrine for BP support.  # Hyperlipidemia:  Refuses statin.     Time spent: 35 minutes-Greater than 50% of this time was spent in counseling, explanation of diagnosis, planning of further management, and coordination of care.      For questions or updates, please contact [place] Please consult [email or web address] for contact info under        Signed, Skeet Latch, MD  09/04/2019, 10:14 AM

## 2019-09-04 NOTE — Progress Notes (Signed)
Patient ID: Brenda Arnold, female   DOB: 03/14/1956, 63 y.o.   MRN: [id number] [facility] DAILY ICU PROGRESS NOTE                   [address]            [place] [address]          [phone]   4 Days Post-Op Procedure(s) (LRB): VIDEO ASSISTED THORACOSCOPY/ DRAINAGE OF PERICARDIAL EFFUSION/ PERICARDIAL WINDOW/ ABORTED PERICARDIOCENTESIS  (Right)  Total Length of Stay:  LOS: 9 days   Subjective: Patient with episodes of atrial fib previously, atrial fib was noted this morning when she got up and went into the bathroom  Objective: Vital signs in last 24 hours: Temp:  [98.3 F (36.8 C)-100 F (37.8 C)] 99.8 F (37.7 C) (10/03 0802) Pulse Rate:  [56-142] 88 (10/03 0730) Cardiac Rhythm: Normal sinus rhythm (10/03 0400) Resp:  [0-30] 22 (10/03 0730) BP: (75-150)/(41-74) 83/51 (10/03 0730) SpO2:  [92 %-100 %] 97 % (10/03 0730) Weight:  [104 kg-106.4 kg] 105 kg (10/03 0530)  Filed Weights   09/03/19 1435 09/03/19 1842 09/04/19 0530  Weight: 106.4 kg 104 kg 105 kg    Weight change: -1.6 kg   Hemodynamic parameters for last 24 hours:    Intake/Output from previous day: 10/02 0701 - 10/03 0700 In: 1173 [P.O.:480; I.V.:693] Out: 2406 [Chest Tube:330]  Intake/Output this shift: No intake/output data recorded.  Current Meds: Scheduled Meds: . [START ON 09/05/2019] amiodarone  200 mg Oral BID   Followed by  . [START ON 09/12/2019] amiodarone  200 mg Oral Daily  . amiodarone  400 mg Oral BID  . atorvastatin  40 mg Oral q1800  . calcitRIOL  0.75 mcg Oral Q M,W,F-HD  . Chlorhexidine Gluconate Cloth  6 each Topical Q0600  . colchicine  0.3 mg Oral Q M,W,F-HD  . [START ON 09/10/2019] darbepoetin (ARANESP) injection - DIALYSIS  60 mcg Intravenous Q Fri-HD  . docusate sodium  100 mg Oral BID  . gabapentin  100 mg Oral QHS  . insulin aspart  0-9 Units Subcutaneous TID WC  . mouth rinse  15 mL Mouth Rinse BID  . midodrine  10 mg Oral TID WC  . multivitamin  1  tablet Oral QHS  . polyethylene glycol  17 g Oral Daily  . sucroferric oxyhydroxide  1,000 mg Oral TID WC & HS   Continuous Infusions: . sodium chloride 35 mL/hr at 09/04/19 0700  . amiodarone Stopped (09/04/19 0626)   Followed by  . amiodarone    . amiodarone    . phenylephrine (NEO-SYNEPHRINE) Adult infusion 10 mcg/min (09/04/19 0210)   PRN Meds:.acetaminophen **OR** acetaminophen, HYDROmorphone (DILAUDID) injection, menthol-cetylpyridinium, ondansetron (ZOFRAN) IV, senna-docusate, traMADol  General appearance: alert, cooperative and no distress Neurologic: intact Heart: regular rate and rhythm, S1, S2 normal, no murmur, click, rub or gallop Lungs: diminished breath sounds LLL  Lab Results: CBC: Recent Labs    09/03/19 1047 09/04/19 0422  WBC 11.2* 12.7*  HGB 9.4* 9.7*  HCT 29.6* 30.4*  PLT 356 350   BMET:  Recent Labs    09/03/19 1047 09/04/19 0422  NA 136 137  K 4.1 4.0  CL 99 98  CO2 26 29  GLUCOSE 110* 90  BUN 30* 19  CREATININE 8.74* 6.81*  CALCIUM 8.1* 8.2*    CMET: Lab Results  Component Value Date   WBC 12.7 (H) 09/04/2019   HGB 9.7 (L) 09/04/2019   HCT  30.4 (L) 09/04/2019   PLT 350 09/04/2019   GLUCOSE 90 09/04/2019   CHOL 175 08/27/2019   TRIG 83 08/27/2019   HDL 43 08/27/2019   LDLCALC 115 (H) 08/27/2019   ALT 43 08/30/2019   AST 34 08/30/2019   NA 137 09/04/2019   K 4.0 09/04/2019   CL 98 09/04/2019   CREATININE 6.81 (H) 09/04/2019   BUN 19 09/04/2019   CO2 29 09/04/2019   TSH 1.026 08/30/2019   INR 1.97 11/09/2018   HGBA1C 4.9 09/01/2019      PT/INR: No results for input(s): LABPROT, INR in the last 72 hours. Radiology: No results found.   Assessment/Plan: S/P Procedure(s) (LRB): VIDEO ASSISTED THORACOSCOPY/ DRAINAGE OF PERICARDIAL EFFUSION/ PERICARDIAL WINDOW/ ABORTED PERICARDIOCENTESIS  (Right) d/c tubes/lines Pericardial tube drainage dropped to less than 50 mL for the last 12 hours We will plan to remove the  pericardial tube today    Grace Isaac 09/04/2019 9:09 AM

## 2019-09-05 ENCOUNTER — Inpatient Hospital Stay (HOSPITAL_COMMUNITY): Payer: BC Managed Care – PPO

## 2019-09-05 LAB — RENAL FUNCTION PANEL
Albumin: 2.2 g/dL — ABNORMAL LOW (ref 3.5–5.0)
Anion gap: 12 (ref 5–15)
BUN: 31 mg/dL — ABNORMAL HIGH (ref 8–23)
CO2: 25 mmol/L (ref 22–32)
Calcium: 8.3 mg/dL — ABNORMAL LOW (ref 8.9–10.3)
Chloride: 100 mmol/L (ref 98–111)
Creatinine, Ser: 9.05 mg/dL — ABNORMAL HIGH (ref 0.44–1.00)
GFR calc Af Amer: 5 mL/min — ABNORMAL LOW (ref >60)
GFR calc non Af Amer: 4 mL/min — ABNORMAL LOW (ref >60)
Glucose, Bld: 98 mg/dL (ref 70–99)
Phosphorus: 3 mg/dL (ref 2.5–4.6)
Potassium: 4 mmol/L (ref 3.5–5.1)
Sodium: 137 mmol/L (ref 135–145)

## 2019-09-05 LAB — MAGNESIUM: Magnesium: 2 mg/dL (ref 1.7–2.4)

## 2019-09-05 LAB — GLUCOSE, CAPILLARY
Glucose-Capillary: 78 mg/dL (ref 70–99)
Glucose-Capillary: 91 mg/dL (ref 70–99)
Glucose-Capillary: 123 mg/dL — ABNORMAL HIGH (ref 70–99)
Glucose-Capillary: 101 mg/dL — ABNORMAL HIGH (ref 70–99)

## 2019-09-05 LAB — AEROBIC/ANAEROBIC CULTURE W GRAM STAIN (SURGICAL/DEEP WOUND)
Culture: NO GROWTH
Culture: NO GROWTH

## 2019-09-05 LAB — CBC WITH DIFFERENTIAL/PLATELET
Abs Immature Granulocytes: 0.15 10*3/uL — ABNORMAL HIGH (ref 0.00–0.07)
Basophils Absolute: 0.1 10*3/uL (ref 0.0–0.1)
Basophils Relative: 0 %
Eosinophils Absolute: 0.2 10*3/uL (ref 0.0–0.5)
Eosinophils Relative: 2 %
HCT: 30.4 % — ABNORMAL LOW (ref 36.0–46.0)
Hemoglobin: 9.8 g/dL — ABNORMAL LOW (ref 12.0–15.0)
Immature Granulocytes: 1 %
Lymphocytes Relative: 11 %
Lymphs Abs: 1.5 10*3/uL (ref 0.7–4.0)
MCH: 33.1 pg (ref 26.0–34.0)
MCHC: 32.2 g/dL (ref 30.0–36.0)
MCV: 102.7 fL — ABNORMAL HIGH (ref 80.0–100.0)
Monocytes Absolute: 1.5 10*3/uL — ABNORMAL HIGH (ref 0.1–1.0)
Monocytes Relative: 11 %
Neutro Abs: 10.5 10*3/uL — ABNORMAL HIGH (ref 1.7–7.7)
Neutrophils Relative %: 75 %
Platelets: 320 10*3/uL (ref 150–400)
RBC: 2.96 MIL/uL — ABNORMAL LOW (ref 3.87–5.11)
RDW: 15.7 % — ABNORMAL HIGH (ref 11.5–15.5)
WBC: 13.9 10*3/uL — ABNORMAL HIGH (ref 4.0–10.5)
nRBC: 0 % (ref 0.0–0.2)

## 2019-09-05 MED ORDER — CHLORHEXIDINE GLUCONATE CLOTH 2 % EX PADS: 6.0000 | MEDICATED_PAD | Freq: Every day | CUTANEOUS | Status: DC

## 2019-09-05 NOTE — Progress Notes (Signed)
[facility]  PROGRESS NOTE    Brenda Arnold  [id number] DOB: [birth year] DOA: 08/25/2019 PCP: Kelton Pillar, MD   Brief Narrative:   63 year old female with ESRD, OSA, pAF who presented on 9/25 with hypotension in setting of hemodialysis and transferred to ICU on 9/29 for cardiogenic shock. PCCM consulted for cardiogenic shock secondary to tamponade.  10/2: C/o pain at and around CT insertion site; c/o constipation 10/3: Says she did better ON. Had BM. Generally in better spirit today. 10/4: Denies complaints today. Continuing to wean pressors. Drains now out.    Assessment & Plan:   Principal Problem:   Chest pain Active Problems:   Type 2 diabetes mellitus with complication, without long-term current use of insulin (HCC)   OBESITY   OBESITY-MORBID (>100')   OSA (obstructive sleep apnea)   Hypotension   ESRD on hemodialysis (HCC)   CAD (coronary artery disease)   Tachycardia   Pericarditis   Cardiac tamponade   Cardiac Tamponade - presenting with atypical chest pain and hypotension - found to be in cardiac tamponade w/ pericardial effusions - now s/p pericardial window. CT management per CTS     - drain now removed  Acute on chronic hypotension - Off pressors.  - continue midodrine  Bilateral pleural effusions likely related to pulmonary edema in setting of volume overload - Patient on 2L nasal cannula, stable - CT now removed     - wean O2 as able     - repeat CXR: Findings suggesting mild interstitial edema with stable moderate size left effusion and stable small right effusion with associated bibasilar atelectasis. Infection in the mid to lower lungs is possible.     - now working with IS     - she's afebrile, WBC is slightly elevated; will hold off on abx for now and watch clinically     - can repeat CXR in couple days  Pericarditis - continue colchicine  PAF, AFRVR - Amiodarone - per cards: Continue amiodarone  for rhythm control. Amio 400 mg BID x 1 week (thru 10/3) and then 200 mg BID for 1 week then 200 mg daily - Holding eliquis per CTS; will be able to resume soon     - eliquis resumed  ESRD - MWF dialysis per Nephrology  Constipation - colace, miralax     - continue colace; miralax d/c'd  DVT prophylaxis: eliquis Code Status: FULL   Disposition Plan: TBD  Consultants:   [facility]  Neprhology   ROS:  Denies CP, dyspnea. Reports some weakness with going to bathroom. Remainder 10-pt ROS is negative for all not previously mentioned.  Subjective: "Oh. Yes... that isn't easy."  Objective: Vitals:   09/05/19 0600 09/05/19 0733 09/05/19 0900 09/05/19 1141  BP: 114/66 (!) 93/57 (!) 105/57 (!) 106/59  Pulse: 73 79 74 77  Resp:  15 13 (!) 21  Temp:  98.9 F (37.2 C)  98 F (36.7 C)  TempSrc:  Oral  Oral  SpO2: 100% 97% 97%   Weight:      Height:        Intake/Output Summary (Last 24 hours) at 09/05/2019 1247 Last data filed at 09/05/2019 0600 Gross per 24 hour  Intake 1108.66 ml  Output 0 ml  Net 1108.66 ml   Filed Weights   09/03/19 1435 09/03/19 1842 09/04/19 0530  Weight: 106.4 kg 104 kg 105 kg    Examination:  General: 63 y.o. female resting in bed in NAD Cardiovascular: RRR, +S1,  S2, no m/g/r, equal pulses throughout Respiratory: CTABL, no w/r/r, normal WOB GI: BS+, NDNT, obese, no masses noted, no organomegaly noted MSK: No e/c/c; TED noted Skin: No rashes, bruises, ulcerations noted Neuro: A&O x 3, no focal deficits Psyc: Appropriate interaction and affect, calm/cooperative   Data Reviewed: I have personally reviewed following labs and imaging studies.  CBC: Recent Labs  Lab 09/01/19 0357 09/02/19 0219 09/03/19 1047 09/04/19 0422 09/05/19 0906  WBC 10.4 11.9* 11.2* 12.7* 13.9*  NEUTROABS  --   --  8.7* 9.4* 10.5*  HGB 9.7* 9.2* 9.4* 9.7* 9.8*  HCT 30.2* 29.7* 29.6* 30.4* 30.4*  MCV 102.0* 102.1* 101.7* 101.0* 102.7*   PLT 290 332 356 350 [id number]   Basic Metabolic Panel: Recent Labs  Lab 08/31/19 1123 09/01/19 0357 09/02/19 0219 09/03/19 1047 09/04/19 0422 09/05/19 0906  NA 138 137 136 136 137 137  K 5.3* 5.1 4.1 4.1 4.0 4.0  CL 97* 100 100 99 98 100  CO2 27 24 28 26 29 25   GLUCOSE 118* 100* 115* 110* 90 98  BUN 37* 44* 18 30* 19 31*  CREATININE 9.54* 10.38* 5.88* 8.74* 6.81* 9.05*  CALCIUM 8.7* 8.5* 8.0* 8.1* 8.2* 8.3*  MG 2.1  --   --  1.9 1.9 2.0  PHOS 3.5  --  2.9 3.3 2.4* 3.0   GFR: Estimated Creatinine Clearance: 7.9 mL/min (A) (by C-G formula based on SCr of 9.05 mg/dL (H)). Liver Function Tests: Recent Labs  Lab 08/30/19 1250 09/02/19 0219 09/03/19 1047 09/04/19 0422 09/05/19 0906  AST 34  --   --   --   --   ALT 43  --   --   --   --   ALKPHOS 133*  --   --   --   --   BILITOT 0.4  --   --   --   --   PROT 7.1  --   --   --   --   ALBUMIN 3.1* 2.6* 2.4* 2.3* 2.2*   No results for input(s): LIPASE, AMYLASE in the last 168 hours. No results for input(s): AMMONIA in the last 168 hours. Coagulation Profile: No results for input(s): INR, PROTIME in the last 168 hours. Cardiac Enzymes: No results for input(s): CKTOTAL, CKMB, CKMBINDEX, TROPONINI in the last 168 hours. BNP (last 3 results) No results for input(s): PROBNP in the last 8760 hours. HbA1C: No results for input(s): HGBA1C in the last 72 hours. CBG: Recent Labs  Lab 09/04/19 1212 09/04/19 1559 09/04/19 2127 09/05/19 0651 09/05/19 1139  GLUCAP 163* 84 94 78 91   Lipid Profile: No results for input(s): CHOL, HDL, LDLCALC, TRIG, CHOLHDL, LDLDIRECT in the last 72 hours. Thyroid Function Tests: No results for input(s): TSH, T4TOTAL, FREET4, T3FREE, THYROIDAB in the last 72 hours. Anemia Panel: Recent Labs    09/03/19 2106  TIBC 151*  IRON 30   Sepsis Labs: No results for input(s): PROCALCITON, LATICACIDVEN in the last 168 hours.  Recent Results (from the past 240 hour(s))  Surgical PCR screen     Status:  None   Collection Time: 08/31/19 11:26 AM   Specimen: Nasal Swab  Result Value Ref Range Status   MRSA, PCR NEGATIVE NEGATIVE Final   Staphylococcus aureus NEGATIVE NEGATIVE Final    Comment: (NOTE) The Xpert SA Assay (FDA approved for NASAL specimens in patients 73 years of age and older), is one component of a comprehensive surveillance program. It is not intended to diagnose infection nor  to guide or monitor treatment. Performed at [facility], [place] [address] [place], [place] [address]   Aerobic/Anaerobic Culture (surgical/deep wound)     Status: None (Preliminary result)   Collection Time: 08/31/19  2:17 PM   Specimen: PATH Cytology Misc. fluid; Body Fluid  Result Value Ref Range Status   Specimen Description PERICARDIAL  Final   Special Requests NONE  Final   Gram Stain   Final    MODERATE WBC PRESENT,BOTH PMN AND MONONUCLEAR NO ORGANISMS SEEN    Culture   Final    NO GROWTH 4 DAYS NO ANAEROBES ISOLATED; CULTURE IN PROGRESS FOR 5 DAYS Performed at [facility], [address] [place], [place] [address]    Report Status PENDING  Incomplete  Aerobic/Anaerobic Culture (surgical/deep wound)     Status: None (Preliminary result)   Collection Time: 08/31/19  2:29 PM   Specimen: Pericardial; Tissue  Result Value Ref Range Status   Specimen Description PERICARDIAL  Final   Special Requests NONE  Final   Gram Stain   Final    FEW WBC PRESENT, PREDOMINANTLY MONONUCLEAR NO ORGANISMS SEEN    Culture   Final    NO GROWTH 4 DAYS NO ANAEROBES ISOLATED; CULTURE IN PROGRESS FOR 5 DAYS Performed at [facility], [place] [address] [place], [place] [address]    Report Status PENDING  Incomplete      Radiology Studies: Dg Chest Port 1 View  Result Date: 09/05/2019 CLINICAL DATA:  Respiratory status, chest tube. EXAM: PORTABLE CHEST 1 VIEW COMPARISON:  09/04/2019 FINDINGS: Right IJ central venous catheter unchanged. Lungs are adequately inflated demonstrate no  change in a moderate size left effusion likely with associated basilar atelectasis. No change small right effusion with associated basilar atelectasis. Mild hazy prominence of the perihilar vessels suggesting a component of interstitial edema. Remainder of the exam is unchanged. IMPRESSION: Findings suggesting mild interstitial edema with stable moderate size left effusion and stable small right effusion with associated bibasilar atelectasis. Infection in the mid to lower lungs is possible. Electronically Signed   By: Marin Olp M.D.   On: 09/05/2019 09:06   Dg Chest Port 1 View  Result Date: 09/04/2019 CLINICAL DATA:  Pneumothorax. EXAM: PORTABLE CHEST 1 VIEW COMPARISON:  09/02/2019 FINDINGS: The right-sided central venous catheter is well position. There is no pneumothorax. Again noted is a moderate to large left-sided pleural effusion with adjacent airspace disease which may represent compressive atelectasis. There is a right-sided chest tube in place. There is a small to moderate size right-sided pleural effusion prominent interstitial lung markings are noted. The heart size remains unchanged. Aortic calcifications are noted. IMPRESSION: 1. Lines and tubes as above. 2. Moderate to large left-sided pleural effusion with adjacent presumed compressive atelectasis. 3. Small to moderate right-sided pleural effusion with adjacent atelectasis. Electronically Signed   By: Constance Holster M.D.   On: 09/04/2019 09:34     Scheduled Meds: . amiodarone  200 mg Oral BID   Followed by  . [START ON 09/12/2019] amiodarone  200 mg Oral Daily  . apixaban  5 mg Oral BID  . atorvastatin  40 mg Oral q1800  . calcitRIOL  0.75 mcg Oral Q M,W,F-HD  . Chlorhexidine Gluconate Cloth  6 each Topical Q0600  . colchicine  0.3 mg Oral Q M,W,F-HD  . [START ON 09/10/2019] darbepoetin (ARANESP) injection - NON-DIALYSIS  60 mcg Subcutaneous Q Fri-1800  . docusate sodium  100 mg Oral BID  . gabapentin  100 mg Oral QHS  .  insulin aspart  0-9 Units Subcutaneous TID WC  . mouth rinse  15 mL Mouth Rinse BID  . midodrine  10 mg Oral TID WC  . multivitamin  1 tablet Oral QHS  . sucroferric oxyhydroxide  1,000 mg Oral TID WC & HS   Continuous Infusions: . sodium chloride 35 mL/hr at 09/05/19 0600  . amiodarone    . phenylephrine (NEO-SYNEPHRINE) Adult infusion 10 mcg/min (09/05/19 0200)     LOS: 10 days    Time spent: 25 minutes spent in the coordination of care today.    Jonnie Finner, DO [facility] Pager [phone]  If 7PM-7AM, please contact night-coverage [email or web address] Password [email or web address] 09/05/2019, 12:47 PM

## 2019-09-05 NOTE — Progress Notes (Signed)
[facility] Progress Note   Subjective:  Stable, chest tube has been pulled. 3-lumen still in.   Objective Vitals:   09/05/19 0733 09/05/19 0900 09/05/19 1141 09/05/19 1527  BP: (!) 93/57 (!) 105/57 (!) 106/59 (!) 100/49  Pulse: 79 74 77 74  Resp: 15 13 (!) 21   Temp: 98.9 F (37.2 C)  98 F (36.7 C) 99.2 F (37.3 C)  TempSrc: Oral  Oral Oral  SpO2: 97% 97%    Weight:      Height:       Physical Exam General: Well developed, well nourished female, fatigued, obese Heart: RRR, no murmurs, rubs or gallops Lungs: CTA bilaterally without wheezing, rhonchi or rales Abdomen: Soft, non-tender, non-distended. +BS Extremities: No LE edema Dialysis Access:  L forearm AVF with two aneurysms+Bruit  Dialysis: [facility] MWF   4h 200Re   450/800   99kg   2/2 bath  Hep 10000  L AVF Parsabiv 15 Calcitriol 0.75 Recent OP Labs: Hgb 12.2 K 4.7 Ca 8.3 Phos 3.9 PTH 1960  Assessment/Plan: 1. Pericardial effusion/ tamponade/ acute pericarditis - EKG with diffuse ST elevation. Echo showed LVEF of 60-65%, normal RV function.  CP improved but hypotension worsened and pt taken for pericardial window on 08/31/19. BP's improved w/ drainage of effusion. Per Card surg and cardiology.  2. Hypertension/volume:chronic BP issue on midodrine. Up 5 kg by wt's, mild edema on exam. Rewrote for fluid restriction. UF 2-3 kg next HD 3. ESRD:HD MWF. Next HD Monday.  Was on eliquis at home and heparin 10K with HD.  Will give prn hep at 50% outside dose, 2500 x 2 prn 4. L AVF aneurysms: evaluated by Dr. Donnetta Hutching, reports it is safe to use access and to avoid areas of superficial blistering 5. Atrial fib: Converted to NSR. Transitioning to po amio. Eliquis held for 3 days, now has been resumed.  6. Anemia ckd + abl: Hb low 9's, will start on darbe 60 ug 1st dose today 10/3, then weekly on Friday.  7. Metabolic bone disease: Continue bindersand calcitriol. On Parasbivfor 2HPTH- not availablein hospital.  Resume at discharge.    Kelly Splinter  MD 09/01/2019, 12:06 PM  Additional Objective Labs: Basic Metabolic Panel: Recent Labs  Lab 09/03/19 1047 09/04/19 0422 09/05/19 0906  NA 136 137 137  K 4.1 4.0 4.0  CL 99 98 100  CO2 26 29 25   GLUCOSE 110* 90 98  BUN 30* 19 31*  CREATININE 8.74* 6.81* 9.05*  CALCIUM 8.1* 8.2* 8.3*  PHOS 3.3 2.4* 3.0   Liver Function Tests: Recent Labs  Lab 08/30/19 1250  09/03/19 1047 09/04/19 0422 09/05/19 0906  AST 34  --   --   --   --   ALT 43  --   --   --   --   ALKPHOS 133*  --   --   --   --   BILITOT 0.4  --   --   --   --   PROT 7.1  --   --   --   --   ALBUMIN 3.1*   < > 2.4* 2.3* 2.2*   < > = values in this interval not displayed.   CBC: Recent Labs  Lab 09/01/19 0357 09/02/19 0219 09/03/19 1047 09/04/19 0422 09/05/19 0906  WBC 10.4 11.9* 11.2* 12.7* 13.9*  NEUTROABS  --   --  8.7* 9.4* 10.5*  HGB 9.7* 9.2* 9.4* 9.7* 9.8*  HCT 30.2* 29.7* 29.6* 30.4* 30.4*  MCV 102.0* 102.1* 101.7* 101.0* 102.7*  PLT 290 332 356 350 320   Blood Culture    Component Value Date/Time   SDES PERICARDIAL 08/31/2019 1429   SPECREQUEST NONE 08/31/2019 1429   CULT  08/31/2019 1429    No growth aerobically or anaerobically. Performed at [facility], [place] [address] [place], [place] [address]    REPTSTATUS 09/05/2019 FINAL 08/31/2019 1429    CBG: Recent Labs  Lab 09/04/19 1212 09/04/19 1559 09/04/19 2127 09/05/19 0651 09/05/19 1139  GLUCAP 163* 84 94 78 91   Medications: . amiodarone    . phenylephrine (NEO-SYNEPHRINE) Adult infusion 10 mcg/min (09/05/19 0200)   . amiodarone  200 mg Oral BID   Followed by  . [START ON 09/12/2019] amiodarone  200 mg Oral Daily  . apixaban  5 mg Oral BID  . atorvastatin  40 mg Oral q1800  . calcitRIOL  0.75 mcg Oral Q M,W,F-HD  . Chlorhexidine Gluconate Cloth  6 each Topical Q0600  . colchicine  0.3 mg Oral Q M,W,F-HD  . [START ON 09/10/2019] darbepoetin (ARANESP) injection -  NON-DIALYSIS  60 mcg Subcutaneous Q Fri-1800  . docusate sodium  100 mg Oral BID  . gabapentin  100 mg Oral QHS  . insulin aspart  0-9 Units Subcutaneous TID WC  . mouth rinse  15 mL Mouth Rinse BID  . midodrine  10 mg Oral TID WC  . multivitamin  1 tablet Oral QHS  . sucroferric oxyhydroxide  1,000 mg Oral TID WC & HS

## 2019-09-05 NOTE — Progress Notes (Signed)
Progress Note  Patient Name: Brenda Arnold Date of Encounter: 09/05/2019  Primary Cardiologist: Fransico Him, MD   Subjective   Feeling better.  Mild pleuritic pain.  She was very weak with walking.   Inpatient Medications    Scheduled Meds:  amiodarone  200 mg Oral BID   Followed by   Derrill Memo ON 09/12/2019] amiodarone  200 mg Oral Daily   apixaban  5 mg Oral BID   atorvastatin  40 mg Oral q1800   calcitRIOL  0.75 mcg Oral Q M,W,F-HD   Chlorhexidine Gluconate Cloth  6 each Topical Q0600   colchicine  0.3 mg Oral Q M,W,F-HD   [START ON 09/10/2019] darbepoetin (ARANESP) injection - NON-DIALYSIS  60 mcg Subcutaneous Q Fri-1800   docusate sodium  100 mg Oral BID   gabapentin  100 mg Oral QHS   insulin aspart  0-9 Units Subcutaneous TID WC   mouth rinse  15 mL Mouth Rinse BID   midodrine  10 mg Oral TID WC   multivitamin  1 tablet Oral QHS   sucroferric oxyhydroxide  1,000 mg Oral TID WC & HS   Continuous Infusions:  sodium chloride 35 mL/hr at 09/05/19 0600   amiodarone     phenylephrine (NEO-SYNEPHRINE) Adult infusion 10 mcg/min (09/05/19 0200)   PRN Meds: acetaminophen **OR** acetaminophen, HYDROmorphone (DILAUDID) injection, menthol-cetylpyridinium, ondansetron (ZOFRAN) IV, senna-docusate, traMADol   Vital Signs    Vitals:   09/05/19 0500 09/05/19 0600 09/05/19 0733 09/05/19 0900  BP: (!) 112/59 114/66 (!) 93/57 (!) 105/57  Pulse: 72 73 79 74  Resp:   15 13  Temp:   98.9 F (37.2 C)   TempSrc:   Oral   SpO2: 100% 100% 97% 97%  Weight:      Height:        Intake/Output Summary (Last 24 hours) at 09/05/2019 0950 Last data filed at 09/05/2019 0600 Gross per 24 hour  Intake 1488.88 ml  Output 0 ml  Net 1488.88 ml   Last 3 Weights 09/04/2019 09/03/2019 09/03/2019  Weight (lbs) 231 lb 7.7 oz 229 lb 4.5 oz 234 lb 9.1 oz  Weight (kg) 105 kg 104 kg 106.4 kg      Telemetry    Sinus rhythm.  7 beats of NSVT - Personally Reviewed  ECG     09/04/19: Atrial fibrillation.  Rate 138 bpm.  LAD. - Personally Reviewed  Physical Exam   VS:  BP (!) 105/57    Pulse 74    Temp 98.9 F (37.2 C) (Oral)    Resp 13    Ht 5\' 6"  (1.676 m)    Wt 105 kg    SpO2 97%    BMI 37.36 kg/m  , BMI Body mass index is 37.36 kg/m. GENERAL:  Chronically ill-appearing.  No acute distress. HEENT: Pupils equal round and reactive, fundi not visualized, oral mucosa unremarkable NECK:  No jugular venous distention, waveform within normal limits, carotid upstroke brisk and symmetric, no bruits LUNGS:  Clear to auscultation bilaterally HEART:  RRR.  PMI not displaced or sustained,S1 and S2 within normal limits, no S3, no S4, no clicks, no rubs, no murmurs ABD:  Flat, positive bowel sounds normal in frequency in pitch, no bruits, no rebound, no guarding, no midline pulsatile mass, no hepatomegaly, no splenomegaly EXT:  2 plus pulses throughout, no edema, no cyanosis no clubbing SKIN:  No rashes no nodules NEURO:  Cranial nerves II through XII grossly intact, motor grossly intact throughout PSYCH:  Cognitively  intact, oriented to person place and time   Labs    High Sensitivity Troponin:   Recent Labs  Lab 08/24/19 1747 08/24/19 2022 08/25/19 0931 08/25/19 1124  TROPONINIHS 7 8 12 10       Chemistry Recent Labs  Lab 08/30/19 1250  09/02/19 0219 09/03/19 1047 09/04/19 0422  NA  --    < > 136 136 137  K  --    < > 4.1 4.1 4.0  CL  --    < > 100 99 98  CO2  --    < > 28 26 29   GLUCOSE  --    < > 115* 110* 90  BUN  --    < > 18 30* 19  CREATININE  --    < > 5.88* 8.74* 6.81*  CALCIUM  --    < > 8.0* 8.1* 8.2*  PROT 7.1  --   --   --   --   ALBUMIN 3.1*  --  2.6* 2.4* 2.3*  AST 34  --   --   --   --   ALT 43  --   --   --   --   ALKPHOS 133*  --   --   --   --   BILITOT 0.4  --   --   --   --   GFRNONAA  --    < > 7* 4* 6*  GFRAA  --    < > 8* 5* 7*  ANIONGAP  --    < > 8 11 10    < > = values in this interval not displayed.      Hematology Recent Labs  Lab 09/02/19 0219 09/03/19 1047 09/04/19 0422  WBC 11.9* 11.2* 12.7*  RBC 2.91* 2.91* 3.01*  HGB 9.2* 9.4* 9.7*  HCT 29.7* 29.6* 30.4*  MCV 102.1* 101.7* 101.0*  MCH 31.6 32.3 32.2  MCHC 31.0 31.8 31.9  RDW 15.1 15.2 15.0  PLT 332 356 350    BNPNo results for input(s): BNP, PROBNP in the last 168 hours.   DDimer No results for input(s): DDIMER in the last 168 hours.   Radiology    Dg Chest Port 1 View  Result Date: 09/05/2019 CLINICAL DATA:  Respiratory status, chest tube. EXAM: PORTABLE CHEST 1 VIEW COMPARISON:  09/04/2019 FINDINGS: Right IJ central venous catheter unchanged. Lungs are adequately inflated demonstrate no change in a moderate size left effusion likely with associated basilar atelectasis. No change small right effusion with associated basilar atelectasis. Mild hazy prominence of the perihilar vessels suggesting a component of interstitial edema. Remainder of the exam is unchanged. IMPRESSION: Findings suggesting mild interstitial edema with stable moderate size left effusion and stable small right effusion with associated bibasilar atelectasis. Infection in the mid to lower lungs is possible. Electronically Signed   By: Marin Olp M.D.   On: 09/05/2019 09:06   Dg Chest Port 1 View  Result Date: 09/04/2019 CLINICAL DATA:  Pneumothorax. EXAM: PORTABLE CHEST 1 VIEW COMPARISON:  09/02/2019 FINDINGS: The right-sided central venous catheter is well position. There is no pneumothorax. Again noted is a moderate to large left-sided pleural effusion with adjacent airspace disease which may represent compressive atelectasis. There is a right-sided chest tube in place. There is a small to moderate size right-sided pleural effusion prominent interstitial lung markings are noted. The heart size remains unchanged. Aortic calcifications are noted. IMPRESSION: 1. Lines and tubes as above. 2. Moderate to large left-sided pleural effusion with  adjacent presumed  compressive atelectasis. 3. Small to moderate right-sided pleural effusion with adjacent atelectasis. Electronically Signed   By: Constance Holster M.D.   On: 09/04/2019 09:34    Cardiac Studies   Limited echo 08/31/19: 1. Left ventricular ejection fraction, by visual estimation, is 55 to 60%. The left ventricle has normal function. There is no left ventricular hypertrophy. 2. Global right ventricle was not well visualized.The right ventricular size is not well visualized. Right vetricular wall thickness was not assessed. 3. Left atrial size was normal. 4. Right atrial size was not well visualized. 5. Large pericardial effusion. 6. The mitral valve was not assessed. Not assessed mitral valve regurgitation. 7. The tricuspid valve is not assessed. Tricuspid valve regurgitation was not assessed by color flow Doppler. 8. The aortic valve was not assessed Aortic valve regurgitation was not assessed by color flow Doppler. 9. The pulmonic valve was not well visualized. Pulmonic valve regurgitation was not assessed by color flow Doppler. 10. Aortic root could not be assessed. 11. Limited study for pericardial effusion; large pericardial effusion; no RV diastolic collapse; IVC dilated; cannot R/O early tamponade; echo is larger compared to 08/28/19. 12. The interatrial septum was not assessed.  Limited Echo 08/28/19 1. Left ventricular ejection fraction, by visual estimation, is 60 to 65%. The left ventricle has normal function. Normal left ventricular size. There is no left ventricular hypertrophy. 2. Global right ventricle has normal systolic function.The right ventricular size is normal. No increase in right ventricular wall thickness. 3. Left atrial size was normal. 4. Right atrial size was normal. 5. Moderate pericardial effusion. 6. The pericardial effusion is circumferential. 7. The mitral valve is normal in structure. No evidence of mitral valve regurgitation. No evidence of  mitral stenosis. 8. The tricuspid valve is normal in structure. Tricuspid valve regurgitation is trivial. 9. The aortic valve is normal in structure. Aortic valve regurgitation was not visualized by color flow Doppler. Structurally normal aortic valve, with no evidence of sclerosis or stenosis. 10. The pulmonic valve was normal in structure. Pulmonic valve regurgitation is not visualized by color flow Doppler. 11. Normal pulmonary artery systolic pressure. 12. The inferior vena cava is normal in size with greater than 50% respiratory variability, suggesting right atrial pressure of 3 mmHg. 13. Compared to 08/26/2019, the pericardial effusion appears larger, but there is no evidence of tamponade.  Echo 08/26/19 1. Left ventricular ejection fraction, by visual estimation, is 60 to 65%. The left ventricle has normal function. Normal left ventricular size. There is mildly increased left ventricular hypertrophy. 2. Left ventricular diastolic Doppler parameters are consistent with impaired relaxation pattern of LV diastolic filling. 3. The aortic valve is tricuspid Aortic valve regurgitation was not visualized by color flow Doppler. Mild aortic valve sclerosis without stenosis. 4. Left atrial size was normal. 5. Right atrial size was normal. 6. Global right ventricle has normal systolic function.The right ventricular size is normal. No increase in right ventricular wall thickness. 7. Trivial pericardial effusion is present. 8. The tricuspid valve is normal in structure. Tricuspid valve regurgitation was not visualized by color flow Doppler. 9. The mitral valve is normal in structure. No evidence of mitral valve regurgitation. No evidence of mitral stenosis. 10. TR signal is inadequate for assessing pulmonary artery systolic pressure. 11. The inferior vena cava is normal in size with greater than 50% respiratory variability, suggesting right atrial pressure of 3 mmHg.  Echo 10/2: LVEF  65-70%.  RV poorly visualized.  Small pericardial effusion, significantly improved from prior.  Moderate pleural effusion.  No tamponade.  Patient Profile     63 y.o. female with end-stage renal disease on HD, paroxysmal atrial fibrillation, diabetes, hypertension, OSA, and obesity admitted with chest pain.  She was found to have pericardial effusion and tamponade.  She underwent pericardial window with Dr. Kipp Brood on 9/29.  Assessment & Plan    # Cardiac tamponade: # Pericardial effusion: Repeat echo showed a small effusion improved from prior and no tamponade.  Pericardial drain removed 10/3. She is back on Eliquis and H/H stable.  Continue colchicine for 3 months.  Given that she is on Eliquis we will not do high-dose aspirin.  No NSAIDS 2/2 ESRD.  She is now off phenylephrine.  Continue midodrine.  # Paroxysmal atrial fibrillation:  Currently remains in sinus rhythm.  Continue amiodarone load.  BP too low for nodal agents. Could use one time dose of digoxin if needed.  Would not use continuously given ESRD on HD.  Restarted Eliquis 10/3.  # ESRD on HD: Per nephrology.  Continue midodrine and weaning phenylephrine for BP support.  # Hyperlipidemia:  Refuses statin.   # Deconditioning: Will order PT.    Time spent: 35 minutes-Greater than 50% of this time was spent in counseling, explanation of diagnosis, planning of further management, and coordination of care.      For questions or updates, please contact [place] Please consult [email or web address] for contact info under        Signed, Skeet Latch, MD  09/05/2019, 9:50 AM

## 2019-09-05 NOTE — Progress Notes (Signed)
Patient ID: Brenda Arnold, female   DOB: [birth year], 63 y.o.   MRN: [id number] [facility] DAILY ICU PROGRESS NOTE                   [address]            [place], [id number]          [phone]   5 Days Post-Op Procedure(s) (LRB): VIDEO ASSISTED THORACOSCOPY/ DRAINAGE OF PERICARDIAL EFFUSION/ PERICARDIAL WINDOW/ ABORTED PERICARDIOCENTESIS  (Right)  Total Length of Stay:  LOS: 10 days   Subjective: Patient feels much better  Objective: Vital signs in last 24 hours: Temp:  [98.5 F (36.9 C)-100 F (37.8 C)] 98.9 F (37.2 C) (10/04 0733) Pulse Rate:  [64-86] 74 (10/04 0900) Cardiac Rhythm: Normal sinus rhythm (10/04 0400) Resp:  [0-28] 13 (10/04 0900) BP: (75-123)/(33-97) 105/57 (10/04 0900) SpO2:  [87 %-100 %] 97 % (10/04 0900)  Filed Weights   09/03/19 1435 09/03/19 1842 09/04/19 0530  Weight: 106.4 kg 104 kg 105 kg    Weight change:    Hemodynamic parameters for last 24 hours:    Intake/Output from previous day: 10/03 0701 - 10/04 0700 In: 1763.9 [P.O.:960; I.V.:803.9] Out: 0   Intake/Output this shift: No intake/output data recorded.  Current Meds: Scheduled Meds: . amiodarone  200 mg Oral BID   Followed by  . [START ON 09/12/2019] amiodarone  200 mg Oral Daily  . apixaban  5 mg Oral BID  . atorvastatin  40 mg Oral q1800  . calcitRIOL  0.75 mcg Oral Q M,W,F-HD  . Chlorhexidine Gluconate Cloth  6 each Topical Q0600  . colchicine  0.3 mg Oral Q M,W,F-HD  . [START ON 09/10/2019] darbepoetin (ARANESP) injection - NON-DIALYSIS  60 mcg Subcutaneous Q Fri-1800  . docusate sodium  100 mg Oral BID  . gabapentin  100 mg Oral QHS  . insulin aspart  0-9 Units Subcutaneous TID WC  . mouth rinse  15 mL Mouth Rinse BID  . midodrine  10 mg Oral TID WC  . multivitamin  1 tablet Oral QHS  . sucroferric oxyhydroxide  1,000 mg Oral TID WC & HS   Continuous Infusions: . sodium chloride 35 mL/hr at 09/05/19 0600  . amiodarone    . phenylephrine  (NEO-SYNEPHRINE) Adult infusion 10 mcg/min (09/05/19 0200)   PRN Meds:.acetaminophen **OR** acetaminophen, HYDROmorphone (DILAUDID) injection, menthol-cetylpyridinium, ondansetron (ZOFRAN) IV, senna-docusate, traMADol  General appearance: alert, cooperative and no distress Neurologic: intact Heart: irregularly irregular rhythm Lungs: diminished breath sounds LLL Wound: Intact  Lab Results: CBC: Recent Labs    09/03/19 1047 09/04/19 0422  WBC 11.2* 12.7*  HGB 9.4* 9.7*  HCT 29.6* 30.4*  PLT 356 350   BMET:  Recent Labs    09/03/19 1047 09/04/19 0422  NA 136 137  K 4.1 4.0  CL 99 98  CO2 26 29  GLUCOSE 110* 90  BUN 30* 19  CREATININE 8.74* 6.81*  CALCIUM 8.1* 8.2*    CMET: Lab Results  Component Value Date   WBC 12.7 (H) 09/04/2019   HGB 9.7 (L) 09/04/2019   HCT 30.4 (L) 09/04/2019   PLT 350 09/04/2019   GLUCOSE 90 09/04/2019   CHOL 175 08/27/2019   TRIG 83 08/27/2019   HDL 43 08/27/2019   LDLCALC 115 (H) 08/27/2019   ALT 43 08/30/2019   AST 34 08/30/2019   NA 137 09/04/2019   K 4.0 09/04/2019   CL 98 09/04/2019   CREATININE 6.81 (H)  09/04/2019   BUN 19 09/04/2019   CO2 29 09/04/2019   TSH 1.026 08/30/2019   INR 1.97 11/09/2018   HGBA1C 4.9 09/01/2019      PT/INR: No results for input(s): LABPROT, INR in the last 72 hours. Radiology: Dg Chest Port 1 View  Result Date: 09/05/2019 CLINICAL DATA:  Respiratory status, chest tube. EXAM: PORTABLE CHEST 1 VIEW COMPARISON:  09/04/2019 FINDINGS: Right IJ central venous catheter unchanged. Lungs are adequately inflated demonstrate no change in a moderate size left effusion likely with associated basilar atelectasis. No change small right effusion with associated basilar atelectasis. Mild hazy prominence of the perihilar vessels suggesting a component of interstitial edema. Remainder of the exam is unchanged. IMPRESSION: Findings suggesting mild interstitial edema with stable moderate size left effusion and stable  small right effusion with associated bibasilar atelectasis. Infection in the mid to lower lungs is possible. Electronically Signed   By: Marin Olp M.D.   On: 09/05/2019 09:06     Assessment/Plan: S/P Procedure(s) (LRB): VIDEO ASSISTED THORACOSCOPY/ DRAINAGE OF PERICARDIAL EFFUSION/ PERICARDIAL WINDOW/ ABORTED PERICARDIOCENTESIS  (Right) Stable following drainage of pericardial effusion Surgically stable, please call for further assistance Will need to monitor moderate left pleural effusion    Grace Isaac 09/05/2019 9:42 AM

## 2019-09-06 DIAGNOSIS — I951 Orthostatic hypotension: Secondary | ICD-10-CM

## 2019-09-06 LAB — CBC WITH DIFFERENTIAL/PLATELET
Abs Immature Granulocytes: 0.15 10*3/uL — ABNORMAL HIGH (ref 0.00–0.07)
Basophils Absolute: 0.1 10*3/uL (ref 0.0–0.1)
Basophils Relative: 0 %
Eosinophils Absolute: 0.3 10*3/uL (ref 0.0–0.5)
Eosinophils Relative: 2 %
HCT: 30.2 % — ABNORMAL LOW (ref 36.0–46.0)
Hemoglobin: 9.8 g/dL — ABNORMAL LOW (ref 12.0–15.0)
Immature Granulocytes: 1 %
Lymphocytes Relative: 12 %
Lymphs Abs: 1.8 10*3/uL (ref 0.7–4.0)
MCH: 32.8 pg (ref 26.0–34.0)
MCHC: 32.5 g/dL (ref 30.0–36.0)
MCV: 101 fL — ABNORMAL HIGH (ref 80.0–100.0)
Monocytes Absolute: 1.6 10*3/uL — ABNORMAL HIGH (ref 0.1–1.0)
Monocytes Relative: 11 %
Neutro Abs: 11.4 10*3/uL — ABNORMAL HIGH (ref 1.7–7.7)
Neutrophils Relative %: 74 %
Platelets: 355 10*3/uL (ref 150–400)
RBC: 2.99 MIL/uL — ABNORMAL LOW (ref 3.87–5.11)
RDW: 15.5 % (ref 11.5–15.5)
WBC: 15.4 10*3/uL — ABNORMAL HIGH (ref 4.0–10.5)
nRBC: 0 % (ref 0.0–0.2)

## 2019-09-06 LAB — RENAL FUNCTION PANEL
Albumin: 2.2 g/dL — ABNORMAL LOW (ref 3.5–5.0)
Anion gap: 11 (ref 5–15)
BUN: 42 mg/dL — ABNORMAL HIGH (ref 8–23)
CO2: 25 mmol/L (ref 22–32)
Calcium: 7.4 mg/dL — ABNORMAL LOW (ref 8.9–10.3)
Chloride: 102 mmol/L (ref 98–111)
Creatinine, Ser: 10.41 mg/dL — ABNORMAL HIGH (ref 0.44–1.00)
GFR calc Af Amer: 4 mL/min — ABNORMAL LOW (ref >60)
GFR calc non Af Amer: 4 mL/min — ABNORMAL LOW (ref >60)
Glucose, Bld: 90 mg/dL (ref 70–99)
Phosphorus: 2.5 mg/dL (ref 2.5–4.6)
Potassium: 5 mmol/L (ref 3.5–5.1)
Sodium: 138 mmol/L (ref 135–145)

## 2019-09-06 LAB — GLUCOSE, CAPILLARY
Glucose-Capillary: 73 mg/dL (ref 70–99)
Glucose-Capillary: 123 mg/dL — ABNORMAL HIGH (ref 70–99)

## 2019-09-06 LAB — MAGNESIUM: Magnesium: 1.8 mg/dL (ref 1.7–2.4)

## 2019-09-06 MED ORDER — HEPARIN SODIUM (PORCINE) 1000 UNIT/ML IJ SOLN
INTRAMUSCULAR | Status: AC
  Administered 2019-09-06: 2500 [IU] via INTRAVENOUS_CENTRAL
  Filled 2019-09-06: qty 4

## 2019-09-06 MED ORDER — HEPARIN SODIUM (PORCINE) 1000 UNIT/ML IJ SOLN
INTRAMUSCULAR | Status: AC
  Filled 2019-09-06: qty 1

## 2019-09-06 MED ORDER — HEPARIN SODIUM (PORCINE) 1000 UNIT/ML DIALYSIS
2500.0000 [IU] | INTRAMUSCULAR | Status: AC | PRN
  Administered 2019-09-06 (×2): 2500 [IU] via INTRAVENOUS_CENTRAL
  Filled 2019-09-06 (×2): qty 2.5

## 2019-09-06 MED ORDER — HEPARIN SODIUM (PORCINE) 1000 UNIT/ML IJ SOLN
INTRAMUSCULAR | Status: AC
  Administered 2019-09-06: 2500 [IU] via INTRAVENOUS_CENTRAL
  Filled 2019-09-06: qty 2

## 2019-09-06 NOTE — Progress Notes (Signed)
FYI;Paged in house fellow cards. Patient assisted back from bathroom, became very short of breath. On telemetry patient had a run of wide QRS HR went up to 192 nonsustained. Patient is in bed resting at this time. Stable. HR 82-NSR- 02 sat 100% on room air.

## 2019-09-06 NOTE — Progress Notes (Signed)
[facility]  PROGRESS NOTE    Brenda Arnold  [id number] DOB: 05/26/1956 DOA: 08/25/2019 PCP: Kelton Pillar, MD   Brief Narrative:   63 year old female with ESRD, OSA, pAF who presented on 9/25 with hypotension in setting of hemodialysis and transferred to ICU on 9/29 for cardiogenic shock. PCCM consulted for cardiogenic shock secondary to tamponade.  10/2: C/o pain at and around CT insertion site; c/o constipation 10/3: Says she did better ON. Had BM. Generally in better spirit today. 10/4: Denies complaints today. Continuing to wean pressors. Drains now out.  10/5: In good mood today. Says she is feeling better. No acute events ON.   Assessment & Plan:   Principal Problem:   Chest pain Active Problems:   Type 2 diabetes mellitus with complication, without long-term current use of insulin (HCC)   OBESITY   OBESITY-MORBID (>100')   OSA (obstructive sleep apnea)   Hypotension   ESRD on hemodialysis (HCC)   CAD (coronary artery disease)   Tachycardia   Pericarditis   Cardiac tamponade   Cardiac Tamponade - presenting with atypical chest pain and hypotension - found to be in cardiac tamponade w/ pericardial effusions - now s/p pericardial window. CT management per CTS     - drain now removed  Acute on chronic hypotension - continue midodrine     - off pressors  Bilateral pleural effusions likely related to pulmonary edema in setting of volume overload - Patient on 2L nasal cannula, stable - CT now removed     - wean O2 as able     - repeat CXR: Findings suggesting mild interstitial edema with stable moderate size left effusion and stable small right effusion with associated bibasilar atelectasis. Infection in the mid to lower lungs is possible.     - now working with IS     - she's afebrile, WBC is still rising, but she's asymptomatic (on RA during interview)     - can repeat CXR in AM  Pericarditis - continue colchicine  PAF, AFRVR  - Amiodarone - per cards: Continue amiodarone for rhythm control. Amio 400 mg BID x 1 week (thru 10/3) and then 200 mg BID for 1 week then 200 mg daily - Holding eliquis per CTS; will be able to resume soon - eliquis resumed; continue  ESRD - MWF dialysis per Nephrology  Constipation - colace, miralax - continue colace; miralax d/c'd     - BM ON, resolved  CTS has s/o'd. Pt is off pressors. aSx today. Now off to HD. Can move to progressive status. Will have CIR eval.   DVT prophylaxis: eliquis Code Status: FULL Disposition Plan: TBD  Consultants:   Cardiology  TCTS  Nephrology   ROS:  Denies dyspnea, CR, HA, N, V, dizziness . Remainder 10-pt ROS is negative for all not previously mentioned.  Subjective: "I walked all the way around yesterday."  Objective: Vitals:   09/06/19 1259 09/06/19 1300 09/06/19 1330 09/06/19 1400  BP: (!) 133/57 122/60 123/63 130/65  Pulse: 80 80 74 71  Resp: 20 (!) 22 12 (!) 21  Temp:      TempSrc:      SpO2:   100% 100%  Weight:      Height:        Intake/Output Summary (Last 24 hours) at 09/06/2019 1430 Last data filed at 09/05/2019 2100 Gross per 24 hour  Intake 120 ml  Output -  Net 120 ml   Filed Weights   09/03/19 1842 09/04/19  0530 09/06/19 1250  Weight: 104 kg 105 kg 106.5 kg    Examination:  General: 63 y.o. female resting in bed in NAD Cardiovascular: RRR, +S1, S2, no m/g/r, equal pulses throughout Respiratory:  no w/r/r, normal WOB; decreased at bases GI: BS+, NDNT, obese, no masses noted, no organomegaly noted MSK: No e/c/c; TED noted Skin: No rashes, bruises, ulcerations noted Neuro: A&O x 3, no focal deficits Psyc: Appropriate interaction and affect, calm/cooperative   Data Reviewed: I have personally reviewed following labs and imaging studies.  CBC: Recent Labs  Lab 09/02/19 0219 09/03/19 1047 09/04/19 0422 09/05/19 0906 09/06/19 0321  WBC 11.9* 11.2* 12.7* 13.9*  15.4*  NEUTROABS  --  8.7* 9.4* 10.5* 11.4*  HGB 9.2* 9.4* 9.7* 9.8* 9.8*  HCT 29.7* 29.6* 30.4* 30.4* 30.2*  MCV 102.1* 101.7* 101.0* 102.7* 101.0*  PLT 332 356 350 320 [id number]   Basic Metabolic Panel: Recent Labs  Lab 08/31/19 1123  09/02/19 0219 09/03/19 1047 09/04/19 0422 09/05/19 0906 09/06/19 0321 09/06/19 0322  NA 138   < > 136 136 137 137  --  138  K 5.3*   < > 4.1 4.1 4.0 4.0  --  5.0  CL 97*   < > 100 99 98 100  --  102  CO2 27   < > 28 26 29 25   --  25  GLUCOSE 118*   < > 115* 110* 90 98  --  90  BUN 37*   < > 18 30* 19 31*  --  42*  CREATININE 9.54*   < > 5.88* 8.74* 6.81* 9.05*  --  10.41*  CALCIUM 8.7*   < > 8.0* 8.1* 8.2* 8.3*  --  7.4*  MG 2.1  --   --  1.9 1.9 2.0 1.8  --   PHOS 3.5  --  2.9 3.3 2.4* 3.0  --  2.5   < > = values in this interval not displayed.   GFR: Estimated Creatinine Clearance: 6.9 mL/min (A) (by C-G formula based on SCr of 10.41 mg/dL (H)). Liver Function Tests: Recent Labs  Lab 09/02/19 0219 09/03/19 1047 09/04/19 0422 09/05/19 0906 09/06/19 0322  ALBUMIN 2.6* 2.4* 2.3* 2.2* 2.2*   No results for input(s): LIPASE, AMYLASE in the last 168 hours. No results for input(s): AMMONIA in the last 168 hours. Coagulation Profile: No results for input(s): INR, PROTIME in the last 168 hours. Cardiac Enzymes: No results for input(s): CKTOTAL, CKMB, CKMBINDEX, TROPONINI in the last 168 hours. BNP (last 3 results) No results for input(s): PROBNP in the last 8760 hours. HbA1C: No results for input(s): HGBA1C in the last 72 hours. CBG: Recent Labs  Lab 09/05/19 0651 09/05/19 1139 09/05/19 1525 09/05/19 2116 09/06/19 0702  GLUCAP 78 91 123* 101* 73   Lipid Profile: No results for input(s): CHOL, HDL, LDLCALC, TRIG, CHOLHDL, LDLDIRECT in the last 72 hours. Thyroid Function Tests: No results for input(s): TSH, T4TOTAL, FREET4, T3FREE, THYROIDAB in the last 72 hours. Anemia Panel: Recent Labs    09/03/19 2106  TIBC 151*  IRON 30    Sepsis Labs: No results for input(s): PROCALCITON, LATICACIDVEN in the last 168 hours.  Recent Results (from the past 240 hour(s))  Surgical PCR screen     Status: None   Collection Time: 08/31/19 11:26 AM   Specimen: Nasal Swab  Result Value Ref Range Status   MRSA, PCR NEGATIVE NEGATIVE Final   Staphylococcus aureus NEGATIVE NEGATIVE Final    Comment: (NOTE) The  Xpert SA Assay (FDA approved for NASAL specimens in patients 38 years of age and older), is one component of a comprehensive surveillance program. It is not intended to diagnose infection nor to guide or monitor treatment. Performed at [facility], [place] [address], [place], [place] [address]   Aerobic/Anaerobic Culture (surgical/deep wound)     Status: None   Collection Time: 08/31/19  2:17 PM   Specimen: PATH Cytology Misc. fluid; Body Fluid  Result Value Ref Range Status   Specimen Description PERICARDIAL  Final   Special Requests NONE  Final   Gram Stain   Final    MODERATE WBC PRESENT,BOTH PMN AND MONONUCLEAR NO ORGANISMS SEEN    Culture   Final    No growth aerobically or anaerobically. Performed at [facility], [place] [address] [place], [place] [address]    Report Status 09/05/2019 FINAL  Final  Aerobic/Anaerobic Culture (surgical/deep wound)     Status: None   Collection Time: 08/31/19  2:29 PM   Specimen: Pericardial; Tissue  Result Value Ref Range Status   Specimen Description PERICARDIAL  Final   Special Requests NONE  Final   Gram Stain   Final    FEW WBC PRESENT, PREDOMINANTLY MONONUCLEAR NO ORGANISMS SEEN    Culture   Final    No growth aerobically or anaerobically. Performed at [facility], [place] [address], [place], [place] [address]    Report Status 09/05/2019 FINAL  Final      Radiology Studies: Dg Chest Port 1 View  Result Date: 09/05/2019 CLINICAL DATA:  Respiratory status, chest tube. EXAM: PORTABLE CHEST 1 VIEW COMPARISON:  09/04/2019 FINDINGS: Right IJ central  venous catheter unchanged. Lungs are adequately inflated demonstrate no change in a moderate size left effusion likely with associated basilar atelectasis. No change small right effusion with associated basilar atelectasis. Mild hazy prominence of the perihilar vessels suggesting a component of interstitial edema. Remainder of the exam is unchanged. IMPRESSION: Findings suggesting mild interstitial edema with stable moderate size left effusion and stable small right effusion with associated bibasilar atelectasis. Infection in the mid to lower lungs is possible. Electronically Signed   By: Marin Olp M.D.   On: 09/05/2019 09:06     Scheduled Meds: . amiodarone  200 mg Oral BID   Followed by  . [START ON 09/12/2019] amiodarone  200 mg Oral Daily  . apixaban  5 mg Oral BID  . calcitRIOL  0.75 mcg Oral Q M,W,F-HD  . Chlorhexidine Gluconate Cloth  6 each Topical Q0600  . colchicine  0.3 mg Oral Q M,W,F-HD  . [START ON 09/10/2019] darbepoetin (ARANESP) injection - NON-DIALYSIS  60 mcg Subcutaneous Q Fri-1800  . docusate sodium  100 mg Oral BID  . gabapentin  100 mg Oral QHS  . insulin aspart  0-9 Units Subcutaneous TID WC  . mouth rinse  15 mL Mouth Rinse BID  . midodrine  10 mg Oral TID WC  . multivitamin  1 tablet Oral QHS  . sucroferric oxyhydroxide  1,000 mg Oral TID WC & HS   Continuous Infusions: . amiodarone    . phenylephrine (NEO-SYNEPHRINE) Adult infusion 10 mcg/min (09/05/19 0200)     LOS: 11 days    Time spent: 25 minutes spent in the coordination of care today.    Jonnie Finner, DO [facility] Pager [phone]  If 7PM-7AM, please contact night-coverage [email or web address] Password [email or web address] 09/06/2019, 2:30 PM

## 2019-09-06 NOTE — Progress Notes (Signed)
CT surgery p.m. Rounds  Blood pressure (!) 102/52, pulse 92, temperature 97.9 F (36.6 C), temperature source Oral, resp. rate (!) 22, height 5\' 6"  (1.676 m), weight 104 kg, SpO2 97 %.  Tolerated hemodialysis today Minimal discomfort at surgical incision

## 2019-09-06 NOTE — Progress Notes (Signed)
[facility] Progress Note   Assessment/ Plan:   Dialysis: [facility] MWF   4h 200Re   450/800   99kg   2/2 bath  Hep 10000  L AVF Parsabiv 15 Calcitriol 0.75 Recent OP Labs: Hgb 12.2 K 4.7 Ca 8.3 Phos 3.9 PTH 1960  Assessment/Plan: 1. Pericardial effusion/ tamponade/ acute pericarditis - EKG with diffuse ST elevation. Echo showed LVEF of 60-65%, normal RV function.  CP improved but hypotension worsened and pt taken for pericardial window on 08/31/19. BP's improved w/ drainage of effusion. Per Card surg and cardiology. Drain out 10/3, repeat echo with small effusion and no evidence of tamponade. On colchicine MWF x 3 months, recommend getting a full CBC with plts at least twice monthly on HD to ensure no agranulocytosis.    2. Hypertension/volume:chronic BP issue on midodrine. Up 5 kg by wt's, mild edema on exam. Rewrote for fluid restriction. UF 2-3 kg next HD--> will need to probe for EDW  3. ESRD:HD MWF. Next HD today, Monday Was on eliquis at home and heparin 10K with HD.  Will give prn hep at 50% outside dose, 2500 x 2 prn.  Given effusion- will need to ensure accurate EDW achieved and will probe for EDW.  Also may need to re-evaluate OP prescription, pt initially had a run time of 4:30, may need to add back 15 min given tamponade.  4. L AVF aneurysms: evaluated by Dr. Donnetta Hutching, reports it is safe to use access and to avoid areas of superficial blistering  5. Atrial fib: Converted to NSR. Transitioning to po amio. Eliquis held for 3 days, now has been resumed.   6. Anemia ckd + abl: Hb low 9's, will start on darbe 60 ug 1st dose today 10/3, then weekly on Friday.   7. Metabolic bone disease: Continue bindersand calcitriol. On Parasbivfor 2HPTH- not availablein hospital. Resume at discharge.  Subjective:    For HD today.  Pt reports CP resolved.  All drains out.   Objective:   BP (!) 110/59 (BP Location: Right Arm)   Pulse 81   Temp 98.1 F (36.7 C) (Oral)    Resp 19   Ht 5\' 6"  (1.676 m)   Wt 105 kg   SpO2 100%   BMI 37.36 kg/m   Physical Exam: Gen: NAD, lying in bed CVS: RRR no rub heard Resp: clear anteriorly no m/r/g Abd: soft, obese, NABS Ext: no LE edema ACCESS: L FA AVF, aneurysmal  Labs: BMET Recent Labs  Lab 08/31/19 1123 09/01/19 0357 09/02/19 0219 09/03/19 1047 09/04/19 0422 09/05/19 0906 09/06/19 0322  NA 138 137 136 136 137 137 138  K 5.3* 5.1 4.1 4.1 4.0 4.0 5.0  CL 97* 100 100 99 98 100 102  CO2 27 24 28 26 29 25 25   GLUCOSE 118* 100* 115* 110* 90 98 90  BUN 37* 44* 18 30* 19 31* 42*  CREATININE 9.54* 10.38* 5.88* 8.74* 6.81* 9.05* 10.41*  CALCIUM 8.7* 8.5* 8.0* 8.1* 8.2* 8.3* 7.4*  PHOS 3.5  --  2.9 3.3 2.4* 3.0 2.5   CBC Recent Labs  Lab 09/03/19 1047 09/04/19 0422 09/05/19 0906 09/06/19 0321  WBC 11.2* 12.7* 13.9* 15.4*  NEUTROABS 8.7* 9.4* 10.5* 11.4*  HGB 9.4* 9.7* 9.8* 9.8*  HCT 29.6* 30.4* 30.4* 30.2*  MCV 101.7* 101.0* 102.7* 101.0*  PLT 356 350 320 355    @IMGRELPRIORS @ Medications:    . amiodarone  200 mg Oral BID   Followed by  . [START ON  09/12/2019] amiodarone  200 mg Oral Daily  . apixaban  5 mg Oral BID  . calcitRIOL  0.75 mcg Oral Q M,W,F-HD  . Chlorhexidine Gluconate Cloth  6 each Topical Q0600  . colchicine  0.3 mg Oral Q M,W,F-HD  . [START ON 09/10/2019] darbepoetin (ARANESP) injection - NON-DIALYSIS  60 mcg Subcutaneous Q Fri-1800  . docusate sodium  100 mg Oral BID  . gabapentin  100 mg Oral QHS  . insulin aspart  0-9 Units Subcutaneous TID WC  . mouth rinse  15 mL Mouth Rinse BID  . midodrine  10 mg Oral TID WC  . multivitamin  1 tablet Oral QHS  . sucroferric oxyhydroxide  1,000 mg Oral TID WC & HS     Madelon Lips MD 09/06/2019, 10:05 AM

## 2019-09-06 NOTE — Progress Notes (Signed)
Progress Note  Patient Name: Brenda Arnold Date of Encounter: 09/06/2019  Primary Cardiologist: Fransico Him, MD   Subjective   Good spirits today - plan for dialysis. Maintaining sinus rhythm.  Inpatient Medications    Scheduled Meds: . amiodarone  200 mg Oral BID   Followed by  . [START ON 09/12/2019] amiodarone  200 mg Oral Daily  . apixaban  5 mg Oral BID  . atorvastatin  40 mg Oral q1800  . calcitRIOL  0.75 mcg Oral Q M,W,F-HD  . Chlorhexidine Gluconate Cloth  6 each Topical Q0600  . colchicine  0.3 mg Oral Q M,W,F-HD  . [START ON 09/10/2019] darbepoetin (ARANESP) injection - NON-DIALYSIS  60 mcg Subcutaneous Q Fri-1800  . docusate sodium  100 mg Oral BID  . gabapentin  100 mg Oral QHS  . insulin aspart  0-9 Units Subcutaneous TID WC  . mouth rinse  15 mL Mouth Rinse BID  . midodrine  10 mg Oral TID WC  . multivitamin  1 tablet Oral QHS  . sucroferric oxyhydroxide  1,000 mg Oral TID WC & HS   Continuous Infusions: . amiodarone    . phenylephrine (NEO-SYNEPHRINE) Adult infusion 10 mcg/min (09/05/19 0200)   PRN Meds: acetaminophen **OR** acetaminophen, HYDROmorphone (DILAUDID) injection, menthol-cetylpyridinium, ondansetron (ZOFRAN) IV, senna-docusate, traMADol   Vital Signs    Vitals:   09/06/19 0400 09/06/19 0500 09/06/19 0600 09/06/19 0700  BP: (!) 116/50 (!) 113/56 (!) 103/52 (!) 110/59  Pulse: 76 75 76 81  Resp: 17 16 14 19   Temp: 98.9 F (37.2 C)   98.1 F (36.7 C)  TempSrc: Oral   Oral  SpO2: 96% 91% 92% 100%  Weight:      Height:        Intake/Output Summary (Last 24 hours) at 09/06/2019 0859 Last data filed at 09/05/2019 2100 Gross per 24 hour  Intake 431.6 ml  Output -  Net 431.6 ml   Last 3 Weights 09/04/2019 09/03/2019 09/03/2019  Weight (lbs) 231 lb 7.7 oz 229 lb 4.5 oz 234 lb 9.1 oz  Weight (kg) 105 kg 104 kg 106.4 kg      Telemetry    Sinus rhythm.  7 beats of NSVT - Personally Reviewed  ECG     09/04/19: Atrial fibrillation.   Rate 138 bpm.  LAD. - Personally Reviewed  Physical Exam   VS:  BP (!) 110/59 (BP Location: Right Arm)   Pulse 81   Temp 98.1 F (36.7 C) (Oral)   Resp 19   Ht 5\' 6"  (1.676 m)   Wt 105 kg   SpO2 100%   BMI 37.36 kg/m  , BMI Body mass index is 37.36 kg/m. GENERAL:  Chronically ill-appearing.  No acute distress. HEENT: Pupils equal round and reactive, fundi not visualized, oral mucosa unremarkable NECK:  No jugular venous distention, waveform within normal limits, carotid upstroke brisk and symmetric, no bruits LUNGS:  Clear to auscultation bilaterally HEART:  RRR.  PMI not displaced or sustained,S1 and S2 within normal limits, no S3, no S4, no clicks, no rubs, no murmurs ABD:  Flat, positive bowel sounds normal in frequency in pitch, no bruits, no rebound, no guarding, no midline pulsatile mass, no hepatomegaly, no splenomegaly EXT:  2 plus pulses throughout, no edema, no cyanosis no clubbing SKIN:  No rashes no nodules NEURO:  Cranial nerves II through XII grossly intact, motor grossly intact throughout PSYCH:  Cognitively intact, oriented to person place and time   Labs  High Sensitivity Troponin:   Recent Labs  Lab 08/24/19 1747 08/24/19 2022 08/25/19 0931 08/25/19 1124  TROPONINIHS 7 8 12 10       Chemistry Recent Labs  Lab 08/30/19 1250  09/04/19 0422 09/05/19 0906 09/06/19 0322  NA  --    < > 137 137 138  K  --    < > 4.0 4.0 5.0  CL  --    < > 98 100 102  CO2  --    < > 29 25 25   GLUCOSE  --    < > 90 98 90  BUN  --    < > 19 31* 42*  CREATININE  --    < > 6.81* 9.05* 10.41*  CALCIUM  --    < > 8.2* 8.3* 7.4*  PROT 7.1  --   --   --   --   ALBUMIN 3.1*   < > 2.3* 2.2* 2.2*  AST 34  --   --   --   --   ALT 43  --   --   --   --   ALKPHOS 133*  --   --   --   --   BILITOT 0.4  --   --   --   --   GFRNONAA  --    < > 6* 4* 4*  GFRAA  --    < > 7* 5* 4*  ANIONGAP  --    < > 10 12 11    < > = values in this interval not displayed.     Hematology  Recent Labs  Lab 09/04/19 0422 09/05/19 0906 09/06/19 0321  WBC 12.7* 13.9* 15.4*  RBC 3.01* 2.96* 2.99*  HGB 9.7* 9.8* 9.8*  HCT 30.4* 30.4* 30.2*  MCV 101.0* 102.7* 101.0*  MCH 32.2 33.1 32.8  MCHC 31.9 32.2 32.5  RDW 15.0 15.7* 15.5  PLT 350 320 355    BNPNo results for input(s): BNP, PROBNP in the last 168 hours.   DDimer No results for input(s): DDIMER in the last 168 hours.   Radiology    Dg Chest Port 1 View  Result Date: 09/05/2019 CLINICAL DATA:  Respiratory status, chest tube. EXAM: PORTABLE CHEST 1 VIEW COMPARISON:  09/04/2019 FINDINGS: Right IJ central venous catheter unchanged. Lungs are adequately inflated demonstrate no change in a moderate size left effusion likely with associated basilar atelectasis. No change small right effusion with associated basilar atelectasis. Mild hazy prominence of the perihilar vessels suggesting a component of interstitial edema. Remainder of the exam is unchanged. IMPRESSION: Findings suggesting mild interstitial edema with stable moderate size left effusion and stable small right effusion with associated bibasilar atelectasis. Infection in the mid to lower lungs is possible. Electronically Signed   By: Marin Olp M.D.   On: 09/05/2019 09:06    Cardiac Studies   Limited echo 08/31/19: 1. Left ventricular ejection fraction, by visual estimation, is 55 to 60%. The left ventricle has normal function. There is no left ventricular hypertrophy. 2. Global right ventricle was not well visualized.The right ventricular size is not well visualized. Right vetricular wall thickness was not assessed. 3. Left atrial size was normal. 4. Right atrial size was not well visualized. 5. Large pericardial effusion. 6. The mitral valve was not assessed. Not assessed mitral valve regurgitation. 7. The tricuspid valve is not assessed. Tricuspid valve regurgitation was not assessed by color flow Doppler. 8. The aortic valve was not assessed Aortic  valve regurgitation was  not assessed by color flow Doppler. 9. The pulmonic valve was not well visualized. Pulmonic valve regurgitation was not assessed by color flow Doppler. 10. Aortic root could not be assessed. 11. Limited study for pericardial effusion; large pericardial effusion; no RV diastolic collapse; IVC dilated; cannot R/O early tamponade; echo is larger compared to 08/28/19. 12. The interatrial septum was not assessed.  Limited Echo 08/28/19 1. Left ventricular ejection fraction, by visual estimation, is 60 to 65%. The left ventricle has normal function. Normal left ventricular size. There is no left ventricular hypertrophy. 2. Global right ventricle has normal systolic function.The right ventricular size is normal. No increase in right ventricular wall thickness. 3. Left atrial size was normal. 4. Right atrial size was normal. 5. Moderate pericardial effusion. 6. The pericardial effusion is circumferential. 7. The mitral valve is normal in structure. No evidence of mitral valve regurgitation. No evidence of mitral stenosis. 8. The tricuspid valve is normal in structure. Tricuspid valve regurgitation is trivial. 9. The aortic valve is normal in structure. Aortic valve regurgitation was not visualized by color flow Doppler. Structurally normal aortic valve, with no evidence of sclerosis or stenosis. 10. The pulmonic valve was normal in structure. Pulmonic valve regurgitation is not visualized by color flow Doppler. 11. Normal pulmonary artery systolic pressure. 12. The inferior vena cava is normal in size with greater than 50% respiratory variability, suggesting right atrial pressure of 3 mmHg. 13. Compared to 08/26/2019, the pericardial effusion appears larger, but there is no evidence of tamponade.  Echo 08/26/19 1. Left ventricular ejection fraction, by visual estimation, is 60 to 65%. The left ventricle has normal function. Normal left ventricular size. There is mildly  increased left ventricular hypertrophy. 2. Left ventricular diastolic Doppler parameters are consistent with impaired relaxation pattern of LV diastolic filling. 3. The aortic valve is tricuspid Aortic valve regurgitation was not visualized by color flow Doppler. Mild aortic valve sclerosis without stenosis. 4. Left atrial size was normal. 5. Right atrial size was normal. 6. Global right ventricle has normal systolic function.The right ventricular size is normal. No increase in right ventricular wall thickness. 7. Trivial pericardial effusion is present. 8. The tricuspid valve is normal in structure. Tricuspid valve regurgitation was not visualized by color flow Doppler. 9. The mitral valve is normal in structure. No evidence of mitral valve regurgitation. No evidence of mitral stenosis. 10. TR signal is inadequate for assessing pulmonary artery systolic pressure. 11. The inferior vena cava is normal in size with greater than 50% respiratory variability, suggesting right atrial pressure of 3 mmHg.  Echo 10/2: LVEF 65-70%.  RV poorly visualized.  Small pericardial effusion, significantly improved from prior.  Moderate pleural effusion.  No tamponade.  Patient Profile     63 y.o. female with end-stage renal disease on HD, paroxysmal atrial fibrillation, diabetes, hypertension, OSA, and obesity admitted with chest pain.  She was found to have pericardial effusion and tamponade.  She underwent pericardial window with Dr. Kipp Brood on 9/29.  Assessment & Plan   1. Cardiac tamponade/Pericardial effusion: Repeat echo showed a small effusion improved from prior and no tamponade.  Pericardial drain removed 10/3. She is back on Eliquis and H/H stable.  Continue colchicine for 3 months.  Given that she is on Eliquis we will not do high-dose aspirin.  No NSAIDS 2/2 ESRD. Continue midodrine - MAP 72 today.  2. Paroxysmal atrial fibrillation:  Currently remains in sinus rhythm. Converting  amiodarone to po. Restarted Eliquis 10/3.  3. ESRD on HD:  Per nephrology.  Continue midodrine and weaning phenylephrine for BP support.  4. Hyperlipidemia:  Statin provides no mortality benefit in ESRD patients - she has refused in the past as well. Will d/c.  5. Deconditioning: PT has evaluated -recommending CIR.  TIME SPENT WITH PATIENT: 35 minutes of direct patient care. More than 50% of that time was spent on coordination of care and counseling regarding pericardial effusion, PAF, hyperlipidemia.    Pixie Casino, MD, [facility], [place] Director of the [facility] Lipid [facility]  Card[facility] Diplomate of the American Board of Clinical Lipidology Attending Cardiologist  Direct Dial: [phone]  Fax: [phone]  Website:  [email or web address]  Pixie Casino, MD  09/06/2019, 8:59 AM

## 2019-09-06 NOTE — Progress Notes (Signed)
Occupational Therapy Treatment Patient Details Name: Brenda Arnold MRN: [id number] DOB: Sep 24, 1956 Today's Date: 09/06/2019    History of present illness 63 year old lady with prior h/o type 2 DM, hypotension, on midodrine, ESRD on HD, CAD, OSA presents to ED FOR chest pain. Chest pain possibly pericarditis. Recovery complicated by hypotension now s/p R VATS, pericardial window and R chest tube insertion on 08/31/19.   OT comments  Patient progressing slowly.  Completing transfers with min assist using RW, toileting and grooming with min guard.  Requires increased time and rest breaks.  At rest on RA SpO2 high 90s, with activity tends to desat but poor waveform and difficult to read--recovers quickly.  Encouraged PLB throughout session.  Will follow, CIR remains appropriate.    Follow Up Recommendations  CIR;Supervision/Assistance - 24 hour    Equipment Recommendations  3 in 1 bedside commode    Recommendations for Other Services      Precautions / Restrictions Precautions Precautions: Fall Restrictions Weight Bearing Restrictions: No       Mobility Bed Mobility Overal bed mobility: Needs Assistance Bed Mobility: Supine to Sit     Supine to sit: Min guard     General bed mobility comments: HOB elevated, increased time and effort, steadying assist only  Transfers Overall transfer level: Needs assistance Equipment used: Rolling walker (2 wheeled) Transfers: Sit to/from Stand Sit to Stand: Min assist         General transfer comment: min assist for safety/balance, cueing for hand placement     Balance Overall balance assessment: Needs assistance Sitting-balance support: Feet supported Sitting balance-Leahy Scale: Fair     Standing balance support: Bilateral upper extremity supported;During functional activity;Single extremity supported Standing balance-Leahy Scale: Fair Standing balance comment: pt reliant on BUE support dynamically, able to stand during ADls  with 0-1 support but preference to B support                           ADL either performed or assessed with clinical judgement   ADL Overall ADL's : Needs assistance/impaired     Grooming: Wash/dry hands;Min guard;Standing                   Toilet Transfer: Ambulation;RW;BSC;Regular Comptroller Details (indicate cue type and reason): 3:1 over commode Toileting- Clothing Manipulation and Hygiene: Min guard;Sit to/from stand       Functional mobility during ADLs: Rolling walker;Minimal assistance General ADL Comments: pt limited by generalized weakness and decreased activity tolerance     Vision       Perception     Praxis      Cognition Arousal/Alertness: Awake/alert Behavior During Therapy: WFL for tasks assessed/performed Overall Cognitive Status: Within Functional Limits for tasks assessed                                          Exercises     Shoulder Instructions       General Comments pt on RA during session, at rest upper 90's but tends to desat during activites (but also poor wave form)    Pertinent Vitals/ Pain       Pain Assessment: Faces Faces Pain Scale: Hurts a little bit Pain Location: Right incision Pain Intervention(s): Monitored during session;Repositioned  Home Living  Prior Functioning/Environment              Frequency  Min 2X/week        Progress Toward Goals  OT Goals(current goals can now be found in the care plan section)  Progress towards OT goals: Progressing toward goals  Acute Rehab OT Goals Patient Stated Goal: to return to independent OT Goal Formulation: With patient  Plan Discharge plan remains appropriate;Frequency remains appropriate    Co-evaluation                 AM-PAC OT "6 Clicks" Daily Activity     Outcome Measure   Help from another person eating meals?: None Help  from another person taking care of personal grooming?: A Little Help from another person toileting, which includes using toliet, bedpan, or urinal?: A Little Help from another person bathing (including washing, rinsing, drying)?: A Lot Help from another person to put on and taking off regular upper body clothing?: A Little Help from another person to put on and taking off regular lower body clothing?: A Lot 6 Click Score: 17    End of Session Equipment Utilized During Treatment: Rolling walker  OT Visit Diagnosis: Unsteadiness on feet (R26.81);Other abnormalities of gait and mobility (R26.89);Pain;Muscle weakness (generalized) (M62.81) Pain - part of body: (R incision)   Activity Tolerance Patient tolerated treatment well   Patient Left in chair;with call bell/phone within reach;Other (comment)(PT in room)   Nurse Communication Mobility status        Time: 1129-1150 OT Time Calculation (min): 21 min  Charges: OT General Charges $OT Visit: 1 Visit OT Treatments $Self Care/Home Management : 8-22 mins  Delight Stare, [place] Pager [phone] Office [phone]    Delight Stare 09/06/2019, 1:20 PM

## 2019-09-06 NOTE — Progress Notes (Signed)
Physical Therapy Treatment Patient Details Name: Brenda Arnold MRN: [id number] DOB: 10/19/1956 Today's Date: 09/06/2019    History of Present Illness 63 year old lady with prior h/o type 2 DM, hypotension, on midodrine, ESRD on HD, CAD, OSA presents to ED FOR chest pain. Chest pain possibly pericarditis. Recovery complicated by hypotension now s/p R VATS, pericardial window and R chest tube insertion on 08/31/19.    PT Comments    Initially in bathroom with OT.  Sats difficult to get during activity with poor waveform, HR in the upper 90's and low 100's, dyspnea 2/4.  Emphasis on transitions and gait stability/stamina.  Again sats during gait difficult to read on cold/painted fingernails.  In sitting at end of activity, sats 92/93% with EHR 115 bpm.  Gait with EVA walker for approx 100 feet.    Follow Up Recommendations  CIR     Equipment Recommendations  None recommended by PT    Recommendations for Other Services       Precautions / Restrictions Precautions Precautions: Fall    Mobility  Bed Mobility Overal bed mobility: Needs Assistance             General bed mobility comments: OOB with OT on arrival  Transfers Overall transfer level: Needs assistance Equipment used: None Transfers: Sit to/from Stand Sit to Stand: Min assist         General transfer comment: occ cues for hand placement, minor stability asssit  Ambulation/Gait Ambulation/Gait assistance: Min guard Gait Distance (Feet): 100 Feet Assistive device: (EVA walker) Gait Pattern/deviations: Step-through pattern Gait velocity: slower Gait velocity interpretation: <1.8 ft/sec, indicate of risk for recurrent falls General Gait Details: wide BOS, mild instability, kept in check by EVA walker.  Sats difficult to get during gait, but 02% on RA within 30-40 secs of sitting.  EHR 115 bpm with gait.  Dyspnea 1-2/4   Stairs             Wheelchair Mobility    Modified Rankin (Stroke Patients  Only)       Balance Overall balance assessment: Needs assistance Sitting-balance support: Feet supported Sitting balance-Leahy Scale: Fair     Standing balance support: Bilateral upper extremity supported;No upper extremity supported Standing balance-Leahy Scale: Fair Standing balance comment: statically fair, but needs UE support during gait                            Cognition Arousal/Alertness: Awake/alert Behavior During Therapy: WFL for tasks assessed/performed Overall Cognitive Status: Within Functional Limits for tasks assessed                                        Exercises      General Comments General comments (skin integrity, edema, etc.): Sats on RA at rest in the upper 90's, with gait it is difficult to get a reliable reading until activity completed and readings quickly reach 92/93% on RA.  VSS      Pertinent Vitals/Pain Pain Assessment: Faces Faces Pain Scale: Hurts a little bit Pain Location: Right incision Pain Intervention(s): Monitored during session    Home Living                      Prior Function            PT Goals (current goals can now be found in  the care plan section) Acute Rehab PT Goals Patient Stated Goal: to return to independent PT Goal Formulation: With patient Time For Goal Achievement: 09/15/19 Potential to Achieve Goals: Good Progress towards PT goals: Progressing toward goals    Frequency    Min 3X/week      PT Plan Current plan remains appropriate    Co-evaluation              AM-PAC PT "6 Clicks" Mobility   Outcome Measure  Help needed turning from your back to your side while in a flat bed without using bedrails?: A Little Help needed moving from lying on your back to sitting on the side of a flat bed without using bedrails?: A Little Help needed moving to and from a bed to a chair (including a wheelchair)?: A Little Help needed standing up from a chair using your  arms (e.g., wheelchair or bedside chair)?: A Little Help needed to walk in hospital room?: A Little Help needed climbing 3-5 steps with a railing? : A Little 6 Click Score: 18    End of Session   Activity Tolerance: Patient limited by fatigue Patient left: in chair;with call bell/phone within reach Nurse Communication: Mobility status PT Visit Diagnosis: Other abnormalities of gait and mobility (R26.89);Muscle weakness (generalized) (M62.81);Pain Pain - Right/Left: Right     Time: [id number] PT Time Calculation (min) (ACUTE ONLY): 25 min  Charges:  $Gait Training: 8-22 mins $Therapeutic Activity: 8-22 mins                     09/06/2019  Donnella Sham, PT Acute Rehabilitation Services [phone]  (pager) [phone]  (office)   Tessie Fass Chestine Belknap 09/06/2019, 12:26 PM

## 2019-09-07 ENCOUNTER — Inpatient Hospital Stay (HOSPITAL_COMMUNITY): Payer: BC Managed Care – PPO

## 2019-09-07 DIAGNOSIS — I318 Other specified diseases of pericardium: Secondary | ICD-10-CM

## 2019-09-07 LAB — CBC WITH DIFFERENTIAL/PLATELET
Abs Immature Granulocytes: 0.19 10*3/uL — ABNORMAL HIGH (ref 0.00–0.07)
Basophils Absolute: 0.1 10*3/uL (ref 0.0–0.1)
Basophils Relative: 0 %
Eosinophils Absolute: 0.2 10*3/uL (ref 0.0–0.5)
Eosinophils Relative: 2 %
HCT: 29.2 % — ABNORMAL LOW (ref 36.0–46.0)
Hemoglobin: 9 g/dL — ABNORMAL LOW (ref 12.0–15.0)
Immature Granulocytes: 1 %
Lymphocytes Relative: 13 %
Lymphs Abs: 1.8 10*3/uL (ref 0.7–4.0)
MCH: 31.4 pg (ref 26.0–34.0)
MCHC: 30.8 g/dL (ref 30.0–36.0)
MCV: 101.7 fL — ABNORMAL HIGH (ref 80.0–100.0)
Monocytes Absolute: 1.7 10*3/uL — ABNORMAL HIGH (ref 0.1–1.0)
Monocytes Relative: 13 %
Neutro Abs: 9.7 10*3/uL — ABNORMAL HIGH (ref 1.7–7.7)
Neutrophils Relative %: 71 %
Platelets: 298 10*3/uL (ref 150–400)
RBC: 2.87 MIL/uL — ABNORMAL LOW (ref 3.87–5.11)
RDW: 15.9 % — ABNORMAL HIGH (ref 11.5–15.5)
WBC: 13.7 10*3/uL — ABNORMAL HIGH (ref 4.0–10.5)
nRBC: 0 % (ref 0.0–0.2)

## 2019-09-07 LAB — RENAL FUNCTION PANEL
Albumin: 2.2 g/dL — ABNORMAL LOW (ref 3.5–5.0)
Anion gap: 11 (ref 5–15)
BUN: 22 mg/dL (ref 8–23)
CO2: 26 mmol/L (ref 22–32)
Calcium: 8.3 mg/dL — ABNORMAL LOW (ref 8.9–10.3)
Chloride: 101 mmol/L (ref 98–111)
Creatinine, Ser: 7.19 mg/dL — ABNORMAL HIGH (ref 0.44–1.00)
GFR calc Af Amer: 6 mL/min — ABNORMAL LOW (ref >60)
GFR calc non Af Amer: 6 mL/min — ABNORMAL LOW (ref >60)
Glucose, Bld: 85 mg/dL (ref 70–99)
Phosphorus: 2 mg/dL — ABNORMAL LOW (ref 2.5–4.6)
Potassium: 3.8 mmol/L (ref 3.5–5.1)
Sodium: 138 mmol/L (ref 135–145)

## 2019-09-07 LAB — GLUCOSE, CAPILLARY
Glucose-Capillary: 88 mg/dL (ref 70–99)
Glucose-Capillary: 85 mg/dL (ref 70–99)
Glucose-Capillary: 93 mg/dL (ref 70–99)
Glucose-Capillary: 98 mg/dL (ref 70–99)

## 2019-09-07 LAB — MAGNESIUM: Magnesium: 2 mg/dL (ref 1.7–2.4)

## 2019-09-07 NOTE — Consult Note (Addendum)
Inpatient Rehabilitation Admissions Coordinator  Inpatient rehab consult received. I met with patient at bedside for rehab assessment. We discussed goals and expectations of an inpt rehab admit and pt prefers CIR rather than SNF. Her Mom can come once she is discharged home to assist as needed. Pt works full time remotely from home and is independent and driving herself to hemodialysis 3 times per week. I feel she is an excellent candidate for an inpt rehab admission. Please clarify when felt she would be medically ready for d/c so that I can begin insurance authorization with [employer] for a possible admit pending their approval.  Danne Baxter, RN, MSN Rehab Admissions Coordinator [phone] 09/07/2019 1:28 PM

## 2019-09-07 NOTE — Progress Notes (Addendum)
Progress Note  Patient Name: Brenda Arnold Date of Encounter: 09/07/2019  Primary Cardiologist: Fransico Him, MD   Subjective   No complaints -noted to have wide complex tachycardia O/N, looks like NSVT <15 beats - now in sinus. On oral amiodarone. Leukocytosis improving.   Inpatient Medications    Scheduled Meds: . amiodarone  200 mg Oral BID   Followed by  . [START ON 09/12/2019] amiodarone  200 mg Oral Daily  . apixaban  5 mg Oral BID  . calcitRIOL  0.75 mcg Oral Q M,W,F-HD  . Chlorhexidine Gluconate Cloth  6 each Topical Q0600  . colchicine  0.3 mg Oral Q M,W,F-HD  . [START ON 09/10/2019] darbepoetin (ARANESP) injection - NON-DIALYSIS  60 mcg Subcutaneous Q Fri-1800  . docusate sodium  100 mg Oral BID  . gabapentin  100 mg Oral QHS  . insulin aspart  0-9 Units Subcutaneous TID WC  . mouth rinse  15 mL Mouth Rinse BID  . midodrine  10 mg Oral TID WC  . multivitamin  1 tablet Oral QHS  . sucroferric oxyhydroxide  1,000 mg Oral TID WC & HS   Continuous Infusions: . amiodarone    . phenylephrine (NEO-SYNEPHRINE) Adult infusion 10 mcg/min (09/05/19 0200)   PRN Meds: acetaminophen **OR** acetaminophen, HYDROmorphone (DILAUDID) injection, menthol-cetylpyridinium, ondansetron (ZOFRAN) IV, senna-docusate, traMADol   Vital Signs    Vitals:   09/06/19 2348 09/07/19 0339 09/07/19 0757 09/07/19 0809  BP: (!) 96/55 (!) 117/57  (!) 95/51  Pulse: 83 89 89   Resp: (!) 21 17 19    Temp: 98.3 F (36.8 C) 98.1 F (36.7 C) 97.6 F (36.4 C)   TempSrc: Oral Oral Oral   SpO2: 100% 99% 100%   Weight:      Height:        Intake/Output Summary (Last 24 hours) at 09/07/2019 0911 Last data filed at 09/06/2019 2200 Gross per 24 hour  Intake 240 ml  Output 3000 ml  Net -2760 ml   Last 3 Weights 09/06/2019 09/06/2019 09/06/2019  Weight (lbs) 231 lb 14.8 oz 229 lb 4.5 oz 234 lb 12.6 oz  Weight (kg) 105.2 kg 104 kg 106.5 kg      Telemetry    Sinus rhythm with up to 15 beats  NSVT - Personally Reviewed  ECG    N/A  Physical Exam   VS:  BP (!) 95/51   Pulse 89   Temp 97.6 F (36.4 C) (Oral)   Resp 19   Ht 5\' 6"  (1.676 m)   Wt 105.2 kg   SpO2 100%   BMI 37.43 kg/m  , BMI Body mass index is 37.43 kg/m.  General appearance: alert and no distress Neck: no carotid bruit, no JVD and thyroid not enlarged, symmetric, no tenderness/mass/nodules Lungs: diminished breath sounds bibasilar Heart: regular rate and rhythm Abdomen: soft, non-tender; bowel sounds normal; no masses,  no organomegaly and obese Extremities: extremities normal, atraumatic, no cyanosis or edema Pulses: 2+ and symmetric Skin: Skin color, texture, turgor normal. No rashes or lesions Neurologic: Grossly normal Psych: Pleasant   Labs    High Sensitivity Troponin:   Recent Labs  Lab 08/24/19 1747 08/24/19 2022 08/25/19 0931 08/25/19 1124  TROPONINIHS 7 8 12 10       Chemistry Recent Labs  Lab 09/05/19 0906 09/06/19 0322 09/07/19 0404  NA 137 138 138  K 4.0 5.0 3.8  CL 100 102 101  CO2 25 25 26   GLUCOSE 98 90 85  BUN 31*  42* 22  CREATININE 9.05* 10.41* 7.19*  CALCIUM 8.3* 7.4* 8.3*  ALBUMIN 2.2* 2.2* 2.2*  GFRNONAA 4* 4* 6*  GFRAA 5* 4* 6*  ANIONGAP 12 11 11      Hematology Recent Labs  Lab 09/05/19 0906 09/06/19 0321 09/07/19 0404  WBC 13.9* 15.4* 13.7*  RBC 2.96* 2.99* 2.87*  HGB 9.8* 9.8* 9.0*  HCT 30.4* 30.2* 29.2*  MCV 102.7* 101.0* 101.7*  MCH 33.1 32.8 31.4  MCHC 32.2 32.5 30.8  RDW 15.7* 15.5 15.9*  PLT 320 355 298    BNPNo results for input(s): BNP, PROBNP in the last 168 hours.   DDimer No results for input(s): DDIMER in the last 168 hours.   Radiology    No results found.  Cardiac Studies   Limited echo 08/31/19: 1. Left ventricular ejection fraction, by visual estimation, is 55 to 60%. The left ventricle has normal function. There is no left ventricular hypertrophy. 2. Global right ventricle was not well visualized.The right  ventricular size is not well visualized. Right vetricular wall thickness was not assessed. 3. Left atrial size was normal. 4. Right atrial size was not well visualized. 5. Large pericardial effusion. 6. The mitral valve was not assessed. Not assessed mitral valve regurgitation. 7. The tricuspid valve is not assessed. Tricuspid valve regurgitation was not assessed by color flow Doppler. 8. The aortic valve was not assessed Aortic valve regurgitation was not assessed by color flow Doppler. 9. The pulmonic valve was not well visualized. Pulmonic valve regurgitation was not assessed by color flow Doppler. 10. Aortic root could not be assessed. 11. Limited study for pericardial effusion; large pericardial effusion; no RV diastolic collapse; IVC dilated; cannot R/O early tamponade; echo is larger compared to 08/28/19. 12. The interatrial septum was not assessed.  Limited Echo 08/28/19 1. Left ventricular ejection fraction, by visual estimation, is 60 to 65%. The left ventricle has normal function. Normal left ventricular size. There is no left ventricular hypertrophy. 2. Global right ventricle has normal systolic function.The right ventricular size is normal. No increase in right ventricular wall thickness. 3. Left atrial size was normal. 4. Right atrial size was normal. 5. Moderate pericardial effusion. 6. The pericardial effusion is circumferential. 7. The mitral valve is normal in structure. No evidence of mitral valve regurgitation. No evidence of mitral stenosis. 8. The tricuspid valve is normal in structure. Tricuspid valve regurgitation is trivial. 9. The aortic valve is normal in structure. Aortic valve regurgitation was not visualized by color flow Doppler. Structurally normal aortic valve, with no evidence of sclerosis or stenosis. 10. The pulmonic valve was normal in structure. Pulmonic valve regurgitation is not visualized by color flow Doppler. 11. Normal pulmonary artery  systolic pressure. 12. The inferior vena cava is normal in size with greater than 50% respiratory variability, suggesting right atrial pressure of 3 mmHg. 13. Compared to 08/26/2019, the pericardial effusion appears larger, but there is no evidence of tamponade.  Echo 08/26/19 1. Left ventricular ejection fraction, by visual estimation, is 60 to 65%. The left ventricle has normal function. Normal left ventricular size. There is mildly increased left ventricular hypertrophy. 2. Left ventricular diastolic Doppler parameters are consistent with impaired relaxation pattern of LV diastolic filling. 3. The aortic valve is tricuspid Aortic valve regurgitation was not visualized by color flow Doppler. Mild aortic valve sclerosis without stenosis. 4. Left atrial size was normal. 5. Right atrial size was normal. 6. Global right ventricle has normal systolic function.The right ventricular size is normal. No increase in  right ventricular wall thickness. 7. Trivial pericardial effusion is present. 8. The tricuspid valve is normal in structure. Tricuspid valve regurgitation was not visualized by color flow Doppler. 9. The mitral valve is normal in structure. No evidence of mitral valve regurgitation. No evidence of mitral stenosis. 10. TR signal is inadequate for assessing pulmonary artery systolic pressure. 11. The inferior vena cava is normal in size with greater than 50% respiratory variability, suggesting right atrial pressure of 3 mmHg.  Echo 10/2: LVEF 65-70%.  RV poorly visualized.  Small pericardial effusion, significantly improved from prior.  Moderate pleural effusion.  No tamponade.  Patient Profile     63 y.o. female with end-stage renal disease on HD, paroxysmal atrial fibrillation, diabetes, hypertension, OSA, and obesity admitted with chest pain.  She was found to have pericardial effusion and tamponade.  She underwent pericardial window with Dr. Kipp Brood on 9/29.  Assessment & Plan    1. Cardiac tamponade/Pericardial effusion: Repeat echo showed a small effusion improved from prior and no tamponade.  Pericardial drain removed 10/3. She is back on Eliquis and H/H stable.  Continue colchicine for 3 months.  Given that she is on Eliquis we will not do high-dose aspirin.  No NSAIDS 2/2 ESRD. Continue midodrine - MAP 72 today.  2. Paroxysmal atrial fibrillation:  Currently remains in sinus rhythm. Converting amiodarone to po. Restarted Eliquis 10/3.  3. ESRD on HD: Per nephrology.  Continue midodrine.  4. Hyperlipidemia:  Statin provides no mortality benefit in ESRD patients - she has refused in the past as well. Will d/c.  5. Deconditioning: PT has evaluated -recommending CIR.  6. NSVT: she has had PVC's and had NSVT overnight- on amiodarone. BP will not support BB, requiring midodrine  TIME SPENT WITH PATIENT: 25 minutes of direct patient care. More than 50% of that time was spent on coordination of care and counseling regarding pericardial effusion, PAF, hyperlipidemia.    Pixie Casino, MD, [facility], [place] Director of the [facility]  [facility] Diplomate of the American Board of Clinical Lipidology Attending Cardiologist  Direct Dial: [phone]  Fax: [phone]  Website:  [email or web address]  Pixie Casino, MD  09/07/2019, 9:11 AM

## 2019-09-07 NOTE — Plan of Care (Signed)

## 2019-09-07 NOTE — Progress Notes (Addendum)
[facility] Progress Note   Assessment/ Plan:   Dialysis: [facility] MWF   4h 200Re   450/800   99kg   2/2 bath  Hep 10000  L AVF Parsabiv 15 Calcitriol 0.75 Recent OP Labs: Hgb 12.2 K 4.7 Ca 8.3 Phos 3.9 PTH 1960  Assessment/Plan: 1. Pericardial effusion/ tamponade/ acute pericarditis - EKG with diffuse ST elevation. Echo showed LVEF of 60-65%, normal RV function.  CP improved but hypotension worsened and pt taken for pericardial window on 08/31/19. BP's improved w/ drainage of effusion. Per Card surg and cardiology. Drain out 10/3, repeat echo with small effusion and no evidence of tamponade. On colchicine MWF x 3 months, recommend getting a full CBC with plts at least twice monthly on HD to ensure no agranulocytosis.    2. Hypertension/volume:chronic BP issue on midodrine. Up 5 kg by wt's, mild edema on exam. Rewrote for fluid restriction. UF 2-3 kg next HD--> will need to probe for EDW  3. ESRD:HD MWF. Next HD today, Monday Was on eliquis at home and heparin 10K with HD.  Will give prn hep at 50% outside dose, 2500 x 2 prn.  Given effusion- will need to ensure accurate EDW achieved and will probe for EDW.  Also may need to re-evaluate OP prescription, pt initially had a run time of 4:30, may need to add back 15 min.  4. L AVF aneurysms: evaluated by Dr. Donnetta Hutching, reports it is safe to use access and to avoid areas of superficial blistering  5. Atrial fib: Converted to NSR. Transitioning to po amio. Eliquis held for 3 days, now has been resumed.   6. Anemia ckd + abl: Hb low 9's, will start on darbe 60 ug 1st dose today 10/3, then weekly on Friday.   7. Metabolic bone disease: Continue bindersand calcitriol. On Parasbivfor 2HPTH- not availablein hospital. Resume at discharge.  8. NSVT: per cardiology, no BB d/t requiring midodrine. Already on 3K bath.  9. Dispo: posisbly to CIR  Subjective:    Tolerated HD well yesterday, NSVT overnight noted.     Objective:   BP  (!) 95/51   Pulse 89   Temp 97.6 F (36.4 C) (Oral)   Resp 19   Ht 5\' 6"  (1.676 m)   Wt 105.2 kg   SpO2 100%   BMI 37.43 kg/m   Physical Exam: Gen: NAD, lying in bed CVS: RRR no rub heard Resp: clear anteriorly no m/r/g Abd: soft, obese, NABS Ext: no LE edema ACCESS: L FA AVF, aneurysmal  Labs: BMET Recent Labs  Lab 08/31/19 1123 09/01/19 0357 09/02/19 0219 09/03/19 1047 09/04/19 0422 09/05/19 0906 09/06/19 0322 09/07/19 0404  NA 138 137 136 136 137 137 138 138  K 5.3* 5.1 4.1 4.1 4.0 4.0 5.0 3.8  CL 97* 100 100 99 98 100 102 101  CO2 27 24 28 26 29 25 25 26   GLUCOSE 118* 100* 115* 110* 90 98 90 85  BUN 37* 44* 18 30* 19 31* 42* 22  CREATININE 9.54* 10.38* 5.88* 8.74* 6.81* 9.05* 10.41* 7.19*  CALCIUM 8.7* 8.5* 8.0* 8.1* 8.2* 8.3* 7.4* 8.3*  PHOS 3.5  --  2.9 3.3 2.4* 3.0 2.5 2.0*   CBC Recent Labs  Lab 09/04/19 0422 09/05/19 0906 09/06/19 0321 09/07/19 0404  WBC 12.7* 13.9* 15.4* 13.7*  NEUTROABS 9.4* 10.5* 11.4* 9.7*  HGB 9.7* 9.8* 9.8* 9.0*  HCT 30.4* 30.4* 30.2* 29.2*  MCV 101.0* 102.7* 101.0* 101.7*  PLT 350 320 355 298    @  IMGRELPRIORS@ Medications:    . amiodarone  200 mg Oral BID   Followed by  . [START ON 09/12/2019] amiodarone  200 mg Oral Daily  . apixaban  5 mg Oral BID  . calcitRIOL  0.75 mcg Oral Q M,W,F-HD  . Chlorhexidine Gluconate Cloth  6 each Topical Q0600  . colchicine  0.3 mg Oral Q M,W,F-HD  . [START ON 09/10/2019] darbepoetin (ARANESP) injection - NON-DIALYSIS  60 mcg Subcutaneous Q Fri-1800  . docusate sodium  100 mg Oral BID  . gabapentin  100 mg Oral QHS  . insulin aspart  0-9 Units Subcutaneous TID WC  . mouth rinse  15 mL Mouth Rinse BID  . midodrine  10 mg Oral TID WC  . multivitamin  1 tablet Oral QHS  . sucroferric oxyhydroxide  1,000 mg Oral TID WC & HS     Madelon Lips MD 09/07/2019, 10:27 AM

## 2019-09-07 NOTE — Progress Notes (Addendum)
Patient ambulated a short ways in hall way. Very SOB on exertion. HR increased from 80 to 110s while ambulating. Saturations remained at 98-100%. Became SOB while sitting up on side of bed. Saturations remained at 98-99% on room air. HR slowly recovering to low 100s.  Worked on Financial planner, 500-750 5x. Educated patient on frequent breaks during activities. Denies light headedness or dizziness. Just said that her getting out of bed is exhausting.  2230- Patient has been sitting up in chair for approx 30 minutes. HR 85, NSR, o2sat 99% on room air. Will continue to observe.

## 2019-09-07 NOTE — Progress Notes (Signed)
Brenda Arnold  PROGRESS NOTE    SENA DUNFEE  [id number] DOB: 1956/03/23 DOA: 08/25/2019 PCP: Kelton Pillar, MD   Brief Narrative:   63 year old female with ESRD, OSA, pAF who presented on 9/25 with hypotension in setting of hemodialysis and transferred to ICU on 9/29 for cardiogenic shock. PCCM consulted for cardiogenic shock secondary to tamponade.  10/2: C/o pain at and around CT insertion site; c/o constipation 10/3: Says she did better ON. Had BM. Generally in better spirit today. 10/4: Denies complaints today. Continuing to wean pressors. Drains now out. 10/5: In good mood today. Says she is feeling better. No acute events ON. 10/6: Tolerated HD yesterday. Denies complaints ON. Ok for TTF to med-tele   Assessment & Plan:   Principal Problem:   Chest pain Active Problems:   Type 2 diabetes mellitus with complication, without long-term current use of insulin (HCC)   OBESITY   OBESITY-MORBID (>100')   OSA (obstructive sleep apnea)   Hypotension   ESRD on hemodialysis (HCC)   CAD (coronary artery disease)   Tachycardia   Pericarditis   Cardiac tamponade   Cardiac Tamponade - presenting with atypical chest pain and hypotension - found to be in cardiac tamponade w/ pericardial effusions - now s/p pericardial window. CT management per CTS - drain now removed     - resolved  Acute on chronic hypotension - continue midodrine     - off pressors  Bilateral pleural effusions likely related to pulmonary edema in setting of volume overload - Patient on 2L nasal cannula, stable - CTnow removed - wean O2 as able - repeat CXR: Findings suggesting mild interstitial edema with stable moderate size left effusion and stable small right effusion with associated bibasilar atelectasis. Infection in the mid to lower lungs is possible. - now working with IS - afebrile, WBC improved     - CXR results noted     - she reports breathing  ok; monitor   Pericarditis - continue colchicine  PAF, AFRVR - Amiodarone - per cards: Continue amiodarone for rhythm control. Amio 400 mg BID x 1 week (thru 10/3) and then 200 mg BID for 1 week then 200 mg daily - Holding eliquis per CTS; will be able to resume soon -continue eliquis  ESRD - MWF dialysis per Nephrology  Constipation - colace, miralax -continue colace; miralax d/c'd     - BM ON, resolved  Deconditioning Debility     - CIR consult  DVT prophylaxis: eliquis Code Status: FULL   Disposition Plan: TBD  Consultants:   Cardiology  TCTS  Nephrology  ROS:  Denies CP, N, V, dyspnea. Remainder 10-pt ROS is negative for all not previously mentioned.  Subjective: "I think I'm doing well."  Objective: Vitals:   09/06/19 2348 09/07/19 0339 09/07/19 0757 09/07/19 0809  BP: (!) 96/55 (!) 117/57  (!) 95/51  Pulse: 83 89 89   Resp: (!) 21 17 19    Temp: 98.3 F (36.8 C) 98.1 F (36.7 C) 97.6 F (36.4 C)   TempSrc: Oral Oral Oral   SpO2: 100% 99% 100%   Weight:      Height:        Intake/Output Summary (Last 24 hours) at 09/07/2019 1322 Last data filed at 09/06/2019 2200 Gross per 24 hour  Intake 240 ml  Output 3000 ml  Net -2760 ml   Filed Weights   09/06/19 1250 09/06/19 1705 09/06/19 1953  Weight: 106.5 kg 104 kg 105.2 kg    Examination:  General:63 y.o.femaleresting in bed in NAD Cardiovascular: RRR, +S1, S2, no m/g/r, equal pulses throughout Respiratory:  CTABL, no w/r/r, normal WOB GI: BS+, NDNT,obese,no masses noted, no organomegaly noted MSK: No e/c/c; TED noted Skin: No rashes, bruises, ulcerations noted Neuro: A&O x 3, no focal deficits Psyc: Appropriate interaction and affect, calm/cooperative   Data Reviewed: I have personally reviewed following labs and imaging studies.  CBC: Recent Labs  Lab 09/03/19 1047 09/04/19 0422 09/05/19 0906 09/06/19 0321 09/07/19 0404  WBC 11.2*  12.7* 13.9* 15.4* 13.7*  NEUTROABS 8.7* 9.4* 10.5* 11.4* 9.7*  HGB 9.4* 9.7* 9.8* 9.8* 9.0*  HCT 29.6* 30.4* 30.4* 30.2* 29.2*  MCV 101.7* 101.0* 102.7* 101.0* 101.7*  PLT 356 350 320 355 [id number]   Basic Metabolic Panel: Recent Labs  Lab 09/03/19 1047 09/04/19 0422 09/05/19 0906 09/06/19 0321 09/06/19 0322 09/07/19 0404  NA 136 137 137  --  138 138  K 4.1 4.0 4.0  --  5.0 3.8  CL 99 98 100  --  102 101  CO2 26 29 25   --  25 26  GLUCOSE 110* 90 98  --  90 85  BUN 30* 19 31*  --  42* 22  CREATININE 8.74* 6.81* 9.05*  --  10.41* 7.19*  CALCIUM 8.1* 8.2* 8.3*  --  7.4* 8.3*  MG 1.9 1.9 2.0 1.8  --  2.0  PHOS 3.3 2.4* 3.0  --  2.5 2.0*   GFR: Estimated Creatinine Clearance: 10 mL/min (A) (by C-G formula based on SCr of 7.19 mg/dL (H)). Liver Function Tests: Recent Labs  Lab 09/03/19 1047 09/04/19 0422 09/05/19 0906 09/06/19 0322 09/07/19 0404  ALBUMIN 2.4* 2.3* 2.2* 2.2* 2.2*   No results for input(s): LIPASE, AMYLASE in the last 168 hours. No results for input(s): AMMONIA in the last 168 hours. Coagulation Profile: No results for input(s): INR, PROTIME in the last 168 hours. Cardiac Enzymes: No results for input(s): CKTOTAL, CKMB, CKMBINDEX, TROPONINI in the last 168 hours. BNP (last 3 results) No results for input(s): PROBNP in the last 8760 hours. HbA1C: No results for input(s): HGBA1C in the last 72 hours. CBG: Recent Labs  Lab 09/05/19 2116 09/06/19 0702 09/06/19 2158 09/07/19 0656 09/07/19 1128  GLUCAP 101* 73 123* 88 85   Lipid Profile: No results for input(s): CHOL, HDL, LDLCALC, TRIG, CHOLHDL, LDLDIRECT in the last 72 hours. Thyroid Function Tests: No results for input(s): TSH, T4TOTAL, FREET4, T3FREE, THYROIDAB in the last 72 hours. Anemia Panel: No results for input(s): VITAMINB12, FOLATE, FERRITIN, TIBC, IRON, RETICCTPCT in the last 72 hours. Sepsis Labs: No results for input(s): PROCALCITON, LATICACIDVEN in the last 168 hours.  Recent Results  (from the past 240 hour(s))  Surgical PCR screen     Status: None   Collection Time: 08/31/19 11:26 AM   Specimen: Nasal Swab  Result Value Ref Range Status   MRSA, PCR NEGATIVE NEGATIVE Final   Staphylococcus aureus NEGATIVE NEGATIVE Final    Comment: (NOTE) The Xpert SA Assay (FDA approved for NASAL specimens in patients 65 years of age and older), is one component of a comprehensive surveillance program. It is not intended to diagnose infection nor to guide or monitor treatment. Performed at [facility], [place] [address], [place], [place] [address]   Aerobic/Anaerobic Culture (surgical/deep wound)     Status: None   Collection Time: 08/31/19  2:17 PM   Specimen: PATH Cytology Misc. fluid; Body Fluid  Result Value Ref Range Status   Specimen Description  PERICARDIAL  Final   Special Requests NONE  Final   Gram Stain   Final    MODERATE WBC PRESENT,BOTH PMN AND MONONUCLEAR NO ORGANISMS SEEN    Culture   Final    No growth aerobically or anaerobically. Performed at [facility], [place] [address], [place], [place] [address]    Report Status 09/05/2019 FINAL  Final  Aerobic/Anaerobic Culture (surgical/deep wound)     Status: None   Collection Time: 08/31/19  2:29 PM   Specimen: Pericardial; Tissue  Result Value Ref Range Status   Specimen Description PERICARDIAL  Final   Special Requests NONE  Final   Gram Stain   Final    FEW WBC PRESENT, PREDOMINANTLY MONONUCLEAR NO ORGANISMS SEEN    Culture   Final    No growth aerobically or anaerobically. Performed at [facility], [place] [address] [place],  [address]    Report Status 09/05/2019 FINAL  Final      Radiology Studies: Dg Chest Port 1 View  Result Date: 09/07/2019 CLINICAL DATA:  Chest tube removed 2 days ago, some shortness of breath EXAM: PORTABLE CHEST 1 VIEW COMPARISON:  Portable exam 0640 hours compared to 09/05/2019 FINDINGS: RIGHT jugular line with tip projecting over SVC. LEFT heart border  obscured by moderate-sized LEFT pleural effusion and basilar atelectasis. Small RIGHT pleural effusion and basilar atelectasis. Perihilar vascular congestion and peribronchial thickening present with probable perihilar edema. Atherosclerotic calcification aorta. No pneumothorax. IMPRESSION: BILATERAL pleural effusions and basilar atelectasis, moderate LEFT and small RIGHT. Enlargement of cardiac silhouette with pulmonary vascular congestion and probable mild pulmonary edema. Electronically Signed   By: Lavonia Dana M.D.   On: 09/07/2019 09:39     Scheduled Meds: . amiodarone  200 mg Oral BID   Followed by  . [START ON 09/12/2019] amiodarone  200 mg Oral Daily  . apixaban  5 mg Oral BID  . calcitRIOL  0.75 mcg Oral Q M,W,F-HD  . Chlorhexidine Gluconate Cloth  6 each Topical Q0600  . colchicine  0.3 mg Oral Q M,W,F-HD  . [START ON 09/10/2019] darbepoetin (ARANESP) injection - NON-DIALYSIS  60 mcg Subcutaneous Q Fri-1800  . docusate sodium  100 mg Oral BID  . gabapentin  100 mg Oral QHS  . insulin aspart  0-9 Units Subcutaneous TID WC  . mouth rinse  15 mL Mouth Rinse BID  . midodrine  10 mg Oral TID WC  . multivitamin  1 tablet Oral QHS  . sucroferric oxyhydroxide  1,000 mg Oral TID WC & HS   Continuous Infusions: . amiodarone    . phenylephrine (NEO-SYNEPHRINE) Adult infusion 10 mcg/min (09/05/19 0200)     LOS: 12 days    Time spent: 25 minutes spent in the coordination of care today.    Jonnie Finner, DO [facility] Pager [phone]  If 7PM-7AM, please contact night-coverage [email or web address] Password [email or web address] 09/07/2019, 1:22 PM

## 2019-09-08 DIAGNOSIS — R0789 Other chest pain: Secondary | ICD-10-CM

## 2019-09-08 DIAGNOSIS — R000 Tachycardia, unspecified: Secondary | ICD-10-CM

## 2019-09-08 LAB — CBC
HCT: 29.5 % — ABNORMAL LOW (ref 36.0–46.0)
Hemoglobin: 9.3 g/dL — ABNORMAL LOW (ref 12.0–15.0)
MCH: 32.1 pg (ref 26.0–34.0)
MCHC: 31.5 g/dL (ref 30.0–36.0)
MCV: 101.7 fL — ABNORMAL HIGH (ref 80.0–100.0)
Platelets: 342 10*3/uL (ref 150–400)
RBC: 2.9 MIL/uL — ABNORMAL LOW (ref 3.87–5.11)
RDW: 16.2 % — ABNORMAL HIGH (ref 11.5–15.5)
WBC: 14.9 10*3/uL — ABNORMAL HIGH (ref 4.0–10.5)
nRBC: 0 % (ref 0.0–0.2)

## 2019-09-08 LAB — BASIC METABOLIC PANEL
Anion gap: 9 (ref 5–15)
BUN: 36 mg/dL — ABNORMAL HIGH (ref 8–23)
CO2: 27 mmol/L (ref 22–32)
Calcium: 8.4 mg/dL — ABNORMAL LOW (ref 8.9–10.3)
Chloride: 100 mmol/L (ref 98–111)
Creatinine, Ser: 9.77 mg/dL — ABNORMAL HIGH (ref 0.44–1.00)
GFR calc Af Amer: 4 mL/min — ABNORMAL LOW (ref >60)
GFR calc non Af Amer: 4 mL/min — ABNORMAL LOW (ref >60)
Glucose, Bld: 121 mg/dL — ABNORMAL HIGH (ref 70–99)
Potassium: 3.8 mmol/L (ref 3.5–5.1)
Sodium: 136 mmol/L (ref 135–145)

## 2019-09-08 LAB — GLUCOSE, CAPILLARY
Glucose-Capillary: 81 mg/dL (ref 70–99)
Glucose-Capillary: 88 mg/dL (ref 70–99)
Glucose-Capillary: 123 mg/dL — ABNORMAL HIGH (ref 70–99)

## 2019-09-08 MED ORDER — CALCITRIOL 0.25 MCG PO CAPS
ORAL_CAPSULE | ORAL | Status: AC
  Filled 2019-09-08: qty 1

## 2019-09-08 MED ORDER — CALCITRIOL 0.5 MCG PO CAPS
ORAL_CAPSULE | ORAL | Status: AC
  Filled 2019-09-08: qty 1

## 2019-09-08 MED ORDER — MIDODRINE HCL 5 MG PO TABS
ORAL_TABLET | ORAL | Status: AC
  Filled 2019-09-08: qty 2

## 2019-09-08 NOTE — Progress Notes (Signed)
PT Cancellation Note  Patient Details Name: SAIGE GREENLEES MRN: [id number] DOB: [birth year]   Cancelled Treatment:    Reason Eval/Treat Not Completed: Patient at procedure or test/unavailable, to HD. Check back another time.   Claretha Cooper 09/08/2019, 3:23 PM  [place] Pager [phone] Office [phone]

## 2019-09-08 NOTE — Progress Notes (Addendum)
Inpatient Rehabilitation Admissions Coordinator  Discussed with Dr. Maylene Roes. I will begin insurance approval with [employer] of [place] once I have updated PT and OT treatment notes from today. I have notified assigned PT, Santiago Glad.  Danne Baxter, RN, MSN Rehab Admissions Coordinator [phone] 09/08/2019 12:13 PM

## 2019-09-08 NOTE — Progress Notes (Signed)
[facility] Progress Note   Assessment/ Plan:   Dialysis: [facility] MWF   4h 200Re   450/800   99kg   2/2 bath  Hep 10000  L AVF Parsabiv 15 Calcitriol 0.75 Recent OP Labs: Hgb 12.2 K 4.7 Ca 8.3 Phos 3.9 PTH 1960  Assessment/Plan: 1. Pericardial effusion/ tamponade/ acute pericarditis - EKG with diffuse ST elevation. Echo showed LVEF of 60-65%, normal RV function.  CP improved but hypotension worsened and pt taken for pericardial window on 08/31/19. BP's improved w/ drainage of effusion. Per Card surg and cardiology. Drain out 10/3, repeat echo with small effusion and no evidence of tamponade. On colchicine MWF x 3 months, recommend getting a full CBC with plts at least twice monthly on HD to ensure no agranulocytosis.    2. Hypertension/volume:chronic BP issue on midodrine. Up 5 kg by wt's, mild edema on exam. Rewrote for fluid restriction. UF 2-3 kg next HD--> will need to probe for EDW  3. ESRD:HD MWF. HD on schecdule today 10/7 Was on eliquis at home and heparin 10K with HD.  Will give prn hep at 50% outside dose, 2500 x 2 prn.  Given effusion- will need to ensure accurate EDW achieved and will probe for EDW.  Also may need to re-evaluate OP prescription, pt initially had a run time of 4:30, may need to add back 15 min.  4. L AVF aneurysms: evaluated by Dr. Donnetta Hutching, reports it is safe to use access and to avoid areas of superficial blistering.    5. Atrial fib: Converted to NSR. Transitioning to po amio. Eliquis held for 3 days, now has been resumed.   6. Anemia ckd + abl: Hb low 9's, will start on darbe 60 ug 1st dose today 10/3, then weekly on Friday.   7. Metabolic bone disease: Continue bindersand calcitriol. On Parasbivfor 2HPTH- not availablein hospital. Resume at discharge.  8. NSVT: per cardiology, no BB d/t requiring midodrine. Already on 3K bath.  9. Dispo: CIR- from renal perspective can go anytime   Subjective:    Feeling well today.  No issues.   Hopeful for CIR stay.     Objective:   BP (!) 131/55 (BP Location: Right Arm)   Pulse 96   Temp 98.7 F (37.1 C) (Oral)   Resp 16   Ht 5\' 6"  (1.676 m)   Wt 105.2 kg   SpO2 100%   BMI 37.43 kg/m   Physical Exam: Gen: NAD, lying in bed CVS: RRR no rub heard Resp: clear anteriorly no m/r/g Abd: soft, obese, NABS Ext: no LE edema ACCESS: L FA AVF, aneurysmal  Labs: BMET Recent Labs  Lab 09/02/19 0219 09/03/19 1047 09/04/19 0422 09/05/19 0906 09/06/19 0322 09/07/19 0404 09/08/19 0821  NA 136 136 137 137 138 138 136  K 4.1 4.1 4.0 4.0 5.0 3.8 3.8  CL 100 99 98 100 102 101 100  CO2 28 26 29 25 25 26 27   GLUCOSE 115* 110* 90 98 90 85 121*  BUN 18 30* 19 31* 42* 22 36*  CREATININE 5.88* 8.74* 6.81* 9.05* 10.41* 7.19* 9.77*  CALCIUM 8.0* 8.1* 8.2* 8.3* 7.4* 8.3* 8.4*  PHOS 2.9 3.3 2.4* 3.0 2.5 2.0*  --    CBC Recent Labs  Lab 09/04/19 0422 09/05/19 0906 09/06/19 0321 09/07/19 0404 09/08/19 0821  WBC 12.7* 13.9* 15.4* 13.7* 14.9*  NEUTROABS 9.4* 10.5* 11.4* 9.7*  --   HGB 9.7* 9.8* 9.8* 9.0* 9.3*  HCT 30.4* 30.4* 30.2* 29.2* 29.5*  MCV 101.0* 102.7* 101.0* 101.7* 101.7*  PLT 350 320 355 298 342    @IMGRELPRIORS @ Medications:    . amiodarone  200 mg Oral BID   Followed by  . [START ON 09/12/2019] amiodarone  200 mg Oral Daily  . apixaban  5 mg Oral BID  . calcitRIOL  0.75 mcg Oral Q M,W,F-HD  . Chlorhexidine Gluconate Cloth  6 each Topical Q0600  . colchicine  0.3 mg Oral Q M,W,F-HD  . [START ON 09/10/2019] darbepoetin (ARANESP) injection - NON-DIALYSIS  60 mcg Subcutaneous Q Fri-1800  . docusate sodium  100 mg Oral BID  . gabapentin  100 mg Oral QHS  . insulin aspart  0-9 Units Subcutaneous TID WC  . mouth rinse  15 mL Mouth Rinse BID  . midodrine  10 mg Oral TID WC  . multivitamin  1 tablet Oral QHS  . sucroferric oxyhydroxide  1,000 mg Oral TID WC & HS     Madelon Lips MD 09/08/2019, 10:23 AM

## 2019-09-08 NOTE — Progress Notes (Signed)
PROGRESS NOTE    Brenda Arnold  [id number] DOB: 1955-12-09 DOA: 08/25/2019 PCP: Kelton Pillar, MD     Brief Narrative:  Brenda Arnold is a 63 year old female with ESRD on dialysis MWF, OSA, paroxysmal atrial fibrillation who presented to the hospital on 9/25 with hypotension in setting of dialysis.  Due to cardiogenic shock, she was transferred to ICU on 9/29.  She was found to have cardiac tamponade, underwent pericardial window with drain placement on 9/29.  Drain removed 10/3.   New events last 24 hours / Subjective: Feeling well this morning, no complaints of chest pain or shortness of breath, no nausea or vomiting.  Assessment & Plan:   Principal Problem:   Chest pain Active Problems:   Type 2 diabetes mellitus with complication, without long-term current use of insulin (HCC)   OBESITY   OBESITY-MORBID (>100')   OSA (obstructive sleep apnea)   Hypotension   ESRD on hemodialysis (HCC)   CAD (coronary artery disease)   Tachycardia   Pericarditis   Cardiac tamponade   Cardiac tamponade, pericardial effusion, acute pericarditis -Found to be in cardiac tamponade w/ pericardial effusions, status post pericardial window.  Drain removed 10/3 -Resolved -Continue colchicine MWF for 3 months.  Will need CBC with platelets at least twice monthly on dialysis to ensure no agranulocytosis, per nephrology  -No NSAIDs, aspirin  Acute on chronic hypotension  -Continue midodrine  Bilateral pleural effusions likely related to pulmonary edema in setting of volume overload -Improved  Paroxysmal atrial fibrillation, A. fib RVR -Per cards: Continue amiodarone for rhythm control. Amio 400 mg BID x 1 week (thru 10/3) and then 200 mg BID for 1 week then 200 mg daily -Continue eliquis  ESRD -MWF dialysis per Nephrology  Deconditioning Debility -CIR consult   DVT prophylaxis: Eliquis Code Status: Full code Family Communication: None Disposition Plan: CIR  evaluation and approval pending   Consultants:   PCCM  Cardiology  Cardiothoracic surgery  Nephrology   Antimicrobials:  Anti-infectives (From admission, onward)   Start     Dose/Rate Route Frequency Ordered Stop   09/01/19 1300  ceFAZolin (ANCEF) IVPB 1 g/50 mL premix     1 g 100 mL/hr over 30 Minutes Intravenous Every 24 hr x 2 08/31/19 1650 09/01/19 1802   08/31/19 1245  ceFAZolin (ANCEF) IVPB 2g/100 mL premix     2 g 200 mL/hr over 30 Minutes Intravenous  Once 08/31/19 1238 08/31/19 1325   08/31/19 1239  ceFAZolin (ANCEF) 2-4 GM/100ML-% IVPB    Note to Pharmacy: Henrine Screws   : cabinet override      08/31/19 1239 08/31/19 1315        Objective: Vitals:   09/07/19 2340 09/08/19 0254 09/08/19 0810 09/08/19 1049  BP: 96/76 (!) 127/58 (!) 131/55 104/64  Pulse: 88 83 96 84  Resp: (!) 23 16  18   Temp: 99.1 F (37.3 C) 98.6 F (37 C) 98.7 F (37.1 C) 98.6 F (37 C)  TempSrc: Oral Oral Oral Oral  SpO2: 100% 100% 100% 95%  Weight:      Height:       No intake or output data in the 24 hours ending 09/08/19 1132 Filed Weights   09/06/19 1250 09/06/19 1705 09/06/19 1953  Weight: 106.5 kg 104 kg 105.2 kg    Examination:  General exam: Appears calm and comfortable  Respiratory system: Clear to auscultation. Respiratory effort normal. No respiratory distress. No conversational dyspnea.  Cardiovascular system: S1 & S2 heard, RRR. No  murmurs. No pedal edema. Gastrointestinal system: Abdomen is nondistended, soft and nontender. Normal bowel sounds heard. Central nervous system: Alert and oriented. No focal neurological deficits. Speech clear.  Extremities: Symmetric in appearance  Skin: No rashes, lesions or ulcers on exposed skin  Psychiatry: Judgement and insight appear normal. Mood & affect appropriate.   Data Reviewed: I have personally reviewed following labs and imaging studies  CBC: Recent Labs  Lab 09/03/19 1047 09/04/19 0422 09/05/19 0906 09/06/19  0321 09/07/19 0404 09/08/19 0821  WBC 11.2* 12.7* 13.9* 15.4* 13.7* 14.9*  NEUTROABS 8.7* 9.4* 10.5* 11.4* 9.7*  --   HGB 9.4* 9.7* 9.8* 9.8* 9.0* 9.3*  HCT 29.6* 30.4* 30.4* 30.2* 29.2* 29.5*  MCV 101.7* 101.0* 102.7* 101.0* 101.7* 101.7*  PLT 356 350 320 355 298 [id number]   Basic Metabolic Panel: Recent Labs  Lab 09/03/19 1047 09/04/19 0422 09/05/19 0906 09/06/19 0321 09/06/19 0322 09/07/19 0404 09/08/19 0821  NA 136 137 137  --  138 138 136  K 4.1 4.0 4.0  --  5.0 3.8 3.8  CL 99 98 100  --  102 101 100  CO2 26 29 25   --  25 26 27   GLUCOSE 110* 90 98  --  90 85 121*  BUN 30* 19 31*  --  42* 22 36*  CREATININE 8.74* 6.81* 9.05*  --  10.41* 7.19* 9.77*  CALCIUM 8.1* 8.2* 8.3*  --  7.4* 8.3* 8.4*  MG 1.9 1.9 2.0 1.8  --  2.0  --   PHOS 3.3 2.4* 3.0  --  2.5 2.0*  --    GFR: Estimated Creatinine Clearance: 7.3 mL/min (A) (by C-G formula based on SCr of 9.77 mg/dL (H)). Liver Function Tests: Recent Labs  Lab 09/03/19 1047 09/04/19 0422 09/05/19 0906 09/06/19 0322 09/07/19 0404  ALBUMIN 2.4* 2.3* 2.2* 2.2* 2.2*   No results for input(s): LIPASE, AMYLASE in the last 168 hours. No results for input(s): AMMONIA in the last 168 hours. Coagulation Profile: No results for input(s): INR, PROTIME in the last 168 hours. Cardiac Enzymes: No results for input(s): CKTOTAL, CKMB, CKMBINDEX, TROPONINI in the last 168 hours. BNP (last 3 results) No results for input(s): PROBNP in the last 8760 hours. HbA1C: No results for input(s): HGBA1C in the last 72 hours. CBG: Recent Labs  Lab 09/07/19 1128 09/07/19 1646 09/07/19 2118 09/08/19 0553 09/08/19 1046  GLUCAP 85 93 98 81 88   Lipid Profile: No results for input(s): CHOL, HDL, LDLCALC, TRIG, CHOLHDL, LDLDIRECT in the last 72 hours. Thyroid Function Tests: No results for input(s): TSH, T4TOTAL, FREET4, T3FREE, THYROIDAB in the last 72 hours. Anemia Panel: No results for input(s): VITAMINB12, FOLATE, FERRITIN, TIBC, IRON,  RETICCTPCT in the last 72 hours. Sepsis Labs: No results for input(s): PROCALCITON, LATICACIDVEN in the last 168 hours.  Recent Results (from the past 240 hour(s))  Surgical PCR screen     Status: None   Collection Time: 08/31/19 11:26 AM   Specimen: Nasal Swab  Result Value Ref Range Status   MRSA, PCR NEGATIVE NEGATIVE Final   Staphylococcus aureus NEGATIVE NEGATIVE Final    Comment: (NOTE) The Xpert SA Assay (FDA approved for NASAL specimens in patients 17 years of age and older), is one component of a comprehensive surveillance program. It is not intended to diagnose infection nor to guide or monitor treatment. Performed at [facility], [place] [address] [place], [place] [address]   Aerobic/Anaerobic Culture (surgical/deep wound)     Status: None  Collection Time: 08/31/19  2:17 PM   Specimen: PATH Cytology Misc. fluid; Body Fluid  Result Value Ref Range Status   Specimen Description PERICARDIAL  Final   Special Requests NONE  Final   Gram Stain   Final    MODERATE WBC PRESENT,BOTH PMN AND MONONUCLEAR NO ORGANISMS SEEN    Culture   Final    No growth aerobically or anaerobically. Performed at [facility], [place] [address], [place], [place] [address]    Report Status 09/05/2019 FINAL  Final  Aerobic/Anaerobic Culture (surgical/deep wound)     Status: None   Collection Time: 08/31/19  2:29 PM   Specimen: Pericardial; Tissue  Result Value Ref Range Status   Specimen Description PERICARDIAL  Final   Special Requests NONE  Final   Gram Stain   Final    FEW WBC PRESENT, PREDOMINANTLY MONONUCLEAR NO ORGANISMS SEEN    Culture   Final    No growth aerobically or anaerobically. Performed at [facility], [place] [address] [place], [place] [address]    Report Status 09/05/2019 FINAL  Final      Radiology Studies: Dg Chest Port 1 View  Result Date: 09/07/2019 CLINICAL DATA:  Chest tube removed 2 days ago, some shortness of breath EXAM: PORTABLE CHEST 1  VIEW COMPARISON:  Portable exam 0640 hours compared to 09/05/2019 FINDINGS: RIGHT jugular line with tip projecting over SVC. LEFT heart border obscured by moderate-sized LEFT pleural effusion and basilar atelectasis. Small RIGHT pleural effusion and basilar atelectasis. Perihilar vascular congestion and peribronchial thickening present with probable perihilar edema. Atherosclerotic calcification aorta. No pneumothorax. IMPRESSION: BILATERAL pleural effusions and basilar atelectasis, moderate LEFT and small RIGHT. Enlargement of cardiac silhouette with pulmonary vascular congestion and probable mild pulmonary edema. Electronically Signed   By: Lavonia Dana M.D.   On: 09/07/2019 09:39      Scheduled Meds: . amiodarone  200 mg Oral BID   Followed by  . [START ON 09/12/2019] amiodarone  200 mg Oral Daily  . apixaban  5 mg Oral BID  . calcitRIOL  0.75 mcg Oral Q M,W,F-HD  . Chlorhexidine Gluconate Cloth  6 each Topical Q0600  . colchicine  0.3 mg Oral Q M,W,F-HD  . [START ON 09/10/2019] darbepoetin (ARANESP) injection - NON-DIALYSIS  60 mcg Subcutaneous Q Fri-1800  . docusate sodium  100 mg Oral BID  . gabapentin  100 mg Oral QHS  . insulin aspart  0-9 Units Subcutaneous TID WC  . mouth rinse  15 mL Mouth Rinse BID  . midodrine  10 mg Oral TID WC  . multivitamin  1 tablet Oral QHS  . sucroferric oxyhydroxide  1,000 mg Oral TID WC & HS   Continuous Infusions: . amiodarone    . phenylephrine (NEO-SYNEPHRINE) Adult infusion 10 mcg/min (09/05/19 0200)     LOS: 13 days      Time spent: 35 minutes   Dessa Phi, DO [facility] 09/08/2019, 11:32 AM   Available via Epic secure chat 7am-7pm After these hours, please refer to coverage provider listed on [email or web address]

## 2019-09-08 NOTE — Progress Notes (Signed)
Late entry- Pt left about 1415 for dialysis.

## 2019-09-08 NOTE — Progress Notes (Signed)
Pt returned from dialysis

## 2019-09-08 NOTE — Plan of Care (Signed)
  Problem: Activity: Goal: Ability to tolerate increased activity will improve Outcome: Progressing   Problem: Cardiac: Goal: Ability to achieve and maintain adequate cardiovascular perfusion will improve Outcome: Progressing   

## 2019-09-08 NOTE — Progress Notes (Signed)
Progress Note  Patient Name: Brenda Arnold Date of Encounter: 09/08/2019  Primary Cardiologist: Fransico Him, MD   Subjective   No complaints -stable telemetry overnight, no events. Remains in sinus. BP improved. Plan to go to CIR.  Inpatient Medications    Scheduled Meds: . amiodarone  200 mg Oral BID   Followed by  . [START ON 09/12/2019] amiodarone  200 mg Oral Daily  . apixaban  5 mg Oral BID  . calcitRIOL  0.75 mcg Oral Q M,W,F-HD  . Chlorhexidine Gluconate Cloth  6 each Topical Q0600  . colchicine  0.3 mg Oral Q M,W,F-HD  . [START ON 09/10/2019] darbepoetin (ARANESP) injection - NON-DIALYSIS  60 mcg Subcutaneous Q Fri-1800  . docusate sodium  100 mg Oral BID  . gabapentin  100 mg Oral QHS  . insulin aspart  0-9 Units Subcutaneous TID WC  . mouth rinse  15 mL Mouth Rinse BID  . midodrine  10 mg Oral TID WC  . multivitamin  1 tablet Oral QHS  . sucroferric oxyhydroxide  1,000 mg Oral TID WC & HS   Continuous Infusions: . amiodarone    . phenylephrine (NEO-SYNEPHRINE) Adult infusion 10 mcg/min (09/05/19 0200)   PRN Meds: acetaminophen **OR** acetaminophen, HYDROmorphone (DILAUDID) injection, menthol-cetylpyridinium, ondansetron (ZOFRAN) IV, senna-docusate, traMADol   Vital Signs    Vitals:   09/07/19 1923 09/07/19 2340 09/08/19 0254 09/08/19 0810  BP: (!) 99/58 96/76 (!) 127/58 (!) 131/55  Pulse: 81 88 83 96  Resp: (!) 21 (!) 23 16   Temp: 98 F (36.7 C) 99.1 F (37.3 C) 98.6 F (37 C) 98.7 F (37.1 C)  TempSrc: Oral Oral Oral Oral  SpO2: 100% 100% 100% 100%  Weight:      Height:       No intake or output data in the 24 hours ending 09/08/19 0902 Last 3 Weights 09/06/2019 09/06/2019 09/06/2019  Weight (lbs) 231 lb 14.8 oz 229 lb 4.5 oz 234 lb 12.6 oz  Weight (kg) 105.2 kg 104 kg 106.5 kg      Telemetry    Sinus rhythm- Personally Reviewed  ECG    N/A  Physical Exam   VS:  BP (!) 131/55 (BP Location: Right Arm)   Pulse 96   Temp 98.7 F  (37.1 C) (Oral)   Resp 16   Ht 5\' 6"  (1.676 m)   Wt 105.2 kg   SpO2 100%   BMI 37.43 kg/m  , BMI Body mass index is 37.43 kg/m.  General appearance: alert and no distress Neck: no carotid bruit, no JVD and thyroid not enlarged, symmetric, no tenderness/mass/nodules Lungs: diminished breath sounds bibasilar Heart: regular rate and rhythm Abdomen: soft, non-tender; bowel sounds normal; no masses,  no organomegaly and obese Extremities: extremities normal, atraumatic, no cyanosis or edema Pulses: 2+ and symmetric Skin: Skin color, texture, turgor normal. No rashes or lesions Neurologic: Grossly normal Psych: Pleasant   Labs    High Sensitivity Troponin:   Recent Labs  Lab 08/24/19 1747 08/24/19 2022 08/25/19 0931 08/25/19 1124  TROPONINIHS 7 8 12 10       Chemistry Recent Labs  Lab 09/05/19 0906 09/06/19 0322 09/07/19 0404  NA 137 138 138  K 4.0 5.0 3.8  CL 100 102 101  CO2 25 25 26   GLUCOSE 98 90 85  BUN 31* 42* 22  CREATININE 9.05* 10.41* 7.19*  CALCIUM 8.3* 7.4* 8.3*  ALBUMIN 2.2* 2.2* 2.2*  GFRNONAA 4* 4* 6*  GFRAA 5* 4* 6*  ANIONGAP 12 11 11      Hematology Recent Labs  Lab 09/06/19 0321 09/07/19 0404 09/08/19 0821  WBC 15.4* 13.7* 14.9*  RBC 2.99* 2.87* 2.90*  HGB 9.8* 9.0* 9.3*  HCT 30.2* 29.2* 29.5*  MCV 101.0* 101.7* 101.7*  MCH 32.8 31.4 32.1  MCHC 32.5 30.8 31.5  RDW 15.5 15.9* 16.2*  PLT 355 298 342    BNPNo results for input(s): BNP, PROBNP in the last 168 hours.   DDimer No results for input(s): DDIMER in the last 168 hours.   Radiology    Dg Chest Port 1 View  Result Date: 09/07/2019 CLINICAL DATA:  Chest tube removed 2 days ago, some shortness of breath EXAM: PORTABLE CHEST 1 VIEW COMPARISON:  Portable exam 0640 hours compared to 09/05/2019 FINDINGS: RIGHT jugular line with tip projecting over SVC. LEFT heart border obscured by moderate-sized LEFT pleural effusion and basilar atelectasis. Small RIGHT pleural effusion and  basilar atelectasis. Perihilar vascular congestion and peribronchial thickening present with probable perihilar edema. Atherosclerotic calcification aorta. No pneumothorax. IMPRESSION: BILATERAL pleural effusions and basilar atelectasis, moderate LEFT and small RIGHT. Enlargement of cardiac silhouette with pulmonary vascular congestion and probable mild pulmonary edema. Electronically Signed   By: Lavonia Dana M.D.   On: 09/07/2019 09:39    Cardiac Studies   Limited echo 08/31/19: 1. Left ventricular ejection fraction, by visual estimation, is 55 to 60%. The left ventricle has normal function. There is no left ventricular hypertrophy. 2. Global right ventricle was not well visualized.The right ventricular size is not well visualized. Right vetricular wall thickness was not assessed. 3. Left atrial size was normal. 4. Right atrial size was not well visualized. 5. Large pericardial effusion. 6. The mitral valve was not assessed. Not assessed mitral valve regurgitation. 7. The tricuspid valve is not assessed. Tricuspid valve regurgitation was not assessed by color flow Doppler. 8. The aortic valve was not assessed Aortic valve regurgitation was not assessed by color flow Doppler. 9. The pulmonic valve was not well visualized. Pulmonic valve regurgitation was not assessed by color flow Doppler. 10. Aortic root could not be assessed. 11. Limited study for pericardial effusion; large pericardial effusion; no RV diastolic collapse; IVC dilated; cannot R/O early tamponade; echo is larger compared to 08/28/19. 12. The interatrial septum was not assessed.  Limited Echo 08/28/19 1. Left ventricular ejection fraction, by visual estimation, is 60 to 65%. The left ventricle has normal function. Normal left ventricular size. There is no left ventricular hypertrophy. 2. Global right ventricle has normal systolic function.The right ventricular size is normal. No increase in right ventricular wall  thickness. 3. Left atrial size was normal. 4. Right atrial size was normal. 5. Moderate pericardial effusion. 6. The pericardial effusion is circumferential. 7. The mitral valve is normal in structure. No evidence of mitral valve regurgitation. No evidence of mitral stenosis. 8. The tricuspid valve is normal in structure. Tricuspid valve regurgitation is trivial. 9. The aortic valve is normal in structure. Aortic valve regurgitation was not visualized by color flow Doppler. Structurally normal aortic valve, with no evidence of sclerosis or stenosis. 10. The pulmonic valve was normal in structure. Pulmonic valve regurgitation is not visualized by color flow Doppler. 11. Normal pulmonary artery systolic pressure. 12. The inferior vena cava is normal in size with greater than 50% respiratory variability, suggesting right atrial pressure of 3 mmHg. 13. Compared to 08/26/2019, the pericardial effusion appears larger, but there is no evidence of tamponade.  Echo 08/26/19 1. Left ventricular ejection fraction,  by visual estimation, is 60 to 65%. The left ventricle has normal function. Normal left ventricular size. There is mildly increased left ventricular hypertrophy. 2. Left ventricular diastolic Doppler parameters are consistent with impaired relaxation pattern of LV diastolic filling. 3. The aortic valve is tricuspid Aortic valve regurgitation was not visualized by color flow Doppler. Mild aortic valve sclerosis without stenosis. 4. Left atrial size was normal. 5. Right atrial size was normal. 6. Global right ventricle has normal systolic function.The right ventricular size is normal. No increase in right ventricular wall thickness. 7. Trivial pericardial effusion is present. 8. The tricuspid valve is normal in structure. Tricuspid valve regurgitation was not visualized by color flow Doppler. 9. The mitral valve is normal in structure. No evidence of mitral valve regurgitation. No  evidence of mitral stenosis. 10. TR signal is inadequate for assessing pulmonary artery systolic pressure. 11. The inferior vena cava is normal in size with greater than 50% respiratory variability, suggesting right atrial pressure of 3 mmHg.  Echo 10/2: LVEF 65-70%.  RV poorly visualized.  Small pericardial effusion, significantly improved from prior.  Moderate pleural effusion.  No tamponade.  Patient Profile     63 y.o. female with end-stage renal disease on HD, paroxysmal atrial fibrillation, diabetes, hypertension, OSA, and obesity admitted with chest pain.  She was found to have pericardial effusion and tamponade.  She underwent pericardial window with Dr. Kipp Brood on 9/29.  Assessment & Plan   1. Cardiac tamponade/Pericardial effusion: Repeat echo showed a small effusion improved from prior and no tamponade.  Pericardial drain removed 10/3. She is back on Eliquis and H/H stable.  Continue colchicine for 3 months.  Given that she is on Eliquis we will not do high-dose aspirin.  No NSAIDS 2/2 ESRD. Continue midodrine - MAP 72 today.  2. Paroxysmal atrial fibrillation:  Currently remains in sinus rhythm. On po amiodarone. Restarted Eliquis 10/3.  3. ESRD on HD: Per nephrology.  Continue midodrine.  4. Hyperlipidemia:  Statin provides no mortality benefit in ESRD patients - she has refused in the past as well. Will d/c.  5. Deconditioning: PT has evaluated -recommending CIR.  6. NSVT: no further events overnight  [facility] will sign off.   Medication Recommendations:  Continue current medications Other recommendations (labs, testing, etc):  none Follow up as an outpatient:  Dr. Radford Pax or APP after CIR stay  TIME SPENT WITH PATIENT: 15 minutes of direct patient care. More than 50% of that time was spent on coordination of care and counseling regarding pericardial effusion, PAF, hyperlipidemia.    Pixie Casino, MD, [facility], [place]  Director of the [facility]  [facility] Diplomate of the American Board of Clinical Lipidology Attending Cardiologist  Direct Dial: [phone]  Fax: [phone]  Website:  [email or web address]  Pixie Casino, MD  09/08/2019, 9:02 AM

## 2019-09-09 ENCOUNTER — Encounter (HOSPITAL_COMMUNITY): Payer: Self-pay

## 2019-09-09 ENCOUNTER — Inpatient Hospital Stay (HOSPITAL_COMMUNITY)
Admission: RE | Admit: 2019-09-09 | Discharge: 2019-09-18 | DRG: 947 | Disposition: A | Discharge status: Home / Self Care | Payer: BC Managed Care – PPO | Source: Intra-hospital | Attending: Physical Medicine and Rehabilitation | Admitting: Physical Medicine and Rehabilitation

## 2019-09-09 ENCOUNTER — Other Ambulatory Visit: Payer: Self-pay

## 2019-09-09 ENCOUNTER — Encounter (HOSPITAL_COMMUNITY): Payer: Self-pay | Admitting: Physical Medicine and Rehabilitation

## 2019-09-09 DIAGNOSIS — I3139 Other pericardial effusion (noninflammatory): Secondary | ICD-10-CM

## 2019-09-09 DIAGNOSIS — N186 End stage renal disease: Secondary | ICD-10-CM

## 2019-09-09 DIAGNOSIS — Z841 Family history of disorders of kidney and ureter: Secondary | ICD-10-CM

## 2019-09-09 DIAGNOSIS — M25552 Pain in left hip: Secondary | ICD-10-CM | POA: Diagnosis present

## 2019-09-09 DIAGNOSIS — Z7982 Long term (current) use of aspirin: Secondary | ICD-10-CM

## 2019-09-09 DIAGNOSIS — Z992 Dependence on renal dialysis: Secondary | ICD-10-CM

## 2019-09-09 DIAGNOSIS — E118 Type 2 diabetes mellitus with unspecified complications: Secondary | ICD-10-CM | POA: Diagnosis not present

## 2019-09-09 DIAGNOSIS — R0602 Shortness of breath: Secondary | ICD-10-CM

## 2019-09-09 DIAGNOSIS — I120 Hypertensive chronic kidney disease with stage 5 chronic kidney disease or end stage renal disease: Secondary | ICD-10-CM | POA: Diagnosis present

## 2019-09-09 DIAGNOSIS — R079 Chest pain, unspecified: Secondary | ICD-10-CM | POA: Diagnosis not present

## 2019-09-09 DIAGNOSIS — E669 Obesity, unspecified: Secondary | ICD-10-CM | POA: Diagnosis present

## 2019-09-09 DIAGNOSIS — N2581 Secondary hyperparathyroidism of renal origin: Secondary | ICD-10-CM | POA: Diagnosis not present

## 2019-09-09 DIAGNOSIS — D631 Anemia in chronic kidney disease: Secondary | ICD-10-CM | POA: Diagnosis not present

## 2019-09-09 DIAGNOSIS — Z8249 Family history of ischemic heart disease and other diseases of the circulatory system: Secondary | ICD-10-CM

## 2019-09-09 DIAGNOSIS — E785 Hyperlipidemia, unspecified: Secondary | ICD-10-CM | POA: Diagnosis present

## 2019-09-09 DIAGNOSIS — Z6837 Body mass index (BMI) 37.0-37.9, adult: Secondary | ICD-10-CM | POA: Diagnosis not present

## 2019-09-09 DIAGNOSIS — D72829 Elevated white blood cell count, unspecified: Secondary | ICD-10-CM

## 2019-09-09 DIAGNOSIS — I480 Paroxysmal atrial fibrillation: Secondary | ICD-10-CM | POA: Diagnosis not present

## 2019-09-09 DIAGNOSIS — Z6836 Body mass index (BMI) 36.0-36.9, adult: Secondary | ICD-10-CM | POA: Diagnosis not present

## 2019-09-09 DIAGNOSIS — Z7901 Long term (current) use of anticoagulants: Secondary | ICD-10-CM

## 2019-09-09 DIAGNOSIS — I472 Ventricular tachycardia: Secondary | ICD-10-CM | POA: Diagnosis not present

## 2019-09-09 DIAGNOSIS — I309 Acute pericarditis, unspecified: Secondary | ICD-10-CM | POA: Diagnosis present

## 2019-09-09 DIAGNOSIS — Z96641 Presence of right artificial hip joint: Secondary | ICD-10-CM

## 2019-09-09 DIAGNOSIS — R5381 Other malaise: Principal | ICD-10-CM | POA: Diagnosis present

## 2019-09-09 DIAGNOSIS — I9589 Other hypotension: Secondary | ICD-10-CM | POA: Diagnosis present

## 2019-09-09 DIAGNOSIS — M1612 Unilateral primary osteoarthritis, left hip: Secondary | ICD-10-CM | POA: Diagnosis not present

## 2019-09-09 DIAGNOSIS — G8918 Other acute postprocedural pain: Secondary | ICD-10-CM

## 2019-09-09 DIAGNOSIS — E1122 Type 2 diabetes mellitus with diabetic chronic kidney disease: Secondary | ICD-10-CM | POA: Diagnosis not present

## 2019-09-09 DIAGNOSIS — Z79899 Other long term (current) drug therapy: Secondary | ICD-10-CM

## 2019-09-09 DIAGNOSIS — I313 Pericardial effusion (noninflammatory): Secondary | ICD-10-CM

## 2019-09-09 DIAGNOSIS — Z801 Family history of malignant neoplasm of trachea, bronchus and lung: Secondary | ICD-10-CM

## 2019-09-09 DIAGNOSIS — J90 Pleural effusion, not elsewhere classified: Secondary | ICD-10-CM | POA: Diagnosis not present

## 2019-09-09 DIAGNOSIS — D638 Anemia in other chronic diseases classified elsewhere: Secondary | ICD-10-CM

## 2019-09-09 DIAGNOSIS — Z833 Family history of diabetes mellitus: Secondary | ICD-10-CM

## 2019-09-09 DIAGNOSIS — I951 Orthostatic hypotension: Secondary | ICD-10-CM | POA: Diagnosis not present

## 2019-09-09 DIAGNOSIS — I959 Hypotension, unspecified: Secondary | ICD-10-CM | POA: Diagnosis present

## 2019-09-09 LAB — CBC
HCT: 29.4 % — ABNORMAL LOW (ref 36.0–46.0)
Hemoglobin: 9.4 g/dL — ABNORMAL LOW (ref 12.0–15.0)
MCH: 32.1 pg (ref 26.0–34.0)
MCHC: 32 g/dL (ref 30.0–36.0)
MCV: 100.3 fL — ABNORMAL HIGH (ref 80.0–100.0)
Platelets: 282 10*3/uL (ref 150–400)
RBC: 2.93 MIL/uL — ABNORMAL LOW (ref 3.87–5.11)
RDW: 15.9 % — ABNORMAL HIGH (ref 11.5–15.5)
WBC: 15.3 10*3/uL — ABNORMAL HIGH (ref 4.0–10.5)
nRBC: 0 % (ref 0.0–0.2)

## 2019-09-09 LAB — BASIC METABOLIC PANEL
Anion gap: 15 (ref 5–15)
BUN: 17 mg/dL (ref 8–23)
CO2: 24 mmol/L (ref 22–32)
Calcium: 8.4 mg/dL — ABNORMAL LOW (ref 8.9–10.3)
Chloride: 95 mmol/L — ABNORMAL LOW (ref 98–111)
Creatinine, Ser: 5.79 mg/dL — ABNORMAL HIGH (ref 0.44–1.00)
GFR calc Af Amer: 8 mL/min — ABNORMAL LOW (ref >60)
GFR calc non Af Amer: 7 mL/min — ABNORMAL LOW (ref >60)
Glucose, Bld: 76 mg/dL (ref 70–99)
Potassium: 4.1 mmol/L (ref 3.5–5.1)
Sodium: 134 mmol/L — ABNORMAL LOW (ref 135–145)

## 2019-09-09 LAB — GLUCOSE, CAPILLARY
Glucose-Capillary: 78 mg/dL (ref 70–99)
Glucose-Capillary: 99 mg/dL (ref 70–99)
Glucose-Capillary: 92 mg/dL (ref 70–99)

## 2019-09-09 MED ORDER — AMIODARONE HCL 200 MG PO TABS
200.0000 mg | ORAL_TABLET | Freq: Two times a day (BID) | ORAL | Status: AC
  Administered 2019-09-09 – 2019-09-11 (×5): 200 mg via ORAL
  Filled 2019-09-09 (×5): qty 1

## 2019-09-09 MED ORDER — MUSCLE RUB 10-15 % EX CREA
TOPICAL_CREAM | CUTANEOUS | Status: DC | PRN
  Administered 2019-09-18 (×2): via TOPICAL
  Filled 2019-09-09: qty 85

## 2019-09-09 MED ORDER — MIDODRINE HCL 10 MG PO TABS: 10.0000 mg | ORAL_TABLET | Freq: Three times a day (TID) | ORAL | Status: AC

## 2019-09-09 MED ORDER — MIDODRINE HCL 5 MG PO TABS
10.0000 mg | ORAL_TABLET | Freq: Three times a day (TID) | ORAL | Status: DC
  Administered 2019-09-09 – 2019-09-18 (×26): 10 mg via ORAL
  Filled 2019-09-09 (×27): qty 2

## 2019-09-09 MED ORDER — CALCITRIOL 0.25 MCG PO CAPS: 0.7500 ug | ORAL_CAPSULE | ORAL | Status: DC

## 2019-09-09 MED ORDER — AMIODARONE HCL 200 MG PO TABS: 200.0000 mg | ORAL_TABLET | Freq: Two times a day (BID) | ORAL | Status: DC

## 2019-09-09 MED ORDER — ACETAMINOPHEN 325 MG PO TABS
325.0000 mg | ORAL_TABLET | ORAL | Status: DC | PRN
  Administered 2019-09-15 – 2019-09-18 (×3): 650 mg via ORAL
  Filled 2019-09-09 (×3): qty 2

## 2019-09-09 MED ORDER — GUAIFENESIN-DM 100-10 MG/5ML PO SYRP: 5.0000 mL | ORAL_SOLUTION | Freq: Four times a day (QID) | ORAL | Status: DC | PRN

## 2019-09-09 MED ORDER — MENTHOL 3 MG MT LOZG: 1.0000 | LOZENGE | OROMUCOSAL | Status: DC | PRN

## 2019-09-09 MED ORDER — ORAL CARE MOUTH RINSE
15.0000 mL | Freq: Two times a day (BID) | OROMUCOSAL | Status: DC
  Administered 2019-09-09 – 2019-09-17 (×11): 15 mL via OROMUCOSAL

## 2019-09-09 MED ORDER — COLCHICINE 0.6 MG PO TABS: 0.3000 mg | ORAL_TABLET | ORAL | Status: DC

## 2019-09-09 MED ORDER — LIDOCAINE 5 % EX PTCH
1.0000 | MEDICATED_PATCH | CUTANEOUS | Status: DC
  Filled 2019-09-09 (×3): qty 1

## 2019-09-09 MED ORDER — PROCHLORPERAZINE 25 MG RE SUPP: 12.5000 mg | Freq: Four times a day (QID) | RECTAL | Status: DC | PRN

## 2019-09-09 MED ORDER — APIXABAN 5 MG PO TABS
5.0000 mg | ORAL_TABLET | Freq: Two times a day (BID) | ORAL | Status: DC
  Administered 2019-09-09 – 2019-09-18 (×18): 5 mg via ORAL
  Filled 2019-09-09 (×18): qty 1

## 2019-09-09 MED ORDER — AMIODARONE HCL 200 MG PO TABS: 200.0000 mg | ORAL_TABLET | Freq: Every day | ORAL | Status: DC

## 2019-09-09 MED ORDER — CALCITRIOL 0.25 MCG PO CAPS
0.7500 ug | ORAL_CAPSULE | ORAL | Status: DC
  Administered 2019-09-10 – 2019-09-17 (×4): 0.75 ug via ORAL
  Filled 2019-09-09 (×3): qty 3

## 2019-09-09 MED ORDER — BISACODYL 10 MG RE SUPP: 10.0000 mg | Freq: Every day | RECTAL | Status: DC | PRN

## 2019-09-09 MED ORDER — TROLAMINE SALICYLATE 10 % EX CREA
1.0000 "application " | TOPICAL_CREAM | Freq: Three times a day (TID) | CUTANEOUS | Status: DC | PRN
  Filled 2019-09-09: qty 85

## 2019-09-09 MED ORDER — POLYETHYLENE GLYCOL 3350 17 G PO PACK: 17.0000 g | PACK | Freq: Every day | ORAL | Status: DC | PRN

## 2019-09-09 MED ORDER — DARBEPOETIN ALFA 60 MCG/0.3ML IJ SOSY
60.0000 ug | PREFILLED_SYRINGE | INTRAMUSCULAR | Status: DC
  Administered 2019-09-10: 17:00:00 60 ug via SUBCUTANEOUS
  Filled 2019-09-09: qty 0.3

## 2019-09-09 MED ORDER — GABAPENTIN 100 MG PO CAPS
100.0000 mg | ORAL_CAPSULE | Freq: Every day | ORAL | Status: DC
  Administered 2019-09-09 – 2019-09-17 (×9): 100 mg via ORAL
  Filled 2019-09-09 (×9): qty 1

## 2019-09-09 MED ORDER — COLCHICINE 0.6 MG PO TABS
0.3000 mg | ORAL_TABLET | ORAL | Status: DC
  Administered 2019-09-10 – 2019-09-15 (×3): 0.3 mg via ORAL
  Filled 2019-09-09 (×4): qty 0.5

## 2019-09-09 MED ORDER — SUCROFERRIC OXYHYDROXIDE 500 MG PO CHEW
1000.0000 mg | CHEWABLE_TABLET | Freq: Three times a day (TID) | ORAL | Status: DC
  Administered 2019-09-09 – 2019-09-11 (×7): 1000 mg via ORAL
  Administered 2019-09-12: 21:00:00 500 mg via ORAL
  Administered 2019-09-12 – 2019-09-14 (×7): 1000 mg via ORAL
  Filled 2019-09-09 (×20): qty 2

## 2019-09-09 MED ORDER — AMIODARONE HCL 200 MG PO TABS
200.0000 mg | ORAL_TABLET | Freq: Every day | ORAL | Status: DC
  Administered 2019-09-12 – 2019-09-17 (×6): 200 mg via ORAL
  Filled 2019-09-09 (×6): qty 1

## 2019-09-09 MED ORDER — TRAMADOL HCL 50 MG PO TABS
50.0000 mg | ORAL_TABLET | Freq: Four times a day (QID) | ORAL | Status: DC | PRN
  Administered 2019-09-17 – 2019-09-18 (×2): 50 mg via ORAL
  Filled 2019-09-09 (×2): qty 1

## 2019-09-09 MED ORDER — CHLORHEXIDINE GLUCONATE CLOTH 2 % EX PADS
6.0000 | MEDICATED_PAD | Freq: Every day | CUTANEOUS | Status: DC
  Administered 2019-09-10: 06:00:00 6 via TOPICAL

## 2019-09-09 MED ORDER — INSULIN ASPART 100 UNIT/ML ~~LOC~~ SOLN: 0.0000 [IU] | Freq: Three times a day (TID) | SUBCUTANEOUS | Status: DC

## 2019-09-09 MED ORDER — PROCHLORPERAZINE MALEATE 5 MG PO TABS: 5.0000 mg | ORAL_TABLET | Freq: Four times a day (QID) | ORAL | Status: DC | PRN

## 2019-09-09 MED ORDER — TRAZODONE HCL 50 MG PO TABS: 25.0000 mg | ORAL_TABLET | Freq: Every evening | ORAL | Status: DC | PRN

## 2019-09-09 MED ORDER — PROCHLORPERAZINE EDISYLATE 10 MG/2ML IJ SOLN: 5.0000 mg | Freq: Four times a day (QID) | INTRAMUSCULAR | Status: DC | PRN

## 2019-09-09 MED ORDER — RENA-VITE PO TABS
1.0000 | ORAL_TABLET | Freq: Every day | ORAL | Status: DC
  Administered 2019-09-09 – 2019-09-17 (×9): 1 via ORAL
  Filled 2019-09-09 (×9): qty 1

## 2019-09-09 MED ORDER — ONDANSETRON HCL 4 MG/2ML IJ SOLN: 4.0000 mg | Freq: Four times a day (QID) | INTRAMUSCULAR | Status: DC | PRN

## 2019-09-09 MED ORDER — SENNOSIDES-DOCUSATE SODIUM 8.6-50 MG PO TABS
2.0000 | ORAL_TABLET | Freq: Every evening | ORAL | Status: DC | PRN
  Administered 2019-09-17: 2 via ORAL
  Filled 2019-09-09: qty 2

## 2019-09-09 MED ORDER — DOCUSATE SODIUM 100 MG PO CAPS
100.0000 mg | ORAL_CAPSULE | Freq: Two times a day (BID) | ORAL | Status: DC
  Administered 2019-09-09 – 2019-09-18 (×18): 100 mg via ORAL
  Filled 2019-09-09 (×17): qty 1

## 2019-09-09 MED ORDER — DIPHENHYDRAMINE HCL 12.5 MG/5ML PO ELIX: 12.5000 mg | ORAL_SOLUTION | Freq: Four times a day (QID) | ORAL | Status: DC | PRN

## 2019-09-09 NOTE — Progress Notes (Signed)
PROGRESS NOTE    Brenda Arnold  [id number] DOB: 12/21/1955 DOA: 08/25/2019 PCP: Kelton Pillar, MD     Brief Narrative:  Brenda Arnold is a 63 year old female with ESRD on dialysis MWF, OSA, paroxysmal atrial fibrillation who presented to the hospital on 9/25 with hypotension in setting of dialysis.  Due to cardiogenic shock, she was transferred to ICU on 9/29.  She was found to have cardiac tamponade, underwent pericardial window with drain placement on 9/29.  Drain removed 10/3.   New events last 24 hours / Subjective: No complaints this morning, denies any chest pain or shortness of breath.  She remains stable on room air.  Assessment & Plan:   Principal Problem:   Cardiac tamponade Active Problems:   Type 2 diabetes mellitus with complication, without long-term current use of insulin (HCC)   OBESITY   OBESITY-MORBID (>100')   Chest pain   OSA (obstructive sleep apnea)   Hypotension   ESRD on hemodialysis (HCC)   CAD (coronary artery disease)   Tachycardia   Pericarditis   Cardiac tamponade, pericardial effusion, acute pericarditis -Found to be in cardiac tamponade w/ pericardial effusions, status post pericardial window.  Drain removed 10/3 -Continue colchicine MWF for 3 months.  Will need CBC with platelets at least twice monthly on dialysis to ensure no agranulocytosis, per nephrology  -No NSAIDs, aspirin  Acute on chronic hypotension  -Continue midodrine  Bilateral pleural effusions likely related to pulmonary edema in setting of volume overload -Improved.  Remains on room air without any respiratory distress  Paroxysmal atrial fibrillation, A. fib RVR -Per cards: Continue amiodarone for rhythm control. Amio 400 mg BID x 1 week (thru 10/3) and then 200 mg BID for 1 week then 200 mg daily -Continue eliquis  ESRD -MWF dialysis per Nephrology  Deconditioning Debility -CIR consult   DVT prophylaxis: Eliquis Code Status: Full code Family  Communication: None Disposition Plan: CIR admission pending insurance authorization   Consultants:   PCCM  Cardiology  Cardiothoracic surgery  Nephrology   Antimicrobials:  Anti-infectives (From admission, onward)   Start     Dose/Rate Route Frequency Ordered Stop   09/01/19 1300  ceFAZolin (ANCEF) IVPB 1 g/50 mL premix     1 g 100 mL/hr over 30 Minutes Intravenous Every 24 hr x 2 08/31/19 1650 09/01/19 1802   08/31/19 1245  ceFAZolin (ANCEF) IVPB 2g/100 mL premix     2 g 200 mL/hr over 30 Minutes Intravenous  Once 08/31/19 1238 08/31/19 1325   08/31/19 1239  ceFAZolin (ANCEF) 2-4 GM/100ML-% IVPB    Note to Pharmacy: Henrine Screws   : cabinet override      08/31/19 1239 08/31/19 1315       Objective: Vitals:   09/08/19 2001 09/08/19 2326 09/09/19 0344 09/09/19 0821  BP: (!) 108/57 104/61 92/67   Pulse: 90   91  Resp: 18 18 18    Temp: 98.5 F (36.9 C) 98.2 F (36.8 C) 99 F (37.2 C) 98.4 F (36.9 C)  TempSrc: Oral Oral Oral Oral  SpO2: 96% 96% 95% 93%  Weight:      Height:        Intake/Output Summary (Last 24 hours) at 09/09/2019 1036 Last data filed at 09/08/2019 1950 Gross per 24 hour  Intake 240 ml  Output 2500 ml  Net -2260 ml   Filed Weights   09/06/19 1953 09/08/19 1407 09/08/19 1820  Weight: 105.2 kg 107.2 kg 104 kg    Examination: General exam: Appears  calm and comfortable  Respiratory system: Clear to auscultation. Respiratory effort normal. Cardiovascular system: S1 & S2 heard, RRR. No JVD, murmurs, rubs, gallops or clicks. No pedal edema.  On room air Gastrointestinal system: Abdomen is nondistended, soft and nontender. No organomegaly or masses felt. Normal bowel sounds heard. Central nervous system: Alert and oriented. No focal neurological deficits. Extremities: Symmetric 5 x 5 power. Skin: No rashes, lesions or ulcers Psychiatry: Judgement and insight appear normal. Mood & affect appropriate.   Data Reviewed: I have personally reviewed  following labs and imaging studies  CBC: Recent Labs  Lab 09/03/19 1047 09/04/19 0422 09/05/19 0906 09/06/19 0321 09/07/19 0404 09/08/19 0821 09/09/19 0226  WBC 11.2* 12.7* 13.9* 15.4* 13.7* 14.9* 15.3*  NEUTROABS 8.7* 9.4* 10.5* 11.4* 9.7*  --   --   HGB 9.4* 9.7* 9.8* 9.8* 9.0* 9.3* 9.4*  HCT 29.6* 30.4* 30.4* 30.2* 29.2* 29.5* 29.4*  MCV 101.7* 101.0* 102.7* 101.0* 101.7* 101.7* 100.3*  PLT 356 350 320 355 298 342 [id number]   Basic Metabolic Panel: Recent Labs  Lab 09/03/19 1047 09/04/19 0422 09/05/19 0906 09/06/19 0321 09/06/19 0322 09/07/19 0404 09/08/19 0821 09/09/19 0226  NA 136 137 137  --  138 138 136 134*  K 4.1 4.0 4.0  --  5.0 3.8 3.8 4.1  CL 99 98 100  --  102 101 100 95*  CO2 26 29 25   --  25 26 27 24   GLUCOSE 110* 90 98  --  90 85 121* 76  BUN 30* 19 31*  --  42* 22 36* 17  CREATININE 8.74* 6.81* 9.05*  --  10.41* 7.19* 9.77* 5.79*  CALCIUM 8.1* 8.2* 8.3*  --  7.4* 8.3* 8.4* 8.4*  MG 1.9 1.9 2.0 1.8  --  2.0  --   --   PHOS 3.3 2.4* 3.0  --  2.5 2.0*  --   --    GFR: Estimated Creatinine Clearance: 12.3 mL/min (A) (by C-G formula based on SCr of 5.79 mg/dL (H)). Liver Function Tests: Recent Labs  Lab 09/03/19 1047 09/04/19 0422 09/05/19 0906 09/06/19 0322 09/07/19 0404  ALBUMIN 2.4* 2.3* 2.2* 2.2* 2.2*   No results for input(s): LIPASE, AMYLASE in the last 168 hours. No results for input(s): AMMONIA in the last 168 hours. Coagulation Profile: No results for input(s): INR, PROTIME in the last 168 hours. Cardiac Enzymes: No results for input(s): CKTOTAL, CKMB, CKMBINDEX, TROPONINI in the last 168 hours. BNP (last 3 results) No results for input(s): PROBNP in the last 8760 hours. HbA1C: No results for input(s): HGBA1C in the last 72 hours. CBG: Recent Labs  Lab 09/07/19 2118 09/08/19 0553 09/08/19 1046 09/08/19 2119 09/09/19 0621  GLUCAP 98 81 88 123* 78   Lipid Profile: No results for input(s): CHOL, HDL, LDLCALC, TRIG, CHOLHDL,  LDLDIRECT in the last 72 hours. Thyroid Function Tests: No results for input(s): TSH, T4TOTAL, FREET4, T3FREE, THYROIDAB in the last 72 hours. Anemia Panel: No results for input(s): VITAMINB12, FOLATE, FERRITIN, TIBC, IRON, RETICCTPCT in the last 72 hours. Sepsis Labs: No results for input(s): PROCALCITON, LATICACIDVEN in the last 168 hours.  Recent Results (from the past 240 hour(s))  Surgical PCR screen     Status: None   Collection Time: 08/31/19 11:26 AM   Specimen: Nasal Swab  Result Value Ref Range Status   MRSA, PCR NEGATIVE NEGATIVE Final   Staphylococcus aureus NEGATIVE NEGATIVE Final    Comment: (NOTE) The Xpert SA Assay (FDA approved for NASAL specimens in  patients 36 years of age and older), is one component of a comprehensive surveillance program. It is not intended to diagnose infection nor to guide or monitor treatment. Performed at [facility], [place] [address], [place], [place] [address]   Aerobic/Anaerobic Culture (surgical/deep wound)     Status: None   Collection Time: 08/31/19  2:17 PM   Specimen: PATH Cytology Misc. fluid; Body Fluid  Result Value Ref Range Status   Specimen Description PERICARDIAL  Final   Special Requests NONE  Final   Gram Stain   Final    MODERATE WBC PRESENT,BOTH PMN AND MONONUCLEAR NO ORGANISMS SEEN    Culture   Final    No growth aerobically or anaerobically. Performed at [facility], [place] [address], [place], [place] [address]    Report Status 09/05/2019 FINAL  Final  Aerobic/Anaerobic Culture (surgical/deep wound)     Status: None   Collection Time: 08/31/19  2:29 PM   Specimen: Pericardial; Tissue  Result Value Ref Range Status   Specimen Description PERICARDIAL  Final   Special Requests NONE  Final   Gram Stain   Final    FEW WBC PRESENT, PREDOMINANTLY MONONUCLEAR NO ORGANISMS SEEN    Culture   Final    No growth aerobically or anaerobically. Performed at [facility], [place] [address],  [place], [place] [address]    Report Status 09/05/2019 FINAL  Final      Radiology Studies: No results found.    Scheduled Meds: . amiodarone  200 mg Oral BID   Followed by  . [START ON 09/12/2019] amiodarone  200 mg Oral Daily  . apixaban  5 mg Oral BID  . calcitRIOL  0.75 mcg Oral Q M,W,F-HD  . Chlorhexidine Gluconate Cloth  6 each Topical Q0600  . colchicine  0.3 mg Oral Q M,W,F-HD  . [START ON 09/10/2019] darbepoetin (ARANESP) injection - NON-DIALYSIS  60 mcg Subcutaneous Q Fri-1800  . docusate sodium  100 mg Oral BID  . gabapentin  100 mg Oral QHS  . insulin aspart  0-9 Units Subcutaneous TID WC  . mouth rinse  15 mL Mouth Rinse BID  . midodrine  10 mg Oral TID WC  . multivitamin  1 tablet Oral QHS  . sucroferric oxyhydroxide  1,000 mg Oral TID WC & HS   Continuous Infusions:    LOS: 14 days      Time spent: 20 minutes   Dessa Phi, DO [facility] 09/09/2019, 10:36 AM   Available via Epic secure chat 7am-7pm After these hours, please refer to coverage provider listed on [email or web address]

## 2019-09-09 NOTE — Progress Notes (Addendum)
Report given to [facility] , in rehab .for [facility].

## 2019-09-09 NOTE — Progress Notes (Addendum)
[facility] Progress Note   Assessment/ Plan:   Dialysis: [facility] MWF   4h 200Re   450/800   99kg   2/2 bath  Hep 10000  L AVF Parsabiv 15 Calcitriol 0.75 Recent OP Labs: Hgb 12.2 K 4.7 Ca 8.3 Phos 3.9 PTH 1960  Assessment/Plan: 1. Pericardial effusion/ tamponade/ acute pericarditis - EKG with diffuse ST elevation. Echo showed LVEF of 60-65%, normal RV function.  CP improved but hypotension worsened and pt taken for pericardial window on 08/31/19. BP's improved w/ drainage of effusion. Per Card surg and cardiology. Drain out 10/3, repeat echo with small effusion and no evidence of tamponade. On colchicine MWF x 3 months, recommend getting a full CBC with plts at least twice monthly on HD to ensure no agranulocytosis.    2. Hypertension/volume:chronic BP issue on midodrine.Probing for EDW  3. ESRD:HD MWF. HD on schecdule 10/7 Was on eliquis at home and heparin 10K with HD--> hasn't been needing a prn bolus, will d/c.  Given effusion- will need to ensure accurate EDW achieved and will probe for EDW.  Also may need to re-evaluate OP prescription, pt initially had a run time of 4:30, may need to add back 15 min.  4. L AVF aneurysms: evaluated by Dr. Donnetta Hutching, reports it is safe to use access and to avoid areas of superficial blistering.    5. Atrial fib: Converted to NSR. Amio and Eliquis  6. Anemia ckd + abl: Hb low 9's, will start on darbe 60 ug 1st dose today 10/3, then weekly on Friday.   7. Metabolic bone disease: Continue bindersand calcitriol. On Parasbivfor 2HPTH- not availablein hospital. Resume at discharge.  8. NSVT: per cardiology, no BB d/t requiring midodrine. Already on 3K bath.  9. Dispo: CIR- from renal perspective can go anytime   Subjective:    Sleeping with no complaints this AM--> wants to go to CIR.     Objective:   BP 92/67 (BP Location: Right Arm)   Pulse 91   Temp 98.4 F (36.9 C) (Oral)   Resp 18   Ht 5\' 6"  (1.676 m)   Wt 104 kg    SpO2 93%   BMI 37.01 kg/m   Physical Exam: Gen: NAD, lying in bed CVS: RRR no rub heard Resp: clear anteriorly no m/r/g Abd: soft, obese, NABS Ext: no LE edema ACCESS: L FA AVF, aneurysmal  Labs: BMET Recent Labs  Lab 09/03/19 1047 09/04/19 0422 09/05/19 0906 09/06/19 0322 09/07/19 0404 09/08/19 0821 09/09/19 0226  NA 136 137 137 138 138 136 134*  K 4.1 4.0 4.0 5.0 3.8 3.8 4.1  CL 99 98 100 102 101 100 95*  CO2 26 29 25 25 26 27 24   GLUCOSE 110* 90 98 90 85 121* 76  BUN 30* 19 31* 42* 22 36* 17  CREATININE 8.74* 6.81* 9.05* 10.41* 7.19* 9.77* 5.79*  CALCIUM 8.1* 8.2* 8.3* 7.4* 8.3* 8.4* 8.4*  PHOS 3.3 2.4* 3.0 2.5 2.0*  --   --    CBC Recent Labs  Lab 09/04/19 0422 09/05/19 0906 09/06/19 0321 09/07/19 0404 09/08/19 0821 09/09/19 0226  WBC 12.7* 13.9* 15.4* 13.7* 14.9* 15.3*  NEUTROABS 9.4* 10.5* 11.4* 9.7*  --   --   HGB 9.7* 9.8* 9.8* 9.0* 9.3* 9.4*  HCT 30.4* 30.4* 30.2* 29.2* 29.5* 29.4*  MCV 101.0* 102.7* 101.0* 101.7* 101.7* 100.3*  PLT 350 320 355 298 342 282    @IMGRELPRIORS @ Medications:    . amiodarone  200 mg Oral  BID   Followed by  . [START ON 09/12/2019] amiodarone  200 mg Oral Daily  . apixaban  5 mg Oral BID  . calcitRIOL  0.75 mcg Oral Q M,W,F-HD  . Chlorhexidine Gluconate Cloth  6 each Topical Q0600  . colchicine  0.3 mg Oral Q M,W,F-HD  . [START ON 09/10/2019] darbepoetin (ARANESP) injection - NON-DIALYSIS  60 mcg Subcutaneous Q Fri-1800  . docusate sodium  100 mg Oral BID  . gabapentin  100 mg Oral QHS  . insulin aspart  0-9 Units Subcutaneous TID WC  . mouth rinse  15 mL Mouth Rinse BID  . midodrine  10 mg Oral TID WC  . multivitamin  1 tablet Oral QHS  . sucroferric oxyhydroxide  1,000 mg Oral TID WC & HS     Madelon Lips MD 09/09/2019, 10:16 AM

## 2019-09-09 NOTE — Progress Notes (Signed)
Occupational Therapy Treatment Patient Details Name: Brenda Arnold MRN: [id number] DOB: August 28, 1956 Today's Date: 09/09/2019    History of present illness 63 year old lady with prior h/o type 2 DM, hypotension, on midodrine, ESRD on HD, CAD, OSA presents to ED FOR chest pain. Chest pain possibly pericarditis. Recovery complicated by hypotension now s/p R VATS, pericardial window and R chest tube insertion on 08/31/19.   OT comments  Pt making excellent progress. Feel pt will benefit from short CIR stay to reach modified independent level ogals to DC home with intermittent S.   Follow Up Recommendations  CIR;Supervision - Intermittent    Equipment Recommendations  3 in 1 bedside commode    Recommendations for Other Services      Precautions / Restrictions Precautions Precautions: Fall       Mobility Bed Mobility Overal bed mobility: Modified Independent                Transfers Overall transfer level: Needs assistance Equipment used: Rolling walker (2 wheeled) Transfers: Sit to/from Stand Sit to Stand: Min assist              Balance Overall balance assessment: Needs assistance   Sitting balance-Leahy Scale: Good       Standing balance-Leahy Scale: Fair                             ADL either performed or assessed with clinical judgement   ADL Overall ADL's : Needs assistance/impaired     Grooming: Min guard;Standing   Upper Body Bathing: Set up;Sitting   Lower Body Bathing: Minimal assistance;Sit to/from stand   Upper Body Dressing : Set up;Sitting   Lower Body Dressing: Minimal assistance;Sit to/from stand   Toilet Transfer: Minimal assistance;RW;Ambulation   Toileting- Water quality scientist and Hygiene: Min guard;Sit to/from stand       Functional mobility during ADLs: Rolling walker;Minimal assistance General ADL Comments: fatigues easily during ADL     Vision       Perception     Praxis      Cognition  Arousal/Alertness: Awake/alert Behavior During Therapy: WFL for tasks assessed/performed Overall Cognitive Status: Within Functional Limits for tasks assessed                                          Exercises     Shoulder Instructions       General Comments      Pertinent Vitals/ Pain       Pain Assessment: 0-10 Faces Pain Scale: Hurts a little bit Pain Location: chest from chest tube site Pain Descriptors / Indicators: Discomfort Pain Intervention(s): Limited activity within patient's tolerance  Home Living                                          Prior Functioning/Environment              Frequency  Min 2X/week        Progress Toward Goals  OT Goals(current goals can now be found in the care plan section)  Progress towards OT goals: Progressing toward goals  Acute Rehab OT Goals Patient Stated Goal: to return to independent OT Goal Formulation: With patient Time For Goal Achievement: 09/16/19 Potential to  Achieve Goals: Good ADL Goals Pt Will Perform Grooming: with modified independence;standing Pt Will Perform Lower Body Bathing: with modified independence;with adaptive equipment;sit to/from stand Pt Will Perform Lower Body Dressing: with modified independence;with adaptive equipment;sit to/from stand Pt Will Transfer to Toilet: with modified independence;ambulating;bedside commode Pt Will Perform Toileting - Clothing Manipulation and hygiene: with modified independence;sit to/from stand Pt Will Perform Tub/Shower Transfer: with modified independence;tub bench;rolling walker;ambulating Additional ADL Goal #1: pt will perform bed mobility modified independently in preparation for ADL.  Plan Discharge plan remains appropriate;Frequency remains appropriate    Co-evaluation                 AM-PAC OT "6 Clicks" Daily Activity     Outcome Measure   Help from another person eating meals?: None Help from another  person taking care of personal grooming?: A Little Help from another person toileting, which includes using toliet, bedpan, or urinal?: A Little Help from another person bathing (including washing, rinsing, drying)?: A Lot Help from another person to put on and taking off regular upper body clothing?: A Little Help from another person to put on and taking off regular lower body clothing?: A Little 6 Click Score: 18    End of Session Equipment Utilized During Treatment: Rolling walker  OT Visit Diagnosis: Unsteadiness on feet (R26.81);Other abnormalities of gait and mobility (R26.89);Pain;Muscle weakness (generalized) (M62.81) Pain - part of body: (chest)   Activity Tolerance Patient tolerated treatment well   Patient Left in chair;with call bell/phone within reach   Nurse Communication Mobility status        Time: [id number] OT Time Calculation (min): 20 min  Charges: OT General Charges $OT Visit: 1 Visit OT Treatments $Self Care/Home Management : 8-22 mins  Maurie Boettcher, OT/L   Acute OT Clinical Specialist [place] Pager [phone] Office [phone]    [facility] 09/09/2019, 2:05 PM

## 2019-09-09 NOTE — Progress Notes (Addendum)
Inpatient Rehabilitation Admissions Coordinator  I await insurance decision for a possible inpt rehab admit today or tomorrow.  Danne Baxter, RN, MSN Rehab Admissions Coordinator [phone] 09/09/2019 10:38 AM    I have received insurance approval to admit pt to inpt rehab today. Pt in agreement. Freda Munro in hemodialysis made aware. I will notify Dr. Maylene Roes and make arrangements to admit today.  Danne Baxter, RN, MSN Rehab Admissions Coordinator [phone] 09/09/2019 2:01 PM

## 2019-09-09 NOTE — Progress Notes (Signed)
Jamse Arn, MD  Physician  Physical Medicine and Rehabilitation  PMR Pre-admission  Signed  Date of Service:  09/09/2019 2:10 PM      Related encounter: ED to Hosp-Admission (Discharged) from 08/25/2019 in [place]         Show:Clear all '[x]' Manual'[x]' Template'[]' Copied  Added by: '[x]' Cristina Gong, RN'[x]' Jamse Arn, MD  '[]' Hover for details PMR Admission Coordinator Pre-Admission Assessment  Patient: Brenda Arnold is an 63 y.o., female MRN: [id number] DOB: 1956/05/27 Height: '5\' 6"'  (167.6 cm) Weight: 104 kg  Insurance Information HMO:     PPO: yes     PCP:      IPA:      80/20:      OTHER:  PRIMARY: BC[employer] [place]      Policy#: [id number]      Subscriber: pt CM Name: Dorathy Daft      Phone#: [phone]     Fax#: [phone] Pre-Cert#: [id number] updates due 10/14 to same CM and fax      Employer: [employer] Benefits:  Phone #: [phone]     Name: 09/08/2019 Eff. Date: 12/02/2018     Deduct: $1000/met      Out of Pocket Max: [phone] includes deductible and met      Life Max: none CIR: $435 co pay per admit then 80%      SNF: $435 co pay per admit then 80% Outpatient: 80%     Co-Pay: limited to 30 visits combined per year Home Health: 80%      Co-Pay: limited by medical neccesity DME: 80%     Co-Pay: 20% Providers: in network  SECONDARY: none       Medicaid Application Date:       Case Manager:  Disability Application Date:       Case Worker:   The "Data Collection Information Summary" for patients in Inpatient Rehabilitation Facilities with attached "Privacy Act [place] Records" was provided and verbally reviewed with: N/A  Emergency Contact Information         Contact Information    Name Relation Home Work Mobile   Bryant,[name]   [phone]   Kenlie, Seki Mother   [phone]      Current Medical History  Patient Admitting  Diagnosis: debility  History of Present Illness: 63 year old female with past medical history for ESRD on HD, PAF, DM 2, OSA who presented for the second time in 24 hrs on 08/25/2019 for evaluation of chest pain in the setting of dialysis. Initially chest pain felt secondary to pericarditis. Began colchicine during dialysis days. Echo revealed moderate pericardial effusion without signs of tamponade. Also had atrial fibrillation with RVR and began IV amiodarone. She then developed cardiogenic shock and transferred to ICU on 08/31/2019. Found to have tamponade and underwent pericardial window with drain placement on 08/31/2019. Drain removed 09/04/2019.  To continue colchicine MWF for 3 months. Recommend CBC with platelets at least twice monthly on dialysis to ensure no agranulocytosis per nephrology. NO NSAIDS or aspirin. Follow up with Dr. Kipp Brood with CVTS after d/c.   Acute on chronic hypotension to continue midodrine. Bilateral pleural effusions likely related to pulmonary edema in setting of volume overload. Remains on room air. PAF with RVR to continue on amiodarone. Continue Eliquis. Follow up with Dr. Radford Pax, cardiology after d/c.  Patient's medical record from [facility]  has been reviewed by the rehabilitation admission coordinator and  physician.  Past Medical History      Past Medical History:  Diagnosis Date  . Anemia   . Arthritis   . Asthma   . Complication of anesthesia    difficulty with getting oxygen saturation up  . Diabetes mellitus   . Dyslipidemia   . Epistaxis 11/05/2012  . ESRD (end stage renal disease) on dialysis [facility])    "MWF; Jeneen Rinks" (09/15/2018)  . History of nuclear stress test    Myoview 5/18: EF 68, no infarct or ischemia, low risk  . HTN (hypertension)   . Hyperlipidemia   . Hyperparathyroidism due to renal insufficiency ([place])   . Obesity    s/p panniculectomy  . Paroxysmal A-fib (HCC)    a. chronic coumadin;  b.  12/2009 Echo: EF 60-65%, Gr 1 DD.  Marland Kitchen PCOS (polycystic ovarian syndrome)   . Pericarditis 08/2019  . Pneumonia   . Seasonal allergies   . Sleep apnea    a. not using CPAP, last study  >8 yrs  . Vitamin D deficiency     Family History   family history includes Diabetes in her brother; Hypertension in her mother; Kidney disease in her brother; Lung cancer in her father.  Prior Rehab/Hospitalizations Has the patient had prior rehab or hospitalizations prior to admission? Yes  Has the patient had major surgery during 100 days prior to admission? Yes             Current Medications  Current Facility-Administered Medications:  .  acetaminophen (TYLENOL) tablet 650 mg, 650 mg, Oral, Q6H PRN, 650 mg at 09/03/19 1610 **OR** acetaminophen (TYLENOL) suppository 650 mg, 650 mg, Rectal, Q6H PRN, Lars Pinks M, PA-C .  amiodarone (PACERONE) tablet 200 mg, 200 mg, Oral, BID, 200 mg at 09/09/19 0933 **FOLLOWED BY** [START ON 09/12/2019] amiodarone (PACERONE) tablet 200 mg, 200 mg, Oral, Daily, Donato Heinz, MD .  apixaban (ELIQUIS) tablet 5 mg, 5 mg, Oral, BID, Skeet Latch, MD, 5 mg at 09/09/19 0934 .  calcitRIOL (ROCALTROL) capsule 0.75 mcg, 0.75 mcg, Oral, Q M,W,F-HD, Lars Pinks M, PA-C, 0.75 mcg at 09/08/19 1841 .  Chlorhexidine Gluconate Cloth 2 % PADS 6 each, 6 each, Topical, Q0600, Roney Jaffe, MD, 6 each at 09/09/19 [phone] .  colchicine tablet 0.3 mg, 0.3 mg, Oral, Q M,W,F-HD, Lars Pinks M, PA-C, 0.3 mg at 09/08/19 1209 .  [START ON 09/10/2019] Darbepoetin Alfa (ARANESP) injection 60 mcg, 60 mcg, Subcutaneous, Q Fri-1800, Roney Jaffe, MD .  docusate sodium (COLACE) capsule 100 mg, 100 mg, Oral, BID, Kyle, Tyrone A, DO, 100 mg at 09/09/19 0934 .  gabapentin (NEURONTIN) capsule 100 mg, 100 mg, Oral, QHS, Lars Pinks M, PA-C, 100 mg at 09/08/19 2136 .  HYDROmorphone (DILAUDID) injection 0.5-1 mg, 0.5-1 mg, Intravenous, Q6H PRN,  Lars Pinks M, PA-C, 1 mg at 09/02/19 1707 .  insulin aspart (novoLOG) injection 0-9 Units, 0-9 Units, Subcutaneous, TID WC, Frederik Pear, MD, Stopped at 09/08/19 0756 .  MEDLINE mouth rinse, 15 mL, Mouth Rinse, BID, Lightfoot, Harrell O, MD, 15 mL at 09/09/19 0935 .  menthol-cetylpyridinium (CEPACOL) lozenge 3 mg, 1 lozenge, Oral, PRN, Nani Skillern, PA-C, 3 mg at 08/29/19 1203 .  midodrine (PROAMATINE) tablet 10 mg, 10 mg, Oral, TID WC, Zimmerman, Donielle M, PA-C, 10 mg at 09/09/19 1212 .  multivitamin (RENA-VIT) tablet 1 tablet, 1 tablet, Oral, QHS, Lars Pinks M, PA-C, 1 tablet at 09/08/19 2135 .  ondansetron (ZOFRAN) injection 4 mg, 4 mg, Intravenous, Q6H PRN,  Lars Pinks M, PA-C, 4 mg at 08/31/19 1654 .  senna-docusate (Senokot-S) tablet 2 tablet, 2 tablet, Oral, QHS PRN, Nani Skillern, PA-C, 2 tablet at 08/31/19 0037 .  sucroferric oxyhydroxide (VELPHORO) chewable tablet 1,000 mg, 1,000 mg, Oral, TID WC & HS, Lars Pinks M, PA-C, 1,000 mg at 09/09/19 1212 .  traMADol (ULTRAM) tablet 50 mg, 50 mg, Oral, Q12H PRN, Lars Pinks M, PA-C, 50 mg at 09/03/19 0906  Patients Current Diet:  Diet Order                  Diet renal/carb modified with fluid restriction Diet-HS Snack? Nothing; Fluid restriction: 1200 mL Fluid; Room service appropriate? Yes; Fluid consistency: Thin  Diet effective now               Precautions / Restrictions Precautions Precautions: Fall Precaution Comments: R chest tube Restrictions Weight Bearing Restrictions: No   Has the patient had 2 or more falls or a fall with injury in the past year? No  Prior Activity Level Community (5-7x/wk): independent; driving, works fulltime remotely medicare related   Prior Functional Level Self Care: Did the patient need help bathing, dressing, using the toilet or eating? Independent  Indoor Mobility: Did the patient need assistance with walking  from room to room (with or without device)? Independent  Stairs: Did the patient need assistance with internal or external stairs (with or without device)? Independent  Functional Cognition: Did the patient need help planning regular tasks such as shopping or remembering to take medications? [place] / Equipment Home Assistive Devices/Equipment: Eyeglasses, Radio producer (specify quad or straight), Walker (specify type) Home Equipment: Tub bench, Walker - 2 wheels, Cane - single point, Adaptive equipment  Prior Device Use: Indicate devices/aids used by the patient prior to current illness, exacerbation or injury? Walker  Current Functional Level Cognition  Overall Cognitive Status: Within Functional Limits for tasks assessed Orientation Level: Oriented X4    Extremity Assessment (includes Sensation/Coordination)  Upper Extremity Assessment: Generalized weakness  Lower Extremity Assessment: Defer to PT evaluation    ADLs  Overall ADL's : Needs assistance/impaired Eating/Feeding: Independent, Sitting Grooming: Min guard, Standing Upper Body Bathing: Set up, Sitting Lower Body Bathing: Minimal assistance, Sit to/from stand Upper Body Dressing : Set up, Sitting Lower Body Dressing: Minimal assistance, Sit to/from stand Toilet Transfer: Minimal assistance, RW, Ambulation Toilet Transfer Details (indicate cue type and reason): 3:1 over commode Toileting- Clothing Manipulation and Hygiene: Min guard, Sit to/from stand Functional mobility during ADLs: Rolling walker, Minimal assistance General ADL Comments: fatigues easily during ADL    Mobility  Overal bed mobility: Modified Independent Bed Mobility: Supine to Sit Supine to sit: Min guard General bed mobility comments: HOB elevated, increased time and effort, steadying assist only    Transfers  Overall transfer level: Needs assistance Equipment used: Rolling walker (2 wheeled) Transfers: Sit  to/from Stand Sit to Stand: Min assist General transfer comment: min assist for safety/balance, cueing for hand placement     Ambulation / Gait / Stairs / Wheelchair Mobility  Ambulation/Gait Ambulation/Gait assistance: Counsellor (Feet): 100 Feet Assistive device: (EVA walker) Gait Pattern/deviations: Step-through pattern General Gait Details: wide BOS, mild instability, kept in check by EVA walker.  Sats difficult to get during gait, but 02% on RA within 30-40 secs of sitting.  EHR 115 bpm with gait.  Dyspnea 1-2/4 Gait velocity: slower Gait velocity interpretation: <1.8 ft/sec, indicate of risk for recurrent falls    Posture /  Balance Dynamic Sitting Balance Sitting balance - Comments: bracing due to pain Balance Overall balance assessment: Needs assistance Sitting-balance support: Feet supported Sitting balance-Leahy Scale: Good Sitting balance - Comments: bracing due to pain Standing balance support: Bilateral upper extremity supported, During functional activity, Single extremity supported Standing balance-Leahy Scale: Fair Standing balance comment: pt reliant on BUE support dynamically, able to stand during ADls with 0-1 support but preference to B support    Special needs/care consideration BiPAP/CPAP yes CPM  N/a Continuous Drip IV n/a Dialysis MWF [address] 0530 am drives self to hemodialysis Life Vest  N/a Oxygen  N/a Special Bed  N/a Trach Size  N/a Wound Vac n/a Skin Left forearm fistula; right chest dressing 1.5 cm x 1 cm Bowel mgmt: LBM 10/7 continent Bladder mgmt:  anuric Diabetic mgmt: n/a Behavioral consideration  N/a Chemo/radiation  N/a Designated visitor TBD   Previous Home Environment  Living Arrangements: Alone  Lives With: Alone Available Help at Discharge: (42 year old Mom likely to come from [facility] to stay with her) Type of Home: House Home Layout: One level Home Access: Stairs to enter Entrance Stairs-Rails: None  Entrance Stairs-Number of Steps: 1 step up inside from the back Bathroom Shower/Tub: Chiropodist: Standard Bathroom Accessibility: Yes How Accessible: Accessible via walker Home Care Services: No  Discharge Living Setting Plans for Discharge Living Setting: Patient's home, Alone Type of Home at Discharge: House Discharge Home Layout: One level Discharge Home Access: Stairs to enter Entrance Stairs-Rails: None Entrance Stairs-Number of Steps: 1 step up from back door Discharge Bathroom Shower/Tub: Tub/shower unit Discharge Bathroom Toilet: Standard Discharge Bathroom Accessibility: Yes How Accessible: Accessible via walker Does the patient have any problems obtaining your medications?: No  Social/Family/Support Systems Patient Roles: (fulltime employee) Contact Information: Dell Ponto, friend Anticipated Caregiver: Leeann Must to come from [place]: see above Ability/Limitations of Caregiver: none Caregiver Availability: 24/7 Discharge Plan Discussed with Primary Caregiver: Yes Is Caregiver In Agreement with Plan?: Yes Does Caregiver/Family have Issues with Lodging/Transportation while Pt is in Rehab?: No  Goals/Additional Needs Patient/Family Goal for Rehab: Mod I to supervision with PT and OT Expected length of stay: ELOS 10 to 12 days Special Service Needs: Hemodialysis MWF Pt/Family Agrees to Admission and willing to participate: Yes Program Orientation Provided & Reviewed with Pt/Caregiver Including Roles  & Responsibilities: Yes  Decrease burden of Care through IP rehab admission: n/a  Possible need for SNF placement upon discharge: not anticipated  Patient Condition: I have reviewed medical records from [employer] , spoken with  patient. I met with patient at the bedside for inpatient rehabilitation assessment.  Patient will benefit from ongoing PT and OT, can actively participate in  3 hours of therapy a day 5 days of the week, and can make measurable gains during the admission.  Patient will also benefit from the coordinated team approach during an Inpatient Acute Rehabilitation admission.  The patient will receive intensive therapy as well as Rehabilitation physician, nursing, social worker, and care management interventions.  Due to bladder management, bowel management, safety, skin/wound care, disease management, medication administration, pain management and patient education the patient requires 24 hour a day rehabilitation nursing.  The patient is currently min to mod assist with mobility and basic ADLs.  Discharge setting and therapy post discharge at home with home health is anticipated.  Patient has agreed to participate in the Acute Inpatient Rehabilitation Program and will admit today.  Preadmission Screen Completed By:  Cleatrice Burke, 09/09/2019 2:12 PM ______________________________________________________________________   Discussed status with Dr. Posey Pronto  on 09/09/2019 at  1430 and received approval for admission today.  Admission Coordinator:  Cleatrice Burke, RN, time  7639 Date  09/09/2019   Assessment/Plan: Diagnosis: Debility 1. Does the need for close, 24 hr/day Medical supervision in concert with the patient's rehab needs make it unreasonable for this patient to be served in a less intensive setting? Potentially 2. Co-Morbidities requiring supervision/potential complications: ESRD on HD, PAF, DM 2, OSA  3. Due to safety, skin/wound care, disease management, pain management and patient education, does the patient require 24 hr/day rehab nursing? Potentially 4. Does the patient require coordinated care of a physician, rehab nurse, PT, OT, and SLP to address physical and functional deficits in the context of the above medical diagnosis(es)? Potentially Addressing deficits in the following areas: balance, endurance, locomotion, transferring,  toileting and psychosocial support 5. Can the patient actively participate in an intensive therapy program of at least 3 hrs of therapy 5 days a week? Yes 6. The potential for patient to make measurable gains while on inpatient rehab is good 7. Anticipated functional outcomes upon discharge from inpatient rehab: modified independent PT, modified independent OT, n/a SLP 8. Estimated rehab length of stay to reach the above functional goals is: 4-6 days. 9. Anticipated discharge destination: Home 10. Overall Rehab/Functional Prognosis: good   MD Signature: Delice Lesch, MD, ABPMR        Revision History

## 2019-09-09 NOTE — H&P (Addendum)
Physical Medicine and Rehabilitation Admission H&P    Chief Complaint  Patient presents with  . Debility.    HPI: Brenda Arnold is a 63 year old female with history of T2DM, PAF, ESRD- HD MWF, OA bilateral hips s/p R-THR and need of L-THR; who was admitted on 08/25/2019 with chest pain and abnormal EKG found to be secondary to pericarditis.  History taken from chart review and patient.  CT chest negative for aneurysm or PE.  She had an echocardiogram, which showed an ejection fraction of 60-65% with no pericardial effusion.  She was started on colchicine.  She later developed atrial fibrillation with RVR with heart rate of 160-190.  She was started on amiodarone on 08/07/2019.  She had worsening of hypotension with CP on 09/29, was found to have large pericardial effusion with concerns of tamponade.  She was taken to the OR for right VATS with pericardial window by CT surgery. Post procedure hypotension treated with IV albumin and recurrent A fib with tachycardia treated with amio bolus on 9/30.  Follow up echo of 10/2 showed small pericardial effusion lateral to LV without tamponade and moderate left lateral pleural effusion and pericardial tube d/c 10/3.  She did have NSVT  <15 beats with SOB with activity 10/05  but back in NSR --no BB due to low blood pressures.  Mild pleuritic pain and bouts of tachycardia with activity decreasing. Therapy ongoing --patient noted to have balance deficits as well as DOE and tachycardia with activity. CIR recommended due to functional decline.  Please see preadmission assessment from today as well.  Review of Systems  Constitutional: Negative for chills and fever.  HENT: Negative for hearing loss and tinnitus.   Eyes: Negative for blurred vision and double vision.  Respiratory: Negative for cough and shortness of breath.        DOE  Cardiovascular: Negative for chest pain, palpitations and leg swelling.  Gastrointestinal: Negative for constipation,  heartburn and nausea.  Genitourinary: Negative for dysuria and urgency.  Musculoskeletal: Positive for joint pain (left hip pain--was planning on getting it replaced this month. ). Negative for myalgias and neck pain.  Skin: Negative for itching and rash.  Neurological: Negative for dizziness, focal weakness and headaches.  Psychiatric/Behavioral: Negative for memory loss. The patient does not have insomnia.     Past Medical History:  Diagnosis Date  . Anemia   . Arthritis   . Asthma   . Complication of anesthesia    difficulty with getting oxygen saturation up  . Diabetes mellitus   . Dyslipidemia   . Epistaxis 11/05/2012  . ESRD (end stage renal disease) on dialysis [facility])    "MWF; [facility] R[facility]" (09/15/2018)  . History of nuclear stress test    Myoview 5/18: EF 68, no infarct or ischemia, low risk  . HTN (hypertension)   . Hyperlipidemia   . Hyperparathyroidism due to renal insufficiency ([place])   . Obesity    s/p panniculectomy  . Paroxysmal A-fib ([facility])    a. chronic coumadin;  b. 12/2009 Echo: EF 60-65%, Gr 1 DD.  Marland Kitchen PCOS (polycystic ovarian syndrome)   . Pericarditis 08/2019  . Pneumonia   . Seasonal allergies   . Sleep apnea    a. not using CPAP, last study  >8 yrs  . Vitamin D deficiency     Past Surgical History:  Procedure Laterality Date  . ABDOMINAL HYSTERECTOMY     with panniculctomy  . AV FISTULA PLACEMENT  09/02/2012  Procedure: ARTERIOVENOUS (AV) FISTULA CREATION;  Surgeon: Elam Dutch, MD;  Location: [facility] OR;  Service: Vascular;  Laterality: Left;  Creation of Left Radial-Cephalic Fistula   . BREAST SURGERY     Biopsy right breast  . COLONOSCOPY W/ BIOPSIES AND POLYPECTOMY    . DILATION AND CURETTAGE OF UTERUS    . KNEE ARTHROSCOPY Right   . REVISON OF ARTERIOVENOUS FISTULA Left 04/27/2014   Procedure: REVISON OF LEFT RADIAL-CEPHALIC ARTERIOVENOUS FISTULA;  Surgeon: Mal Misty, MD;  Location: [place];  Service: Vascular;  Laterality: Left;  . TOTAL  HIP ARTHROPLASTY Right 09/15/2018  . TOTAL HIP ARTHROPLASTY Right 09/15/2018   Procedure: RIGHT TOTAL HIP ARTHROPLASTY ANTERIOR APPROACH;  Surgeon: Leandrew Koyanagi, MD;  Location: [place];  Service: Orthopedics;  Laterality: Right;  . UVULOPLASTY    . VIDEO ASSISTED THORACOSCOPY (VATS)/WEDGE RESECTION Right 08/31/2019   Procedure: VIDEO ASSISTED THORACOSCOPY/ DRAINAGE OF PERICARDIAL EFFUSION/ PERICARDIAL WINDOW/ ABORTED PERICARDIOCENTESIS ;  Surgeon: Lajuana Matte, MD;  Location: [facility] OR;  Service: Thoracic;  Laterality: Right;    Family History  Problem Relation Age of Onset  . Lung cancer Father        died @ 25  . Hypertension Mother        alive @ 64  . Diabetes Brother   . Kidney disease Brother     Social History:  Single. Lives alone--sedentary due to bad left hip but independent with walker PTA.  Mother (83 but in great health) from [facility] to assist after discharge. Works full time from [employer]. She reports that she has never smoked. She has never used smokeless tobacco. She reports that she does not drink alcohol or use drugs.     Allergies  Allergen Reactions  . Iodine Shortness Of Breath  . Shellfish Allergy Shortness Of Breath  . Norvasc [Amlodipine] Swelling    SWELLING REACTION UNSPECIFIED     Medications Prior to Admission  Medication Sig Dispense Refill  . aspirin EC 81 MG tablet Take 1 tablet (81 mg total) by mouth 2 (two) times daily. (Patient not taking: Reported on 08/25/2019) 84 tablet 0  . atorvastatin (LIPITOR) 40 MG tablet Take 1 tablet (40 mg total) by mouth daily at 6 PM. (Patient not taking: Reported on 08/25/2019) 30 tablet 1  . bisacodyl (DULCOLAX) 10 MG suppository Place 10 mg rectally as needed for moderate constipation.    . calcitRIOL (ROCALTROL) 0.5 MCG capsule Take 3 capsules (1.5 mcg total) by mouth 3 (three) times a week. (Patient not taking: Reported on 08/25/2019) 90 capsule 0  . ELIQUIS 5 MG TABS tablet Take 5 mg by  mouth 2 (two) times daily.     Marland Kitchen ethyl chloride spray Apply 1 application topically See admin instructions. For dialysis  6  . gabapentin (NEURONTIN) 100 MG capsule Take 100 mg by mouth at bedtime.  3  . methocarbamol (ROBAXIN) 500 MG tablet Take 1 tablet (500 mg total) by mouth every 6 (six) hours as needed for muscle spasms. (Patient not taking: Reported on 08/25/2019) 30 tablet 2  . ondansetron (ZOFRAN) 4 MG tablet Take 1-2 tablets (4-8 mg total) by mouth every 8 (eight) hours as needed for nausea or vomiting. (Patient not taking: Reported on 08/25/2019) 40 tablet 0  . oxyCODONE (OXY IR/ROXICODONE) 5 MG immediate release tablet Take 1-3 tablets (5-15 mg total) by mouth every 4 (four) hours as needed. (Patient not taking: Reported on 08/25/2019) 30 tablet 0  . polycarbophil (FIBERCON)  625 MG tablet Take 625 mg by mouth daily.    . promethazine (PHENERGAN) 25 MG tablet Take 1 tablet (25 mg total) by mouth every 6 (six) hours as needed for nausea. (Patient not taking: Reported on 08/25/2019) 30 tablet 1  . senna-docusate (SENOKOT S) 8.6-50 MG tablet Take 1 tablet by mouth at bedtime as needed. (Patient not taking: Reported on 08/25/2019) 30 tablet 1  . sucroferric oxyhydroxide (VELPHORO) 500 MG chewable tablet Chew 2 capsules by mouth 4 (four) times daily -  with meals and at bedtime.       Drug Regimen Review  Drug regimen was reviewed and remains appropriate with no significant issues identified  Home: Home Living Family/patient expects to be discharged to:: Private residence Living Arrangements: Alone Available Help at Discharge: (13 year old Mom likely to come from [facility] to stay with her) Type of Home: House Home Access: Stairs to enter [employer] of Steps: 1 step up inside from the back Entrance Stairs-Rails: None Home Layout: One level Bathroom Shower/Tub: Chiropodist: Standard Bathroom Accessibility: Yes Home Equipment: Tub bench, Environmental consultant - 2 wheels,  Cane - single point, Financial controller: Sock aid  Lives With: Alone   Functional History: Prior Function Level of Independence: Independent with assistive device(s) Comments: Pt reports completing ADLs using RW for functional mobility, tub bench in the shower, sock aide for socks; drives to dialysis  Functional Status:  Mobility: Bed Mobility Overal bed mobility: Modified Independent Bed Mobility: Supine to Sit Supine to sit: Min guard General bed mobility comments: HOB elevated, increased time and effort, steadying assist only Transfers Overall transfer level: Needs assistance Equipment used: Rolling walker (2 wheeled) Transfers: Sit to/from Stand Sit to Stand: Min assist General transfer comment: min assist for safety/balance, cueing for hand placement  Ambulation/Gait Ambulation/Gait assistance: Min guard Gait Distance (Feet): 100 Feet Assistive device: (EVA walker) Gait Pattern/deviations: Step-through pattern General Gait Details: wide BOS, mild instability, kept in check by EVA walker.  Sats difficult to get during gait, but 02% on RA within 30-40 secs of sitting.  EHR 115 bpm with gait.  Dyspnea 1-2/4 Gait velocity: slower Gait velocity interpretation: <1.8 ft/sec, indicate of risk for recurrent falls    ADL: ADL Overall ADL's : Needs assistance/impaired Eating/Feeding: Independent, Sitting Grooming: Min guard, Standing Upper Body Bathing: Set up, Sitting Lower Body Bathing: Minimal assistance, Sit to/from stand Upper Body Dressing : Set up, Sitting Lower Body Dressing: Minimal assistance, Sit to/from stand Toilet Transfer: Minimal assistance, RW, Ambulation Toilet Transfer Details (indicate cue type and reason): 3:1 over commode Toileting- Clothing Manipulation and Hygiene: Min guard, Sit to/from stand Functional mobility during ADLs: Rolling walker, Minimal assistance General ADL Comments: fatigues easily during ADL  Cognition: Cognition  Overall Cognitive Status: Within Functional Limits for tasks assessed Orientation Level: Oriented X4 Cognition Arousal/Alertness: Awake/alert Behavior During Therapy: WFL for tasks assessed/performed Overall Cognitive Status: Within Functional Limits for tasks assessed   Blood pressure 92/67, pulse 91, temperature 98.9 F (37.2 C), temperature source Oral, resp. rate 18, height 5\' 6"  (1.676 m), weight 104 kg, SpO2 93 %. Physical Exam  Nursing note and vitals reviewed. Constitutional: She is oriented to person, place, and time. She appears well-developed.  Obese  HENT:  Head: Normocephalic and atraumatic.  Eyes: Pupils are equal, round, and reactive to light. EOM are normal. Right conjunctiva is injected.  Neck: No tracheal deviation present. No thyromegaly present.  Respiratory: Effort normal. No respiratory distress.  Right prior CT  suture under right breast.    GI: She exhibits no distension. There is no abdominal tenderness.  Musculoskeletal:        General: No edema.     Comments: Left wrist with graft  Neurological: She is alert and oriented to person, place, and time.  Motor: Grossly 5/5 throughout  Skin: Skin is warm and dry. No rash noted. No erythema.  Vascular changes b/l LE  Psychiatric: She has a normal mood and affect. Her behavior is normal.    Results for orders placed or performed during the hospital encounter of 08/25/19 (from the past 48 hour(s))  Glucose, capillary     Status: None   Collection Time: 09/07/19  4:46 PM  Result Value Ref Range   Glucose-Capillary 93 70 - 99 mg/dL  Glucose, capillary     Status: None   Collection Time: 09/07/19  9:18 PM  Result Value Ref Range   Glucose-Capillary 98 70 - 99 mg/dL   Comment 1 Notify RN    Comment 2 Document in Chart   Glucose, capillary     Status: None   Collection Time: 09/08/19  5:53 AM  Result Value Ref Range   Glucose-Capillary 81 70 - 99 mg/dL   Comment 1 Notify RN    Comment 2 Document in Chart    Basic metabolic panel     Status: Abnormal   Collection Time: 09/08/19  8:21 AM  Result Value Ref Range   Sodium 136 135 - 145 mmol/L   Potassium 3.8 3.5 - 5.1 mmol/L   Chloride 100 98 - 111 mmol/L   CO2 27 22 - 32 mmol/L   Glucose, Bld 121 (H) 70 - 99 mg/dL   BUN 36 (H) 8 - 23 mg/dL   Creatinine, Ser 9.77 (H) 0.44 - 1.00 mg/dL   Calcium 8.4 (L) 8.9 - 10.3 mg/dL   GFR calc non Af Amer 4 (L) >60 mL/min   GFR calc Af Amer 4 (L) >60 mL/min   Anion gap 9 5 - 15    Comment: Performed at [facility], [address] [name], [place] [address]  CBC     Status: Abnormal   Collection Time: 09/08/19  8:21 AM  Result Value Ref Range   WBC 14.9 (H) 4.0 - 10.5 K/uL   RBC 2.90 (L) 3.87 - 5.11 MIL/uL   Hemoglobin 9.3 (L) 12.0 - 15.0 g/dL   HCT 29.5 (L) 36.0 - 46.0 %   MCV 101.7 (H) 80.0 - 100.0 fL   MCH 32.1 26.0 - 34.0 pg   MCHC 31.5 30.0 - 36.0 g/dL   RDW 16.2 (H) 11.5 - 15.5 %   Platelets 342 150 - 400 K/uL   nRBC 0.0 0.0 - 0.2 %    Comment: Performed at [facility], [place] [address] [place], [place] [address]  Glucose, capillary     Status: None   Collection Time: 09/08/19 10:46 AM  Result Value Ref Range   Glucose-Capillary 88 70 - 99 mg/dL   Comment 1 Notify RN    Comment 2 Document in Chart   Glucose, capillary     Status: Abnormal   Collection Time: 09/08/19  9:19 PM  Result Value Ref Range   Glucose-Capillary 123 (H) 70 - 99 mg/dL   Comment 1 Notify RN    Comment 2 Document in Chart   CBC     Status: Abnormal   Collection Time: 09/09/19  2:26 AM  Result Value Ref Range  WBC 15.3 (H) 4.0 - 10.5 K/uL   RBC 2.93 (L) 3.87 - 5.11 MIL/uL   Hemoglobin 9.4 (L) 12.0 - 15.0 g/dL   HCT 29.4 (L) 36.0 - 46.0 %   MCV 100.3 (H) 80.0 - 100.0 fL   MCH 32.1 26.0 - 34.0 pg   MCHC 32.0 30.0 - 36.0 g/dL   RDW 15.9 (H) 11.5 - 15.5 %   Platelets 282 150 - 400 K/uL   nRBC 0.0 0.0 - 0.2 %    Comment: Performed at [facility], [place] [address], [place], [place] [id number]   Basic metabolic panel     Status: Abnormal   Collection Time: 09/09/19  2:26 AM  Result Value Ref Range   Sodium 134 (L) 135 - 145 mmol/L   Potassium 4.1 3.5 - 5.1 mmol/L   Chloride 95 (L) 98 - 111 mmol/L   CO2 24 22 - 32 mmol/L   Glucose, Bld 76 70 - 99 mg/dL   BUN 17 8 - 23 mg/dL   Creatinine, Ser 5.79 (H) 0.44 - 1.00 mg/dL    Comment: DELTA CHECK NOTED   Calcium 8.4 (L) 8.9 - 10.3 mg/dL   GFR calc non Af Amer 7 (L) >60 mL/min   GFR calc Af Amer 8 (L) >60 mL/min   Anion gap 15 5 - 15    Comment: Performed at [facility], [place] [address] [place], [place] [address]  Glucose, capillary     Status: None   Collection Time: 09/09/19  6:21 AM  Result Value Ref Range   Glucose-Capillary 78 70 - 99 mg/dL   Comment 1 Notify RN    Comment 2 Document in Chart    No results found.     Medical Problem List and Plan: 1.  Debility secondary to pericarditis with pericardial tamponade.   Admit to CIR 2.  Antithrombotics: -DVT/anticoagulation:  Pharmaceutical: Other (comment)--Eliquis  -antiplatelet therapy: N/A 3. Left hip pain/Pain Management:  On gabapentin 100 mg/hs. Tylenol prn. Will add lidocaine patch for pain control.   Monitor with increased mobility 4. Mood: Very motivated to work hard and get home. LCSW to follow for evaluation and support.  -antipsychotic agents: N/A 5. Neuropsych: This patient is capable of making decisions on her own behalf. 6. Skin/Wound Care: Routine pressure relief measures.  7. Fluids/Electrolytes/Nutrition: Strict I/O. 1200 cc FR/day. Labs with HP. 8. Pericarditis: On Colchicine MWF X 3 months. Monitor for agranulocytosis.   9. T2DM: Hgb A 1c- 4.9. Blood sugars tightly controlled on SSI.   Monitor with increased mobility 10. PAF: Now in NSR on amiodarone 200 mg bid--->taper to 200 mg daily starting 10/11.    Monitor with increased physical exertion 11. Chronic hypotension: Baseline SBP 80-90's range on midodrine tid. Reports can drop to 50's  with HD.   Monitor for signs and symptoms Ace wraps/teds/abdominal binder if necessary. 12.  ESRD: HD MWF at end of the day to help with tolerance of therapy.  13. Anemia of chronic disease: Being monitored. On weekly Aranesp.  Labs with HD. 14. Leucocytosis: Persistent--monitor for fevers and other signs of infection.  Labs with HD. 15.  Morbidly skin: Encouraged weight loss  Bary Leriche, PA-C 09/09/2019  I have personally performed a face to face diagnostic evaluation, including, but not limited to relevant history and physical exam findings, of this patient and developed relevant assessment and plan.  Additionally, I have reviewed and concur with the physician assistant's documentation above.  Ankit Posey Pronto,  MD, ABPMR  The patient's status has not changed. The original post admission physician evaluation remains appropriate, and any changes from the pre-admission screening or documentation from the acute chart are noted above.   Delice Lesch, MD, ABPMR

## 2019-09-09 NOTE — PMR Pre-admission (Signed)
PMR Admission Coordinator Pre-Admission Assessment  Patient: Brenda Arnold is an 63 y.o., female MRN: [id number] DOB: 1956/01/12 Height: '5\' 6"'  (167.6 cm) Weight: 104 kg  Insurance Information HMO:     PPO: yes     PCP:      IPA:      80/20:      OTHER:  PRIMARY: [employer] of [place]      Policy#: [id number]      Subscriber: pt CM Name: Brenda Arnold      Phone#: [phone]     Fax#: [phone] Pre-Cert#: [id number] updates due 10/14 to same CM and fax      Employer: [employer] Benefits:  Phone #: [phone]     Name: 09/08/2019 Eff. Date: 12/02/2018     Deduct: $1000/met      Out of Pocket Max: [phone] includes deductible and met      Life Max: none CIR: $435 co pay per admit then 80%      SNF: $435 co pay per admit then 80% Outpatient: 80%     Co-Pay: limited to 30 visits combined per year Home Health: 80%      Co-Pay: limited by medical neccesity DME: 80%     Co-Pay: 20% Providers: in network  SECONDARY: none       Medicaid Application Date:       Case Manager:  Disability Application Date:       Case Worker:   The "Data Collection Information Summary" for patients in Inpatient Rehabilitation Facilities with attached "Privacy Act [place] Records" was provided and verbally reviewed with: N/A  Emergency Contact Information Contact Information    Name Relation Home Work Mobile   Bryant,Brenda Arnold   [phone]   Brenda Arnold Mother   [phone]      Current Medical History  Patient Admitting Diagnosis: debility  History of Present Illness: 63 year old female with past medical history for ESRD on HD, PAF, DM 2, OSA who presented for the second time in 24 hrs on 08/25/2019 for evaluation of chest pain in the setting of dialysis. Initially chest pain felt secondary to pericarditis. Began colchicine during dialysis days. Echo revealed moderate pericardial effusion without signs of tamponade. Also had atrial fibrillation with RVR  and began IV amiodarone. She then developed cardiogenic shock and transferred to ICU on 08/31/2019. Found to have tamponade and underwent pericardial window with drain placement on 08/31/2019. Drain removed 09/04/2019.  To continue colchicine MWF for 3 months. Recommend CBC with platelets at least twice monthly on dialysis to ensure no agranulocytosis per nephrology. NO NSAIDS or aspirin. Follow up with Dr. Kipp Brood with CVTS after d/c.   Acute on chronic hypotension to continue midodrine. Bilateral pleural effusions likely related to pulmonary edema in setting of volume overload. Remains on room air. PAF with RVR to continue on amiodarone. Continue Eliquis. Follow up with Dr. Radford Pax, cardiology after d/c.  Patient's medical record from [facility]  has been reviewed by the rehabilitation admission coordinator and physician.  Past Medical History  Past Medical History:  Diagnosis Date  . Anemia   . Arthritis   . Asthma   . Complication of anesthesia    difficulty with getting oxygen saturation up  . Diabetes mellitus   . Dyslipidemia   . Epistaxis 11/05/2012  . ESRD (end stage renal disease) on dialysis [facility])    "MWF; Jeneen Rinks" (09/15/2018)  . History of nuclear stress test    Myoview 5/18: EF 68, no  infarct or ischemia, low risk  . HTN (hypertension)   . Hyperlipidemia   . Hyperparathyroidism due to renal insufficiency ([place])   . Obesity    s/p panniculectomy  . Paroxysmal A-fib ([facility]CC)    a. chronic coumadin;  b. 12/2009 Echo: EF 60-65%, Gr 1 DD.  Marland Kitchen PCOS (polycystic ovarian syndrome)   . Pericarditis 08/2019  . Pneumonia   . Seasonal allergies   . Sleep apnea    a. not using CPAP, last study  >8 yrs  . Vitamin D deficiency     Family History   family history includes Diabetes in her brother; Hypertension in her mother; Kidney disease in her brother; Lung cancer in her father.  Prior Rehab/Hospitalizations Has the patient had prior rehab or hospitalizations prior to  admission? Yes  Has the patient had major surgery during 100 days prior to admission? Yes   Current Medications  Current Facility-Administered Medications:  .  acetaminophen (TYLENOL) tablet 650 mg, 650 mg, Oral, Q6H PRN, 650 mg at 09/03/19 6967 **OR** acetaminophen (TYLENOL) suppository 650 mg, 650 mg, Rectal, Q6H PRN, Lars Pinks M, PA-C .  amiodarone (PACERONE) tablet 200 mg, 200 mg, Oral, BID, 200 mg at 09/09/19 0933 **FOLLOWED BY** [START ON 09/12/2019] amiodarone (PACERONE) tablet 200 mg, 200 mg, Oral, Daily, Donato Heinz, MD .  apixaban (ELIQUIS) tablet 5 mg, 5 mg, Oral, BID, Skeet Latch, MD, 5 mg at 09/09/19 0934 .  calcitRIOL (ROCALTROL) capsule 0.75 mcg, 0.75 mcg, Oral, Q M,W,F-HD, Lars Pinks M, PA-C, 0.75 mcg at 09/08/19 1841 .  Chlorhexidine Gluconate Cloth 2 % PADS 6 each, 6 each, Topical, Q0600, Roney Jaffe, MD, 6 each at 09/09/19 [phone] .  colchicine tablet 0.3 mg, 0.3 mg, Oral, Q M,W,F-HD, Lars Pinks M, PA-C, 0.3 mg at 09/08/19 1209 .  [START ON 09/10/2019] Darbepoetin Alfa (ARANESP) injection 60 mcg, 60 mcg, Subcutaneous, Q Fri-1800, Roney Jaffe, MD .  docusate sodium (COLACE) capsule 100 mg, 100 mg, Oral, BID, Kyle, Tyrone A, DO, 100 mg at 09/09/19 0934 .  gabapentin (NEURONTIN) capsule 100 mg, 100 mg, Oral, QHS, Lars Pinks M, PA-C, 100 mg at 09/08/19 2136 .  HYDROmorphone (DILAUDID) injection 0.5-1 mg, 0.5-1 mg, Intravenous, Q6H PRN, Lars Pinks M, PA-C, 1 mg at 09/02/19 1707 .  insulin aspart (novoLOG) injection 0-9 Units, 0-9 Units, Subcutaneous, TID WC, Frederik Pear, MD, Stopped at 09/08/19 0756 .  MEDLINE mouth rinse, 15 mL, Mouth Rinse, BID, Lightfoot, Harrell O, MD, 15 mL at 09/09/19 0935 .  menthol-cetylpyridinium (CEPACOL) lozenge 3 mg, 1 lozenge, Oral, PRN, Nani Skillern, PA-C, 3 mg at 08/29/19 1203 .  midodrine (PROAMATINE) tablet 10 mg, 10 mg, Oral, TID WC, Zimmerman, Donielle M, PA-C, 10  mg at 09/09/19 1212 .  multivitamin (RENA-VIT) tablet 1 tablet, 1 tablet, Oral, QHS, Lars Pinks M, PA-C, 1 tablet at 09/08/19 2135 .  ondansetron (ZOFRAN) injection 4 mg, 4 mg, Intravenous, Q6H PRN, Lars Pinks M, PA-C, 4 mg at 08/31/19 1654 .  senna-docusate (Senokot-S) tablet 2 tablet, 2 tablet, Oral, QHS PRN, Nani Skillern, PA-C, 2 tablet at 08/31/19 0037 .  sucroferric oxyhydroxide (VELPHORO) chewable tablet 1,000 mg, 1,000 mg, Oral, TID WC & HS, Lars Pinks M, PA-C, 1,000 mg at 09/09/19 1212 .  traMADol (ULTRAM) tablet 50 mg, 50 mg, Oral, Q12H PRN, Lars Pinks M, PA-C, 50 mg at 09/03/19 1017  Patients Current Diet:  Diet Order            Diet renal/carb modified  with fluid restriction Diet-HS Snack? Nothing; Fluid restriction: 1200 mL Fluid; Room service appropriate? Yes; Fluid consistency: Thin  Diet effective now              Precautions / Restrictions Precautions Precautions: Fall Precaution Comments: R chest tube Restrictions Weight Bearing Restrictions: No   Has the patient had 2 or more falls or a fall with injury in the past year? No  Prior Activity Level Community (5-7x/wk): independent; driving, works fulltime remotely medicare related   Prior Functional Level Self Care: Did the patient need help bathing, dressing, using the toilet or eating? Independent  Indoor Mobility: Did the patient need assistance with walking from room to room (with or without device)? Independent  Stairs: Did the patient need assistance with internal or external stairs (with or without device)? Independent  Functional Cognition: Did the patient need help planning regular tasks such as shopping or remembering to take medications? [place] / Equipment Home Assistive Devices/Equipment: Eyeglasses, Radio producer (specify quad or straight), Walker (specify type) Home Equipment: Tub bench, Walker - 2 wheels, Cane - single point,  Adaptive equipment  Prior Device Use: Indicate devices/aids used by the patient prior to current illness, exacerbation or injury? Walker  Current Functional Level Cognition  Overall Cognitive Status: Within Functional Limits for tasks assessed Orientation Level: Oriented X4    Extremity Assessment (includes Sensation/Coordination)  Upper Extremity Assessment: Generalized weakness  Lower Extremity Assessment: Defer to PT evaluation    ADLs  Overall ADL's : Needs assistance/impaired Eating/Feeding: Independent, Sitting Grooming: Min guard, Standing Upper Body Bathing: Set up, Sitting Lower Body Bathing: Minimal assistance, Sit to/from stand Upper Body Dressing : Set up, Sitting Lower Body Dressing: Minimal assistance, Sit to/from stand Toilet Transfer: Minimal assistance, RW, Ambulation Toilet Transfer Details (indicate cue type and reason): 3:1 over commode Toileting- Clothing Manipulation and Hygiene: Min guard, Sit to/from stand Functional mobility during ADLs: Rolling walker, Minimal assistance General ADL Comments: fatigues easily during ADL    Mobility  Overal bed mobility: Modified Independent Bed Mobility: Supine to Sit Supine to sit: Min guard General bed mobility comments: HOB elevated, increased time and effort, steadying assist only    Transfers  Overall transfer level: Needs assistance Equipment used: Rolling walker (2 wheeled) Transfers: Sit to/from Stand Sit to Stand: Min assist General transfer comment: min assist for safety/balance, cueing for hand placement     Ambulation / Gait / Stairs / Wheelchair Mobility  Ambulation/Gait Ambulation/Gait assistance: Counsellor (Feet): 100 Feet Assistive device: (EVA walker) Gait Pattern/deviations: Step-through pattern General Gait Details: wide BOS, mild instability, kept in check by EVA walker.  Sats difficult to get during gait, but 02% on RA within 30-40 secs of sitting.  EHR 115 bpm with gait.   Dyspnea 1-2/4 Gait velocity: slower Gait velocity interpretation: <1.8 ft/sec, indicate of risk for recurrent falls    Posture / Balance Dynamic Sitting Balance Sitting balance - Comments: bracing due to pain Balance Overall balance assessment: Needs assistance Sitting-balance support: Feet supported Sitting balance-Leahy Scale: Good Sitting balance - Comments: bracing due to pain Standing balance support: Bilateral upper extremity supported, During functional activity, Single extremity supported Standing balance-Leahy Scale: Fair Standing balance comment: pt reliant on BUE support dynamically, able to stand during ADls with 0-1 support but preference to B support    Special needs/care consideration BiPAP/CPAP yes CPM  N/a Continuous Drip IV n/a Dialysis MWF [address] 0530 am drives self to hemodialysis Life Vest  N/a Oxygen  N/a Special Bed  N/a Trach Size  N/a Wound Vac n/a Skin Left forearm fistula; right chest dressing 1.5 cm x 1 cm Bowel mgmt: LBM 10/7 continent Bladder mgmt:  anuric Diabetic mgmt: n/a Behavioral consideration  N/a Chemo/radiation  N/a Designated visitor TBD   Previous Home Environment  Living Arrangements: Alone  Lives With: Alone Available Help at Discharge: (66 year old Mom likely to come from [facility] to stay with her) Type of Home: [place]: One level Home Access: Stairs to enter Entrance Stairs-Rails: None Entrance Stairs-Number of Steps: 1 step up inside from the back Bathroom Shower/Tub: Chiropodist: Standard Bathroom Accessibility: Yes How Accessible: Accessible via walker [place]: No  Discharge Living Setting Plans for Discharge Living Setting: Patient's home, Alone Type of Home at Discharge: House Discharge Home Layout: One level Discharge Home Access: Stairs to enter Entrance Stairs-Rails: None Entrance Stairs-Number of Steps: 1 step up from back door Discharge Bathroom Shower/Tub:  Tub/shower unit Discharge Bathroom Toilet: Standard Discharge Bathroom Accessibility: Yes How Accessible: Accessible via walker Does the patient have any problems obtaining your medications?: No  Social/Family/Support Systems Patient Roles: (fulltime employee) Contact Information: Dell Ponto, Arnold Anticipated Caregiver: Leeann Must to come from [place]: see above Ability/Limitations of Caregiver: none Caregiver Availability: 24/7 Discharge Plan Discussed with Primary Caregiver: Yes Is Caregiver In Agreement with Plan?: Yes Does Caregiver/Family have Issues with Lodging/Transportation while Pt is in Rehab?: No  Goals/Additional Needs Patient/Family Goal for Rehab: Mod I to supervision with PT and OT Expected length of stay: ELOS 10 to 12 days Special Service Needs: Hemodialysis MWF Pt/Family Agrees to Admission and willing to participate: Yes Program Orientation Provided & Reviewed with Pt/Caregiver Including Roles  & Responsibilities: Yes  Decrease burden of Care through IP rehab admission: n/a  Possible need for SNF placement upon discharge: not anticipated  Patient Condition: I have reviewed medical records from [facility] , spoken with  patient. I met with patient at the bedside for inpatient rehabilitation assessment.  Patient will benefit from ongoing PT and OT, can actively participate in 3 hours of therapy a day 5 days of the week, and can make measurable gains during the admission.  Patient will also benefit from the coordinated team approach during an Inpatient Acute Rehabilitation admission.  The patient will receive intensive therapy as well as Rehabilitation physician, nursing, social worker, and care management interventions.  Due to bladder management, bowel management, safety, skin/wound care, disease management, medication administration, pain management and patient education the patient requires 24 hour a day  rehabilitation nursing.  The patient is currently min to mod assist with mobility and basic ADLs.  Discharge setting and therapy post discharge at home with home health is anticipated.  Patient has agreed to participate in the Acute Inpatient Rehabilitation Program and will admit today.  Preadmission Screen Completed By:  Cleatrice Burke, 09/09/2019 2:12 PM ______________________________________________________________________   Discussed status with Dr. Posey Pronto  on 09/09/2019 at  1430 and received approval for admission today.  Admission Coordinator:  Cleatrice Burke, RN, time  7048 Date  09/09/2019   Assessment/Plan: Diagnosis: Debility 1. Does the need for close, 24 hr/day Medical supervision in concert with the patient's rehab needs make it unreasonable for this patient to be served in a less intensive setting? Potentially 2. Co-Morbidities requiring supervision/potential complications: ESRD on HD, PAF, DM 2, OSA  3. Due to safety, skin/wound care, disease management, pain management and patient education,  does the patient require 24 hr/day rehab nursing? Potentially 4. Does the patient require coordinated care of a physician, rehab nurse, PT, OT, and SLP to address physical and functional deficits in the context of the above medical diagnosis(es)? Potentially Addressing deficits in the following areas: balance, endurance, locomotion, transferring, toileting and psychosocial support 5. Can the patient actively participate in an intensive therapy program of at least 3 hrs of therapy 5 days a week? Yes 6. The potential for patient to make measurable gains while on inpatient rehab is good 7. Anticipated functional outcomes upon discharge from inpatient rehab: modified independent PT, modified independent OT, n/a SLP 8. Estimated rehab length of stay to reach the above functional goals is: 4-6 days. 9. Anticipated discharge destination: Home 10. Overall Rehab/Functional Prognosis:  good   MD Signature: Delice Lesch, MD, ABPMR

## 2019-09-09 NOTE — Progress Notes (Signed)
Inpatient Rehabilitation  Patient information reviewed and entered into eRehab system by Lyniah Fujita M. Donny Heffern, M.A., CCC/SLP, PPS Coordinator.  Information including medical coding, functional ability and quality indicators will be reviewed and updated through discharge.    

## 2019-09-09 NOTE — Discharge Summary (Signed)
Physician Discharge Summary  Brenda Arnold [id number] DOB: [birth year] DOA: 08/25/2019  PCP: Kelton Pillar, MD  Admit date: 08/25/2019 Discharge date: 09/09/2019  Admitted From: Home Disposition:  CIR   Discharge Condition: Stable CODE STATUS: Full code Diet recommendation: Renal/carb modified  Brief/Interim Summary: Brenda Arnold is a 63 year old female with ESRD on dialysis MWF, OSA, paroxysmal atrial fibrillation who presented to the hospital on 9/23 with chest pain, hypotension in setting of dialysis. Initially, her chest pain was thought to be secondary to pericarditis.  She was started on colchicine during days on dialysis.  Echocardiogram revealed moderate pericardial effusion without signs of tamponade.  She also went into atrial fibrillation with RVR, started on IV amiodarone.  She subsequently developed cardiogenic shock and was transferred to ICU on 9/29.  She was found to have cardiac tamponade, underwent pericardial window with drain placement on 9/29.  Drain removed 10/3.    Discharge Diagnoses:  Principal Problem:   Cardiac tamponade Active Problems:   Type 2 diabetes mellitus with complication, without long-term current use of insulin (HCC)   OBESITY   OBESITY-MORBID (>100')   Chest pain   OSA (obstructive sleep apnea)   Hypotension   ESRD on hemodialysis (HCC)   CAD (coronary artery disease)   Tachycardia   Pericarditis   Cardiac tamponade, pericardial effusion, acute pericarditis -Found to be in cardiac tamponade w/ pericardial effusion, status post pericardial window on 9/29.  Drain removed 10/3 -Continue colchicine MWF for 3 months.  Will need CBC with platelets at least twice monthly on dialysis to ensure no agranulocytosis, per nephrology  -No NSAIDs, aspirin -Follow-up with Dr. Kipp Brood, cardiothoracic surgery after discharge from CIR  Acute on chronic hypotension  -Continue midodrine  Bilateral pleural effusions likely related to  pulmonary edema in setting of volume overload -Improved.  Remains on room air without any respiratory distress  Paroxysmal atrial fibrillation, A. fib RVR -Per cards: Continue amiodarone for rhythm control. Amio 400 mg BID x 1 week (thru 10/3) and then 200 mg BID for 1 week then 200 mg daily -Continue eliquis -Follow-up with Dr. Radford Pax, cardiology after discharge from [place]  ESRD -MWF dialysis per Nephrology  Deconditioning Debility -CIR    Discharge Instructions  Discharge Instructions    Increase activity slowly   Complete by: As directed      Allergies as of 09/09/2019      Reactions   Iodine Shortness Of Breath   Shellfish Allergy Shortness Of Breath   Norvasc [amlodipine] Swelling   SWELLING REACTION UNSPECIFIED       Medication List    STOP taking these medications   aspirin EC 81 MG tablet   atorvastatin 40 MG tablet Commonly known as: LIPITOR   methocarbamol 500 MG tablet Commonly known as: ROBAXIN   ondansetron 4 MG tablet Commonly known as: ZOFRAN   oxyCODONE 5 MG immediate release tablet Commonly known as: Oxy IR/ROXICODONE   promethazine 25 MG tablet Commonly known as: PHENERGAN   senna-docusate 8.6-50 MG tablet Commonly known as: Senokot S     TAKE these medications   amiodarone 200 MG tablet Commonly known as: PACERONE Take 1 tablet (200 mg total) by mouth 2 (two) times daily.   amiodarone 200 MG tablet Commonly known as: PACERONE Take 1 tablet (200 mg total) by mouth daily. Start taking on: September 12, 2019   bisacodyl 10 MG suppository Commonly known as: DULCOLAX Place 10 mg rectally as needed for moderate constipation.   calcitRIOL 0.25 MCG capsule Commonly  known as: ROCALTROL Take 3 capsules (0.75 mcg total) by mouth every Monday, Wednesday, and Friday with hemodialysis. Start taking on: September 10, 2019 What changed:   medication strength  how much to take  when to take this   colchicine 0.6 MG tablet Take 0.5  tablets (0.3 mg total) by mouth every Monday, Wednesday, and Friday with hemodialysis. Start taking on: September 10, 2019   Eliquis 5 MG Tabs tablet Generic drug: apixaban Take 5 mg by mouth 2 (two) times daily.   ethyl chloride spray Apply 1 application topically See admin instructions. For dialysis   gabapentin 100 MG capsule Commonly known as: NEURONTIN Take 100 mg by mouth at bedtime.   midodrine 10 MG tablet Commonly known as: PROAMATINE Take 1 tablet (10 mg total) by mouth 3 (three) times daily with meals.   polycarbophil 625 MG tablet Commonly known as: FIBERCON Take 625 mg by mouth daily.   Velphoro 500 MG chewable tablet Generic drug: sucroferric oxyhydroxide Chew 2 capsules by mouth 4 (four) times daily -  with meals and at bedtime.      Follow-up Information    Lajuana Matte, MD. Go on 09/10/2019.   Specialty: Thoracic Surgery Why: Appointment time is at 1:15 pm Contact information: [address] [place] [address] [phone]          Allergies  Allergen Reactions  . Iodine Shortness Of Breath  . Shellfish Allergy Shortness Of Breath  . Norvasc [Amlodipine] Swelling    SWELLING REACTION UNSPECIFIED     Consultations:  PCCM  Cardiology  Cardiothoracic surgery  Nephrology   Procedures/Studies: Dg Chest Port 1 View  Result Date: 09/07/2019 CLINICAL DATA:  Chest tube removed 2 days ago, some shortness of breath EXAM: PORTABLE CHEST 1 VIEW COMPARISON:  Portable exam 0640 hours compared to 09/05/2019 FINDINGS: RIGHT jugular line with tip projecting over SVC. LEFT heart border obscured by moderate-sized LEFT pleural effusion and basilar atelectasis. Small RIGHT pleural effusion and basilar atelectasis. Perihilar vascular congestion and peribronchial thickening present with probable perihilar edema. Atherosclerotic calcification aorta. No pneumothorax. IMPRESSION: BILATERAL pleural effusions and basilar atelectasis, moderate LEFT  and small RIGHT. Enlargement of cardiac silhouette with pulmonary vascular congestion and probable mild pulmonary edema. Electronically Signed   By: Lavonia Dana M.D.   On: 09/07/2019 09:39   Dg Chest Port 1 View  Result Date: 09/05/2019 CLINICAL DATA:  Respiratory status, chest tube. EXAM: PORTABLE CHEST 1 VIEW COMPARISON:  09/04/2019 FINDINGS: Right IJ central venous catheter unchanged. Lungs are adequately inflated demonstrate no change in a moderate size left effusion likely with associated basilar atelectasis. No change small right effusion with associated basilar atelectasis. Mild hazy prominence of the perihilar vessels suggesting a component of interstitial edema. Remainder of the exam is unchanged. IMPRESSION: Findings suggesting mild interstitial edema with stable moderate size left effusion and stable small right effusion with associated bibasilar atelectasis. Infection in the mid to lower lungs is possible. Electronically Signed   By: Marin Olp M.D.   On: 09/05/2019 09:06   Dg Chest Port 1 View  Result Date: 09/04/2019 CLINICAL DATA:  Pneumothorax. EXAM: PORTABLE CHEST 1 VIEW COMPARISON:  09/02/2019 FINDINGS: The right-sided central venous catheter is well position. There is no pneumothorax. Again noted is a moderate to large left-sided pleural effusion with adjacent airspace disease which may represent compressive atelectasis. There is a right-sided chest tube in place. There is a small to moderate size right-sided pleural effusion prominent interstitial lung markings  are noted. The heart size remains unchanged. Aortic calcifications are noted. IMPRESSION: 1. Lines and tubes as above. 2. Moderate to large left-sided pleural effusion with adjacent presumed compressive atelectasis. 3. Small to moderate right-sided pleural effusion with adjacent atelectasis. Electronically Signed   By: Constance Holster M.D.   On: 09/04/2019 09:34   Dg Chest Port 1 View  Result Date: 09/02/2019 CLINICAL  DATA:  Right-sided chest tube placement EXAM: PORTABLE CHEST 1 VIEW COMPARISON:  09/01/2019 FINDINGS: Moderate left pleural effusion. Small right pleural effusion. Right basilar airspace disease likely reflecting atelectasis. No pneumothorax. Stable cardiomediastinal silhouette. Right jugular central venous catheter with the tip projecting over the SVC. Thoracic aortic atherosclerosis. No aggressive osseous lesion. IMPRESSION: Small right pleural effusion. Right-sided chest tube in unchanged position without a pneumothorax. Moderate-sized left pleural effusion. Electronically Signed   By: Kathreen Devoid   On: 09/02/2019 08:34   Dg Chest Port 1 View  Result Date: 09/01/2019 CLINICAL DATA:  Pericardial effusion EXAM: PORTABLE CHEST 1 VIEW COMPARISON:  08/31/2019 FINDINGS: Large left pleural effusion is increased since prior study. Small layering right pleural effusion. Bilateral lower lobe airspace opacities, stable on the right and increasing on the left. Right central line is unchanged. IMPRESSION: Worsening left pleural effusion and left lower lobe airspace disease. Stable small right effusion and right lower lobe atelectasis or infiltrate. Electronically Signed   By: Rolm Baptise M.D.   On: 09/01/2019 09:26   Dg Chest Port 1 View  Result Date: 08/31/2019 CLINICAL DATA:  Evaluate for pneumothorax. EXAM: PORTABLE CHEST 1 VIEW COMPARISON:  08/25/2019 FINDINGS: Interval placement of right IJ catheter. The tip of the catheter is at the distal SVC. No pneumothorax identified. Cardiac enlargement and aortic atherosclerosis. Bilateral pleural effusions are noted. The right pleural effusion appears increased in volume from previous exam. IMPRESSION: 1. No pneumothorax after right IJ catheter placement. 2. Increase in volume of right pleural effusion. Stable left effusion. Electronically Signed   By: Kerby Moors M.D.   On: 08/31/2019 15:43   Dg Chest Port 1 View  Result Date: 08/25/2019 CLINICAL DATA:   63 year old female with history of chest pain and palpitations. EXAM: PORTABLE CHEST 1 VIEW COMPARISON:  Chest x-ray 08/24/2019. FINDINGS: Lung volumes are low. Bibasilar opacities which may reflect areas of atelectasis and/or consolidation. Small bilateral pleural effusions. No evidence of pulmonary edema. Heart size is normal. Upper mediastinal contours are within normal limits. Aortic atherosclerosis. IMPRESSION: 1. Low lung volumes with bibasilar areas of atelectasis and/or consolidation and small bilateral pleural effusions. Electronically Signed   By: Vinnie Langton M.D.   On: 08/25/2019 10:19   Dg Chest Port 1 View  Result Date: 08/24/2019 CLINICAL DATA:  Pt has generalized CP, no radiation since yesterday. Pt reports it hurts to lay back or take a deep breath. Dialysis patient EXAM: PORTABLE CHEST - 1 VIEW COMPARISON:  11/03/2018 FINDINGS: Coarse bilateral infrahilar and bibasilar pulmonary opacities. No overt edema. Heart size normal.  Aortic Atherosclerosis (ICD10-170.0). No definite effusion. No pneumothorax. Visualized bones unremarkable. IMPRESSION: Coarse chronic-appearing bilateral infrahilar and basilar opacities. Electronically Signed   By: Lucrezia Europe M.D.   On: 08/24/2019 18:46   Ct Angio Chest/abd/pel For Dissection W And/or Wo Contrast  Result Date: 08/24/2019 CLINICAL DATA:  63 year old female with chest pain. Concern for aortic dissection. EXAM: CT ANGIOGRAPHY CHEST, ABDOMEN AND PELVIS TECHNIQUE: Multidetector CT imaging through the chest, abdomen and pelvis was performed using the standard protocol during bolus administration of intravenous contrast. Multiplanar reconstructed images  and MIPs were obtained and reviewed to evaluate the vascular anatomy. CONTRAST:  148mL OMNIPAQUE IOHEXOL 350 MG/ML SOLN COMPARISON:  CT angiography of the chest dated 11/04/2018. FINDINGS: CTA CHEST FINDINGS Cardiovascular: There is mild cardiomegaly. No pericardial effusion. Advanced multi vessel  coronary vascular calcification noted. There is mild atherosclerotic calcification of the thoracic aorta. No aneurysmal dilatation or dissection. The central pulmonary arteries appear patent as visualized. Mediastinum/Nodes: No hilar or mediastinal adenopathy. The esophagus is grossly unremarkable. Small bilateral thyroid nodules noted. Bilateral posterior exophytic thyroid nodules measure up to 11 mm on the left which may represent exophytic thyroid nodules versus parathyroid adenoma. Ultrasound may provide better evaluation on a nonemergent basis. No mediastinal fluid collection. Lungs/Pleura: There are bibasilar linear and streaky densities most consistent with atelectatic changes. Developing infiltrate is less likely. Clinical correlation is recommended. There is a subcentimeter left upper lobe calcified granuloma. There is no pleural effusion pneumothorax. The central airways are patent. Musculoskeletal: Degenerative changes of the spine and osteophyte. No acute osseous pathology. Review of the MIP images confirms the above findings. CTA ABDOMEN AND PELVIS FINDINGS VASCULAR Aorta: Mild atherosclerotic calcification. No dissection or aneurysm. Celiac: Patent without evidence of aneurysm, dissection, vasculitis or significant stenosis. SMA: Patent without evidence of aneurysm, dissection, vasculitis or significant stenosis. Renals: There is advanced atherosclerotic calcification of the origins of the renal arteries with decreased perfusion in the kidneys. IMA: Patent without evidence of aneurysm, dissection, vasculitis or significant stenosis. Inflow: Mild atherosclerotic calcification. No aneurysm or dissection. Veins: No obvious venous abnormality within the limitations of this arterial phase study. Review of the MIP images confirms the above findings. NON-VASCULAR No intra-abdominal free air or free fluid. Hepatobiliary: Slight irregularity of the liver contour may represent early changes of cirrhosis.  Clinical correlation is recommended. No intrahepatic biliary ductal dilatation. The gallbladder is unremarkable. Pancreas: Unremarkable. No pancreatic ductal dilatation or surrounding inflammatory changes. Spleen: Normal in size without focal abnormality. Adrenals/Urinary Tract: Left adrenal thickening/hyperplasia. The right adrenal gland is suboptimally visualized. Moderate to severe bilateral renal parenchyma atrophy, likely related to chronic kidney disease. No hydronephrosis or nephrolithiasis. Low attenuating bilateral renal lesions measure up to 6 cm on the upper pole of the right kidney. The larger lesions demonstrate fluid attenuation most consistent with cysts and the smaller lesions are suboptimally characterized. The visualized ureters appear unremarkable. The urinary bladder is collapsed. Stomach/Bowel: There is no bowel obstruction or active inflammation. The appendix is normal. Lymphatic: No adenopathy. Reproductive: Hysterectomy. Other: None Musculoskeletal: Degenerative changes of the spine. Severe arthritic changes of the left hip. Right hip arthroplasty. No acute osseous pathology. Review of the MIP images confirms the above findings. IMPRESSION: 1. No acute intrathoracic, abdominal, or pelvic pathology. No CT evidence of aortic dissection or aneurysm. 2. Advanced coronary vascular calcification and mild cardiomegaly. 3. Bibasilar atelectatic changes versus less likely infiltrate. Clinical correlation is recommended. 4. Bilateral thyroid nodules. Ultrasound may provide better evaluation on a nonemergent basis. 5. Moderate to severe bilateral renal parenchyma atrophy, likely related to chronic kidney disease. 6. No bowel obstruction or active inflammation. Normal appendix. 7. Cirrhosis. Aortic Atherosclerosis (ICD10-I70.0). Electronically Signed   By: Anner Crete M.D.   On: 08/24/2019 23:56      Discharge Exam: Vitals:   09/09/19 0821 09/09/19 1218  BP:    Pulse: 91   Resp:    Temp:  98.4 F (36.9 C) 98.9 F (37.2 C)  SpO2: 93%     General: Pt is alert, awake, not in acute distress  Cardiovascular: RRR, S1/S2 +, no edema Respiratory: CTA bilaterally, no wheezing, no rhonchi, no respiratory distress, no conversational dyspnea  Abdominal: Soft, NT, ND, bowel sounds + Extremities: no edema, no cyanosis Psych: Normal mood and affect, stable judgement and insight     The results of significant diagnostics from this hospitalization (including imaging, microbiology, ancillary and laboratory) are listed below for reference.     Microbiology: Recent Results (from the past 240 hour(s))  Surgical PCR screen     Status: None   Collection Time: 08/31/19 11:26 AM   Specimen: Nasal Swab  Result Value Ref Range Status   MRSA, PCR NEGATIVE NEGATIVE Final   Staphylococcus aureus NEGATIVE NEGATIVE Final    Comment: (NOTE) The Xpert SA Assay (FDA approved for NASAL specimens in patients 40 years of age and older), is one component of a comprehensive surveillance program. It is not intended to diagnose infection nor to guide or monitor treatment. Performed at [facility], [place] [address], [place], [place] [address]   Aerobic/Anaerobic Culture (surgical/deep wound)     Status: None   Collection Time: 08/31/19  2:17 PM   Specimen: PATH Cytology Misc. fluid; Body Fluid  Result Value Ref Range Status   Specimen Description PERICARDIAL  Final   Special Requests NONE  Final   Gram Stain   Final    MODERATE WBC PRESENT,BOTH PMN AND MONONUCLEAR NO ORGANISMS SEEN    Culture   Final    No growth aerobically or anaerobically. Performed at [facility], [place] [address] [place], [place] [address]    Report Status 09/05/2019 FINAL  Final  Aerobic/Anaerobic Culture (surgical/deep wound)     Status: None   Collection Time: 08/31/19  2:29 PM   Specimen: Pericardial; Tissue  Result Value Ref Range Status   Specimen Description PERICARDIAL  Final   Special Requests NONE   Final   Gram Stain   Final    FEW WBC PRESENT, PREDOMINANTLY MONONUCLEAR NO ORGANISMS SEEN    Culture   Final    No growth aerobically or anaerobically. Performed at [facility], [place] [address] [place], [place] [address]    Report Status 09/05/2019 FINAL  Final     Labs: BNP (last 3 results) No results for input(s): BNP in the last 8760 hours. Basic Metabolic Panel: Recent Labs  Lab 09/03/19 1047 09/04/19 0422 09/05/19 [id number] 09/06/19 0321 09/06/19 0322 09/07/19 0404 09/08/19 0821 09/09/19 0226  NA 136 137 137  --  138 138 136 134*  K 4.1 4.0 4.0  --  5.0 3.8 3.8 4.1  CL 99 98 100  --  102 101 100 95*  CO2 26 29 25   --  25 26 27 24   GLUCOSE 110* 90 98  --  90 85 121* 76  BUN 30* 19 31*  --  42* 22 36* 17  CREATININE 8.74* 6.81* 9.05*  --  10.41* 7.19* 9.77* 5.79*  CALCIUM 8.1* 8.2* 8.3*  --  7.4* 8.3* 8.4* 8.4*  MG 1.9 1.9 2.0 1.8  --  2.0  --   --   PHOS 3.3 2.4* 3.0  --  2.5 2.0*  --   --    Liver Function Tests: Recent Labs  Lab 09/03/19 1047 09/04/19 0422 09/05/19 0906 09/06/19 0322 09/07/19 0404  ALBUMIN 2.4* 2.3* 2.2* 2.2* 2.2*   No results for input(s): LIPASE, AMYLASE in the last 168 hours. No results for input(s): AMMONIA in the last 168 hours. CBC: Recent  Labs  Lab 09/03/19 1047 09/04/19 0422 09/05/19 0906 09/06/19 0321 09/07/19 0404 09/08/19 0821 09/09/19 0226  WBC 11.2* 12.7* 13.9* 15.4* 13.7* 14.9* 15.3*  NEUTROABS 8.7* 9.4* 10.5* 11.4* 9.7*  --   --   HGB 9.4* 9.7* 9.8* 9.8* 9.0* 9.3* 9.4*  HCT 29.6* 30.4* 30.4* 30.2* 29.2* 29.5* 29.4*  MCV 101.7* 101.0* 102.7* 101.0* 101.7* 101.7* 100.3*  PLT 356 350 320 355 298 342 282   Cardiac Enzymes: No results for input(s): CKTOTAL, CKMB, CKMBINDEX, TROPONINI in the last 168 hours. BNP: Invalid input(s): POCBNP CBG: Recent Labs  Lab 09/07/19 2118 09/08/19 0553 09/08/19 1046 09/08/19 2119 09/09/19 0621  GLUCAP 98 81 88 123* 78   D-Dimer No results for input(s): DDIMER in the  last 72 hours. Hgb A1c No results for input(s): HGBA1C in the last 72 hours. Lipid Profile No results for input(s): CHOL, HDL, LDLCALC, TRIG, CHOLHDL, LDLDIRECT in the last 72 hours. Thyroid function studies No results for input(s): TSH, T4TOTAL, T3FREE, THYROIDAB in the last 72 hours.  Invalid input(s): FREET3 Anemia work up No results for input(s): VITAMINB12, FOLATE, FERRITIN, TIBC, IRON, RETICCTPCT in the last 72 hours. Urinalysis    Component Value Date/Time   COLORURINE YELLOW 11/05/2012 2354   APPEARANCEUR CLEAR 11/05/2012 2354   LABSPEC 1.016 11/05/2012 2354   PHURINE 5.5 11/05/2012 2354   GLUCOSEU NEGATIVE 11/05/2012 2354   HGBUR SMALL (A) 11/05/2012 2354   BILIRUBINUR NEGATIVE 11/05/2012 2354   KETONESUR NEGATIVE 11/05/2012 2354   PROTEINUR 100 (A) 11/05/2012 2354   UROBILINOGEN 0.2 11/05/2012 2354   NITRITE NEGATIVE 11/05/2012 2354   LEUKOCYTESUR NEGATIVE 11/05/2012 2354   Sepsis Labs Invalid input(s): PROCALCITONIN,  WBC,  LACTICIDVEN Microbiology Recent Results (from the past 240 hour(s))  Surgical PCR screen     Status: None   Collection Time: 08/31/19 11:26 AM   Specimen: Nasal Swab  Result Value Ref Range Status   MRSA, PCR NEGATIVE NEGATIVE Final   Staphylococcus aureus NEGATIVE NEGATIVE Final    Comment: (NOTE) The Xpert SA Assay (FDA approved for NASAL specimens in patients 25 years of age and older), is one component of a comprehensive surveillance program. It is not intended to diagnose infection nor to guide or monitor treatment. Performed at [facility], [place] [address] [place], [place] [address]   Aerobic/Anaerobic Culture (surgical/deep wound)     Status: None   Collection Time: 08/31/19  2:17 PM   Specimen: PATH Cytology Misc. fluid; Body Fluid  Result Value Ref Range Status   Specimen Description PERICARDIAL  Final   Special Requests NONE  Final   Gram Stain   Final    MODERATE WBC PRESENT,BOTH PMN AND MONONUCLEAR NO ORGANISMS  SEEN    Culture   Final    No growth aerobically or anaerobically. Performed at [facility], [place] [address], [place], [place] [address]    Report Status 09/05/2019 FINAL  Final  Aerobic/Anaerobic Culture (surgical/deep wound)     Status: None   Collection Time: 08/31/19  2:29 PM   Specimen: Pericardial; Tissue  Result Value Ref Range Status   Specimen Description PERICARDIAL  Final   Special Requests NONE  Final   Gram Stain   Final    FEW WBC PRESENT, PREDOMINANTLY MONONUCLEAR NO ORGANISMS SEEN    Culture   Final    No growth aerobically or anaerobically. Performed at [facility], [place] [address] [place],  [address]    Report Status 09/05/2019 FINAL  Final     Patient was seen and examined on the day of discharge and was found to be in stable condition. Time coordinating discharge: 40 minutes including assessment and coordination of care, as well as examination of the patient.   SIGNED:  Dessa Phi, DO [facility] 09/09/2019, 2:13 PM

## 2019-09-09 NOTE — H&P (Signed)
Physical Medicine and Rehabilitation Admission H&P    Chief Complaint  Patient presents with  . Debility.    HPI: Brenda Arnold is a 63 year old female with history of T2DM, PAF, ESRD- HD MWF, OA bilateral hips s/p R-THR and need of L-THR; who was admitted on 08/25/2019 with chest pain and abnormal EKG found to be secondary to pericarditis.  CT chest negative for aneurysm or PE.  She had an echocardiogram, which showed an ejection fraction of 60-65% with no pericardial effusion.  She was started on colchicine.  She later developed atrial fibrillation with RVR with heart rate of 160-190.  She was started on amiodarone on 08/07/2019.  She had worsening of hypotension with CP on 09/29, was found to have large pericardial effusion with concerns of tamponade.  She was taken to the OR for right VATS with pericardial window by CT surgery. Post procedure hypotension treated with IV albumin and recurrent A fib with tachycardia treated with amio bolus on 9/30.  Follow up echo of 10/2 showed small pericardial effusion lateral to LV without tamponade and moderate left lateral pleural effusion and pericardial tube d/c 10/3.  She did have NSVT  <15 beats with SOB with activity 10/05  but back in NSR --no BB due to low blood pressures.  Mild pleuritic pain and bouts of tachycardia with activity decreasing. Therapy ongoing --patient noted to have balance deficits as well as DOE and tachycardia with activity. CIR recommended due to functional decline.  Please see preadmission assessment from today as well.  Review of Systems  Constitutional: Negative for chills and fever.  HENT: Negative for hearing loss and tinnitus.   Eyes: Negative for blurred vision and double vision.  Respiratory: Negative for cough and shortness of breath.        DOE  Cardiovascular: Negative for chest pain, palpitations and leg swelling.  Gastrointestinal: Negative for constipation, heartburn and nausea.  Genitourinary: Negative for  dysuria and urgency.  Musculoskeletal: Positive for joint pain (left hip pain--was planning on getting it replaced this month. ). Negative for myalgias and neck pain.  Skin: Negative for itching and rash.  Neurological: Negative for dizziness, focal weakness and headaches.  Psychiatric/Behavioral: Negative for memory loss. The patient does not have insomnia.     Past Medical History:  Diagnosis Date  . Anemia   . Arthritis   . Asthma   . Complication of anesthesia    difficulty with getting oxygen saturation up  . Diabetes mellitus   . Dyslipidemia   . Epistaxis 11/05/2012  . ESRD (end stage renal disease) on dialysis [facility])    "MWF; Jeneen Rinks" (09/15/2018)  . History of nuclear stress test    Myoview 5/18: EF 68, no infarct or ischemia, low risk  . HTN (hypertension)   . Hyperlipidemia   . Hyperparathyroidism due to renal insufficiency ([place])   . Obesity    s/p panniculectomy  . Paroxysmal A-fib ([facility])    a. chronic coumadin;  b. 12/2009 Echo: EF 60-65%, Gr 1 DD.  Marland Kitchen PCOS (polycystic ovarian syndrome)   . Pericarditis 08/2019  . Pneumonia   . Seasonal allergies   . Sleep apnea    a. not using CPAP, last study  >8 yrs  . Vitamin D deficiency     Past Surgical History:  Procedure Laterality Date  . ABDOMINAL HYSTERECTOMY     with panniculctomy  . AV FISTULA PLACEMENT  09/02/2012   Procedure: ARTERIOVENOUS (AV) FISTULA CREATION;  Surgeon:  Elam Dutch, MD;  Location: [facility] OR;  Service: Vascular;  Laterality: Left;  Creation of Left Radial-Cephalic Fistula   . BREAST SURGERY     Biopsy right breast  . COLONOSCOPY W/ BIOPSIES AND POLYPECTOMY    . DILATION AND CURETTAGE OF UTERUS    . KNEE ARTHROSCOPY Right   . REVISON OF ARTERIOVENOUS FISTULA Left 04/27/2014   Procedure: REVISON OF LEFT RADIAL-CEPHALIC ARTERIOVENOUS FISTULA;  Surgeon: Mal Misty, MD;  Location: [place];  Service: Vascular;  Laterality: Left;  . TOTAL HIP ARTHROPLASTY Right 09/15/2018  . TOTAL HIP  ARTHROPLASTY Right 09/15/2018   Procedure: RIGHT TOTAL HIP ARTHROPLASTY ANTERIOR APPROACH;  Surgeon: Leandrew Koyanagi, MD;  Location: [place];  Service: Orthopedics;  Laterality: Right;  . UVULOPLASTY    . VIDEO ASSISTED THORACOSCOPY (VATS)/WEDGE RESECTION Right 08/31/2019   Procedure: VIDEO ASSISTED THORACOSCOPY/ DRAINAGE OF PERICARDIAL EFFUSION/ PERICARDIAL WINDOW/ ABORTED PERICARDIOCENTESIS ;  Surgeon: Lajuana Matte, MD;  Location: [facility] OR;  Service: Thoracic;  Laterality: Right;    Family History  Problem Relation Age of Onset  . Lung cancer Father        died @ 72  . Hypertension Mother        alive @ 40  . Diabetes Brother   . Kidney disease Brother     Social History:  Single. Lives alone--sedentary due to bad left hip but independent with walker PTA.  Mother (83 but in great health) from [facility] to assist after discharge. Works full time from [employer]. She reports that she has never smoked. She has never used smokeless tobacco. She reports that she does not drink alcohol or use drugs.     Allergies  Allergen Reactions  . Iodine Shortness Of Breath  . Shellfish Allergy Shortness Of Breath  . Norvasc [Amlodipine] Swelling    SWELLING REACTION UNSPECIFIED     Medications Prior to Admission  Medication Sig Dispense Refill  . aspirin EC 81 MG tablet Take 1 tablet (81 mg total) by mouth 2 (two) times daily. (Patient not taking: Reported on 08/25/2019) 84 tablet 0  . atorvastatin (LIPITOR) 40 MG tablet Take 1 tablet (40 mg total) by mouth daily at 6 PM. (Patient not taking: Reported on 08/25/2019) 30 tablet 1  . bisacodyl (DULCOLAX) 10 MG suppository Place 10 mg rectally as needed for moderate constipation.    . calcitRIOL (ROCALTROL) 0.5 MCG capsule Take 3 capsules (1.5 mcg total) by mouth 3 (three) times a week. (Patient not taking: Reported on 08/25/2019) 90 capsule 0  . ELIQUIS 5 MG TABS tablet Take 5 mg by mouth 2 (two) times daily.     Marland Kitchen ethyl  chloride spray Apply 1 application topically See admin instructions. For dialysis  6  . gabapentin (NEURONTIN) 100 MG capsule Take 100 mg by mouth at bedtime.  3  . methocarbamol (ROBAXIN) 500 MG tablet Take 1 tablet (500 mg total) by mouth every 6 (six) hours as needed for muscle spasms. (Patient not taking: Reported on 08/25/2019) 30 tablet 2  . ondansetron (ZOFRAN) 4 MG tablet Take 1-2 tablets (4-8 mg total) by mouth every 8 (eight) hours as needed for nausea or vomiting. (Patient not taking: Reported on 08/25/2019) 40 tablet 0  . oxyCODONE (OXY IR/ROXICODONE) 5 MG immediate release tablet Take 1-3 tablets (5-15 mg total) by mouth every 4 (four) hours as needed. (Patient not taking: Reported on 08/25/2019) 30 tablet 0  . polycarbophil (FIBERCON) 625 MG tablet Take 625 mg by  mouth daily.    . promethazine (PHENERGAN) 25 MG tablet Take 1 tablet (25 mg total) by mouth every 6 (six) hours as needed for nausea. (Patient not taking: Reported on 08/25/2019) 30 tablet 1  . senna-docusate (SENOKOT S) 8.6-50 MG tablet Take 1 tablet by mouth at bedtime as needed. (Patient not taking: Reported on 08/25/2019) 30 tablet 1  . sucroferric oxyhydroxide (VELPHORO) 500 MG chewable tablet Chew 2 capsules by mouth 4 (four) times daily -  with meals and at bedtime.       Drug Regimen Review  Drug regimen was reviewed and remains appropriate with no significant issues identified  Home: Home Living Family/patient expects to be discharged to:: Private residence Living Arrangements: Alone Available Help at Discharge: (43 year old Mom likely to come from [facility] to stay with her) Type of Home: House Home Access: Stairs to enter [employer] of Steps: 1 step up inside from the back Entrance Stairs-Rails: None Home Layout: One level Bathroom Shower/Tub: Chiropodist: Standard Bathroom Accessibility: Yes Home Equipment: Tub bench, Environmental consultant - 2 wheels, Cane - single point, Lobbyist: Sock aid  Lives With: Alone   Functional History: Prior Function Level of Independence: Independent with assistive device(s) Comments: Pt reports completing ADLs using RW for functional mobility, tub bench in the shower, sock aide for socks; drives to dialysis  Functional Status:  Mobility: Bed Mobility Overal bed mobility: Modified Independent Bed Mobility: Supine to Sit Supine to sit: Min guard General bed mobility comments: HOB elevated, increased time and effort, steadying assist only Transfers Overall transfer level: Needs assistance Equipment used: Rolling walker (2 wheeled) Transfers: Sit to/from Stand Sit to Stand: Min assist General transfer comment: min assist for safety/balance, cueing for hand placement  Ambulation/Gait Ambulation/Gait assistance: Min guard Gait Distance (Feet): 100 Feet Assistive device: (EVA walker) Gait Pattern/deviations: Step-through pattern General Gait Details: wide BOS, mild instability, kept in check by EVA walker.  Sats difficult to get during gait, but 02% on RA within 30-40 secs of sitting.  EHR 115 bpm with gait.  Dyspnea 1-2/4 Gait velocity: slower Gait velocity interpretation: <1.8 ft/sec, indicate of risk for recurrent falls    ADL: ADL Overall ADL's : Needs assistance/impaired Eating/Feeding: Independent, Sitting Grooming: Min guard, Standing Upper Body Bathing: Set up, Sitting Lower Body Bathing: Minimal assistance, Sit to/from stand Upper Body Dressing : Set up, Sitting Lower Body Dressing: Minimal assistance, Sit to/from stand Toilet Transfer: Minimal assistance, RW, Ambulation Toilet Transfer Details (indicate cue type and reason): 3:1 over commode Toileting- Clothing Manipulation and Hygiene: Min guard, Sit to/from stand Functional mobility during ADLs: Rolling walker, Minimal assistance General ADL Comments: fatigues easily during ADL  Cognition: Cognition Overall Cognitive Status: Within  Functional Limits for tasks assessed Orientation Level: Oriented X4 Cognition Arousal/Alertness: Awake/alert Behavior During Therapy: WFL for tasks assessed/performed Overall Cognitive Status: Within Functional Limits for tasks assessed   Blood pressure 92/67, pulse 91, temperature 98.9 F (37.2 C), temperature source Oral, resp. rate 18, height 5\' 6"  (1.676 m), weight 104 kg, SpO2 93 %. Physical Exam  Nursing note and vitals reviewed. Constitutional: She is oriented to person, place, and time. She appears well-developed.  Obese  HENT:  Head: Normocephalic and atraumatic.  Eyes: Pupils are equal, round, and reactive to light. EOM are normal. Right conjunctiva is injected.  Neck: No tracheal deviation present. No thyromegaly present.  Respiratory: Effort normal. No respiratory distress.  Right prior CT suture under right breast.  GI: She exhibits no distension. There is no abdominal tenderness.  Musculoskeletal:        General: No edema.     Comments: Left wrist with graft  Neurological: She is alert and oriented to person, place, and time.  Motor: Grossly 5/5 throughout  Skin: Skin is warm and dry. No rash noted. No erythema.  Vascular changes b/l LE  Psychiatric: She has a normal mood and affect. Her behavior is normal.    Results for orders placed or performed during the hospital encounter of 08/25/19 (from the past 48 hour(s))  Glucose, capillary     Status: None   Collection Time: 09/07/19  4:46 PM  Result Value Ref Range   Glucose-Capillary 93 70 - 99 mg/dL  Glucose, capillary     Status: None   Collection Time: 09/07/19  9:18 PM  Result Value Ref Range   Glucose-Capillary 98 70 - 99 mg/dL   Comment 1 Notify RN    Comment 2 Document in Chart   Glucose, capillary     Status: None   Collection Time: 09/08/19  5:53 AM  Result Value Ref Range   Glucose-Capillary 81 70 - 99 mg/dL   Comment 1 Notify RN    Comment 2 Document in Chart   Basic metabolic panel     Status:  Abnormal   Collection Time: 09/08/19  8:21 AM  Result Value Ref Range   Sodium 136 135 - 145 mmol/L   Potassium 3.8 3.5 - 5.1 mmol/L   Chloride 100 98 - 111 mmol/L   CO2 27 22 - 32 mmol/L   Glucose, Bld 121 (H) 70 - 99 mg/dL   BUN 36 (H) 8 - 23 mg/dL   Creatinine, Ser 9.77 (H) 0.44 - 1.00 mg/dL   Calcium 8.4 (L) 8.9 - 10.3 mg/dL   GFR calc non Af Amer 4 (L) >60 mL/min   GFR calc Af Amer 4 (L) >60 mL/min   Anion gap 9 5 - 15    Comment: Performed at [facility], [address] [place], [place] [address]  CBC     Status: Abnormal   Collection Time: 09/08/19  8:21 AM  Result Value Ref Range   WBC 14.9 (H) 4.0 - 10.5 K/uL   RBC 2.90 (L) 3.87 - 5.11 MIL/uL   Hemoglobin 9.3 (L) 12.0 - 15.0 g/dL   HCT 29.5 (L) 36.0 - 46.0 %   MCV 101.7 (H) 80.0 - 100.0 fL   MCH 32.1 26.0 - 34.0 pg   MCHC 31.5 30.0 - 36.0 g/dL   RDW 16.2 (H) 11.5 - 15.5 %   Platelets 342 150 - 400 K/uL   nRBC 0.0 0.0 - 0.2 %    Comment: Performed at [facility], [place] [address] [place], [place] [address]  Glucose, capillary     Status: None   Collection Time: 09/08/19 10:46 AM  Result Value Ref Range   Glucose-Capillary 88 70 - 99 mg/dL   Comment 1 Notify RN    Comment 2 Document in Chart   Glucose, capillary     Status: Abnormal   Collection Time: 09/08/19  9:19 PM  Result Value Ref Range   Glucose-Capillary 123 (H) 70 - 99 mg/dL   Comment 1 Notify RN    Comment 2 Document in Chart   CBC     Status: Abnormal   Collection Time: 09/09/19  2:26 AM  Result Value Ref Range   WBC 15.3 (H) 4.0 -  10.5 K/uL   RBC 2.93 (L) 3.87 - 5.11 MIL/uL   Hemoglobin 9.4 (L) 12.0 - 15.0 g/dL   HCT 29.4 (L) 36.0 - 46.0 %   MCV 100.3 (H) 80.0 - 100.0 fL   MCH 32.1 26.0 - 34.0 pg   MCHC 32.0 30.0 - 36.0 g/dL   RDW 15.9 (H) 11.5 - 15.5 %   Platelets 282 150 - 400 K/uL   nRBC 0.0 0.0 - 0.2 %    Comment: Performed at [facility] [address], [place], [place] [id number]  Basic metabolic panel     Status:  Abnormal   Collection Time: 09/09/19  2:26 AM  Result Value Ref Range   Sodium 134 (L) 135 - 145 mmol/L   Potassium 4.1 3.5 - 5.1 mmol/L   Chloride 95 (L) 98 - 111 mmol/L   CO2 24 22 - 32 mmol/L   Glucose, Bld 76 70 - 99 mg/dL   BUN 17 8 - 23 mg/dL   Creatinine, Ser 5.79 (H) 0.44 - 1.00 mg/dL    Comment: DELTA CHECK NOTED   Calcium 8.4 (L) 8.9 - 10.3 mg/dL   GFR calc non Af Amer 7 (L) >60 mL/min   GFR calc Af Amer 8 (L) >60 mL/min   Anion gap 15 5 - 15    Comment: Performed at [facility], [place] [address] [place], [place] [address]  Glucose, capillary     Status: None   Collection Time: 09/09/19  6:21 AM  Result Value Ref Range   Glucose-Capillary 78 70 - 99 mg/dL   Comment 1 Notify RN    Comment 2 Document in Chart    No results found.     Medical Problem List and Plan: 1.  Debility secondary to pericarditis with pericardial tamponade.   Admit to CIR 2.  Antithrombotics: -DVT/anticoagulation:  Pharmaceutical: Other (comment)--Eliquis  -antiplatelet therapy: N/A 3. Left hip pain/Pain Management:  On gabapentin 100 mg/hs. Tylenol prn. Will add lidocaine patch for pain control.   Monitor with increased mobility 4. Mood: Very motivated to work hard and get home. LCSW to follow for evaluation and support.  -antipsychotic agents: N/A 5. Neuropsych: This patient is capable of making decisions on her own behalf. 6. Skin/Wound Care: Routine pressure relief measures.  7. Fluids/Electrolytes/Nutrition: Strict I/O. 1200 cc FR/day. Labs with HP. 8. Pericarditis: On Colchicine MWF X 3 months. Monitor for agranulocytosis.   9. T2DM: Hgb A 1c- 4.9. Blood sugars tightly controlled on SSI.   Monitor with increased mobility 10. PAF: Now in NSR on amiodarone 200 mg bid--->taper to 200 mg daily starting 10/11.    Monitor with increased physical exertion 11. Chronic hypotension: Baseline SBP 80-90's range on midodrine tid. Reports can drop to 50's with HD.   Monitor for signs and  symptoms Ace wraps/teds/abdominal binder if necessary. 12.  ESRD: HD MWF at end of the day to help with tolerance of therapy.  13. Anemia of chronic disease: Being monitored. On weekly Aranesp.  Labs with HD. 14. Leucocytosis: Persistent--monitor for fevers and other signs of infection.  Labs with HD. 15.  Morbidly skin: Encouraged weight loss  Bary Leriche, PA-C 09/09/2019  I have personally performed a face to face diagnostic evaluation, including, but not limited to relevant history and physical exam findings, of this patient and developed relevant assessment and plan.  Additionally, I have reviewed and concur with the physician assistant's documentation above.  Delice Lesch, MD, ABPMR

## 2019-09-09 NOTE — Progress Notes (Signed)
Pt discharged to Rehab unit.Taken in wheelchair.

## 2019-09-09 NOTE — Progress Notes (Signed)
Patient arrived to unit, no s/s of distress.  Audie Clear, LPN

## 2019-09-10 ENCOUNTER — Inpatient Hospital Stay (HOSPITAL_COMMUNITY): Payer: BC Managed Care – PPO | Admitting: Occupational Therapy

## 2019-09-10 ENCOUNTER — Inpatient Hospital Stay (HOSPITAL_COMMUNITY): Payer: BC Managed Care – PPO | Admitting: Physical Therapy

## 2019-09-10 ENCOUNTER — Ambulatory Visit: Payer: Self-pay | Admitting: Thoracic Surgery (Cardiothoracic Vascular Surgery)

## 2019-09-10 ENCOUNTER — Inpatient Hospital Stay (HOSPITAL_COMMUNITY): Payer: BC Managed Care – PPO

## 2019-09-10 DIAGNOSIS — R5381 Other malaise: Principal | ICD-10-CM

## 2019-09-10 LAB — GLUCOSE, CAPILLARY
Glucose-Capillary: 99 mg/dL (ref 70–99)
Glucose-Capillary: 75 mg/dL (ref 70–99)
Glucose-Capillary: 73 mg/dL (ref 70–99)
Glucose-Capillary: 57 mg/dL — ABNORMAL LOW (ref 70–99)
Glucose-Capillary: 59 mg/dL — ABNORMAL LOW (ref 70–99)
Glucose-Capillary: 63 mg/dL — ABNORMAL LOW (ref 70–99)
Glucose-Capillary: 73 mg/dL (ref 70–99)
Glucose-Capillary: 111 mg/dL — ABNORMAL HIGH (ref 70–99)

## 2019-09-10 MED ORDER — DARBEPOETIN ALFA 60 MCG/0.3ML IJ SOSY
PREFILLED_SYRINGE | INTRAMUSCULAR | Status: AC
  Administered 2019-09-10: 60 ug via SUBCUTANEOUS
  Filled 2019-09-10: qty 0.3

## 2019-09-10 NOTE — Care Management Note (Signed)
[facility] Individual Statement of Services  Patient Name:  Brenda Arnold  Date:  09/10/2019  Welcome to the [facility].  Our goal is to provide you with an individualized program based on your diagnosis and situation, designed to meet your specific needs.  With this comprehensive rehabilitation program, you will be expected to participate in at least 3 hours of rehabilitation therapies Monday-Friday, with modified therapy programming on the weekends.  Your rehabilitation program will include the following services:  Physical Therapy (PT), Occupational Therapy (OT), 24 hour per day rehabilitation nursing, Case Management (Social Worker), Rehabilitation Medicine, Nutrition Services and Pharmacy Services  Weekly team conferences will be held on Wednesday to discuss your progress.  Your Social Worker will talk with you frequently to get your input and to update you on team discussions.  Team conferences with you and your family in attendance may also be held.  Expected length of stay: 7  days  Overall anticipated outcome: independent with device  Depending on your progress and recovery, your program may change. Your Social Worker will coordinate services and will keep you informed of any changes. Your Social Worker's name and contact numbers are listed  below.  The following services may also be recommended but are not provided by the [facility] will be made to provide these services after discharge if needed.  Arrangements include referral to agencies that provide these services.  Your insurance has been verified to be:  [employer] Your primary doctor is:  Kelton Pillar  Pertinent information will be shared with your doctor and your insurance company.  Social Worker:  Ovidio Kin, [place] or [phone]  Information discussed with and copy given to patient by: Elease Hashimoto, 09/10/2019, 9:08 AM

## 2019-09-10 NOTE — Progress Notes (Signed)
[facility] PHYSICAL MEDICINE & REHABILITATION PROGRESS NOTE   Subjective/Complaints:  Pt reports feeling good this AM- just got a shower- had 2 BMs yesterday and denies constipation.  Denies dysuria- oliguric and breathing well.    ROS: denies CP, SOB, N/V/D   Objective:   No results found. Recent Labs    09/08/19 0821 09/09/19 0226  WBC 14.9* 15.3*  HGB 9.3* 9.4*  HCT 29.5* 29.4*  PLT 342 282   Recent Labs    09/08/19 0821 09/09/19 0226  NA 136 134*  K 3.8 4.1  CL 100 95*  CO2 27 24  GLUCOSE 121* 76  BUN 36* 17  CREATININE 9.77* 5.79*  CALCIUM 8.4* 8.4*    Intake/Output Summary (Last 24 hours) at 09/10/2019 1321 Last data filed at 09/10/2019 0836 Gross per 24 hour  Intake 238 ml  Output -  Net 238 ml     Physical Exam: Vital Signs Blood pressure 120/62, pulse 78, temperature 98.7 F (37.1 C), temperature source Oral, resp. rate 19, SpO2 98 %.  Physical Exam  Nursing note, labs, and vitals reviewed. Constitutional: pt awake, alert, appropriate, sitting on chair in room, nude; just got out of shower,  OT at bedside, NAD Obese woman; appears younger than stated age  HENT:  Head: Normocephalic and atraumatic.  Eyes: conjugate gaze Right conjunctiva is injected.  Neck: No tracheal deviation present. No thyromegaly present.  Respiratory: CTA B/L- no W/R/R Right prior CT suture under right breast.    GI: abd protuberant, soft, NT, ND, (+)hypoactive BS  Musculoskeletal:        General: No edema.     Comments: Left wrist with graft - palpable  Neurological: She is alert and oriented to person, place, and time.  Motor: Grossly 5/5 throughout  Skin: Skin is warm and dry. No rash noted. No erythema.  Vascular changes b/l LE  Psychiatric: full, affect      Assessment/Plan: 1. Functional deficits secondary to debility due to pericarditis/pericardial tamponade which require 3+ hours per day of interdisciplinary therapy in a comprehensive inpatient rehab  setting.  Physiatrist is providing close team supervision and 24 hour management of active medical problems listed below.  Physiatrist and rehab team continue to assess barriers to discharge/monitor patient progress toward functional and medical goals  Care Tool:  Bathing    Body parts bathed by patient: Right arm, Left arm, Chest, Abdomen, Front perineal area, Buttocks, Right upper leg, Left upper leg, Right lower leg, Left lower leg, Face         Bathing assist Assist Level: Contact Guard/Touching assist     Upper Body Dressing/Undressing Upper body dressing   What is the patient wearing?: Pull over shirt    Upper body assist Assist Level: Set up assist    Lower Body Dressing/Undressing Lower body dressing      What is the patient wearing?: Pants     Lower body assist Assist for lower body dressing: Contact Guard/Touching assist     Toileting Toileting    Toileting assist Assist for toileting: Minimal Assistance - Patient > 75%     Transfers Chair/bed transfer  Transfers assist     Chair/bed transfer assist level: Contact Guard/Touching assist     Locomotion Ambulation   Ambulation assist      Assist level: Independent with assistive device Assistive device: Other (comment)(walker and +1 person)     Walk 10 feet activity   Assist  Walk 50 feet activity   Assist           Walk 150 feet activity   Assist           Walk 10 feet on uneven surface  activity   Assist           Wheelchair     Assist               Wheelchair 50 feet with 2 turns activity    Assist            Wheelchair 150 feet activity     Assist          Blood pressure 120/62, pulse 78, temperature 98.7 F (37.1 C), temperature source Oral, resp. rate 19, SpO2 98 %.  Medical Problem List and Plan: 1.  Debility secondary to pericarditis with pericardial tamponade.              Admit to CIR 2.   Antithrombotics: -DVT/anticoagulation:  Pharmaceutical: Other (comment)--Eliquis             -antiplatelet therapy: N/A 3. Left hip pain/Pain Management:  On gabapentin 100 mg/hs. Tylenol prn. Will add lidocaine patch for pain control.              Monitor with increased mobility 4. Mood: Very motivated to work hard and get home. LCSW to follow for evaluation and support.             -antipsychotic agents: N/A 5. Neuropsych: This patient is capable of making decisions on her own behalf. 6. Skin/Wound Care: Routine pressure relief measures.  7. Fluids/Electrolytes/Nutrition: Strict I/O. 1200 cc FR/day. Labs with HP. 8. Pericarditis: On Colchicine MWF X 3 months. Monitor for agranulocytosis.   9. T2DM: Hgb A 1c- 4.9. Blood sugars tightly controlled on SSI.    CBG (last 3)  Recent Labs    09/09/19 2138 09/10/19 0629 09/10/19 1211  GLUCAP 92 75 73    10/9- great control             Monitor with increased mobility 10. PAF: Now in NSR on amiodarone 200 mg bid--->taper to 200 mg daily starting 10/11.               Monitor with increased physical exertion 11. Chronic hypotension: Baseline SBP 80-90's range on midodrine tid. Reports can drop to 50's with HD.              Monitor for signs and symptoms Ace wraps/teds/abdominal binder if necessary. 12.  ESRD: HD MWF at end of the day to help with tolerance of therapy.  13. Anemia of chronic disease: Being monitored. On weekly Aranesp.  Labs with HD. 14. Leucocytosis: Persistent--monitor for fevers and other signs of infection.  Labs with HD.  10/9- labs show WBC of 15.3 (was 14.9)- will check CXR and U/A and Cx and ordered labs for tomorrow. Of note, is afebrile. 15.  Morbidly obese: Encouraged weight loss     LOS: 1 days A FACE TO FACE EVALUATION WAS PERFORMED  Brenda Arnold 09/10/2019, 1:21 PM

## 2019-09-10 NOTE — Evaluation (Signed)
Physical Therapy Assessment and Plan  Patient Details  Name: Brenda Arnold MRN: [id number] Date of Birth: 01/03/1956  PT Diagnosis: Abnormal posture, Abnormality of gait, Difficulty walking and Muscle weakness Rehab Potential: Excellent ELOS: 5-7 days   Today's Date: 09/10/2019 PT Individual Time: 1105-1206 PT Individual Time Calculation (min): 61 min    Problem List:  Patient Active Problem List   Diagnosis Date Noted  . Physical debility 09/09/2019  . Pericardial effusion   . SOB (shortness of breath)   . Leukocytosis   . Anemia of chronic disease   . PAF (paroxysmal atrial fibrillation) ([place])   . Postoperative pain   . Cardiac tamponade   . Chest pain at rest 08/26/2019  . Tachycardia 08/26/2019  . Pericarditis 08/26/2019  . Angina pectoris, unspecified ([place]) 08/25/2019  . CAD (coronary artery disease) 11/05/2018  . A-fib ([place]) 11/05/2018  . Atrial fibrillation with RVR ([place])   . Hypotension 11/04/2018  . ESRD on hemodialysis ([place]) 11/04/2018  . DVT (deep venous thrombosis) ([place]) 11/03/2018  . Primary osteoarthritis of right hip 09/15/2018  . Status post total replacement of right hip 09/15/2018  . Varicose veins of bilateral lower extremities with other complications 43/88/8757  . Anticoagulation goal of INR 2 to 3 09/30/2014  . Pain in lower limb 05/02/2014  . Onychomycosis due to dermatophyte 04/06/2013  . Pain in joint, ankle and foot 04/06/2013  . Bradycardia 11/06/2012  . Left-sided epistaxis 11/05/2012  . DM (diabetes mellitus) ([place]) 11/05/2012  . OSA (obstructive sleep apnea) 11/05/2012  . Acute posterior epistaxis 11/05/2012  . CKD (chronic kidney disease) 11/05/2012  . End stage renal disease ([place]) 06/25/2012  . Type 2 diabetes mellitus with complication, without long-term current use of insulin ([place]) 12/20/2009  . OBESITY 12/20/2009  . OBESITY-MORBID (>100') 12/20/2009  . Essential hypertension 12/20/2009  . Paroxysmal atrial fibrillation ([place])  12/20/2009  . Chest pain 12/20/2009  . ABNORMAL CV (STRESS) TEST 12/20/2009    Past Medical History:  Past Medical History:  Diagnosis Date  . Anemia   . Arthritis   . Asthma   . Complication of anesthesia    difficulty with getting oxygen saturation up  . Diabetes mellitus   . Dyslipidemia   . Epistaxis 11/05/2012  . ESRD (end stage renal disease) on dialysis [facility])    "MWF; Jeneen Rinks" (09/15/2018)  . History of nuclear stress test    Myoview 5/18: EF 68, no infarct or ischemia, low risk  . HTN (hypertension)   . Hyperlipidemia   . Hyperparathyroidism due to renal insufficiency ([place])   . Obesity    s/p panniculectomy  . Paroxysmal A-fib (HCC)    a. chronic coumadin;  b. 12/2009 Echo: EF 60-65%, Gr 1 DD.  Marland Kitchen PCOS (polycystic ovarian syndrome)   . Pericarditis 08/2019  . Pneumonia   . Seasonal allergies   . Sleep apnea    a. not using CPAP, last study  >8 yrs  . Vitamin D deficiency    Past Surgical History:  Past Surgical History:  Procedure Laterality Date  . ABDOMINAL HYSTERECTOMY     with panniculctomy  . AV FISTULA PLACEMENT  09/02/2012   Procedure: ARTERIOVENOUS (AV) FISTULA CREATION;  Surgeon: Elam Dutch, MD;  Location: [facility] OR;  Service: Vascular;  Laterality: Left;  Creation of Left Radial-Cephalic Fistula   . BREAST SURGERY     Biopsy right breast  . COLONOSCOPY W/ BIOPSIES AND POLYPECTOMY    . DILATION AND CURETTAGE OF UTERUS    .  KNEE ARTHROSCOPY Right   . REVISON OF ARTERIOVENOUS FISTULA Left 04/27/2014   Procedure: REVISON OF LEFT RADIAL-CEPHALIC ARTERIOVENOUS FISTULA;  Surgeon: Mal Misty, MD;  Location: [place];  Service: Vascular;  Laterality: Left;  . TOTAL HIP ARTHROPLASTY Right 09/15/2018  . TOTAL HIP ARTHROPLASTY Right 09/15/2018   Procedure: RIGHT TOTAL HIP ARTHROPLASTY ANTERIOR APPROACH;  Surgeon: Leandrew Koyanagi, MD;  Location: [place];  Service: Orthopedics;  Laterality: Right;  . UVULOPLASTY    . VIDEO ASSISTED THORACOSCOPY (VATS)/WEDGE  RESECTION Right 08/31/2019   Procedure: VIDEO ASSISTED THORACOSCOPY/ DRAINAGE OF PERICARDIAL EFFUSION/ PERICARDIAL WINDOW/ ABORTED PERICARDIOCENTESIS ;  Surgeon: Lajuana Matte, MD;  Location: [facility] OR;  Service: Thoracic;  Laterality: Right;    Assessment & Plan Clinical Impression: Patient is a 63 y.o. year old female with history of T2DM, PAF, ESRD- HD MWF, OA bilateral hips s/p R-THR and need of L-THR; who was admitted on 08/25/2019 with chest pain and abnormal EKG found to be secondary to pericarditis.  History taken from chart review and patient.  CT chest negative for aneurysm or PE.  She had an echocardiogram, which showed an ejection fraction of 60-65% with no pericardial effusion.  She was started on colchicine.  She later developed atrial fibrillation with RVR with heart rate of 160-190.  She was started on amiodarone on 08/07/2019.  She had worsening of hypotension with CP on 09/29, was found to have large pericardial effusion with concerns of tamponade.  She was taken to the OR for right VATS with pericardial window by CT surgery. Post procedure hypotension treated with IV albumin and recurrent A fib with tachycardia treated with amio bolus on 9/30.  Follow up echo of 10/2 showed small pericardial effusion lateral to LV without tamponade and moderate left lateral pleural effusion and pericardial tube d/c 10/3.  She did have NSVT  <15 beats with SOB with activity 10/05  but back in NSR --no BB due to low blood pressures.  Mild pleuritic pain and bouts of tachycardia with activity decreasing. Therapy ongoing --patient noted to have balance deficits as well as DOE and tachycardia with activity. CIR recommended due to functional decline.  Please see preadmission assessment from today as well. Patient transferred to CIR on 09/09/2019 .   Patient currently requires CGA with mobility secondary to muscle weakness, decreased cardiorespiratoy endurance and decreased standing balance and decreased balance  strategies.  Prior to hospitalization, patient was modified independent  with mobility and lived with Alone in a House home.  Home access is 1 step up inside from the back (main entrance), and 4-5 steps in front .  Patient will benefit from skilled PT intervention to maximize safe functional mobility, minimize fall risk and decrease caregiver burden for planned discharge home alone.  Anticipate patient will benefit from follow up [place] at discharge.  PT - End of Session Activity Tolerance: Tolerates 30+ min activity with multiple rests Endurance Deficit: Yes Endurance Deficit Description: requires frequent rest breaks during mobility and standing PT Assessment Rehab Potential (ACUTE/IP ONLY): Excellent PT Barriers to Discharge: Decreased caregiver support;Lack of/limited family support;Home environment access/layout;Inaccessible home environment PT Patient demonstrates impairments in the following area(s): Balance;Endurance;Motor;Pain;Safety PT Transfers Functional Problem(s): Bed Mobility;Bed to Chair;Car;Furniture PT Locomotion Functional Problem(s): Ambulation;Stairs PT Plan PT Intensity: Minimum of 1-2 x/day ,45 to 90 minutes PT Frequency: 5 out of 7 days PT Duration Estimated Length of Stay: 5-7 days PT Treatment/Interventions: Ambulation/gait training;Community reintegration;DME/adaptive equipment instruction;Neuromuscular re-education;Psychosocial support;Stair training;UE/LE Strength taining/ROM;Balance/vestibular training;Discharge planning;Functional electrical stimulation;Pain  management;Skin care/wound management;Therapeutic Activities;UE/LE Coordination activities;Cognitive remediation/compensation;Disease management/prevention;Functional mobility training;Splinting/orthotics;Patient/family education;Therapeutic Exercise;Visual/perceptual remediation/compensation PT Transfers Anticipated Outcome(s): mod-I with LRAD PT Locomotion Anticipated Outcome(s): mod-I with LRAD PT  Recommendation Follow Up Recommendations: Home health PT Patient destination: Home Equipment Recommended: To be determined;Other (comment) Equipment Details: has RW  Skilled Therapeutic Intervention Evaluation completed (see details above and below) with education on PT POC and goals and individual treatment initiated with focus on bed mobility, transfers, gait, stair navigation, and activity tolerance/endurance as well as education regarding daily therapy schedule, weekly team meetings, purpose of PT evaluation, and other CIR information. Pt received sitting in w/c and agreeable to therapy session.  Transported to/from gym in w/c for energy conservation. Performed sit<>stands with CGA for steadying throughout session. Ambulated 125f using RW, 1 standing rest break, with CGA for steadying. HR 86bpm, SpO2 100%.  Simulated ambulatory car transfer (SUV height) using RW with CGA for steadying. Ambulated ~186fup/down ramp using RW with CGA for steadying. HR 95bpm and SpO2 94%. Supine<>sit on mat table with supervision. Stand pivot EOM>w/c using RW with CGA for steadying. Pt reports she uses RW up/down front 4 steps when it raises as the main entrance gets water puddles. Ascended/descended 4 steps using RW with CGA for steadying and max cuing for proper AD placement on stairs for increased safety.  HR 95bpm, SpO2 93%. Participated in 6 minute walk test with vitals monitored throughout: HR varying 104-111bpm and SpO2 95-96%. Pt able to walk a total of 26548fith 2x seated rest breaks at 1mi72m0seconds and at 5 minutes. Vitals recover to HR in 80s and SpO2 100% with seated rest break. Transported back to room in w/c and pt left seated in w/c with needs in reach and chair alarm on.  Pt reports her main concern is "being able to walk a reasonable distance without getting winded"   PT Evaluation Precautions/Restrictions Precautions Precautions: Fall Restrictions Weight Bearing Restrictions: No Vital Signs    Monitored HR and SpO2 during activity - details above. Pain Pain Assessment Pain Scale: 0-10 Pain Score: 0-No pain Home Living/Prior Functioning Home Living Living Arrangements: Alone Available Help at Discharge: Other (Comment)(83y.o. mother MIGHT be able [facility] [place] [facility] Type of Home: House Home Access: Stairs to enter Entr[employer]Point EnergySteps: 1 step up inside from the back (main entrance), and 4-5 steps in front Entrance Stairs-Rails: None Home Layout: One level Bathroom Shower/Tub: Tub/Chiropodistandard Bathroom Accessibility: Yes  Lives With: Alone Prior Function Level of Independence: Independent with basic ADLs;Independent with homemaking with ambulation;Requires assistive device for independence(required RW for ambulation)  Able to Take Stairs?: Yes(states "if I had to" she could) Driving: Yes Vocation: Full time employment Vocation Requirements: works from homeMicrobiologistments: Pt reports completing ADLs using RW for functional mobility, tub bench in the shower, sock aide for socks; drives to dialysis; reports when going to a store would walk from car [facility] use an electronic cart for mobility inside the store Vision/Perception  Perception Perception: Within Functional Limits Praxis Praxis: Intact  Cognition Overall Cognitive Status: Within Functional Limits for tasks assessed Arousal/Alertness: Awake/alert Orientation Level: Oriented X4 Attention: Focused;Sustained Focused Attention: Appears intact Sustained Attention: Appears intact Selective Attention: Appears intact Memory: Appears intact Immediate Memory Recall: Sock;Blue;Bed Memory Recall Sock: Without Cue Memory Recall Blue: Without Cue Memory Recall Bed: Without Cue Awareness: Appears intact Problem Solving: Appears intact Safety/Judgment: Appears intact Sensation Sensation Light Touch: Appears  Intact Hot/Cold: Not  tested Proprioception: Appears Intact Stereognosis: Not tested Coordination Gross Motor Movements are Fluid and Coordinated: No Fine Motor Movements are Fluid and Coordinated: Yes Coordination and Movement Description: impaired due to generalized weakness Heel Shin Test: WNL and symmetrical Motor  Motor Motor: Other (comment) Motor - Skilled Clinical Observations: impaired endurance with overall deconditioning  Mobility Bed Mobility Bed Mobility: Supine to Sit;Sit to Supine Supine to Sit: Supervision/Verbal cueing Sit to Supine: Supervision/Verbal cueing Transfers Transfers: Sit to Stand;Stand to Sit;Stand Pivot Transfers Sit to Stand: Contact Guard/Touching assist Stand to Sit: Contact Guard/Touching assist Stand Pivot Transfers: Contact Guard/Touching assist Transfer (Assistive device): Rolling walker Locomotion  Gait Ambulation: Yes Gait Assistance: Contact Guard/Touching assist Gait Distance (Feet): 140 Feet Assistive device: Rolling walker Gait Gait: Yes Gait Pattern: Impaired Gait Pattern: Decreased step length - left;Decreased step length - right;Wide base of support;Poor foot clearance - left;Poor foot clearance - right Gait velocity: decreased Stairs / Additional Locomotion Stairs: Yes Stairs Assistance: Contact Guard/Touching assist Stair Management Technique: With walker Number of Stairs: 4 Height of Stairs: 6 Ramp: Contact Guard/touching assist Curb: Contact Guard/Touching assist Wheelchair Mobility Wheelchair Mobility: No  Trunk/Postural Assessment  Cervical Assessment Cervical Assessment: Within Functional Limits Thoracic Assessment Thoracic Assessment: Within Functional Limits Lumbar Assessment Lumbar Assessment: Exceptions to WFL(posterior pelvic tilt in sitting) Postural Control Postural Control: Deficits on evaluation Postural Limitations: decreased as pt uses UE support on RW for standing balance  Balance Balance Balance Assessed: Yes Static  Sitting Balance Static Sitting - Level of Assistance: 7: Independent Dynamic Sitting Balance Dynamic Sitting - Level of Assistance: 5: Stand by assistance Static Standing Balance Static Standing - Balance Support: Bilateral upper extremity supported;During functional activity Static Standing - Level of Assistance: 4: Min assist Dynamic Standing Balance Dynamic Standing - Balance Support: Bilateral upper extremity supported;During functional activity Dynamic Standing - Level of Assistance: 4: Min assist Extremity Assessment      RLE Assessment RLE Assessment: Exceptions to [facility] RLE Strength Right Hip Flexion: 4/5 Right Knee Flexion: 4/5 Right Knee Extension: 4/5 Right Ankle Dorsiflexion: 4/5 Right Ankle Plantar Flexion: 4/5 LLE Assessment LLE Assessment: Exceptions to [facility] LLE Strength Left Hip Flexion: 4/5 Left Knee Flexion: 4/5 Left Knee Extension: 4/5 Left Ankle Dorsiflexion: 4/5 Left Ankle Plantar Flexion: 4/5    Refer to Care Plan for Long Term Goals  Recommendations for other services: None   Discharge Criteria: Patient will be discharged from PT if patient refuses treatment 3 consecutive times without medical reason, if treatment goals not met, if there is a change in medical status, if patient makes no progress towards goals or if patient is discharged from hospital.  The above assessment, treatment plan, treatment alternatives and goals were discussed and mutually agreed upon: by patient  Tawana Scale, PT, DPT 09/10/2019, 7:52 AM

## 2019-09-10 NOTE — Significant Event (Signed)
Hypoglycemic Event  CBG: 63  Treatment: 8 oz juice/soda  Symptoms: None  Follow-up CBG: Time:715 CBG Result:73  Possible Reasons for Event: Other: Dialysis   Comments/MD notified:no    Brenda Arnold

## 2019-09-10 NOTE — Progress Notes (Signed)
Social Work Assessment and Plan   Patient Details  Name: Brenda Arnold MRN: [id number] Date of Birth: 29-Nov-1956  Today's Date: 09/10/2019  Problem List:  Patient Active Problem List   Diagnosis Date Noted  . Physical debility 09/09/2019  . Pericardial effusion   . SOB (shortness of breath)   . Leukocytosis   . Anemia of chronic disease   . PAF (paroxysmal atrial fibrillation) ([place])   . Postoperative pain   . Cardiac tamponade   . Chest pain at rest 08/26/2019  . Tachycardia 08/26/2019  . Pericarditis 08/26/2019  . Angina pectoris, unspecified ([place]) 08/25/2019  . CAD (coronary artery disease) 11/05/2018  . A-fib ([place]) 11/05/2018  . Atrial fibrillation with RVR ([place])   . Hypotension 11/04/2018  . ESRD on hemodialysis ([place]) 11/04/2018  . DVT (deep venous thrombosis) ([place]) 11/03/2018  . Primary osteoarthritis of right hip 09/15/2018  . Status post total replacement of right hip 09/15/2018  . Varicose veins of bilateral lower extremities with other complications 0000000  . Anticoagulation goal of INR 2 to 3 09/30/2014  . Pain in lower limb 05/02/2014  . Onychomycosis due to dermatophyte 04/06/2013  . Pain in joint, ankle and foot 04/06/2013  . Bradycardia 11/06/2012  . Left-sided epistaxis 11/05/2012  . DM (diabetes mellitus) ([place]) 11/05/2012  . OSA (obstructive sleep apnea) 11/05/2012  . Acute posterior epistaxis 11/05/2012  . CKD (chronic kidney disease) 11/05/2012  . End stage renal disease ([place]) 06/25/2012  . Type 2 diabetes mellitus with complication, without long-term current use of insulin ([place]) 12/20/2009  . OBESITY 12/20/2009  . OBESITY-MORBID (>100') 12/20/2009  . Essential hypertension 12/20/2009  . Paroxysmal atrial fibrillation ([place]) 12/20/2009  . Chest pain 12/20/2009  . ABNORMAL CV (STRESS) TEST 12/20/2009   Past Medical History:  Past Medical History:  Diagnosis Date  . Anemia   . Arthritis   . Asthma   . Complication of anesthesia    difficulty with getting oxygen saturation up  . Diabetes mellitus   . Dyslipidemia   . Epistaxis 11/05/2012  . ESRD (end stage renal disease) on dialysis [facility])    "MWF; Jeneen Rinks" (09/15/2018)  . History of nuclear stress test    Myoview 5/18: EF 68, no infarct or ischemia, low risk  . HTN (hypertension)   . Hyperlipidemia   . Hyperparathyroidism due to renal insufficiency ([place])   . Obesity    s/p panniculectomy  . Paroxysmal A-fib ([facility])    a. chronic coumadin;  b. 12/2009 Echo: EF 60-65%, Gr 1 DD.  Marland Kitchen PCOS (polycystic ovarian syndrome)   . Pericarditis 08/2019  . Pneumonia   . Seasonal allergies   . Sleep apnea    a. not using CPAP, last study  >8 yrs  . Vitamin D deficiency    Past Surgical History:  Past Surgical History:  Procedure Laterality Date  . ABDOMINAL HYSTERECTOMY     with panniculctomy  . AV FISTULA PLACEMENT  09/02/2012   Procedure: ARTERIOVENOUS (AV) FISTULA CREATION;  Surgeon: Elam Dutch, MD;  Location: [facility] OR;  Service: Vascular;  Laterality: Left;  Creation of Left Radial-Cephalic Fistula   . BREAST SURGERY     Biopsy right breast  . COLONOSCOPY W/ BIOPSIES AND POLYPECTOMY    . DILATION AND CURETTAGE OF UTERUS    . KNEE ARTHROSCOPY Right   . REVISON OF ARTERIOVENOUS FISTULA Left 04/27/2014   Procedure: REVISON OF LEFT RADIAL-CEPHALIC ARTERIOVENOUS FISTULA;  Surgeon: Mal Misty, MD;  Location: [place];  Service:  Vascular;  Laterality: Left;  . TOTAL HIP ARTHROPLASTY Right 09/15/2018  . TOTAL HIP ARTHROPLASTY Right 09/15/2018   Procedure: RIGHT TOTAL HIP ARTHROPLASTY ANTERIOR APPROACH;  Surgeon: Leandrew Koyanagi, MD;  Location: [place];  Service: Orthopedics;  Laterality: Right;  . UVULOPLASTY    . VIDEO ASSISTED THORACOSCOPY (VATS)/WEDGE RESECTION Right 08/31/2019   Procedure: VIDEO ASSISTED THORACOSCOPY/ DRAINAGE OF PERICARDIAL EFFUSION/ PERICARDIAL WINDOW/ ABORTED PERICARDIOCENTESIS ;  Surgeon: Lajuana Matte, MD;  Location: [place];  Service:  Thoracic;  Laterality: Right;   Social History:  reports that she has never smoked. She has never used smokeless tobacco. She reports that she does not drink alcohol or use drugs.  Family / Support Systems Marital Status: Single Patient Roles: Parent, Other (Comment)(freinds) Other Supports: Leeann Must in [place] [id number] Sister-45 yo and nice 11 yo in [place] with Mom Anticipated Caregiver: Mom may come if pt needs her too at DC Ability/Limitations of Caregiver: Mom in good health but nees to be careful is 63 yo Caregiver Availability: Other (Comment)(Depends upon need at DC) Family Dynamics: Close with Mom, sister and freinds. She has no family locally but has managed and has always been independent and taken care of herself. She feels she has good supports.  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 She feels due to her hospitalization she is extremely weak and needs to rebuild her strength while here. Once she gets up she feels pretty good but tires easily. Recent Psychosocial Issues: other health issues she managed and remained independent level Psychiatric History: No history deferred depression screeing due to coping appropriately and is doing well. Has a good attitude and is ready  to work in therapies. Substance Abuse History: No issues  Patient / Family Perceptions, Expectations & Goals Pt/Family understanding of illness & functional limitations: Pt can explain her health issues and reaosn for being here and talks with the MD daily and feels she has a good understanding of her condition and treatment plan going forward. She feels much better than she did. Premorbid pt/family roles/activities: Sister, daughter, dialysis pt, friend, employee, etc Anticipated changes in roles/activities/participation: resume Pt/family expectations/goals: Pt states: " I plan on being able to take care of myself when I leave here, my Mom should stay in [place]."  [employer]: Other (Comment)([address]-M,W,F HD) Premorbid Home Care/DME Agencies: Other (Comment)(has rw, tub bench and bsc) Transportation available at discharge: Friends can transport if she can't drive when she leaves here  Discharge Planning Living Arrangements: Alone Support Systems: Parent, Other relatives, Friends/neighbors, Social worker community Type of Residence: Private residence Insurance Resources: Multimedia programmer (specify)(BCBS) Financial Resources: Employment Financial Screen Referred: No Living Expenses: Education officer, community Management: Patient Does the patient have any problems obtaining your medications?: No Home Management: Self Patient/Family Preliminary Plans: Return home with her friends checking in on her or if needs assist willl have Mom come up from [place] to assist for a short time. Will await  team's evaluations and work on discharge plans. If possible hopefully can reach mod/i level. Social Work Anticipated Follow Up Needs: HH/OP  Clinical Impression Very pleasant motivated patient who has always been able to remain independent even with HD and still works full time. Her mom and sister are supportive but live in [place] if needed her Mom will come up to assist. Pt hopes she can be mod/i  before returning home. She is coping well with all of this and positive regarding her recovery.  Elease Hashimoto 09/10/2019, 9:20 AM

## 2019-09-10 NOTE — Progress Notes (Signed)
Occupational Therapy Session Note  Patient Details  Name: Brenda Arnold MRN: [id number] Date of Birth: 1955-12-05  Today's Date: 09/10/2019 OT Individual Time: 0915-1030 OT Individual Time Calculation (min): 75 min    Short Term Goals: Week 1:  OT Short Term Goal 1 (Week 1): STG = LTGs due to ELOS  Skilled Therapeutic Interventions/Progress Updates:    Pt received in w/c ready for therapy.    ADL Retraining: -tub bench transfers with S with RW in ADL apt -S with RW to toilet ambulating into bathroom  -S with making up full sized bed using bed for support as she walked around the bed -discussed at length the procedures she uses at home for laundry, cooking, cleaning her bathroom - reviewed EC techniques  Therapeutic Activity/ Exercise: ambulation in hallway with RW with close S. Pt could tolerate going about 50 feet before needing a rest break.  Transfers: close S with RW  Balance: close S with RW for support as she reached toward floor level  Pt did very well monitoring her fatigue levels to stop and take breaks before she became too fatigued.    Pt resting in w/c with chair pad alarm on and all needs met.  Therapy Documentation Precautions:  Precautions Precautions: Fall Restrictions Weight Bearing Restrictions: No   Pain: Pain Assessment Pain Scale: 0-10 Pain Score: 0-No pain ADL: ADL Grooming: Contact guard Where Assessed-Grooming: Standing at sink Upper Body Bathing: Supervision/safety Where Assessed-Upper Body Bathing: Shower Lower Body Bathing: Contact guard Where Assessed-Lower Body Bathing: Shower Upper Body Dressing: Setup Where Assessed-Upper Body Dressing: Sitting at sink Lower Body Dressing: Minimal assistance Where Assessed-Lower Body Dressing: Sitting at sink, Standing at sink Toileting: Minimal assistance Where Assessed-Toileting: Glass blower/designer: Psychiatric nurse Method: Psychologist, educational:  Curator Method: Radiographer, therapeutic: Radio broadcast assistant, Grab bars   Therapy/Group: Individual Therapy  [place] 09/10/2019, 8:29 AM

## 2019-09-10 NOTE — Progress Notes (Signed)
[facility] Progress Note   Subjective: Up in chair eating lunch. Says she feels great. For HD today.   Objective Vitals:   09/09/19 1610 09/09/19 1956 09/09/19 1956 09/10/19 0338  BP: 121/63 125/63 125/63 120/62  Pulse: 83 80 80 78  Resp: 16 18 18 19   Temp: 98.7 F (37.1 C) 98.9 F (37.2 C) 98.9 F (37.2 C) 98.7 F (37.1 C)  TempSrc: Oral Oral Oral Oral  SpO2: 100%  97% 98%   Physical Exam General: Pleasant older female in NAD Heart: S1,S2 RRR No M/G/R Lungs:CTAB Abdomen: obese, NT Extremities: No LE edema Dialysis Access: L AVF + bruit aneurysmal with hypo-pigmented area on AVF    Additional Objective Labs: Basic Metabolic Panel: Recent Labs  Lab 09/05/19 0906 09/06/19 0322 09/07/19 0404 09/08/19 0821 09/09/19 0226  NA 137 138 138 136 134*  K 4.0 5.0 3.8 3.8 4.1  CL 100 102 101 100 95*  CO2 25 25 26 27 24   GLUCOSE 98 90 85 121* 76  BUN 31* 42* 22 36* 17  CREATININE 9.05* 10.41* 7.19* 9.77* 5.79*  CALCIUM 8.3* 7.4* 8.3* 8.4* 8.4*  PHOS 3.0 2.5 2.0*  --   --    Liver Function Tests: Recent Labs  Lab 09/05/19 0906 09/06/19 0322 09/07/19 0404  ALBUMIN 2.2* 2.2* 2.2*   No results for input(s): LIPASE, AMYLASE in the last 168 hours. CBC: Recent Labs  Lab 09/05/19 0906 09/06/19 0321 09/07/19 0404 09/08/19 0821 09/09/19 0226  WBC 13.9* 15.4* 13.7* 14.9* 15.3*  NEUTROABS 10.5* 11.4* 9.7*  --   --   HGB 9.8* 9.8* 9.0* 9.3* 9.4*  HCT 30.4* 30.2* 29.2* 29.5* 29.4*  MCV 102.7* 101.0* 101.7* 101.7* 100.3*  PLT 320 355 298 342 282   Blood Culture    Component Value Date/Time   SDES PERICARDIAL 08/31/2019 1429   SPECREQUEST NONE 08/31/2019 1429   CULT  08/31/2019 1429    No growth aerobically or anaerobically. Performed at [facility], [place] [address] [place], [place] [address]    REPTSTATUS 09/05/2019 FINAL 08/31/2019 1429    Cardiac Enzymes: No results for input(s): CKTOTAL, CKMB, CKMBINDEX, TROPONINI in the last 168  hours. CBG: Recent Labs  Lab 09/09/19 1134 09/09/19 1739 09/09/19 2138 09/10/19 0629 09/10/19 1211  GLUCAP 99 99 92 75 73   Iron Studies: No results for input(s): IRON, TIBC, TRANSFERRIN, FERRITIN in the last 72 hours. @lablastinr3 @ Studies/Results: No results found. Medications:  . amiodarone  200 mg Oral BID   Followed by  . [START ON 09/12/2019] amiodarone  200 mg Oral Daily  . apixaban  5 mg Oral BID  . calcitRIOL  0.75 mcg Oral Q M,W,F-HD  . Chlorhexidine Gluconate Cloth  6 each Topical Q0600  . colchicine  0.3 mg Oral Q M,W,F-HD  . darbepoetin (ARANESP) injection - NON-DIALYSIS  60 mcg Subcutaneous Q Fri-1800  . docusate sodium  100 mg Oral BID  . gabapentin  100 mg Oral QHS  . insulin aspart  0-9 Units Subcutaneous TID WC  . lidocaine  1 patch Transdermal Q24H  . mouth rinse  15 mL Mouth Rinse BID  . midodrine  10 mg Oral TID WC  . multivitamin  1 tablet Oral QHS  . sucroferric oxyhydroxide  1,000 mg Oral TID WC & HS     Dialysis: [facility] MWF  4h 200Re 450/800 99kg 2/2 bath Hep 10000 L AVF Parsabiv 15 Calcitriol 0.75 Recent OP Labs: Hgb 12.2 K 4.7 Ca 8.3 Phos  3.9 PTH 1960  Assessment/Plan: 1. Pericardial effusion/ tamponade/ acute pericarditis - EKG with diffuse ST elevation. Echo showed LVEF of 60-65%, normal RV function. CP improved but hypotension worsened and pt taken for pericardial window on 08/31/19. BP's improved w/ drainage of effusion. Per Card surg and cardiology. Drain out 10/3, repeat echo with small effusion and no evidence of tamponade. On colchicine MWF x 3 months, recommend getting a full CBC with plts at least twice monthly on HD to ensure no agranulocytosis.    2. Hypertension/volume:chronic BP issue on midodrine.Probing for EDW  3. ESRD:HD MWF. HD on schecdule 10/7Was on eliquis at home and heparin 10K with HD--> hasn't been needing a prn bolus, will d/c.  Given effusion- will need to ensure accurate EDW achieved and will probe  for EDW.  Also may need to re-evaluate OP prescription, pt initially had a run time of 4:30, may need to add back 15 min.  4. L AVF aneurysms: evaluated by Dr. Donnetta Hutching, reports it is safe to use access and to avoid areas of superficial blistering.    5. Atrial fib: Converted to NSR. Amio and Eliquis  6. Anemia ckd + abl: Hb low 9's, will start on darbe 60 ug 1st dose today 10/3, then weekly on Friday.   7. Metabolic bone disease: Continue bindersand calcitriol. On Parasbivfor 2HPTH- not availablein hospital. Resume at discharge.  8. NSVT: per cardiology, no BB d/t requiring midodrine. Already on 3K bath.  9. Dispo: CIR- from renal perspective can go anytime   [employer] Brenda Collazos NP-C 09/10/2019, 12:55 PM  [employer] [phone]

## 2019-09-10 NOTE — Evaluation (Signed)
Occupational Therapy Assessment and Plan  Patient Details  Name: Brenda Arnold MRN: [id number] Date of Birth: 14-May-1956  OT Diagnosis: muscle weakness (generalized) Rehab Potential: Rehab Potential (ACUTE ONLY): Excellent ELOS: 7-9 days   Today's Date: 09/10/2019 OT Individual Time: 1062-6948 OT Individual Time Calculation (min): 60 min     Problem List:  Patient Active Problem List   Diagnosis Date Noted  . Physical debility 09/09/2019  . Pericardial effusion   . SOB (shortness of breath)   . Leukocytosis   . Anemia of chronic disease   . PAF (paroxysmal atrial fibrillation) ([place])   . Postoperative pain   . Cardiac tamponade   . Chest pain at rest 08/26/2019  . Tachycardia 08/26/2019  . Pericarditis 08/26/2019  . Angina pectoris, unspecified ([place]) 062/23/2020  . CAD (coronary artery disease) 11/05/2018  . A-fib ([place]) 11/05/2018  . Atrial fibrillation with RVR ([place])   . Hypotension 11/04/2018  . ESRD on hemodialysis ([place]) 11/04/2018  . DVT (deep venous thrombosis) ([place]) 11/03/2018  . Primary osteoarthritis of right hip 09/15/2018  . Status post total replacement of right hip 09/15/2018  . Varicose veins of bilateral lower extremities with other complications 54/62/7035  . Anticoagulation goal of INR 2 to 3 09/30/2014  . Pain in lower limb 05/02/2014  . Onychomycosis due to dermatophyte 04/06/2013  . Pain in joint, ankle and foot 04/06/2013  . Bradycardia 11/06/2012  . Left-sided epistaxis 11/05/2012  . DM (diabetes mellitus) ([place]) 11/05/2012  . OSA (obstructive sleep apnea) 11/05/2012  . Acute posterior epistaxis 11/05/2012  . CKD (chronic kidney disease) 11/05/2012  . End stage renal disease ([place]) 06/25/2012  . Type 2 diabetes mellitus with complication, without long-term current use of insulin ([place]) 12/20/2009  . OBESITY 12/20/2009  . OBESITY-MORBID (>100') 12/20/2009  . Essential hypertension 12/20/2009  . Paroxysmal atrial fibrillation ([place]) 12/20/2009  .  Chest pain 12/20/2009  . ABNORMAL CV (STRESS) TEST 12/20/2009    Past Medical History:  Past Medical History:  Diagnosis Date  . Anemia   . Arthritis   . Asthma   . Complication of anesthesia    difficulty with getting oxygen saturation up  . Diabetes mellitus   . Dyslipidemia   . Epistaxis 11/05/2012  . ESRD (end stage renal disease) on dialysis [facility])    "MWF; [facility]" (09/15/2018)  . History of nuclear stress test    Myoview 5/18: EF 68, no infarct or ischemia, low risk  . HTN (hypertension)   . Hyperlipidemia   . Hyperparathyroidism due to renal insufficiency ([place])   . Obesity    s/p panniculectomy  . Paroxysmal A-fib ([facility])    a. chronic coumadin;  b. 12/2009 Echo: EF 60-65%, Gr 1 DD.  Marland Kitchen PCOS (polycystic ovarian syndrome)   . Pericarditis 08/2019  . Pneumonia   . Seasonal allergies   . Sleep apnea    a. not using CPAP, last study  >8 yrs  . Vitamin D deficiency    Past Surgical History:  Past Surgical History:  Procedure Laterality Date  . ABDOMINAL HYSTERECTOMY     with panniculctomy  . AV FISTULA PLACEMENT  09/02/2012   Procedure: ARTERIOVENOUS (AV) FISTULA CREATION;  Surgeon: Elam Dutch, MD;  [facility] OR;  Service: Vascular;  Laterality: Left;  Creation of Left Radial-Cephalic Fistula   . BREAST SURGERY     Biopsy right breast  . COLONOSCOPY W/ BIOPSIES AND POLYPECTOMY    . DILATION AND CURETTAGE OF UTERUS    . KNEE  ARTHROSCOPY Right   . REVISON OF ARTERIOVENOUS FISTULA Left 04/27/2014   Procedure: REVISON OF LEFT RADIAL-CEPHALIC ARTERIOVENOUS FISTULA;  Surgeon: Mal Misty, MD;  Location: [place];  Service: Vascular;  Laterality: Left;  . TOTAL HIP ARTHROPLASTY Right 09/15/2018  . TOTAL HIP ARTHROPLASTY Right 09/15/2018   Procedure: RIGHT TOTAL HIP ARTHROPLASTY ANTERIOR APPROACH;  Surgeon: Leandrew Koyanagi, MD;  Location: [place];  Service: Orthopedics;  Laterality: Right;  . UVULOPLASTY    . VIDEO ASSISTED THORACOSCOPY (VATS)/WEDGE RESECTION Right  08/31/2019   Procedure: VIDEO ASSISTED THORACOSCOPY/ DRAINAGE OF PERICARDIAL EFFUSION/ PERICARDIAL WINDOW/ ABORTED PERICARDIOCENTESIS ;  Surgeon: Lajuana Matte, MD;  Location: [facility] OR;  Service: Thoracic;  Laterality: Right;    Assessment & Plan Clinical Impression: Patient is a 63 y.o. year old female with  history of T2DM, PAF, ESRD- HD MWF, OA bilateral hips s/p R-THR and need of L-THR; who was admitted on 62/23/2020 with chest pain and abnormal EKG found to be secondary to pericarditis.  History taken from chart review and patient.  CT chest negative for aneurysm or PE.  She had an echocardiogram, which showed an ejection fraction of 60-65% with no pericardial effusion.  She was started on colchicine.  She later developed atrial fibrillation with RVR with heart rate of 160-190.  She was started on amiodarone on 08/07/2019.  She had worsening of hypotension with CP on 09/29, was found to have large pericardial effusion with concerns of tamponade.  She was taken to the OR for right VATS with pericardial window by CT surgery. Post procedure hypotension treated with IV albumin and recurrent A fib with tachycardia treated with amio bolus on 9/30.  Follow up echo of 10/2 showed small pericardial effusion lateral to LV without tamponade and moderate left lateral pleural effusion and pericardial tube d/c 10/3.  She did have NSVT  <15 beats with SOB with activity 10/05  but back in NSR --no BB due to low blood pressures.  Mild pleuritic pain and bouts of tachycardia with activity decreasing. Therapy ongoing --patient noted to have balance deficits as well as DOE and tachycardia with activity. CIR recommended due to functional decline.   Patient transferred to CIR on 09/09/2019 .    Patient currently requires min with basic self-care skills secondary to muscle weakness, decreased cardiorespiratoy endurance and decreased balance strategies.  Prior to hospitalization, patient could complete ADLs and IADLs with  modified independent .  Patient will benefit from skilled intervention to increase independence with basic self-care skills and increase level of independence with iADL prior to discharge home independently, reports mother will most likely come up from [facility].  Anticipate patient will require no supervision and no further OT follow recommended.  OT - End of Session Activity Tolerance: Tolerates 30+ min activity with multiple rests Endurance Deficit: Yes Endurance Deficit Description: requires frequent rest breaks during mobility and standing OT Assessment Rehab Potential (ACUTE ONLY): Excellent OT Patient demonstrates impairments in the following area(s): Balance;Endurance;Pain;Safety OT Basic ADL's Functional Problem(s): Grooming;Bathing;Dressing;Toileting OT Advanced ADL's Functional Problem(s): Simple Meal Preparation;Light Housekeeping OT Transfers Functional Problem(s): Toilet;Tub/Shower OT Additional Impairment(s): None OT Plan OT Intensity: Minimum of 1-2 x/day, 45 to 90 minutes OT Frequency: 5 out of 7 days OT Duration/Estimated Length of Stay: 7-9 days OT Treatment/Interventions: Medical illustrator training;Community reintegration;Discharge planning;Disease mangement/prevention;DME/adaptive equipment instruction;Functional mobility training;Pain management;Patient/family education;Psychosocial support;Self Care/advanced ADL retraining;Therapeutic Activities;Therapeutic Exercise;UE/LE Strength taining/ROM OT Basic Self-Care Anticipated Outcome(s): Mod I OT Toileting Anticipated Outcome(s): Mod I OT Bathroom Transfers Anticipated Outcome(s): Mod  I OT Recommendation Patient destination: Home Follow Up Recommendations: Home health OT Equipment Recommended: 3 in 1 bedside comode   Skilled Therapeutic Intervention OT eval completed with discussion of rehab process, OT purpose, POC, ELOS, and goals. ADL assessment completed with bathing and dressing at sit > stand level.  Pt completed  bathing in room shower from tub bench, pt reports having tub/shower combo at home with tub bench.  Overall min guard for all mobility, transfers, and dynamic standing balance.  Pt reports decreased endurance and feels "winded" with most activity.  Pt required assistance for donning hospital socks, but able to wash lower legs including feet.  Pt reports having sock aid at home.  Pt very motivated to progress and wants to get back home.    OT Evaluation Precautions/Restrictions  Precautions Precautions: Fall Restrictions Weight Bearing Restrictions: No Pain Pain Assessment Pain Scale: 0-10 Pain Score: 0-No pain Home Living/Prior Functioning Home Living Family/patient expects to be discharged to:: Private residence Living Arrangements: Alone Available Help at Discharge: Other (Comment)(82 yo mother in [place] likely to come stay with her) Type of Home: House Home Access: Stairs to enter [employer] of Steps: 1 step up inside from the back (main entrance), and 4-5 steps in front Entrance Stairs-Rails: None Home Layout: One level Bathroom Shower/Tub: Chiropodist: Standard Bathroom Accessibility: Yes  Lives With: Alone IADL History Homemaking Responsibilities: Yes Meal Prep Responsibility: Primary Laundry Responsibility: Primary Cleaning Responsibility: Primary Bill Paying/Finance Responsibility: Primary Shopping Responsibility: Primary Current License: Yes Occupation: Full time employment Prior Function Level of Independence: Independent with basic ADLs, Independent with homemaking with ambulation, Requires assistive device for independence  Able to Take Stairs?: Yes Driving: Yes Vocation: Full time employment Vocation Requirements: works from [employer] Comments: Pt reports completing ADLs using RW for functional mobility, tub bench in the shower, sock aide for socks; drives to dialysis ADL ADL Grooming: Contact guard Where  Assessed-Grooming: Standing at sink Upper Body Bathing: Supervision/safety Where Assessed-Upper Body Bathing: Shower Lower Body Bathing: Contact guard Where Assessed-Lower Body Bathing: Shower Upper Body Dressing: Setup Where Assessed-Upper Body Dressing: Sitting at sink Lower Body Dressing: Minimal assistance Where Assessed-Lower Body Dressing: Sitting at sink, Standing at sink Toileting: Minimal assistance Where Assessed-Toileting: Glass blower/designer: Psychiatric nurse Method: Psychologist, educational: Curator Method: Radiographer, therapeutic: Radio broadcast assistant, Grab bars Vision Baseline Vision/History: Wears glasses Wears Glasses: At all times Patient Visual Report: No change from baseline Vision Assessment?: No apparent visual deficits Cognition Overall Cognitive Status: Within Functional Limits for tasks assessed Arousal/Alertness: Awake/alert Orientation Level: Person;Place;Situation Person: Oriented Place: Oriented Situation: Oriented Year: 2020 Month: October Day of Week: Correct Memory: Appears intact Immediate Memory Recall: Sock;Blue;Bed Memory Recall Sock: Without Cue Memory Recall Blue: Without Cue Memory Recall Bed: Without Cue Attention: Alternating;Selective Selective Attention: Appears intact Awareness: Appears intact Problem Solving: Appears intact Safety/Judgment: Appears intact Sensation Sensation Light Touch: Appears Intact Proprioception: Appears Intact Coordination Gross Motor Movements are Fluid and Coordinated: No Fine Motor Movements are Fluid and Coordinated: Yes Mobility  Transfers Sit to Stand: Contact Guard/Touching assist  Extremity/Trunk Assessment RUE Assessment RUE Assessment: Within Functional Limits LUE Assessment LUE Assessment: Within Functional Limits     Refer to Care Plan for Long Term Goals  Recommendations for other services: None     Discharge Criteria: Patient will be discharged from OT if patient refuses treatment 3 consecutive times without medical reason, if treatment goals not  met, if there is a change in medical status, if patient makes no progress towards goals or if patient is discharged from hospital.  The above assessment, treatment plan, treatment alternatives and goals were discussed and mutually agreed upon: by patient  Ellwood Dense [facility] 09/10/2019, 9:35 AM

## 2019-09-11 ENCOUNTER — Inpatient Hospital Stay (HOSPITAL_COMMUNITY): Payer: BC Managed Care – PPO | Admitting: Occupational Therapy

## 2019-09-11 ENCOUNTER — Inpatient Hospital Stay (HOSPITAL_COMMUNITY): Payer: BC Managed Care – PPO | Admitting: Physical Therapy

## 2019-09-11 LAB — COMPREHENSIVE METABOLIC PANEL
ALT: 16 U/L (ref 0–44)
AST: 26 U/L (ref 15–41)
Albumin: 2.7 g/dL — ABNORMAL LOW (ref 3.5–5.0)
Alkaline Phosphatase: 108 U/L (ref 38–126)
Anion gap: 14 (ref 5–15)
BUN: 16 mg/dL (ref 8–23)
CO2: 28 mmol/L (ref 22–32)
Calcium: 8.8 mg/dL — ABNORMAL LOW (ref 8.9–10.3)
Chloride: 93 mmol/L — ABNORMAL LOW (ref 98–111)
Creatinine, Ser: 6.13 mg/dL — ABNORMAL HIGH (ref 0.44–1.00)
GFR calc Af Amer: 8 mL/min — ABNORMAL LOW (ref >60)
GFR calc non Af Amer: 7 mL/min — ABNORMAL LOW (ref >60)
Glucose, Bld: 84 mg/dL (ref 70–99)
Potassium: 4.2 mmol/L (ref 3.5–5.1)
Sodium: 135 mmol/L (ref 135–145)
Total Bilirubin: 0.8 mg/dL (ref 0.3–1.2)
Total Protein: 6.1 g/dL — ABNORMAL LOW (ref 6.5–8.1)

## 2019-09-11 LAB — GLUCOSE, CAPILLARY
Glucose-Capillary: 76 mg/dL (ref 70–99)
Glucose-Capillary: 87 mg/dL (ref 70–99)
Glucose-Capillary: 95 mg/dL (ref 70–99)
Glucose-Capillary: 100 mg/dL — ABNORMAL HIGH (ref 70–99)

## 2019-09-11 LAB — CBC WITH DIFFERENTIAL/PLATELET
Abs Immature Granulocytes: 0.09 10*3/uL — ABNORMAL HIGH (ref 0.00–0.07)
Basophils Absolute: 0 10*3/uL (ref 0.0–0.1)
Basophils Relative: 0 %
Eosinophils Absolute: 0.1 10*3/uL (ref 0.0–0.5)
Eosinophils Relative: 1 %
HCT: 27.1 % — ABNORMAL LOW (ref 36.0–46.0)
Hemoglobin: 8.9 g/dL — ABNORMAL LOW (ref 12.0–15.0)
Immature Granulocytes: 1 %
Lymphocytes Relative: 13 %
Lymphs Abs: 1.4 10*3/uL (ref 0.7–4.0)
MCH: 32.2 pg (ref 26.0–34.0)
MCHC: 32.8 g/dL (ref 30.0–36.0)
MCV: 98.2 fL (ref 80.0–100.0)
Monocytes Absolute: 1.4 10*3/uL — ABNORMAL HIGH (ref 0.1–1.0)
Monocytes Relative: 14 %
Neutro Abs: 7.4 10*3/uL (ref 1.7–7.7)
Neutrophils Relative %: 71 %
Platelets: 239 10*3/uL (ref 150–400)
RBC: 2.76 MIL/uL — ABNORMAL LOW (ref 3.87–5.11)
RDW: 16.4 % — ABNORMAL HIGH (ref 11.5–15.5)
WBC: 10.4 10*3/uL (ref 4.0–10.5)
nRBC: 0 % (ref 0.0–0.2)

## 2019-09-11 NOTE — Progress Notes (Signed)
[place] PHYSICAL MEDICINE & REHABILITATION PROGRESS NOTE   Subjective/Complaints:  Per RN unable to cath, pain and "tight"  Pt feels like she is in Afib this am     ROS: denies CP, SOB, N/V/D   Objective:   Dg Chest 2 View  Result Date: 09/10/2019 CLINICAL DATA:  Leukocytosis. EXAM: CHEST - 2 VIEW COMPARISON:  September 07, 2019 FINDINGS: Enlarged cardiac silhouette. Calcific atherosclerotic disease of the aorta. Bilateral pleural effusions. Bilateral lower lobe, left greater than right, airspace consolidation versus atelectasis. Osseous structures are without acute abnormality. Soft tissues are grossly normal. IMPRESSION: 1. Bilateral pleural effusions. 2. Bilateral lower lobe, left greater than right, airspace consolidation versus atelectasis. 3. Calcific atherosclerotic disease of the aorta. Electronically Signed   By: Fidela Salisbury M.D.   On: 09/10/2019 20:55   Recent Labs    09/09/19 0226 09/11/19 0532  WBC 15.3* 10.4  HGB 9.4* 8.9*  HCT 29.4* 27.1*  PLT 282 239   Recent Labs    09/09/19 0226 09/11/19 0532  NA 134* 135  K 4.1 4.2  CL 95* 93*  CO2 24 28  GLUCOSE 76 84  BUN 17 16  CREATININE 5.79* 6.13*  CALCIUM 8.4* 8.8*    Intake/Output Summary (Last 24 hours) at 09/11/2019 0744 Last data filed at 09/10/2019 1805 Gross per 24 hour  Intake 360 ml  Output 2500 ml  Net -2140 ml     Physical Exam: Vital Signs Blood pressure (!) 103/50, pulse 80, temperature 99.2 F (37.3 C), temperature source Oral, resp. rate 17, weight 101.4 kg, SpO2 96 %.  Physical Exam  Nursing note, labs, and vitals reviewed. Constitutional: pt awake, alert, appropriate,Obese woman; appears younger than stated age  HENT:  Head: Normocephalic and atraumatic.  Cor RRR no murmur or rub          Eyes:EOMI, no injection Neck: No tracheal deviation present. No thyromegaly present.  Respiratory: CTA B/L- no W/R/R Right prior CT suture under right breast.    GI: abd protuberant,  soft, NT, ND, (+)hypoactive BS  Musculoskeletal:        General: No edema.     Comments: Left wrist with graft - palpable  Neurological: She is alert and oriented to person, place, and time.  Motor: Grossly 5/5 throughout  Skin: Skin is warm and dry. No rash noted. No erythema.  Vascular changes b/l LE  Psychiatric: full, affect      Assessment/Plan: 1. Functional deficits secondary to debility due to pericarditis/pericardial tamponade which require 3+ hours per day of interdisciplinary therapy in a comprehensive inpatient rehab setting.  Physiatrist is providing close team supervision and 24 hour management of active medical problems listed below.  Physiatrist and rehab team continue to assess barriers to discharge/monitor patient progress toward functional and medical goals  Care Tool:  Bathing    Body parts bathed by patient: Right arm, Left arm, Chest, Abdomen, Front perineal area, Buttocks, Right upper leg, Left upper leg, Right lower leg, Left lower leg, Face         Bathing assist Assist Level: Contact Guard/Touching assist     Upper Body Dressing/Undressing Upper body dressing   What is the patient wearing?: Hospital gown only    Upper body assist Assist Level: Set up assist    Lower Body Dressing/Undressing Lower body dressing      What is the patient wearing?: Pants     Lower body assist Assist for lower body dressing: Minimal Assistance - Patient > 75%  Toileting Toileting    Toileting assist Assist for toileting: Minimal Assistance - Patient > 75%     Transfers Chair/bed transfer  Transfers assist     Chair/bed transfer assist level: Contact Guard/Touching assist Chair/bed transfer assistive device: Programmer, multimedia   Ambulation assist      Assist level: Contact Guard/Touching assist Assistive device: Walker-rolling Max distance: 159ft   Walk 10 feet activity   Assist     Assist level: Contact  Guard/Touching assist Assistive device: Walker-rolling   Walk 50 feet activity   Assist    Assist level: Contact Guard/Touching assist Assistive device: Walker-rolling    Walk 150 feet activity   Assist Walk 150 feet activity did not occur: Safety/medical concerns         Walk 10 feet on uneven surface  activity   Assist     Assist level: Contact Guard/Touching assist(ramp) Assistive device: Aeronautical engineer Will patient use wheelchair at discharge?: No             Wheelchair 50 feet with 2 turns activity    Assist            Wheelchair 150 feet activity     Assist          Blood pressure (!) 103/50, pulse 80, temperature 99.2 F (37.3 C), temperature source Oral, resp. rate 17, weight 101.4 kg, SpO2 96 %.  Medical Problem List and Plan: 1.  Debility secondary to pericarditis with pericardial tamponade.  CIR PT,  OT  2.  Antithrombotics: -DVT/anticoagulation:  Pharmaceutical: Other (comment)--Eliquis             -antiplatelet therapy: N/A 3. Left hip pain/Pain Management:  On gabapentin 100 mg/hs. Tylenol prn. Will add lidocaine patch for pain control.              Monitor with increased mobility 4. Mood: Very motivated to work hard and get home. LCSW to follow for evaluation and support.             -antipsychotic agents: N/A 5. Neuropsych: This patient is capable of making decisions on her own behalf. 6. Skin/Wound Care: Routine pressure relief measures.  7. Fluids/Electrolytes/Nutrition: Strict I/O. 1200 cc FR/day. Labs with HP. 8. Pericarditis: On Colchicine MWF X 3 months. Monitor for agranulocytosis.   9. T2DM: Hgb A 1c- 4.9. Blood sugars tightly controlled on SSI.    CBG (last 3)  Recent Labs    09/10/19 1922 09/10/19 2110 09/11/19 0557  GLUCAP 73 111* 76    10/9- great control             Monitor with increased mobility 10. PAF: Now in NSR on amiodarone 200 mg bid--->taper to 200 mg daily  starting 10/11.               Last EKG 10/3 Afib and RVR, per exam is regular rate nd rythmn on Eliquis for CVA prophyllaxis no repeat EKG needed at this time  11. Chronic hypotension: Baseline SBP 80-90's range on midodrine tid. Reports can drop to 50's with HD.              Monitor for signs and symptoms Ace wraps/teds/abdominal binder if necessary. 12.  ESRD: HD MWF at end of the day to help with tolerance of therapy.  13. Anemia of chronic disease: Being monitored. On weekly Aranesp.  Labs with HD. 14. Leucocytosis: resolved , unable to void and  difficult cath  d/c UA C and S . 15.  Morbidly obese: Encouraged weight loss     LOS: 2 days A FACE TO FACE EVALUATION WAS PERFORMED  Charlett Blake 09/11/2019, 7:44 AM

## 2019-09-11 NOTE — Progress Notes (Addendum)
Physical Therapy Session Note  Patient Details  Name: Brenda Arnold MRN: [id number] Date of Birth: 22-Aug-1956  Today's Date: 09/11/2019 PT Individual Time: [id number] PT Individual Time Calculation (min): 63 min   Short Term Goals: Week 1:  PT Short Term Goal 1 (Week 1): = to LTGs based on ELOS  Skilled Therapeutic Interventions/Progress Updates:   Pt received asleep, supine in bed but easily awakens and is agreeable to therapy session. Pt reports that she prefers RW opposed to the rollator because she can lift RW and put it in/out of the vehicle and the rollator is too heavy. Supine>sit, HOB elevated and using bedrails, mod-I. Sit<>stand using RW with CGA/close supervision for steadying/safety. Ambulated ~71ft to bathroom using RW with CGA for steadying. Sit<>stand on/off toilet using grab bars with CGA for steadying. Pt continent of small BM. Standing hand hygiene at sink with close supervision - mod cuing for proper AD management at sink. Ambulated ~143ft x2 (seated break) using RW to therapy gym with CGA for steadying. Performed R LE and L LE step up/down on 4" step using B UE support on RW with CGA for steadying focusing on endurance and B LE strengthening 2 sets each LE to fatigue - pt's HR elevated to 99bpm with SpO2 100% on RA. Transported to day room in w/c. Stand pivot to/from Nustep using RW with close supervision for safety. Performed B UE and B LE alternating movements on Nustep focusing on increased activity tolerance against level 4 increased to level 6 resistance for 5 minutes maintaining steps per minute >45 totaling 260steps - pt's Borg RPE was 11 with HR of 93bpm and SpO2 of 99%. Transported back to room in w/c. Stand pivot w/c>EOB using RW with close supervision. Sit>supine, HOB elevated and using bedrails, mod-I. Pt left sitting upright in bed with bed alarm on, needs in reach, and meal tray set-up.  Addendum: During toileting pt noted to have some blood on toilet paper during  anterior peri-care and pt stated she believes it is due to the in/out cath last night - RN notified.    Therapy Documentation Precautions:  Precautions Precautions: Fall Restrictions Weight Bearing Restrictions: No  Pain:   Denies pain during session.   Therapy/Group: Individual Therapy  Tawana Scale, PT, DPT 09/11/2019, 5:10 PM

## 2019-09-11 NOTE — Progress Notes (Addendum)
This writer went in to get an UA from patient. Staff unable to obtain sample. Patient did not tolerate well due to anxiety and complaints of pain.  Patient had discharge from vagina , thick and white, but no smell.

## 2019-09-11 NOTE — Discharge Instructions (Addendum)
Inpatient Rehab Discharge Instructions  Brenda Arnold Discharge date and time: 09/17/19   Activities/Precautions/ Functional Status: Activity: no lifting, driving, or strenuous exercise till cleared by MD Diet: renal diet Wound Care: none needed and keep wound clean and dry.   Functional status:  ___ No restrictions     ___ Walk up steps independently _X__ 24/7 supervision/assistance   ___ Walk up steps with assistance ___ Intermittent supervision/assistance  ___ Bathe/dress independently ___ Walk with walker     ___ Bathe/dress with assistance ___ Walk Independently    ___ Shower independently ___ Walk with assistance    _X__ Shower with assistance _X__ No alcohol     ___ Return to work/school ________   Special Instructions:  COMMUNITY REFERRALS UPON DISCHARGE:    Outpatient: PT  Agency:[facility] Phone:[phone]   Date of Last Service:09/18/2019  Appointment Date/Time:CALL PATIENT TO SET UP APPOINTMENT  Medical Equipment/Items Ordered:NO NEEDS    My questions have been answered and I understand these instructions. I will adhere to these goals and the provided educational materials after my discharge from the hospital.  Patient/Caregiver Signature _______________________________ Date __________  Clinician Signature _______________________________________ Date __________  Please bring this form and your medication list with you to all your follow-up doctor's appointments.   FOR SAFETY, 24 HOUR SUPERVISION IS RECOMMENDED UNTIL OTHERWISE DIRECTED BY A PHYSICIAN.  ABSOLUTELY NO DRIVING UNTIL OTHERWISE DIRECTED BY A PHYSICIAN. CAR KEYS SHOULD BE HIDDEN IN A SAFE LOCATION IF NEEDED.  DUE TO RISK OF INJURY, PLEASE REMOVE GUNS, KNIVES, OR ANY OTHER POTENTIALLY DANGEROUS OBJECTS AND HAZARDOUS MATERIALS FROM THE HOME. Information on my medicine - ELIQUIS (apixaban)  This medication education was reviewed with me or my healthcare representative as  part of my discharge preparation.   Why was Eliquis prescribed for you? Eliquis was prescribed for you to reduce the risk of a blood clot forming that can cause a stroke if you have a medical condition called atrial fibrillation (a type of irregular heartbeat).  What do You need to know about Eliquis ? Take your Eliquis TWICE DAILY - one tablet in the morning and one tablet in the evening with or without food. If you have difficulty swallowing the tablet whole please discuss with your pharmacist how to take the medication safely.  Take Eliquis exactly as prescribed by your doctor and DO NOT stop taking Eliquis without talking to the doctor who prescribed the medication.  Stopping may increase your risk of developing a stroke.  Refill your prescription before you run out.  After discharge, you should have regular check-up appointments with your healthcare provider that is prescribing your Eliquis.  In the future your dose may need to be changed if your kidney function or weight changes by a significant amount or as you get older.  What do you do if you miss a dose? If you miss a dose, take it as soon as you remember on the same day and resume taking twice daily.  Do not take more than one dose of ELIQUIS at the same time to make up a missed dose.  Important Safety Information A possible side effect of Eliquis is bleeding. You should call your healthcare provider right away if you experience any of the following: ? Bleeding from an injury or your nose that does not stop. ? Unusual colored urine (red or dark brown) or unusual colored stools (red or black). ? Unusual bruising for unknown reasons. ? A serious fall or if you hit  your head (even if there is no bleeding).  Some medicines may interact with Eliquis and might increase your risk of bleeding or clotting while on Eliquis. To help avoid this, consult your healthcare provider or pharmacist prior to using any new prescription or  non-prescription medications, including herbals, vitamins, non-steroidal anti-inflammatory drugs (NSAIDs) and supplements.  This website has more information on Eliquis (apixaban): http://www.eliquis.com/eliquis/home

## 2019-09-11 NOTE — Progress Notes (Signed)
Occupational Therapy Session Note  Patient Details  Name: Brenda Arnold MRN: [id number] Date of Birth: January 21, 1956  Today's Date: 09/11/2019 OT Individual Time: 1120-1205 and 1430-1500 OT Individual Time Calculation (min): 45 min and 30 min   Short Term Goals: Week 1:  OT Short Term Goal 1 (Week 1): STG = LTGs due to ELOS  Skilled Therapeutic Interventions/Progress Updates:    1) Treatment session with focus on self-care retraining and endurance.  Pt received upright in w/c reporting feeling more fatigued this AM but agreeable to therapy session.  Pt reports instances of A fib this AM and feelings of fatigue.  Pt reports already "washing up" this AM but completed dressing with setup assist and supervision when standing for LB dressing without UE support.  Pt ambulated 140' with RW with focus on endurance and upright posture.  Therapist followed with w/c for safety, pt did not utilize but required 1 standing rest break about halfway.    2) Treatment session with focus on endurance during functional mobility and simulated homemaking task.  Pt received supine in bed reporting BLE fatigue from sitting up prolonged period this AM, but agreeable to therapy session.  Pt ambulated 160' with RW with focus on increased activity tolerance/endurance.  Therapist did not follow with w/c this session, provided intermittent CGA to supervision during ambulation.  Pt required 1 standing rest break at 74' and then returned to room.  Engaged in dynamic standing activity at table top with focus on endurance with standing and reaching to simulate homemaking tasks (laundry, washing/putting away dishes, putting away groceries).  Supervision provided during standing with no LOB.  Pt able to self-select rest breaks as needed and discussed typical routine at home with rest breaks throughout tasks and energy conservation with sitting for various aspects of task.  Pt returned to bed at end of session to rest before next  therapy session.  Therapy Documentation Precautions:  Precautions Precautions: Fall Restrictions Weight Bearing Restrictions: No General:   Vital Signs: Therapy Vitals Temp: 97.7 F (36.5 C) Temp Source: Oral Pulse Rate: 77 Resp: 18 BP: (!) 99/49 Patient Position (if appropriate): Lying Oxygen Therapy SpO2: 100 % O2 Device: Room Air Pain:  Pt with no c/o pain in AM or PM session.   Therapy/Group: Individual Therapy  Simonne Come 09/11/2019, 3:33 PM

## 2019-09-11 NOTE — IPOC Note (Signed)
Overall Plan of Care [facility]) Patient Details Name: Brenda Arnold MRN: [id number] DOB: 1956-04-28  Admitting Diagnosis: Physical debility  Hospital Problems: Principal Problem:   Physical debility Active Problems:   Type 2 diabetes mellitus with complication, without long-term current use of insulin ([place])   Status post total replacement of right hip   Hypotension   ESRD on hemodialysis ([place])   Leukocytosis     Functional Problem List: Nursing Endurance, Medication Management, Safety  PT Balance, Endurance, Motor, Pain, Safety  OT Balance, Endurance, Pain, Safety  SLP    TR         Basic ADL's: OT Grooming, Bathing, Dressing, Toileting     Advanced  ADL's: OT Simple Meal Preparation, Light Housekeeping     Transfers: PT Bed Mobility, Bed to Chair, Car, Manufacturing systems engineer, Metallurgist: PT Ambulation, Stairs     Additional Impairments: OT None  SLP        TR      Anticipated Outcomes Item Anticipated Outcome  Self Feeding    Swallowing      Basic self-care  Mod I  Toileting  Mod I   Bathroom Transfers Mod I  Bowel/Bladder  na  Transfers  mod-I with LRAD  Locomotion  mod-I with LRAD  Communication     Cognition     Pain  na  Safety/Judgment  Maintain safety with cues and reminders   Therapy Plan: PT Intensity: Minimum of 1-2 x/day ,45 to 90 minutes PT Frequency: 5 out of 7 days PT Duration Estimated Length of Stay: 5-7 days OT Intensity: Minimum of 1-2 x/day, 45 to 90 minutes OT Frequency: 5 out of 7 days OT Duration/Estimated Length of Stay: 7-9 days     Due to the current state of emergency, patients may not be receiving their 3-hours of Medicare-mandated therapy.   Team Interventions: Nursing Interventions Patient/Family Education, Disease Management/Prevention, Medication Management, Discharge Planning  PT interventions Ambulation/gait training, Community reintegration, DME/adaptive equipment instruction,  Neuromuscular re-education, Psychosocial support, Stair training, UE/LE Strength taining/ROM, Training and development officer, Discharge planning, Functional electrical stimulation, Pain management, Skin care/wound management, Therapeutic Activities, UE/LE Coordination activities, Cognitive remediation/compensation, Disease management/prevention, Functional mobility training, Splinting/orthotics, Patient/family education, Therapeutic Exercise, Visual/perceptual remediation/compensation  OT Interventions Training and development officer, Community reintegration, Discharge planning, Disease mangement/prevention, DME/adaptive equipment instruction, Functional mobility training, Pain management, Patient/family education, Psychosocial support, Self Care/advanced ADL retraining, Therapeutic Activities, Therapeutic Exercise, UE/LE Strength taining/ROM  SLP Interventions    TR Interventions    SW/CM Interventions Discharge Planning, Psychosocial Support, Patient/Family Education   Barriers to Discharge MD  Medical stability  Nursing      PT Decreased caregiver support, Lack of/limited family support, Home environment Child psychotherapist, Inaccessible home environment    OT      SLP      SW       Team Discharge Planning: Destination: PT-Home ,OT- Home , SLP-  Projected Follow-up: PT-Home health PT, OT-  Home health OT, SLP-  Projected Equipment Needs: PT-To be determined, Other (comment), OT- 3 in 1 bedside comode, SLP-  Equipment Details: PT-has RW, OT-  Patient/family involved in discharge planning: PT- Patient,  OT-Patient, SLP-   MD ELOS: 5-7d Medical Rehab Prognosis:  Excellent Assessment:   63 year old female with history of T2DM, PAF, ESRD- HD MWF, OA bilateral hips s/p R-THR and need of L-THR; who was admitted on 08/25/2019 with chest pain and abnormal EKG found to be secondary to pericarditis.  History taken from chart  review and patient.  CT chest negative for aneurysm or PE.  She had an  echocardiogram, which showed an ejection fraction of 60-65% with no pericardial effusion.  She was started on colchicine.  She later developed atrial fibrillation with RVR with heart rate of 160-190.  She was started on amiodarone on 08/07/2019.  She had worsening of hypotension with CP on 09/29, was found to have large pericardial effusion with concerns of tamponade.  She was taken to the OR for right VATS with pericardial window by CT surgery. Post procedure hypotension treated with IV albumin and recurrent A fib with tachycardia treated with amio bolus on 9/30.  Follow up echo of 10/2 showed small pericardial effusion lateral to LV without tamponade and moderate left lateral pleural effusion and pericardial tube d/c 10/3.  She did have NSVT  <15 beats with SOB with activity 10/05  but back in NSR --no BB due to low blood pressures.  Mild pleuritic pain and bouts of tachycardia with activity decreasing. Therapy ongoing --patient noted to have balance deficits as well as DOE and tachycardia with activity  See Team Conference Notes for weekly updates to the plan of care  Now requiring 24/7 Rehab RN,MD, as well as CIR level PT, OT and SLP.  Treatment team will focus on ADLs and mobility with goals set at Mod I

## 2019-09-11 NOTE — Progress Notes (Addendum)
Brenda Arnold Progress Note   Subjective:   Patient seen and examined at bedside.  States she is feeling stronger everyday but continues to have SOB w/exertion.  No other complaints.   Objective Vitals:   09/11/19 0518 09/11/19 0548 09/11/19 0746 09/11/19 1318  BP:  (!) 103/50 (!) 105/49 (!) 99/49  Pulse:  80 91 77  Resp:  17 15 18   Temp:  99.2 F (37.3 C) 99.8 F (37.7 C) 97.7 F (36.5 C)  TempSrc:  Oral Oral Oral  SpO2:  96% 100% 100%  Weight: 101.4 kg      Physical Exam General:NAD, well appearing female, sitting in bed side chair Heart:RRR, no mrg Lungs:CTAB Abdomen:soft, NTND Extremities:no edema Dialysis Access: LU AVF +b/t   Filed Weights   09/10/19 1400 09/10/19 1805 09/11/19 0518  Weight: 105.6 kg 102.5 kg 101.4 kg    Intake/Output Summary (Last 24 hours) at 09/11/2019 1500 Last data filed at 09/11/2019 1316 Gross per 24 hour  Intake 250 ml  Output 2500 ml  Net -2250 ml    Additional Objective Labs: Basic Metabolic Panel: Recent Labs  Lab 09/05/19 0906 09/06/19 0322 09/07/19 0404 09/08/19 0821 09/09/19 0226 09/11/19 0532  NA 137 138 138 136 134* 135  K 4.0 5.0 3.8 3.8 4.1 4.2  CL 100 102 101 100 95* 93*  CO2 25 25 26 27 24 28   GLUCOSE 98 90 85 121* 76 84  BUN 31* 42* 22 36* 17 16  CREATININE 9.05* 10.41* 7.19* 9.77* 5.79* 6.13*  CALCIUM 8.3* 7.4* 8.3* 8.4* 8.4* 8.8*  PHOS 3.0 2.5 2.0*  --   --   --    Liver Function Tests: Recent Labs  Lab 09/06/19 0322 09/07/19 0404 09/11/19 0532  AST  --   --  26  ALT  --   --  16  ALKPHOS  --   --  108  BILITOT  --   --  0.8  PROT  --   --  6.1*  ALBUMIN 2.2* 2.2* 2.7*  CBC: Recent Labs  Lab 09/06/19 0321 09/07/19 0404 09/08/19 0821 09/09/19 0226 09/11/19 0532  WBC 15.4* 13.7* 14.9* 15.3* 10.4  NEUTROABS 11.4* 9.7*  --   --  7.4  HGB 9.8* 9.0* 9.3* 9.4* 8.9*  HCT 30.2* 29.2* 29.5* 29.4* 27.1*  MCV 101.0* 101.7* 101.7* 100.3* 98.2  PLT 355 298 342 282 239   CBG: Recent  Labs  Lab 09/10/19 1903 09/10/19 1922 09/10/19 2110 09/11/19 0557 09/11/19 1119  GLUCAP 59* 73 111* 76 87    Studies/Results: Dg Chest 2 View  Result Date: 09/10/2019 CLINICAL DATA:  Leukocytosis. EXAM: CHEST - 2 VIEW COMPARISON:  September 07, 2019 FINDINGS: Enlarged cardiac silhouette. Calcific atherosclerotic disease of the aorta. Bilateral pleural effusions. Bilateral lower lobe, left greater than right, airspace consolidation versus atelectasis. Osseous structures are without acute abnormality. Soft tissues are grossly normal. IMPRESSION: 1. Bilateral pleural effusions. 2. Bilateral lower lobe, left greater than right, airspace consolidation versus atelectasis. 3. Calcific atherosclerotic disease of the aorta. Electronically Signed   By: Fidela Salisbury M.D.   On: 09/10/2019 20:55    Medications:  . amiodarone  200 mg Oral BID   Followed by  . [START ON 09/12/2019] amiodarone  200 mg Oral Daily  . apixaban  5 mg Oral BID  . calcitRIOL  0.75 mcg Oral Q M,W,F-HD  . Chlorhexidine Gluconate Cloth  6 each Topical Q0600  . colchicine  0.3 mg Oral Q M,W,F-HD  .  darbepoetin (ARANESP) injection - NON-DIALYSIS  60 mcg Subcutaneous Q Fri-1800  . docusate sodium  100 mg Oral BID  . gabapentin  100 mg Oral QHS  . insulin aspart  0-9 Units Subcutaneous TID WC  . lidocaine  1 patch Transdermal Q24H  . mouth rinse  15 mL Mouth Rinse BID  . midodrine  10 mg Oral TID WC  . multivitamin  1 tablet Oral QHS  . sucroferric oxyhydroxide  1,000 mg Oral TID WC & HS    Dialysis Orders: [facility] MWF  4h 200Re 450/800 99kg 2/2 bath Hep 10000 L AVF Parsabiv 15 Calcitriol 0.75 Recent OP Labs: Hgb 12.2 K 4.7 Ca 8.3 Phos 3.9 PTH 1960  Assessment/Plan: 1. Pericardial effusion/tamponade/acute pericarditis - EKG w/diffuse ST elevation. ECHO 9/29 showed LVEF 60-65%, normal RV fxn, enlargeing pericardial effusion and possible early tamponade.  CP improved but worsening hypotension, pt taken for  pericardial window on 08/31/19. BP improved w/effusion drainage.  Per CTS/cards. Repeat echo 10/2 w/small effusion & no evidence tamponage.  Drain removed 10/3. On colchicine MWF x3 months, needs Full CBC w/plts 2x month on HD to ensure no agranulocytosis.  2. Debility - in CIR 3. ESRD - on HD MWF. K 4.2. Next HD on 10/12.  Was on eliquis at home & Hep 10k w/HD, has not required prn bolus, will d/c.  With recent effusion will need to ensure meeting accurate EDW to prevent excess volume.  Also may need to re-evaluate OP Rx, pt initially had run time of 4.5hrs, may need to add back 76min to 4.25hrs.  3. Anemia of CKD- Hgb 8.9. Given aranesp 30mcg on 10/9, continue qwk.  tsat 20%, will start Fe bolus.  4. Secondary hyperparathyroidism - Ca at goal. Phos a little low, decrease binders. Continue VDRA. Parsabiv not on [facility] formulary.  5. Hypotension/volume - chronic hypotension on midodrine. Over EDW per last weights, unsure if they are correct.  Will continue to titrate down as tolerated.   6. Nutrition - Alb 2.7. Renal diet w/fluid restrictions. Protein supplements.  7. L AVF aneurysms: eval by Dr. Donnetta Hutching, reports safe to use access & avoid superficial blistering 8. NSVT: per cards. Already on 3K bath 10 A fib - per primary 11. Dispo: CIR - from renal aspect OK to d/c at anytime  Jen Mow, PA-C [facility] Pager: [phone] 09/11/2019,3:00 PM  LOS: 2 days

## 2019-09-11 NOTE — Progress Notes (Addendum)
Physical Therapy Session Note  Patient Details  Name: Brenda Arnold MRN: [id number] Date of Birth: 07/05/1956  Today's Date: 09/11/2019 PT Individual Time: 0915-1000 PT Individual Time Calculation (min): 45 min   Short Term Goals: Week 1:  PT Short Term Goal 1 (Week 1): = to LTGs based on ELOS  Skilled Therapeutic Interventions/Progress Updates:  Pt sitting upright in wheelchair at the sink in her room, getting ready for the day upon arrival for therapy. Pt agreeable to treatment session. Pt completed self care including combing her hair and washing her face independently. Pt completed sit>stand with CGA and remained standing to wash up requiring supervision assist for safety. Pt returned to wheelchair and transported to therapy gym for treatment. Pt ambulated 100 feet with CGA and RW. Then pt ambulated 100 feet, 50 feet, 50 feet with CGA and rollator. Pt required rest breaks in between each of the trials of gait d/t fatigue and "feeling winded". Pt trialed the rollator vs RW for ambulation during today's session. The rollator is a nice option long term for this pt in case pt needs a seated rest break d/t fatigue while ambulating in her house or in the community. Pt self propelled wheelchair about 75 feet back towards her room. Pt was transported the remaining distance back to her room and left sitting upright in the wheelchair with call bell within reach. All needs met at this time. Pt's vital signs monitored throughout today's session. HR range 86-102bpm and SpO2 range 96-100%.     Therapy Documentation Precautions:  Precautions Precautions: Fall Restrictions Weight Bearing Restrictions: No  Pain: denies pain    Therapy/Group: Individual Therapy  Olena Leatherwood, SPT  09/11/2019, 4:37 PM

## 2019-09-12 LAB — GLUCOSE, CAPILLARY
Glucose-Capillary: 72 mg/dL (ref 70–99)
Glucose-Capillary: 79 mg/dL (ref 70–99)
Glucose-Capillary: 95 mg/dL (ref 70–99)
Glucose-Capillary: 93 mg/dL (ref 70–99)
Glucose-Capillary: 109 mg/dL — ABNORMAL HIGH (ref 70–99)

## 2019-09-12 MED ORDER — CHLORHEXIDINE GLUCONATE CLOTH 2 % EX PADS: 6.0000 | MEDICATED_PAD | Freq: Every day | CUTANEOUS | Status: DC

## 2019-09-12 NOTE — Plan of Care (Signed)
  Problem: Consults Goal: RH GENERAL PATIENT EDUCATION Description: See Patient Education module for education specifics. Outcome: Progressing   Problem: RH SAFETY Goal: RH STG ADHERE TO SAFETY PRECAUTIONS W/ASSISTANCE/DEVICE Description: STG Adhere to Safety Precautions With  cues and reminders. Outcome: Progressing   Problem: RH KNOWLEDGE DEFICIT GENERAL Goal: RH STG INCREASE KNOWLEDGE OF SELF CARE AFTER HOSPITALIZATION Description: Patient will demonstrate and verbally acknowledge medication information, discharge instructions, follow up care using handouts independently.  Outcome: Progressing

## 2019-09-12 NOTE — Progress Notes (Signed)
[place] PHYSICAL MEDICINE & REHABILITATION PROGRESS NOTE   Subjective/Complaints:   CBG on low side but still in nl range, asymptomatic Does not check at home   ROS: denies CP, SOB, N/V/D   Objective:   Dg Chest 2 View  Result Date: 09/10/2019 CLINICAL DATA:  Leukocytosis. EXAM: CHEST - 2 VIEW COMPARISON:  September 07, 2019 FINDINGS: Enlarged cardiac silhouette. Calcific atherosclerotic disease of the aorta. Bilateral pleural effusions. Bilateral lower lobe, left greater than right, airspace consolidation versus atelectasis. Osseous structures are without acute abnormality. Soft tissues are grossly normal. IMPRESSION: 1. Bilateral pleural effusions. 2. Bilateral lower lobe, left greater than right, airspace consolidation versus atelectasis. 3. Calcific atherosclerotic disease of the aorta. Electronically Signed   By: Fidela Salisbury M.D.   On: 09/10/2019 20:55   Recent Labs    09/11/19 0532  WBC 10.4  HGB 8.9*  HCT 27.1*  PLT 239   Recent Labs    09/11/19 0532  NA 135  K 4.2  CL 93*  CO2 28  GLUCOSE 84  BUN 16  CREATININE 6.13*  CALCIUM 8.8*    Intake/Output Summary (Last 24 hours) at 09/12/2019 0713 Last data filed at 09/11/2019 1316 Gross per 24 hour  Intake 250 ml  Output -  Net 250 ml     Physical Exam: Vital Signs Blood pressure 119/60, pulse 81, temperature 98.5 F (36.9 C), temperature source Oral, resp. rate 18, weight 101.9 kg, SpO2 98 %.  Physical Exam  Nursing note, labs, and vitals reviewed. Constitutional: pt awake, alert, appropriate,Obese woman; appears younger than stated age  HENT:  Head: Normocephalic and atraumatic.  Cor RRR no murmur or rub          Eyes:EOMI, no injection Neck: No tracheal deviation present. No thyromegaly present.  Respiratory: CTA B/L- no W/R/R Right prior CT suture under right breast.    GI: abd protuberant, soft, NT, ND, (+)hypoactive BS  Musculoskeletal:        General: No edema.     Comments: Left wrist  with graft - palpable  Neurological: She is alert and oriented to person, place, and time.  Motor: Grossly 5/5 throughout  Skin: Skin is warm and dry. No rash noted. No erythema.  Vascular changes b/l LE  Psychiatric: full, affect      Assessment/Plan: 1. Functional deficits secondary to debility due to pericarditis/pericardial tamponade which require 3+ hours per day of interdisciplinary therapy in a comprehensive inpatient rehab setting.  Physiatrist is providing close team supervision and 24 hour management of active medical problems listed below.  Physiatrist and rehab team continue to assess barriers to discharge/monitor patient progress toward functional and medical goals  Care Tool:  Bathing    Body parts bathed by patient: Right arm, Left arm, Chest, Abdomen, Front perineal area, Buttocks, Right upper leg, Left upper leg, Right lower leg, Left lower leg, Face         Bathing assist Assist Level: Contact Guard/Touching assist     Upper Body Dressing/Undressing Upper body dressing   What is the patient wearing?: Hospital gown only    Upper body assist Assist Level: Minimal Assistance - Patient > 75%    Lower Body Dressing/Undressing Lower body dressing      What is the patient wearing?: Pants     Lower body assist Assist for lower body dressing: Minimal Assistance - Patient > 75%     Toileting Toileting    Toileting assist Assist for toileting: Minimal Assistance - Patient >  75%     Transfers Chair/bed transfer  Transfers assist     Chair/bed transfer assist level: Supervision/Verbal cueing Chair/bed transfer assistive device: Museum/gallery exhibitions officer assist      Assist level: Contact Guard/Touching assist Assistive device: Walker-rolling Max distance: 143ft   Walk 10 feet activity   Assist     Assist level: Contact Guard/Touching assist Assistive device: Walker-rolling   Walk 50 feet activity   Assist     Assist level: Contact Guard/Touching assist Assistive device: Walker-rolling    Walk 150 feet activity   Assist Walk 150 feet activity did not occur: Safety/medical concerns  Assist level: Contact Guard/Touching assist Assistive device: Walker-rolling    Walk 10 feet on uneven surface  activity   Assist     Assist level: Contact Guard/Touching assist(ramp) Assistive device: Aeronautical engineer Will patient use wheelchair at discharge?: No Type of Wheelchair: Manual    Wheelchair assist level: Supervision/Verbal cueing Max wheelchair distance: 75 feet    Wheelchair 50 feet with 2 turns activity    Assist        Assist Level: Supervision/Verbal cueing   Wheelchair 150 feet activity     Assist          Blood pressure 119/60, pulse 81, temperature 98.5 F (36.9 C), temperature source Oral, resp. rate 18, weight 101.9 kg, SpO2 98 %.  Medical Problem List and Plan: 1.  Debility secondary to pericarditis with pericardial tamponade.  CIR PT,  OT  2.  Antithrombotics: -DVT/anticoagulation:  Pharmaceutical: Other (comment)--Eliquis             -antiplatelet therapy: N/A 3. Left hip pain/Pain Management:  On gabapentin 100 mg/hs. Tylenol prn. Will add lidocaine patch for pain control.              Monitor with increased mobility 4. Mood: Very motivated to work hard and get home. LCSW to follow for evaluation and support.             -antipsychotic agents: N/A 5. Neuropsych: This patient is capable of making decisions on her own behalf. 6. Skin/Wound Care: Routine pressure relief measures.  7. Fluids/Electrolytes/Nutrition: Strict I/O. 1200 cc FR/day. Labs with HP. 8. Pericarditis: On Colchicine MWF X 3 months. Monitor for agranulocytosis.   9. T2DM: Hgb A 1c- 4.9. Blood sugars tightly controlled on SSI.    CBG (last 3)  Recent Labs    09/11/19 2144 09/12/19 0642 09/12/19 0702  GLUCAP 100* 72 79   Normoglycemic with  normal A1C d/c SSI  Does not check CBG at home              Monitor with increased mobility 10. PAF: Now in NSR on amiodarone 200 mg bid--->taper to 200 mg daily starting 10/11.               Last EKG 10/3 Afib and RVR, per exam is regular rate nd rythmn on Eliquis for CVA prophyllaxis no repeat EKG needed at this time  11. Chronic hypotension: Baseline SBP 80-90's range on midodrine tid. Reports can drop to 50's with HD.              Monitor for signs and symptoms Ace wraps/teds/abdominal binder if necessary. 12.  ESRD: HD MWF at end of the day to help with tolerance of therapy.  13. Anemia of chronic disease: Being monitored. On weekly Aranesp.  Labs with HD. 14. Leucocytosis: resolved ,  unable to void and  difficult cath d/c UA C and S . 15.  Morbidly obese: Encouraged weight loss     LOS: 3 days A FACE TO FACE EVALUATION WAS PERFORMED  Charlett Blake 09/12/2019, 7:13 AM

## 2019-09-12 NOTE — Progress Notes (Signed)
[facility] Progress Note   Subjective:   Patient seen and examined at bedside. No new complaints.  Feeling well. Denies SOB, CP, n/v/d, dizziness, weakness and fatigue.  States she has been eating more than normal during hospitalization and thinks she may have gained weight.    Objective Vitals:   09/11/19 0746 09/11/19 1318 09/11/19 1942 09/12/19 0332  BP: (!) 105/49 (!) 99/49 (!) 103/54 119/60  Pulse: 91 77 75 81  Resp: 15 18 17 18   Temp: 99.8 F (37.7 C) 97.7 F (36.5 C) 98.4 F (36.9 C) 98.5 F (36.9 C)  TempSrc: Oral Oral Oral Oral  SpO2: 100% 100% 100% 98%  Weight:    101.9 kg   Physical Exam General:NAD, Obese female, laying in bed Heart:RRR Lungs:CTAB Abdomen:soft, NTND Extremities:no LE edema Dialysis Access: LU AVF +b/t   Filed Weights   09/10/19 1805 09/11/19 0518 09/12/19 0332  Weight: 102.5 kg 101.4 kg 101.9 kg   No intake or output data in the 24 hours ending 09/12/19 1322  Additional Objective Labs: Basic Metabolic Panel: Recent Labs  Lab 09/06/19 0322 09/07/19 0404 09/08/19 0821 09/09/19 0226 09/11/19 0532  NA 138 138 136 134* 135  K 5.0 3.8 3.8 4.1 4.2  CL 102 101 100 95* 93*  CO2 25 26 27 24 28   GLUCOSE 90 85 121* 76 84  BUN 42* 22 36* 17 16  CREATININE 10.41* 7.19* 9.77* 5.79* 6.13*  CALCIUM 7.4* 8.3* 8.4* 8.4* 8.8*  PHOS 2.5 2.0*  --   --   --    Liver Function Tests: Recent Labs  Lab 09/06/19 0322 09/07/19 0404 09/11/19 0532  AST  --   --  26  ALT  --   --  16  ALKPHOS  --   --  108  BILITOT  --   --  0.8  PROT  --   --  6.1*  ALBUMIN 2.2* 2.2* 2.7*   CBC: Recent Labs  Lab 09/06/19 0321 09/07/19 0404 09/08/19 0821 09/09/19 0226 09/11/19 0532  WBC 15.4* 13.7* 14.9* 15.3* 10.4  NEUTROABS 11.4* 9.7*  --   --  7.4  HGB 9.8* 9.0* 9.3* 9.4* 8.9*  HCT 30.2* 29.2* 29.5* 29.4* 27.1*  MCV 101.0* 101.7* 101.7* 100.3* 98.2  PLT 355 298 342 282 239   CBG: Recent Labs  Lab 09/11/19 1647 09/11/19 2144  09/12/19 0642 09/12/19 0702 09/12/19 1142  GLUCAP 95 100* 72 79 95   Studies/Results: Dg Chest 2 View  Result Date: 09/10/2019 CLINICAL DATA:  Leukocytosis. EXAM: CHEST - 2 VIEW COMPARISON:  September 07, 2019 FINDINGS: Enlarged cardiac silhouette. Calcific atherosclerotic disease of the aorta. Bilateral pleural effusions. Bilateral lower lobe, left greater than right, airspace consolidation versus atelectasis. Osseous structures are without acute abnormality. Soft tissues are grossly normal. IMPRESSION: 1. Bilateral pleural effusions. 2. Bilateral lower lobe, left greater than right, airspace consolidation versus atelectasis. 3. Calcific atherosclerotic disease of the aorta. Electronically Signed   By: Fidela Salisbury M.D.   On: 09/10/2019 20:55    Medications:  . amiodarone  200 mg Oral Daily  . apixaban  5 mg Oral BID  . calcitRIOL  0.75 mcg Oral Q M,W,F-HD  . Chlorhexidine Gluconate Cloth  6 each Topical Q0600  . colchicine  0.3 mg Oral Q M,W,F-HD  . darbepoetin (ARANESP) injection - NON-DIALYSIS  60 mcg Subcutaneous Q Fri-1800  . docusate sodium  100 mg Oral BID  . gabapentin  100 mg Oral QHS  . lidocaine  1 patch Transdermal Q24H  . mouth rinse  15 mL Mouth Rinse BID  . midodrine  10 mg Oral TID WC  . multivitamin  1 tablet Oral QHS  . sucroferric oxyhydroxide  1,000 mg Oral TID WC & HS    Dialysis Orders: [facility] MWF  4h 200Re 450/800 99kg 2/2 bath Hep 10000 L AVF Parsabiv 15 Calcitriol 0.75 Recent OP Labs: Hgb 12.2 K 4.7 Ca 8.3 Phos 3.9 PTH 1960  Assessment/Plan: 1. Pericardial effusion/tamponade/acute pericarditis - EKG w/diffuse ST elevation. ECHO 9/29 showed LVEF 60-65%, normal RV fxn, enlargeing pericardial effusion and possible early tamponade.  CP improved but worsening hypotension, pt taken for pericardial window on 08/31/19. BP improved w/effusion drainage.  Per CTS/cards. Repeat echo 10/2 w/small effusion & no evidence tamponage.  Drain removed 10/3. On  colchicine MWF x3 months, needs Full CBC w/plts 2x month on HD to ensure no agranulocytosis.  2. Debility - in CIR 3. ESRD - on HD MWF. K 4.2. Next HD on 10/12.  Was on eliquis at home & Hep 10k w/HD, has not required prn bolus, will d/c.  With recent effusion will need to ensure meeting accurate EDW to prevent excess volume.  Also may need to re-evaluate OP Rx, pt initially had run time of 4.5hrs, may need to add back 61min to 4.25hrs.  3. Anemia of CKD- Hgb 8.9. Given aranesp 64mcg on 10/9, continue qwk.  tsat 20%, will start Fe bolus.  4. Secondary hyperparathyroidism - Ca at goal. Phos a little low, continue to follow may need to just take BID. Continue VDRA. Parsabiv not on [facility] formulary.  5. Hypotension/volume - chronic hypotension on midodrine. Over EDW per last weights, unsure if they are correct.  Will obtain standing weight pre HD and attempt to meet dry with HD tomorrow.  6. Nutrition - Alb 2.7. Renal diet w/fluid restrictions. Protein supplements.  7. L AVF aneurysms: eval by Dr. Donnetta Hutching, reports safe to use access & avoid superficial blistering 8. NSVT: per cards. Already on 3K bath 10 A fib - per primary 11. Dispo: CIR - from renal aspect OK to d/c at anytime  Jen Mow, PA-C [facility] Pager: [phone] 09/12/2019,1:22 PM  LOS: 3 days

## 2019-09-12 NOTE — Progress Notes (Signed)
Hypoglycemic Event  CBG: 72  Treatment: Ate breakfast  Symptoms: Asymptomatic  Follow-up CBG: Time: 0702 CBG Result: 79  Possible Reasons for Event: Refused night time snack   Comments/MD notified: Alysia Penna    Chilton Si

## 2019-09-13 ENCOUNTER — Ambulatory Visit: Payer: BC Managed Care – PPO | Admitting: Podiatry

## 2019-09-13 ENCOUNTER — Inpatient Hospital Stay (HOSPITAL_COMMUNITY): Payer: BC Managed Care – PPO

## 2019-09-13 LAB — CBC WITH DIFFERENTIAL/PLATELET
Abs Immature Granulocytes: 0.05 10*3/uL (ref 0.00–0.07)
Basophils Absolute: 0 10*3/uL (ref 0.0–0.1)
Basophils Relative: 0 %
Eosinophils Absolute: 0.1 10*3/uL (ref 0.0–0.5)
Eosinophils Relative: 1 %
HCT: 30 % — ABNORMAL LOW (ref 36.0–46.0)
Hemoglobin: 9.6 g/dL — ABNORMAL LOW (ref 12.0–15.0)
Immature Granulocytes: 1 %
Lymphocytes Relative: 17 %
Lymphs Abs: 1.9 10*3/uL (ref 0.7–4.0)
MCH: 32.3 pg (ref 26.0–34.0)
MCHC: 32 g/dL (ref 30.0–36.0)
MCV: 101 fL — ABNORMAL HIGH (ref 80.0–100.0)
Monocytes Absolute: 1.4 10*3/uL — ABNORMAL HIGH (ref 0.1–1.0)
Monocytes Relative: 13 %
Neutro Abs: 7.3 10*3/uL (ref 1.7–7.7)
Neutrophils Relative %: 68 %
Platelets: 299 10*3/uL (ref 150–400)
RBC: 2.97 MIL/uL — ABNORMAL LOW (ref 3.87–5.11)
RDW: 16.7 % — ABNORMAL HIGH (ref 11.5–15.5)
WBC: 10.8 10*3/uL — ABNORMAL HIGH (ref 4.0–10.5)
nRBC: 0 % (ref 0.0–0.2)

## 2019-09-13 LAB — GLUCOSE, CAPILLARY
Glucose-Capillary: 73 mg/dL (ref 70–99)
Glucose-Capillary: 82 mg/dL (ref 70–99)
Glucose-Capillary: 75 mg/dL (ref 70–99)
Glucose-Capillary: 118 mg/dL — ABNORMAL HIGH (ref 70–99)

## 2019-09-13 LAB — COMPREHENSIVE METABOLIC PANEL
ALT: 16 U/L (ref 0–44)
AST: 20 U/L (ref 15–41)
Albumin: 3 g/dL — ABNORMAL LOW (ref 3.5–5.0)
Alkaline Phosphatase: 122 U/L (ref 38–126)
Anion gap: 15 (ref 5–15)
BUN: 45 mg/dL — ABNORMAL HIGH (ref 8–23)
CO2: 27 mmol/L (ref 22–32)
Calcium: 9.2 mg/dL (ref 8.9–10.3)
Chloride: 93 mmol/L — ABNORMAL LOW (ref 98–111)
Creatinine, Ser: 11.04 mg/dL — ABNORMAL HIGH (ref 0.44–1.00)
GFR calc Af Amer: 4 mL/min — ABNORMAL LOW (ref >60)
GFR calc non Af Amer: 3 mL/min — ABNORMAL LOW (ref >60)
Glucose, Bld: 81 mg/dL (ref 70–99)
Potassium: 4.3 mmol/L (ref 3.5–5.1)
Sodium: 135 mmol/L (ref 135–145)
Total Bilirubin: 0.6 mg/dL (ref 0.3–1.2)
Total Protein: 6.4 g/dL — ABNORMAL LOW (ref 6.5–8.1)

## 2019-09-13 LAB — HEPATITIS B SURFACE ANTIGEN: Hepatitis B Surface Ag: NONREACTIVE

## 2019-09-13 MED ORDER — PENTAFLUOROPROP-TETRAFLUOROETH EX AERO: 1.0000 "application " | INHALATION_SPRAY | CUTANEOUS | Status: DC | PRN

## 2019-09-13 MED ORDER — DARBEPOETIN ALFA 60 MCG/0.3ML IJ SOSY
60.0000 ug | PREFILLED_SYRINGE | INTRAMUSCULAR | Status: DC
  Administered 2019-09-17: 18:00:00 60 ug via INTRAVENOUS
  Filled 2019-09-13: qty 0.3

## 2019-09-13 MED ORDER — LIDOCAINE-PRILOCAINE 2.5-2.5 % EX CREA: 1.0000 "application " | TOPICAL_CREAM | CUTANEOUS | Status: DC | PRN

## 2019-09-13 MED ORDER — HEPARIN SODIUM (PORCINE) 1000 UNIT/ML DIALYSIS: 1000.0000 [IU] | INTRAMUSCULAR | Status: DC | PRN

## 2019-09-13 MED ORDER — SODIUM CHLORIDE 0.9 % IV SOLN: 100.0000 mL | INTRAVENOUS | Status: DC | PRN

## 2019-09-13 MED ORDER — LIDOCAINE HCL (PF) 1 % IJ SOLN: 5.0000 mL | INTRAMUSCULAR | Status: DC | PRN

## 2019-09-13 MED ORDER — ALTEPLASE 2 MG IJ SOLR: 2.0000 mg | Freq: Once | INTRAMUSCULAR | Status: DC | PRN

## 2019-09-13 NOTE — Progress Notes (Signed)
Occupational Therapy Session Note  Patient Details  Name: Brenda Arnold MRN: [id number] Date of Birth: December 02, 1956  Today's Date: 09/13/2019 OT Individual Time: 1100-1140 OT Individual Time Calculation (min): 40 min  and Today's Date: 09/13/2019 OT Missed Time: 20 Minutes Missed Time Reason: Other (comment)(persistent heart flutters with "heartburn")   Short Term Goals: Week 1:  OT Short Term Goal 1 (Week 1): STG = LTGs due to ELOS  Skilled Therapeutic Interventions/Progress Updates:    Pt resting in bed upon arrival and eager for therapy. "I want to get out of here and go home." Pt reports that in earlier PT session she was experiencing persistent A-fib and required resting but was able to walk some. Pt states she is feeling better now.  Pt transitioned to ADL apartment and amb with RW to sofa where she reported persistent heart flutter with "heartburn" which would not let up.  Pt remained seated on sofa with no change in status.  Pt transported back to room and requested to use toilet.  Pt amb with RW to and from bathroom and performed all toileting tasks with supervision.  Pt returned to bed and remained in bed with all needs within reach and bed alarm activated.  RN aware of pt's status.   Therapy Documentation Precautions:  Precautions Precautions: Fall Restrictions Weight Bearing Restrictions: No General: General OT Amount of Missed Time: 20 Minutes Pain: Pain Assessment Pain Scale: 0-10 Pain Score: 0-No pain   Therapy/Group: Individual Therapy  [name] 09/13/2019, 11:54 AM

## 2019-09-13 NOTE — Progress Notes (Signed)
Occupational Therapy Session Note  Patient Details  Name: NAKEDA MIDDLEMISS MRN: [id number] Date of Birth: 06-Jan-1956  Today's Date: 09/13/2019 OT Individual Time: 0700-0810 OT Individual Time Calculation (min): 70 min    Short Term Goals: Week 1:  OT Short Term Goal 1 (Week 1): STG = LTGs due to ELOS  Skilled Therapeutic Interventions/Progress Updates:    Pt resting in bed upon arrival.  OT intervention with focus on functional amb with RW, standing balance, BADL training, activity tolerance, safety awareness, and discharge planning to increase independence with BADLs.  Pt uses RW at home and in community setting.  Pt completed all bathing/dressing tasks with sit<>stand. Pt completed shower in walk-in shower in room.  Pt used chair to sit in for grooming tasks.  Pt states she uses chair at home for grooming as well.  Pt requires multiple rest breaks and reported 2-3 short duration A-fib episodes. Discussed LTGs and possible ELOS. Pt completed all tasks at supervision level.  Pt remained seated EOB with all needs within reach and bed alarm activated.   Therapy Documentation Precautions:  Precautions Precautions: Fall Restrictions Weight Bearing Restrictions: No  Pain:  Pt denies pain this morning   Therapy/Group: Individual Therapy  [name] 09/13/2019, 8:13 AM

## 2019-09-13 NOTE — Progress Notes (Signed)
Physical Therapy Session Note  Patient Details  Name: NICKOLA WILLAUER MRN: [id number] Date of Birth: 1956-06-15  Today's Date: 09/13/2019 PT Individual Time: 0900-0955 PT Individual Time Calculation (min): 55 min   Short Term Goals: Week 1:  PT Short Term Goal 1 (Week 1): = to LTGs based on ELOS  Skilled Therapeutic Interventions/Progress Updates:     Patient in bed upon PT arrival. Patient alert and agreeable to PT session. Reported feeling fluttering in her chest this morning, she suspected A-fib, RN made aware. Vitals: BP 88/43, HR 86, SPO2 100% on RA. Educated patient on signs and symptoms of A-fib and hypotension. Discussed fluid intake, patient reports limiting fluid intake to well below 1200 ccs a day. Educated on benefits of fluid intake and correlation to low BP. Discussed daily eating drinking habbits during daily routine and educated on increasing fluid intake and taking in several small meals to maintain blood glucose levels throughout the day. Provided fall risk/prevention education at home and activation of emergency services. Patient very receptive to all education.    Therapeutic Activity: Bed Mobility: Patient performed supine to/from sit independently in a flat bed without bed rails.  Transfers: Patient performed sit to/from stand x1 with close supervision for safety. Provided verbal cues for hand placement on RW.  Gait Training:  Patient ambulated 114 feet x2 with a 1.5 min standing rest break in between using RW with close supervision. Ambulated with decreased gait speed, decreased step length, increased hip and knee flexion, forward trunk lean, and downward head gaze. Provided verbal cues for erect posture, looking ahead, and increased step height to reduce fall risk. Patient performed pursed lip breathing in the recovery position in standing due to mild SOB after both trials. Vitals after ambulation: BP 149/65, HR 93, SPO2 100% on RA. RPE 4-5/10 on modified scale after  both trials. Educated on use of RPE to manage fatigue with daily activities and educated on energy conservation techniques throughout session.   Patient in bed with RN present at end of session with breaks locked, bed alarm set, and all needs within reach.    Therapy Documentation Precautions:  Precautions Precautions: Fall Restrictions Weight Bearing Restrictions: No Pain: Pain Assessment Pain Scale: 0-10 Pain Score: 0-No pain    Therapy/Group: Individual Therapy  Bernd Crom L Leeanne Butters PT, DPT  09/13/2019, 12:28 PM

## 2019-09-13 NOTE — Progress Notes (Signed)
PHYSICAL MEDICINE & REHABILITATION PROGRESS NOTE   Subjective/Complaints:   LBM this am- pt reports she feels "great"- just took shower and walked with therapy.  Denies CP, SOB, does admit to having rare intermittent palpitations   ROS: denies CP, SOB, N/V/D   Objective:   No results found. Recent Labs    09/11/19 0532 09/13/19 1002  WBC 10.4 10.8*  HGB 8.9* 9.6*  HCT 27.1* 30.0*  PLT 239 299   Recent Labs    09/11/19 0532  NA 135  K 4.2  CL 93*  CO2 28  GLUCOSE 84  BUN 16  CREATININE 6.13*  CALCIUM 8.8*    Intake/Output Summary (Last 24 hours) at 09/13/2019 1049 Last data filed at 09/13/2019 0955 Gross per 24 hour  Intake 840 ml  Output -  Net 840 ml     Physical Exam: Vital Signs Blood pressure (!) 108/57, pulse 74, temperature 99.3 F (37.4 C), temperature source Oral, resp. rate 18, weight 102.3 kg, SpO2 99 %.  Physical Exam  Nursing note, labs, and vitals reviewed. Constitutional: pt awake, alert, appropriate,obese sitting on EOB in room, t- 99.3, NAD HENT:  Head: Normocephalic and atraumatic.  CV: RRR no extra heart sounds heard or irregularity- pt then mentioned had 1 palpitation after I listened-  Listened again- still RRR  Eyes:EOMI, no injected Neck: No tracheal deviation present. No thyromegaly present.  Respiratory: CTA B/L- no W/R/R Right prior CT suture under right breast.    GI: abd protuberant, soft, NT, ND, (+)hypoactive BS  Musculoskeletal:        General: No edema.     Comments: Left wrist with graft - palpable  Neurological: She is alert and oriented to person, place, and time.  Motor: Grossly 5/5 throughout  Skin: Skin is warm and dry. No rash noted. No erythema.  Vascular changes b/l LE  Psychiatric: full, affect      Assessment/Plan: 1. Functional deficits secondary to debility due to pericarditis/pericardial tamponade which require 3+ hours per day of interdisciplinary therapy in a comprehensive inpatient  rehab setting.  Physiatrist is providing close team supervision and 24 hour management of active medical problems listed below.  Physiatrist and rehab team continue to assess barriers to discharge/monitor patient progress toward functional and medical goals  Care Tool:  Bathing    Body parts bathed by patient: Right arm, Left arm, Chest, Abdomen, Front perineal area, Buttocks, Right upper leg, Left upper leg, Right lower leg, Left lower leg, Face         Bathing assist Assist Level: Supervision/Verbal cueing     Upper Body Dressing/Undressing Upper body dressing   What is the patient wearing?: Pull over shirt    Upper body assist Assist Level: Set up assist    Lower Body Dressing/Undressing Lower body dressing      What is the patient wearing?: Pants     Lower body assist Assist for lower body dressing: Supervision/Verbal cueing     Toileting Toileting    Toileting assist Assist for toileting: Supervision/Verbal cueing     Transfers Chair/bed transfer  Transfers assist     Chair/bed transfer assist level: Minimal Assistance - Patient > 75% Chair/bed transfer assistive device: Programmer, multimedia   Ambulation assist      Assist level: Contact Guard/Touching assist Assistive device: Walker-rolling Max distance: 162ft   Walk 10 feet activity   Assist     Assist level: Contact Guard/Touching assist Assistive device: Walker-rolling   Walk  50 feet activity   Assist    Assist level: Contact Guard/Touching assist Assistive device: Walker-rolling    Walk 150 feet activity   Assist Walk 150 feet activity did not occur: Safety/medical concerns  Assist level: Contact Guard/Touching assist Assistive device: Walker-rolling    Walk 10 feet on uneven surface  activity   Assist     Assist level: Contact Guard/Touching assist(ramp) Assistive device: Aeronautical engineer Will patient use wheelchair at  discharge?: No Type of Wheelchair: Manual    Wheelchair assist level: Supervision/Verbal cueing Max wheelchair distance: 75 feet    Wheelchair 50 feet with 2 turns activity    Assist        Assist Level: Supervision/Verbal cueing   Wheelchair 150 feet activity     Assist          Blood pressure (!) 108/57, pulse 74, temperature 99.3 F (37.4 C), temperature source Oral, resp. rate 18, weight 102.3 kg, SpO2 99 %.  Medical Problem List and Plan: 1.  Debility secondary to pericarditis with pericardial tamponade.  CIR PT,  OT  2.  Antithrombotics: -DVT/anticoagulation:  Pharmaceutical: Other (comment)--Eliquis             -antiplatelet therapy: N/A 3. Left hip pain/Pain Management:  On gabapentin 100 mg/hs. Tylenol prn. Will add lidocaine patch for pain control.              Monitor with increased mobility 4. Mood: Very motivated to work hard and get home. LCSW to follow for evaluation and support.             -antipsychotic agents: N/A 5. Neuropsych: This patient is capable of making decisions on her own behalf. 6. Skin/Wound Care: Routine pressure relief measures.  7. Fluids/Electrolytes/Nutrition: Strict I/O. 1200 cc FR/day. Labs with HP. 8. Pericarditis: On Colchicine MWF X 3 months. Monitor for agranulocytosis.   9. T2DM: Hgb A 1c- 4.9. Blood sugars tightly controlled on SSI.    CBG (last 3)  Recent Labs    09/12/19 1657 09/12/19 2057 09/13/19 0637  GLUCAP 93 109* 73   Normoglycemic with normal A1C d/c SSI  Does not check CBG at home              Monitor with increased mobility 10. PAF: Now in NSR on amiodarone 200 mg bid--->taper to 200 mg daily starting 10/11.               Last EKG 10/3 Afib and RVR, per exam is regular rate nd rythmn on Eliquis for CVA prophyllaxis no repeat EKG needed at this time  11. Chronic hypotension: Baseline SBP 80-90's range on midodrine tid. Reports can drop to 50's with HD.              Monitor for signs and symptoms Ace  wraps/teds/abdominal binder if necessary. 12.  ESRD: HD MWF at end of the day to help with tolerance of therapy.  13. Anemia of chronic disease: Being monitored. On weekly Aranesp.  Labs with HD. 14. Leucocytosis: resolved , unable to void and  difficult cath d/c UA C and S .  10/12- T 99.3 and WBC 10.8- will monitor CLOSELY 15.  Morbidly obese: Encouraged weight loss     LOS: 4 days A FACE TO FACE EVALUATION WAS PERFORMED  Katianne Barre 09/13/2019, 10:49 AM

## 2019-09-13 NOTE — Progress Notes (Signed)
[facility] ROUNDING NOTE   Subjective:   63 year old lady with history of diabetes end-stage renal disease Monday Wednesday Friday dialysis osteoarthritis both hips status post right total hip replacement and in need of left total hip replacement admitted 08/25/2019 with chest pain abnormal EKG found to be secondary to pericarditis.  CT scan of chest was negative for aneurysm pulmonary embolus.  Echocardiogram showed ejection fraction 65% with no pericardial effusion.  She is treated with colchicine.  She developed atrial fibrillation rapid heart rate started on amiodarone 08/07/2019.  She had worsening hypertension on 08/31/2019 was found to have a pericardial effusion with concerns of tamponade.  She was taken to the OR had a VATS procedure with right pericardial window by CT surgery.  Follow-up echo 09/03/2019 showed small pericardial effusion without evidence for tamponade with a left lateral pleural effusion status post pericardial tube which was discontinued on 09/04/2019.   Blood pressure 108/57 pulse 94 temperature 9.3 O2 sats 9 9% room air  Sodium 135 potassium 4.2 chloride 93 CO2 28 BUN 16 creatinine 6 glucose 84 calcium 8.8 albumin 2.7 AST 26 ALT 16 WBC 10.4 hemoglobin 8.9 platelets 239  Amiodarone 200 mg daily apixaban 5 mg twice daily calcitriol 0.75 mcg Monday Wednesday Friday, colchicine 0.3 mg Monday Wednesday Friday darbepoetin 60 mcg last administered 09/10/2019, Neurontin 100 mg nightly, ProAmatine 10 mg 3 times daily, multivitamins 1 daily   Objective:  Vital signs in last 24 hours:  Temp:  [98.2 F (36.8 C)-99.3 F (37.4 C)] 99.3 F (37.4 C) (10/12 0401) Pulse Rate:  [74-82] 74 (10/12 0401) Resp:  [16-18] 18 (10/12 0401) BP: (99-108)/(50-57) 108/57 (10/12 0401) SpO2:  [99 %-100 %] 99 % (10/12 0401) Weight:  [102.3 kg] 102.3 kg (10/12 0401)  Weight change: 0.431 kg Filed Weights   09/11/19 0518 09/12/19 0332 09/13/19 0401  Weight: 101.4 kg 101.9 kg 102.3 kg     Intake/Output: I/O last 3 completed shifts: In: 600 [P.O.:600] Out: -    Intake/Output this shift:  Total I/O In: 240 [P.O.:240] Out: -   General:NAD, Obese female, laying in bed Heart:RRR Lungs:CTAB Abdomen:soft, NTND Extremities:no LE edema Dialysis Access: LU AVF +b/t   Basic Metabolic Panel: Recent Labs  Lab 09/07/19 0404 09/08/19 0821 09/09/19 0226 09/11/19 0532  NA 138 136 134* 135  K 3.8 3.8 4.1 4.2  CL 101 100 95* 93*  CO2 26 27 24 28   GLUCOSE 85 121* 76 84  BUN 22 36* 17 16  CREATININE 7.19* 9.77* 5.79* 6.13*  CALCIUM 8.3* 8.4* 8.4* 8.8*  MG 2.0  --   --   --   PHOS 2.0*  --   --   --     Liver Function Tests: Recent Labs  Lab 09/07/19 0404 09/11/19 0532  AST  --  26  ALT  --  16  ALKPHOS  --  108  BILITOT  --  0.8  PROT  --  6.1*  ALBUMIN 2.2* 2.7*   No results for input(s): LIPASE, AMYLASE in the last 168 hours. No results for input(s): AMMONIA in the last 168 hours.  CBC: Recent Labs  Lab 09/07/19 0404 09/08/19 0821 09/09/19 0226 09/11/19 0532  WBC 13.7* 14.9* 15.3* 10.4  NEUTROABS 9.7*  --   --  7.4  HGB 9.0* 9.3* 9.4* 8.9*  HCT 29.2* 29.5* 29.4* 27.1*  MCV 101.7* 101.7* 100.3* 98.2  PLT 298 342 282 239    Cardiac Enzymes: No results for input(s): CKTOTAL, CKMB, CKMBINDEX, TROPONINI  in the last 168 hours.  BNP: Invalid input(s): POCBNP  CBG: Recent Labs  Lab 09/12/19 0702 09/12/19 1142 09/12/19 1657 09/12/19 2057 09/13/19 0637  GLUCAP 79 95 93 109* 73    Microbiology: Results for orders placed or performed during the hospital encounter of 08/25/19  SARS CORONAVIRUS 2 (TAT 6-24 HRS) Nasopharyngeal Nasopharyngeal Swab     Status: None   Collection Time: 08/25/19 12:22 PM   Specimen: Nasopharyngeal Swab  Result Value Ref Range Status   SARS Coronavirus 2 NEGATIVE NEGATIVE Final    Comment: (NOTE) SARS-CoV-2 target nucleic acids are NOT DETECTED. The SARS-CoV-2 RNA is generally detectable in upper and  lower respiratory specimens during the acute phase of infection. Negative results do not preclude SARS-CoV-2 infection, do not rule out co-infections with other pathogens, and should not be used as the sole basis for treatment or other patient management decisions. Negative results must be combined with clinical observations, patient history, and epidemiological information. The expected result is Negative. Fact Sheet for Patients: SugarRoll.be Fact Sheet for Healthcare Providers: https://www.woods-mathews.com/ This test is not yet approved or cleared by the [place] FDA and  has been authorized for detection and/or diagnosis of SARS-CoV-2 by FDA under an Emergency Use Authorization (EUA). This EUA will remain  in effect (meaning this test can be used) for the duration of the COVID-19 declaration under Section 56 4(b)(1) of the Act, 21 U.S.C. section 360bbb-3(b)(1), unless the authorization is terminated or revoked sooner. Performed at [facility], [place] [address], [place], [place] [address]   Surgical PCR screen     Status: None   Collection Time: 08/31/19 11:26 AM   Specimen: Nasal Swab  Result Value Ref Range Status   MRSA, PCR NEGATIVE NEGATIVE Final   Staphylococcus aureus NEGATIVE NEGATIVE Final    Comment: (NOTE) The Xpert SA Assay (FDA approved for NASAL specimens in patients 45 years of age and older), is one component of a comprehensive surveillance program. It is not intended to diagnose infection nor to guide or monitor treatment. Performed at [facility], [place] [address], [place], [place] [address]   Aerobic/Anaerobic Culture (surgical/deep wound)     Status: None   Collection Time: 08/31/19  2:17 PM   Specimen: PATH Cytology Misc. fluid; Body Fluid  Result Value Ref Range Status   Specimen Description PERICARDIAL  Final   Special Requests NONE  Final   Gram Stain   Final    MODERATE WBC PRESENT,BOTH PMN  AND MONONUCLEAR NO ORGANISMS SEEN    Culture   Final    No growth aerobically or anaerobically. Performed at [facility], [place] [address] [place], [place] [address]    Report Status 09/05/2019 FINAL  Final  Aerobic/Anaerobic Culture (surgical/deep wound)     Status: None   Collection Time: 08/31/19  2:29 PM   Specimen: Pericardial; Tissue  Result Value Ref Range Status   Specimen Description PERICARDIAL  Final   Special Requests NONE  Final   Gram Stain   Final    FEW WBC PRESENT, PREDOMINANTLY MONONUCLEAR NO ORGANISMS SEEN    Culture   Final    No growth aerobically or anaerobically. Performed at [facility], [place] [address] [place], [place] [address]    Report Status 09/05/2019 FINAL  Final    Coagulation Studies: No results for input(s): LABPROT, INR in the last 72 hours.  Urinalysis: No results for input(s): COLORURINE, LABSPEC, [place], [place], [facility], [place], [place], [place],  UROBILINOGEN, NITRITE, LEUKOCYTESUR in the last 72 hours.  Invalid input(s): APPERANCEUR    Imaging: No results found.   Medications:    . amiodarone  200 mg Oral Daily  . apixaban  5 mg Oral BID  . calcitRIOL  0.75 mcg Oral Q M,W,F-HD  . Chlorhexidine Gluconate Cloth  6 each Topical Q0600  . colchicine  0.3 mg Oral Q M,W,F-HD  . darbepoetin (ARANESP) injection - NON-DIALYSIS  60 mcg Subcutaneous Q Fri-1800  . docusate sodium  100 mg Oral BID  . gabapentin  100 mg Oral QHS  . lidocaine  1 patch Transdermal Q24H  . mouth rinse  15 mL Mouth Rinse BID  . midodrine  10 mg Oral TID WC  . multivitamin  1 tablet Oral QHS  . sucroferric oxyhydroxide  1,000 mg Oral TID WC & HS   acetaminophen, bisacodyl, diphenhydrAMINE, guaiFENesin-dextromethorphan, menthol-cetylpyridinium, Muscle Rub, polyethylene glycol, prochlorperazine **OR** prochlorperazine **OR** prochlorperazine, senna-docusate, traMADol, traZODone  Assessment/ Plan:   ESRD-Monday Wednesday Friday dialysis  next dialysis treatment will be scheduled for 09/13/2019.    Debility continues on rehab  Pericardial effusion/tamponade EF 6065% was treated with pericardial window 08/31/2019 due to hypotension and symptomatic pericardial effusion appreciate assistance from cardiothoracic surgery.  Drain has been removed on 09/04/2019.  She continues on colchicine 0.36 mg Monday Wednesday Friday for period of 3 months.  Anemia stable 60 mcg last administered 09/10/2019 T sats 20%  Secondary hyperparathyroidism calcium appears to be at goal possibly a little high corrected for low albumin.  We will continue to follow.  Patient was receiving Parsabiv as an outpatient this is not an hospital formulary at this present time  Chronic hypertension continue midodrine 10 mg 3 times a day.  Left AV fistula aneurysms follow with Dr. Donnetta Hutching safe to use with no pending bleeding  NSVT followed by cards  Atrial fibrillation followed by primary team anticoagulated rate controlled   LOS: [place] @TODAY @9 :47 AM

## 2019-09-14 ENCOUNTER — Inpatient Hospital Stay (HOSPITAL_COMMUNITY): Payer: BC Managed Care – PPO

## 2019-09-14 LAB — CBC WITH DIFFERENTIAL/PLATELET
Abs Immature Granulocytes: 0.04 10*3/uL (ref 0.00–0.07)
Basophils Absolute: 0 10*3/uL (ref 0.0–0.1)
Basophils Relative: 0 %
Eosinophils Absolute: 0.1 10*3/uL (ref 0.0–0.5)
Eosinophils Relative: 2 %
HCT: 29.1 % — ABNORMAL LOW (ref 36.0–46.0)
Hemoglobin: 9 g/dL — ABNORMAL LOW (ref 12.0–15.0)
Immature Granulocytes: 1 %
Lymphocytes Relative: 18 %
Lymphs Abs: 1.6 10*3/uL (ref 0.7–4.0)
MCH: 31.4 pg (ref 26.0–34.0)
MCHC: 30.9 g/dL (ref 30.0–36.0)
MCV: 101.4 fL — ABNORMAL HIGH (ref 80.0–100.0)
Monocytes Absolute: 1.2 10*3/uL — ABNORMAL HIGH (ref 0.1–1.0)
Monocytes Relative: 14 %
Neutro Abs: 5.7 10*3/uL (ref 1.7–7.7)
Neutrophils Relative %: 65 %
Platelets: 269 10*3/uL (ref 150–400)
RBC: 2.87 MIL/uL — ABNORMAL LOW (ref 3.87–5.11)
RDW: 17.1 % — ABNORMAL HIGH (ref 11.5–15.5)
WBC: 8.7 10*3/uL (ref 4.0–10.5)
nRBC: 0 % (ref 0.0–0.2)

## 2019-09-14 LAB — RENAL FUNCTION PANEL
Albumin: 2.8 g/dL — ABNORMAL LOW (ref 3.5–5.0)
Anion gap: 13 (ref 5–15)
BUN: 24 mg/dL — ABNORMAL HIGH (ref 8–23)
CO2: 27 mmol/L (ref 22–32)
Calcium: 8.7 mg/dL — ABNORMAL LOW (ref 8.9–10.3)
Chloride: 96 mmol/L — ABNORMAL LOW (ref 98–111)
Creatinine, Ser: 7.04 mg/dL — ABNORMAL HIGH (ref 0.44–1.00)
GFR calc Af Amer: 7 mL/min — ABNORMAL LOW (ref >60)
GFR calc non Af Amer: 6 mL/min — ABNORMAL LOW (ref >60)
Glucose, Bld: 83 mg/dL (ref 70–99)
Phosphorus: 1.8 mg/dL — ABNORMAL LOW (ref 2.5–4.6)
Potassium: 4.1 mmol/L (ref 3.5–5.1)
Sodium: 136 mmol/L (ref 135–145)

## 2019-09-14 LAB — GLUCOSE, CAPILLARY
Glucose-Capillary: 84 mg/dL (ref 70–99)
Glucose-Capillary: 73 mg/dL (ref 70–99)
Glucose-Capillary: 76 mg/dL (ref 70–99)
Glucose-Capillary: 106 mg/dL — ABNORMAL HIGH (ref 70–99)

## 2019-09-14 MED ORDER — CHLORHEXIDINE GLUCONATE CLOTH 2 % EX PADS
6.0000 | MEDICATED_PAD | Freq: Every day | CUTANEOUS | Status: DC
  Administered 2019-09-14: 6 via TOPICAL

## 2019-09-14 NOTE — Progress Notes (Addendum)
Physical Therapy Session Note  Patient Details  Name: Brenda Arnold MRN: [id number] Date of Birth: [birth year]  Today's Date: 09/14/2019 PT Individual Time: 1345-1505 PT Individual Time Calculation (min): 80 min   Short Term Goals: Week 1:  PT Short Term Goal 1 (Week 1): = to LTGs based on ELOS  Skilled Therapeutic Interventions/Progress Updates:     Patient in w/c upon PT arrival. Patient alert and agreeable to PT session. Patient denied pain throughout session.   Therapeutic Activity: Transfers: Patient performed a stand pivot with a RW with supervision x1 and sit to/from stand x11 with supervision with and without RW and with and without use of UEs. Provided verbal cues for use of UEs for safety and without use of UEs for strengthing with a functional activity.  Gait Training:  Patient ambulated >150 feet from her room to the gym, taking a standing rest break about half way on the other side of the nursing station, RPE 6/10 at rest break and 5/10 in gym. She also ambulated ~100 feet x2 from the gym to the day room taking a seated rest break at the chairs by the elevators, RPE 6/10 after each. She was transported in the w/c with total A back to the nurses station by her room, for energy conservation then ambulated the reamaining 80 feet back to her room, RPE 7/10 after. Ambulated with a RW with increased trunk and hip flexion, decreased gait speed and step length R>L, and mild antalgic gait on L, patient reports OA in L hip pending hip replacement when she is medically stable. Provided verbal cues for erect posture, looking ahead, and increased step height for decreased fall risk.  Patient went up/down 4 steps using the RW with step to gait pattern with S, 1 trial going forward up and down and 1 trial going backwards up and forwards down. Patient safe with both techniques and unsure which she prefers at this time. Patient stated that she only used the 4 steps in front when the back is  flooded or it has snowed.  She performed going up/down a single 8" step using the RW going forward x1 with supervision with cues for staying close to the RW.   Neuromuscular Re-ed: Patient performed the [facility] Scale, see details below, scoring 49/56. Patient demonstrates increased fall risk as noted by score of  49/56 on Berg Balance Scale.  (<36= high risk for falls, close to 100%; 37-45 significant >80%; 46-51 moderate >50%; 52-55 lower >25%). PT educated on results of test and meaning of results during session.   Therapeutic Exercise: Patient performed the following exercises with verbal and tactile cues for proper technique. -NuStep x8 min, 54-73 SPM, work load level 5. Vitals at start: HR 86 SPO2 98%, RPE 1/10. Vitals at 4 min: HR 101 SPO2 98% RPE 5/10. Vitals at 8 min: HR 111, SPO2 93%, RPE 8/10. Encouraged increased speed at 4 min, patient able to sustain 65-73 SPM during last 4 min.   Patient in chair in room at end of session with breaks locked and all needs within reach. Left chair alarm off at end of session. Patient in agreement with not getting up on her own. Discussed with Sharyn Lull, RN and she was in agreement with d/c chair alarm.    Therapy Documentation Precautions:  Precautions Precautions: Fall Restrictions Weight Bearing Restrictions: No  Balance: Standardized Balance Assessment Standardized Balance Assessment: Berg Balance Test Berg Balance Test Sit to Stand: Able to stand without using hands and  stabilize independently Standing Unsupported: Able to stand safely 2 minutes Sitting with Back Unsupported but Feet Supported on Floor or Stool: Able to sit safely and securely 2 minutes Stand to Sit: Sits safely with minimal use of hands Transfers: Able to transfer safely, minor use of hands Standing Unsupported with Eyes Closed: Able to stand 10 seconds safely Standing Ubsupported with Feet Together: Able to place feet together independently and stand 1 minute  safely From Standing, Reach Forward with Outstretched Arm: Can reach forward >12 cm safely (5") From Standing Position, Pick up Object from Floor: Able to pick up shoe safely and easily From Standing Position, Turn to Look Behind Over each Shoulder: Looks behind from both sides and weight shifts well Turn 360 Degrees: Able to turn 360 degrees safely but slowly Standing Unsupported, Alternately Place Feet on Step/Stool: Able to stand independently and safely and complete 8 steps in 20 seconds Standing Unsupported, One Foot in Front: Able to plae foot ahead of the other independently and hold 30 seconds Standing on One Leg: Tries to lift leg/unable to hold 3 seconds but remains standing independently Total Score: 49  Therapy/Group: Individual Therapy  Bay Wayson L Amayia Ciano PT, DPT  09/14/2019, 4:29 PM

## 2019-09-14 NOTE — Progress Notes (Signed)
[name] PHYSICAL MEDICINE & REHABILITATION PROGRESS NOTE   Subjective/Complaints:   LBM yesterday- pt reports she walked 260 ft with RW today- didn't get "1/2 as winded" as before- had a "weird heartburn" yesterday- is resolved.   ROS: denies CP, SOB, N/V/D   Objective:   No results found. Recent Labs    09/13/19 1002 09/14/19 0634  WBC 10.8* 8.7  HGB 9.6* 9.0*  HCT 30.0* 29.1*  PLT 299 269   Recent Labs    09/13/19 1002 09/14/19 0634  NA 135 136  K 4.3 4.1  CL 93* 96*  CO2 27 27  GLUCOSE 81 83  BUN 45* 24*  CREATININE 11.04* 7.04*  CALCIUM 9.2 8.7*    Intake/Output Summary (Last 24 hours) at 09/14/2019 1311 Last data filed at 09/14/2019 0825 Gross per 24 hour  Intake 120 ml  Output 2855 ml  Net -2735 ml     Physical Exam: Vital Signs Blood pressure (!) 102/53, pulse 71, temperature 98.6 F (37 C), temperature source Oral, resp. rate 17, weight 103.2 kg, SpO2 99 %.  Physical Exam  Nursing note, labs, and vitals reviewed. Constitutional: pt awake, alert, appropriate,obese sitting on EOB in room, eating breakfast, NAD HENT:  Head: Normocephalic and atraumatic.  CV: RRR   Eyes:EOMI, no injected Neck: No tracheal deviation present. No thyromegaly present.  Respiratory: CTA B/L- no W/R/R Right prior CT suture under right breast.    GI: abd protuberant, soft, NT, ND, (+)hypoactive BS  Musculoskeletal:        General: No edema.     Comments: Left wrist with graft - palpable  Neurological: She is alert and oriented to person, place, and time.  Motor: Grossly 5/5 throughout  Skin: Skin is warm and dry. No rash noted. No erythema.  Vascular changes b/l LE  Psychiatric: full, affect      Assessment/Plan: 1. Functional deficits secondary to debility due to pericarditis/pericardial tamponade which require 3+ hours per day of interdisciplinary therapy in a comprehensive inpatient rehab setting.  Physiatrist is providing close team supervision and 24  hour management of active medical problems listed below.  Physiatrist and rehab team continue to assess barriers to discharge/monitor patient progress toward functional and medical goals  Care Tool:  Bathing    Body parts bathed by patient: Right arm, Left arm, Chest, Abdomen, Front perineal area, Buttocks, Right upper leg, Left upper leg, Right lower leg, Left lower leg, Face         Bathing assist Assist Level: Supervision/Verbal cueing     Upper Body Dressing/Undressing Upper body dressing   What is the patient wearing?: Pull over shirt    Upper body assist Assist Level: Independent    Lower Body Dressing/Undressing Lower body dressing      What is the patient wearing?: Pants     Lower body assist Assist for lower body dressing: Supervision/Verbal cueing     Toileting Toileting    Toileting assist Assist for toileting: Supervision/Verbal cueing     Transfers Chair/bed transfer  Transfers assist     Chair/bed transfer assist level: Minimal Assistance - Patient > 75% Chair/bed transfer assistive device: Programmer, multimedia   Ambulation assist      Assist level: Supervision/Verbal cueing Assistive device: Walker-rolling Max distance: 114'   Walk 10 feet activity   Assist     Assist level: Supervision/Verbal cueing Assistive device: Walker-rolling   Walk 50 feet activity   Assist    Assist level: Supervision/Verbal cueing Assistive  device: Walker-rolling    Walk 150 feet activity   Assist Walk 150 feet activity did not occur: Safety/medical concerns  Assist level: Contact Guard/Touching assist Assistive device: Walker-rolling    Walk 10 feet on uneven surface  activity   Assist     Assist level: Contact Guard/Touching assist(ramp) Assistive device: Aeronautical engineer Will patient use wheelchair at discharge?: No Type of Wheelchair: Manual    Wheelchair assist level:  Supervision/Verbal cueing Max wheelchair distance: 75 feet    Wheelchair 50 feet with 2 turns activity    Assist        Assist Level: Supervision/Verbal cueing   Wheelchair 150 feet activity     Assist          Blood pressure (!) 102/53, pulse 71, temperature 98.6 F (37 C), temperature source Oral, resp. rate 17, weight 103.2 kg, SpO2 99 %.  Medical Problem List and Plan: 1.  Debility secondary to pericarditis with pericardial tamponade.  CIR PT,  OT   10/13- walked 260 ft with 1 rest break with RW 2.  Antithrombotics: -DVT/anticoagulation:  Pharmaceutical: Other (comment)--Eliquis             -antiplatelet therapy: N/A 3. Left hip pain/Pain Management:  On gabapentin 100 mg/hs. Tylenol prn. Will add lidocaine patch for pain control.              Monitor with increased mobility 4. Mood: Very motivated to work hard and get home. LCSW to follow for evaluation and support.             -antipsychotic agents: N/A 5. Neuropsych: This patient is capable of making decisions on her own behalf. 6. Skin/Wound Care: Routine pressure relief measures.  7. Fluids/Electrolytes/Nutrition: Strict I/O. 1200 cc FR/day. Labs with HP. 8. Pericarditis: On Colchicine MWF X 3 months. Monitor for agranulocytosis.   9. T2DM: Hgb A 1c- 4.9. Blood sugars tightly controlled on SSI.    CBG (last 3)  Recent Labs    09/13/19 2116 09/14/19 0611 09/14/19 1205  GLUCAP 118* 84 73   Normoglycemic with normal A1C d/c SSI  Does not check CBG at home              Monitor with increased mobility 10. PAF: Now in NSR on amiodarone 200 mg bid--->taper to 200 mg daily starting 10/11.               Last EKG 10/3 Afib and RVR, per exam is regular rate nd rythmn on Eliquis for CVA prophyllaxis no repeat EKG needed at this time  11. Chronic hypotension: Baseline SBP 80-90's range on midodrine tid. Reports can drop to 50's with HD.              Monitor for signs and symptoms Ace wraps/teds/abdominal binder  if necessary. 12.  ESRD: HD MWF at end of the day to help with tolerance of therapy.  13. Anemia of chronic disease: Being monitored. On weekly Aranesp.  Labs with HD. 14. Leucocytosis: resolved , unable to void and  difficult cath d/c UA C and S .  10/12- T 99.3 and WBC 10.8- will monitor CLOSELY  100/13- WBC resolved and no more low grade temps 15.  Morbidly obese: Encouraged weight loss     LOS: 5 days A FACE TO FACE EVALUATION WAS PERFORMED  Brenda Arnold 09/14/2019, 1:11 PM

## 2019-09-14 NOTE — Progress Notes (Signed)
[facility] ROUNDING NOTE   Subjective:   63 year old lady with history of diabetes end-stage renal disease Monday Wednesday Friday dialysis osteoarthritis both hips status post right total hip replacement and in need of left total hip replacement admitted 08/25/2019 with chest pain abnormal EKG found to be secondary to pericarditis.  CT scan of chest was negative for aneurysm pulmonary embolus.  Echocardiogram showed ejection fraction 65% with no pericardial effusion.  She is treated with colchicine.  She developed atrial fibrillation rapid heart rate started on amiodarone 08/07/2019.  She had worsening hypertension on 08/31/2019 was found to have a pericardial effusion with concerns of tamponade.  She was taken to the OR had a VATS procedure with right pericardial window by CT surgery.  Follow-up echo 09/03/2019 showed small pericardial effusion without evidence for tamponade with a left lateral pleural effusion status post pericardial tube which was discontinued on 09/04/2019.   Blood pressure 102/53 pulse 71 temperature 98.6 O2 sats 100% room air  Sodium 136 potassium 4.1 chloride 96 CO2 27 BUN 24 creatinine 7 glucose 83 calcium 8.7 phosphorus 1.8 albumin 2.8 WBC 8.7 hemoglobin 9.0 platelets 269  Amiodarone 200 mg daily apixaban 5 mg twice daily calcitriol 0.75 mcg Monday Wednesday Friday, colchicine 0.3 mg Monday Wednesday Friday darbepoetin 60 mcg last administered 09/10/2019, Neurontin 100 mg nightly, ProAmatine 10 mg 3 times daily, multivitamins 1 daily, VELPHORO 1 g 3 times daily   Objective:  Vital signs in last 24 hours:  Temp:  [97.9 F (36.6 C)-99 F (37.2 C)] 98.6 F (37 C) (10/13 0512) Pulse Rate:  [64-92] 71 (10/13 0512) Resp:  [17-20] 17 (10/13 0512) BP: (82-120)/(42-62) 102/53 (10/13 0512) SpO2:  [95 %-100 %] 99 % (10/13 0512) Weight:  [99.3 kg-103.2 kg] 103.2 kg (10/13 0512)  Weight change: 0.769 kg Filed Weights   09/13/19 1320 09/13/19 1730 09/14/19 0512   Weight: 103.1 kg 99.3 kg 103.2 kg    Intake/Output: I/O last 3 completed shifts: In: 600 [P.O.:600] Out: 2855 [Other:2855]   Intake/Output this shift:  Total I/O In: 120 [P.O.:120] Out: -   General:NAD, Obese female, laying in bed Heart:RRR Lungs:CTAB Abdomen:soft, NTND Extremities:no LE edema Dialysis Access: LU AVF +b/t   Basic Metabolic Panel: Recent Labs  Lab 09/08/19 0821 09/09/19 0226 09/11/19 0532 09/13/19 1002 09/14/19 0634  NA 136 134* 135 135 136  K 3.8 4.1 4.2 4.3 4.1  CL 100 95* 93* 93* 96*  CO2 27 24 28 27 27   GLUCOSE 121* 76 84 81 83  BUN 36* 17 16 45* 24*  CREATININE 9.77* 5.79* 6.13* 11.04* 7.04*  CALCIUM 8.4* 8.4* 8.8* 9.2 8.7*  PHOS  --   --   --   --  1.8*    Liver Function Tests: Recent Labs  Lab 09/11/19 0532 09/13/19 1002 09/14/19 0634  AST 26 20  --   ALT 16 16  --   ALKPHOS 108 122  --   BILITOT 0.8 0.6  --   PROT 6.1* 6.4*  --   ALBUMIN 2.7* 3.0* 2.8*   No results for input(s): LIPASE, AMYLASE in the last 168 hours. No results for input(s): AMMONIA in the last 168 hours.  CBC: Recent Labs  Lab 09/08/19 0821 09/09/19 0226 09/11/19 0532 09/13/19 1002 09/14/19 0634  WBC 14.9* 15.3* 10.4 10.8* 8.7  NEUTROABS  --   --  7.4 7.3 5.7  HGB 9.3* 9.4* 8.9* 9.6* 9.0*  HCT 29.5* 29.4* 27.1* 30.0* 29.1*  MCV 101.7* 100.3* 98.2 101.0*  101.4*  PLT 342 282 239 299 269    Cardiac Enzymes: No results for input(s): CKTOTAL, CKMB, CKMBINDEX, TROPONINI in the last 168 hours.  BNP: Invalid input(s): POCBNP  CBG: Recent Labs  Lab 09/13/19 0637 09/13/19 1147 09/13/19 1821 09/13/19 2116 09/14/19 0611  GLUCAP 73 82 75 118* 84    Microbiology: Results for orders placed or performed during the hospital encounter of 08/25/19  SARS CORONAVIRUS 2 (TAT 6-24 HRS) Nasopharyngeal Nasopharyngeal Swab     Status: None   Collection Time: 08/25/19 12:22 PM   Specimen: Nasopharyngeal Swab  Result Value Ref Range Status   SARS Coronavirus  2 NEGATIVE NEGATIVE Final    Comment: (NOTE) SARS-CoV-2 target nucleic acids are NOT DETECTED. The SARS-CoV-2 RNA is generally detectable in upper and lower respiratory specimens during the acute phase of infection. Negative results do not preclude SARS-CoV-2 infection, do not rule out co-infections with other pathogens, and should not be used as the sole basis for treatment or other patient management decisions. Negative results must be combined with clinical observations, patient history, and epidemiological information. The expected result is Negative. Fact Sheet for Patients: SugarRoll.be Fact Sheet for Healthcare Providers: https://www.woods-mathews.com/ This test is not yet approved or cleared by the [place] FDA and  has been authorized for detection and/or diagnosis of SARS-CoV-2 by FDA under an Emergency Use Authorization (EUA). This EUA will remain  in effect (meaning this test can be used) for the duration of the COVID-19 declaration under Section 56 4(b)(1) of the Act, 21 U.S.C. section 360bbb-3(b)(1), unless the authorization is terminated or revoked sooner. Performed at [facility], [place] [address] [place], [place] [address]   Surgical PCR screen     Status: None   Collection Time: 08/31/19 11:26 AM   Specimen: Nasal Swab  Result Value Ref Range Status   MRSA, PCR NEGATIVE NEGATIVE Final   Staphylococcus aureus NEGATIVE NEGATIVE Final    Comment: (NOTE) The Xpert SA Assay (FDA approved for NASAL specimens in patients 6 years of age and older), is one component of a comprehensive surveillance program. It is not intended to diagnose infection nor to guide or monitor treatment. Performed at [facility], [place] [address], [place], [place] [address]   Aerobic/Anaerobic Culture (surgical/deep wound)     Status: None   Collection Time: 08/31/19  2:17 PM   Specimen: PATH Cytology Misc. fluid; Body Fluid  Result  Value Ref Range Status   Specimen Description PERICARDIAL  Final   Special Requests NONE  Final   Gram Stain   Final    MODERATE WBC PRESENT,BOTH PMN AND MONONUCLEAR NO ORGANISMS SEEN    Culture   Final    No growth aerobically or anaerobically. Performed at [facility], [place] [address] [place], [place] [address]    Report Status 09/05/2019 FINAL  Final  Aerobic/Anaerobic Culture (surgical/deep wound)     Status: None   Collection Time: 08/31/19  2:29 PM   Specimen: Pericardial; Tissue  Result Value Ref Range Status   Specimen Description PERICARDIAL  Final   Special Requests NONE  Final   Gram Stain   Final    FEW WBC PRESENT, PREDOMINANTLY MONONUCLEAR NO ORGANISMS SEEN    Culture   Final    No growth aerobically or anaerobically. Performed at [facility], [place] [address] [place], [place] [address]    Report Status 09/05/2019 FINAL  Final    Coagulation Studies: No results for input(s):  LABPROT, INR in the last 72 hours.  Urinalysis: No results for input(s): COLORURINE, LABSPEC, PHURINE, GLUCOSEU, HGBUR, BILIRUBINUR, KETONESUR, PROTEINUR, UROBILINOGEN, NITRITE, LEUKOCYTESUR in the last 72 hours.  Invalid input(s): APPERANCEUR    Imaging: No results found.   Medications:    . amiodarone  200 mg Oral Daily  . apixaban  5 mg Oral BID  . calcitRIOL  0.75 mcg Oral Q M,W,F-HD  . Chlorhexidine Gluconate Cloth  6 each Topical Q0600  . colchicine  0.3 mg Oral Q M,W,F-HD  . [START ON 09/17/2019] darbepoetin (ARANESP) injection - DIALYSIS  60 mcg Intravenous Q Fri-HD  . docusate sodium  100 mg Oral BID  . gabapentin  100 mg Oral QHS  . mouth rinse  15 mL Mouth Rinse BID  . midodrine  10 mg Oral TID WC  . multivitamin  1 tablet Oral QHS  . sucroferric oxyhydroxide  1,000 mg Oral TID WC & HS   acetaminophen, bisacodyl, diphenhydrAMINE, guaiFENesin-dextromethorphan, menthol-cetylpyridinium, Muscle Rub, polyethylene glycol, prochlorperazine **OR**  prochlorperazine **OR** prochlorperazine, senna-docusate, traMADol, traZODone  Assessment/ Plan:   ESRD-Monday Wednesday Friday dialysis.  Tolerated 2.8 L ultrafiltration 09/13/2019.  Next dialysis scheduled for 09/15/2019  Debility continues on rehab  Pericardial effusion/tamponade EF 60 % was treated with pericardial window 08/31/2019 due to hypotension and symptomatic pericardial effusion appreciate assistance from cardiothoracic surgery.  Drain has been removed on 09/04/2019.  She continues on colchicine 0.36 mg Monday Wednesday Friday for period of 3 months.  Anemia stable 60 mcg last administered 09/10/2019 T sats 20%  Secondary hyperparathyroidism calcium appears to be at goal possibly a little high corrected for low albumin.  We will continue to follow.  Patient was receiving Parsabiv as an outpatient this is not an hospital formulary at this present time.  We will hold Velphoro due to hypophosphatemia  Chronic hypertension continue midodrine 10 mg 3 times a day.  Left AV fistula aneurysms follow with Dr. Donnetta Hutching safe to use with no pending bleeding  NSVT followed by cards  Atrial fibrillation followed by primary team anticoagulated rate controlled   LOS: [place] @TODAY @8 :51 AM

## 2019-09-14 NOTE — Progress Notes (Signed)
Occupational Therapy Session Note  Patient Details  Name: GLENNA KETCHAM MRN: [id number] Date of Birth: Mar 08, 1956  Today's Date: 09/14/2019 OT Individual Time: 0700-0810 OT Individual Time Calculation (min): 70 min    Short Term Goals: Week 1:  OT Short Term Goal 1 (Week 1): STG = LTGs due to ELOS  Skilled Therapeutic Interventions/Progress Updates:    OT intervention with focus on activity tolerance, safety awareness, BADL training including bathing at shower level and dressing with sit<>stand from EOB, functional amb with RW, and standing balance to increase independence with BADLs. Pt amb with RW in room to gather supplies before entering bathroom.  Pt completed all bathing/dressing tasks at supervision level with no unsafe behaviors noted. Pt completed grooming tasks seated in w/c at sink.  Pt amb with RW in hallway (130'X2) with one seated rest break. Pt returned to room and remained in w/c with all needs within reach and seat alarm activated.   Therapy Documentation Precautions:  Precautions Precautions: Fall Restrictions Weight Bearing Restrictions: No Pain:  Pt with no c/o of pain this morning  Therapy/Group: Individual Therapy  [name] 09/14/2019, 8:10 AM

## 2019-09-14 NOTE — Progress Notes (Signed)
Occupational Therapy Session Note  Patient Details  Name: Brenda Arnold MRN: [id number] Date of Birth: 03/21/1956  Today's Date: 09/14/2019 OT Individual Time: 1100-1155 OT Individual Time Calculation (min): 55 min    Short Term Goals: Week 1:  OT Short Term Goal 1 (Week 1): STG = LTGs due to ELOS  Skilled Therapeutic Interventions/Progress Updates:    Pt resting in w/c upon arrival.  OT intervention with focus on functional amb with RW, simple kitchen tasks, simulated laundry tasks, simulated home mgmt tasks, discharge planning, activity tolerance, and safety awareness to increase independence with BADLs. Pt amb with RW from room to tub room (143'). Pt practiced TTB transfers at supervision level.  Discussed suction grab bar use at home for standing balance.  Pt amb with RW to ADL kitchen and practiced placing and removing items from refrigerator, oven, stove top, and microwave. Pt practiced removing items from drawers in bedroom and placing fitted sheet on mattress. Pt described how she managed laundry tasks at home.  Pt utilizes safe techniques and use of RW for all tasks.  Pt completes vacuuming at home while seated. Pt amb with RW back to room and returned to w/c.  Pt remained in w/c with all needs within reach and seat alarm activated.   Therapy Documentation Precautions:  Precautions Precautions: Fall Restrictions Weight Bearing Restrictions: No  Pain: Pt denies pain this morning  Therapy/Group: Individual Therapy  [name] 09/14/2019, 12:10 PM

## 2019-09-15 ENCOUNTER — Inpatient Hospital Stay (HOSPITAL_COMMUNITY): Payer: BC Managed Care – PPO

## 2019-09-15 LAB — CBC
HCT: 28.2 % — ABNORMAL LOW (ref 36.0–46.0)
Hemoglobin: 8.7 g/dL — ABNORMAL LOW (ref 12.0–15.0)
MCH: 31.8 pg (ref 26.0–34.0)
MCHC: 30.9 g/dL (ref 30.0–36.0)
MCV: 102.9 fL — ABNORMAL HIGH (ref 80.0–100.0)
Platelets: 294 10*3/uL (ref 150–400)
RBC: 2.74 MIL/uL — ABNORMAL LOW (ref 3.87–5.11)
RDW: 17.3 % — ABNORMAL HIGH (ref 11.5–15.5)
WBC: 10.3 10*3/uL (ref 4.0–10.5)
nRBC: 0 % (ref 0.0–0.2)

## 2019-09-15 LAB — GLUCOSE, CAPILLARY
Glucose-Capillary: 74 mg/dL (ref 70–99)
Glucose-Capillary: 75 mg/dL (ref 70–99)
Glucose-Capillary: 77 mg/dL (ref 70–99)
Glucose-Capillary: 74 mg/dL (ref 70–99)
Glucose-Capillary: 114 mg/dL — ABNORMAL HIGH (ref 70–99)

## 2019-09-15 LAB — RENAL FUNCTION PANEL
Albumin: 2.9 g/dL — ABNORMAL LOW (ref 3.5–5.0)
Anion gap: 15 (ref 5–15)
BUN: 40 mg/dL — ABNORMAL HIGH (ref 8–23)
CO2: 27 mmol/L (ref 22–32)
Calcium: 9.1 mg/dL (ref 8.9–10.3)
Chloride: 95 mmol/L — ABNORMAL LOW (ref 98–111)
Creatinine, Ser: 9.48 mg/dL — ABNORMAL HIGH (ref 0.44–1.00)
GFR calc Af Amer: 5 mL/min — ABNORMAL LOW (ref >60)
GFR calc non Af Amer: 4 mL/min — ABNORMAL LOW (ref >60)
Glucose, Bld: 98 mg/dL (ref 70–99)
Phosphorus: 3 mg/dL (ref 2.5–4.6)
Potassium: 4.1 mmol/L (ref 3.5–5.1)
Sodium: 137 mmol/L (ref 135–145)

## 2019-09-15 MED ORDER — SODIUM CHLORIDE 0.9 % IV SOLN: 100.0000 mL | INTRAVENOUS | Status: DC | PRN

## 2019-09-15 MED ORDER — LIDOCAINE HCL (PF) 1 % IJ SOLN: 5.0000 mL | INTRAMUSCULAR | Status: DC | PRN

## 2019-09-15 MED ORDER — LIDOCAINE-PRILOCAINE 2.5-2.5 % EX CREA
1.0000 "application " | TOPICAL_CREAM | CUTANEOUS | Status: DC | PRN
  Filled 2019-09-15: qty 5

## 2019-09-15 MED ORDER — PENTAFLUOROPROP-TETRAFLUOROETH EX AERO: 1.0000 "application " | INHALATION_SPRAY | CUTANEOUS | Status: DC | PRN

## 2019-09-15 MED ORDER — ALTEPLASE 2 MG IJ SOLR
2.0000 mg | Freq: Once | INTRAMUSCULAR | Status: DC | PRN
  Filled 2019-09-15: qty 2

## 2019-09-15 MED ORDER — HEPARIN SODIUM (PORCINE) 1000 UNIT/ML DIALYSIS
1000.0000 [IU] | INTRAMUSCULAR | Status: DC | PRN
  Filled 2019-09-15: qty 1

## 2019-09-15 NOTE — Progress Notes (Signed)
[facility] ROUNDING NOTE   Subjective:   63 year old lady with history of diabetes end-stage renal disease Monday Wednesday Friday dialysis osteoarthritis both hips status post right total hip replacement and in need of left total hip replacement admitted 08/25/2019 with chest pain abnormal EKG found to be secondary to pericarditis.  CT scan of chest was negative for aneurysm pulmonary embolus.  Echocardiogram showed ejection fraction 65% with no pericardial effusion.  She is treated with colchicine.  She developed atrial fibrillation rapid heart rate started on amiodarone 08/07/2019.  She had worsening hypertension on 08/31/2019 was found to have a pericardial effusion with concerns of tamponade.  She was taken to the OR had a VATS procedure with right pericardial window by CT surgery.  Follow-up echo 09/03/2019 showed small pericardial effusion without evidence for tamponade with a left lateral pleural effusion status post pericardial tube which was discontinued on 09/04/2019.   Blood pressure 141/59 pulse 86 temperature 99.1 O2 sats 100% room air  Sodium 136 potassium 4.1 chloride 96 CO2 27 BUN 24 creatinine 7 glucose 83 calcium 8.7 phosphorus 1.8 albumin 2.8 WBC 8.7 hemoglobin 9.0 platelets 269  Amiodarone 200 mg daily apixaban 5 mg twice daily calcitriol 0.75 mcg Monday Wednesday Friday, colchicine 0.3 mg Monday Wednesday Friday darbepoetin 60 mcg last administered 09/10/2019, Neurontin 100 mg nightly, ProAmatine 10 mg 3 times daily, multivitamins 1 daily, VELPHORO 1 g 3 times daily   Objective:  Vital signs in last 24 hours:  Temp:  [98.2 F (36.8 C)-99.1 F (37.3 C)] 99.1 F (37.3 C) (10/14 0436) Pulse Rate:  [79-86] 86 (10/14 0436) Resp:  [17-18] 18 (10/14 0436) BP: (88-141)/(50-60) 141/59 (10/14 0436) SpO2:  [99 %-100 %] 100 % (10/14 0436) Weight:  [103.3 kg] 103.3 kg (10/14 0436)  Weight change: 0.2 kg Filed Weights   09/13/19 1730 09/14/19 0512 09/15/19 0436  Weight:  99.3 kg 103.2 kg 103.3 kg    Intake/Output: I/O last 3 completed shifts: In: 480 [P.O.:480] Out: -    Intake/Output this shift:  No intake/output data recorded.  General:NAD, Obese female, laying in bed Heart:RRR Lungs:CTAB Abdomen:soft, NTND Extremities:no LE edema Dialysis Access: LU AVF +b/t   Basic Metabolic Panel: Recent Labs  Lab 09/08/19 0821 09/09/19 0226 09/11/19 0532 09/13/19 1002 09/14/19 0634  NA 136 134* 135 135 136  K 3.8 4.1 4.2 4.3 4.1  CL 100 95* 93* 93* 96*  CO2 27 24 28 27 27   GLUCOSE 121* 76 84 81 83  BUN 36* 17 16 45* 24*  CREATININE 9.77* 5.79* 6.13* 11.04* 7.04*  CALCIUM 8.4* 8.4* 8.8* 9.2 8.7*  PHOS  --   --   --   --  1.8*    Liver Function Tests: Recent Labs  Lab 09/11/19 0532 09/13/19 1002 09/14/19 0634  AST 26 20  --   ALT 16 16  --   ALKPHOS 108 122  --   BILITOT 0.8 0.6  --   PROT 6.1* 6.4*  --   ALBUMIN 2.7* 3.0* 2.8*   No results for input(s): LIPASE, AMYLASE in the last 168 hours. No results for input(s): AMMONIA in the last 168 hours.  CBC: Recent Labs  Lab 09/08/19 0821 09/09/19 0226 09/11/19 0532 09/13/19 1002 09/14/19 0634  WBC 14.9* 15.3* 10.4 10.8* 8.7  NEUTROABS  --   --  7.4 7.3 5.7  HGB 9.3* 9.4* 8.9* 9.6* 9.0*  HCT 29.5* 29.4* 27.1* 30.0* 29.1*  MCV 101.7* 100.3* 98.2 101.0* 101.4*  PLT 342 282  239 299 269    Cardiac Enzymes: No results for input(s): CKTOTAL, CKMB, CKMBINDEX, TROPONINI in the last 168 hours.  BNP: Invalid input(s): POCBNP  CBG: Recent Labs  Lab 09/14/19 0611 09/14/19 1205 09/14/19 1734 09/14/19 2114 09/15/19 0609  GLUCAP 84 73 76 106* 74    Microbiology: Results for orders placed or performed during the hospital encounter of 08/25/19  SARS CORONAVIRUS 2 (TAT 6-24 HRS) Nasopharyngeal Nasopharyngeal Swab     Status: None   Collection Time: 08/25/19 12:22 PM   Specimen: Nasopharyngeal Swab  Result Value Ref Range Status   SARS Coronavirus 2 NEGATIVE NEGATIVE Final     Comment: (NOTE) SARS-CoV-2 target nucleic acids are NOT DETECTED. The SARS-CoV-2 RNA is generally detectable in upper and lower respiratory specimens during the acute phase of infection. Negative results do not preclude SARS-CoV-2 infection, do not rule out co-infections with other pathogens, and should not be used as the sole basis for treatment or other patient management decisions. Negative results must be combined with clinical observations, patient history, and epidemiological information. The expected result is Negative. Fact Sheet for Patients: SugarRoll.be Fact Sheet for Healthcare Providers: https://www.woods-mathews.com/ This test is not yet approved or cleared by the [place] FDA and  has been authorized for detection and/or diagnosis of SARS-CoV-2 by FDA under an Emergency Use Authorization (EUA). This EUA will remain  in effect (meaning this test can be used) for the duration of the COVID-19 declaration under Section 56 4(b)(1) of the Act, 21 U.S.C. section 360bbb-3(b)(1), unless the authorization is terminated or revoked sooner. Performed at [facility], [place] [address] [place], [place] [address]   Surgical PCR screen     Status: None   Collection Time: 08/31/19 11:26 AM   Specimen: Nasal Swab  Result Value Ref Range Status   MRSA, PCR NEGATIVE NEGATIVE Final   Staphylococcus aureus NEGATIVE NEGATIVE Final    Comment: (NOTE) The Xpert SA Assay (FDA approved for NASAL specimens in patients 78 years of age and older), is one component of a comprehensive surveillance program. It is not intended to diagnose infection nor to guide or monitor treatment. Performed at [facility], [place] [address] [place], [place] [address]   Aerobic/Anaerobic Culture (surgical/deep wound)     Status: None   Collection Time: 08/31/19  2:17 PM   Specimen: PATH Cytology Misc. fluid; Body Fluid  Result Value Ref Range Status    Specimen Description PERICARDIAL  Final   Special Requests NONE  Final   Gram Stain   Final    MODERATE WBC PRESENT,BOTH PMN AND MONONUCLEAR NO ORGANISMS SEEN    Culture   Final    No growth aerobically or anaerobically. Performed at [facility], [place] [address], [place], [place] [address]    Report Status 09/05/2019 FINAL  Final  Aerobic/Anaerobic Culture (surgical/deep wound)     Status: None   Collection Time: 08/31/19  2:29 PM   Specimen: Pericardial; Tissue  Result Value Ref Range Status   Specimen Description PERICARDIAL  Final   Special Requests NONE  Final   Gram Stain   Final    FEW WBC PRESENT, PREDOMINANTLY MONONUCLEAR NO ORGANISMS SEEN    Culture   Final    No growth aerobically or anaerobically. Performed at [facility], [place] [address], [place],  [address]    Report Status 09/05/2019 FINAL  Final    Coagulation Studies: No results for input(s): LABPROT, INR in the last  72 hours.  Urinalysis: No results for input(s): COLORURINE, LABSPEC, PHURINE, GLUCOSEU, HGBUR, BILIRUBINUR, KETONESUR, PROTEINUR, UROBILINOGEN, NITRITE, LEUKOCYTESUR in the last 72 hours.  Invalid input(s): APPERANCEUR    Imaging: No results found.   Medications:    . amiodarone  200 mg Oral Daily  . apixaban  5 mg Oral BID  . calcitRIOL  0.75 mcg Oral Q M,W,F-HD  . Chlorhexidine Gluconate Cloth  6 each Topical Q0600  . colchicine  0.3 mg Oral Q M,W,F-HD  . [START ON 09/17/2019] darbepoetin (ARANESP) injection - DIALYSIS  60 mcg Intravenous Q Fri-HD  . docusate sodium  100 mg Oral BID  . gabapentin  100 mg Oral QHS  . mouth rinse  15 mL Mouth Rinse BID  . midodrine  10 mg Oral TID WC  . multivitamin  1 tablet Oral QHS   acetaminophen, bisacodyl, diphenhydrAMINE, guaiFENesin-dextromethorphan, menthol-cetylpyridinium, Muscle Rub, polyethylene glycol, prochlorperazine **OR** prochlorperazine **OR** prochlorperazine, senna-docusate, traMADol, traZODone  Assessment/  Plan:   ESRD-Monday Wednesday Friday dialysis.  Tolerated 2.8 L ultrafiltration 09/13/2019.  Next dialysis scheduled for 09/15/2019  Debility continues on rehab  Pericardial effusion/tamponade EF 60 % was treated with pericardial window 08/31/2019 due to hypotension and symptomatic pericardial effusion appreciate assistance from cardiothoracic surgery.  Drain has been removed on 09/04/2019.  She continues on colchicine 0.36 mg Monday Wednesday Friday for period of 3 months.  Anemia stable 60 mcg last administered 09/10/2019 T sats 20%  Secondary hyperparathyroidism calcium appears to be at goal possibly a little high corrected for low albumin.  We will continue to follow.  Patient was receiving Parsabiv as an outpatient this is not an hospital formulary at this present time.  We will hold Velphoro due to hypophosphatemia  Chronic hypertension continue midodrine 10 mg 3 times a day.  Left AV fistula aneurysms follow with Dr. Donnetta Hutching safe to use with no pending bleeding  NSVT followed by cards  Atrial fibrillation followed by primary team anticoagulated rate controlled   LOS: [place] @TODAY @7 :41 AM

## 2019-09-15 NOTE — Patient Care Conference (Signed)
Inpatient RehabilitationTeam Conference and Plan of Care Update Date: 09/15/2019   Time: 10:30 AM    Patient Name: Brenda Arnold      Medical Record Number: [id number]  Date of Birth: 06/12/1956 Sex: Female         Room/Bed: 4M10C/4M10C-01 Payor Info: Payor: [place] / Plan: BCBS COMM PPO / Product Type: *No Product type* /    Admit Date/Time:  09/09/2019  4:09 PM  Primary Diagnosis:  Physical debility  Patient Active Problem List   Diagnosis Date Noted  . Physical debility 09/09/2019  . Pericardial effusion   . SOB (shortness of breath)   . Leukocytosis   . Anemia of chronic disease   . PAF (paroxysmal atrial fibrillation) ([place])   . Postoperative pain   . Cardiac tamponade   . Chest pain at rest 08/26/2019  . Tachycardia 08/26/2019  . Pericarditis 08/26/2019  . Angina pectoris, unspecified ([place]) 08/25/2019  . CAD (coronary artery disease) 11/05/2018  . A-fib ([place]) 11/05/2018  . Atrial fibrillation with RVR ([place])   . Hypotension 11/04/2018  . ESRD on hemodialysis ([place]) 11/04/2018  . DVT (deep venous thrombosis) ([place]) 11/03/2018  . Primary osteoarthritis of right hip 09/15/2018  . Status post total replacement of right hip 09/15/2018  . Varicose veins of bilateral lower extremities with other complications 0000000  . Anticoagulation goal of INR 2 to 3 09/30/2014  . Pain in lower limb 05/02/2014  . Onychomycosis due to dermatophyte 04/06/2013  . Pain in joint, ankle and foot 04/06/2013  . Bradycardia 11/06/2012  . Left-sided epistaxis 11/05/2012  . DM (diabetes mellitus) ([place]) 11/05/2012  . OSA (obstructive sleep apnea) 11/05/2012  . Acute posterior epistaxis 11/05/2012  . CKD (chronic kidney disease) 11/05/2012  . End stage renal disease ([place]) 06/25/2012  . Type 2 diabetes mellitus with complication, without long-term current use of insulin ([place]) 12/20/2009  . OBESITY 12/20/2009  . OBESITY-MORBID (>100') 12/20/2009  . Essential hypertension  12/20/2009  . Paroxysmal atrial fibrillation ([place]) 12/20/2009  . Chest pain 12/20/2009  . ABNORMAL CV (STRESS) TEST 12/20/2009    Expected Discharge Date: Expected Discharge Date: 09/18/19(if medically stable)  Team Members Present: Physician leading conference: Dr. Delice Lesch Social Worker Present: Ovidio Kin, LCSW Nurse Present: Romilda Garret, LPN PT Present: Apolinar Junes, PT OT Present: Willeen Cass, OT;Roanna Epley, COTA PPS Coordinator present : Gunnar Fusi, SLP     Current Status/Progress Goal Weekly Team Focus  Bowel/Bladder   anuric continent of B/B LBM 10/14  remain continent with normal bowel pattern  assist prn laxatives prn   Swallow/Nutrition/ Hydration             ADL's   supervision overall  mod I overall  endurance, education, safety awareness, IADLs   Mobility   Supervision overall.  Mod I overall  Activity tolerance, dynamic balance, gait training, stair training, d/c planning, and patient education.   Communication             Safety/Cognition/ Behavioral Observations            Pain   pt denies any pain  free of pain  assess pain qshift and prn   Skin   skin intact  skin remain intact free of infection  assess skin qshift and prn    Rehab Goals Patient on target to meet rehab goals: Yes Rehab Goals Revised: None *See Care Plan and progress notes for long and short-term goals.     Barriers to Discharge  Current Status/Progress Possible Resolutions Date Resolved   Nursing                  PT  Home environment access/layout  has 1 STE in back without rails and 4 STE in front without rails  currently requires supervision for stairs with RW, needs to be mod I for d/c           OT                  SLP                SW                Discharge Planning/Teaching Needs:  HOme with intermittent assist from friends, doing so well Mom does not need to come to assist at DC.      Team Discussion:  Reaching goals of mod/i level.  Medically experiencing chest pain and having EKG and radiology. Fevers are resolved and chronic issues are stable. Building her endurance ans strength. Ready for DC Sat if medically stable.  Revisions to Treatment Plan:  DC 10/17    Medical Summary Current Status: s/p cardiac issues- doing well with that; ESRD Weekly Focus/Goal: maintaining patient's electrolytes  Barriers to Discharge: Decreased family/caregiver support;Hemodialysis;Medical stability  Barriers to Discharge Comments: n/a Possible Resolutions to Barriers: working with nephrology   Continued Need for Acute Rehabilitation Level of Care: The patient requires daily medical management by a physician with specialized training in physical medicine and rehabilitation for the following reasons: Direction of a multidisciplinary physical rehabilitation program to maximize functional independence : Yes Medical management of patient stability for increased activity during participation in an intensive rehabilitation regime.: Yes Analysis of laboratory values and/or radiology reports with any subsequent need for medication adjustment and/or medical intervention. : Yes   I attest that I was present, lead the team conference, and concur with the assessment and plan of the team. Team conference was held via web/ teleconference due to [place], [place] Rhyme 09/16/2019, 8:56 AM

## 2019-09-15 NOTE — Progress Notes (Signed)
[facility] PHYSICAL MEDICINE & REHABILITATION PROGRESS NOTE   Subjective/Complaints:   Pt reports walked 158 ft, rested then another 158 ft with RW this AM- less SOB/winded.  No other issues.   ROS: denies CP, SOB, N/V/D   Objective:   No results found. Recent Labs    09/13/19 1002 09/14/19 0634  WBC 10.8* 8.7  HGB 9.6* 9.0*  HCT 30.0* 29.1*  PLT 299 269   Recent Labs    09/13/19 1002 09/14/19 0634  NA 135 136  K 4.3 4.1  CL 93* 96*  CO2 27 27  GLUCOSE 81 83  BUN 45* 24*  CREATININE 11.04* 7.04*  CALCIUM 9.2 8.7*    Intake/Output Summary (Last 24 hours) at 09/15/2019 0903 Last data filed at 09/15/2019 0831 Gross per 24 hour  Intake 600 ml  Output -  Net 600 ml     Physical Exam: Vital Signs Blood pressure (!) 141/59, pulse 86, temperature 99.1 F (37.3 C), temperature source Oral, resp. rate 18, weight 103.3 kg, SpO2 100 %.  Physical Exam  Nursing note, and vitals reviewed. Constitutional: pt awake, alert, appropriate,obese sitting on EOB in room, watching TV, NAD HENT:  Head: Normocephalic and atraumatic.  CV: RRR   Eyes:EOMI, no injected Neck: No tracheal deviation present. No thyromegaly present.  Respiratory: CTA B/L- no W/R/R Right prior CT suture under right breast.    GI: abd protuberant, soft, NT, ND, (+)hypoactive BS  Musculoskeletal:        General: No edema.     Comments: Left wrist with graft - palpable  Neurological: She is alert and oriented to person, place, and time.  Motor: Grossly 5/5 throughout  Skin: Skin is warm and dry. No rash noted. No erythema.  Vascular changes b/l LE  Psychiatric: full, affect      Assessment/Plan: 1. Functional deficits secondary to debility due to pericarditis/pericardial tamponade which require 3+ hours per day of interdisciplinary therapy in a comprehensive inpatient rehab setting.  Physiatrist is providing close team supervision and 24 hour management of active medical problems listed  below.  Physiatrist and rehab team continue to assess barriers to discharge/monitor patient progress toward functional and medical goals  Care Tool:  Bathing    Body parts bathed by patient: Right arm, Left arm, Chest, Abdomen, Front perineal area, Buttocks, Right upper leg, Left upper leg, Right lower leg, Left lower leg, Face         Bathing assist Assist Level: Supervision/Verbal cueing     Upper Body Dressing/Undressing Upper body dressing   What is the patient wearing?: Pull over shirt    Upper body assist Assist Level: Independent    Lower Body Dressing/Undressing Lower body dressing      What is the patient wearing?: Pants     Lower body assist Assist for lower body dressing: Supervision/Verbal cueing     Toileting Toileting    Toileting assist Assist for toileting: Supervision/Verbal cueing     Transfers Chair/bed transfer  Transfers assist     Chair/bed transfer assist level: Supervision/Verbal cueing Chair/bed transfer assistive device: Programmer, multimedia   Ambulation assist      Assist level: Supervision/Verbal cueing Assistive device: Walker-rolling Max distance: 100'   Walk 10 feet activity   Assist     Assist level: Supervision/Verbal cueing Assistive device: Walker-rolling   Walk 50 feet activity   Assist    Assist level: Supervision/Verbal cueing Assistive device: Walker-rolling    Walk 150 feet activity  Assist Walk 150 feet activity did not occur: Safety/medical concerns  Assist level: Contact Guard/Touching assist Assistive device: Walker-rolling    Walk 10 feet on uneven surface  activity   Assist     Assist level: Contact Guard/Touching assist(ramp) Assistive device: Aeronautical engineer Will patient use wheelchair at discharge?: No Type of Wheelchair: Manual    Wheelchair assist level: Supervision/Verbal cueing Max wheelchair distance: 75 feet    Wheelchair  50 feet with 2 turns activity    Assist        Assist Level: Supervision/Verbal cueing   Wheelchair 150 feet activity     Assist          Blood pressure (!) 141/59, pulse 86, temperature 99.1 F (37.3 C), temperature source Oral, resp. rate 18, weight 103.3 kg, SpO2 100 %.  Medical Problem List and Plan: 1.  Debility secondary to pericarditis with pericardial tamponade.  CIR PT,  OT   10/13- walked 260 ft with 1 rest break with RW 2.  Antithrombotics: -DVT/anticoagulation:  Pharmaceutical: Other (comment)--Eliquis             -antiplatelet therapy: N/A 3. Left hip pain/Pain Management:  On gabapentin 100 mg/hs. Tylenol prn. Will add lidocaine patch for pain control.              Monitor with increased mobility 4. Mood: Very motivated to work hard and get home. LCSW to follow for evaluation and support.             -antipsychotic agents: N/A 5. Neuropsych: This patient is capable of making decisions on her own behalf. 6. Skin/Wound Care: Routine pressure relief measures.  7. Fluids/Electrolytes/Nutrition: Strict I/O. 1200 cc FR/day. Labs with HP. 8. Pericarditis: On Colchicine MWF X 3 months. Monitor for agranulocytosis.   9. T2DM: Hgb A 1c- 4.9. Blood sugars tightly controlled on SSI.    CBG (last 3)  Recent Labs    09/14/19 1734 09/14/19 2114 09/15/19 0609  GLUCAP 76 106* 74   Normoglycemic with normal A1C d/c SSI  Does not check CBG at home              Monitor with increased mobility 10. PAF: Now in NSR on amiodarone 200 mg bid--->taper to 200 mg daily starting 10/11.               Last EKG 10/3 Afib and RVR, per exam is regular rate nd rythmn on Eliquis for CVA prophyllaxis no repeat EKG needed at this time  11. Chronic hypotension: Baseline SBP 80-90's range on midodrine tid. Reports can drop to 50's with HD.              Monitor for signs and symptoms Ace wraps/teds/abdominal binder if necessary. 12.  ESRD: HD MWF at end of the day to help with tolerance  of therapy.  13. Anemia of chronic disease: Being monitored. On weekly Aranesp.  Labs with HD. 14. Leucocytosis: resolved , unable to void and  difficult cath d/c UA C and S .  10/12- T 99.3 and WBC 10.8- will monitor CLOSELY  10/13- WBC resolved and no more low grade temps 15.  Morbidly obese: Encouraged weight loss     LOS: 6 days A FACE TO FACE EVALUATION WAS PERFORMED  Brenda Arnold 09/15/2019, 9:03 AM

## 2019-09-15 NOTE — Progress Notes (Signed)
Occupational Therapy Session Note  Patient Details  Name: Brenda Arnold MRN: [id number] Date of Birth: 09/10/1956  Today's Date: 09/15/2019 OT Individual Time: 0700-0810 OT Individual Time Calculation (min): 70 min    Short Term Goals: Week 1:  OT Short Term Goal 1 (Week 1): STG = LTGs due to ELOS  Skilled Therapeutic Interventions/Progress Updates:    OT intervention with focus on continued activity tolerance, funcitonal amb with RW for simple home mgmt tasks, BADL training including bathing at shower level and dressing with sit<>stand from EOB, and safety awareness to increase independence with BADLs. Pt amb with RW in room to gather supplies prior to entering bathroom for shower.  Pt completed shower and dressing tasks this morning with supervision.  Pt completes grooming tasks seated in w/c (she does this at home also). After a brief rest, pt amb 158' with RW before taking a seated rest break.  Pt amb back to room and returned to recliner.  Pt remained in recliner with all needs within reach.  Pt requires more than a reasonable amount of time to complete tasks with multiple rest breaks.    Therapy Documentation Precautions:  Precautions Precautions: Fall Restrictions Weight Bearing Restrictions: No Pain:  Pt denies pain   Therapy/Group: Individual Therapy  [name] 09/15/2019, 8:10 AM

## 2019-09-15 NOTE — Progress Notes (Signed)
Nurse informed pt is experiencing chest pain. VS 113/69, 86 P, 100 O2, 12 R unlabored. Pt stated, pain feels like a pinch when she breaths on Left side of chest. Pt BS is 75. Pam Love-PA informed, orders for EKG, Tylenol, and heat. EKG results hand delivered to PA, Pt off unit to radiology. When lying down pinch of pain is not as severe. Pt is stable, will continue to monitor. Larina Earthly, LPN

## 2019-09-15 NOTE — Progress Notes (Signed)
Patient developed left chest wall pain during therapy --did car transfer and pushing off from chair when symptoms started. Pain stinging in nature and comes and goes--hasn't had this before. Not reproducible with pressure. EKG without tachycardia. Will order CXR to rule out worsening of effusion. Treat with local measures for now.

## 2019-09-15 NOTE — Progress Notes (Signed)
Social Work Patient ID: Brenda Arnold, female   DOB: 02/14/1956, 63 y.o.   MRN: [id number]  Met with pt to discuss team conference goals-mod/i level along with discharge 10/17. Discussed follow up therapies she prefers OP due to has the equipment she needs. Will make referral and work toward discharge on Sat. She is feeling better from this am with CP issues.  

## 2019-09-15 NOTE — Progress Notes (Addendum)
Physical Therapy Session Note  Patient Details  Name: Brenda Arnold MRN: [id number] Date of Birth: Feb 21, 1956  Today's Date: 09/15/2019 PT Individual Time: 0900-0955 PT Individual Time Calculation (min): 55 min   Short Term Goals: Week 1:  PT Short Term Goal 1 (Week 1): = to LTGs based on ELOS  Skilled Therapeutic Interventions/Progress Updates:     Patient in recliner in the room upon PT arrival. Patient alert and agreeable to PT session. Patient denied pain throughout session.   Therapeutic Activity: Transfers: Patient performed sit to/from stand supervision-mod I with RW. Provided verbal cues for to reach back when sitting for safety. Performed a car transfer with low sedan height to simulate her friend's vehicle and furniture transfer from a sofa with supervision using the RW. Provided cues for pushing up with B UEs rather than holding on to the RW.  Gait Training:  Patient ambulated 100-150 feet using RW with supervision-mod I. Ambulated with decreased gait speed, decreased step length, increased hip and knee flexion, forward trunk lean, and downward head gaze. Provided verbal cues for walking close to the RW for safety and erect posture. She ambulated 10 feet without the RW with close supervision for balance, safety. She ambulated up/down a ramp, over 23ft of mulch, and up/down a 7" step using the RW x2 with supervision. During the second trial she carried a full laundry bag placed over her RW to simulate taking out the garbage with close supervision.  Patient in recliner in room and reported sudden onset of pinching/stabbing in hier L side of her chest near the axilla that progressed to throbbing in her L chest and arm. Vitals: BP 113/69, HR 89, SPO2 100% on RA. RN notified of patient's symptoms and vitals and on the way to the patient's room at end of session. Patient missed 5 min of skilled PT due to chest pain.  Therapy Documentation Precautions:  Precautions Precautions:  Fall Restrictions Weight Bearing Restrictions: No Pain: Pain Assessment Pain Scale: 0-10 Pain Score: 6  Pain Type: Acute pain Pain Location: Chest Pain Orientation: Left;Anterior Pain Radiating Towards: left arm Pain Descriptors / Indicators: Sharp;Throbbing Pain Onset: With Activity    Therapy/Group: Individual Therapy  Neysha Criado L Cj Edgell PT, DPT  09/15/2019, 10:26 AM

## 2019-09-15 NOTE — Progress Notes (Signed)
Occupational Therapy Session Note  Patient Details  Name: Brenda Arnold MRN: [id number] Date of Birth: 1956/09/08  Today's Date: 09/15/2019 OT Individual Time: [id number] Individual Time Calculation (min): 10 min  and Today's Date: 09/15/2019 OT Missed Time: 35 Minutes Missed Time Reason: Other (comment)(pt resting from earlier medical concern)   Short Term Goals: Week 1:  OT Short Term Goal 1 (Week 1): STG = LTGs due to ELOS  Skilled Therapeutic Interventions/Progress Updates:    Pt asleep in bed upon arrival but easily aroused.  Attempted to engage pt in OOB activity but pt states she just wants to rest before HD. Pt states she still feels a "slight twinge" in upper L chest.  See RN and PA note for earlier medical issue. Pt missed 35 mins skilled OT services.   Therapy Documentation Precautions:  Precautions Precautions: Fall Restrictions Weight Bearing Restrictions: No General: General OT Amount of Missed Time: 35 Minutes Pain: Pt states she is still feeling a "slight twinge" near L axilla; rest  Therapy/Group: Individual Therapy  [name] 09/15/2019, 11:55 AM

## 2019-09-16 ENCOUNTER — Inpatient Hospital Stay (HOSPITAL_COMMUNITY): Payer: BC Managed Care – PPO

## 2019-09-16 LAB — GLUCOSE, CAPILLARY
Glucose-Capillary: 106 mg/dL — ABNORMAL HIGH (ref 70–99)
Glucose-Capillary: 98 mg/dL (ref 70–99)
Glucose-Capillary: 88 mg/dL (ref 70–99)
Glucose-Capillary: 95 mg/dL (ref 70–99)

## 2019-09-16 MED ORDER — CHLORHEXIDINE GLUCONATE CLOTH 2 % EX PADS: 6.0000 | MEDICATED_PAD | Freq: Every day | CUTANEOUS | Status: DC

## 2019-09-16 NOTE — Progress Notes (Signed)
Occupational Therapy Session Note  Patient Details  Name: ELYSHA MUHA MRN: [id number] Date of Birth: 1956-11-21  Today's Date: 09/16/2019 OT Individual Time: 0700-0810 OT Individual Time Calculation (min): 70 min    Short Term Goals: Week 1:  OT Short Term Goal 1 (Week 1): STG = LTGs due to ELOS  Skilled Therapeutic Interventions/Progress Updates:    OT intervention with continued focus on BADL training to increase activity tolerance and safety awareness.  Pt amb with RW in room to bathroom and completed bathing at shower level and dressing with sit<>stand from bench.  Pt returns to room to complete lotion application and grooming tasks before donning socks seated EOB. Pt requires more than a reasonable amount of time to complete tasks and takes appropriate rest breaks. Pt amb with RW In hallway 158' X 2 with one rest break. Pt remained in w/c with all needs within reach and seat alarm activated.   Therapy Documentation Precautions:  Precautions Precautions: Fall Restrictions Weight Bearing Restrictions: No Pain:  Pt denies pain this morning.  Therapy/Group: Individual Therapy  [name] 09/16/2019, 9:15 AM

## 2019-09-16 NOTE — Progress Notes (Signed)
Occupational Therapy Session Note  Patient Details  Name: Brenda Arnold MRN: [id number] Date of Birth: 1956-04-27  Today's Date: 09/16/2019 OT Individual Time: 1115-1200 OT Individual Time Calculation (min): 45 min    Short Term Goals: Week 1:  OT Short Term Goal 1 (Week 1): STG = LTGs due to ELOS  Skilled Therapeutic Interventions/Progress Updates:    OT intervention with focus on functional amb with RW, dynamic standing balance, RW safety, activity tolerance, and safety awareness to increase independence with BADLs and IADLs. Pt amb 170' to ADL apartment. After a rest break, pt amb with RW to gym and engaged in simple tasks of retrieving items from floor and placing in linen bag.  Pt retrieves towels from floor safely and without assistance.  Pt engaged in balance tasks on [employer] - game of catch X 3 with increased proficiency with repetition. Pt returned to room (302') without rest break.  Pt remained in w/c with all needs within reach and seat alarm activated.   Therapy Documentation Precautions:  Precautions Precautions: Fall Restrictions Weight Bearing Restrictions: No   Pain:  Pt with no c/o pain   Therapy/Group: Individual Therapy  [name] 09/16/2019, 12:07 PM

## 2019-09-16 NOTE — Progress Notes (Signed)
[employer] PHYSICAL MEDICINE & REHABILITATION PROGRESS NOTE   Subjective/Complaints:   Pt reports walked again 158 ft x2 this AM- had shower- feeling good but frustrated about not getting breakfast this AM on time by dietary and frustrated with her diet- "nothing sounds good".  Says she is to d/c Saturday, since HD is Friday and so tired afterwards.  ROS: denies CP, SOB, N/V/D   Objective:   Dg Chest 2 View  Result Date: 09/15/2019 CLINICAL DATA:  Left-sided chest pain. Recent VATS with drainage of pericardial fluid. EXAM: CHEST - 2 VIEW COMPARISON:  09/10/2019 FINDINGS: Stable heart size. Small to moderate bilateral pleural effusions, left greater than right. Right-sided pleural effusion may be slightly larger compared to the prior chest x-ray. Smoothly contoured opacity along the lateral aspect of the right lower chest may relate to some loculated pleural fluid laterally. No edema, focal airspace disease or pneumothorax identified. IMPRESSION: Small to moderate bilateral pleural effusions, left greater than right. Right-sided pleural effusion may be slightly larger compared to the prior chest x-ray. Electronically Signed   By: Aletta Edouard M.D.   On: 09/15/2019 12:26   Recent Labs    09/14/19 0634 09/15/19 1252  WBC 8.7 10.3  HGB 9.0* 8.7*  HCT 29.1* 28.2*  PLT 269 294   Recent Labs    09/14/19 0634 09/15/19 1252  NA 136 137  K 4.1 4.1  CL 96* 95*  CO2 27 27  GLUCOSE 83 98  BUN 24* 40*  CREATININE 7.04* 9.48*  CALCIUM 8.7* 9.1    Intake/Output Summary (Last 24 hours) at 09/16/2019 1115 Last data filed at 09/16/2019 0902 Gross per 24 hour  Intake 360 ml  Output 2314 ml  Net -1954 ml     Physical Exam: Vital Signs Blood pressure (!) 91/38, pulse 70, temperature 98.5 F (36.9 C), temperature source Oral, resp. rate 17, weight 102.9 kg, SpO2 99 %.  Physical Exam  Nursing note, and vitals (BP soft)  reviewed. Constitutional: pt awake, alert, appropriate,  frustrated but trying to put a good face on it; dietary leaving; NAD HENT:  Head: Normocephalic and atraumatic.  CV: RRR - no M/R/G  Eyes:EOMI, no injected Neck: No tracheal deviation present. No thyromegaly present.  Respiratory: CTA B/L- no W/R/R- maybe slightly decreased at left base Right prior CT suture under right breast.    GI: abd protuberant, soft, NT, ND, (+)hypoactive BS  Musculoskeletal:        General: No edema.     Comments: Left wrist with graft - palpable  Neurological: She is alert and oriented to person, place, and time.  Motor: Grossly 5/5 throughout  Skin: Skin is warm and dry. No rash noted. No erythema.  Vascular changes b/l LE  Psychiatric: full, affect      Assessment/Plan: 1. Functional deficits secondary to debility due to pericarditis/pericardial tamponade which require 3+ hours per day of interdisciplinary therapy in a comprehensive inpatient rehab setting.  Physiatrist is providing close team supervision and 24 hour management of active medical problems listed below.  Physiatrist and rehab team continue to assess barriers to discharge/monitor patient progress toward functional and medical goals  Care Tool:  Bathing    Body parts bathed by patient: Right arm, Left arm, Chest, Abdomen, Front perineal area, Buttocks, Right upper leg, Left upper leg, Right lower leg, Left lower leg, Face         Bathing assist Assist Level: Supervision/Verbal cueing     Upper Body Dressing/Undressing Upper body dressing  What is the patient wearing?: Pull over shirt    Upper body assist Assist Level: Independent    Lower Body Dressing/Undressing Lower body dressing      What is the patient wearing?: Pants     Lower body assist Assist for lower body dressing: Independent with assitive device     Toileting Toileting    Toileting assist Assist for toileting: Supervision/Verbal cueing     Transfers Chair/bed transfer  Transfers assist      Chair/bed transfer assist level: Supervision/Verbal cueing Chair/bed transfer assistive device: Programmer, multimedia   Ambulation assist      Assist level: Supervision/Verbal cueing Assistive device: Walker-rolling Max distance: 150'   Walk 10 feet activity   Assist     Assist level: Supervision/Verbal cueing Assistive device: Walker-rolling   Walk 50 feet activity   Assist    Assist level: Supervision/Verbal cueing Assistive device: Walker-rolling    Walk 150 feet activity   Assist Walk 150 feet activity did not occur: Safety/medical concerns  Assist level: Supervision/Verbal cueing Assistive device: Walker-rolling    Walk 10 feet on uneven surface  activity   Assist     Assist level: Supervision/Verbal cueing Assistive device: Aeronautical engineer Will patient use wheelchair at discharge?: No Type of Wheelchair: Manual    Wheelchair assist level: Supervision/Verbal cueing Max wheelchair distance: 75 feet    Wheelchair 50 feet with 2 turns activity    Assist        Assist Level: Supervision/Verbal cueing   Wheelchair 150 feet activity     Assist          Blood pressure (!) 91/38, pulse 70, temperature 98.5 F (36.9 C), temperature source Oral, resp. rate 17, weight 102.9 kg, SpO2 99 %.  Medical Problem List and Plan: 1.  Debility secondary to pericarditis with pericardial tamponade.  CIR PT,  OT   10/13- walked 260 ft with 1 rest break with RW 2.  Antithrombotics: -DVT/anticoagulation:  Pharmaceutical: Other (comment)--Eliquis             -antiplatelet therapy: N/A 3. Left hip pain/Pain Management:  On gabapentin 100 mg/hs. Tylenol prn. Will add lidocaine patch for pain control.              Monitor with increased mobility 4. Mood: Very motivated to work hard and get home. LCSW to follow for evaluation and support.             -antipsychotic agents: N/A 5. Neuropsych: This patient is  capable of making decisions on her own behalf. 6. Skin/Wound Care: Routine pressure relief measures.  7. Fluids/Electrolytes/Nutrition: Strict I/O. 1200 cc FR/day. Labs with HP. 8. Pericarditis: On Colchicine MWF X 3 months. Monitor for agranulocytosis.   9. T2DM: Hgb A 1c- 4.9. Blood sugars tightly controlled on SSI.    CBG (last 3)  Recent Labs    09/15/19 1807 09/15/19 2128 09/16/19 0626  GLUCAP 74 114* 106*   Normoglycemic with normal A1C d/c SSI  Does not check CBG at home              Monitor with increased mobility 10. PAF: Now in NSR on amiodarone 200 mg bid--->taper to 200 mg daily starting 10/11.               Last EKG 10/3 Afib and RVR, per exam is regular rate nd rythmn on Eliquis for CVA prophyllaxis no repeat EKG needed at this time  11. Chronic hypotension: Baseline SBP 80-90's range on midodrine tid. Reports can drop to 50's with HD.              Monitor for signs and symptoms Ace wraps/teds/abdominal binder if necessary. 12.  ESRD: HD MWF at end of the day to help with tolerance of therapy.  13. Anemia of chronic disease: Being monitored. On weekly Aranesp.  Labs with HD. 14. Leucocytosis: resolved , unable to void and  difficult cath d/c UA C and S .  10/12- T 99.3 and WBC 10.8- will monitor CLOSELY  10/13- WBC resolved and no more low grade temps 15.  Morbidly obese: Encouraged weight loss   16. Dispo  10/15- d/c is Saturday for pt 10/17  -pt frustrated with diet- however I'm not sure Nephrology would allow me to change it.  LOS: 7 days A FACE TO FACE EVALUATION WAS PERFORMED  Oracio Galen 09/16/2019, 11:15 AM

## 2019-09-16 NOTE — Progress Notes (Signed)
Physical Therapy Session Note  Patient Details  Name: Brenda Arnold MRN: [id number] Date of Birth: Sep 24, 1956  Today's Date: 09/16/2019 PT Individual Time: 1345-1505 PT Individual Time Calculation (min): 80 min   Short Term Goals: Week 1:  PT Short Term Goal 1 (Week 1): = to LTGs based on ELOS  Skilled Therapeutic Interventions/Progress Updates:     Patient in bed upon PT arrival. Patient alert and agreeable to PT session. Patient denied pain throughout session.  Therapeutic Activity: Bed Mobility: Patient performed supine to/from sit independently in a flat bed without use of bed rails.  Transfers: Patient performed sit to/from stand x8 with supervision using a RW. Provided verbal cues for eccentric lowering with LEs to reduce forward trunk lean prior to sitting. Five times Sit to Stand Test (FTSS) Method: Use a straight back chair with a solid seat that is 16-18" high. Ask participant to sit on the chair with arms folded across their chest.   Instructions: "Stand up and sit down as quickly as possible 5 times, keeping your arms folded across your chest."   Measurement: Stop timing when the participant stands the 5th time.  TIME: __40:15____ (in seconds)  Times > 13.6 seconds is associated with increased disability and morbidity (Guralnik, 2000) Times > 15 seconds is predictive of recurrent falls in healthy individuals aged 56 and older (Buatois, et al., 2008) Normal performance values in community dwelling individuals aged 66 and older (Bohannon, 2006): o 60-69 years: 11.4 seconds o 70-79 years: 12.6 seconds o 80-89 years: 14.8 seconds  MCID: ? 2.3 seconds for Vestibular Disorders (Meretta, 2006)  Gait Training:  Patient ambulated 80 feet using RW with supervision following 6 min walk test, see details below. Ambulated with decreased gait speed, decreased step length, increased hip and knee flexion, forward trunk lean, and downward head gaze. Provided verbal cues for  erect posture and staying close to her RW for safety. 6 Minute Walk Test: 369 ft 2:00: RPE 5/10 2:42-3:37: stand rest break RPE 8/10 4:00: RPE 6/10 5:00-5:45: stand rest break RPE 6/10 6:00: RPE 8-9/10 HR 92, SPO2 98%  Patient went up/down 4 step with RW folded in L hand and holding R rail to simulate home set up with close supervision. Leading with the R ascending and L descending to to L hip weakness. Provided cues and demonstration for technique.   Therapeutic Exercise: Patient performed the following exercises from HEP in her walker bag with verbal and tactile cues for proper technique. -sit to stand x5 -B SLS 30 sec, single UE support on R and double UE support on L  -side stepping at counter 5 feet x5 on each side   Patient in bed at end of session with breaks locked, bed alarm set, and all needs within reach. Reported dizziness and pounding HR at end of session. Vitals BP 83/39, HR 81, SPO2 99%, RN made aware. Reinforced education on fluid intake for low BP, up to 1200 cc's/day. Patient reported drinking very little today.    Therapy Documentation Precautions:  Precautions Precautions: Fall Restrictions Weight Bearing Restrictions: No    Therapy/Group: Individual Therapy  Burhan Barham L Crescentia Boutwell PT, DPT  09/16/2019, 4:37 PM

## 2019-09-16 NOTE — Progress Notes (Signed)
[facility] ROUNDING NOTE   Subjective:   63 year old lady with history of diabetes end-stage renal disease Monday Wednesday Friday dialysis osteoarthritis both hips status post right total hip replacement and in need of left total hip replacement admitted 08/25/2019 with chest pain abnormal EKG found to be secondary to pericarditis.  CT scan of chest was negative for aneurysm pulmonary embolus.  Echocardiogram showed ejection fraction 65% with no pericardial effusion.  She is treated with colchicine.  She developed atrial fibrillation rapid heart rate started on amiodarone 08/07/2019.  She had worsening hypertension on 08/31/2019 was found to have a pericardial effusion with concerns of tamponade.  She was taken to the OR had a VATS procedure with right pericardial window by CT surgery.  Follow-up echo 09/03/2019 showed small pericardial effusion without evidence for tamponade with a left lateral pleural effusion status post pericardial tube which was discontinued on 09/04/2019.  Tolerated dialysis 09/15/2019 with 2 L ultrafiltered  Blood pressure 91/38 pulse 70 temperature 98.5 O2 sats 99% room air  Sodium 137 potassium 4.1 chloride 95 CO2 27 calcium 9.1 glucose 98 BUN 40 creatinine 9.48 phosphorus 3.0 albumin 2.9 WBC 10.3 hemoglobin 8.7 platelets 294  Amiodarone 200 mg daily apixaban 5 mg twice daily calcitriol 0.75 mcg Monday Wednesday Friday, colchicine 0.3 mg Monday Wednesday Friday darbepoetin 60 mcg last administered 09/10/2019, Neurontin 100 mg nightly, ProAmatine 10 mg 3 times daily, multivitamins 1 daily   Objective:  Vital signs in last 24 hours:  Temp:  [97.5 F (36.4 C)-98.5 F (36.9 C)] 98.5 F (36.9 C) (10/15 0533) Pulse Rate:  [64-89] 70 (10/15 0533) Resp:  [14-20] 17 (10/15 0533) BP: (83-113)/(33-69) 91/38 (10/15 0533) SpO2:  [96 %-100 %] 99 % (10/15 0533) Weight:  [102.9 kg-104.9 kg] 102.9 kg (10/15 0533)  Weight change: 1.6 kg Filed Weights   09/15/19 0436  09/15/19 1311 09/16/19 0533  Weight: 103.3 kg 104.9 kg 102.9 kg    Intake/Output: I/O last 3 completed shifts: In: 360 [P.O.:360] Out: 2314 [Other:2314]   Intake/Output this shift:  No intake/output data recorded.  General:NAD, Obese female, laying in bed Heart:RRR Lungs:CTAB Abdomen:soft, NTND Extremities:no LE edema Dialysis Access: LU AVF +b/t   Basic Metabolic Panel: Recent Labs  Lab 09/11/19 0532 09/13/19 1002 09/14/19 0634 09/15/19 1252  NA 135 135 136 137  K 4.2 4.3 4.1 4.1  CL 93* 93* 96* 95*  CO2 28 27 27 27   GLUCOSE 84 81 83 98  BUN 16 45* 24* 40*  CREATININE 6.13* 11.04* 7.04* 9.48*  CALCIUM 8.8* 9.2 8.7* 9.1  PHOS  --   --  1.8* 3.0    Liver Function Tests: Recent Labs  Lab 09/11/19 0532 09/13/19 1002 09/14/19 0634 09/15/19 1252  AST 26 20  --   --   ALT 16 16  --   --   ALKPHOS 108 122  --   --   BILITOT 0.8 0.6  --   --   PROT 6.1* 6.4*  --   --   ALBUMIN 2.7* 3.0* 2.8* 2.9*   No results for input(s): LIPASE, AMYLASE in the last 168 hours. No results for input(s): AMMONIA in the last 168 hours.  CBC: Recent Labs  Lab 09/11/19 0532 09/13/19 1002 09/14/19 0634 09/15/19 1252  WBC 10.4 10.8* 8.7 10.3  NEUTROABS 7.4 7.3 5.7  --   HGB 8.9* 9.6* 9.0* 8.7*  HCT 27.1* 30.0* 29.1* 28.2*  MCV 98.2 101.0* 101.4* 102.9*  PLT 239 299 269 294  Cardiac Enzymes: No results for input(s): CKTOTAL, CKMB, CKMBINDEX, TROPONINI in the last 168 hours.  BNP: Invalid input(s): POCBNP  CBG: Recent Labs  Lab 09/15/19 1006 09/15/19 1132 09/15/19 1807 09/15/19 2128 09/16/19 0626  GLUCAP 75 77 74 114* 106*    Microbiology: Results for orders placed or performed during the hospital encounter of 08/25/19  SARS CORONAVIRUS 2 (TAT 6-24 HRS) Nasopharyngeal Nasopharyngeal Swab     Status: None   Collection Time: 08/25/19 12:22 PM   Specimen: Nasopharyngeal Swab  Result Value Ref Range Status   SARS Coronavirus 2 NEGATIVE NEGATIVE Final     Comment: (NOTE) SARS-CoV-2 target nucleic acids are NOT DETECTED. The SARS-CoV-2 RNA is generally detectable in upper and lower respiratory specimens during the acute phase of infection. Negative results do not preclude SARS-CoV-2 infection, do not rule out co-infections with other pathogens, and should not be used as the sole basis for treatment or other patient management decisions. Negative results must be combined with clinical observations, patient history, and epidemiological information. The expected result is Negative. Fact Sheet for Patients: SugarRoll.be Fact Sheet for Healthcare Providers: https://www.woods-mathews.com/ This test is not yet approved or cleared by the [place] FDA and  has been authorized for detection and/or diagnosis of SARS-CoV-2 by FDA under an Emergency Use Authorization (EUA). This EUA will remain  in effect (meaning this test can be used) for the duration of the COVID-19 declaration under Section 56 4(b)(1) of the Act, 21 U.S.C. section 360bbb-3(b)(1), unless the authorization is terminated or revoked sooner. Performed at [facility], [place] [address], [place], [place] [address]   Surgical PCR screen     Status: None   Collection Time: 08/31/19 11:26 AM   Specimen: Nasal Swab  Result Value Ref Range Status   MRSA, PCR NEGATIVE NEGATIVE Final   Staphylococcus aureus NEGATIVE NEGATIVE Final    Comment: (NOTE) The Xpert SA Assay (FDA approved for NASAL specimens in patients 45 years of age and older), is one component of a comprehensive surveillance program. It is not intended to diagnose infection nor to guide or monitor treatment. Performed at [facility], [place] [address] [place], [place] [address]   Aerobic/Anaerobic Culture (surgical/deep wound)     Status: None   Collection Time: 08/31/19  2:17 PM   Specimen: PATH Cytology Misc. fluid; Body Fluid  Result Value Ref Range Status    Specimen Description PERICARDIAL  Final   Special Requests NONE  Final   Gram Stain   Final    MODERATE WBC PRESENT,BOTH PMN AND MONONUCLEAR NO ORGANISMS SEEN    Culture   Final    No growth aerobically or anaerobically. Performed at [facility], [place] [address], [place], [place] [address]    Report Status 09/05/2019 FINAL  Final  Aerobic/Anaerobic Culture (surgical/deep wound)     Status: None   Collection Time: 08/31/19  2:29 PM   Specimen: Pericardial; Tissue  Result Value Ref Range Status   Specimen Description PERICARDIAL  Final   Special Requests NONE  Final   Gram Stain   Final    FEW WBC PRESENT, PREDOMINANTLY MONONUCLEAR NO ORGANISMS SEEN    Culture   Final    No growth aerobically or anaerobically. Performed at [facility], [place] [address], [place], [place] [address]    Report Status 09/05/2019 FINAL  Final    Coagulation Studies: No results for input(s): LABPROT, INR in the last 72 hours.  Urinalysis: No results  for input(s): COLORURINE, LABSPEC, [place], GLUCOSEU, HGBUR, BILIRUBINUR, KETONESUR, PROTEINUR, UROBILINOGEN, NITRITE, LEUKOCYTESUR in the last 72 hours.  Invalid input(s): APPERANCEUR    Imaging: Dg Chest 2 View  Result Date: 09/15/2019 CLINICAL DATA:  Left-sided chest pain. Recent VATS with drainage of pericardial fluid. EXAM: CHEST - 2 VIEW COMPARISON:  09/10/2019 FINDINGS: Stable heart size. Small to moderate bilateral pleural effusions, left greater than right. Right-sided pleural effusion may be slightly larger compared to the prior chest x-ray. Smoothly contoured opacity along the lateral aspect of the right lower chest may relate to some loculated pleural fluid laterally. No edema, focal airspace disease or pneumothorax identified. IMPRESSION: Small to moderate bilateral pleural effusions, left greater than right. Right-sided pleural effusion may be slightly larger compared to the prior chest x-ray. Electronically Signed   By: Aletta Edouard M.D.   On: 09/15/2019 12:26     Medications:   . sodium chloride    . sodium chloride     . amiodarone  200 mg Oral Daily  . apixaban  5 mg Oral BID  . calcitRIOL  0.75 mcg Oral Q M,W,F-HD  . Chlorhexidine Gluconate Cloth  6 each Topical Q0600  . colchicine  0.3 mg Oral Q M,W,F-HD  . [START ON 09/17/2019] darbepoetin (ARANESP) injection - DIALYSIS  60 mcg Intravenous Q Fri-HD  . docusate sodium  100 mg Oral BID  . gabapentin  100 mg Oral QHS  . mouth rinse  15 mL Mouth Rinse BID  . midodrine  10 mg Oral TID WC  . multivitamin  1 tablet Oral QHS   sodium chloride, sodium chloride, acetaminophen, alteplase, bisacodyl, diphenhydrAMINE, guaiFENesin-dextromethorphan, heparin, lidocaine (PF), lidocaine-prilocaine, menthol-cetylpyridinium, Muscle Rub, pentafluoroprop-tetrafluoroeth, polyethylene glycol, prochlorperazine **OR** prochlorperazine **OR** prochlorperazine, senna-docusate, traMADol, traZODone  Assessment/ Plan:   ESRD-Monday Wednesday Friday dialysis.  Tolerated dialysis with 2.3 L removed next dialysis session is 09/17/2019  Debility continues on rehab  Pericardial effusion/tamponade EF 60 % was treated with pericardial window 08/31/2019 due to hypotension and symptomatic pericardial effusion appreciate assistance from cardiothoracic surgery.  Drain has been removed on 09/04/2019.  She continues on colchicine 0.36 mg Monday Wednesday Friday for period of 3 months.  Anemia stable 60 mcg last administered 09/10/2019 T sats 20%  Secondary hyperparathyroidism calcium appears to be at goal possibly a little high corrected for low albumin.  We will continue to follow.  Patient was receiving Parsabiv as an outpatient this is not an hospital formulary at this present time.  VELPHORO discontinued due to hypophosphatemia  Chronic hypertension continue midodrine 10 mg 3 times a day.  Left AV fistula aneurysms follow with Dr. Donnetta Hutching safe to use with no pending bleeding  NSVT  followed by cards  Atrial fibrillation followed by primary team anticoagulated rate controlled   LOS: [place] @TODAY @8 :18 AM

## 2019-09-16 NOTE — Progress Notes (Addendum)
Physical Therapy Discharge Summary  Patient Details  Name: Brenda Arnold MRN: [id number] Date of Birth: Sep 26, 1956  Today's Date: 09/17/2019 PT Individual Time: 0905-1005 PT Individual Time Calculation (min): 60 min    Patient has met 10 of 10 long term goals due to improved activity tolerance, improved balance, improved postural control, increased strength and ability to compensate for deficits.  Patient to discharge at an ambulatory level Modified Independent.   Patient's care partner unavailable to provide assistance at discharge as patient lives alone. Patient is at mod I level and does not require assistance at d/c.  Recommendation:  Patient will benefit from ongoing skilled PT services in outpatient setting to continue to advance safe functional mobility, address ongoing impairments in balance, strength, activity tolerance, functional mobility, and minimize fall risk.  Equipment: No equipment provided, patient has RW.  Reasons for discharge: treatment goals met  Patient/family agrees with progress made and goals achieved: Yes  Skilled Therapeutic Intervention Patient in w/c upon PT arrival. Patient alert and agreeable to PT session.  Therapeutic Activity: Bed Mobility: Patient performed rolling R/L and supine to/from sit mod I in the ADL bed .  Transfers: Patient performed stand pivot x2 and sit to/from stand x6, including furniture transfers, with and without a RW with mod I and a car transfer with mod I using the RW and pulling RW in/out the vehicle. Demonstrated safe technique with all transfers. PT demonstrated a floor transfer and educated on self assessment in the event of a fall, injuries and signs/symptoms that indicate calling emergency services, and fall prevention. Patient performed a floor transfer with supervision to get to the floor and mod A to return to sitting. Discussed difficulties with getting to hands and knees and determined to activate emergency services  in the event of a fall and to continue working on floor transfers with follow-up PT until safe to perform them on her own. Educated on having her cell phone with her at all times to activate emergency services. Patient able to teach back all education.  Gait Training:  Patient ambulated >150' feet and 25 feet throughout the ADL apartment using RW with mod I. Ambulated as below. Demonstrated safe technique and no signs of imbalance with ambulation without cues from therapist. She ambulated up/down a ramp, over unlevel surfaces, and up/down a 7" curb using a RW with mod I without cues from therapist and with safe technique.  Patient went up down 4 steps using a RW with mod I and 4 steps using B rails with mod I.   Patient in w/c at end of session with breaks locked and all needs within reach. Patient made independent in the room during this session, per discussion with Tom, OT, RN made aware and patient educated on wearing tennis shoes or non-skid socks when up in room. Patient in agreement.    PT Discharge Precautions/Restrictions Precautions Precautions: Fall Restrictions Weight Bearing Restrictions: No Vision/Perception  Perception Perception: Within Functional Limits Praxis Praxis: Intact  Cognition Overall Cognitive Status: Within Functional Limits for tasks assessed Arousal/Alertness: Awake/alert Orientation Level: Oriented X4 Attention: Focused;Sustained Focused Attention: Appears intact Sustained Attention: Appears intact Selective Attention: Appears intact Memory: Appears intact Immediate Memory Recall: Sock;Blue;Bed Memory Recall Sock: Without Cue Memory Recall Blue: Without Cue Memory Recall Bed: Without Cue Awareness: Appears intact Problem Solving: Appears intact Safety/Judgment: Appears intact Sensation Sensation Light Touch: Appears Intact Hot/Cold: Not tested Proprioception: Appears Intact Stereognosis: Not tested Coordination Gross Motor Movements are Fluid  and Coordinated: No Fine

## 2019-09-17 ENCOUNTER — Inpatient Hospital Stay (HOSPITAL_COMMUNITY): Payer: BC Managed Care – PPO

## 2019-09-17 ENCOUNTER — Ambulatory Visit: Payer: Self-pay | Admitting: Thoracic Surgery (Cardiothoracic Vascular Surgery)

## 2019-09-17 LAB — CBC WITH DIFFERENTIAL/PLATELET
Abs Immature Granulocytes: 0.02 10*3/uL (ref 0.00–0.07)
Basophils Absolute: 0 10*3/uL (ref 0.0–0.1)
Basophils Relative: 0 %
Eosinophils Absolute: 0.2 10*3/uL (ref 0.0–0.5)
Eosinophils Relative: 2 %
HCT: 30.5 % — ABNORMAL LOW (ref 36.0–46.0)
Hemoglobin: 9.4 g/dL — ABNORMAL LOW (ref 12.0–15.0)
Immature Granulocytes: 0 %
Lymphocytes Relative: 22 %
Lymphs Abs: 1.6 10*3/uL (ref 0.7–4.0)
MCH: 31.4 pg (ref 26.0–34.0)
MCHC: 30.8 g/dL (ref 30.0–36.0)
MCV: 102 fL — ABNORMAL HIGH (ref 80.0–100.0)
Monocytes Absolute: 1 10*3/uL (ref 0.1–1.0)
Monocytes Relative: 13 %
Neutro Abs: 4.5 10*3/uL (ref 1.7–7.7)
Neutrophils Relative %: 63 %
Platelets: 275 10*3/uL (ref 150–400)
RBC: 2.99 MIL/uL — ABNORMAL LOW (ref 3.87–5.11)
RDW: 17 % — ABNORMAL HIGH (ref 11.5–15.5)
WBC: 7.3 10*3/uL (ref 4.0–10.5)
nRBC: 0 % (ref 0.0–0.2)

## 2019-09-17 LAB — RENAL FUNCTION PANEL
Albumin: 3.2 g/dL — ABNORMAL LOW (ref 3.5–5.0)
Anion gap: 13 (ref 5–15)
BUN: 33 mg/dL — ABNORMAL HIGH (ref 8–23)
CO2: 26 mmol/L (ref 22–32)
Calcium: 9 mg/dL (ref 8.9–10.3)
Chloride: 96 mmol/L — ABNORMAL LOW (ref 98–111)
Creatinine, Ser: 8.43 mg/dL — ABNORMAL HIGH (ref 0.44–1.00)
GFR calc Af Amer: 5 mL/min — ABNORMAL LOW (ref >60)
GFR calc non Af Amer: 5 mL/min — ABNORMAL LOW (ref >60)
Glucose, Bld: 76 mg/dL (ref 70–99)
Phosphorus: 3.9 mg/dL (ref 2.5–4.6)
Potassium: 3.9 mmol/L (ref 3.5–5.1)
Sodium: 135 mmol/L (ref 135–145)

## 2019-09-17 LAB — GLUCOSE, CAPILLARY
Glucose-Capillary: 89 mg/dL (ref 70–99)
Glucose-Capillary: 82 mg/dL (ref 70–99)

## 2019-09-17 MED ORDER — DOCUSATE SODIUM 100 MG PO CAPS: 100.0000 mg | ORAL_CAPSULE | Freq: Two times a day (BID) | ORAL | 0 refills | Status: DC

## 2019-09-17 MED ORDER — MUSCLE RUB 10-15 % EX CREA: 1.0000 "application " | TOPICAL_CREAM | CUTANEOUS | 0 refills | Status: DC | PRN

## 2019-09-17 MED ORDER — HEPARIN SODIUM (PORCINE) 1000 UNIT/ML DIALYSIS: 1000.0000 [IU] | INTRAMUSCULAR | Status: DC | PRN

## 2019-09-17 MED ORDER — CALCITRIOL 0.25 MCG PO CAPS
ORAL_CAPSULE | ORAL | Status: AC
  Administered 2019-09-17: 18:00:00 0.75 ug via ORAL
  Filled 2019-09-17: qty 1

## 2019-09-17 MED ORDER — LIDOCAINE HCL (PF) 1 % IJ SOLN: 5.0000 mL | INTRAMUSCULAR | Status: DC | PRN

## 2019-09-17 MED ORDER — DARBEPOETIN ALFA 60 MCG/0.3ML IJ SOSY
PREFILLED_SYRINGE | INTRAMUSCULAR | Status: AC
  Administered 2019-09-17: 18:00:00 60 ug via INTRAVENOUS
  Filled 2019-09-17: qty 0.3

## 2019-09-17 MED ORDER — CALCITRIOL 0.5 MCG PO CAPS
ORAL_CAPSULE | ORAL | Status: AC
  Filled 2019-09-17: qty 1

## 2019-09-17 MED ORDER — ALTEPLASE 2 MG IJ SOLR: 2.0000 mg | Freq: Once | INTRAMUSCULAR | Status: DC | PRN

## 2019-09-17 MED ORDER — SODIUM CHLORIDE 0.9 % IV SOLN: 100.0000 mL | INTRAVENOUS | Status: DC | PRN

## 2019-09-17 MED ORDER — LIDOCAINE-PRILOCAINE 2.5-2.5 % EX CREA: 1.0000 "application " | TOPICAL_CREAM | CUTANEOUS | Status: DC | PRN

## 2019-09-17 MED ORDER — RENA-VITE PO TABS: 1.0000 | ORAL_TABLET | Freq: Every day | ORAL | 0 refills | Status: DC

## 2019-09-17 MED ORDER — COLCHICINE 0.6 MG PO TABS: 0.3000 mg | ORAL_TABLET | ORAL | 0 refills | Status: DC

## 2019-09-17 MED ORDER — CALCITRIOL 0.25 MCG PO CAPS: 0.7500 ug | ORAL_CAPSULE | ORAL | Status: DC

## 2019-09-17 MED ORDER — PENTAFLUOROPROP-TETRAFLUOROETH EX AERO: 1.0000 "application " | INHALATION_SPRAY | CUTANEOUS | Status: DC | PRN

## 2019-09-17 MED ORDER — AMIODARONE HCL 200 MG PO TABS: 200.0000 mg | ORAL_TABLET | Freq: Every day | ORAL | 0 refills | Status: DC

## 2019-09-17 NOTE — Progress Notes (Signed)
Occupational Therapy Discharge Summary  Patient Details  Name: ELANE PEABODY MRN: [id number] Date of Birth: Oct 03, 1956  Patient has met 11 of 11 long term goals due to improved activity tolerance, improved balance, ability to compensate for deficits and improved awareness.  Pt made excellent progress with BADLs and IADLs during this admission.  Pt is mod I/I with all BADLs and IADLs including simple meal prep, light housekeeping, and laundry tasks.  Pt continues to fatigue easily but takes rest breaks appropriately. Pt pleased with progress and ready for discharge.  Pt has not family or friends staying with her but will have church members checking in with her after discharge. Patient to discharge at overall Modified Independent level.   Recommendation:  Patient will not require follow up OT at this time.  Equipment: No equipment provided pt already owns TTB  Reasons for discharge: treatment goals met and discharge from hospital  Patient/family agrees with progress made and goals achieved: Yes  OT Discharge Vision Baseline Vision/History: Wears glasses Wears Glasses: At all times Patient Visual Report: No change from baseline Vision Assessment?: No apparent visual deficits Perception  Perception: Within Functional Limits Praxis Praxis: Intact Cognition Overall Cognitive Status: Within Functional Limits for tasks assessed Arousal/Alertness: Awake/alert Orientation Level: Oriented X4 Attention: Focused;Sustained Focused Attention: Appears intact Sustained Attention: Appears intact Selective Attention: Appears intact Memory: Appears intact Immediate Memory Recall: Sock;Blue;Bed Memory Recall Sock: Without Cue Memory Recall Blue: Without Cue Memory Recall Bed: Without Cue Awareness: Appears intact Problem Solving: Appears intact Safety/Judgment: Appears intact Sensation Sensation Light Touch: Appears Intact Hot/Cold: Appears Intact Proprioception: Appears  Intact Stereognosis: Not tested Coordination Gross Motor Movements are Fluid and Coordinated: Yes Fine Motor Movements are Fluid and Coordinated: Yes Motor  Motor Motor: Other (comment) Motor - Skilled Clinical Observations: impaired endurance with overall deconditioning Trunk/Postural Assessment  Cervical Assessment Cervical Assessment: Within Functional Limits Thoracic Assessment Thoracic Assessment: Within Functional Limits Lumbar Assessment Lumbar Assessment: Exceptions to WFL(posterior pelvic tilt) Postural Control Postural Limitations: decreased as pt uses UE support on RW for standing balance  Balance Static Sitting Balance Static Sitting - Balance Support: Feet supported Static Sitting - Level of Assistance: 7: Independent Dynamic Sitting Balance Dynamic Sitting - Balance Support: During functional activity Dynamic Sitting - Level of Assistance: 7: Independent Extremity/Trunk Assessment RUE Assessment RUE Assessment: Within Functional Limits LUE Assessment LUE Assessment: Within Functional Limits   [name] 09/17/2019, 6:33 AM

## 2019-09-17 NOTE — Progress Notes (Addendum)
Social Work Discharge Note   The overall goal for the admission was met for: DC SAT 10/17  Discharge location: Yes-HOME WITH INTERMITTENT ASSIST [facility] FRIENDS  Length of Stay: Yes-9 DAYS  Discharge activity level: Yes-MOD/I LEVEL  Home/community participation: Yes  Services provided included: MD, RD, PT, OT, RN, CM, Pharmacy and SW  Financial Services: Private Insurance: [employer]  Follow-up services arranged: Outpatient: [facility]-PT WILL CALL TO SET UP APPIONT[facility]IAC REH[facility] NEEDS REFERRAL BY CARDILOGIST  Comments (or additional information):PT DID WELL AND REACHED MOD/I LEVEL SO MOM DID NOT HAVE TO COME UP FROM [facility] TO ASSIST. SHE PLANS TO GO BACK TO WORK IN 5 DAYS-MD RECOMMENDATION AND WILL GO TO OP WHETHER [facility]. HAS ALL EQUIPMENT. FMLA/LOA PAPERWORK FAXED IN AND ORIGINALS GIVEN BACK TO PT.  Patient/Family verbalized understanding of follow-up arrangements: Yes  Individual responsible for coordination of the follow-up plan: SELF  Confirmed correct DME delivered: Elease Hashimoto 09/17/2019    Elease Hashimoto

## 2019-09-17 NOTE — Progress Notes (Signed)
[facility] PHYSICAL MEDICINE & REHABILITATION PROGRESS NOTE   Subjective/Complaints:   Pt reports was up walking after shower again this AM- getting a lot less winded.  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  Asking for FMLA paperwork for hospitalization (of course) and a few days after gets home.   ROS: denies CP, SOB, N/V/D   Objective:   No results found. Recent Labs    09/15/19 1252 09/17/19 0521  WBC 10.3 7.3  HGB 8.7* 9.4*  HCT 28.2* 30.5*  PLT 294 275   Recent Labs    09/15/19 1252 09/17/19 0521  NA 137 135  K 4.1 3.9  CL 95* 96*  CO2 27 26  GLUCOSE 98 76  BUN 40* 33*  CREATININE 9.48* 8.43*  CALCIUM 9.1 9.0    Intake/Output Summary (Last 24 hours) at 09/17/2019 1310 Last data filed at 09/17/2019 0730 Gross per 24 hour  Intake 360 ml  Output -  Net 360 ml     Physical Exam: Vital Signs Blood pressure (!) 107/49, pulse 73, temperature 98.2 F (36.8 C), temperature source Oral, resp. rate 18, weight 102.9 kg, SpO2 100 %.  Physical Exam  Nursing note, and vitals (BP soft)  reviewed. Constitutional: pt awake, alert, appropriate, brighter affect, sitting in chair next to bedside, NAD HENT:  Head: Normocephalic and atraumatic.  CV: RRR - no M/R/G  Eyes:EOMI, not injected; conjugate gaze Neck: No tracheal deviation present. No thyromegaly present.  Respiratory: CTA B/L- no W/R/R- maybe slightly decreased at left base still Right prior CT suture under right breast.    GI: abd protuberant, soft, NT, ND, (+)hypoactive BS  Musculoskeletal:        General: No edema.     Comments: Left wrist with graft - palpable  Neurological: She is alert and oriented to person, place, and time.  Motor: Grossly 5/5 throughout  Skin: Skin is warm and dry. No rash noted. No erythema.  Vascular changes b/l LE  Psychiatric: full, affect      Assessment/Plan: 1.  Functional deficits secondary to debility due to pericarditis/pericardial tamponade which require 3+ hours per day of interdisciplinary therapy in a comprehensive inpatient rehab setting.  Physiatrist is providing close team supervision and 24 hour management of active medical problems listed below.  Physiatrist and rehab team continue to assess barriers to discharge/monitor patient progress toward functional and medical goals  Care Tool:  Bathing    Body parts bathed by patient: Right arm, Left arm, Chest, Abdomen, Front perineal area, Buttocks, Right upper leg, Left upper leg, Right lower leg, Left lower leg, Face         Bathing assist Assist Level: Independent with assistive device     Upper Body Dressing/Undressing Upper body dressing   What is the patient wearing?: Pull over shirt    Upper body assist Assist Level: Independent    Lower Body Dressing/Undressing Lower body dressing      What is the patient wearing?: Pants     Lower body assist Assist for lower body dressing: Independent with assitive device     Toileting Toileting    Toileting assist Assist for toileting: Independent with assistive device     Transfers Chair/bed transfer  Transfers assist     Chair/bed transfer assist level: Independent with assistive device Chair/bed transfer assistive device: Emergency planning/management officer  level: Independent with assistive device Assistive device: Walker-rolling Max distance: >150'   Walk 10 feet activity   Assist     Assist level: Independent with assistive device Assistive device: Walker-rolling   Walk 50 feet activity   Assist    Assist level: Independent with assistive device Assistive device: Walker-rolling    Walk 150 feet activity   Assist Walk 150 feet activity did not occur: Safety/medical concerns  Assist level: Independent with assistive device Assistive device: Walker-rolling     Walk 10 feet on uneven surface  activity   Assist     Assist level: Independent with assistive device Assistive device: Aeronautical engineer Will patient use wheelchair at discharge?: No(Patient is a functional ambulator) Type of Wheelchair: Educational psychologist activity did not occur: N/A  Wheelchair assist level: Supervision/Verbal cueing Max wheelchair distance: 75 feet    Wheelchair 50 feet with 2 turns activity    Assist    Wheelchair 50 feet with 2 turns activity did not occur: N/A   Assist Level: Supervision/Verbal cueing   Wheelchair 150 feet activity     Assist  Wheelchair 150 feet activity did not occur: N/A       Blood pressure (!) 107/49, pulse 73, temperature 98.2 F (36.8 C), temperature source Oral, resp. rate 18, weight 102.9 kg, SpO2 100 %.  Medical Problem List and Plan: 1.  Debility secondary to pericarditis with pericardial tamponade.  CIR PT,  OT   10/13- walked 260 ft with 1 rest break with RW 2.  Antithrombotics: -DVT/anticoagulation:  Pharmaceutical: Other (comment)--Eliquis             -antiplatelet therapy: N/A 3. Left hip pain/Pain Management:  On gabapentin 100 mg/hs. Tylenol prn. Will add lidocaine patch for pain control.              Monitor with increased mobility 4. Mood: Very motivated to work hard and get home. LCSW to follow for evaluation and support.             -antipsychotic agents: N/A 5. Neuropsych: This patient is capable of making decisions on her own behalf. 6. Skin/Wound Care: Routine pressure relief measures.  7. Fluids/Electrolytes/Nutrition: Strict I/O. 1200 cc FR/day. Labs with HP.  10/16- renal function panel looks good; Hb is improving. 8. Pericarditis: On Colchicine MWF X 3 months. Monitor for agranulocytosis.   9. T2DM: Hgb A 1c- 4.9. Blood sugars tightly controlled on SSI.    CBG (last 3)  Recent Labs    09/16/19 2134 09/17/19 0611 09/17/19 1158  GLUCAP 95 89 82    Normoglycemic with normal A1C d/c SSI  Does not check CBG at home              Monitor with increased mobility 10. PAF: Now in NSR on amiodarone 200 mg bid--->taper to 200 mg daily starting 10/11.               Last EKG 10/3 Afib and RVR, per exam is regular rate nd rythmn on Eliquis for CVA prophyllaxis no repeat EKG needed at this time  11. Chronic hypotension: Baseline SBP 80-90's range on midodrine tid. Reports can drop to 50's with HD.              Monitor for signs and symptoms Ace wraps/teds/abdominal binder if necessary. 12.  ESRD: HD MWF at end of the day to help with tolerance of therapy.  13. Anemia of chronic disease: Being monitored. On  weekly Aranesp.  Labs with HD. 14. Leucocytosis: resolved , unable to void and  difficult cath d/c UA C and S .  10/12- T 99.3 and WBC 10.8- will monitor CLOSELY  10/13- WBC resolved and no more low grade temps 15.  Morbidly obese: Encouraged weight loss   16. Dispo  10/15- d/c is Saturday for pt 10/17  -pt frustrated with diet- however I'm not sure Nephrology would allow me to change it.  10/16- d/c tomorrow- Pam and I dealt with FMLA paperwork.  LOS: 8 days A FACE TO FACE EVALUATION WAS PERFORMED  Omesha Bowerman 09/17/2019, 1:10 PM

## 2019-09-17 NOTE — Progress Notes (Signed)
[facility] ROUNDING NOTE   Subjective:   63 year old lady with history of diabetes end-stage renal disease Monday Wednesday Friday dialysis osteoarthritis both hips status post right total hip replacement and in need of left total hip replacement admitted 08/25/2019 with chest pain abnormal EKG found to be secondary to pericarditis.  CT scan of chest was negative for aneurysm pulmonary embolus.  Echocardiogram showed ejection fraction 65% with no pericardial effusion.  She is treated with colchicine.  She developed atrial fibrillation rapid heart rate started on amiodarone 08/07/2019.  She had worsening hypertension on 08/31/2019 was found to have a pericardial effusion with concerns of tamponade.  She was taken to the OR had a VATS procedure with right pericardial window by CT surgery.  Follow-up echo 09/03/2019 showed small pericardial effusion without evidence for tamponade with a left lateral pleural effusion status post pericardial tube which was discontinued on 09/04/2019.  Tolerated dialysis 09/15/2019 with 2 L ultrafiltered.  Next dialysis planned 01/17/2019  Blood pressure 101/50 pulse 68 temperature 98.8 O2 sats 100% room air  Sodium 135 potassium 3.9 chloride 96 CO2 26 BUN 33 creatinine 8.43 glucose 76 calcium 9 albumin 3.2 phosphorus 3.9 WBC 7.3 hemoglobin 9.4 platelets 275  Amiodarone 200 mg daily apixaban 5 mg twice daily calcitriol 0.75 mcg Monday Wednesday Friday, colchicine 0.3 mg Monday Wednesday Friday darbepoetin 60 mcg last administered 09/10/2019, Neurontin 100 mg nightly, ProAmatine 10 mg 3 times daily, multivitamins 1 daily   Objective:  Vital signs in last 24 hours:  Temp:  [97.9 F (36.6 C)-98.8 F (37.1 C)] 98.8 F (37.1 C) (10/16 0545) Pulse Rate:  [65-79] 68 (10/16 0545) Resp:  [16-20] 16 (10/16 0545) BP: (78-101)/(39-53) 101/50 (10/16 0545) SpO2:  [97 %-100 %] 100 % (10/16 0545)  Weight change:  Filed Weights   09/15/19 0436 09/15/19 1311 09/16/19  0533  Weight: 103.3 kg 104.9 kg 102.9 kg    Intake/Output: I/O last 3 completed shifts: In: 480 [P.O.:480] Out: -    Intake/Output this shift:  Total I/O In: 120 [P.O.:120] Out: -   General:NAD, Obese female, laying in bed Heart:RRR Lungs:CTAB Abdomen:soft, NTND Extremities:no LE edema Dialysis Access: LU AVF +b/t   Basic Metabolic Panel: Recent Labs  Lab 09/11/19 0532 09/13/19 1002 09/14/19 0634 09/15/19 1252 09/17/19 0521  NA 135 135 136 137 135  K 4.2 4.3 4.1 4.1 3.9  CL 93* 93* 96* 95* 96*  CO2 28 27 27 27 26   GLUCOSE 84 81 83 98 76  BUN 16 45* 24* 40* 33*  CREATININE 6.13* 11.04* 7.04* 9.48* 8.43*  CALCIUM 8.8* 9.2 8.7* 9.1 9.0  PHOS  --   --  1.8* 3.0 3.9    Liver Function Tests: Recent Labs  Lab 09/11/19 0532 09/13/19 1002 09/14/19 0634 09/15/19 1252 09/17/19 0521  AST 26 20  --   --   --   ALT 16 16  --   --   --   ALKPHOS 108 122  --   --   --   BILITOT 0.8 0.6  --   --   --   PROT 6.1* 6.4*  --   --   --   ALBUMIN 2.7* 3.0* 2.8* 2.9* 3.2*   No results for input(s): LIPASE, AMYLASE in the last 168 hours. No results for input(s): AMMONIA in the last 168 hours.  CBC: Recent Labs  Lab 09/11/19 0532 09/13/19 1002 09/14/19 0634 09/15/19 1252 09/17/19 0521  WBC 10.4 10.8* 8.7 10.3 7.3  NEUTROABS 7.4  7.3 5.7  --  4.5  HGB 8.9* 9.6* 9.0* 8.7* 9.4*  HCT 27.1* 30.0* 29.1* 28.2* 30.5*  MCV 98.2 101.0* 101.4* 102.9* 102.0*  PLT 239 299 269 294 275    Cardiac Enzymes: No results for input(s): CKTOTAL, CKMB, CKMBINDEX, TROPONINI in the last 168 hours.  BNP: Invalid input(s): POCBNP  CBG: Recent Labs  Lab 09/16/19 0626 09/16/19 1201 09/16/19 1632 09/16/19 2134 09/17/19 0611  GLUCAP 106* 98 88 95 89    Microbiology: Results for orders placed or performed during the hospital encounter of 08/25/19  SARS CORONAVIRUS 2 (TAT 6-24 HRS) Nasopharyngeal Nasopharyngeal Swab     Status: None   Collection Time: 08/25/19 12:22 PM   Specimen:  Nasopharyngeal Swab  Result Value Ref Range Status   SARS Coronavirus 2 NEGATIVE NEGATIVE Final    Comment: (NOTE) SARS-CoV-2 target nucleic acids are NOT DETECTED. The SARS-CoV-2 RNA is generally detectable in upper and lower respiratory specimens during the acute phase of infection. Negative results do not preclude SARS-CoV-2 infection, do not rule out co-infections with other pathogens, and should not be used as the sole basis for treatment or other patient management decisions. Negative results must be combined with clinical observations, patient history, and epidemiological information. The expected result is Negative. Fact Sheet for Patients: SugarRoll.be Fact Sheet for Healthcare Providers: https://www.woods-mathews.com/ This test is not yet approved or cleared by the [place] FDA and  has been authorized for detection and/or diagnosis of SARS-CoV-2 by FDA under an Emergency Use Authorization (EUA). This EUA will remain  in effect (meaning this test can be used) for the duration of the COVID-19 declaration under Section 56 4(b)(1) of the Act, 21 U.S.C. section 360bbb-3(b)(1), unless the authorization is terminated or revoked sooner. Performed at [facility], [place] [address], [place], [place] [address]   Surgical PCR screen     Status: None   Collection Time: 08/31/19 11:26 AM   Specimen: Nasal Swab  Result Value Ref Range Status   MRSA, PCR NEGATIVE NEGATIVE Final   Staphylococcus aureus NEGATIVE NEGATIVE Final    Comment: (NOTE) The Xpert SA Assay (FDA approved for NASAL specimens in patients 72 years of age and older), is one component of a comprehensive surveillance program. It is not intended to diagnose infection nor to guide or monitor treatment. Performed at [facility], [place] [address] [place], [place] [address]   Aerobic/Anaerobic Culture (surgical/deep wound)     Status: None   Collection Time:  08/31/19  2:17 PM   Specimen: PATH Cytology Misc. fluid; Body Fluid  Result Value Ref Range Status   Specimen Description PERICARDIAL  Final   Special Requests NONE  Final   Gram Stain   Final    MODERATE WBC PRESENT,BOTH PMN AND MONONUCLEAR NO ORGANISMS SEEN    Culture   Final    No growth aerobically or anaerobically. Performed at [facility], [place] [address] [place], [place] [address]    Report Status 09/05/2019 FINAL  Final  Aerobic/Anaerobic Culture (surgical/deep wound)     Status: None   Collection Time: 08/31/19  2:29 PM   Specimen: Pericardial; Tissue  Result Value Ref Range Status   Specimen Description PERICARDIAL  Final   Special Requests NONE  Final   Gram Stain   Final    FEW WBC PRESENT, PREDOMINANTLY MONONUCLEAR NO ORGANISMS SEEN    Culture   Final    No growth aerobically or anaerobically. Performed at [facility], [address] [place], [place] [address]    Report Status 09/05/2019 FINAL  Final    Coagulation Studies: No results for input(s): LABPROT, INR in the last 72 hours.  Urinalysis: No results for input(s): COLORURINE, LABSPEC, PHURINE, GLUCOSEU, HGBUR, BILIRUBINUR, KETONESUR, PROTEINUR, UROBILINOGEN, NITRITE, LEUKOCYTESUR in the last 72 hours.  Invalid input(s): APPERANCEUR    Imaging: Dg Chest 2 View  Result Date: 09/15/2019 CLINICAL DATA:  Left-sided chest pain. Recent VATS with drainage of pericardial fluid. EXAM: CHEST - 2 VIEW COMPARISON:  09/10/2019 FINDINGS: Stable heart size. Small to moderate bilateral pleural effusions, left greater than right. Right-sided pleural effusion may be slightly larger compared to the prior chest x-ray. Smoothly contoured opacity along the lateral aspect of the right lower chest may relate to some loculated pleural fluid laterally. No edema, focal airspace disease or pneumothorax identified. IMPRESSION: Small to moderate bilateral pleural effusions, left greater than right. Right-sided pleural  effusion may be slightly larger compared to the prior chest x-ray. Electronically Signed   By: Aletta Edouard M.D.   On: 09/15/2019 12:26     Medications:   . sodium chloride    . sodium chloride     . amiodarone  200 mg Oral Daily  . apixaban  5 mg Oral BID  . calcitRIOL  0.75 mcg Oral Q M,W,F-HD  . Chlorhexidine Gluconate Cloth  6 each Topical Q0600  . Chlorhexidine Gluconate Cloth  6 each Topical Q0600  . colchicine  0.3 mg Oral Q M,W,F-HD  . darbepoetin (ARANESP) injection - DIALYSIS  60 mcg Intravenous Q Fri-HD  . docusate sodium  100 mg Oral BID  . gabapentin  100 mg Oral QHS  . mouth rinse  15 mL Mouth Rinse BID  . midodrine  10 mg Oral TID WC  . multivitamin  1 tablet Oral QHS   sodium chloride, sodium chloride, acetaminophen, alteplase, bisacodyl, diphenhydrAMINE, guaiFENesin-dextromethorphan, heparin, lidocaine (PF), lidocaine-prilocaine, menthol-cetylpyridinium, Muscle Rub, pentafluoroprop-tetrafluoroeth, polyethylene glycol, prochlorperazine **OR** prochlorperazine **OR** prochlorperazine, senna-docusate, traMADol, traZODone  Assessment/ Plan:   ESRD-Monday Wednesday Friday dialysis.  Tolerated dialysis with 2.3 L removed next dialysis session is 09/17/2019  Debility continues on rehab  Pericardial effusion/tamponade EF 60 % was treated with pericardial window 08/31/2019 due to hypotension and symptomatic pericardial effusion appreciate assistance from cardiothoracic surgery.  Drain has been removed on 09/04/2019.  She continues on colchicine 0.36 mg Monday Wednesday Friday for period of 3 months.  Anemia stable 60 mcg last administered 09/10/2019 T sats 20%  Secondary hyperparathyroidism calcium appears to be at goal possibly a little high corrected for low albumin.  We will continue to follow.  Patient was receiving Parsabiv as an outpatient this is not an hospital formulary at this present time.  VELPHORO discontinued due to hypophosphatemia  Chronic hypertension  continue midodrine 10 mg 3 times a day.  Left AV fistula aneurysms follow with Dr. Donnetta Hutching safe to use with no pending bleeding  NSVT followed by cards  Atrial fibrillation followed by primary team anticoagulated rate controlled   LOS: [place] @TODAY @9 :53 AM

## 2019-09-17 NOTE — Progress Notes (Signed)
Occupational Therapy Session Note  Patient Details  Name: Brenda Arnold MRN: [id number] Date of Birth: Dec 11, 1955  Today's Date: 09/17/2019 OT Individual Time: [id number] Individual Time Calculation (min): 44 min    Short Term Goals: Week 1:  OT Short Term Goal 1 (Week 1): STG = LTGs due to ELOS  Skilled Therapeutic Interventions/Progress Updates:    OT intervention with focus on functional amb with RW, standing balance, ongoing discharge planning, activity tolerance, and safety awareness.  Pt amb with RW from her room to ADL apartment to rest before continuing on to gym.  Pt engaged in therapeutic tasks standing on Airex-peg board task and tossing bean bags.  Pt required to retrieve bean bag with LUE and transferring to RUE before tossing at target.  Pt amb with RW to retrieve bags from floor.  Pt required rest breaks between tasks.  Pt returned to room without rest break.  Pt remained in w/c with all needs within reach. Pt independent in room with RW.   Therapy Documentation Precautions:  Precautions Precautions: Fall Restrictions Weight Bearing Restrictions: No  Pain:  Pt denies pain   Therapy/Group: Individual Therapy  [name] 09/17/2019, 12:00 PM

## 2019-09-17 NOTE — Progress Notes (Signed)
Occupational Therapy Session Note  Patient Details  Name: Brenda Arnold MRN: [id number] Date of Birth: Jul 05, 1956  Today's Date: 09/17/2019 OT Individual Time: 0700-0810 OT Individual Time Calculation (min): 70 min    Short Term Goals: Week 1:  OT Short Term Goal 1 (Week 1): STG = LTGs due to ELOS  Skilled Therapeutic Interventions/Progress Updates:     Pt resting in bed upon arrival.  Pt engaged in bathing/dressing tasks at shower level this morning.  Pt amb with RW in room to gather supplies before entering room and completed all tasks with mod I. Pt takes appropriate rest breaks throughout session.  Pt returns to bed to complete LB dressing tasks.  Pt completes grooming tasks seated in chair at sink (similar to home setup). Pt amb with RW in room to nursing station and takes a standing rest break before returning to room.  Discussed with pt that after her AM PT session, she would be made independent in room including taking a shower and getting ready for discharge tomorrow.  Pt please with progress.  Pt remained in w/c with all needs within reach and seat alarm activated.   Therapy Documentation Precautions:  Precautions Precautions: Fall Restrictions Weight Bearing Restrictions: No Pain:  Pt denies pain this morning   Therapy/Group: Individual Therapy  [name] 09/17/2019, 8:13 AM

## 2019-09-18 DIAGNOSIS — D638 Anemia in other chronic diseases classified elsewhere: Secondary | ICD-10-CM

## 2019-09-18 DIAGNOSIS — I951 Orthostatic hypotension: Secondary | ICD-10-CM

## 2019-09-18 DIAGNOSIS — Z992 Dependence on renal dialysis: Secondary | ICD-10-CM

## 2019-09-18 DIAGNOSIS — E118 Type 2 diabetes mellitus with unspecified complications: Secondary | ICD-10-CM

## 2019-09-18 DIAGNOSIS — N186 End stage renal disease: Secondary | ICD-10-CM

## 2019-09-18 LAB — GLUCOSE, CAPILLARY
Glucose-Capillary: 76 mg/dL (ref 70–99)
Glucose-Capillary: 91 mg/dL (ref 70–99)

## 2019-09-18 NOTE — Progress Notes (Signed)
Patient discharged to home per wheelchair accompanied by NT and friend ; discharge instructions received yesterday. Complaint of shoulder pain left  Side but responded to pain med, hot therapy and muscle rub. No further questions noted.

## 2019-09-18 NOTE — Progress Notes (Signed)
[facility] PHYSICAL MEDICINE & REHABILITATION PROGRESS NOTE   Subjective/Complaints: Patient seen laying in bed this morning.  She states she slept well overnight, but has a spasm in her shoulder this morning, which is improving no.  She is ready for discharge.  ROS: Denies CP, SOB, N/V/D   Objective:   No results found. Recent Labs    09/15/19 1252 09/17/19 0521  WBC 10.3 7.3  HGB 8.7* 9.4*  HCT 28.2* 30.5*  PLT 294 275   Recent Labs    09/15/19 1252 09/17/19 0521  NA 137 135  K 4.1 3.9  CL 95* 96*  CO2 27 26  GLUCOSE 98 76  BUN 40* 33*  CREATININE 9.48* 8.43*  CALCIUM 9.1 9.0    Intake/Output Summary (Last 24 hours) at 09/18/2019 1022 Last data filed at 09/17/2019 1802 Gross per 24 hour  Intake 120 ml  Output 2400 ml  Net -2280 ml     Physical Exam: Vital Signs Blood pressure (!) 93/55, pulse 75, temperature 98.1 F (36.7 C), temperature source Oral, resp. rate 18, weight 101.3 kg, SpO2 100 %. Constitutional: No distress . Vital signs reviewed.  Morbidly obese. HENT: Normocephalic.  Atraumatic. Eyes: EOMI. No discharge. Cardiovascular: No JVD. Respiratory: Normal effort.  No stridor. GI: Non-distended. Skin: Vascular changes bilateral lower extremities Psych: Normal mood.  Normal behavior. Musc: No edema in extremities.  No tenderness in extremities.\ Neurological: Alert Motor: Grossly 5/5 throughout , unchanged  Assessment/Plan: 1. Functional deficits secondary to debility due to pericarditis/pericardial tamponade which require 3+ hours per day of interdisciplinary therapy in a comprehensive inpatient rehab setting.  Physiatrist is providing close team supervision and 24 hour management of active medical problems listed below.  Physiatrist and rehab team continue to assess barriers to discharge/monitor patient progress toward functional and medical goals  Care Tool:  Bathing    Body parts bathed by patient: Right arm, Left arm, Chest, Abdomen,  Front perineal area, Buttocks, Right upper leg, Left upper leg, Right lower leg, Left lower leg, Face         Bathing assist Assist Level: Independent with assistive device     Upper Body Dressing/Undressing Upper body dressing   What is the patient wearing?: Pull over shirt    Upper body assist Assist Level: Independent    Lower Body Dressing/Undressing Lower body dressing      What is the patient wearing?: Pants     Lower body assist Assist for lower body dressing: Independent with assitive device     Toileting Toileting    Toileting assist Assist for toileting: Independent with assistive device     Transfers Chair/bed transfer  Transfers assist     Chair/bed transfer assist level: Independent with assistive device Chair/bed transfer assistive device: Programmer, multimedia   Ambulation assist      Assist level: Independent with assistive device Assistive device: Walker-rolling Max distance: >150'   Walk 10 feet activity   Assist     Assist level: Independent with assistive device Assistive device: Walker-rolling   Walk 50 feet activity   Assist    Assist level: Independent with assistive device Assistive device: Walker-rolling    Walk 150 feet activity   Assist Walk 150 feet activity did not occur: Safety/medical concerns  Assist level: Independent with assistive device Assistive device: Walker-rolling    Walk 10 feet on uneven surface  activity   Assist     Assist level: Independent with assistive device Assistive device: Walker-rolling  Wheelchair     Assist Will patient use wheelchair at discharge?: No(Patient is a functional ambulator) Type of Wheelchair: Manual Wheelchair activity did not occur: N/A  Wheelchair assist level: Supervision/Verbal cueing Max wheelchair distance: 75 feet    Wheelchair 50 feet with 2 turns activity    Assist    Wheelchair 50 feet with 2 turns activity did not occur:  N/A   Assist Level: Supervision/Verbal cueing   Wheelchair 150 feet activity     Assist  Wheelchair 150 feet activity did not occur: N/A       Blood pressure (!) 93/55, pulse 75, temperature 98.1 F (36.7 C), temperature source Oral, resp. rate 18, weight 101.3 kg, SpO2 100 %.  Medical Problem List and Plan: 1.  Debility secondary to pericarditis with pericardial tamponade.   DC today  Will see patient for transitional care management in 1-2 weeks post-discharge 2.  Antithrombotics: -DVT/anticoagulation:  Pharmaceutical: Other (comment)--Eliquis             -antiplatelet therapy: N/A 3. Left hip pain/Pain Management:  On gabapentin 100 mg/hs. Tylenol prn.  Added lidocaine patch for pain control.              Monitor with increased mobility  Controlled with medications 4. Mood: Very motivated to work hard and get home. LCSW to follow for evaluation and support.             -antipsychotic agents: N/A 5. Neuropsych: This patient is capable of making decisions on her own behalf. 6. Skin/Wound Care: Routine pressure relief measures.  7. Fluids/Electrolytes/Nutrition: Strict I/O. 1200 cc FR/day. Labs with HP. 8. Pericarditis: On Colchicine MWF X 3 months. Monitor for agranulocytosis.   9. T2DM: Hgb A 1c- 4.9. Blood sugars tightly controlled on SSI.    CBG (last 3)  Recent Labs    09/17/19 0611 09/17/19 1158 09/18/19 0604  GLUCAP 89 82 76   Controlled on 10/17             Monitor with increased mobility 10. PAF: Now in NSR on amiodarone 200 mg bid--->taper to 200 mg daily starting 10/11.   11. Chronic hypotension: Baseline SBP 80-90's range on midodrine tid. Reports can drop to 50's with HD.              Monitor for signs and symptoms Ace wraps/teds/abdominal binder if necessary.  Persistently, but asymptomatic on 10/17 12.    ESRD: HD MWF at end of the day to help with tolerance of therapy.  13. Anemia of chronic disease: Being monitored. On weekly Aranesp.  Labs with  HD.  Hemoglobin 9.4 on 10/16 14. Leucocytosis: Resolved 15.  Morbidly obese: Encouraged weight loss  LOS: 9 days A FACE TO FACE EVALUATION WAS PERFORMED  Dreyden Rohrman Lorie Phenix 09/18/2019, 10:22 AM

## 2019-09-20 DIAGNOSIS — N2581 Secondary hyperparathyroidism of renal origin: Secondary | ICD-10-CM | POA: Diagnosis not present

## 2019-09-20 DIAGNOSIS — Z992 Dependence on renal dialysis: Secondary | ICD-10-CM | POA: Diagnosis not present

## 2019-09-20 DIAGNOSIS — N186 End stage renal disease: Secondary | ICD-10-CM | POA: Diagnosis not present

## 2019-09-20 DIAGNOSIS — T7840XA Allergy, unspecified, initial encounter: Secondary | ICD-10-CM | POA: Insufficient documentation

## 2019-09-20 NOTE — Discharge Summary (Signed)
Physician Discharge Summary  Patient ID: Brenda Arnold MRN: [id number] DOB/AGE: 62-Sep-1957 62 y.o.  Admit date: 09/09/2019 Discharge date: 09/20/2019  Discharge Diagnoses:  Principal Problem:   Physical debility Active Problems:   Type 2 diabetes mellitus with complication, without long-term current use of insulin (HCC)   Status post total replacement of right hip   Hypotension   ESRD on hemodialysis ([facility])   Leukocytosis   ESRD on dialysis [facility])   Discharged Condition: stable   Significant Diagnostic Studies: Dg Chest 2 View  Result Date: 09/15/2019 CLINICAL DATA:  Left-sided chest pain. Recent VATS with drainage of pericardial fluid. EXAM: CHEST - 2 VIEW COMPARISON:  09/10/2019 FINDINGS: Stable heart size. Small to moderate bilateral pleural effusions, left greater than right. Right-sided pleural effusion may be slightly larger compared to the prior chest x-ray. Smoothly contoured opacity along the lateral aspect of the right lower chest may relate to some loculated pleural fluid laterally. No edema, focal airspace disease or pneumothorax identified. IMPRESSION: Small to moderate bilateral pleural effusions, left greater than right. Right-sided pleural effusion may be slightly larger compared to the prior chest x-ray. Electronically Signed   By: Aletta Edouard M.D.   On: 09/15/2019 12:26   Dg Chest 2 View  Result Date: 09/10/2019 CLINICAL DATA:  Leukocytosis. EXAM: CHEST - 2 VIEW COMPARISON:  September 07, 2019 FINDINGS: Enlarged cardiac silhouette. Calcific atherosclerotic disease of the aorta. Bilateral pleural effusions. Bilateral lower lobe, left greater than right, airspace consolidation versus atelectasis. Osseous structures are without acute abnormality. Soft tissues are grossly normal. IMPRESSION: 1. Bilateral pleural effusions. 2. Bilateral lower lobe, left greater than right, airspace consolidation versus atelectasis. 3. Calcific atherosclerotic disease of the aorta.  Electronically Signed   By: Fidela Salisbury M.D.   On: 09/10/2019 20:55   Labs:  Basic Metabolic Panel: Recent Labs  Lab 09/14/19 0634 09/15/19 1252 09/17/19 0521  NA 136 137 135  K 4.1 4.1 3.9  CL 96* 95* 96*  CO2 27 27 26   GLUCOSE 83 98 76  BUN 24* 40* 33*  CREATININE 7.04* 9.48* 8.43*  CALCIUM 8.7* 9.1 9.0  PHOS 1.8* 3.0 3.9    CBC: Recent Labs  Lab 09/14/19 0634 09/15/19 1252 09/17/19 0521  WBC 8.7 10.3 7.3  NEUTROABS 5.7  --  4.5  HGB 9.0* 8.7* 9.4*  HCT 29.1* 28.2* 30.5*  MCV 101.4* 102.9* 102.0*  PLT 269 294 275    CBG: Recent Labs  Lab 09/16/19 2134 09/17/19 0611 09/17/19 1158 09/18/19 0604 09/18/19 1150  GLUCAP 95 89 82 76 91    Brief HPI:   Brenda Arnold is a 63 y.o. female with history of T2DM, PAF, ESRD-hemodialysis MWF, OA bilateral hip-tests in need of L THR; who was admitted on 08/25/2019 with chest pain and abnormal EKG secondary to pericarditis.  CT of head was negative for aneurysm and PE echo showed EF of 60 to 65% with no pericardial effusion and she was started on colchicine for treatment.  She did develop A. fib with RVR and was started on amiodarone for rate control.  She had worsening of hypotension with chest pain on 09/29 and was found to have large pericardial effusion with concerns of tamponade.  She was taken to the OR for right VATS with pericardial window by CT surgery.  Postprocedure had hypotension which was treated with IV albumin as well as recurrent A. fib with with RVR requiring amiodarone bolus.  Pericardial tube was DC'd on 10//3 and mild pleuritic pain  with bouts of tachycardia with activity was decreasing.  Patient was noted to have deficits in balance as well as DOE and tachycardia affecting overall activity.  CIR was recommended due to functional decline.   Hospital Course: Brenda Arnold was admitted to rehab 09/09/2019 for inpatient therapies to consist of PT, ST and OT at least three hours five days a week. Past  admission physiatrist, therapy team and rehab RN have worked together to provide customized collaborative inpatient rehab.  She continues on Eliquis and follow-up H&H is stable.  Her blood pressureswere monitored on TID basis and no orthostatic symptoms noted on midodrine 3 times daily.  Serial CBC shows leukocytosis has resolved.  She was noted to have low-grade fevers at admission however these have resolved and no signs of infection noted.  Follow-up chest x-ray showed right greater than left pleural effusions but no respiratory symptoms reported.  Blood sugars controlled on diet with sliding scale insulin on as needed basis.   Hemodialysis has been ongoing at the end of the day to help with tolerance of therapy.  She is to continue on colchicine 0.36 mg Monday Wednesday Friday for three total months to complete treatment of pericarditis.  Secondary hyperparathyroidism has been monitored and calcium was at goal rate but noted to have hypophosphatemia therefore Velphoro was discontinued. She has made gains during rehab stay with improvement in activity tolerance and is currently at modified independent level. She will continue to receive follow up outpatient PT at [facility] neuro rehab after discharge.  Rehab course: During patient's stay in rehab weekly team conferences were held to monitor patient's progress, set goals and discuss barriers to discharge. At admission, patient required min assist with basic self-care tasks and contact-guard assist with mobility.  She  has had improvement in activity tolerance, balance, postural control as well as ability to compensate for deficits.  She is able to complete ADL and IADLs tasks at modified independent level.  She is modified independent for transfers and is able to ambulate greater than 150 feet using rolling walker at modified independent level.  She is able to climb 4 stairs with bilateral rails at modified independent level.  No family education was needed due to  her independent level.    Disposition: Home  Diet: Carb modified medium/renal diet.  1200 cc FR/day  Special Instructions: 1.  No driving or strenuous activity till cleared by MD.  Allergies as of 09/18/2019      Reactions   Iodine Shortness Of Breath   Shellfish Allergy Shortness Of Breath   Norvasc [amlodipine] Swelling   SWELLING REACTION UNSPECIFIED       Medication List    STOP taking these medications   bisacodyl 10 MG suppository Commonly known as: DULCOLAX   ethyl chloride spray   polycarbophil 625 MG tablet Commonly known as: FIBERCON   Velphoro 500 MG chewable tablet Generic drug: sucroferric oxyhydroxide     TAKE these medications   amiodarone 200 MG tablet Commonly known as: PACERONE Take 1 tablet (200 mg total) by mouth daily. What changed: Another medication with the same name was removed. Continue taking this medication, and follow the directions you see here.   calcitRIOL 0.25 MCG capsule Commonly known as: ROCALTROL Take 3 capsules (0.75 mcg total) by mouth every Monday, Wednesday, and Friday with hemodialysis.   colchicine 0.6 MG tablet Take 0.5 tablets (0.3 mg total) by mouth every Monday, Wednesday, and Friday with hemodialysis.   docusate sodium 100 MG capsule Commonly known  as: COLACE Take 1 capsule (100 mg total) by mouth 2 (two) times daily. Notes to patient: For constipation--OTC   Eliquis 5 MG Tabs tablet Generic drug: apixaban Take 5 mg by mouth 2 (two) times daily.   gabapentin 100 MG capsule Commonly known as: NEURONTIN Take 100 mg by mouth at bedtime.   midodrine 10 MG tablet Commonly known as: PROAMATINE Take 1 tablet (10 mg total) by mouth 3 (three) times daily with meals.   multivitamin Tabs tablet Take 1 tablet by mouth at bedtime.   Muscle Rub 10-15 % Crea Apply 1 application topically as needed for muscle pain.      Follow-up Information    Kelton Pillar, MD. Call.   Specialty: Family Medicine Why: for  post hospital follow up Contact information: [address] [place] [address] [phone]        Sueanne Margarita, MD. Call.   Specialty: Cardiology Why: for follow up appontment Contact information: [id number] [place] [address] [phone]        Lajuana Matte, MD Follow up on 09/24/2019.   Specialty: Thoracic Surgery Why: Be there at 12:30 pm.  Contact information: [place] [address] [address] [phone]        Courtney Heys, MD. Call.   Specialty: Physical Medicine and Rehabilitation Why: as needed Contact information: [id number] [place] [address] [phone]           Signed: Bary Leriche 09/20/2019, 4:40 PM

## 2019-09-21 ENCOUNTER — Telehealth: Payer: Self-pay

## 2019-09-21 NOTE — Telephone Encounter (Signed)
Transitional Care call-patient    1. Are you/is patient experiencing any problems since coming home? No Are there any questions regarding any aspect of care? Yes 2. Are there any questions regarding medications administration/dosing? No Are meds being taken as prescribed? Yes Patient should review meds with caller to confirm 3. Have there been any falls? No 4. Has Home Health been to the house and/or have they contacted you? [facility] If not, have you tried to contact them? Can we help you contact them? 5. Are bowels and bladder emptying properly? Yes Are there any unexpected incontinence issues? No If applicable, is patient following bowel/bladder programs? 6. Any fevers, problems with breathing, unexpected pain? Chest pain,called PCP 7. Are there any skin problems or new areas of breakdown? No  8. Has the patient/family member arranged specialty MD follow up (ie cardiology/neurology/renal/surgical/etc)? Yes  Can we help arrange? 9. Does the patient need any other services or support that we can help arrange? No 10. Are caregivers following through as expected in assisting the patient? Yes 11. Has the patient quit smoking, drinking alcohol, or using drugs as recommended? Yes  Appointment time 1:00 pm arrive time 12:40 pm and with Danella Sensing, NP on 09/30/2019 [place]

## 2019-09-22 ENCOUNTER — Telehealth: Payer: Self-pay | Admitting: Nephrology

## 2019-09-22 DIAGNOSIS — N2581 Secondary hyperparathyroidism of renal origin: Secondary | ICD-10-CM | POA: Diagnosis not present

## 2019-09-22 DIAGNOSIS — N186 End stage renal disease: Secondary | ICD-10-CM | POA: Diagnosis not present

## 2019-09-22 DIAGNOSIS — Z992 Dependence on renal dialysis: Secondary | ICD-10-CM | POA: Diagnosis not present

## 2019-09-22 NOTE — Telephone Encounter (Signed)
Transition of care contact from inpatient facility  Date of discharge: 09/20/2019 Date of contact: 09/22/2019 Method of contact: phone Spoke with patient  Patient contacted to discuss transition of care from recent inpatient hospitalization.  Patient admitted to [facility] from 9/23-10/8, followed by CIR admission from 10/8 - 09/20/2019 with the discharge diagnosis of physical debility and cardiac tamponade/pericardial effusion/acute pericarditis and A fib.   Medication changes were reviewed.   Patient followed up with OP dialysis center today and will return on Friday to follow up with provider at dialysis.   Other follow up needs include: plan to f/u with rehab tomorrow to determine if she can return to work. No other needs expressed.   Jen Mow, PA-C [facility] Pager: [phone]

## 2019-09-23 ENCOUNTER — Encounter: Payer: Self-pay | Admitting: Physical Therapy

## 2019-09-23 ENCOUNTER — Ambulatory Visit: Payer: BC Managed Care – PPO | Attending: Physical Medicine and Rehabilitation | Admitting: Physical Therapy

## 2019-09-23 ENCOUNTER — Other Ambulatory Visit: Payer: Self-pay

## 2019-09-23 DIAGNOSIS — M6281 Muscle weakness (generalized): Secondary | ICD-10-CM | POA: Insufficient documentation

## 2019-09-23 DIAGNOSIS — R2689 Other abnormalities of gait and mobility: Secondary | ICD-10-CM | POA: Insufficient documentation

## 2019-09-23 DIAGNOSIS — R5381 Other malaise: Secondary | ICD-10-CM | POA: Diagnosis not present

## 2019-09-23 NOTE — Patient Instructions (Signed)
Access Code: [email or web address]  URL: [email or web address]  Date: 09/23/2019  Prepared by: Elsie Ra   Exercises  Sit to Stand with Armchair - 10 reps - 1-2 sets - 2x daily - 6x weekly  Standing Hip Abduction with Counter Support - 10 reps - 3 sets - 2x daily - 6x weekly  Standing Marching - 10 reps - 1-3 sets - 2x daily - 6x weekly  Side Stepping with Counter Support - 3-5 reps - 1 sets - 2x daily - 6x weekly  Walk around your room/house once every hour

## 2019-09-23 NOTE — Therapy (Signed)
[facility] [address] [place], [place], [address] Phone: [phone]   Fax:  [phone]  Physical Therapy Evaluation  Patient Details  Name: Brenda Arnold MRN: [id number] Date of Birth: Oct 02, 1956 Referring Provider (PT): Courtney Heys, MD   Encounter Date: 09/23/2019  PT End of Session - 09/23/19 2340    Visit Number  1    Number of Visits  12    Date for PT Re-Evaluation  11/04/19    Authorization Type  MCR/BCBS    PT Start Time  1700    PT Stop Time  1750    PT Time Calculation (min)  50 min    Activity Tolerance  Patient limited by fatigue    Behavior During Therapy  [facility] for tasks assessed/performed       Past Medical History:  Diagnosis Date  . Anemia   . Arthritis   . Asthma   . Complication of anesthesia    difficulty with getting oxygen saturation up  . Diabetes mellitus   . Dyslipidemia   . Epistaxis 11/05/2012  . ESRD (end stage renal disease) on dialysis [facility])    "MWF; Jeneen Rinks" (09/15/2018)  . History of nuclear stress test    Myoview 5/18: EF 68, no infarct or ischemia, low risk  . HTN (hypertension)   . Hyperlipidemia   . Hyperparathyroidism due to renal insufficiency ([place])   . Obesity    s/p panniculectomy  . Paroxysmal A-fib (HCC)    a. chronic coumadin;  b. 12/2009 Echo: EF 60-65%, Gr 1 DD.  Marland Kitchen PCOS (polycystic ovarian syndrome)   . Pericarditis 08/2019  . Pneumonia   . Seasonal allergies   . Sleep apnea    a. not using CPAP, last study  >8 yrs  . Vitamin D deficiency     Past Surgical History:  Procedure Laterality Date  . ABDOMINAL HYSTERECTOMY     with panniculctomy  . AV FISTULA PLACEMENT  09/02/2012   Procedure: ARTERIOVENOUS (AV) FISTULA CREATION;  Surgeon: Elam Dutch, MD;  Location: [facility] OR;  Service: Vascular;  Laterality: Left;  Creation of Left Radial-Cephalic Fistula   . BREAST SURGERY     Biopsy right breast  . COLONOSCOPY W/ BIOPSIES AND  POLYPECTOMY    . DILATION AND CURETTAGE OF UTERUS    . KNEE ARTHROSCOPY Right   . REVISON OF ARTERIOVENOUS FISTULA Left 04/27/2014   Procedure: REVISON OF LEFT RADIAL-CEPHALIC ARTERIOVENOUS FISTULA;  Surgeon: Mal Misty, MD;  Location: [facility];  Service: Vascular;  Laterality: Left;  . TOTAL HIP ARTHROPLASTY Right 09/15/2018  . TOTAL HIP ARTHROPLASTY Right 09/15/2018   Procedure: RIGHT TOTAL HIP ARTHROPLASTY ANTERIOR APPROACH;  Surgeon: Leandrew Koyanagi, MD;  Location: [facility];  Service: Orthopedics;  Laterality: Right;  . UVULOPLASTY    . VIDEO ASSISTED THORACOSCOPY (VATS)/WEDGE RESECTION Right 08/31/2019   Procedure: VIDEO ASSISTED THORACOSCOPY/ DRAINAGE OF PERICARDIAL EFFUSION/ PERICARDIAL WINDOW/ ABORTED PERICARDIOCENTESIS ;  Surgeon: Lajuana Matte, MD;  Location: [facility];  Service: Thoracic;  Laterality: Right;    There were no vitals filed for this visit.   Subjective Assessment - 09/23/19 1708    Subjective  history of T2DM, PAF, ESRD- HD MWF, OA bilateral hips s/p R-THR and need of L-THR; who was admitted on 08/25/2019 with chest pain and abnormal EKG found to be secondary to pericarditis. She was taken to the OR for right VATS with pericardial window by CT surgery. She was D/C from hospital on 09/18/19.  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 She used RW prior to this hospitilization due to balance and hip pain. She now has decreased endurance and gets out of breath just walking in her house.    Pertinent History  history of T2DM, PAF, ESRD- HD MWF, OA bilateral hips s/p R-THR and need of L-THR; recent hospitilzaton for pericarditis    Limitations  Sitting;Lifting;Standing;House hold activities    How long can you stand comfortably?  4-5 min    How long can you walk comfortably?  household distances    Patient Stated Goals  get my endurance back    Currently in Pain?  Yes    Pain Score  5     Pain Location  Hip    Pain Orientation  Left    Pain Descriptors / Indicators   Aching    Pain Type  Chronic pain         [facility] PT Assessment - 09/23/19 0001      Assessment   Medical Diagnosis  Debility due to pericarditis    Referring Provider (PT)  Courtney Heys, MD    Onset Date/Surgical Date  --   Chest tube 08/25/19   Hand Dominance  Right    Next MD Visit  follows up with surgeon who did chest tube tommorow    Prior Therapy  had PT after hip replacement, had acute PT/inpt  at the hospital for one week      Precautions   Precautions  --   hypotension     Restrictions   Other Position/Activity Restrictions  not cleared to drive yet      Balance Screen   Has the patient fallen in the past 6 months  No      [place] residence    Additional Comments  one step up to enter, relays no difficulty with this      Prior Function   Level of Independence  Independent    Leisure  watching TV, goes to church      Cognition   Overall Cognitive Status  Within Functional Limits for tasks assessed      Sensation   Light Touch  Appears Intact      Coordination   Gross Motor Movements are Fluid and Coordinated  Yes    Finger Nose Finger Test  WNL    Heel Shin Test  WNL      ROM / Strength   AROM / PROM / Strength  AROM;Strength      AROM   Overall AROM   Within functional limits for tasks performed      Strength   Overall Strength Comments  UE overall 5/5 MMT, LE overall 4+/5 MMT grossly tested in sitting      Transfers   Transfers  Independent with all Transfers      Ambulation/Gait   Ambulation/Gait  Yes    Ambulation/Gait Assistance  6: Modified independent (Device/Increase time)    Ambulation Distance (Feet)  200 Feet    Assistive device  Rolling walker    Gait Pattern  Decreased step length - right;Decreased step length - left;Decreased hip/knee flexion - right;Decreased hip/knee flexion - left;Decreased dorsiflexion - right;Decreased dorsiflexion - left    Gait velocity  20 ft in 7 sec=2.85 ft/sec       6 Minute Walk- Baseline   6 Minute Walk- Baseline  yes    BP (mmHg)  107/55    HR (  bpm)  74    02 Sat (%RA)  98 %    Modified Borg Scale for Dyspnea  1- Very mild shortness of breath      6 Minute walk- Post Test   6 Minute Walk Post Test  yes    BP (mmHg)  122/59    HR (bpm)  88    02 Sat (%RA)  94 %    Modified Borg Scale for Dyspnea  7- Severe shortness of breath or very hard breathing      6 minute walk test results    Aerobic Endurance Distance Walked  523    Endurance additional comments  needed one sitting rest break, used RW with CGA      Standardized Balance Assessment   Standardized Balance Assessment  Timed Up and Go Test;Five Times Sit to Stand    Five times sit to stand comments   20 sec using UE to push up from standard chair      Timed Up and Go Test   Normal TUG (seconds)  17    TUG Comments  using RW                Objective measurements completed on examination: See above findings.              PT Education - 09/23/19 2339    Education Details  HEP, POC, exam findings, need for shorter but more frequent activity to slowly build her endurance    Person(s) Educated  Patient    Methods  Explanation;Demonstration;Verbal cues;Handout    Comprehension  Verbalized understanding;Need further instruction          PT Long Term Goals - 09/23/19 2348      PT LONG TERM GOAL #1   Title  Pt will be I and compliant with HEP. (Target for all goals 6 weeks 11/04/19)    Status  New      PT LONG TERM GOAL #2   Title  Pt will improve 6 minute walk test to >700 ft to show improved endurance for community ambulation    Baseline  523    Status  New      PT LONG TERM GOAL #3   Title  Pt will improve gross leg strength to 5/5 MMT bilat tested in sitting.    Baseline  4+/5    Status  New      PT LONG TERM GOAL #4   Title  Pt will improve TUG to and 5TSTS test <14 seconds to show improved balance, gait, enduance.    Status  New              Plan - 09/23/19 2341    Clinical Impression Statement  Pt presents with debility due to pericarditis requiring recent hospitilization on 08/25/19 and requiring chest tube operation. She was discharged on 09/18/19 after inpt Rehab then had HHPT. She now presents to outpaitent PT with decreased overall endurance, decreased gross leg strength, decreased activity tolerance for standing more than 5 min, decreased balance and gait instability (although this was present at baseline and she used RW at baseline). She will benefit from skilled PT to address her defeficts.    Personal Factors and Comorbidities  Comorbidity 1;Comorbidity 2;Comorbidity 3+    Comorbidities  history of T2DM, PAF, ESRD- HD MWF, OA bilateral hips s/p R-THR and need of L-THR; who was admitted  to hospital on 08/25/2019    Examination-Activity Limitations  Bend;Carry;Lift;Locomotion Level;Squat;Stairs;Stand  Examination-Participation Restrictions  Church;Meal Prep;School;Community Activity;Driving;Laundry    Stability/Clinical Decision Making  Evolving/Moderate complexity    Clinical Decision Making  Moderate    Rehab Potential  Good    PT Frequency  2x / week    PT Duration  6 weeks    PT Treatment/Interventions  ADLs/Self Care Home Management;Cryotherapy;Electrical Stimulation;Moist Heat;DME Instruction;Gait Scientist, forensic;Therapeutic activities;Therapeutic exercise;Balance training;Neuromuscular re-education;Manual techniques;Patient/family education;Passive range of motion    PT Next Visit Plan  progress endurance and overall strength as able.    PT Home Exercise Plan  Access Code: [email or web address]    Consulted and Agree with Plan of Care  Patient       Patient will benefit from skilled therapeutic intervention in order to improve the following deficits and impairments:  Abnormal gait, Cardiopulmonary status limiting activity, Decreased activity tolerance, Decreased balance, Decreased endurance, Decreased  strength, Difficulty walking  Visit Diagnosis: Muscle weakness (generalized)  Other abnormalities of gait and mobility  Debility     Problem List Patient Active Problem List   Diagnosis Date Noted  . ESRD on dialysis ([place])   . Physical debility 09/09/2019  . Pericardial effusion   . SOB (shortness of breath)   . Leukocytosis   . Anemia of chronic disease   . PAF (paroxysmal atrial fibrillation) ([place])   . Postoperative pain   . Cardiac tamponade   . Chest pain at rest 08/26/2019  . Tachycardia 08/26/2019  . Pericarditis 08/26/2019  . Angina pectoris, unspecified ([place]) 08/25/2019  . CAD (coronary artery disease) 11/05/2018  . A-fib ([place]) 11/05/2018  . Atrial fibrillation with RVR ([place])   . Hypotension 11/04/2018  . ESRD on hemodialysis ([place]) 11/04/2018  . DVT (deep venous thrombosis) ([place]) 11/03/2018  . Primary osteoarthritis of right hip 09/15/2018  . Status post total replacement of right hip 09/15/2018  . Varicose veins of bilateral lower extremities with other complications 0000000  . Anticoagulation goal of INR 2 to 3 09/30/2014  . Pain in lower limb 05/02/2014  . Onychomycosis due to dermatophyte 04/06/2013  . Pain in joint, ankle and foot 04/06/2013  . Bradycardia 11/06/2012  . Left-sided epistaxis 11/05/2012  . DM (diabetes mellitus) ([place]) 11/05/2012  . OSA (obstructive sleep apnea) 11/05/2012  . Acute posterior epistaxis 11/05/2012  . CKD (chronic kidney disease) 11/05/2012  . End stage renal disease ([place]) 06/25/2012  . Type 2 diabetes mellitus with complication, without long-term current use of insulin ([place]) 12/20/2009  . OBESITY 12/20/2009  . OBESITY-MORBID (>100') 12/20/2009  . Essential hypertension 12/20/2009  . Paroxysmal atrial fibrillation ([place]) 12/20/2009  . Chest pain 12/20/2009  . ABNORMAL CV (STRESS) TEST 12/20/2009    Silvestre Mesi 09/23/2019, 11:52 PM  [facility] [address] [place], [place], [address] Phone: [phone]   Fax:  [phone]  Name: Brenda Arnold MRN: [id number] Date of Birth: 1956/09/17

## 2019-09-24 ENCOUNTER — Encounter: Payer: Self-pay | Admitting: Thoracic Surgery (Cardiothoracic Vascular Surgery)

## 2019-09-24 ENCOUNTER — Ambulatory Visit (INDEPENDENT_AMBULATORY_CARE_PROVIDER_SITE_OTHER): Payer: Self-pay | Admitting: Thoracic Surgery (Cardiothoracic Vascular Surgery)

## 2019-09-24 VITALS — BP 124/71 | HR 57 | Temp 97.5°F | Resp 20 | Ht 66.0 in | Wt 217.0 lb

## 2019-09-24 DIAGNOSIS — N2581 Secondary hyperparathyroidism of renal origin: Secondary | ICD-10-CM | POA: Diagnosis not present

## 2019-09-24 DIAGNOSIS — N186 End stage renal disease: Secondary | ICD-10-CM | POA: Diagnosis not present

## 2019-09-24 DIAGNOSIS — Z992 Dependence on renal dialysis: Secondary | ICD-10-CM

## 2019-09-24 DIAGNOSIS — I480 Paroxysmal atrial fibrillation: Secondary | ICD-10-CM

## 2019-09-24 DIAGNOSIS — Z9889 Other specified postprocedural states: Secondary | ICD-10-CM

## 2019-09-24 NOTE — Progress Notes (Signed)
      [address]       [place] [address]             [id number]        Krystian R Westermeyer [facility] Medical Record [id number] Date of Birth: 11-Nov-1956  Referring: Garner Nash, DO Primary Care: Kelton Pillar, MD Primary Cardiologist:Traci Radford Pax, MD  Reason for visit:   follow-up  History of Present Illness:     Mrs. Fatheree presents for her first follow-up appointment after being discharged from the hospital.  She underwent an emergent right VATS pericardial window.  Overall she has done well since being discharged.  She has tolerated complete dialysis runs and she is currently under her dry weight.  She has no complaints  Physical Exam: BP 124/71 (BP Location: Right Arm)   Pulse (!) 57   Temp (!) 97.5 F (36.4 C) (Skin)   Resp 20   Ht 5\' 6"  (1.676 m)   Wt 217 lb (98.4 kg)   SpO2 99% Comment: RA  BMI 35.02 kg/m   Alert NAD   Incision clean.   Abdomen soft, NT/ND Trace peripheral edema   Diagnostic Studies & Laboratory data:  Path: Pathology only  revealed fibrosis    Assessment / Plan:   63 year old female status post right VATS pericardial window for likely a uremic pericardial effusion.  Currently doing well. No follow-up necessary.   Lajuana Matte 09/24/2019 3:50 PM

## 2019-09-27 DIAGNOSIS — N2581 Secondary hyperparathyroidism of renal origin: Secondary | ICD-10-CM | POA: Diagnosis not present

## 2019-09-27 DIAGNOSIS — N186 End stage renal disease: Secondary | ICD-10-CM | POA: Diagnosis not present

## 2019-09-27 DIAGNOSIS — Z992 Dependence on renal dialysis: Secondary | ICD-10-CM | POA: Diagnosis not present

## 2019-09-28 ENCOUNTER — Ambulatory Visit: Payer: BC Managed Care – PPO | Admitting: Physical Therapy

## 2019-09-28 ENCOUNTER — Other Ambulatory Visit: Payer: Self-pay

## 2019-09-28 DIAGNOSIS — R5381 Other malaise: Secondary | ICD-10-CM | POA: Diagnosis not present

## 2019-09-28 DIAGNOSIS — M6281 Muscle weakness (generalized): Secondary | ICD-10-CM | POA: Diagnosis not present

## 2019-09-28 DIAGNOSIS — I319 Disease of pericardium, unspecified: Secondary | ICD-10-CM | POA: Diagnosis not present

## 2019-09-28 DIAGNOSIS — I480 Paroxysmal atrial fibrillation: Secondary | ICD-10-CM | POA: Diagnosis not present

## 2019-09-28 DIAGNOSIS — R2689 Other abnormalities of gait and mobility: Secondary | ICD-10-CM

## 2019-09-28 DIAGNOSIS — I314 Cardiac tamponade: Secondary | ICD-10-CM | POA: Diagnosis not present

## 2019-09-28 NOTE — Therapy (Signed)
[facility] [address] [place], [place], [address] Phone: [phone]   Fax:  [phone]  Physical Therapy Treatment  Patient Details  Name: Brenda Arnold MRN: [id number] Date of Birth: May 02, 1956 Referring Provider (PT): Courtney Heys, MD   Encounter Date: 09/28/2019  PT End of Session - 09/28/19 1746    Visit Number  2    Number of Visits  12    Date for PT Re-Evaluation  11/04/19    Authorization Type  MCR/BCBS    PT Start Time  1655    PT Stop Time  1733    PT Time Calculation (min)  38 min    Activity Tolerance  Patient limited by fatigue    Behavior During Therapy  [facility] for tasks assessed/performed       Past Medical History:  Diagnosis Date  . Anemia   . Arthritis   . Asthma   . Complication of anesthesia    difficulty with getting oxygen saturation up  . Diabetes mellitus   . Dyslipidemia   . Epistaxis 11/05/2012  . ESRD (end stage renal disease) on dialysis [facility])    "MWF; Jeneen Rinks" (09/15/2018)  . History of nuclear stress test    Myoview 5/18: EF 68, no infarct or ischemia, low risk  . HTN (hypertension)   . Hyperlipidemia   . Hyperparathyroidism due to renal insufficiency ([place])   . Obesity    s/p panniculectomy  . Paroxysmal A-fib (HCC)    a. chronic coumadin;  b. 12/2009 Echo: EF 60-65%, Gr 1 DD.  Marland Kitchen PCOS (polycystic ovarian syndrome)   . Pericarditis 08/2019  . Pneumonia   . Seasonal allergies   . Sleep apnea    a. not using CPAP, last study  >8 yrs  . Vitamin D deficiency     Past Surgical History:  Procedure Laterality Date  . ABDOMINAL HYSTERECTOMY     with panniculctomy  . AV FISTULA PLACEMENT  09/02/2012   Procedure: ARTERIOVENOUS (AV) FISTULA CREATION;  Surgeon: Elam Dutch, MD;  Location: [facility] OR;  Service: Vascular;  Laterality: Left;  Creation of Left Radial-Cephalic Fistula   . BREAST SURGERY     Biopsy right breast  . COLONOSCOPY W/ BIOPSIES AND POLYPECTOMY     . DILATION AND CURETTAGE OF UTERUS    . KNEE ARTHROSCOPY Right   . REVISON OF ARTERIOVENOUS FISTULA Left 04/27/2014   Procedure: REVISON OF LEFT RADIAL-CEPHALIC ARTERIOVENOUS FISTULA;  Surgeon: Mal Misty, MD;  Location: [facility];  Service: Vascular;  Laterality: Left;  . TOTAL HIP ARTHROPLASTY Right 09/15/2018  . TOTAL HIP ARTHROPLASTY Right 09/15/2018   Procedure: RIGHT TOTAL HIP ARTHROPLASTY ANTERIOR APPROACH;  Surgeon: Leandrew Koyanagi, MD;  Location: [facility];  Service: Orthopedics;  Laterality: Right;  . UVULOPLASTY    . VIDEO ASSISTED THORACOSCOPY (VATS)/WEDGE RESECTION Right 08/31/2019   Procedure: VIDEO ASSISTED THORACOSCOPY/ DRAINAGE OF PERICARDIAL EFFUSION/ PERICARDIAL WINDOW/ ABORTED PERICARDIOCENTESIS ;  Surgeon: Lajuana Matte, MD;  Location: [facility];  Service: Thoracic;  Laterality: Right;    There were no vitals filed for this visit.  Subjective Assessment - 09/28/19 1721    Subjective  Relays some compliance with HEP but does not do it everyday like she should. Relays she feels she is ready to go back to work and hopes MD will apporave this on friday.    Pertinent History  history of T2DM, PAF, ESRD- HD MWF, OA bilateral hips s/p R-THR and need of L-THR; recent  hospitilzaton for pericarditis    Limitations  Sitting;Lifting;Standing;House hold activities    How long can you stand comfortably?  4-5 min    How long can you walk comfortably?  household distances    Patient Stated Goals  get my endurance back    Currently in Pain?  No/denies                       [facility] Adult PT Treatment/Exercise - 09/28/19 0001      Ambulation/Gait   Ambulation/Gait  Yes    Ambulation/Gait Assistance  5: Supervision    Ambulation Distance (Feet)  --    Assistive device  Rolling walker    Gait Pattern  Decreased step length - right;Decreased step length - left;Decreased hip/knee flexion - right;Decreased hip/knee flexion - left;Decreased dorsiflexion - right;Decreased  dorsiflexion - left    Gait Comments  walked 115 ft in beginning, then 300 ft at the end      High Level Balance   High Level Balance Comments  in bars for mod tandem balance 1 min X 2, then walking fwd,retro, lateral, and with head turns up/down and lateral all with intermitt UE support as needed      Exercises   Exercises  Other Exercises    Other Exercises   Nu step 5 min L5 UE/LE, then standing heel toe raises X 10 with UE suport, sit to stands 2X5 with UE support, step ups 4 inch step with UE suport bilat X 10 bilat, seated LAQ and marches with 2 lbs 2X10 ea bilat                  PT Long Term Goals - 09/23/19 2348      PT LONG TERM GOAL #1   Title  Pt will be I and compliant with HEP. (Target for all goals 6 weeks 11/04/19)    Status  New      PT LONG TERM GOAL #2   Title  Pt will improve 6 minute walk test to >700 ft to show improved endurance for community ambulation    Baseline  523    Status  New      PT LONG TERM GOAL #3   Title  Pt will improve gross leg strength to 5/5 MMT bilat tested in sitting.    Baseline  4+/5    Status  New      PT LONG TERM GOAL #4   Title  Pt will improve TUG to and 5TSTS test <14 seconds to show improved balance, gait, enduance.    Status  New            Plan - 09/28/19 1746    Clinical Impression Statement  Progressed overall strength, endurance, and balance program with fair tolerance. She requires a few rest breaks during session due to fatigue. 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 Continue POC.    Personal Factors and Comorbidities  Comorbidity 1;Comorbidity 2;Comorbidity 3+    Comorbidities  history of T2DM, PAF, ESRD- HD MWF, OA bilateral hips s/p R-THR and need of L-THR; who was admitted  to hospital on 08/25/2019    Examination-Activity Limitations  Bend;Carry;Lift;Locomotion Level;Squat;Stairs;Stand    Examination-Participation  Restrictions  Church;Meal Prep;School;Community Activity;Driving;Laundry    Stability/Clinical Decision Making  Evolving/Moderate complexity    Rehab Potential  Good    PT Frequency  2x /  week    PT Duration  6 weeks    PT Treatment/Interventions  ADLs/Self Care Home Management;Cryotherapy;Electrical Stimulation;Moist Heat;DME Instruction;Gait Scientist, forensic;Therapeutic activities;Therapeutic exercise;Balance training;Neuromuscular re-education;Manual techniques;Patient/family education;Passive range of motion    PT Next Visit Plan  progress endurance and overall strength as able.    PT Home Exercise Plan  Access Code: [email or web address]    Consulted and Agree with Plan of Care  Patient       Patient will benefit from skilled therapeutic intervention in order to improve the following deficits and impairments:  Abnormal gait, Cardiopulmonary status limiting activity, Decreased activity tolerance, Decreased balance, Decreased endurance, Decreased strength, Difficulty walking  Visit Diagnosis: Muscle weakness (generalized)  Other abnormalities of gait and mobility  Debility     Problem List Patient Active Problem List   Diagnosis Date Noted  . ESRD on dialysis ([place])   . Physical debility 09/09/2019  . Pericardial effusion   . SOB (shortness of breath)   . Leukocytosis   . Anemia of chronic disease   . PAF (paroxysmal atrial fibrillation) ([place])   . Postoperative pain   . Cardiac tamponade   . Chest pain at rest 08/26/2019  . Tachycardia 08/26/2019  . Pericarditis 08/26/2019  . Angina pectoris, unspecified ([place]) 08/25/2019  . CAD (coronary artery disease) 11/05/2018  . A-fib ([place]) 11/05/2018  . Atrial fibrillation with RVR ([place])   . Hypotension 11/04/2018  . ESRD on hemodialysis ([place]) 11/04/2018  . DVT (deep venous thrombosis) ([place]) 11/03/2018  . Primary osteoarthritis of right hip 09/15/2018  . Status post total replacement of right hip 09/15/2018  . Varicose veins of  bilateral lower extremities with other complications 0000000  . Anticoagulation goal of INR 2 to 3 09/30/2014  . Pain in lower limb 05/02/2014  . Onychomycosis due to dermatophyte 04/06/2013  . Pain in joint, ankle and foot 04/06/2013  . Bradycardia 11/06/2012  . Left-sided epistaxis 11/05/2012  . DM (diabetes mellitus) ([place]) 11/05/2012  . OSA (obstructive sleep apnea) 11/05/2012  . Acute posterior epistaxis 11/05/2012  . CKD (chronic kidney disease) 11/05/2012  . End stage renal disease ([place]) 06/25/2012  . Type 2 diabetes mellitus with complication, without long-term current use of insulin ([place]) 12/20/2009  . OBESITY 12/20/2009  . OBESITY-MORBID (>100') 12/20/2009  . Essential hypertension 12/20/2009  . Paroxysmal atrial fibrillation ([place]) 12/20/2009  . Chest pain 12/20/2009  . ABNORMAL CV (STRESS) TEST 12/20/2009    Silvestre Mesi 09/28/2019, 5:50 PM  [facility] [address] [place], [place], [address] Phone: [phone]   Fax:  [phone]  Name: Brenda Arnold MRN: [id number] Date of Birth: [birth year]

## 2019-09-29 DIAGNOSIS — Z992 Dependence on renal dialysis: Secondary | ICD-10-CM | POA: Diagnosis not present

## 2019-09-29 DIAGNOSIS — N2581 Secondary hyperparathyroidism of renal origin: Secondary | ICD-10-CM | POA: Diagnosis not present

## 2019-09-29 DIAGNOSIS — N186 End stage renal disease: Secondary | ICD-10-CM | POA: Diagnosis not present

## 2019-09-30 ENCOUNTER — Ambulatory Visit: Payer: BC Managed Care – PPO

## 2019-09-30 ENCOUNTER — Encounter: Payer: Medicare Other | Attending: Registered Nurse | Admitting: Registered Nurse

## 2019-09-30 ENCOUNTER — Other Ambulatory Visit: Payer: Self-pay

## 2019-09-30 VITALS — BP 83/50 | HR 87

## 2019-09-30 VITALS — BP 116/74 | HR 79 | Temp 95.6°F | Ht 66.0 in | Wt 215.6 lb

## 2019-09-30 DIAGNOSIS — M6281 Muscle weakness (generalized): Secondary | ICD-10-CM | POA: Diagnosis not present

## 2019-09-30 DIAGNOSIS — R5381 Other malaise: Secondary | ICD-10-CM | POA: Diagnosis not present

## 2019-09-30 DIAGNOSIS — R2689 Other abnormalities of gait and mobility: Secondary | ICD-10-CM | POA: Diagnosis not present

## 2019-09-30 DIAGNOSIS — I959 Hypotension, unspecified: Secondary | ICD-10-CM | POA: Diagnosis not present

## 2019-09-30 DIAGNOSIS — E118 Type 2 diabetes mellitus with unspecified complications: Secondary | ICD-10-CM | POA: Diagnosis not present

## 2019-09-30 DIAGNOSIS — Z992 Dependence on renal dialysis: Secondary | ICD-10-CM | POA: Diagnosis not present

## 2019-09-30 DIAGNOSIS — I4891 Unspecified atrial fibrillation: Secondary | ICD-10-CM | POA: Insufficient documentation

## 2019-09-30 DIAGNOSIS — N186 End stage renal disease: Secondary | ICD-10-CM | POA: Insufficient documentation

## 2019-09-30 NOTE — Progress Notes (Signed)
Subjective:    Patient ID: Brenda Arnold, female    DOB: February 26, 1956, 63 y.o.   MRN: [id number]  HPI: Brenda Arnold is a 63 y.o. female who is here for transitional care visit in follow up of her physical debility, ESRD on hemodialysis, Type 2 DM, without long-term current use of insulin, Hypotension and atrial fibrillation with RVR.  According to discharge summary from Dr Maylene Roes:   She was admitted on 08/25/2019 with chest pain and abnormal EKG, her chest pain was thought to be secondary to pericarditis and she was started on colchicine during her dialysis days. Echocardiogram revealed moderate pericardial effusion without tamponade. She went into atrial fibrillation with RVR and subsequently developed cardiogenic shock and transferred to ICU. She was found to cardiac tamponade, underwent pericardial window with drain on 08/31/2019. Pericardial Tube was discontinued on 09/04/2019.   Brenda Arnold was admitted to inpatient rehabilitation on 09/09/2019 and discharged home on 09/20/2019. She is receiving outpatient therapy at [facility]. She denies pain. She rated her pain on Health and History 2. Reports she has a good appetite.    Pain Inventory Average Pain 2 Pain Right Now 2 My pain is dull  In the last 24 hours, has pain interfered with the following? General activity 0 Relation with others 0 Enjoyment of life 0 What TIME of day is your pain at its worst? night Sleep (in general) Fair  Pain is worse with: na Pain improves with: medication Relief from Meds: 9  Mobility walk without assistance how many minutes can you walk? 2 1/2 to 3 miles ability to climb steps?  yes  Function employed # of hrs/week 40  Neuro/Psych No problems in this area  Prior Studies Any changes since last visit?  no x-rays CT/MRI  Physicians involved in your care Primary care Kelton Pillar, MD   Family History  Problem Relation Age of Onset  . Lung cancer Father    died @ 23  . Hypertension Mother   . Diabetes Brother   . Kidney disease Brother    Social History   Socioeconomic History  . Marital status: Single    Spouse name: Not on file  . Number of children: Not on file  . Years of education: Not on file  . Highest education level: Not on file  Occupational History  . Occupation: Dance movement psychotherapist.    Employer: [employer]  Social Needs  . Financial resource strain: Not on file  . Food insecurity    Worry: Not on file    Inability: Not on file  . Transportation needs    Medical: Not on file    Non-medical: Not on file  Tobacco Use  . Smoking status: Never Smoker  . Smokeless tobacco: Never Used  Substance and Sexual Activity  . Alcohol use: No    Alcohol/week: 0.0 standard drinks  . Drug use: No  . Sexual activity: Not on file  Lifestyle  . Physical activity    Days per week: Not on file    Minutes per session: Not on file  . Stress: Not on file  Relationships  . Social Herbalist on phone: Not on file    Gets together: Not on file    Attends religious service: Not on file    Active member of club or organization: Not on file    Attends meetings of clubs or organizations: Not on file    Relationship status: Not on file  Other Topics Concern  . Not on file  Social History Narrative   Lives in [place] alone. 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   Past Surgical History:  Procedure Laterality Date  . ABDOMINAL HYSTERECTOMY     with panniculctomy  . AV FISTULA PLACEMENT  09/02/2012   Procedure: ARTERIOVENOUS (AV) FISTULA CREATION;  Surgeon: Elam Dutch, MD;  Location: [facility] OR;  Service: Vascular;  Laterality: Left;  Creation of Left Radial-Cephalic Fistula   . BREAST SURGERY     Biopsy right breast  . COLONOSCOPY W/ BIOPSIES AND POLYPECTOMY    . DILATION AND CURETTAGE OF UTERUS    . KNEE ARTHROSCOPY Right   . REVISON OF ARTERIOVENOUS FISTULA Left 04/27/2014   Procedure:  REVISON OF LEFT RADIAL-CEPHALIC ARTERIOVENOUS FISTULA;  Surgeon: Mal Misty, MD;  Location: [facility];  Service: Vascular;  Laterality: Left;  . TOTAL HIP ARTHROPLASTY Right 09/15/2018  . TOTAL HIP ARTHROPLASTY Right 09/15/2018   Procedure: RIGHT TOTAL HIP ARTHROPLASTY ANTERIOR APPROACH;  Surgeon: Leandrew Koyanagi, MD;  Location: [facility] [facility];  Service: Orthopedics;  Laterality: Right;  . UVULOPLASTY    . VIDEO ASSISTED THORACOSCOPY (VATS)/WEDGE RESECTION Right 08/31/2019   Procedure: VIDEO ASSISTED THORACOSCOPY/ DRAINAGE OF PERICARDIAL EFFUSION/ PERICARDIAL WINDOW/ ABORTED PERICARDIOCENTESIS ;  Surgeon: Lajuana Matte, MD;  Location: [facility];  Service: Thoracic;  Laterality: Right;   Past Medical History:  Diagnosis Date  . Anemia   . Arthritis   . Asthma   . Complication of anesthesia    difficulty with getting oxygen saturation up  . Diabetes mellitus   . Dyslipidemia   . Epistaxis 11/05/2012  . ESRD (end stage renal disease) on dialysis [facility])    "MWF; [facility] Rinks" (09/15/2018)  . History of nuclear stress test    Myoview 5/18: EF 68, no infarct or ischemia, low risk  . HTN (hypertension)   . Hyperlipidemia   . Hyperparathyroidism due to renal insufficiency ([place])   . Obesity    s/p panniculectomy  . Paroxysmal A-fib (HCC)    a. chronic coumadin;  b. 12/2009 Echo: EF 60-65%, Gr 1 DD.  Marland Kitchen PCOS (polycystic ovarian syndrome)   . Pericarditis 08/2019  . Pneumonia   . Seasonal allergies   . Sleep apnea    a. not using CPAP, last study  >8 yrs  . Vitamin D deficiency    BP 116/74   Pulse 79   Temp (!) 95.6 F (35.3 C)   Ht 5\' 6"  (1.676 m)   Wt 215 lb 9.6 oz (97.8 kg)   SpO2 98%   BMI 34.80 kg/m   Opioid Risk Score:   Fall Risk Score:  `1  Depression screen PHQ 2/9  No flowsheet data found.  Review of Systems  All other systems reviewed and are negative.      Objective:   Physical Exam Vitals signs and nursing note reviewed.  Constitutional:      Appearance: Normal  appearance.  Neck:     Musculoskeletal: Normal range of motion and neck supple.  Cardiovascular:     Rate and Rhythm: Normal rate and regular rhythm.     Pulses: Normal pulses.     Heart sounds: Normal heart sounds.  Pulmonary:     Effort: Pulmonary effort is normal.     Breath sounds: Normal breath sounds.  Musculoskeletal:     Comments: Normal Muscle Bulk and Muscle Testing Reveals:  Upper Extremities: Full ROM and Muscle Strength 5/5  Lower Extremities :  Full ROM and Muscle Strength 5/5 Arises from chair with ease using walker for support Narrow Based Gait   Skin:    General: Skin is warm and dry.  Neurological:     Mental Status: She is alert and oriented to person, place, and time.  Psychiatric:        Mood and Affect: Mood normal.        Behavior: Behavior normal.           Assessment & Plan:  1. Physical debility: Continue Outpatient Therapy. Continue to Monitor.  2.  ESRD on hemodialysis: Nephrology Following.  3. Type 2 DM, without long-term current use of insulin: Continue current medication regimen. PCP Following. 4.  Hypotension:Continue Midodrine. Nephrology Following.  5. Atrial fibrillation with RVR. Continue current medication regimen. Cardiology Following.   20 minutes of face to face patient care time was spent during this visit. All questions were encouraged and answered.  F/U in 4-6 weeks with Dr. Dagoberto Ligas

## 2019-09-30 NOTE — Therapy (Addendum)
[facility] [address] [place], [place], [address] Phone: [phone]   Fax:  [phone]  Physical Therapy Treatment  Patient Details  Name: Brenda Arnold MRN: [id number] Date of Birth: 04/27/1956 Referring Provider (PT): Courtney Heys, MD   Encounter Date: 09/30/2019  PT End of Session - 09/30/19 1529    Visit Number  3    Number of Visits  12    Date for PT Re-Evaluation  11/04/19    Authorization Type  MCR/BCBS    PT Start Time  1444    PT Stop Time  1526    PT Time Calculation (min)  42 min    Equipment Utilized During Treatment  Other (comment)   min guard to S prn   Activity Tolerance  Patient tolerated treatment well;Patient limited by pain    Behavior During Therapy  [facility] for tasks assessed/performed       Past Medical History:  Diagnosis Date  . Anemia   . Arthritis   . Asthma   . Complication of anesthesia    difficulty with getting oxygen saturation up  . Diabetes mellitus   . Dyslipidemia   . Epistaxis 11/05/2012  . ESRD (end stage renal disease) on dialysis [facility])    "MWF; Jeneen Rinks" (09/15/2018)  . History of nuclear stress test    Myoview 5/18: EF 68, no infarct or ischemia, low risk  . HTN (hypertension)   . Hyperlipidemia   . Hyperparathyroidism due to renal insufficiency ([place])   . Obesity    s/p panniculectomy  . Paroxysmal A-fib (HCC)    a. chronic coumadin;  b. 12/2009 Echo: EF 60-65%, Gr 1 DD.  Marland Kitchen PCOS (polycystic ovarian syndrome)   . Pericarditis 08/2019  . Pneumonia   . Seasonal allergies   . Sleep apnea    a. not using CPAP, last study  >8 yrs  . Vitamin D deficiency     Past Surgical History:  Procedure Laterality Date  . ABDOMINAL HYSTERECTOMY     with panniculctomy  . AV FISTULA PLACEMENT  09/02/2012   Procedure: ARTERIOVENOUS (AV) FISTULA CREATION;  Surgeon: Elam Dutch, MD;  Location: [facility] OR;  Service: Vascular;  Laterality: Left;  Creation of Left  Radial-Cephalic Fistula   . BREAST SURGERY     Biopsy right breast  . COLONOSCOPY W/ BIOPSIES AND POLYPECTOMY    . DILATION AND CURETTAGE OF UTERUS    . KNEE ARTHROSCOPY Right   . REVISON OF ARTERIOVENOUS FISTULA Left 04/27/2014   Procedure: REVISON OF LEFT RADIAL-CEPHALIC ARTERIOVENOUS FISTULA;  Surgeon: Mal Misty, MD;  Location: [facility];  Service: Vascular;  Laterality: Left;  . TOTAL HIP ARTHROPLASTY Right 09/15/2018  . TOTAL HIP ARTHROPLASTY Right 09/15/2018   Procedure: RIGHT TOTAL HIP ARTHROPLASTY ANTERIOR APPROACH;  Surgeon: Leandrew Koyanagi, MD;  Location: [facility];  Service: Orthopedics;  Laterality: Right;  . UVULOPLASTY    . VIDEO ASSISTED THORACOSCOPY (VATS)/WEDGE RESECTION Right 08/31/2019   Procedure: VIDEO ASSISTED THORACOSCOPY/ DRAINAGE OF PERICARDIAL EFFUSION/ PERICARDIAL WINDOW/ ABORTED PERICARDIOCENTESIS ;  Surgeon: Lajuana Matte, MD;  Location: [facility] OR;  Service: Thoracic;  Laterality: Right;    Vitals:   09/30/19 1447 09/30/19 1506 09/30/19 1511  BP: (!) 98/55 126/66 (!) 83/50  Pulse: 84 93 87    Subjective Assessment - 09/30/19 1447    Subjective  Pt denied falls or changes since last visit. Pt reported she's been cleaning her house, as her mom is visiting this coming  weekend and will stay about a month.    Pertinent History  history of T2DM, PAF, ESRD- HD MWF, OA bilateral hips s/p R-THR and need of L-THR; recent hospitilzaton for pericarditis    Patient Stated Goals  get my endurance back    Currently in Pain?  Yes    Pain Score  2     Pain Location  Hip    Pain Orientation  Left    Pain Descriptors / Indicators  Aching    Pain Type  Chronic pain    Pain Onset  More than a month ago    Pain Frequency  Intermittent    Aggravating Factors   Lying on L side (hasn't had hip replacement due to heart issues), at end of day pain incr.    Pain Relieving Factors  lying on R side instead       Therex and neuro re-ed: HEP reviewed and modified with cues and demo  for technique. Min guard to S for safety. Pt required seated rest breaks 2/2 decr. Endurance and L hip pain.  Access Code: [email or web address]  URL: [email or web address]  Date: 09/30/2019  Prepared by: Geoffry Paradise   Exercises  Sit to Stand with Armchair - 10 reps - 1-2 sets - 2x daily - 6x weekly (NMR) Standing Hip Abduction with Counter Support - 10 reps - 3 sets - 1x daily - 3x weekly (take half a step back with kicking leg) (therex) Standing Marching - 10 reps - 1-3 sets - 3x weekly (therex) Side Stepping with Counter Support - 3-5 reps - 1 sets - 3x weekly (NMR) Walking - 1 sets - 2x daily - 5x weekly (therex)                 [facility] Adult PT Treatment/Exercise - 09/30/19 1450      Ambulation/Gait   Ambulation/Gait  Yes    Ambulation/Gait Assistance  5: Supervision    Ambulation/Gait Assistance Details THEREX: Cues for upright posture and improve stride length. Mainly focused on timed walking program to improve endurance to establish walking program at home. 2 minute and 40 sec. rest break after 4 minutes of amb. on sidewalk outdoors. Pt then finished amb. last 100' back to gym from lobby and required additional seated rest break.   Ambulation Distance (Feet)  100 Feet   115' indoors, 500' outdoors   Assistive device  Rolling walker    Gait Pattern  Decreased step length - right;Decreased step length - left;Decreased hip/knee flexion - right;Decreased hip/knee flexion - left;Decreased dorsiflexion - right;Decreased dorsiflexion - left;Antalgic   mild L hip pain   Ambulation Surface  Level;Indoor;Outdoor;Paved             PT Education - 09/30/19 1528    Education Details  PT modified HEP as pt reported L hip pain incr. and has difficulty fitting HEP into home life. PT also encouraged pt to notify MD if BP decr.-pt stated dialysis center is monitoring BP.    Person(s) Educated  Patient    Methods  Explanation;Demonstration;Verbal cues;Handout     Comprehension  Returned demonstration;Verbalized understanding          PT Long Term Goals - 09/23/19 2348      PT LONG TERM GOAL #1   Title  Pt will be I and compliant with HEP. (Target for all goals 6 weeks 11/04/19)    Status  New      PT LONG TERM GOAL #2   Title  Pt will  improve 6 minute walk test to >700 ft to show improved endurance for community ambulation    Baseline  523    Status  New      PT LONG TERM GOAL #3   Title  Pt will improve gross leg strength to 5/5 MMT bilat tested in sitting.    Baseline  4+/5    Status  New      PT LONG TERM GOAL #4   Title  Pt will improve TUG to and 5TSTS test <14 seconds to show improved balance, gait, enduance.    Status  New            Plan - 09/30/19 1508    Clinical Impression Statement  Today's skilled session focused on establishing home walking program to improve pt's endurance. PT also modified HEP to reduce frequency as pt reported L hip pain is incr. (waiting for L THA) and she has a hard time fitting HEP into home life. Pt continues to require rest breaks 2/2 fatigue and gait deviations noted primarily 2/2 L hip pain. PT will continue to monitor BP during session, as it runs low. Pt would continue to benefit from skilled PT to improve safety and endurance during functional activities.    Personal Factors and Comorbidities  Comorbidity 1;Comorbidity 2;Comorbidity 3+    Comorbidities  history of T2DM, PAF, ESRD- HD MWF, OA bilateral hips s/p R-THR and need of L-THR; who was admitted  to hospital on 08/25/2019    Examination-Activity Limitations  Bend;Carry;Lift;Locomotion Level;Squat;Stairs;Stand    Examination-Participation Restrictions  Church;Meal Prep;School;Community Activity;Driving;Laundry    Stability/Clinical Decision Making  Evolving/Moderate complexity    Rehab Potential  Good    PT Frequency  2x / week    PT Duration  6 weeks    PT Treatment/Interventions  ADLs/Self Care Home Management;Cryotherapy;Electrical  Stimulation;Moist Heat;DME Instruction;Gait Scientist, forensic;Therapeutic activities;Therapeutic exercise;Balance training;Neuromuscular re-education;Manual techniques;Patient/family education;Passive range of motion    PT Next Visit Plan  progress endurance and overall strength as able.    PT Home Exercise Plan  Access Code: [email or web address]    Consulted and Agree with Plan of Care  Patient       Patient will benefit from skilled therapeutic intervention in order to improve the following deficits and impairments:  Abnormal gait, Cardiopulmonary status limiting activity, Decreased activity tolerance, Decreased balance, Decreased endurance, Decreased strength, Difficulty walking  Visit Diagnosis: Other abnormalities of gait and mobility  Debility  Muscle weakness (generalized)     Problem List Patient Active Problem List   Diagnosis Date Noted  . ESRD on dialysis ([place])   . Physical debility 09/09/2019  . Pericardial effusion   . SOB (shortness of breath)   . Leukocytosis   . Anemia of chronic disease   . PAF (paroxysmal atrial fibrillation) ([place])   . Postoperative pain   . Cardiac tamponade   . Chest pain at rest 08/26/2019  . Tachycardia 08/26/2019  . Pericarditis 08/26/2019  . Angina pectoris, unspecified ([place]) 08/25/2019  . CAD (coronary artery disease) 11/05/2018  . A-fib ([place]) 11/05/2018  . Atrial fibrillation with RVR ([place])   . Hypotension 11/04/2018  . ESRD on hemodialysis ([place]) 11/04/2018  . DVT (deep venous thrombosis) ([place]) 11/03/2018  . Primary osteoarthritis of right hip 09/15/2018  . Status post total replacement of right hip 09/15/2018  . Varicose veins of bilateral lower extremities with other complications 0000000  . Anticoagulation goal of INR 2 to 3 09/30/2014  . Pain in lower limb 05/02/2014  . Onychomycosis  due to dermatophyte 04/06/2013  . Pain in joint, ankle and foot 04/06/2013  . Bradycardia 11/06/2012  . Left-sided epistaxis 11/05/2012  . DM  (diabetes mellitus) ([place]) 11/05/2012  . OSA (obstructive sleep apnea) 11/05/2012  . Acute posterior epistaxis 11/05/2012  . CKD (chronic kidney disease) 11/05/2012  . End stage renal disease ([place]) 06/25/2012  . Type 2 diabetes mellitus with complication, without long-term current use of insulin ([place]) 12/20/2009  . OBESITY 12/20/2009  . OBESITY-MORBID (>100') 12/20/2009  . Essential hypertension 12/20/2009  . Paroxysmal atrial fibrillation ([place]) 12/20/2009  . Chest pain 12/20/2009  . ABNORMAL CV (STRESS) TEST 12/20/2009   Geoffry Paradise, PT,DPT 09/30/19 3:36 PM Phone: [phone] Fax: [phone]    Janella Rogala L 09/30/2019, 3:36 PM  [facility] [address] [place], [place], [address] Phone: [phone]   Fax:  [phone]  Name: Brenda Arnold MRN: [id number] Date of Birth: 04-26-1956

## 2019-10-01 DIAGNOSIS — Z992 Dependence on renal dialysis: Secondary | ICD-10-CM | POA: Diagnosis not present

## 2019-10-01 DIAGNOSIS — N2581 Secondary hyperparathyroidism of renal origin: Secondary | ICD-10-CM | POA: Diagnosis not present

## 2019-10-01 DIAGNOSIS — N186 End stage renal disease: Secondary | ICD-10-CM | POA: Diagnosis not present

## 2019-10-02 DIAGNOSIS — N186 End stage renal disease: Secondary | ICD-10-CM | POA: Diagnosis not present

## 2019-10-02 DIAGNOSIS — E1122 Type 2 diabetes mellitus with diabetic chronic kidney disease: Secondary | ICD-10-CM | POA: Diagnosis not present

## 2019-10-02 DIAGNOSIS — Z992 Dependence on renal dialysis: Secondary | ICD-10-CM | POA: Diagnosis not present

## 2019-10-04 ENCOUNTER — Encounter: Payer: Self-pay | Admitting: Registered Nurse

## 2019-10-04 DIAGNOSIS — N186 End stage renal disease: Secondary | ICD-10-CM | POA: Diagnosis not present

## 2019-10-04 DIAGNOSIS — N2581 Secondary hyperparathyroidism of renal origin: Secondary | ICD-10-CM | POA: Diagnosis not present

## 2019-10-04 DIAGNOSIS — Z992 Dependence on renal dialysis: Secondary | ICD-10-CM | POA: Diagnosis not present

## 2019-10-05 ENCOUNTER — Other Ambulatory Visit: Payer: Self-pay

## 2019-10-05 ENCOUNTER — Encounter: Payer: Self-pay | Admitting: Physical Therapy

## 2019-10-05 ENCOUNTER — Ambulatory Visit: Payer: BC Managed Care – PPO | Attending: Physical Medicine and Rehabilitation | Admitting: Physical Therapy

## 2019-10-05 VITALS — BP 106/56 | HR 87

## 2019-10-05 DIAGNOSIS — M6281 Muscle weakness (generalized): Secondary | ICD-10-CM | POA: Diagnosis not present

## 2019-10-05 DIAGNOSIS — R2689 Other abnormalities of gait and mobility: Secondary | ICD-10-CM | POA: Diagnosis not present

## 2019-10-05 DIAGNOSIS — R5381 Other malaise: Secondary | ICD-10-CM | POA: Insufficient documentation

## 2019-10-05 NOTE — Therapy (Signed)
[facility] [address] [place], [place], [address] Phone: [phone]   Fax:  [phone]  Physical Therapy Treatment  Patient Details  Name: Brenda Arnold MRN: [id number] Date of Birth: 1956/04/20 Referring Provider (PT): Courtney Heys, MD   Encounter Date: 10/05/2019  PT End of Session - 10/06/19 0831    Visit Number  4    Number of Visits  12    Date for PT Re-Evaluation  11/04/19    Authorization Type  MCR/BCBS    PT Start Time  1746    PT Stop Time  [id number]    PT Time Calculation (min)  44 min    Equipment Utilized During Treatment  Other (comment);Gait belt   min guard to S prn   Activity Tolerance  Patient tolerated treatment well;Patient limited by pain   limited due to low BP - pt asymptomatic and reporting no dizziness/lightheadedness   Behavior During Therapy  [facility] for tasks assessed/performed       Past Medical History:  Diagnosis Date  . Anemia   . Arthritis   . Asthma   . Complication of anesthesia    difficulty with getting oxygen saturation up  . Diabetes mellitus   . Dyslipidemia   . Epistaxis 11/05/2012  . ESRD (end stage renal disease) on dialysis [facility])    "MWF; [facility]" (09/15/2018)  . History of nuclear stress test    Myoview 5/18: EF 68, no infarct or ischemia, low risk  . HTN (hypertension)   . Hyperlipidemia   . Hyperparathyroidism due to renal insufficiency ([place])   . Obesity    s/p panniculectomy  . Paroxysmal A-fib (HCC)    a. chronic coumadin;  b. 12/2009 Echo: EF 60-65%, Gr 1 DD.  Marland Kitchen PCOS (polycystic ovarian syndrome)   . Pericarditis 08/2019  . Pneumonia   . Seasonal allergies   . Sleep apnea    a. not using CPAP, last study  >8 yrs  . Vitamin D deficiency     Past Surgical History:  Procedure Laterality Date  . ABDOMINAL HYSTERECTOMY     with panniculctomy  . AV FISTULA PLACEMENT  09/02/2012   Procedure: ARTERIOVENOUS (AV) FISTULA CREATION;  Surgeon: Elam Dutch, MD;  Location: [facility] OR;  Service: Vascular;  Laterality: Left;  Creation of Left Radial-Cephalic Fistula   . BREAST SURGERY     Biopsy right breast  . COLONOSCOPY W/ BIOPSIES AND POLYPECTOMY    . DILATION AND CURETTAGE OF UTERUS    . KNEE ARTHROSCOPY Right   . REVISON OF ARTERIOVENOUS FISTULA Left 04/27/2014   Procedure: REVISON OF LEFT RADIAL-CEPHALIC ARTERIOVENOUS FISTULA;  Surgeon: Mal Misty, MD;  Location: [facility];  Service: Vascular;  Laterality: Left;  . TOTAL HIP ARTHROPLASTY Right 09/15/2018  . TOTAL HIP ARTHROPLASTY Right 09/15/2018   Procedure: RIGHT TOTAL HIP ARTHROPLASTY ANTERIOR APPROACH;  Surgeon: Leandrew Koyanagi, MD;  Location: [facility];  Service: Orthopedics;  Laterality: Right;  . UVULOPLASTY    . VIDEO ASSISTED THORACOSCOPY (VATS)/WEDGE RESECTION Right 08/31/2019   Procedure: VIDEO ASSISTED THORACOSCOPY/ DRAINAGE OF PERICARDIAL EFFUSION/ PERICARDIAL WINDOW/ ABORTED PERICARDIOCENTESIS ;  Surgeon: Lajuana Matte, MD;  Location: [facility];  Service: Thoracic;  Laterality: Right;    Vitals:   10/05/19 1754 10/05/19 1808 10/05/19 1818 10/05/19 1829  BP: 97/60 (!) 96/49 (!) 97/58 (!) 106/56  Pulse: 83 87 83 87    Subjective Assessment - 10/05/19 1748    Subjective  No falls. Had  dialysis yesterday and was feeling nauseous until 12. And now it is completely gone. Did a lot of cleaning this past week and wore herself out. Has not been doing her exercises the past couple of days.    Pertinent History  history of T2DM, PAF, ESRD- HD MWF, OA bilateral hips s/p R-THR and need of L-THR; recent hospitilzaton for pericarditis    Patient Stated Goals  get my endurance back    Currently in Pain?  No/denies    Pain Onset  More than a month ago                   10/05/19 0001  Ambulation/Gait  Ambulation/Gait Yes  Ambulation/Gait Assistance 5: Supervision  Ambulation/Gait Assistance Details Cues for upright posture during gait.   Ambulation Distance (Feet) 115 Feet   Assistive device Rolling walker  Gait Pattern Decreased step length - right;Decreased step length - left;Decreased hip/knee flexion - right;Decreased hip/knee flexion - left;Decreased dorsiflexion - right;Decreased dorsiflexion - left;Antalgic  Ambulation Surface Level;Indoor  Exercises  Exercises Other Exercises  Other Exercises  At edge of blue mat table 1 x 10 rep sit to stands with no UE support, cues for glute activation in standing and eccentric control when lowering. With BUE on chair: 2 x 10 reps heel raises, 2 x 10 reps toe raises. In // bars side stepping down and back 2 reps, added red theraband to distal thighs for hip ABD strengthening down and back 2 reps.Due to lower BP after standing exercises performed seated exercises: 3 lb weight on RLE, 2lb weight on LLE: 2 x 10 reps LAQs, 2 x 10 reps seated marching.  SciFit BLE and BUE for improved strength and endurance, level 2.5 for 4 minutes.                  PT Education - 10/05/19 1828    Education Details  PT also encouraged pt to notify MD if BP decr (pt is going to see cardiologist tomorrow) and pt stated dialysis center is monitoring BP.    Person(s) Educated  Patient    Methods  Explanation    Comprehension  Verbalized understanding          PT Long Term Goals - 09/23/19 2348      PT LONG TERM GOAL #1   Title  Pt will be I and compliant with HEP. (Target for all goals 6 weeks 11/04/19)    Status  New      PT LONG TERM GOAL #2   Title  Pt will improve 6 minute walk test to >700 ft to show improved endurance for community ambulation    Baseline  523    Status  New      PT LONG TERM GOAL #3   Title  Pt will improve gross leg strength to 5/5 MMT bilat tested in sitting.    Baseline  4+/5    Status  New      PT LONG TERM GOAL #4   Title  Pt will improve TUG to and 5TSTS test <14 seconds to show improved balance, gait, enduance.    Status  New            Plan - 10/06/19 [id number]    Clinical  Impression Statement  Focus of today's skilled session was LE strengthening and activity tolerance. Pt with low blood pressure readings throughout session today, especially after standing exercises - pt reporting feeling asymptomatic and does not feel lightheaded/dizzy throughout session.  States her BP readings are normally low and that it is being monitored at dialysis, has an appointment with cardiologist tomorrow. Remainder of session focused on seated LE strengthening with BP being closely monitored. Pt would continue to benfit from skilled PT to improve safety and endurance during functional activities.    Personal Factors and Comorbidities  Comorbidity 1;Comorbidity 2;Comorbidity 3+    Comorbidities  history of T2DM, PAF, ESRD- HD MWF, OA bilateral hips s/p R-THR and need of L-THR; who was admitted  to hospital on 08/25/2019    Examination-Activity Limitations  Bend;Carry;Lift;Locomotion Level;Squat;Stairs;Stand    Examination-Participation Restrictions  Church;Meal Prep;School;Community Activity;Driving;Laundry    Stability/Clinical Decision Making  Evolving/Moderate complexity    Rehab Potential  Good    PT Frequency  2x / week    PT Duration  6 weeks    PT Treatment/Interventions  ADLs/Self Care Home Management;Cryotherapy;Electrical Stimulation;Moist Heat;DME Instruction;Gait Scientist, forensic;Therapeutic activities;Therapeutic exercise;Balance training;Neuromuscular re-education;Manual techniques;Patient/family education;Passive range of motion    PT Next Visit Plan  progress endurance and overall strength as able.    PT Home Exercise Plan  Access Code: [email or web address]    Consulted and Agree with Plan of Care  Patient       Patient will benefit from skilled therapeutic intervention in order to improve the following deficits and impairments:  Abnormal gait, Cardiopulmonary status limiting activity, Decreased activity tolerance, Decreased balance, Decreased endurance, Decreased strength,  Difficulty walking  Visit Diagnosis: Other abnormalities of gait and mobility  Muscle weakness (generalized)     Problem List Patient Active Problem List   Diagnosis Date Noted  . ESRD on dialysis ([place])   . Physical debility 09/09/2019  . Pericardial effusion   . SOB (shortness of breath)   . Leukocytosis   . Anemia of chronic disease   . PAF (paroxysmal atrial fibrillation) ([place])   . Postoperative pain   . Cardiac tamponade   . Chest pain at rest 08/26/2019  . Tachycardia 08/26/2019  . Pericarditis 08/26/2019  . Angina pectoris, unspecified ([place]) 08/25/2019  . CAD (coronary artery disease) 11/05/2018  . A-fib ([place]) 11/05/2018  . Atrial fibrillation with RVR ([place])   . Hypotension 11/04/2018  . ESRD on hemodialysis ([place]) 11/04/2018  . DVT (deep venous thrombosis) ([place]) 11/03/2018  . Primary osteoarthritis of right hip 09/15/2018  . Status post total replacement of right hip 09/15/2018  . Varicose veins of bilateral lower extremities with other complications 0000000  . Anticoagulation goal of INR 2 to 3 09/30/2014  . Pain in lower limb 05/02/2014  . Onychomycosis due to dermatophyte 04/06/2013  . Pain in joint, ankle and foot 04/06/2013  . Bradycardia 11/06/2012  . Left-sided epistaxis 11/05/2012  . DM (diabetes mellitus) ([place]) 11/05/2012  . OSA (obstructive sleep apnea) 11/05/2012  . Acute posterior epistaxis 11/05/2012  . CKD (chronic kidney disease) 11/05/2012  . End stage renal disease ([place]) 06/25/2012  . Type 2 diabetes mellitus with complication, without long-term current use of insulin ([place]) 12/20/2009  . OBESITY 12/20/2009  . OBESITY-MORBID (>100') 12/20/2009  . Essential hypertension 12/20/2009  . Paroxysmal atrial fibrillation ([place]) 12/20/2009  . Chest pain 12/20/2009  . ABNORMAL CV (STRESS) TEST 12/20/2009    Arliss Journey, PT, DPT  10/06/2019, 8:36 AM  [facility] [address] [place], [place], [address] Phone: [phone]   Fax:  [phone]  Name: Brenda Arnold MRN: [id number] Date of Birth: 03-17-1956

## 2019-10-06 ENCOUNTER — Other Ambulatory Visit: Payer: Self-pay

## 2019-10-06 ENCOUNTER — Emergency Department (HOSPITAL_COMMUNITY): Payer: BC Managed Care – PPO

## 2019-10-06 ENCOUNTER — Encounter (HOSPITAL_COMMUNITY): Payer: Self-pay | Admitting: *Deleted

## 2019-10-06 ENCOUNTER — Ambulatory Visit: Payer: BC Managed Care – PPO | Admitting: Cardiology

## 2019-10-06 ENCOUNTER — Emergency Department (HOSPITAL_COMMUNITY)
Admission: EM | Admit: 2019-10-06 | Discharge: 2019-10-06 | Disposition: A | Discharge status: Home / Self Care | Payer: BC Managed Care – PPO | Attending: Emergency Medicine | Admitting: Emergency Medicine

## 2019-10-06 DIAGNOSIS — I319 Disease of pericardium, unspecified: Secondary | ICD-10-CM

## 2019-10-06 DIAGNOSIS — E1122 Type 2 diabetes mellitus with diabetic chronic kidney disease: Secondary | ICD-10-CM | POA: Insufficient documentation

## 2019-10-06 DIAGNOSIS — R0789 Other chest pain: Secondary | ICD-10-CM | POA: Diagnosis not present

## 2019-10-06 DIAGNOSIS — Z992 Dependence on renal dialysis: Secondary | ICD-10-CM | POA: Insufficient documentation

## 2019-10-06 DIAGNOSIS — I25119 Atherosclerotic heart disease of native coronary artery with unspecified angina pectoris: Secondary | ICD-10-CM | POA: Diagnosis not present

## 2019-10-06 DIAGNOSIS — Z96641 Presence of right artificial hip joint: Secondary | ICD-10-CM | POA: Insufficient documentation

## 2019-10-06 DIAGNOSIS — R0781 Pleurodynia: Secondary | ICD-10-CM | POA: Insufficient documentation

## 2019-10-06 DIAGNOSIS — N186 End stage renal disease: Secondary | ICD-10-CM | POA: Diagnosis not present

## 2019-10-06 DIAGNOSIS — J45909 Unspecified asthma, uncomplicated: Secondary | ICD-10-CM | POA: Insufficient documentation

## 2019-10-06 DIAGNOSIS — I120 Hypertensive chronic kidney disease with stage 5 chronic kidney disease or end stage renal disease: Secondary | ICD-10-CM | POA: Diagnosis not present

## 2019-10-06 DIAGNOSIS — Z79899 Other long term (current) drug therapy: Secondary | ICD-10-CM | POA: Insufficient documentation

## 2019-10-06 DIAGNOSIS — R079 Chest pain, unspecified: Secondary | ICD-10-CM | POA: Diagnosis not present

## 2019-10-06 DIAGNOSIS — Z7901 Long term (current) use of anticoagulants: Secondary | ICD-10-CM | POA: Diagnosis not present

## 2019-10-06 LAB — TROPONIN I (HIGH SENSITIVITY)
Troponin I (High Sensitivity): 7 ng/L (ref <18)
Troponin I (High Sensitivity): 8 ng/L (ref <18)

## 2019-10-06 LAB — CBC
HCT: 35.7 % — ABNORMAL LOW (ref 36.0–46.0)
Hemoglobin: 10.8 g/dL — ABNORMAL LOW (ref 12.0–15.0)
MCH: 30.8 pg (ref 26.0–34.0)
MCHC: 30.3 g/dL (ref 30.0–36.0)
MCV: 101.7 fL — ABNORMAL HIGH (ref 80.0–100.0)
Platelets: 257 10*3/uL (ref 150–400)
RBC: 3.51 MIL/uL — ABNORMAL LOW (ref 3.87–5.11)
RDW: 16.2 % — ABNORMAL HIGH (ref 11.5–15.5)
WBC: 11.1 10*3/uL — ABNORMAL HIGH (ref 4.0–10.5)
nRBC: 0 % (ref 0.0–0.2)

## 2019-10-06 LAB — BASIC METABOLIC PANEL
Anion gap: 17 — ABNORMAL HIGH (ref 5–15)
BUN: 49 mg/dL — ABNORMAL HIGH (ref 8–23)
CO2: 27 mmol/L (ref 22–32)
Calcium: 8.3 mg/dL — ABNORMAL LOW (ref 8.9–10.3)
Chloride: 99 mmol/L (ref 98–111)
Creatinine, Ser: 10.41 mg/dL — ABNORMAL HIGH (ref 0.44–1.00)
GFR calc Af Amer: 4 mL/min — ABNORMAL LOW (ref >60)
GFR calc non Af Amer: 4 mL/min — ABNORMAL LOW (ref >60)
Glucose, Bld: 95 mg/dL (ref 70–99)
Potassium: 3.9 mmol/L (ref 3.5–5.1)
Sodium: 143 mmol/L (ref 135–145)

## 2019-10-06 MED ORDER — SODIUM CHLORIDE 0.9% FLUSH: 3.0000 mL | Freq: Once | INTRAVENOUS | Status: DC

## 2019-10-06 NOTE — ED Notes (Signed)
Patient Alert and oriented to baseline. Stable and ambulatory to baseline. Patient verbalized understanding of the discharge instructions.  Patient belongings were taken by the patient.   

## 2019-10-06 NOTE — Discharge Instructions (Signed)
You were seen in the ED for chest pain and nausea. We got labwork and a chest x-ray. Your lab work showed a slight increase in your white blood count. Your chest x-ray showed the known pleural effusions. The effusion on the left is slightly smaller and the one on the right is stable.   Your pain is likely secondary to your known pleural effusions, pericarditis and from the right sided chest surgery you underwent. Continue colchicine and as needed tylenol.   If your chest pain worsens or you develop shortness of breath or trouble breathing, please come back to the ED.   Please follow up with a cardiologist, follow up with your regular doctors and you are OK to go to dialysis tomorrow.

## 2019-10-06 NOTE — ED Triage Notes (Signed)
Pt discharged on 10/17 after 27 day admission in which she had a pleural effusion at that time. Reports intermittent chest pain since being discharged. Has been taking tramadol and asa with improvement of pain for a couple days then pain has returned. Pt was at dialysis this morning, had the needles placed for access and pt felt nauseous. EMS called for transport before pt started dialysis

## 2019-10-06 NOTE — ED Provider Notes (Signed)
[facility] EMERGENCY DEPARTMENT Provider Note   CSN: [id number] Arrival date & time: 10/06/19  [id number]     History   Chief Complaint Chief Complaint  Patient presents with   Chest Pain    HPI Brenda Arnold is a 63 y.o. female.     Brenda Arnold is a 63 yo F with a hx significant for ESRD on HD (MWF), OSA and  paroxysmal afib who was recently discharged after admission for pericarditis complicated by cardiogenic shock from pericardial tamponade s/p emergent right VATS pericardial window here with right sided pleuritic chest pain and nausea. She reports migratory, intermittent pleuritic chest pain since discharge from the hospital. She denies that her sx worsen with exertion or improve with rest. Her chest is not tender to palpation. She is taking MWF colchicine for her pericarditis and tylenol prn which has previously completed relieved her chest pain. She reports the pain started again last night after not having it for around a week. She continued to have it when she went to dialysis today where she also developed nausea at which point they sent her to the ED. She reports feeling nauseous often at dialysis. She also reports dry heaving which she does experience once in a blue moon at dialysis. She denies chest pain or pleuritic pain now. She denies nausea now. She denies fever, chills, sore throat, rhinorrhea, blurry vision, abdominal pain, diarrhea, rashes, falls, muscle pains and LOC. She denies sick contacts. She has no other complaints or concerns and feels well currently.      Past Medical History:  Diagnosis Date   Anemia    Arthritis    Asthma    Complication of anesthesia    difficulty with getting oxygen saturation up   Diabetes mellitus    Dyslipidemia    Epistaxis 11/05/2012   ESRD (end stage renal disease) on dialysis [facility])    "MWF; Jene[facility] Rinks" (09/15/2018)   History of nuclear stress test    Myoview 5/18: EF 68, no infarct or  ischemia, low risk   HTN (hypertension)    Hyperlipidemia    Hyperparathyroidism due to renal insufficiency (HCC)    Obesity    s/p panniculectomy   Paroxysmal A-fib ([place])    a. chronic coumadin;  b. 12/2009 Echo: EF 60-65%, Gr 1 DD.   PCOS (polycystic ovarian syndrome)    Pericarditis 08/2019   Pneumonia    Seasonal allergies    Sleep apnea    a. not using CPAP, last study  >8 yrs   Vitamin D deficiency     Patient Active Problem List   Diagnosis Date Noted   ESRD on dialysis [facility])    Physical debility 09/09/2019   Pericardial effusion    SOB (shortness of breath)    Leukocytosis    Anemia of chronic disease    PAF (paroxysmal atrial fibrillation) (HCC)    Postoperative pain    Cardiac tamponade    Chest pain at rest 08/26/2019   Tachycardia 08/26/2019   Pericarditis 08/26/2019   Angina pectoris, unspecified ([place]) 08/25/2019   CAD (coronary artery disease) 11/05/2018   A-fib ([place]) 11/05/2018   Atrial fibrillation with RVR ([place])    Hypotension 11/04/2018   ESRD on hemodialysis ([place]) 11/04/2018   DVT (deep venous thrombosis) ([place]) 11/03/2018   Primary osteoarthritis of right hip 09/15/2018   Status post total replacement of right hip 09/15/2018   Varicose veins of bilateral lower extremities with other complications 0000000   Anticoagulation goal  of INR 2 to 3 09/30/2014   Pain in lower limb 05/02/2014   Onychomycosis due to dermatophyte 04/06/2013   Pain in joint, ankle and foot 04/06/2013   Bradycardia 11/06/2012   Left-sided epistaxis 11/05/2012   DM (diabetes mellitus) ([place]) 11/05/2012   OSA (obstructive sleep apnea) 11/05/2012   Acute posterior epistaxis 11/05/2012   CKD (chronic kidney disease) 11/05/2012   End stage renal disease ([place]) 06/25/2012   Type 2 diabetes mellitus with complication, without long-term current use of insulin ([place]) 12/20/2009   OBESITY 12/20/2009   OBESITY-MORBID (>100') 12/20/2009    Essential hypertension 12/20/2009   Paroxysmal atrial fibrillation ([place]) 12/20/2009   Chest pain 12/20/2009   ABNORMAL CV (STRESS) TEST 12/20/2009    Past Surgical History:  Procedure Laterality Date   ABDOMINAL HYSTERECTOMY     with panniculctomy   AV FISTULA PLACEMENT  09/02/2012   Procedure: ARTERIOVENOUS (AV) FISTULA CREATION;  Surgeon: Elam Dutch, MD;  Location: [facility];  Service: Vascular;  Laterality: Left;  Creation of Left Radial-Cephalic Fistula    BREAST SURGERY     Biopsy right breast   COLONOSCOPY W/ BIOPSIES AND POLYPECTOMY     DILATION AND CURETTAGE OF UTERUS     KNEE ARTHROSCOPY Right    REVISON OF ARTERIOVENOUS FISTULA Left 04/27/2014   Procedure: REVISON OF LEFT RADIAL-CEPHALIC ARTERIOVENOUS FISTULA;  Surgeon: Mal Misty, MD;  Location: [facility] OR;  Service: Vascular;  Laterality: Left;   TOTAL HIP ARTHROPLASTY Right 09/15/2018   TOTAL HIP ARTHROPLASTY Right 09/15/2018   Procedure: RIGHT TOTAL HIP ARTHROPLASTY ANTERIOR APPROACH;  Surgeon: Leandrew Koyanagi, MD;  Location: [facility];  Service: Orthopedics;  Laterality: Right;   UVULOPLASTY     VIDEO ASSISTED THORACOSCOPY (VATS)/WEDGE RESECTION Right 08/31/2019   Procedure: VIDEO ASSISTED THORACOSCOPY/ DRAINAGE OF PERICARDIAL EFFUSION/ PERICARDIAL WINDOW/ ABORTED PERICARDIOCENTESIS ;  Surgeon: Lajuana Matte, MD;  Location: [facility] OR;  Service: Thoracic;  Laterality: Right;     OB History   No obstetric history on file.      Home Medications    Prior to Admission medications   Medication Sig Start Date End Date Taking? Authorizing Provider  amiodarone (PACERONE) 200 MG tablet Take 1 tablet (200 mg total) by mouth daily. 09/17/19  Yes Love, Ivan Anchors, PA-C  calcitRIOL (ROCALTROL) 0.25 MCG capsule Take 3 capsules (0.75 mcg total) by mouth every Monday, Wednesday, and Friday with hemodialysis. 09/17/19  Yes Love, Ivan Anchors, PA-C  colchicine 0.6 MG tablet Take 0.5 tablets (0.3 mg total) by mouth every Monday,  Wednesday, and Friday with hemodialysis. 09/17/19  Yes Love, Ivan Anchors, PA-C  docusate sodium (COLACE) 100 MG capsule Take 1 capsule (100 mg total) by mouth 2 (two) times daily. 09/17/19  Yes Love, Ivan Anchors, PA-C  ELIQUIS 5 MG TABS tablet Take 5 mg by mouth 2 (two) times daily.  12/19/18  Yes [provider]  gabapentin (NEURONTIN) 100 MG capsule Take 100 mg by mouth at bedtime. 05/14/17  Yes [provider]  Menthol-Methyl Salicylate (MUSCLE RUB) 10-15 % CREA Apply 1 application topically as needed for muscle pain. 09/17/19  Yes Love, Ivan Anchors, PA-C  midodrine (PROAMATINE) 10 MG tablet Take 1 tablet (10 mg total) by mouth 3 (three) times daily with meals. 09/09/19  Yes Dessa Phi, DO  multivitamin (RENA-VIT) TABS tablet Take 1 tablet by mouth at bedtime. 09/17/19  Yes Love, Ivan Anchors, PA-C  sucroferric oxyhydroxide (VELPHORO) 500 MG chewable tablet Chew 1,000 mg by mouth 3 (three) times  daily. 09/28/19  Yes [provider]    Family History Family History  Problem Relation Age of Onset   Lung cancer Father        died @ 70   Hypertension Mother    Diabetes Brother    Kidney disease Brother     Social History Social History   Tobacco Use   Smoking status: Never Smoker   Smokeless tobacco: Never Used  Substance Use Topics   Alcohol use: No    Alcohol/week: 0.0 standard drinks   Drug use: No     Allergies   Iodine, Shellfish allergy, and Norvasc [amlodipine]   Review of Systems Review of Systems  Constitutional: Negative for chills and fever.  HENT: Negative for congestion, rhinorrhea, sore throat and trouble swallowing.   Eyes: Negative for visual disturbance.  Respiratory: Negative for shortness of breath.   Cardiovascular: Positive for chest pain (right sided pleuritic pain).  Gastrointestinal: Positive for nausea. Negative for abdominal pain, diarrhea and vomiting.  Genitourinary:       Does not make urine  Musculoskeletal: Negative  for myalgias.       No falls  Skin: Negative for rash.  Neurological: Negative for syncope and headaches.   Physical Exam Updated Vital Signs BP 119/65    Pulse 70    Temp 98.4 F (36.9 C) (Oral)    Resp 18    Ht 5\' 6"  (1.676 m)    Wt 96.7 kg    SpO2 100%    BMI 34.41 kg/m   Physical Exam Vitals signs and nursing note reviewed.  Constitutional:      General: She is not in acute distress.    Appearance: She is well-developed.  HENT:     Head: Normocephalic and atraumatic.  Eyes:     Conjunctiva/sclera: Conjunctivae normal.  Neck:     Musculoskeletal: Neck supple.  Cardiovascular:     Rate and Rhythm: Normal rate and regular rhythm.     Heart sounds: Normal heart sounds. No murmur.  Pulmonary:     Effort: Pulmonary effort is normal. No respiratory distress.     Breath sounds: Normal breath sounds.  Chest:     Chest wall: No mass, deformity, tenderness or edema.  Abdominal:     General: Bowel sounds are normal.     Palpations: Abdomen is soft.     Tenderness: There is no abdominal tenderness.  Musculoskeletal:     Right lower leg: No edema.  Skin:    General: Skin is warm and dry.  Neurological:     General: No focal deficit present.     Mental Status: She is alert and oriented to person, place, and time.  Psychiatric:        Mood and Affect: Mood normal.    ED Treatments / Results  Labs (all labs ordered are listed, but only abnormal results are displayed) Labs Reviewed  BASIC METABOLIC PANEL - Abnormal; Notable for the following components:      Result Value   BUN 49 (*)    Creatinine, Ser 10.41 (*)    Calcium 8.3 (*)    GFR calc non Af Amer 4 (*)    GFR calc Af Amer 4 (*)    Anion gap 17 (*)    All other components within normal limits  CBC - Abnormal; Notable for the following components:   WBC 11.1 (*)    RBC 3.51 (*)    Hemoglobin 10.8 (*)    HCT 35.7 (*)  MCV 101.7 (*)    RDW 16.2 (*)    All other components within normal limits  TROPONIN I  (HIGH SENSITIVITY)  TROPONIN I (HIGH SENSITIVITY)    EKG EKG Interpretation  Date/Time:  Wednesday October 06 2019 06:33:58 EST Ventricular Rate:  71 PR Interval:  178 QRS Duration: 86 QT Interval:  446 QTC Calculation: 484 R Axis:   -76 Text Interpretation: Normal sinus rhythm Left axis deviation Low voltage QRS Inferior infarct , age undetermined Cannot rule out Anterior infarct , age undetermined Abnormal ECG No significant change since last tracing Confirmed by Theotis Burrow [phone]) on 10/06/2019 9:35:05 AM   Radiology Dg Chest 2 View  Result Date: 10/06/2019 CLINICAL DATA:  Chest pain and pleural effusion. Pericardial effusion. EXAM: CHEST - 2 VIEW COMPARISON:  09/15/2019 FINDINGS: Lungs are adequately inflated demonstrate a stable small right pleural effusion likely with associated basilar atelectasis. There is interval improvement in a small to moderate size left pleural effusion likely with associated basilar atelectasis. Likely stable loculated component of pleural fluid over the lateral right base. Cardiomediastinal silhouette and remainder of the exam is unchanged. IMPRESSION: Interval improvement and small to moderate size left pleural effusion likely with associated basilar atelectasis. Stable small right pleural effusion likely with associated basilar atelectasis. Electronically Signed   By: Marin Olp M.D.   On: 10/06/2019 07:08    Procedures Procedures (including critical care time)  Medications Ordered in ED Medications  sodium chloride flush (NS) 0.9 % injection 3 mL (has no administration in time range)     Initial Impression / Assessment and Plan / ED Course  I have reviewed the triage vital signs and the nursing notes.  Pertinent labs & imaging results that were available during my care of the patient were reviewed by me and considered in my medical decision making (see chart for details).    Brenda Arnold is a 63 yo F with a hx significant for ESRD on HD  (MWF), OSA and  paroxysmal afib who was recently discharged after admission for pericarditis complicated by cardiogenic shock from pericardial tamponade s/p emergent right VATS pericardial window here with right sided pleuritic chest pain and nausea with stable vitals, a slight leukocytosis and an x-ray showing stable to improving known pleural effusions since her prior hospitalization thought to be secondary to volume overload.   Her chest pain with nausea is somewhat concerning for ACS but EKG and trop are unremarkable. Patient reports occasionally feeling nauseous at dialysis with less frequent bouts of dry heaving like today and chest pain is not typical of angina, is worse with inspiration, and is not worsened with exertion or improved with rest, making ACS unlikely.   Unlikely a PE as patient satting well on room air, has no complaints of SOB and isn't tachycardic. She is also on chronic anticoagulation.   X-ray not concerning for pneumonia although patient does have a small white count which could be secondary to demarginization.   Aortic dissection unlikely as patient does not have ongoing pain, it does not radiate to the back, she is normotensive and has had no loss of consciousness.   Pain could also be post operative pain or pleurisy as it is located on the right side where her VATS pericardial window was performed and drains were placed.   Most likely Brenda Arnold's chest pain is secondary to stable known right pleural effusion vs known pericarditis. It has since resolved without any intervention. Patient not nauseous here in the ED.  Final Clinical Impressions(s) / ED Diagnoses   Final diagnoses:  Pleuritic chest pain    ED Discharge Orders    None       Al Decant, MD 10/06/19 [place], Wenda Overland, MD 10/07/19 [phone]

## 2019-10-06 NOTE — Progress Notes (Deleted)
Cardiology Office Note:    Date:  10/06/2019   ID:  Brenda Arnold, DOB 02-07-1956, MRN [id number]  PCP:  Kelton Pillar, MD  Cardiologist:  Fransico Him, MD  Referring MD: Kelton Pillar, MD   No chief complaint on file. ***  History of Present Illness:    Brenda Arnold is a 63 y.o. female with a past medical history significant for end-stage renal disease on HD, paroxysmal atrial fibrillation, diet controlled diabetes, hypertension, OSA, and obesity    Patient presented to [facility] on 08/25/2019 with pleuritic chest pain, elevated troponins, diffuse ST elevation on EKG all consistent with acute pericarditis.  She was treated with colchicine.  Initial echocardiogram showed trivial effusion.  On 08/31/2019 she developed hypotension and repeat echocardiogram showed large pleural effusion with findings concerning for tamponade.  She underwent pericardial window by Dr. Kipp Brood on 08/31/2019.  Cultures were negative.  Cytology showed no malignant cells.  Follow-up echocardiogram showed small effusion, improved from prior and no tamponade.  Plan is to continue colchicine for 3 months.  Given that she is on Eliquis high-dose aspirin was not used.  No NSAIDs secondary to ESRD.  The patient had recurrent atrial fibrillation with RVR and was loaded with amiodarone.  She attained sinus rhythm.  Eliquis was held surrounding her surgery but was resumed prior to discharge.  She has a history of hypotension following hemodialysis and is treated with midodrine.  Her lipids are elevated with LDL of 115 but the patient has refused a statin.  She has diet-controlled diabetes with A1c of 4.9.  Nephrology plans to check CBC with platelets at least twice monthly at dialysis to ensure no agranulocytosis.  The patient was discharged to [facility] and was discharged on 09/18/2019.  She was seen at the cardiothoracic surgery office on 09/24/2019 and felt to be recovering well.    Cardiac studies    Limited echo 08/31/19: 1. Left ventricular ejection fraction, by visual estimation, is 55 to 60%. The left ventricle has normal function. There is no left ventricular hypertrophy. 2. Global right ventricle was not well visualized.The right ventricular size is not well visualized. Right vetricular wall thickness was not assessed. 3. Left atrial size was normal. 4. Right atrial size was not well visualized. 5. Large pericardial effusion. 6. The mitral valve was not assessed. Not assessed mitral valve regurgitation. 7. The tricuspid valve is not assessed. Tricuspid valve regurgitation was not assessed by color flow Doppler. 8. The aortic valve was not assessed Aortic valve regurgitation was not assessed by color flow Doppler. 9. The pulmonic valve was not well visualized. Pulmonic valve regurgitation was not assessed by color flow Doppler. 10. Aortic root could not be assessed. 11. Limited study for pericardial effusion; large pericardial effusion; no RV diastolic collapse; IVC dilated; cannot R/O early tamponade; echo is larger compared to 08/28/19. 12. The interatrial septum was not assessed.  Limited Echo 08/28/19 1. Left ventricular ejection fraction, by visual estimation, is 60 to 65%. The left ventricle has normal function. Normal left ventricular size. There is no left ventricular hypertrophy. 2. Global right ventricle has normal systolic function.The right ventricular size is normal. No increase in right ventricular wall thickness. 3. Left atrial size was normal. 4. Right atrial size was normal. 5. Moderate pericardial effusion. 6. The pericardial effusion is circumferential. 7. The mitral valve is normal in structure. No evidence of mitral valve regurgitation. No evidence of mitral stenosis. 8. The tricuspid valve is normal in structure. Tricuspid valve regurgitation  is trivial. 9. The aortic valve is normal in structure. Aortic valve regurgitation was not visualized by  color flow Doppler. Structurally normal aortic valve, with no evidence of sclerosis or stenosis. 10. The pulmonic valve was normal in structure. Pulmonic valve regurgitation is not visualized by color flow Doppler. 11. Normal pulmonary artery systolic pressure. 12. The inferior vena cava is normal in size with greater than 50% respiratory variability, suggesting right atrial pressure of 3 mmHg. 13. Compared to 08/26/2019, the pericardial effusion appears larger, but there is no evidence of tamponade.  Echo 08/26/19 1. Left ventricular ejection fraction, by visual estimation, is 60 to 65%. The left ventricle has normal function. Normal left ventricular size. There is mildly increased left ventricular hypertrophy. 2. Left ventricular diastolic Doppler parameters are consistent with impaired relaxation pattern of LV diastolic filling. 3. The aortic valve is tricuspid Aortic valve regurgitation was not visualized by color flow Doppler. Mild aortic valve sclerosis without stenosis. 4. Left atrial size was normal. 5. Right atrial size was normal. 6. Global right ventricle has normal systolic function.The right ventricular size is normal. No increase in right ventricular wall thickness. 7. Trivial pericardial effusion is present. 8. The tricuspid valve is normal in structure. Tricuspid valve regurgitation was not visualized by color flow Doppler. 9. The mitral valve is normal in structure. No evidence of mitral valve regurgitation. No evidence of mitral stenosis. 10. TR signal is inadequate for assessing pulmonary artery systolic pressure. 11. The inferior vena cava is normal in size with greater than 50% respiratory variability, suggesting right atrial pressure of 3 mmHg.  Echo 10/2: LVEF 65-70%.  RV poorly visualized.  Small pericardial effusion, significantly improved from prior.  Moderate pleural effusion.  No tamponade.    Past Medical History:  Diagnosis Date  . Anemia   .  Arthritis   . Asthma   . Complication of anesthesia    difficulty with getting oxygen saturation up  . Diabetes mellitus   . Dyslipidemia   . Epistaxis 11/05/2012  . ESRD (end stage renal disease) on dialysis [facility])    "MWF; [facility] Rinks" (09/15/2018)  . History of nuclear stress test    Myoview 5/18: EF 68, no infarct or ischemia, low risk  . HTN (hypertension)   . Hyperlipidemia   . Hyperparathyroidism due to renal insufficiency ([place])   . Obesity    s/p panniculectomy  . Paroxysmal A-fib (HCC)    a. chronic coumadin;  b. 12/2009 Echo: EF 60-65%, Gr 1 DD.  Marland Kitchen PCOS (polycystic ovarian syndrome)   . Pericarditis 08/2019  . Pneumonia   . Seasonal allergies   . Sleep apnea    a. not using CPAP, last study  >8 yrs  . Vitamin D deficiency     Past Surgical History:  Procedure Laterality Date  . ABDOMINAL HYSTERECTOMY     with panniculctomy  . AV FISTULA PLACEMENT  09/02/2012   Procedure: ARTERIOVENOUS (AV) FISTULA CREATION;  Surgeon: Elam Dutch, MD;  Location[facility];  Service: Vascular;  Laterality: Left;  Creation of Left Radial-Cephalic Fistula   . BREAST SURGERY     Biopsy right breast  . COLONOSCOPY W/ BIOPSIES AND POLYPECTOMY    . DILATION AND CURETTAGE OF UTERUS    . KNEE ARTHROSCOPY Right   . REVISON OF ARTERIOVENOUS FISTULA Left 04/27/2014   Procedure: REVISON OF LEFT RADIAL-CEPHALIC ARTERIOVENOUS FISTULA;  Surgeon: Mal Misty, MD;  Location: [place];  Service: Vascular;  Laterality: Left;  . TOTAL HIP ARTHROPLASTY  Right 09/15/2018  . TOTAL HIP ARTHROPLASTY Right 09/15/2018   Procedure: RIGHT TOTAL HIP ARTHROPLASTY ANTERIOR APPROACH;  Surgeon: Leandrew Koyanagi, MD;  Location: [place];  Service: Orthopedics;  Laterality: Right;  . UVULOPLASTY    . VIDEO ASSISTED THORACOSCOPY (VATS)/WEDGE RESECTION Right 08/31/2019   Procedure: VIDEO ASSISTED THORACOSCOPY/ DRAINAGE OF PERICARDIAL EFFUSION/ PERICARDIAL WINDOW/ ABORTED PERICARDIOCENTESIS ;  Surgeon: Lajuana Matte, MD;   Location: [place] OR;  Service: Thoracic;  Laterality: Right;    Current Medications: No outpatient medications have been marked as taking for the 10/06/19 encounter (Appointment) with Daune Perch, NP.     Allergies:   Iodine, Shellfish allergy, and Norvasc [amlodipine]   Social History   Socioeconomic History  . Marital status: Single    Spouse name: Not on file  . Number of children: Not on file  . Years of education: Not on file  . Highest education level: Not on file  Occupational History  . Occupation: Dance movement psychotherapist.    Employer: [employer]  Social Needs  . Financial resource strain: Not on file  . Food insecurity    Worry: Not on file    Inability: Not on file  . Transportation needs    Medical: Not on file    Non-medical: Not on file  Tobacco Use  . Smoking status: Never Smoker  . Smokeless tobacco: Never Used  Substance and Sexual Activity  . Alcohol use: No    Alcohol/week: 0.0 standard drinks  . Drug use: No  . Sexual activity: Not on file  Lifestyle  . Physical activity    Days per week: Not on file    Minutes per session: Not on file  . Stress: Not on file  Relationships  . Social Herbalist on phone: Not on file    Gets together: Not on file    Attends religious service: Not on file    Active member of club or organization: Not on file    Attends meetings of clubs or organizations: Not on file    Relationship status: Not on file  Other Topics Concern  . Not on file  Social History Narrative   Lives in [place] alone. 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     Family History: The patient's family history includes Diabetes in her brother; Hypertension in her mother; Kidney disease in her brother; Lung cancer in her father. ROS:   Please see the history of present illness.     All other systems reviewed and are negative.   EKG:  EKG is *** ordered today.  The ekg ordered today demonstrates ***   Recent Labs: 08/30/2019: TSH 1.026 09/07/2019: Magnesium 2.0 09/13/2019: ALT 16 09/17/2019: BUN 33; Creatinine, Ser 8.43; Hemoglobin 9.4; Platelets 275; Potassium 3.9; Sodium 135   Recent Lipid Panel    Component Value Date/Time   CHOL 175 08/27/2019 0353   TRIG 83 08/27/2019 0353   HDL 43 08/27/2019 0353   CHOLHDL 4.1 08/27/2019 0353   VLDL 17 08/27/2019 0353   LDLCALC 115 (H) 08/27/2019 0353    Physical Exam:    VS:  There were no vitals taken for this visit.    Wt Readings from Last 6 Encounters:  09/30/19 215 lb 9.6 oz (97.8 kg)  09/24/19 217 lb (98.4 kg)  09/17/19 223 lb 5.2 oz (101.3 kg)  09/08/19 229 lb 4.5 oz (104 kg)  08/24/19 219 lb (99.3 kg)  11/09/18 224 lb 3.3 oz (101.7 kg)  Physical Exam***   ASSESSMENT:    1. Pericardial effusion   2. Other pericarditis, unspecified chronicity   3. Paroxysmal atrial fibrillation (HCC)   4. End stage renal disease ([place])   5. Hemodialysis-associated hypotension    PLAN:    In order of problems listed above:  Pericardial effusion with cardiac tamponade/acute pericarditis -Likely uremic pericardial effusion -Status post pericardial window on 08/31/2019 -Colchicine for 3 months, MWF with dialysis.  No NSAIDs or aspirin due to ESRD and on anticoagulation for A. fib.  Paroxysmal atrial fibrillation -Patient had atrial fibrillation with RVR while in the hospital and was started on amiodarone.  -Continues on Eliquis for stroke risk reduction  ESRD -Hemodialysis Monday-Wednesday-Friday per nephrology  Chronic hypotension -Patient has a history of hypotension after dialysis.  She continues on midodrine.   Medication Adjustments/Labs and Tests Ordered: Current medicines are reviewed at length with the patient today.  Concerns regarding medicines are outlined above. Labs and tests ordered and medication changes are outlined in the patient instructions below:  There are no Patient Instructions on file for this visit.    Signed, Daune Perch, NP  10/06/2019 6:50 AM    [place]

## 2019-10-06 NOTE — ED Notes (Signed)
ED Provider at bedside. 

## 2019-10-08 ENCOUNTER — Ambulatory Visit (INDEPENDENT_AMBULATORY_CARE_PROVIDER_SITE_OTHER): Payer: BC Managed Care – PPO | Admitting: Cardiology

## 2019-10-08 ENCOUNTER — Other Ambulatory Visit: Payer: Self-pay

## 2019-10-08 ENCOUNTER — Encounter: Payer: Self-pay | Admitting: Cardiology

## 2019-10-08 VITALS — BP 120/60 | HR 85 | Ht 66.0 in | Wt 212.0 lb

## 2019-10-08 DIAGNOSIS — I480 Paroxysmal atrial fibrillation: Secondary | ICD-10-CM | POA: Diagnosis not present

## 2019-10-08 DIAGNOSIS — N186 End stage renal disease: Secondary | ICD-10-CM

## 2019-10-08 DIAGNOSIS — Z992 Dependence on renal dialysis: Secondary | ICD-10-CM | POA: Diagnosis not present

## 2019-10-08 DIAGNOSIS — E785 Hyperlipidemia, unspecified: Secondary | ICD-10-CM

## 2019-10-08 DIAGNOSIS — I314 Cardiac tamponade: Secondary | ICD-10-CM | POA: Diagnosis not present

## 2019-10-08 DIAGNOSIS — G4733 Obstructive sleep apnea (adult) (pediatric): Secondary | ICD-10-CM

## 2019-10-08 DIAGNOSIS — N2581 Secondary hyperparathyroidism of renal origin: Secondary | ICD-10-CM | POA: Diagnosis not present

## 2019-10-08 DIAGNOSIS — E1169 Type 2 diabetes mellitus with other specified complication: Secondary | ICD-10-CM

## 2019-10-08 NOTE — Patient Instructions (Signed)
Medication Instructions:  Your physician recommends that you continue on your current medications as directed. Please refer to the Current Medication list given to you today.  *If you need a refill on your cardiac medications before your next appointment, please call your pharmacy*  Lab Work: None   If you have labs (blood work) drawn today and your tests are completely normal, you will receive your results only by: Marland Kitchen MyChart Message (if you have MyChart) OR . A paper copy in the mail If you have any lab test that is abnormal or we need to change your treatment, we will call you to review the results.  Testing/Procedures: None   Follow-Up: You are scheduled to see Dr. Radford Pax on 01/13/2020 @ 3:40 PM   Other Instructions

## 2019-10-08 NOTE — Progress Notes (Addendum)
Cardiology Office Note:    Date:  10/08/2019   ID:  RAJVI LIUZZO, DOB [birth year], MRN [id number]  PCP:  Kelton Pillar, MD  Cardiologist:  Fransico Him, MD  Referring MD: Kelton Pillar, MD   Chief Complaint  Patient presents with   Hospitalization Follow-up    pericarditis    History of Present Illness:    Brenda Arnold is a 63 y.o. female with a past medical history significant for hypertension, paroxysmal atrial fibrillation, end-stage renal disease on HD, and obesity.  She had a Myoview secondary to chest pain in the setting of atrial fibrillation 2011 that showed inferolateral lateral ischemia.  Heart catheterization was deferred and medical therapy was recommended.  She underwent Myoview stress test on 04/17/17 which showed no reversible ischemia.    She was last seen in the office on 07/24/2018 for preoperative clearance prior to hip surgery.  It was noted that she has mild hypotension with dialysis.  Cardiac medications have been discontinued.  She was in sinus rhythm.  Not on anticoagulation.    The patient was seen in the hospital in early October with symptoms consistent with acute pericarditis  (pleuritc chest pain, elevated inflammatory markers, diffuse ST elevation on EKG), treated with colchicine.  Initial TTE with trivial effusion.  On 9/29 developed hypotension and repeat TTE showed large effusion with findings concerning for tamponade.  S/p pericardial window with Dr Kipp Brood on 9/29.  Cultures no growth, cytology with no malignant cells. Continue colchicine with HD.  She developed atrial fibrillation with RVR and was started on amiodarone for rhythm control and risks for stroke risk reduction.  She was maintaining sinus rhythm at the time of discharge.  She went to inpatient rehab.  She went to the ED on 10/06/2019 with complaints of pleuritic chest pain.  There were no ischemic changes on EKG and troponins were normal.  She was not felt to be volume overloaded  and potassium was normal.  The patient is here today for follow-up.  She is still at rehab.  She had dialysis this morning. Her dry wt was low. Her blood pressure stays stable with the midorine. She is complaining of pain around the site of her chest tube. Chest pain with breathing has improved slowly since the hosptial, still feels it mildly with deep breath.   She also feels a few skipped beats, 3-4, in the evening when she lays down for bed. She feels like it is brief afib, she has no sustained palpitations.   No orthopnea or edema.   She has a tender spot that she noted ~3-4 days ago. She thought that it might be at the site of the pericardial drain.  On inspection the drain site is healing well with no redness, swelling or drainage.  The tender spot is actually just under the skin of her right breast several inches from the drain site and appears not to be related.  I advised her to watch this and if it continues she should see her primary care physician.  She may need breast imaging.   Cardiac studies   Limited echo 08/31/19: 1. Left ventricular ejection fraction, by visual estimation, is 55 to 60%. The left ventricle has normal function. There is no left ventricular hypertrophy. 2. Global right ventricle was not well visualized.The right ventricular size is not well visualized. Right vetricular wall thickness was not assessed. 3. Left atrial size was normal. 4. Right atrial size was not well visualized. 5. Large pericardial effusion. 6.  The mitral valve was not assessed. Not assessed mitral valve regurgitation. 7. The tricuspid valve is not assessed. Tricuspid valve regurgitation was not assessed by color flow Doppler. 8. The aortic valve was not assessed Aortic valve regurgitation was not assessed by color flow Doppler. 9. The pulmonic valve was not well visualized. Pulmonic valve regurgitation was not assessed by color flow Doppler. 10. Aortic root could not be assessed. 11.  Limited study for pericardial effusion; large pericardial effusion; no RV diastolic collapse; IVC dilated; cannot R/O early tamponade; echo is larger compared to 08/28/19. 12. The interatrial septum was not assessed.  Limited Echo 08/28/19 1. Left ventricular ejection fraction, by visual estimation, is 60 to 65%. The left ventricle has normal function. Normal left ventricular size. There is no left ventricular hypertrophy. 2. Global right ventricle has normal systolic function.The right ventricular size is normal. No increase in right ventricular wall thickness. 3. Left atrial size was normal. 4. Right atrial size was normal. 5. Moderate pericardial effusion. 6. The pericardial effusion is circumferential. 7. The mitral valve is normal in structure. No evidence of mitral valve regurgitation. No evidence of mitral stenosis. 8. The tricuspid valve is normal in structure. Tricuspid valve regurgitation is trivial. 9. The aortic valve is normal in structure. Aortic valve regurgitation was not visualized by color flow Doppler. Structurally normal aortic valve, with no evidence of sclerosis or stenosis. 10. The pulmonic valve was normal in structure. Pulmonic valve regurgitation is not visualized by color flow Doppler. 11. Normal pulmonary artery systolic pressure. 12. The inferior vena cava is normal in size with greater than 50% respiratory variability, suggesting right atrial pressure of 3 mmHg. 13. Compared to 08/26/2019, the pericardial effusion appears larger, but there is no evidence of tamponade.  Echo 08/26/19 1. Left ventricular ejection fraction, by visual estimation, is 60 to 65%. The left ventricle has normal function. Normal left ventricular size. There is mildly increased left ventricular hypertrophy. 2. Left ventricular diastolic Doppler parameters are consistent with impaired relaxation pattern of LV diastolic filling. 3. The aortic valve is tricuspid Aortic valve  regurgitation was not visualized by color flow Doppler. Mild aortic valve sclerosis without stenosis. 4. Left atrial size was normal. 5. Right atrial size was normal. 6. Global right ventricle has normal systolic function.The right ventricular size is normal. No increase in right ventricular wall thickness. 7. Trivial pericardial effusion is present. 8. The tricuspid valve is normal in structure. Tricuspid valve regurgitation was not visualized by color flow Doppler. 9. The mitral valve is normal in structure. No evidence of mitral valve regurgitation. No evidence of mitral stenosis. 10. TR signal is inadequate for assessing pulmonary artery systolic pressure. 11. The inferior vena cava is normal in size with greater than 50% respiratory variability, suggesting right atrial pressure of 3 mmHg.  Echo 09/03/2019: LVEF 65-70%.  RV poorly visualized.  Small pericardial effusion, significantly improved from prior.  Moderate pleural effusion.  No tamponade.  Myoview 04/17/17:  Nuclear stress EF: 68%.  There was no ST segment deviation noted during stress.  The study is normal.  This is a low risk study.  The left ventricular ejection fraction is hyperdynamic (>65%).  Normal pharmacologic nuclear study with no evidence of prior infarct or ischemia.  Myoview 1/11 EF 65, inferolateral ischemia *Study reviewed by cardiologist in office at follow-up appointment-felt to be low risk, medical therapy recommended*  Echo 1/11 Mild LVH, EF 60-60, normal wall motion, grade 1 diastolic dysfunction, trivial pericardial effusion  Past Medical History:  Diagnosis Date   Anemia    Arthritis    Asthma    Complication of anesthesia    difficulty with getting oxygen saturation up   Diabetes mellitus    Dyslipidemia    Epistaxis 11/05/2012   ESRD (end stage renal disease) on dialysis [facility])    "MWF; [facility]" (09/15/2018)   History of nuclear stress test    Myoview 5/18: EF  68, no infarct or ischemia, low risk   HTN (hypertension)    Hyperlipidemia    Hyperparathyroidism due to renal insufficiency (HCC)    Obesity    s/p panniculectomy   Paroxysmal A-fib ([place])    a. chronic coumadin;  b. 12/2009 Echo: EF 60-65%, Gr 1 DD.   PCOS (polycystic ovarian syndrome)    Pericarditis 08/2019   Pneumonia    Seasonal allergies    Sleep apnea    a. not using CPAP, last study  >8 yrs   Vitamin D deficiency     Past Surgical History:  Procedure Laterality Date   ABDOMINAL HYSTERECTOMY     with panniculctomy   AV FISTULA PLACEMENT  09/02/2012   Procedure: ARTERIOVENOUS (AV) FISTULA CREATION;  Surgeon: Elam Dutch, MD;  Location: [place];  Service: Vascular;  Laterality: Left;  Creation of Left Radial-Cephalic Fistula    BREAST SURGERY     Biopsy right breast   COLONOSCOPY W/ BIOPSIES AND POLYPECTOMY     DILATION AND CURETTAGE OF UTERUS     KNEE ARTHROSCOPY Right    REVISON OF ARTERIOVENOUS FISTULA Left 04/27/2014   Procedure: REVISON OF LEFT RADIAL-CEPHALIC ARTERIOVENOUS FISTULA;  Surgeon: Mal Misty, MD;  Location: [facility] OR;  Service: Vascular;  Laterality: Left;   TOTAL HIP ARTHROPLASTY Right 09/15/2018   TOTAL HIP ARTHROPLASTY Right 09/15/2018   Procedure: RIGHT TOTAL HIP ARTHROPLASTY ANTERIOR APPROACH;  Surgeon: Leandrew Koyanagi, MD;  Location: [place];  Service: Orthopedics;  Laterality: Right;   UVULOPLASTY     VIDEO ASSISTED THORACOSCOPY (VATS)/WEDGE RESECTION Right 08/31/2019   Procedure: VIDEO ASSISTED THORACOSCOPY/ DRAINAGE OF PERICARDIAL EFFUSION/ PERICARDIAL WINDOW/ ABORTED PERICARDIOCENTESIS ;  Surgeon: Lajuana Matte, MD;  Location: [facility] OR;  Service: Thoracic;  Laterality: Right;    Current Medications: Current Meds  Medication Sig   amiodarone (PACERONE) 200 MG tablet Take 1 tablet (200 mg total) by mouth daily.   calcitRIOL (ROCALTROL) 0.25 MCG capsule Take 3 capsules (0.75 mcg total) by mouth every Monday, Wednesday, and  Friday with hemodialysis.   colchicine 0.6 MG tablet Take 0.5 tablets (0.3 mg total) by mouth every Monday, Wednesday, and Friday with hemodialysis.   docusate sodium (COLACE) 100 MG capsule Take 1 capsule (100 mg total) by mouth 2 (two) times daily.   ELIQUIS 5 MG TABS tablet Take 5 mg by mouth 2 (two) times daily.    gabapentin (NEURONTIN) 100 MG capsule Take 100 mg by mouth at bedtime.   Menthol-Methyl Salicylate (MUSCLE RUB) 10-15 % CREA Apply 1 application topically as needed for muscle pain.   midodrine (PROAMATINE) 10 MG tablet Take 1 tablet (10 mg total) by mouth 3 (three) times daily with meals.   multivitamin (RENA-VIT) TABS tablet Take 1 tablet by mouth at bedtime.   sucroferric oxyhydroxide (VELPHORO) 500 MG chewable tablet Chew 1,000 mg by mouth 3 (three) times daily.     Allergies:   Iodine, Shellfish allergy, and Norvasc [amlodipine]   Social History   Socioeconomic History   Marital status: Single    Spouse name: Not on  file   Number of children: Not on file   Years of education: Not on file   Highest education level: Not on file  Occupational History   Occupation: CORRESPONDENT ADMIN.    Employer: [employer] resource strain: Not on file   Food insecurity    Worry: Not on file    Inability: Not on file   Transportation needs    Medical: Not on file    Non-medical: Not on file  Tobacco Use   Smoking status: Never Smoker   Smokeless tobacco: Never Used  Substance and Sexual Activity   Alcohol use: No    Alcohol/week: 0.0 standard drinks   Drug use: No   Sexual activity: Not on file  Lifestyle   Physical activity    Days per week: Not on file    Minutes per session: Not on file   Stress: Not on file  Relationships   Social connections    Talks on phone: Not on file    Gets together: Not on file    Attends religious service: Not on file    Active member of club or organization: Not on file     Attends meetings of clubs or organizations: Not on file    Relationship status: Not on file  Other Topics Concern   Not on file  Social History Narrative   Lives in [place] alone. 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     Family History: The patient's family history includes Diabetes in her brother; Hypertension in her mother; Kidney disease in her brother; Lung cancer in her father. ROS:   Please see the history of present illness.     All other systems reviewed and are negative.   EKG:  EKG is not ordered today.    Recent Labs: 08/30/2019: TSH 1.026 09/07/2019: Magnesium 2.0 09/13/2019: ALT 16 10/06/2019: BUN 49; Creatinine, Ser 10.41; Hemoglobin 10.8; Platelets 257; Potassium 3.9; Sodium 143   Recent Lipid Panel    Component Value Date/Time   CHOL 175 08/27/2019 0353   TRIG 83 08/27/2019 0353   HDL 43 08/27/2019 0353   CHOLHDL 4.1 08/27/2019 0353   VLDL 17 08/27/2019 0353   LDLCALC 115 (H) 08/27/2019 0353    Physical Exam:    VS:  BP 120/60    Pulse 85    Ht 5\' 6"  (1.676 m)    Wt 212 lb (96.2 kg)    SpO2 98%    BMI 34.22 kg/m     Wt Readings from Last 6 Encounters:  10/08/19 212 lb (96.2 kg)  10/06/19 213 lb 3 oz (96.7 kg)  09/30/19 215 lb 9.6 oz (97.8 kg)  09/24/19 217 lb (98.4 kg)  09/17/19 223 lb 5.2 oz (101.3 kg)  09/08/19 229 lb 4.5 oz (104 kg)     Physical Exam  Constitutional: She is oriented to person, place, and time. She appears well-developed and well-nourished. No distress.  HENT:  Head: Normocephalic and atraumatic.  Neck: Normal range of motion. Neck supple. No JVD present.  Cardiovascular: Normal rate, regular rhythm, normal heart sounds and intact distal pulses. Exam reveals no gallop and no friction rub.  No murmur heard. Pulmonary/Chest: Effort normal and breath sounds normal. No respiratory distress. She has no wheezes. She has no rales.  Pt with tender nodule in right breast tissue near adjacent to chest wall. No  redness or drainage. Tube site is a few inches from the nodule and is  healing well with no signs of infection.   Abdominal: Soft. Bowel sounds are normal.  Musculoskeletal: Normal range of motion.        General: No edema.     Comments: Left forearm AV fistula positive for bruit and thrill with dressing in place  Neurological: She is alert and oriented to person, place, and time.  Skin: Skin is warm and dry.  Psychiatric: She has a normal mood and affect. Her behavior is normal. Judgment and thought content normal.  Vitals reviewed.    ASSESSMENT:    1. Cardiac tamponade   2. Paroxysmal atrial fibrillation (HCC)   3. OSA (obstructive sleep apnea)   4. End stage renal disease ([place])   5. Type 2 diabetes mellitus with other specified complication, without long-term current use of insulin ([place])   6. Hyperlipidemia, unspecified hyperlipidemia type    PLAN:    In order of problems listed above:  Cardiac tamponade -Patient presented with symptoms consistent with pericarditis.  Treated with colchicine.  Initial TTE with trivial effusion.  On 9/29 developed hypotension and repeat TTE showed large effusion with findings concerning for tamponade.  S/p pericardial window with Dr Kipp Brood on 9/29.  Cultures NGTD, cytology with no malignant cells - Continues on colchicine with HD -Chest tenderness with deep breathing is slowly improving over time, now very mild and only with a very deep breath. -Patient does have a tender nodule that is near the site of the pericardial drain tube of the right chest but does not seem to be related.  Tenderness just began about 3-4 days ago.  There does not seem to be any redness or sign of infection.  She is not sure if it got pinched by her bra.  I advised her to watch it over the weekend and if it does not improve, contact her primary care physician.  It seems to be more in the breast than on the chest wall.   Paroxysmal atrial fibrillation -Maintaining sinus  rhythm, on amiodarone.   -She is anticoagulated with Eliquis for stroke risk reduction.  Obstructive sleep apnea -No longer on CPAP since lost over 76 pounds  End-stage renal disease -On hemodialysis -History of hypotension with HD, on midodrine.  She says the midodrine has really helped.  Type 2 diabetes -On no medications. -A1c 4.9  Hyperlipidemia -LDL 115, patient has refused statin  Medication Adjustments/Labs and Tests Ordered: Current medicines are reviewed at length with the patient today.  Concerns regarding medicines are outlined above. Labs and tests ordered and medication changes are outlined in the patient instructions below:  Patient Instructions  Medication Instructions:  Your physician recommends that you continue on your current medications as directed. Please refer to the Current Medication list given to you today.  *If you need a refill on your cardiac medications before your next appointment, please call your pharmacy*  Lab Work: None   If you have labs (blood work) drawn today and your tests are completely normal, you will receive your results only by:  [place] (if you have MyChart) OR  A paper copy in the mail If you have any lab test that is abnormal or we need to change your treatment, we will call you to review the results.  Testing/Procedures: None   Follow-Up: You are scheduled to see Dr. Radford Pax on 01/13/2020 @ 3:40 PM   Other Instructions      Signed, Daune Perch, NP  10/08/2019 5:35 PM    [place]

## 2019-10-10 ENCOUNTER — Other Ambulatory Visit: Payer: Self-pay | Admitting: Physical Medicine and Rehabilitation

## 2019-10-11 DIAGNOSIS — N186 End stage renal disease: Secondary | ICD-10-CM | POA: Diagnosis not present

## 2019-10-11 DIAGNOSIS — Z992 Dependence on renal dialysis: Secondary | ICD-10-CM | POA: Diagnosis not present

## 2019-10-11 DIAGNOSIS — N2581 Secondary hyperparathyroidism of renal origin: Secondary | ICD-10-CM | POA: Diagnosis not present

## 2019-10-12 ENCOUNTER — Other Ambulatory Visit: Payer: Self-pay

## 2019-10-12 ENCOUNTER — Ambulatory Visit: Payer: BC Managed Care – PPO | Admitting: Physical Therapy

## 2019-10-12 DIAGNOSIS — R5381 Other malaise: Secondary | ICD-10-CM | POA: Diagnosis not present

## 2019-10-12 DIAGNOSIS — R2689 Other abnormalities of gait and mobility: Secondary | ICD-10-CM | POA: Diagnosis not present

## 2019-10-12 DIAGNOSIS — M6281 Muscle weakness (generalized): Secondary | ICD-10-CM | POA: Diagnosis not present

## 2019-10-12 NOTE — Therapy (Signed)
[facility] [address] [place], [place], [address] Phone: [phone]   Fax:  [phone]  Physical Therapy Treatment  Patient Details  Name: Brenda Arnold MRN: [id number] Date of Birth: [birth year] Referring Provider (PT): Courtney Heys, MD   Encounter Date: 10/12/2019  PT End of Session - 10/12/19 1552    Visit Number  5    Number of Visits  12    Date for PT Re-Evaluation  11/04/19    Authorization Type  MCR/BCBS    PT Start Time  1530    PT Stop Time  1615    PT Time Calculation (min)  45 min    Equipment Utilized During Treatment  Other (comment);Gait belt   min guard to S prn   Activity Tolerance  Patient tolerated treatment well;Patient limited by pain   limited due to low BP - pt asymptomatic and reporting no dizziness/lightheadedness   Behavior During Therapy  [facility] for tasks assessed/performed       Past Medical History:  Diagnosis Date  . Anemia   . Arthritis   . Asthma   . Complication of anesthesia    difficulty with getting oxygen saturation up  . Diabetes mellitus   . Dyslipidemia   . Epistaxis 11/05/2012  . ESRD (end stage renal disease) on dialysis [facility])    "MWF; Jeneen Rinks" (09/15/2018)  . History of nuclear stress test    Myoview 5/18: EF 68, no infarct or ischemia, low risk  . HTN (hypertension)   . Hyperlipidemia   . Hyperparathyroidism due to renal insufficiency ([place])   . Obesity    s/p panniculectomy  . Paroxysmal A-fib (HCC)    a. chronic coumadin;  b. 12/2009 Echo: EF 60-65%, Gr 1 DD.  Marland Kitchen PCOS (polycystic ovarian syndrome)   . Pericarditis 08/2019  . Pneumonia   . Seasonal allergies   . Sleep apnea    a. not using CPAP, last study  >8 yrs  . Vitamin D deficiency     Past Surgical History:  Procedure Laterality Date  . ABDOMINAL HYSTERECTOMY     with panniculctomy  . AV FISTULA PLACEMENT  09/02/2012   Procedure: ARTERIOVENOUS (AV) FISTULA CREATION;  Surgeon:  Elam Dutch, MD;  [facility]: [facility] OR;  Service: Vascular;  Laterality: Left;  Creation of Left Radial-Cephalic Fistula   . BREAST SURGERY     Biopsy right breast  . COLONOSCOPY W/ BIOPSIES AND POLYPECTOMY    . DILATION AND CURETTAGE OF UTERUS    . KNEE ARTHROSCOPY Right   . REVISON OF ARTERIOVENOUS FISTULA Left 04/27/2014   Procedure: REVISON OF LEFT RADIAL-CEPHALIC ARTERIOVENOUS FISTULA;  Surgeon: Mal Misty, MD;  Location: [place];  Service: Vascular;  Laterality: Left;  . TOTAL HIP ARTHROPLASTY Right 09/15/2018  . TOTAL HIP ARTHROPLASTY Right 09/15/2018   Procedure: RIGHT TOTAL HIP ARTHROPLASTY ANTERIOR APPROACH;  Surgeon: Leandrew Koyanagi, MD;  Location: [place];  Service: Orthopedics;  Laterality: Right;  . UVULOPLASTY    . VIDEO ASSISTED THORACOSCOPY (VATS)/WEDGE RESECTION Right 08/31/2019   Procedure: VIDEO ASSISTED THORACOSCOPY/ DRAINAGE OF PERICARDIAL EFFUSION/ PERICARDIAL WINDOW/ ABORTED PERICARDIOCENTESIS ;  Surgeon: Lajuana Matte, MD;  Location: [place];  Service: Thoracic;  Laterality: Right;    There were no vitals filed for this visit.  Subjective Assessment - 10/12/19 1547    Subjective  Had recent hospitilzation due to chest pain, was discharged and followed up with cardiologist. She feels tired today but denies any chest pain  or dizziness.    Pertinent History  history of T2DM, PAF, ESRD- HD MWF, OA bilateral hips s/p R-THR and need of L-THR; recent hospitilzaton for pericarditis    Limitations  Sitting;Lifting;Standing;House hold activities    Currently in Pain?  No/denies                       [facility] Adult PT Treatment/Exercise - 10/12/19 0001      Ambulation/Gait   Ambulation/Gait  Yes    Ambulation/Gait Assistance  5: Supervision    Ambulation Distance (Feet)  115 Feet   X3 with seated rest breaks in between   Assistive device  Rolling walker    Gait Pattern  Decreased step length - right;Decreased step length - left;Decreased hip/knee flexion -  right;Decreased hip/knee flexion - left;Decreased dorsiflexion - right;Decreased dorsiflexion - left;Antalgic      Exercises   Other Exercises   Nu step 6 min UE/LE L5 for endurance. Sidestepping with red band around hips in bars up/down X 3 ea, step ups 6 inch step X 10 bilat. Tandem walk  in bars up/down X 3 bilat, sit to stand 2X5 with UE support                  PT Long Term Goals - 10/12/19 1601      PT LONG TERM GOAL #1   Title  Pt will be I and compliant with HEP. (Target for all goals 6 weeks 11/04/19)    Status  On-going      PT LONG TERM GOAL #2   Title  Pt will improve 6 minute walk test to >700 ft to show improved endurance for community ambulation    Baseline  523    Status  On-going      PT LONG TERM GOAL #3   Title  Pt will improve gross leg strength to 5/5 MMT bilat tested in sitting.    Baseline  4+/5    Status  On-going      PT LONG TERM GOAL #4   Title  Pt will improve TUG to and 5TSTS test <14 seconds to show improved balance, gait, enduance.    Status  On-going            Plan - 10/12/19 1558    Clinical Impression Statement  backed off some of her exercises today due to feeling short of breath and fatigued. SPO2 dropped to 88% with activity but then would quickly rebound >95%. She continues to have deficits in overall strength and endurance and balance and will continue to benefit from skilled PT    Personal Factors and Comorbidities  Comorbidity 1;Comorbidity 2;Comorbidity 3+    Comorbidities  history of T2DM, PAF, ESRD- HD MWF, OA bilateral hips s/p R-THR and need of L-THR; who was admitted  to hospital on 08/25/2019    Examination-Activity Limitations  Bend;Carry;Lift;Locomotion Level;Squat;Stairs;Stand    Examination-Participation Restrictions  Church;Meal Prep;School;Community Activity;Driving;Laundry    Stability/Clinical Decision Making  Evolving/Moderate complexity    Rehab Potential  Good    PT Frequency  2x / week    PT Duration  6  weeks    PT Treatment/Interventions  ADLs/Self Care Home Management;Cryotherapy;Electrical Stimulation;Moist Heat;DME Instruction;Gait Scientist, forensic;Therapeutic activities;Therapeutic exercise;Balance training;Neuromuscular re-education;Manual techniques;Patient/family education;Passive range of motion    PT Next Visit Plan  progress endurance and overall strength as able.    PT Home Exercise Plan  Access Code: [email or web address]    Consulted and Agree with Plan of  Care  Patient       Patient will benefit from skilled therapeutic intervention in order to improve the following deficits and impairments:  Abnormal gait, Cardiopulmonary status limiting activity, Decreased activity tolerance, Decreased balance, Decreased endurance, Decreased strength, Difficulty walking  Visit Diagnosis: Other abnormalities of gait and mobility  Muscle weakness (generalized)  Debility     Problem List Patient Active Problem List   Diagnosis Date Noted  . ESRD on dialysis ([place])   . Physical debility 09/09/2019  . Pericardial effusion   . SOB (shortness of breath)   . Leukocytosis   . Anemia of chronic disease   . PAF (paroxysmal atrial fibrillation) ([place])   . Postoperative pain   . Cardiac tamponade   . Chest pain at rest 08/26/2019  . Tachycardia 08/26/2019  . Pericarditis 08/26/2019  . Angina pectoris, unspecified ([place]) 08/25/2019  . CAD (coronary artery disease) 11/05/2018  . A-fib ([place]) 11/05/2018  . Atrial fibrillation with RVR ([place])   . Hypotension 11/04/2018  . ESRD on hemodialysis ([place]) 11/04/2018  . DVT (deep venous thrombosis) ([place]) 11/03/2018  . Primary osteoarthritis of right hip 09/15/2018  . Status post total replacement of right hip 09/15/2018  . Varicose veins of bilateral lower extremities with other complications 0000000  . Anticoagulation goal of INR 2 to 3 09/30/2014  . Pain in lower limb 05/02/2014  . Onychomycosis due to dermatophyte 04/06/2013  . Pain in joint,  ankle and foot 04/06/2013  . Bradycardia 11/06/2012  . Left-sided epistaxis 11/05/2012  . DM (diabetes mellitus) ([place]) 11/05/2012  . OSA (obstructive sleep apnea) 11/05/2012  . Acute posterior epistaxis 11/05/2012  . CKD (chronic kidney disease) 11/05/2012  . End stage renal disease ([place]) 06/25/2012  . Type 2 diabetes mellitus with complication, without long-term current use of insulin ([place]) 12/20/2009  . OBESITY 12/20/2009  . OBESITY-MORBID (>100') 12/20/2009  . Essential hypertension 12/20/2009  . Paroxysmal atrial fibrillation ([place]) 12/20/2009  . Chest pain 12/20/2009  . ABNORMAL CV (STRESS) TEST 12/20/2009    Silvestre Mesi 10/12/2019, 4:03 PM  [facility] [address] [place], [place], [address] Phone: [phone]   Fax:  [phone]  Name: Brenda Arnold MRN: [id number] Date of Birth: Apr 04, 1956

## 2019-10-13 DIAGNOSIS — N2581 Secondary hyperparathyroidism of renal origin: Secondary | ICD-10-CM | POA: Diagnosis not present

## 2019-10-13 DIAGNOSIS — Z992 Dependence on renal dialysis: Secondary | ICD-10-CM | POA: Diagnosis not present

## 2019-10-13 DIAGNOSIS — N186 End stage renal disease: Secondary | ICD-10-CM | POA: Diagnosis not present

## 2019-10-15 DIAGNOSIS — N2581 Secondary hyperparathyroidism of renal origin: Secondary | ICD-10-CM | POA: Diagnosis not present

## 2019-10-15 DIAGNOSIS — Z992 Dependence on renal dialysis: Secondary | ICD-10-CM | POA: Diagnosis not present

## 2019-10-15 DIAGNOSIS — N186 End stage renal disease: Secondary | ICD-10-CM | POA: Diagnosis not present

## 2019-10-18 DIAGNOSIS — Z992 Dependence on renal dialysis: Secondary | ICD-10-CM | POA: Diagnosis not present

## 2019-10-18 DIAGNOSIS — N2581 Secondary hyperparathyroidism of renal origin: Secondary | ICD-10-CM | POA: Diagnosis not present

## 2019-10-18 DIAGNOSIS — N186 End stage renal disease: Secondary | ICD-10-CM | POA: Diagnosis not present

## 2019-10-18 DIAGNOSIS — I309 Acute pericarditis, unspecified: Secondary | ICD-10-CM | POA: Diagnosis not present

## 2019-10-18 DIAGNOSIS — E162 Hypoglycemia, unspecified: Secondary | ICD-10-CM | POA: Diagnosis not present

## 2019-10-19 ENCOUNTER — Other Ambulatory Visit: Payer: Self-pay

## 2019-10-19 ENCOUNTER — Encounter: Payer: Self-pay | Admitting: Physical Therapy

## 2019-10-19 ENCOUNTER — Ambulatory Visit: Payer: BC Managed Care – PPO | Admitting: Physical Therapy

## 2019-10-19 VITALS — BP 106/54 | HR 85

## 2019-10-19 DIAGNOSIS — M6281 Muscle weakness (generalized): Secondary | ICD-10-CM

## 2019-10-19 DIAGNOSIS — R2689 Other abnormalities of gait and mobility: Secondary | ICD-10-CM

## 2019-10-19 DIAGNOSIS — R5381 Other malaise: Secondary | ICD-10-CM | POA: Diagnosis not present

## 2019-10-19 NOTE — Therapy (Signed)
[facility] [address] [place], [place], [address] Phone: [phone]   Fax:  [phone]  Physical Therapy Treatment  Patient Details  Name: Brenda Arnold MRN: [id number] Date of Birth: 1956/10/30 Referring Provider (PT): Courtney Heys, MD   Encounter Date: 10/19/2019  PT End of Session - 10/19/19 1629    Visit Number  6    Number of Visits  12    Date for PT Re-Evaluation  11/04/19    Authorization Type  MCR/BCBS    PT Start Time  1534    PT Stop Time  1616    PT Time Calculation (min)  42 min    Equipment Utilized During Treatment  Other (comment);Gait belt    Activity Tolerance  Patient tolerated treatment well;Patient limited by pain    Behavior During Therapy  [facility] for tasks assessed/performed       Past Medical History:  Diagnosis Date  . Anemia   . Arthritis   . Asthma   . Complication of anesthesia    difficulty with getting oxygen saturation up  . Diabetes mellitus   . Dyslipidemia   . Epistaxis 11/05/2012  . ESRD (end stage renal disease) on dialysis [facility])    "MWF; Jeneen Rinks" (09/15/2018)  . History of nuclear stress test    Myoview 5/18: EF 68, no infarct or ischemia, low risk  . HTN (hypertension)   . Hyperlipidemia   . Hyperparathyroidism due to renal insufficiency ([place])   . Obesity    s/p panniculectomy  . Paroxysmal A-fib (HCC)    a. chronic coumadin;  b. 12/2009 Echo: EF 60-65%, Gr 1 DD.  Marland Kitchen PCOS (polycystic ovarian syndrome)   . Pericarditis 08/2019  . Pneumonia   . Seasonal allergies   . Sleep apnea    a. not using CPAP, last study  >8 yrs  . Vitamin D deficiency     Past Surgical History:  Procedure Laterality Date  . ABDOMINAL HYSTERECTOMY     with panniculctomy  . AV FISTULA PLACEMENT  09/02/2012   Procedure: ARTERIOVENOUS (AV) FISTULA CREATION;  Surgeon: Elam Dutch, MD;  [facility] OR;  Service: Vascular;  Laterality: Left;  Creation of Left Radial-Cephalic  Fistula   . BREAST SURGERY     Biopsy right breast  . COLONOSCOPY W/ BIOPSIES AND POLYPECTOMY    . DILATION AND CURETTAGE OF UTERUS    . KNEE ARTHROSCOPY Right   . REVISON OF ARTERIOVENOUS FISTULA Left 04/27/2014   Procedure: REVISON OF LEFT RADIAL-CEPHALIC ARTERIOVENOUS FISTULA;  Surgeon: Mal Misty, MD;  Location: [place];  Service: Vascular;  Laterality: Left;  . TOTAL HIP ARTHROPLASTY Right 09/15/2018  . TOTAL HIP ARTHROPLASTY Right 09/15/2018   Procedure: RIGHT TOTAL HIP ARTHROPLASTY ANTERIOR APPROACH;  Surgeon: Leandrew Koyanagi, MD;  Location: [facility];  Service: Orthopedics;  Laterality: Right;  . UVULOPLASTY    . VIDEO ASSISTED THORACOSCOPY (VATS)/WEDGE RESECTION Right 08/31/2019   Procedure: VIDEO ASSISTED THORACOSCOPY/ DRAINAGE OF PERICARDIAL EFFUSION/ PERICARDIAL WINDOW/ ABORTED PERICARDIOCENTESIS ;  Surgeon: Lajuana Matte, MD;  Location: [facility] OR;  Service: Thoracic;  Laterality: Right;    Vitals:   10/19/19 1546 10/19/19 1615  BP: (!) 93/57 (!) 106/54  Pulse: 79 85    Subjective Assessment - 10/19/19 1538    Subjective  Reports that she has had a recent cold. No fever. Went to cardiologist - kept medications the same and will be monitoring any chest pain. Not having any chest pain now.  Has not had any falls. Went to [employer] today and walked with her cane and states that she feels pretty fatigued after that.    Pertinent History  history of T2DM, PAF, ESRD- HD MWF, OA bilateral hips s/p R-THR and need of L-THR; recent hospitilzaton for pericarditis    Limitations  Sitting;Lifting;Standing;House hold activities    How long can you stand comfortably?  4-5 min    How long can you walk comfortably?  household distances    Patient Stated Goals  get my endurance back    Currently in Pain?  Yes    Pain Score  3     Pain Location  Hip    Pain Orientation  Left    Aggravating Factors   feels it after doing a little of walking, feels discomfort.                        [facility] Adult PT Treatment/Exercise - 10/19/19 1634      Transfers   Transfers  Sit to Stand;Stand to Sit    Comments  5 reps throughout today's session - all without UE assist from low blue mat table      Ambulation/Gait   Ambulation/Gait  Yes    Ambulation/Gait Assistance  5: Supervision    Ambulation/Gait Assistance Details  ambulated with cane - pt needing cues for upright posture throughout gait. Practiced dynamic gait tasks such as head turns right/left to simulate looking in a parking lot (as pt does at [facility] when walking in to grab a cart)    Ambulation Distance (Feet)  115 Feet   x2, plus additional clinic distances with RW   Assistive device  Rolling walker    Gait Pattern  Decreased step length - right;Decreased step length - left;Decreased hip/knee flexion - right;Decreased hip/knee flexion - left;Decreased dorsiflexion - right;Decreased dorsiflexion - left;Antalgic;Step-through pattern    Ambulation Surface  Level;Indoor      Exercises   Other Exercises   In // bars: Backwards walking down and back 3 reps, marching down and back 2 reps with intermittent UE support. On blue foam beam on floor: side stepping with fingertip support down and back 3 reps. Standing at stair case with single and BUE support 1 x 10 reps forward step ups with RLE, attempted 1 x 5 reps step ups with LLE, however pt unable to perform due to increased pain.                  PT Long Term Goals - 10/12/19 1601      PT LONG TERM GOAL #1   Title  Pt will be I and compliant with HEP. (Target for all goals 6 weeks 11/04/19)    Status  On-going      PT LONG TERM GOAL #2   Title  Pt will improve 6 minute walk test to >700 ft to show improved endurance for community ambulation    Baseline  523    Status  On-going      PT LONG TERM GOAL #3   Title  Pt will improve gross leg strength to 5/5 MMT bilat tested in sitting.    Baseline  4+/5    Status  On-going       PT LONG TERM GOAL #4   Title  Pt will improve TUG to and 5TSTS test <14 seconds to show improved balance, gait, enduance.    Status  On-going  Plan - 10/19/19 1630    Clinical Impression Statement  Focus of today's skilled session was gait training with SPC (as this is what pt uses in the community when grocery shopping), LE strengthening, balance and activity tolerance. Pt  needed frequent standing and seated rest breaks throughout session due to fatigue. Pts O2 sats remained 95% and above throughout session today. Pt unable to perform step ups with LLE today due to increased pain and discomfort. Will continue to progress towards LTGs.    Personal Factors and Comorbidities  Comorbidity 1;Comorbidity 2;Comorbidity 3+    Comorbidities  history of T2DM, PAF, ESRD- HD MWF, OA bilateral hips s/p R-THR and need of L-THR; who was admitted  to hospital on 08/25/2019    Examination-Activity Limitations  Bend;Carry;Lift;Locomotion Level;Squat;Stairs;Stand    Examination-Participation Restrictions  Church;Meal Prep;School;Community Activity;Driving;Laundry    Stability/Clinical Decision Making  Evolving/Moderate complexity    Rehab Potential  Good    PT Frequency  2x / week    PT Duration  6 weeks    PT Treatment/Interventions  ADLs/Self Care Home Management;Cryotherapy;Electrical Stimulation;Moist Heat;DME Instruction;Gait Scientist, forensic;Therapeutic activities;Therapeutic exercise;Balance training;Neuromuscular re-education;Manual techniques;Patient/family education;Passive range of motion    PT Next Visit Plan  progress endurance and overall strength as able. standing static balance. gait/endurance. review HEP - add more as neccessary    PT Home Exercise Plan  Access Code: [email or web address]    Consulted and Agree with Plan of Care  Patient       Patient will benefit from skilled therapeutic intervention in order to improve the following deficits and impairments:  Abnormal gait,  Cardiopulmonary status limiting activity, Decreased activity tolerance, Decreased balance, Decreased endurance, Decreased strength, Difficulty walking  Visit Diagnosis: Muscle weakness (generalized)  Other abnormalities of gait and mobility     Problem List Patient Active Problem List   Diagnosis Date Noted  . ESRD on dialysis ([place])   . Physical debility 09/09/2019  . Pericardial effusion   . SOB (shortness of breath)   . Leukocytosis   . Anemia of chronic disease   . PAF (paroxysmal atrial fibrillation) ([place])   . Postoperative pain   . Cardiac tamponade   . Chest pain at rest 08/26/2019  . Tachycardia 08/26/2019  . Pericarditis 08/26/2019  . Angina pectoris, unspecified ([place]) 08/25/2019  . CAD (coronary artery disease) 11/05/2018  . A-fib ([place]) 11/05/2018  . Atrial fibrillation with RVR ([place])   . Hypotension 11/04/2018  . ESRD on hemodialysis ([place]) 11/04/2018  . DVT (deep venous thrombosis) ([place]) 11/03/2018  . Primary osteoarthritis of right hip 09/15/2018  . Status post total replacement of right hip 09/15/2018  . Varicose veins of bilateral lower extremities with other complications 0000000  . Anticoagulation goal of INR 2 to 3 09/30/2014  . Pain in lower limb 05/02/2014  . Onychomycosis due to dermatophyte 04/06/2013  . Pain in joint, ankle and foot 04/06/2013  . Bradycardia 11/06/2012  . Left-sided epistaxis 11/05/2012  . DM (diabetes mellitus) ([place]) 11/05/2012  . OSA (obstructive sleep apnea) 11/05/2012  . Acute posterior epistaxis 11/05/2012  . CKD (chronic kidney disease) 11/05/2012  . End stage renal disease ([place]) 06/25/2012  . Type 2 diabetes mellitus with complication, without long-term current use of insulin ([place]) 12/20/2009  . OBESITY 12/20/2009  . OBESITY-MORBID (>100') 12/20/2009  . Essential hypertension 12/20/2009  . Paroxysmal atrial fibrillation ([place]) 12/20/2009  . Chest pain 12/20/2009  . ABNORMAL CV (STRESS) TEST 12/20/2009    Arliss Journey, PT, DPT  10/19/2019, 4:43 PM  [facility] [address] [place], [place], [address] Phone: [phone]   Fax:  [phone]  Name: Brenda Arnold MRN: [id number] Date of Birth: [birth year]

## 2019-10-20 ENCOUNTER — Encounter: Payer: BC Managed Care – PPO | Admitting: Physical Medicine and Rehabilitation

## 2019-10-20 DIAGNOSIS — N186 End stage renal disease: Secondary | ICD-10-CM | POA: Diagnosis not present

## 2019-10-20 DIAGNOSIS — N2581 Secondary hyperparathyroidism of renal origin: Secondary | ICD-10-CM | POA: Diagnosis not present

## 2019-10-20 DIAGNOSIS — E162 Hypoglycemia, unspecified: Secondary | ICD-10-CM | POA: Diagnosis not present

## 2019-10-20 DIAGNOSIS — Z992 Dependence on renal dialysis: Secondary | ICD-10-CM | POA: Diagnosis not present

## 2019-10-21 ENCOUNTER — Encounter: Payer: Self-pay | Admitting: Physical Therapy

## 2019-10-21 ENCOUNTER — Ambulatory Visit: Payer: BC Managed Care – PPO | Admitting: Physical Therapy

## 2019-10-21 ENCOUNTER — Other Ambulatory Visit: Payer: Self-pay

## 2019-10-21 DIAGNOSIS — R5381 Other malaise: Secondary | ICD-10-CM | POA: Diagnosis not present

## 2019-10-21 DIAGNOSIS — M6281 Muscle weakness (generalized): Secondary | ICD-10-CM

## 2019-10-21 DIAGNOSIS — R2689 Other abnormalities of gait and mobility: Secondary | ICD-10-CM

## 2019-10-21 NOTE — Patient Instructions (Addendum)
Seated Alternating Leg Raise (Marching)    Sit on *edge of chair*. Raise bent knee and return. Repeat with other leg. Do _1-2__ sets of _10__ repetitions.  Copyright  VHI. All rights reserved.  KNEE: Extension, Long Arc Quads - Sitting    Raise leg until knee is straight.  Hold 3 seconds, then slowly lower down. __10_ reps per set, _1-2__ sets per day.  Copyright  VHI. All rights reserved.  Hamstring Curl: Resisted (Sitting)    Facing anchor with tubing on right ankle, leg straight out, bend knee, then slowly relax. Repeat __10__ times per set. Do __1-2__ sets per session.   http://orth.exer.us/669   Copyright  VHI. All rights reserved.   Adduction: Hip - Knees Together (Sitting)    Sit with towel roll between knees. Push knees together. Hold for _3__ seconds. Gently relax and don't let the pillow fall.   Repeat __10_ times, 1-2 sets.   Copyright  VHI. All rights reserved.

## 2019-10-22 DIAGNOSIS — N186 End stage renal disease: Secondary | ICD-10-CM | POA: Diagnosis not present

## 2019-10-22 DIAGNOSIS — E162 Hypoglycemia, unspecified: Secondary | ICD-10-CM | POA: Diagnosis not present

## 2019-10-22 DIAGNOSIS — Z992 Dependence on renal dialysis: Secondary | ICD-10-CM | POA: Diagnosis not present

## 2019-10-22 DIAGNOSIS — N2581 Secondary hyperparathyroidism of renal origin: Secondary | ICD-10-CM | POA: Diagnosis not present

## 2019-10-22 NOTE — Therapy (Signed)
[facility] [address] [place], [place], [address] Phone: [phone]   Fax:  [phone]  Physical Therapy Treatment  Patient Details  Name: Brenda Arnold MRN: [id number] Date of Birth: Nov 11, 1956 Referring Provider (PT): Courtney Heys, MD   Encounter Date: 10/21/2019  PT End of Session - 10/22/19 1441    Visit Number  7    Number of Visits  12    Date for PT Re-Evaluation  11/04/19    Authorization Type  MCR/BCBS    PT Start Time  1404    PT Stop Time  1446    PT Time Calculation (min)  42 min    Equipment Utilized During Treatment  Other (comment);Gait belt    Activity Tolerance  Patient tolerated treatment well;Patient limited by pain    Behavior During Therapy  [facility] for tasks assessed/performed       Past Medical History:  Diagnosis Date  . Anemia   . Arthritis   . Asthma   . Complication of anesthesia    difficulty with getting oxygen saturation up  . Diabetes mellitus   . Dyslipidemia   . Epistaxis 11/05/2012  . ESRD (end stage renal disease) on dialysis [facility])    "MWF; Jeneen Rinks" (09/15/2018)  . History of nuclear stress test    Myoview 5/18: EF 68, no infarct or ischemia, low risk  . HTN (hypertension)   . Hyperlipidemia   . Hyperparathyroidism due to renal insufficiency ([place])   . Obesity    s/p panniculectomy  . Paroxysmal A-fib (HCC)    a. chronic coumadin;  b. 12/2009 Echo: EF 60-65%, Gr 1 DD.  Marland Kitchen PCOS (polycystic ovarian syndrome)   . Pericarditis 08/2019  . Pneumonia   . Seasonal allergies   . Sleep apnea    a. not using CPAP, last study  >8 yrs  . Vitamin D deficiency     Past Surgical History:  Procedure Laterality Date  . ABDOMINAL HYSTERECTOMY     with panniculctomy  . AV FISTULA PLACEMENT  09/02/2012   Procedure: ARTERIOVENOUS (AV) FISTULA CREATION;  Surgeon: Elam Dutch, MD;  Location: [facility] OR;  Service: Vascular;  Laterality: Left;  Creation of Left Radial-Cephalic  Fistula   . BREAST SURGERY     Biopsy right breast  . COLONOSCOPY W/ BIOPSIES AND POLYPECTOMY    . DILATION AND CURETTAGE OF UTERUS    . KNEE ARTHROSCOPY Right   . REVISON OF ARTERIOVENOUS FISTULA Left 04/27/2014   Procedure: REVISON OF LEFT RADIAL-CEPHALIC ARTERIOVENOUS FISTULA;  Surgeon: Mal Misty, MD;  Location: [facility];  Service: Vascular;  Laterality: Left;  . TOTAL HIP ARTHROPLASTY Right 09/15/2018  . TOTAL HIP ARTHROPLASTY Right 09/15/2018   Procedure: RIGHT TOTAL HIP ARTHROPLASTY ANTERIOR APPROACH;  Surgeon: Leandrew Koyanagi, MD;  Location: [facility];  Service: Orthopedics;  Laterality: Right;  . UVULOPLASTY    . VIDEO ASSISTED THORACOSCOPY (VATS)/WEDGE RESECTION Right 08/31/2019   Procedure: VIDEO ASSISTED THORACOSCOPY/ DRAINAGE OF PERICARDIAL EFFUSION/ PERICARDIAL WINDOW/ ABORTED PERICARDIOCENTESIS ;  Surgeon: Lajuana Matte, MD;  Location: [facility];  Service: Thoracic;  Laterality: Right;    There were no vitals filed for this visit.  Subjective Assessment - 10/21/19 1408    Subjective  Just want to keep working on my stamina.  Want to be able to go back to work (at home, working at computer all day)    Pertinent History  history of T2DM, PAF, ESRD- HD MWF, OA bilateral hips s/p  R-THR and need of L-THR; recent hospitilzaton for pericarditis    Limitations  Sitting;Lifting;Standing;House hold activities    How long can you stand comfortably?  4-5 min    How long can you walk comfortably?  household distances    Patient Stated Goals  get my endurance back    Currently in Pain?  Yes    Pain Score  5     Pain Location  Hip    Pain Orientation  Left    Pain Descriptors / Indicators  Aching    Pain Type  Chronic pain    Pain Onset  More than a month ago    Pain Frequency  Intermittent    Aggravating Factors   positioning in bed    Pain Relieving Factors  change of positions in bed                       [facility] Adult PT Treatment/Exercise - 10/22/19 1435       Ambulation/Gait   Ambulation/Gait  Yes    Ambulation/Gait Assistance  5: Supervision    Ambulation/Gait Assistance Details  50 ft x 2 in gym, mat<>Nustep with RW-cues for upright posture    Ambulation Distance (Feet)  618 Feet   in 3 minute walk test   Assistive device  Rolling walker    Gait Comments  115/57; O2 98% after 3 minute walk test; additional 230 ft with RW at end of session; cues for upright posture to avoid leaning on walker      Exercises   Exercises  Knee/Hip;Ankle      Knee/Hip Exercises: Aerobic   Nustep  Level 5, 4 extremities x 6 minutes, for improved lower extremity flexibility and strengthening.        Knee/Hip Exercises: Seated   Long Arc Quad  Strengthening;Right;Left;2 sets;5 reps    Long [employer] Limitations  Cues for 3 sec hold    Heel Slides  Strengthening;Right;Left;1 set;10 reps   resisted with red theraband   Ball Squeeze  pillow squeeze x 10 reps    Other Seated Knee/Hip Exercises  Seated side step out and in, x 10 reps    Other Seated Knee/Hip Exercises  Seated heel digs, to incorporate isometric dorsiflexion as well as glut/hamstring sets x 10 reps     Marching  Strengthening;AROM;Right;Left;2 sets;5 reps             PT Education - 10/22/19 1440    Education Details  Updates to HEP, as pt states she is ready to get back to work (at home, working at [employer] all day), so exercises can be done during seated breaks during her day to focus on stregnthening    Person(s) Educated  Patient    Methods  Explanation;Demonstration;Handout    Comprehension  Verbalized understanding;Returned demonstration          PT Long Term Goals - 10/12/19 1601      PT LONG TERM GOAL #1   Title  Pt will be I and compliant with HEP. (Target for all goals 6 weeks 11/04/19)    Status  On-going      PT LONG TERM GOAL #2   Title  Pt will improve 6 minute walk test to >700 ft to show improved endurance for community ambulation    Baseline  523    Status  On-going       PT LONG TERM GOAL #3   Title  Pt will improve gross leg strength  to 5/5 MMT bilat tested in sitting.    Baseline  4+/5    Status  On-going      PT LONG TERM GOAL #4   Title  Pt will improve TUG to and 5TSTS test <14 seconds to show improved balance, gait, enduance.    Status  On-going            Plan - 10/22/19 1442    Clinical Impression Statement  Assessed 6 MWT today to check on pt's progress, with pt improving 6MWT to 618 ft  (from 523 ft) using RW.  Pt is progressing towards STG for 6MWT.  Also address seated lower extremity strength and flexibility with use of aerobic mcahine and with updates to HEP to include seated exercises    Personal Factors and Comorbidities  Comorbidity 1;Comorbidity 2;Comorbidity 3+    Comorbidities  history of T2DM, PAF, ESRD- HD MWF, OA bilateral hips s/p R-THR and need of L-THR; who was admitted  to hospital on 08/25/2019    Examination-Activity Limitations  Bend;Carry;Lift;Locomotion Level;Squat;Stairs;Stand    Examination-Participation Restrictions  Church;Meal Prep;School;Community Activity;Driving;Laundry    Stability/Clinical Decision Making  Evolving/Moderate complexity    Rehab Potential  Good    PT Frequency  2x / week    PT Duration  6 weeks    PT Treatment/Interventions  ADLs/Self Care Home Management;Cryotherapy;Electrical Stimulation;Moist Heat;DME Instruction;Gait Scientist, forensic;Therapeutic activities;Therapeutic exercise;Balance training;Neuromuscular re-education;Manual techniques;Patient/family education;Passive range of motion    PT Next Visit Plan  Review HEP, continue to work strength, balance, gait    PT Home Exercise Plan  Access Code: [email or web address]    Consulted and Agree with Plan of Care  Patient       Patient will benefit from skilled therapeutic intervention in order to improve the following deficits and impairments:  Abnormal gait, Cardiopulmonary status limiting activity, Decreased activity tolerance, Decreased  balance, Decreased endurance, Decreased strength, Difficulty walking  Visit Diagnosis: Muscle weakness (generalized)  Other abnormalities of gait and mobility     Problem List Patient Active Problem List   Diagnosis Date Noted  . ESRD on dialysis ([place])   . Physical debility 09/09/2019  . Pericardial effusion   . SOB (shortness of breath)   . Leukocytosis   . Anemia of chronic disease   . PAF (paroxysmal atrial fibrillation) ([place])   . Postoperative pain   . Cardiac tamponade   . Chest pain at rest 08/26/2019  . Tachycardia 08/26/2019  . Pericarditis 08/26/2019  . Angina pectoris, unspecified ([place]) 08/25/2019  . CAD (coronary artery disease) 11/05/2018  . A-fib ([place]) 11/05/2018  . Atrial fibrillation with RVR ([place])   . Hypotension 11/04/2018  . ESRD on hemodialysis ([place] ) 11/04/2018  . DVT (deep venous thrombosis) ([place]) 11/03/2018  . Primary osteoarthritis of right hip 09/15/2018  . Status post total replacement of right hip 09/15/2018  . Varicose veins of bilateral lower extremities with other complications 0000000  . Anticoagulation goal of INR 2 to 3 09/30/2014  . Pain in lower limb 05/02/2014  . Onychomycosis due to dermatophyte 04/06/2013  . Pain in joint, ankle and foot 04/06/2013  . Bradycardia 11/06/2012  . Left-sided epistaxis 11/05/2012  . DM (diabetes mellitus) ([place]) 11/05/2012  . OSA (obstructive sleep apnea) 11/05/2012  . Acute posterior epistaxis 11/05/2012  . CKD (chronic kidney disease) 11/05/2012  . End stage renal disease ([place]) 06/25/2012  . Type 2 diabetes mellitus with complication, without long-term current use of insulin ([place]) 12/20/2009  . OBESITY 12/20/2009  . OBESITY-MORBID (>100') 12/20/2009  .  Essential hypertension 12/20/2009  . Paroxysmal atrial fibrillation ([place]) 12/20/2009  . Chest pain 12/20/2009  . ABNORMAL CV (STRESS) TEST 12/20/2009    [place]ah Dulany W. 10/22/2019, 2:47 PM  Frazier Butt., PT   [facility] [address] [place], [place], [address] Phone: [phone]   Fax:  [phone]  Name: Brenda Arnold MRN: [id number] Date of Birth: October 21, 1956

## 2019-10-24 DIAGNOSIS — N186 End stage renal disease: Secondary | ICD-10-CM | POA: Diagnosis not present

## 2019-10-24 DIAGNOSIS — N2581 Secondary hyperparathyroidism of renal origin: Secondary | ICD-10-CM | POA: Diagnosis not present

## 2019-10-24 DIAGNOSIS — Z992 Dependence on renal dialysis: Secondary | ICD-10-CM | POA: Diagnosis not present

## 2019-10-25 ENCOUNTER — Encounter: Payer: BC Managed Care – PPO | Attending: Registered Nurse | Admitting: Physical Medicine and Rehabilitation

## 2019-10-25 ENCOUNTER — Other Ambulatory Visit: Payer: Self-pay

## 2019-10-25 ENCOUNTER — Encounter: Payer: Self-pay | Admitting: Physical Medicine and Rehabilitation

## 2019-10-25 VITALS — BP 89/56 | HR 85 | Temp 96.3°F | Ht 66.0 in | Wt 211.0 lb

## 2019-10-25 DIAGNOSIS — I4891 Unspecified atrial fibrillation: Secondary | ICD-10-CM | POA: Diagnosis not present

## 2019-10-25 DIAGNOSIS — R0602 Shortness of breath: Secondary | ICD-10-CM | POA: Diagnosis not present

## 2019-10-25 DIAGNOSIS — I314 Cardiac tamponade: Secondary | ICD-10-CM

## 2019-10-25 DIAGNOSIS — I959 Hypotension, unspecified: Secondary | ICD-10-CM | POA: Diagnosis not present

## 2019-10-25 DIAGNOSIS — Z992 Dependence on renal dialysis: Secondary | ICD-10-CM | POA: Insufficient documentation

## 2019-10-25 DIAGNOSIS — N186 End stage renal disease: Secondary | ICD-10-CM | POA: Insufficient documentation

## 2019-10-25 DIAGNOSIS — E118 Type 2 diabetes mellitus with unspecified complications: Secondary | ICD-10-CM | POA: Diagnosis not present

## 2019-10-25 DIAGNOSIS — R5381 Other malaise: Secondary | ICD-10-CM | POA: Diagnosis not present

## 2019-10-25 NOTE — Progress Notes (Signed)
Subjective:    Patient ID: Brenda Arnold, female    DOB: Apr 22, 1956, 63 y.o.   MRN: [id number]  HPI   Patient is a 63 yr old female with ESRD on HD, s/p cardiac tamponade and pericardial window and here for f/u from REHAB/CIR.  Needs  a1 liner letter written on letterhead saying can return to work.  Is ready, per pt, to go back to work. Still needs therapy- PT to work on endurance.  Starts work at noon on ESRD-HD days and works til 5pm- as long as can get 40 hours in between Monday and Saturday.   Going to PT 45-60 minutes doing 2 days/week Tuesdays and Thursdays- doing some HEP at home- 2 days/week doing SOME HEP.   SOB is getting better- can do 5 minute walk without getting winded now. Started within the last couple of weeks has been surpassing 4 minutes, before having to rest.   No palpitations or A Fib episodes that she's aware of.   Cleared by Cardiology to go back to work. Has been on RW for the last 18-24 months- due to hip pain, not endurance specifically due to cardiac tamponade.  Pain Inventory Average Pain 2 Pain Right Now 0 My pain is aching  In the last 24 hours, has pain interfered with the following? General activity 0 Relation with others 0 Enjoyment of life 0 What TIME of day is your pain at its worst? night Sleep (in general) Fair  Pain is worse with: walking Pain improves with: medication Relief from Meds: 6  Mobility walk with assistance use a walker how many minutes can you walk? 5 ability to climb steps?  yes do you drive?  yes transfers alone Do you have any goals in this area?  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  no  Physicians involved in your care Any changes since last visit?  no   Family History  Problem Relation Age of Onset  . Lung cancer Father        died @ 33  . Hypertension Mother   . Diabetes  Brother   . Kidney disease Brother    Social History   Socioeconomic History  . Marital status: Single    Spouse name: Not on file  . Number of children: Not on file  . Years of education: Not on file  . Highest education level: Not on file  Occupational History  . Occupation: Dance movement psychotherapist.    Employer: [employer]  Social Needs  . Financial resource strain: Not on file  . Food insecurity    Worry: Not on file    Inability: Not on file  . Transportation needs    Medical: Not on file    Non-medical: Not on file  Tobacco Use  . Smoking status: Never Smoker  . Smokeless tobacco: Never Used  Substance and Sexual Activity  . Alcohol use: No    Alcohol/week: 0.0 standard drinks  . Drug use: No  . Sexual activity: Not on file  Lifestyle  . Physical activity    Days per week: Not on file    Minutes per session: Not on file  . Stress: Not on file  Relationships  . Social Herbalist on phone: Not on file    Gets together: Not on file    Attends religious service: Not on file  Active member of club or organization: Not on file    Attends meetings of clubs or organizations: Not on file    Relationship status: Not on file  Other Topics Concern  . Not on file  Social History Narrative   Lives in [place] alone. 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   Past Surgical History:  Procedure Laterality Date  . ABDOMINAL HYSTERECTOMY     with panniculctomy  . AV FISTULA PLACEMENT  09/02/2012   Procedure: ARTERIOVENOUS (AV) FISTULA CREATION;  Surgeon: Elam Dutch, MD;  Location: [facility] OR;  Service: Vascular;  Laterality: Left;  Creation of Left Radial-Cephalic Fistula   . BREAST SURGERY     Biopsy right breast  . COLONOSCOPY W/ BIOPSIES AND POLYPECTOMY    . DILATION AND CURETTAGE OF UTERUS    . KNEE ARTHROSCOPY Right   . REVISON OF ARTERIOVENOUS FISTULA Left 04/27/2014   Procedure: REVISON OF LEFT RADIAL-CEPHALIC ARTERIOVENOUS  FISTULA;  Surgeon: Mal Misty, MD;  Location: [facility];  Service: Vascular;  Laterality: Left;  . TOTAL HIP ARTHROPLASTY Right 09/15/2018  . TOTAL HIP ARTHROPLASTY Right 09/15/2018   Procedure: RIGHT TOTAL HIP ARTHROPLASTY ANTERIOR APPROACH;  Surgeon: Leandrew Koyanagi, MD;  Location: [facility];  Service: Orthopedics;  Laterality: Right;  . UVULOPLASTY    . VIDEO ASSISTED THORACOSCOPY (VATS)/WEDGE RESECTION Right 08/31/2019   Procedure: VIDEO ASSISTED THORACOSCOPY/ DRAINAGE OF PERICARDIAL EFFUSION/ PERICARDIAL WINDOW/ ABORTED PERICARDIOCENTESIS ;  Surgeon: Lajuana Matte, MD;  Location: [facility];  Service: Thoracic;  Laterality: Right;   Past Medical History:  Diagnosis Date  . Anemia   . Arthritis   . Asthma   . Complication of anesthesia    difficulty with getting oxygen saturation up  . Diabetes mellitus   . Dyslipidemia   . Epistaxis 11/05/2012  . ESRD (end stage renal disease) on dialysis [facility])    "MWF; Jeneen Rinks" (09/15/2018)  . History of nuclear stress test    Myoview 5/18: EF 68, no infarct or ischemia, low risk  . HTN (hypertension)   . Hyperlipidemia   . Hyperparathyroidism due to renal insufficiency ([place])   . Obesity    s/p panniculectomy  . Paroxysmal A-fib ([facility])    a. chronic coumadin;  b. 12/2009 Echo: EF 60-65%, Gr 1 DD.  Marland Kitchen PCOS (polycystic ovarian syndrome)   . Pericarditis 08/2019  . Pneumonia   . Seasonal allergies   . Sleep apnea    a. not using CPAP, last study  >8 yrs  . Vitamin D deficiency    BP (!) 89/56   Pulse 85   Temp (!) 96.3 F (35.7 C)   Ht 5\' 6"  (1.676 m)   Wt 211 lb (95.7 kg)   BMI 34.06 kg/m   Opioid Risk Score:   Fall Risk Score:  `1  Depression screen PHQ 2/9  Depression screen PHQ 2/9 09/30/2019  Decreased Interest 0  Down, Depressed, Hopeless 0  PHQ - 2 Score 0  Altered sleeping 2  Tired, decreased energy 0  Change in appetite 1  Feeling bad or failure about yourself  0  Trouble concentrating 0  Moving slowly or  fidgety/restless 0  Suicidal thoughts 0  PHQ-9 Score 3  Difficult doing work/chores Not difficult at all    Review of Systems  Constitutional: Negative.   HENT: Negative.   Eyes: Negative.   Respiratory: Negative.   Cardiovascular: Negative.   Gastrointestinal: Negative.   Endocrine: Negative.   Genitourinary: Negative.  Musculoskeletal: Positive for arthralgias and gait problem.  Skin: Negative.   Allergic/Immunologic: Negative.   Hematological: Negative.   Psychiatric/Behavioral: Negative.   All other systems reviewed and are negative.      Objective:   Physical Exam  Awake, alert, appropriate, sitting on bedside chair, with RW, NAD RRR- rate in 80s CTA B/L- no W/R/R Soft, NT, ND, (+)BS Fistula thrill felt on L forearm       Assessment & Plan:  Patient is a 63 yr old female with ESRD on HD, s/p cardiac tamponade and pericardial window and here for f/u from REHAB/CIR.  1. Will write letter to get pt back to work- effective Monday 11/01/2019- per her "regular" schedule.    2. BP runs low is 89/56 which is normal for her and on midodrine already.   3. F/U as needed, if functionally doesn't improve as expected.

## 2019-10-25 NOTE — Patient Instructions (Signed)
Patient is a 63 yr old female with ESRD on HD, s/p cardiac tamponade and pericardial window and here for f/u from REHAB/CIR.  1. Will write letter to get pt back to work- effective Monday 11/01/2019- per her "regular" schedule.    2. BP runs low is 89/56 which is normal for her and on midodrine already.   3. F/U as needed, if functionally doesn't improve as expected.

## 2019-10-26 ENCOUNTER — Ambulatory Visit: Payer: BC Managed Care – PPO | Admitting: Physical Therapy

## 2019-10-26 DIAGNOSIS — Z992 Dependence on renal dialysis: Secondary | ICD-10-CM | POA: Diagnosis not present

## 2019-10-26 DIAGNOSIS — N2581 Secondary hyperparathyroidism of renal origin: Secondary | ICD-10-CM | POA: Diagnosis not present

## 2019-10-26 DIAGNOSIS — N186 End stage renal disease: Secondary | ICD-10-CM | POA: Diagnosis not present

## 2019-10-27 DIAGNOSIS — H6121 Impacted cerumen, right ear: Secondary | ICD-10-CM | POA: Diagnosis not present

## 2019-10-29 DIAGNOSIS — N2581 Secondary hyperparathyroidism of renal origin: Secondary | ICD-10-CM | POA: Diagnosis not present

## 2019-10-29 DIAGNOSIS — Z992 Dependence on renal dialysis: Secondary | ICD-10-CM | POA: Diagnosis not present

## 2019-10-29 DIAGNOSIS — N186 End stage renal disease: Secondary | ICD-10-CM | POA: Diagnosis not present

## 2019-11-01 DIAGNOSIS — N186 End stage renal disease: Secondary | ICD-10-CM | POA: Diagnosis not present

## 2019-11-01 DIAGNOSIS — Z992 Dependence on renal dialysis: Secondary | ICD-10-CM | POA: Diagnosis not present

## 2019-11-01 DIAGNOSIS — E1122 Type 2 diabetes mellitus with diabetic chronic kidney disease: Secondary | ICD-10-CM | POA: Diagnosis not present

## 2019-11-01 DIAGNOSIS — N2581 Secondary hyperparathyroidism of renal origin: Secondary | ICD-10-CM | POA: Diagnosis not present

## 2019-11-01 DIAGNOSIS — R52 Pain, unspecified: Secondary | ICD-10-CM | POA: Diagnosis not present

## 2019-11-02 ENCOUNTER — Other Ambulatory Visit: Payer: Self-pay

## 2019-11-02 ENCOUNTER — Ambulatory Visit: Payer: Medicare Other | Attending: Physical Medicine and Rehabilitation | Admitting: Physical Therapy

## 2019-11-02 VITALS — BP 93/49 | HR 74

## 2019-11-02 DIAGNOSIS — R5381 Other malaise: Secondary | ICD-10-CM | POA: Diagnosis not present

## 2019-11-02 DIAGNOSIS — R2689 Other abnormalities of gait and mobility: Secondary | ICD-10-CM | POA: Diagnosis not present

## 2019-11-02 DIAGNOSIS — M6281 Muscle weakness (generalized): Secondary | ICD-10-CM | POA: Diagnosis not present

## 2019-11-02 NOTE — Therapy (Signed)
[facility] [address] [place], [place], [address] Phone: [phone]   Fax:  [phone]  Physical Therapy Treatment/Re-Cert  Patient Details  Name: Brenda Arnold MRN: [id number] Date of Birth: 1956/03/17 Referring Provider (PT): Courtney Heys, MD   Encounter Date: 11/02/2019  PT End of Session - 11/03/19 0856    Visit Number  8    Number of Visits  14    Date for PT Re-Evaluation  12/03/19    Authorization Type  MCR/BCBS    PT Start Time  1532    PT Stop Time  1615    PT Time Calculation (min)  43 min    Equipment Utilized During Treatment  Gait belt    Activity Tolerance  Patient tolerated treatment well;Patient limited by pain    Behavior During Therapy  [facility] for tasks assessed/performed       Past Medical History:  Diagnosis Date  . Anemia   . Arthritis   . Asthma   . Complication of anesthesia    difficulty with getting oxygen saturation up  . Diabetes mellitus   . Dyslipidemia   . Epistaxis 11/05/2012  . ESRD (end stage renal disease) on dialysis [facility])    "MWF; Jeneen Rinks" (09/15/2018)  . History of nuclear stress test    Myoview 5/18: EF 68, no infarct or ischemia, low risk  . HTN (hypertension)   . Hyperlipidemia   . Hyperparathyroidism due to renal insufficiency ([place])   . Obesity    s/p panniculectomy  . Paroxysmal A-fib (HCC)    a. chronic coumadin;  b. 12/2009 Echo: EF 60-65%, Gr 1 DD.  Marland Kitchen PCOS (polycystic ovarian syndrome)   . Pericarditis 08/2019  . Pneumonia   . Seasonal allergies   . Sleep apnea    a. not using CPAP, last study  >8 yrs  . Vitamin D deficiency     Past Surgical History:  Procedure Laterality Date  . ABDOMINAL HYSTERECTOMY     with panniculctomy  . AV FISTULA PLACEMENT  09/02/2012   Procedure: ARTERIOVENOUS (AV) FISTULA CREATION;  Surgeon: Elam Dutch, MD;  Location: [facility] OR;  Service: Vascular;  Laterality: Left;  Creation of Left Radial-Cephalic Fistula    . BREAST SURGERY     Biopsy right breast  . COLONOSCOPY W/ BIOPSIES AND POLYPECTOMY    . DILATION AND CURETTAGE OF UTERUS    . KNEE ARTHROSCOPY Right   . REVISON OF ARTERIOVENOUS FISTULA Left 04/27/2014   Procedure: REVISON OF LEFT RADIAL-CEPHALIC ARTERIOVENOUS FISTULA;  Surgeon: Mal Misty, MD;  Location: [facility];  Service: Vascular;  Laterality: Left;  . TOTAL HIP ARTHROPLASTY Right 09/15/2018  . TOTAL HIP ARTHROPLASTY Right 09/15/2018   Procedure: RIGHT TOTAL HIP ARTHROPLASTY ANTERIOR APPROACH;  Surgeon: Leandrew Koyanagi, MD;  Location: [facility];  Service: Orthopedics;  Laterality: Right;  . UVULOPLASTY    . VIDEO ASSISTED THORACOSCOPY (VATS)/WEDGE RESECTION Right 08/31/2019   Procedure: VIDEO ASSISTED THORACOSCOPY/ DRAINAGE OF PERICARDIAL EFFUSION/ PERICARDIAL WINDOW/ ABORTED PERICARDIOCENTESIS ;  Surgeon: Lajuana Matte, MD;  Location: [facility] OR;  Service: Thoracic;  Laterality: Right;    Vitals:   11/02/19 1607  BP: (!) 93/49  Pulse: 74    Subjective Assessment - 11/02/19 1534    Subjective  Has chest pain that occurs on her right side, lasts a couple of days and then goes away. This has been going on since October 15th. Was given Tramadol - never picked up the Tramadol. Instead, pt  took Tylenol arthritis and the chest pain went away with that. When she takes a deep breath, she can feel a bit of a twinge in her R chest.    Pertinent History  history of T2DM, PAF, ESRD- HD MWF, OA bilateral hips s/p R-THR and need of L-THR; recent hospitilzaton for pericarditis    Limitations  Sitting;Lifting;Standing;House hold activities    How long can you stand comfortably?  4-5 min    How long can you walk comfortably?  household distances    Patient Stated Goals  get my endurance back    Currently in Pain?  No/denies    Pain Onset  More than a month ago         [facility] PT Assessment - 11/02/19 1549      Transfers   Comments  30 second chair stand: 9 with no UE support from mat table        Standardized Balance Assessment   Five times sit to stand comments   21.12 seconds from low mat table with no UE support      Timed Up and Go Test   Normal TUG (seconds)  17   with RW, 17.78 seconds with [facility]                11/02/19 1549  Transfers  Comments 30 second chair stand: 9 with no UE support from mat table   Ambulation/Gait  Ambulation/Gait Yes  Ambulation/Gait Assistance 5: Supervision  Ambulation/Gait Assistance Details approx. 100 ft throughout session with RW and intermittently with SPC after outcome assessments. Cues for posture with RW  Assistive device Rolling walker  Self-Care  Self-Care Other Self-Care Comments  Other Self-Care Comments  Pt continues to report intermittent R chest pain that appears every couple of days and lasts for about 3 days. Saw physician about it a month ago and was prescribed Tramadol. Therapist educating pt to follow up with MD/physician regarding continued reports of chest pain. Vitals throughout session WNL for pt and O2 sats at approx. 96-98% during session.  Exercises  Other Exercises  2 x 10 reps heel raises with BUE support on chair, 2 x 10 reps mini squat with cues for technique and glute activation in standing.  2 x 5 reps sit <> stand.  Seated marching 2 x 5 reps B. Verbally reviewed pt's HEP as pt states that she has only been performing 1-2x a week. Had discussion and educated pt on importance of performing with the goal of 4-5x daily a week in order to continue to build strength and endurance. With pt's current work schedule of sitting the remainder of the day, therapist educated pt to take frequent standing/walking breaks throughout the day as well as perform some of her seated HEP exercises. Pt verbalized understanding. Verbally reviewed pt's HEP with pt of seated and standing exercises.               PT Long Term Goals - 11/02/19 1554      PT LONG TERM GOAL #1   Title  Pt will be I and compliant with HEP.  (Target for all goals 6 weeks 11/04/19)    Baseline  pt only performing HEP 1-2 times a week, has not yet done all exercises.    Status  Partially Met      PT LONG TERM GOAL #2   Title  Pt will improve 6 minute walk test to >700 ft to show improved endurance for community ambulation    Baseline  618 ft on 10/21/19    Status  Not Met      PT LONG TERM GOAL #3   Title  Pt will improve gross leg strength to 5/5 MMT bilat tested in sitting.    Baseline  R and L hamstrings 4/5, R hip flexion 5/5, L hip flexion 4/5 (limited due to pain)    Status  Not Met      PT LONG TERM GOAL #4   Title  Pt will improve TUG to and 5TSTS test <14 seconds to show improved balance, gait, enduance.    Baseline  21.12 seconds from low mat table with no UE support on 11/02/19, TUG: 17 seconds with RW, 17.78 seconds with SPC    Status  Not Met        Revised LTGs for re-cert: PT Long Term Goals - 11/03/19 0904      PT LONG TERM GOAL #1   Title  Pt will be I and compliant with HEP. ALL LTGS DUE 11/24/19    Baseline  pt only performing HEP 1-2 times a week, has not yet done all exercises.    Time  3    Period  Weeks    Status  On-going    Target Date  11/24/19      PT LONG TERM GOAL #2   Title  Pt will improve 6 minute walk test to >700 ft to show improved endurance for community ambulation    Baseline  618 ft on 10/21/19    Time  3    Period  Weeks    Status  On-going      PT LONG TERM GOAL #3   Title  Patient will improve 30 second chair stand to at least 10 sit <> stands with no UE support in order to demonstrate improved endurance.    Baseline  9 sit <> stands on 11/02/19    Time  3    Period  Weeks    Status  Not Met      PT LONG TERM GOAL #4   Title  Pt will improve TUG to 16 seconds or less with RW and SPC in order to demonstrate decreased fall risk.    Time  3    Period  Weeks    Status  Revised           Plan - 11/03/19 0903    Clinical Impression Statement  Focus of today's  skilled session was assessing pt's LTGs for re-cert. Pt's vital signs WFL for today's session (pt's baseline BP runs low - MD aware). Pt has not met any of her LTGs in regards to 6MWT, LE strengthening, performing HEP, and TUG/5x sit to stand time. Pt performed TUG today in 17 seconds with RW (similar to time at eval), indicating a decreased risk for falls. Pt continues to have L hip pain with gait and LE strengthening activities due to pt being unable to receive a total hip replacement at this time due to other health concerns/co-morbidities. Pt able to perform 9 sit <> stands in 30 seconds, for pt's age of 21 a below average score is less than 12. Educated pt on importance of performing HEP more frequently to improve strength and endurance. Will re-cert for 2x week for 3 more weeks. Revised LTGs as appropriate.    Personal Factors and Comorbidities  Comorbidity 1;Comorbidity 2;Comorbidity 3+    Comorbidities  history of T2DM, PAF, ESRD- HD MWF, OA bilateral hips s/p R-THR and need  of L-THR; who was admitted  to hospital on 08/25/2019    Examination-Activity Limitations  Bend;Carry;Lift;Locomotion Level;Squat;Stairs;Stand    Examination-Participation Restrictions  Church;Meal Prep;School;Community Activity;Driving;Laundry    Stability/Clinical Decision Making  Evolving/Moderate complexity    Rehab Potential  Good    PT Frequency  2x / week    PT Duration  6 weeks    PT Treatment/Interventions  ADLs/Self Care Home Management;Cryotherapy;Electrical Stimulation;Moist Heat;DME Instruction;Gait Scientist, forensic;Therapeutic activities;Therapeutic exercise;Balance training;Neuromuscular re-education;Manual techniques;Patient/family education;Passive range of motion    PT Next Visit Plan  Review HEP as needed, continue to work strength, balance, gait, endurance.    PT Home Exercise Plan  Access Code: [email or web address]    Consulted and Agree with Plan of Care  Patient       Patient will benefit from skilled  therapeutic intervention in order to improve the following deficits and impairments:  Abnormal gait, Cardiopulmonary status limiting activity, Decreased activity tolerance, Decreased balance, Decreased endurance, Decreased strength, Difficulty walking  Visit Diagnosis: Muscle weakness (generalized)  Other abnormalities of gait and mobility  Debility     Problem List Patient Active Problem List   Diagnosis Date Noted  . ESRD on dialysis ([place])   . Physical debility 09/09/2019  . Pericardial effusion   . SOB (shortness of breath)   . Leukocytosis   . Anemia of chronic disease   . PAF (paroxysmal atrial fibrillation) ([place])   . Postoperative pain   . Cardiac tamponade   . Chest pain at rest 08/26/2019  . Tachycardia 08/26/2019  . Pericarditis 08/26/2019  . Angina pectoris, unspecified ([place]) 08/25/2019  . CAD (coronary artery disease) 11/05/2018  . A-fib ([place]) 11/05/2018  . Atrial fibrillation with RVR ([place])   . Hypotension 11/04/2018  . ESRD on hemodialysis ([place]) 11/04/2018  . DVT (deep venous thrombosis) ([place]) 11/03/2018  . Primary osteoarthritis of right hip 09/15/2018  . Status post total replacement of right hip 09/15/2018  . Varicose veins of bilateral lower extremities with other complications 75/88/3254  . Anticoagulation goal of INR 2 to 3 09/30/2014  . Pain in lower limb 05/02/2014  . Onychomycosis due to dermatophyte 04/06/2013  . Pain in joint, ankle and foot 04/06/2013  . Bradycardia 11/06/2012  . Left-sided epistaxis 11/05/2012  . DM (diabetes mellitus) ([place]) 11/05/2012  . OSA (obstructive sleep apnea) 11/05/2012  . Acute posterior epistaxis 11/05/2012  . CKD (chronic kidney disease) 11/05/2012  . End stage renal disease ([place]) 06/25/2012  . Type 2 diabetes mellitus with complication, without long-term current use of insulin ([place]) 12/20/2009  . OBESITY 12/20/2009  . OBESITY-MORBID (>100') 12/20/2009  . Essential hypertension 12/20/2009  . Paroxysmal atrial  fibrillation ([place]) 12/20/2009  . Chest pain 12/20/2009  . ABNORMAL CV (STRESS) TEST 12/20/2009    Arliss Journey, PT, DPT  11/03/2019, 9:04 AM  [facility] [address] [place], [place], [address] Phone: [phone]   Fax:  [phone]  Name: AMAI CAPPIELLO MRN: [id number] Date of Birth: [birth year]

## 2019-11-03 DIAGNOSIS — Z992 Dependence on renal dialysis: Secondary | ICD-10-CM | POA: Diagnosis not present

## 2019-11-03 DIAGNOSIS — N2581 Secondary hyperparathyroidism of renal origin: Secondary | ICD-10-CM | POA: Diagnosis not present

## 2019-11-03 DIAGNOSIS — N186 End stage renal disease: Secondary | ICD-10-CM | POA: Diagnosis not present

## 2019-11-05 DIAGNOSIS — N186 End stage renal disease: Secondary | ICD-10-CM | POA: Diagnosis not present

## 2019-11-05 DIAGNOSIS — Z992 Dependence on renal dialysis: Secondary | ICD-10-CM | POA: Diagnosis not present

## 2019-11-05 DIAGNOSIS — N2581 Secondary hyperparathyroidism of renal origin: Secondary | ICD-10-CM | POA: Diagnosis not present

## 2019-11-09 ENCOUNTER — Encounter: Payer: Self-pay | Admitting: Physical Therapy

## 2019-11-09 ENCOUNTER — Other Ambulatory Visit: Payer: Self-pay

## 2019-11-09 ENCOUNTER — Ambulatory Visit: Payer: Medicare Other | Admitting: Physical Therapy

## 2019-11-09 DIAGNOSIS — R2689 Other abnormalities of gait and mobility: Secondary | ICD-10-CM

## 2019-11-09 DIAGNOSIS — M6281 Muscle weakness (generalized): Secondary | ICD-10-CM | POA: Diagnosis not present

## 2019-11-09 DIAGNOSIS — R5381 Other malaise: Secondary | ICD-10-CM | POA: Diagnosis not present

## 2019-11-09 NOTE — Therapy (Signed)
[facility] [address] [place], [place], [address] Phone: [phone]   Fax:  [phone]  Physical Therapy Treatment  Patient Details  Name: Brenda Arnold MRN: [id number] Date of Birth: 15-Aug-1956 Referring Provider (PT): Courtney Heys, MD   Encounter Date: 11/09/2019  PT End of Session - 11/09/19 1605    Visit Number  9    Number of Visits  14    Date for PT Re-Evaluation  12/03/19    Authorization Type  MCR/BCBS    PT Start Time  1519    PT Stop Time  1604    PT Time Calculation (min)  45 min    Equipment Utilized During Treatment  Gait belt    Activity Tolerance  Patient tolerated treatment well;Patient limited by pain    Behavior During Therapy  [facility] for tasks assessed/performed       Past Medical History:  Diagnosis Date  . Anemia   . Arthritis   . Asthma   . Complication of anesthesia    difficulty with getting oxygen saturation up  . Diabetes mellitus   . Dyslipidemia   . Epistaxis 11/05/2012  . ESRD (end stage renal disease) on dialysis [facility])    "MWF; Jeneen Rinks" (09/15/2018)  . History of nuclear stress test    Myoview 5/18: EF 68, no infarct or ischemia, low risk  . HTN (hypertension)   . Hyperlipidemia   . Hyperparathyroidism due to renal insufficiency ([place])   . Obesity    s/p panniculectomy  . Paroxysmal A-fib (HCC)    a. chronic coumadin;  b. 12/2009 Echo: EF 60-65%, Gr 1 DD.  Marland Kitchen PCOS (polycystic ovarian syndrome)   . Pericarditis 08/2019  . Pneumonia   . Seasonal allergies   . Sleep apnea    a. not using CPAP, last study  >8 yrs  . Vitamin D deficiency     Past Surgical History:  Procedure Laterality Date  . ABDOMINAL HYSTERECTOMY     with panniculctomy  . AV FISTULA PLACEMENT  09/02/2012   Procedure: ARTERIOVENOUS (AV) FISTULA CREATION;  Surgeon: Elam Dutch, MD;  Location: [facility] OR;  Service: Vascular;  Laterality: Left;  Creation of Left Radial-Cephalic Fistula   .  BREAST SURGERY     Biopsy right breast  . COLONOSCOPY W/ BIOPSIES AND POLYPECTOMY    . DILATION AND CURETTAGE OF UTERUS    . KNEE ARTHROSCOPY Right   . REVISON OF ARTERIOVENOUS FISTULA Left 04/27/2014   Procedure: REVISON OF LEFT RADIAL-CEPHALIC ARTERIOVENOUS FISTULA;  Surgeon: Mal Misty, MD;  Location: [facility];  Service: Vascular;  Laterality: Left;  . TOTAL HIP ARTHROPLASTY Right 09/15/2018  . TOTAL HIP ARTHROPLASTY Right 09/15/2018   Procedure: RIGHT TOTAL HIP ARTHROPLASTY ANTERIOR APPROACH;  Surgeon: Leandrew Koyanagi, MD;  Location: [facility];  Service: Orthopedics;  Laterality: Right;  . UVULOPLASTY    . VIDEO ASSISTED THORACOSCOPY (VATS)/WEDGE RESECTION Right 08/31/2019   Procedure: VIDEO ASSISTED THORACOSCOPY/ DRAINAGE OF PERICARDIAL EFFUSION/ PERICARDIAL WINDOW/ ABORTED PERICARDIOCENTESIS ;  Surgeon: Lajuana Matte, MD;  Location: [facility];  Service: Thoracic;  Laterality: Right;    There were no vitals filed for this visit.  Subjective Assessment - 11/09/19 1521    Subjective  Today was her first day back at work. Has not had any chest pain in about a week. Is still going to go call doctor like we discussed last time. Has been trying to do more activity at home.    Pertinent  History  history of T2DM, PAF, ESRD- HD MWF, OA bilateral hips s/p R-THR and need of L-THR; recent hospitilzaton for pericarditis    Limitations  Sitting;Lifting;Standing;House hold activities    How long can you stand comfortably?  4-5 min    How long can you walk comfortably?  household distances    Patient Stated Goals  get my endurance back    Currently in Pain?  No/denies    Pain Onset  More than a month ago                       [facility] Adult PT Treatment/Exercise - 11/09/19 1528      Exercises   Other Exercises   Reviewed seated HEP: Marching 1 x 10 reps B LAQs: 1 x 10 reps B, cues for isometric hold at top for 3 seconds before eccentrically lowering, 2 x 10 reps with 2lb ankle weight.1 x  10 reps hip ADD pillow squeezes. 1 x 10 reps seated hip ABD with green theraband - added to HEP. 2 x 5 reps of sit <> stands onto blue foam at edge of mat. 2 x 10 reps side stepping B at countertop with green theraband around distal thighs - cues for technique for hip ABD.  1 x 5 reps wide BOS lateral weight shift with reaching.       Knee/Hip Exercises: Aerobic   Nustep   Nu step 5 min L5 UE/LE for BLE strengthening, ROM and endurance.           Balance Exercises - 11/09/19 1552      Balance Exercises: Standing   Standing Eyes Closed  Wide (BOA);Foam/compliant surface;20 secs;30 secs;4 reps    Tandem Stance  Intermittent upper extremity support        PT Education - 11/09/19 1606    Education Details  reviewing seated exercises for HEP, added seated hip ABD with green theraband for pt to perform when sitting at work.    Person(s) Educated  Patient    Methods  Explanation;Demonstration    Comprehension  Verbalized understanding;Returned demonstration          PT Long Term Goals - 11/03/19 0904      PT LONG TERM GOAL #1   Title  Pt will be I and compliant with HEP. ALL LTGS DUE 11/24/19    Baseline  pt only performing HEP 1-2 times a week, has not yet done all exercises.    Time  3    Period  Weeks    Status  On-going    Target Date  11/24/19      PT LONG TERM GOAL #2   Title  Pt will improve 6 minute walk test to >700 ft to show improved endurance for community ambulation    Baseline  618 ft on 10/21/19    Time  3    Period  Weeks    Status  On-going      PT LONG TERM GOAL #3   Title  Patient will improve 30 second chair stand to at least 10 sit <> stands with no UE support in order to demonstrate improved endurance.    Baseline  9 sit <> stands on 11/02/19    Time  3    Period  Weeks    Status  Not Met      PT LONG TERM GOAL #4   Title  Pt will improve TUG to 16 seconds or less with RW and SPC  in order to demonstrate decreased fall risk.    Time  3    Period   Weeks    Status  Revised            Plan - 11/09/19 1610    Clinical Impression Statement  Focus of today's skilled  session was BLE strengthening, endurance, and standing balance. Pt tolerated session well today. Reviewed pt's seated HEP as pt had not been performing all seated exercises - pt able to verbalize understanding of performing them daily. Will continue to progress towards LTGs.    Personal Factors and Comorbidities  Comorbidity 1;Comorbidity 2;Comorbidity 3+    Comorbidities  history of T2DM, PAF, ESRD- HD MWF, OA bilateral hips s/p R-THR and need of L-THR; who was admitted  to hospital on 08/25/2019    Examination-Activity Limitations  Bend;Carry;Lift;Locomotion Level;Squat;Stairs;Stand    Examination-Participation Restrictions  Church;Meal Prep;School;Community Activity;Driving;Laundry    Stability/Clinical Decision Making  Evolving/Moderate complexity    Rehab Potential  Good    PT Frequency  2x / week    PT Duration  6 weeks    PT Treatment/Interventions  ADLs/Self Care Home Management;Cryotherapy;Electrical Stimulation;Moist Heat;DME Instruction;Gait Scientist, forensic;Therapeutic activities;Therapeutic exercise;Balance training;Neuromuscular re-education;Manual techniques;Patient/family education;Passive range of motion    PT Next Visit Plan  needs 10th visit progress note. Review HEP as needed, continue to work strength, balance, gait, endurance.    PT Home Exercise Plan  Access Code: [email or web address]    Consulted and Agree with Plan of Care  Patient       Patient will benefit from skilled therapeutic intervention in order to improve the following deficits and impairments:  Abnormal gait, Cardiopulmonary status limiting activity, Decreased activity tolerance, Decreased balance, Decreased endurance, Decreased strength, Difficulty walking  Visit Diagnosis: Other abnormalities of gait and mobility  Muscle weakness (generalized)     Problem List Patient Active Problem  List   Diagnosis Date Noted  . ESRD on dialysis ([place])   . Physical debility 09/09/2019  . Pericardial effusion   . SOB (shortness of breath)   . Leukocytosis   . Anemia of chronic disease   . PAF (paroxysmal atrial fibrillation) ([place])   . Postoperative pain   . Cardiac tamponade   . Chest pain at rest 08/26/2019  . Tachycardia 08/26/2019  . Pericarditis 08/26/2019  . Angina pectoris, unspecified ([place]) 08/25/2019  . CAD (coronary artery disease) 11/05/2018  . A-fib ([place]) 11/05/2018  . Atrial fibrillation with RVR ([place])   . Hypotension 11/04/2018  . ESRD on hemodialysis ([place]) 11/04/2018  . DVT (deep venous thrombosis) ([place]) 11/03/2018  . Primary osteoarthritis of right hip 09/15/2018  . Status post total replacement of right hip 09/15/2018  . Varicose veins of bilateral lower extremities with other complications 77/82/4235  . Anticoagulation goal of INR 2 to 3 09/30/2014  . Pain in lower limb 05/02/2014  . Onychomycosis due to dermatophyte 04/06/2013  . Pain in joint, ankle and foot 04/06/2013  . Bradycardia 11/06/2012  . Left-sided epistaxis 11/05/2012  . DM (diabetes mellitus) ([place]) 11/05/2012  . OSA (obstructive sleep apnea) 11/05/2012  . Acute posterior epistaxis 11/05/2012  . CKD (chronic kidney disease) 11/05/2012  . End stage renal disease ([place]) 06/25/2012  . Type 2 diabetes mellitus with complication, without long-term current use of insulin ([place]) 12/20/2009  . OBESITY 12/20/2009  . OBESITY-MORBID (>100') 12/20/2009  . Essential hypertension 12/20/2009  . Paroxysmal atrial fibrillation ([place]) 12/20/2009  . Chest pain 12/20/2009  . ABNORMAL CV (STRESS) TEST 12/20/2009    Zacariah Belue  Cherlynn Kaiser, PT, DPT  11/09/2019, 4:12 PM  [facility] [address] [place], [place], [address] Phone: [phone]   Fax:  [phone]  Name: LAURENASHLEY VIAR MRN: [id number] Date of Birth: 30-Dec-1955

## 2019-11-18 ENCOUNTER — Ambulatory Visit: Payer: Medicare Other | Admitting: Physical Therapy

## 2019-11-18 ENCOUNTER — Encounter: Payer: Self-pay | Admitting: Physical Therapy

## 2019-11-18 ENCOUNTER — Other Ambulatory Visit: Payer: Self-pay

## 2019-11-18 DIAGNOSIS — M6281 Muscle weakness (generalized): Secondary | ICD-10-CM | POA: Diagnosis not present

## 2019-11-18 DIAGNOSIS — R2689 Other abnormalities of gait and mobility: Secondary | ICD-10-CM

## 2019-11-18 DIAGNOSIS — R5381 Other malaise: Secondary | ICD-10-CM | POA: Diagnosis not present

## 2019-11-18 NOTE — Patient Instructions (Signed)
Tips for Walking:  1-Stand as tall as you can and not push too heavily through your arms when walking  2-Stay close to the walker-avoid pushing it too far out in front of you  3-Look ahead at targets for your best posture while walking  4-Try to look into a walker bag to securely place on the front of your walker (and try to lessen the weight of the bag)

## 2019-11-18 NOTE — Therapy (Signed)
[facility] [address] [place], [place], [address] Phone: [phone]   Fax:  [phone]  Physical Therapy Treatment/10th Visit Progress Note  Patient Details  Name: Brenda Arnold MRN: [id number] Date of Birth: 11-Jan-1956 Referring Provider (PT): Courtney Heys, MD   Encounter Date: 11/18/2019  PT End of Session - 11/18/19 1557    Visit Number  10    Number of Visits  14    Date for PT Re-Evaluation  12/03/19    Authorization Type  MCR/BCBS    PT Start Time  1450    PT Stop Time  1530    PT Time Calculation (min)  40 min    Equipment Utilized During Treatment  Gait belt    Activity Tolerance  Patient tolerated treatment well    Behavior During Therapy  [facility] for tasks assessed/performed       Past Medical History:  Diagnosis Date  . Anemia   . Arthritis   . Asthma   . Complication of anesthesia    difficulty with getting oxygen saturation up  . Diabetes mellitus   . Dyslipidemia   . Epistaxis 11/05/2012  . ESRD (end stage renal disease) on dialysis [facility])    "MWF; Jeneen Rinks" (09/15/2018)  . History of nuclear stress test    Myoview 5/18: EF 68, no infarct or ischemia, low risk  . HTN (hypertension)   . Hyperlipidemia   . Hyperparathyroidism due to renal insufficiency ([place])   . Obesity    s/p panniculectomy  . Paroxysmal A-fib (HCC)    a. chronic coumadin;  b. 12/2009 Echo: EF 60-65%, Gr 1 DD.  Marland Kitchen PCOS (polycystic ovarian syndrome)   . Pericarditis 08/2019  . Pneumonia   . Seasonal allergies   . Sleep apnea    a. not using CPAP, last study  >8 yrs  . Vitamin D deficiency     Past Surgical History:  Procedure Laterality Date  . ABDOMINAL HYSTERECTOMY     with panniculctomy  . AV FISTULA PLACEMENT  09/02/2012   Procedure: ARTERIOVENOUS (AV) FISTULA CREATION;  Surgeon: Elam Dutch, MD;  Location: [facility] OR;  Service: Vascular;  Laterality: Left;  Creation of Left Radial-Cephalic Fistula   .  BREAST SURGERY     Biopsy right breast  . COLONOSCOPY W/ BIOPSIES AND POLYPECTOMY    . DILATION AND CURETTAGE OF UTERUS    . KNEE ARTHROSCOPY Right   . REVISON OF ARTERIOVENOUS FISTULA Left 04/27/2014   Procedure: REVISON OF LEFT RADIAL-CEPHALIC ARTERIOVENOUS FISTULA;  Surgeon: Mal Misty, MD;  Location: [facility];  Service: Vascular;  Laterality: Left;  . TOTAL HIP ARTHROPLASTY Right 09/15/2018  . TOTAL HIP ARTHROPLASTY Right 09/15/2018   Procedure: RIGHT TOTAL HIP ARTHROPLASTY ANTERIOR APPROACH;  Surgeon: Leandrew Koyanagi, MD;  Location: [facility];  Service: Orthopedics;  Laterality: Right;  . UVULOPLASTY    . VIDEO ASSISTED THORACOSCOPY (VATS)/WEDGE RESECTION Right 08/31/2019   Procedure: VIDEO ASSISTED THORACOSCOPY/ DRAINAGE OF PERICARDIAL EFFUSION/ PERICARDIAL WINDOW/ ABORTED PERICARDIOCENTESIS ;  Surgeon: Lajuana Matte, MD;  Location: [facility];  Service: Thoracic;  Laterality: Right;    There were no vitals filed for this visit.  Subjective Assessment - 11/18/19 1452    Subjective  No changes, nothing new.  Hip is feeling a bit better today.    Pertinent History  history of T2DM, PAF, ESRD- HD MWF, OA bilateral hips s/p R-THR and need of L-THR; recent hospitilzaton for pericarditis    Limitations  Sitting;Lifting;Standing;House hold activities    How long can you stand comfortably?  4-5 min    How long can you walk comfortably?  household distances    Patient Stated Goals  get my endurance back    Currently in Pain?  No/denies    Pain Onset  More than a month ago                       [facility] Adult PT Treatment/Exercise - 11/18/19 0001      Transfers   Transfers  Sit to Stand;Stand to Sit    Sit to Stand  6: Modified independent (Device/Increase time);With upper extremity assist;From chair/3-in-1    Stand to Sit  6: Modified independent (Device/Increase time);With upper extremity assist;To chair/3-in-1    Number of Reps  Other reps (comment)   5 reps throughout  session     Ambulation/Gait   Ambulation/Gait  Yes    Ambulation/Gait Assistance  5: Supervision    Ambulation/Gait Assistance Details  Cues provided after 6MWT, for practicing upright posture, decreased UE reliance on UEs through RW, improved posture, keeping RW close, looking ahead at target. Discussed repositioning pt's bag so that it is on front of walker for even weigth distribution, but educated pt in trying to take away some of the weight in her bag.    Ambulation Distance (Feet)  763 Feet   in 6 MWT; 250; 130, 60 ft then 80 ft   Assistive device  Rolling walker    Gait Pattern  Decreased step length - right;Decreased step length - left;Decreased hip/knee flexion - right;Decreased hip/knee flexion - left;Decreased dorsiflexion - right;Decreased dorsiflexion - left;Antalgic;Step-through pattern    Ambulation Surface  Level;Indoor    Pre-Gait Activities  At counter, had pt demonstrate several of her standing exercises with lessened UE support at counter, decreased forward lean onto counter.      Knee/Hip Exercises: Aerobic   Nustep   Nu step 5 min L3, BLEs only for strengthening of lower extremities to aid in improved gait endurance             PT Education - 11/18/19 1556    Education Details  Tips for walking, for improved posture, decreased UE reliance on RW (try to do this for shorter household distances)    Person(s) Educated  Patient    Methods  Explanation;Demonstration    Comprehension  Verbalized understanding;Returned demonstration          PT Long Term Goals - 11/18/19 1605      PT LONG TERM GOAL #1   Title  Pt will be I and compliant with HEP. ALL LTGS DUE 11/24/19    Baseline  pt only performing HEP 1-2 times a week, has not yet done all exercises.    Time  3    Period  Weeks    Status  On-going      PT LONG TERM GOAL #2   Title  Pt will improve 6 minute walk test to >700 ft to show improved endurance for community ambulation    Baseline  618 ft on  10/21/19; 763 ft 11/18/2019    Time  3    Period  Weeks    Status  Achieved      PT LONG TERM GOAL #3   Title  Patient will improve 30 second chair stand to at least 10 sit <> stands with no UE support in order to demonstrate improved endurance.    Baseline  9 sit <> stands on 11/02/19    Time  3    Period  Weeks    Status  Not Met      PT LONG TERM GOAL #4   Title  Pt will improve TUG to 16 seconds or less with RW and SPC in order to demonstrate decreased fall risk.    Time  3    Period  Weeks    Status  Revised            Plan - 11/18/19 1558    Clinical Impression Statement  10th Visit Progress Note, covering dates from 09/23/2019-11/18/2019.  Subjective:  Pt reports doing more walking at home, but prefers to be walking outside (when the weather is nice).  Objective measure:  6 MWT 763 ft today (improved from 618 ft 10/21/2019 and from 523 at initial 6MWT).  Cues provided throughout session today for improved posture, decreased UE reliance through RW.   Pt is progressing towards remaining LTGs.  Pt feels she will be ready for d/c next visit, so further goals will be assessed at that time.    Personal Factors and Comorbidities  Comorbidity 1;Comorbidity 2;Comorbidity 3+    Comorbidities  history of T2DM, PAF, ESRD- HD MWF, OA bilateral hips s/p R-THR and need of L-THR; who was admitted  to hospital on 08/25/2019    Examination-Activity Limitations  Bend;Carry;Lift;Locomotion Level;Squat;Stairs;Stand    Examination-Participation Restrictions  Church;Meal Prep;School;Community Activity;Driving;Laundry    Stability/Clinical Decision Making  Evolving/Moderate complexity    Rehab Potential  Good    PT Frequency  2x / week    PT Duration  6 weeks    PT Treatment/Interventions  ADLs/Self Care Home Management;Cryotherapy;Electrical Stimulation;Moist Heat;DME Instruction;Gait Scientist, forensic;Therapeutic activities;Therapeutic exercise;Balance training;Neuromuscular  re-education;Manual techniques;Patient/family education;Passive range of motion    PT Next Visit Plan  Check LTGs and plan for d/c next visit.    PT Home Exercise Plan  Access Code: [email or web address]    Consulted and Agree with Plan of Care  Patient       Patient will benefit from skilled therapeutic intervention in order to improve the following deficits and impairments:  Abnormal gait, Cardiopulmonary status limiting activity, Decreased activity tolerance, Decreased balance, Decreased endurance, Decreased strength, Difficulty walking  Visit Diagnosis: Other abnormalities of gait and mobility  Muscle weakness (generalized)     Problem List Patient Active Problem List   Diagnosis Date Noted  . ESRD on dialysis ([place])   . Physical debility 09/09/2019  . Pericardial effusion   . SOB (shortness of breath)   . Leukocytosis   . Anemia of chronic disease   . PAF (paroxysmal atrial fibrillation) ([place])   . Postoperative pain   . Cardiac tamponade   . Chest pain at rest 08/26/2019  . Tachycardia 08/26/2019  . Pericarditis 08/26/2019  . Angina pectoris, unspecified ([place]) 08/25/2019  . CAD (coronary artery disease) 11/05/2018  . A-fib ([place]) 11/05/2018  . Atrial fibrillation with RVR ([place])   . Hypotension 11/04/2018  . ESRD on hemodialysis ([place]) 11/04/2018  . DVT (deep venous thrombosis) ([place]) 11/03/2018  . Primary osteoarthritis of right hip 09/15/2018  . Status post total replacement of right hip 09/15/2018  . Varicose veins of bilateral lower extremities with other complications 43/15/4008  . Anticoagulation goal of INR 2 to 3 09/30/2014  . Pain in lower limb 05/02/2014  . Onychomycosis due to dermatophyte 04/06/2013  . Pain in joint, ankle and foot 04/06/2013  . Bradycardia 11/06/2012  . Left-sided epistaxis 11/05/2012  .  DM (diabetes mellitus) ([place]) 11/05/2012  . OSA (obstructive sleep apnea) 11/05/2012  . Acute posterior epistaxis 11/05/2012  . CKD (chronic kidney disease)  11/05/2012  . End stage renal disease ([place]) 06/25/2012  . Type 2 diabetes mellitus with complication, without long-term current use of insulin ([place]) 12/20/2009  . OBESITY 12/20/2009  . OBESITY-MORBID (>100') 12/20/2009  . Essential hypertension 12/20/2009  . Paroxysmal atrial fibrillation ([place]) 12/20/2009  . Chest pain 12/20/2009  . ABNORMAL CV (STRESS) TEST 12/20/2009    Benney [place]merville W. 11/18/2019, 4:07 PM Frazier Butt., PT  [facility] [address] [place], [place], [address] Phone: [phone]   Fax:  [phone]  Name: Brenda Arnold MRN: [id number] Date of Birth: December 07, 1955

## 2019-11-25 ENCOUNTER — Ambulatory Visit: Payer: Medicare Other | Admitting: Physical Therapy

## 2019-11-25 ENCOUNTER — Encounter: Payer: Self-pay | Admitting: Physical Therapy

## 2019-11-25 ENCOUNTER — Other Ambulatory Visit: Payer: Self-pay

## 2019-11-25 DIAGNOSIS — R5381 Other malaise: Secondary | ICD-10-CM | POA: Diagnosis not present

## 2019-11-25 DIAGNOSIS — R2689 Other abnormalities of gait and mobility: Secondary | ICD-10-CM | POA: Diagnosis not present

## 2019-11-25 DIAGNOSIS — M6281 Muscle weakness (generalized): Secondary | ICD-10-CM | POA: Diagnosis not present

## 2019-11-25 NOTE — Patient Instructions (Signed)
Pool Options:  [employer] ([address])  [employer]

## 2019-11-25 NOTE — Therapy (Signed)
[facility] [address] [place], [place], [address] Phone: [phone]   Fax:  [phone]  Physical Therapy Treatment/Discharge Summary  Patient Details  Name: Brenda Arnold MRN: [id number] Date of Birth: 1956/11/21 Referring Provider (PT): Courtney Heys, MD   Encounter Date: 11/25/2019  PT End of Session - 11/25/19 1455    Visit Number  11    Number of Visits  14    Date for PT Re-Evaluation  12/03/19    Authorization Type  MCR/BCBS    PT Start Time  4854    PT Stop Time  1438    PT Time Calculation (min)  33 min    Equipment Utilized During Treatment  Gait belt    Activity Tolerance  Patient tolerated treatment well    Behavior During Therapy  [facility] for tasks assessed/performed       Past Medical History:  Diagnosis Date  . Anemia   . Arthritis   . Asthma   . Complication of anesthesia    difficulty with getting oxygen saturation up  . Diabetes mellitus   . Dyslipidemia   . Epistaxis 11/05/2012  . ESRD (end stage renal disease) on dialysis [facility])    "MWF; Jeneen Rinks" (09/15/2018)  . History of nuclear stress test    Myoview 5/18: EF 68, no infarct or ischemia, low risk  . HTN (hypertension)   . Hyperlipidemia   . Hyperparathyroidism due to renal insufficiency ([place])   . Obesity    s/p panniculectomy  . Paroxysmal A-fib (HCC)    a. chronic coumadin;  b. 12/2009 Echo: EF 60-65%, Gr 1 DD.  Marland Kitchen PCOS (polycystic ovarian syndrome)   . Pericarditis 08/2019  . Pneumonia   . Seasonal allergies   . Sleep apnea    a. not using CPAP, last study  >8 yrs  . Vitamin D deficiency     Past Surgical History:  Procedure Laterality Date  . ABDOMINAL HYSTERECTOMY     with panniculctomy  . AV FISTULA PLACEMENT  09/02/2012   Procedure: ARTERIOVENOUS (AV) FISTULA CREATION;  Surgeon: Elam Dutch, MD;  Location: [facility] OR;  Service: Vascular;  Laterality: Left;  Creation of Left Radial-Cephalic Fistula   . BREAST  SURGERY     Biopsy right breast  . COLONOSCOPY W/ BIOPSIES AND POLYPECTOMY    . DILATION AND CURETTAGE OF UTERUS    . KNEE ARTHROSCOPY Right   . REVISON OF ARTERIOVENOUS FISTULA Left 04/27/2014   Procedure: REVISON OF LEFT RADIAL-CEPHALIC ARTERIOVENOUS FISTULA;  Surgeon: Mal Misty, MD;  Location: [facility];  Service: Vascular;  Laterality: Left;  . TOTAL HIP ARTHROPLASTY Right 09/15/2018  . TOTAL HIP ARTHROPLASTY Right 09/15/2018   Procedure: RIGHT TOTAL HIP ARTHROPLASTY ANTERIOR APPROACH;  Surgeon: Leandrew Koyanagi, MD;  Location: [facility];  Service: Orthopedics;  Laterality: Right;  . UVULOPLASTY    . VIDEO ASSISTED THORACOSCOPY (VATS)/WEDGE RESECTION Right 08/31/2019   Procedure: VIDEO ASSISTED THORACOSCOPY/ DRAINAGE OF PERICARDIAL EFFUSION/ PERICARDIAL WINDOW/ ABORTED PERICARDIOCENTESIS ;  Surgeon: Lajuana Matte, MD;  Location: [facility];  Service: Thoracic;  Laterality: Right;    There were no vitals filed for this visit.  Subjective Assessment - 11/25/19 1406    Subjective  No changes, just my hip is bothering me with the cold weather.    Pertinent History  history of T2DM, PAF, ESRD- HD MWF, OA bilateral hips s/p R-THR and need of L-THR; recent hospitilzaton for pericarditis    Limitations  Sitting;Lifting;Standing;House hold  activities    How long can you stand comfortably?  4-5 min    How long can you walk comfortably?  household distances    Patient Stated Goals  get my endurance back    Currently in Pain?  Yes    Pain Score  3     Pain Location  Hip    Pain Orientation  Left    Pain Descriptors / Indicators  Aching    Pain Type  Chronic pain    Pain Onset  More than a month ago    Pain Frequency  Intermittent    Aggravating Factors   positioning in bed    Pain Relieving Factors  change of positions in bed                       [facility] Adult PT Treatment/Exercise - 11/25/19 0001      Transfers   Transfers  Sit to Stand;Stand to Sit    Sit to Stand  6:  Modified independent (Device/Increase time);With upper extremity assist;From chair/3-in-1    Stand to Sit  6: Modified independent (Device/Increase time);With upper extremity assist;To chair/3-in-1    Comments  30 second sit<>stand:  10 reps sit<>stand from chair, no UE support      Ambulation/Gait   Ambulation/Gait  Yes    Ambulation/Gait Assistance  5: Supervision    Ambulation/Gait Assistance Details  Trialed cane, as pt reports she does not generally use cane for short distances at home; she will ambulate without device (noted 5 ft x 2 today in session with antalgic gait pattern on LLE)    Ambulation Distance (Feet)  40 Feet   cane; 100 ft x 2 with RW   Assistive device  Rolling walker;Straight cane    Gait Pattern  Decreased step length - right;Decreased step length - left;Decreased hip/knee flexion - right;Decreased hip/knee flexion - left;Decreased dorsiflexion - right;Decreased dorsiflexion - left;Antalgic;Step-through pattern    Ambulation Surface  Level;Indoor    Gait Comments  Gait training with cane today, used in RUE, pt sequences correctly.  PT explained that use of cane will help with improved stability, decreased antalgic pattern (versus no device for short distances); continue to ambulate with RW for most gait.      Standardized Balance Assessment   Standardized Balance Assessment  Timed Up and Go Test      Timed Up and Go Test   TUG  Normal TUG    Normal TUG (seconds)  20.84   17.38 sec at best (with RW)     Self-Care   Self-Care  Other Self-Care Comments    Other Self-Care Comments   Discussed progress towards goals and plans for d/c.  Pt in agreement.  Pt is interested at some point (but not now during winter months) to do aquatic exercise; provided her with list of indoor pools [facility] [place]), though not sure which are open other than the [facility] right now due to [place].  Explained benefits of aquatic exercises given pt's hip pain and plans for upcoming hip  surgery within the year.  Let her know we have aquatic therapy services should she be interested in the future.      Exercises   Exercises  Knee/Hip;Ankle    Other Exercises   Reviewed seated HEP-with pt return demo understanding.  Also worked on seated marching out and in x 10 reps each side (pt c/o discomfort L hip) and rolling pool noodle for knee  flexion/extension x 10 reps.  Educated pt she can do this while working at her computer at home.       Pt performed (as review of HEP):  Seated marching, seated LAQ,seated ankle pumps .  Pt reports performing 2x/wk.  Discussed trying to perform 3x/wk on the days she does not have dialysis.       PT Education - 11/25/19 1454    Education Details  Progress towards goals, plans for d/c this visit; options for pool/aquatic exercise    Person(s) Educated  Patient    Methods  Explanation;Handout    Comprehension  Verbalized understanding          PT Long Term Goals - 11/25/19 1413      PT LONG TERM GOAL #1   Title  Pt will be I and compliant with HEP. ALL LTGS DUE 11/24/19    Baseline  pt only performing HEP 1-2 times a week, has not yet done all exercises.    Time  3    Period  Weeks    Status  Achieved      PT LONG TERM GOAL #2   Title  Pt will improve 6 minute walk test to >700 ft to show improved endurance for community ambulation    Baseline  618 ft on 10/21/19; 763 ft 11/18/2019    Time  3    Period  Weeks    Status  Achieved      PT LONG TERM GOAL #3   Title  Patient will improve 30 second chair stand to at least 10 sit <> stands with no UE support in order to demonstrate improved endurance.    Baseline  9 sit <> stands on 11/02/19    Time  3    Period  Weeks    Status  Achieved      PT LONG TERM GOAL #4   Title  Pt will improve TUG to 16 seconds or less with RW and SPC in order to demonstrate decreased fall risk.    Baseline  TUG 20.84 sec; 17.38 sec at best    Time  3    Period  Weeks    Status  Not Met             Plan - 11/25/19 1456    Clinical Impression Statement  Assessed LTGs this visit, with pt meeting LTG 1, LTG 2, and LTG 3.  LTG 4 not met for TUG score; pt has demonstrated improvement in 5x sit<>stand and endurance for gait with improved distance on 6MWT.  Pt appears pleased with her current HEP and is appropriate for d/c.    Personal Factors and Comorbidities  Comorbidity 1;Comorbidity 2;Comorbidity 3+    Comorbidities  history of T2DM, PAF, ESRD- HD MWF, OA bilateral hips s/p R-THR and need of L-THR; who was admitted  to hospital on 08/25/2019    Examination-Activity Limitations  Bend;Carry;Lift;Locomotion Level;Squat;Stairs;Stand    Examination-Participation Restrictions  Church;Meal Prep;School;Community Activity;Driving;Laundry    Stability/Clinical Decision Making  Evolving/Moderate complexity    Rehab Potential  Good    PT Frequency  2x / week    PT Duration  6 weeks    PT Treatment/Interventions  ADLs/Self Care Home Management;Cryotherapy;Electrical Stimulation;Moist Heat;DME Instruction;Gait Scientist, forensic;Therapeutic activities;Therapeutic exercise;Balance training;Neuromuscular re-education;Manual techniques;Patient/family education;Passive range of motion    PT Next Visit Plan  D/C this visit.    PT Home Exercise Plan  Access Code: [email or web address]    Consulted and Agree with Plan of Care  Patient       Patient will benefit from skilled therapeutic intervention in order to improve the following deficits and impairments:  Abnormal gait, Cardiopulmonary status limiting activity, Decreased activity tolerance, Decreased balance, Decreased endurance, Decreased strength, Difficulty walking  Visit Diagnosis: Other abnormalities of gait and mobility  Muscle weakness (generalized)     Problem List Patient Active Problem List   Diagnosis Date Noted  . ESRD on dialysis ([place])   . Physical debility 09/09/2019  . Pericardial effusion   . SOB (shortness of breath)   .  Leukocytosis   . Anemia of chronic disease   . PAF (paroxysmal atrial fibrillation) ([place])   . Postoperative pain   . Cardiac tamponade   . Chest pain at rest 08/26/2019  . Tachycardia 08/26/2019  . Pericarditis 08/26/2019  . Angina pectoris, unspecified ([place]) 08/25/2019  . CAD (coronary artery disease) 11/05/2018  . A-fib ([place]) 11/05/2018  . Atrial fibrillation with RVR ([place])   . Hypotension 11/04/2018  . ESRD on hemodialysis ([place]) 11/04/2018  . DVT (deep venous thrombosis) ([place]) 11/03/2018  . Primary osteoarthritis of right hip 09/15/2018  . Status post total replacement of right hip 09/15/2018  . Varicose veins of bilateral lower extremities with other complications 15/40/0867  . Anticoagulation goal of INR 2 to 3 09/30/2014  . Pain in lower limb 05/02/2014  . Onychomycosis due to dermatophyte 04/06/2013  . Pain in joint, ankle and foot 04/06/2013  . Bradycardia 11/06/2012  . Left-sided epistaxis 11/05/2012  . DM (diabetes mellitus) ([place]) 11/05/2012  . OSA (obstructive sleep apnea) 11/05/2012  . Acute posterior epistaxis 11/05/2012  . CKD (chronic kidney disease) 11/05/2012  . End stage renal disease ([place]) 06/25/2012  . Type 2 diabetes mellitus with complication, without long-term current use of insulin ([place]) 12/20/2009  . OBESITY 12/20/2009  . OBESITY-MORBID (>100') 12/20/2009  . Essential hypertension 12/20/2009  . Paroxysmal atrial fibrillation ([place]) 12/20/2009  . Chest pain 12/20/2009  . ABNORMAL CV (STRESS) TEST 12/20/2009    Ashir Kunz W. 11/25/2019, 2:59 PM  Frazier Butt., PT   [facility] [address] [place], [place], [address] Phone: [phone]   Fax:  [phone]  Name: HAILLEE JOHANN MRN: [id number] Date of Birth: May 01, 1956   PHYSICAL THERAPY DISCHARGE SUMMARY  Visits from Start of Care: 11  Current functional level related to goals / functional outcomes: PT Long Term Goals -  11/25/19 1413      PT LONG TERM GOAL #1   Title  Pt will be I and compliant with HEP. ALL LTGS DUE 11/24/19    Baseline  pt only performing HEP 1-2 times a week, has not yet done all exercises.    Time  3    Period  Weeks    Status  Achieved      PT LONG TERM GOAL #2   Title  Pt will improve 6 minute walk test to >700 ft to show improved endurance for community ambulation    Baseline  618 ft on 10/21/19; 763 ft 11/18/2019    Time  3    Period  Weeks    Status  Achieved      PT LONG TERM GOAL #3   Title  Patient will improve 30 second chair stand to at least 10 sit <> stands with no UE support in order to demonstrate improved endurance.    Baseline  9 sit <> stands on 11/02/19    Time  3    Period  Weeks    Status  Achieved      PT LONG TERM GOAL #4   Title  Pt will improve TUG to 16 seconds or less with RW and SPC in order to demonstrate decreased fall risk.    Baseline  TUG 20.84 sec; 17.38 sec at best    Time  3    Period  Weeks    Status  Not Met      Pt has met 3 of 4 LTGs   Remaining deficits: L hip pain, antalgic gait pattern, decreased strength/endurance (improved throughout course of therapy)   Education / Equipment: Educated in [employer]  Plan: Patient agrees to discharge.  Patient goals were partially met. Patient is being discharged due to being pleased with the current functional level.  ?????         Mady Haagensen, PT 11/25/19 3:02 PM Phone: [phone] Fax: [phone]

## 2019-12-01 ENCOUNTER — Telehealth: Payer: Self-pay | Admitting: Cardiology

## 2019-12-01 DIAGNOSIS — I3139 Other pericardial effusion (noninflammatory): Secondary | ICD-10-CM

## 2019-12-01 DIAGNOSIS — I313 Pericardial effusion (noninflammatory): Secondary | ICD-10-CM

## 2019-12-01 NOTE — Telephone Encounter (Signed)
Patient states that her BP has been running low the past few days. On Saturday after she had dialysis, she felt dizzy and passed out. Her BP was 87/50 at that time. She was given fluids at the dialysis clinic. Today she is at dialysis and is unable to have it done due to BP being too low: 89/53. I spoke with the dialysis nurse who states that they are only able to clean her blood right now and that her BP has slightly been increasing since cleaning: 98/63, 103/55. The nurse states that a nurse practitioner came to listen to the patient and she sounded good with no pleural rub heard however, she noted some jugular venous distension. Nurse states that the patient had pericarditis last time her BP dropped low. The patient is currently taking midodrine BID and has taken her first dose this morning. The patient states that today she is not symptomatic.  I spoke with DOD, Dr. Shary Key who recommended that the patient come in for an echocardiogram to check for pleural effusion. The patient has been scheduled for an echo at 11:30AM tomorrow 12/31.

## 2019-12-01 NOTE — Telephone Encounter (Signed)
Pt c/o BP issue: STAT if pt c/o blurred vision, one-sided weakness or slurred speech  1. What are your last 5 BP readings?  84/51 hr 73, 87/60 hr 68, 89/53 hr 64, 98/51 hr 66, 103/55 hr 70,   2. Are you having any other symptoms (ex. Dizziness, headache, blurred vision, passed out)? no  3. What is your BP issue? Blood Pressure is too low for dialysis. She states they are just cleaning the blood, because her BP is too low. She says that on Saturday 12/26 after treatment she almost passed out and that her blood sugar has dropped as well. She would like a call back about whether she can be seen sooner than her appointment 2/11 with Dr. Radford Pax.

## 2019-12-02 ENCOUNTER — Other Ambulatory Visit: Payer: Self-pay

## 2019-12-02 ENCOUNTER — Ambulatory Visit (HOSPITAL_COMMUNITY): Payer: Medicare Other | Attending: Interventional Cardiology

## 2019-12-02 DIAGNOSIS — E1122 Type 2 diabetes mellitus with diabetic chronic kidney disease: Secondary | ICD-10-CM | POA: Diagnosis not present

## 2019-12-02 DIAGNOSIS — I3139 Other pericardial effusion (noninflammatory): Secondary | ICD-10-CM

## 2019-12-02 DIAGNOSIS — Z992 Dependence on renal dialysis: Secondary | ICD-10-CM | POA: Diagnosis not present

## 2019-12-02 DIAGNOSIS — I313 Pericardial effusion (noninflammatory): Secondary | ICD-10-CM | POA: Insufficient documentation

## 2019-12-02 DIAGNOSIS — N186 End stage renal disease: Secondary | ICD-10-CM | POA: Diagnosis not present

## 2019-12-28 ENCOUNTER — Encounter: Payer: Medicare Other | Admitting: Vascular Surgery

## 2019-12-31 ENCOUNTER — Other Ambulatory Visit: Payer: Self-pay

## 2019-12-31 ENCOUNTER — Encounter: Payer: Self-pay | Admitting: Podiatry

## 2019-12-31 ENCOUNTER — Ambulatory Visit (INDEPENDENT_AMBULATORY_CARE_PROVIDER_SITE_OTHER): Payer: Medicare Other | Admitting: Podiatry

## 2019-12-31 DIAGNOSIS — N186 End stage renal disease: Secondary | ICD-10-CM

## 2019-12-31 DIAGNOSIS — M79674 Pain in right toe(s): Secondary | ICD-10-CM

## 2019-12-31 DIAGNOSIS — L84 Corns and callosities: Secondary | ICD-10-CM

## 2019-12-31 DIAGNOSIS — B351 Tinea unguium: Secondary | ICD-10-CM | POA: Diagnosis not present

## 2019-12-31 DIAGNOSIS — M79675 Pain in left toe(s): Secondary | ICD-10-CM | POA: Diagnosis not present

## 2019-12-31 DIAGNOSIS — E0822 Diabetes mellitus due to underlying condition with diabetic chronic kidney disease: Secondary | ICD-10-CM | POA: Diagnosis not present

## 2019-12-31 DIAGNOSIS — Z992 Dependence on renal dialysis: Secondary | ICD-10-CM | POA: Diagnosis not present

## 2019-12-31 NOTE — Patient Instructions (Signed)
Diabetes Mellitus and Foot Care Foot care is an important part of your health, especially when you have diabetes. Diabetes may cause you to have problems because of poor blood flow (circulation) to your feet and legs, which can cause your skin to:  Become thinner and drier.  Break more easily.  Heal more slowly.  Peel and crack. You may also have nerve damage (neuropathy) in your legs and feet, causing decreased feeling in them. This means that you may not notice minor injuries to your feet that could lead to more serious problems. Noticing and addressing any potential problems early is the best way to prevent future foot problems. How to care for your feet Foot hygiene  Wash your feet daily with warm water and mild soap. Do not use hot water. Then, pat your feet and the areas between your toes until they are completely dry. Do not soak your feet as this can dry your skin.  Trim your toenails straight across. Do not dig under them or around the cuticle. File the edges of your nails with an emery board or nail file.  Apply a moisturizing lotion or petroleum jelly to the skin on your feet and to dry, brittle toenails. Use lotion that does not contain alcohol and is unscented. Do not apply lotion between your toes. Shoes and socks  Wear clean socks or stockings every day. Make sure they are not too tight. Do not wear knee-high stockings since they may decrease blood flow to your legs.  Wear shoes that fit properly and have enough cushioning. Always look in your shoes before you put them on to be sure there are no objects inside.  To break in new shoes, wear them for just a few hours a day. This prevents injuries on your feet. Wounds, scrapes, corns, and calluses  Check your feet daily for blisters, cuts, bruises, sores, and redness. If you cannot see the bottom of your feet, use a mirror or ask someone for help.  Do not cut corns or calluses or try to remove them with medicine.  If you  find a minor scrape, cut, or break in the skin on your feet, keep it and the skin around it clean and dry. You may clean these areas with mild soap and water. Do not clean the area with peroxide, alcohol, or iodine.  If you have a wound, scrape, corn, or callus on your foot, look at it several times a day to make sure it is healing and not infected. Check for: ? Redness, swelling, or pain. ? Fluid or blood. ? Warmth. ? Pus or a bad smell. General instructions  Do not cross your legs. This may decrease blood flow to your feet.  Do not use heating pads or hot water bottles on your feet. They may burn your skin. If you have lost feeling in your feet or legs, you may not know this is happening until it is too late.  Protect your feet from hot and cold by wearing shoes, such as at the beach or on hot pavement.  Schedule a complete foot exam at least once a year (annually) or more often if you have foot problems. If you have foot problems, report any cuts, sores, or bruises to your health care provider immediately. Contact a health care provider if:  You have a medical condition that increases your risk of infection and you have any cuts, sores, or bruises on your feet.  You have an injury that is not   healing.  You have redness on your legs or feet.  You feel burning or tingling in your legs or feet.  You have pain or cramps in your legs and feet.  Your legs or feet are numb.  Your feet always feel cold.  You have pain around a toenail. Get help right away if:  You have a wound, scrape, corn, or callus on your foot and: ? You have pain, swelling, or redness that gets worse. ? You have fluid or blood coming from the wound, scrape, corn, or callus. ? Your wound, scrape, corn, or callus feels warm to the touch. ? You have pus or a bad smell coming from the wound, scrape, corn, or callus. ? You have a fever. ? You have a red line going up your leg. Summary  Check your feet every day  for cuts, sores, red spots, swelling, and blisters.  Moisturize feet and legs daily.  Wear shoes that fit properly and have enough cushioning.  If you have foot problems, report any cuts, sores, or bruises to your health care provider immediately.  Schedule a complete foot exam at least once a year (annually) or more often if you have foot problems. This information is not intended to replace advice given to you by your health care provider. Make sure you discuss any questions you have with your health care provider. Document Revised: 08/11/2019 Document Reviewed: 12/20/2016 Elsevier Patient Education  2020 Elsevier Inc.  

## 2020-01-01 NOTE — Progress Notes (Signed)
Subjective: Brenda Arnold presents today for follow up of preventative diabetic foot care with h/o ESRD on hemodialysis.   Pt states she was hospitalized in October for pericarditis.  Allergies  Allergen Reactions  . Iodine Shortness Of Breath  . Shellfish Allergy Shortness Of Breath  . Norvasc [Amlodipine] Swelling    SWELLING REACTION UNSPECIFIED   . Naltrexone Other (See Comments)     Objective: There were no vitals filed for this visit.  Vascular Examination:  Capillary refill time to digits immediate b/l, palpable DP pulses b/l, palpable PT pulses b/l, pedal hair absent b/l and skin temperature gradient within normal limits b/l  Dermatological Examination: Pedal skin with normal turgor, texture and tone bilaterally, no open wounds bilaterally, no interdigital macerations bilaterally, toenails 1-5 b/l elongated, dystrophic, thickened, crumbly with subungual debris and hyperkeratotic lesion(s) submet head 5 b/l.  No erythema, no edema, no drainage, no flocculence  Musculoskeletal: Normal muscle strength 5/5 to all lower extremity muscle groups bilaterally, no gross bony deformities bilaterally and no pain crepitus or joint limitation noted with ROM b/l  Neurological: Protective sensation intact 5/5 intact bilaterally with 10g monofilament b/l and vibratory sensation intact b/l  Assessment: 1. Pain due to onychomycosis of toenails of both feet   2. Callus   3. Diabetes mellitus due to underlying condition with chronic kidney disease on chronic dialysis, without long-term current use of insulin ([place])     Plan: -Continue diabetic foot care principles. Literature dispensed on today.  -Toenails 1-5 b/l were debrided in length and girth without iatrogenic bleeding. -corns and calluses were debrided without complication or incident. Total number debrided submet head 5 b/l -Patient to continue soft, supportive shoe gear daily. -Patient to report any pedal injuries to medical  professional immediately. -Patient/POA to call should there be question/concern in the interim.  Return in about 3 months (around 03/30/2020) for diabetic nail trim/ Eliquis.

## 2020-01-04 ENCOUNTER — Encounter: Payer: Self-pay | Admitting: Vascular Surgery

## 2020-01-04 ENCOUNTER — Other Ambulatory Visit: Payer: Self-pay

## 2020-01-04 ENCOUNTER — Ambulatory Visit (INDEPENDENT_AMBULATORY_CARE_PROVIDER_SITE_OTHER): Payer: Medicare Other | Admitting: Vascular Surgery

## 2020-01-04 VITALS — BP 83/50 | HR 67 | Temp 97.2°F | Resp 16 | Ht 66.0 in | Wt 214.0 lb

## 2020-01-04 DIAGNOSIS — Z992 Dependence on renal dialysis: Secondary | ICD-10-CM

## 2020-01-04 DIAGNOSIS — N186 End stage renal disease: Secondary | ICD-10-CM

## 2020-01-04 NOTE — Progress Notes (Signed)
Patient name: Brenda Arnold MRN: [id number] DOB: [birth year] Sex: female  REASON FOR CONSULT: Evaluate scab over left arm AV fistula  HPI: AVIGAYIL Arnold is a 64 y.o. female, with history of end-stage renal disease on hemodialysis Monday Wednesday Friday, A. fib on Eliquis, hypertension, hyperlipidemia, diabetes that presents for evaluation of a scab on her left arm AV fistula.  Patient states she has had recurrent scabs and ulcerations here over multiple occcasions since last year.  She has a left radiocephalic AV fistula that was placed with Dr. Oneida Alar in 2013.  She then had sidebranch ligation in 2015.  She is not aware of any other revisions.  She states this fistula has worked very well.  She is on Eliquis given a history of A. fib diagnosed last year around the time of hip surgery.  Past Medical History:  Diagnosis Date  . Anemia   . Arthritis   . Asthma   . Complication of anesthesia    difficulty with getting oxygen saturation up  . Diabetes mellitus   . Dyslipidemia   . Epistaxis 11/05/2012  . ESRD (end stage renal disease) on dialysis [facility])    "MWF; [facility] R[facility]" (09/15/2018)  . History of nuclear stress test    Myoview 5/18: EF 68, no infarct or ischemia, low risk  . HTN (hypertension)   . Hyperlipidemia   . Hyperparathyroidism due to renal insufficiency ([place])   . Obesity    s/p panniculectomy  . Paroxysmal A-fib ([facility])    a. chronic coumadin;  b. 12/2009 Echo: EF 60-65%, Gr 1 DD.  Marland Kitchen PCOS (polycystic ovarian syndrome)   . Pericarditis 08/2019  . Pneumonia   . Seasonal allergies   . Sleep apnea    a. not using CPAP, last study  >8 yrs  . Vitamin D deficiency     Past Surgical History:  Procedure Laterality Date  . ABDOMINAL HYSTERECTOMY     with panniculctomy  . AV FISTULA PLACEMENT  09/02/2012   Procedure: ARTERIOVENOUS (AV) FISTULA CREATION;  Surgeon: Elam Dutch, MD;  Location: [facility] OR;  Service: Vascular;  Laterality: Left;  Creation of Left  Radial-Cephalic Fistula   . BREAST SURGERY     Biopsy right breast  . COLONOSCOPY W/ BIOPSIES AND POLYPECTOMY    . DILATION AND CURETTAGE OF UTERUS    . KNEE ARTHROSCOPY Right   . REVISON OF ARTERIOVENOUS FISTULA Left 04/27/2014   Procedure: REVISON OF LEFT RADIAL-CEPHALIC ARTERIOVENOUS FISTULA;  Surgeon: Mal Misty, MD;  Location: [place];  Service: Vascular;  Laterality: Left;  . TOTAL HIP ARTHROPLASTY Right 09/15/2018  . TOTAL HIP ARTHROPLASTY Right 09/15/2018   Procedure: RIGHT TOTAL HIP ARTHROPLASTY ANTERIOR APPROACH;  Surgeon: Leandrew Koyanagi, MD;  Location: [place];  Service: Orthopedics;  Laterality: Right;  . UVULOPLASTY    . VIDEO ASSISTED THORACOSCOPY (VATS)/WEDGE RESECTION Right 08/31/2019   Procedure: VIDEO ASSISTED THORACOSCOPY/ DRAINAGE OF PERICARDIAL EFFUSION/ PERICARDIAL WINDOW/ ABORTED PERICARDIOCENTESIS ;  Surgeon: Lajuana Matte, MD;  Location: [facility] OR;  Service: Thoracic;  Laterality: Right;    Family History  Problem Relation Age of Onset  . Lung cancer Father        died @ 83  . Hypertension Mother   . Diabetes Brother   . Kidney disease Brother     SOCIAL HISTORY: Social History   Socioeconomic History  . Marital status: Single    Spouse name: Not on file  . Number of children: Not on file  .  Years of education: Not on file  . Highest education level: Not on file  Occupational History  . Occupation: Dance movement psychotherapist.    Employer: [employer]  Tobacco Use  . Smoking status: Never Smoker  . Smokeless tobacco: Never Used  Substance and Sexual Activity  . Alcohol use: No    Alcohol/week: 0.0 standard drinks  . Drug use: No  . Sexual activity: Not on file  Other Topics Concern  . Not on file  Social History Narrative   Lives in [place] alone. 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   Social Determinants of Health   Financial Resource Strain:   . Difficulty of Paying Living Expenses: Not on file  Food  Insecurity:   . Worried About Charity fundraiser in the Last Year: Not on file  . Ran Out of Food in the Last Year: Not on file  Transportation Needs:   . Lack of Transportation (Medical): Not on file  . Lack of Transportation (Non-Medical): Not on file  Physical Activity:   . Days of Exercise per Week: Not on file  . Minutes of Exercise per Session: Not on file  Stress:   . Feeling of Stress : Not on file  Social Connections:   . Frequency of Communication with Friends and Family: Not on file  . Frequency of Social Gatherings with Friends and Family: Not on file  . Attends Religious Services: Not on file  . Active Member of Clubs or Organizations: Not on file  . Attends Archivist Meetings: Not on file  . Marital Status: Not on file  Intimate Partner Violence:   . Fear of Current or Ex-Partner: Not on file  . Emotionally Abused: Not on file  . Physically Abused: Not on file  . Sexually Abused: Not on file    Allergies  Allergen Reactions  . Iodine Shortness Of Breath  . Shellfish Allergy Shortness Of Breath  . Norvasc [Amlodipine] Swelling    SWELLING REACTION UNSPECIFIED   . Naltrexone Other (See Comments)    Current Outpatient Medications  Medication Sig Dispense Refill  . amiodarone (PACERONE) 200 MG tablet TAKE 1 TABLET BY MOUTH EVERY DAY 90 tablet 1  . calcitRIOL (ROCALTROL) 0.25 MCG capsule Take 3 capsules (0.75 mcg total) by mouth every Monday, Wednesday, and Friday with hemodialysis.    Marland Kitchen colchicine 0.6 MG tablet TAKE 0.5 TABLET BY MOUTH EVERY MONDAY, WEDNESDAY, AND FRIDAY WITH HEMODIALYSIS. 45 tablet 1  . docusate sodium (COLACE) 100 MG capsule Take 1 capsule (100 mg total) by mouth 2 (two) times daily. 60 capsule 0  . ELIQUIS 5 MG TABS tablet Take 5 mg by mouth 2 (two) times daily.     . Etelcalcetide HCl (PARSABIV IV) Etelcalcetide (Parsabiv)    . ethyl chloride spray     . gabapentin (NEURONTIN) 100 MG capsule Take 100 mg by mouth at bedtime.  3  .  Menthol-Methyl Salicylate (MUSCLE RUB) 10-15 % CREA Apply 1 application topically as needed for muscle pain.  0  . midodrine (PROAMATINE) 10 MG tablet Take 1 tablet (10 mg total) by mouth 3 (three) times daily with meals.    . multivitamin (RENA-VIT) TABS tablet Take 1 tablet by mouth at bedtime.  0  . ondansetron (ZOFRAN) 4 MG tablet SMARTSIG:1 Tablet(s) By Mouth 3 Times a Week PRN    . sucroferric oxyhydroxide (VELPHORO) 500 MG chewable tablet Chew 1,000 mg by mouth 3 (three) times daily.    Marland Kitchen  predniSONE (DELTASONE) 10 MG tablet PLEASE SEE ATTACHED FOR DETAILED DIRECTIONS     No current facility-administered medications for this visit.    REVIEW OF SYSTEMS:  [X]  denotes positive finding, [ ]  denotes negative finding Cardiac  Comments:  Chest pain or chest pressure:    Shortness of breath upon exertion:    Short of breath when lying flat:    Irregular heart rhythm:        Vascular    Pain in calf, thigh, or hip brought on by ambulation:    Pain in feet at night that wakes you up from your sleep:     Blood clot in your veins:    Leg swelling:         Pulmonary    Oxygen at home:    Productive cough:     Wheezing:         Neurologic    Sudden weakness in arms or legs:     Sudden numbness in arms or legs:     Sudden onset of difficulty speaking or slurred speech:    Temporary loss of vision in one eye:     Problems with dizziness:         Gastrointestinal    Blood in stool:     Vomited blood:         Genitourinary    Burning when urinating:     Blood in urine:        Psychiatric    Major depression:         Hematologic    Bleeding problems:    Problems with blood clotting too easily:        Skin    Rashes or ulcers:        Constitutional    Fever or chills:      PHYSICAL EXAM: Vitals:   01/04/20 1523  BP: (!) 83/50  Pulse: 67  Resp: 16  Temp: (!) 97.2 F (36.2 C)  TempSrc: Temporal  SpO2: 100%  Weight: 214 lb (97.1 kg)  Height: 5\' 6"  (1.676 m)     GENERAL: The patient is a well-nourished female, in no acute distress. The vital signs are documented above. CARDIAC: There is a regular rate and rhythm.  VASCULAR:  Left radiocephalic fistula good thrill Two tandem aneurysms over left radiocephalic fistula More distal aneurysm in the forearm with overlying ulceration and scab PULMONARY: There is good air exchange bilaterally without wheezing or rales. ABDOMEN: Soft and non-tender with normal pitched bowel sounds.  MUSCULOSKELETAL: There are no major deformities or cyanosis. NEUROLOGIC: No focal weakness or paresthesias are detected. SKIN: There are no ulcers or rashes noted. PSYCHIATRIC: The patient has a normal affect.  DATA:   None  Assessment/Plan:  64 year old female with end-stage renal disease that presents with ulcerated scab over her left radiocephalic AV fistula with underlying aneurysm in the distal forearm.  I offered her left arm AV fistula revision with aneurysm plication in order to prevent any future bleeding risk.  We will schedule this on a nondialysis day and she is currently dialyzing on Monday Wednesday Friday.  We have asked that she hold her Eliquis for 48 hours prior to surgery.  She has a second aneurysm on the more proximal forearm with hypopigmentation but discussed I would leave that alone for now given no overt ulceration and this will allow dialysis to have an access point following her revision given that she does not want a catheter.  Risk and benefits discussed.  Marty Heck, MD [facility] Office: [phone]

## 2020-01-05 ENCOUNTER — Other Ambulatory Visit: Payer: Self-pay

## 2020-01-11 ENCOUNTER — Other Ambulatory Visit (HOSPITAL_COMMUNITY)
Admission: RE | Admit: 2020-01-11 | Discharge: 2020-01-11 | Disposition: A | Discharge status: Home / Self Care | Payer: Medicare Other | Source: Ambulatory Visit | Attending: Vascular Surgery | Admitting: Vascular Surgery

## 2020-01-11 ENCOUNTER — Encounter (HOSPITAL_COMMUNITY): Payer: Self-pay | Admitting: Vascular Surgery

## 2020-01-11 DIAGNOSIS — Z20822 Contact with and (suspected) exposure to covid-19: Secondary | ICD-10-CM | POA: Insufficient documentation

## 2020-01-11 DIAGNOSIS — Z01812 Encounter for preprocedural laboratory examination: Secondary | ICD-10-CM | POA: Diagnosis present

## 2020-01-11 LAB — SARS CORONAVIRUS 2 (TAT 6-24 HRS): SARS Coronavirus 2: NEGATIVE

## 2020-01-11 NOTE — Progress Notes (Signed)
Brenda Arnold denies chest pain or shortness of breath.  Patient Covid was negative today, she is in quarantine, she will go to dialysis wearing a mask tomorrow.  Brenda Arnold states she doesn't have someone to stay with her after surgery. I left a voice message for Larene Beach, scheduler at Dr Ainsley Spinner office.

## 2020-01-12 NOTE — Progress Notes (Signed)
Izora Gala, RN from Dr Ainsley Spinner office called.  Stated that the patient does not have a ride home after surgery tomorrow on 01/13/20. Izora Gala said that Dr Carlis Abbott stated "surgery has to be done".  Dr Carlis Abbott will admit patient overnight.  Lindsi, RN informed.

## 2020-01-13 ENCOUNTER — Observation Stay (HOSPITAL_COMMUNITY)
Admission: RE | Admit: 2020-01-13 | Discharge: 2020-01-14 | Disposition: A | Discharge status: Home / Self Care | Payer: Medicare Other | Attending: Vascular Surgery | Admitting: Vascular Surgery

## 2020-01-13 ENCOUNTER — Ambulatory Visit (HOSPITAL_COMMUNITY): Payer: Medicare Other | Admitting: Anesthesiology

## 2020-01-13 ENCOUNTER — Ambulatory Visit: Payer: BC Managed Care – PPO | Admitting: Cardiology

## 2020-01-13 ENCOUNTER — Encounter (HOSPITAL_COMMUNITY)
Admission: RE | Disposition: A | Discharge status: Home / Self Care | Payer: Self-pay | Source: Home / Self Care | Attending: Vascular Surgery

## 2020-01-13 ENCOUNTER — Encounter (HOSPITAL_COMMUNITY): Payer: Self-pay | Admitting: Vascular Surgery

## 2020-01-13 ENCOUNTER — Other Ambulatory Visit: Payer: Self-pay

## 2020-01-13 DIAGNOSIS — Z6834 Body mass index (BMI) 34.0-34.9, adult: Secondary | ICD-10-CM | POA: Insufficient documentation

## 2020-01-13 DIAGNOSIS — Z992 Dependence on renal dialysis: Secondary | ICD-10-CM | POA: Insufficient documentation

## 2020-01-13 DIAGNOSIS — Z7901 Long term (current) use of anticoagulants: Secondary | ICD-10-CM | POA: Insufficient documentation

## 2020-01-13 DIAGNOSIS — M1990 Unspecified osteoarthritis, unspecified site: Secondary | ICD-10-CM | POA: Insufficient documentation

## 2020-01-13 DIAGNOSIS — J45909 Unspecified asthma, uncomplicated: Secondary | ICD-10-CM | POA: Diagnosis not present

## 2020-01-13 DIAGNOSIS — G4730 Sleep apnea, unspecified: Secondary | ICD-10-CM | POA: Insufficient documentation

## 2020-01-13 DIAGNOSIS — Z96641 Presence of right artificial hip joint: Secondary | ICD-10-CM | POA: Insufficient documentation

## 2020-01-13 DIAGNOSIS — E282 Polycystic ovarian syndrome: Secondary | ICD-10-CM | POA: Diagnosis not present

## 2020-01-13 DIAGNOSIS — Z95828 Presence of other vascular implants and grafts: Secondary | ICD-10-CM | POA: Insufficient documentation

## 2020-01-13 DIAGNOSIS — E669 Obesity, unspecified: Secondary | ICD-10-CM | POA: Insufficient documentation

## 2020-01-13 DIAGNOSIS — I480 Paroxysmal atrial fibrillation: Secondary | ICD-10-CM | POA: Insufficient documentation

## 2020-01-13 DIAGNOSIS — Y832 Surgical operation with anastomosis, bypass or graft as the cause of abnormal reaction of the patient, or of later complication, without mention of misadventure at the time of the procedure: Secondary | ICD-10-CM | POA: Diagnosis not present

## 2020-01-13 DIAGNOSIS — I120 Hypertensive chronic kidney disease with stage 5 chronic kidney disease or end stage renal disease: Secondary | ICD-10-CM | POA: Insufficient documentation

## 2020-01-13 DIAGNOSIS — Z8249 Family history of ischemic heart disease and other diseases of the circulatory system: Secondary | ICD-10-CM | POA: Diagnosis not present

## 2020-01-13 DIAGNOSIS — T82898A Other specified complication of vascular prosthetic devices, implants and grafts, initial encounter: Secondary | ICD-10-CM | POA: Diagnosis present

## 2020-01-13 DIAGNOSIS — N2581 Secondary hyperparathyroidism of renal origin: Secondary | ICD-10-CM | POA: Insufficient documentation

## 2020-01-13 DIAGNOSIS — Z888 Allergy status to other drugs, medicaments and biological substances status: Secondary | ICD-10-CM | POA: Diagnosis not present

## 2020-01-13 DIAGNOSIS — Z833 Family history of diabetes mellitus: Secondary | ICD-10-CM | POA: Insufficient documentation

## 2020-01-13 DIAGNOSIS — N186 End stage renal disease: Secondary | ICD-10-CM | POA: Insufficient documentation

## 2020-01-13 DIAGNOSIS — Z79899 Other long term (current) drug therapy: Secondary | ICD-10-CM | POA: Diagnosis not present

## 2020-01-13 DIAGNOSIS — E785 Hyperlipidemia, unspecified: Secondary | ICD-10-CM | POA: Insufficient documentation

## 2020-01-13 DIAGNOSIS — E1122 Type 2 diabetes mellitus with diabetic chronic kidney disease: Secondary | ICD-10-CM | POA: Insufficient documentation

## 2020-01-13 DIAGNOSIS — Z841 Family history of disorders of kidney and ureter: Secondary | ICD-10-CM | POA: Diagnosis not present

## 2020-01-13 HISTORY — DX: Personal history of other medical treatment: Z92.89

## 2020-01-13 HISTORY — DX: Constipation, unspecified: K59.00

## 2020-01-13 HISTORY — PX: REVISON OF ARTERIOVENOUS FISTULA: SHX6074

## 2020-01-13 LAB — POCT I-STAT, CHEM 8
BUN: 32 mg/dL — ABNORMAL HIGH (ref 8–23)
Calcium, Ion: 0.9 mmol/L — ABNORMAL LOW (ref 1.15–1.40)
Chloride: 97 mmol/L — ABNORMAL LOW (ref 98–111)
Creatinine, Ser: 8.3 mg/dL — ABNORMAL HIGH (ref 0.44–1.00)
Glucose, Bld: 70 mg/dL (ref 70–99)
HCT: 35 % — ABNORMAL LOW (ref 36.0–46.0)
Hemoglobin: 11.9 g/dL — ABNORMAL LOW (ref 12.0–15.0)
Potassium: 4.1 mmol/L (ref 3.5–5.1)
Sodium: 139 mmol/L (ref 135–145)
TCO2: 33 mmol/L — ABNORMAL HIGH (ref 22–32)

## 2020-01-13 LAB — GLUCOSE, CAPILLARY
Glucose-Capillary: 49 mg/dL — ABNORMAL LOW (ref 70–99)
Glucose-Capillary: 68 mg/dL — ABNORMAL LOW (ref 70–99)
Glucose-Capillary: 63 mg/dL — ABNORMAL LOW (ref 70–99)
Glucose-Capillary: 103 mg/dL — ABNORMAL HIGH (ref 70–99)
Glucose-Capillary: 48 mg/dL — ABNORMAL LOW (ref 70–99)
Glucose-Capillary: 119 mg/dL — ABNORMAL HIGH (ref 70–99)
Glucose-Capillary: 229 mg/dL — ABNORMAL HIGH (ref 70–99)

## 2020-01-13 LAB — CBC
HCT: 31.6 % — ABNORMAL LOW (ref 36.0–46.0)
Hemoglobin: 10 g/dL — ABNORMAL LOW (ref 12.0–15.0)
MCH: 31.4 pg (ref 26.0–34.0)
MCHC: 31.6 g/dL (ref 30.0–36.0)
MCV: 99.4 fL (ref 80.0–100.0)
Platelets: 114 10*3/uL — ABNORMAL LOW (ref 150–400)
RBC: 3.18 MIL/uL — ABNORMAL LOW (ref 3.87–5.11)
RDW: 14.7 % (ref 11.5–15.5)
WBC: 5.6 10*3/uL (ref 4.0–10.5)
nRBC: 0 % (ref 0.0–0.2)

## 2020-01-13 LAB — CREATININE, SERUM
Creatinine, Ser: 8.66 mg/dL — ABNORMAL HIGH (ref 0.44–1.00)
GFR calc Af Amer: 5 mL/min — ABNORMAL LOW (ref >60)
GFR calc non Af Amer: 4 mL/min — ABNORMAL LOW (ref >60)

## 2020-01-13 LAB — PROTIME-INR
INR: 1.1 (ref 0.8–1.2)
Prothrombin Time: 14.6 seconds (ref 11.4–15.2)

## 2020-01-13 SURGERY — REVISON OF ARTERIOVENOUS FISTULA
Anesthesia: General | Site: Arm Lower | Laterality: Left

## 2020-01-13 MED ORDER — PROPOFOL 10 MG/ML IV BOLUS
INTRAVENOUS | Status: AC
  Filled 2020-01-13: qty 40

## 2020-01-13 MED ORDER — SODIUM CHLORIDE 0.9% FLUSH
3.0000 mL | Freq: Two times a day (BID) | INTRAVENOUS | Status: DC
  Administered 2020-01-13 – 2020-01-14 (×3): 3 mL via INTRAVENOUS

## 2020-01-13 MED ORDER — MIDAZOLAM HCL 5 MG/5ML IJ SOLN
INTRAMUSCULAR | Status: DC | PRN
  Administered 2020-01-13: 2 mg via INTRAVENOUS

## 2020-01-13 MED ORDER — GABAPENTIN 100 MG PO CAPS
100.0000 mg | ORAL_CAPSULE | Freq: Every day | ORAL | Status: DC
  Administered 2020-01-13: 100 mg via ORAL
  Filled 2020-01-13: qty 1

## 2020-01-13 MED ORDER — POTASSIUM CHLORIDE CRYS ER 20 MEQ PO TBCR
20.0000 meq | EXTENDED_RELEASE_TABLET | Freq: Once | ORAL | Status: AC
  Administered 2020-01-13: 20 meq via ORAL
  Filled 2020-01-13: qty 1

## 2020-01-13 MED ORDER — LABETALOL HCL 5 MG/ML IV SOLN: 10.0000 mg | INTRAVENOUS | Status: DC | PRN

## 2020-01-13 MED ORDER — LIDOCAINE 2% (20 MG/ML) 5 ML SYRINGE
INTRAMUSCULAR | Status: AC
  Filled 2020-01-13: qty 5

## 2020-01-13 MED ORDER — FENTANYL CITRATE (PF) 100 MCG/2ML IJ SOLN
25.0000 ug | INTRAMUSCULAR | Status: DC | PRN
  Administered 2020-01-13: 25 ug via INTRAVENOUS

## 2020-01-13 MED ORDER — LIDOCAINE HCL 1 % IJ SOLN
INTRAMUSCULAR | Status: DC | PRN
  Administered 2020-01-13: 7.5 mL

## 2020-01-13 MED ORDER — 0.9 % SODIUM CHLORIDE (POUR BTL) OPTIME
TOPICAL | Status: DC | PRN
  Administered 2020-01-13: 1000 mL

## 2020-01-13 MED ORDER — LIDOCAINE 2% (20 MG/ML) 5 ML SYRINGE
INTRAMUSCULAR | Status: DC | PRN
  Administered 2020-01-13: 80 mg via INTRAVENOUS

## 2020-01-13 MED ORDER — DIALYVITE 3000 3 MG PO TABS: 1.0000 | ORAL_TABLET | Freq: Every evening | ORAL | Status: DC

## 2020-01-13 MED ORDER — SODIUM CHLORIDE 0.9 % IV SOLN
INTRAVENOUS | Status: DC | PRN
  Administered 2020-01-13: 500 mL

## 2020-01-13 MED ORDER — PHENYLEPHRINE HCL-NACL 10-0.9 MG/250ML-% IV SOLN
INTRAVENOUS | Status: DC | PRN
  Administered 2020-01-13: 50 ug/min via INTRAVENOUS

## 2020-01-13 MED ORDER — MIDAZOLAM HCL 2 MG/2ML IJ SOLN
INTRAMUSCULAR | Status: AC
  Filled 2020-01-13: qty 2

## 2020-01-13 MED ORDER — OXYCODONE HCL 5 MG PO TABS
ORAL_TABLET | ORAL | Status: AC
  Filled 2020-01-13: qty 1

## 2020-01-13 MED ORDER — GUAIFENESIN-DM 100-10 MG/5ML PO SYRP: 15.0000 mL | ORAL_SOLUTION | ORAL | Status: DC | PRN

## 2020-01-13 MED ORDER — LIDOCAINE HCL (PF) 1 % IJ SOLN
INTRAMUSCULAR | Status: AC
  Filled 2020-01-13: qty 30

## 2020-01-13 MED ORDER — DOCUSATE SODIUM 100 MG PO CAPS: 100.0000 mg | ORAL_CAPSULE | Freq: Two times a day (BID) | ORAL | Status: DC | PRN

## 2020-01-13 MED ORDER — FENTANYL CITRATE (PF) 100 MCG/2ML IJ SOLN
INTRAMUSCULAR | Status: AC
  Filled 2020-01-13: qty 2

## 2020-01-13 MED ORDER — HEPARIN SODIUM (PORCINE) 5000 UNIT/ML IJ SOLN: 5000.0000 [IU] | Freq: Three times a day (TID) | INTRAMUSCULAR | Status: DC

## 2020-01-13 MED ORDER — DIPHENHYDRAMINE HCL 50 MG/ML IJ SOLN
INTRAMUSCULAR | Status: DC | PRN
  Administered 2020-01-13: 12.5 mg via INTRAVENOUS

## 2020-01-13 MED ORDER — ONDANSETRON HCL 4 MG/2ML IJ SOLN
INTRAMUSCULAR | Status: AC
  Filled 2020-01-13: qty 2

## 2020-01-13 MED ORDER — BISACODYL 5 MG PO TBEC: 5.0000 mg | DELAYED_RELEASE_TABLET | Freq: Every day | ORAL | Status: DC | PRN

## 2020-01-13 MED ORDER — SUCROFERRIC OXYHYDROXIDE 500 MG PO CHEW
500.0000 mg | CHEWABLE_TABLET | ORAL | Status: DC | PRN
  Filled 2020-01-13: qty 1

## 2020-01-13 MED ORDER — PHENOL 1.4 % MT LIQD: 1.0000 | OROMUCOSAL | Status: DC | PRN

## 2020-01-13 MED ORDER — DOXYLAMINE SUCCINATE (SLEEP) 25 MG PO TABS
25.0000 mg | ORAL_TABLET | Freq: Every evening | ORAL | Status: DC | PRN
  Filled 2020-01-13: qty 1

## 2020-01-13 MED ORDER — HYDRALAZINE HCL 20 MG/ML IJ SOLN: 5.0000 mg | INTRAMUSCULAR | Status: DC | PRN

## 2020-01-13 MED ORDER — SODIUM CHLORIDE 0.9 % IV SOLN
INTRAVENOUS | Status: AC
  Filled 2020-01-13: qty 1.2

## 2020-01-13 MED ORDER — METOPROLOL TARTRATE 5 MG/5ML IV SOLN: 2.0000 mg | INTRAVENOUS | Status: DC | PRN

## 2020-01-13 MED ORDER — SODIUM CHLORIDE 0.9% FLUSH: 3.0000 mL | INTRAVENOUS | Status: DC | PRN

## 2020-01-13 MED ORDER — MIDODRINE HCL 5 MG PO TABS
10.0000 mg | ORAL_TABLET | Freq: Three times a day (TID) | ORAL | Status: DC
  Administered 2020-01-13 – 2020-01-14 (×3): 10 mg via ORAL
  Filled 2020-01-13 (×5): qty 2

## 2020-01-13 MED ORDER — DIPHENHYDRAMINE HCL 25 MG PO TABS
25.0000 mg | ORAL_TABLET | Freq: Every day | ORAL | Status: DC | PRN
  Filled 2020-01-13: qty 1

## 2020-01-13 MED ORDER — FENTANYL CITRATE (PF) 250 MCG/5ML IJ SOLN
INTRAMUSCULAR | Status: AC
  Filled 2020-01-13: qty 5

## 2020-01-13 MED ORDER — FENTANYL CITRATE (PF) 100 MCG/2ML IJ SOLN
INTRAMUSCULAR | Status: DC | PRN
  Administered 2020-01-13: 75 ug via INTRAVENOUS

## 2020-01-13 MED ORDER — CHLORHEXIDINE GLUCONATE 4 % EX LIQD: 60.0000 mL | Freq: Once | CUTANEOUS | Status: DC

## 2020-01-13 MED ORDER — OXYCODONE-ACETAMINOPHEN 5-325 MG PO TABS
1.0000 | ORAL_TABLET | Freq: Four times a day (QID) | ORAL | Status: DC | PRN
  Administered 2020-01-13: 1 via ORAL
  Filled 2020-01-13: qty 1

## 2020-01-13 MED ORDER — SODIUM CHLORIDE 0.9 % IV SOLN: INTRAVENOUS | Status: DC

## 2020-01-13 MED ORDER — DEXAMETHASONE SODIUM PHOSPHATE 10 MG/ML IJ SOLN
INTRAMUSCULAR | Status: AC
  Filled 2020-01-13: qty 1

## 2020-01-13 MED ORDER — OXYCODONE HCL 5 MG/5ML PO SOLN: 5.0000 mg | Freq: Once | ORAL | Status: AC | PRN

## 2020-01-13 MED ORDER — SUCROFERRIC OXYHYDROXIDE 500 MG PO CHEW
1000.0000 mg | CHEWABLE_TABLET | Freq: Three times a day (TID) | ORAL | Status: DC
  Administered 2020-01-13 – 2020-01-14 (×3): 1000 mg via ORAL
  Filled 2020-01-13 (×5): qty 2

## 2020-01-13 MED ORDER — ACETAMINOPHEN 325 MG PO TABS: 325.0000 mg | ORAL_TABLET | ORAL | Status: DC | PRN

## 2020-01-13 MED ORDER — SODIUM CHLORIDE 0.9 % IV SOLN: 250.0000 mL | INTRAVENOUS | Status: DC | PRN

## 2020-01-13 MED ORDER — HEPARIN SODIUM (PORCINE) 1000 UNIT/ML IJ SOLN
INTRAMUSCULAR | Status: DC | PRN
  Administered 2020-01-13: 5000 [IU] via INTRAVENOUS

## 2020-01-13 MED ORDER — CEFAZOLIN SODIUM-DEXTROSE 2-4 GM/100ML-% IV SOLN
INTRAVENOUS | Status: AC
  Filled 2020-01-13: qty 100

## 2020-01-13 MED ORDER — PROPOFOL 10 MG/ML IV BOLUS
INTRAVENOUS | Status: DC | PRN
  Administered 2020-01-13: 120 mg via INTRAVENOUS

## 2020-01-13 MED ORDER — OXYCODONE HCL 5 MG PO TABS: 5.0000 mg | ORAL_TABLET | Freq: Four times a day (QID) | ORAL | 0 refills | Status: DC | PRN

## 2020-01-13 MED ORDER — DIPHENHYDRAMINE HCL 50 MG/ML IJ SOLN
INTRAMUSCULAR | Status: AC
  Filled 2020-01-13: qty 1

## 2020-01-13 MED ORDER — PHENYLEPHRINE 40 MCG/ML (10ML) SYRINGE FOR IV PUSH (FOR BLOOD PRESSURE SUPPORT)
PREFILLED_SYRINGE | INTRAVENOUS | Status: DC | PRN
  Administered 2020-01-13 (×2): 200 ug via INTRAVENOUS

## 2020-01-13 MED ORDER — DEXTROSE 50 % IV SOLN
25.0000 mL | Freq: Once | INTRAVENOUS | Status: AC
  Administered 2020-01-13: 25 mL via INTRAVENOUS

## 2020-01-13 MED ORDER — ONDANSETRON HCL 4 MG/2ML IJ SOLN: 4.0000 mg | Freq: Once | INTRAMUSCULAR | Status: DC | PRN

## 2020-01-13 MED ORDER — RENA-VITE PO TABS
1.0000 | ORAL_TABLET | Freq: Every day | ORAL | Status: DC
  Administered 2020-01-13: 1 via ORAL
  Filled 2020-01-13: qty 1

## 2020-01-13 MED ORDER — DEXTROSE 50 % IV SOLN
INTRAVENOUS | Status: AC
  Administered 2020-01-13: 25 mL via INTRAVENOUS
  Filled 2020-01-13: qty 50

## 2020-01-13 MED ORDER — ONDANSETRON HCL 4 MG/2ML IJ SOLN: 4.0000 mg | Freq: Four times a day (QID) | INTRAMUSCULAR | Status: DC | PRN

## 2020-01-13 MED ORDER — PANTOPRAZOLE SODIUM 40 MG PO TBEC
40.0000 mg | DELAYED_RELEASE_TABLET | Freq: Every day | ORAL | Status: DC
  Administered 2020-01-13 – 2020-01-14 (×2): 40 mg via ORAL
  Filled 2020-01-13 (×2): qty 1

## 2020-01-13 MED ORDER — OXYCODONE HCL 5 MG PO TABS
5.0000 mg | ORAL_TABLET | Freq: Once | ORAL | Status: AC | PRN
  Administered 2020-01-13: 5 mg via ORAL

## 2020-01-13 MED ORDER — ONDANSETRON HCL 4 MG/2ML IJ SOLN
INTRAMUSCULAR | Status: DC | PRN
  Administered 2020-01-13: 4 mg via INTRAVENOUS

## 2020-01-13 MED ORDER — AMIODARONE HCL 200 MG PO TABS
200.0000 mg | ORAL_TABLET | Freq: Every day | ORAL | Status: DC
  Administered 2020-01-13: 200 mg via ORAL
  Filled 2020-01-13: qty 1

## 2020-01-13 MED ORDER — MUSCLE RUB 10-15 % EX CREA: 1.0000 "application " | TOPICAL_CREAM | CUTANEOUS | Status: DC | PRN

## 2020-01-13 MED ORDER — ACETAMINOPHEN 650 MG RE SUPP: 325.0000 mg | RECTAL | Status: DC | PRN

## 2020-01-13 MED ORDER — PROTAMINE SULFATE 10 MG/ML IV SOLN
INTRAVENOUS | Status: DC | PRN
  Administered 2020-01-13 (×2): 10 mg via INTRAVENOUS

## 2020-01-13 MED ORDER — ONDANSETRON HCL 4 MG PO TABS: 4.0000 mg | ORAL_TABLET | ORAL | Status: DC

## 2020-01-13 MED ORDER — CALCITRIOL 0.5 MCG PO CAPS
0.7500 ug | ORAL_CAPSULE | ORAL | Status: DC
  Filled 2020-01-13: qty 1

## 2020-01-13 MED ORDER — COLCHICINE 0.6 MG PO TABS
0.3000 mg | ORAL_TABLET | ORAL | Status: DC
  Filled 2020-01-13: qty 0.5

## 2020-01-13 MED ORDER — DEXTROSE 50 % IV SOLN: 25.0000 mL | Freq: Once | INTRAVENOUS | Status: AC

## 2020-01-13 MED ORDER — CEFAZOLIN SODIUM-DEXTROSE 2-4 GM/100ML-% IV SOLN
2.0000 g | INTRAVENOUS | Status: AC
  Administered 2020-01-13: 2 g via INTRAVENOUS

## 2020-01-13 SURGICAL SUPPLY — 40 items
ADH SKN CLS APL DERMABOND .7 (GAUZE/BANDAGES/DRESSINGS) ×1
AGENT HMST SPONGE THK3/8 (HEMOSTASIS)
ARMBAND PINK RESTRICT EXTREMIT (MISCELLANEOUS) ×2 IMPLANT
CANISTER SUCT 3000ML PPV (MISCELLANEOUS) ×2 IMPLANT
CLIP VESOCCLUDE MED 6/CT (CLIP) ×2 IMPLANT
CLIP VESOCCLUDE SM WIDE 6/CT (CLIP) ×2 IMPLANT
COVER PROBE W GEL 5X96 (DRAPES) IMPLANT
COVER WAND RF STERILE (DRAPES) ×2 IMPLANT
DECANTER SPIKE VIAL GLASS SM (MISCELLANEOUS) ×2 IMPLANT
DERMABOND ADVANCED (GAUZE/BANDAGES/DRESSINGS) ×1
DERMABOND ADVANCED .7 DNX12 (GAUZE/BANDAGES/DRESSINGS) ×1 IMPLANT
ELECT REM PT RETURN 9FT ADLT (ELECTROSURGICAL) ×2
ELECTRODE REM PT RTRN 9FT ADLT (ELECTROSURGICAL) ×1 IMPLANT
GLOVE BIO SURGEON STRL SZ7.5 (GLOVE) ×2 IMPLANT
GLOVE BIOGEL PI IND STRL 6.5 (GLOVE) IMPLANT
GLOVE BIOGEL PI IND STRL 8 (GLOVE) ×1 IMPLANT
GLOVE BIOGEL PI INDICATOR 6.5 (GLOVE) ×1
GLOVE BIOGEL PI INDICATOR 8 (GLOVE) ×1
GOWN STRL REUS W/ TWL LRG LVL3 (GOWN DISPOSABLE) ×2 IMPLANT
GOWN STRL REUS W/ TWL XL LVL3 (GOWN DISPOSABLE) ×2 IMPLANT
GOWN STRL REUS W/TWL LRG LVL3 (GOWN DISPOSABLE) ×4
GOWN STRL REUS W/TWL XL LVL3 (GOWN DISPOSABLE) ×4
HEMOSTAT SPONGE AVITENE ULTRA (HEMOSTASIS) IMPLANT
KIT BASIN OR (CUSTOM PROCEDURE TRAY) ×2 IMPLANT
KIT TURNOVER KIT B (KITS) ×2 IMPLANT
LOOP VESSEL MAXI BLUE (MISCELLANEOUS) ×1 IMPLANT
LOOP VESSEL MINI RED (MISCELLANEOUS) ×1 IMPLANT
NS IRRIG 1000ML POUR BTL (IV SOLUTION) ×2 IMPLANT
PACK CV ACCESS (CUSTOM PROCEDURE TRAY) ×2 IMPLANT
PAD ARMBOARD 7.5X6 YLW CONV (MISCELLANEOUS) ×4 IMPLANT
STAPLER VISISTAT 35W (STAPLE) IMPLANT
SUT MNCRL AB 4-0 PS2 18 (SUTURE) ×2 IMPLANT
SUT PROLENE 5 0 C 1 24 (SUTURE) ×1 IMPLANT
SUT PROLENE 6 0 BV (SUTURE) IMPLANT
SUT PROLENE 7 0 BV 1 (SUTURE) IMPLANT
SUT VIC AB 3-0 SH 27 (SUTURE) ×2
SUT VIC AB 3-0 SH 27X BRD (SUTURE) ×1 IMPLANT
TOWEL GREEN STERILE (TOWEL DISPOSABLE) ×2 IMPLANT
UNDERPAD 30X30 (UNDERPADS AND DIAPERS) ×2 IMPLANT
WATER STERILE IRR 1000ML POUR (IV SOLUTION) ×2 IMPLANT

## 2020-01-13 NOTE — Progress Notes (Signed)
Brenda Arnold has an out patient Hemodialysis spot at her home unit [facility] @ 12 30 pm  Friday 01/14/20  And she reports her friend will take her there tomorrow .   Ernest Haber PA-C [facility]

## 2020-01-13 NOTE — Anesthesia Preprocedure Evaluation (Signed)
Anesthesia Evaluation  Patient identified by MRN, date of birth, ID band Patient awake    Reviewed: Allergy & Precautions, NPO status , Patient's Chart, lab work & pertinent test results  Airway Mallampati: II  TM Distance: >3 FB Neck ROM: Full    Dental  (+) Teeth Intact, Dental Advisory Given   Pulmonary    breath sounds clear to auscultation       Cardiovascular hypertension,  Rhythm:Regular Rate:Normal     Neuro/Psych    GI/Hepatic   Endo/Other  diabetes  Renal/GU      Musculoskeletal   Abdominal (+) + obese,   Peds  Hematology   Anesthesia Other Findings   Reproductive/Obstetrics                             Anesthesia Physical Anesthesia Plan  ASA: III  Anesthesia Plan: General   Post-op Pain Management:    Induction: Intravenous  PONV Risk Score and Plan: Ondansetron and Dexamethasone  Airway Management Planned: LMA  Additional Equipment:   Intra-op Plan:   Post-operative Plan:   Informed Consent: I have reviewed the patients History and Physical, chart, labs and discussed the procedure including the risks, benefits and alternatives for the proposed anesthesia with the patient or authorized representative who has indicated his/her understanding and acceptance.     Dental advisory given  Plan Discussed with: CRNA and Anesthesiologist  Anesthesia Plan Comments:         Anesthesia Quick Evaluation  

## 2020-01-13 NOTE — Discharge Instructions (Signed)
[facility]  Discharge Instructions  AV Fistula or Graft Surgery for Dialysis Access  Please refer to the following instructions for your post-procedure care. Your surgeon or physician assistant will discuss any changes with you.  Activity  You may drive the day following your surgery, if you are comfortable and no longer taking prescription pain medication. Resume full activity as the soreness in your incision resolves.  Bathing/Showering  You may shower after you go home. Keep your incision dry for 48 hours. Do not soak in a bathtub, hot tub, or swim until the incision heals completely. You may not shower if you have a hemodialysis catheter.  Incision Care  Clean your incision with mild soap and water after 48 hours. Pat the area dry with a clean towel. You do not need a bandage unless otherwise instructed. Do not apply any ointments or creams to your incision. You may have skin glue on your incision. Do not peel it off. It will come off on its own in about one week. Your arm may swell a bit after surgery. To reduce swelling use pillows to elevate your arm so it is above your heart. Your doctor will tell you if you need to lightly wrap your arm with an ACE bandage.  Diet  Resume your normal diet. There are not special food restrictions following this procedure. In order to heal from your surgery, it is CRITICAL to get adequate nutrition. Your body requires vitamins, minerals, and protein. Vegetables are the best source of vitamins and minerals. Vegetables also provide the perfect balance of protein. Processed food has little nutritional value, so try to avoid this.  Medications  Resume taking all of your medications. If your incision is causing pain, you may take over-the counter pain relievers such as acetaminophen (Tylenol). If you were prescribed a stronger pain medication, please be aware these medications can cause nausea and constipation. Prevent  nausea by taking the medication with a snack or meal. Avoid constipation by drinking plenty of fluids and eating foods with high amount of fiber, such as fruits, vegetables, and grains.  Do not take Tylenol if you are taking prescription pain medications.  Follow up Your surgeon may want to see you in the office following your access surgery. If so, this will be arranged at the time of your surgery.  Please call us immediately for any of the following conditions:  . Increased pain, redness, drainage (pus) from your incision site . Fever of 101 degrees or higher . Severe or worsening pain at your incision site . Hand pain or numbness. .  Reduce your risk of vascular disease:  . Stop smoking. If you would like help, call [facility] at 1-800-QUIT-NOW 680-102-2408) or [place] at 437-754-9410  . Manage your cholesterol . Maintain a desired weight . Control your diabetes . Keep your blood pressure down  Dialysis  It will take several weeks to several months for your new dialysis access to be ready for use. Your surgeon will determine when it is okay to use it. Your nephrologist will continue to direct your dialysis. You can continue to use your Permcath until your new access is ready for use.   01/13/2020 Brenda Arnold [id number] 1956-06-24  Surgeon(s): Marty Heck, MD  Procedure(s): REVISON OF LEFT ARM ARTERIOVENOUS FISTULA  x May stick graft on designated area only:  Do NOT stick over incision for 5 weeks. May stick above and below incision immediately. SEE DIAGRAM.  If you have any questions, please call the office at [phone].

## 2020-01-13 NOTE — H&P (Signed)
History and Physical Interval Note:  01/13/2020 7:24 AM  Brenda Arnold  has presented today for surgery, with the diagnosis of END STAGE RENAL DISEASE.  The various methods of treatment have been discussed with the patient and family. After consideration of risks, benefits and other options for treatment, the patient has consented to  Procedure(s): REVISON OF ARTERIOVENOUS FISTULA (Left) as a surgical intervention.  The patient's history has been reviewed, patient examined, no change in status, stable for surgery.  I have reviewed the patient's chart and labs.  Questions were answered to the patient's satisfaction.    Left arm AVF revision for ulceration over radiocephalic AVF.  Brenda Arnold  Patient name: Brenda Arnold MRN: [id number] DOB: 1955/12/19 Sex: female  REASON FOR CONSULT: Evaluate scab over left arm AV fistula  HPI:  Brenda Arnold is a 64 y.o. female, with history of end-stage renal disease on hemodialysis Monday Wednesday Friday, A. fib on Eliquis, hypertension, hyperlipidemia, diabetes that presents for evaluation of a scab on her left arm AV fistula. Patient states she has had recurrent scabs and ulcerations here over multiple occcasions since last year. She has a left radiocephalic AV fistula that was placed with Dr. Oneida Alar in 2013. She then had sidebranch ligation in 2015. She is not aware of any other revisions. She states this fistula has worked very well. She is on Eliquis given a history of A. fib diagnosed last year around the time of hip surgery.      Past Medical History:  Diagnosis Date  . Anemia   . Arthritis   . Asthma   . Complication of anesthesia    difficulty with getting oxygen saturation up  . Diabetes mellitus   . Dyslipidemia   . Epistaxis 11/05/2012  . ESRD (end stage renal disease) on dialysis [facility])    "MWF; Jeneen Rinks" (09/15/2018)  . History of nuclear stress test    Myoview 5/18: EF 68, no infarct or ischemia, low risk  . HTN  (hypertension)   . Hyperlipidemia   . Hyperparathyroidism due to renal insufficiency ([place])   . Obesity    s/p panniculectomy  . Paroxysmal A-fib (HCC)    a. chronic coumadin; b. 12/2009 Echo: EF 60-65%, Gr 1 DD.  Marland Kitchen PCOS (polycystic ovarian syndrome)   . Pericarditis 08/2019  . Pneumonia   . Seasonal allergies   . Sleep apnea    a. not using CPAP, last study >8 yrs  . Vitamin D deficiency         Past Surgical History:  Procedure Laterality Date  . ABDOMINAL HYSTERECTOMY     with panniculctomy  . AV FISTULA PLACEMENT  09/02/2012   Procedure: ARTERIOVENOUS (AV) FISTULA CREATION; Surgeon: Elam Dutch, MD; Location: [facility] OR; Service: Vascular; Laterality: Left; Creation of Left Radial-Cephalic Fistula   . BREAST SURGERY     Biopsy right breast  . COLONOSCOPY W/ BIOPSIES AND POLYPECTOMY    . DILATION AND CURETTAGE OF UTERUS    . KNEE ARTHROSCOPY Right   . REVISON OF ARTERIOVENOUS FISTULA Left 04/27/2014   Procedure: REVISON OF LEFT RADIAL-CEPHALIC ARTERIOVENOUS FISTULA; Surgeon: Mal Misty, MD; Location: [place]; Service: Vascular; Laterality: Left;  . TOTAL HIP ARTHROPLASTY Right 09/15/2018  . TOTAL HIP ARTHROPLASTY Right 09/15/2018   Procedure: RIGHT TOTAL HIP ARTHROPLASTY ANTERIOR APPROACH; Surgeon: Leandrew Koyanagi, MD; Location: [place]; Service: Orthopedics; Laterality: Right;  . UVULOPLASTY    . VIDEO ASSISTED THORACOSCOPY (VATS)/WEDGE RESECTION Right 08/31/2019   Procedure:  VIDEO ASSISTED THORACOSCOPY/ DRAINAGE OF PERICARDIAL EFFUSION/ PERICARDIAL WINDOW/ ABORTED PERICARDIOCENTESIS ; Surgeon: Lajuana Matte, MD; Location: [facility] OR; Service: Thoracic; Laterality: Right;        Family History  Problem Relation Age of Onset  . Lung cancer Father    died @ 40  . Hypertension Mother   . Diabetes Brother   . Kidney disease Brother    SOCIAL HISTORY:  Social History        Socioeconomic History  . Marital status: Single    Spouse name: Not on file  . Number of children:  Not on file  . Years of education: Not on file  . Highest education level: Not on file  Occupational History  . Occupation: Dance movement psychotherapist.    Employer: [employer]  Tobacco Use  . Smoking status: Never Smoker  . Smokeless tobacco: Never Used  Substance and Sexual Activity  . Alcohol use: No    Alcohol/week: 0.0 standard drinks  . Drug use: No  . Sexual activity: Not on file  Other Topics Concern  . Not on file  Social History Narrative   Lives in [place] alone. 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   Social Determinants of Health      Financial Resource Strain:   . Difficulty of Paying Living Expenses: Not on file  Food Insecurity:   . Worried About Charity fundraiser in the Last Year: Not on file  . Ran Out of Food in the Last Year: Not on file  Transportation Needs:   . Lack of Transportation (Medical): Not on file  . Lack of Transportation (Non-Medical): Not on file  Physical Activity:   . Days of Exercise per Week: Not on file  . Minutes of Exercise per Session: Not on file  Stress:   . Feeling of Stress : Not on file  Social Connections:   . Frequency of Communication with Friends and Family: Not on file  . Frequency of Social Gatherings with Friends and Family: Not on file  . Attends Religious Services: Not on file  . Active Member of Clubs or Organizations: Not on file  . Attends Archivist Meetings: Not on file  . Marital Status: Not on file  Intimate Partner Violence:   . Fear of Current or Ex-Partner: Not on file  . Emotionally Abused: Not on file  . Physically Abused: Not on file  . Sexually Abused: Not on file        Allergies  Allergen Reactions  . Iodine Shortness Of Breath  . Shellfish Allergy Shortness Of Breath  . Norvasc [Amlodipine] Swelling    SWELLING REACTION UNSPECIFIED   . Naltrexone Other (See Comments)         Current Outpatient Medications  Medication Sig Dispense Refill  .  amiodarone (PACERONE) 200 MG tablet TAKE 1 TABLET BY MOUTH EVERY DAY 90 tablet 1  . calcitRIOL (ROCALTROL) 0.25 MCG capsule Take 3 capsules (0.75 mcg total) by mouth every Monday, Wednesday, and Friday with hemodialysis.    Marland Kitchen colchicine 0.6 MG tablet TAKE 0.5 TABLET BY MOUTH EVERY MONDAY, WEDNESDAY, AND FRIDAY WITH HEMODIALYSIS. 45 tablet 1  . docusate sodium (COLACE) 100 MG capsule Take 1 capsule (100 mg total) by mouth 2 (two) times daily. 60 capsule 0  . ELIQUIS 5 MG TABS tablet Take 5 mg by mouth 2 (two) times daily.     . Etelcalcetide HCl (PARSABIV IV) Etelcalcetide (Parsabiv)    . ethyl chloride spray     .  gabapentin (NEURONTIN) 100 MG capsule Take 100 mg by mouth at bedtime.  3  . Menthol-Methyl Salicylate (MUSCLE RUB) 10-15 % CREA Apply 1 application topically as needed for muscle pain.  0  . midodrine (PROAMATINE) 10 MG tablet Take 1 tablet (10 mg total) by mouth 3 (three) times daily with meals.    . multivitamin (RENA-VIT) TABS tablet Take 1 tablet by mouth at bedtime.  0  . ondansetron (ZOFRAN) 4 MG tablet SMARTSIG:1 Tablet(s) By Mouth 3 Times a Week PRN    . sucroferric oxyhydroxide (VELPHORO) 500 MG chewable tablet Chew 1,000 mg by mouth 3 (three) times daily.    . predniSONE (DELTASONE) 10 MG tablet PLEASE SEE ATTACHED FOR DETAILED DIRECTIONS     No current facility-administered medications for this visit.   REVIEW OF SYSTEMS:  [X]  denotes positive finding, [ ]  denotes negative finding  Cardiac  Comments:  Chest pain or chest pressure:    Shortness of breath upon exertion:    Short of breath when lying flat:    Irregular heart rhythm:        Vascular    Pain in calf, thigh, or hip brought on by ambulation:    Pain in feet at night that wakes you up from your sleep:     Blood clot in your veins:    Leg swelling:         Pulmonary    Oxygen at home:    Productive cough:     Wheezing:         Neurologic    Sudden weakness in arms or legs:     Sudden numbness in  arms or legs:     Sudden onset of difficulty speaking or slurred speech:    Temporary loss of vision in one eye:     Problems with dizziness:         Gastrointestinal    Blood in stool:     Vomited blood:         Genitourinary    Burning when urinating:     Blood in urine:        Psychiatric    Major depression:         Hematologic    Bleeding problems:    Problems with blood clotting too easily:        Skin    Rashes or ulcers:        Constitutional    Fever or chills:    PHYSICAL EXAM:     Vitals:   01/04/20 1523  BP: (!) 83/50  Pulse: 67  Resp: 16  Temp: (!) 97.2 F (36.2 C)  TempSrc: Temporal  SpO2: 100%  Weight: 214 lb (97.1 kg)  Height: 5\' 6"  (1.676 m)   GENERAL: The patient is a well-nourished female, in no acute distress. The vital signs are documented above.  CARDIAC: There is a regular rate and rhythm.  VASCULAR:  Left radiocephalic fistula good thrill  Two tandem aneurysms over left radiocephalic fistula  More distal aneurysm in the forearm with overlying ulceration and scab  PULMONARY: There is good air exchange bilaterally without wheezing or rales.  ABDOMEN: Soft and non-tender with normal pitched bowel sounds.  MUSCULOSKELETAL: There are no major deformities or cyanosis.  NEUROLOGIC: No focal weakness or paresthesias are detected.  SKIN: There are no ulcers or rashes noted.  PSYCHIATRIC: The patient has a normal affect.  DATA:  None  Assessment/Plan:  64 year old female with end-stage renal disease that presents with  ulcerated scab over her left radiocephalic AV fistula with underlying aneurysm in the distal forearm. I offered her left arm AV fistula revision with aneurysm plication in order to prevent any future bleeding risk. We will schedule this on a nondialysis day and she is currently dialyzing on Monday Wednesday Friday. We have asked that she hold her Eliquis for 48 hours prior to surgery. She has a second aneurysm on the more proximal  forearm with hypopigmentation but discussed I would leave that alone for now given no overt ulceration and this will allow dialysis to have an access point following her revision given that she does not want a catheter. Risk and benefits discussed.  Brenda Heck, MD  [facility]  Office: [phone]

## 2020-01-13 NOTE — Anesthesia Postprocedure Evaluation (Signed)
Anesthesia Post Note  Patient: Cecille Amsterdam Sandiford  Procedure(s) Performed: REVISON OF ARTERIOVENOUS FISTULA (Left Arm Lower)     Patient location during evaluation: PACU Anesthesia Type: General Level of consciousness: awake and alert Pain management: pain level controlled Vital Signs Assessment: post-procedure vital signs reviewed and stable Respiratory status: spontaneous breathing, nonlabored ventilation, respiratory function stable and patient connected to nasal cannula oxygen Cardiovascular status: blood pressure returned to baseline and stable Postop Assessment: no apparent nausea or vomiting Anesthetic complications: no    Last Vitals:  Vitals:   01/13/20 1537 01/13/20 1925  BP: (!) 95/45 (!) 102/51  Pulse: 62 70  Resp: 17 17  Temp: 36.5 C 36.9 C  SpO2: 100% 100%    Last Pain:  Vitals:   01/13/20 1537  TempSrc: Oral  PainSc:                  Kiet Geer COKER

## 2020-01-13 NOTE — Progress Notes (Signed)
CBG rechecked. Result 68. Dr. Linna Caprice at bedside and made aware. Will continue to monitor.

## 2020-01-13 NOTE — Transfer of Care (Signed)
Immediate Anesthesia Transfer of Care Note  Patient: Brenda Arnold  Procedure(s) Performed: REVISON OF ARTERIOVENOUS FISTULA (Left Arm Lower)  Patient Location: PACU  Anesthesia Type:General  Level of Consciousness: awake, alert  and oriented  Airway & Oxygen Therapy: Patient Spontanous Breathing  Post-op Assessment: Report given to RN and Post -op Vital signs reviewed and stable  Post vital signs: Reviewed and stable  Last Vitals:  Vitals Value Taken Time  BP 90/52 01/13/20 0909  Temp    Pulse 78 01/13/20 0911  Resp 13 01/13/20 0911  SpO2 100 % 01/13/20 0911  Vitals shown include unvalidated device data.  Last Pain: There were no vitals filed for this visit.       Complications: No apparent anesthesia complications

## 2020-01-13 NOTE — Progress Notes (Signed)
Patient has CBG of 49 upon arrival to short stay.  Dr. Marcie Bal notified.  Received order for 1/2 amp of D50.  Will recheck CBG in 15 minutes after administration.

## 2020-01-13 NOTE — Progress Notes (Signed)
Pt is s/p revision of left RC AVF with aneurysm plication and resection of overlying ulcer today by Dr. Carlis Abbott.   Pt does not have anyone to stay with her for 24 hrs, so will admit for observation.  She is scheduled for HD tomorrow at 0500.  I called Dr. Melvia Heaps and they will re-arrange her dialysis times for tomorrow.   Plan for dc in the am.    Leontine Locket, [facility] 01/13/2020 9:46 AM

## 2020-01-13 NOTE — Op Note (Signed)
Date: January 13, 2020  Preoperative diagnosis: Ulcerated aneurysmal left arm radiocephalic AV fistula  Postoperative diagnosis: Same  Procedure: Revision of left arm radiocephalic AV fistula with aneurysm plication and resection of overlying ulcer  Surgeon: Dr. Marty Heck, MD  Assistant: Leontine Locket, PA  Indication: Patient is a 64 year old female with end-stage renal disease that currently gets dialysis via a left arm radiocephalic AV fistula.  She was recently evaluated in clinic for an ulcerated segment over left radiocephalic AV fistula.  She presents for fistula revision with aneurysm plication and resection of overlying ulcer.  She has a second area more proximal where the skin is intact but hypopigmented and we are not revising that segment at this time.  Risk benefits discussed.  Findings: Elliptical incision was placed over the ulcerated segment of the left radiocephalic AVF.  The underlying aneurysmal segment of the fistula was fully mobilized.  Subsequently resected part of the wall of the aneurysm and the fistula was plicated.  This included resecting the overlying ulcerated segments.  She has an excellent thrill upon completion.  Anesthesia: LMA  EBL: Minimal  Details: Patient was taken to the operating room after informed consent was obtained.  She was placed on operative table supine position.  Anesthesia was induced.  Left arm was prepped and draped in sterile fashion.  Initially made a elliptical incision over ulcerated segment of aneurysmal left radiocephalic AV fistula.  Dissected down with Bovie cautery and the underlying segment of the fistula was fully mobilized.  Ultimately got proximal and distal control with Vesseloops.  Patient was given 5000 units of IV heparin.  I then used fistula clamps for proximal and distal control.  The ulcer and anterior wall of the aneurysmal fistula was then resected with Metzenbaum scissors.  I then plicated the fistula and  sewed the wall of the fistula back together with 5-0 Prolene.  It was de-aired prior to completion.  Once we came off clamps there was good hemostasis.  Patient was given protamine for reversal.  Hemostasis was obtained.  I then reapproximated the subcutaneous tissue with 3-0 Vicryl and skin was closed with 4-0 Monocryl and Dermabond applied.  I did mark with a black marker where dialysis can access proximal and distal to the revision.   Complication: None  Condition: Stable  Marty Heck, MD [facility] [place] Office: [place]

## 2020-01-13 NOTE — Anesthesia Procedure Notes (Signed)
Procedure Name: Intubation Date/Time: 01/13/2020 7:41 AM Performed by: Griffin Dakin, CRNA Pre-anesthesia Checklist: Patient identified, Emergency Drugs available, Suction available and Patient being monitored Patient Re-evaluated:Patient Re-evaluated prior to induction Oxygen Delivery Method: Circle system utilized Preoxygenation: Pre-oxygenation with 100% oxygen Induction Type: IV induction Ventilation: Mask ventilation without difficulty and Oral airway inserted - appropriate to patient size LMA: LMA inserted LMA Size: 4.0 Tube type: Oral Number of attempts: 1 Airway Equipment and Method: Oral airway Placement Confirmation: positive ETCO2 and breath sounds checked- equal and bilateral Tube secured with: Tape Dental Injury: Teeth and Oropharynx as per pre-operative assessment

## 2020-01-13 NOTE — Plan of Care (Signed)

## 2020-01-14 DIAGNOSIS — T82898A Other specified complication of vascular prosthetic devices, implants and grafts, initial encounter: Secondary | ICD-10-CM | POA: Diagnosis not present

## 2020-01-14 LAB — GLUCOSE, CAPILLARY
Glucose-Capillary: 69 mg/dL — ABNORMAL LOW (ref 70–99)
Glucose-Capillary: 116 mg/dL — ABNORMAL HIGH (ref 70–99)

## 2020-01-14 MED ORDER — OXYCODONE HCL 5 MG PO TABS: 5.0000 mg | ORAL_TABLET | Freq: Four times a day (QID) | ORAL | 0 refills | Status: DC | PRN

## 2020-01-14 MED FILL — oxyCODONE HCL 5 MG TABS: 5 | 3 days supply | Qty: 10 | Fill #0

## 2020-01-14 NOTE — Discharge Summary (Signed)
Discharge Summary  Patient ID: Brenda Arnold [id number] 64 y.o. [birth year]  Admit date: 01/13/2020  Discharge date and time: 01/14/20   Admitting Physician: Marty Heck, MD   Discharge Physician: same  Admission Diagnoses: ESRD (end stage renal disease) [facility]) [N18.6]  Discharge Diagnoses: same  Admission Condition: fair  Discharged Condition: fair  Indication for Admission: Ulcerated aneurysmal left arm radiocephalic AV fistula  Hospital Course: Ms. Brenda Arnold is a 64 year old female who was brought in as an outpatient for revision of left radiocephalic fistula by plication due to ulcerated aneurysmal segment.  This was performed by Dr. Carlis Abbott on 01/13/2020.  She was admitted for observation due to lack of transportation and support.  Nephrology was notified and arranged outpatient hemodialysis on 01/14/2020 at 12:30 PM.  POD #1 left radiocephalic fistula patent with a palpable thrill.  Acceptable stick zones were marked with marker on the patient's arm and area of incision will be avoided for at least 4 weeks.  She will follow-up in office in about 2 to 3 weeks for incision check.  She will be discharged home today in stable condition.  Consults: None  Treatments: surgery: documented above  Discharge Exam: see progress note 01/14/20 Vitals:   01/14/20 0557 01/14/20 0559  BP: (!) 89/50 (!) 89/50  Pulse: 69 69  Resp:  16  Temp: 98.6 F (37 C) 98.6 F (37 C)  SpO2: 100% 100%     Disposition: Discharge disposition: 01-Home or Self Care       Patient Instructions:  Allergies as of 01/14/2020      Reactions   Iodine Shortness Of Breath   Shellfish Allergy Shortness Of Breath   Norvasc [amlodipine] Swelling      Medication List    STOP taking these medications   multivitamin Tabs tablet     TAKE these medications   acetaminophen 650 MG CR tablet Commonly known as: TYLENOL Take 650 mg by mouth every 8 (eight) hours as needed for pain.     amiodarone 200 MG tablet Commonly known as: PACERONE TAKE 1 TABLET BY MOUTH EVERY DAY What changed: when to take this   bisacodyl 5 MG EC tablet Commonly known as: DULCOLAX Take 5 mg by mouth daily as needed for moderate constipation.   calcitRIOL 0.25 MCG capsule Commonly known as: ROCALTROL Take 3 capsules (0.75 mcg total) by mouth every Monday, Wednesday, and Friday with hemodialysis.   colchicine 0.6 MG tablet TAKE 0.5 TABLET BY MOUTH EVERY MONDAY, WEDNESDAY, AND FRIDAY WITH HEMODIALYSIS. What changed: See the new instructions.   diphenhydrAMINE 25 MG tablet Commonly known as: BENADRYL Take 25 mg by mouth daily as needed for itching.   docusate sodium 100 MG capsule Commonly known as: COLACE Take 1 capsule (100 mg total) by mouth 2 (two) times daily. What changed:   when to take this  reasons to take this   doxylamine (Sleep) 25 MG tablet Commonly known as: UNISOM Take 25 mg by mouth at bedtime as needed for sleep.   Eliquis 5 MG Tabs tablet Generic drug: apixaban Take 5 mg by mouth 2 (two) times daily.   ethyl chloride spray Apply 1 application topically daily as needed (port access).   folic acid-vitamin b complex-vitamin c-selenium-zinc 3 MG Tabs tablet Take 1 tablet by mouth every evening.   gabapentin 100 MG capsule Commonly known as: NEURONTIN Take 100 mg by mouth at bedtime.   midodrine 10 MG tablet Commonly known as: PROAMATINE Take 1 tablet (10 mg total) by  mouth 3 (three) times daily with meals. What changed:   when to take this  additional instructions   Muscle Rub 10-15 % Crea Apply 1 application topically as needed for muscle pain.   ondansetron 4 MG tablet Commonly known as: ZOFRAN Take 4 mg by mouth See admin instructions. Take 4 mg after dialysis Mon, Wed, and Fri   oxyCODONE 5 MG immediate release tablet Commonly known as: Roxicodone Take 1 tablet (5 mg total) by mouth every 6 (six) hours as needed for severe pain.   PARSABIV  IV Inject 1 Dose as directed every Monday, Wednesday, and Friday with hemodialysis.   Velphoro 500 MG chewable tablet Generic drug: sucroferric oxyhydroxide Chew 500-1,000 mg by mouth See admin instructions. Take 1000 mg 3 times daily with meals and 500 mg daily with a snack.      Activity: activity as tolerated Diet: renal diet Wound Care: keep wound clean and dry  Follow-up with Dr. Carlis Abbott in 2 weeks.  Signed: Dagoberto Ligas, PA-C 01/14/2020 9:13 AM [facility] Office: [phone]

## 2020-01-14 NOTE — Progress Notes (Signed)
[facility]  Subjective  - No complaints.  Pain well controlled.   Objective (!) 89/50 69 98.6 F (37 C) (Oral) 16 100%  Intake/Output Summary (Last 24 hours) at 01/14/2020 0854 Last data filed at 01/13/2020 1900 Gross per 24 hour  Intake 363 ml  Output --  Net 363 ml    Left radiocephalic AVF with good thrill, incision c/d/i No hematoma  Laboratory Lab Results: Recent Labs    01/13/20 0642 01/13/20 1233  WBC  --  5.6  HGB 11.9* 10.0*  HCT 35.0* 31.6*  PLT  --  114*   BMET Recent Labs    01/13/20 0642 01/13/20 1233  NA 139  --   K 4.1  --   CL 97*  --   GLUCOSE 70  --   BUN 32*  --   CREATININE 8.30* 8.66*    COAG Lab Results  Component Value Date   INR 1.1 01/13/2020   INR 1.97 11/09/2018   INR 1.64 11/08/2018   No results found for: PTT  Assessment/Planning: POD #1 s/p left radiocephalic AVF revision with aneurysm plication.  Plan for d/c today so she can get to her outpatient dialysis appointment.  Should have room above and below revision incision for access.  Will arrange wound check in our clinic.  Marty Heck 01/14/2020 8:54 AM --

## 2020-01-14 NOTE — Plan of Care (Signed)

## 2020-01-21 ENCOUNTER — Encounter: Payer: Self-pay | Admitting: Cardiology

## 2020-01-21 ENCOUNTER — Other Ambulatory Visit: Payer: Self-pay

## 2020-01-21 ENCOUNTER — Ambulatory Visit (INDEPENDENT_AMBULATORY_CARE_PROVIDER_SITE_OTHER): Payer: Medicare Other | Admitting: Cardiology

## 2020-01-21 VITALS — BP 100/54 | HR 94 | Ht 66.0 in | Wt 215.2 lb

## 2020-01-21 DIAGNOSIS — I480 Paroxysmal atrial fibrillation: Secondary | ICD-10-CM | POA: Diagnosis not present

## 2020-01-21 DIAGNOSIS — E119 Type 2 diabetes mellitus without complications: Secondary | ICD-10-CM | POA: Diagnosis not present

## 2020-01-21 DIAGNOSIS — L03011 Cellulitis of right finger: Secondary | ICD-10-CM | POA: Insufficient documentation

## 2020-01-21 DIAGNOSIS — I319 Disease of pericardium, unspecified: Secondary | ICD-10-CM | POA: Diagnosis not present

## 2020-01-21 DIAGNOSIS — N186 End stage renal disease: Secondary | ICD-10-CM

## 2020-01-21 DIAGNOSIS — E7800 Pure hypercholesterolemia, unspecified: Secondary | ICD-10-CM

## 2020-01-21 NOTE — Patient Instructions (Signed)
Medication Instructions:  Your physician has recommended you make the following change in your medication: 1) STOP taking amiodarone   *If you need a refill on your cardiac medications before your next appointment, please call your pharmacy*  Testing/Procedures: Your physician has requested that you have an echocardiogram. Echocardiography is a painless test that uses sound waves to create images of your heart. It provides your doctor with information about the size and shape of your heart and how well your heart's chambers and valves are working. This procedure takes approximately one hour. There are no restrictions for this procedure.   Follow-Up: At [facility], you and your health needs are our priority.  As part of our continuing mission to provide you with exceptional heart care, we have created designated Provider Care Teams.  These Care Teams include your primary Cardiologist (physician) and Advanced Practice Providers (APPs -  Physician Assistants and Nurse Practitioners) who all work together to provide you with the care you need, when you need it.  Your next appointment:   6 month(s)  The format for your next appointment:   In Person  Provider:   Fransico Him, MD

## 2020-01-21 NOTE — Progress Notes (Signed)
Cardiology Office Note:    Date:  01/21/2020   ID:  Brenda Arnold, DOB [birth year], MRN [id number]  PCP:  Kelton Pillar, MD  Cardiologist:  Fransico Him, MD    Referring MD: Kelton Pillar, MD   Chief Complaint  Patient presents with  . Follow-up    Pericarditis, PAF, HLD    History of Present Illness:    Brenda Arnold is a 64 y.o. female with a hx of hypertension, PAF,ESRD on HD, and obesity.She had a Myoview secondary to chest pain in the setting of atrial fibrillation 2011 that showed inferolateral lateral ischemia. Heart catheterization was deferred and medical therapy was recommended.  She underwent Myoview stress test on 5/17/18which showed no reversible ischemia.    The patient was seen in the hospital in early October 2020 with symptoms consistent with acute pericarditis (pleuritc chest pain, elevated inflammatory markers, diffuse ST elevation on EKG), treated with colchicine. Initial TTE with trivial effusion. On 9/29 developed hypotension and repeat TTE showed large effusion with findings concerning for tamponade. S/p pericardial window with Dr Kipp Brood on 9/29. Cultures no growth, cytology with no malignant cells. Continue colchicine with HD.  She developed atrial fibrillation with RVR and was started on amiodarone for rhythm control and risks for stroke risk reduction.  She was maintaining sinus rhythm at the time of discharge.  She went to inpatient rehab.  She went to the ED on 10/06/2019 with complaints of pleuritic chest pain.  There were no ischemic changes on EKG and troponins were normal.  She was not felt to be volume overloaded and potassium was normal.  Seen back in the office by extender and Bp was stable on Midodrine and CP with breathing had improved but still had some pleuritic CP with deep inspiration.  She was continued on Colchicine.  She was maintaining NSR on Amio and midodrine for hypotension on HD.   Past Medical History:  Diagnosis  Date  . Anemia   . Arthritis   . Asthma   . Complication of anesthesia    difficulty with getting oxygen saturation up- "patient was not aware"  Bottom of feet burning in PACU  . Constipation    because of Iron  . Diabetes mellitus    not on meds  . DVT (deep venous thrombosis) ([place]) 2019   post hip replacement - right  . Dyslipidemia   . Epistaxis 11/05/2012  . ESRD (end stage renal disease) on dialysis [facility])    "MWF; [facility]" (09/15/2018)- started 02/13/2017  . History of blood transfusion   . HTN (hypertension)   . Hyperparathyroidism due to renal insufficiency ([place])   . Obesity    s/p panniculectomy  . Paroxysmal A-fib (HCC)    a. chronic coumadin;  b. 12/2009 Echo: EF 60-65%, Gr 1 DD.  Marland Kitchen PCOS (polycystic ovarian syndrome)   . Pericarditis 08/2019  . Pneumonia   . Seasonal allergies   . Sleep apnea    a. not using CPAP, last study  >8 yrs  . Vitamin D deficiency     Past Surgical History:  Procedure Laterality Date  . ABDOMINAL HYSTERECTOMY     with panniculctomy  . AV FISTULA PLACEMENT  09/02/2012   Procedure: ARTERIOVENOUS (AV) FISTULA CREATION;  Surgeon: Elam Dutch, MD;  Location: [facility] OR;  Service: Vascular;  Laterality: Left;  Creation of Left Radial-Cephalic Fistula   . BREAST SURGERY     Biopsy right breast  . COLONOSCOPY    . COLONOSCOPY W/ BIOPSIES  AND POLYPECTOMY    . DILATION AND CURETTAGE OF UTERUS    . KNEE ARTHROSCOPY Right   . PANNICULECTOMY    . REVISON OF ARTERIOVENOUS FISTULA Left 04/27/2014   Procedure: REVISON OF LEFT RADIAL-CEPHALIC ARTERIOVENOUS FISTULA;  Surgeon: Mal Misty, MD;  Location: [place];  Service: Vascular;  Laterality: Left;  . REVISON OF ARTERIOVENOUS FISTULA Left 01/13/2020   Procedure: REVISON OF ARTERIOVENOUS FISTULA;  Surgeon: Marty Heck, MD;  Location: [place];  Service: Vascular;  Laterality: Left;  . TOTAL HIP ARTHROPLASTY Right 09/15/2018  . TOTAL HIP ARTHROPLASTY Right 09/15/2018   Procedure: RIGHT TOTAL  HIP ARTHROPLASTY ANTERIOR APPROACH;  Surgeon: Leandrew Koyanagi, MD;  Location: [place];  Service: Orthopedics;  Laterality: Right;  . UVULOPLASTY    . VIDEO ASSISTED THORACOSCOPY (VATS)/WEDGE RESECTION Right 08/31/2019   Procedure: VIDEO ASSISTED THORACOSCOPY/ DRAINAGE OF PERICARDIAL EFFUSION/ PERICARDIAL WINDOW/ ABORTED PERICARDIOCENTESIS ;  Surgeon: Lajuana Matte, MD;  Location: [facility] OR;  Service: Thoracic;  Laterality: Right;    Current Medications: Current Meds  Medication Sig  . acetaminophen (TYLENOL) 650 MG CR tablet Take 650 mg by mouth every 8 (eight) hours as needed for pain.  Marland Kitchen amiodarone (PACERONE) 200 MG tablet TAKE 1 TABLET BY MOUTH EVERY DAY  . bisacodyl (DULCOLAX) 5 MG EC tablet Take 5 mg by mouth daily as needed for moderate constipation.  . calcitRIOL (ROCALTROL) 0.25 MCG capsule Take 3 capsules (0.75 mcg total) by mouth every Monday, Wednesday, and Friday with hemodialysis.  Marland Kitchen colchicine 0.6 MG tablet TAKE 0.5 TABLET BY MOUTH EVERY MONDAY, WEDNESDAY, AND FRIDAY WITH HEMODIALYSIS.  Marland Kitchen dextrose 50 % solution Dextrose 50%  . diphenhydrAMINE (BENADRYL) 25 MG tablet Take 25 mg by mouth daily as needed for itching.  . docusate sodium (COLACE) 100 MG capsule Take 100 mg by mouth as needed for mild constipation.  Marland Kitchen doxylamine, Sleep, (UNISOM) 25 MG tablet Take 25 mg by mouth at bedtime as needed for sleep.  Marland Kitchen ELIQUIS 5 MG TABS tablet Take 5 mg by mouth 2 (two) times daily.   . Etelcalcetide HCl (PARSABIV IV) Inject 1 Dose as directed every Monday, Wednesday, and Friday with hemodialysis.   Marland Kitchen ethyl chloride spray Apply 1 application topically daily as needed (port access).   . folic acid-vitamin b complex-vitamin c-selenium-zinc (DIALYVITE) 3 MG TABS tablet Take 1 tablet by mouth every evening.  . gabapentin (NEURONTIN) 100 MG capsule Take 100 mg by mouth at bedtime.  . Menthol-Methyl Salicylate (MUSCLE RUB) 10-15 % CREA Apply 1 application topically as needed for muscle pain.  .  Methoxy PEG-Epoetin Beta (MIRCERA IJ) Mircera  . midodrine (PROAMATINE) 10 MG tablet Take 1 tablet (10 mg total) by mouth 3 (three) times daily with meals.  . ondansetron (ZOFRAN) 4 MG tablet Take 4 mg by mouth See admin instructions. Take 4 mg after dialysis Mon, Wed, and Fri  . oxyCODONE (ROXICODONE) 5 MG immediate release tablet Take 1 tablet (5 mg total) by mouth every 6 (six) hours as needed for severe pain.  . sucroferric oxyhydroxide (VELPHORO) 500 MG chewable tablet Chew 500-1,000 mg by mouth See admin instructions. Take 1000 mg 3 times daily with meals and 500 mg daily with a snack.     Allergies:   Iodine, Shellfish allergy, and Norvasc [amlodipine]   Social History   Socioeconomic History  . Marital status: Single    Spouse name: Not on file  . Number of children: Not on file  . Years of education:  Not on file  . Highest education level: Not on file  Occupational History  . Occupation: Dance movement psychotherapist.    Employer: [employer]  Tobacco Use  . Smoking status: Never Smoker  . Smokeless tobacco: Never Used  Substance and Sexual Activity  . Alcohol use: No    Alcohol/week: 0.0 standard drinks  . Drug use: No  . Sexual activity: Not on file  Other Topics Concern  . Not on file  Social History Narrative   Lives in [place] alone. 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   Social Determinants of Health   Financial Resource Strain:   . Difficulty of Paying Living Expenses: Not on file  Food Insecurity:   . Worried About Charity fundraiser in the Last Year: Not on file  . Ran Out of Food in the Last Year: Not on file  Transportation Needs:   . Lack of Transportation (Medical): Not on file  . Lack of Transportation (Non-Medical): Not on file  Physical Activity:   . Days of Exercise per Week: Not on file  . Minutes of Exercise per Session: Not on file  Stress:   . Feeling of Stress : Not on file  Social Connections:   . Frequency of  Communication with Friends and Family: Not on file  . Frequency of Social Gatherings with Friends and Family: Not on file  . Attends Religious Services: Not on file  . Active Member of Clubs or Organizations: Not on file  . Attends Archivist Meetings: Not on file  . Marital Status: Not on file     Family History: The patient's family history includes Diabetes in her brother; Hypertension in her mother; Kidney disease in her brother; Lung cancer in her father.  ROS:   Please see the history of present illness.    ROS  All other systems reviewed and negative.   EKGs/Labs/Other Studies Reviewed:    The following studies were reviewed today: Office notes from extender and hospital  EKG:  EKG is  ordered today and showed NSR with RBBB  Recent Labs: 08/30/2019: TSH 1.026 09/07/2019: Magnesium 2.0 09/13/2019: ALT 16 01/13/2020: BUN 32; Creatinine, Ser 8.66; Hemoglobin 10.0; Platelets 114; Potassium 4.1; Sodium 139   Recent Lipid Panel    Component Value Date/Time   CHOL 175 08/27/2019 0353   TRIG 83 08/27/2019 0353   HDL 43 08/27/2019 0353   CHOLHDL 4.1 08/27/2019 0353   VLDL 17 08/27/2019 0353   LDLCALC 115 (H) 08/27/2019 0353    Physical Exam:    VS:  BP (!) 100/54   Pulse 94   Ht 5\' 6"  (1.676 m)   Wt 215 lb 3.2 oz (97.6 kg)   SpO2 98%   BMI 34.73 kg/m     Wt Readings from Last 3 Encounters:  01/21/20 215 lb 3.2 oz (97.6 kg)  01/13/20 215 lb (97.5 kg)  01/04/20 214 lb (97.1 kg)     GEN:  Well nourished, well developed in no acute distress HEENT: Normal NECK: No JVD; No carotid bruits LYMPHATICS: No lymphadenopathy CARDIAC: RRR, no murmurs, rubs, gallops RESPIRATORY:  Clear to auscultation without rales, wheezing or rhonchi  ABDOMEN: Soft, non-tender, non-distended MUSCULOSKELETAL:  No edema; No deformity  SKIN: Warm and dry NEUROLOGIC:  Alert and oriented x 3 PSYCHIATRIC:  Normal affect   ASSESSMENT:    1. Pericarditis associated with other  disease, unspecified chronicity   2. Paroxysmal atrial fibrillation (HCC)   3. End stage  renal disease ([place])   4. DM type 2, goal HbA1c < 7% (HCC)   5. Pure hypercholesterolemia    PLAN:    In order of problems listed above:  1 . Acute pericarditis -s/p pericardial tamonade with pericardial window -Cultures NGTD, cytology with no malignant cells -CP has resolved -continue colchicine -repeat 2D echo to make sure effusion has not reaccumulated  2.  PAF -maintaining NSR on exam -denies any palpitations -continue Eliquis for stroke risk reduction -denies any bleeding problems on DOAC -labs from PCP reviewed -TSH normal at 1.026 in Sept 2020, ALT normal at 16 in Oct 2020 -given her young age and no further palpitations and resolution of pericarditis, I will stop her Amio for now and continue anticoagulation for a CHADS2VASc score of 3. -she is symptomatic when she has gotten afib after her hip surgery and again in the setting of pericarditis so I told her to call if she has any palpitations  3.  ESRD -on HD -has some problems with Hypotension which is stable on Midodrine  4.  Type 2 DM -followed by PCP -currently not on meds  5.  HLD -LDL goal < 70 -patient has refused statin therapy  Medication Adjustments/Labs and Tests Ordered: Current medicines are reviewed at length with the patient today.  Concerns regarding medicines are outlined above.  No orders of the defined types were placed in this encounter.  No orders of the defined types were placed in this encounter.   Signed, Fransico Him, MD  01/21/2020 2:26 PM    [place]

## 2020-01-21 NOTE — Addendum Note (Signed)
Addended by: Antonieta Iba on: 01/21/2020 02:48 PM   Modules accepted: Orders

## 2020-01-25 ENCOUNTER — Telehealth (HOSPITAL_COMMUNITY): Payer: Self-pay

## 2020-01-25 NOTE — Telephone Encounter (Signed)

## 2020-01-26 ENCOUNTER — Other Ambulatory Visit: Payer: Self-pay

## 2020-01-26 ENCOUNTER — Ambulatory Visit (HOSPITAL_COMMUNITY)
Admission: RE | Admit: 2020-01-26 | Discharge: 2020-01-26 | Disposition: A | Discharge status: Home / Self Care | Payer: Medicare Other | Source: Ambulatory Visit | Attending: Vascular Surgery | Admitting: Vascular Surgery

## 2020-01-26 ENCOUNTER — Ambulatory Visit (INDEPENDENT_AMBULATORY_CARE_PROVIDER_SITE_OTHER): Payer: Medicare Other | Admitting: Physician Assistant

## 2020-01-26 VITALS — BP 77/53 | HR 78 | Temp 97.2°F | Resp 14 | Ht 66.0 in | Wt 215.0 lb

## 2020-01-26 DIAGNOSIS — M79642 Pain in left hand: Secondary | ICD-10-CM | POA: Insufficient documentation

## 2020-01-26 DIAGNOSIS — M79641 Pain in right hand: Secondary | ICD-10-CM

## 2020-01-26 DIAGNOSIS — Z992 Dependence on renal dialysis: Secondary | ICD-10-CM

## 2020-01-26 DIAGNOSIS — L089 Local infection of the skin and subcutaneous tissue, unspecified: Secondary | ICD-10-CM | POA: Diagnosis not present

## 2020-01-26 DIAGNOSIS — N186 End stage renal disease: Secondary | ICD-10-CM

## 2020-01-26 NOTE — Progress Notes (Signed)
Established Dialysis Access   History of Present Illness   Brenda Arnold is a 64 y.o. (Apr 26, 1956) female who presents with chief complaint of dark, swollen, painful right middle finger.  Pain was a sudden onset 2 months ago which has progressively gotten worse.  Surgical history significant for left radiocephalic fistula placed by Dr. Oneida Alar in 2013 and revised with sidebranch ligation in 2015.  Most recently she underwent revision plication by Dr. Carlis Abbott this year and reports no problems with hemodialysis.  She has received a couple doses of antibiotics during hemodialysis treatments however does not notice any change in symptoms and appearance of her right middle finger.  She is on Eliquis for atrial fibrillation diagnosed last year around the time of her hip surgery.  Past medical history also significant for diabetes mellitus.   Current Outpatient Medications  Medication Sig Dispense Refill  . acetaminophen (TYLENOL) 650 MG CR tablet Take 650 mg by mouth every 8 (eight) hours as needed for pain.    . bisacodyl (DULCOLAX) 5 MG EC tablet Take 5 mg by mouth daily as needed for moderate constipation.    . calcitRIOL (ROCALTROL) 0.25 MCG capsule Take 3 capsules (0.75 mcg total) by mouth every Monday, Wednesday, and Friday with hemodialysis.    Marland Kitchen colchicine 0.6 MG tablet TAKE 0.5 TABLET BY MOUTH EVERY MONDAY, WEDNESDAY, AND FRIDAY WITH HEMODIALYSIS. 45 tablet 1  . dextrose 50 % solution Dextrose 50%    . diphenhydrAMINE (BENADRYL) 25 MG tablet Take 25 mg by mouth daily as needed for itching.    . docusate sodium (COLACE) 100 MG capsule Take 100 mg by mouth as needed for mild constipation.    Marland Kitchen doxylamine, Sleep, (UNISOM) 25 MG tablet Take 25 mg by mouth at bedtime as needed for sleep.    Marland Kitchen ELIQUIS 5 MG TABS tablet Take 5 mg by mouth 2 (two) times daily.     . Etelcalcetide HCl (PARSABIV IV) Inject 1 Dose as directed every Monday, Wednesday, and Friday with hemodialysis.     Marland Kitchen ethyl  chloride spray Apply 1 application topically daily as needed (port access).     . folic acid-vitamin b complex-vitamin c-selenium-zinc (DIALYVITE) 3 MG TABS tablet Take 1 tablet by mouth every evening.    . gabapentin (NEURONTIN) 100 MG capsule Take 100 mg by mouth at bedtime.  3  . Menthol-Methyl Salicylate (MUSCLE RUB) 10-15 % CREA Apply 1 application topically as needed for muscle pain.  0  . Methoxy PEG-Epoetin Beta (MIRCERA IJ) Mircera    . midodrine (PROAMATINE) 10 MG tablet Take 1 tablet (10 mg total) by mouth 3 (three) times daily with meals.    . ondansetron (ZOFRAN) 4 MG tablet Take 4 mg by mouth See admin instructions. Take 4 mg after dialysis Mon, Wed, and Fri    . oxyCODONE (ROXICODONE) 5 MG immediate release tablet Take 1 tablet (5 mg total) by mouth every 6 (six) hours as needed for severe pain. 10 tablet 0  . sucroferric oxyhydroxide (VELPHORO) 500 MG chewable tablet Chew 500-1,000 mg by mouth See admin instructions. Take 1000 mg 3 times daily with meals and 500 mg daily with a snack.     No current facility-administered medications for this visit.    On ROS today: 10 system ROS negative   Physical Examination   Vitals:   01/26/20 1418  BP: (!) 77/53  Pulse: 78  Resp: 14  Temp: (!) 97.2 F (36.2 C)  TempSrc: Temporal  SpO2: 98%  Weight: 215 lb (97.5 kg)  Height: 5\' 6"  (1.676 m)   Body mass index is 34.7 kg/m.  General Alert, O x 3, WD, NAD  Pulmonary Sym exp, good B air movt  Cardiac RRR, Nl S1, S2  Vascular Vessel Right Left  Radial Palpable Palpable  Brachial Palpable Palpable  Ulnar Faintly palpable Not palpable    Musculo- skeletal M/S 5/5 throughout  , Extremities without ischemic changes  ; Incision from plication revision well-healed; easily palpable thrill; other aneurysmal segment with hypopigmentation of skin however no scabbing or ulceration noted  Neurologic A&O; CN grossly intact     Non-invasive Vascular Imaging    Arterial duplex  demonstrates triphasic waveforms in right brachial artery, radial artery, ulnar artery, and digit PPG tracings within normal limits    Medical Decision Making   Brenda Arnold is a 64 y.o. female who presents with ESRD requiring hemodialysis.    Patent left radiocephalic fistula with well-healed plication incision  Aneurysmal segment that was not plicated has some skin changes however no scabbing or ulceration that would indicate risk of rupture; will not plicate this area at this time  Main concern is right dark swollen painful middle finger  She has triphasic waveforms in her right brachial, ulnar, and radial artery with digit tracings within normal limits  Nothing to offer from vascular surgery standpoint  Agree with continuation of antibiotics during hemodialysis  She will be referred to a Hand Surgeon for evaluation   Dagoberto Ligas PA-C [facility] Office: [phone]  Clinic MD: Oneida Alar

## 2020-01-27 DIAGNOSIS — M86241 Subacute osteomyelitis, right hand: Secondary | ICD-10-CM | POA: Insufficient documentation

## 2020-02-01 ENCOUNTER — Encounter: Payer: Medicare Other | Admitting: Vascular Surgery

## 2020-02-04 ENCOUNTER — Other Ambulatory Visit: Payer: Self-pay

## 2020-02-04 ENCOUNTER — Ambulatory Visit (HOSPITAL_COMMUNITY): Payer: Medicare Other | Attending: Internal Medicine

## 2020-02-04 DIAGNOSIS — E119 Type 2 diabetes mellitus without complications: Secondary | ICD-10-CM

## 2020-02-04 DIAGNOSIS — I319 Disease of pericardium, unspecified: Secondary | ICD-10-CM | POA: Insufficient documentation

## 2020-02-04 DIAGNOSIS — E1122 Type 2 diabetes mellitus with diabetic chronic kidney disease: Secondary | ICD-10-CM | POA: Diagnosis not present

## 2020-02-04 DIAGNOSIS — E7800 Pure hypercholesterolemia, unspecified: Secondary | ICD-10-CM | POA: Insufficient documentation

## 2020-02-04 DIAGNOSIS — N186 End stage renal disease: Secondary | ICD-10-CM | POA: Diagnosis present

## 2020-02-04 DIAGNOSIS — I480 Paroxysmal atrial fibrillation: Secondary | ICD-10-CM | POA: Insufficient documentation

## 2020-02-04 DIAGNOSIS — I120 Hypertensive chronic kidney disease with stage 5 chronic kidney disease or end stage renal disease: Secondary | ICD-10-CM | POA: Insufficient documentation

## 2020-02-04 DIAGNOSIS — E785 Hyperlipidemia, unspecified: Secondary | ICD-10-CM | POA: Diagnosis not present

## 2020-03-27 ENCOUNTER — Telehealth: Payer: Self-pay | Admitting: Orthopaedic Surgery

## 2020-03-27 NOTE — Telephone Encounter (Signed)
Patient called stating that she is needing a note from Dr. Erlinda Hong stating that it is okay for the patient to have a tooth cleaning.  She would like it faxed to the dental office.  Her appointment is at 7:30 tomorrow. Their fax number is [phone] (Dr. Tama Gander).  CB#[phone].  Thank you.

## 2020-03-28 NOTE — Telephone Encounter (Signed)
yes

## 2020-03-28 NOTE — Telephone Encounter (Signed)
Is this okay?

## 2020-03-28 NOTE — Telephone Encounter (Signed)
FAXED

## 2020-03-31 ENCOUNTER — Ambulatory Visit (INDEPENDENT_AMBULATORY_CARE_PROVIDER_SITE_OTHER): Payer: Medicare Other | Admitting: Podiatry

## 2020-03-31 ENCOUNTER — Other Ambulatory Visit: Payer: Self-pay

## 2020-03-31 ENCOUNTER — Encounter: Payer: Self-pay | Admitting: Podiatry

## 2020-03-31 VITALS — Temp 96.5°F

## 2020-03-31 DIAGNOSIS — N186 End stage renal disease: Secondary | ICD-10-CM

## 2020-03-31 DIAGNOSIS — M79675 Pain in left toe(s): Secondary | ICD-10-CM | POA: Diagnosis not present

## 2020-03-31 DIAGNOSIS — B351 Tinea unguium: Secondary | ICD-10-CM

## 2020-03-31 DIAGNOSIS — Z992 Dependence on renal dialysis: Secondary | ICD-10-CM | POA: Diagnosis not present

## 2020-03-31 DIAGNOSIS — E0822 Diabetes mellitus due to underlying condition with diabetic chronic kidney disease: Secondary | ICD-10-CM

## 2020-03-31 DIAGNOSIS — L84 Corns and callosities: Secondary | ICD-10-CM | POA: Diagnosis not present

## 2020-03-31 DIAGNOSIS — M79674 Pain in right toe(s): Secondary | ICD-10-CM | POA: Diagnosis not present

## 2020-03-31 NOTE — Patient Instructions (Signed)
Diabetes Mellitus and Foot Care Foot care is an important part of your health, especially when you have diabetes. Diabetes may cause you to have problems because of poor blood flow (circulation) to your feet and legs, which can cause your skin to:  Become thinner and drier.  Break more easily.  Heal more slowly.  Peel and crack. You may also have nerve damage (neuropathy) in your legs and feet, causing decreased feeling in them. This means that you may not notice minor injuries to your feet that could lead to more serious problems. Noticing and addressing any potential problems early is the best way to prevent future foot problems. How to care for your feet Foot hygiene  Wash your feet daily with warm water and mild soap. Do not use hot water. Then, pat your feet and the areas between your toes until they are completely dry. Do not soak your feet as this can dry your skin.  Trim your toenails straight across. Do not dig under them or around the cuticle. File the edges of your nails with an emery board or nail file.  Apply a moisturizing lotion or petroleum jelly to the skin on your feet and to dry, brittle toenails. Use lotion that does not contain alcohol and is unscented. Do not apply lotion between your toes. Shoes and socks  Wear clean socks or stockings every day. Make sure they are not too tight. Do not wear knee-high stockings since they may decrease blood flow to your legs.  Wear shoes that fit properly and have enough cushioning. Always look in your shoes before you put them on to be sure there are no objects inside.  To break in new shoes, wear them for just a few hours a day. This prevents injuries on your feet. Wounds, scrapes, corns, and calluses  Check your feet daily for blisters, cuts, bruises, sores, and redness. If you cannot see the bottom of your feet, use a mirror or ask someone for help.  Do not cut corns or calluses or try to remove them with medicine.  If you  find a minor scrape, cut, or break in the skin on your feet, keep it and the skin around it clean and dry. You may clean these areas with mild soap and water. Do not clean the area with peroxide, alcohol, or iodine.  If you have a wound, scrape, corn, or callus on your foot, look at it several times a day to make sure it is healing and not infected. Check for: ? Redness, swelling, or pain. ? Fluid or blood. ? Warmth. ? Pus or a bad smell. General instructions  Do not cross your legs. This may decrease blood flow to your feet.  Do not use heating pads or hot water bottles on your feet. They may burn your skin. If you have lost feeling in your feet or legs, you may not know this is happening until it is too late.  Protect your feet from hot and cold by wearing shoes, such as at the beach or on hot pavement.  Schedule a complete foot exam at least once a year (annually) or more often if you have foot problems. If you have foot problems, report any cuts, sores, or bruises to your health care provider immediately. Contact a health care provider if:  You have a medical condition that increases your risk of infection and you have any cuts, sores, or bruises on your feet.  You have an injury that is not   healing.  You have redness on your legs or feet.  You feel burning or tingling in your legs or feet.  You have pain or cramps in your legs and feet.  Your legs or feet are numb.  Your feet always feel cold.  You have pain around a toenail. Get help right away if:  You have a wound, scrape, corn, or callus on your foot and: ? You have pain, swelling, or redness that gets worse. ? You have fluid or blood coming from the wound, scrape, corn, or callus. ? Your wound, scrape, corn, or callus feels warm to the touch. ? You have pus or a bad smell coming from the wound, scrape, corn, or callus. ? You have a fever. ? You have a red line going up your leg. Summary  Check your feet every day  for cuts, sores, red spots, swelling, and blisters.  Moisturize feet and legs daily.  Wear shoes that fit properly and have enough cushioning.  If you have foot problems, report any cuts, sores, or bruises to your health care provider immediately.  Schedule a complete foot exam at least once a year (annually) or more often if you have foot problems. This information is not intended to replace advice given to you by your health care provider. Make sure you discuss any questions you have with your health care provider. Document Revised: 08/11/2019 Document Reviewed: 12/20/2016 Elsevier Patient Education  2020 Elsevier Inc.  Corns and Calluses Corns are small areas of thickened skin that occur on the top, sides, or tip of a toe. They contain a cone-shaped core with a point that can press on a nerve below. This causes pain.  Calluses are areas of thickened skin that can occur anywhere on the body, including the hands, fingers, palms, soles of the feet, and heels. Calluses are usually larger than corns. What are the causes? Corns and calluses are caused by rubbing (friction) or pressure, such as from shoes that are too tight or do not fit properly. What increases the risk? Corns are more likely to develop in people who have misshapen toes (toe deformities), such as hammer toes. Calluses can occur with friction to any area of the skin. They are more likely to develop in people who:  Work with their hands.  Wear shoes that fit poorly, are too tight, or are high-heeled.  Have toe deformities. What are the signs or symptoms? Symptoms of a corn or callus include:  A hard growth on the skin.  Pain or tenderness under the skin.  Redness and swelling.  Increased discomfort while wearing tight-fitting shoes, if your feet are affected. If a corn or callus becomes infected, symptoms may include:  Redness and swelling that gets worse.  Pain.  Fluid, blood, or pus draining from the corn or  callus. How is this diagnosed? Corns and calluses may be diagnosed based on your symptoms, your medical history, and a physical exam. How is this treated? Treatment for corns and calluses may include:  Removing the cause of the friction or pressure. This may involve: ? Changing your shoes. ? Wearing shoe inserts (orthotics) or other protective layers in your shoes, such as a corn pad. ? Wearing gloves.  Applying medicine to the skin (topical medicine) to help soften skin in the hardened, thickened areas.  Removing layers of dead skin with a file to reduce the size of the corn or callus.  Removing the corn or callus with a scalpel or laser.  Taking   antibiotic medicines, if your corn or callus is infected.  Having surgery, if a toe deformity is the cause. Follow these instructions at home:   Take over-the-counter and prescription medicines only as told by your health care provider.  If you were prescribed an antibiotic, take it as told by your health care provider. Do not stop taking it even if your condition starts to improve.  Wear shoes that fit well. Avoid wearing high-heeled shoes and shoes that are too tight or too loose.  Wear any padding, protective layers, gloves, or orthotics as told by your health care provider.  Soak your hands or feet and then use a file or pumice stone to soften your corn or callus. Do this as told by your health care provider.  Check your corn or callus every day for symptoms of infection. Contact a health care provider if you:  Notice that your symptoms do not improve with treatment.  Have redness or swelling that gets worse.  Notice that your corn or callus becomes painful.  Have fluid, blood, or pus coming from your corn or callus.  Have new symptoms. Summary  Corns are small areas of thickened skin that occur on the top, sides, or tip of a toe.  Calluses are areas of thickened skin that can occur anywhere on the body, including the  hands, fingers, palms, and soles of the feet. Calluses are usually larger than corns.  Corns and calluses are caused by rubbing (friction) or pressure, such as from shoes that are too tight or do not fit properly.  Treatment may include wearing any padding, protective layers, gloves, or orthotics as told by your health care provider. This information is not intended to replace advice given to you by your health care provider. Make sure you discuss any questions you have with your health care provider. Document Revised: 03/10/2019 Document Reviewed: 10/01/2017 Elsevier Patient Education  2020 Elsevier Inc.  

## 2020-04-03 NOTE — Progress Notes (Signed)
Subjective: Brenda Arnold presents today for follow up of at risk foot care with h/o NIDDM with ESRD on hemodialysis and callus(es) b/l feet and painful mycotic toenails b/l that are difficult to trim. Pain interferes with ambulation. Aggravating factors include wearing enclosed shoe gear. Pain is relieved with periodic professional debridement.   She voices no new pedal problems on today's visit.  Allergies  Allergen Reactions  . Iodine Shortness Of Breath  . Shellfish Allergy Shortness Of Breath  . Norvasc [Amlodipine] Swelling     Objective: Vitals:   03/31/20 1033  Temp: (!) 96.5 F (35.8 C)    Pt 64 y.o. year old AA female  in NAD. AAO x 3.   Vascular Examination:  Capillary refill time to digits immediate b/l. Palpable DP pulses b/l. Palpable PT pulses b/l. Pedal hair absent b/l Skin temperature gradient within normal limits b/l. No edema noted b/l.  Dermatological Examination: Pedal skin with normal turgor, texture and tone bilaterally. No open wounds bilaterally. No interdigital macerations bilaterally. Toenails 1-5 b/l elongated, dystrophic, thickened, crumbly with subungual debris and tenderness to dorsal palpation. Hyperkeratotic lesion(s) submet head 5 left foot and submet head 5 right foot.  No erythema, no edema, no drainage, no flocculence.  Musculoskeletal: Normal muscle strength 5/5 to all lower extremity muscle groups bilaterally. No pain crepitus or joint limitation noted with ROM b/l. Hammertoes noted to the 2-5 bilaterally.  Neurological: Protective sensation intact 5/5 intact bilaterally with 10g monofilament b/l. Vibratory sensation intact b/l. Proprioception intact bilaterally.  Assessment: 1. Pain due to onychomycosis of toenails of both feet   2. Callus   3. Diabetes mellitus due to underlying condition with chronic kidney disease on chronic dialysis, without long-term current use of insulin ([place])    Plan: -Continue diabetic foot care principles.  Literature dispensed on today.  -Toenails 1-5 b/l were debrided in length and girth with sterile nail nippers and dremel without iatrogenic bleeding.  -Corn(s) submet head 5 left foot and submet head 5 right foot pared utilizing sterile scalpel blade without complication or incident. Total number debrided=2. -Patient to continue soft, supportive shoe gear daily. -Patient to report any pedal injuries to medical professional immediately. -Patient/POA to call should there be question/concern in the interim.  Return in about 3 months (around 06/30/2020) for diabetic nail and callus trim/ Eliquis.  Marzetta Board, DPM

## 2020-05-16 ENCOUNTER — Other Ambulatory Visit: Payer: Self-pay | Admitting: Cardiology

## 2020-05-25 ENCOUNTER — Telehealth: Payer: Self-pay | Admitting: Cardiology

## 2020-05-25 DIAGNOSIS — I480 Paroxysmal atrial fibrillation: Secondary | ICD-10-CM

## 2020-05-25 NOTE — Telephone Encounter (Signed)
Spoke with the patient who states that she was in A Fib yesterday. When she woke up she was having palpitations, which lasted for a few hours. She felt very weak and fatigued all day. She denies any dizziness, SOB or chest pain. She states that today she does not feel like she is in A fib but is still a bit fatigued. She does not have a way to take her HR or BP but states that her BP is normally low especially during dialysis. She was taken off of amiodarone in 01/2020. She continue to take Eliquis.  Advised that I would try and find her an appointment and would also let Dr. Radford Pax know for any recommendations.

## 2020-05-25 NOTE — Telephone Encounter (Signed)
Please try to get her into afib clinic

## 2020-05-25 NOTE — Telephone Encounter (Signed)
Patient c/o Palpitations:  High priority if patient c/o lightheadedness, shortness of breath, or chest pain  1) How long have you had palpitations/irregular HR/ Afib? Are you having the symptoms now? Atrial fib- started yesterday for about 2 or 3 hrs- was so weak, could not work  2) Are you currently experiencing lightheadedness, SOB or CP? No- just weak and no energy- feels like her blood pressure might be low  3) Do you have a history of afib (atrial fibrillation) or irregular heart rhythm? Had episodes of  Atrial Fib  4) Have you checked your BP or HR? (document readings if available): do not have a blood pressure cuff  5) Are you experiencing any other symptoms?no-pt would like to be seen

## 2020-05-25 NOTE — Telephone Encounter (Signed)
Referral has been placed for patient to be seen in A Fib as soon as possible.

## 2020-05-31 ENCOUNTER — Encounter (HOSPITAL_COMMUNITY): Payer: Self-pay | Admitting: Nurse Practitioner

## 2020-05-31 ENCOUNTER — Other Ambulatory Visit: Payer: Self-pay

## 2020-05-31 ENCOUNTER — Ambulatory Visit (HOSPITAL_COMMUNITY)
Admission: RE | Admit: 2020-05-31 | Discharge: 2020-05-31 | Disposition: A | Discharge status: Home / Self Care | Payer: Medicare Other | Source: Ambulatory Visit | Attending: Nurse Practitioner | Admitting: Nurse Practitioner

## 2020-05-31 VITALS — BP 92/48 | HR 97 | Ht 66.0 in | Wt 223.0 lb

## 2020-05-31 DIAGNOSIS — Z888 Allergy status to other drugs, medicaments and biological substances status: Secondary | ICD-10-CM | POA: Insufficient documentation

## 2020-05-31 DIAGNOSIS — G4730 Sleep apnea, unspecified: Secondary | ICD-10-CM | POA: Diagnosis not present

## 2020-05-31 DIAGNOSIS — Z79899 Other long term (current) drug therapy: Secondary | ICD-10-CM | POA: Insufficient documentation

## 2020-05-31 DIAGNOSIS — Z86718 Personal history of other venous thrombosis and embolism: Secondary | ICD-10-CM | POA: Diagnosis not present

## 2020-05-31 DIAGNOSIS — E669 Obesity, unspecified: Secondary | ICD-10-CM | POA: Insufficient documentation

## 2020-05-31 DIAGNOSIS — Z992 Dependence on renal dialysis: Secondary | ICD-10-CM | POA: Insufficient documentation

## 2020-05-31 DIAGNOSIS — E1122 Type 2 diabetes mellitus with diabetic chronic kidney disease: Secondary | ICD-10-CM | POA: Diagnosis not present

## 2020-05-31 DIAGNOSIS — Z7901 Long term (current) use of anticoagulants: Secondary | ICD-10-CM | POA: Insufficient documentation

## 2020-05-31 DIAGNOSIS — Z8249 Family history of ischemic heart disease and other diseases of the circulatory system: Secondary | ICD-10-CM | POA: Diagnosis not present

## 2020-05-31 DIAGNOSIS — D6869 Other thrombophilia: Secondary | ICD-10-CM

## 2020-05-31 DIAGNOSIS — N186 End stage renal disease: Secondary | ICD-10-CM | POA: Insufficient documentation

## 2020-05-31 DIAGNOSIS — I4510 Unspecified right bundle-branch block: Secondary | ICD-10-CM | POA: Insufficient documentation

## 2020-05-31 DIAGNOSIS — I480 Paroxysmal atrial fibrillation: Secondary | ICD-10-CM | POA: Diagnosis present

## 2020-05-31 DIAGNOSIS — Z833 Family history of diabetes mellitus: Secondary | ICD-10-CM | POA: Insufficient documentation

## 2020-05-31 DIAGNOSIS — I959 Hypotension, unspecified: Secondary | ICD-10-CM | POA: Diagnosis not present

## 2020-05-31 MED ORDER — AMIODARONE HCL 200 MG PO TABS: 200.0000 mg | ORAL_TABLET | Freq: Two times a day (BID) | ORAL | 3 refills | Status: DC

## 2020-05-31 NOTE — Patient Instructions (Signed)
Restart amiodarone to 200mg  twice a day

## 2020-05-31 NOTE — Progress Notes (Signed)
Primary Care Physician: Kelton Pillar, MD Referring Physician: Dr. Gevena Cotton Brenda Arnold is a 64 y.o. female with a h/o  hypertension, now hypotension,  PAF,ESRD on HD, and obesity.  The patient was seen in the hospital in early October 2020 with symptoms consistent with acute pericarditis(pleuritc chest pain, elevated inflammatory markers, diffuse ST elevation on EKG), treated with colchicine. Initial TTE with trivial effusion. On 9/29 developed hypotension and repeat TTE showed large effusion with findings concerning for tamponade. S/p pericardial window with Dr Kipp Brood on 9/29. Culturesno growth, cytology with no malignant cells. She developed atrial fibrillation with RVR and was started on amiodarone for rhythm control and  for stroke risk reduction. She was maintaining sinus rhythm at the time of discharge.   She saw Dr. Radford Pax in February 2021 and because she had not had recurrent afib and pericarditis was resolved, she stopped amiodarone.  Pt called to the office yesterday as she had afib for many hours, was back in SR today but felt very tired. Very symptomatic in afib. She continues on eliquis 5 mg bid with a CHA2DS2VASc score of at least 6.  Today, she denies symptoms of palpitations, chest pain, shortness of breath, orthopnea, PND, lower extremity edema, dizziness, presyncope, syncope, or neurologic sequela. The patient is tolerating medications without difficulties and is otherwise without complaint today.   Past Medical History:  Diagnosis Date  . Anemia   . Arthritis   . Asthma   . Complication of anesthesia    difficulty with getting oxygen saturation up- "patient was not aware"  Bottom of feet burning in PACU  . Constipation    because of Iron  . Diabetes mellitus    not on meds  . DVT (deep venous thrombosis) ([place]) 2019   post hip replacement - right  . Dyslipidemia   . Epistaxis 11/05/2012  . ESRD (end stage renal disease) on dialysis [facility])     "MWF; [facility]" (09/15/2018)- started 02/13/2017  . History of blood transfusion   . HTN (hypertension)   . Hyperparathyroidism due to renal insufficiency ([place])   . Obesity    s/p panniculectomy  . Paroxysmal A-fib ([facility])    a. chronic coumadin;  b. 12/2009 Echo: EF 60-65%, Gr 1 DD.  Marland Kitchen PCOS (polycystic ovarian syndrome)   . Pericarditis 08/2019  . Pneumonia   . Seasonal allergies   . Sleep apnea    a. not using CPAP, last study  >8 yrs  . Vitamin D deficiency    Past Surgical History:  Procedure Laterality Date  . ABDOMINAL HYSTERECTOMY     with panniculctomy  . AV FISTULA PLACEMENT  09/02/2012   Procedure: ARTERIOVENOUS (AV) FISTULA CREATION;  Surgeon: Elam Dutch, MD;  Location: [facility] OR;  Service: Vascular;  Laterality: Left;  Creation of Left Radial-Cephalic Fistula   . BREAST SURGERY     Biopsy right breast  . COLONOSCOPY    . COLONOSCOPY W/ BIOPSIES AND POLYPECTOMY    . DILATION AND CURETTAGE OF UTERUS    . KNEE ARTHROSCOPY Right   . PANNICULECTOMY    . REVISON OF ARTERIOVENOUS FISTULA Left 04/27/2014   Procedure: REVISON OF LEFT RADIAL-CEPHALIC ARTERIOVENOUS FISTULA;  Surgeon: Mal Misty, MD;  Location: [place];  Service: Vascular;  Laterality: Left;  . REVISON OF ARTERIOVENOUS FISTULA Left 01/13/2020   Procedure: REVISON OF ARTERIOVENOUS FISTULA;  Surgeon: Marty Heck, MD;  Location: [place];  Service: Vascular;  Laterality: Left;  . TOTAL HIP  ARTHROPLASTY Right 09/15/2018  . TOTAL HIP ARTHROPLASTY Right 09/15/2018   Procedure: RIGHT TOTAL HIP ARTHROPLASTY ANTERIOR APPROACH;  Surgeon: Leandrew Koyanagi, MD;  Location: [place];  Service: Orthopedics;  Laterality: Right;  . UVULOPLASTY    . VIDEO ASSISTED THORACOSCOPY (VATS)/WEDGE RESECTION Right 08/31/2019   Procedure: VIDEO ASSISTED THORACOSCOPY/ DRAINAGE OF PERICARDIAL EFFUSION/ PERICARDIAL WINDOW/ ABORTED PERICARDIOCENTESIS ;  Surgeon: Lajuana Matte, MD;  Location: [facility] OR;  Service: Thoracic;  Laterality:  Right;    Current Outpatient Medications  Medication Sig Dispense Refill  . acetaminophen (TYLENOL) 650 MG CR tablet Take 650 mg by mouth every 8 (eight) hours as needed for pain.    . B Complex-C-Folic Acid (DIALYVITE TABLET) TABS Take 1 tablet by mouth daily.    . bisacodyl (DULCOLAX) 5 MG EC tablet Take 5 mg by mouth daily as needed for moderate constipation.    . calcitRIOL (ROCALTROL) 0.25 MCG capsule Take 3 capsules (0.75 mcg total) by mouth every Monday, Wednesday, and Friday with hemodialysis.    Marland Kitchen colchicine 0.6 MG tablet TAKE HALF TABLET BY MOUTH EVERY MONDAY, WEDNESDAY, AND FRIDAY WITH HEMODIALYSIS. 45 tablet 1  . dextrose 50 % solution Dextrose 50%    . diphenhydrAMINE (BENADRYL) 25 MG tablet Take 25 mg by mouth daily as needed for itching.    Marland Kitchen ELIQUIS 5 MG TABS tablet Take 5 mg by mouth 2 (two) times daily.     . Etelcalcetide HCl (PARSABIV IV) Inject 1 Dose as directed every Monday, Wednesday, and Friday with hemodialysis.     Marland Kitchen ethyl chloride spray Apply 1 application topically daily as needed (port access).     . folic acid-vitamin b complex-vitamin c-selenium-zinc (DIALYVITE) 3 MG TABS tablet Take 1 tablet by mouth every evening.    . gabapentin (NEURONTIN) 100 MG capsule Take 100 mg by mouth at bedtime.  3  . Menthol-Methyl Salicylate (MUSCLE RUB) 10-15 % CREA Apply 1 application topically as needed for muscle pain.  0  . Methoxy PEG-Epoetin Beta (MIRCERA IJ) Mircera    . midodrine (PROAMATINE) 10 MG tablet Take 1 tablet (10 mg total) by mouth 3 (three) times daily with meals.    . ondansetron (ZOFRAN) 4 MG tablet Take 4 mg by mouth See admin instructions. Take 4 mg after dialysis Mon, Wed, and Fri    . oxyCODONE (ROXICODONE) 5 MG immediate release tablet Take 1 tablet (5 mg total) by mouth every 6 (six) hours as needed for severe pain. (Patient taking differently: Take 5 mg by mouth as needed for severe pain. ) 10 tablet 0  . sucroferric oxyhydroxide (VELPHORO) 500 MG  chewable tablet Chew 500-1,000 mg by mouth See admin instructions. Take 1000 mg 3 times daily with meals and 500 mg daily with a snack.     No current facility-administered medications for this encounter.    Allergies  Allergen Reactions  . Iodine Shortness Of Breath  . Other Shortness Of Breath and Anaphylaxis  . Shellfish Allergy Shortness Of Breath  . Norvasc [Amlodipine] Swelling    Social History   Socioeconomic History  . Marital status: Single    Spouse name: Not on file  . Number of children: Not on file  . Years of education: Not on file  . Highest education level: Not on file  Occupational History  . Occupation: Dance movement psychotherapist.    Employer: [employer]  Tobacco Use  . Smoking status: Never Smoker  . Smokeless tobacco: Never Used  Vaping Use  .  Vaping Use: Never used  Substance and Sexual Activity  . Alcohol use: No    Alcohol/week: 0.0 standard drinks  . Drug use: No  . Sexual activity: Not on file  Other Topics Concern  . Not on file  Social History Narrative   Lives in [place] alone. 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   Social Determinants of Health   Financial Resource Strain:   . Difficulty of Paying Living Expenses:   Food Insecurity:   . Worried About Charity fundraiser in the Last Year:   . Arboriculturist in the Last Year:   Transportation Needs:   . Film/video editor (Medical):   Marland Kitchen Lack of Transportation (Non-Medical):   Physical Activity:   . Days of Exercise per Week:   . Minutes of Exercise per Session:   Stress:   . Feeling of Stress :   Social Connections:   . Frequency of Communication with Friends and Family:   . Frequency of Social Gatherings with Friends and Family:   . Attends Religious Services:   . Active Member of Clubs or Organizations:   . Attends Archivist Meetings:   Marland Kitchen Marital Status:   Intimate Partner Violence:   . Fear of Current or Ex-Partner:   .  Emotionally Abused:   Marland Kitchen Physically Abused:   . Sexually Abused:     Family History  Problem Relation Age of Onset  . Lung cancer Father        died @ 28  . Hypertension Mother   . Diabetes Brother   . Kidney disease Brother     ROS- All systems are reviewed and negative except as per the HPI above  Physical Exam: Vitals:   05/31/20 1125  BP: (!) 108/50  Pulse: 97  Weight: 101.2 kg  Height: 5\' 6"  (1.676 m)   Wt Readings from Last 3 Encounters:  05/31/20 101.2 kg  01/26/20 97.5 kg  01/21/20 97.6 kg    Labs: Lab Results  Component Value Date   NA 139 01/13/2020   K 4.1 01/13/2020   CL 97 (L) 01/13/2020   CO2 27 10/06/2019   GLUCOSE 70 01/13/2020   BUN 32 (H) 01/13/2020   CREATININE 8.66 (H) 01/13/2020   CALCIUM 8.3 (L) 10/06/2019   PHOS 3.9 09/17/2019   MG 2.0 09/07/2019   Lab Results  Component Value Date   INR 1.1 01/13/2020   Lab Results  Component Value Date   CHOL 175 08/27/2019   HDL 43 08/27/2019   LDLCALC 115 (H) 08/27/2019   TRIG 83 08/27/2019     GEN- The patient is well appearing, alert and oriented x 3 today.   Head- normocephalic, atraumatic Eyes-  Sclera clear, conjunctiva pink Ears- hearing intact Oropharynx- clear Neck- supple, no JVP Lymph- no cervical lymphadenopathy Lungs- Clear to ausculation bilaterally, normal work of breathing Heart- Regular rate and rhythm, no murmurs, rubs or gallops, PMI not laterally displaced GI- soft, NT, ND, + BS Extremities- no clubbing, cyanosis, or edema MS- no significant deformity or atrophy Skin- no rash or lesion Psych- euthymic mood, full affect Neuro- strength and sensation are intact  EKG- NSR with LAD and RBBB, inferior infarct, anterior infarct,age undetermined  Pr int 176, qrs int 124 ms, qtc 530 ms    Assessment and Plan: 1. Paroxysmal afib  Was out  of rhythm yesterday and although back in rhythm feels still tired today Had been on  amiodarone since last fall  and was stopped in  February 2021 probably now just washing out  I feel her only option with ESRD and  Dialysis/hypotension is to go back on amiodarone, rate control not an option, BP in office is 92/48. Pt is in agreement She will start reload  amio at 200 mg bid and I will see back next week   2. CHA2DS2VASc score of at least 6 Continue eliquis 5 mg bid   3. Hypotension  Continue  midodrine   4. ESRD Continue  dialysis    Geroge Baseman. Kynsie Falkner, [facility] [address] [place], [place] [address] [phone]

## 2020-06-07 ENCOUNTER — Other Ambulatory Visit: Payer: Self-pay

## 2020-06-07 ENCOUNTER — Ambulatory Visit (HOSPITAL_COMMUNITY)
Admission: RE | Admit: 2020-06-07 | Discharge: 2020-06-07 | Disposition: A | Discharge status: Home / Self Care | Payer: Medicare Other | Source: Ambulatory Visit | Attending: Nurse Practitioner | Admitting: Nurse Practitioner

## 2020-06-07 ENCOUNTER — Encounter (HOSPITAL_COMMUNITY): Payer: Self-pay | Admitting: Nurse Practitioner

## 2020-06-07 VITALS — BP 72/48 | HR 101 | Ht 66.0 in | Wt 221.2 lb

## 2020-06-07 DIAGNOSIS — E669 Obesity, unspecified: Secondary | ICD-10-CM | POA: Diagnosis not present

## 2020-06-07 DIAGNOSIS — N186 End stage renal disease: Secondary | ICD-10-CM | POA: Diagnosis not present

## 2020-06-07 DIAGNOSIS — Z992 Dependence on renal dialysis: Secondary | ICD-10-CM | POA: Insufficient documentation

## 2020-06-07 DIAGNOSIS — N2581 Secondary hyperparathyroidism of renal origin: Secondary | ICD-10-CM | POA: Insufficient documentation

## 2020-06-07 DIAGNOSIS — I480 Paroxysmal atrial fibrillation: Secondary | ICD-10-CM | POA: Insufficient documentation

## 2020-06-07 DIAGNOSIS — Z96641 Presence of right artificial hip joint: Secondary | ICD-10-CM | POA: Insufficient documentation

## 2020-06-07 DIAGNOSIS — D6869 Other thrombophilia: Secondary | ICD-10-CM

## 2020-06-07 DIAGNOSIS — J45909 Unspecified asthma, uncomplicated: Secondary | ICD-10-CM | POA: Diagnosis not present

## 2020-06-07 DIAGNOSIS — I959 Hypotension, unspecified: Secondary | ICD-10-CM | POA: Diagnosis not present

## 2020-06-07 DIAGNOSIS — D649 Anemia, unspecified: Secondary | ICD-10-CM | POA: Diagnosis not present

## 2020-06-07 DIAGNOSIS — Z79899 Other long term (current) drug therapy: Secondary | ICD-10-CM | POA: Insufficient documentation

## 2020-06-07 DIAGNOSIS — Z8679 Personal history of other diseases of the circulatory system: Secondary | ICD-10-CM | POA: Diagnosis not present

## 2020-06-07 DIAGNOSIS — E1122 Type 2 diabetes mellitus with diabetic chronic kidney disease: Secondary | ICD-10-CM | POA: Insufficient documentation

## 2020-06-07 DIAGNOSIS — K5900 Constipation, unspecified: Secondary | ICD-10-CM | POA: Insufficient documentation

## 2020-06-07 DIAGNOSIS — Z888 Allergy status to other drugs, medicaments and biological substances status: Secondary | ICD-10-CM | POA: Insufficient documentation

## 2020-06-07 DIAGNOSIS — Z86718 Personal history of other venous thrombosis and embolism: Secondary | ICD-10-CM | POA: Diagnosis not present

## 2020-06-07 DIAGNOSIS — M1990 Unspecified osteoarthritis, unspecified site: Secondary | ICD-10-CM | POA: Insufficient documentation

## 2020-06-07 DIAGNOSIS — E282 Polycystic ovarian syndrome: Secondary | ICD-10-CM | POA: Insufficient documentation

## 2020-06-07 DIAGNOSIS — Z8249 Family history of ischemic heart disease and other diseases of the circulatory system: Secondary | ICD-10-CM | POA: Insufficient documentation

## 2020-06-07 NOTE — Progress Notes (Signed)
Primary Care Physician: Kelton Pillar, MD Referring Physician: Dr. Gevena Cotton Paugh is a 64 y.o. female with a h/o  hypertension, now hypotension,  PAF,ESRD on HD, and obesity.  The patient was seen in the hospital in early October 2020 with symptoms consistent with acute pericarditis(pleuritc chest pain, elevated inflammatory markers, diffuse ST elevation on EKG), treated with colchicine. Initial TTE with trivial effusion. On 9/29 developed hypotension and repeat TTE showed large effusion with findings concerning for tamponade. S/p pericardial window with Dr Kipp Brood on 9/29. Culturesno growth, cytology with no malignant cells. She developed atrial fibrillation with RVR and was started on amiodarone for rhythm control and  for stroke risk reduction. She was maintaining sinus rhythm at the time of discharge.   She saw Dr. Radford Pax in February 2021 and because she had not had recurrent afib and pericarditis was resolved, she stopped amiodarone.  Pt called to the office yesterday as she had afib for many hours, was back in SR today but felt very tired. Very symptomatic in afib. She continues on eliquis 5 mg bid with a CHA2DS2VASc score of at least 6.  F/u in afib clinic, 06/07/20. She has been back on amiodarone  x one week. I meant for her to take bid for one week but she misunderstood and has been taking one a day.  Ekg today shows  SR with RBBB.  She does not report      any  afib. She just finished dialysis and BP is low at 72/48 which is often pt's norm after dialysis.   Today, she denies symptoms of palpitations, chest pain, shortness of breath, orthopnea, PND, lower extremity edema, dizziness, presyncope, syncope, or neurologic sequela. The patient is tolerating medications without difficulties and is otherwise without complaint today.   Past Medical History:  Diagnosis Date  . Anemia   . Arthritis   . Asthma   . Complication of anesthesia    difficulty with  getting oxygen saturation up- "patient was not aware"  Bottom of feet burning in PACU  . Constipation    because of Iron  . Diabetes mellitus    not on meds  . DVT (deep venous thrombosis) ([place]) 2019   post hip replacement - right  . Dyslipidemia   . Epistaxis 11/05/2012  . ESRD (end stage renal disease) on dialysis [facility])    "MWF; [facility]" (09/15/2018)- started 02/13/2017  . History of blood transfusion   . HTN (hypertension)   . Hyperparathyroidism due to renal insufficiency ([place])   . Obesity    s/p panniculectomy  . Paroxysmal A-fib ([facility])    a. chronic coumadin;  b. 12/2009 Echo: EF 60-65%, Gr 1 DD.  Marland Kitchen PCOS (polycystic ovarian syndrome)   . Pericarditis 08/2019  . Pneumonia   . Seasonal allergies   . Sleep apnea    a. not using CPAP, last study  >8 yrs  . Vitamin D deficiency    Past Surgical History:  Procedure Laterality Date  . ABDOMINAL HYSTERECTOMY     with panniculctomy  . AV FISTULA PLACEMENT  09/02/2012   Procedure: ARTERIOVENOUS (AV) FISTULA CREATION;  Surgeon: Elam Dutch, MD;  Location: [facility] OR;  Service: Vascular;  Laterality: Left;  Creation of Left Radial-Cephalic Fistula   . BREAST SURGERY     Biopsy right breast  . COLONOSCOPY    . COLONOSCOPY W/ BIOPSIES AND POLYPECTOMY    . DILATION AND CURETTAGE OF UTERUS    . KNEE ARTHROSCOPY  Right   . PANNICULECTOMY    . REVISON OF ARTERIOVENOUS FISTULA Left 04/27/2014   Procedure: REVISON OF LEFT RADIAL-CEPHALIC ARTERIOVENOUS FISTULA;  Surgeon: Mal Misty, MD;  Location: [place];  Service: Vascular;  Laterality: Left;  . REVISON OF ARTERIOVENOUS FISTULA Left 01/13/2020   Procedure: REVISON OF ARTERIOVENOUS FISTULA;  Surgeon: Marty Heck, MD;  Location: [place];  Service: Vascular;  Laterality: Left;  . TOTAL HIP ARTHROPLASTY Right 09/15/2018  . TOTAL HIP ARTHROPLASTY Right 09/15/2018   Procedure: RIGHT TOTAL HIP ARTHROPLASTY ANTERIOR APPROACH;  Surgeon: Leandrew Koyanagi, MD;  Location: [facility];  Service:  Orthopedics;  Laterality: Right;  . UVULOPLASTY    . VIDEO ASSISTED THORACOSCOPY (VATS)/WEDGE RESECTION Right 08/31/2019   Procedure: VIDEO ASSISTED THORACOSCOPY/ DRAINAGE OF PERICARDIAL EFFUSION/ PERICARDIAL WINDOW/ ABORTED PERICARDIOCENTESIS ;  Surgeon: Lajuana Matte, MD;  Location: [facility] OR;  Service: Thoracic;  Laterality: Right;    Current Outpatient Medications  Medication Sig Dispense Refill  . acetaminophen (TYLENOL) 650 MG CR tablet Take 650 mg by mouth every 8 (eight) hours as needed for pain.    Marland Kitchen amiodarone (PACERONE) 200 MG tablet Take 1 tablet (200 mg total) by mouth 2 (two) times daily. 90 tablet 3  . B Complex-C-Folic Acid (DIALYVITE TABLET) TABS Take 1 tablet by mouth daily.    . bisacodyl (DULCOLAX) 5 MG EC tablet Take 5 mg by mouth daily as needed for moderate constipation.    . calcitRIOL (ROCALTROL) 0.25 MCG capsule Take 3 capsules (0.75 mcg total) by mouth every Monday, Wednesday, and Friday with hemodialysis.    Marland Kitchen colchicine 0.6 MG tablet TAKE HALF TABLET BY MOUTH EVERY MONDAY, WEDNESDAY, AND FRIDAY WITH HEMODIALYSIS. 45 tablet 1  . dextrose 50 % solution Dextrose 50%    . diphenhydrAMINE (BENADRYL) 25 MG tablet Take 25 mg by mouth daily as needed for itching.    Marland Kitchen ELIQUIS 5 MG TABS tablet Take 5 mg by mouth 2 (two) times daily.     . Etelcalcetide HCl (PARSABIV IV) Inject 1 Dose as directed every Monday, Wednesday, and Friday with hemodialysis.     Marland Kitchen ethyl chloride spray Apply 1 application topically daily as needed (port access).     . folic acid-vitamin b complex-vitamin c-selenium-zinc (DIALYVITE) 3 MG TABS tablet Take 1 tablet by mouth every evening.    . gabapentin (NEURONTIN) 100 MG capsule Take 100 mg by mouth at bedtime.  3  . Menthol-Methyl Salicylate (MUSCLE RUB) 10-15 % CREA Apply 1 application topically as needed for muscle pain.  0  . Methoxy PEG-Epoetin Beta (MIRCERA IJ) Mircera    . midodrine (PROAMATINE) 10 MG tablet Take 1 tablet (10 mg total) by  mouth 3 (three) times daily with meals.    . ondansetron (ZOFRAN) 4 MG tablet Take 4 mg by mouth See admin instructions. Take 4 mg after dialysis Mon, Wed, and Fri    . oxyCODONE (ROXICODONE) 5 MG immediate release tablet Take 1 tablet (5 mg total) by mouth every 6 (six) hours as needed for severe pain. (Patient taking differently: Take 5 mg by mouth as needed for severe pain. ) 10 tablet 0  . sucroferric oxyhydroxide (VELPHORO) 500 MG chewable tablet Chew 500-1,000 mg by mouth See admin instructions. Take 1000 mg 3 times daily with meals and 500 mg daily with a snack.     No current facility-administered medications for this encounter.    Allergies  Allergen Reactions  . Iodine Shortness Of Breath  . Other Shortness  Of Breath and Anaphylaxis  . Shellfish Allergy Shortness Of Breath  . Norvasc [Amlodipine] Swelling    Social History   Socioeconomic History  . Marital status: Single    Spouse name: Not on file  . Number of children: Not on file  . Years of education: Not on file  . Highest education level: Not on file  Occupational History  . Occupation: Dance movement psychotherapist.    Employer: [employer]  Tobacco Use  . Smoking status: Never Smoker  . Smokeless tobacco: Never Used  Vaping Use  . Vaping Use: Never used  Substance and Sexual Activity  . Alcohol use: No    Alcohol/week: 0.0 standard drinks  . Drug use: No  . Sexual activity: Not on file  Other Topics Concern  . Not on file  Social History Narrative   Lives in [place] alone. 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   Social Determinants of Health   Financial Resource Strain:   . Difficulty of Paying Living Expenses:   Food Insecurity:   . Worried About Charity fundraiser in the Last Year:   . Arboriculturist in the Last Year:   Transportation Needs:   . Film/video editor (Medical):   Marland Kitchen Lack of Transportation (Non-Medical):   Physical Activity:   . Days of Exercise per  Week:   . Minutes of Exercise per Session:   Stress:   . Feeling of Stress :   Social Connections:   . Frequency of Communication with Friends and Family:   . Frequency of Social Gatherings with Friends and Family:   . Attends Religious Services:   . Active Member of Clubs or Organizations:   . Attends Archivist Meetings:   Marland Kitchen Marital Status:   Intimate Partner Violence:   . Fear of Current or Ex-Partner:   . Emotionally Abused:   Marland Kitchen Physically Abused:   . Sexually Abused:     Family History  Problem Relation Age of Onset  . Lung cancer Father        died @ 95  . Hypertension Mother   . Diabetes Brother   . Kidney disease Brother     ROS- All systems are reviewed and negative except as per the HPI above  Physical Exam: There were no vitals filed for this visit. Wt Readings from Last 3 Encounters:  05/31/20 101.2 kg  01/26/20 97.5 kg  01/21/20 97.6 kg    Labs: Lab Results  Component Value Date   NA 139 01/13/2020   K 4.1 01/13/2020   CL 97 (L) 01/13/2020   CO2 27 10/06/2019   GLUCOSE 70 01/13/2020   BUN 32 (H) 01/13/2020   CREATININE 8.66 (H) 01/13/2020   CALCIUM 8.3 (L) 10/06/2019   PHOS 3.9 09/17/2019   MG 2.0 09/07/2019   Lab Results  Component Value Date   INR 1.1 01/13/2020   Lab Results  Component Value Date   CHOL 175 08/27/2019   HDL 43 08/27/2019   LDLCALC 115 (H) 08/27/2019   TRIG 83 08/27/2019     GEN- The patient is well appearing, alert and oriented x 3 today.   Head- normocephalic, atraumatic Eyes-  Sclera clear, conjunctiva pink Ears- hearing intact Oropharynx- clear Neck- supple, no JVP Lymph- no cervical lymphadenopathy Lungs- Clear to ausculation bilaterally, normal work of breathing Heart- Regular rate and rhythm, no murmurs, rubs or gallops, PMI not laterally displaced GI- soft, NT, ND, + BS Extremities- no clubbing,  cyanosis, or edema MS- no significant deformity or atrophy Skin- no rash or lesion Psych-  euthymic mood, full affect Neuro- strength and sensation are intact  EKG- NSR  at 101 bpm, with RBBB pr int 172 ms, qrs int 128 ms, qtc 544 ms    Assessment and Plan: 1. Paroxysmal afib  Was out  of rhythm last week  and  Pt was very symptoatic  Had been on  amiodarone since last fall and was stopped in February 2021 probably now just washing out  I felt  her only option with ESRD and  Dialysis/hypotension is to go back on amiodarone, rate control not an option due to frequently low BP's  She has now been back on at 200 mg daily x one week No further afib reported    2. CHA2DS2VASc score of at least 6 Continue eliquis 5 mg bid   3. Hypotension  Continue  midodrine   4. ESRD Continue  dialysis   F/u with Dr. Radford Pax as scheduled 8/18  afib clinic as needed   Geroge Baseman. Brian Zeitlin, [facility] [address] [place], [place] [address] [phone]

## 2020-06-23 ENCOUNTER — Other Ambulatory Visit: Payer: Self-pay | Admitting: Cardiology

## 2020-06-30 ENCOUNTER — Other Ambulatory Visit: Payer: Self-pay

## 2020-06-30 ENCOUNTER — Ambulatory Visit (INDEPENDENT_AMBULATORY_CARE_PROVIDER_SITE_OTHER): Payer: Medicare Other | Admitting: Podiatry

## 2020-06-30 ENCOUNTER — Encounter: Payer: Self-pay | Admitting: Podiatry

## 2020-06-30 DIAGNOSIS — L84 Corns and callosities: Secondary | ICD-10-CM | POA: Diagnosis not present

## 2020-06-30 DIAGNOSIS — M79675 Pain in left toe(s): Secondary | ICD-10-CM

## 2020-06-30 DIAGNOSIS — M79674 Pain in right toe(s): Secondary | ICD-10-CM | POA: Diagnosis not present

## 2020-06-30 DIAGNOSIS — Z992 Dependence on renal dialysis: Secondary | ICD-10-CM | POA: Diagnosis not present

## 2020-06-30 DIAGNOSIS — E0822 Diabetes mellitus due to underlying condition with diabetic chronic kidney disease: Secondary | ICD-10-CM

## 2020-06-30 DIAGNOSIS — N186 End stage renal disease: Secondary | ICD-10-CM

## 2020-06-30 DIAGNOSIS — B351 Tinea unguium: Secondary | ICD-10-CM | POA: Diagnosis not present

## 2020-07-01 NOTE — Progress Notes (Signed)
Subjective: Brenda Arnold presents today for follow up of at risk foot care with h/o NIDDM with ESRD on hemodialysis and callus(es) b/l feet and painful mycotic toenails b/l that are difficult to trim. Pain interferes with ambulation. Aggravating factors include wearing enclosed shoe gear. Pain is relieved with periodic professional debridement.    She states she had dialysis on this morning.  She also continues to work a full-time job from home.  She voices no new pedal problems on today's visit.  Allergies  Allergen Reactions  . Iodine Shortness Of Breath  . Other Shortness Of Breath and Anaphylaxis  . Shellfish Allergy Shortness Of Breath  . Norvasc [Amlodipine] Swelling     Objective: There were no vitals filed for this visit.  Pt is a pleasant 64 y.o. African-American female in NAD. AAO x 3.   Vascular Examination:  Capillary refill time to digits immediate b/l. Palpable DP pulses b/l. Palpable PT pulses b/l. Pedal hair absent b/l Skin temperature gradient within normal limits b/l. No edema noted b/l.  Dermatological Examination: Pedal skin with normal turgor, texture and tone bilaterally. No open wounds bilaterally. No interdigital macerations bilaterally. Toenails 1-5 b/l elongated, dystrophic, thickened, crumbly with subungual debris and tenderness to dorsal palpation. Hyperkeratotic lesion(s) submet head 5 left foot and submet head 5 right foot.  No erythema, no edema, no drainage, no flocculence.  Musculoskeletal: Normal muscle strength 5/5 to all lower extremity muscle groups bilaterally. No pain crepitus or joint limitation noted with ROM b/l. Hammertoes noted to the 2-5 bilaterally. Utilizes walker for ambulation assistance.  Neurological: Protective sensation intact 5/5 intact bilaterally with 10g monofilament b/l. Vibratory sensation intact b/l. Proprioception intact bilaterally.  Assessment: 1. Pain due to onychomycosis of toenails of both feet   2. Callus   3.  Diabetes mellitus due to underlying condition with chronic kidney disease on chronic dialysis, without long-term current use of insulin ([place])     Plan: -Examined patient. -No new findings. No new orders. -Continue diabetic foot care principles. -Toenails 1-5 b/l were debrided in length and girth with sterile nail nippers and dremel without iatrogenic bleeding.  -Corn(s) submet head 5 left foot and submet head 5 right foot pared utilizing sterile scalpel blade without complication or incident. Total number debrided=2. -Patient to report any pedal injuries to medical professional immediately. -Patient to continue soft, supportive shoe gear daily. -Patient/POA to call should there be question/concern in the interim.  Return in about 3 months (around 09/30/2020) for diabetic nail and callus trim.  Marzetta Board, DPM

## 2020-07-11 DIAGNOSIS — R519 Headache, unspecified: Secondary | ICD-10-CM | POA: Insufficient documentation

## 2020-08-17 ENCOUNTER — Encounter: Payer: Self-pay | Admitting: Cardiology

## 2020-08-17 ENCOUNTER — Ambulatory Visit (INDEPENDENT_AMBULATORY_CARE_PROVIDER_SITE_OTHER): Payer: Medicare Other | Admitting: Cardiology

## 2020-08-17 ENCOUNTER — Other Ambulatory Visit: Payer: Self-pay

## 2020-08-17 VITALS — BP 96/50 | HR 85 | Ht 66.5 in | Wt 228.6 lb

## 2020-08-17 DIAGNOSIS — N186 End stage renal disease: Secondary | ICD-10-CM

## 2020-08-17 DIAGNOSIS — I319 Disease of pericardium, unspecified: Secondary | ICD-10-CM

## 2020-08-17 DIAGNOSIS — E1169 Type 2 diabetes mellitus with other specified complication: Secondary | ICD-10-CM

## 2020-08-17 DIAGNOSIS — E7800 Pure hypercholesterolemia, unspecified: Secondary | ICD-10-CM

## 2020-08-17 DIAGNOSIS — I480 Paroxysmal atrial fibrillation: Secondary | ICD-10-CM | POA: Diagnosis not present

## 2020-08-17 MED ORDER — AMIODARONE HCL 200 MG PO TABS: 200.0000 mg | ORAL_TABLET | Freq: Two times a day (BID) | ORAL | 3 refills | Status: DC

## 2020-08-17 NOTE — Patient Instructions (Signed)
Medication Instructions:  Your physician recommends that you continue on your current medications as directed. Please refer to the Current Medication list given to you today.  *If you need a refill on your cardiac medications before your next appointment, please call your pharmacy*  Lab Work: TSH and ALT in 3 months If you have labs (blood work) drawn today and your tests are completely normal, you will receive your results only by: Marland Kitchen MyChart Message (if you have MyChart) OR . A paper copy in the mail If you have any lab test that is abnormal or we need to change your treatment, we will call you to review the results.   Testing/Procedures: Your physician has recommended that you have a pulmonary function test in 3 months. Pulmonary Function Tests are a group of tests that measure how well air moves in and out of your lungs.  Follow-Up: At [facility], you and your health needs are our priority.  As part of our continuing mission to provide you with exceptional heart care, we have created designated Provider Care Teams.  These Care Teams include your primary Cardiologist (physician) and Advanced Practice Providers (APPs -  Physician Assistants and Nurse Practitioners) who all work together to provide you with the care you need, when you need it.  Your next appointment:   6 month(s)  The format for your next appointment:   In Person  Provider:   You may see Fransico Him, MD or one of the following Advanced Practice Providers on your designated Care Team:    Melina Copa, PA-C  Ermalinda Barrios, PA-C

## 2020-08-17 NOTE — Addendum Note (Signed)
Addended by: Antonieta Iba on: 08/17/2020 03:57 PM   Modules accepted: Orders

## 2020-08-17 NOTE — Progress Notes (Addendum)
Cardiology Office Note:    Date:  08/17/2020   ID:  Brenda Arnold, DOB [birth year], MRN [id number]  PCP:  Kelton Pillar, MD  Cardiologist:  Fransico Him, MD    Referring MD: Kelton Pillar, MD   Chief Complaint  Patient presents with  . Atrial Fibrillation  . Hyperlipidemia    History of Present Illness:    Brenda Arnold is a 64 y.o. female with a hx of hypertension, PAF,ESRD on HD, and obesity.She had a Myoview secondary to chest pain in the setting of atrial fibrillation 2011 that showed inferolateral lateral ischemia. Heart catheterization was deferred and medical therapy was recommended.  She underwent Myoview stress test on 5/17/18which showed no reversible ischemia.    The patient was seen in the hospital in early October 2020 with symptoms consistent with acute pericarditis (pleuritc chest pain, elevated inflammatory markers, diffuse ST elevation on EKG), treated with colchicine. Initial TTE with trivial effusion. On 9/29 developed hypotension and repeat TTE showed large effusion with findings concerning for tamponade. S/p pericardial window with Dr Kipp Brood on 9/29. Cultures no growth, cytology with no malignant cells. Continued colchicine with HD.  She developed atrial fibrillation with RVR and was started on amiodarone for rhythm control and risks for stroke risk reduction.  She was maintaining sinus rhythm at the time of discharge.  She went to inpatient rehab.  She went to the ED on 10/06/2019 with complaints of pleuritic chest pain.  There were no ischemic changes on EKG and troponins were normal.  She was not felt to be volume overloaded and potassium was normal.  Seen back in the office by extender and Bp was stable on Midodrine and CP with breathing had improved but still had some pleuritic CP with deep inspiration.  She was continued on Colchicine.  She was maintaining NSR on Amio and midodrine for hypotension on HD.   I saw her 01/2020 and her Amio  was stopped as she was maintaining NSR.  Repeat 2D echo 01/2020 showed normal LVF and no pericardia effusion.  She was seen in afib clinic 05/2020 due to palpitations and recurrent afib but was back in NSR during the OV.  Unfortunately it was felt that Amio was only option given her ESRD and she was restarted on Amio 200mg  BID.  She was seen back in July and was not having any more palpitations. She ran out of her [place] and has been out of it for 3 months.   She is here today for followup and is doing well.  She denies any chest pain or pressure, SOB, DOE, PND, orthopnea, LE edema, dizziness (except is her BP drops in HD), palpitations or syncope. She is compliant with her meds and is tolerating meds with no SE.    Past Medical History:  Diagnosis Date  . Anemia   . Arthritis   . Asthma   . Complication of anesthesia    difficulty with getting oxygen saturation up- "patient was not aware"  Bottom of feet burning in PACU  . Constipation    because of Iron  . Diabetes mellitus    not on meds  . DVT (deep venous thrombosis) ([place]) 2019   post hip replacement - right  . Dyslipidemia   . Epistaxis 11/05/2012  . ESRD (end stage renal disease) on dialysis [facility])    "MWF; [facility]" (09/15/2018)- started 02/13/2017  . History of blood transfusion   . HTN (hypertension)   . Hyperparathyroidism due to renal insufficiency ([place])   .  Obesity    s/p panniculectomy  . Paroxysmal A-fib ([facility])    a. chronic coumadin;  b. 12/2009 Echo: EF 60-65%, Gr 1 DD.  Marland Kitchen PCOS (polycystic ovarian syndrome)   . Pericarditis 08/2019  . Pneumonia   . Seasonal allergies   . Sleep apnea    a. not using CPAP, last study  >8 yrs  . Vitamin D deficiency     Past Surgical History:  Procedure Laterality Date  . ABDOMINAL HYSTERECTOMY     with panniculctomy  . AV FISTULA PLACEMENT  09/02/2012   Procedure: ARTERIOVENOUS (AV) FISTULA CREATION;  Surgeon: Elam Dutch, MD;  Location[facility] OR;  Service: Vascular;  Laterality:  Left;  Creation of Left Radial-Cephalic Fistula   . BREAST SURGERY     Biopsy right breast  . COLONOSCOPY    . COLONOSCOPY W/ BIOPSIES AND POLYPECTOMY    . DILATION AND CURETTAGE OF UTERUS    . KNEE ARTHROSCOPY Right   . PANNICULECTOMY    . REVISON OF ARTERIOVENOUS FISTULA Left 04/27/2014   Procedure: REVISON OF LEFT RADIAL-CEPHALIC ARTERIOVENOUS FISTULA;  Surgeon: Mal Misty, MD;  Location: [facility];  Service: Vascular;  Laterality: Left;  . REVISON OF ARTERIOVENOUS FISTULA Left 01/13/2020   Procedure: REVISON OF ARTERIOVENOUS FISTULA;  Surgeon: Marty Heck, MD;  Location: [facility];  Service: Vascular;  Laterality: Left;  . TOTAL HIP ARTHROPLASTY Right 09/15/2018  . TOTAL HIP ARTHROPLASTY Right 09/15/2018   Procedure: RIGHT TOTAL HIP ARTHROPLASTY ANTERIOR APPROACH;  Surgeon: Leandrew Koyanagi, MD;  Location: [facility];  Service: Orthopedics;  Laterality: Right;  . UVULOPLASTY    . VIDEO ASSISTED THORACOSCOPY (VATS)/WEDGE RESECTION Right 08/31/2019   Procedure: VIDEO ASSISTED THORACOSCOPY/ DRAINAGE OF PERICARDIAL EFFUSION/ PERICARDIAL WINDOW/ ABORTED PERICARDIOCENTESIS ;  Surgeon: Lajuana Matte, MD;  Location: [facility] OR;  Service: Thoracic;  Laterality: Right;    Current Medications: Current Meds  Medication Sig  . acetaminophen (TYLENOL) 650 MG CR tablet Take 650 mg by mouth every 8 (eight) hours as needed for pain.  . B Complex-C-Folic Acid (DIALYVITE TABLET) TABS Take 1 tablet by mouth daily.  . bisacodyl (DULCOLAX) 5 MG EC tablet Take 5 mg by mouth daily as needed for moderate constipation.  . calcitRIOL (ROCALTROL) 0.25 MCG capsule Take 3 capsules (0.75 mcg total) by mouth every Monday, Wednesday, and Friday with hemodialysis.  Marland Kitchen colchicine 0.6 MG tablet TAKE HALF TABLET BY MOUTH EVERY MONDAY, WEDNESDAY, AND FRIDAY WITH HEMODIALYSIS.  Marland Kitchen dextrose 50 % solution Dextrose 50%  . diphenhydrAMINE (BENADRYL) 25 MG tablet Take 25 mg by mouth daily as needed for itching.  Marland Kitchen ELIQUIS 5 MG TABS  tablet Take 5 mg by mouth 2 (two) times daily.   Marland Kitchen ethyl chloride spray Apply 1 application topically daily as needed (port access).   . folic acid-vitamin b complex-vitamin c-selenium-zinc (DIALYVITE) 3 MG TABS tablet Take 1 tablet by mouth every evening.  . gabapentin (NEURONTIN) 100 MG capsule Take 100 mg by mouth at bedtime.  . Menthol-Methyl Salicylate (MUSCLE RUB) 10-15 % CREA Apply 1 application topically as needed for muscle pain.  . Methoxy PEG-Epoetin Beta (MIRCERA IJ) Mircera  . midodrine (PROAMATINE) 10 MG tablet Take 1 tablet (10 mg total) by mouth 3 (three) times daily with meals.  . ondansetron (ZOFRAN) 4 MG tablet Take 4 mg by mouth See admin instructions. Take 4 mg after dialysis Mon, Wed, and Fri  . oxyCODONE (ROXICODONE) 5 MG immediate release tablet Take 1 tablet (5 mg total)  by mouth every 6 (six) hours as needed for severe pain.  . sucroferric oxyhydroxide (VELPHORO) 500 MG chewable tablet Chew 500-1,000 mg by mouth See admin instructions. Take 1000 mg 3 times daily with meals and 500 mg daily with a snack.     Allergies:   Iodine, Other, Shellfish allergy, and Norvasc [amlodipine]   Social History   Socioeconomic History  . Marital status: Single    Spouse name: Not on file  . Number of children: Not on file  . Years of education: Not on file  . Highest education level: Not on file  Occupational History  . Occupation: Dance movement psychotherapist.    Employer: [employer]  Tobacco Use  . Smoking status: Never Smoker  . Smokeless tobacco: Never Used  Vaping Use  . Vaping Use: Never used  Substance and Sexual Activity  . Alcohol use: No    Alcohol/week: 0.0 standard drinks  . Drug use: No  . Sexual activity: Not on file  Other Topics Concern  . Not on file  Social History Narrative   Lives in [place] alone. 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   Social Determinants of Health   Financial Resource Strain:   . Difficulty of Paying  Living Expenses: Not on file  Food Insecurity:   . Worried About Charity fundraiser in the Last Year: Not on file  . Ran Out of Food in the Last Year: Not on file  Transportation Needs:   . Lack of Transportation (Medical): Not on file  . Lack of Transportation (Non-Medical): Not on file  Physical Activity:   . Days of Exercise per Week: Not on file  . Minutes of Exercise per Session: Not on file  Stress:   . Feeling of Stress : Not on file  Social Connections:   . Frequency of Communication with Friends and Family: Not on file  . Frequency of Social Gatherings with Friends and Family: Not on file  . Attends Religious Services: Not on file  . Active Member of Clubs or Organizations: Not on file  . Attends Archivist Meetings: Not on file  . Marital Status: Not on file     Family History: The patient's family history includes Diabetes in her brother; Hypertension in her mother; Kidney disease in her brother; Lung cancer in her father.  ROS:   Please see the history of present illness.    ROS  All other systems reviewed and negative.   EKGs/Labs/Other Studies Reviewed:    The following studies were reviewed today: 2D echo 01/2020 IMPRESSIONS   1. Left ventricular ejection fraction, by estimation, is 70 to 75%. The  left ventricle has hyperdynamic function. Left ventricular diastolic  parameters are indeterminate.  2. Right ventricular systolic function is normal. The right ventricular  size is normal. There is normal pulmonary artery systolic pressure.  3. The mitral valve is normal in structure and function. Trivial mitral  valve regurgitation.  4. The aortic valve is abnormal. Mild to moderate aortic valve  sclerosis/calcification is present, without any evidence of aortic  stenosis.  5. The inferior vena cava is normal in size with greater than 50%  respiratory variability, suggesting right atrial pressure of 3 mmHg  EKG:  EKG is  ordered today and  showed NSR with RBBB inferior and anterior infarcts unchanged from prior EKG  Recent Labs: 08/30/2019: TSH 1.026 09/07/2019: Magnesium 2.0 09/13/2019: ALT 16 01/13/2020: BUN 32; Creatinine, Ser 8.66; Hemoglobin 10.0; Platelets 114;  Potassium 4.1; Sodium 139   Recent Lipid Panel    Component Value Date/Time   CHOL 175 08/27/2019 0353   TRIG 83 08/27/2019 0353   HDL 43 08/27/2019 0353   CHOLHDL 4.1 08/27/2019 0353   VLDL 17 08/27/2019 0353   LDLCALC 115 (H) 08/27/2019 0353    Physical Exam:    VS:  BP (!) 96/50   Pulse 85   Ht 5' 6.5" (1.689 m)   Wt 228 lb 9.6 oz (103.7 kg)   SpO2 98%   BMI 36.34 kg/m     Wt Readings from Last 3 Encounters:  08/17/20 228 lb 9.6 oz (103.7 kg)  06/07/20 221 lb 3.2 oz (100.3 kg)  05/31/20 223 lb (101.2 kg)      GEN: Well nourished, well developed in no acute distress HEENT: Normal NECK: No JVD; No carotid bruits LYMPHATICS: No lymphadenopathy CARDIAC:RRR, no murmurs, rubs, gallops RESPIRATORY:  Clear to auscultation without rales, wheezing or rhonchi  ABDOMEN: Soft, non-tender, non-distended MUSCULOSKELETAL:  No edema; No deformity  SKIN: Warm and dry NEUROLOGIC:  Alert and oriented x 3 PSYCHIATRIC:  Normal affect    ASSESSMENT:    1. Pericarditis, unspecified chronicity, unspecified type   2. PAF (paroxysmal atrial fibrillation) ([place])   3. End stage renal disease ([place])   4. Type 2 diabetes mellitus with other specified complication, without long-term current use of insulin ([place])   5. Pure hypercholesterolemia    PLAN:    In order of problems listed above:  1 . Acute pericarditis -s/p pericardial tamonade with pericardial window -Cultures NGTD, cytology with no malignant cells -she has not had any further CP -continue colchicine -repeat 2D 01/2020 with no pericardial effusion  2.  PAF -she had recurrent PAF and started back on Amio 200mg  daily as it was felt to be only option given that she has ESRD but ran out of it about  3 months ago -she has not had any further palpitations -continue Eliquis 5mg  BID for stroke risk reduction -denies any bleeding problems on DOAC -SCr was 8.66 on 01/2020  -refill Amio 200mg  daily -check TSH and ALT and PFTs with DLCO in 3 months  3.  ESRD -on HD -has some problems with Hypotension at HD which is stable on Midodrine  4.  Type 2 DM -followed by PCP  5.  HLD -LDL goal < 70 -patient has refused statin therapy  Medication Adjustments/Labs and Tests Ordered: Current medicines are reviewed at length with the patient today.  Concerns regarding medicines are outlined above.  No orders of the defined types were placed in this encounter.  No orders of the defined types were placed in this encounter.   Signed, Fransico Him, MD  08/17/2020 3:32 PM    [place]

## 2020-08-18 IMAGING — DX DG CHEST 1V PORT
1 series · 1 of 1 positions shown · non-contrast
Comparison: 08/25/2019

CLINICAL DATA: Evaluate for pneumothorax.

EXAM:
PORTABLE CHEST 1 VIEW

[chest]
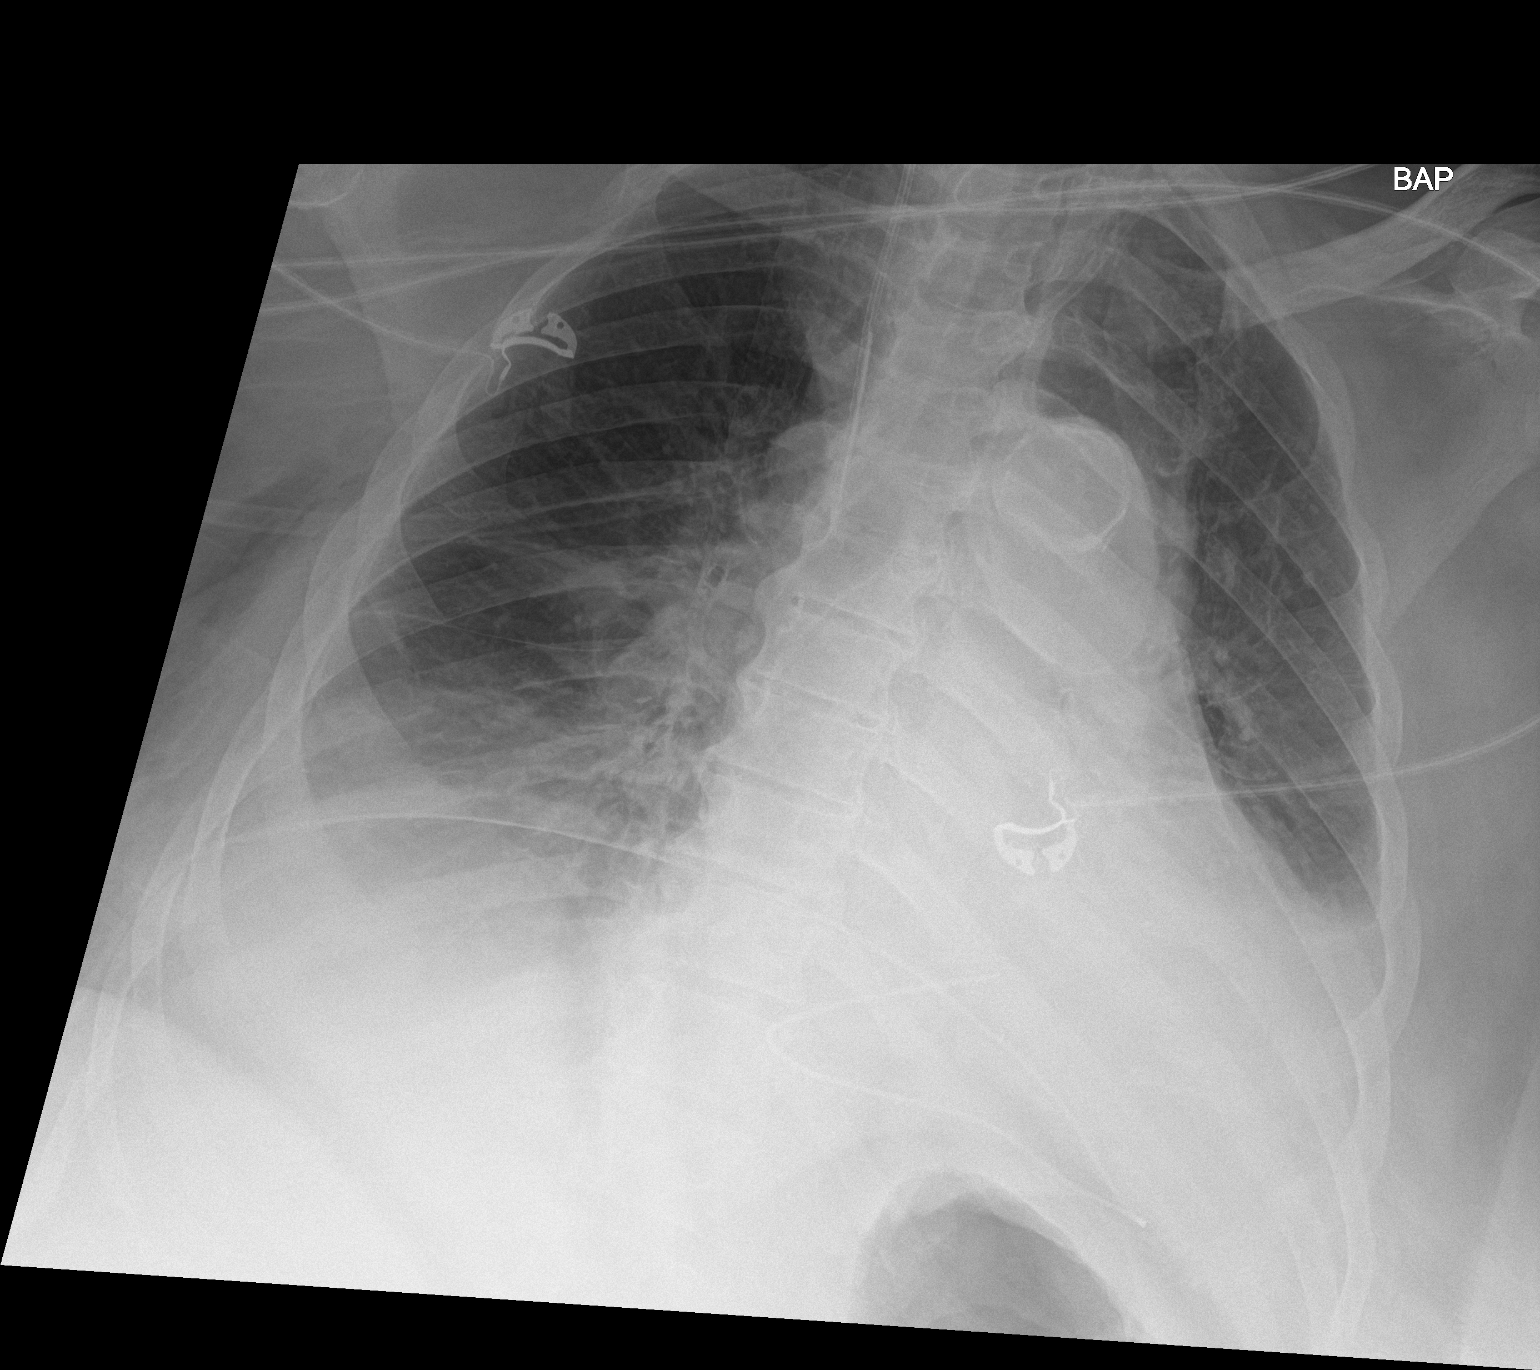

[1 of 1 positions shown; findings below may reference images not displayed]

FINDINGS: Interval placement of right IJ catheter. The tip of the catheter is
at the distal SVC. No pneumothorax identified. Cardiac enlargement
and aortic atherosclerosis. Bilateral pleural effusions are noted.
The right pleural effusion appears increased in volume from previous
exam.
IMPRESSION: 1. No pneumothorax after right IJ catheter placement.
2. Increase in volume of right pleural effusion. Stable left
effusion.

## 2020-08-19 IMAGING — DX DG CHEST 1V PORT
1 series · 1 of 1 positions shown · non-contrast
Comparison: 08/31/2019

CLINICAL DATA: Pericardial effusion

EXAM:
PORTABLE CHEST 1 VIEW

[chest]
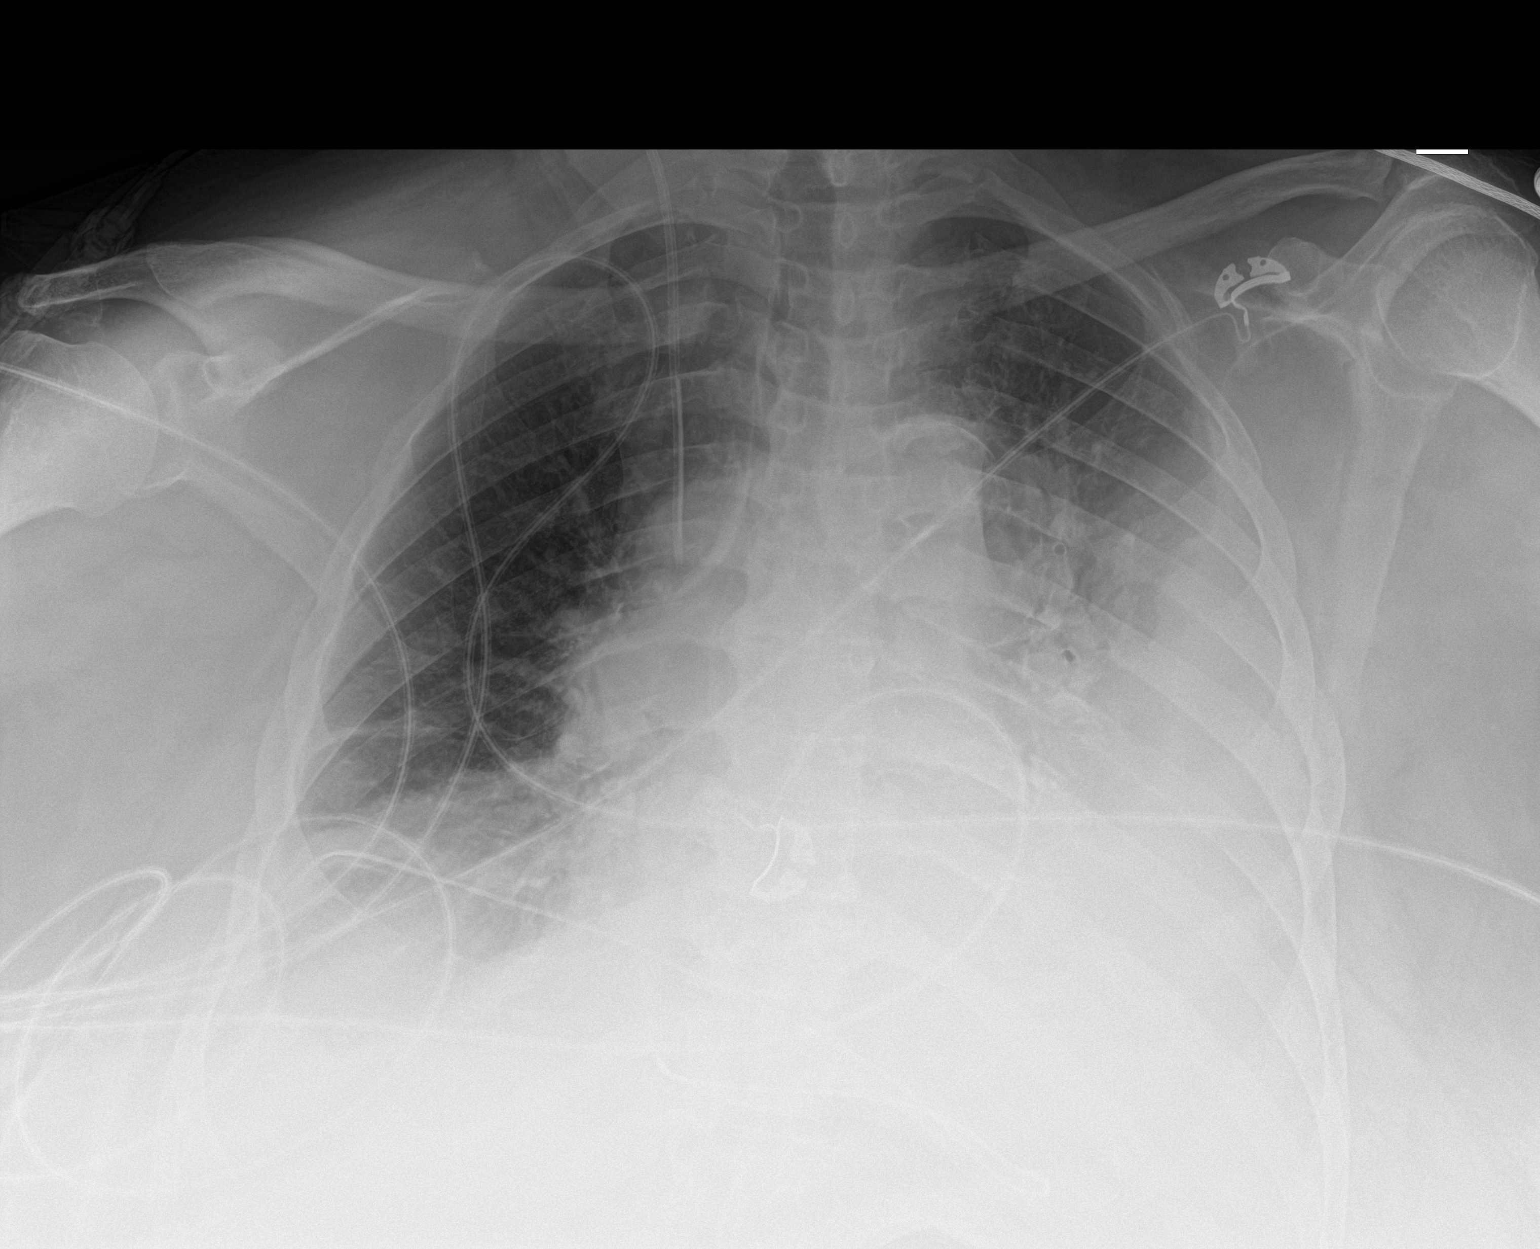

[1 of 1 positions shown; findings below may reference images not displayed]

FINDINGS: Large left pleural effusion is increased since prior study. Small
layering right pleural effusion. Bilateral lower lobe airspace
opacities, stable on the right and increasing on the left. Right
central line is unchanged.
IMPRESSION: Worsening left pleural effusion and left lower lobe airspace
disease.

Stable small right effusion and right lower lobe atelectasis or
infiltrate.

## 2020-08-22 IMAGING — DX DG CHEST 1V PORT
1 series · 1 of 1 positions shown · non-contrast
Comparison: 09/02/2019

CLINICAL DATA: Pneumothorax.

EXAM:
PORTABLE CHEST 1 VIEW

[chest ap]
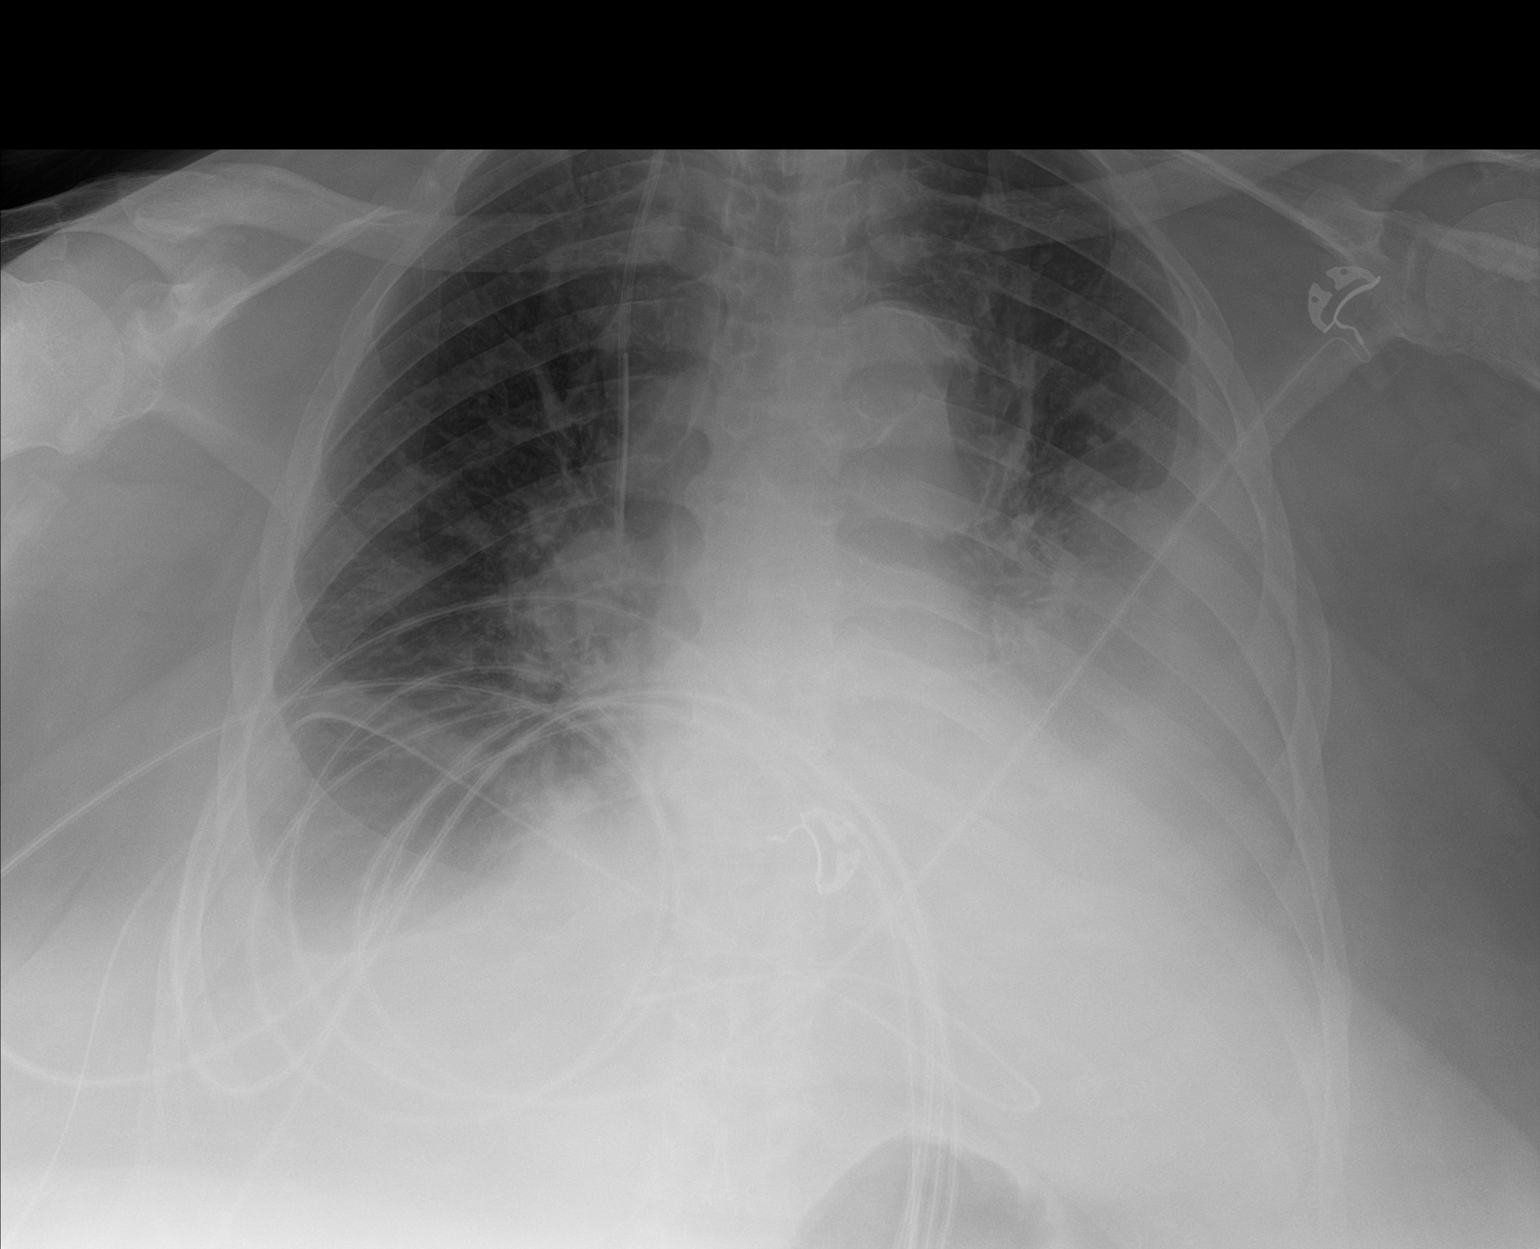

[1 of 1 positions shown; findings below may reference images not displayed]

FINDINGS: The right-sided central venous catheter is well position. There is
no pneumothorax. Again noted is a moderate to large left-sided
pleural effusion with adjacent airspace disease which may represent
compressive atelectasis. There is a right-sided chest tube in place.
There is a small to moderate size right-sided pleural effusion
prominent interstitial lung markings are noted. The heart size
remains unchanged. Aortic calcifications are noted.
IMPRESSION: 1. Lines and tubes as above.
2. Moderate to large left-sided pleural effusion with adjacent
presumed compressive atelectasis.
3. Small to moderate right-sided pleural effusion with adjacent
atelectasis.

## 2020-08-25 IMAGING — DX DG CHEST 1V PORT
1 series · 1 of 1 positions shown · non-contrast
Comparison: Portable exam 4144 hours compared to 09/05/2019

CLINICAL DATA: Chest tube removed 2 days ago, some shortness of
breath

EXAM:
PORTABLE CHEST 1 VIEW

[chest ap]
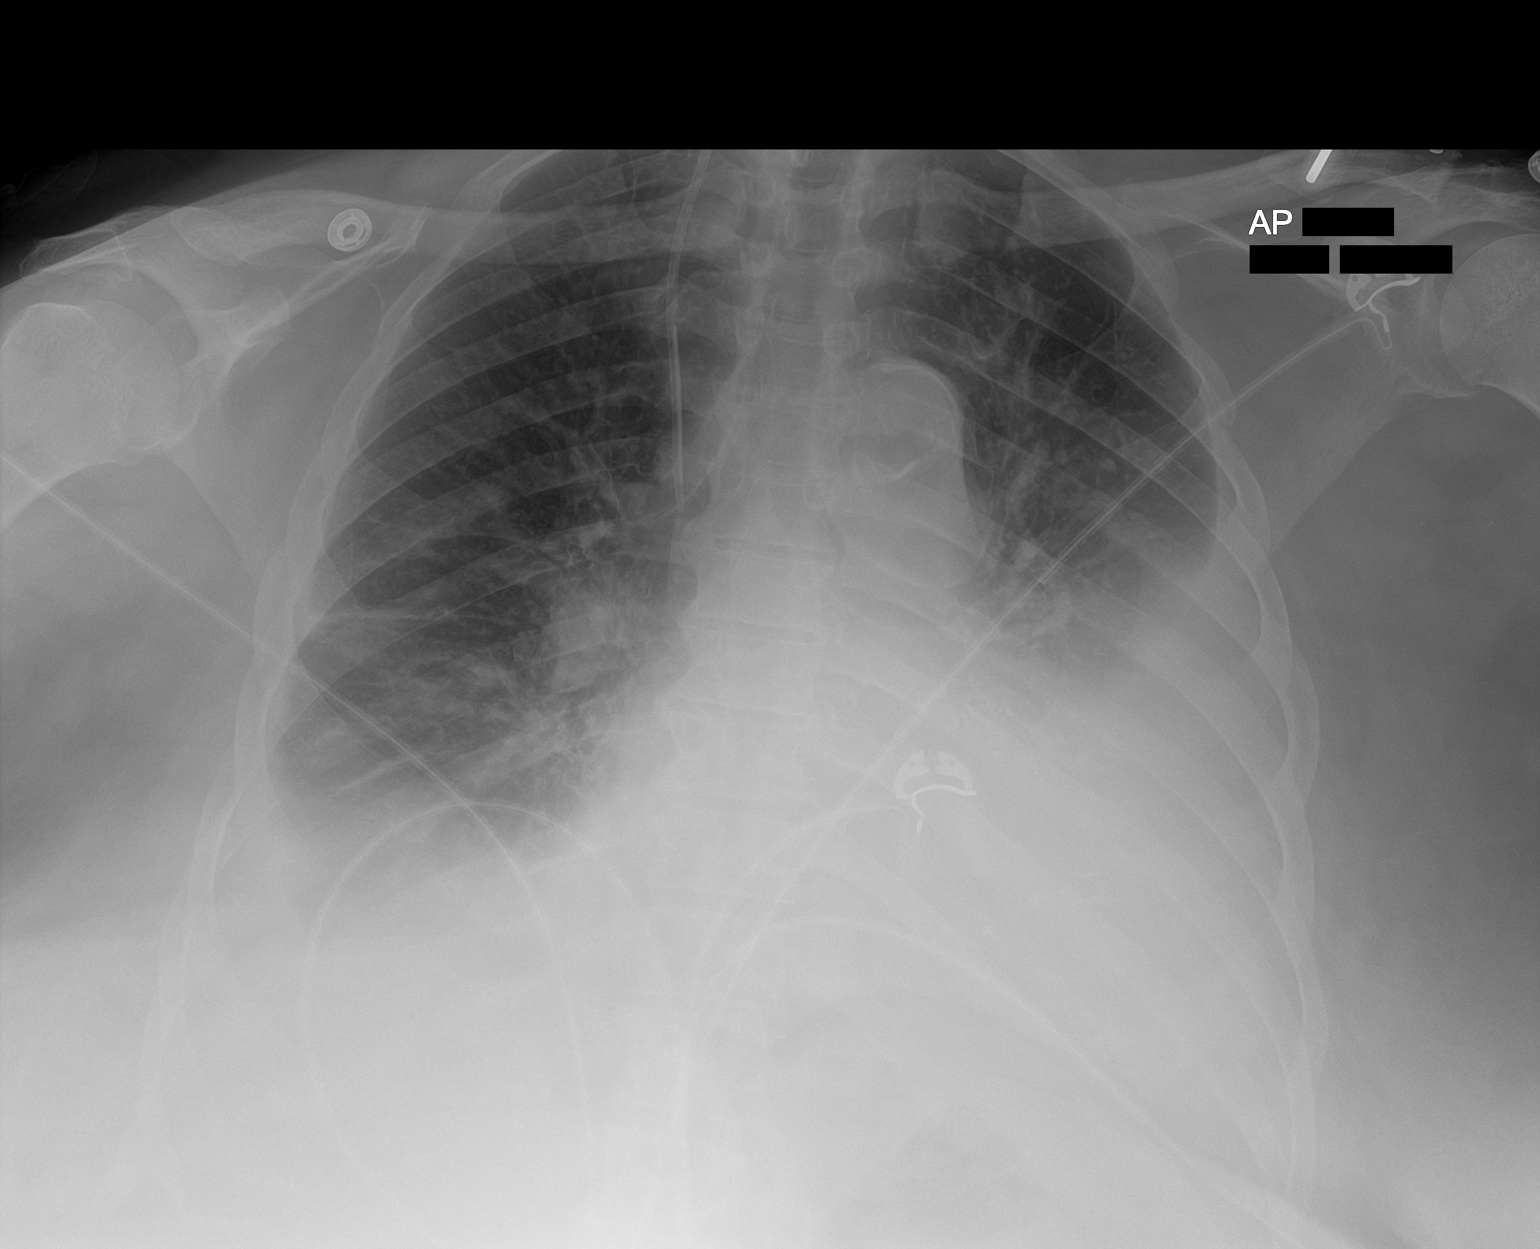

[1 of 1 positions shown; findings below may reference images not displayed]

FINDINGS: RIGHT jugular line with tip projecting over SVC.

LEFT heart border obscured by moderate-sized LEFT pleural effusion
and basilar atelectasis.

Small RIGHT pleural effusion and basilar atelectasis.

Perihilar vascular congestion and peribronchial thickening present
with probable perihilar edema.

Atherosclerotic calcification aorta.

No pneumothorax.
IMPRESSION: BILATERAL pleural effusions and basilar atelectasis, moderate LEFT
and small RIGHT.

Enlargement of cardiac silhouette with pulmonary vascular congestion
and probable mild pulmonary edema.

## 2020-09-06 DIAGNOSIS — R197 Diarrhea, unspecified: Secondary | ICD-10-CM | POA: Insufficient documentation

## 2020-09-22 ENCOUNTER — Telehealth: Payer: Self-pay

## 2020-09-22 NOTE — Telephone Encounter (Signed)
Patient called wanting to be scheduled for left hip surgery for December.  Wasn't sure if she needed to be seen by Dr. Erlinda Hong first.  Please advise.  Cb# [phone] or [phone].  Thank you

## 2020-09-23 IMAGING — CR DG CHEST 2V
2 series · 2 of 2 positions shown · non-contrast
Comparison: 09/15/2019

CLINICAL DATA: Chest pain and pleural effusion. Pericardial
effusion.

EXAM:
CHEST - 2 VIEW

[chest pa]
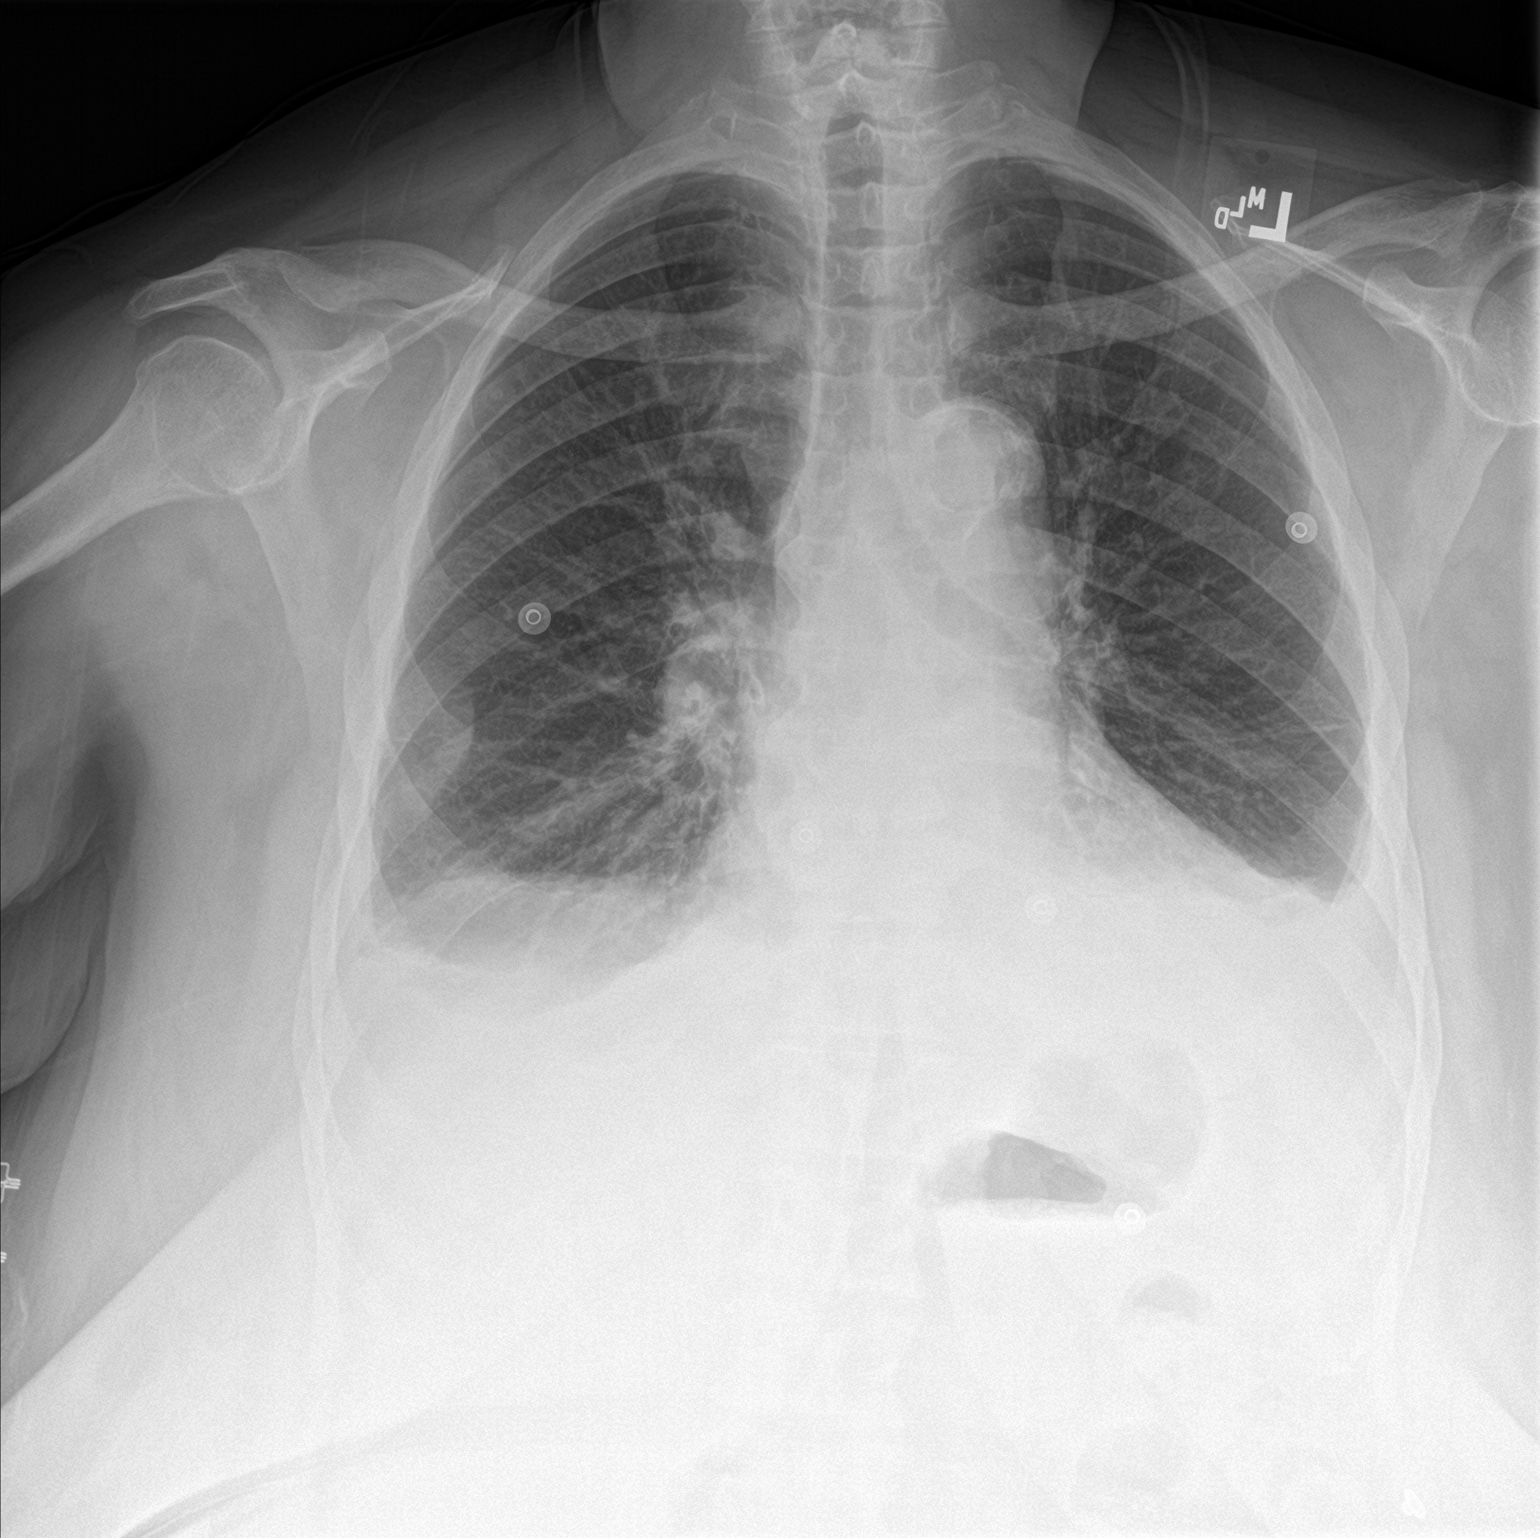

[chest lat]
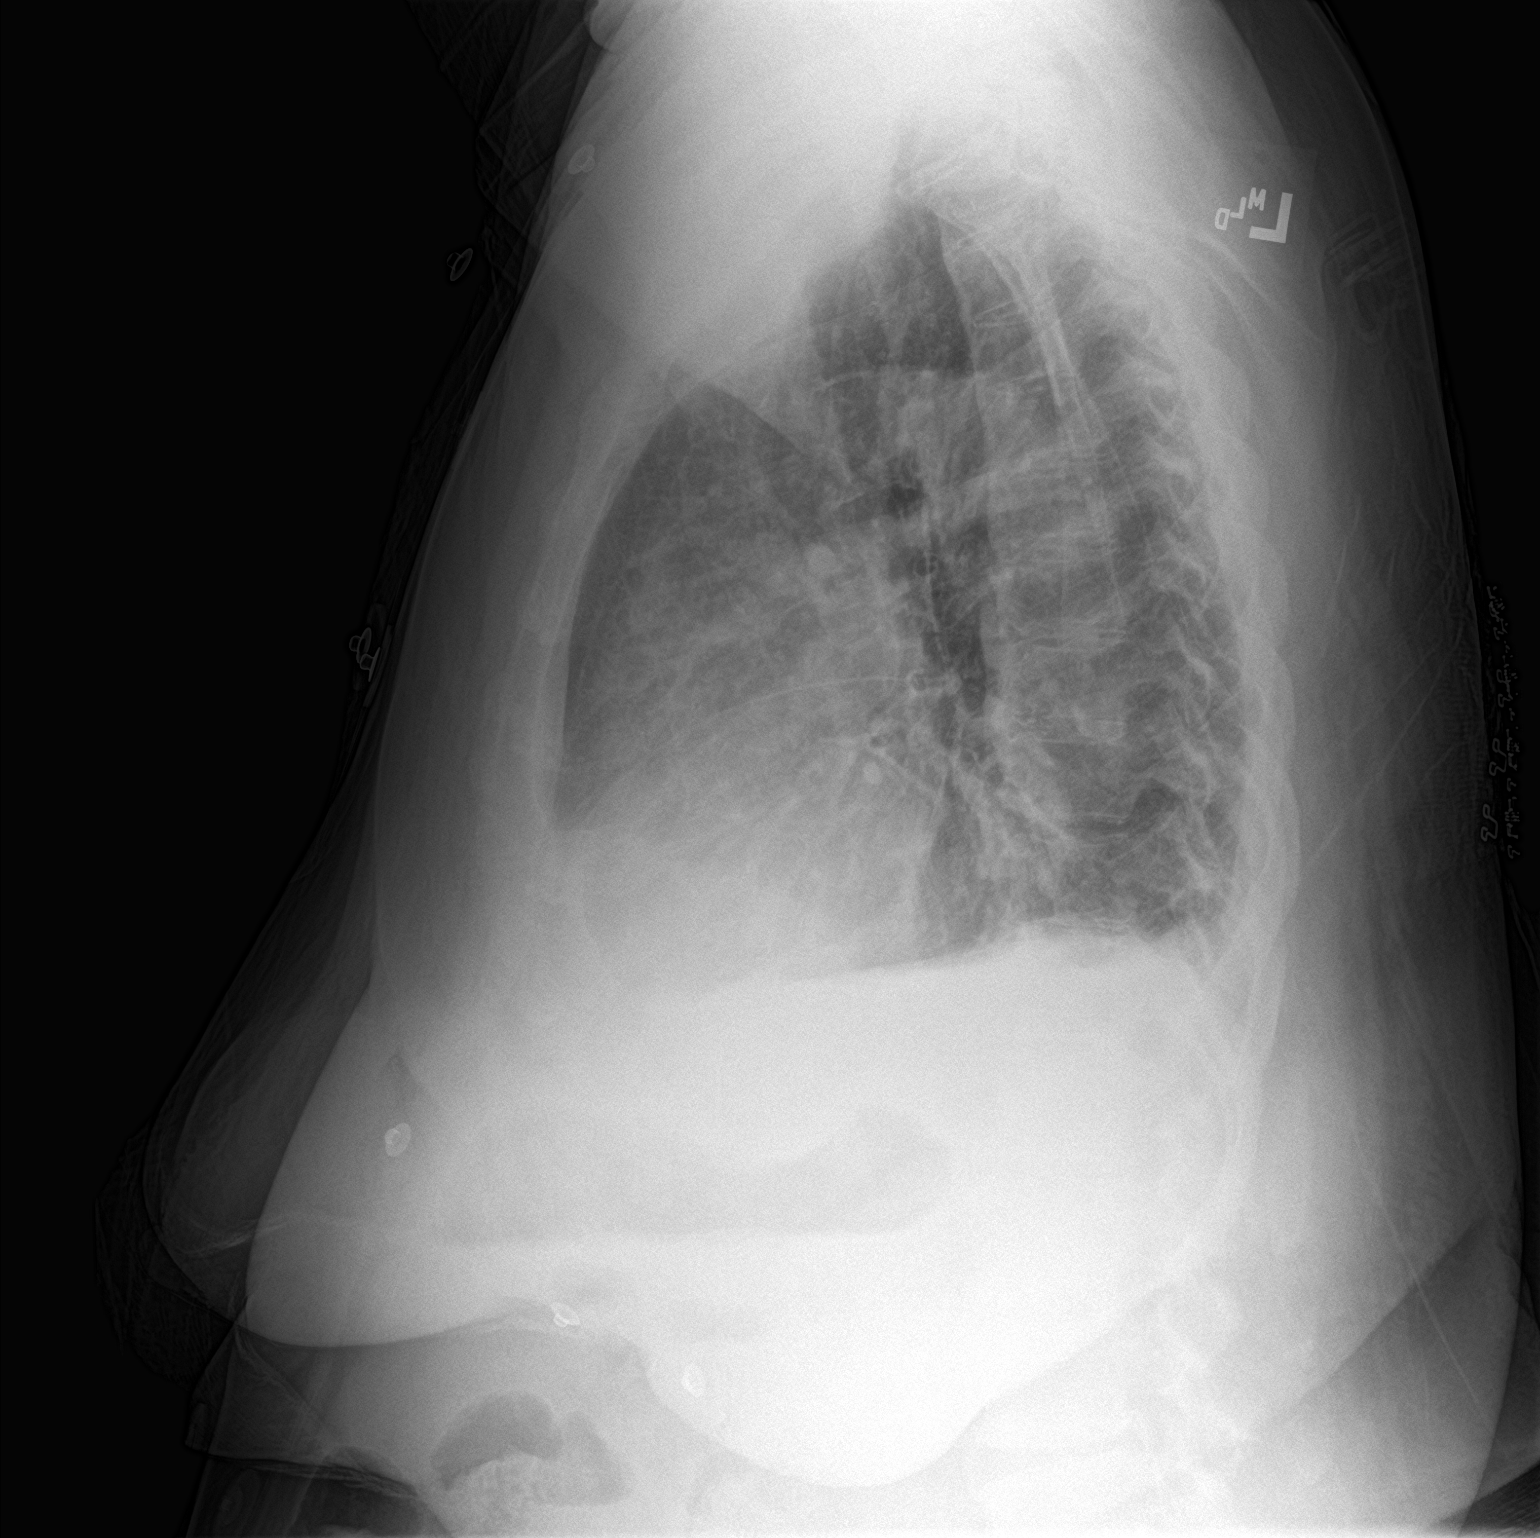

[2 of 2 positions shown; findings below may reference images not displayed]

FINDINGS: Lungs are adequately inflated demonstrate a stable small right
pleural effusion likely with associated basilar atelectasis. There
is interval improvement in a small to moderate size left pleural
effusion likely with associated basilar atelectasis. Likely stable
loculated component of pleural fluid over the lateral right base.
Cardiomediastinal silhouette and remainder of the exam is unchanged.
IMPRESSION: Interval improvement and small to moderate size left pleural
effusion likely with associated basilar atelectasis. Stable small
right pleural effusion likely with associated basilar atelectasis.

## 2020-09-23 NOTE — Telephone Encounter (Signed)
Yes she needs to be seen first.  Looks like she's had some health issues in the interim.

## 2020-09-25 NOTE — Telephone Encounter (Signed)
Called patient no answer.LMOM. She needs an office visit with Dr. Erlinda Hong.

## 2020-10-02 ENCOUNTER — Ambulatory Visit: Payer: Medicare Other | Admitting: Podiatry

## 2020-10-02 ENCOUNTER — Telehealth: Payer: Self-pay | Admitting: Podiatry

## 2020-10-02 NOTE — Telephone Encounter (Signed)
Pt called wanting to reschedule an appointment. I left a voice message letting her know that there is a cancellation for 9:30 tomorrow with Dr. Elisha Ponder.

## 2020-10-03 ENCOUNTER — Ambulatory Visit (INDEPENDENT_AMBULATORY_CARE_PROVIDER_SITE_OTHER): Payer: Medicare Other | Admitting: Podiatry

## 2020-10-03 ENCOUNTER — Encounter: Payer: Self-pay | Admitting: Podiatry

## 2020-10-03 ENCOUNTER — Other Ambulatory Visit: Payer: Self-pay

## 2020-10-03 DIAGNOSIS — L84 Corns and callosities: Secondary | ICD-10-CM | POA: Diagnosis not present

## 2020-10-03 DIAGNOSIS — N186 End stage renal disease: Secondary | ICD-10-CM

## 2020-10-03 DIAGNOSIS — M2042 Other hammer toe(s) (acquired), left foot: Secondary | ICD-10-CM

## 2020-10-03 DIAGNOSIS — M2041 Other hammer toe(s) (acquired), right foot: Secondary | ICD-10-CM | POA: Diagnosis not present

## 2020-10-03 DIAGNOSIS — M79675 Pain in left toe(s): Secondary | ICD-10-CM | POA: Diagnosis not present

## 2020-10-03 DIAGNOSIS — Z992 Dependence on renal dialysis: Secondary | ICD-10-CM

## 2020-10-03 DIAGNOSIS — B351 Tinea unguium: Secondary | ICD-10-CM

## 2020-10-03 DIAGNOSIS — E0822 Diabetes mellitus due to underlying condition with diabetic chronic kidney disease: Secondary | ICD-10-CM

## 2020-10-03 DIAGNOSIS — M79674 Pain in right toe(s): Secondary | ICD-10-CM

## 2020-10-03 DIAGNOSIS — E119 Type 2 diabetes mellitus without complications: Secondary | ICD-10-CM

## 2020-10-06 ENCOUNTER — Ambulatory Visit (INDEPENDENT_AMBULATORY_CARE_PROVIDER_SITE_OTHER): Payer: Medicare Other

## 2020-10-06 ENCOUNTER — Ambulatory Visit (INDEPENDENT_AMBULATORY_CARE_PROVIDER_SITE_OTHER): Payer: Medicare Other | Admitting: Orthopaedic Surgery

## 2020-10-06 ENCOUNTER — Encounter: Payer: Self-pay | Admitting: Orthopaedic Surgery

## 2020-10-06 VITALS — Ht 65.0 in | Wt 235.0 lb

## 2020-10-06 DIAGNOSIS — Z96641 Presence of right artificial hip joint: Secondary | ICD-10-CM | POA: Diagnosis not present

## 2020-10-06 DIAGNOSIS — M1612 Unilateral primary osteoarthritis, left hip: Secondary | ICD-10-CM

## 2020-10-06 NOTE — Progress Notes (Signed)
Office Visit Note   Patient: Brenda Arnold           Date of Birth: 12-12-1955           MRN: [id number] Visit Date: 10/06/2020              Requested by: Kelton Pillar, MD [place] [employer] [place],  [place] [address] PCP: Kelton Pillar, MD   Assessment & Plan: Visit Diagnoses:  1. Primary osteoarthritis of left hip   2. Status post total replacement of right hip     Plan: Impression is end-stage left hip DJD.  Patient is medically complex.  She did do well from her right hip replacement a couple years ago.  We will need to obtain preoperative medical and cardiac clearance prior to surgery.  She will need to stop Eliquis 3 days in advance.  We will try to do the surgery in between her dialysis days.  Based on our discussion of treatment options she has elected to proceed with total hip replacement.  She is hoping that she can get it the second or third week of December.  We will start the process of getting her cleared by her other doctors.  We will contact the patient as soon as we are able to schedule her surgery.  Total face to face encounter time was greater than 25 minutes and over half of this time was spent in counseling and/or coordination of care.  Follow-Up Instructions: Return if symptoms worsen or fail to improve.   Orders:  Orders Placed This Encounter  Procedures   XR Pelvis 1-2 Views   Prealbumin   No orders of the defined types were placed in this encounter.     Procedures: No procedures performed   Clinical Data: No additional findings.   Subjective: Chief Complaint  Patient presents with   Left Hip - Pain    [name] is a very pleasant 64 year old female who is status post right total hip replacement in 2019.  She did well from the surgery.  She has been trying to live with her severe left hip pain due to advanced degenerative joint disease but she is no longer able to withstand the pain.  She is ambulating with a walker.  She  has no quality of life and is unable to do ADLs.  She is on dialysis every Monday Wednesday Friday and takes Eliquis daily.   Review of Systems  Constitutional: Negative.   HENT: Negative.   Eyes: Negative.   Respiratory: Negative.   Cardiovascular: Negative.   Endocrine: Negative.   Musculoskeletal: Negative.   Neurological: Negative.   Hematological: Negative.   Psychiatric/Behavioral: Negative.   All other systems reviewed and are negative.    Objective: Vital Signs: Ht 5\' 5"  (1.651 m)    Wt 235 lb (106.6 kg)    BMI 39.11 kg/m   Physical Exam Vitals and nursing note reviewed.  Constitutional:      Appearance: She is well-developed.  HENT:     Head: Normocephalic and atraumatic.  Pulmonary:     Effort: Pulmonary effort is normal.  Abdominal:     Palpations: Abdomen is soft.  Musculoskeletal:     Cervical back: Neck supple.  Skin:    General: Skin is warm.     Capillary Refill: Capillary refill takes less than 2 seconds.  Neurological:     Mental Status: She is alert and oriented to person, place, and time.  Psychiatric:  Behavior: Behavior normal.        Thought Content: Thought content normal.        Judgment: Judgment normal.     Ortho Exam Left hip exam shows severe pain with any logroll or attempted range of motion.  She is unable to perform straight leg raise due to pain. Specialty Comments:  No specialty comments available.  Imaging: XR Pelvis 1-2 Views  Result Date: 10/06/2020 Severely degenerative left hip joint.  Abundant subchondral sclerosis and cyst formation    PMFS History: Patient Active Problem List   Diagnosis Date Noted   Diarrhea, unspecified 09/06/2020   Headache, unspecified 07/11/2020   Subacute osteomyelitis of right hand ([place]) 01/27/2020   Finger infection 01/26/2020   ESRD (end stage renal disease) ([place]) 01/13/2020   ESRD on dialysis [facility])    Physical debility 09/09/2019   Pericardial effusion    SOB  (shortness of breath)    Leukocytosis    Anemia of chronic disease    PAF (paroxysmal atrial fibrillation) (HCC)    Postoperative pain    Cardiac tamponade    Chest pain at rest 08/26/2019   Tachycardia 08/26/2019   Pericarditis 08/26/2019   Angina pectoris, unspecified ([place]) 08/25/2019   CAD (coronary artery disease) 11/05/2018   A-fib ([place]) 11/05/2018   Atrial fibrillation with RVR ([place])    Hypotension 11/04/2018   ESRD on hemodialysis ([place]) 11/04/2018   DVT (deep venous thrombosis) ([place]) 11/03/2018   Hyperkalemia 10/02/2018   Primary osteoarthritis of right hip 09/15/2018   Status post total replacement of right hip 09/15/2018   Varicose veins of bilateral lower extremities with other complications 38/25/0539   Hypercalcemia 05/17/2017   Secondary hyperparathyroidism of renal origin ([place]) 02/21/2017   Pruritus, unspecified 02/13/2017   Pain, unspecified 02/13/2017   Other specified coagulation defects ([place]) 02/13/2017   Iron deficiency anemia, unspecified 02/13/2017   Anticoagulation goal of INR 2 to 3 09/30/2014   Pain in lower limb 05/02/2014   Onychomycosis due to dermatophyte 04/06/2013   Pain in joint, ankle and foot 04/06/2013   Bradycardia 11/06/2012   Left-sided epistaxis 11/05/2012   DM (diabetes mellitus) ([place]) 11/05/2012   OSA (obstructive sleep apnea) 11/05/2012   Acute posterior epistaxis 11/05/2012   CKD (chronic kidney disease) 11/05/2012   End stage renal disease ([place]) 06/25/2012   Type 2 diabetes mellitus with complication, without long-term current use of insulin ([place]) 12/20/2009   OBESITY 12/20/2009   OBESITY-MORBID (>100') 12/20/2009   Essential hypertension 12/20/2009   Paroxysmal atrial fibrillation ([place]) 12/20/2009   Chest pain 12/20/2009   ABNORMAL CV (STRESS) TEST 12/20/2009   Past Medical History:  Diagnosis Date   Anemia    Arthritis    Asthma    Complication of anesthesia    difficulty  with getting oxygen saturation up- "patient was not aware"  Bottom of feet burning in PACU   Constipation    because of Iron   Diabetes mellitus    not on meds   DVT (deep venous thrombosis) ([place]) 2019   post hip replacement - right   Dyslipidemia    Epistaxis 11/05/2012   ESRD (end stage renal disease) on dialysis ([place])    "MWF; Jeneen Rinks" (09/15/2018)- started 02/13/2017   History of blood transfusion    HTN (hypertension)    Hyperparathyroidism due to renal insufficiency ([place])    Obesity    s/p panniculectomy   Paroxysmal A-fib ([place])    a. chronic coumadin;  b. 12/2009 Echo: EF 60-65%, Gr  1 DD.   PCOS (polycystic ovarian syndrome)    Pericarditis 08/2019   Pneumonia    Seasonal allergies    Sleep apnea    a. not using CPAP, last study  >8 yrs   Vitamin D deficiency     Family History  Problem Relation Age of Onset   Lung cancer Father        died @ 75   Hypertension Mother    Diabetes Brother    Kidney disease Brother     Past Surgical History:  Procedure Laterality Date   ABDOMINAL HYSTERECTOMY     with panniculctomy   AV FISTULA PLACEMENT  09/02/2012   Procedure: ARTERIOVENOUS (AV) FISTULA CREATION;  Surgeon: Elam Dutch, MD;  Location: [facility];  Service: Vascular;  Laterality: Left;  Creation of Left Radial-Cephalic Fistula    BREAST SURGERY     Biopsy right breast   COLONOSCOPY     COLONOSCOPY W/ BIOPSIES AND POLYPECTOMY     DILATION AND CURETTAGE OF UTERUS     KNEE ARTHROSCOPY Right    PANNICULECTOMY     REVISON OF ARTERIOVENOUS FISTULA Left 04/27/2014   Procedure: REVISON OF LEFT RADIAL-CEPHALIC ARTERIOVENOUS FISTULA;  Surgeon: Mal Misty, MD;  Location: [facility];  Service: Vascular;  Laterality: Left;   REVISON OF ARTERIOVENOUS FISTULA Left 01/13/2020   Procedure: REVISON OF ARTERIOVENOUS FISTULA;  Surgeon: Marty Heck, MD;  Location: [facility];  Service: Vascular;  Laterality: Left;   TOTAL HIP ARTHROPLASTY Right  09/15/2018   TOTAL HIP ARTHROPLASTY Right 09/15/2018   Procedure: RIGHT TOTAL HIP ARTHROPLASTY ANTERIOR APPROACH;  Surgeon: Leandrew Koyanagi, MD;  Location: [facility];  Service: Orthopedics;  Laterality: Right;   UVULOPLASTY     VIDEO ASSISTED THORACOSCOPY (VATS)/WEDGE RESECTION Right 08/31/2019   Procedure: VIDEO ASSISTED THORACOSCOPY/ DRAINAGE OF PERICARDIAL EFFUSION/ PERICARDIAL WINDOW/ ABORTED PERICARDIOCENTESIS ;  Surgeon: Lajuana Matte, MD;  Location: [facility];  Service: Thoracic;  Laterality: Right;   Social History   Occupational History   Occupation: CORRESPONDENT ADMIN.    Employer: [employer]  Tobacco Use   Smoking status: Never Smoker   Smokeless tobacco: Never Used  Vaping Use   Vaping Use: Never used  Substance and Sexual Activity   Alcohol use: No    Alcohol/week: 0.0 standard drinks   Drug use: No   Sexual activity: Not on file

## 2020-10-07 LAB — EXTRA LAV TOP TUBE

## 2020-10-07 LAB — PREALBUMIN: Prealbumin: 33 mg/dL (ref 17–34)

## 2020-10-07 NOTE — Progress Notes (Signed)
ANNUAL DIABETIC FOOT EXAM  Subjective: Brenda Arnold presents today for for annual diabetic foot examination, at risk foot care with h/o NIDDM with ESRD on hemodialysis and painful callus(es) b/l and painful thick toenails that are difficult to trim. Painful toenails interfere with ambulation. Aggravating factors include wearing enclosed shoe gear. Pain is relieved with periodic professional debridement. Painful calluses are aggravated when weightbearing with and without shoegear. Pain is relieved with periodic professional debridement.  Patient relates 20 year h/o diabetes.  Patient does have h/o ulcers of LE treated at [facility].  Patient does have symptoms of foot numbness.  Patient denies symptoms of foot tingling.  Patient denies symptoms of burning in feet.   Patient did not check blood glucose this morning, but states it stays in the range of 120-135 mg/dl.  Kelton Pillar, MD is patient's PCP.   Past Medical History:  Diagnosis Date   Anemia    Arthritis    Asthma    Complication of anesthesia    difficulty with getting oxygen saturation up- "patient was not aware"  Bottom of feet burning in PACU   Constipation    because of Iron   Diabetes mellitus    not on meds   DVT (deep venous thrombosis) ([place]) 2019   post hip replacement - right   Dyslipidemia    Epistaxis 11/05/2012   ESRD (end stage renal disease) on dialysis ([place])    "MWF; Jeneen Rinks" (09/15/2018)- started 02/13/2017   History of blood transfusion    HTN (hypertension)    Hyperparathyroidism due to renal insufficiency (HCC)    Obesity    s/p panniculectomy   Paroxysmal A-fib ([place])    a. chronic coumadin;  b. 12/2009 Echo: EF 60-65%, Gr 1 DD.   PCOS (polycystic ovarian syndrome)    Pericarditis 08/2019   Pneumonia    Seasonal allergies    Sleep apnea    a. not using CPAP, last study  >8 yrs   Vitamin D deficiency    Patient Active Problem List    Diagnosis Date Noted   Diarrhea, unspecified 09/06/2020   Headache, unspecified 07/11/2020   Subacute osteomyelitis of right hand ([place]) 01/27/2020   Finger infection 01/26/2020   ESRD (end stage renal disease) ([place]) 01/13/2020   ESRD on dialysis [facility])    Physical debility 09/09/2019   Pericardial effusion    SOB (shortness of breath)    Leukocytosis    Anemia of chronic disease    PAF (paroxysmal atrial fibrillation) (HCC)    Postoperative pain    Cardiac tamponade    Chest pain at rest 08/26/2019   Tachycardia 08/26/2019   Pericarditis 08/26/2019   Angina pectoris, unspecified ([place]) 08/25/2019   CAD (coronary artery disease) 11/05/2018   A-fib ([place]) 11/05/2018   Atrial fibrillation with RVR ([place])    Hypotension 11/04/2018   ESRD on hemodialysis ([place]) 11/04/2018   DVT (deep venous thrombosis) ([place]) 11/03/2018   Hyperkalemia 10/02/2018   Primary osteoarthritis of right hip 09/15/2018   Status post total replacement of right hip 09/15/2018   Varicose veins of bilateral lower extremities with other complications 38/46/6599   Hypercalcemia 05/17/2017   Secondary hyperparathyroidism of renal origin ([place]) 02/21/2017   Pruritus, unspecified 02/13/2017   Pain, unspecified 02/13/2017   Other specified coagulation defects ([place]) 02/13/2017   Iron deficiency anemia, unspecified 02/13/2017   Anticoagulation goal of INR 2 to 3 09/30/2014   Pain in lower limb 05/02/2014   Onychomycosis due to dermatophyte  04/06/2013   Pain in joint, ankle and foot 04/06/2013   Bradycardia 11/06/2012   Left-sided epistaxis 11/05/2012   DM (diabetes mellitus) ([place]) 11/05/2012   OSA (obstructive sleep apnea) 11/05/2012   Acute posterior epistaxis 11/05/2012   CKD (chronic kidney disease) 11/05/2012   End stage renal disease ([place]) 06/25/2012   Type 2 diabetes mellitus with complication, without long-term current use of insulin ([place]) 12/20/2009   OBESITY  12/20/2009   OBESITY-MORBID (>100') 12/20/2009   Essential hypertension 12/20/2009   Paroxysmal atrial fibrillation ([place]) 12/20/2009   Chest pain 12/20/2009   ABNORMAL CV (STRESS) TEST 12/20/2009   Past Surgical History:  Procedure Laterality Date   ABDOMINAL HYSTERECTOMY     with panniculctomy   AV FISTULA PLACEMENT  09/02/2012   Procedure: ARTERIOVENOUS (AV) FISTULA CREATION;  Surgeon: Elam Dutch, MD;  Location: [facility];  Service: Vascular;  Laterality: Left;  Creation of Left Radial-Cephalic Fistula    BREAST SURGERY     Biopsy right breast   COLONOSCOPY     COLONOSCOPY W/ BIOPSIES AND POLYPECTOMY     DILATION AND CURETTAGE OF UTERUS     KNEE ARTHROSCOPY Right    PANNICULECTOMY     REVISON OF ARTERIOVENOUS FISTULA Left 04/27/2014   Procedure: REVISON OF LEFT RADIAL-CEPHALIC ARTERIOVENOUS FISTULA;  Surgeon: Mal Misty, MD;  Location: [facility];  Service: Vascular;  Laterality: Left;   REVISON OF ARTERIOVENOUS FISTULA Left 01/13/2020   Procedure: REVISON OF ARTERIOVENOUS FISTULA;  Surgeon: Marty Heck, MD;  Location: [facility] OR;  Service: Vascular;  Laterality: Left;   TOTAL HIP ARTHROPLASTY Right 09/15/2018   TOTAL HIP ARTHROPLASTY Right 09/15/2018   Procedure: RIGHT TOTAL HIP ARTHROPLASTY ANTERIOR APPROACH;  Surgeon: Leandrew Koyanagi, MD;  Location: [facility];  Service: Orthopedics;  Laterality: Right;   UVULOPLASTY     VIDEO ASSISTED THORACOSCOPY (VATS)/WEDGE RESECTION Right 08/31/2019   Procedure: VIDEO ASSISTED THORACOSCOPY/ DRAINAGE OF PERICARDIAL EFFUSION/ PERICARDIAL WINDOW/ ABORTED PERICARDIOCENTESIS ;  Surgeon: Lajuana Matte, MD;  Location: [facility] OR;  Service: Thoracic;  Laterality: Right;   Current Outpatient Medications on File Prior to Visit  Medication Sig Dispense Refill   acetaminophen (TYLENOL) 650 MG CR tablet Take 650 mg by mouth every 8 (eight) hours as needed for pain.     amiodarone (PACERONE) 200 MG tablet Take 1 tablet (200 mg total) by  mouth 2 (two) times daily. 180 tablet 3   B Complex-C-Folic Acid (DIALYVITE TABLET) TABS Take 1 tablet by mouth daily.     bisacodyl (DULCOLAX) 5 MG EC tablet Take 5 mg by mouth daily as needed for moderate constipation.     calcitRIOL (ROCALTROL) 0.25 MCG capsule Take 3 capsules (0.75 mcg total) by mouth every Monday, Wednesday, and Friday with hemodialysis.     colchicine 0.6 MG tablet TAKE HALF TABLET BY MOUTH EVERY MONDAY, WEDNESDAY, AND FRIDAY WITH HEMODIALYSIS. 45 tablet 1   dextrose 50 % solution Dextrose 50%     diphenhydrAMINE (BENADRYL) 25 MG tablet Take 25 mg by mouth daily as needed for itching.     ELIQUIS 5 MG TABS tablet Take 5 mg by mouth 2 (two) times daily.      ethyl chloride spray Apply 1 application topically daily as needed (port access).      folic acid-vitamin b complex-vitamin c-selenium-zinc (DIALYVITE) 3 MG TABS tablet Take 1 tablet by mouth every evening.     gabapentin (NEURONTIN) 100 MG capsule Take 100 mg by mouth at bedtime.  3  LOPERAMIDE HCL PO Take by mouth.     Menthol-Methyl Salicylate (MUSCLE RUB) 10-15 % CREA Apply 1 application topically as needed for muscle pain.  0   Methoxy PEG-Epoetin Beta (MIRCERA IJ) Mircera     midodrine (PROAMATINE) 10 MG tablet Take 1 tablet (10 mg total) by mouth 3 (three) times daily with meals.     ondansetron (ZOFRAN) 4 MG tablet Take 4 mg by mouth See admin instructions. Take 4 mg after dialysis Mon, Wed, and Fri     oxyCODONE (ROXICODONE) 5 MG immediate release tablet Take 1 tablet (5 mg total) by mouth every 6 (six) hours as needed for severe pain. 10 tablet 0   sucroferric oxyhydroxide (VELPHORO) 500 MG chewable tablet Chew 500-1,000 mg by mouth See admin instructions. Take 1000 mg 3 times daily with meals and 500 mg daily with a snack.     VITAMIN D, CHOLECALCIFEROL, PO Take by mouth.     No current facility-administered medications on file prior to visit.    Allergies  Allergen Reactions   Iodine  Shortness Of Breath   Other Shortness Of Breath and Anaphylaxis   Shellfish Allergy Shortness Of Breath   Norvasc [Amlodipine] Swelling   Social History   Occupational History   Occupation: CORRESPONDENT ADMIN.    Employer: [employer]  Tobacco Use   Smoking status: Never Smoker   Smokeless tobacco: Never Used  Vaping Use   Vaping Use: Never used  Substance and Sexual Activity   Alcohol use: No    Alcohol/week: 0.0 standard drinks   Drug use: No   Sexual activity: Not on file   Family History  Problem Relation Age of Onset   Lung cancer Father        died @ 39   Hypertension Mother    Diabetes Brother    Kidney disease Brother    Immunization History  Administered Date(s) Administered   Hepatitis B, adult 03/13/2017, 04/10/2017, 05/15/2017, 09/10/2017, 12/10/2017, 01/14/2018, 02/11/2018, 06/10/2018, 09/11/2018   Influenza,inj,Quad PF,6+ Mos 08/20/2019   Influenza-Unspecified 09/09/2018, 08/20/2019   Moderna SARS-COVID-2 Vaccination 02/18/2020, 03/15/2020   Pneumococcal Polysaccharide-23 04/15/2017     Review of Systems: Negative except as noted in the HPI.  Objective: There were no vitals filed for this visit.  Brenda Arnold is a pleasant 64 y.o. African American female in NAD. AAO X 3.  Vascular Examination: Capillary fill time to digits <3 seconds b/l lower extremities. Palpable DP pulse(s) b/l lower extremities Nonpalpable PT pulse(s) b/l lower extremities. Pedal hair absent. Lower extremity skin temperature gradient within normal limits. No pain with calf compression b/l. No edema noted b/l lower extremities. No ischemia or gangrene noted b/l lower extremities.  Dermatological Examination: Pedal skin with normal turgor, texture and tone bilaterally. No open wounds bilaterally. No interdigital macerations bilaterally. Toenails 1-5 b/l elongated, discolored, dystrophic, thickened, crumbly with subungual debris and tenderness to  dorsal palpation. Hyperkeratotic lesion(s) submet head 5 left foot and submet head 5 right foot.  No erythema, no edema, no drainage, no flocculence.  Musculoskeletal Examination: Normal muscle strength 5/5 to all lower extremity muscle groups bilaterally. Hammertoes noted to the 1-5 bilaterally.  Footwear Assessment: Does the patient wear appropriate shoes? Yes. Does the patient need inserts/orthotics? Yes.  Neurological Examination: Pt has subjective symptoms of neuropathy. Protective sensation intact 5/5 intact bilaterally with 10g monofilament b/l. Vibratory sensation intact b/l.  Assessment: 1. Pain due to onychomycosis of toenails of both feet   2. Callus   3. Acquired  hammertoes of both feet   4. Diabetes mellitus due to underlying condition with chronic kidney disease on chronic dialysis, without long-term current use of insulin ([place])   5. Encounter for diabetic foot exam ([place])      ADA Risk Categorization: High Risk  Patient has one or more of the following: Loss of protective sensation Absent pedal pulses Severe Foot deformity History of foot ulcer  Plan: -Examined patient. -Diabetic foot examination performed on today's visit. -Patient to continue soft, supportive shoe gear daily. Start procedure for diabetic shoes. Patient qualifies based on diagnoses. -Toenails 1-5 b/l were debrided in length and girth with sterile nail nippers and dremel without iatrogenic bleeding.  -Callus(es) submet head 5 left foot and submet head 5 right foot pared utilizing sterile scalpel blade without complication or incident. Total number debrided =2. -Patient to report any pedal injuries to medical professional immediately. -Patient/POA to call should there be question/concern in the interim.  Return in about 3 months (around 01/03/2021) for diabetic toenails, corn(s)/callus(es).  Marzetta Board, DPM

## 2020-10-24 ENCOUNTER — Telehealth: Payer: Self-pay | Admitting: *Deleted

## 2020-10-24 NOTE — Telephone Encounter (Signed)
Patient with diagnosis of afib on Eliquis for anticoagulation.    Procedure: left THA Date of procedure: TBD  CHA2DS2-VASc Score = 6  This indicates a 9.7% annual risk of stroke. The patient's score is based upon: CHF History: 0 HTN History: 1 Diabetes History: 1 Stroke History: 2 (provoked VTE after hip surgery) Vascular Disease History: 1 Age Score: 0 Gender Score: 1   DVT in 2019 - provoked after hip replacement, had been off of warfarin at the time.  ESRD on dialysis Platelet count 114K earlier this year while hospitalized - usually ~250K  Typically hold Eliquis for 3 days prior to THA. In setting of ESRD as well as prior provoked VTE after other hip was replaced (was not on long term anticoag at the time), will defer to MD for input.

## 2020-10-24 NOTE — Telephone Encounter (Signed)
Pharmacy, can you please comment on how long patient can hold Eliquis for upcoming procedure?  Thank you! 

## 2020-10-24 NOTE — Telephone Encounter (Signed)
I am fine with hold 3 days prior to procedure

## 2020-10-24 NOTE — Telephone Encounter (Signed)
   Primary Cardiologist: Fransico Him, MD  Chart reviewed as part of pre-operative protocol coverage. Patient was last seen by Dr. Radford Pax in 08/2020 at which time she was doing well form a cardiac standpoint. Patient was contacted today for further pre-op evaluation and reported doing well since last visit. No chest pain, shortness of breath, orthopnea, PND, lower extremity edema, palpitations, dizziness (except occasionally when BP drops with dialysis), or syncope. Patient's activity is limited by her hip pain but it sounds like she can still complete >4.0. She is able to go up a flight up steps if necessary and lives alone so she takes care of all household chores such as sweeping, taking out the trash, and carrying in the groceries. Given past medical history and time since last visit, based on ACC/AHA guidelines, ALEXCIS BICKING would be at acceptable risk for the planned procedure without further cardiovascular testing.   Per Dr. Heron Nay and Pharmacy, patient can hold Eliquis for 3 days prior to procedure. This should be restarted as soon as able postoperatively.   I will route this recommendation to the requesting party via Epic fax function and remove from pre-op pool.  Please call with questions.  Darreld Mclean, PA-C 10/24/2020, 5:02 PM

## 2020-10-24 NOTE — Telephone Encounter (Signed)
   [facility] Pre-operative Risk Assessment    HEARTCARE STAFF: - Please ensure there is not already an duplicate clearance open for this procedure. - Under Visit Info/Reason for Call, type in Other and utilize the format Clearance MM/DD/YY or Clearance TBD. Do not use dashes or single digits. - If request is for dental extraction, please clarify the # of teeth to be extracted.  Request for surgical clearance:  1. What type of surgery is being performed? LEFT TOTAL HIP ARTHROPLASTY   2. When is this surgery scheduled? TBD   3. What type of clearance is required (medical clearance vs. Pharmacy clearance to hold med vs. Both)? BOTH  4. Are there any medications that need to be held prior to surgery and how long? ELIQUIS   5. Practice name and name of physician performing surgery? [facility]; DR. Legrand Como XU   6. What is the office phone number? [phone]   7.   What is the office fax number? [place]   Anesthesia type (None, local, MAC, general) ? SPINAL   Julaine Hua 10/24/2020, 11:29 AM  _________________________________________________________________   (provider comments below)

## 2020-11-06 ENCOUNTER — Telehealth: Payer: Self-pay | Admitting: Orthopaedic Surgery

## 2020-11-06 NOTE — Telephone Encounter (Signed)
Received $25.00 check and disability/FLMA paperwork/ forwarding to [place] today

## 2020-11-13 ENCOUNTER — Other Ambulatory Visit: Payer: Medicare Other | Admitting: *Deleted

## 2020-11-13 ENCOUNTER — Other Ambulatory Visit: Payer: Self-pay

## 2020-11-13 DIAGNOSIS — I480 Paroxysmal atrial fibrillation: Secondary | ICD-10-CM

## 2020-11-13 LAB — ALT: ALT: 21 IU/L (ref 0–32)

## 2020-11-13 LAB — TSH: TSH: 1.73 u[IU]/mL (ref 0.450–4.500)

## 2020-11-16 ENCOUNTER — Other Ambulatory Visit: Payer: Self-pay | Admitting: Physician Assistant

## 2020-11-16 ENCOUNTER — Ambulatory Visit (INDEPENDENT_AMBULATORY_CARE_PROVIDER_SITE_OTHER): Payer: Medicare Other | Admitting: Internal Medicine

## 2020-11-16 ENCOUNTER — Other Ambulatory Visit: Payer: Self-pay

## 2020-11-16 DIAGNOSIS — I480 Paroxysmal atrial fibrillation: Secondary | ICD-10-CM

## 2020-11-16 LAB — PULMONARY FUNCTION TEST
DL/VA % pred: 107 %
DL/VA: 4.45 ml/min/mmHg/L
DLCO cor % pred: 79 %
DLCO cor: 16.99 ml/min/mmHg
DLCO unc % pred: 79 %
DLCO unc: 16.99 ml/min/mmHg
FEF 25-75 Post: 2.02 L/sec
FEF 25-75 Pre: 1.88 L/sec
FEF2575-%Change-Post: 7 %
FEF2575-%Pred-Post: 96 %
FEF2575-%Pred-Pre: 89 %
FEV1-%Change-Post: 0 %
FEV1-%Pred-Post: 88 %
FEV1-%Pred-Pre: 88 %
FEV1-Post: 1.94 L
FEV1-Pre: 1.95 L
FEV1FVC-%Change-Post: 3 %
FEV1FVC-%Pred-Pre: 102 %
FEV6-%Change-Post: -3 %
FEV6-%Pred-Post: 85 %
FEV6-%Pred-Pre: 88 %
FEV6-Post: 2.32 L
FEV6-Pre: 2.41 L
FEV6FVC-%Pred-Post: 103 %
FEV6FVC-%Pred-Pre: 103 %
FVC-%Change-Post: -3 %
FVC-%Pred-Post: 82 %
FVC-%Pred-Pre: 85 %
FVC-Post: 2.32 L
FVC-Pre: 2.41 L
Post FEV1/FVC ratio: 84 %
Post FEV6/FVC ratio: 100 %
Pre FEV1/FVC ratio: 81 %
Pre FEV6/FVC Ratio: 100 %
RV % pred: 78 %
RV: 1.68 L
TLC % pred: 72 %
TLC: 3.86 L

## 2020-11-16 MED ORDER — METHOCARBAMOL 500 MG PO TABS: 500.0000 mg | ORAL_TABLET | Freq: Two times a day (BID) | ORAL | 0 refills | Status: DC | PRN

## 2020-11-16 MED ORDER — CEPHALEXIN 500 MG PO CAPS: 500.0000 mg | ORAL_CAPSULE | Freq: Four times a day (QID) | ORAL | 0 refills | Status: DC

## 2020-11-16 MED ORDER — ONDANSETRON HCL 4 MG PO TABS: 4.0000 mg | ORAL_TABLET | Freq: Three times a day (TID) | ORAL | 0 refills | Status: DC | PRN

## 2020-11-16 MED ORDER — OXYCODONE-ACETAMINOPHEN 5-325 MG PO TABS: 1.0000 | ORAL_TABLET | Freq: Four times a day (QID) | ORAL | 0 refills | Status: DC | PRN

## 2020-11-16 MED ORDER — DOCUSATE SODIUM 100 MG PO CAPS: 100.0000 mg | ORAL_CAPSULE | Freq: Every day | ORAL | 2 refills | Status: AC | PRN

## 2020-11-16 NOTE — Telephone Encounter (Signed)
I called and sw pt and she states that she is taking eliquis and was instructed to stop this three days prior to surgery. Advised I will call her with any other information should you need.

## 2020-11-16 NOTE — Telephone Encounter (Signed)
Ok cool.  Thanks for calling.  We will just have her restart after surgery.  It will be in d/c info

## 2020-11-16 NOTE — Telephone Encounter (Signed)
I will be calling in the patient's post-op meds this week for her upcoming total hip replacement next Friday.  It appears that she has a hx of dvt and eliquis is written in chart, but last rx was a while ago.  Is it possible for you guys to call her and see if she is still taking this?  Trying to figure out if I need to write dvt ppx

## 2020-11-16 NOTE — Progress Notes (Signed)
PFT done today. 

## 2020-11-21 ENCOUNTER — Ambulatory Visit (HOSPITAL_COMMUNITY)
Admission: RE | Admit: 2020-11-21 | Discharge: 2020-11-21 | Disposition: A | Discharge status: Home / Self Care | Payer: Medicare Other | Source: Ambulatory Visit | Attending: Physician Assistant | Admitting: Physician Assistant

## 2020-11-21 ENCOUNTER — Other Ambulatory Visit: Payer: Self-pay

## 2020-11-21 ENCOUNTER — Other Ambulatory Visit (HOSPITAL_COMMUNITY): Admission: RE | Admit: 2020-11-21 | Payer: Medicare Other | Source: Ambulatory Visit

## 2020-11-21 ENCOUNTER — Encounter (HOSPITAL_COMMUNITY): Payer: Self-pay

## 2020-11-21 ENCOUNTER — Encounter (HOSPITAL_COMMUNITY)
Admission: RE | Admit: 2020-11-21 | Discharge: 2020-11-21 | Disposition: A | Discharge status: Home / Self Care | Payer: Medicare Other | Source: Ambulatory Visit | Attending: Orthopaedic Surgery | Admitting: Orthopaedic Surgery

## 2020-11-21 ENCOUNTER — Other Ambulatory Visit (HOSPITAL_COMMUNITY): Payer: Medicare Other

## 2020-11-21 DIAGNOSIS — Z7901 Long term (current) use of anticoagulants: Secondary | ICD-10-CM | POA: Diagnosis not present

## 2020-11-21 DIAGNOSIS — G4733 Obstructive sleep apnea (adult) (pediatric): Secondary | ICD-10-CM | POA: Diagnosis not present

## 2020-11-21 DIAGNOSIS — Z86718 Personal history of other venous thrombosis and embolism: Secondary | ICD-10-CM | POA: Diagnosis not present

## 2020-11-21 DIAGNOSIS — E1122 Type 2 diabetes mellitus with diabetic chronic kidney disease: Secondary | ICD-10-CM | POA: Diagnosis not present

## 2020-11-21 DIAGNOSIS — E785 Hyperlipidemia, unspecified: Secondary | ICD-10-CM | POA: Insufficient documentation

## 2020-11-21 DIAGNOSIS — Z01818 Encounter for other preprocedural examination: Secondary | ICD-10-CM | POA: Diagnosis present

## 2020-11-21 DIAGNOSIS — I480 Paroxysmal atrial fibrillation: Secondary | ICD-10-CM | POA: Insufficient documentation

## 2020-11-21 DIAGNOSIS — Z20822 Contact with and (suspected) exposure to covid-19: Secondary | ICD-10-CM | POA: Insufficient documentation

## 2020-11-21 DIAGNOSIS — N186 End stage renal disease: Secondary | ICD-10-CM | POA: Diagnosis not present

## 2020-11-21 DIAGNOSIS — M1612 Unilateral primary osteoarthritis, left hip: Secondary | ICD-10-CM | POA: Insufficient documentation

## 2020-11-21 DIAGNOSIS — Z79899 Other long term (current) drug therapy: Secondary | ICD-10-CM | POA: Diagnosis not present

## 2020-11-21 LAB — PROTIME-INR
INR: 1.2 (ref 0.8–1.2)
Prothrombin Time: 15.2 seconds (ref 11.4–15.2)

## 2020-11-21 LAB — COMPREHENSIVE METABOLIC PANEL
ALT: 17 U/L (ref 0–44)
AST: 20 U/L (ref 15–41)
Albumin: 4 g/dL (ref 3.5–5.0)
Alkaline Phosphatase: 91 U/L (ref 38–126)
Anion gap: 16 — ABNORMAL HIGH (ref 5–15)
BUN: 35 mg/dL — ABNORMAL HIGH (ref 8–23)
CO2: 30 mmol/L (ref 22–32)
Calcium: 9.5 mg/dL (ref 8.9–10.3)
Chloride: 95 mmol/L — ABNORMAL LOW (ref 98–111)
Creatinine, Ser: 9.76 mg/dL — ABNORMAL HIGH (ref 0.44–1.00)
GFR, Estimated: 4 mL/min — ABNORMAL LOW (ref >60)
Glucose, Bld: 84 mg/dL (ref 70–99)
Potassium: 4.3 mmol/L (ref 3.5–5.1)
Sodium: 141 mmol/L (ref 135–145)
Total Bilirubin: 0.6 mg/dL (ref 0.3–1.2)
Total Protein: 7.5 g/dL (ref 6.5–8.1)

## 2020-11-21 LAB — CBC WITH DIFFERENTIAL/PLATELET
Abs Immature Granulocytes: 0.03 10*3/uL (ref 0.00–0.07)
Basophils Absolute: 0 10*3/uL (ref 0.0–0.1)
Basophils Relative: 1 %
Eosinophils Absolute: 0.2 10*3/uL (ref 0.0–0.5)
Eosinophils Relative: 2 %
HCT: 37.7 % (ref 36.0–46.0)
Hemoglobin: 11.2 g/dL — ABNORMAL LOW (ref 12.0–15.0)
Immature Granulocytes: 0 %
Lymphocytes Relative: 19 %
Lymphs Abs: 1.7 10*3/uL (ref 0.7–4.0)
MCH: 29.8 pg (ref 26.0–34.0)
MCHC: 29.7 g/dL — ABNORMAL LOW (ref 30.0–36.0)
MCV: 100.3 fL — ABNORMAL HIGH (ref 80.0–100.0)
Monocytes Absolute: 0.9 10*3/uL (ref 0.1–1.0)
Monocytes Relative: 10 %
Neutro Abs: 6 10*3/uL (ref 1.7–7.7)
Neutrophils Relative %: 68 %
Platelets: 164 10*3/uL (ref 150–400)
RBC: 3.76 MIL/uL — ABNORMAL LOW (ref 3.87–5.11)
RDW: 15.8 % — ABNORMAL HIGH (ref 11.5–15.5)
WBC: 8.8 10*3/uL (ref 4.0–10.5)
nRBC: 0 % (ref 0.0–0.2)

## 2020-11-21 LAB — TYPE AND SCREEN
ABO/RH(D): A POS
Antibody Screen: NEGATIVE

## 2020-11-21 LAB — APTT: aPTT: 31 seconds (ref 24–36)

## 2020-11-21 LAB — SARS CORONAVIRUS 2 (TAT 6-24 HRS): SARS Coronavirus 2: NEGATIVE

## 2020-11-21 LAB — GLUCOSE, CAPILLARY: Glucose-Capillary: 68 mg/dL — ABNORMAL LOW (ref 70–99)

## 2020-11-21 LAB — SURGICAL PCR SCREEN
MRSA, PCR: NEGATIVE
Staphylococcus aureus: NEGATIVE

## 2020-11-21 LAB — HEMOGLOBIN A1C
Hgb A1c MFr Bld: 4.7 % — ABNORMAL LOW (ref 4.8–5.6)
Mean Plasma Glucose: 88.19 mg/dL

## 2020-11-21 NOTE — Progress Notes (Signed)
PCP - Kelton Pillar Cardiologist - [place]  Chest x-ray - 11-21-20 EKG - 08-17-20 ECHO - 02-04-20  SA - yes, does not wear CPAP  DM - Type 2 Fasting Blood Sugar - does not check sugars at home, only at dialysis   Blood Thinner Instructions: stopped Eliquis 11-20-20, per surgeon's instructions  ERAS Protcol - yes, water given  COVID TEST- 11-21-20   Anesthesia review: yes, EKG, heart history  Patient denies shortness of breath, fever, cough and chest pain at PAT appointment   All instructions explained to the patient, with a verbal understanding of the material. Patient agrees to go over the instructions while at home for a better understanding. Patient also instructed to self quarantine after being tested for COVID-19. The opportunity to ask questions was provided.

## 2020-11-21 NOTE — Progress Notes (Signed)
[id number] [address], [place] [address] Phone: [phone] Fax: [phone]      Your procedure is scheduled on Thursday, December 23rd.  Report to [facility] Main Entrance "A" at 10:30 A.M., and check in at the Admitting office.  Call this number if you have problems the morning of surgery:  [phone]  Call [phone] if you have any questions prior to your surgery date Monday-Friday 8am-4pm    Remember:  Do not eat after midnight the night before your surgery  You may drink clear liquids until 9:30 AM the morning of your surgery.   Clear liquids allowed are: Water, Non-Citrus Juices (without pulp), Carbonated Beverages, Clear Tea, Black Coffee Only, and Gatorade    Please finish your bottled water by 9:30 AM the morning of surgery.    Nothing else to drink after you finish the bottled water.    Take these medicines the morning of surgery with A SIP OF WATER   Tylenol - if needed  Benadryl - if needed  Gabapentin (Neurontin)  Zofran - if needed  Oxycodone - if needed for pain   Follow your surgeon's instructions on when to stop Eliquis.  If no instructions were given by your surgeon then you will need to call the office to get those instructions.    As of today, STOP taking any Aspirin (unless otherwise instructed by your surgeon) Aleve, Naproxen, Ibuprofen, Motrin, Advil, Goody's, BC's, all herbal medications, fish oil, and all vitamins.   HOW TO MANAGE YOUR DIABETES BEFORE AND AFTER SURGERY  Why is it important to control my blood sugar before and after surgery? . Improving blood sugar levels before and after surgery helps healing and can limit problems. . A way of improving blood sugar control is eating a healthy diet by: o  Eating less sugar and carbohydrates o  Increasing activity/exercise o  Talking with your doctor about reaching your blood sugar goals . High blood sugars (greater than 180  mg/dL) can raise your risk of infections and slow your recovery, so you will need to focus on controlling your diabetes during the weeks before surgery. . Make sure that the doctor who takes care of your diabetes knows about your planned surgery including the date and location.  How do I manage my blood sugar before surgery? . Check your blood sugar at least 4 times a day, starting 2 days before surgery, to make sure that the level is not too high or low. . Check your blood sugar the morning of your surgery when you wake up and every 2 hours until you get to the Short Stay unit. o If your blood sugar is less than 70 mg/dL, you will need to treat for low blood sugar: - Do not take insulin. - Treat a low blood sugar (less than 70 mg/dL) with  cup of clear juice (cranberry or apple), 4 glucose tablets, OR glucose gel. - Recheck blood sugar in 15 minutes after treatment (to make sure it is greater than 70 mg/dL). If your blood sugar is not greater than 70 mg/dL on recheck, call [phone] for further instructions. . Report your blood sugar to the short stay nurse when you get to Short Stay.  . If you are admitted to the hospital after surgery: o Your blood sugar will be checked by the staff and you will probably be given insulin after surgery (instead of oral diabetes medicines) to make sure you have good  blood sugar levels. o The goal for blood sugar control after surgery is 80-180 mg/dL.              DAY OF SURGERY:            Do not wear jewelry, make up, or nail polish            Do not wear lotions, powders, perfumes, or deodorant.            Do not shave 48 hours prior to surgery.              Do not bring valuables to the hospital.            [facility] is not responsible for any belongings or valuables.  Do NOT Smoke (Tobacco/Vaping) or drink Alcohol 24 hours prior to your procedure If you use a CPAP at night, you may bring all equipment for your overnight stay.   Contacts,  glasses, dentures or bridgework may not be worn into surgery.      For patients admitted to the hospital, discharge time will be determined by your treatment team.   Patients discharged the day of surgery will not be allowed to drive home, and someone needs to stay with them for 24 hours.    Special instructions:   [facility]- Preparing For Surgery  Before surgery, you can play an important role. Because skin is not sterile, your skin needs to be as free of germs as possible. You can reduce the number of germs on your skin by washing with CHG (chlorahexidine gluconate) Soap before surgery.  CHG is an antiseptic cleaner which kills germs and bonds with the skin to continue killing germs even after washing.    Oral Hygiene is also important to reduce your risk of infection.  Remember - BRUSH YOUR TEETH THE MORNING OF SURGERY WITH YOUR REGULAR TOOTHPASTE  Please do not use if you have an allergy to CHG or antibacterial soaps. If your skin becomes reddened/irritated stop using the CHG.  Do not shave (including legs and underarms) for at least 48 hours prior to first CHG shower. It is OK to shave your face.  Please follow these instructions carefully.   1. Shower the NIGHT BEFORE SURGERY and the MORNING OF SURGERY with CHG Soap.   2. If you chose to wash your hair, wash your hair first as usual with your normal shampoo.  3. After you shampoo, rinse your hair and body thoroughly to remove the shampoo.  4. Use CHG as you would any other liquid soap. You can apply CHG directly to the skin and wash gently with a scrungie or a clean washcloth.   5. Apply the CHG Soap to your body ONLY FROM THE NECK DOWN.  Do not use on open wounds or open sores. Avoid contact with your eyes, ears, mouth and genitals (private parts). Wash Face and genitals (private parts)  with your normal soap.   6. Wash thoroughly, paying special attention to the area where your surgery will be performed.  7. Thoroughly rinse  your body with warm water from the neck down.  8. DO NOT shower/wash with your normal soap after using and rinsing off the CHG Soap.  9. Pat yourself dry with a CLEAN TOWEL.  10. Wear CLEAN PAJAMAS to bed the night before surgery  11. Place CLEAN SHEETS on your bed the night of your first shower and DO NOT SLEEP WITH PETS.   Day of Surgery: Wear  Clean/Comfortable clothing the morning of surgery Do not apply any deodorants/lotions.   Remember to brush your teeth WITH YOUR REGULAR TOOTHPASTE.   Please read over the following fact sheets that you were given.

## 2020-11-22 ENCOUNTER — Encounter (HOSPITAL_COMMUNITY): Payer: Self-pay

## 2020-11-22 ENCOUNTER — Telehealth: Payer: Self-pay

## 2020-11-22 NOTE — Anesthesia Preprocedure Evaluation (Addendum)
Anesthesia Evaluation  Patient identified by MRN, date of birth, ID band Patient awake    Reviewed: Allergy & Precautions, NPO status , Patient's Chart, lab work & pertinent test results  Airway Mallampati: II  TM Distance: >3 FB Neck ROM: Full    Dental no notable dental hx. (+) Teeth Intact, Dental Advisory Given   Pulmonary asthma ,    Pulmonary exam normal breath sounds clear to auscultation       Cardiovascular Exercise Tolerance: Good hypertension, Pt. on medications + angina + DVT  negative cardio ROS Normal cardiovascular exam+ dysrhythmias Atrial Fibrillation  Rhythm:Regular Rate:Normal  02/04/2020 Echo 1. Left ventricular ejection fraction, by estimation, is 70 to 75%. The  left ventricle has hyperdynamic function. Left ventricular diastolic  parameters are indeterminate.  2. Right ventricular systolic function is normal. The right ventricular  size is normal. There is normal pulmonary artery systolic pressure.  3. The mitral valve is normal in structure and function. Trivial mitral  valve regurgitation   Neuro/Psych  Headaches, negative psych ROS   GI/Hepatic negative GI ROS, Neg liver ROS,   Endo/Other  diabetes, Well Controlled, Type 2  Renal/GU Dialysis and ESRFRenal diseaseMWF Dialysis Lab Results      Component                Value               Date                      CREATININE               8.90 (H)            11/23/2020                BUN                      36 (H)              11/23/2020                NA                       139                 11/23/2020                K                        3.8                 11/23/2020                CL                       98                  11/23/2020                CO2                      30                  11/21/2020                Musculoskeletal  (+) Arthritis ,   Abdominal   Peds  Hematology  (+)  anemia , Lab Results      Component                 Value               Date                      WBC                      8.8                 11/21/2020                HGB                      13.3                11/23/2020                HCT                      39.0                11/23/2020                MCV                      100.3 (H)           11/21/2020                PLT                      164                 11/21/2020              Anesthesia Other Findings   Reproductive/Obstetrics                           Anesthesia Physical Anesthesia Plan  ASA: III  Anesthesia Plan: General   Post-op Pain Management:    Induction: Intravenous  PONV Risk Score and Plan: 4 or greater and Treatment may vary due to age or medical condition, Ondansetron and Dexamethasone  Airway Management Planned: Oral ETT  Additional Equipment: None  Intra-op Plan:   Post-operative Plan: Extubation in OR  Informed Consent: I have reviewed the patients History and Physical, chart, labs and discussed the procedure including the risks, benefits and alternatives for the proposed anesthesia with the patient or authorized representative who has indicated his/her understanding and acceptance.     Dental advisory given  Plan Discussed with: CRNA and Anesthesiologist  Anesthesia Plan Comments: (PAT note written 11/22/2020 by Myra Gianotti, PA-C. )       Anesthesia Quick Evaluation

## 2020-11-22 NOTE — Progress Notes (Signed)
Anesthesia Chart Review:  Case: [id number] Date/Time: 11/23/20 1215   Procedure: LEFT TOTAL HIP ARTHROPLASTY ANTERIOR APPROACH (Left Hip) - NEEDS RNFA PLEASE   Anesthesia type: Spinal   Pre-op diagnosis: left hip degenerative joint disease   Location: [facility] OR ROOM 06 / [place] OR   Surgeons: Leandrew Koyanagi, MD      DISCUSSION: Patient is a 64 year old Arnold scheduled for the above procedure.  History includes never smoker, HLD, dyslipidemia, PAF (diagnosed 2010), pericarditis with pericardial effusion (s/p right VATS/pericardial window 08/31/19), DVT (RLE DVT 11/03/18, post right THA), DM2 (diet controlled), ESRD (started 02/13/17; left radiocephalic AVF, s/p revision 01/13/20), PCOS, anemia, secondary hyperparathyroidism, OSA (not using CPAP; s/p uvuloplasty), asthma, epistaxis (hospitalization 11/2012), obesity (s/p panniculectomy), THR (right 09/15/18).  Preoperative cardiology input outlined by Sande Rives, PA-C on 10/24/20, "...Given past medical history and time since last visit, based on ACC/AHA guidelines, RANDY WHITENER would be at acceptable risk for the planned procedure without further cardiovascular testing.   Per Dr. Heron Nay and Pharmacy, patient can hold Eliquis for 3 days prior to procedure. This should be restarted as soon as able postoperatively." Reported last Eliquis 11/20/2020.  11/21/2020 presurgical COVID-19 test negative.  Anesthesia team to evaluate on the day of surgery.  She will need i-STAT labs on arrival.   VS: BP 105/65   Pulse (!) 101   Temp 36.7 C (Oral)   Resp 19   Ht 5\' 6"  (1.676 m)   Wt 108.8 kg   SpO2 100%   BMI Brenda.72 kg/m    PROVIDERS: Kelton Pillar, MD is PCP Fransico Him, MD is cardiologist   LABS: Labs reviewed: Acceptable for surgery.  She is for i-STAT on the day of surgery given end-stage renal disease. (all labs ordered are listed, but only abnormal results are displayed)  Labs Reviewed  GLUCOSE, CAPILLARY - Abnormal; Notable for the  following components:      Result Value   Glucose-Capillary 68 (*)    All other components within normal limits  CBC WITH DIFFERENTIAL/PLATELET - Abnormal; Notable for the following components:   RBC 3.76 (*)    Hemoglobin 11.2 (*)    MCV 100.3 (*)    MCHC 29.7 (*)    RDW 15.8 (*)    All other components within normal limits  COMPREHENSIVE METABOLIC PANEL - Abnormal; Notable for the following components:   Chloride 95 (*)    BUN 35 (*)    Creatinine, Ser 9.76 (*)    GFR, Estimated 4 (*)    Anion gap 16 (*)    All other components within normal limits  HEMOGLOBIN A1C - Abnormal; Notable for the following components:   Hgb A1c MFr Bld 4.7 (*)    All other components within normal limits  SURGICAL PCR SCREEN  SARS CORONAVIRUS 2 (TAT 6-24 HRS)  PROTIME-INR  APTT  TYPE AND SCREEN    PFTs 11/16/20: FVC 2.41 (85%), post 2.32 (82%). FEV1 1.95 (88%), post 1.94 (88%). DLCO unc/cor 16.99 (79%).   IMAGES: CXR 11/21/20: FINDINGS: Hazy bibasilar opacities, likely atelectasis. No pneumothorax or pleural effusion. Cardiomediastinal silhouette is within normal limits. Multilevel spondylosis. IMPRESSION: No focal airspace disease.  Bibasilar atelectasis.   EKG: 08/17/20 ([facility]):  NSR RBBB Anterior infarct, old Inferior infarct, old   CV: Echo 02/04/2020: IMPRESSIONS  1. Left ventricular ejection fraction, by estimation, is 70 to 75%. The  left ventricle has hyperdynamic function. Left ventricular diastolic  parameters are indeterminate.  2. Right ventricular systolic function is normal.  The right ventricular  size is normal. There is normal pulmonary artery systolic pressure.  3. The mitral valve is normal in structure and function. Trivial mitral  valve regurgitation.  4. The aortic valve is abnormal. Mild to moderate aortic valve  sclerosis/calcification is present, without any evidence of aortic  stenosis.  5. The inferior vena cava is normal in size with  greater than 50%  respiratory variability, suggesting right atrial pressure of 3 mmHg.    Myocardial perfusion scan 04/17/2017:  Nuclear stress EF: 68%.  There was no ST segment deviation noted during stress.  The study is normal.  This is a low risk study.  The left ventricular ejection fraction is hyperdynamic (>65%). Normal pharmacologic nuclear study with no evidence of prior infarct or ischemia.    Past Medical History:  Diagnosis Date  . Anemia   . Arthritis   . Asthma   . Complication of anesthesia    difficulty with getting oxygen saturation up- "patient was not aware"  Bottom of feet burning in PACU  . Constipation    because of Iron  . Diabetes mellitus    not on meds  . DVT (deep venous thrombosis) ([place]) 2019   post hip replacement - right  . Dyslipidemia   . Epistaxis 11/05/2012  . ESRD (end stage renal disease) on dialysis [facility])    "MWF; [facility]" (09/15/2018)- started 02/13/2017  . History of blood transfusion   . HTN (hypertension)   . Hyperparathyroidism due to renal insufficiency ([place])   . Obesity    s/p panniculectomy  . Paroxysmal A-fib ([facility])    a. chronic coumadin;  b. 12/2009 Echo: EF 60-65%, Gr 1 DD.  Marland Kitchen PCOS (polycystic ovarian syndrome)   . Pericarditis 08/2019  . Pneumonia   . Seasonal allergies   . Sleep apnea    a. not using CPAP, last study  >8 yrs  . Vitamin D deficiency     Past Surgical History:  Procedure Laterality Date  . ABDOMINAL HYSTERECTOMY     with panniculctomy  . AV FISTULA PLACEMENT  09/02/2012   Procedure: ARTERIOVENOUS (AV) FISTULA CREATION;  Surgeon: Elam Dutch, MD;  Location: [facility] OR;  Service: Vascular;  Laterality: Left;  Creation of Left Radial-Cephalic Fistula   . BREAST SURGERY     Biopsy right breast  . COLONOSCOPY    . COLONOSCOPY W/ BIOPSIES AND POLYPECTOMY    . DILATION AND CURETTAGE OF UTERUS    . KNEE ARTHROSCOPY Right   . PANNICULECTOMY    . REVISON OF ARTERIOVENOUS FISTULA Left 04/27/2014    Procedure: REVISON OF LEFT RADIAL-CEPHALIC ARTERIOVENOUS FISTULA;  Surgeon: Mal Misty, MD;  Location: [place];  Service: Vascular;  Laterality: Left;  . REVISON OF ARTERIOVENOUS FISTULA Left 01/13/2020   Procedure: REVISON OF ARTERIOVENOUS FISTULA;  Surgeon: Marty Heck, MD;  Location: [place];  Service: Vascular;  Laterality: Left;  . TOTAL HIP ARTHROPLASTY Right 09/15/2018  . TOTAL HIP ARTHROPLASTY Right 09/15/2018   Procedure: RIGHT TOTAL HIP ARTHROPLASTY ANTERIOR APPROACH;  Surgeon: Leandrew Koyanagi, MD;  Location: [place];  Service: Orthopedics;  Laterality: Right;  . UVULOPLASTY    . VIDEO ASSISTED THORACOSCOPY (VATS)/WEDGE RESECTION Right 08/31/2019   Procedure: VIDEO ASSISTED THORACOSCOPY/ DRAINAGE OF PERICARDIAL EFFUSION/ PERICARDIAL WINDOW/ ABORTED PERICARDIOCENTESIS ;  Surgeon: Lajuana Matte, MD;  Location: [place];  Service: Thoracic;  Laterality: Right;    MEDICATIONS: . acetaminophen (TYLENOL) 650 MG CR tablet  . B Complex-C-Folic Acid (DIALYVITE TABLET)  TABS  . bisacodyl (DULCOLAX) 5 MG EC tablet  . calcitRIOL (ROCALTROL) 0.25 MCG capsule  . cephALEXin (KEFLEX) 500 MG capsule  . colchicine 0.6 MG tablet  . dextrose 50 % solution  . diphenhydrAMINE (BENADRYL) 25 MG tablet  . docusate sodium (COLACE) 100 MG capsule  . ELIQUIS 5 MG TABS tablet  . ethyl chloride spray  . gabapentin (NEURONTIN) 100 MG capsule  . Menthol-Methyl Salicylate (MUSCLE RUB) 10-15 % CREA  . methocarbamol (ROBAXIN) 500 MG tablet  . Methoxy PEG-Epoetin Beta (MIRCERA IJ)  . midodrine (PROAMATINE) 10 MG tablet  . ondansetron (ZOFRAN) 4 MG tablet  . oxyCODONE (ROXICODONE) 5 MG immediate release tablet  . oxyCODONE-acetaminophen (PERCOCET) 5-325 MG tablet  . sucroferric oxyhydroxide (VELPHORO) 500 MG chewable tablet   No current facility-administered medications for this encounter.    Myra Gianotti, PA-C Surgical Short Stay/Anesthesiology [facility] Phone [phone] [facility] Phone [phone] 11/22/2020 12:49 PM

## 2020-11-22 NOTE — Telephone Encounter (Signed)
-----   Message from Sueanne Margarita, MD sent at 11/22/2020 11:44 AM EST ----- If she has further palpitations she needs to let us know so she can restart Amio - I refilled it at last OV

## 2020-11-22 NOTE — Telephone Encounter (Signed)
The patient understands to call if palpitations recur and amio will be restarted. She was grateful for call and agrees with plan.   Med list updated.

## 2020-11-23 ENCOUNTER — Ambulatory Visit (HOSPITAL_COMMUNITY): Payer: Medicare Other | Admitting: Vascular Surgery

## 2020-11-23 ENCOUNTER — Observation Stay (HOSPITAL_COMMUNITY): Payer: Medicare Other

## 2020-11-23 ENCOUNTER — Inpatient Hospital Stay (HOSPITAL_COMMUNITY)
Admission: RE | Admit: 2020-11-23 | Discharge: 2020-11-30 | DRG: 469 | Disposition: A | Discharge status: Skilled Nursing Facility | Payer: Medicare Other | Attending: Orthopaedic Surgery | Admitting: Orthopaedic Surgery

## 2020-11-23 ENCOUNTER — Encounter (HOSPITAL_COMMUNITY)
Admission: RE | Disposition: A | Discharge status: Skilled Nursing Facility | Payer: Self-pay | Source: Home / Self Care | Attending: Orthopaedic Surgery

## 2020-11-23 ENCOUNTER — Ambulatory Visit (HOSPITAL_COMMUNITY): Payer: Medicare Other | Admitting: Anesthesiology

## 2020-11-23 ENCOUNTER — Ambulatory Visit (HOSPITAL_COMMUNITY): Payer: Medicare Other

## 2020-11-23 ENCOUNTER — Other Ambulatory Visit: Payer: Self-pay

## 2020-11-23 ENCOUNTER — Encounter (HOSPITAL_COMMUNITY): Payer: Self-pay | Admitting: Orthopaedic Surgery

## 2020-11-23 DIAGNOSIS — M1612 Unilateral primary osteoarthritis, left hip: Principal | ICD-10-CM | POA: Diagnosis present

## 2020-11-23 DIAGNOSIS — Z96649 Presence of unspecified artificial hip joint: Secondary | ICD-10-CM

## 2020-11-23 DIAGNOSIS — Z8701 Personal history of pneumonia (recurrent): Secondary | ICD-10-CM

## 2020-11-23 DIAGNOSIS — D62 Acute posthemorrhagic anemia: Secondary | ICD-10-CM | POA: Diagnosis not present

## 2020-11-23 DIAGNOSIS — Z86718 Personal history of other venous thrombosis and embolism: Secondary | ICD-10-CM

## 2020-11-23 DIAGNOSIS — Z992 Dependence on renal dialysis: Secondary | ICD-10-CM

## 2020-11-23 DIAGNOSIS — E669 Obesity, unspecified: Secondary | ICD-10-CM | POA: Diagnosis present

## 2020-11-23 DIAGNOSIS — Z888 Allergy status to other drugs, medicaments and biological substances status: Secondary | ICD-10-CM

## 2020-11-23 DIAGNOSIS — E785 Hyperlipidemia, unspecified: Secondary | ICD-10-CM | POA: Diagnosis present

## 2020-11-23 DIAGNOSIS — I120 Hypertensive chronic kidney disease with stage 5 chronic kidney disease or end stage renal disease: Secondary | ICD-10-CM | POA: Diagnosis present

## 2020-11-23 DIAGNOSIS — Z20822 Contact with and (suspected) exposure to covid-19: Secondary | ICD-10-CM | POA: Diagnosis present

## 2020-11-23 DIAGNOSIS — I480 Paroxysmal atrial fibrillation: Secondary | ICD-10-CM | POA: Diagnosis present

## 2020-11-23 DIAGNOSIS — Z419 Encounter for procedure for purposes other than remedying health state, unspecified: Secondary | ICD-10-CM

## 2020-11-23 DIAGNOSIS — Z8249 Family history of ischemic heart disease and other diseases of the circulatory system: Secondary | ICD-10-CM

## 2020-11-23 DIAGNOSIS — Z841 Family history of disorders of kidney and ureter: Secondary | ICD-10-CM

## 2020-11-23 DIAGNOSIS — Z96641 Presence of right artificial hip joint: Secondary | ICD-10-CM | POA: Diagnosis present

## 2020-11-23 DIAGNOSIS — Z90710 Acquired absence of both cervix and uterus: Secondary | ICD-10-CM

## 2020-11-23 DIAGNOSIS — Z91013 Allergy to seafood: Secondary | ICD-10-CM

## 2020-11-23 DIAGNOSIS — Z87892 Personal history of anaphylaxis: Secondary | ICD-10-CM

## 2020-11-23 DIAGNOSIS — N186 End stage renal disease: Secondary | ICD-10-CM | POA: Diagnosis present

## 2020-11-23 DIAGNOSIS — N2581 Secondary hyperparathyroidism of renal origin: Secondary | ICD-10-CM | POA: Diagnosis present

## 2020-11-23 DIAGNOSIS — G4730 Sleep apnea, unspecified: Secondary | ICD-10-CM | POA: Diagnosis present

## 2020-11-23 DIAGNOSIS — E1122 Type 2 diabetes mellitus with diabetic chronic kidney disease: Secondary | ICD-10-CM | POA: Diagnosis present

## 2020-11-23 DIAGNOSIS — Z7901 Long term (current) use of anticoagulants: Secondary | ICD-10-CM

## 2020-11-23 DIAGNOSIS — Z79899 Other long term (current) drug therapy: Secondary | ICD-10-CM

## 2020-11-23 DIAGNOSIS — J45909 Unspecified asthma, uncomplicated: Secondary | ICD-10-CM | POA: Diagnosis present

## 2020-11-23 DIAGNOSIS — I319 Disease of pericardium, unspecified: Secondary | ICD-10-CM | POA: Diagnosis present

## 2020-11-23 DIAGNOSIS — Z833 Family history of diabetes mellitus: Secondary | ICD-10-CM

## 2020-11-23 DIAGNOSIS — Z6838 Body mass index (BMI) 38.0-38.9, adult: Secondary | ICD-10-CM

## 2020-11-23 DIAGNOSIS — Z96642 Presence of left artificial hip joint: Secondary | ICD-10-CM | POA: Diagnosis present

## 2020-11-23 DIAGNOSIS — I9589 Other hypotension: Secondary | ICD-10-CM | POA: Diagnosis present

## 2020-11-23 HISTORY — PX: TOTAL HIP ARTHROPLASTY: SHX124

## 2020-11-23 LAB — CBC
HCT: 32.5 % — ABNORMAL LOW (ref 36.0–46.0)
Hemoglobin: 10.5 g/dL — ABNORMAL LOW (ref 12.0–15.0)
MCH: 31.5 pg (ref 26.0–34.0)
MCHC: 32.3 g/dL (ref 30.0–36.0)
MCV: 97.6 fL (ref 80.0–100.0)
Platelets: 124 10*3/uL — ABNORMAL LOW (ref 150–400)
RBC: 3.33 MIL/uL — ABNORMAL LOW (ref 3.87–5.11)
RDW: 15.4 % (ref 11.5–15.5)
WBC: 13.1 10*3/uL — ABNORMAL HIGH (ref 4.0–10.5)
nRBC: 0 % (ref 0.0–0.2)

## 2020-11-23 LAB — RENAL FUNCTION PANEL
Albumin: 4.2 g/dL (ref 3.5–5.0)
Anion gap: 14 (ref 5–15)
BUN: 36 mg/dL — ABNORMAL HIGH (ref 8–23)
CO2: 25 mmol/L (ref 22–32)
Calcium: 9.1 mg/dL (ref 8.9–10.3)
Chloride: 100 mmol/L (ref 98–111)
Creatinine, Ser: 9.12 mg/dL — ABNORMAL HIGH (ref 0.44–1.00)
GFR, Estimated: 4 mL/min — ABNORMAL LOW (ref >60)
Glucose, Bld: 134 mg/dL — ABNORMAL HIGH (ref 70–99)
Phosphorus: 5.4 mg/dL — ABNORMAL HIGH (ref 2.5–4.6)
Potassium: 3.9 mmol/L (ref 3.5–5.1)
Sodium: 139 mmol/L (ref 135–145)

## 2020-11-23 LAB — POCT I-STAT, CHEM 8
BUN: 36 mg/dL — ABNORMAL HIGH (ref 8–23)
Calcium, Ion: 1.05 mmol/L — ABNORMAL LOW (ref 1.15–1.40)
Chloride: 98 mmol/L (ref 98–111)
Creatinine, Ser: 8.9 mg/dL — ABNORMAL HIGH (ref 0.44–1.00)
Glucose, Bld: 73 mg/dL (ref 70–99)
HCT: 39 % (ref 36.0–46.0)
Hemoglobin: 13.3 g/dL (ref 12.0–15.0)
Potassium: 3.8 mmol/L (ref 3.5–5.1)
Sodium: 139 mmol/L (ref 135–145)
TCO2: 28 mmol/L (ref 22–32)

## 2020-11-23 LAB — GLUCOSE, CAPILLARY
Glucose-Capillary: 73 mg/dL (ref 70–99)
Glucose-Capillary: 60 mg/dL — ABNORMAL LOW (ref 70–99)
Glucose-Capillary: 102 mg/dL — ABNORMAL HIGH (ref 70–99)
Glucose-Capillary: 117 mg/dL — ABNORMAL HIGH (ref 70–99)

## 2020-11-23 SURGERY — ARTHROPLASTY, HIP, TOTAL, ANTERIOR APPROACH
Anesthesia: General | Site: Hip | Laterality: Left

## 2020-11-23 MED ORDER — FENTANYL CITRATE (PF) 100 MCG/2ML IJ SOLN
25.0000 ug | INTRAMUSCULAR | Status: DC | PRN
  Administered 2020-11-23: 25 ug via INTRAVENOUS
  Administered 2020-11-23: 50 ug via INTRAVENOUS

## 2020-11-23 MED ORDER — CEPHALEXIN 250 MG PO CAPS: 250.0000 mg | ORAL_CAPSULE | Freq: Two times a day (BID) | ORAL | 0 refills | Status: DC

## 2020-11-23 MED ORDER — VASOPRESSIN 20 UNIT/ML IV SOLN
INTRAVENOUS | Status: AC
  Filled 2020-11-23: qty 1

## 2020-11-23 MED ORDER — ORAL CARE MOUTH RINSE: 15.0000 mL | Freq: Once | OROMUCOSAL | Status: AC

## 2020-11-23 MED ORDER — DEXAMETHASONE SODIUM PHOSPHATE 10 MG/ML IJ SOLN
10.0000 mg | Freq: Once | INTRAMUSCULAR | Status: AC
  Administered 2020-11-24: 10 mg via INTRAVENOUS
  Filled 2020-11-23: qty 1

## 2020-11-23 MED ORDER — CHLORHEXIDINE GLUCONATE CLOTH 2 % EX PADS
6.0000 | MEDICATED_PAD | Freq: Every day | CUTANEOUS | Status: DC
  Administered 2020-11-24 – 2020-11-28 (×4): 6 via TOPICAL

## 2020-11-23 MED ORDER — ONDANSETRON HCL 4 MG/2ML IJ SOLN
INTRAMUSCULAR | Status: DC | PRN
  Administered 2020-11-23: 4 mg via INTRAVENOUS

## 2020-11-23 MED ORDER — SORBITOL 70 % SOLN
30.0000 mL | Freq: Every day | Status: DC | PRN
  Administered 2020-11-26: 30 mL via ORAL
  Filled 2020-11-23 (×3): qty 30

## 2020-11-23 MED ORDER — VANCOMYCIN HCL 1000 MG IV SOLR
INTRAVENOUS | Status: AC
  Filled 2020-11-23: qty 1000

## 2020-11-23 MED ORDER — LIDOCAINE 2% (20 MG/ML) 5 ML SYRINGE
INTRAMUSCULAR | Status: DC | PRN
  Administered 2020-11-23: 60 mg via INTRAVENOUS

## 2020-11-23 MED ORDER — ROCURONIUM BROMIDE 10 MG/ML (PF) SYRINGE
PREFILLED_SYRINGE | INTRAVENOUS | Status: DC | PRN
  Administered 2020-11-23: 60 mg via INTRAVENOUS
  Administered 2020-11-23: 20 mg via INTRAVENOUS

## 2020-11-23 MED ORDER — POLYETHYLENE GLYCOL 3350 17 G PO PACK
17.0000 g | PACK | Freq: Every day | ORAL | Status: DC
  Administered 2020-11-25 – 2020-11-28 (×2): 17 g via ORAL
  Filled 2020-11-23 (×6): qty 1

## 2020-11-23 MED ORDER — PROPOFOL 10 MG/ML IV BOLUS
INTRAVENOUS | Status: AC
  Filled 2020-11-23: qty 20

## 2020-11-23 MED ORDER — LACTATED RINGERS IV SOLN: INTRAVENOUS | Status: DC

## 2020-11-23 MED ORDER — HEPARIN SODIUM (PORCINE) 1000 UNIT/ML DIALYSIS
1000.0000 [IU] | INTRAMUSCULAR | Status: DC | PRN
  Filled 2020-11-23: qty 1

## 2020-11-23 MED ORDER — CALCITRIOL 0.5 MCG PO CAPS
0.7500 ug | ORAL_CAPSULE | ORAL | Status: DC
  Filled 2020-11-23: qty 1

## 2020-11-23 MED ORDER — APIXABAN 5 MG PO TABS
5.0000 mg | ORAL_TABLET | Freq: Two times a day (BID) | ORAL | Status: DC
  Administered 2020-11-24 – 2020-11-29 (×12): 5 mg via ORAL
  Filled 2020-11-23 (×11): qty 1

## 2020-11-23 MED ORDER — FENTANYL CITRATE (PF) 250 MCG/5ML IJ SOLN
INTRAMUSCULAR | Status: AC
  Filled 2020-11-23: qty 5

## 2020-11-23 MED ORDER — METOCLOPRAMIDE HCL 5 MG/ML IJ SOLN: 5.0000 mg | Freq: Three times a day (TID) | INTRAMUSCULAR | Status: DC | PRN

## 2020-11-23 MED ORDER — DIPHENHYDRAMINE HCL 12.5 MG/5ML PO ELIX
25.0000 mg | ORAL_SOLUTION | ORAL | Status: DC | PRN
  Administered 2020-11-23: 25 mg via ORAL
  Filled 2020-11-23: qty 10

## 2020-11-23 MED ORDER — ONDANSETRON HCL 4 MG/2ML IJ SOLN: 4.0000 mg | Freq: Four times a day (QID) | INTRAMUSCULAR | Status: DC | PRN

## 2020-11-23 MED ORDER — LIDOCAINE-PRILOCAINE 2.5-2.5 % EX CREA
1.0000 "application " | TOPICAL_CREAM | CUTANEOUS | Status: DC | PRN
  Filled 2020-11-23: qty 5

## 2020-11-23 MED ORDER — PHENYLEPHRINE HCL-NACL 10-0.9 MG/250ML-% IV SOLN
INTRAVENOUS | Status: DC | PRN
  Administered 2020-11-23: 50 ug/min via INTRAVENOUS

## 2020-11-23 MED ORDER — DOCUSATE SODIUM 100 MG PO CAPS
100.0000 mg | ORAL_CAPSULE | Freq: Two times a day (BID) | ORAL | Status: DC
  Administered 2020-11-23 – 2020-11-29 (×13): 100 mg via ORAL
  Filled 2020-11-23 (×13): qty 1

## 2020-11-23 MED ORDER — DEXAMETHASONE SODIUM PHOSPHATE 10 MG/ML IJ SOLN
INTRAMUSCULAR | Status: AC
  Filled 2020-11-23: qty 1

## 2020-11-23 MED ORDER — TRANEXAMIC ACID-NACL 1000-0.7 MG/100ML-% IV SOLN
1000.0000 mg | Freq: Once | INTRAVENOUS | Status: AC
  Administered 2020-11-23: 1000 mg via INTRAVENOUS
  Filled 2020-11-23: qty 100

## 2020-11-23 MED ORDER — EPHEDRINE SULFATE-NACL 50-0.9 MG/10ML-% IV SOSY
PREFILLED_SYRINGE | INTRAVENOUS | Status: DC | PRN
  Administered 2020-11-23 (×2): 10 mg via INTRAVENOUS

## 2020-11-23 MED ORDER — SUCROFERRIC OXYHYDROXIDE 500 MG PO CHEW
500.0000 mg | CHEWABLE_TABLET | ORAL | Status: DC
  Administered 2020-11-23 – 2020-11-29 (×9): 500 mg via ORAL
  Filled 2020-11-23 (×19): qty 1

## 2020-11-23 MED ORDER — TRANEXAMIC ACID-NACL 1000-0.7 MG/100ML-% IV SOLN
1000.0000 mg | INTRAVENOUS | Status: AC
  Administered 2020-11-23: 1000 mg via INTRAVENOUS
  Filled 2020-11-23: qty 100

## 2020-11-23 MED ORDER — ALUM & MAG HYDROXIDE-SIMETH 200-200-20 MG/5ML PO SUSP: 30.0000 mL | ORAL | Status: DC | PRN

## 2020-11-23 MED ORDER — OXYCODONE HCL ER 10 MG PO T12A
10.0000 mg | EXTENDED_RELEASE_TABLET | Freq: Two times a day (BID) | ORAL | Status: DC
  Administered 2020-11-23 – 2020-11-25 (×5): 10 mg via ORAL
  Filled 2020-11-23 (×6): qty 1

## 2020-11-23 MED ORDER — SUCROFERRIC OXYHYDROXIDE 500 MG PO CHEW
1000.0000 mg | CHEWABLE_TABLET | Freq: Three times a day (TID) | ORAL | Status: DC
  Administered 2020-11-24 – 2020-11-29 (×15): 1000 mg via ORAL
  Filled 2020-11-23 (×20): qty 2

## 2020-11-23 MED ORDER — PROPOFOL 10 MG/ML IV BOLUS
INTRAVENOUS | Status: DC | PRN
  Administered 2020-11-23: 150 mg via INTRAVENOUS

## 2020-11-23 MED ORDER — BUPIVACAINE-EPINEPHRINE 0.25% -1:200000 IJ SOLN
INTRAMUSCULAR | Status: DC | PRN
  Administered 2020-11-23: 20 mL

## 2020-11-23 MED ORDER — TRANEXAMIC ACID 1000 MG/10ML IV SOLN
INTRAVENOUS | Status: DC | PRN
  Administered 2020-11-23: 2000 mg via TOPICAL

## 2020-11-23 MED ORDER — MIDODRINE HCL 5 MG PO TABS
10.0000 mg | ORAL_TABLET | Freq: Three times a day (TID) | ORAL | Status: DC
  Administered 2020-11-24 – 2020-11-29 (×18): 10 mg via ORAL
  Filled 2020-11-23 (×16): qty 2

## 2020-11-23 MED ORDER — SODIUM CHLORIDE 0.9 % IV SOLN
INTRAVENOUS | Status: DC | PRN
  Administered 2020-11-23: 20 mL

## 2020-11-23 MED ORDER — CHLORHEXIDINE GLUCONATE 0.12 % MT SOLN
OROMUCOSAL | Status: AC
  Administered 2020-11-23: 15 mL via OROMUCOSAL
  Filled 2020-11-23: qty 15

## 2020-11-23 MED ORDER — DEXTROSE 50 % IV SOLN: 12.5000 g | INTRAVENOUS | Status: AC

## 2020-11-23 MED ORDER — ONDANSETRON HCL 4 MG/2ML IJ SOLN
INTRAMUSCULAR | Status: AC
  Filled 2020-11-23: qty 2

## 2020-11-23 MED ORDER — PHENYLEPHRINE 40 MCG/ML (10ML) SYRINGE FOR IV PUSH (FOR BLOOD PRESSURE SUPPORT)
PREFILLED_SYRINGE | INTRAVENOUS | Status: AC
  Filled 2020-11-23: qty 10

## 2020-11-23 MED ORDER — VASOPRESSIN 20 UNIT/ML IV SOLN
INTRAVENOUS | Status: DC | PRN
  Administered 2020-11-23 (×2): 1 [IU] via INTRAVENOUS

## 2020-11-23 MED ORDER — BUPIVACAINE-EPINEPHRINE (PF) 0.25% -1:200000 IJ SOLN
INTRAMUSCULAR | Status: AC
  Filled 2020-11-23: qty 20

## 2020-11-23 MED ORDER — HYDROMORPHONE HCL 1 MG/ML IJ SOLN
0.5000 mg | INTRAMUSCULAR | Status: DC | PRN
  Administered 2020-11-23: 1 mg via INTRAVENOUS
  Filled 2020-11-23: qty 1

## 2020-11-23 MED ORDER — BUPIVACAINE LIPOSOME 1.3 % IJ SUSP
10.0000 mL | Freq: Once | INTRAMUSCULAR | Status: DC
  Filled 2020-11-23: qty 10

## 2020-11-23 MED ORDER — ACETAMINOPHEN 500 MG PO TABS
1000.0000 mg | ORAL_TABLET | Freq: Four times a day (QID) | ORAL | Status: AC
  Administered 2020-11-23 – 2020-11-24 (×4): 1000 mg via ORAL
  Filled 2020-11-23 (×4): qty 2

## 2020-11-23 MED ORDER — ALBUMIN HUMAN 5 % IV SOLN: INTRAVENOUS | Status: DC | PRN

## 2020-11-23 MED ORDER — DEXAMETHASONE SODIUM PHOSPHATE 10 MG/ML IJ SOLN
INTRAMUSCULAR | Status: DC | PRN
  Administered 2020-11-23: 10 mg via INTRAVENOUS

## 2020-11-23 MED ORDER — METHOCARBAMOL 500 MG PO TABS
500.0000 mg | ORAL_TABLET | Freq: Four times a day (QID) | ORAL | Status: DC | PRN
  Administered 2020-11-23 – 2020-11-26 (×4): 500 mg via ORAL
  Filled 2020-11-23 (×4): qty 1

## 2020-11-23 MED ORDER — SODIUM CHLORIDE 0.9 % IV SOLN: 100.0000 mL | INTRAVENOUS | Status: DC | PRN

## 2020-11-23 MED ORDER — ACETAMINOPHEN 325 MG PO TABS: 325.0000 mg | ORAL_TABLET | Freq: Four times a day (QID) | ORAL | Status: DC | PRN

## 2020-11-23 MED ORDER — OXYCODONE HCL 5 MG PO TABS
5.0000 mg | ORAL_TABLET | ORAL | Status: DC | PRN
  Administered 2020-11-23: 10 mg via ORAL
  Administered 2020-11-25 (×2): 5 mg via ORAL
  Filled 2020-11-23: qty 2
  Filled 2020-11-23 (×2): qty 1

## 2020-11-23 MED ORDER — SODIUM CHLORIDE 0.9 % IV SOLN: INTRAVENOUS | Status: DC

## 2020-11-23 MED ORDER — LIDOCAINE 2% (20 MG/ML) 5 ML SYRINGE
INTRAMUSCULAR | Status: AC
  Filled 2020-11-23: qty 5

## 2020-11-23 MED ORDER — METHOCARBAMOL 1000 MG/10ML IJ SOLN
500.0000 mg | Freq: Four times a day (QID) | INTRAVENOUS | Status: DC | PRN
  Filled 2020-11-23: qty 5

## 2020-11-23 MED ORDER — COLCHICINE 0.3 MG HALF TABLET
0.3000 mg | ORAL_TABLET | ORAL | Status: DC
  Administered 2020-11-24 – 2020-11-29 (×3): 0.3 mg via ORAL
  Filled 2020-11-23 (×3): qty 0.5
  Filled 2020-11-23: qty 1
  Filled 2020-11-23: qty 0.5

## 2020-11-23 MED ORDER — MIDAZOLAM HCL 2 MG/2ML IJ SOLN
INTRAMUSCULAR | Status: DC | PRN
  Administered 2020-11-23: 2 mg via INTRAVENOUS

## 2020-11-23 MED ORDER — SODIUM CHLORIDE 0.9 % IR SOLN
Status: DC | PRN
  Administered 2020-11-23: 3000 mL

## 2020-11-23 MED ORDER — MAGNESIUM CITRATE PO SOLN: 1.0000 | Freq: Once | ORAL | Status: DC | PRN

## 2020-11-23 MED ORDER — BUPIVACAINE LIPOSOME 1.3 % IJ SUSP
20.0000 mL | Freq: Once | INTRAMUSCULAR | Status: DC
  Filled 2020-11-23: qty 20

## 2020-11-23 MED ORDER — CHLORHEXIDINE GLUCONATE 0.12 % MT SOLN: 15.0000 mL | Freq: Once | OROMUCOSAL | Status: AC

## 2020-11-23 MED ORDER — ROCURONIUM BROMIDE 10 MG/ML (PF) SYRINGE
PREFILLED_SYRINGE | INTRAVENOUS | Status: AC
  Filled 2020-11-23: qty 10

## 2020-11-23 MED ORDER — GABAPENTIN 100 MG PO CAPS
100.0000 mg | ORAL_CAPSULE | Freq: Every day | ORAL | Status: DC
  Administered 2020-11-23 – 2020-11-29 (×7): 100 mg via ORAL
  Filled 2020-11-23 (×7): qty 1

## 2020-11-23 MED ORDER — SUGAMMADEX SODIUM 200 MG/2ML IV SOLN
INTRAVENOUS | Status: DC | PRN
  Administered 2020-11-23: 200 mg via INTRAVENOUS

## 2020-11-23 MED ORDER — METOCLOPRAMIDE HCL 5 MG PO TABS
5.0000 mg | ORAL_TABLET | Freq: Three times a day (TID) | ORAL | Status: DC | PRN
Start: 2020-11-23 — End: 2020-11-30

## 2020-11-23 MED ORDER — OXYCODONE HCL 5 MG PO TABS
10.0000 mg | ORAL_TABLET | ORAL | Status: DC | PRN
  Administered 2020-11-24: 10 mg via ORAL
  Filled 2020-11-23: qty 2

## 2020-11-23 MED ORDER — PENTAFLUOROPROP-TETRAFLUOROETH EX AERO: 1.0000 "application " | INHALATION_SPRAY | CUTANEOUS | Status: DC | PRN

## 2020-11-23 MED ORDER — 0.9 % SODIUM CHLORIDE (POUR BTL) OPTIME
TOPICAL | Status: DC | PRN
  Administered 2020-11-23: 1000 mL

## 2020-11-23 MED ORDER — ALTEPLASE 2 MG IJ SOLR
2.0000 mg | Freq: Once | INTRAMUSCULAR | Status: DC | PRN
  Filled 2020-11-23: qty 2

## 2020-11-23 MED ORDER — SODIUM CHLORIDE 0.9% FLUSH
INTRAVENOUS | Status: DC | PRN
  Administered 2020-11-23: 20 mL

## 2020-11-23 MED ORDER — PHENYLEPHRINE 40 MCG/ML (10ML) SYRINGE FOR IV PUSH (FOR BLOOD PRESSURE SUPPORT)
PREFILLED_SYRINGE | INTRAVENOUS | Status: DC | PRN
  Administered 2020-11-23 (×2): 80 ug via INTRAVENOUS
  Administered 2020-11-23: 160 ug via INTRAVENOUS

## 2020-11-23 MED ORDER — MIDAZOLAM HCL 2 MG/2ML IJ SOLN
INTRAMUSCULAR | Status: AC
  Filled 2020-11-23: qty 2

## 2020-11-23 MED ORDER — EPHEDRINE 5 MG/ML INJ
INTRAVENOUS | Status: AC
  Filled 2020-11-23: qty 10

## 2020-11-23 MED ORDER — PHENOL 1.4 % MT LIQD: 1.0000 | OROMUCOSAL | Status: DC | PRN

## 2020-11-23 MED ORDER — MENTHOL 3 MG MT LOZG: 1.0000 | LOZENGE | OROMUCOSAL | Status: DC | PRN

## 2020-11-23 MED ORDER — IRRISEPT - 450ML BOTTLE WITH 0.05% CHG IN STERILE WATER, USP 99.95% OPTIME
TOPICAL | Status: DC | PRN
  Administered 2020-11-23: 450 mL

## 2020-11-23 MED ORDER — LIDOCAINE HCL (PF) 1 % IJ SOLN
5.0000 mL | INTRAMUSCULAR | Status: DC | PRN
  Filled 2020-11-23: qty 5

## 2020-11-23 MED ORDER — CEFAZOLIN SODIUM-DEXTROSE 2-4 GM/100ML-% IV SOLN
2.0000 g | INTRAVENOUS | Status: AC
  Administered 2020-11-23: 2 g via INTRAVENOUS
  Filled 2020-11-23: qty 100

## 2020-11-23 MED ORDER — FENTANYL CITRATE (PF) 250 MCG/5ML IJ SOLN
INTRAMUSCULAR | Status: DC | PRN
  Administered 2020-11-23 (×3): 50 ug via INTRAVENOUS

## 2020-11-23 MED ORDER — ASPIRIN 81 MG PO CHEW
81.0000 mg | CHEWABLE_TABLET | Freq: Two times a day (BID) | ORAL | Status: DC
  Administered 2020-11-23 – 2020-11-29 (×13): 81 mg via ORAL
  Filled 2020-11-23 (×13): qty 1

## 2020-11-23 MED ORDER — FENTANYL CITRATE (PF) 100 MCG/2ML IJ SOLN
INTRAMUSCULAR | Status: AC
  Filled 2020-11-23: qty 2

## 2020-11-23 MED ORDER — DEXTROSE 50 % IV SOLN
INTRAVENOUS | Status: AC
  Administered 2020-11-23: 12.5 g via INTRAVENOUS
  Filled 2020-11-23: qty 50

## 2020-11-23 MED ORDER — VANCOMYCIN HCL 1 G IV SOLR
INTRAVENOUS | Status: DC | PRN
  Administered 2020-11-23: 1000 mg via TOPICAL

## 2020-11-23 MED ORDER — TRANEXAMIC ACID 1000 MG/10ML IV SOLN
2000.0000 mg | Freq: Once | INTRAVENOUS | Status: DC
  Filled 2020-11-23: qty 20

## 2020-11-23 MED ORDER — ONDANSETRON HCL 4 MG PO TABS: 4.0000 mg | ORAL_TABLET | Freq: Four times a day (QID) | ORAL | Status: DC | PRN

## 2020-11-23 SURGICAL SUPPLY — 66 items
ACETAB CUP W/GRIPTION 54 (Plate) ×2 IMPLANT
ADH SKN CLS APL DERMABOND .7 (GAUZE/BANDAGES/DRESSINGS) ×1
BAG DECANTER FOR FLEXI CONT (MISCELLANEOUS) ×2 IMPLANT
CELLS DAT CNTRL 66122 CELL SVR (MISCELLANEOUS) ×1 IMPLANT
COVER PERINEAL POST (MISCELLANEOUS) ×2 IMPLANT
COVER SURGICAL LIGHT HANDLE (MISCELLANEOUS) ×2 IMPLANT
CUP ACETAB W/GRIPTION 54 (Plate) IMPLANT
DERMABOND ADVANCED (GAUZE/BANDAGES/DRESSINGS) ×1
DERMABOND ADVANCED .7 DNX12 (GAUZE/BANDAGES/DRESSINGS) IMPLANT
DRAPE C-ARM 42X72 X-RAY (DRAPES) ×2 IMPLANT
DRAPE POUCH INSTRU U-SHP 10X18 (DRAPES) ×2 IMPLANT
DRAPE STERI IOBAN 125X83 (DRAPES) ×2 IMPLANT
DRAPE U-SHAPE 47X51 STRL (DRAPES) ×4 IMPLANT
DRSG AQUACEL AG ADV 3.5X10 (GAUZE/BANDAGES/DRESSINGS) ×2 IMPLANT
DURAPREP 26ML APPLICATOR (WOUND CARE) ×4 IMPLANT
ELECT BLADE 4.0 EZ CLEAN MEGAD (MISCELLANEOUS) ×2
ELECT REM PT RETURN 9FT ADLT (ELECTROSURGICAL) ×2
ELECTRODE BLDE 4.0 EZ CLN MEGD (MISCELLANEOUS) ×1 IMPLANT
ELECTRODE REM PT RTRN 9FT ADLT (ELECTROSURGICAL) ×1 IMPLANT
GLOVE BIOGEL PI IND STRL 7.0 (GLOVE) ×1 IMPLANT
GLOVE BIOGEL PI INDICATOR 7.0 (GLOVE) ×1
GLOVE ECLIPSE 7.0 STRL STRAW (GLOVE) ×4 IMPLANT
GLOVE SKINSENSE NS SZ7.5 (GLOVE) ×1
GLOVE SKINSENSE STRL SZ7.5 (GLOVE) ×1 IMPLANT
GLOVE SURG SYN 7.5  E (GLOVE) ×8
GLOVE SURG SYN 7.5 E (GLOVE) ×4 IMPLANT
GLOVE SURG SYN 7.5 PF PI (GLOVE) ×4 IMPLANT
GOWN STRL REIN XL XLG (GOWN DISPOSABLE) ×2 IMPLANT
GOWN STRL REUS W/ TWL LRG LVL3 (GOWN DISPOSABLE) IMPLANT
GOWN STRL REUS W/ TWL XL LVL3 (GOWN DISPOSABLE) ×1 IMPLANT
GOWN STRL REUS W/TWL LRG LVL3 (GOWN DISPOSABLE)
GOWN STRL REUS W/TWL XL LVL3 (GOWN DISPOSABLE) ×2
HANDPIECE INTERPULSE COAX TIP (DISPOSABLE) ×6
HEAD CERAMIC 36 PLUS 8.5 12 14 (Hips) ×1 IMPLANT
HOOD PEEL AWAY FLYTE STAYCOOL (MISCELLANEOUS) ×4 IMPLANT
IV NS IRRIG 3000ML ARTHROMATIC (IV SOLUTION) ×2 IMPLANT
JET LAVAGE IRRISEPT WOUND (IRRIGATION / IRRIGATOR) ×2
KIT BASIN OR (CUSTOM PROCEDURE TRAY) ×2 IMPLANT
LAVAGE JET IRRISEPT WOUND (IRRIGATION / IRRIGATOR) ×1 IMPLANT
LINER NEUTRAL 54X36MM PLUS 4 (Hips) ×1 IMPLANT
MARKER SKIN DUAL TIP RULER LAB (MISCELLANEOUS) ×2 IMPLANT
NDL SPNL 18GX3.5 QUINCKE PK (NEEDLE) ×1 IMPLANT
NEEDLE SPNL 18GX3.5 QUINCKE PK (NEEDLE) ×2 IMPLANT
PACK TOTAL JOINT (CUSTOM PROCEDURE TRAY) ×2 IMPLANT
PACK UNIVERSAL I (CUSTOM PROCEDURE TRAY) ×2 IMPLANT
RETRACTOR WND ALEXIS 18 MED (MISCELLANEOUS) IMPLANT
RTRCTR WOUND ALEXIS 18CM MED (MISCELLANEOUS) ×2
SAW OSC TIP CART 19.5X105X1.3 (SAW) ×2 IMPLANT
SCREW 6.5MMX30MM (Screw) ×1 IMPLANT
SET HNDPC FAN SPRY TIP SCT (DISPOSABLE) ×1 IMPLANT
STAPLER VISISTAT 35W (STAPLE) IMPLANT
STEM FEM ACTIS STD SZ2 (Stem) ×1 IMPLANT
SUT ETHIBOND 2 V 37 (SUTURE) ×2 IMPLANT
SUT ETHILON 2 0 FS 18 (SUTURE) ×2 IMPLANT
SUT VIC AB 0 CT1 27 (SUTURE) ×4
SUT VIC AB 0 CT1 27XBRD ANBCTR (SUTURE) ×1 IMPLANT
SUT VIC AB 1 CTX 36 (SUTURE) ×2
SUT VIC AB 1 CTX36XBRD ANBCTR (SUTURE) ×1 IMPLANT
SUT VIC AB 2-0 CT1 27 (SUTURE) ×4
SUT VIC AB 2-0 CT1 TAPERPNT 27 (SUTURE) ×2 IMPLANT
SYR 50ML LL SCALE MARK (SYRINGE) ×2 IMPLANT
TOWEL GREEN STERILE (TOWEL DISPOSABLE) ×2 IMPLANT
TRAY CATH 16FR W/PLASTIC CATH (SET/KITS/TRAYS/PACK) IMPLANT
TRAY FOLEY W/BAG SLVR 16FR (SET/KITS/TRAYS/PACK)
TRAY FOLEY W/BAG SLVR 16FR ST (SET/KITS/TRAYS/PACK) ×1 IMPLANT
YANKAUER SUCT BULB TIP NO VENT (SUCTIONS) ×3 IMPLANT

## 2020-11-23 NOTE — Anesthesia Procedure Notes (Signed)
Procedure Name: Intubation Date/Time: 11/23/2020 12:50 PM Performed by: Thelma Comp, CRNA Pre-anesthesia Checklist: Patient identified, Emergency Drugs available, Suction available and Patient being monitored Patient Re-evaluated:Patient Re-evaluated prior to induction Oxygen Delivery Method: Circle System Utilized Preoxygenation: Pre-oxygenation with 100% oxygen Induction Type: IV induction Ventilation: Mask ventilation without difficulty Laryngoscope Size: Mac and 3 Grade View: Grade I Tube type: Oral Tube size: 7.0 mm Number of attempts: 1 Airway Equipment and Method: Stylet and Oral airway Placement Confirmation: ETT inserted through vocal cords under direct vision,  positive ETCO2 and breath sounds checked- equal and bilateral Secured at: 21 cm Tube secured with: Tape Dental Injury: Teeth and Oropharynx as per pre-operative assessment

## 2020-11-23 NOTE — Transfer of Care (Signed)
Immediate Anesthesia Transfer of Care Note  Patient: Brenda Arnold  Procedure(s) Performed: LEFT TOTAL HIP ARTHROPLASTY ANTERIOR APPROACH (Left Hip)  Patient Location: PACU  Anesthesia Type:General  Level of Consciousness: drowsy and patient cooperative  Airway & Oxygen Therapy: Patient Spontanous Breathing and Patient connected to face mask oxygen  Post-op Assessment: Report given to RN and Post -op Vital signs reviewed and stable  Post vital signs: Reviewed and stable  Last Vitals:  Vitals Value Taken Time  BP 101/43 11/23/20 1544  Temp    Pulse 89 11/23/20 1545  Resp 12 11/23/20 1545  SpO2 100 % 11/23/20 1545  Vitals shown include unvalidated device data.  Last Pain:  Vitals:   11/23/20 1036  TempSrc: Oral  PainSc: 5       Patients Stated Pain Goal: 3 (01/48/40 3979)  Complications: No complications documented.

## 2020-11-23 NOTE — H&P (Signed)
PREOPERATIVE H&P  Chief Complaint: left hip degenerative joint disease  HPI: Brenda Arnold is a 64 y.o. female who presents for surgical treatment of left hip degenerative joint disease.  She denies any changes in medical history.  Past Medical History:  Diagnosis Date   Anemia    Arthritis    Asthma    Complication of anesthesia    difficulty with getting oxygen saturation up- "patient was not aware"  Bottom of feet burning in PACU   Constipation    because of Iron   Diabetes mellitus    not on meds   DVT (deep venous thrombosis) ([place]) 2019   post hip replacement - right   Dyslipidemia    Epistaxis 11/05/2012   ESRD (end stage renal disease) on dialysis ([place])    "MWF; Jeneen Rinks" (09/15/2018)- started 02/13/2017   History of blood transfusion    HTN (hypertension)    Hyperparathyroidism due to renal insufficiency (HCC)    Obesity    s/p panniculectomy   Paroxysmal A-fib ([place])    a. chronic coumadin;  b. 12/2009 Echo: EF 60-65%, Gr 1 DD.   PCOS (polycystic ovarian syndrome)    Pericarditis 08/2019   pericarditis with pericardial effusion, s/p right VATS/pericardial window 08/31/19   Pneumonia    Seasonal allergies    Sleep apnea    a. not using CPAP, last study  >8 yrs   Vitamin D deficiency    Past Surgical History:  Procedure Laterality Date   ABDOMINAL HYSTERECTOMY     with panniculctomy   AV FISTULA PLACEMENT  09/02/2012   Procedure: ARTERIOVENOUS (AV) FISTULA CREATION;  Surgeon: Elam Dutch, MD;  Location: [place];  Service: Vascular;  Laterality: Left;  Creation of Left Radial-Cephalic Fistula    BREAST SURGERY     Biopsy right breast   COLONOSCOPY     COLONOSCOPY W/ BIOPSIES AND POLYPECTOMY     DILATION AND CURETTAGE OF UTERUS     KNEE ARTHROSCOPY Right    PANNICULECTOMY     REVISON OF ARTERIOVENOUS FISTULA Left 04/27/2014   Procedure: REVISON OF LEFT RADIAL-CEPHALIC ARTERIOVENOUS FISTULA;  Surgeon: Mal Misty,  MD;  Location: [place];  Service: Vascular;  Laterality: Left;   REVISON OF ARTERIOVENOUS FISTULA Left 01/13/2020   Procedure: REVISON OF ARTERIOVENOUS FISTULA;  Surgeon: Marty Heck, MD;  Location[facility];  Service: Vascular;  Laterality: Left;   TOTAL HIP ARTHROPLASTY Right 09/15/2018   TOTAL HIP ARTHROPLASTY Right 09/15/2018   Procedure: RIGHT TOTAL HIP ARTHROPLASTY ANTERIOR APPROACH;  Surgeon: Leandrew Koyanagi, MD;  Location: [place];  Service: Orthopedics;  Laterality: Right;   UVULOPLASTY     VIDEO ASSISTED THORACOSCOPY (VATS)/WEDGE RESECTION Right 08/31/2019   Procedure: VIDEO ASSISTED THORACOSCOPY/ DRAINAGE OF PERICARDIAL EFFUSION/ PERICARDIAL WINDOW/ ABORTED PERICARDIOCENTESIS ;  Surgeon: Lajuana Matte, MD;  Location: [facility] OR;  Service: Thoracic;  Laterality: Right;   Social History   Socioeconomic History   Marital status: Single    Spouse name: Not on file   Number of children: Not on file   Years of education: Not on file   Highest education level: Not on file  Occupational History   Occupation: [employer].    Employer: [employer]  Tobacco Use   Smoking status: Never Smoker   Smokeless tobacco: Never Used  Vaping Use   Vaping Use: Never used  Substance and Sexual Activity   Alcohol use: No    Alcohol/week: 0.0 standard drinks  Drug use: No   Sexual activity: Not on file    Comment: Hysterectomy  Other Topics Concern   Not on file  Social History Narrative   Lives in [place] alone. 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   Social Determinants of Health   Financial Resource Strain: Not on file  Food Insecurity: Not on file  Transportation Needs: Not on file  Physical Activity: Not on file  Stress: Not on file  Social Connections: Not on file   Family History  Problem Relation Age of Onset   Lung cancer Father        died @ 65   Hypertension Mother    Diabetes Brother    Kidney disease  Brother    Allergies  Allergen Reactions   Iodine Shortness Of Breath   Other Shortness Of Breath and Anaphylaxis   Shellfish Allergy Shortness Of Breath   Norvasc [Amlodipine] Swelling   Prior to Admission medications   Medication Sig Start Date End Date Taking? Authorizing Provider  B Complex-C-Folic Acid (DIALYVITE TABLET) TABS Take 1 tablet by mouth daily. 05/10/20  Yes [provider]  bisacodyl (DULCOLAX) 5 MG EC tablet Take 5 mg by mouth daily as needed for moderate constipation.   Yes [provider]  calcitRIOL (ROCALTROL) 0.25 MCG capsule Take 3 capsules (0.75 mcg total) by mouth every Monday, Wednesday, and Friday with hemodialysis. 09/17/19  Yes Love, Ivan Anchors, PA-C  colchicine 0.6 MG tablet TAKE HALF TABLET BY MOUTH EVERY MONDAY, WEDNESDAY, AND FRIDAY WITH HEMODIALYSIS. Patient taking differently: Take 0.3 mg by mouth every Monday, Wednesday, and Friday. With Dialysis 05/16/20  Yes Turner, Eber Hong, MD  ethyl chloride spray Apply 1 application topically daily as needed (port access).  11/05/19  Yes [provider]  gabapentin (NEURONTIN) 100 MG capsule Take 100 mg by mouth at bedtime. 05/14/17  Yes [provider]  midodrine (PROAMATINE) 10 MG tablet Take 1 tablet (10 mg total) by mouth 3 (three) times daily with meals. 09/09/19  Yes Dessa Phi, DO  sucroferric oxyhydroxide (VELPHORO) 500 MG chewable tablet Chew 500-1,000 mg by mouth See admin instructions. Take 1000 mg 3 times daily with meals and 500 mg daily with a snack. 09/28/19  Yes [provider]  acetaminophen (TYLENOL) 650 MG CR tablet Take 650 mg by mouth every 8 (eight) hours as needed for pain.    [provider]  cephALEXin (KEFLEX) 500 MG capsule Take 1 capsule (500 mg total) by mouth 4 (four) times daily. To be taken after surgery 11/16/20   Aundra Dubin, PA-C  dextrose 50 % solution Dextrose 50% 09/13/20 09/12/21  [provider]  diphenhydrAMINE  (BENADRYL) 25 MG tablet Take 25 mg by mouth daily as needed for itching.    [provider]  docusate sodium (COLACE) 100 MG capsule Take 1 capsule (100 mg total) by mouth daily as needed. 11/16/20 11/16/21  Aundra Dubin, PA-C  ELIQUIS 5 MG TABS tablet Take 5 mg by mouth 2 (two) times daily.  12/19/18   [provider]  Menthol-Methyl Salicylate (MUSCLE RUB) 10-15 % CREA Apply 1 application topically as needed for muscle pain. 09/17/19   Love, Ivan Anchors, PA-C  methocarbamol (ROBAXIN) 500 MG tablet Take 1 tablet (500 mg total) by mouth 2 (two) times daily as needed. To be taken after surgery 11/16/20   Aundra Dubin, PA-C  Methoxy PEG-Epoetin Beta (MIRCERA IJ) Mircera 01/12/20 01/10/21  [provider]  ondansetron [facility]) 4  MG tablet Take 1 tablet (4 mg total) by mouth every 8 (eight) hours as needed for nausea or vomiting. 11/16/20   Aundra Dubin, PA-C  oxyCODONE (ROXICODONE) 5 MG immediate release tablet Take 1 tablet (5 mg total) by mouth every 6 (six) hours as needed for severe pain. 01/14/20   Dagoberto Ligas, PA-C  oxyCODONE-acetaminophen (PERCOCET) 5-325 MG tablet Take 1-2 tablets by mouth every 6 (six) hours as needed. To be taken after surgery 11/16/20   Aundra Dubin, PA-C     Positive ROS: All other systems have been reviewed and were otherwise negative with the exception of those mentioned in the HPI and as above.  Physical Exam: General: Alert, no acute distress Cardiovascular: No pedal edema Respiratory: No cyanosis, no use of accessory musculature GI: abdomen soft Skin: No lesions in the area of chief complaint Neurologic: Sensation intact distally Psychiatric: Patient is competent for consent with normal mood and affect Lymphatic: no lymphedema  MUSCULOSKELETAL: exam stable  Assessment: left hip degenerative joint disease  Plan: Plan for Procedure(s): LEFT TOTAL HIP ARTHROPLASTY ANTERIOR APPROACH  The risks benefits and  alternatives were discussed with the patient including but not limited to the risks of nonoperative treatment, versus surgical intervention including infection, bleeding, nerve injury,  blood clots, cardiopulmonary complications, morbidity, mortality, among others, and they were willing to proceed.   Preoperative templating of the joint replacement has been completed, documented, and submitted to the Operating Room personnel in order to optimize intra-operative equipment management.   Eduard Roux, MD 11/23/2020 12:26 PM

## 2020-11-23 NOTE — Anesthesia Postprocedure Evaluation (Signed)
Anesthesia Post Note  Patient: Brenda Arnold  Procedure(s) Performed: LEFT TOTAL HIP ARTHROPLASTY ANTERIOR APPROACH (Left Hip)     Patient location during evaluation: PACU Anesthesia Type: General Level of consciousness: awake and alert Pain management: pain level controlled Vital Signs Assessment: post-procedure vital signs reviewed and stable Respiratory status: spontaneous breathing, nonlabored ventilation and respiratory function stable Cardiovascular status: blood pressure returned to baseline and stable Postop Assessment: no apparent nausea or vomiting Anesthetic complications: no   No complications documented.  Last Vitals:  Vitals:   11/23/20 1644 11/23/20 1710  BP: (!) 106/50   Pulse: 88   Resp:    Temp:    SpO2: 96% 97%    Last Pain:  Vitals:   11/23/20 1613  TempSrc:   PainSc: 3                  Lidia Collum

## 2020-11-23 NOTE — Progress Notes (Signed)
PT Cancellation Note  Patient Details Name: Brenda Arnold MRN: [id number] DOB: 1956/08/09   Cancelled Treatment:    Reason Eval/Treat Not Completed: Medical issues which prohibited therapy PT was outside of pt's room and heard pt anxious, in pain, increased RR.  RN was present and calming pt.  PT returned later and RN reports pt was anxious, c/o feet numb/itching, and in pain.  She had been given pain meds and benadryl now.  PT checked on pt and she was grimacing at rest and still reports pain.  Pt and friend present were shocked that PT checking on pt today , stating she just got up from surgery.  Pt declining PT due to pain.  Will f/u as able. Abran Richard, PT Acute Rehab Services Pager [phone] [facility] [place] 11/23/2020, 5:42 PM

## 2020-11-23 NOTE — Op Note (Signed)
LEFT TOTAL HIP ARTHROPLASTY ANTERIOR APPROACH  Procedure Note Brenda Arnold   [id number]  Pre-op Diagnosis: left hip degenerative joint disease     Post-op Diagnosis: same   Operative Procedures  1. Total hip replacement; Left hip; uncemented [id number]   Personnel  Surgeon(s): Brenda Koyanagi, MD  Assist: Brenda Rob, PA-C; necessary for the timely completion of procedure and due to complexity of procedure.   Anesthesia: general, exparel local  Prosthesis: Depuy Acetabulum: Pinnacle 54 mm Femur: Actis 2 STD Head: 36 mm size: +8.5 Liner: +4 Bearing Type: ceramic on poly  Total Hip Arthroplasty (Anterior Approach) Op Note:  After informed consent was obtained and the operative extremity marked in the holding area, the patient was brought back to the operating room and placed supine on the HANA table. Next, the operative extremity was prepped and draped in normal sterile fashion. Surgical timeout occurred verifying patient identification, surgical site, surgical procedure and administration of antibiotics.  A modified anterior Smith-Peterson approach to the hip was performed, using the interval between tensor fascia lata and sartorius.  Dissection was carried bluntly down onto the anterior hip capsule. The lateral femoral circumflex vessels were identified and coagulated. A capsulotomy was performed and the capsular flaps tagged for later repair.  The neck osteotomy was performed. The femoral head was removed which showed completely denuded cartilage, the acetabular rim was cleared of soft tissue, osteophytes, and calcified labrum and attention was turned to reaming the acetabulum.  Sequential reaming was performed under fluoroscopic guidance. We reamed to a size 53 mm, and then impacted the acetabular shell. A 30 mm cancellous screw was placed through the shell for added fixation.  The liner was then placed after irrigation and attention turned to the femur.  After placing  the femoral hook, the leg was taken to externally rotated, extended and adducted position taking care to perform soft tissue releases to allow for adequate mobilization of the femur. Soft tissue was cleared from the shoulder of the greater trochanter and the hook elevator used to improve exposure of the proximal femur. Sequential broaching performed up to a size 2. Trial neck and head were placed. The leg was brought back up to neutral and the construct reduced.  Antibiotic irrigation was placed in the surgical wound and kept for at least 1 minute.  The position and sizing of components, offset and leg lengths were checked using fluoroscopy. Stability of the construct was checked in extension and external rotation without any subluxation or impingement of prosthesis. We dislocated the prosthesis, dropped the leg back into position, removed trial components, and irrigated copiously. The final stem and head was then placed, the leg brought back up, the system reduced and fluoroscopy used to verify positioning.  We irrigated, obtained hemostasis and closed the capsule using #2 ethibond suture.  One gram of vancomycin powder was placed in the surgical bed. A dilute solution of 20 cc of normal saline, 1.3% exparel, 0.25% bupivacaine was injected in the soft tissues.  One gram of topical tranexamic acid was injected into the joint.  The fascia was closed with #1 vicryl plus, the deep fat layer was closed with 0 vicryl, the subcutaneous layers closed with 2.0 Vicryl Plus and the skin closed with 2.0 nylon and dermabond. A sterile dressing was applied. The patient was awakened in the operating room and taken to recovery in stable condition.  All sponge, needle, and instrument counts were correct at the end of the case.   Position:  supine  Complications: see description of procedure.  Time Out: performed   Drains/Packing: none  Estimated blood loss: see anesthesia record  Returned to Recovery Room: in good  condition.   Antibiotics: yes   Mechanical VTE (DVT) Prophylaxis: sequential compression devices, TED thigh-high  Chemical VTE (DVT) Prophylaxis: aspirin   Fluid Replacement: see anesthesia record  Specimens Removed: 1 to pathology   Sponge and Instrument Count Correct? yes   PACU: portable radiograph - low AP   Plan/RTC: Return in 2 weeks for staple removal. Weight Bearing/Load Lower Extremity: full  Hip precautions: none Suture Removal: 2 weeks   N. Eduard Roux, MD Brenda Arnold 3:09 PM   Implant Name Type Inv. Item Serial No. Manufacturer Lot No. LRB No. Used Action  ACETAB CUP W GRIPTION 54MM - SN/A Plate ACETAB CUP W GRIPTION 54MM N/A [employer] [id number] Left 1 Implanted  SCREW 6.5MMX30MM - SN/A Screw SCREW 6.5MMX30MM N/A [employer] [id number] Left 1 Implanted  LINER NEUTRAL 54X36MM PLUS 4 - SN/A Hips LINER NEUTRAL 54X36MM PLUS 4 N/A [employer] [id number] Left 1 Implanted  STEM FEM ACTIS STD SZ2 - SN/A Stem STEM FEM ACTIS STD SZ2 N/A [employer] [id number] Left 1 Implanted  HEAD CERAMIC 36 PLUS 8.5 12 14  - SN/A Hips HEAD CERAMIC 36 PLUS 8.5 12 14  N/A [employer] [id number] Left 1 Implanted

## 2020-11-23 NOTE — Progress Notes (Addendum)
Pt. Reported she was to stop eliquis 3 days prior to surgery. She stated she took it on 11-21-20, 1 tab. By accident. Notified Dr. Valma Cava and also Dr. Erlinda Hong. Dr. Erlinda Hong stated okay to proceed.  1250 blood sugar 60. Nonsymptomatic. Dextrose 50% solution 12.5 g iv given. Continue to monitor.

## 2020-11-24 LAB — CBC
HCT: 29.3 % — ABNORMAL LOW (ref 36.0–46.0)
Hemoglobin: 9.3 g/dL — ABNORMAL LOW (ref 12.0–15.0)
MCH: 31 pg (ref 26.0–34.0)
MCHC: 31.7 g/dL (ref 30.0–36.0)
MCV: 97.7 fL (ref 80.0–100.0)
Platelets: 134 10*3/uL — ABNORMAL LOW (ref 150–400)
RBC: 3 MIL/uL — ABNORMAL LOW (ref 3.87–5.11)
RDW: 15.5 % (ref 11.5–15.5)
WBC: 9 10*3/uL (ref 4.0–10.5)
nRBC: 0 % (ref 0.0–0.2)

## 2020-11-24 LAB — BASIC METABOLIC PANEL
Anion gap: 14 (ref 5–15)
BUN: 44 mg/dL — ABNORMAL HIGH (ref 8–23)
CO2: 25 mmol/L (ref 22–32)
Calcium: 9.1 mg/dL (ref 8.9–10.3)
Chloride: 98 mmol/L (ref 98–111)
Creatinine, Ser: 10.16 mg/dL — ABNORMAL HIGH (ref 0.44–1.00)
GFR, Estimated: 4 mL/min — ABNORMAL LOW (ref >60)
Glucose, Bld: 145 mg/dL — ABNORMAL HIGH (ref 70–99)
Potassium: 5.2 mmol/L — ABNORMAL HIGH (ref 3.5–5.1)
Sodium: 137 mmol/L (ref 135–145)

## 2020-11-24 MED ORDER — CALCITRIOL 0.5 MCG PO CAPS
2.0000 ug | ORAL_CAPSULE | ORAL | Status: DC
  Administered 2020-11-24 – 2020-11-29 (×3): 2 ug via ORAL
  Filled 2020-11-24 (×3): qty 4

## 2020-11-24 MED ORDER — CALCITRIOL 0.5 MCG PO CAPS
ORAL_CAPSULE | ORAL | Status: AC
  Filled 2020-11-24: qty 4

## 2020-11-24 MED ORDER — SODIUM CHLORIDE 0.9 % IV SOLN
100.0000 mg | INTRAVENOUS | Status: DC
  Administered 2020-11-24 – 2020-11-29 (×3): 100 mg via INTRAVENOUS
  Filled 2020-11-24 (×4): qty 5

## 2020-11-24 MED ORDER — CEPHALEXIN 250 MG PO CAPS: 250.0000 mg | ORAL_CAPSULE | Freq: Two times a day (BID) | ORAL | 0 refills | Status: AC

## 2020-11-24 MED ORDER — OXYCODONE HCL 5 MG PO TABS: 5.0000 mg | ORAL_TABLET | Freq: Four times a day (QID) | ORAL | 0 refills | Status: DC | PRN

## 2020-11-24 MED ORDER — DARBEPOETIN ALFA 40 MCG/0.4ML IJ SOSY
40.0000 ug | PREFILLED_SYRINGE | INTRAMUSCULAR | Status: DC
  Administered 2020-11-24: 40 ug via INTRAVENOUS
  Filled 2020-11-24: qty 0.4

## 2020-11-24 MED ORDER — DARBEPOETIN ALFA 40 MCG/0.4ML IJ SOSY
PREFILLED_SYRINGE | INTRAMUSCULAR | Status: AC
  Filled 2020-11-24: qty 0.4

## 2020-11-24 MED ORDER — MIDODRINE HCL 5 MG PO TABS
ORAL_TABLET | ORAL | Status: AC
  Filled 2020-11-24: qty 2

## 2020-11-24 NOTE — Evaluation (Signed)
Physical Therapy Evaluation Patient Details Name: Brenda Arnold MRN: [id number] DOB: [birth year] Today's Date: 11/24/2020   History of Present Illness  Patient is a 64 y/o female with PMH includes anemia, DM, ESRD on HD, HTN, hyperparathyroidism, a-fib, PCOS, sleep apnea. Patient s/p L THA on 12/23.  Clinical Impression  Prior to surgery, patient was independent with RW for ambulation and currently working from home. Patient presents with deficits in strength, endurance, and mobility. Patient with increased pain with movement. Patient overall requires minA for bed mobility and transfers. Unable to take steps fwd this session, able to sidestep to [facility] with RW but unable to clear L foot from floor. Patient will benefit from skilled PT services to address listed deficits. Recommend SNF following d/c to maximize functional independence.     Follow Up Recommendations SNF;Supervision/Assistance - 24 hour    Equipment Recommendations  None recommended by PT (pt owns necessary DME)    Recommendations for Other Services       Precautions / Restrictions Precautions Precautions: Fall Restrictions Weight Bearing Restrictions: Yes LLE Weight Bearing: Weight bearing as tolerated      Mobility  Bed Mobility Overal bed mobility: Needs Assistance Bed Mobility: Supine to Sit;Sit to Supine     Supine to sit: Min assist Sit to supine: Min assist   General bed mobility comments: minA required for L LE advancement on/off bed    Transfers Overall transfer level: Needs assistance Equipment used: Rolling [name] (2 wheeled) Transfers: Sit to/from Stand Sit to Stand: Min assist         General transfer comment: minA for power up, cues for hand placement  Ambulation/Gait Ambulation/Gait assistance: Min guard Gait Distance (Feet): 2 Feet Assistive device: Rolling [name] (2 wheeled) Gait Pattern/deviations: Step-to pattern     General Gait Details: side steps towards HOB, unable to  clear L foot from ground  Stairs            Wheelchair Mobility    Modified Rankin (Stroke Patients Only)       Balance Overall balance assessment: Needs assistance Sitting-balance support: No upper extremity supported;Feet supported Sitting balance-Leahy Scale: Fair     Standing balance support: Bilateral upper extremity supported;During functional activity Standing balance-Leahy Scale: Poor Standing balance comment: reliant on UE support                             Pertinent Vitals/Pain Pain Assessment: 0-10 Pain Score: 4  Pain Location: L hip Pain Descriptors / Indicators: Guarding;Grimacing Pain Intervention(s): Monitored during session    Home Living Family/patient expects to be discharged to:: Private residence Living Arrangements: Alone Available Help at Discharge: Friend(s);Available PRN/intermittently Type of Home: House Home Access: Stairs to enter   Entrance Stairs-Number of Steps: 1 Home Layout: One level Home Equipment: Tub bench;[name] - 2 wheels;Cane - single point;Bedside commode      Prior Function Level of Independence: Independent with assistive device(s)         Comments: uses RW all the time     Hand Dominance        Extremity/Trunk Assessment   Upper Extremity Assessment Upper Extremity Assessment: Generalized weakness    Lower Extremity Assessment Lower Extremity Assessment: Generalized weakness;LLE deficits/detail LLE Deficits / Details: unable to lift against gravity, grossly 2-/5 LLE: Unable to fully assess due to pain       Communication   Communication: No difficulties  Cognition Arousal/Alertness: Awake/alert Behavior During Therapy: [facility] for  tasks assessed/performed Overall Cognitive Status: Within Functional Limits for tasks assessed                                        General Comments      Exercises     Assessment/Plan    PT Assessment Patient needs continued PT  services  PT Problem List Decreased strength;Decreased range of motion;Decreased activity tolerance;Decreased balance;Decreased mobility       PT Treatment Interventions DME instruction;Gait training;Stair training;Functional mobility training;Therapeutic activities;Therapeutic exercise;Balance training;Patient/family education    PT Goals (Current goals can be found in the Care Plan section)  Acute Rehab PT Goals Patient Stated Goal: to be independent PT Goal Formulation: With patient Time For Goal Achievement: 12/08/20 Potential to Achieve Goals: Good    Frequency 7X/week   Barriers to discharge        Co-evaluation               AM-PAC PT "6 Clicks" Mobility  Outcome Measure Help needed turning from your back to your side while in a flat bed without using bedrails?: A Little Help needed moving from lying on your back to sitting on the side of a flat bed without using bedrails?: A Little Help needed moving to and from a bed to a chair (including a wheelchair)?: A Little Help needed standing up from a chair using your arms (e.g., wheelchair or bedside chair)?: A Little Help needed to walk in hospital room?: A Lot Help needed climbing 3-5 steps with a railing? : Total 6 Click Score: 15    End of Session Equipment Utilized During Treatment: Gait belt Activity Tolerance: Patient limited by pain Patient left: in bed;with call bell/phone within reach;with bed alarm set Nurse Communication: Mobility status PT Visit Diagnosis: Unsteadiness on feet (R26.81);Other abnormalities of gait and mobility (R26.89);Muscle weakness (generalized) (M62.81)    Time: 2111-5520 PT Time Calculation (min) (ACUTE ONLY): 31 min   Charges:   PT Evaluation $PT Eval Moderate Complexity: 1 Mod          Perrin Maltese, PT, DPT Acute Rehabilitation Services Pager [phone] Office [phone]   Melene Plan Allred 11/24/2020, 9:25 AM

## 2020-11-24 NOTE — Procedures (Signed)
Patient seen on Hemodialysis. BP (!) 88/46 (BP Location: Right Arm)   Pulse (!) 54   Temp 97.7 F (36.5 C) (Oral)   Resp 18   Ht 5\' 6"  (1.676 m)   Wt 108.6 kg   SpO2 96%   BMI 38.64 kg/m   QB 350, UF goal 2.5L Tolerating treatment without complaints at this time.   Elmarie Shiley MD [facility]. Office # [phone] Pager # [phone] 1:59 PM

## 2020-11-24 NOTE — Consult Note (Addendum)
[facility]  HISTORY AND PHYSICAL  Brenda Arnold is an 64 y.o. female.    Chief Complaint: L hip THA  HPI: Pt is a 47F with a PMH sig for HTN, HLD, DM II, h/o pericarditis, Afib on Eliquis, and ESRD on HD MWF who is now seen in consultation at the request of Dr Erlinda Hong for management of ESRD and provision of HD.  Pt underwent L THA yesterday with Dr Erlinda Hong.  She did well with no immediate complications of the surgery.  EBL 600 mL.  She is sitting in bed eating breakfast this AM.  She had some pain but otherwise no complaints.    Her last HD session was 12/22 and she completed it in full.  There were no complications.    Dialysis orders:  [facility] MWF 4 hrs EDW 105 kg F200 dialyzer 2K/ 2.25 Ca  BFR 350/ DFR 800 No UF profile No heparin Mircera 60 mcg q 2 weeks Venofer 100 mg q 8 times (ending 12/04/2020) Calcitriol 2.0 mcg q rx Parsabiv 12.5 mcg q rx  PMH: Past Medical History:  Diagnosis Date  . Anemia   . Arthritis   . Asthma   . Complication of anesthesia    difficulty with getting oxygen saturation up- "patient was not aware"  Bottom of feet burning in PACU  . Constipation    because of Iron  . Diabetes mellitus    not on meds  . DVT (deep venous thrombosis) ([place]) 2019   post hip replacement - right  . Dyslipidemia   . Epistaxis 11/05/2012  . ESRD (end stage renal disease) on dialysis [facility])    "MWF; Jeneen Rinks" (09/15/2018)- started 02/13/2017  . History of blood transfusion   . HTN (hypertension)   . Hyperparathyroidism due to renal insufficiency ([place])   . Obesity    s/p panniculectomy  . Paroxysmal A-fib ([facility])    a. chronic coumadin;  b. 12/2009 Echo: EF 60-65%, Gr 1 DD.  Marland Kitchen PCOS (polycystic ovarian syndrome)   . Pericarditis 08/2019   pericarditis with pericardial effusion, s/p right VATS/pericardial window 08/31/19  . Pneumonia   . Seasonal allergies   . Sleep apnea    a. not using CPAP, last study  >8 yrs  . Vitamin D deficiency    PSH: Past  Surgical History:  Procedure Laterality Date  . ABDOMINAL HYSTERECTOMY     with panniculctomy  . AV FISTULA PLACEMENT  09/02/2012   Procedure: ARTERIOVENOUS (AV) FISTULA CREATION;  Surgeon: Elam Dutch, MD;  Location: [facility] OR;  Service: Vascular;  Laterality: Left;  Creation of Left Radial-Cephalic Fistula   . BREAST SURGERY     Biopsy right breast  . COLONOSCOPY    . COLONOSCOPY W/ BIOPSIES AND POLYPECTOMY    . DILATION AND CURETTAGE OF UTERUS    . KNEE ARTHROSCOPY Right   . PANNICULECTOMY    . REVISON OF ARTERIOVENOUS FISTULA Left 04/27/2014   Procedure: REVISON OF LEFT RADIAL-CEPHALIC ARTERIOVENOUS FISTULA;  Surgeon: Mal Misty, MD;  Location: [place];  Service: Vascular;  Laterality: Left;  . REVISON OF ARTERIOVENOUS FISTULA Left 01/13/2020   Procedure: REVISON OF ARTERIOVENOUS FISTULA;  Surgeon: Marty Heck, MD;  Location: [place];  Service: Vascular;  Laterality: Left;  . TOTAL HIP ARTHROPLASTY Right 09/15/2018  . TOTAL HIP ARTHROPLASTY Right 09/15/2018   Procedure: RIGHT TOTAL HIP ARTHROPLASTY ANTERIOR APPROACH;  Surgeon: Leandrew Koyanagi, MD;  Location: [place];  Service: Orthopedics;  Laterality: Right;  .  UVULOPLASTY    . VIDEO ASSISTED THORACOSCOPY (VATS)/WEDGE RESECTION Right 08/31/2019   Procedure: VIDEO ASSISTED THORACOSCOPY/ DRAINAGE OF PERICARDIAL EFFUSION/ PERICARDIAL WINDOW/ ABORTED PERICARDIOCENTESIS ;  Surgeon: Lajuana Matte, MD;  Location: [place];  Service: Thoracic;  Laterality: Right;    Past Medical History:  Diagnosis Date  . Anemia   . Arthritis   . Asthma   . Complication of anesthesia    difficulty with getting oxygen saturation up- "patient was not aware"  Bottom of feet burning in PACU  . Constipation    because of Iron  . Diabetes mellitus    not on meds  . DVT (deep venous thrombosis) ([place]) 2019   post hip replacement - right  . Dyslipidemia   . Epistaxis 11/05/2012  . ESRD (end stage renal disease) on dialysis [facility])    "MWF; Jeneen Rinks" (09/15/2018)- started 02/13/2017  . History of blood transfusion   . HTN (hypertension)   . Hyperparathyroidism due to renal insufficiency ([place])   . Obesity    s/p panniculectomy  . Paroxysmal A-fib ([facility])    a. chronic coumadin;  b. 12/2009 Echo: EF 60-65%, Gr 1 DD.  Marland Kitchen PCOS (polycystic ovarian syndrome)   . Pericarditis 08/2019   pericarditis with pericardial effusion, s/p right VATS/pericardial window 08/31/19  . Pneumonia   . Seasonal allergies   . Sleep apnea    a. not using CPAP, last study  >8 yrs  . Vitamin D deficiency     Medications:   Scheduled: . acetaminophen  1,000 mg Oral Q6H  . apixaban  5 mg Oral BID  . aspirin  81 mg Oral BID  . calcitRIOL  0.75 mcg Oral Q M,W,F-HD  . Chlorhexidine Gluconate Cloth  6 each Topical Q0600  . colchicine  0.3 mg Oral Q M,W,F  . dexamethasone (DECADRON) injection  10 mg Intravenous Once  . docusate sodium  100 mg Oral BID  . gabapentin  100 mg Oral QHS  . midodrine  10 mg Oral TID WC  . oxyCODONE  10 mg Oral Q12H  . polyethylene glycol  17 g Oral Daily  . sucroferric oxyhydroxide  1,000 mg Oral TID with meals  . sucroferric oxyhydroxide  500 mg Oral With snacks    Medications Prior to Admission  Medication Sig Dispense Refill  . B Complex-C-Folic Acid (DIALYVITE TABLET) TABS Take 1 tablet by mouth daily.    . bisacodyl (DULCOLAX) 5 MG EC tablet Take 5 mg by mouth daily as needed for moderate constipation.    . calcitRIOL (ROCALTROL) 0.25 MCG capsule Take 3 capsules (0.75 mcg total) by mouth every Monday, Wednesday, and Friday with hemodialysis.    Marland Kitchen colchicine 0.6 MG tablet TAKE HALF TABLET BY MOUTH EVERY MONDAY, WEDNESDAY, AND FRIDAY WITH HEMODIALYSIS. (Patient taking differently: Take 0.3 mg by mouth every Monday, Wednesday, and Friday. With Dialysis) 45 tablet 1  . ethyl chloride spray Apply 1 application topically daily as needed (port access).     . gabapentin (NEURONTIN) 100 MG capsule Take 100 mg by mouth at bedtime.   3  . midodrine (PROAMATINE) 10 MG tablet Take 1 tablet (10 mg total) by mouth 3 (three) times daily with meals.    . sucroferric oxyhydroxide (VELPHORO) 500 MG chewable tablet Chew 500-1,000 mg by mouth See admin instructions. Take 1000 mg 3 times daily with meals and 500 mg daily with a snack.    Marland Kitchen acetaminophen (TYLENOL) 650 MG CR tablet Take 650 mg by mouth every 8 (  eight) hours as needed for pain.    . cephALEXin (KEFLEX) 500 MG capsule Take 1 capsule (500 mg total) by mouth 4 (four) times daily. To be taken after surgery 40 capsule 0  . dextrose 50 % solution Dextrose 50%    . diphenhydrAMINE (BENADRYL) 25 MG tablet Take 25 mg by mouth daily as needed for itching.    . docusate sodium (COLACE) 100 MG capsule Take 1 capsule (100 mg total) by mouth daily as needed. 30 capsule 2  . ELIQUIS 5 MG TABS tablet Take 5 mg by mouth 2 (two) times daily.     . Menthol-Methyl Salicylate (MUSCLE RUB) 10-15 % CREA Apply 1 application topically as needed for muscle pain.  0  . methocarbamol (ROBAXIN) 500 MG tablet Take 1 tablet (500 mg total) by mouth 2 (two) times daily as needed. To be taken after surgery 20 tablet 0  . Methoxy PEG-Epoetin Beta (MIRCERA IJ) Mircera    . ondansetron (ZOFRAN) 4 MG tablet Take 1 tablet (4 mg total) by mouth every 8 (eight) hours as needed for nausea or vomiting. 40 tablet 0  . oxyCODONE (ROXICODONE) 5 MG immediate release tablet Take 1 tablet (5 mg total) by mouth every 6 (six) hours as needed for severe pain. 10 tablet 0  . oxyCODONE-acetaminophen (PERCOCET) 5-325 MG tablet Take 1-2 tablets by mouth every 6 (six) hours as needed. To be taken after surgery 40 tablet 0    ALLERGIES:   Allergies  Allergen Reactions  . Iodine Shortness Of Breath  . Other Shortness Of Breath and Anaphylaxis  . Shellfish Allergy Shortness Of Breath  . Norvasc [Amlodipine] Swelling    FAM HX: Family History  Problem Relation Age of Onset  . Lung cancer Father        died @ 17  .  Hypertension Mother   . Diabetes Brother   . Kidney disease Brother     Social History:   reports that she has never smoked. She has never used smokeless tobacco. She reports that she does not drink alcohol and does not use drugs.  ROS: ROS: all other systems reviewed and are negative except per HPI  Blood pressure (!) 90/51, pulse 96, temperature (!) 97.5 F (36.4 C), temperature source Oral, resp. rate 16, height 5\' 6"  (1.676 m), weight 108.8 kg, SpO2 100 %. PHYSICAL EXAM: Physical Exam  GEN: NAD, sitting in bed HEENT EOMI PERRL  NECK no JVD PULM clear bilaterally CV RRR ABD obese EXT no LE edema NEURO AAO x 3 nonfocal MSK: L hip in immobilizer ACCESS: L AVF + T/B, some thinned skin     Results for orders placed or performed during the hospital encounter of 11/23/20 (from the past 48 hour(s))  Glucose, capillary     Status: None   Collection Time: 11/23/20 10:35 AM  Result Value Ref Range   Glucose-Capillary 73 70 - 99 mg/dL    Comment: Glucose reference range applies only to samples taken after fasting for at least 8 hours.   Comment 1 Notify RN    Comment 2 Document in Chart   I-STAT, chem 8     Status: Abnormal   Collection Time: 11/23/20 10:48 AM  Result Value Ref Range   Sodium 139 135 - 145 mmol/L   Potassium 3.8 3.5 - 5.1 mmol/L   Chloride 98 98 - 111 mmol/L   BUN 36 (H) 8 - 23 mg/dL   Creatinine, Ser 8.90 (H) 0.44 - 1.00 mg/dL  Glucose, Bld 73 70 - 99 mg/dL    Comment: Glucose reference range applies only to samples taken after fasting for at least 8 hours.   Calcium, Ion 1.05 (L) 1.15 - 1.40 mmol/L   TCO2 28 22 - 32 mmol/L   Hemoglobin 13.3 12.0 - 15.0 g/dL   HCT 39.0 36.0 - 46.0 %  Glucose, capillary     Status: Abnormal   Collection Time: 11/23/20 11:51 AM  Result Value Ref Range   Glucose-Capillary 60 (L) 70 - 99 mg/dL    Comment: Glucose reference range applies only to samples taken after fasting for at least 8 hours.  Glucose, capillary      Status: Abnormal   Collection Time: 11/23/20 12:30 PM  Result Value Ref Range   Glucose-Capillary 102 (H) 70 - 99 mg/dL    Comment: Glucose reference range applies only to samples taken after fasting for at least 8 hours.  Glucose, capillary     Status: Abnormal   Collection Time: 11/23/20  3:43 PM  Result Value Ref Range   Glucose-Capillary 117 (H) 70 - 99 mg/dL    Comment: Glucose reference range applies only to samples taken after fasting for at least 8 hours.  Renal function panel     Status: Abnormal   Collection Time: 11/23/20  8:29 PM  Result Value Ref Range   Sodium 139 135 - 145 mmol/L   Potassium 3.9 3.5 - 5.1 mmol/L   Chloride 100 98 - 111 mmol/L   CO2 25 22 - 32 mmol/L   Glucose, Bld 134 (H) 70 - 99 mg/dL    Comment: Glucose reference range applies only to samples taken after fasting for at least 8 hours.   BUN 36 (H) 8 - 23 mg/dL   Creatinine, Ser 9.12 (H) 0.44 - 1.00 mg/dL   Calcium 9.1 8.9 - 10.3 mg/dL   Phosphorus 5.4 (H) 2.5 - 4.6 mg/dL   Albumin 4.2 3.5 - 5.0 g/dL   GFR, Estimated 4 (L) >60 mL/min    Comment: (NOTE) Calculated using the CKD-EPI Creatinine Equation (2021)    Anion gap 14 5 - 15    Comment: Performed at [facility] [address], [place], [place] [address]  CBC     Status: Abnormal   Collection Time: 11/23/20  8:29 PM  Result Value Ref Range   WBC 13.1 (H) 4.0 - 10.5 K/uL   RBC 3.33 (L) 3.87 - 5.11 MIL/uL   Hemoglobin 10.5 (L) 12.0 - 15.0 g/dL   HCT 32.5 (L) 36.0 - 46.0 %   MCV 97.6 80.0 - 100.0 fL   MCH 31.5 26.0 - 34.0 pg   MCHC 32.3 30.0 - 36.0 g/dL   RDW 15.4 11.5 - 15.5 %   Platelets 124 (L) 150 - 400 K/uL    Comment: REPEATED TO VERIFY   nRBC 0.0 0.0 - 0.2 %    Comment: Performed at [facility], [place] [address] [place], [place] [address]  CBC     Status: Abnormal   Collection Time: 11/24/20  3:22 AM  Result Value Ref Range   WBC 9.0 4.0 - 10.5 K/uL   RBC 3.00 (L) 3.87 - 5.11 MIL/uL   Hemoglobin 9.3 (L) 12.0 -  15.0 g/dL   HCT 29.3 (L) 36.0 - 46.0 %   MCV 97.7 80.0 - 100.0 fL   MCH 31.0 26.0 - 34.0 pg   MCHC 31.7 30.0 - 36.0 g/dL   RDW 15.5 11.5 -  15.5 %   Platelets 134 (L) 150 - 400 K/uL    Comment: REPEATED TO VERIFY   nRBC 0.0 0.0 - 0.2 %    Comment: Performed at [facility], [place] [address], [place], [place] [address]  Basic metabolic panel     Status: Abnormal   Collection Time: 11/24/20  3:22 AM  Result Value Ref Range   Sodium 137 135 - 145 mmol/L   Potassium 5.2 (H) 3.5 - 5.1 mmol/L   Chloride 98 98 - 111 mmol/L   CO2 25 22 - 32 mmol/L   Glucose, Bld 145 (H) 70 - 99 mg/dL    Comment: Glucose reference range applies only to samples taken after fasting for at least 8 hours.   BUN 44 (H) 8 - 23 mg/dL   Creatinine, Ser 10.16 (H) 0.44 - 1.00 mg/dL   Calcium 9.1 8.9 - 10.3 mg/dL   GFR, Estimated 4 (L) >60 mL/min    Comment: (NOTE) Calculated using the CKD-EPI Creatinine Equation (2021)    Anion gap 14 5 - 15    Comment: Performed at [facility] [address] [place], [place] [address]    DG Pelvis Portable  Result Date: 11/23/2020 CLINICAL DATA:  Follow-up total hip replacement EXAM: PORTABLE PELVIS 1-2 VIEWS COMPARISON:  10/06/2020 FINDINGS: New total hip arthroplasty of the left hip. Components appear well positioned. No radiographically detectable complication. Previous total hip arthroplasty on the right. IMPRESSION: Good appearance following total hip arthroplasty on the left. Electronically Signed   By: Nelson Chimes M.D.   On: 11/23/2020 16:05   DG C-Arm 1-60 Min  Result Date: 11/23/2020 CLINICAL DATA:  ORIF left hip. EXAM: DG C-ARM 1-60 MIN FLUOROSCOPY TIME:  Fluoroscopy Time:  36 seconds Radiation:  4.55 mGy. COMPARISON:  October 06, 2020. FINDINGS: Four C-arm fluoroscopic images were obtained intraoperatively and submitted for post operative interpretation. These images demonstrate postsurgical changes of left total hip arthroplasty. Partially imaged prior  right total hip arthroplasty. Please see the performing provider's procedural report for further detail. IMPRESSION: Intraoperative fluoroscopic images, as detailed above. Electronically Signed   By: Margaretha Sheffield MD   On: 11/23/2020 15:22   DG HIP OPERATIVE UNILAT W OR W/O PELVIS LEFT  Result Date: 11/23/2020 CLINICAL DATA:  Left hip arthroplasty EXAM: OPERATIVE LEFT HIP (WITH PELVIS IF PERFORMED) 2 VIEWS TECHNIQUE: Fluoroscopic spot image(s) were submitted for interpretation post-operatively. COMPARISON:  10/06/2020 FINDINGS: Four fluoroscopic images are obtained during the performance of the procedure and are provided for interpretation only. Initial images demonstrate severe left hip osteoarthritis. Subsequent images demonstrate placement of a left hip arthroplasty in the expected position without signs of acute complication. Stable right hip arthroplasty. FLUOROSCOPY TIME:  36 seconds IMPRESSION: 1. Left hip arthroplasty as above. Electronically Signed   By: Randa Ngo M.D.   On: 11/23/2020 15:22    Assessment/Plan  1.  S/p L hip THA: did well.  Per ortho.  2.  ESRD on HD: will provide HD on schedule today.  Takes midodrine.    3.  Anemia: postop Hgb 9.5, continue Fe load and will give extra dose of Aranesp today  4.  BMM: parsabiv not on formulary, will give calcitriol with HD  5.  Nutrition: renal Vit/ binder (velphoro 3 TID AC) / diet  6.  Afib: Eliquis being restarted  7.  H/o pericarditis: on colchicine   8.  Dispo: pending  Lorie Melichar 11/24/2020, 8:01 AM

## 2020-11-24 NOTE — Progress Notes (Signed)
   Subjective:  Patient reports pain as mild.  Feeling much better this morning.  Objective:   VITALS:   Vitals:   11/24/20 0229 11/24/20 0229 11/24/20 0512 11/24/20 0851  BP: (!) 106/44 (!) 106/44 (!) 90/51 92/68  Pulse: (!) 101 (!) 101 96 91  Resp: 18  16 18   Temp: (!) 97.4 F (36.3 C) (!) 97.4 F (36.3 C) (!) 97.5 F (36.4 C) 98.3 F (36.8 C)  TempSrc: Oral Oral Oral Oral  SpO2: 100% 100% 100% 98%  Weight:      Height:        Sensation intact distally Intact pulses distally Dorsiflexion/Plantar flexion intact Incision: dressing C/D/I and no drainage   Lab Results  Component Value Date   WBC 9.0 11/24/2020   HGB 9.3 (L) 11/24/2020   HCT 29.3 (L) 11/24/2020   MCV 97.7 11/24/2020   PLT 134 (L) 11/24/2020     Assessment/Plan:  1 Day Post-Op   - Expected postop acute blood loss anemia - will monitor for symptoms - Up with PT/OT - recommend SNF due to living alone at home - DVT ppx - SCDs, ambulation, aspirin - WBAT operative extremity - 2 weeks of keflex 250 mg BID - Pain control - Discharge planning - anticipate d/c to SNF on Sunday at the earliest - dialysis MWF - Rx in chart  Eduard Roux 11/24/2020, 11:20 AM [phone]

## 2020-11-24 NOTE — Discharge Instructions (Addendum)
° °INSTRUCTIONS AFTER JOINT REPLACEMENT  ° °o Remove items at home which could result in a fall. This includes throw rugs or furniture in walking pathways °o ICE to the affected joint every three hours while awake for 30 minutes at a time, for at least the first 3-5 days, and then as needed for pain and swelling.  Continue to use ice for pain and swelling. You may notice swelling that will progress down to the foot and ankle.  This is normal after surgery.  Elevate your leg when you are not up walking on it.   °o Continue to use the breathing machine you got in the hospital (incentive spirometer) which will help keep your temperature down.  It is common for your temperature to cycle up and down following surgery, especially at night when you are not up moving around and exerting yourself.  The breathing machine keeps your lungs expanded and your temperature down. ° ° °DIET:  As you were doing prior to hospitalization, we recommend a well-balanced diet. ° °DRESSING / WOUND CARE / SHOWERING ° °You may change your surgical dressing 7 days after surgery.  Then change the dressing every day with sterile gauze.  Please use good hand washing techniques before changing the dressing.  Do not use any lotions or creams on the incision until instructed by your surgeon.  You may shower while you have the surgical dressing which is waterproof.  After removal of surgical dressing, you must cover the incision when showering. ° °ACTIVITY ° °o Increase activity slowly as tolerated, but follow the weight bearing instructions below.   °o No driving for 6 weeks or until further direction given by your physician.  You cannot drive while taking narcotics.  °o No lifting or carrying greater than 10 lbs. until further directed by your surgeon. °o Avoid periods of inactivity such as sitting longer than an hour when not asleep. This helps prevent blood clots.  °o You may return to work once you are authorized by your doctor.  ° ° ° °WEIGHT  BEARING  ° °Weight bearing as tolerated with assist device (walker, cane, etc) as directed, use it as long as suggested by your surgeon or therapist, typically at least 4-6 weeks. ° ° °EXERCISES ° °Results after joint replacement surgery are often greatly improved when you follow the exercise, range of motion and muscle strengthening exercises prescribed by your doctor. Safety measures are also important to protect the joint from further injury. Any time any of these exercises cause you to have increased pain or swelling, decrease what you are doing until you are comfortable again and then slowly increase them. If you have problems or questions, call your caregiver or physical therapist for advice.  ° °Rehabilitation is important following a joint replacement. After just a few days of immobilization, the muscles of the leg can become weakened and shrink (atrophy).  These exercises are designed to build up the tone and strength of the thigh and leg muscles and to improve motion. Often times heat used for twenty to thirty minutes before working out will loosen up your tissues and help with improving the range of motion but do not use heat for the first two weeks following surgery (sometimes heat can increase post-operative swelling).  ° °These exercises can be done on a training (exercise) mat, on the floor, on a table or on a bed. Use whatever works the best and is most comfortable for you.    Use music or television   while you are exercising so that the exercises are a pleasant break in your day. This will make your life better with the exercises acting as a break in your routine that you can look forward to.   Perform all exercises about fifteen times, three times per day or as directed.  You should exercise both the operative leg and the other leg as well. ° °Exercises include: °  °• Quad Sets - Tighten up the muscle on the front of the thigh (Quad) and hold for 5-10 seconds.   °• Straight Leg Raises - With your  knee straight (if you were given a brace, keep it on), lift the leg to 60 degrees, hold for 3 seconds, and slowly lower the leg.  Perform this exercise against resistance later as your leg gets stronger.  °• Leg Slides: Lying on your back, slowly slide your foot toward your buttocks, bending your knee up off the floor (only go as far as is comfortable). Then slowly slide your foot back down until your leg is flat on the floor again.  °• Angel Wings: Lying on your back spread your legs to the side as far apart as you can without causing discomfort.  °• Hamstring Strength:  Lying on your back, push your heel against the floor with your leg straight by tightening up the muscles of your buttocks.  Repeat, but this time bend your knee to a comfortable angle, and push your heel against the floor.  You may put a pillow under the heel to make it more comfortable if necessary.  ° °A rehabilitation program following joint replacement surgery can speed recovery and prevent re-injury in the future due to weakened muscles. Contact your doctor or a physical therapist for more information on knee rehabilitation.  ° ° °CONSTIPATION ° °Constipation is defined medically as fewer than three stools per week and severe constipation as less than one stool per week.  Even if you have a regular bowel pattern at home, your normal regimen is likely to be disrupted due to multiple reasons following surgery.  Combination of anesthesia, postoperative narcotics, change in appetite and fluid intake all can affect your bowels.  ° °YOU MUST use at least one of the following options; they are listed in order of increasing strength to get the job done.  They are all available over the counter, and you may need to use some, POSSIBLY even all of these options:   ° °Drink plenty of fluids (prune juice may be helpful) and high fiber foods °Colace 100 mg by mouth twice a day  °Senokot for constipation as directed and as needed Dulcolax (bisacodyl), take  with full glass of water  °Miralax (polyethylene glycol) once or twice a day as needed. ° °If you have tried all these things and are unable to have a bowel movement in the first 3-4 days after surgery call either your surgeon or your primary doctor.   ° °If you experience loose stools or diarrhea, hold the medications until you stool forms back up.  If your symptoms do not get better within 1 week or if they get worse, check with your doctor.  If you experience "the worst abdominal pain ever" or develop nausea or vomiting, please contact the office immediately for further recommendations for treatment. ° ° °ITCHING:  If you experience itching with your medications, try taking only a single pain pill, or even half a pain pill at a time.  You can also use Benadryl over the   counter for itching or also to help with sleep.   TED HOSE STOCKINGS:  Use stockings on both legs until for at least 2 weeks or as directed by physician office. They may be removed at night for sleeping.  MEDICATIONS:  See your medication summary on the After Visit Summary that nursing will review with you.  You may have some home medications which will be placed on hold until you complete the course of blood thinner medication.  It is important for you to complete the blood thinner medication as prescribed.  PRECAUTIONS:  If you experience chest pain or shortness of breath - call 911 immediately for transfer to the hospital emergency department.   If you develop a fever greater that 101 F, purulent drainage from wound, increased redness or drainage from wound, foul odor from the wound/dressing, or calf pain - CONTACT YOUR SURGEON.                                                   FOLLOW-UP APPOINTMENTS:  If you do not already have a post-op appointment, please call the office for an appointment to be seen by your surgeon.  Guidelines for how soon to be seen are listed in your After Visit Summary, but are typically between 1-4 weeks  after surgery.  OTHER INSTRUCTIONS:   Knee Replacement:  Do not place pillow under knee, focus on keeping the knee straight while resting. CPM instructions: 0-90 degrees, 2 hours in the morning, 2 hours in the afternoon, and 2 hours in the evening. Place foam block, curve side up under heel at all times except when in CPM or when walking.  DO NOT modify, tear, cut, or change the foam block in any way.  MAKE SURE YOU:   Understand these instructions.   Get help right away if you are not doing well or get worse.    Thank you for letting us be a part of your medical care team.  It is a privilege we respect greatly.  We hope these instructions will help you stay on track for a fast and full recovery!    Dental Antibiotics:  In most cases prophylactic antibiotics for Dental procdeures after total joint surgery are not necessary.  Exceptions are as follows:  1. History of prior total joint infection  2. Severely immunocompromised (Organ Transplant, cancer chemotherapy, Rheumatoid biologic meds such as [place])  3. Poorly controlled diabetes (A1C &gt; 8.0, blood glucose over 200)  If you have one of these conditions, contact your surgeon for an antibiotic prescription, prior to your dental procedure.   Information on my medicine - ELIQUIS (apixaban)  Why was Eliquis prescribed for you? Eliquis was prescribed for you to reduce the risk of a blood clot forming that can cause a stroke if you have a medical condition called atrial fibrillation (a type of irregular heartbeat).  What do You need to know about Eliquis ? Take your Eliquis TWICE DAILY - one tablet in the morning and one tablet in the evening with or without food. If you have difficulty swallowing the tablet whole please discuss with your pharmacist how to take the medication safely.  Take Eliquis exactly as prescribed by your doctor and DO NOT stop taking Eliquis without talking to the doctor who prescribed the  medication.  Stopping may increase your risk of developing  a stroke.  Refill your prescription before you run out.  After discharge, you should have regular check-up appointments with your healthcare provider that is prescribing your Eliquis.  In the future your dose may need to be changed if your kidney function or weight changes by a significant amount or as you get older.  What do you do if you miss a dose? If you miss a dose, take it as soon as you remember on the same day and resume taking twice daily.  Do not take more than one dose of ELIQUIS at the same time to make up a missed dose.  Important Safety Information A possible side effect of Eliquis is bleeding. You should call your healthcare provider right away if you experience any of the following: ? Bleeding from an injury or your nose that does not stop. ? Unusual colored urine (red or dark brown) or unusual colored stools (red or black). ? Unusual bruising for unknown reasons. ? A serious fall or if you hit your head (even if there is no bleeding).  Some medicines may interact with Eliquis and might increase your risk of bleeding or clotting while on Eliquis. To help avoid this, consult your healthcare provider or pharmacist prior to using any new prescription or non-prescription medications, including herbals, vitamins, non-steroidal anti-inflammatory drugs (NSAIDs) and supplements.  This website has more information on Eliquis (apixaban): http://www.eliquis.com/eliquis/home

## 2020-11-24 NOTE — Plan of Care (Signed)
Alert and oriented x4. Patient with severe pain at beginning of shift, stated that pain has eased off a little bit and able to reposition in the bed. Ice pack to left hip, dressing clean intact and dry, no new drainage. IV fluids stopped due to pt drinking adequately. Pain managed with oxycodone and Robaxin. On scheduled tylenol. CHG bath completed, patient will have HD in am. Problem: Activity: Goal: Ability to avoid complications of mobility impairment will improve Outcome: Progressing Goal: Ability to tolerate increased activity will improve Outcome: Progressing   Problem: Education: Goal: Verbalization of understanding the information provided will improve Outcome: Progressing   Problem: Coping: Goal: Level of anxiety will decrease Outcome: Progressing   Problem: Physical Regulation: Goal: Postoperative complications will be avoided or minimized Outcome: Progressing   Problem: Respiratory: Goal: Ability to maintain a clear airway will improve Outcome: Progressing   Problem: Pain Management: Goal: Pain level will decrease Outcome: Progressing   Problem: Skin Integrity: Goal: Signs of wound healing will improve Outcome: Progressing   Problem: Tissue Perfusion: Goal: Ability to maintain adequate tissue perfusion will improve Outcome: Progressing   Problem: Education: Goal: Knowledge of General Education information will improve Description: Including pain rating scale, medication(s)/side effects and non-pharmacologic comfort measures Outcome: Progressing   Problem: Health Behavior/Discharge Planning: Goal: Ability to manage health-related needs will improve Outcome: Progressing   Problem: Clinical Measurements: Goal: Ability to maintain clinical measurements within normal limits will improve Outcome: Progressing Goal: Will remain free from infection Outcome: Progressing Goal: Diagnostic test results will improve Outcome: Progressing Goal: Respiratory complications  will improve Outcome: Progressing Goal: Cardiovascular complication will be avoided Outcome: Progressing   Problem: Activity: Goal: Risk for activity intolerance will decrease Outcome: Progressing   Problem: Nutrition: Goal: Adequate nutrition will be maintained Outcome: Progressing   Problem: Coping: Goal: Level of anxiety will decrease Outcome: Progressing   Problem: Elimination: Goal: Will not experience complications related to bowel motility Outcome: Progressing Goal: Will not experience complications related to urinary retention Outcome: Progressing   Problem: Pain Managment: Goal: General experience of comfort will improve Outcome: Progressing   Problem: Safety: Goal: Ability to remain free from injury will improve Outcome: Progressing   Problem: Skin Integrity: Goal: Risk for impaired skin integrity will decrease Outcome: Progressing

## 2020-11-25 DIAGNOSIS — D62 Acute posthemorrhagic anemia: Secondary | ICD-10-CM | POA: Diagnosis not present

## 2020-11-25 DIAGNOSIS — G4730 Sleep apnea, unspecified: Secondary | ICD-10-CM | POA: Diagnosis present

## 2020-11-25 DIAGNOSIS — N2581 Secondary hyperparathyroidism of renal origin: Secondary | ICD-10-CM | POA: Diagnosis not present

## 2020-11-25 DIAGNOSIS — E1122 Type 2 diabetes mellitus with diabetic chronic kidney disease: Secondary | ICD-10-CM | POA: Diagnosis not present

## 2020-11-25 DIAGNOSIS — R296 Repeated falls: Secondary | ICD-10-CM | POA: Diagnosis not present

## 2020-11-25 DIAGNOSIS — Z7901 Long term (current) use of anticoagulants: Secondary | ICD-10-CM | POA: Diagnosis not present

## 2020-11-25 DIAGNOSIS — E7849 Other hyperlipidemia: Secondary | ICD-10-CM | POA: Diagnosis not present

## 2020-11-25 DIAGNOSIS — Z888 Allergy status to other drugs, medicaments and biological substances status: Secondary | ICD-10-CM | POA: Diagnosis not present

## 2020-11-25 DIAGNOSIS — N186 End stage renal disease: Secondary | ICD-10-CM | POA: Diagnosis not present

## 2020-11-25 DIAGNOSIS — Z6838 Body mass index (BMI) 38.0-38.9, adult: Secondary | ICD-10-CM | POA: Diagnosis not present

## 2020-11-25 DIAGNOSIS — Z96642 Presence of left artificial hip joint: Secondary | ICD-10-CM | POA: Diagnosis not present

## 2020-11-25 DIAGNOSIS — Z7401 Bed confinement status: Secondary | ICD-10-CM | POA: Diagnosis not present

## 2020-11-25 DIAGNOSIS — U071 COVID-19: Secondary | ICD-10-CM | POA: Diagnosis not present

## 2020-11-25 DIAGNOSIS — E669 Obesity, unspecified: Secondary | ICD-10-CM | POA: Diagnosis not present

## 2020-11-25 DIAGNOSIS — I959 Hypotension, unspecified: Secondary | ICD-10-CM | POA: Diagnosis not present

## 2020-11-25 DIAGNOSIS — Z86718 Personal history of other venous thrombosis and embolism: Secondary | ICD-10-CM | POA: Diagnosis not present

## 2020-11-25 DIAGNOSIS — Z96641 Presence of right artificial hip joint: Secondary | ICD-10-CM | POA: Diagnosis present

## 2020-11-25 DIAGNOSIS — Z992 Dependence on renal dialysis: Secondary | ICD-10-CM | POA: Diagnosis not present

## 2020-11-25 DIAGNOSIS — I120 Hypertensive chronic kidney disease with stage 5 chronic kidney disease or end stage renal disease: Secondary | ICD-10-CM | POA: Diagnosis not present

## 2020-11-25 DIAGNOSIS — M1612 Unilateral primary osteoarthritis, left hip: Secondary | ICD-10-CM | POA: Diagnosis not present

## 2020-11-25 DIAGNOSIS — I480 Paroxysmal atrial fibrillation: Secondary | ICD-10-CM | POA: Diagnosis not present

## 2020-11-25 DIAGNOSIS — E11319 Type 2 diabetes mellitus with unspecified diabetic retinopathy without macular edema: Secondary | ICD-10-CM | POA: Diagnosis not present

## 2020-11-25 DIAGNOSIS — Z91013 Allergy to seafood: Secondary | ICD-10-CM | POA: Diagnosis not present

## 2020-11-25 DIAGNOSIS — Z87892 Personal history of anaphylaxis: Secondary | ICD-10-CM | POA: Diagnosis not present

## 2020-11-25 DIAGNOSIS — I5032 Chronic diastolic (congestive) heart failure: Secondary | ICD-10-CM | POA: Diagnosis not present

## 2020-11-25 DIAGNOSIS — M150 Primary generalized (osteo)arthritis: Secondary | ICD-10-CM | POA: Diagnosis not present

## 2020-11-25 DIAGNOSIS — J45909 Unspecified asthma, uncomplicated: Secondary | ICD-10-CM | POA: Diagnosis present

## 2020-11-25 DIAGNOSIS — Z20822 Contact with and (suspected) exposure to covid-19: Secondary | ICD-10-CM | POA: Diagnosis not present

## 2020-11-25 DIAGNOSIS — M2550 Pain in unspecified joint: Secondary | ICD-10-CM | POA: Diagnosis not present

## 2020-11-25 DIAGNOSIS — I319 Disease of pericardium, unspecified: Secondary | ICD-10-CM | POA: Diagnosis not present

## 2020-11-25 DIAGNOSIS — Z79899 Other long term (current) drug therapy: Secondary | ICD-10-CM | POA: Diagnosis not present

## 2020-11-25 DIAGNOSIS — I2584 Coronary atherosclerosis due to calcified coronary lesion: Secondary | ICD-10-CM | POA: Diagnosis not present

## 2020-11-25 DIAGNOSIS — I9589 Other hypotension: Secondary | ICD-10-CM | POA: Diagnosis not present

## 2020-11-25 DIAGNOSIS — I10 Essential (primary) hypertension: Secondary | ICD-10-CM | POA: Diagnosis not present

## 2020-11-25 DIAGNOSIS — E785 Hyperlipidemia, unspecified: Secondary | ICD-10-CM | POA: Diagnosis not present

## 2020-11-25 DIAGNOSIS — Z8701 Personal history of pneumonia (recurrent): Secondary | ICD-10-CM | POA: Diagnosis not present

## 2020-11-25 LAB — BASIC METABOLIC PANEL
Anion gap: 13 (ref 5–15)
BUN: 30 mg/dL — ABNORMAL HIGH (ref 8–23)
CO2: 27 mmol/L (ref 22–32)
Calcium: 8.9 mg/dL (ref 8.9–10.3)
Chloride: 94 mmol/L — ABNORMAL LOW (ref 98–111)
Creatinine, Ser: 6.74 mg/dL — ABNORMAL HIGH (ref 0.44–1.00)
GFR, Estimated: 6 mL/min — ABNORMAL LOW (ref >60)
Glucose, Bld: 124 mg/dL — ABNORMAL HIGH (ref 70–99)
Potassium: 3.6 mmol/L (ref 3.5–5.1)
Sodium: 134 mmol/L — ABNORMAL LOW (ref 135–145)

## 2020-11-25 LAB — CBC
HCT: 28.5 % — ABNORMAL LOW (ref 36.0–46.0)
Hemoglobin: 8.7 g/dL — ABNORMAL LOW (ref 12.0–15.0)
MCH: 30.5 pg (ref 26.0–34.0)
MCHC: 30.5 g/dL (ref 30.0–36.0)
MCV: 100 fL (ref 80.0–100.0)
Platelets: 159 10*3/uL (ref 150–400)
RBC: 2.85 MIL/uL — ABNORMAL LOW (ref 3.87–5.11)
RDW: 15.5 % (ref 11.5–15.5)
WBC: 16.3 10*3/uL — ABNORMAL HIGH (ref 4.0–10.5)
nRBC: 0.1 % (ref 0.0–0.2)

## 2020-11-25 NOTE — TOC Initial Note (Signed)
Transition of Care [facility]) - Initial/Assessment Note    Patient Details  Name: Brenda Arnold MRN: [id number] Date of Birth: December 23, 1955  [facility] CM/SW Contact:    Verdell Carmine, RN Phone Number: 11/25/2020, 11:16 AM  Clinical Narrative:                 Patient had hip replacement done. Patient has renal failure and is on hemodialysis. Due to the fact of this and that she lives alone,, PT recommended SNF for rehab post surgically. The patient agrees and FL2 done today. This RNCM attempted to speak to the patient about choices, however she was not in the room a thte time of hte call. Sent out to different agencies, will likely not start receiving replies until Sunday or Monday. Patient will continue to be in hte hospital working with PT . She is under oBs currently, attempted to call as well about obs status, will call patient back.   Expected Discharge Plan: Skilled Nursing Facility Barriers to Discharge: Continued Medical Work up   Patient Goals and CMS Choice        Expected Discharge Plan and Services Expected Discharge Plan: [place] In-house Referral: Clinical Social Work Discharge Planning Services: CM Consult   Living arrangements for the past 2 months: Brenda Arnold                                      Prior Living Arrangements/Services Living arrangements for the past 2 months: Single Family Home Lives with:: Self Patient language and need for interpreter reviewed:: Yes        Need for Family Participation in Patient Care: Yes (Comment) Care giver support system in place?: Yes (comment) Current home services: DME Criminal Activity/Legal Involvement Pertinent to Current Situation/Hospitalization: No - Comment as needed  Activities of Daily Living Home Assistive Devices/Equipment: Environmental consultant (specify type),Eyeglasses ADL Screening (condition at time of admission) Patient's cognitive ability adequate to safely  complete daily activities?: Yes Is the patient deaf or have difficulty hearing?: No Does the patient have difficulty seeing, even when wearing glasses/contacts?: No Does the patient have difficulty concentrating, remembering, or making decisions?: No Patient able to express need for assistance with ADLs?: Yes Does the patient have difficulty dressing or bathing?: No Independently performs ADLs?: Yes (appropriate for developmental age) Does the patient have difficulty walking or climbing stairs?: Yes Weakness of Legs: Left Weakness of Arms/Hands: None  Permission Sought/Granted                  Emotional Assessment       Orientation: : Oriented to Self,Oriented to Place,Oriented to  Time,Oriented to Situation Alcohol / Substance Use: Not Applicable Psych Involvement: No (comment)  Admission diagnosis:  Status post total replacement of left hip [O37.858] Patient Active Problem List   Diagnosis Date Noted  . Primary osteoarthritis of left hip 11/23/2020  . Status post total replacement of left hip 11/23/2020  . Diarrhea, unspecified 09/06/2020  . Headache, unspecified 07/11/2020  . Subacute osteomyelitis of right hand ([place]) 01/27/2020  . Finger infection 01/26/2020  . ESRD (end stage renal disease) ([place]) 01/13/2020  . ESRD on dialysis ([place])   . Physical debility 09/09/2019  . Pericardial effusion   . SOB (shortness of breath)   . Leukocytosis   . Anemia of chronic disease   . PAF (paroxysmal atrial fibrillation) ([place])   .  Postoperative pain   . Cardiac tamponade   . Chest pain at rest 08/26/2019  . Tachycardia 08/26/2019  . Pericarditis 08/26/2019  . Angina pectoris, unspecified ([place]) 08/25/2019  . CAD (coronary artery disease) 11/05/2018  . A-fib ([place]) 11/05/2018  . Atrial fibrillation with RVR ([place])   . Hypotension 11/04/2018  . ESRD on hemodialysis ([place]) 11/04/2018  . DVT (deep venous thrombosis) ([place]) 11/03/2018  . Hyperkalemia 10/02/2018  . Status post total  replacement of right hip 09/15/2018  . Varicose veins of bilateral lower extremities with other complications 80/16/5537  . Hypercalcemia 05/17/2017  . Secondary hyperparathyroidism of renal origin ([place]) 02/21/2017  . Pruritus, unspecified 02/13/2017  . Pain, unspecified 02/13/2017  . Other specified coagulation defects ([place]) 02/13/2017  . Iron deficiency anemia, unspecified 02/13/2017  . Anticoagulation goal of INR 2 to 3 09/30/2014  . Pain in lower limb 05/02/2014  . Onychomycosis due to dermatophyte 04/06/2013  . Pain in joint, ankle and foot 04/06/2013  . Bradycardia 11/06/2012  . Left-sided epistaxis 11/05/2012  . DM (diabetes mellitus) ([place]) 11/05/2012  . OSA (obstructive sleep apnea) 11/05/2012  . Acute posterior epistaxis 11/05/2012  . CKD (chronic kidney disease) 11/05/2012  . End stage renal disease ([place]) 06/25/2012  . Type 2 diabetes mellitus with complication, without long-term current use of insulin ([place]) 12/20/2009  . OBESITY 12/20/2009  . OBESITY-MORBID (>100') 12/20/2009  . Essential hypertension 12/20/2009  . Paroxysmal atrial fibrillation ([place]) 12/20/2009  . Chest pain 12/20/2009  . ABNORMAL CV (STRESS) TEST 12/20/2009   PCP:  Kelton Pillar, MD Pharmacy:   [id number] - [address], [place] [address] Phone: [phone] Fax: [phone]     Social Determinants of Health (SDOH) Interventions    Readmission Risk Interventions Readmission Risk Prevention Plan 08/30/2019  Transportation Screening Complete  PCP or Specialist Appt within 3-5 Days Complete  HRI or [place] Complete  Social Work Consult for [place] Planning/Counseling Complete  Palliative Care Screening Not Applicable  Medication Review Press photographer) Complete  Some recent data might be hidden

## 2020-11-25 NOTE — Progress Notes (Signed)
Pt is A&O x4. Her BP remains low but denies symptoms. Denies dizziness or light-headedness. Per pt, her BP remains low during hemodialysis and the day after dialysis for a year now.

## 2020-11-25 NOTE — Progress Notes (Signed)
[facility] Progress Note   Subjective: Seen in room, very pleasant. Says L hip replacement more painful than previous R hip replacement. Hoping to do rehab in house.    Objective Vitals:   11/24/20 1753 11/24/20 1957 11/25/20 0348 11/25/20 0800  BP: 95/63 (!) 81/52 (!) 90/47 (!) 81/42  Pulse: 99 100 80 85  Resp:  16 17 14   Temp: 98.5 F (36.9 C) 97.9 F (36.6 C) 97.7 F (36.5 C)   TempSrc: Oral Oral Oral   SpO2: 100% 100% 100% 100%  Weight:      Height:       Physical Exam General: Pleasant  WN female female in NAD Heart: S1,S2 RRR No M/R/G Lungs: CTAB Abdomen: Active BS Extremities: No LE edema Dialysis Access: L radiocephalic AVF aneurysmal, skin is thinning. Refer to [facility] as OP for possible plication.     Additional Objective Labs: Basic Metabolic Panel: Recent Labs  Lab 11/21/20 1611 11/23/20 1048 11/23/20 2029 11/24/20 0322  NA 141 139 139 137  K 4.3 3.8 3.9 5.2*  CL 95* 98 100 98  CO2 30  --  25 25  GLUCOSE 84 73 134* 145*  BUN 35* 36* 36* 44*  CREATININE 9.76* 8.90* 9.12* 10.16*  CALCIUM 9.5  --  9.1 9.1  PHOS  --   --  5.4*  --    Liver Function Tests: Recent Labs  Lab 11/21/20 1611 11/23/20 2029  AST 20  --   ALT 17  --   ALKPHOS 91  --   BILITOT 0.6  --   PROT 7.5  --   ALBUMIN 4.0 4.2   No results for input(s): LIPASE, AMYLASE in the last 168 hours. CBC: Recent Labs  Lab 11/21/20 1611 11/23/20 1048 11/23/20 2029 11/24/20 0322  WBC 8.8  --  13.1* 9.0  NEUTROABS 6.0  --   --   --   HGB 11.2* 13.3 10.5* 9.3*  HCT 37.7 39.0 32.5* 29.3*  MCV 100.3*  --  97.6 97.7  PLT 164  --  124* 134*   Blood Culture    Component Value Date/Time   SDES PERICARDIAL 08/31/2019 1429   SPECREQUEST NONE 08/31/2019 1429   CULT  08/31/2019 1429    No growth aerobically or anaerobically. Performed at [facility], [place] [address], [place], [place] [address]    REPTSTATUS 09/05/2019 FINAL 08/31/2019 1429    Cardiac  Enzymes: No results for input(s): CKTOTAL, CKMB, CKMBINDEX, TROPONINI in the last 168 hours. CBG: Recent Labs  Lab 11/21/20 1527 11/23/20 1035 11/23/20 1151 11/23/20 1230 11/23/20 1543  GLUCAP 68* 73 60* 102* 117*   Iron Studies: No results for input(s): IRON, TIBC, TRANSFERRIN, FERRITIN in the last 72 hours. @lablastinr3 @ Studies/Results: DG Pelvis Portable  Result Date: 11/23/2020 CLINICAL DATA:  Follow-up total hip replacement EXAM: PORTABLE PELVIS 1-2 VIEWS COMPARISON:  10/06/2020 FINDINGS: New total hip arthroplasty of the left hip. Components appear well positioned. No radiographically detectable complication. Previous total hip arthroplasty on the right. IMPRESSION: Good appearance following total hip arthroplasty on the left. Electronically Signed   By: Nelson Chimes M.D.   On: 11/23/2020 16:05   DG C-Arm 1-60 Min  Result Date: 11/23/2020 CLINICAL DATA:  ORIF left hip. EXAM: DG C-ARM 1-60 MIN FLUOROSCOPY TIME:  Fluoroscopy Time:  36 seconds Radiation:  4.55 mGy. COMPARISON:  October 06, 2020. FINDINGS: Four C-arm fluoroscopic images were obtained intraoperatively and submitted for post operative interpretation. These images demonstrate postsurgical changes of  left total hip arthroplasty. Partially imaged prior right total hip arthroplasty. Please see the performing provider's procedural report for further detail. IMPRESSION: Intraoperative fluoroscopic images, as detailed above. Electronically Signed   By: Margaretha Sheffield MD   On: 11/23/2020 15:22   DG HIP OPERATIVE UNILAT W OR W/O PELVIS LEFT  Result Date: 11/23/2020 CLINICAL DATA:  Left hip arthroplasty EXAM: OPERATIVE LEFT HIP (WITH PELVIS IF PERFORMED) 2 VIEWS TECHNIQUE: Fluoroscopic spot image(s) were submitted for interpretation post-operatively. COMPARISON:  10/06/2020 FINDINGS: Four fluoroscopic images are obtained during the performance of the procedure and are provided for interpretation only. Initial images demonstrate  severe left hip osteoarthritis. Subsequent images demonstrate placement of a left hip arthroplasty in the expected position without signs of acute complication. Stable right hip arthroplasty. FLUOROSCOPY TIME:  36 seconds IMPRESSION: 1. Left hip arthroplasty as above. Electronically Signed   By: Randa Ngo M.D.   On: 11/23/2020 15:22   Medications:  sodium chloride     sodium chloride     iron sucrose Stopped (11/24/20 1806)   methocarbamol (ROBAXIN) IV      apixaban  5 mg Oral BID   aspirin  81 mg Oral BID   calcitRIOL  2 mcg Oral Q M,W,F-HD   Chlorhexidine Gluconate Cloth  6 each Topical Q0600   colchicine  0.3 mg Oral Q M,W,F   darbepoetin (ARANESP) injection - DIALYSIS  40 mcg Intravenous Q Fri-HD   docusate sodium  100 mg Oral BID   gabapentin  100 mg Oral QHS   midodrine  10 mg Oral TID WC   oxyCODONE  10 mg Oral Q12H   polyethylene glycol  17 g Oral Daily   sucroferric oxyhydroxide  1,000 mg Oral TID with meals   sucroferric oxyhydroxide  500 mg Oral With snacks   Dialysis orders:  [facility] MWF 4 hrs EDW 105 kg F200 dialyzer 2K/ 2.25 Ca  BFR 350/ DFR 800 No UF profile No heparin Mircera 60 mcg q 2 weeks Venofer 100 mg q 8 times (ending 12/04/2020) Calcitriol 2.0 mcg q rx Parsabiv 12.5 mcg q rx  Assessment/Plan  1.  S/p L hip THA: did well.  Per ortho.  2.  ESRD on HD: [facility] 11/27/2020     3. Volume: appear euvolemic. BP soft overnight. On midodrine. HD 12/24. Net UF 1.6 post wt 106.8 kg. Still slightly above OP EDW. Does not appear volume overloaded by exam.  Was meeting EDW at [facility] but SBP very low. Allow EDW to drift up, hopefully will help with hypotension. UF as tolerated.   3.  Anemia: postop Hgb 9.5, continue Fe load and will give extra dose of Aranesp today  4.  BMM: parsabiv not on formulary, will give calcitriol with HD, continue binders. Labs at goal.   5.  Nutrition: Albumin at goal. Renal/Carb mod diet, renal vit/nepro  6.   Afib: Eliquis being restarted  7.  H/o pericarditis: on colchicine   8.  Dispo: pending Barney Gertsch H. Zionna Homewood NP-C 11/25/2020, 9:30 AM  [employer] [phone]

## 2020-11-25 NOTE — NC FL2 (Signed)
[place] LEVEL OF CARE SCREENING TOOL     IDENTIFICATION  Patient Name: Brenda Arnold Birthdate: 09-12-1956 Sex: female Admission Date (Current Location): 11/23/2020  [facility] and [place] Number:  Herbalist and Address:  [facility], [place] [address] [place], [place] [address]      Provider Number: [id number]  Attending Physician Name and Address:  Leandrew Koyanagi, MD  Relative Name and Phone Number:  Dell [place] friend [phone]    Current Level of Care: Hospital Recommended Level of Care: [place] Prior Approval Number:    Date Approved/Denied:   PASRR Number: [id number]  Discharge Plan: SNF    Current Diagnoses: Patient Active Problem List   Diagnosis Date Noted  . Primary osteoarthritis of left hip 11/23/2020  . Status post total replacement of left hip 11/23/2020  . Diarrhea, unspecified 09/06/2020  . Headache, unspecified 07/11/2020  . Subacute osteomyelitis of right hand ([place]) 01/27/2020  . Finger infection 01/26/2020  . ESRD (end stage renal disease) ([place]) 01/13/2020  . ESRD on dialysis ([place])   . Physical debility 09/09/2019  . Pericardial effusion   . SOB (shortness of breath)   . Leukocytosis   . Anemia of chronic disease   . PAF (paroxysmal atrial fibrillation) ([place])   . Postoperative pain   . Cardiac tamponade   . Chest pain at rest 08/26/2019  . Tachycardia 08/26/2019  . Pericarditis 08/26/2019  . Angina pectoris, unspecified ([place]) 08/25/2019  . CAD (coronary artery disease) 11/05/2018  . A-fib ([place]) 11/05/2018  . Atrial fibrillation with RVR ([place])   . Hypotension 11/04/2018  . ESRD on hemodialysis ([place]) 11/04/2018  . DVT (deep venous thrombosis) ([place]) 11/03/2018  . Hyperkalemia 10/02/2018  . Status post total replacement of right hip 09/15/2018  . Varicose veins of bilateral lower extremities with other complications 26/71/2458  . Hypercalcemia 05/17/2017  . Secondary  hyperparathyroidism of renal origin ([place]) 02/21/2017  . Pruritus, unspecified 02/13/2017  . Pain, unspecified 02/13/2017  . Other specified coagulation defects ([place]) 02/13/2017  . Iron deficiency anemia, unspecified 02/13/2017  . Anticoagulation goal of INR 2 to 3 09/30/2014  . Pain in lower limb 05/02/2014  . Onychomycosis due to dermatophyte 04/06/2013  . Pain in joint, ankle and foot 04/06/2013  . Bradycardia 11/06/2012  . Left-sided epistaxis 11/05/2012  . DM (diabetes mellitus) ([place]) 11/05/2012  . OSA (obstructive sleep apnea) 11/05/2012  . Acute posterior epistaxis 11/05/2012  . CKD (chronic kidney disease) 11/05/2012  . End stage renal disease ([place]) 06/25/2012  . Type 2 diabetes mellitus with complication, without long-term current use of insulin ([place]) 12/20/2009  . OBESITY 12/20/2009  . OBESITY-MORBID (>100') 12/20/2009  . Essential hypertension 12/20/2009  . Paroxysmal atrial fibrillation ([place]) 12/20/2009  . Chest pain 12/20/2009  . ABNORMAL CV (STRESS) TEST 12/20/2009    Orientation RESPIRATION BLADDER Height & Weight     Self,Time,Situation,Place  Normal Continent Weight: 106.8 kg Height:  5\' 6"  (167.6 cm)  BEHAVIORAL SYMPTOMS/MOOD NEUROLOGICAL BOWEL NUTRITION STATUS        Diet (see discharge notes)  AMBULATORY STATUS COMMUNICATION OF NEEDS Skin   Extensive Assist Verbally Surgical wounds (hip dressing dry)                       Personal Care Assistance Level of Assistance  Bathing,Dressing Bathing Assistance: Limited assistance   Dressing Assistance: Limited assistance     Functional Limitations Info  SPECIAL CARE FACTORS FREQUENCY  PT (By licensed PT),OT (By licensed OT)     PT Frequency: 5 x week OT Frequency: 5 x week            Contractures Contractures Info: Not present    Additional Factors Info  Code Status Code Status Info: Full Code             Current Medications (11/25/2020):  This is the current hospital  active medication list Current Facility-Administered Medications  Medication Dose Route Frequency Provider Last Rate Last Admin  . 0.9 %  sodium chloride infusion  100 mL Intravenous PRN Madelon Lips, MD      . 0.9 %  sodium chloride infusion  100 mL Intravenous PRN Madelon Lips, MD      . acetaminophen (TYLENOL) tablet 325-650 mg  325-650 mg Oral Q6H PRN Leandrew Koyanagi, MD      . alteplase (CATHFLO ACTIVASE) injection 2 mg  2 mg Intracatheter Once PRN Madelon Lips, MD      . alum & mag hydroxide-simeth (MAALOX/MYLANTA) 200-200-20 MG/5ML suspension 30 mL  30 mL Oral Q4H PRN Leandrew Koyanagi, MD      . apixaban Arne [place]) tablet 5 mg  5 mg Oral BID Leandrew Koyanagi, MD   5 mg at 11/25/20 4782  . aspirin chewable tablet 81 mg  81 mg Oral BID Leandrew Koyanagi, MD   81 mg at 11/25/20 9562  . calcitRIOL (ROCALTROL) capsule 2 mcg  2 mcg Oral Q M,W,F-HD Madelon Lips, MD   2 mcg at 11/24/20 1553  . Chlorhexidine Gluconate Cloth 2 % PADS 6 each  6 each Topical Q0600 Madelon Lips, MD   6 each at 11/24/20 0515  . colchicine tablet 0.3 mg  0.3 mg Oral Q M,W,F Leandrew Koyanagi, MD   0.3 mg at 11/24/20 0835  . Darbepoetin Alfa (ARANESP) injection 40 mcg  40 mcg Intravenous Q Janit Bern, MD   40 mcg at 11/24/20 1555  . diphenhydrAMINE (BENADRYL) 12.5 MG/5ML elixir 25 mg  25 mg Oral Q4H PRN Leandrew Koyanagi, MD   25 mg at 11/23/20 1708  . docusate sodium (COLACE) capsule 100 mg  100 mg Oral BID Leandrew Koyanagi, MD   100 mg at 11/25/20 1308  . gabapentin (NEURONTIN) capsule 100 mg  100 mg Oral QHS Leandrew Koyanagi, MD   100 mg at 11/24/20 2124  . heparin injection 1,000 Units  1,000 Units Dialysis PRN Madelon Lips, MD      . HYDROmorphone (DILAUDID) injection 0.5-1 mg  0.5-1 mg Intravenous Q4H PRN Leandrew Koyanagi, MD   1 mg at 11/23/20 1708  . iron sucrose (VENOFER) 100 mg in sodium chloride 0.9 % 100 mL IVPB  100 mg Intravenous Q M,W,F-HD Madelon Lips, MD   Stopped at 11/24/20 1806  . lidocaine  (PF) (XYLOCAINE) 1 % injection 5 mL  5 mL Intradermal PRN Madelon Lips, MD      . lidocaine-prilocaine (EMLA) cream 1 application  1 application Topical PRN Madelon Lips, MD      . menthol-cetylpyridinium (CEPACOL) lozenge 3 mg  1 lozenge Oral PRN Leandrew Koyanagi, MD       Or  . phenol (CHLORASEPTIC) mouth spray 1 spray  1 spray Mouth/Throat PRN Leandrew Koyanagi, MD      . methocarbamol (ROBAXIN) tablet 500 mg  500 mg Oral Q6H PRN Leandrew Koyanagi, MD   500 mg at 11/24/20 [phone]  Or  . methocarbamol (ROBAXIN) 500 mg in dextrose 5 % 50 mL IVPB  500 mg Intravenous Q6H PRN Leandrew Koyanagi, MD      . metoCLOPramide (REGLAN) tablet 5-10 mg  5-10 mg Oral Q8H PRN Leandrew Koyanagi, MD       Or  . metoCLOPramide (REGLAN) injection 5-10 mg  5-10 mg Intravenous Q8H PRN Leandrew Koyanagi, MD      . midodrine (PROAMATINE) tablet 10 mg  10 mg Oral TID WC Leandrew Koyanagi, MD   10 mg at 11/25/20 0802  . ondansetron (ZOFRAN) tablet 4 mg  4 mg Oral Q6H PRN Leandrew Koyanagi, MD       Or  . ondansetron [facility]) injection 4 mg  4 mg Intravenous Q6H PRN Leandrew Koyanagi, MD      . oxyCODONE (Oxy IR/ROXICODONE) immediate release tablet 10-15 mg  10-15 mg Oral Q4H PRN Leandrew Koyanagi, MD   10 mg at 11/24/20 0252  . oxyCODONE (Oxy IR/ROXICODONE) immediate release tablet 5-10 mg  5-10 mg Oral Q4H PRN Leandrew Koyanagi, MD   10 mg at 11/23/20 1820  . oxyCODONE (OXYCONTIN) 12 hr tablet 10 mg  10 mg Oral Q12H Leandrew Koyanagi, MD   10 mg at 11/25/20 0086  . pentafluoroprop-tetrafluoroeth (GEBAUERS) aerosol 1 application  1 application Topical PRN Madelon Lips, MD      . polyethylene glycol (MIRALAX / GLYCOLAX) packet 17 g  17 g Oral Daily Leandrew Koyanagi, MD   17 g at 11/25/20 0939  . sorbitol 70 % solution 30 mL  30 mL Oral Daily PRN Leandrew Koyanagi, MD      . sucroferric oxyhydroxide [facility]) chewable tablet 1,000 mg  1,000 mg Oral TID with meals Leandrew Koyanagi, MD   1,000 mg at 11/25/20 0802  . sucroferric oxyhydroxide (VELPHORO) chewable tablet  500 mg  500 mg Oral With snacks Leandrew Koyanagi, MD   500 mg at 11/25/20 7619     Discharge Medications: Please see discharge summary for a list of discharge medications.  Relevant Imaging Results:  Relevant Lab Results:   Additional Information Is a dialysis patient  Verdell Carmine, RN

## 2020-11-25 NOTE — Progress Notes (Signed)
Patient ID: Brenda Arnold, female   DOB: 11-08-1956, 64 y.o.   MRN: [id number] The patient is awake and alert at the bedside.  She denies any significant changes in her medical status of the last 24 hours.  She is very motivated.  Her left hip dressing is clean and dry.  Her leg lengths are equal.  She understands that short-term skilled nursing has been recommended due to her limited mobility and she agrees with this.  Her systolic blood pressure was a little low this morning.  I will likely repeat her CBC and BMET today.  Skilled nursing placement will likely not be until the first of the week due to this being a holiday weekend.

## 2020-11-25 NOTE — Progress Notes (Signed)
Physical Therapy Treatment Patient Details Name: Brenda Arnold MRN: [id number] DOB: Jul 02, 1956 Today's Date: 11/25/2020    History of Present Illness Patient is a 64 y/o female with PMH includes anemia, DM, ESRD on HD, HTN, hyperparathyroidism, a-fib, PCOS, sleep apnea. Patient s/p L THA on 12/23.    PT Comments    Patient progressing with mobility this session able to ambulate in the room, though likely developing some hypotension as ambulating since reported feeling weak and noted pt not responding as briskly.  Vitals checked once in recliner with feet elevated were WNL.  Feel she continues to benefit from skilled PT and remains appropriate for SNF level rehab at d/c.    Follow Up Recommendations  SNF     Equipment Recommendations  None recommended by PT    Recommendations for Other Services       Precautions / Restrictions Precautions Precautions: Fall Restrictions LLE Weight Bearing: Weight bearing as tolerated    Mobility  Bed Mobility Overal bed mobility: Needs Assistance Bed Mobility: Supine to Sit     Supine to sit: Min assist;HOB elevated     General bed mobility comments: using rails, slower due to pain  Transfers Overall transfer level: Needs assistance Equipment used: Rolling walker (2 wheeled) Transfers: Sit to/from Stand Sit to Stand: Min assist         General transfer comment: A for steadying due to pain, cues for hand placement  Ambulation/Gait Ambulation/Gait assistance: Min guard Gait Distance (Feet): 60 Feet Assistive device: Rolling walker (2 wheeled) Gait Pattern/deviations: Step-to pattern;Decreased stride length;Wide base of support;Shuffle     General Gait Details: cues for technique to get leg forward, pt with decreased responses with ambulation and reported feeling weakness,  after in chair vitals 96/45, HR 114, SpO2 100%.   Stairs             Wheelchair Mobility    Modified Rankin (Stroke Patients Only)        Balance Overall balance assessment: Needs assistance   Sitting balance-Leahy Scale: Good     Standing balance support: Bilateral upper extremity supported Standing balance-Leahy Scale: Poor Standing balance comment: UE support needed                            Cognition Arousal/Alertness: Awake/alert Behavior During Therapy: WFL for tasks assessed/performed Overall Cognitive Status: Within Functional Limits for tasks assessed                                        Exercises Total Joint Exercises Ankle Circles/Pumps: AROM;5 reps;Both;Supine Quad Sets: AROM;Both;Supine;5 reps Heel Slides: AAROM;Left;5 reps;Supine (using blanket to self assist)    General Comments        Pertinent Vitals/Pain Pain Score: 8  Pain Location: L hip Pain Descriptors / Indicators: Sore Pain Intervention(s): Monitored during session;Repositioned    Home Living                      Prior Function            PT Goals (current goals can now be found in the care plan section) Progress towards PT goals: Progressing toward goals    Frequency    7X/week      PT Plan Current plan remains appropriate    Co-evaluation  AM-PAC PT "6 Clicks" Mobility   Outcome Measure  Help needed turning from your back to your side while in a flat bed without using bedrails?: A Little Help needed moving from lying on your back to sitting on the side of a flat bed without using bedrails?: A Little Help needed moving to and from a bed to a chair (including a wheelchair)?: A Little Help needed standing up from a chair using your arms (e.g., wheelchair or bedside chair)?: A Little Help needed to walk in hospital room?: A Little Help needed climbing 3-5 steps with a railing? : A Lot 6 Click Score: 17    End of Session Equipment Utilized During Treatment: Gait belt Activity Tolerance: Patient limited by fatigue Patient left: in chair;with call bell/phone  within reach;with chair alarm set   PT Visit Diagnosis: Unsteadiness on feet (R26.81);Other abnormalities of gait and mobility (R26.89);Muscle weakness (generalized) (M62.81)     Time: 8270-7867 PT Time Calculation (min) (ACUTE ONLY): 24 min  Charges:  $Gait Training: 8-22 mins $Therapeutic Exercise: 8-22 mins                     Magda Kiel, PT Acute Rehabilitation Services [phone] [phone] 11/25/2020    Brenda Arnold 11/25/2020, 12:21 PM

## 2020-11-26 LAB — SARS CORONAVIRUS 2 BY RT PCR (HOSPITAL ORDER, PERFORMED IN ~~LOC~~ HOSPITAL LAB): SARS Coronavirus 2: NEGATIVE

## 2020-11-26 MED ORDER — HYDROCODONE-ACETAMINOPHEN 5-325 MG PO TABS
1.0000 | ORAL_TABLET | ORAL | Status: DC | PRN
  Administered 2020-11-27: 1 via ORAL
  Administered 2020-11-29: 2 via ORAL
  Filled 2020-11-26: qty 2
  Filled 2020-11-26: qty 1
  Filled 2020-11-26: qty 2

## 2020-11-26 MED ORDER — BISACODYL 5 MG PO TBEC
10.0000 mg | DELAYED_RELEASE_TABLET | Freq: Every day | ORAL | Status: DC | PRN
  Administered 2020-11-26 – 2020-11-27 (×2): 10 mg via ORAL
  Filled 2020-11-26 (×2): qty 2

## 2020-11-26 NOTE — Plan of Care (Signed)
Patient is alert and oriented x4. Patient c/o 7/10 left hip pain, prn oxycodone administered. Patient in no acute distress. Problem: Activity: Goal: Ability to avoid complications of mobility impairment will improve Outcome: Progressing Goal: Ability to tolerate increased activity will improve Outcome: Progressing   Problem: Education: Goal: Verbalization of understanding the information provided will improve Outcome: Progressing   Problem: Coping: Goal: Level of anxiety will decrease Outcome: Progressing   Problem: Physical Regulation: Goal: Postoperative complications will be avoided or minimized Outcome: Progressing   Problem: Respiratory: Goal: Ability to maintain a clear airway will improve Outcome: Progressing   Problem: Pain Management: Goal: Pain level will decrease Outcome: Progressing   Problem: Skin Integrity: Goal: Signs of wound healing will improve Outcome: Progressing   Problem: Tissue Perfusion: Goal: Ability to maintain adequate tissue perfusion will improve Outcome: Progressing   Problem: Education: Goal: Knowledge of General Education information will improve Description: Including pain rating scale, medication(s)/side effects and non-pharmacologic comfort measures Outcome: Progressing   Problem: Health Behavior/Discharge Planning: Goal: Ability to manage health-related needs will improve Outcome: Progressing   Problem: Clinical Measurements: Goal: Ability to maintain clinical measurements within normal limits will improve Outcome: Progressing Goal: Will remain free from infection Outcome: Progressing Goal: Diagnostic test results will improve Outcome: Progressing Goal: Respiratory complications will improve Outcome: Progressing Goal: Cardiovascular complication will be avoided Outcome: Progressing   Problem: Activity: Goal: Risk for activity intolerance will decrease Outcome: Progressing   Problem: Nutrition: Goal: Adequate nutrition  will be maintained Outcome: Progressing   Problem: Coping: Goal: Level of anxiety will decrease Outcome: Progressing   Problem: Elimination: Goal: Will not experience complications related to bowel motility Outcome: Progressing Goal: Will not experience complications related to urinary retention Outcome: Progressing   Problem: Pain Managment: Goal: General experience of comfort will improve Outcome: Progressing   Problem: Safety: Goal: Ability to remain free from injury will improve Outcome: Progressing   Problem: Skin Integrity: Goal: Risk for impaired skin integrity will decrease Outcome: Progressing

## 2020-11-26 NOTE — Progress Notes (Signed)
[facility] Progress Note   Subjective: Seen in room POD # 3. Up in chair, no C/Os. Plan now to DC to SNF-would prefer In pt rehab but says she will do what is needed to do. HD tomorrow on schedule.     Objective Vitals:   11/25/20 1458 11/25/20 1945 11/26/20 0415 11/26/20 0834  BP: (!) 83/56 (!) 104/44 (!) 91/47 (!) 89/58  Pulse: 86 88 71 81  Resp: 17 16 16 18   Temp: 98.2 F (36.8 C) 98.9 F (37.2 C) 98.2 F (36.8 C) 97.9 F (36.6 C)  TempSrc: Oral Oral Oral Oral  SpO2: 100% 98% 100% 98%  Weight:      Height:       Physical Exam General: Pleasant  WN female female in NAD Heart: S1,S2 RRR No M/R/G Lungs: CTAB Abdomen: Active BS Extremities: No LE edema Dialysis Access: L radiocephalic AVF aneurysmal, skin is thinning. Refer to [facility] as OP for possible plication.    Additional Objective Labs: Basic Metabolic Panel: Recent Labs  Lab 11/23/20 2029 11/24/20 0322 11/25/20 0905  NA 139 137 134*  K 3.9 5.2* 3.6  CL 100 98 94*  CO2 25 25 27   GLUCOSE 134* 145* 124*  BUN 36* 44* 30*  CREATININE 9.12* 10.16* 6.74*  CALCIUM 9.1 9.1 8.9  PHOS 5.4*  --   --    Liver Function Tests: Recent Labs  Lab 11/21/20 1611 11/23/20 2029  AST 20  --   ALT 17  --   ALKPHOS 91  --   BILITOT 0.6  --   PROT 7.5  --   ALBUMIN 4.0 4.2   No results for input(s): LIPASE, AMYLASE in the last 168 hours. CBC: Recent Labs  Lab 11/21/20 1611 11/23/20 1048 11/23/20 2029 11/24/20 0322 11/25/20 0905  WBC 8.8  --  13.1* 9.0 16.3*  NEUTROABS 6.0  --   --   --   --   HGB 11.2*   < > 10.5* 9.3* 8.7*  HCT 37.7   < > 32.5* 29.3* 28.5*  MCV 100.3*  --  97.6 97.7 100.0  PLT 164  --  124* 134* 159   < > = values in this interval not displayed.   Blood Culture    Component Value Date/Time   SDES PERICARDIAL 08/31/2019 1429   SPECREQUEST NONE 08/31/2019 1429   CULT  08/31/2019 1429    No growth aerobically or anaerobically. Performed at [facility], [place]  [address] [place], [place] [address]    REPTSTATUS 09/05/2019 FINAL 08/31/2019 1429    Cardiac Enzymes: No results for input(s): CKTOTAL, CKMB, CKMBINDEX, TROPONINI in the last 168 hours. CBG: Recent Labs  Lab 11/21/20 1527 11/23/20 1035 11/23/20 1151 11/23/20 1230 11/23/20 1543  GLUCAP 68* 73 60* 102* 117*   Iron Studies: No results for input(s): IRON, TIBC, TRANSFERRIN, FERRITIN in the last 72 hours. @lablastinr3 @ Studies/Results: No results found. Medications: . sodium chloride    . sodium chloride    . iron sucrose Stopped (11/24/20 1806)  . methocarbamol (ROBAXIN) IV     . apixaban  5 mg Oral BID  . aspirin  81 mg Oral BID  . calcitRIOL  2 mcg Oral Q M,W,F-HD  . Chlorhexidine Gluconate Cloth  6 each Topical Q0600  . colchicine  0.3 mg Oral Q M,W,F  . darbepoetin (ARANESP) injection - DIALYSIS  40 mcg Intravenous Q Fri-HD  . docusate sodium  100 mg Oral BID  . gabapentin  100  mg Oral QHS  . midodrine  10 mg Oral TID WC  . oxyCODONE  10 mg Oral Q12H  . polyethylene glycol  17 g Oral Daily  . sucroferric oxyhydroxide  1,000 mg Oral TID with meals  . sucroferric oxyhydroxide  500 mg Oral With snacks     Dialysis orders: [facility] MWF 4 hrs EDW 105 kg F200 dialyzer 2K/ 2.25 Ca BFR 350/ DFR 800 -No heparin -Mircera 60 mcg q 2 weeks -Venofer 100 mg q 8 times (ending 12/04/2020) -Calcitriol 2.0 mcg q rx -Parsabiv 12.5 mcg q rx (not available in hospital.   Assessment/Plan  1. S/p L hip THA 11/23/2020: POD # 3 did well. Plans for OP rehab at SNF. Per primary/Ortho.   2. ESRD on HD: Next HD 11/27/2020. No Heparin.   3. Volume: appear euvolemic. BP soft overnight. On midodrine. HD 12/24. Net UF 1.6 post wt 106.8 kg. Still slightly above OP EDW. Does not appear volume overloaded by exam.  Was meeting EDW at [place] [facility] but SBP very low. Allow EDW to drift up, hopefully will help with hypotension. UF as tolerated.   3. Anemia: postop Hgb 8.7, continue Fe load. Aranesp 40  mcg IV 11/24/2020. Transfuse if needed.   4. BMM: parsabiv not on formulary, will give calcitriol with HD, continue binders. Labs at goal.   5. Nutrition: Albumin at goal. Renal/Carb mod diet, renal vit/nepro  6. Afib: Eliquis being restarted  7. H/o pericarditis: on colchicine   Joselyne Spake H. Saintclair Schroader NP-C 11/26/2020, 10:59 AM  [employer] [phone]

## 2020-11-26 NOTE — Progress Notes (Signed)
Patient ID: Brenda Arnold, female   DOB: [birth year], 64 y.o.   MRN: [id number] The patient is up and ambulating with therapy right now.  She reports that she is doing well.  She still does need short-term skilled nursing.  I will order a COVID-19 test today in anticipation of her being discharged potentially tomorrow to short-term skilled nursing.  Her left hip dressing is clean.  Her blood pressure runs low but this is more a chronic issue for her.  She is a dialysis patient.  Her hemoglobin is 8.7 which is acute on chronic anemia.

## 2020-11-26 NOTE — Progress Notes (Signed)
Physical Therapy Treatment Patient Details Name: Brenda Arnold MRN: [id number] DOB: 1956/05/06 Today's Date: 11/26/2020    History of Present Illness Patient is a 64 y/o female with PMH includes anemia, DM, ESRD on HD, HTN, hyperparathyroidism, a-fib, PCOS, sleep apnea. Patient s/p L THA on 12/23.    PT Comments    Pt progressing with mobility. DC to SNF for rehab is still appropriate as she lives alone and is still requiring physical assist with mobility.       Follow Up Recommendations  SNF     Equipment Recommendations  None recommended by PT    Recommendations for Other Services       Precautions / Restrictions Precautions Precautions: Fall Restrictions Weight Bearing Restrictions: Yes LLE Weight Bearing: Weight bearing as tolerated    Mobility  Bed Mobility Overal bed mobility: Needs Assistance Bed Mobility: Supine to Sit     Supine to sit: Min assist;HOB elevated     General bed mobility comments: used rails, increased time required  Transfers Overall transfer level: Needs assistance Equipment used: Rolling walker (2 wheeled) Transfers: Sit to/from Stand Sit to Stand: Min assist         General transfer comment: Cues for hand placement  Ambulation/Gait Ambulation/Gait assistance: Min guard Gait Distance (Feet): 60 Feet Assistive device: Rolling walker (2 wheeled) Gait Pattern/deviations: Step-to pattern;Decreased stride length Gait velocity: decreased   General Gait Details:  (Pt with difficulty advancing LLE at first with gait, but this improved as she ambulated further. no reports of dizziness today with gait)   Stairs             Wheelchair Mobility    Modified Rankin (Stroke Patients Only)       Balance                                            Cognition Arousal/Alertness: Awake/alert Behavior During Therapy: WFL for tasks assessed/performed Overall Cognitive Status: Within Functional Limits for  tasks assessed                                        Exercises Total Joint Exercises Ankle Circles/Pumps: AROM;Both;Supine;10 reps Heel Slides: AAROM;Left;Supine;10 reps Hip ABduction/ADduction: AAROM;Left;10 reps;Supine Long Arc Quad: AROM;Left;10 reps;Seated    General Comments General comments (skin integrity, edema, etc.): no family present.      Pertinent Vitals/Pain Pain Assessment: 0-10 Pain Score: 5  Pain Location: L hip Pain Descriptors / Indicators: Sore Pain Intervention(s): Limited activity within patient's tolerance;Monitored during session    Home Living                      Prior Function            PT Goals (current goals can now be found in the care plan section) Progress towards PT goals: Progressing toward goals    Frequency    7X/week      PT Plan Current plan remains appropriate    Co-evaluation              AM-PAC PT "6 Clicks" Mobility   Outcome Measure  Help needed turning from your back to your side while in a flat bed without using bedrails?: A Lot Help needed moving from lying on your back to  sitting on the side of a flat bed without using bedrails?: A Lot Help needed moving to and from a bed to a chair (including a wheelchair)?: A Little Help needed standing up from a chair using your arms (e.g., wheelchair or bedside chair)?: A Little Help needed to walk in hospital room?: A Little Help needed climbing 3-5 steps with a railing? : A Lot 6 Click Score: 15    End of Session Equipment Utilized During Treatment: Gait belt Activity Tolerance: Patient tolerated treatment well Patient left: in chair;with call bell/phone within reach;with chair alarm set   PT Visit Diagnosis: Unsteadiness on feet (R26.81);Other abnormalities of gait and mobility (R26.89);Muscle weakness (generalized) (M62.81)     Time: 6837-2902 PT Time Calculation (min) (ACUTE ONLY): 26 min  Charges:  $Gait Training: 8-22  mins $Therapeutic Exercise: 8-22 mins                     Lavonia Dana, PT   Acute Rehabilitation Services  Pager [phone] Office [phone] 11/26/2020    Melvern Banker 11/26/2020, 10:36 AM

## 2020-11-27 ENCOUNTER — Encounter (HOSPITAL_COMMUNITY): Payer: Self-pay | Admitting: Orthopaedic Surgery

## 2020-11-27 LAB — RENAL FUNCTION PANEL
Albumin: 2.7 g/dL — ABNORMAL LOW (ref 3.5–5.0)
Anion gap: 16 — ABNORMAL HIGH (ref 5–15)
BUN: 75 mg/dL — ABNORMAL HIGH (ref 8–23)
CO2: 26 mmol/L (ref 22–32)
Calcium: 9.7 mg/dL (ref 8.9–10.3)
Chloride: 95 mmol/L — ABNORMAL LOW (ref 98–111)
Creatinine, Ser: 11.54 mg/dL — ABNORMAL HIGH (ref 0.44–1.00)
GFR, Estimated: 3 mL/min — ABNORMAL LOW (ref >60)
Glucose, Bld: 128 mg/dL — ABNORMAL HIGH (ref 70–99)
Phosphorus: 4.3 mg/dL (ref 2.5–4.6)
Potassium: 3.9 mmol/L (ref 3.5–5.1)
Sodium: 137 mmol/L (ref 135–145)

## 2020-11-27 LAB — CBC
HCT: 27.4 % — ABNORMAL LOW (ref 36.0–46.0)
Hemoglobin: 8.3 g/dL — ABNORMAL LOW (ref 12.0–15.0)
MCH: 30.2 pg (ref 26.0–34.0)
MCHC: 30.3 g/dL (ref 30.0–36.0)
MCV: 99.6 fL (ref 80.0–100.0)
Platelets: 244 10*3/uL (ref 150–400)
RBC: 2.75 MIL/uL — ABNORMAL LOW (ref 3.87–5.11)
RDW: 15.9 % — ABNORMAL HIGH (ref 11.5–15.5)
WBC: 14.1 10*3/uL — ABNORMAL HIGH (ref 4.0–10.5)
nRBC: 0.2 % (ref 0.0–0.2)

## 2020-11-27 MED ORDER — SODIUM CHLORIDE 0.9 % IV SOLN: 100.0000 mL | INTRAVENOUS | Status: DC | PRN

## 2020-11-27 MED ORDER — MIDODRINE HCL 5 MG PO TABS
ORAL_TABLET | ORAL | Status: AC
  Filled 2020-11-27: qty 2

## 2020-11-27 MED ORDER — CALCITRIOL 0.5 MCG PO CAPS
ORAL_CAPSULE | ORAL | Status: AC
  Administered 2020-11-27: 0.5 ug
  Filled 2020-11-27: qty 2

## 2020-11-27 MED ORDER — SORBITOL 70 % SOLN
30.0000 mL | Freq: Every day | Status: DC | PRN
  Filled 2020-11-27: qty 30

## 2020-11-27 MED ORDER — CALCITRIOL 0.5 MCG PO CAPS
ORAL_CAPSULE | ORAL | Status: AC
  Administered 2020-11-27: 2 ug
  Filled 2020-11-27: qty 2

## 2020-11-27 NOTE — Progress Notes (Signed)
Physical Therapy Treatment Patient Details Name: Brenda Arnold MRN: [id number] DOB: May 05, 1956 Today's Date: 11/27/2020    History of Present Illness Patient is a 64 y/o female with PMH includes anemia, DM, ESRD on HD, HTN, hyperparathyroidism, a-fib, PCOS, sleep apnea. Patient s/p L THA on 12/23.    PT Comments    Patient with increased fatigue this session due to recent HD this AM. Patient ambulated 70' with RW and min guard for safety, difficulty advancing L LE initially but improved with further ambulation. Patient continues to be limited by pain, decreased activity tolerance, impaired balance, generalized weakness. Continue to recommend SNF for ongoing Physical Therapy.       Follow Up Recommendations  SNF     Equipment Recommendations  None recommended by PT    Recommendations for Other Services       Precautions / Restrictions Precautions Precautions: Fall Restrictions Weight Bearing Restrictions: Yes LLE Weight Bearing: Weight bearing as tolerated    Mobility  Bed Mobility Overal bed mobility: Needs Assistance Bed Mobility: Supine to Sit     Supine to sit: Supervision;HOB elevated Sit to supine: Min assist   General bed mobility comments: minA for L LE advancement onto bed  Transfers Overall transfer level: Needs assistance Equipment used: Rolling Tayten Bergdoll (2 wheeled) Transfers: Sit to/from Stand Sit to Stand: Min assist            Ambulation/Gait Ambulation/Gait assistance: Min guard Gait Distance (Feet): 75 Feet Assistive device: Rolling Jaquarius Seder (2 wheeled) Gait Pattern/deviations: Step-to pattern;Decreased stride length Gait velocity: decreased   General Gait Details: Patient with difficulty advancing L LE foward initially, but improved as she ambulated further   Stairs             Wheelchair Mobility    Modified Rankin (Stroke Patients Only)       Balance Overall balance assessment: Needs assistance Sitting-balance support:  No upper extremity supported;Feet supported Sitting balance-Leahy Scale: Good     Standing balance support: Bilateral upper extremity supported Standing balance-Leahy Scale: Poor Standing balance comment: reliant on UE support                            Cognition Arousal/Alertness: Awake/alert Behavior During Therapy: WFL for tasks assessed/performed Overall Cognitive Status: Within Functional Limits for tasks assessed                                        Exercises      General Comments        Pertinent Vitals/Pain Pain Assessment: Faces Faces Pain Scale: Hurts little more Pain Location: L hip Pain Descriptors / Indicators: Sore Pain Intervention(s): Monitored during session    Home Living                      Prior Function            PT Goals (current goals can now be found in the care plan section) Acute Rehab PT Goals Patient Stated Goal: to be independent PT Goal Formulation: With patient Time For Goal Achievement: 12/08/20 Potential to Achieve Goals: Good Progress towards PT goals: Progressing toward goals    Frequency    7X/week      PT Plan Current plan remains appropriate    Co-evaluation  AM-PAC PT "6 Clicks" Mobility   Outcome Measure  Help needed turning from your back to your side while in a flat bed without using bedrails?: A Little Help needed moving from lying on your back to sitting on the side of a flat bed without using bedrails?: A Little Help needed moving to and from a bed to a chair (including a wheelchair)?: A Little Help needed standing up from a chair using your arms (e.g., wheelchair or bedside chair)?: A Little Help needed to walk in hospital room?: A Little Help needed climbing 3-5 steps with a railing? : A Lot 6 Click Score: 17    End of Session Equipment Utilized During Treatment: Gait belt Activity Tolerance: Patient tolerated treatment well Patient left: in  bed;with call bell/phone within reach Nurse Communication: Mobility status PT Visit Diagnosis: Unsteadiness on feet (R26.81);Other abnormalities of gait and mobility (R26.89);Muscle weakness (generalized) (M62.81)     Time: 7215-8727 PT Time Calculation (min) (ACUTE ONLY): 16 min  Charges:  $Therapeutic Activity: 8-22 mins                     Perrin Maltese, PT, DPT Acute Rehabilitation Services Pager [phone] Office [phone]    Brenda Arnold 11/27/2020, 2:23 PM

## 2020-11-27 NOTE — Progress Notes (Signed)
Patient off unit for dialysis.

## 2020-11-27 NOTE — Progress Notes (Signed)
Subjective: 4 Days Post-Op Procedure(s) (LRB): LEFT TOTAL HIP ARTHROPLASTY ANTERIOR APPROACH (Left) Patient reports pain as mild. Denies any chest pain/pressure/palpitations, sob, nausea/vomiting.  Progressing with PT.     Objective: Vital signs in last 24 hours: Temp:  [97.9 F (36.6 C)-99.5 F (37.5 C)] 98 F (36.7 C) (12/27 0509) Pulse Rate:  [75-89] 75 (12/27 0509) Resp:  [16-18] 18 (12/27 0509) BP: (89-110)/(44-58) 95/48 (12/27 0509) SpO2:  [98 %-100 %] 100 % (12/27 0509)  Intake/Output from previous day: 12/26 0701 - 12/27 0700 In: 480 [P.O.:480] Out: 0  Intake/Output this shift: No intake/output data recorded.  Recent Labs    11/25/20 0905  HGB 8.7*   Recent Labs    11/25/20 0905  WBC 16.3*  RBC 2.85*  HCT 28.5*  PLT 159   Recent Labs    11/25/20 0905  NA 134*  K 3.6  CL 94*  CO2 27  BUN 30*  CREATININE 6.74*  GLUCOSE 124*  CALCIUM 8.9   No results for input(s): LABPT, INR in the last 72 hours.  Neurologically intact Neurovascular intact Sensation intact distally Intact pulses distally Dorsiflexion/Plantar flexion intact Incision: dressing C/D/I No cellulitis present Compartment soft   Assessment/Plan: 4 Days Post-Op Procedure(s) (LRB): LEFT TOTAL HIP ARTHROPLASTY ANTERIOR APPROACH (Left) Advance diet Up with therapy D/C IV fluids Discharge to SNF once bed available.  Will need to coordinate with hemodialysis as she has this every m/w/f.   D/c to snf covid test negative yesterday am.  Placed new order but nurse states will not utilize unless time limit from previous test expires prior to d/c WBAT LLE ABLA- no new symptoms.  BP still soft but trending upwards.   F/u with Dr. Erlinda Hong 2 weeks po      Aundra Dubin 11/27/2020, 7:24 AM

## 2020-11-27 NOTE — Progress Notes (Signed)
Patient returned from dialysis NADN.

## 2020-11-27 NOTE — Progress Notes (Signed)
[facility] Progress Note   Subjective:  Seen on HD - 1.5L net UF and tolerating. She is chronically hypotensive, on midodrine. Denies CP/dyspnea. Plan is to discharge to SNF/rehab soon.  Objective Vitals:   11/26/20 0834 11/26/20 1710 11/26/20 2012 11/27/20 0509  BP: (!) 89/58 (!) 110/57 (!) 89/44 (!) 95/48  Pulse: 81 77 89 75  Resp: 18 16 16 18   Temp: 97.9 F (36.6 C) 98.9 F (37.2 C) 99.5 F (37.5 C) 98 F (36.7 C)  TempSrc: Oral Oral Oral Oral  SpO2: 98% 100% 100% 100%  Weight:      Height:       Physical Exam General: Well appearing, NAD Heart: RRR; no murmur Lungs: CTA anteriorly Abdomen: soft Extremities: No LE edema Dialysis Access: L AVF + thrill  Additional Objective Labs: Basic Metabolic Panel: Recent Labs  Lab 11/23/20 2029 11/24/20 0322 11/25/20 0905  NA 139 137 134*  K 3.9 5.2* 3.6  CL 100 98 94*  CO2 25 25 27   GLUCOSE 134* 145* 124*  BUN 36* 44* 30*  CREATININE 9.12* 10.16* 6.74*  CALCIUM 9.1 9.1 8.9  PHOS 5.4*  --   --    Liver Function Tests: Recent Labs  Lab 11/21/20 1611 11/23/20 2029  AST 20  --   ALT 17  --   ALKPHOS 91  --   BILITOT 0.6  --   PROT 7.5  --   ALBUMIN 4.0 4.2   CBC: Recent Labs  Lab 11/21/20 1611 11/23/20 1048 11/23/20 2029 11/24/20 0322 11/25/20 0905  WBC 8.8  --  13.1* 9.0 16.3*  NEUTROABS 6.0  --   --   --   --   HGB 11.2*   < > 10.5* 9.3* 8.7*  HCT 37.7   < > 32.5* 29.3* 28.5*  MCV 100.3*  --  97.6 97.7 100.0  PLT 164  --  124* 134* 159   < > = values in this interval not displayed.   CBG: Recent Labs  Lab 11/21/20 1527 11/23/20 1035 11/23/20 1151 11/23/20 1230 11/23/20 1543  GLUCAP 68* 73 60* 102* 117*   Medications: . sodium chloride    . sodium chloride    . sodium chloride    . sodium chloride    . iron sucrose Stopped (11/24/20 1806)  . methocarbamol (ROBAXIN) IV     . apixaban  5 mg Oral BID  . aspirin  81 mg Oral BID  . calcitRIOL  2 mcg Oral Q M,W,F-HD  .  Chlorhexidine Gluconate Cloth  6 each Topical Q0600  . colchicine  0.3 mg Oral Q M,W,F  . darbepoetin (ARANESP) injection - DIALYSIS  40 mcg Intravenous Q Fri-HD  . docusate sodium  100 mg Oral BID  . gabapentin  100 mg Oral QHS  . midodrine  10 mg Oral TID WC  . polyethylene glycol  17 g Oral Daily  . sucroferric oxyhydroxide  1,000 mg Oral TID with meals  . sucroferric oxyhydroxide  500 mg Oral With snacks    Dialysis Orders: [facility] MWF 4 hrs EDW 105 kg F200 dialyzer 2K/ 2.25 Ca BFR 350/ DFR 800 No heparin -Mircera 60 mcg q 2 weeks -Venofer 100 mg q 8 times (ending 12/04/2020) -Calcitriol 2.0 mcg q rx -Parsabiv 12.5 mcg q rx (not available in hospital)   Assessment/Plan  1. S/p L hip THA 11/23/2020: Pain improving - for OP/SNF rehab soon. Per primary/Ortho.   2. ESRD: Continue HD on MWF schedule - HD  now.  3. Volume: Chronically hypotensive, on midodrine. Low UF goal today - may need to [facility] EDW.  4. Anemia: postop Hgb 8.7, continue Fe load. Aranesp 40 mcg IV 11/24/2020. Transfuse if needed.   5. BMM: parsabiv not on formulary, will give calcitriol with HD, continue binders. Labs at goal.  6. Nutrition:Albumin at goal. Renal/Carb mod diet, renal vit/nepro  7. Afib: Eliquis resumed.  8. Hx pericarditis: on colchicine    Veneta Penton, PA-C 11/27/2020, 8:10 AM  [employer]

## 2020-11-27 NOTE — TOC Progression Note (Addendum)
Transition of Care [facility]) - Progression Note    Patient Details  Name: Brenda Arnold MRN: [id number] Date of Birth: 1956-08-01  Transition [facility] CM/SW [place], [place] Phone Number: 11/27/2020, 4:21 PM  Clinical Narrative:    CSW gave bed offers to pt and encouraged her to look at the medicare website for further Information. TOC will need to get choice and confirm w/ facility. Covid test is still good. SW will continue to follow for transitions of care. Expected Discharge Plan: [place] Barriers to Discharge: Continued Medical Work up  Expected Discharge Plan and Services Expected Discharge Plan: [place] In-house Referral: Clinical Social Work Discharge Planning Services: CM Consult   Living arrangements for the past 2 months: Single Family Home Expected Discharge Date: 11/27/20                                     Social Determinants of Health (SDOH) Interventions    Readmission Risk Interventions Readmission Risk Prevention Plan 08/30/2019  Transportation Screening Complete  PCP or Specialist Appt within 3-5 Days Complete  HRI or [place] Complete  Social Work Consult for [place] Planning/Counseling Complete  Palliative Care Screening Not Applicable  Medication Review [employer]) Complete  Some recent data might be hidden

## 2020-11-28 NOTE — Progress Notes (Signed)
[place] [facility] Progress Note   Subjective:  Seen in room - no overnight issues. Plan is d/c to SNF possibly today for ongoing rehab for hip. Eating ice - cautioned on fluids.  Objective Vitals:   11/27/20 1145 11/27/20 1200 11/27/20 1900 11/28/20 0511  BP: (!) 114/42 (!) 127/44 (!) 97/52 (!) 90/49  Pulse: 64 68 70 67  Resp:  17 16 17   Temp: 98.4 F (36.9 C) 98.4 F (36.9 C) 99.5 F (37.5 C) 99.1 F (37.3 C)  TempSrc: Oral Oral Oral Oral  SpO2:  98% 98% 100%  Weight:  108 kg    Height:       Physical Exam General: Well appearing, NAD Heart: RRR; no murmur Lungs: CTA anteriorly Abdomen: soft Extremities: No LE edema Dialysis Access: L AVF + thrill  Additional Objective Labs: Basic Metabolic Panel: Recent Labs  Lab 11/23/20 2029 11/24/20 0322 11/25/20 0905 11/27/20 0813  NA 139 137 134* 137  K 3.9 5.2* 3.6 3.9  CL 100 98 94* 95*  CO2 25 25 27 26   GLUCOSE 134* 145* 124* 128*  BUN 36* 44* 30* 75*  CREATININE 9.12* 10.16* 6.74* 11.54*  CALCIUM 9.1 9.1 8.9 9.7  PHOS 5.4*  --   --  4.3   Liver Function Tests: Recent Labs  Lab 11/21/20 1611 11/23/20 2029 11/27/20 0813  AST 20  --   --   ALT 17  --   --   ALKPHOS 91  --   --   BILITOT 0.6  --   --   PROT 7.5  --   --   ALBUMIN 4.0 4.2 2.7*   CBC: Recent Labs  Lab 11/21/20 1611 11/23/20 1048 11/23/20 2029 11/24/20 0322 11/25/20 0905 11/27/20 0813  WBC 8.8  --  13.1* 9.0 16.3* 14.1*  NEUTROABS 6.0  --   --   --   --   --   HGB 11.2*   < > 10.5* 9.3* 8.7* 8.3*  HCT 37.7   < > 32.5* 29.3* 28.5* 27.4*  MCV 100.3*  --  97.6 97.7 100.0 99.6  PLT 164  --  124* 134* 159 244   < > = values in this interval not displayed.   Medications: . sodium chloride    . sodium chloride    . iron sucrose Stopped (11/27/20 1201)  . methocarbamol (ROBAXIN) IV     . apixaban  5 mg Oral BID  . aspirin  81 mg Oral BID  . calcitRIOL  2 mcg Oral Q M,W,F-HD  . Chlorhexidine Gluconate Cloth  6 each Topical  Q0600  . colchicine  0.3 mg Oral Q M,W,F  . darbepoetin (ARANESP) injection - DIALYSIS  40 mcg Intravenous Q Fri-HD  . docusate sodium  100 mg Oral BID  . gabapentin  100 mg Oral QHS  . midodrine  10 mg Oral TID WC  . polyethylene glycol  17 g Oral Daily  . sucroferric oxyhydroxide  1,000 mg Oral TID with meals  . sucroferric oxyhydroxide  500 mg Oral With snacks    Dialysis Orders: [facility] MWF 4 hrs EDW 105 kg F200 dialyzer 2K/ 2.25 Ca BFR 350/ DFR 800 No heparin -Mircera 60 mcg q 2 weeks -Venofer 100 mg q 8 times (ending 12/04/2020) -Calcitriol 2.0 mcg q rx -Parsabiv 12.5 mcg q rx(not available in hospital)  Assessment/Plan  1. S/p L hip THA12/23/2021:Pain improving - for OP/SNF rehab soon. Per primary/Ortho.  2. ESRD: Continue HD on MWF schedule - next  tomorrow, likely at home clinic.  3. Volume: Chronically hypotensive, on midodrine. Low UF goal today - may need to [facility] EDW.  4. Anemia: postop Hgb8.7, continue Fe load. Aranesp 40 mcg IV 11/24/2020. Transfuse if needed.  5. BMM: parsabiv not on formulary, will give calcitriol with HD, continue binders. Labs at goal.  6. Nutrition:Albumin at goal. Renal/Carb mod diet, renal vit/nepro  7. Afib: Eliquis resumed.  8. Hx pericarditis: on colchicine   Veneta Penton, PA-C 11/28/2020, 10:29 AM  [facility]

## 2020-11-28 NOTE — Discharge Summary (Signed)
Patient ID: Brenda Arnold MRN: [id number] DOB/AGE: 1956/06/16 64 y.o.  Admit date: 11/23/2020 Discharge date: 11/28/2020  Admission Diagnoses:  Principal Problem:   Primary osteoarthritis of left hip Active Problems:   Status post total replacement of left hip   S/P total left hip arthroplasty   Discharge Diagnoses:  Same  Past Medical History:  Diagnosis Date  . Anemia   . Arthritis   . Asthma   . Complication of anesthesia    difficulty with getting oxygen saturation up- "patient was not aware"  Bottom of feet burning in PACU  . Constipation    because of Iron  . Diabetes mellitus    not on meds  . DVT (deep venous thrombosis) ([place]) 2019   post hip replacement - right  . Dyslipidemia   . Epistaxis 11/05/2012  . ESRD (end stage renal disease) on dialysis [facility])    "MWF; Jeneen Rinks" (09/15/2018)- started 02/13/2017  . History of blood transfusion   . HTN (hypertension)   . Hyperparathyroidism due to renal insufficiency ([place])   . Obesity    s/p panniculectomy  . Paroxysmal A-fib ([facility])    a. chronic coumadin;  b. 12/2009 Echo: EF 60-65%, Gr 1 DD.  Marland Kitchen PCOS (polycystic ovarian syndrome)   . Pericarditis 08/2019   pericarditis with pericardial effusion, s/p right VATS/pericardial window 08/31/19  . Pneumonia   . Seasonal allergies   . Sleep apnea    a. not using CPAP, last study  >8 yrs  . Vitamin D deficiency     Surgeries: Procedure(s): LEFT TOTAL HIP ARTHROPLASTY ANTERIOR APPROACH on 11/23/2020   Consultants: Treatment Team:  Madelon Lips, MD Roney Jaffe, MD  Discharged Condition: Improved  Hospital Course: Brenda Arnold is an 64 y.o. female who was admitted 11/23/2020 for operative treatment ofPrimary osteoarthritis of left hip. Patient has severe unremitting pain that affects sleep, daily activities, and work/hobbies. After pre-op clearance the patient was taken to the operating room on 11/23/2020 and underwent  Procedure(s): LEFT TOTAL HIP  ARTHROPLASTY ANTERIOR APPROACH.    Patient was given perioperative antibiotics:  Anti-infectives (From admission, onward)   Start     Dose/Rate Route Frequency Ordered Stop   11/24/20 0000  cephALEXin (KEFLEX) 250 MG capsule        250 mg Oral 2 times daily 11/24/20 1044 12/08/20 2359   11/23/20 1340  vancomycin (VANCOCIN) powder  Status:  Discontinued          As needed 11/23/20 1340 11/23/20 1553   11/23/20 1000  ceFAZolin (ANCEF) IVPB 2g/100 mL premix        2 g 200 mL/hr over 30 Minutes Intravenous On call to O.R. 11/23/20 0957 11/23/20 1338   11/23/20 0000  cephALEXin (KEFLEX) 250 MG capsule  Status:  Discontinued        250 mg Oral 2 times daily 11/23/20 1524 11/24/20        Patient was given sequential compression devices, early ambulation, and chemoprophylaxis to prevent DVT.  Patient benefited maximally from hospital stay and there were no complications.    Recent vital signs:  Patient Vitals for the past 24 hrs:  BP Temp Temp src Pulse Resp SpO2 Weight  11/27/20 1900 (!) 97/52 99.5 F (37.5 C) Oral 70 16 98 % --  11/27/20 1200 (!) 127/44 98.4 F (36.9 C) Oral 68 17 98 % 108 kg  11/27/20 1145 (!) 114/42 98.4 F (36.9 C) Oral 64 -- -- --  11/27/20 1115 (!) 91/44 -- --  67 -- -- --  11/27/20 1045 (!) 113/49 -- -- 67 -- -- --  11/27/20 1015 (!) 119/52 -- -- 66 -- -- --  11/27/20 0945 (!) 104/51 -- -- 72 -- -- --  11/27/20 0915 (!) 101/50 -- -- 67 -- -- --  11/27/20 0845 (!) 100/50 -- -- 77 -- -- --  11/27/20 0815 (!) 83/44 -- -- 75 -- -- --  11/27/20 0805 (!) 85/42 -- -- 77 -- -- --  11/27/20 0800 (!) 84/45 -- -- 80 16 -- --     Recent laboratory studies:  Recent Labs    11/25/20 0905 11/27/20 0813  WBC 16.3* 14.1*  HGB 8.7* 8.3*  HCT 28.5* 27.4*  PLT 159 244  NA 134* 137  K 3.6 3.9  CL 94* 95*  CO2 27 26  BUN 30* 75*  CREATININE 6.74* 11.54*  GLUCOSE 124* 128*  CALCIUM 8.9 9.7     Discharge Medications:   Allergies as of 11/28/2020      Reactions    Iodine Shortness Of Breath   Other Shortness Of Breath, Anaphylaxis   Shellfish Allergy Shortness Of Breath   Norvasc [amlodipine] Swelling      Medication List    STOP taking these medications   acetaminophen 650 MG CR tablet Commonly known as: TYLENOL   ondansetron 4 MG tablet Commonly known as: Zofran   oxyCODONE-acetaminophen 5-325 MG tablet Commonly known as: Percocet     TAKE these medications   bisacodyl 5 MG EC tablet Commonly known as: DULCOLAX Take 5 mg by mouth daily as needed for moderate constipation.   calcitRIOL 0.25 MCG capsule Commonly known as: ROCALTROL Take 3 capsules (0.75 mcg total) by mouth every Monday, Wednesday, and Friday with hemodialysis.   cephALEXin 250 MG capsule Commonly known as: KEFLEX Take 1 capsule (250 mg total) by mouth 2 (two) times daily for 14 days. What changed:   medication strength  how much to take  when to take this  additional instructions   colchicine 0.6 MG tablet TAKE HALF TABLET BY MOUTH EVERY MONDAY, WEDNESDAY, [place]. What changed: See the new instructions.   dextrose 50 % solution Dextrose 50%   DIALYVITE TABLET Tabs Take 1 tablet by mouth daily.   diphenhydrAMINE 25 MG tablet Commonly known as: BENADRYL Take 25 mg by mouth daily as needed for itching.   docusate sodium 100 MG capsule Commonly known as: Colace Take 1 capsule (100 mg total) by mouth daily as needed.   Eliquis 5 MG Tabs tablet Generic drug: apixaban Take 5 mg by mouth 2 (two) times daily.   ethyl chloride spray Apply 1 application topically daily as needed (port access).   gabapentin 100 MG capsule Commonly known as: NEURONTIN Take 100 mg by mouth at bedtime.   methocarbamol 500 MG tablet Commonly known as: Robaxin Take 1 tablet (500 mg total) by mouth 2 (two) times daily as needed. To be taken after surgery   midodrine 10 MG tablet Commonly known as: PROAMATINE Take 1 tablet (10 mg total) by mouth  3 (three) times daily with meals.   MIRCERA IJ Mircera   Muscle Rub 10-15 % Crea Apply 1 application topically as needed for muscle pain.   oxyCODONE 5 MG immediate release tablet Commonly known as: Roxicodone Take 1 tablet (5 mg total) by mouth every 6 (six) hours as needed for severe pain.   Velphoro 500 MG chewable tablet Generic drug: sucroferric oxyhydroxide Chew 500-1,000 mg by mouth See  admin instructions. Take 1000 mg 3 times daily with meals and 500 mg daily with a snack.            Durable Medical Equipment  (From admission, onward)         Start     Ordered   11/23/20 1639  DME Walker rolling  Once       Question:  Patient needs a walker to treat with the following condition  Answer:  History of hip replacement   11/23/20 1638   11/23/20 1639  DME 3 n 1  Once        11/23/20 1638   11/23/20 1639  DME Bedside commode  Once       Question:  Patient needs a bedside commode to treat with the following condition  Answer:  History of hip replacement   11/23/20 1638          Diagnostic Studies: DG Chest 2 View  Result Date: 11/22/2020 CLINICAL DATA:  Preop evaluation. EXAM: CHEST - 2 VIEW COMPARISON:  10/06/2019 and prior. FINDINGS: Hazy bibasilar opacities, likely atelectasis. No pneumothorax or pleural effusion. Cardiomediastinal silhouette is within normal limits. Multilevel spondylosis. IMPRESSION: No focal airspace disease.  Bibasilar atelectasis. Electronically Signed   By: Primitivo Gauze M.D.   On: 11/22/2020 10:48   DG Pelvis Portable  Result Date: 11/23/2020 CLINICAL DATA:  Follow-up total hip replacement EXAM: PORTABLE PELVIS 1-2 VIEWS COMPARISON:  10/06/2020 FINDINGS: New total hip arthroplasty of the left hip. Components appear well positioned. No radiographically detectable complication. Previous total hip arthroplasty on the right. IMPRESSION: Good appearance following total hip arthroplasty on the left. Electronically Signed   By: Nelson Chimes  M.D.   On: 11/23/2020 16:05   DG C-Arm 1-60 Min  Result Date: 11/23/2020 CLINICAL DATA:  ORIF left hip. EXAM: DG C-ARM 1-60 MIN FLUOROSCOPY TIME:  Fluoroscopy Time:  36 seconds Radiation:  4.55 mGy. COMPARISON:  October 06, 2020. FINDINGS: Four C-arm fluoroscopic images were obtained intraoperatively and submitted for post operative interpretation. These images demonstrate postsurgical changes of left total hip arthroplasty. Partially imaged prior right total hip arthroplasty. Please see the performing provider's procedural report for further detail. IMPRESSION: Intraoperative fluoroscopic images, as detailed above. Electronically Signed   By: Margaretha Sheffield MD   On: 11/23/2020 15:22   DG HIP OPERATIVE UNILAT W OR W/O PELVIS LEFT  Result Date: 11/23/2020 CLINICAL DATA:  Left hip arthroplasty EXAM: OPERATIVE LEFT HIP (WITH PELVIS IF PERFORMED) 2 VIEWS TECHNIQUE: Fluoroscopic spot image(s) were submitted for interpretation post-operatively. COMPARISON:  10/06/2020 FINDINGS: Four fluoroscopic images are obtained during the performance of the procedure and are provided for interpretation only. Initial images demonstrate severe left hip osteoarthritis. Subsequent images demonstrate placement of a left hip arthroplasty in the expected position without signs of acute complication. Stable right hip arthroplasty. FLUOROSCOPY TIME:  36 seconds IMPRESSION: 1. Left hip arthroplasty as above. Electronically Signed   By: Randa Ngo M.D.   On: 11/23/2020 15:22    Disposition: Discharge disposition: 03-Skilled Nursing Facility       Discharge Instructions    ceFAZolin (ANCEF) per pharmacy consult   Complete by: As directed    Surgical prophylaxis.  ESRD patient.       Follow-up Information    Leandrew Koyanagi, MD In 2 weeks.   Specialty: Orthopedic Surgery Why: For suture removal, For wound re-check Contact information: [place] [address] [phone]  Signed: Aundra Dubin 11/28/2020, 7:55 AM

## 2020-11-28 NOTE — Plan of Care (Signed)
  Problem: Activity: Goal: Ability to avoid complications of mobility impairment will improve Outcome: Progressing Goal: Ability to tolerate increased activity will improve Outcome: Progressing   Problem: Education: Goal: Verbalization of understanding the information provided will improve Outcome: Progressing   Problem: Coping: Goal: Level of anxiety will decrease Outcome: Progressing   Problem: Physical Regulation: Goal: Postoperative complications will be avoided or minimized Outcome: Progressing   Problem: Respiratory: Goal: Ability to maintain a clear airway will improve Outcome: Progressing   Problem: Pain Management: Goal: Pain level will decrease Outcome: Progressing   Problem: Skin Integrity: Goal: Signs of wound healing will improve Outcome: Progressing   Problem: Tissue Perfusion: Goal: Ability to maintain adequate tissue perfusion will improve Outcome: Progressing   Problem: Education: Goal: Knowledge of General Education information will improve Description: Including pain rating scale, medication(s)/side effects and non-pharmacologic comfort measures Outcome: Progressing   Problem: Health Behavior/Discharge Planning: Goal: Ability to manage health-related needs will improve Outcome: Progressing   Problem: Clinical Measurements: Goal: Ability to maintain clinical measurements within normal limits will improve Outcome: Progressing Goal: Will remain free from infection Outcome: Progressing Goal: Diagnostic test results will improve Outcome: Progressing Goal: Respiratory complications will improve Outcome: Progressing Goal: Cardiovascular complication will be avoided Outcome: Progressing   Problem: Activity: Goal: Risk for activity intolerance will decrease Outcome: Progressing   Problem: Nutrition: Goal: Adequate nutrition will be maintained Outcome: Progressing   Problem: Coping: Goal: Level of anxiety will decrease Outcome: Progressing    Problem: Elimination: Goal: Will not experience complications related to bowel motility Outcome: Progressing Goal: Will not experience complications related to urinary retention Outcome: Progressing   Problem: Pain Managment: Goal: General experience of comfort will improve Outcome: Progressing   Problem: Safety: Goal: Ability to remain free from injury will improve Outcome: Progressing   Problem: Skin Integrity: Goal: Risk for impaired skin integrity will decrease Outcome: Progressing   

## 2020-11-28 NOTE — Progress Notes (Signed)
Subjective: 5 Days Post-Op Procedure(s) (LRB): LEFT TOTAL HIP ARTHROPLASTY ANTERIOR APPROACH (Left) Patient reports pain as mild.    Objective: Vital signs in last 24 hours: Temp:  [98.2 F (36.8 C)-99.5 F (37.5 C)] 99.5 F (37.5 C) (12/27 1900) Pulse Rate:  [64-81] 70 (12/27 1900) Resp:  [16-17] 16 (12/27 1900) BP: (83-127)/(42-55) 97/52 (12/27 1900) SpO2:  [98 %-100 %] 98 % (12/27 1900) Weight:  [108 kg-110.9 kg] 108 kg (12/27 1200)  Intake/Output from previous day: 12/27 0701 - 12/28 0700 In: 720 [P.O.:720] Out: 2000  Intake/Output this shift: No intake/output data recorded.  Recent Labs    11/25/20 0905 11/27/20 0813  HGB 8.7* 8.3*   Recent Labs    11/25/20 0905 11/27/20 0813  WBC 16.3* 14.1*  RBC 2.85* 2.75*  HCT 28.5* 27.4*  PLT 159 244   Recent Labs    11/25/20 0905 11/27/20 0813  NA 134* 137  K 3.6 3.9  CL 94* 95*  CO2 27 26  BUN 30* 75*  CREATININE 6.74* 11.54*  GLUCOSE 124* 128*  CALCIUM 8.9 9.7   No results for input(s): LABPT, INR in the last 72 hours.  Neurologically intact Neurovascular intact Sensation intact distally Intact pulses distally Dorsiflexion/Plantar flexion intact Incision: dressing C/D/I No cellulitis present Compartment soft   Assessment/Plan: 5 Days Post-Op Procedure(s) (LRB): LEFT TOTAL HIP ARTHROPLASTY ANTERIOR APPROACH (Left) Advance diet Up with therapy D/C IV fluids Discharge to SNF once bed available (hopeful for today). Will need to coordinate with hemodialysis as she has this every m/w/f.   D/c to snf covid test negative yesterday am.  Placed new order but nurse states will not utilize unless time limit from previous test expires prior to d/c WBAT LLE ABLA- no new symptoms F/u with Dr. Erlinda Hong 2 weeks po    Brenda Arnold 11/28/2020, 7:53 AM

## 2020-11-28 NOTE — Progress Notes (Signed)
Physical Therapy Treatment Patient Details Name: JACQUES WILLINGHAM MRN: [id number] DOB: 1956/08/07 Today's Date: 11/28/2020    History of Present Illness Patient is a 64 y/o female with PMH includes anemia, DM, ESRD on HD, HTN, hyperparathyroidism, a-fib, PCOS, sleep apnea. Patient s/p L THA on 12/23.    PT Comments    Patient with increased fatigue this session due to entering room while patient is bathing seated at sink. Patient performed sit to stand x 3 with min guard for safety. Patient ambulated 100' with RW and min guard for safety due to fatigue. Patient continues to be limited by decreased activity tolerance, impaired balance, generalized weakness. Continue to recommend SNF for ongoing Physical Therapy.       Follow Up Recommendations  SNF     Equipment Recommendations  None recommended by PT    Recommendations for Other Services       Precautions / Restrictions Precautions Precautions: Fall Restrictions Weight Bearing Restrictions: Yes LLE Weight Bearing: Weight bearing as tolerated    Mobility  Bed Mobility               General bed mobility comments: taking sink side bath in bathroom, seated  Transfers Overall transfer level: Needs assistance Equipment used: Rolling Farhiya Rosten (2 wheeled) Transfers: Sit to/from Stand Sit to Stand: Min guard         General transfer comment: sit to stand x 3 during bathing tasks  Ambulation/Gait Ambulation/Gait assistance: Min guard Gait Distance (Feet): 100 Feet Assistive device: Rolling Adem Costlow (2 wheeled) Gait Pattern/deviations: Step-to pattern;Decreased stride length Gait velocity: decreased   General Gait Details: patient with increased fatigue in last 25' due to exertion during bathing   Stairs             Wheelchair Mobility    Modified Rankin (Stroke Patients Only)       Balance Overall balance assessment: Needs assistance Sitting-balance support: No upper extremity supported;Feet  supported Sitting balance-Leahy Scale: Good     Standing balance support: Bilateral upper extremity supported Standing balance-Leahy Scale: Poor Standing balance comment: reliant on UE support                            Cognition Arousal/Alertness: Awake/alert Behavior During Therapy: WFL for tasks assessed/performed Overall Cognitive Status: Within Functional Limits for tasks assessed                                        Exercises      General Comments        Pertinent Vitals/Pain Pain Assessment: Faces Faces Pain Scale: Hurts little more Pain Location: L hip Pain Descriptors / Indicators: Sore Pain Intervention(s): Monitored during session    Home Living                      Prior Function            PT Goals (current goals can now be found in the care plan section) Acute Rehab PT Goals Patient Stated Goal: to be independent PT Goal Formulation: With patient Time For Goal Achievement: 12/08/20 Potential to Achieve Goals: Good Progress towards PT goals: Progressing toward goals    Frequency    7X/week      PT Plan Current plan remains appropriate    Co-evaluation  AM-PAC PT "6 Clicks" Mobility   Outcome Measure  Help needed turning from your back to your side while in a flat bed without using bedrails?: A Little Help needed moving from lying on your back to sitting on the side of a flat bed without using bedrails?: A Little Help needed moving to and from a bed to a chair (including a wheelchair)?: A Little Help needed standing up from a chair using your arms (e.g., wheelchair or bedside chair)?: A Little Help needed to walk in hospital room?: A Little Help needed climbing 3-5 steps with a railing? : A Lot 6 Click Score: 17    End of Session Equipment Utilized During Treatment: Gait belt Activity Tolerance: Patient limited by fatigue Patient left: in chair;with call bell/phone within  reach Nurse Communication: Mobility status PT Visit Diagnosis: Unsteadiness on feet (R26.81);Other abnormalities of gait and mobility (R26.89);Muscle weakness (generalized) (M62.81)     Time: 1638-4536 PT Time Calculation (min) (ACUTE ONLY): 17 min  Charges:  $Therapeutic Activity: 8-22 mins                     Perrin Maltese, PT, DPT Acute Rehabilitation Services Pager [phone] Office [phone]    Chesley Noon K Allred 11/28/2020, 11:08 AM

## 2020-11-28 NOTE — TOC Initial Note (Signed)
Transition of Care [facility]) - Initial/Assessment Note    Patient Details  Name: Brenda Arnold MRN: [id number] Date of Birth: 29-Oct-1956  Transition of Care [facility]) CM/SW Contact:    Curlene Labrum, RN Phone Number: 11/28/2020, 4:45 PM  Clinical Narrative:                 Case management met with the patient at the bedside to give choice regarding SNF facility.  The patient chose [facility] SN[facility] facility.  I called and spoke with admissions and they will call me back to confirm that they are able to provide 530 am transportation to the [facility] facility.  The patient is aware.  I also notified Dr. Erlinda Hong that patient will most likely discharge to a SNF tomorrow once HD transportation and bed availability is confirmed and the SNF facility.  The patient is fully vaccinated for COVID including a vaccine booster.  Will continue to follow for discharge by PTAR to the SNF facility tomorrow, 11/29/2020.  Expected Discharge Plan: Skilled Nursing Facility Barriers to Discharge: Continued Medical Work up [name] to call back about HD transport time for pt acceptance to facility -)   Patient Goals and CMS Choice Patient states their goals for this hospitalization and ongoing recovery are:: Patient plans to discharge to SNF facility. CMS Medicare.gov Compare Post Acute Care list provided to:: Patient Choice offered to / list presented to : Patient  Expected Discharge Plan and Services Expected Discharge Plan: [place] In-house Referral: Clinical Social Work Discharge Planning Services: CM Consult Post Acute Care Choice: [place] arrangements for the past 2 months: [place] Expected Discharge Date: 11/28/20                                    Prior Living Arrangements/Services Living arrangements for the past 2 months: Single Family Home Lives with:: Self Patient language and need for interpreter reviewed:: Yes Do you feel safe  going back to the place where you live?: Yes      Need for Family Participation in Patient Care: Yes (Comment) Care giver support system in place?: Yes (comment) Current home services: DME Criminal Activity/Legal Involvement Pertinent to Current Situation/Hospitalization: No - Comment as needed  Activities of Daily Living Home Assistive Devices/Equipment: Walker (specify type),Eyeglasses ADL Screening (condition at time of admission) Patient's cognitive ability adequate to safely complete daily activities?: Yes Is the patient deaf or have difficulty hearing?: No Does the patient have difficulty seeing, even when wearing glasses/contacts?: No Does the patient have difficulty concentrating, remembering, or making decisions?: No Patient able to express need for assistance with ADLs?: Yes Does the patient have difficulty dressing or bathing?: No Independently performs ADLs?: Yes (appropriate for developmental age) Does the patient have difficulty walking or climbing stairs?: Yes Weakness of Legs: Left Weakness of Arms/Hands: None  Permission Sought/Granted Permission sought to share information with : Case Manager       Permission granted to share info w AGENCY: SNF facility  Permission granted to share info w Relationship: family contacts     Emotional Assessment Appearance:: Appears stated age Attitude/Demeanor/Rapport: Self-Confident Affect (typically observed): Accepting Orientation: : Oriented to Self,Oriented to Place,Oriented to  Time,Oriented to Situation Alcohol / Substance Use: Not Applicable Psych Involvement: No (comment)  Admission diagnosis:  Status post total replacement of left hip [Z96.642] S/P total left hip arthroplasty [A35.573] Patient Active Problem List  Diagnosis Date Noted  . S/P total left hip arthroplasty 11/25/2020  . Primary osteoarthritis of left hip 11/23/2020  . Status post total replacement of left hip 11/23/2020  . Diarrhea, unspecified  09/06/2020  . Headache, unspecified 07/11/2020  . Subacute osteomyelitis of right hand ([place]) 01/27/2020  . Finger infection 01/26/2020  . ESRD (end stage renal disease) ([place]) 01/13/2020  . ESRD on dialysis ([place])   . Physical debility 09/09/2019  . Pericardial effusion   . SOB (shortness of breath)   . Leukocytosis   . Anemia of chronic disease   . PAF (paroxysmal atrial fibrillation) ([place])   . Postoperative pain   . Cardiac tamponade   . Chest pain at rest 08/26/2019  . Tachycardia 08/26/2019  . Pericarditis 08/26/2019  . Angina pectoris, unspecified ([place]) 08/25/2019  . CAD (coronary artery disease) 11/05/2018  . A-fib ([place]) 11/05/2018  . Atrial fibrillation with RVR ([place])   . Hypotension 11/04/2018  . ESRD on hemodialysis ([place]) 11/04/2018  . DVT (deep venous thrombosis) ([place]) 11/03/2018  . Hyperkalemia 10/02/2018  . Status post total replacement of right hip 09/15/2018  . Varicose veins of bilateral lower extremities with other complications 16/12/930  . Hypercalcemia 05/17/2017  . Secondary hyperparathyroidism of renal origin ([place]) 02/21/2017  . Pruritus, unspecified 02/13/2017  . Pain, unspecified 02/13/2017  . Other specified coagulation defects ([place]) 02/13/2017  . Iron deficiency anemia, unspecified 02/13/2017  . Anticoagulation goal of INR 2 to 3 09/30/2014  . Pain in lower limb 05/02/2014  . Onychomycosis due to dermatophyte 04/06/2013  . Pain in joint, ankle and foot 04/06/2013  . Bradycardia 11/06/2012  . Left-sided epistaxis 11/05/2012  . DM (diabetes mellitus) ([place]) 11/05/2012  . OSA (obstructive sleep apnea) 11/05/2012  . Acute posterior epistaxis 11/05/2012  . CKD (chronic kidney disease) 11/05/2012  . End stage renal disease ([place]) 06/25/2012  . Type 2 diabetes mellitus with complication, without long-term current use of insulin ([place]) 12/20/2009  . OBESITY 12/20/2009  . OBESITY-MORBID (>100') 12/20/2009  . Essential hypertension 12/20/2009  . Paroxysmal  atrial fibrillation ([place]) 12/20/2009  . Chest pain 12/20/2009  . ABNORMAL CV (STRESS) TEST 12/20/2009   PCP:  Kelton Pillar, MD Pharmacy:   [id number] [place] [id number]: [phone]: [phone]    Social Determinants of Health (SDOH) Interventions    Readmission Risk Interventions Readmission Risk Prevention Plan 11/28/2020 08/30/2019  Transportation Screening Complete Complete  PCP or Specialist Appt within 5-7 Days Complete -  PCP or Specialist Appt within 3-5 Days - Complete  Home Care Screening Complete -  Medication Review (RN CM) Complete -  HRI or [place]- Complete  Social Work Consult for [place]/Counseling - Complete  Palliative Care Screening - Not Applicable  Medication Review (Press photographer - Complete  Some recent data might be hidden

## 2020-11-28 NOTE — Care Management Important Message (Signed)
Important Message  Patient Details  Name: Brenda Arnold MRN: [id number] Date of Birth: 04-22-1956   Medicare Important Message Given:  Yes     Orbie Pyo 11/28/2020, 4:02 PM

## 2020-11-29 LAB — GLUCOSE, CAPILLARY: Glucose-Capillary: 93 mg/dL (ref 70–99)

## 2020-11-29 MED ORDER — CALCITRIOL 0.5 MCG PO CAPS
ORAL_CAPSULE | ORAL | Status: AC
  Filled 2020-11-29: qty 2

## 2020-11-29 NOTE — Progress Notes (Signed)
  [facility] Progress Note   Subjective:  Seen on HD - 2L UF goal and BP holding for now. No dyspnea or CP. Discharge delayed d/t SNF coordination - should leave this afternoon.  Objective Vitals:   11/28/20 0511 11/28/20 1323 11/28/20 1956 11/29/20 0442  BP: (!) 90/49 (!) 94/58 (!) 116/52 (!) 81/39  Pulse: 67 81 82 68  Resp: 17 18 15    Temp: 99.1 F (37.3 C) 98.1 F (36.7 C) 98.6 F (37 C)   TempSrc: Oral Oral Oral   SpO2: 100% 99% 100% 99%  Weight:      Height:       Physical Exam General:Well appearing, NAD Heart:RRR; no murmur Lungs:CTA anteriorly Abdomen:soft Extremities:No LE edema Dialysis Access:L AVF + thrill  Additional Objective Labs: Basic Metabolic Panel: Recent Labs  Lab 11/23/20 2029 11/24/20 0322 11/25/20 0905 11/27/20 0813  NA 139 137 134* 137  K 3.9 5.2* 3.6 3.9  CL 100 98 94* 95*  CO2 25 25 27 26   GLUCOSE 134* 145* 124* 128*  BUN 36* 44* 30* 75*  CREATININE 9.12* 10.16* 6.74* 11.54*  CALCIUM 9.1 9.1 8.9 9.7  PHOS 5.4*  --   --  4.3   Liver Function Tests: Recent Labs  Lab 11/23/20 2029 11/27/20 0813  ALBUMIN 4.2 2.7*   CBC: Recent Labs  Lab 11/23/20 2029 11/24/20 0322 11/25/20 0905 11/27/20 0813  WBC 13.1* 9.0 16.3* 14.1*  HGB 10.5* 9.3* 8.7* 8.3*  HCT 32.5* 29.3* 28.5* 27.4*  MCV 97.6 97.7 100.0 99.6  PLT 124* 134* 159 244   CBG: Recent Labs  Lab 11/23/20 1035 11/23/20 1151 11/23/20 1230 11/23/20 1543  GLUCAP 73 60* 102* 117*   Medications: . sodium chloride    . sodium chloride    . iron sucrose Stopped (11/27/20 1201)  . methocarbamol (ROBAXIN) IV     . apixaban  5 mg Oral BID  . aspirin  81 mg Oral BID  . calcitRIOL  2 mcg Oral Q M,W,F-HD  . Chlorhexidine Gluconate Cloth  6 each Topical Q0600  . colchicine  0.3 mg Oral Q M,W,F  . darbepoetin (ARANESP) injection - DIALYSIS  40 mcg Intravenous Q Fri-HD  . docusate sodium  100 mg Oral BID  . gabapentin  100 mg Oral QHS  . midodrine  10  mg Oral TID WC  . polyethylene glycol  17 g Oral Daily  . sucroferric oxyhydroxide  1,000 mg Oral TID with meals  . sucroferric oxyhydroxide  500 mg Oral With snacks    Dialysis Orders: [facility] MWF 4 hrs EDW 105 kg F200 dialyzer 2K/ 2.25 Ca BFR 350/ DFR 800No heparin -Mircera 60 mcg q 2 weeks -Venofer 100 mg q 8 times (ending 12/04/2020) -Calcitriol 2.0 mcg q rx -Parsabiv 12.5 mcg q rx(not available in hospital)  Assessment/Plan  1. S/p L hip THA12/23/2021:Pain improving - for OP/SNF rehab soon.Per primary/Ortho.  2. ESRD: Continue HD on MWF schedule - HD today, 2L UF goal.  3. Volume:Chronically hypotensive, on midodrine.   4.Anemia: postop Hgb8.3, continue Fe load. Aranesp 40 mcg IV 11/24/2020. Transfuse if needed.  5. BMM: parsabiv not on formulary, will give calcitriol with HD, continue binders. Labs at goal.  6.Nutrition:Albumin low - needs ^ protein diet.  7.Afib: Eliquisresumed.  8. Hxpericarditis: on colchicine    Veneta Penton, PA-C 11/29/2020, 8:40 AM  [facility]

## 2020-11-29 NOTE — Plan of Care (Signed)
  Problem: Activity: Goal: Ability to avoid complications of mobility impairment will improve Outcome: Progressing Goal: Ability to tolerate increased activity will improve Outcome: Progressing   Problem: Education: Goal: Verbalization of understanding the information provided will improve Outcome: Progressing   Problem: Coping: Goal: Level of anxiety will decrease Outcome: Progressing   Problem: Physical Regulation: Goal: Postoperative complications will be avoided or minimized Outcome: Progressing   Problem: Respiratory: Goal: Ability to maintain a clear airway will improve Outcome: Progressing   Problem: Pain Management: Goal: Pain level will decrease Outcome: Progressing   Problem: Skin Integrity: Goal: Signs of wound healing will improve Outcome: Progressing   Problem: Tissue Perfusion: Goal: Ability to maintain adequate tissue perfusion will improve Outcome: Progressing   Problem: Education: Goal: Knowledge of General Education information will improve Description: Including pain rating scale, medication(s)/side effects and non-pharmacologic comfort measures Outcome: Progressing   Problem: Health Behavior/Discharge Planning: Goal: Ability to manage health-related needs will improve Outcome: Progressing   Problem: Clinical Measurements: Goal: Ability to maintain clinical measurements within normal limits will improve Outcome: Progressing Goal: Will remain free from infection Outcome: Progressing Goal: Diagnostic test results will improve Outcome: Progressing Goal: Respiratory complications will improve Outcome: Progressing Goal: Cardiovascular complication will be avoided Outcome: Progressing   Problem: Activity: Goal: Risk for activity intolerance will decrease Outcome: Progressing   Problem: Nutrition: Goal: Adequate nutrition will be maintained Outcome: Progressing   Problem: Coping: Goal: Level of anxiety will decrease Outcome: Progressing    Problem: Elimination: Goal: Will not experience complications related to bowel motility Outcome: Progressing Goal: Will not experience complications related to urinary retention Outcome: Progressing   Problem: Pain Managment: Goal: General experience of comfort will improve Outcome: Progressing   Problem: Safety: Goal: Ability to remain free from injury will improve Outcome: Progressing   Problem: Skin Integrity: Goal: Risk for impaired skin integrity will decrease Outcome: Progressing   

## 2020-11-29 NOTE — Progress Notes (Signed)
PT Cancellation Note  Patient Details Name: Brenda Arnold MRN: [id number] DOB: [birth year]   Cancelled Treatment:    Reason Eval/Treat Not Completed: Other (comment) (Patient to d/c) Attempted session in AM, patient off unit in HD. Patient plan to d/c at 2:30 to SNF. Will re-attempt if patient remains inpatient.   Perrin Maltese, PT, DPT Acute Rehabilitation Services Pager [phone] Office [phone]   Alda Lea 11/29/2020, 2:06 PM

## 2020-11-29 NOTE — TOC Transition Note (Signed)
Transition of Care [facility]) - CM/SW Discharge Note   Patient Details  Name: Brenda Arnold MRN: [id number] Date of Birth: [birth year]  Transition of Care [facility]) CM/SW Contact:  Curlene Labrum, RN Phone Number: 11/29/2020, 1:31 PM   Clinical Narrative:    Case management spoke with Brenda Arnold, CM at [place] and they have received insurance authorization for the patient and have an available bed open for the patient.  The discharge summary was placed in the hub for the facility.    The primary RN, Amy, will call report to [facility] SNF at [phone].  The patient is aware of her transfer to the facility and PTAR was arranged for 2:30 pm today for transfer to the facility.   Final next level of care: [place] Barriers to Discharge: Continued Medical Work up [name] to call back about HD transport time for pt acceptance to facility -)   Patient Goals and CMS Choice Patient states their goals for this hospitalization and ongoing recovery are:: Patient plans to discharge to SNF facility. CMS Medicare.gov Compare Post Acute Care list provided to:: Patient Choice offered to / list presented to : Patient  Discharge Placement                       Discharge Plan and Services In-house Referral: Clinical Social Work Discharge Planning Services: CM Consult Post Acute Care Choice: Skilled Nursing Facility                               Social Determinants of Health (SDOH) Interventions     Readmission Risk Interventions Readmission Risk Prevention Plan 11/28/2020 08/30/2019  Transportation Screening Complete Complete  PCP or Specialist Appt within 5-7 Days Complete -  PCP or Specialist Appt within 3-5 Days - Complete  Home Care Screening Complete -  Medication Review (RN CM) Complete -  HRI or [place] - Complete  Social Work Consult for [place] Planning/Counseling - Complete  Palliative Care Screening - Not  Applicable  Medication Review Press photographer) - Complete  Some recent data might be hidden

## 2020-11-29 NOTE — Discharge Summary (Signed)
Patient ID: Brenda Arnold MRN: [id number] DOB/AGE: [birth year] 64 y.o.  Admit date: 11/23/2020 Discharge date: 11/29/2020  Admission Diagnoses:  Principal Problem:   Primary osteoarthritis of left hip Active Problems:   Status post total replacement of left hip   S/P total left hip arthroplasty   Discharge Diagnoses:  Same  Past Medical History:  Diagnosis Date   Anemia    Arthritis    Asthma    Complication of anesthesia    difficulty with getting oxygen saturation up- "patient was not aware"  Bottom of feet burning in PACU   Constipation    because of Iron   Diabetes mellitus    not on meds   DVT (deep venous thrombosis) ([place]) 2019   post hip replacement - right   Dyslipidemia    Epistaxis 11/05/2012   ESRD (end stage renal disease) on dialysis ([place])    "MWF; [facility]" (09/15/2018)- started 02/13/2017   History of blood transfusion    HTN (hypertension)    Hyperparathyroidism due to renal insufficiency (HCC)    Obesity    s/p panniculectomy   Paroxysmal A-fib ([place])    a. chronic coumadin;  b. 12/2009 Echo: EF 60-65%, Gr 1 DD.   PCOS (polycystic ovarian syndrome)    Pericarditis 08/2019   pericarditis with pericardial effusion, s/p right VATS/pericardial window 08/31/19   Pneumonia    Seasonal allergies    Sleep apnea    a. not using CPAP, last study  >8 yrs   Vitamin D deficiency     Surgeries: Procedure(s): LEFT TOTAL HIP ARTHROPLASTY ANTERIOR APPROACH on 11/23/2020   Consultants: Treatment Team:  Madelon Lips, MD Roney Jaffe, MD  Discharged Condition: Improved  Hospital Course: Brenda Arnold is an 64 y.o. female who was admitted 11/23/2020 for operative treatment ofPrimary osteoarthritis of left hip. Patient has severe unremitting pain that affects sleep, daily activities, and work/hobbies. After pre-op clearance the patient was taken to the operating room on 11/23/2020 and underwent  Procedure(s): LEFT TOTAL HIP  ARTHROPLASTY ANTERIOR APPROACH.    Patient was given perioperative antibiotics:  Anti-infectives (From admission, onward)   Start     Dose/Rate Route Frequency Ordered Stop   11/24/20 0000  cephALEXin (KEFLEX) 250 MG capsule        250 mg Oral 2 times daily 11/24/20 1044 12/08/20 2359   11/23/20 1340  vancomycin (VANCOCIN) powder  Status:  Discontinued          As needed 11/23/20 1340 11/23/20 1553   11/23/20 1000  ceFAZolin (ANCEF) IVPB 2g/100 mL premix        2 g 200 mL/hr over 30 Minutes Intravenous On call to O.R. 11/23/20 0957 11/23/20 1338   11/23/20 0000  cephALEXin (KEFLEX) 250 MG capsule  Status:  Discontinued        250 mg Oral 2 times daily 11/23/20 1524 11/24/20        Patient was given sequential compression devices, early ambulation, and chemoprophylaxis to prevent DVT.  Patient benefited maximally from hospital stay and there were no complications.    Recent vital signs:  Patient Vitals for the past 24 hrs:  BP Temp Temp src Pulse Resp SpO2  11/29/20 0442 (!) 81/39 -- -- 68 -- 99 %  11/28/20 1956 (!) 116/52 98.6 F (37 C) Oral 82 15 100 %  11/28/20 1323 (!) 94/58 98.1 F (36.7 C) Oral 81 18 99 %     Recent laboratory studies:  Recent Labs    11/27/20  0813  WBC 14.1*  HGB 8.3*  HCT 27.4*  PLT 244  NA 137  K 3.9  CL 95*  CO2 26  BUN 75*  CREATININE 11.54*  GLUCOSE 128*  CALCIUM 9.7     Discharge Medications:   Allergies as of 11/29/2020      Reactions   Iodine Shortness Of Breath   Other Shortness Of Breath, Anaphylaxis   Shellfish Allergy Shortness Of Breath   Norvasc [amlodipine] Swelling      Medication List    STOP taking these medications   acetaminophen 650 MG CR tablet Commonly known as: TYLENOL   ondansetron 4 MG tablet Commonly known as: Zofran   oxyCODONE-acetaminophen 5-325 MG tablet Commonly known as: Percocet     TAKE these medications   bisacodyl 5 MG EC tablet Commonly known as: DULCOLAX Take 5 mg by mouth daily as  needed for moderate constipation.   calcitRIOL 0.25 MCG capsule Commonly known as: ROCALTROL Take 3 capsules (0.75 mcg total) by mouth every Monday, Wednesday, and Friday with hemodialysis.   cephALEXin 250 MG capsule Commonly known as: KEFLEX Take 1 capsule (250 mg total) by mouth 2 (two) times daily for 14 days. What changed:   medication strength  how much to take  when to take this  additional instructions   colchicine 0.6 MG tablet TAKE HALF TABLET BY MOUTH EVERY MONDAY, WEDNESDAY, [place]. What changed: See the new instructions.   dextrose 50 % solution Dextrose 50%   DIALYVITE TABLET Tabs Take 1 tablet by mouth daily.   diphenhydrAMINE 25 MG tablet Commonly known as: BENADRYL Take 25 mg by mouth daily as needed for itching.   docusate sodium 100 MG capsule Commonly known as: Colace Take 1 capsule (100 mg total) by mouth daily as needed.   Eliquis 5 MG Tabs tablet Generic drug: apixaban Take 5 mg by mouth 2 (two) times daily.   ethyl chloride spray Apply 1 application topically daily as needed (port access).   gabapentin 100 MG capsule Commonly known as: NEURONTIN Take 100 mg by mouth at bedtime.   methocarbamol 500 MG tablet Commonly known as: Robaxin Take 1 tablet (500 mg total) by mouth 2 (two) times daily as needed. To be taken after surgery   midodrine 10 MG tablet Commonly known as: PROAMATINE Take 1 tablet (10 mg total) by mouth 3 (three) times daily with meals.   MIRCERA IJ Mircera   Muscle Rub 10-15 % Crea Apply 1 application topically as needed for muscle pain.   oxyCODONE 5 MG immediate release tablet Commonly known as: Roxicodone Take 1 tablet (5 mg total) by mouth every 6 (six) hours as needed for severe pain.   Velphoro 500 MG chewable tablet Generic drug: sucroferric oxyhydroxide Chew 500-1,000 mg by mouth See admin instructions. Take 1000 mg 3 times daily with meals and 500 mg daily with a snack.             Durable Medical Equipment  (From admission, onward)         Start     Ordered   11/23/20 1639  DME Walker rolling  Once       Question:  Patient needs a walker to treat with the following condition  Answer:  History of hip replacement   11/23/20 1638   11/23/20 1639  DME 3 n 1  Once        11/23/20 1638   11/23/20 1639  DME Bedside commode  Once  Question:  Patient needs a bedside commode to treat with the following condition  Answer:  History of hip replacement   11/23/20 1638          Diagnostic Studies: DG Chest 2 View  Result Date: 11/22/2020 CLINICAL DATA:  Preop evaluation. EXAM: CHEST - 2 VIEW COMPARISON:  10/06/2019 and prior. FINDINGS: Hazy bibasilar opacities, likely atelectasis. No pneumothorax or pleural effusion. Cardiomediastinal silhouette is within normal limits. Multilevel spondylosis. IMPRESSION: No focal airspace disease.  Bibasilar atelectasis. Electronically Signed   By: Primitivo Gauze M.D.   On: 11/22/2020 10:48   DG Pelvis Portable  Result Date: 11/23/2020 CLINICAL DATA:  Follow-up total hip replacement EXAM: PORTABLE PELVIS 1-2 VIEWS COMPARISON:  10/06/2020 FINDINGS: New total hip arthroplasty of the left hip. Components appear well positioned. No radiographically detectable complication. Previous total hip arthroplasty on the right. IMPRESSION: Good appearance following total hip arthroplasty on the left. Electronically Signed   By: Nelson Chimes M.D.   On: 11/23/2020 16:05   DG C-Arm 1-60 Min  Result Date: 11/23/2020 CLINICAL DATA:  ORIF left hip. EXAM: DG C-ARM 1-60 MIN FLUOROSCOPY TIME:  Fluoroscopy Time:  36 seconds Radiation:  4.55 mGy. COMPARISON:  October 06, 2020. FINDINGS: Four C-arm fluoroscopic images were obtained intraoperatively and submitted for post operative interpretation. These images demonstrate postsurgical changes of left total hip arthroplasty. Partially imaged prior right total hip arthroplasty. Please see the performing  provider's procedural report for further detail. IMPRESSION: Intraoperative fluoroscopic images, as detailed above. Electronically Signed   By: Margaretha Sheffield MD   On: 11/23/2020 15:22   DG HIP OPERATIVE UNILAT W OR W/O PELVIS LEFT  Result Date: 11/23/2020 CLINICAL DATA:  Left hip arthroplasty EXAM: OPERATIVE LEFT HIP (WITH PELVIS IF PERFORMED) 2 VIEWS TECHNIQUE: Fluoroscopic spot image(s) were submitted for interpretation post-operatively. COMPARISON:  10/06/2020 FINDINGS: Four fluoroscopic images are obtained during the performance of the procedure and are provided for interpretation only. Initial images demonstrate severe left hip osteoarthritis. Subsequent images demonstrate placement of a left hip arthroplasty in the expected position without signs of acute complication. Stable right hip arthroplasty. FLUOROSCOPY TIME:  36 seconds IMPRESSION: 1. Left hip arthroplasty as above. Electronically Signed   By: Randa Ngo M.D.   On: 11/23/2020 15:22    Disposition: Discharge disposition: 03-Skilled Nursing Facility       Discharge Instructions    ceFAZolin (ANCEF) per pharmacy consult   Complete by: As directed    Surgical prophylaxis.  ESRD patient.       Follow-up Information    Leandrew Koyanagi, MD In 2 weeks.   Specialty: Orthopedic Surgery Why: For suture removal, For wound re-check Contact information: [place] [address] [phone]                Signed: Aundra Dubin 11/29/2020, 7:58 AM

## 2020-11-29 NOTE — Progress Notes (Signed)
Patient ID: Brenda Arnold, female   DOB: Feb 22, 1956, 64 y.o.   MRN: [id number]  Patient has already left for hemodialysis this am.  It appears she will likely be d/c to snf at some point today.  I will place new order for d/c to snf covid test if needed.  She will follow up with Dr. Erlinda Hong 2 weeks post-op

## 2020-11-29 NOTE — TOC Progression Note (Signed)
Transition of Care [facility]) - Progression Note    Patient Details  Name: [name] MRN: [id number] Date of Birth: 1956-09-27  [facility] of [facility]) CM/SW [place], RN Phone Number: 11/29/2020, 10:14 AM  Clinical Narrative:    Case management spoke with Annamaria Helling, CM at [place] and she is starting insurance authorization with [employer] this morning along with coverage through her traditional Medicare.  The patient should be able to discharge to the facility this afternoon after insurance authorization is confirmed.   Expected Discharge Plan: [place] Barriers to Discharge: Continued Medical Work up [name] to call back about HD transport time for pt acceptance to facility -)  Expected Discharge Plan and Services Expected Discharge Plan: [place] In-house Referral: Clinical Social Work Discharge Planning Services: CM Consult Post Acute Care Choice: [place] arrangements for the past 2 months: Single Family Home Expected Discharge Date: 11/29/20                                     Social Determinants of Health (SDOH) Interventions    Readmission Risk Interventions Readmission Risk Prevention Plan 11/28/2020 08/30/2019  Transportation Screening Complete Complete  PCP or Specialist Appt within 5-7 Days Complete -  PCP or Specialist Appt within 3-5 Days - Complete  Home Care Screening Complete -  Medication Review (RN CM) Complete -  HRI or [place] - Complete  Social Work Consult for [place] Planning/Counseling - Complete  Palliative Care Screening - Not Applicable  Medication Review Press photographer) - Complete  Some recent data might be hidden

## 2020-11-29 NOTE — Progress Notes (Signed)
Nsg Discharge Note  Admit Date:  11/23/2020 Discharge date: 11/29/2020   Brenda Arnold to be D/C'd to Brenda Arnold per MD order.  AVS completed.  Copy for chart, and copy for patient signed, and dated. Patient/caregiver able to verbalize understanding.  Discharge Medication: Allergies as of 11/29/2020      Reactions   Iodine Shortness Of Breath   Other Shortness Of Breath, Anaphylaxis   Shellfish Allergy Shortness Of Breath   Norvasc [amlodipine] Swelling      Medication List    STOP taking these medications   acetaminophen 650 MG CR tablet Commonly known as: TYLENOL   ondansetron 4 MG tablet Commonly known as: Zofran   oxyCODONE-acetaminophen 5-325 MG tablet Commonly known as: Percocet     TAKE these medications   bisacodyl 5 MG EC tablet Commonly known as: DULCOLAX Take 5 mg by mouth daily as needed for moderate constipation.   calcitRIOL 0.25 MCG capsule Commonly known as: ROCALTROL Take 3 capsules (0.75 mcg total) by mouth every Monday, Wednesday, and Friday with hemodialysis.   cephALEXin 250 MG capsule Commonly known as: KEFLEX Take 1 capsule (250 mg total) by mouth 2 (two) times daily for 14 days. What changed:   medication strength  how much to take  when to take this  additional instructions   colchicine 0.6 MG tablet TAKE HALF TABLET BY MOUTH EVERY MONDAY, WEDNESDAY, [place]. What changed: See the new instructions.   dextrose 50 % solution Dextrose 50%   DIALYVITE TABLET Tabs Take 1 tablet by mouth daily.   diphenhydrAMINE 25 MG tablet Commonly known as: BENADRYL Take 25 mg by mouth daily as needed for itching.   docusate sodium 100 MG capsule Commonly known as: Colace Take 1 capsule (100 mg total) by mouth daily as needed.   Eliquis 5 MG Tabs tablet Generic drug: apixaban Take 5 mg by mouth 2 (two) times daily.   ethyl chloride spray Apply 1 application topically daily as needed (port access).   gabapentin  100 MG capsule Commonly known as: NEURONTIN Take 100 mg by mouth at bedtime.   methocarbamol 500 MG tablet Commonly known as: Robaxin Take 1 tablet (500 mg total) by mouth 2 (two) times daily as needed. To be taken after surgery   midodrine 10 MG tablet Commonly known as: PROAMATINE Take 1 tablet (10 mg total) by mouth 3 (three) times daily with meals.   MIRCERA IJ Mircera   Muscle Rub 10-15 % Crea Apply 1 application topically as needed for muscle pain.   oxyCODONE 5 MG immediate release tablet Commonly known as: Roxicodone Take 1 tablet (5 mg total) by mouth every 6 (six) hours as needed for severe pain.   Velphoro 500 MG chewable tablet Generic drug: sucroferric oxyhydroxide Chew 500-1,000 mg by mouth See admin instructions. Take 1000 mg 3 times daily with meals and 500 mg daily with a snack.            Durable Medical Equipment  (From admission, onward)         Start     Ordered   11/23/20 1639  DME Walker rolling  Once       Question:  Patient needs a walker to treat with the following condition  Answer:  History of hip replacement   11/23/20 1638   11/23/20 1639  DME 3 n 1  Once        11/23/20 1638   11/23/20 1639  DME Bedside commode  Once  Question:  Patient needs a bedside commode to treat with the following condition  Answer:  History of hip replacement   11/23/20 1638          Discharge Assessment: Vitals:   11/29/20 1153 11/29/20 1319  BP: (!) 101/40 (!) 93/52  Pulse: 97 86  Resp: 17 16  Temp: 98.1 F (36.7 C) 98.4 F (36.9 C)  SpO2: 98% 100%   Skin clean, dry and intact without evidence of skin break down, no evidence of skin tears noted. IV catheter discontinued intact. Site without signs and symptoms of complications - no redness or edema noted at insertion site, patient denies c/o pain - only slight tenderness at site.  Dressing with slight pressure applied.  D/c Instructions-Education: Discharge instructions given to  patient/family with verbalized understanding. D/c education completed with patient/family including follow up instructions, medication list, d/c activities limitations if indicated, with other d/c instructions as indicated by MD - patient able to verbalize understanding, all questions fully answered. Patient instructed to return to ED, call 911, or call MD for any changes in condition.  Patient escorted via WC, and D/C to [name] via ptar.  Brenda Ina, RN 11/29/2020 1:55 PM

## 2020-11-30 DIAGNOSIS — U071 COVID-19: Secondary | ICD-10-CM | POA: Diagnosis not present

## 2020-11-30 DIAGNOSIS — E1122 Type 2 diabetes mellitus with diabetic chronic kidney disease: Secondary | ICD-10-CM | POA: Diagnosis not present

## 2020-11-30 DIAGNOSIS — M1612 Unilateral primary osteoarthritis, left hip: Secondary | ICD-10-CM | POA: Diagnosis not present

## 2020-11-30 DIAGNOSIS — N186 End stage renal disease: Secondary | ICD-10-CM | POA: Diagnosis not present

## 2020-12-04 DIAGNOSIS — N186 End stage renal disease: Secondary | ICD-10-CM | POA: Diagnosis not present

## 2020-12-07 ENCOUNTER — Inpatient Hospital Stay: Payer: Medicare Other | Admitting: Physician Assistant

## 2020-12-07 DIAGNOSIS — U071 COVID-19: Secondary | ICD-10-CM | POA: Insufficient documentation

## 2020-12-07 DIAGNOSIS — Z1152 Encounter for screening for COVID-19: Secondary | ICD-10-CM | POA: Insufficient documentation

## 2020-12-11 ENCOUNTER — Other Ambulatory Visit: Payer: Medicare Other | Admitting: Orthotics

## 2020-12-21 ENCOUNTER — Ambulatory Visit (INDEPENDENT_AMBULATORY_CARE_PROVIDER_SITE_OTHER): Payer: Medicare Other

## 2020-12-21 ENCOUNTER — Encounter: Payer: Self-pay | Admitting: Orthopaedic Surgery

## 2020-12-21 ENCOUNTER — Ambulatory Visit (INDEPENDENT_AMBULATORY_CARE_PROVIDER_SITE_OTHER): Payer: Medicare Other | Admitting: Orthopaedic Surgery

## 2020-12-21 DIAGNOSIS — Z96641 Presence of right artificial hip joint: Secondary | ICD-10-CM

## 2020-12-21 DIAGNOSIS — M1612 Unilateral primary osteoarthritis, left hip: Secondary | ICD-10-CM | POA: Diagnosis not present

## 2020-12-21 NOTE — Progress Notes (Signed)
Post-Op Visit Note   Patient: Brenda Arnold           Date of Birth: 21-Oct-1956           MRN: [id number] Visit Date: 12/21/2020 PCP: Kelton Pillar, MD   Assessment & Plan:  Chief Complaint:  Chief Complaint  Patient presents with  . Left Hip - Pain   Visit Diagnoses:  1. Primary osteoarthritis of left hip   2. Status post total replacement of right hip     Plan: Patient is a pleasant 65 year old female who comes in today 4 weeks out left total hip replacement.  She is late for her postoperative appointment as she has been in a rehab facility and tested positive for COVID a few weeks ago.  She has remained asymptomatic, however.  She has been doing well in regards to the left hip.  She has been getting physical therapy and is ambulating with a walker while at rehab.  Examination of her left hip reveals a well-healing surgical incision with nylon sutures in place.  No evidence of infection or cellulitis.  Calves are soft and nontender.  She is neurovascular intact distally.  Today, sutures were removed and Steri-Strips applied.  Dental prophylaxis reinforced.  She will follow-up with [place] in 4 weeks time for repeat evaluation.  Call with concerns or questions.  Follow-Up Instructions: Return in about 4 weeks (around 01/18/2021).   Orders:  Orders Placed This Encounter  Procedures  . XR HIP UNILAT W OR W/O PELVIS 2-3 VIEWS LEFT   No orders of the defined types were placed in this encounter.   Imaging: No results found.  PMFS History: Patient Active Problem List   Diagnosis Date Noted  . S/P total left hip arthroplasty 11/25/2020  . Primary osteoarthritis of left hip 11/23/2020  . Status post total replacement of left hip 11/23/2020  . Diarrhea, unspecified 09/06/2020  . Headache, unspecified 07/11/2020  . Subacute osteomyelitis of right hand ([place]) 01/27/2020  . Finger infection 01/26/2020  . ESRD (end stage renal disease) ([place]) 01/13/2020  . ESRD on dialysis ([place])    . Physical debility 09/09/2019  . Pericardial effusion   . SOB (shortness of breath)   . Leukocytosis   . Anemia of chronic disease   . PAF (paroxysmal atrial fibrillation) ([place])   . Postoperative pain   . Cardiac tamponade   . Chest pain at rest 08/26/2019  . Tachycardia 08/26/2019  . Pericarditis 08/26/2019  . Angina pectoris, unspecified ([place]) 08/25/2019  . CAD (coronary artery disease) 11/05/2018  . A-fib ([place]) 11/05/2018  . Atrial fibrillation with RVR ([place])   . Hypotension 11/04/2018  . ESRD on hemodialysis ([place]) 11/04/2018  . DVT (deep venous thrombosis) ([place]) 11/03/2018  . Hyperkalemia 10/02/2018  . Status post total replacement of right hip 09/15/2018  . Varicose veins of bilateral lower extremities with other complications 0000000  . Hypercalcemia 05/17/2017  . Secondary hyperparathyroidism of renal origin ([place]) 02/21/2017  . Pruritus, unspecified 02/13/2017  . Pain, unspecified 02/13/2017  . Other specified coagulation defects ([place]) 02/13/2017  . Iron deficiency anemia, unspecified 02/13/2017  . Anticoagulation goal of INR 2 to 3 09/30/2014  . Pain in lower limb 05/02/2014  . Onychomycosis due to dermatophyte 04/06/2013  . Pain in joint, ankle and foot 04/06/2013  . Bradycardia 11/06/2012  . Left-sided epistaxis 11/05/2012  . DM (diabetes mellitus) ([place]) 11/05/2012  . OSA (obstructive sleep apnea) 11/05/2012  . Acute posterior epistaxis 11/05/2012  . CKD (chronic kidney  disease) 11/05/2012  . End stage renal disease ([place]) 06/25/2012  . Type 2 diabetes mellitus with complication, without long-term current use of insulin ([place]) 12/20/2009  . OBESITY 12/20/2009  . OBESITY-MORBID (>100') 12/20/2009  . Essential hypertension 12/20/2009  . Paroxysmal atrial fibrillation ([place]) 12/20/2009  . Chest pain 12/20/2009  . ABNORMAL CV (STRESS) TEST 12/20/2009   Past Medical History:  Diagnosis Date  . Anemia   . Arthritis   . Asthma   . Complication of anesthesia     difficulty with getting oxygen saturation up- "patient was not aware"  Bottom of feet burning in PACU  . Constipation    because of Iron  . Diabetes mellitus    not on meds  . DVT (deep venous thrombosis) ([place]) 2019   post hip replacement - right  . Dyslipidemia   . Epistaxis 11/05/2012  . ESRD (end stage renal disease) on dialysis [facility])    "MWF; Jeneen Rinks" (09/15/2018)- started 02/13/2017  . History of blood transfusion   . HTN (hypertension)   . Hyperparathyroidism due to renal insufficiency ([place])   . Obesity    s/p panniculectomy  . Paroxysmal A-fib ([facility])    a. chronic coumadin;  b. 12/2009 Echo: EF 60-65%, Gr 1 DD.  Marland Kitchen PCOS (polycystic ovarian syndrome)   . Pericarditis 08/2019   pericarditis with pericardial effusion, s/p right VATS/pericardial window 08/31/19  . Pneumonia   . Seasonal allergies   . Sleep apnea    a. not using CPAP, last study  >8 yrs  . Vitamin D deficiency     Family History  Problem Relation Age of Onset  . Lung cancer Father        died @ 23  . Hypertension Mother   . Diabetes Brother   . Kidney disease Brother     Past Surgical History:  Procedure Laterality Date  . ABDOMINAL HYSTERECTOMY     with panniculctomy  . AV FISTULA PLACEMENT  09/02/2012   Procedure: ARTERIOVENOUS (AV) FISTULA CREATION;  Surgeon: Elam Dutch, MD;  Location: [facility];  Service: Vascular;  Laterality: Left;  Creation of Left Radial-Cephalic Fistula   . BREAST SURGERY     Biopsy right breast  . COLONOSCOPY    . COLONOSCOPY W/ BIOPSIES AND POLYPECTOMY    . DILATION AND CURETTAGE OF UTERUS    . KNEE ARTHROSCOPY Right   . PANNICULECTOMY    . REVISON OF ARTERIOVENOUS FISTULA Left 04/27/2014   Procedure: REVISON OF LEFT RADIAL-CEPHALIC ARTERIOVENOUS FISTULA;  Surgeon: Mal Misty, MD;  Location: [place];  Service: Vascular;  Laterality: Left;  . REVISON OF ARTERIOVENOUS FISTULA Left 01/13/2020   Procedure: REVISON OF ARTERIOVENOUS FISTULA;  Surgeon: Marty Heck, MD;  Location: [place];  Service: Vascular;  Laterality: Left;  . TOTAL HIP ARTHROPLASTY Right 09/15/2018  . TOTAL HIP ARTHROPLASTY Right 09/15/2018   Procedure: RIGHT TOTAL HIP ARTHROPLASTY ANTERIOR APPROACH;  Surgeon: Leandrew Koyanagi, MD;  Location: [place];  Service: Orthopedics;  Laterality: Right;  . TOTAL HIP ARTHROPLASTY Left 11/23/2020   Procedure: LEFT TOTAL HIP ARTHROPLASTY ANTERIOR APPROACH;  Surgeon: Leandrew Koyanagi, MD;  Location: [place];  Service: Orthopedics;  Laterality: Left;  NEEDS RNFA PLEASE  . UVULOPLASTY    . VIDEO ASSISTED THORACOSCOPY (VATS)/WEDGE RESECTION Right 08/31/2019   Procedure: VIDEO ASSISTED THORACOSCOPY/ DRAINAGE OF PERICARDIAL EFFUSION/ PERICARDIAL WINDOW/ ABORTED PERICARDIOCENTESIS ;  Surgeon: Lajuana Matte, MD;  Location: [place];  Service: Thoracic;  Laterality: Right;   Social  History   Occupational History  . Occupation: Dance movement psychotherapist.    Employer: [employer]  Tobacco Use  . Smoking status: Never Smoker  . Smokeless tobacco: Never Used  Vaping Use  . Vaping Use: Never used  Substance and Sexual Activity  . Alcohol use: No    Alcohol/week: 0.0 standard drinks  . Drug use: No  . Sexual activity: Not on file    Comment: Hysterectomy

## 2021-01-02 DIAGNOSIS — E876 Hypokalemia: Secondary | ICD-10-CM | POA: Diagnosis not present

## 2021-01-02 DIAGNOSIS — D509 Iron deficiency anemia, unspecified: Secondary | ICD-10-CM | POA: Diagnosis not present

## 2021-01-02 DIAGNOSIS — N2581 Secondary hyperparathyroidism of renal origin: Secondary | ICD-10-CM | POA: Diagnosis not present

## 2021-01-02 DIAGNOSIS — N186 End stage renal disease: Secondary | ICD-10-CM | POA: Diagnosis not present

## 2021-01-02 DIAGNOSIS — D631 Anemia in chronic kidney disease: Secondary | ICD-10-CM | POA: Diagnosis not present

## 2021-01-03 DIAGNOSIS — N186 End stage renal disease: Secondary | ICD-10-CM | POA: Diagnosis not present

## 2021-01-03 DIAGNOSIS — D631 Anemia in chronic kidney disease: Secondary | ICD-10-CM | POA: Diagnosis not present

## 2021-01-03 DIAGNOSIS — E876 Hypokalemia: Secondary | ICD-10-CM | POA: Diagnosis not present

## 2021-01-03 DIAGNOSIS — Z992 Dependence on renal dialysis: Secondary | ICD-10-CM | POA: Diagnosis not present

## 2021-01-03 DIAGNOSIS — N2581 Secondary hyperparathyroidism of renal origin: Secondary | ICD-10-CM | POA: Diagnosis not present

## 2021-01-03 DIAGNOSIS — D509 Iron deficiency anemia, unspecified: Secondary | ICD-10-CM | POA: Diagnosis not present

## 2021-01-09 ENCOUNTER — Ambulatory Visit: Payer: Medicare Other | Admitting: Podiatry

## 2021-01-10 ENCOUNTER — Ambulatory Visit: Payer: Medicare Other | Admitting: Podiatry

## 2021-01-18 ENCOUNTER — Other Ambulatory Visit: Payer: Self-pay

## 2021-01-18 ENCOUNTER — Ambulatory Visit (INDEPENDENT_AMBULATORY_CARE_PROVIDER_SITE_OTHER): Payer: Medicare Other | Admitting: Orthopaedic Surgery

## 2021-01-18 DIAGNOSIS — Z96642 Presence of left artificial hip joint: Secondary | ICD-10-CM

## 2021-01-18 NOTE — Progress Notes (Signed)
Post-Op Visit Note   Patient: Brenda Arnold           Date of Birth: 01-22-1956           MRN: [id number] Visit Date: 01/18/2021 PCP: Kelton Pillar, MD   Assessment & Plan:  Chief Complaint:  Chief Complaint  Patient presents with  . Left Hip - Routine Post Op   Visit Diagnoses:  1. Status post total replacement of left hip     Plan:   [name] is a week status post left total hip placement.  Doing well.  No complaints.  Walking well overall.  She continues to feel improvement in range of motion and strength.  She still getting home health PT.  Left hip shows fully healed surgical scar.  No sign of infection.  No swelling.  [name] is doing well a week status post left total hip placement.  With physical therapy to get stronger.  Dental prophylaxis reinforced.  Recheck in 6 weeks with standing AP pelvis and lateral x-rays.  Follow-Up Instructions: Return in about 6 weeks (around 03/01/2021).   Orders:  No orders of the defined types were placed in this encounter.  No orders of the defined types were placed in this encounter.   Imaging: No results found.  PMFS History: Patient Active Problem List   Diagnosis Date Noted  . S/P total left hip arthroplasty 11/25/2020  . Primary osteoarthritis of left hip 11/23/2020  . Status post total replacement of left hip 11/23/2020  . Diarrhea, unspecified 09/06/2020  . Headache, unspecified 07/11/2020  . Subacute osteomyelitis of right hand ([place]) 01/27/2020  . Finger infection 01/26/2020  . ESRD (end stage renal disease) ([place]) 01/13/2020  . ESRD on dialysis ([place])   . Physical debility 09/09/2019  . Pericardial effusion   . SOB (shortness of breath)   . Leukocytosis   . Anemia of chronic disease   . PAF (paroxysmal atrial fibrillation) ([place])   . Postoperative pain   . Cardiac tamponade   . Chest pain at rest 08/26/2019  . Tachycardia 08/26/2019  . Pericarditis 08/26/2019  . Angina pectoris, unspecified ([place])  08/25/2019  . CAD (coronary artery disease) 11/05/2018  . A-fib ([place]) 11/05/2018  . Atrial fibrillation with RVR ([place])   . Hypotension 11/04/2018  . ESRD on hemodialysis ([place]) 11/04/2018  . DVT (deep venous thrombosis) ([place]) 11/03/2018  . Hyperkalemia 10/02/2018  . Status post total replacement of right hip 09/15/2018  . Varicose veins of bilateral lower extremities with other complications 0000000  . Hypercalcemia 05/17/2017  . Secondary hyperparathyroidism of renal origin ([place]) 02/21/2017  . Pruritus, unspecified 02/13/2017  . Pain, unspecified 02/13/2017  . Other specified coagulation defects ([place]) 02/13/2017  . Iron deficiency anemia, unspecified 02/13/2017  . Anticoagulation goal of INR 2 to 3 09/30/2014  . Pain in lower limb 05/02/2014  . Onychomycosis due to dermatophyte 04/06/2013  . Pain in joint, ankle and foot 04/06/2013  . Bradycardia 11/06/2012  . Left-sided epistaxis 11/05/2012  . DM (diabetes mellitus) ([place]) 11/05/2012  . OSA (obstructive sleep apnea) 11/05/2012  . Acute posterior epistaxis 11/05/2012  . CKD (chronic kidney disease) 11/05/2012  . End stage renal disease ([place]) 06/25/2012  . Type 2 diabetes mellitus with complication, without long-term current use of insulin ([place]) 12/20/2009  . OBESITY 12/20/2009  . OBESITY-MORBID (>100') 12/20/2009  . Essential hypertension 12/20/2009  . Paroxysmal atrial fibrillation ([place]) 12/20/2009  . Chest pain 12/20/2009  . ABNORMAL CV (STRESS) TEST 12/20/2009   Past Medical  History:  Diagnosis Date  . Anemia   . Arthritis   . Asthma   . Complication of anesthesia    difficulty with getting oxygen saturation up- "patient was not aware"  Bottom of feet burning in PACU  . Constipation    because of Iron  . Diabetes mellitus    not on meds  . DVT (deep venous thrombosis) ([place]) 2019   post hip replacement - right  . Dyslipidemia   . Epistaxis 11/05/2012  . ESRD (end stage renal disease) on dialysis [facility])    "MWF;  Jeneen Rinks" (09/15/2018)- started 02/13/2017  . History of blood transfusion   . HTN (hypertension)   . Hyperparathyroidism due to renal insufficiency ([place])   . Obesity    s/p panniculectomy  . Paroxysmal A-fib ([facility])    a. chronic coumadin;  b. 12/2009 Echo: EF 60-65%, Gr 1 DD.  Marland Kitchen PCOS (polycystic ovarian syndrome)   . Pericarditis 08/2019   pericarditis with pericardial effusion, s/p right VATS/pericardial window 08/31/19  . Pneumonia   . Seasonal allergies   . Sleep apnea    a. not using CPAP, last study  >8 yrs  . Vitamin D deficiency     Family History  Problem Relation Age of Onset  . Lung cancer Father        died @ 62  . Hypertension Mother   . Diabetes Brother   . Kidney disease Brother     Past Surgical History:  Procedure Laterality Date  . ABDOMINAL HYSTERECTOMY     with panniculctomy  . AV FISTULA PLACEMENT  09/02/2012   Procedure: ARTERIOVENOUS (AV) FISTULA CREATION;  Surgeon: Elam Dutch, MD;  Location: [facility] OR;  Service: Vascular;  Laterality: Left;  Creation of Left Radial-Cephalic Fistula   . BREAST SURGERY     Biopsy right breast  . COLONOSCOPY    . COLONOSCOPY W/ BIOPSIES AND POLYPECTOMY    . DILATION AND CURETTAGE OF UTERUS    . KNEE ARTHROSCOPY Right   . PANNICULECTOMY    . REVISON OF ARTERIOVENOUS FISTULA Left 04/27/2014   Procedure: REVISON OF LEFT RADIAL-CEPHALIC ARTERIOVENOUS FISTULA;  Surgeon: Mal Misty, MD;  Location: [place];  Service: Vascular;  Laterality: Left;  . REVISON OF ARTERIOVENOUS FISTULA Left 01/13/2020   Procedure: REVISON OF ARTERIOVENOUS FISTULA;  Surgeon: Marty Heck, MD;  Location: [place];  Service: Vascular;  Laterality: Left;  . TOTAL HIP ARTHROPLASTY Right 09/15/2018  . TOTAL HIP ARTHROPLASTY Right 09/15/2018   Procedure: RIGHT TOTAL HIP ARTHROPLASTY ANTERIOR APPROACH;  Surgeon: Leandrew Koyanagi, MD;  Location: [place];  Service: Orthopedics;  Laterality: Right;  . TOTAL HIP ARTHROPLASTY Left 11/23/2020   Procedure:  LEFT TOTAL HIP ARTHROPLASTY ANTERIOR APPROACH;  Surgeon: Leandrew Koyanagi, MD;  Location: [place];  Service: Orthopedics;  Laterality: Left;  NEEDS RNFA PLEASE  . UVULOPLASTY    . VIDEO ASSISTED THORACOSCOPY (VATS)/WEDGE RESECTION Right 08/31/2019   Procedure: VIDEO ASSISTED THORACOSCOPY/ DRAINAGE OF PERICARDIAL EFFUSION/ PERICARDIAL WINDOW/ ABORTED PERICARDIOCENTESIS ;  Surgeon: Lajuana Matte, MD;  Location: [facility] OR;  Service: Thoracic;  Laterality: Right;   Social History   Occupational History  . Occupation: Dance movement psychotherapist.    Employer: [employer]  Tobacco Use  . Smoking status: Never Smoker  . Smokeless tobacco: Never Used  Vaping Use  . Vaping Use: Never used  Substance and Sexual Activity  . Alcohol use: No    Alcohol/week: 0.0 standard drinks  . Drug use: No  .  Sexual activity: Not on file    Comment: Hysterectomy

## 2021-01-19 ENCOUNTER — Other Ambulatory Visit: Payer: Medicare Other | Admitting: Orthotics

## 2021-01-22 ENCOUNTER — Other Ambulatory Visit: Payer: Self-pay

## 2021-01-22 ENCOUNTER — Ambulatory Visit (INDEPENDENT_AMBULATORY_CARE_PROVIDER_SITE_OTHER): Payer: Medicare Other | Admitting: Podiatry

## 2021-01-22 ENCOUNTER — Encounter: Payer: Self-pay | Admitting: Podiatry

## 2021-01-22 DIAGNOSIS — M79674 Pain in right toe(s): Secondary | ICD-10-CM

## 2021-01-22 DIAGNOSIS — B351 Tinea unguium: Secondary | ICD-10-CM | POA: Diagnosis not present

## 2021-01-22 DIAGNOSIS — M79675 Pain in left toe(s): Secondary | ICD-10-CM | POA: Diagnosis not present

## 2021-01-22 DIAGNOSIS — N186 End stage renal disease: Secondary | ICD-10-CM

## 2021-01-22 DIAGNOSIS — E1121 Type 2 diabetes mellitus with diabetic nephropathy: Secondary | ICD-10-CM | POA: Insufficient documentation

## 2021-01-22 DIAGNOSIS — Z992 Dependence on renal dialysis: Secondary | ICD-10-CM

## 2021-01-22 DIAGNOSIS — L84 Corns and callosities: Secondary | ICD-10-CM

## 2021-01-22 DIAGNOSIS — I132 Hypertensive heart and chronic kidney disease with heart failure and with stage 5 chronic kidney disease, or end stage renal disease: Secondary | ICD-10-CM | POA: Insufficient documentation

## 2021-01-22 DIAGNOSIS — M2042 Other hammer toe(s) (acquired), left foot: Secondary | ICD-10-CM

## 2021-01-22 DIAGNOSIS — M2041 Other hammer toe(s) (acquired), right foot: Secondary | ICD-10-CM

## 2021-01-22 DIAGNOSIS — E0822 Diabetes mellitus due to underlying condition with diabetic chronic kidney disease: Secondary | ICD-10-CM

## 2021-01-22 DIAGNOSIS — E1140 Type 2 diabetes mellitus with diabetic neuropathy, unspecified: Secondary | ICD-10-CM | POA: Insufficient documentation

## 2021-01-22 DIAGNOSIS — E7800 Pure hypercholesterolemia, unspecified: Secondary | ICD-10-CM | POA: Insufficient documentation

## 2021-01-22 DIAGNOSIS — I5032 Chronic diastolic (congestive) heart failure: Secondary | ICD-10-CM | POA: Insufficient documentation

## 2021-01-22 DIAGNOSIS — E11319 Type 2 diabetes mellitus with unspecified diabetic retinopathy without macular edema: Secondary | ICD-10-CM | POA: Insufficient documentation

## 2021-01-22 DIAGNOSIS — I700 Atherosclerosis of aorta: Secondary | ICD-10-CM | POA: Insufficient documentation

## 2021-01-22 DIAGNOSIS — J45909 Unspecified asthma, uncomplicated: Secondary | ICD-10-CM | POA: Insufficient documentation

## 2021-01-22 DIAGNOSIS — D6869 Other thrombophilia: Secondary | ICD-10-CM | POA: Insufficient documentation

## 2021-01-22 DIAGNOSIS — H9190 Unspecified hearing loss, unspecified ear: Secondary | ICD-10-CM | POA: Insufficient documentation

## 2021-01-25 ENCOUNTER — Telehealth: Payer: Self-pay

## 2021-01-25 NOTE — Telephone Encounter (Signed)
yes

## 2021-01-25 NOTE — Telephone Encounter (Signed)
Okay for work note? 

## 2021-01-25 NOTE — Telephone Encounter (Signed)
Patient called stating she had a fall yesterday.  Stated that she is able to walk, but is having a little pain.  Spoke with Dr. Erlinda Hong and he advised for patient to treat her pain and if not better, then make an appointment to come into the office.   Advised patient and she voiced that she understands.  Patient would like a letter to return to work on January 30, 2021 sent to her email at [email or web address].  CB# [phone].  Please advise.  Thank You.

## 2021-01-26 NOTE — Telephone Encounter (Signed)
NOTE MADE.

## 2021-01-26 NOTE — Telephone Encounter (Signed)
Emailed

## 2021-01-27 NOTE — Progress Notes (Addendum)
Subjective: Brenda Arnold presents today for at risk foot care with h/o NIDDM with ESRD on hemodialysis and callus(es) b/l and painful thick toenails that are difficult to trim. Painful toenails interfere with ambulation. Aggravating factors include wearing enclosed shoe gear. Pain is relieved with periodic professional debridement. Painful calluses are aggravated when weightbearing with and without shoegear. Pain is relieved with periodic professional debridement.   She did have hip surgery in December and is doing well with recovery.   Patient does have symptoms of foot numbness. Patient did not check blood glucose this morning.  Kelton Pillar, MD is patient's PCP. Last visit was 09/18/2020.   Allergies  Allergen Reactions  . Iodine Shortness Of Breath  . Other Shortness Of Breath and Anaphylaxis  . Shellfish Allergy Shortness Of Breath  . Amlodipine Swelling    Other reaction(s): edema  . Lisinopril     Other reaction(s): cough     Review of Systems: Negative except as noted in the HPI.  Objective: There were no vitals filed for this visit.  Brenda Arnold is a pleasant 64 y.o. African American female in NAD. AAO X 3.  Vascular Examination: Capillary fill time to digits <3 seconds b/l lower extremities. Palpable DP pulse(s) b/l lower extremities Nonpalpable PT pulse(s) b/l lower extremities. Pedal hair absent. Lower extremity skin temperature gradient within normal limits. No pain with calf compression b/l. No edema noted b/l lower extremities. No ischemia or gangrene noted b/l lower extremities.  Dermatological Examination: Pedal skin with normal turgor, texture and tone bilaterally. No open wounds bilaterally. No interdigital macerations bilaterally. Toenails 1-5 b/l elongated, discolored, dystrophic, thickened, crumbly with subungual debris and tenderness to dorsal palpation. Hyperkeratotic lesion(s) submet head 5 left foot and submet head 5 right foot.  No erythema, no  edema, no drainage, no flocculence.  Musculoskeletal Examination: Normal muscle strength 5/5 to all lower extremity muscle groups bilaterally. Hammertoes noted to the 1-5 bilaterally.   Neurological Examination: Pt has subjective symptoms of neuropathy. Protective sensation intact 5/5 intact bilaterally with 10g monofilament b/l. Vibratory sensation intact b/l.  Assessment: 1. Pain due to onychomycosis of toenails of both feet   2. Callus   3. Acquired hammertoes of both feet   4. Diabetes mellitus due to underlying condition with chronic kidney disease on chronic dialysis, without long-term current use of insulin ([place])      Plan: -Examined patient. -Continue diabetic foot care principles. -Patient to continue soft, supportive shoe gear daily. Start procedure for diabetic shoes. Patient qualifies based on diagnoses. -Toenails 1-5 b/l were debrided in length and girth with sterile nail nippers and dremel without iatrogenic bleeding.  -Callus(es) submet head 5 left foot and submet head 5 right foot pared utilizing sterile scalpel blade without complication or incident. Total number debrided =2. -Patient to report any pedal injuries to medical professional immediately. -Patient/POA to call should there be question/concern in the interim.  Return in about 3 months (around 04/21/2021).  Marzetta Board, DPM

## 2021-01-29 ENCOUNTER — Other Ambulatory Visit: Payer: Self-pay

## 2021-01-29 ENCOUNTER — Ambulatory Visit (INDEPENDENT_AMBULATORY_CARE_PROVIDER_SITE_OTHER): Payer: Medicare Other | Admitting: Podiatry

## 2021-01-29 ENCOUNTER — Encounter: Payer: Self-pay | Admitting: Podiatry

## 2021-01-29 DIAGNOSIS — M2041 Other hammer toe(s) (acquired), right foot: Secondary | ICD-10-CM

## 2021-01-29 DIAGNOSIS — M2042 Other hammer toe(s) (acquired), left foot: Secondary | ICD-10-CM

## 2021-01-29 DIAGNOSIS — E119 Type 2 diabetes mellitus without complications: Secondary | ICD-10-CM

## 2021-01-29 NOTE — Progress Notes (Signed)
Subjective: Patient presents for Diabetic shoes and custom molded inserts.  Objective: Patient presented for a foam casting for 3 pair custom diabetic shoe inserts and is measured with a brannok device to be a size 9 1/2 wide. Patient looked at the safe step catalog and chose shoes [id number]  A: Hammer toe of both feet, diabetes mellitus due to underlying condition with chronic kidney disease on chronic dialysis with out long term current use of insulin.  Plan: The patient will be contracted when the shoes and inserts are ready to be picked up.

## 2021-01-30 DIAGNOSIS — N186 End stage renal disease: Secondary | ICD-10-CM | POA: Diagnosis not present

## 2021-01-30 DIAGNOSIS — D631 Anemia in chronic kidney disease: Secondary | ICD-10-CM | POA: Diagnosis not present

## 2021-01-30 DIAGNOSIS — N2581 Secondary hyperparathyroidism of renal origin: Secondary | ICD-10-CM | POA: Diagnosis not present

## 2021-01-30 DIAGNOSIS — Z23 Encounter for immunization: Secondary | ICD-10-CM | POA: Diagnosis not present

## 2021-01-30 DIAGNOSIS — E1122 Type 2 diabetes mellitus with diabetic chronic kidney disease: Secondary | ICD-10-CM | POA: Diagnosis not present

## 2021-01-30 DIAGNOSIS — D509 Iron deficiency anemia, unspecified: Secondary | ICD-10-CM | POA: Diagnosis not present

## 2021-01-31 DIAGNOSIS — D509 Iron deficiency anemia, unspecified: Secondary | ICD-10-CM | POA: Diagnosis not present

## 2021-01-31 DIAGNOSIS — Z992 Dependence on renal dialysis: Secondary | ICD-10-CM | POA: Diagnosis not present

## 2021-01-31 DIAGNOSIS — N2581 Secondary hyperparathyroidism of renal origin: Secondary | ICD-10-CM | POA: Diagnosis not present

## 2021-01-31 DIAGNOSIS — Z23 Encounter for immunization: Secondary | ICD-10-CM | POA: Diagnosis not present

## 2021-01-31 DIAGNOSIS — N186 End stage renal disease: Secondary | ICD-10-CM | POA: Diagnosis not present

## 2021-01-31 DIAGNOSIS — D631 Anemia in chronic kidney disease: Secondary | ICD-10-CM | POA: Diagnosis not present

## 2021-02-07 ENCOUNTER — Other Ambulatory Visit: Payer: Self-pay

## 2021-02-07 ENCOUNTER — Telehealth: Payer: Self-pay | Admitting: Orthopaedic Surgery

## 2021-02-07 DIAGNOSIS — Z96642 Presence of left artificial hip joint: Secondary | ICD-10-CM

## 2021-02-07 NOTE — Telephone Encounter (Signed)
Ok please order outpatient PT for her.  Thanks.

## 2021-02-07 NOTE — Telephone Encounter (Signed)
Brenda Arnold with  [facility] states they will be discharging the pt from home PT but believes she will benefit from outpatient PTand the pt agrees. [name] would like our office to update the pt on what Dr. Erlinda Hong decides what he will do.   [phone] Brenda Arnold CB# [phone]

## 2021-02-07 NOTE — Telephone Encounter (Signed)
Called Sireesha back to advise per MD he does want patient to do outpatient PT.  Patient aware.

## 2021-02-21 ENCOUNTER — Other Ambulatory Visit: Payer: Self-pay

## 2021-02-21 ENCOUNTER — Ambulatory Visit: Payer: BC Managed Care – PPO

## 2021-02-21 ENCOUNTER — Encounter: Payer: Self-pay | Admitting: *Deleted

## 2021-02-21 ENCOUNTER — Encounter: Payer: Self-pay | Admitting: Cardiology

## 2021-02-21 ENCOUNTER — Ambulatory Visit (INDEPENDENT_AMBULATORY_CARE_PROVIDER_SITE_OTHER): Payer: BC Managed Care – PPO | Admitting: Cardiology

## 2021-02-21 VITALS — BP 90/60 | HR 100 | Ht 66.0 in | Wt 234.0 lb

## 2021-02-21 DIAGNOSIS — I480 Paroxysmal atrial fibrillation: Secondary | ICD-10-CM

## 2021-02-21 DIAGNOSIS — I319 Disease of pericardium, unspecified: Secondary | ICD-10-CM

## 2021-02-21 DIAGNOSIS — E785 Hyperlipidemia, unspecified: Secondary | ICD-10-CM

## 2021-02-21 DIAGNOSIS — E119 Type 2 diabetes mellitus without complications: Secondary | ICD-10-CM | POA: Diagnosis not present

## 2021-02-21 DIAGNOSIS — N186 End stage renal disease: Secondary | ICD-10-CM

## 2021-02-21 NOTE — Progress Notes (Signed)
Patient ID: Brenda Arnold, female   DOB: Jan 29, 1956, 65 y.o.   MRN: [id number] Patient enrolled for [employer] to ship a 14 day ZIO XT long term holter monitor to her home.

## 2021-02-21 NOTE — Patient Instructions (Signed)
Medication Instructions:  Your physician recommends that you continue on your current medications as directed. Please refer to the Current Medication list given to you today.  *If you need a refill on your cardiac medications before your next appointment, please call your pharmacy*  Testing/Procedures: Your physician has recommended that you wear an event monitor. Event monitors are medical devices that record the heart's electrical activity. Doctors most often [place] these monitors to diagnose arrhythmias. Arrhythmias are problems with the speed or rhythm of the heartbeat. The monitor is a small, portable device. You can wear one while you do your normal daily activities. This is usually used to diagnose what is causing palpitations/syncope (passing out).  Follow-Up: At [facility], you and your health needs are our priority.  As part of our continuing mission to provide you with exceptional heart care, we have created designated Provider Care Teams.  These Care Teams include your primary Cardiologist (physician) and Advanced Practice Providers (APPs -  Physician Assistants and Nurse Practitioners) who all work together to provide you with the care you need, when you need it.  Your next appointment:   1 year(s)  The format for your next appointment:   In Person  Provider:   You may see Fransico Him, MD or one of the following Advanced Practice Providers on your designated Care Team:    Melina Copa, PA-C  Ermalinda Barrios, PA-C  Other Instructions Bryn Gulling- Long Term Monitor Instructions   Your physician has requested you wear your ZIO patch monitor_14_days.   This is a single patch monitor.  [employer] supplies one patch monitor per enrollment.  Additional stickers are not available.   Please do not apply patch if you will be having a Nuclear Stress Test, Echocardiogram, Cardiac CT, MRI, or Chest Xray during the time frame you would be wearing the monitor. The patch cannot be worn during these  tests.  You cannot remove and re-apply the ZIO XT patch monitor.   Your ZIO patch monitor will be sent USPS Priority mail from [facility] directly to your home address. The monitor may also be mailed to a PO BOX if home delivery is not available.   It may take 3-5 days to receive your monitor after you have been enrolled.   Once you have received you monitor, please review enclosed instructions.  Your monitor has already been registered assigning a specific monitor serial # to you.   Applying the monitor   Shave hair from upper left chest.   Hold abrader disc by orange tab.  Rub abrader in 40 strokes over left upper chest as indicated in your monitor instructions.   Clean area with 4 enclosed alcohol pads .  Use all pads to assure are is cleaned thoroughly.  Let dry.   Apply patch as indicated in monitor instructions.  Patch will be place under collarbone on left side of chest with arrow pointing upward.   Rub patch adhesive wings for 2 minutes.Remove white label marked "1".  Remove white label marked "2".  Rub patch adhesive wings for 2 additional minutes.   While looking in a mirror, press and release button in center of patch.  A small green light will flash 3-4 times .  This will be your only indicator the monitor has been turned on.     Do not shower for the first 24 hours.  You may shower after the first 24 hours.   Press button if you feel a symptom. You will hear a  small click.  Record Date, Time and Symptom in the Patient Log Book.   When you are ready to remove patch, follow instructions on last 2 pages of Patient Log Book.  Stick patch monitor onto last page of Patient Log Book.   Place Patient Log Book in [place] box.  Use locking tab on box and tape box closed securely.  The [employer] and [employer] has [employer] on it.  Please place in mailbox as soon as possible.  Your physician should have your test results approximately 7 days after the monitor has been mailed back  to [facility].   Call [facility] at [phone] if you have questions regarding your ZIO XT patch monitor.  Call them immediately if you see an orange light blinking on your monitor.   If your monitor falls off in less than 4 days contact our Monitor department at [phone].  If your monitor becomes loose or falls off after 4 days call [employer] at [phone] for suggestions on securing your monitor.

## 2021-02-21 NOTE — Progress Notes (Signed)
Cardiology Office Note:    Date:  02/21/2021   ID:  AITANA LINSENBIGLER, DOB 03/12/1956, MRN [id number]  PCP:  Kelton Pillar, MD  Cardiologist:  Fransico Him, MD    Referring MD: Kelton Pillar, MD   Chief Complaint  Patient presents with  . Follow-up    Pericarditis, PAF, HTN    History of Present Illness:    Brenda Arnold is a 65 y.o. female with a hx of hypertension, PAF,ESRD on HD, and obesity.She had a Myoview secondary to chest pain in the setting of atrial fibrillation 2011 that showed inferolateral lateral ischemia. Heart catheterization was deferred and medical therapy was recommended.  She underwent Myoview stress test on 5/17/18which showed no reversible ischemia.    The patient was seen in the hospital in early October 2020 with symptoms consistent with acute pericarditis (pleuritc chest pain, elevated inflammatory markers, diffuse ST elevation on EKG), treated with colchicine. Initial TTE with trivial effusion. On 9/29 developed hypotension and repeat TTE showed large effusion with findings concerning for tamponade. S/p pericardial window with Dr Kipp Brood on 9/29. Cultures no growth, cytology with no malignant cells. Continued colchicine with HD.  She developed atrial fibrillation with RVR and was started on amiodarone for rhythm control and risks for stroke risk reduction.  She was maintaining sinus rhythm at the time of discharge.  She went to inpatient rehab.  She went to the ED on 10/06/2019 with complaints of pleuritic chest pain.  There were no ischemic changes on EKG and troponins were normal.  She was not felt to be volume overloaded and potassium was normal.  Seen back in the office by extender and Bp was stable on Midodrine and CP with breathing had improved but still had some pleuritic CP with deep inspiration.  She was continued on Colchicine.  She was maintaining NSR on Amio and midodrine for hypotension on HD.   I saw her 01/2020 and her Amio was  stopped as she was maintaining NSR.  Repeat 2D echo 01/2020 showed normal LVF and no pericardia effusion.  She was seen in afib clinic 05/2020 due to palpitations and recurrent afib but was back in NSR during the OV.  Unfortunately it was felt that Amio was only option given her ESRD and she was restarted on Amio '200mg'$  BID.  She was seen back in July and was not having any more palpitations. She ran out of her Amio and has been out of it for 3 months. When I saw her last she did not want to refill the Amio.   She is here today for followup and is doing well.  She denies any chest pain or pressure, SOB, DOE, PND, orthopnea, LE edema, dizziness or syncope. She says that once in a while she will have fluttering in her chest but it does not feel like atrial fibrillation.  It occurs 1-2 times daily lasting a few minutes with no associated sx. She is compliant with her meds and is tolerating meds with no SE.    Past Medical History:  Diagnosis Date  . Anemia   . Arthritis   . Asthma   . Complication of anesthesia    difficulty with getting oxygen saturation up- "patient was not aware"  Bottom of feet burning in PACU  . Constipation    because of Iron  . Diabetes mellitus    not on meds  . DVT (deep venous thrombosis) ([place]) 2019   post hip replacement - right  . Dyslipidemia   .  Epistaxis 11/05/2012  . ESRD (end stage renal disease) on dialysis [facility])    "MWF; [facility]" (09/15/2018)- started 02/13/2017  . History of blood transfusion   . HTN (hypertension)   . Hyperparathyroidism due to renal insufficiency ([place])   . Obesity    s/p panniculectomy  . Paroxysmal A-fib (HCC)    a. chronic coumadin;  b. 12/2009 Echo: EF 60-65%, Gr 1 DD.  Marland Kitchen PCOS (polycystic ovarian syndrome)   . Pericarditis 08/2019   pericarditis with pericardial effusion, s/p right VATS/pericardial window 08/31/19  . Pneumonia   . Seasonal allergies   . Sleep apnea    a. not using CPAP, last study  >8 yrs  . Vitamin D  deficiency     Past Surgical History:  Procedure Laterality Date  . ABDOMINAL HYSTERECTOMY     with panniculctomy  . AV FISTULA PLACEMENT  09/02/2012   Procedure: ARTERIOVENOUS (AV) FISTULA CREATION;  Surgeon: Elam Dutch, MD;  Location: [facility] OR;  Service: Vascular;  Laterality: Left;  Creation of Left Radial-Cephalic Fistula   . BREAST SURGERY     Biopsy right breast  . COLONOSCOPY    . COLONOSCOPY W/ BIOPSIES AND POLYPECTOMY    . DILATION AND CURETTAGE OF UTERUS    . KNEE ARTHROSCOPY Right   . PANNICULECTOMY    . REVISON OF ARTERIOVENOUS FISTULA Left 04/27/2014   Procedure: REVISON OF LEFT RADIAL-CEPHALIC ARTERIOVENOUS FISTULA;  Surgeon: Mal Misty, MD;  Location: [place];  Service: Vascular;  Laterality: Left;  . REVISON OF ARTERIOVENOUS FISTULA Left 01/13/2020   Procedure: REVISON OF ARTERIOVENOUS FISTULA;  Surgeon: Marty Heck, MD;  Location: [place];  Service: Vascular;  Laterality: Left;  . TOTAL HIP ARTHROPLASTY Right 09/15/2018  . TOTAL HIP ARTHROPLASTY Right 09/15/2018   Procedure: RIGHT TOTAL HIP ARTHROPLASTY ANTERIOR APPROACH;  Surgeon: Leandrew Koyanagi, MD;  Location: [place];  Service: Orthopedics;  Laterality: Right;  . TOTAL HIP ARTHROPLASTY Left 11/23/2020   Procedure: LEFT TOTAL HIP ARTHROPLASTY ANTERIOR APPROACH;  Surgeon: Leandrew Koyanagi, MD;  Location: [place];  Service: Orthopedics;  Laterality: Left;  NEEDS RNFA PLEASE  . UVULOPLASTY    . VIDEO ASSISTED THORACOSCOPY (VATS)/WEDGE RESECTION Right 08/31/2019   Procedure: VIDEO ASSISTED THORACOSCOPY/ DRAINAGE OF PERICARDIAL EFFUSION/ PERICARDIAL WINDOW/ ABORTED PERICARDIOCENTESIS ;  Surgeon: Lajuana Matte, MD;  Location: [facility] OR;  Service: Thoracic;  Laterality: Right;    Current Medications: Current Meds  Medication Sig  . B Complex-C-Folic Acid (DIALYVITE TABLET) TABS Take 1 tablet by mouth daily.  . bisacodyl (DULCOLAX) 5 MG EC tablet Take 5 mg by mouth daily as needed for moderate constipation.  .  calcitRIOL (ROCALTROL) 0.25 MCG capsule Take 3 capsules (0.75 mcg total) by mouth every Monday, Wednesday, and Friday with hemodialysis.  Marland Kitchen colchicine 0.6 MG tablet TAKE HALF TABLET BY MOUTH EVERY MONDAY, WEDNESDAY, AND FRIDAY WITH HEMODIALYSIS. (Patient taking differently: Take 0.3 mg by mouth every Monday, Wednesday, and Friday. With Dialysis)  . dextrose 50 % solution Dextrose 50%  . diphenhydrAMINE (BENADRYL) 25 MG tablet Take 25 mg by mouth daily as needed for itching.  . docusate sodium (COLACE) 100 MG capsule Take 1 capsule (100 mg total) by mouth daily as needed.  Marland Kitchen ELIQUIS 5 MG TABS tablet Take 5 mg by mouth 2 (two) times daily.   Marland Kitchen ethyl chloride spray Apply 1 application topically daily as needed (port access).   . gabapentin (NEURONTIN) 100 MG capsule Take 100 mg by mouth at bedtime.  Marland Kitchen  Menthol-Methyl Salicylate (MUSCLE RUB) 10-15 % CREA Apply 1 application topically as needed for muscle pain.  . methocarbamol (ROBAXIN) 500 MG tablet Take 1 tablet (500 mg total) by mouth 2 (two) times daily as needed. To be taken after surgery  . Methoxy PEG-Epoetin Beta (MIRCERA IJ) Mircera  . midodrine (PROAMATINE) 10 MG tablet Take 1 tablet (10 mg total) by mouth 3 (three) times daily with meals.  Marland Kitchen oxyCODONE (ROXICODONE) 5 MG immediate release tablet Take 1 tablet (5 mg total) by mouth every 6 (six) hours as needed for severe pain.  . sucroferric oxyhydroxide (VELPHORO) 500 MG chewable tablet Chew 500-1,000 mg by mouth See admin instructions. Take 1000 mg 3 times daily with meals and 500 mg daily with a snack.     Allergies:   Iodine, Other, Shellfish allergy, Amlodipine, and Lisinopril   Social History   Socioeconomic History  . Marital status: Single    Spouse name: Not on file  . Number of children: Not on file  . Years of education: Not on file  . Highest education level: Not on file  Occupational History  . Occupation: Dance movement psychotherapist.    Employer: [employer]  Tobacco  Use  . Smoking status: Never Smoker  . Smokeless tobacco: Never Used  Vaping Use  . Vaping Use: Never used  Substance and Sexual Activity  . Alcohol use: No    Alcohol/week: 0.0 standard drinks  . Drug use: No  . Sexual activity: Not on file    Comment: Hysterectomy  Other Topics Concern  . Not on file  Social History Narrative   Lives in [place] alone. 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   Social Determinants of Health   Financial Resource Strain: Not on file  Food Insecurity: Not on file  Transportation Needs: Not on file  Physical Activity: Not on file  Stress: Not on file  Social Connections: Not on file     Family History: The patient's family history includes Diabetes in her brother; Hypertension in her mother; Kidney disease in her brother; Lung cancer in her father.  ROS:   Please see the history of present illness.    ROS  All other systems reviewed and negative.   EKGs/Labs/Other Studies Reviewed:    The following studies were reviewed today: 2D echo 01/2020 IMPRESSIONS   1. Left ventricular ejection fraction, by estimation, is 70 to 75%. The  left ventricle has hyperdynamic function. Left ventricular diastolic  parameters are indeterminate.  2. Right ventricular systolic function is normal. The right ventricular  size is normal. There is normal pulmonary artery systolic pressure.  3. The mitral valve is normal in structure and function. Trivial mitral  valve regurgitation.  4. The aortic valve is abnormal. Mild to moderate aortic valve  sclerosis/calcification is present, without any evidence of aortic  stenosis.  5. The inferior vena cava is normal in size with greater than 50%  respiratory variability, suggesting right atrial pressure of 3 mmHg  EKG:  EKG is  ordered today and showed NSR with RBBB inferior and anterior infarcts unchanged from prior EKG  Recent Labs: 11/13/2020: TSH 1.730 11/21/2020: ALT 17 11/27/2020:  BUN 75; Creatinine, Ser 11.54; Hemoglobin 8.3; Platelets 244; Potassium 3.9; Sodium 137   Recent Lipid Panel    Component Value Date/Time   CHOL 175 08/27/2019 0353   TRIG 83 08/27/2019 0353   HDL 43 08/27/2019 0353   CHOLHDL 4.1 08/27/2019 0353   VLDL 17 08/27/2019 0353  LDLCALC 115 (H) 08/27/2019 0353    Physical Exam:    VS:  BP 90/60 (BP Location: Right Arm, Patient Position: Sitting, Cuff Size: Normal)   Pulse 100   Ht '5\' 6"'$  (1.676 m)   Wt 234 lb (106.1 kg)   SpO2 98%   BMI 37.77 kg/m     Wt Readings from Last 3 Encounters:  02/21/21 234 lb (106.1 kg)  11/29/20 236 lb 1.8 oz (107.1 kg)  11/21/20 239 lb 14.4 oz (108.8 kg)    GEN: Well nourished, well developed in no acute distress HEENT: Normal NECK: No JVD; No carotid bruits LYMPHATICS: No lymphadenopathy CARDIAC:RRR, no murmurs, rubs, gallops RESPIRATORY:  Clear to auscultation without rales, wheezing or rhonchi  ABDOMEN: Soft, non-tender, non-distended MUSCULOSKELETAL:  No edema; No deformity  SKIN: Warm and dry NEUROLOGIC:  Alert and oriented x 3 PSYCHIATRIC:  Normal affect    ASSESSMENT:    1. Pericarditis, unspecified chronicity, unspecified type   2. Paroxysmal atrial fibrillation (HCC)   3. End stage renal disease ([place])   4. DM type 2, goal HbA1c < 7% (HCC)   5. Hyperlipidemia, unspecified hyperlipidemia type    PLAN:    In order of problems listed above:  1 . Acute pericarditis -s/p pericardial tamonade with pericardial window -Cultures NGTD, cytology with no malignant cells -continue colchicine -repeat 2D 01/2020 with no pericardial effusion -she has not had any further CP  2.  PAF -she had recurrent PAF and started back on Amio '200mg'$  daily as it was felt to be only option given that she has ESRD but ran out of it at last OV and did not want to go back on it -she is maintaining NSR on exam -continue Eliquis '5mg'$  BID for stroke risk reduction -denies any bleeding problems on DOAC -Hbg was  8.3 in Dec 2020 likely related to chronic disease from ESRD -she is having fluttering in her chest but does not think it is atrial fibrillation -I will get a 2 week ziopatch to assess  3.  ESRD -on HD -has some problems with Hypotension at HD which is stable on Midodrine  4.  Type 2 DM -followed by PCP -HbA1C 4.7% in Dec 2021  5.  HLD -LDL goal < 70 -LDL was 115 in 2020 -patient has refused statin therapy  Medication Adjustments/Labs and Tests Ordered: Current medicines are reviewed at length with the patient today.  Concerns regarding medicines are outlined above.  No orders of the defined types were placed in this encounter.  No orders of the defined types were placed in this encounter.   Signed, Fransico Him, MD  02/21/2021 10:53 AM    [place]

## 2021-02-21 NOTE — Addendum Note (Signed)
Addended by: Antonieta Iba on: 02/21/2021 10:59 AM   Modules accepted: Orders

## 2021-03-01 ENCOUNTER — Ambulatory Visit (INDEPENDENT_AMBULATORY_CARE_PROVIDER_SITE_OTHER): Payer: Medicare Other

## 2021-03-01 ENCOUNTER — Encounter: Payer: Self-pay | Admitting: Orthopaedic Surgery

## 2021-03-01 ENCOUNTER — Ambulatory Visit (INDEPENDENT_AMBULATORY_CARE_PROVIDER_SITE_OTHER): Payer: Medicare Other | Admitting: Orthopaedic Surgery

## 2021-03-01 DIAGNOSIS — Z96642 Presence of left artificial hip joint: Secondary | ICD-10-CM | POA: Diagnosis not present

## 2021-03-01 NOTE — Progress Notes (Signed)
Post-Op Visit Note   Patient: Brenda Arnold           Date of Birth: 11/10/1956           MRN: [id number] Visit Date: 03/01/2021 PCP: Kelton Pillar, MD   Assessment & Plan:  Chief Complaint:  Chief Complaint  Patient presents with  . Left Hip - Routine Post Op, Pain   Visit Diagnoses:  1. Status post total replacement of left hip     Plan: Patient is a pleasant 65 year old female who comes in today 3 months out left total hip replacement.  Doing well.  She notes an occasional hot poker sensation to the lateral hip but this only happens once every few weeks.  She has back to baseline ambulating with a walker.  Examination of her left hip reveals painless range of motion.  She is neurovascular intact distally.  At this point, she will continue to advance with activity as tolerated.  Follow-up with [place] in 3 months time for recheck.  Dental prophylaxis reinforced.  Follow-Up Instructions: Return in about 3 months (around 05/31/2021).   Orders:  Orders Placed This Encounter  Procedures  . XR HIP UNILAT W OR W/O PELVIS 2-3 VIEWS LEFT   No orders of the defined types were placed in this encounter.   Imaging: XR HIP UNILAT W OR W/O PELVIS 2-3 VIEWS LEFT  Result Date: 03/01/2021 Well-seated prosthesis without complication   PMFS History: Patient Active Problem List   Diagnosis Date Noted  . Acquired thrombophilia ([place]) 01/22/2021  . Asthma without status asthmaticus 01/22/2021  . Chronic diastolic heart failure ([place]) 01/22/2021  . Dependence on renal dialysis ([place]) 01/22/2021  . Diabetic neuropathy ([place]) 01/22/2021  . Diabetic renal disease ([place]) 01/22/2021  . Diabetic retinopathy associated with type 2 diabetes mellitus ([place]) 01/22/2021  . Hardening of the aorta (main artery of the heart) ([place]) 01/22/2021  . Hearing loss 01/22/2021  . Hypertensive heart and chronic kidney disease with heart failure and with stage 5 chronic kidney disease, or end stage renal disease  ([place]) 01/22/2021  . Pure hypercholesterolemia 01/22/2021  . COVID-19 12/07/2020  . Encounter for screening for COVID-19 12/07/2020  . S/P total left hip arthroplasty 11/25/2020  . Primary osteoarthritis of left hip 11/23/2020  . Status post total replacement of left hip 11/23/2020  . Diarrhea, unspecified 09/06/2020  . Headache, unspecified 07/11/2020  . Subacute osteomyelitis of right hand ([place]) 01/27/2020  . Finger infection 01/26/2020  . ESRD (end stage renal disease) ([place]) 01/13/2020  . Allergy, unspecified, initial encounter 09/20/2019  . ESRD on dialysis ([place])   . Physical debility 09/09/2019  . Pericardial effusion   . SOB (shortness of breath)   . Leukocytosis   . Anemia of chronic disease   . PAF (paroxysmal atrial fibrillation) ([place])   . Postoperative pain   . Cardiac tamponade   . Chest pain at rest 08/26/2019  . Tachycardia 08/26/2019  . Pericarditis 08/26/2019  . Angina pectoris, unspecified ([place]) 08/25/2019  . CAD (coronary artery disease) 11/05/2018  . A-fib ([place]) 11/05/2018  . Atrial fibrillation with RVR ([place])   . Hypotension 11/04/2018  . ESRD on hemodialysis ([place]) 11/04/2018  . DVT (deep venous thrombosis) ([place]) 11/03/2018  . Hyperkalemia 10/02/2018  . Status post total replacement of right hip 09/15/2018  . Encounter for removal of sutures 05/19/2018  . Varicose veins of bilateral lower extremities with other complications 0000000  . Hypercalcemia 05/17/2017  . Secondary hyperparathyroidism of renal origin ([place]) 02/21/2017  .  Pruritus, unspecified 02/13/2017  . Pain, unspecified 02/13/2017  . Other specified coagulation defects ([place]) 02/13/2017  . Iron deficiency anemia, unspecified 02/13/2017  . Fever, unspecified 02/13/2017  . Anticoagulation goal of INR 2 to 3 09/30/2014  . Pain in lower limb 05/02/2014  . Onychomycosis due to dermatophyte 04/06/2013  . Pain in joint, ankle and foot 04/06/2013  . Bradycardia 11/06/2012  . Left-sided epistaxis  11/05/2012  . DM (diabetes mellitus) ([place]) 11/05/2012  . OSA (obstructive sleep apnea) 11/05/2012  . Acute posterior epistaxis 11/05/2012  . CKD (chronic kidney disease) 11/05/2012  . End stage renal disease ([place]) 06/25/2012  . Type 2 diabetes mellitus with complication, without long-term current use of insulin ([place]) 12/20/2009  . OBESITY 12/20/2009  . OBESITY-MORBID (>100') 12/20/2009  . Essential hypertension 12/20/2009  . Paroxysmal atrial fibrillation ([place]) 12/20/2009  . Chest pain 12/20/2009  . ABNORMAL CV (STRESS) TEST 12/20/2009   Past Medical History:  Diagnosis Date  . Anemia   . Arthritis   . Asthma   . Complication of anesthesia    difficulty with getting oxygen saturation up- "patient was not aware"  Bottom of feet burning in PACU  . Constipation    because of Iron  . Diabetes mellitus    not on meds  . DVT (deep venous thrombosis) ([place]) 2019   post hip replacement - right  . Dyslipidemia   . Epistaxis 11/05/2012  . ESRD (end stage renal disease) on dialysis [facility])    "MWF; Jeneen Rinks" (09/15/2018)- started 02/13/2017  . History of blood transfusion   . HTN (hypertension)   . Hyperparathyroidism due to renal insufficiency ([place])   . Obesity    s/p panniculectomy  . Paroxysmal A-fib ([facility])    a. chronic coumadin;  b. 12/2009 Echo: EF 60-65%, Gr 1 DD.  Marland Kitchen PCOS (polycystic ovarian syndrome)   . Pericarditis 08/2019   pericarditis with pericardial effusion, s/p right VATS/pericardial window 08/31/19  . Pneumonia   . Seasonal allergies   . Sleep apnea    a. not using CPAP, last study  >8 yrs  . Vitamin D deficiency     Family History  Problem Relation Age of Onset  . Lung cancer Father        died @ 59  . Hypertension Mother   . Diabetes Brother   . Kidney disease Brother     Past Surgical History:  Procedure Laterality Date  . ABDOMINAL HYSTERECTOMY     with panniculctomy  . AV FISTULA PLACEMENT  09/02/2012   Procedure: ARTERIOVENOUS (AV) FISTULA  CREATION;  Surgeon: Elam Dutch, MD;  Location: [facility] OR;  Service: Vascular;  Laterality: Left;  Creation of Left Radial-Cephalic Fistula   . BREAST SURGERY     Biopsy right breast  . COLONOSCOPY    . COLONOSCOPY W/ BIOPSIES AND POLYPECTOMY    . DILATION AND CURETTAGE OF UTERUS    . KNEE ARTHROSCOPY Right   . PANNICULECTOMY    . REVISON OF ARTERIOVENOUS FISTULA Left 04/27/2014   Procedure: REVISON OF LEFT RADIAL-CEPHALIC ARTERIOVENOUS FISTULA;  Surgeon: Mal Misty, MD;  Location: [place];  Service: Vascular;  Laterality: Left;  . REVISON OF ARTERIOVENOUS FISTULA Left 01/13/2020   Procedure: REVISON OF ARTERIOVENOUS FISTULA;  Surgeon: Marty Heck, MD;  Location: [place];  Service: Vascular;  Laterality: Left;  . TOTAL HIP ARTHROPLASTY Right 09/15/2018  . TOTAL HIP ARTHROPLASTY Right 09/15/2018   Procedure: RIGHT TOTAL HIP ARTHROPLASTY ANTERIOR APPROACH;  Surgeon: Leandrew Koyanagi,  MD;  Location: [place];  Service: Orthopedics;  Laterality: Right;  . TOTAL HIP ARTHROPLASTY Left 11/23/2020   Procedure: LEFT TOTAL HIP ARTHROPLASTY ANTERIOR APPROACH;  Surgeon: Leandrew Koyanagi, MD;  Location: [place];  Service: Orthopedics;  Laterality: Left;  NEEDS RNFA PLEASE  . UVULOPLASTY    . VIDEO ASSISTED THORACOSCOPY (VATS)/WEDGE RESECTION Right 08/31/2019   Procedure: VIDEO ASSISTED THORACOSCOPY/ DRAINAGE OF PERICARDIAL EFFUSION/ PERICARDIAL WINDOW/ ABORTED PERICARDIOCENTESIS ;  Surgeon: Lajuana Matte, MD;  Location: [facility] OR;  Service: Thoracic;  Laterality: Right;   Social History   Occupational History  . Occupation: Dance movement psychotherapist.    Employer: [employer]  Tobacco Use  . Smoking status: Never Smoker  . Smokeless tobacco: Never Used  Vaping Use  . Vaping Use: Never used  Substance and Sexual Activity  . Alcohol use: No    Alcohol/week: 0.0 standard drinks  . Drug use: No  . Sexual activity: Not on file    Comment: Hysterectomy

## 2021-03-02 ENCOUNTER — Other Ambulatory Visit: Payer: Self-pay

## 2021-03-02 ENCOUNTER — Ambulatory Visit (INDEPENDENT_AMBULATORY_CARE_PROVIDER_SITE_OTHER): Payer: Medicare Other | Admitting: Podiatry

## 2021-03-02 DIAGNOSIS — M2042 Other hammer toe(s) (acquired), left foot: Secondary | ICD-10-CM

## 2021-03-02 DIAGNOSIS — E0822 Diabetes mellitus due to underlying condition with diabetic chronic kidney disease: Secondary | ICD-10-CM

## 2021-03-02 DIAGNOSIS — Z992 Dependence on renal dialysis: Secondary | ICD-10-CM

## 2021-03-02 DIAGNOSIS — M2041 Other hammer toe(s) (acquired), right foot: Secondary | ICD-10-CM | POA: Diagnosis not present

## 2021-03-02 DIAGNOSIS — E119 Type 2 diabetes mellitus without complications: Secondary | ICD-10-CM | POA: Diagnosis not present

## 2021-03-02 DIAGNOSIS — L84 Corns and callosities: Secondary | ICD-10-CM | POA: Diagnosis not present

## 2021-03-02 DIAGNOSIS — N186 End stage renal disease: Secondary | ICD-10-CM

## 2021-03-05 DIAGNOSIS — D631 Anemia in chronic kidney disease: Secondary | ICD-10-CM | POA: Diagnosis not present

## 2021-03-05 DIAGNOSIS — Z992 Dependence on renal dialysis: Secondary | ICD-10-CM | POA: Diagnosis not present

## 2021-03-05 DIAGNOSIS — N186 End stage renal disease: Secondary | ICD-10-CM | POA: Diagnosis not present

## 2021-03-05 DIAGNOSIS — Z23 Encounter for immunization: Secondary | ICD-10-CM | POA: Diagnosis not present

## 2021-03-05 DIAGNOSIS — N2581 Secondary hyperparathyroidism of renal origin: Secondary | ICD-10-CM | POA: Diagnosis not present

## 2021-03-06 NOTE — Progress Notes (Signed)
The patient presented on 03-02-2021 to the office to pick up diabetic shoes and diabetic custom inserts.  1 pair of inserts were put in the shoes and the shoes were fitted to the patient. The patient states they are comfortable and free of defect. She was satisfied with the fit of the shoe. Instructions for break in and wear were dispensed. The patient signed the delivery documentation and break in instruction form.  If any concerns or questions arise, she is instructed to call.

## 2021-03-16 ENCOUNTER — Other Ambulatory Visit: Payer: Self-pay

## 2021-03-16 ENCOUNTER — Emergency Department (HOSPITAL_COMMUNITY): Payer: Medicare Other

## 2021-03-16 ENCOUNTER — Inpatient Hospital Stay (HOSPITAL_COMMUNITY)
Admission: EM | Admit: 2021-03-16 | Discharge: 2021-03-18 | DRG: 308 | Disposition: A | Discharge status: Home / Self Care | Payer: Medicare Other | Attending: Internal Medicine | Admitting: Internal Medicine

## 2021-03-16 ENCOUNTER — Ambulatory Visit (HOSPITAL_COMMUNITY)
Admission: EM | Admit: 2021-03-16 | Discharge: 2021-03-16 | Disposition: A | Discharge status: Home / Self Care | Payer: Medicare Other | Attending: Urgent Care | Admitting: Urgent Care

## 2021-03-16 DIAGNOSIS — E7800 Pure hypercholesterolemia, unspecified: Secondary | ICD-10-CM | POA: Diagnosis present

## 2021-03-16 DIAGNOSIS — I132 Hypertensive heart and chronic kidney disease with heart failure and with stage 5 chronic kidney disease, or end stage renal disease: Secondary | ICD-10-CM

## 2021-03-16 DIAGNOSIS — I2510 Atherosclerotic heart disease of native coronary artery without angina pectoris: Secondary | ICD-10-CM | POA: Diagnosis present

## 2021-03-16 DIAGNOSIS — N186 End stage renal disease: Secondary | ICD-10-CM | POA: Diagnosis present

## 2021-03-16 DIAGNOSIS — E1142 Type 2 diabetes mellitus with diabetic polyneuropathy: Secondary | ICD-10-CM | POA: Diagnosis present

## 2021-03-16 DIAGNOSIS — I4811 Longstanding persistent atrial fibrillation: Secondary | ICD-10-CM | POA: Diagnosis not present

## 2021-03-16 DIAGNOSIS — Z841 Family history of disorders of kidney and ureter: Secondary | ICD-10-CM

## 2021-03-16 DIAGNOSIS — R7989 Other specified abnormal findings of blood chemistry: Secondary | ICD-10-CM | POA: Diagnosis present

## 2021-03-16 DIAGNOSIS — Z992 Dependence on renal dialysis: Secondary | ICD-10-CM

## 2021-03-16 DIAGNOSIS — E11319 Type 2 diabetes mellitus with unspecified diabetic retinopathy without macular edema: Secondary | ICD-10-CM | POA: Diagnosis present

## 2021-03-16 DIAGNOSIS — I200 Unstable angina: Secondary | ICD-10-CM | POA: Diagnosis not present

## 2021-03-16 DIAGNOSIS — I4891 Unspecified atrial fibrillation: Secondary | ICD-10-CM | POA: Diagnosis not present

## 2021-03-16 DIAGNOSIS — Z96643 Presence of artificial hip joint, bilateral: Secondary | ICD-10-CM | POA: Diagnosis present

## 2021-03-16 DIAGNOSIS — I4510 Unspecified right bundle-branch block: Secondary | ICD-10-CM | POA: Diagnosis present

## 2021-03-16 DIAGNOSIS — E282 Polycystic ovarian syndrome: Secondary | ICD-10-CM | POA: Diagnosis present

## 2021-03-16 DIAGNOSIS — I959 Hypotension, unspecified: Secondary | ICD-10-CM | POA: Diagnosis not present

## 2021-03-16 DIAGNOSIS — R079 Chest pain, unspecified: Secondary | ICD-10-CM | POA: Diagnosis not present

## 2021-03-16 DIAGNOSIS — Z8249 Family history of ischemic heart disease and other diseases of the circulatory system: Secondary | ICD-10-CM

## 2021-03-16 DIAGNOSIS — I953 Hypotension of hemodialysis: Secondary | ICD-10-CM | POA: Diagnosis present

## 2021-03-16 DIAGNOSIS — Z6837 Body mass index (BMI) 37.0-37.9, adult: Secondary | ICD-10-CM

## 2021-03-16 DIAGNOSIS — I9589 Other hypotension: Secondary | ICD-10-CM | POA: Diagnosis present

## 2021-03-16 DIAGNOSIS — E1122 Type 2 diabetes mellitus with diabetic chronic kidney disease: Secondary | ICD-10-CM | POA: Diagnosis present

## 2021-03-16 DIAGNOSIS — Z801 Family history of malignant neoplasm of trachea, bronchus and lung: Secondary | ICD-10-CM

## 2021-03-16 DIAGNOSIS — D631 Anemia in chronic kidney disease: Secondary | ICD-10-CM | POA: Diagnosis present

## 2021-03-16 DIAGNOSIS — Z7901 Long term (current) use of anticoagulants: Secondary | ICD-10-CM

## 2021-03-16 DIAGNOSIS — R778 Other specified abnormalities of plasma proteins: Secondary | ICD-10-CM | POA: Diagnosis not present

## 2021-03-16 DIAGNOSIS — I120 Hypertensive chronic kidney disease with stage 5 chronic kidney disease or end stage renal disease: Secondary | ICD-10-CM | POA: Diagnosis present

## 2021-03-16 DIAGNOSIS — Z86718 Personal history of other venous thrombosis and embolism: Secondary | ICD-10-CM

## 2021-03-16 DIAGNOSIS — Z888 Allergy status to other drugs, medicaments and biological substances status: Secondary | ICD-10-CM

## 2021-03-16 DIAGNOSIS — Z8679 Personal history of other diseases of the circulatory system: Secondary | ICD-10-CM

## 2021-03-16 DIAGNOSIS — N2581 Secondary hyperparathyroidism of renal origin: Secondary | ICD-10-CM | POA: Diagnosis present

## 2021-03-16 DIAGNOSIS — E785 Hyperlipidemia, unspecified: Secondary | ICD-10-CM | POA: Diagnosis present

## 2021-03-16 DIAGNOSIS — Z20822 Contact with and (suspected) exposure to covid-19: Secondary | ICD-10-CM | POA: Diagnosis present

## 2021-03-16 DIAGNOSIS — I472 Ventricular tachycardia: Secondary | ICD-10-CM | POA: Diagnosis present

## 2021-03-16 DIAGNOSIS — E669 Obesity, unspecified: Secondary | ICD-10-CM | POA: Diagnosis present

## 2021-03-16 DIAGNOSIS — I25119 Atherosclerotic heart disease of native coronary artery with unspecified angina pectoris: Secondary | ICD-10-CM

## 2021-03-16 DIAGNOSIS — Z79899 Other long term (current) drug therapy: Secondary | ICD-10-CM

## 2021-03-16 DIAGNOSIS — Z833 Family history of diabetes mellitus: Secondary | ICD-10-CM

## 2021-03-16 DIAGNOSIS — E1140 Type 2 diabetes mellitus with diabetic neuropathy, unspecified: Secondary | ICD-10-CM | POA: Diagnosis present

## 2021-03-16 DIAGNOSIS — Z8616 Personal history of COVID-19: Secondary | ICD-10-CM

## 2021-03-16 DIAGNOSIS — I248 Other forms of acute ischemic heart disease: Secondary | ICD-10-CM | POA: Diagnosis present

## 2021-03-16 DIAGNOSIS — Z90710 Acquired absence of both cervix and uterus: Secondary | ICD-10-CM

## 2021-03-16 DIAGNOSIS — K30 Functional dyspepsia: Secondary | ICD-10-CM | POA: Diagnosis present

## 2021-03-16 DIAGNOSIS — G4733 Obstructive sleep apnea (adult) (pediatric): Secondary | ICD-10-CM | POA: Diagnosis present

## 2021-03-16 LAB — TROPONIN I (HIGH SENSITIVITY)
Troponin I (High Sensitivity): 10 ng/L (ref <18)
Troponin I (High Sensitivity): 51 ng/L — ABNORMAL HIGH (ref <18)
Troponin I (High Sensitivity): 294 ng/L (ref <18)
Troponin I (High Sensitivity): 399 ng/L (ref <18)

## 2021-03-16 LAB — CBC
HCT: 33.6 % — ABNORMAL LOW (ref 36.0–46.0)
Hemoglobin: 10.6 g/dL — ABNORMAL LOW (ref 12.0–15.0)
MCH: 30.6 pg (ref 26.0–34.0)
MCHC: 31.5 g/dL (ref 30.0–36.0)
MCV: 97.1 fL (ref 80.0–100.0)
Platelets: 121 10*3/uL — ABNORMAL LOW (ref 150–400)
RBC: 3.46 MIL/uL — ABNORMAL LOW (ref 3.87–5.11)
RDW: 15.4 % (ref 11.5–15.5)
WBC: 8.4 10*3/uL (ref 4.0–10.5)
nRBC: 0 % (ref 0.0–0.2)

## 2021-03-16 LAB — COMPREHENSIVE METABOLIC PANEL
ALT: 26 U/L (ref 0–44)
AST: 31 U/L (ref 15–41)
Albumin: 3.6 g/dL (ref 3.5–5.0)
Alkaline Phosphatase: 260 U/L — ABNORMAL HIGH (ref 38–126)
Anion gap: 17 — ABNORMAL HIGH (ref 5–15)
BUN: 16 mg/dL (ref 8–23)
CO2: 25 mmol/L (ref 22–32)
Calcium: 9.8 mg/dL (ref 8.9–10.3)
Chloride: 96 mmol/L — ABNORMAL LOW (ref 98–111)
Creatinine, Ser: 5.07 mg/dL — ABNORMAL HIGH (ref 0.44–1.00)
GFR, Estimated: 9 mL/min — ABNORMAL LOW (ref >60)
Glucose, Bld: 195 mg/dL — ABNORMAL HIGH (ref 70–99)
Potassium: 3.4 mmol/L — ABNORMAL LOW (ref 3.5–5.1)
Sodium: 138 mmol/L (ref 135–145)
Total Bilirubin: 0.7 mg/dL (ref 0.3–1.2)
Total Protein: 6.8 g/dL (ref 6.5–8.1)

## 2021-03-16 LAB — ECHOCARDIOGRAM COMPLETE
AR max vel: 1.92 cm2
AV Area VTI: 2.12 cm2
AV Area mean vel: 2.06 cm2
AV Mean grad: 4 mmHg
AV Peak grad: 6.8 mmHg
Ao pk vel: 1.3 m/s
S' Lateral: 1.9 cm

## 2021-03-16 LAB — D-DIMER, QUANTITATIVE: D-Dimer, Quant: 1.13 ug/mL-FEU — ABNORMAL HIGH (ref 0.00–0.50)

## 2021-03-16 LAB — HIV ANTIBODY (ROUTINE TESTING W REFLEX): HIV Screen 4th Generation wRfx: NONREACTIVE

## 2021-03-16 LAB — CBG MONITORING, ED: Glucose-Capillary: 164 mg/dL — ABNORMAL HIGH (ref 70–99)

## 2021-03-16 LAB — TSH: TSH: 1.471 u[IU]/mL (ref 0.350–4.500)

## 2021-03-16 MED ORDER — ONDANSETRON HCL 4 MG/2ML IJ SOLN: 4.0000 mg | Freq: Four times a day (QID) | INTRAMUSCULAR | Status: DC | PRN

## 2021-03-16 MED ORDER — BISACODYL 5 MG PO TBEC: 5.0000 mg | DELAYED_RELEASE_TABLET | Freq: Every day | ORAL | Status: DC | PRN

## 2021-03-16 MED ORDER — AMIODARONE LOAD VIA INFUSION
150.0000 mg | Freq: Once | INTRAVENOUS | Status: AC
  Administered 2021-03-16: 150 mg via INTRAVENOUS
  Filled 2021-03-16: qty 83.34

## 2021-03-16 MED ORDER — SUCROFERRIC OXYHYDROXIDE 500 MG PO CHEW
1000.0000 mg | CHEWABLE_TABLET | Freq: Three times a day (TID) | ORAL | Status: DC
  Administered 2021-03-17 – 2021-03-18 (×4): 1000 mg via ORAL
  Filled 2021-03-16 (×5): qty 2

## 2021-03-16 MED ORDER — DOCUSATE SODIUM 100 MG PO CAPS: 100.0000 mg | ORAL_CAPSULE | Freq: Every day | ORAL | Status: DC | PRN

## 2021-03-16 MED ORDER — COLCHICINE 0.3 MG HALF TABLET
0.3000 mg | ORAL_TABLET | ORAL | Status: DC
  Filled 2021-03-16: qty 1

## 2021-03-16 MED ORDER — AMIODARONE HCL IN DEXTROSE 360-4.14 MG/200ML-% IV SOLN
60.0000 mg/h | INTRAVENOUS | Status: AC
  Administered 2021-03-16 (×2): 60 mg/h via INTRAVENOUS
  Filled 2021-03-16: qty 200

## 2021-03-16 MED ORDER — APIXABAN 5 MG PO TABS
5.0000 mg | ORAL_TABLET | Freq: Two times a day (BID) | ORAL | Status: DC
  Administered 2021-03-16 – 2021-03-18 (×4): 5 mg via ORAL
  Filled 2021-03-16 (×4): qty 1

## 2021-03-16 MED ORDER — AMIODARONE HCL IN DEXTROSE 360-4.14 MG/200ML-% IV SOLN
30.0000 mg/h | INTRAVENOUS | Status: DC
  Administered 2021-03-16 – 2021-03-17 (×2): 30 mg/h via INTRAVENOUS
  Filled 2021-03-16 (×2): qty 200

## 2021-03-16 MED ORDER — OXYCODONE HCL 5 MG PO TABS: 5.0000 mg | ORAL_TABLET | Freq: Four times a day (QID) | ORAL | Status: DC | PRN

## 2021-03-16 MED ORDER — DIPHENHYDRAMINE HCL 25 MG PO CAPS
25.0000 mg | ORAL_CAPSULE | Freq: Every day | ORAL | Status: DC | PRN
  Filled 2021-03-16: qty 1

## 2021-03-16 MED ORDER — CALCITRIOL 0.5 MCG PO CAPS: 0.7500 ug | ORAL_CAPSULE | ORAL | Status: DC

## 2021-03-16 MED ORDER — MIDODRINE HCL 5 MG PO TABS
10.0000 mg | ORAL_TABLET | Freq: Three times a day (TID) | ORAL | Status: DC
  Administered 2021-03-16 – 2021-03-18 (×5): 10 mg via ORAL
  Filled 2021-03-16 (×5): qty 2

## 2021-03-16 MED ORDER — ONDANSETRON HCL 4 MG PO TABS: 4.0000 mg | ORAL_TABLET | Freq: Four times a day (QID) | ORAL | Status: DC | PRN

## 2021-03-16 MED ORDER — SODIUM CHLORIDE 0.9 % IV BOLUS
250.0000 mL | Freq: Once | INTRAVENOUS | Status: AC
  Administered 2021-03-16: 250 mL via INTRAVENOUS

## 2021-03-16 MED ORDER — MIDAZOLAM HCL 2 MG/2ML IJ SOLN
2.0000 mg | Freq: Once | INTRAMUSCULAR | Status: AC
  Administered 2021-03-16: 2 mg via INTRAVENOUS
  Filled 2021-03-16: qty 2

## 2021-03-16 MED ORDER — METHOCARBAMOL 500 MG PO TABS: 500.0000 mg | ORAL_TABLET | Freq: Three times a day (TID) | ORAL | Status: DC | PRN

## 2021-03-16 MED ORDER — GABAPENTIN 100 MG PO CAPS
100.0000 mg | ORAL_CAPSULE | Freq: Every day | ORAL | Status: DC
  Administered 2021-03-16 – 2021-03-17 (×2): 100 mg via ORAL
  Filled 2021-03-16 (×2): qty 1

## 2021-03-16 MED ORDER — HYDROCODONE-ACETAMINOPHEN 5-325 MG PO TABS
ORAL_TABLET | ORAL | Status: AC
  Filled 2021-03-16: qty 1

## 2021-03-16 MED ORDER — HYDROCODONE-ACETAMINOPHEN 5-325 MG PO TABS
1.0000 | ORAL_TABLET | Freq: Once | ORAL | Status: AC
  Administered 2021-03-16: 1 via ORAL

## 2021-03-16 NOTE — ED Provider Notes (Signed)
[place]   MRN: [id number] DOB: January 27, 1956  Subjective:   Brenda Arnold is a 65 y.o. female with past medical history of ESRD on dialysis, paroxysmal atrial fibrillation, uncontrolled diabetes, pericarditis presenting for acute onset this morning of midsternal chest pain rated 7 out of 10 with associated shortness of breath.  Has taken her regular medications this morning but nothing for her pain.  No current facility-administered medications for this encounter.  Current Outpatient Medications:  .  B Complex-C-Folic Acid (DIALYVITE TABLET) TABS, Take 1 tablet by mouth daily., Disp: , Rfl:  .  bisacodyl (DULCOLAX) 5 MG EC tablet, Take 5 mg by mouth daily as needed for moderate constipation., Disp: , Rfl:  .  calcitRIOL (ROCALTROL) 0.25 MCG capsule, Take 3 capsules (0.75 mcg total) by mouth every Monday, Wednesday, and Friday with hemodialysis., Disp:  , Rfl:  .  colchicine 0.6 MG tablet, TAKE HALF TABLET BY MOUTH EVERY MONDAY, WEDNESDAY, AND FRIDAY WITH HEMODIALYSIS. (Patient taking differently: Take 0.3 mg by mouth every Monday, Wednesday, and Friday. With Dialysis), Disp: 45 tablet, Rfl: 1 .  dextrose 50 % solution, Dextrose 50%, Disp: , Rfl:  .  diphenhydrAMINE (BENADRYL) 25 MG tablet, Take 25 mg by mouth daily as needed for itching., Disp: , Rfl:  .  docusate sodium (COLACE) 100 MG capsule, Take 1 capsule (100 mg total) by mouth daily as needed., Disp: 30 capsule, Rfl: 2 .  ELIQUIS 5 MG TABS tablet, Take 5 mg by mouth 2 (two) times daily. , Disp: , Rfl:  .  ethyl chloride spray, Apply 1 application topically daily as needed (port access). , Disp: , Rfl:  .  gabapentin (NEURONTIN) 100 MG capsule, Take 100 mg by mouth at bedtime., Disp: , Rfl: 3 .  Menthol-Methyl Salicylate (MUSCLE RUB) 10-15 % CREA, Apply 1 application topically as needed for muscle pain., Disp: , Rfl: 0 .  methocarbamol (ROBAXIN) 500 MG tablet, Take 1 tablet (500 mg total) by mouth 2 (two) times  daily as needed. To be taken after surgery, Disp: 20 tablet, Rfl: 0 .  Methoxy PEG-Epoetin Beta (MIRCERA IJ), Mircera, Disp: , Rfl:  .  midodrine (PROAMATINE) 10 MG tablet, Take 1 tablet (10 mg total) by mouth 3 (three) times daily with meals., Disp:  , Rfl:  .  oxyCODONE (ROXICODONE) 5 MG immediate release tablet, Take 1 tablet (5 mg total) by mouth every 6 (six) hours as needed for severe pain., Disp: 30 tablet, Rfl: 0 .  sucroferric oxyhydroxide (VELPHORO) 500 MG chewable tablet, Chew 500-1,000 mg by mouth See admin instructions. Take 1000 mg 3 times daily with meals and 500 mg daily with a snack., Disp: , Rfl:    Allergies  Allergen Reactions  . Iodine Shortness Of Breath  . Other Shortness Of Breath and Anaphylaxis  . Shellfish Allergy Shortness Of Breath  . Amlodipine Swelling    Other reaction(s): edema  . Lisinopril     Other reaction(s): cough    Past Medical History:  Diagnosis Date  . Anemia   . Arthritis   . Asthma   . Complication of anesthesia    difficulty with getting oxygen saturation up- "patient was not aware"  Bottom of feet burning in PACU  . Constipation    because of Iron  . Diabetes mellitus    not on meds  . DVT (deep venous thrombosis) ([place]) 2019   post hip replacement - right  . Dyslipidemia   . Epistaxis 11/05/2012  .  ESRD (end stage renal disease) on dialysis [facility])    "MWF; Jeneen Rinks" (09/15/2018)- started 02/13/2017  . History of blood transfusion   . HTN (hypertension)   . Hyperparathyroidism due to renal insufficiency ([place])   . Obesity    s/p panniculectomy  . Paroxysmal A-fib ([facility])    a. chronic coumadin;  b. 12/2009 Echo: EF 60-65%, Gr 1 DD.  Marland Kitchen PCOS (polycystic ovarian syndrome)   . Pericarditis 08/2019   pericarditis with pericardial effusion, s/p right VATS/pericardial window 08/31/19  . Pneumonia   . Seasonal allergies   . Sleep apnea    a. not using CPAP, last study  >8 yrs  . Vitamin D deficiency      Past Surgical History:   Procedure Laterality Date  . ABDOMINAL HYSTERECTOMY     with panniculctomy  . AV FISTULA PLACEMENT  09/02/2012   Procedure: ARTERIOVENOUS (AV) FISTULA CREATION;  Surgeon: Elam Dutch, MD;  Location[facility] OR;  Service: Vascular;  Laterality: Left;  Creation of Left Radial-Cephalic Fistula   . BREAST SURGERY     Biopsy right breast  . COLONOSCOPY    . COLONOSCOPY W/ BIOPSIES AND POLYPECTOMY    . DILATION AND CURETTAGE OF UTERUS    . KNEE ARTHROSCOPY Right   . PANNICULECTOMY    . REVISON OF ARTERIOVENOUS FISTULA Left 04/27/2014   Procedure: REVISON OF LEFT RADIAL-CEPHALIC ARTERIOVENOUS FISTULA;  Surgeon: Mal Misty, MD;  Location: [place];  Service: Vascular;  Laterality: Left;  . REVISON OF ARTERIOVENOUS FISTULA Left 01/13/2020   Procedure: REVISON OF ARTERIOVENOUS FISTULA;  Surgeon: Marty Heck, MD;  Location: [place];  Service: Vascular;  Laterality: Left;  . TOTAL HIP ARTHROPLASTY Right 09/15/2018  . TOTAL HIP ARTHROPLASTY Right 09/15/2018   Procedure: RIGHT TOTAL HIP ARTHROPLASTY ANTERIOR APPROACH;  Surgeon: Leandrew Koyanagi, MD;  Location: [place];  Service: Orthopedics;  Laterality: Right;  . TOTAL HIP ARTHROPLASTY Left 11/23/2020   Procedure: LEFT TOTAL HIP ARTHROPLASTY ANTERIOR APPROACH;  Surgeon: Leandrew Koyanagi, MD;  Location: [place];  Service: Orthopedics;  Laterality: Left;  NEEDS RNFA PLEASE  . UVULOPLASTY    . VIDEO ASSISTED THORACOSCOPY (VATS)/WEDGE RESECTION Right 08/31/2019   Procedure: VIDEO ASSISTED THORACOSCOPY/ DRAINAGE OF PERICARDIAL EFFUSION/ PERICARDIAL WINDOW/ ABORTED PERICARDIOCENTESIS ;  Surgeon: Lajuana Matte, MD;  Location: [facility] OR;  Service: Thoracic;  Laterality: Right;    Family History  Problem Relation Age of Onset  . Lung cancer Father        died @ 69  . Hypertension Mother   . Diabetes Brother   . Kidney disease Brother     Social History   Tobacco Use  . Smoking status: Never Smoker  . Smokeless tobacco: Never Used  Vaping Use  .  Vaping Use: Never used  Substance Use Topics  . Alcohol use: No    Alcohol/week: 0.0 standard drinks  . Drug use: No    ROS   Objective:   Vitals: BP (!) 126/106 (BP Location: Right Arm)   Pulse oximetry 97 to 100%. Pulse ranged between 88 to 153 bpm.  Physical Exam Constitutional:      General: She is in acute distress.     Appearance: Normal appearance. She is well-developed. She is obese. She is not ill-appearing, toxic-appearing or diaphoretic.  HENT:     Head: Normocephalic and atraumatic.     Nose: Nose normal.     Mouth/Throat:     Mouth: Mucous membranes are moist.  Eyes:  Extraocular Movements: Extraocular movements intact.     Pupils: Pupils are equal, round, and reactive to light.  Cardiovascular:     Rate and Rhythm: Normal rate and regular rhythm.     Pulses: Normal pulses.     Heart sounds: Normal heart sounds. No murmur heard. No friction rub. No gallop.   Pulmonary:     Effort: Pulmonary effort is normal. No respiratory distress.     Breath sounds: Normal breath sounds. No stridor. No wheezing, rhonchi or rales.  Skin:    General: Skin is warm and dry.     Findings: No rash.  Neurological:     Mental Status: She is alert and oriented to person, place, and time.  Psychiatric:        Mood and Affect: Mood normal.        Behavior: Behavior normal.        Thought Content: Thought content normal.     ED ECG REPORT   Date: 03/16/2021  Rate: 151 bpm  Rhythm: sinus tachycardia and premature supraventricular contractions  QRS Axis: right  Intervals: normal  ST/T Wave abnormalities: nonspecific T wave changes  Conduction Disutrbances:right bundle branch block  Narrative Interpretation: Sinus tachycardia 151 bpm with premature supraventricular contractions, right bundle branch block, T wave inversion in lead III, V1.  EKG is similar to previous one apart from the tachycardia  Old EKG Reviewed: changes noted  I have personally reviewed the EKG  tracing and agree with the computerized printout as noted.   Assessment and Plan :   PDMP not reviewed this encounter.  1. Unstable angina ([place])     Brenda Arnold is a 65 year old female with significant risk factors of ESRD on dialysis, diabetes, history of DVT presenting for severe midsternal chest pain today.  Patient is in need of full cardiac work-up, rule out of ACS.  On exam patient does have an irregularly irregular rhythm consistent with her history of atrial fibrillation and given her tachycardia would say she has RVR as well.  Discussed this with EMS on arrival, hydrocodone was given for her chest pain prior to arrival.  She is being transferred to [facility] by EMS for further evaluation now.   Jaynee Eagles, PA-C 03/16/21 1115

## 2021-03-16 NOTE — ED Notes (Signed)
Pt is drowsy but arounsed to pain, 3rd shock delivered at 150J synchronized

## 2021-03-16 NOTE — Plan of Care (Signed)

## 2021-03-16 NOTE — ED Notes (Signed)
Myself, Anette Guarneri, dr. Joya Gaskins, pharmacist and RT at the bedside.  Versed administered

## 2021-03-16 NOTE — ED Notes (Signed)
Pt is drowsy but now more aware of situations and understands she is going to be shocked. 4th shock delivered at 200J defibrillation not synchronized.  Pt is now awake and alert x 4 HR is now at a controlled rate.  Pt is not diaphoretic at this time and has full coloring back and verbalizes she is feeling much relief

## 2021-03-16 NOTE — ED Notes (Addendum)
HR is elevated again in 130-140's pt is arousal to sternal rub 2nd shock delivered at 100J synchronized

## 2021-03-16 NOTE — ED Notes (Signed)
Paged Cardiology for Charge Nurse for Patient

## 2021-03-16 NOTE — ED Triage Notes (Signed)
Pt reports chest pain "extreme heartburn" after finishing dialysis. Denies Diarrhea, abdominal pain, dizziness, vision changes.

## 2021-03-16 NOTE — ED Notes (Signed)
MD at bedside. 

## 2021-03-16 NOTE — ED Notes (Addendum)
Pt is drowsy but responded to 50J delivered synchronized

## 2021-03-16 NOTE — ED Provider Notes (Signed)
[facility] EMERGENCY DEPARTMENT Provider Note   CSN: [id number] Arrival date & time: 03/16/21  1129     History Chief Complaint  Patient presents with  . Chest Pain    Brenda Arnold is a 65 y.o. female.  Brenda Arnold presented to urgent care complaining of chest pain and was brought to the ED via EMS.  She has a history of end-stage renal disease and dialyzes Monday, Wednesday, and Friday.  She states that she completed a usual dialysis session today, and then she developed chest pain.  EMS gave her aspirin and 1 nitroglycerin.  She states that the pain is coming in waves.  The history is provided by the patient.  Chest Pain Pain location:  Substernal area Pain quality: pressure   Pain radiates to:  Does not radiate Pain severity:  Severe Onset quality:  Sudden Timing:  Constant Progression:  Worsening Chronicity:  New Context: at rest   Relieved by:  Nothing Worsened by:  Nothing Ineffective treatments:  Nitroglycerin Associated symptoms: diaphoresis and palpitations   Associated symptoms: no abdominal pain, no back pain, no cough, no fever, no lower extremity edema, no nausea, no shortness of breath and no vomiting   Risk factors: coronary artery disease, high cholesterol, hypertension, obesity and prior DVT/PE        Past Medical History:  Diagnosis Date  . Anemia   . Arthritis   . Asthma   . Complication of anesthesia    difficulty with getting oxygen saturation up- "patient was not aware"  Bottom of feet burning in PACU  . Constipation    because of Iron  . Diabetes mellitus    not on meds  . DVT (deep venous thrombosis) ([place]) 2019   post hip replacement - right  . Dyslipidemia   . Epistaxis 11/05/2012  . ESRD (end stage renal disease) on dialysis [facility])    "MWF; Jene[facility] Rinks" (09/15/2018)- started 02/13/2017  . History of blood transfusion   . HTN (hypertension)   . Hyperparathyroidism due to renal insufficiency ([place])   .  Obesity    s/p panniculectomy  . Paroxysmal A-fib (H[facility])    a. chronic coumadin;  b. 12/2009 Echo: EF 60-65%, Gr 1 DD.  Marland Kitchen PCOS (polycystic ovarian syndrome)   . Pericarditis 08/2019   pericarditis with pericardial effusion, s/p right VATS/pericardial window 08/31/19  . Pneumonia   . Seasonal allergies   . Sleep apnea    a. not using CPAP, last study  >8 yrs  . Vitamin D deficiency     Patient Active Problem List   Diagnosis Date Noted  . Acquired thrombophilia ([place]) 01/22/2021  . Asthma without status asthmaticus 01/22/2021  . Chronic diastolic heart failure ([place]) 01/22/2021  . Dependence on renal dialysis ([place]) 01/22/2021  . Diabetic neuropathy ([place]) 01/22/2021  . Diabetic renal disease ([place]) 01/22/2021  . Diabetic retinopathy associated with type 2 diabetes mellitus ([place]) 01/22/2021  . Hardening of the aorta (main artery of the heart) ([place]) 01/22/2021  . Hearing loss 01/22/2021  . Hypertensive heart and chronic kidney disease with heart failure and with stage 5 chronic kidney disease, or end stage renal disease ([place]) 01/22/2021  . Pure hypercholesterolemia 01/22/2021  . COVID-19 12/07/2020  . Encounter for screening for COVID-19 12/07/2020  . S/P total left hip arthroplasty 11/25/2020  . Primary osteoarthritis of left hip 11/23/2020  . Status post total replacement of left hip 11/23/2020  . Diarrhea, unspecified 09/06/2020  . Headache, unspecified 07/11/2020  .  Subacute osteomyelitis of right hand ([place]) 01/27/2020  . Finger infection 01/26/2020  . ESRD (end stage renal disease) ([place]) 01/13/2020  . Allergy, unspecified, initial encounter 09/20/2019  . ESRD on dialysis ([place])   . Physical debility 09/09/2019  . Pericardial effusion   . SOB (shortness of breath)   . Leukocytosis   . Anemia of chronic disease   . PAF (paroxysmal atrial fibrillation) ([place])   . Postoperative pain   . Cardiac tamponade   . Chest pain at rest 08/26/2019  . Tachycardia 08/26/2019  . Pericarditis  08/26/2019  . Angina pectoris, unspecified ([place]) 08/25/2019  . CAD (coronary artery disease) 11/05/2018  . A-fib ([place]) 11/05/2018  . Atrial fibrillation with RVR ([place])   . Hypotension 11/04/2018  . ESRD on hemodialysis ([place]) 11/04/2018  . DVT (deep venous thrombosis) ([place]) 11/03/2018  . Hyperkalemia 10/02/2018  . Status post total replacement of right hip 09/15/2018  . Encounter for removal of sutures 05/19/2018  . Varicose veins of bilateral lower extremities with other complications 0000000  . Hypercalcemia 05/17/2017  . Secondary hyperparathyroidism of renal origin ([place]) 02/21/2017  . Pruritus, unspecified 02/13/2017  . Pain, unspecified 02/13/2017  . Other specified coagulation defects ([place]) 02/13/2017  . Iron deficiency anemia, unspecified 02/13/2017  . Fever, unspecified 02/13/2017  . Anticoagulation goal of INR 2 to 3 09/30/2014  . Pain in lower limb 05/02/2014  . Onychomycosis due to dermatophyte 04/06/2013  . Pain in joint, ankle and foot 04/06/2013  . Bradycardia 11/06/2012  . Left-sided epistaxis 11/05/2012  . DM (diabetes mellitus) ([place]) 11/05/2012  . OSA (obstructive sleep apnea) 11/05/2012  . Acute posterior epistaxis 11/05/2012  . CKD (chronic kidney disease) 11/05/2012  . End stage renal disease ([place]) 06/25/2012  . Type 2 diabetes mellitus with complication, without long-term current use of insulin ([place]) 12/20/2009  . OBESITY 12/20/2009  . OBESITY-MORBID (>100') 12/20/2009  . Essential hypertension 12/20/2009  . Paroxysmal atrial fibrillation ([place]) 12/20/2009  . Chest pain 12/20/2009  . ABNORMAL CV (STRESS) TEST 12/20/2009    Past Surgical History:  Procedure Laterality Date  . ABDOMINAL HYSTERECTOMY     with panniculctomy  . AV FISTULA PLACEMENT  09/02/2012   Procedure: ARTERIOVENOUS (AV) FISTULA CREATION;  Surgeon: Elam Dutch, MD;  Location: [facility] OR;  Service: Vascular;  Laterality: Left;  Creation of Left Radial-Cephalic Fistula   . BREAST SURGERY      Biopsy right breast  . COLONOSCOPY    . COLONOSCOPY W/ BIOPSIES AND POLYPECTOMY    . DILATION AND CURETTAGE OF UTERUS    . KNEE ARTHROSCOPY Right   . PANNICULECTOMY    . REVISON OF ARTERIOVENOUS FISTULA Left 04/27/2014   Procedure: REVISON OF LEFT RADIAL-CEPHALIC ARTERIOVENOUS FISTULA;  Surgeon: Mal Misty, MD;  Location: [facility];  Service: Vascular;  Laterality: Left;  . REVISON OF ARTERIOVENOUS FISTULA Left 01/13/2020   Procedure: REVISON OF ARTERIOVENOUS FISTULA;  Surgeon: Marty Heck, MD;  Location: [facility];  Service: Vascular;  Laterality: Left;  . TOTAL HIP ARTHROPLASTY Right 09/15/2018  . TOTAL HIP ARTHROPLASTY Right 09/15/2018   Procedure: RIGHT TOTAL HIP ARTHROPLASTY ANTERIOR APPROACH;  Surgeon: Leandrew Koyanagi, MD;  Location: [facility];  Service: Orthopedics;  Laterality: Right;  . TOTAL HIP ARTHROPLASTY Left 11/23/2020   Procedure: LEFT TOTAL HIP ARTHROPLASTY ANTERIOR APPROACH;  Surgeon: Leandrew Koyanagi, MD;  Location: [facility];  Service: Orthopedics;  Laterality: Left;  NEEDS RNFA PLEASE  . UVULOPLASTY    . VIDEO ASSISTED THORACOSCOPY (VATS)/WEDGE RESECTION  Right 08/31/2019   Procedure: VIDEO ASSISTED THORACOSCOPY/ DRAINAGE OF PERICARDIAL EFFUSION/ PERICARDIAL WINDOW/ ABORTED PERICARDIOCENTESIS ;  Surgeon: Lajuana Matte, MD;  Location: [facility] OR;  Service: Thoracic;  Laterality: Right;     OB History   No obstetric history on file.     Family History  Problem Relation Age of Onset  . Lung cancer Father        died @ 22  . Hypertension Mother   . Diabetes Brother   . Kidney disease Brother     Social History   Tobacco Use  . Smoking status: Never Smoker  . Smokeless tobacco: Never Used  Vaping Use  . Vaping Use: Never used  Substance Use Topics  . Alcohol use: No    Alcohol/week: 0.0 standard drinks  . Drug use: No    Home Medications Prior to Admission medications   Medication Sig Start Date End Date Taking? Authorizing Provider  B Complex-C-Folic Acid  (DIALYVITE TABLET) TABS Take 1 tablet by mouth daily. 05/10/20   [provider]  bisacodyl (DULCOLAX) 5 MG EC tablet Take 5 mg by mouth daily as needed for moderate constipation.    [provider]  calcitRIOL (ROCALTROL) 0.25 MCG capsule Take 3 capsules (0.75 mcg total) by mouth every Monday, Wednesday, and Friday with hemodialysis. 09/17/19   Love, Ivan Anchors, PA-C  colchicine 0.6 MG tablet TAKE HALF TABLET BY MOUTH EVERY MONDAY, WEDNESDAY, AND FRIDAY WITH HEMODIALYSIS. Patient taking differently: Take 0.3 mg by mouth every Monday, Wednesday, and Friday. With Dialysis 05/16/20   Sueanne Margarita, MD  dextrose 50 % solution Dextrose 50% 09/13/20 09/12/21  [provider]  diphenhydrAMINE (BENADRYL) 25 MG tablet Take 25 mg by mouth daily as needed for itching.    [provider]  docusate sodium (COLACE) 100 MG capsule Take 1 capsule (100 mg total) by mouth daily as needed. 11/16/20 11/16/21  Aundra Dubin, PA-C  ELIQUIS 5 MG TABS tablet Take 5 mg by mouth 2 (two) times daily.  12/19/18   [provider]  ethyl chloride spray Apply 1 application topically daily as needed (port access).  11/05/19   [provider]  gabapentin (NEURONTIN) 100 MG capsule Take 100 mg by mouth at bedtime. 05/14/17   [provider]  Menthol-Methyl Salicylate (MUSCLE RUB) 10-15 % CREA Apply 1 application topically as needed for muscle pain. 09/17/19   Love, Ivan Anchors, PA-C  methocarbamol (ROBAXIN) 500 MG tablet Take 1 tablet (500 mg total) by mouth 2 (two) times daily as needed. To be taken after surgery 11/16/20   Aundra Dubin, PA-C  Methoxy PEG-Epoetin Beta (MIRCERA IJ) Mircera 10/11/20 12/21/21  [provider]  midodrine (PROAMATINE) 10 MG tablet Take 1 tablet (10 mg total) by mouth 3 (three) times daily with meals. 09/09/19   Dessa Phi, DO  oxyCODONE (ROXICODONE) 5 MG immediate release tablet Take 1 tablet (5 mg total) by mouth every 6 (six) hours  as needed for severe pain. 11/24/20   Leandrew Koyanagi, MD  sucroferric oxyhydroxide (VELPHORO) 500 MG chewable tablet Chew 500-1,000 mg by mouth See admin instructions. Take 1000 mg 3 times daily with meals and 500 mg daily with a snack. 09/28/19   [provider]    Allergies    Iodine, Other, Shellfish allergy, Shellfish-derived products, Amlodipine, Felodipine, and Lisinopril  Review of Systems   Review of Systems  Constitutional: Positive for diaphoresis. Negative for chills and fever.  HENT: Negative for ear pain and  sore throat.   Eyes: Negative for pain and visual disturbance.  Respiratory: Negative for cough and shortness of breath.   Cardiovascular: Positive for chest pain and palpitations.  Gastrointestinal: Negative for abdominal pain, nausea and vomiting.  Genitourinary: Negative for dysuria and hematuria.  Musculoskeletal: Negative for arthralgias and back pain.  Skin: Negative for color change and rash.  Neurological: Negative for seizures and syncope.  All other systems reviewed and are negative.   Physical Exam Updated Vital Signs BP (!) 80/52   Pulse 96   Temp 97.6 F (36.4 C)   Resp 19   SpO2 99%   Physical Exam Vitals and nursing note reviewed.  Constitutional:      General: She is not in acute distress.    Appearance: She is well-developed. She is ill-appearing and diaphoretic.  HENT:     Head: Normocephalic and atraumatic.  Eyes:     Conjunctiva/sclera: Conjunctivae normal.  Cardiovascular:     Rate and Rhythm: Regular rhythm. Tachycardia present.     Heart sounds: No murmur heard.   Pulmonary:     Effort: Pulmonary effort is normal. No respiratory distress.     Breath sounds: Normal breath sounds.  Abdominal:     Palpations: Abdomen is soft.     Tenderness: There is no abdominal tenderness.  Musculoskeletal:     Cervical back: Neck supple.  Skin:    General: Skin is warm.     Comments: cool  Neurological:     Mental Status: She is  alert.     ED Results / Procedures / Treatments   Labs (all labs ordered are listed, but only abnormal results are displayed) Labs Reviewed  COMPREHENSIVE METABOLIC PANEL - Abnormal; Notable for the following components:      Result Value   Potassium 3.4 (*)    Chloride 96 (*)    Glucose, Bld 195 (*)    Creatinine, Ser 5.07 (*)    Alkaline Phosphatase 260 (*)    GFR, Estimated 9 (*)    Anion gap 17 (*)    All other components within normal limits  D-DIMER, QUANTITATIVE - Abnormal; Notable for the following components:   D-Dimer, Quant 1.13 (*)    All other components within normal limits  CBG MONITORING, ED - Abnormal; Notable for the following components:   Glucose-Capillary 164 (*)    All other components within normal limits  TROPONIN I (HIGH SENSITIVITY) - Abnormal; Notable for the following components:   Troponin I (High Sensitivity) 51 (*)    All other components within normal limits  TSH  CBC  HIV ANTIBODY (ROUTINE TESTING W REFLEX)  TROPONIN I (HIGH SENSITIVITY)    EKG EKG Interpretation  Date/Time:  Friday March 16 2021 12:49:15 EDT Ventricular Rate:  110 PR Interval:  143 QRS Duration: 126 QT Interval:  381 QTC Calculation: 516 R Axis:   209 Text Interpretation: Age not entered, assumed to be  65 years old for purpose of ECG interpretation Sinus tachycardia RBBB and LPFB Inferior infarct, old Lateral leads are also involved regular rhythm Confirmed by Lorre Munroe ([id number]) on 03/16/2021 1:09:31 PM   Radiology DG Chest Port 1 View  Result Date: 03/16/2021 CLINICAL DATA:  Pt with past medical history of ESRD on dialysis, paroxysmal atrial fibrillation, uncontrolled diabetes, pericarditis presenting for acute onset this morning of midsternal chest pain rated 7 out of 10 with associated shortness of breath. EXAM: PORTABLE CHEST 1 VIEW COMPARISON:  11/21/2020 and older exams. FINDINGS: Cardiac silhouette normal  in size and configuration. No mediastinal or hilar  masses. Linear opacities noted in both lung bases consistent with chronic bronchial wall thickening and atelectasis, similar to prior studies. Remainder of the lungs is clear. No convincing pleural effusion and no pneumothorax. Skeletal structures are grossly intact. IMPRESSION: No acute cardiopulmonary disease. Electronically Signed   By: Lajean Manes M.D.   On: 03/16/2021 12:32   ECHOCARDIOGRAM COMPLETE  Result Date: 03/16/2021    ECHOCARDIOGRAM REPORT   Patient Name:   Brenda Arnold Date of Exam: 03/16/2021 Medical Rec #:  [id number]          Height:       66.0 in Accession #:    [id number]         Weight:       234.0 lb Date of Birth:  Jul 08, 1956          BSA:          2.138 m Patient Age:    48 years           BP:           84/54 mmHg Patient Gender: F                  HR:           93 bpm. Exam Location:  Inpatient Procedure: 2D Echo, Cardiac Doppler and Color Doppler                           STAT ECHO Reported to: Dr Buford Dresser on 03/16/2021 1:55:00 PM       Dr. Darcel Bayley bedside to review images. Indications:    Ventricular tachycardia  History:        Patient has prior history of Echocardiogram examinations, most                 recent 02/04/2020. Arrythmias:Atrial Fibrillation; Risk                 Factors:Hypertension, Dyslipidemia and Diabetes. ESRD.  Sonographer:    Clayton Lefort RDCS (AE) Referring Phys: [id number] Donato Heinz  Sonographer Comments: Patient is morbidly obese. IMPRESSIONS  1. Left ventricular ejection fraction, by estimation, is 70 to 75%. The left ventricle has hyperdynamic function. The left ventricle has no regional wall motion abnormalities. There is moderate concentric left ventricular hypertrophy. Left ventricular diastolic parameters were normal.  2. Right ventricular systolic function is normal. The right ventricular size is normal. There is normal pulmonary artery systolic pressure.  3. The mitral valve is grossly normal. Trivial mitral valve  regurgitation. No evidence of mitral stenosis.  4. The aortic valve is tricuspid. There is moderate calcification of the aortic valve. There is mild thickening of the aortic valve. Aortic valve regurgitation is not visualized. Mild to moderate aortic valve sclerosis/calcification is present, without any evidence of aortic stenosis.  5. The inferior vena cava is normal in size with <50% respiratory variability, suggesting right atrial pressure of 8 mmHg. Comparison(s): No significant change from prior study. Conclusion(s)/Recommendation(s): Otherwise normal echocardiogram, with minor abnormalities described in the report. FINDINGS  Left Ventricle: Left ventricular ejection fraction, by estimation, is 70 to 75%. The left ventricle has hyperdynamic function. The left ventricle has no regional wall motion abnormalities. The left ventricular internal cavity size was normal in size. There is moderate concentric left ventricular hypertrophy. Left ventricular diastolic parameters were normal. Right Ventricle: The right ventricular size is normal. Right vetricular  wall thickness was not well visualized. Right ventricular systolic function is normal. There is normal pulmonary artery systolic pressure. The tricuspid regurgitant velocity is 2.16 m/s, and with an assumed right atrial pressure of 8 mmHg, the estimated right ventricular systolic pressure is [id number] mmHg. Left Atrium: Left atrial size was normal in size. Right Atrium: Right atrial size was normal in size. Pericardium: There is no evidence of pericardial effusion. Mitral Valve: The mitral valve is grossly normal. Trivial mitral valve regurgitation. No evidence of mitral valve stenosis. Tricuspid Valve: The tricuspid valve is normal in structure. Tricuspid valve regurgitation is trivial. Aortic Valve: The aortic valve is tricuspid. There is moderate calcification of the aortic valve. There is mild thickening of the aortic valve. Aortic valve regurgitation is not  visualized. Mild to moderate aortic valve sclerosis/calcification is present, without any evidence of aortic stenosis. Aortic valve mean gradient measures 4.0 mmHg. Aortic valve peak gradient measures 6.8 mmHg. Aortic valve area, by VTI measures 2.12 cm. Pulmonic Valve: The pulmonic valve was not well visualized. Pulmonic valve regurgitation is not visualized. No evidence of pulmonic stenosis. Aorta: The aortic root and ascending aorta are structurally normal, with no evidence of dilitation. Venous: The inferior vena cava is normal in size with less than 50% respiratory variability, suggesting right atrial pressure of 8 mmHg. IAS/Shunts: The atrial septum is grossly normal.  LEFT VENTRICLE PLAX 2D LVIDd:         3.20 cm LVIDs:         1.90 cm LV PW:         1.60 cm LV IVS:        1.50 cm LVOT diam:     1.80 cm LV SV:         51 LV SV Index:   24 LVOT Area:     2.54 cm  RIGHT VENTRICLE             IVC RV Basal diam:  2.90 cm     IVC diam: 1.95 cm RV S prime:     11.50 cm/s TAPSE (M-mode): 2.0 cm LEFT ATRIUM             Index       RIGHT ATRIUM           Index LA diam:        2.70 cm 1.26 cm/m  RA Area:     15.60 cm LA Vol (A2C):   30.6 ml 14.31 ml/m RA Volume:   38.60 ml  18.06 ml/m LA Vol (A4C):   23.5 ml 10.99 ml/m LA Biplane Vol: 28.4 ml 13.28 ml/m  AORTIC VALVE AV Area (Vmax):    1.92 cm AV Area (Vmean):   2.06 cm AV Area (VTI):     2.12 cm AV Vmax:           130.00 cm/s AV Vmean:          96.200 cm/s AV VTI:            0.242 m AV Peak Grad:      6.8 mmHg AV Mean Grad:      4.0 mmHg LVOT Vmax:         97.90 cm/s LVOT Vmean:        77.900 cm/s LVOT VTI:          0.202 m LVOT/AV VTI ratio: 0.83  AORTA Ao Root diam: 2.90 cm Ao Asc diam:  3.10 cm TRICUSPID VALVE TR Peak grad:   18.7 mmHg  TR Vmax:        216.00 cm/s  SHUNTS Systemic VTI:  0.20 m Systemic Diam: 1.80 cm Buford Dresser MD Electronically signed by Buford Dresser MD Signature Date/Time: 03/16/2021/2:47:08 PM    Final      Procedures .Cardioversion  Date/Time: 03/16/2021 1:00 PM Performed by: Arnaldo Natal, MD Authorized by: Arnaldo Natal, MD   Consent:    Consent obtained:  Emergent situation and verbal   Consent given by:  Patient   Risks discussed:  Cutaneous burn, death, induced arrhythmia and pain   Alternatives discussed:  No treatment, rate-control medication, alternative treatment, referral, delayed treatment and observation Pre-procedure details:    Cardioversion basis:  Emergent   Rhythm:  Atrial fibrillation   Electrode placement:  Anterior-posterior Patient sedated: Yes. Refer to sedation procedure documentation for details of sedation.  Attempt one:    Cardioversion mode:  Synchronous   Waveform:  Biphasic   Shock (Joules):  50   Shock outcome:  No change in rhythm Attempt two:    Cardioversion mode:  Synchronous   Waveform:  Biphasic   Shock (Joules):  100   Shock outcome:  No change in rhythm Attempt three:    Cardioversion mode:  Synchronous   Shock (Joules):  150   Shock outcome:  Conversion to ventricular tachycardia Post-procedure details:    Patient status:  Awake   Patient tolerance of procedure:  Tolerated well, no immediate complications .Sedation  Date/Time: 03/16/2021 1:01 PM Performed by: Arnaldo Natal, MD Authorized by: Arnaldo Natal, MD   Consent:    Consent obtained:  Verbal and emergent situation   Consent given by:  Patient   Risks discussed:  Allergic reaction, dysrhythmia, inadequate sedation, nausea, prolonged hypoxia resulting in organ damage, respiratory compromise necessitating ventilatory assistance and intubation, prolonged sedation necessitating reversal and vomiting Universal protocol:    Immediately prior to procedure, a time out was called: yes   Indications:    Procedure performed:  Cardioversion   Procedure necessitating sedation performed by:  Physician performing sedation Pre-sedation assessment:    Time since last food or drink:   4 hours   ASA classification: class 4 - patient with severe systemic disease that is a constant threat to life     Mouth opening:  3 or more finger widths   Mallampati score:  II - soft palate, uvula, fauces visible   Neck mobility: normal     Pre-sedation assessments completed and reviewed: airway patency, cardiovascular function, hydration status, mental status, nausea/vomiting, pain level, respiratory function and temperature     Pre-sedation assessment completed:  03/16/2021 12:00 PM Immediate pre-procedure details:    Reassessment: Patient reassessed immediately prior to procedure     Reviewed: vital signs, relevant labs/tests and NPO status     Verified: bag valve mask available, emergency equipment available, intubation equipment available, IV patency confirmed, oxygen available and suction available   Procedure details (see MAR for exact dosages):    Preoxygenation:  Nasal cannula   Sedation:  Midazolam   Intended level of sedation: moderate (conscious sedation)   Analgesia:  None   Intra-procedure monitoring:  Blood pressure monitoring, cardiac monitor, continuous capnometry, continuous pulse oximetry, frequent LOC assessments and frequent vital sign checks   Intra-procedure events: none     Total Provider sedation time (minutes):  20 Post-procedure details:    Post-sedation assessment completed:  03/16/2021 1:04 PM   Attendance: Constant attendance by certified staff until patient recovered     Recovery:  Patient returned to pre-procedure baseline     Post-sedation assessments completed and reviewed: airway patency, cardiovascular function, hydration status, mental status, nausea/vomiting, pain level, respiratory function and temperature     Patient is stable for discharge or admission: yes     Procedure completion:  Tolerated well, no immediate complications .Critical Care Performed by: Arnaldo Natal, MD Authorized by: Arnaldo Natal, MD   Critical care provider statement:     Critical care time (minutes):  45   Critical care time was exclusive of:  Separately billable procedures and treating other patients and teaching time   Critical care was necessary to treat or prevent imminent or life-threatening deterioration of the following conditions:  Cardiac failure, circulatory failure and shock   Critical care was time spent personally by me on the following activities:  Discussions with consultants, evaluation of patient's response to treatment, examination of patient, ordering and performing treatments and interventions, ordering and review of laboratory studies, ordering and review of radiographic studies, pulse oximetry, re-evaluation of patient's condition, obtaining history from patient or surrogate and review of old charts   I assumed direction of critical care for this patient from another provider in my specialty: no     Care discussed with: admitting provider       Medications Ordered in ED Medications  amiodarone (NEXTERONE) 1.8 mg/mL load via infusion 150 mg (150 mg Intravenous Bolus from Bag 03/16/21 1249)    Followed by  amiodarone (NEXTERONE PREMIX) 360-4.14 MG/200ML-% (1.8 mg/mL) IV infusion (60 mg/hr Intravenous New Bag/Given 03/16/21 1252)    Followed by  amiodarone (NEXTERONE PREMIX) 360-4.14 MG/200ML-% (1.8 mg/mL) IV infusion (has no administration in time range)  colchicine tablet 0.3 mg (has no administration in time range)  oxyCODONE (Oxy IR/ROXICODONE) immediate release tablet 5 mg (has no administration in time range)  midodrine (PROAMATINE) tablet 10 mg (has no administration in time range)  calcitRIOL (ROCALTROL) capsule 0.75 mcg (has no administration in time range)  bisacodyl (DULCOLAX) EC tablet 5 mg (has no administration in time range)  docusate sodium (COLACE) capsule 100 mg (has no administration in time range)  sucroferric oxyhydroxide (VELPHORO) chewable tablet 500-1,000 mg (has no administration in time range)  apixaban (ELIQUIS)  tablet 5 mg (has no administration in time range)  gabapentin (NEURONTIN) capsule 100 mg (has no administration in time range)  methocarbamol (ROBAXIN) tablet 500 mg (has no administration in time range)  diphenhydrAMINE (BENADRYL) tablet 25 mg (has no administration in time range)  ondansetron (ZOFRAN) tablet 4 mg (has no administration in time range)    Or  ondansetron (ZOFRAN) injection 4 mg (has no administration in time range)  midazolam (VERSED) injection 2 mg (2 mg Intravenous Given 03/16/21 1236)  sodium chloride 0.9 % bolus 250 mL (0 mLs Intravenous Stopped 03/16/21 1355)    ED Course  I have reviewed the triage vital signs and the nursing notes.  Pertinent labs & imaging results that were available during my care of the patient were reviewed by me and considered in my medical decision making (see chart for details).  Clinical Course as of 03/16/21 1536  Fri Mar 16, 2021  1304 I spoke to cardiology on call who will see the patient in the ED. [AW]  1340 Dr. Gilman Schmidt with cardiology recommends patient be admitted to medicine.  [AW]  1340 I spoke with Dr. Teryl Lucy with [facility]. [AW]    Clinical Course User Index [AW] Arnaldo Natal, MD   MDM Rules/Calculators/A&P  Brenda Amsterdam Koppel presented to the ED via EMS with chest pain.  She was noted to be in atrial fibrillation.  At times her QRS intervals were prolonged, and atrial fibrillation appeared to be apparent.  She looks quite ill with diaphoresis and cool skin.  Initially though, her blood pressure was adequate.  I decided to treat her with amiodarone, but immediately prior to the drug being started, her blood pressure was noted to be quite low.  This was confirmed with a manual blood pressure.  Furthermore, the pulse oximeter was not reading, and the clinical picture appeared to be consistent with the automated blood pressure readings.  Therefore, and it was determined that she would need cardioversion.  She was  given Versed for sedation, and cardioversion followed by defibrillation as noted above in the procedure notes were performed.  After her defibrillation, she was more alert and well perfused with a stabilized blood pressure.  Her blood pressure is typically slightly low secondary to her end-stage renal disease.  Consultation from cardiology was obtained, and the patient will be admitted for further evaluation. Final Clinical Impression(s) / ED Diagnoses Final diagnoses:  Hypertensive heart and chronic kidney disease with heart failure and with stage 5 chronic kidney disease, or end stage renal disease (HCC)  Morbid (severe) obesity due to excess calories (HCC)  Atherosclerosis of native coronary artery of native heart with angina pectoris ([place])  Longstanding persistent atrial fibrillation [facility])    Rx / DC Orders ED Discharge Orders    None       Arnaldo Natal, MD 03/16/21 1538

## 2021-03-16 NOTE — ED Triage Notes (Signed)
EMS was called for Afib RVR 100-170 with CP

## 2021-03-16 NOTE — ED Notes (Signed)
Patient is being discharged from the Urgent Care and sent to the Emergency Department via EMS . Per provider Jaynee Eagles, patient is in need of higher level of care due to unstable angina. Patient is aware and verbalizes understanding of plan of care.   Vitals:   03/16/21 1047  BP: (!) 126/106

## 2021-03-16 NOTE — Consult Note (Addendum)
Cardiology Consultation:   Patient ID: CAGNEY FALSO MRN: [id number]; DOB: [birth year]  Admit date: 03/16/2021 Date of Consult: 03/16/2021  PCP:  Kelton Pillar, MD   [place]  Cardiologist:  Fransico Him, MD  Advanced Practice Provider:  No care team member to display Electrophysiologist:  None   Click here to update Patient Care Team and Refresh Note - MD (PCP) or APP (Team Member)  Change PCP Type for MD, Specialty for APP is either Cardiology or Clinical Cardiac Electrophysiology  :[id number]    Patient Profile:   Brenda Arnold is a 65 y.o. female with a hx of ESRD, paroxysmal atrial fibrillation on Eliquis, cardiac tamponade in 2020 status post pericardial window who is being seen today for the evaluation of Afib with RVR at the request of Dr Joya Gaskins.  History of Present Illness:   Brenda Arnold is a 65 year old female with a history of ESRD, paroxysmal atrial fibrillation on Eliquis, cardiac tamponade in 2020 status post pericardial window who we are consulted to see by Dr. Joya Gaskins for A. fib with RVR.  Brenda Arnold was in her usual state of health until this morning.  She underwent dialysis today.  She typically becomes hypotensive during dialysis.  Takes midodrine 10 mg 3 times daily but usually takes an extra dose on her dialysis days due to hypotension.  Reports that following dialysis developed severe heartburn and started to feel lightheaded.  Given the symptoms, she went to the ED.  In the ED, initial EKG showed atrial fibrillation, rate 151, right bundle branch block.  BP is low as SBP 50s.  Labs show creatinine 5.1, potassium 3.4, troponin 10.  Chest x-ray unremarkable.  Given her hypotension and tachycardia, decision was made to cardiovert.  She underwent failed cardioversion x3 (at 50, 100, 150 J).  Following this there was concern that she was in VT and she underwent defibrillation at 200 J, and subsequently converted to sinus rhythm.  BP  improved to 80s over 50s.  Cardiology was consulted.  She currently states that she feels back to normal.  Denies any chest pain or dyspnea.     Past Medical History:  Diagnosis Date  . Anemia   . Arthritis   . Asthma   . Complication of anesthesia    difficulty with getting oxygen saturation up- "patient was not aware"  Bottom of feet burning in PACU  . Constipation    because of Iron  . Diabetes mellitus    not on meds  . DVT (deep venous thrombosis) ([place]) 2019   post hip replacement - right  . Dyslipidemia   . Epistaxis 11/05/2012  . ESRD (end stage renal disease) on dialysis [facility])    "MWF; [facility]" (09/15/2018)- started 02/13/2017  . History of blood transfusion   . HTN (hypertension)   . Hyperparathyroidism due to renal insufficiency ([place])   . Obesity    s/p panniculectomy  . Paroxysmal A-fib (HCC)    a. chronic coumadin;  b. 12/2009 Echo: EF 60-65%, Gr 1 DD.  Marland Kitchen PCOS (polycystic ovarian syndrome)   . Pericarditis 08/2019   pericarditis with pericardial effusion, s/p right VATS/pericardial window 08/31/19  . Pneumonia   . Seasonal allergies   . Sleep apnea    a. not using CPAP, last study  >8 yrs  . Vitamin D deficiency     Past Surgical History:  Procedure Laterality Date  . ABDOMINAL HYSTERECTOMY     with panniculctomy  . AV FISTULA PLACEMENT  09/02/2012   Procedure: ARTERIOVENOUS (AV) FISTULA CREATION;  Surgeon: Elam Dutch, MD;  Location: [facility] OR;  Service: Vascular;  Laterality: Left;  Creation of Left Radial-Cephalic Fistula   . BREAST SURGERY     Biopsy right breast  . COLONOSCOPY    . COLONOSCOPY W/ BIOPSIES AND POLYPECTOMY    . DILATION AND CURETTAGE OF UTERUS    . KNEE ARTHROSCOPY Right   . PANNICULECTOMY    . REVISON OF ARTERIOVENOUS FISTULA Left 04/27/2014   Procedure: REVISON OF LEFT RADIAL-CEPHALIC ARTERIOVENOUS FISTULA;  Surgeon: Mal Misty, MD;  Location: [place];  Service: Vascular;  Laterality: Left;  . REVISON OF ARTERIOVENOUS FISTULA  Left 01/13/2020   Procedure: REVISON OF ARTERIOVENOUS FISTULA;  Surgeon: Marty Heck, MD;  Location: [place];  Service: Vascular;  Laterality: Left;  . TOTAL HIP ARTHROPLASTY Right 09/15/2018  . TOTAL HIP ARTHROPLASTY Right 09/15/2018   Procedure: RIGHT TOTAL HIP ARTHROPLASTY ANTERIOR APPROACH;  Surgeon: Leandrew Koyanagi, MD;  Location: [place];  Service: Orthopedics;  Laterality: Right;  . TOTAL HIP ARTHROPLASTY Left 11/23/2020   Procedure: LEFT TOTAL HIP ARTHROPLASTY ANTERIOR APPROACH;  Surgeon: Leandrew Koyanagi, MD;  Location: [place];  Service: Orthopedics;  Laterality: Left;  NEEDS RNFA PLEASE  . UVULOPLASTY    . VIDEO ASSISTED THORACOSCOPY (VATS)/WEDGE RESECTION Right 08/31/2019   Procedure: VIDEO ASSISTED THORACOSCOPY/ DRAINAGE OF PERICARDIAL EFFUSION/ PERICARDIAL WINDOW/ ABORTED PERICARDIOCENTESIS ;  Surgeon: Lajuana Matte, MD;  Location: [facility] OR;  Service: Thoracic;  Laterality: Right;       Inpatient Medications: Scheduled Meds:  Continuous Infusions: . amiodarone 60 mg/hr (03/16/21 1252)   Followed by  . amiodarone     PRN Meds:   Allergies:    Allergies  Allergen Reactions  . Iodine Shortness Of Breath  . Other Shortness Of Breath and Anaphylaxis  . Shellfish Allergy Shortness Of Breath  . Shellfish-Derived Products Shortness Of Breath  . Amlodipine Swelling and Other (See Comments)    Edema  . Felodipine Other (See Comments)    Headache  . Lisinopril Cough    Social History:   Social History   Socioeconomic History  . Marital status: Single    Spouse name: Not on file  . Number of children: Not on file  . Years of education: Not on file  . Highest education level: Not on file  Occupational History  . Occupation: Dance movement psychotherapist.    Employer: [employer]  Tobacco Use  . Smoking status: Never Smoker  . Smokeless tobacco: Never Used  Vaping Use  . Vaping Use: Never used  Substance and Sexual Activity  . Alcohol use: No     Alcohol/week: 0.0 standard drinks  . Drug use: No  . Sexual activity: Not on file    Comment: Hysterectomy  Other Topics Concern  . Not on file  Social History Narrative   Lives in [place] alone. 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   Social Determinants of Health   Financial Resource Strain: Not on file  Food Insecurity: Not on file  Transportation Needs: Not on file  Physical Activity: Not on file  Stress: Not on file  Social Connections: Not on file  Intimate Partner Violence: Not on file    Family History:    Family History  Problem Relation Age of Onset  . Lung cancer Father        died @ 53  . Hypertension Mother   . Diabetes Brother   .  Kidney disease Brother      ROS:  Please see the history of present illness.   All other ROS reviewed and negative.     Physical Exam/Data:   Vitals:   03/16/21 1258 03/16/21 1300 03/16/21 1310 03/16/21 1320  BP: (!) 75/50 (!) 70/34 (!) 85/53 (!) 83/44  Pulse: 100 98 95 94  Resp: '13 14 12 16  '$ Temp:      SpO2: 100% 95% 100% 100%   No intake or output data in the 24 hours ending 03/16/21 1349 Last 3 Weights 02/21/2021 11/29/2020 11/29/2020  Weight (lbs) 234 lb 236 lb 1.8 oz 240 lb 8.4 oz  Weight (kg) 106.142 kg 107.1 kg 109.1 kg     There is no height or weight on file to calculate BMI.  General:  in no acute distress HEENT: normal Lymph: no adenopathy Neck: no JVD Cardiac:  normal S1, S2; RRR; no murmur  Lungs:  clear to auscultation bilaterally, no wheezing, rhonchi or rales  Abd: soft, nontender, no hepatomegaly  Ext: no edema Musculoskeletal:  No deformities, BUE and BLE strength normal and equal Skin: warm and dry  Neuro:  CNs 2-12 intact, no focal abnormalities noted Psych:  Normal affect   EKG:  The EKG was personally reviewed and demonstrates: Atrial fibrillation, rate 151, RBBB Telemetry:  Telemetry was personally reviewed and demonstrates: Atrial fibrillation with rates up to 180.   Currently in sinus rhythm  Relevant CV Studies:   Laboratory Data:  High Sensitivity Troponin:   Recent Labs  Lab 03/16/21 1155  TROPONINIHS 10     Chemistry Recent Labs  Lab 03/16/21 1155  NA 138  K 3.4*  CL 96*  CO2 25  GLUCOSE 195*  BUN 16  CREATININE 5.07*  CALCIUM 9.8  GFRNONAA 9*  ANIONGAP 17*    Recent Labs  Lab 03/16/21 1155  PROT 6.8  ALBUMIN 3.6  AST 31  ALT 26  ALKPHOS 260*  BILITOT 0.7   HematologyNo results for input(s): WBC, RBC, HGB, HCT, MCV, MCH, MCHC, RDW, PLT in the last 168 hours. BNPNo results for input(s): BNP, PROBNP in the last 168 hours.  DDimer  Recent Labs  Lab 03/16/21 1155  DDIMER 1.13*     Radiology/Studies:  DG Chest Port 1 View  Result Date: 03/16/2021 CLINICAL DATA:  Pt with past medical history of ESRD on dialysis, paroxysmal atrial fibrillation, uncontrolled diabetes, pericarditis presenting for acute onset this morning of midsternal chest pain rated 7 out of 10 with associated shortness of breath. EXAM: PORTABLE CHEST 1 VIEW COMPARISON:  11/21/2020 and older exams. FINDINGS: Cardiac silhouette normal in size and configuration. No mediastinal or hilar masses. Linear opacities noted in both lung bases consistent with chronic bronchial wall thickening and atelectasis, similar to prior studies. Remainder of the lungs is clear. No convincing pleural effusion and no pneumothorax. Skeletal structures are grossly intact. IMPRESSION: No acute cardiopulmonary disease. Electronically Signed   By: Lajean Manes M.D.   On: 03/16/2021 12:32     Assessment and Plan:   Atrial fibrillation with RVR: Presented with A. fib to the 170s and hypotension.  Underwent failed cardioversion x3 in the ED. Subsequently there was concern for VT and she was defibrillated, with conversion to sinus rhythm.  On my review, I think that she was still in A. fib with RVR when she was defibrillated, as did not appear to be a change in her QRS morphology (baseline  RBBB) and rhythm remained irregular.  Echocardiogram shows hyperdynamic  LV function, no pericardial effusion -Agree with amiodarone drip to maintain sinus rhythm.  She previously was on amiodarone but had been discontinued.  Will plan to load with amiodarone to maintain sinus rhythm given her hemodynamic instability when in A. fib today -Continue Eliquis  Chest pain: suspect due to AF with RVR.  No WMA on echo.  Will trend troponins.  Hypotension: Has baseline hypotension requiring midodrine 10 mg 3-4 times daily.  Worsening hypotension today due to A. fib with RVR as above.  Pressure currently is 80s over 7s and she reports she is asymptomatic -Continue midodrine  History of tamponade status post pericardial window: No effusion on echo today  ESRD: On HD  For questions or updates, please contact [place] Please consult [email or web address] for contact info under    Signed, Donato Heinz, MD  03/16/2021 1:49 PM

## 2021-03-16 NOTE — Progress Notes (Signed)
  Echocardiogram 2D Echocardiogram has been performed.  Brenda Arnold 03/16/2021, 1:52 PM

## 2021-03-16 NOTE — H&P (Addendum)
History and Physical  Brenda Arnold [id number] DOB: [birth year] DOA: 03/16/2021  Referring physician: Dr. Lorre Munroe, MD PCP: Kelton Pillar, MD  Outpatient Specialists: Acquanetta Sit, DPM, (podiatry), Dr. Fransico Him (Cardiology) Patient coming from: Home  Chief Complaint:   HPI:[name] is a 65 year old female with medical history significant for ESRD on HD Monday Wednesday Friday, paroxysmal atrial fibrillation on Eliquis previously on amiodarone, pericarditis with pericardial effusion status post pericardial window on 08/31/2019 who presented on 4/15 with worsening chest pain and associated shortness of breath started this morning after completing her dialysis session.  At baseline she uses midodrine for hypotension particularly worsened with dialysis that typically requires an extra dose.  Patient reports immediately after completing dialysis having worsening chest pain in the center of her chest that she described as "heartburn" that felt similar to a prior episode of pericarditis in 2020.   She drove herself to a local urgent care center (as her PCPs office was closed due to the holiday) and was found to have severe chest pain with atrial fibrillation with RVR and was transferred to [facility] by EMS.  In the ED patient was found to have a heart rate in the 170s with EKG confirming A. fib and worsening blood pressure from 126/106 to 56/34 prompting emergent cardioversion by the ED physician with 3 unsuccessful attempts.  Patient then went into ventricular tachycardia requiring defibrillation and reported conversion to sinus rhythm.  Cardiology evaluated patient in the ED and started amiodarone drip to maintain sinus rhythm.  Blood pressure slowly improved to 80/52 with maps of 57 (baseline SBP's in the 90s).  Patient reported no further symptoms.   [facility] service was consulted to assist in management.  Regarding her paroxysmal atrial fibrillation her amiodarone  was stopped on 01/2020 as she was maintaining normal sinus rhythm but then restarted when seen in A. fib clinic on 05/2020 last evaluation on 01/2021 by her cardiologist she had that refilled it for the past 3 months and did not want to go back on it after discussing with her cardiologist.  She was prescribed a 2-week Zio patch to assess for arrhythmia burden but did not apply since that visit  Review of Systems:As mentioned in the history of present illness.Review of systems are otherwise negative Patient seen in the ED .   Past Medical History:  Diagnosis Date  . Anemia   . Arthritis   . Asthma   . Complication of anesthesia    difficulty with getting oxygen saturation up- "patient was not aware"  Bottom of feet burning in PACU  . Constipation    because of Iron  . Diabetes mellitus    not on meds  . DVT (deep venous thrombosis) ([place]) 2019   post hip replacement - right  . Dyslipidemia   . Epistaxis 11/05/2012  . ESRD (end stage renal disease) on dialysis [facility])    "MWF; [facility] R[facility]" (09/15/2018)- started 02/13/2017  . History of blood transfusion   . HTN (hypertension)   . Hyperparathyroidism due to renal insufficiency ([place])   . Obesity    s/p panniculectomy  . Paroxysmal A-fib (HCC)    a. chronic coumadin;  b. 12/2009 Echo: EF 60-65%, Gr 1 DD.  Marland Kitchen PCOS (polycystic ovarian syndrome)   . Pericarditis 08/2019   pericarditis with pericardial effusion, s/p right VATS/pericardial window 08/31/19  . Pneumonia   . Seasonal allergies   . Sleep apnea    a. not using CPAP, last study  >8  yrs  . Vitamin D deficiency    Past Surgical History:  Procedure Laterality Date  . ABDOMINAL HYSTERECTOMY     with panniculctomy  . AV FISTULA PLACEMENT  09/02/2012   Procedure: ARTERIOVENOUS (AV) FISTULA CREATION;  Surgeon: Elam Dutch, MD;  Location: [facility] OR;  Service: Vascular;  Laterality: Left;  Creation of Left Radial-Cephalic Fistula   . BREAST SURGERY     Biopsy right breast  . COLONOSCOPY     . COLONOSCOPY W/ BIOPSIES AND POLYPECTOMY    . DILATION AND CURETTAGE OF UTERUS    . KNEE ARTHROSCOPY Right   . PANNICULECTOMY    . REVISON OF ARTERIOVENOUS FISTULA Left 04/27/2014   Procedure: REVISON OF LEFT RADIAL-CEPHALIC ARTERIOVENOUS FISTULA;  Surgeon: Mal Misty, MD;  Location: [place];  Service: Vascular;  Laterality: Left;  . REVISON OF ARTERIOVENOUS FISTULA Left 01/13/2020   Procedure: REVISON OF ARTERIOVENOUS FISTULA;  Surgeon: Marty Heck, MD;  Location: [place];  Service: Vascular;  Laterality: Left;  . TOTAL HIP ARTHROPLASTY Right 09/15/2018  . TOTAL HIP ARTHROPLASTY Right 09/15/2018   Procedure: RIGHT TOTAL HIP ARTHROPLASTY ANTERIOR APPROACH;  Surgeon: Leandrew Koyanagi, MD;  Location: [place];  Service: Orthopedics;  Laterality: Right;  . TOTAL HIP ARTHROPLASTY Left 11/23/2020   Procedure: LEFT TOTAL HIP ARTHROPLASTY ANTERIOR APPROACH;  Surgeon: Leandrew Koyanagi, MD;  Location: [place];  Service: Orthopedics;  Laterality: Left;  NEEDS RNFA PLEASE  . UVULOPLASTY    . VIDEO ASSISTED THORACOSCOPY (VATS)/WEDGE RESECTION Right 08/31/2019   Procedure: VIDEO ASSISTED THORACOSCOPY/ DRAINAGE OF PERICARDIAL EFFUSION/ PERICARDIAL WINDOW/ ABORTED PERICARDIOCENTESIS ;  Surgeon: Lajuana Matte, MD;  Location: [place];  Service: Thoracic;  Laterality: Right;   Allergies  Allergen Reactions  . Iodine Shortness Of Breath  . Other Shortness Of Breath and Anaphylaxis  . Shellfish Allergy Shortness Of Breath  . Shellfish-Derived Products Shortness Of Breath  . Amlodipine Swelling and Other (See Comments)    Edema  . Felodipine Other (See Comments)    Headache  . Lisinopril Cough   Social History:  reports that she has never smoked. She has never used smokeless tobacco. She reports that she does not drink alcohol and does not use drugs. Family History  Problem Relation Age of Onset  . Lung cancer Father        died @ 41  . Hypertension Mother   . Diabetes Brother   . Kidney disease  Brother       Prior to Admission medications   Medication Sig Start Date End Date Taking? Authorizing Provider  B Complex-C-Folic Acid (DIALYVITE TABLET) TABS Take 1 tablet by mouth daily. 05/10/20   [provider]  bisacodyl (DULCOLAX) 5 MG EC tablet Take 5 mg by mouth daily as needed for moderate constipation.    [provider]  calcitRIOL (ROCALTROL) 0.25 MCG capsule Take 3 capsules (0.75 mcg total) by mouth every Monday, Wednesday, and Friday with hemodialysis. 09/17/19   Love, Ivan Anchors, PA-C  colchicine 0.6 MG tablet TAKE HALF TABLET BY MOUTH EVERY MONDAY, WEDNESDAY, AND FRIDAY WITH HEMODIALYSIS. Patient taking differently: Take 0.3 mg by mouth every Monday, Wednesday, and Friday. With Dialysis 05/16/20   Sueanne Margarita, MD  dextrose 50 % solution Dextrose 50% 09/13/20 09/12/21  [provider]  diphenhydrAMINE (BENADRYL) 25 MG tablet Take 25 mg by mouth daily as needed for itching.    [provider]  docusate sodium (COLACE) 100 MG capsule Take 1 capsule (100 mg  total) by mouth daily as needed. 11/16/20 11/16/21  Aundra Dubin, PA-C  ELIQUIS 5 MG TABS tablet Take 5 mg by mouth 2 (two) times daily.  12/19/18   [provider]  ethyl chloride spray Apply 1 application topically daily as needed (port access).  11/05/19   [provider]  gabapentin (NEURONTIN) 100 MG capsule Take 100 mg by mouth at bedtime. 05/14/17   [provider]  Menthol-Methyl Salicylate (MUSCLE RUB) 10-15 % CREA Apply 1 application topically as needed for muscle pain. 09/17/19   Love, Ivan Anchors, PA-C  methocarbamol (ROBAXIN) 500 MG tablet Take 1 tablet (500 mg total) by mouth 2 (two) times daily as needed. To be taken after surgery 11/16/20   Aundra Dubin, PA-C  Methoxy PEG-Epoetin Beta (MIRCERA IJ) Mircera 10/11/20 12/21/21  [provider]  midodrine (PROAMATINE) 10 MG tablet Take 1 tablet (10 mg total) by mouth 3 (three) times daily with  meals. 09/09/19   Dessa Phi, DO  oxyCODONE (ROXICODONE) 5 MG immediate release tablet Take 1 tablet (5 mg total) by mouth every 6 (six) hours as needed for severe pain. 11/24/20   Leandrew Koyanagi, MD  sucroferric oxyhydroxide (VELPHORO) 500 MG chewable tablet Chew 500-1,000 mg by mouth See admin instructions. Take 1000 mg 3 times daily with meals and 500 mg daily with a snack. 09/28/19   [provider]    Physical Exam: BP (!) 83/50 (BP Location: Right Arm)   Pulse 85   Temp 98.2 F (36.8 C) (Oral)   Resp 17   SpO2 96%   Constitutional normal appearing female, resting comfortably Eyes: EOMI, anicteric, normal conjunctivae ENMT: Oropharynx with moist mucous membranes, normal dentition Neck: no appreciable thyromegaly Cardiovascular: RRR no MRGs, with no peripheral edema Respiratory: Normal respiratory effort on room air, clear breath sounds  Abdomen: Soft,non-tender, normoactive bowel sounds Skin: No rash ulcers, or lesions. Without skin tenting  Neurologic: Grossly no focal neuro deficit. Psychiatric:Appropriate affect, and mood. Mental status AAOx3          Labs on Admission:  Basic Metabolic Panel: Recent Labs  Lab 03/16/21 1155  NA 138  K 3.4*  CL 96*  CO2 25  GLUCOSE 195*  BUN 16  CREATININE 5.07*  CALCIUM 9.8   Liver Function Tests: Recent Labs  Lab 03/16/21 1155  AST 31  ALT 26  ALKPHOS 260*  BILITOT 0.7  PROT 6.8  ALBUMIN 3.6   No results for input(s): LIPASE, AMYLASE in the last 168 hours. No results for input(s): AMMONIA in the last 168 hours. CBC: Recent Labs  Lab 03/16/21 1704  WBC 8.4  HGB 10.6*  HCT 33.6*  MCV 97.1  PLT 121*   Cardiac Enzymes: No results for input(s): CKTOTAL, CKMB, CKMBINDEX, TROPONINI in the last 168 hours.  BNP (last 3 results) No results for input(s): BNP in the last 8760 hours.  ProBNP (last 3 results) No results for input(s): PROBNP in the last 8760 hours.  CBG: Recent Labs  Lab 03/16/21 1147   GLUCAP 164*    Radiological Exams on Admission: DG Chest Port 1 View  Result Date: 03/16/2021 CLINICAL DATA:  Pt with past medical history of ESRD on dialysis, paroxysmal atrial fibrillation, uncontrolled diabetes, pericarditis presenting for acute onset this morning of midsternal chest pain rated 7 out of 10 with associated shortness of breath. EXAM: PORTABLE CHEST 1 VIEW COMPARISON:  11/21/2020 and older exams. FINDINGS: Cardiac silhouette normal in size and configuration. No mediastinal or hilar masses.  Linear opacities noted in both lung bases consistent with chronic bronchial wall thickening and atelectasis, similar to prior studies. Remainder of the lungs is clear. No convincing pleural effusion and no pneumothorax. Skeletal structures are grossly intact. IMPRESSION: No acute cardiopulmonary disease. Electronically Signed   By: Lajean Manes M.D.   On: 03/16/2021 12:32   ECHOCARDIOGRAM COMPLETE  Result Date: 03/16/2021    ECHOCARDIOGRAM REPORT   Patient Name:   Brenda Arnold Krinke Date of Exam: 03/16/2021 Medical Rec #:  [id number]          Height:       66.0 in Accession #:    [id number]         Weight:       234.0 lb Date of Birth:  [birth year]          BSA:          2.138 m Patient Age:    84 years           BP:           84/54 mmHg Patient Gender: F                  HR:           93 bpm. Exam Location:  Inpatient Procedure: 2D Echo, Cardiac Doppler and Color Doppler                           STAT ECHO Reported to: Dr Buford Dresser on 03/16/2021 1:55:00 PM       Dr. Darcel Bayley bedside to review images. Indications:    Ventricular tachycardia  History:        Patient has prior history of Echocardiogram examinations, most                 recent 02/04/2020. Arrythmias:Atrial Fibrillation; Risk                 Factors:Hypertension, Dyslipidemia and Diabetes. ESRD.  Sonographer:    Clayton Lefort RDCS (AE) Referring Phys: [id number] Donato Heinz  Sonographer Comments: Patient is morbidly  obese. IMPRESSIONS  1. Left ventricular ejection fraction, by estimation, is 70 to 75%. The left ventricle has hyperdynamic function. The left ventricle has no regional wall motion abnormalities. There is moderate concentric left ventricular hypertrophy. Left ventricular diastolic parameters were normal.  2. Right ventricular systolic function is normal. The right ventricular size is normal. There is normal pulmonary artery systolic pressure.  3. The mitral valve is grossly normal. Trivial mitral valve regurgitation. No evidence of mitral stenosis.  4. The aortic valve is tricuspid. There is moderate calcification of the aortic valve. There is mild thickening of the aortic valve. Aortic valve regurgitation is not visualized. Mild to moderate aortic valve sclerosis/calcification is present, without any evidence of aortic stenosis.  5. The inferior vena cava is normal in size with <50% respiratory variability, suggesting right atrial pressure of 8 mmHg. Comparison(s): No significant change from prior study. Conclusion(s)/Recommendation(s): Otherwise normal echocardiogram, with minor abnormalities described in the report. FINDINGS  Left Ventricle: Left ventricular ejection fraction, by estimation, is 70 to 75%. The left ventricle has hyperdynamic function. The left ventricle has no regional wall motion abnormalities. The left ventricular internal cavity size was normal in size. There is moderate concentric left ventricular hypertrophy. Left ventricular diastolic parameters were normal. Right Ventricle: The right ventricular size is normal. Right vetricular wall thickness was not well visualized. Right ventricular systolic function  is normal. There is normal pulmonary artery systolic pressure. The tricuspid regurgitant velocity is 2.16 m/s, and with an assumed right atrial pressure of 8 mmHg, the estimated right ventricular systolic pressure is [id number] mmHg. Left Atrium: Left atrial size was normal in size. Right Atrium:  Right atrial size was normal in size. Pericardium: There is no evidence of pericardial effusion. Mitral Valve: The mitral valve is grossly normal. Trivial mitral valve regurgitation. No evidence of mitral valve stenosis. Tricuspid Valve: The tricuspid valve is normal in structure. Tricuspid valve regurgitation is trivial. Aortic Valve: The aortic valve is tricuspid. There is moderate calcification of the aortic valve. There is mild thickening of the aortic valve. Aortic valve regurgitation is not visualized. Mild to moderate aortic valve sclerosis/calcification is present, without any evidence of aortic stenosis. Aortic valve mean gradient measures 4.0 mmHg. Aortic valve peak gradient measures 6.8 mmHg. Aortic valve area, by VTI measures 2.12 cm. Pulmonic Valve: The pulmonic valve was not well visualized. Pulmonic valve regurgitation is not visualized. No evidence of pulmonic stenosis. Aorta: The aortic root and ascending aorta are structurally normal, with no evidence of dilitation. Venous: The inferior vena cava is normal in size with less than 50% respiratory variability, suggesting right atrial pressure of 8 mmHg. IAS/Shunts: The atrial septum is grossly normal.  LEFT VENTRICLE PLAX 2D LVIDd:         3.20 cm LVIDs:         1.90 cm LV PW:         1.60 cm LV IVS:        1.50 cm LVOT diam:     1.80 cm LV SV:         51 LV SV Index:   24 LVOT Area:     2.54 cm  RIGHT VENTRICLE             IVC RV Basal diam:  2.90 cm     IVC diam: 1.95 cm RV S prime:     11.50 cm/s TAPSE (M-mode): 2.0 cm LEFT ATRIUM             Index       RIGHT ATRIUM           Index LA diam:        2.70 cm 1.26 cm/m  RA Area:     15.60 cm LA Vol (A2C):   30.6 ml 14.31 ml/m RA Volume:   38.60 ml  18.06 ml/m LA Vol (A4C):   23.5 ml 10.99 ml/m LA Biplane Vol: 28.4 ml 13.28 ml/m  AORTIC VALVE AV Area (Vmax):    1.92 cm AV Area (Vmean):   2.06 cm AV Area (VTI):     2.12 cm AV Vmax:           130.00 cm/s AV Vmean:          96.200 cm/s AV VTI:             0.242 m AV Peak Grad:      6.8 mmHg AV Mean Grad:      4.0 mmHg LVOT Vmax:         97.90 cm/s LVOT Vmean:        77.900 cm/s LVOT VTI:          0.202 m LVOT/AV VTI ratio: 0.83  AORTA Ao Root diam: 2.90 cm Ao Asc diam:  3.10 cm TRICUSPID VALVE TR Peak grad:   18.7 mmHg TR Vmax:  216.00 cm/s  SHUNTS Systemic VTI:  0.20 m Systemic Diam: 1.80 cm Buford Dresser MD Electronically signed by Buford Dresser MD Signature Date/Time: 03/16/2021/2:47:08 PM    Final     EKG: Independently reviewed. Supraventricular tachycardia present with HR of 151  Assessment/Plan Present on Admission: . Atrial fibrillation with RVR ([place]) . Positive D dimer . Hypotension . Elevated troponin  Active Problems:   Hypotension   Atrial fibrillation with RVR (H[facility])   Positive D dimer   Elevated troponin   Atrial fibrillation with RVR and significant hypotension, improving.  Suspect RVR exacerbated by poor adherence to amiodarone has been off for greater than 3 months, she did not refill for the first 3 months, did not comply Zio patch prescribed on 02/20/2021 by her cardiologist.  Currently heart rate well controlled and in normal sinus rhythm on amio drip.  Status post cardioversion attempts x3 defibrillation attempt x1 in ED in the setting of chest pain and hemodynamic instability.  Patient remains without any recurrent chest pain or other symptoms - Continue amiodarone drip as directed by cardiology, with plan to consolidate to oral prior to discharge - Normal EF on TTE - Monitor on telemetry - Continue Eliquis  Acute on Chronic hypotension, improving.  Initially with significant hypotension with SBP's in the 50s worse in the setting of A. fib with RVR, has baseline chronic hypotension related to ESRD with HD improved with midodrine.  With control of rhythm and heart rate blood pressure now significantly improved with SBP's in the 80s.  Per patient and confirmed on chart review SBP baseline in  the 90s.  TTE shows no repeat pericardial effusion or tamponade - Continue home midodrine - Continue close monitor blood pressure -careful with any IV fluids given ESRD status  Elevated D-dimer.  Obtained in the setting of reported shortness of breath in setting of chest pain.  Presume elevated inflammatory marker in the setting of known ESRD.  Additionally likely etiology of shortness of breath and chest pain related to A. fib with RVR.  With resolution of A. fib and RVR patient no longer with chest pain, no shortness of breath, and maintaining oxygen saturation of 96-100% on room air.  Additionally encouraged with normal right ventricular systolic function on TTE. - Continue to monitor oxygen saturation, if new O2 requirements occur despite normal sinus rhythm would warrant CTA - Venous duplex for thoroughness --continue eliquis  Elevated troponin.  Likely demand ischemia in the setting of atrial fibrillation with RVR and hemodynamic instability requiring cardioversion x3 and defibrillation.  High-sensitivity troponin 10-yesterday 1.  Currently without any chest pain - Continue to trend troponin until peaks -monitor on telemetry  ESRD on HD, Monday Wednesday Friday.  No acute need for HD currently. Normal volume status on exam  Completed full session this morning. - Continue monitor volume status - Anticipate continuing outpatient protocol Monday Wednesday Friday --continue midodrine  History of pericarditis status post pericardial window 08/2019.  TTE shows no pericardial  effusion or signs of cardiac tamponade -continue colchicine with HD  Peripheral neuropathy, stable --home gabapentin  Type 2 diabetes, last A1c 4.7 in December 2021 - Not on any medications   DVT prophylaxis: Home Eliquis  Code Status: Full code  Family Communication: None  Disposition Plan: Stable  Consults called: EDP call cardiology  Admission status: Admitted as observation to cardiac telemetry unit,  anticipate less than 2 midnight stay given after cardioversion and amiodarone infusion blood pressure returned to baseline patient now normal sinus rhythm,  need to continue monitoring hemodynamics his symptoms while awaiting cardiology recommendations to consolidate amiodarone likely in next 24 hours      Desiree Hane MD [facility]  Pager [phone]  If 7PM-7AM, please contact night-coverage [email or web address] Password [facility]  03/16/2021, 6:56 PM

## 2021-03-17 ENCOUNTER — Observation Stay (HOSPITAL_COMMUNITY): Payer: Medicare Other

## 2021-03-17 DIAGNOSIS — Z8249 Family history of ischemic heart disease and other diseases of the circulatory system: Secondary | ICD-10-CM | POA: Diagnosis not present

## 2021-03-17 DIAGNOSIS — E1140 Type 2 diabetes mellitus with diabetic neuropathy, unspecified: Secondary | ICD-10-CM | POA: Diagnosis present

## 2021-03-17 DIAGNOSIS — I9589 Other hypotension: Secondary | ICD-10-CM | POA: Diagnosis not present

## 2021-03-17 DIAGNOSIS — I4811 Longstanding persistent atrial fibrillation: Secondary | ICD-10-CM | POA: Diagnosis not present

## 2021-03-17 DIAGNOSIS — Z8616 Personal history of COVID-19: Secondary | ICD-10-CM | POA: Diagnosis not present

## 2021-03-17 DIAGNOSIS — Z86718 Personal history of other venous thrombosis and embolism: Secondary | ICD-10-CM | POA: Diagnosis not present

## 2021-03-17 DIAGNOSIS — N2581 Secondary hyperparathyroidism of renal origin: Secondary | ICD-10-CM | POA: Diagnosis present

## 2021-03-17 DIAGNOSIS — E11319 Type 2 diabetes mellitus with unspecified diabetic retinopathy without macular edema: Secondary | ICD-10-CM | POA: Diagnosis present

## 2021-03-17 DIAGNOSIS — E785 Hyperlipidemia, unspecified: Secondary | ICD-10-CM

## 2021-03-17 DIAGNOSIS — Z801 Family history of malignant neoplasm of trachea, bronchus and lung: Secondary | ICD-10-CM | POA: Diagnosis not present

## 2021-03-17 DIAGNOSIS — N186 End stage renal disease: Secondary | ICD-10-CM | POA: Diagnosis not present

## 2021-03-17 DIAGNOSIS — Z20822 Contact with and (suspected) exposure to covid-19: Secondary | ICD-10-CM | POA: Diagnosis present

## 2021-03-17 DIAGNOSIS — R7989 Other specified abnormal findings of blood chemistry: Secondary | ICD-10-CM | POA: Diagnosis not present

## 2021-03-17 DIAGNOSIS — I132 Hypertensive heart and chronic kidney disease with heart failure and with stage 5 chronic kidney disease, or end stage renal disease: Secondary | ICD-10-CM

## 2021-03-17 DIAGNOSIS — E1142 Type 2 diabetes mellitus with diabetic polyneuropathy: Secondary | ICD-10-CM | POA: Diagnosis present

## 2021-03-17 DIAGNOSIS — I25119 Atherosclerotic heart disease of native coronary artery with unspecified angina pectoris: Secondary | ICD-10-CM | POA: Diagnosis not present

## 2021-03-17 DIAGNOSIS — I120 Hypertensive chronic kidney disease with stage 5 chronic kidney disease or end stage renal disease: Secondary | ICD-10-CM | POA: Diagnosis present

## 2021-03-17 DIAGNOSIS — I4891 Unspecified atrial fibrillation: Secondary | ICD-10-CM | POA: Diagnosis not present

## 2021-03-17 DIAGNOSIS — I248 Other forms of acute ischemic heart disease: Secondary | ICD-10-CM | POA: Diagnosis not present

## 2021-03-17 DIAGNOSIS — Z6837 Body mass index (BMI) 37.0-37.9, adult: Secondary | ICD-10-CM | POA: Diagnosis not present

## 2021-03-17 DIAGNOSIS — Z96643 Presence of artificial hip joint, bilateral: Secondary | ICD-10-CM | POA: Diagnosis present

## 2021-03-17 DIAGNOSIS — I953 Hypotension of hemodialysis: Secondary | ICD-10-CM | POA: Diagnosis present

## 2021-03-17 DIAGNOSIS — E669 Obesity, unspecified: Secondary | ICD-10-CM | POA: Diagnosis present

## 2021-03-17 DIAGNOSIS — Z992 Dependence on renal dialysis: Secondary | ICD-10-CM

## 2021-03-17 DIAGNOSIS — E7800 Pure hypercholesterolemia, unspecified: Secondary | ICD-10-CM | POA: Diagnosis present

## 2021-03-17 DIAGNOSIS — I472 Ventricular tachycardia: Secondary | ICD-10-CM | POA: Diagnosis present

## 2021-03-17 DIAGNOSIS — E282 Polycystic ovarian syndrome: Secondary | ICD-10-CM | POA: Diagnosis present

## 2021-03-17 DIAGNOSIS — E1122 Type 2 diabetes mellitus with diabetic chronic kidney disease: Secondary | ICD-10-CM | POA: Diagnosis present

## 2021-03-17 DIAGNOSIS — R778 Other specified abnormalities of plasma proteins: Secondary | ICD-10-CM | POA: Diagnosis not present

## 2021-03-17 LAB — CBC WITH DIFFERENTIAL/PLATELET
Abs Immature Granulocytes: 0.02 10*3/uL (ref 0.00–0.07)
Basophils Absolute: 0 10*3/uL (ref 0.0–0.1)
Basophils Relative: 1 %
Eosinophils Absolute: 0.2 10*3/uL (ref 0.0–0.5)
Eosinophils Relative: 3 %
HCT: 34.2 % — ABNORMAL LOW (ref 36.0–46.0)
Hemoglobin: 11 g/dL — ABNORMAL LOW (ref 12.0–15.0)
Immature Granulocytes: 0 %
Lymphocytes Relative: 25 %
Lymphs Abs: 2 10*3/uL (ref 0.7–4.0)
MCH: 31.3 pg (ref 26.0–34.0)
MCHC: 32.2 g/dL (ref 30.0–36.0)
MCV: 97.2 fL (ref 80.0–100.0)
Monocytes Absolute: 0.8 10*3/uL (ref 0.1–1.0)
Monocytes Relative: 10 %
Neutro Abs: 4.9 10*3/uL (ref 1.7–7.7)
Neutrophils Relative %: 61 %
Platelets: 150 10*3/uL (ref 150–400)
RBC: 3.52 MIL/uL — ABNORMAL LOW (ref 3.87–5.11)
RDW: 15.5 % (ref 11.5–15.5)
WBC: 7.9 10*3/uL (ref 4.0–10.5)
nRBC: 0 % (ref 0.0–0.2)

## 2021-03-17 LAB — VITAMIN B12: Vitamin B-12: 425 pg/mL (ref 180–914)

## 2021-03-17 LAB — TROPONIN I (HIGH SENSITIVITY)
Troponin I (High Sensitivity): 713 ng/L (ref <18)
Troponin I (High Sensitivity): 717 ng/L (ref <18)
Troponin I (High Sensitivity): 299 ng/L (ref <18)
Troponin I (High Sensitivity): 278 ng/L (ref <18)

## 2021-03-17 LAB — IRON AND TIBC
Iron: 120 ug/dL (ref 28–170)
Saturation Ratios: 43 % — ABNORMAL HIGH (ref 10.4–31.8)
TIBC: 281 ug/dL (ref 250–450)
UIBC: 161 ug/dL

## 2021-03-17 LAB — COMPREHENSIVE METABOLIC PANEL
ALT: 26 U/L (ref 0–44)
AST: 28 U/L (ref 15–41)
Albumin: 3.7 g/dL (ref 3.5–5.0)
Alkaline Phosphatase: 255 U/L — ABNORMAL HIGH (ref 38–126)
Anion gap: 11 (ref 5–15)
BUN: 26 mg/dL — ABNORMAL HIGH (ref 8–23)
CO2: 30 mmol/L (ref 22–32)
Calcium: 9.9 mg/dL (ref 8.9–10.3)
Chloride: 95 mmol/L — ABNORMAL LOW (ref 98–111)
Creatinine, Ser: 6.34 mg/dL — ABNORMAL HIGH (ref 0.44–1.00)
GFR, Estimated: 7 mL/min — ABNORMAL LOW (ref >60)
Glucose, Bld: 113 mg/dL — ABNORMAL HIGH (ref 70–99)
Potassium: 4.2 mmol/L (ref 3.5–5.1)
Sodium: 136 mmol/L (ref 135–145)
Total Bilirubin: 0.7 mg/dL (ref 0.3–1.2)
Total Protein: 6.8 g/dL (ref 6.5–8.1)

## 2021-03-17 LAB — FOLATE: Folate: 21.2 ng/mL (ref >5.9)

## 2021-03-17 LAB — FERRITIN: Ferritin: 1666 ng/mL — ABNORMAL HIGH (ref 11–307)

## 2021-03-17 LAB — RETICULOCYTES
Immature Retic Fract: 5.5 % (ref 2.3–15.9)
RBC.: 3.53 MIL/uL — ABNORMAL LOW (ref 3.87–5.11)
Retic Count, Absolute: 29.8 10*3/uL (ref 19.0–186.0)
Retic Ct Pct: 0.8 % (ref 0.4–3.1)

## 2021-03-17 LAB — SARS CORONAVIRUS 2 (TAT 6-24 HRS): SARS Coronavirus 2: NEGATIVE

## 2021-03-17 MED ORDER — SALINE SPRAY 0.65 % NA SOLN
1.0000 | NASAL | Status: DC | PRN
  Administered 2021-03-17: 1 via NASAL
  Filled 2021-03-17: qty 44

## 2021-03-17 MED ORDER — LORATADINE 10 MG PO TABS
10.0000 mg | ORAL_TABLET | Freq: Every day | ORAL | Status: DC
  Administered 2021-03-17 – 2021-03-18 (×2): 10 mg via ORAL
  Filled 2021-03-17 (×2): qty 1

## 2021-03-17 MED ORDER — ATORVASTATIN CALCIUM 40 MG PO TABS
40.0000 mg | ORAL_TABLET | Freq: Every day | ORAL | Status: DC
  Administered 2021-03-17 – 2021-03-18 (×2): 40 mg via ORAL
  Filled 2021-03-17 (×2): qty 1

## 2021-03-17 MED ORDER — AMIODARONE HCL 200 MG PO TABS
400.0000 mg | ORAL_TABLET | Freq: Two times a day (BID) | ORAL | Status: DC
  Administered 2021-03-17 (×2): 400 mg via ORAL
  Filled 2021-03-17 (×2): qty 2

## 2021-03-17 MED ORDER — FLUTICASONE PROPIONATE 50 MCG/ACT NA SUSP
2.0000 | Freq: Every day | NASAL | Status: DC
  Administered 2021-03-18: 2 via NASAL
  Filled 2021-03-17: qty 16

## 2021-03-17 NOTE — Progress Notes (Signed)
PROGRESS NOTE    Brenda Arnold  [id number] DOB: March 21, 1956 DOA: 03/16/2021 PCP: Kelton Pillar, MD     Brief Narrative:  65 year old BF PMHx Asthma, DM type II controlled with complication (not on medication), DM nephropathy, dyslipidemia, ESRD on HD M/W/F, paroxysmal atrial fibrillation on Eliquis previously on amiodarone, pericarditis with pericardial effusion s/p perricardial window on 08/31/2019, DVT, PCOS, OSA,   Presented on 4/15 with worsening chest pain and associated shortness of breath started this morning after completing her dialysis session.  At baseline she uses midodrine for hypotension particularly worsened with dialysis that typically requires an extra dose.  Patient reports immediately after completing dialysis having worsening chest pain in the center of her chest that she described as "heartburn" that felt similar to a prior episode of pericarditis in 2020.   She drove herself to a local urgent care center (as her PCPs office was closed due to the holiday) and was found to have severe chest pain with atrial fibrillation with RVR and was transferred to [facility] by EMS.  In the ED patient was found to have a heart rate in the 170s with EKG confirming A. fib and worsening blood pressure from 126/106 to 56/34 prompting emergent cardioversion by the ED physician with 3 unsuccessful attempts.  Patient then went into ventricular tachycardia requiring defibrillation and reported conversion to sinus rhythm.  Cardiology evaluated patient in the ED and started amiodarone drip to maintain sinus rhythm.  Blood pressure slowly improved to 80/52 with maps of 57 (baseline SBP's in the 90s).  Patient reported no further symptoms.   [facility] service was consulted to assist in management.  Regarding her paroxysmal atrial fibrillation her amiodarone was stopped on 01/2020 as she was maintaining normal sinus rhythm but then restarted when seen in A. fib clinic on 05/2020  last evaluation on 01/2021 by her cardiologist she had that refilled it for the past 3 months and did not want to go back on it after discussing with her cardiologist.  She was prescribed a 2-week Zio patch to assess for arrhythmia burden but did not apply since that visit   Subjective: Afebrile overnight, A/O x4, negative SOB, negative CP, negative nausea, negative vomiting.  States she has been taking her home medication appropriately.  Had been told at the A. fib clinic to stop her rate control medication, but continued her Eliquis.   Assessment & Plan: Covid vaccination; vaccinated 3/3   Active Problems:   Hypotension   Atrial fibrillation with RVR (HCC)   Positive D dimer   Elevated troponin  A. fib with RVR/Hypotension - Per EMR noteSuspect RVR exacerbated by poor adherence to amiodarone has been off for greater than 3 months, she did not refill for the first 3 months, did not comply Zio patch prescribed on 02/20/2021 by her cardiologist - HOWEVER per patient she was instructed to discontinue amiodarone at the A. fib clinic. - Currently finishing her IV amiodarone. - 4/16 currently NSR - 4/16 we will start Amiodarone PO 400 mg BID per cardiology recommendation - Continue Eliquis -Hypotension resolved  Acute on Chronic Hypotension - Per EMR SBP in the 50s in the setting of A. fib with RVR. -Midodrine 10 mg TID - Monitor closely  Elevated troponin/Demand ischemia -In light of A. fib with RVR and hemodynamic instability with cardioversion x3 and defibrillation, most consistent with demand ischemia. - Trend troponin  Elevated D-dimer - 4/15 D-dimer= 1.13 - This was obtained in setting of SOB and chest pain. -  In addition patient with ESRD - Currently patient comfortable negative SOB, negative CP - Echocardiogram negative for right heart strain which is reassuring for negative PE - 4/16 bilateral lower extremity Doppler pending  ESRD on HD, M/W/F  -Expect that patient will  be discharged prior to Monday however if required will provide HD prior to discharge on Monday.  Hx pericarditis (s/p pericardial window 08/2019) - Echocardiogram negative for pericardial effusion or signs of cardiac tamponade - Continue colchicine with HD  Peripheral neuropathy, stable --home gabapentin  DM type II controlled with complication/DM nephropathy,  -Last A1c 4.7 in December 2021 - 4/16 hemoglobin A1c pending - Not on any medications  HLD  - Lipid panel pending - 4/16 Lipitor 40 mg daily  Anemia unspecified - Anemia panel pending   Allergies - Patient having congestion with sneezing Flonase   Morbidly obese (BMI 37.58 kg/m)  - Patient meets criteria for bariatric surgery.  Should discuss with PCP as outpatient     DVT prophylaxis: Eliquis Code Status: Full Family Communication:  Status is: Inpatient    Dispo: The patient is from: Home              Anticipated d/c is to: Home              Anticipated d/c date is: 4/18              Patient currently unstable      Consultants:    Procedures/Significant Events:  4/15 PCXR:No acute cardiopulmonary disease. 4/15 echocardiogram:Left Ventricle: LVEF= 70 to 75%. The left ventricle has hyperdynamic function. -Moderate concentric LVH  4/16 bilateral lower extremity Doppler: Findings pending  I have personally reviewed and interpreted all radiology studies and my findings are as above.  VENTILATOR SETTINGS:    Cultures 4/16 SARS coronavirus negative    Antimicrobials:    Devices    LINES / TUBES:      Continuous Infusions: . amiodarone 30 mg/hr (03/17/21 0159)     Objective: Vitals:   03/16/21 1900 03/16/21 2300 03/17/21 0339 03/17/21 0409  BP: (!) 86/59 108/60 (!) 98/58   Pulse: 74 79 66   Resp: '17 20 15   '$ Temp: 97.7 F (36.5 C) 97.9 F (36.6 C) 97.9 F (36.6 C)   TempSrc: Oral Oral Oral   SpO2: 98% 98% 100%   Weight:    105.6 kg    Intake/Output Summary (Last 24  hours) at 03/17/2021 [id number] Last data filed at 03/17/2021 0159 Gross per 24 hour  Intake 621.56 ml  Output --  Net 621.56 ml   Filed Weights   03/17/21 0409  Weight: 105.6 kg    Examination:  General: A/O x4, No acute respiratory distress Eyes: negative scleral hemorrhage, negative anisocoria, negative icterus ENT: Negative Runny nose, negative gingival bleeding, Neck:  Negative scars, masses, torticollis, lymphadenopathy, JVD Lungs: Clear to auscultation bilaterally without wheezes or crackles Cardiovascular: Regular rate and rhythm without murmur gallop or rub normal S1 and S2 Abdomen: OBESE, negative abdominal pain, nondistended, positive soft, bowel sounds, no rebound, no ascites, no appreciable mass Extremities: No significant cyanosis, clubbing, or edema bilateral lower extremities, fistula on left wrist Skin: Negative rashes, lesions, ulcers Psychiatric:  Negative depression, negative anxiety, negative fatigue, negative mania  Central nervous system:  Cranial nerves II through XII intact, tongue/uvula midline, all extremities muscle strength 5/5, sensation intact throughout, negative dysarthria, negative expressive aphasia, negative receptive aphasia.  .     Data Reviewed: Care during the described time interval  was provided by me .  I have reviewed this patient's available data, including medical history, events of note, physical examination, and all test results as part of my evaluation.  CBC: Recent Labs  Lab 03/16/21 1704 03/17/21 0011  WBC 8.4 7.9  NEUTROABS  --  4.9  HGB 10.6* 11.0*  HCT 33.6* 34.2*  MCV 97.1 97.2  PLT 121* [id number]   Basic Metabolic Panel: Recent Labs  Lab 03/16/21 1155 03/17/21 0011  NA 138 136  K 3.4* 4.2  CL 96* 95*  CO2 25 30  GLUCOSE 195* 113*  BUN 16 26*  CREATININE 5.07* 6.34*  CALCIUM 9.8 9.9   GFR: Estimated Creatinine Clearance: 11.2 mL/min (A) (by C-G formula based on SCr of 6.34 mg/dL (H)). Liver Function Tests: Recent  Labs  Lab 03/16/21 1155 03/17/21 0011  AST 31 28  ALT 26 26  ALKPHOS 260* 255*  BILITOT 0.7 0.7  PROT 6.8 6.8  ALBUMIN 3.6 3.7   No results for input(s): LIPASE, AMYLASE in the last 168 hours. No results for input(s): AMMONIA in the last 168 hours. Coagulation Profile: No results for input(s): INR, PROTIME in the last 168 hours. Cardiac Enzymes: No results for input(s): CKTOTAL, CKMB, CKMBINDEX, TROPONINI in the last 168 hours. BNP (last 3 results) No results for input(s): PROBNP in the last 8760 hours. HbA1C: No results for input(s): HGBA1C in the last 72 hours. CBG: Recent Labs  Lab 03/16/21 1147  GLUCAP 164*   Lipid Profile: No results for input(s): CHOL, HDL, LDLCALC, TRIG, CHOLHDL, LDLDIRECT in the last 72 hours. Thyroid Function Tests: Recent Labs    03/16/21 1704  TSH 1.471   Anemia Panel: No results for input(s): VITAMINB12, FOLATE, FERRITIN, TIBC, IRON, RETICCTPCT in the last 72 hours. Sepsis Labs: No results for input(s): PROCALCITON, LATICACIDVEN in the last 168 hours.  No results found for this or any previous visit (from the past 240 hour(s)).       Radiology Studies: DG Chest Port 1 View  Result Date: 03/16/2021 CLINICAL DATA:  Pt with past medical history of ESRD on dialysis, paroxysmal atrial fibrillation, uncontrolled diabetes, pericarditis presenting for acute onset this morning of midsternal chest pain rated 7 out of 10 with associated shortness of breath. EXAM: PORTABLE CHEST 1 VIEW COMPARISON:  11/21/2020 and older exams. FINDINGS: Cardiac silhouette normal in size and configuration. No mediastinal or hilar masses. Linear opacities noted in both lung bases consistent with chronic bronchial wall thickening and atelectasis, similar to prior studies. Remainder of the lungs is clear. No convincing pleural effusion and no pneumothorax. Skeletal structures are grossly intact. IMPRESSION: No acute cardiopulmonary disease. Electronically Signed   By:  Lajean Manes M.D.   On: 03/16/2021 12:32   ECHOCARDIOGRAM COMPLETE  Result Date: 03/16/2021    ECHOCARDIOGRAM REPORT   Patient Name:   MIMMA MEER Sowles Date of Exam: 03/16/2021 Medical Rec #:  [id number]          Height:       66.0 in Accession #:    [id number]         Weight:       234.0 lb Date of Birth:  1956-06-05          BSA:          2.138 m Patient Age:    53 years           BP:           84/54 mmHg Patient Gender: F  HR:           93 bpm. Exam Location:  Inpatient Procedure: 2D Echo, Cardiac Doppler and Color Doppler                           STAT ECHO Reported to: Dr Buford Dresser on 03/16/2021 1:55:00 PM       Dr. Darcel Bayley bedside to review images. Indications:    Ventricular tachycardia  History:        Patient has prior history of Echocardiogram examinations, most                 recent 02/04/2020. Arrythmias:Atrial Fibrillation; Risk                 Factors:Hypertension, Dyslipidemia and Diabetes. ESRD.  Sonographer:    Clayton Lefort RDCS (AE) Referring Phys: [id number] Donato Heinz  Sonographer Comments: Patient is morbidly obese. IMPRESSIONS  1. Left ventricular ejection fraction, by estimation, is 70 to 75%. The left ventricle has hyperdynamic function. The left ventricle has no regional wall motion abnormalities. There is moderate concentric left ventricular hypertrophy. Left ventricular diastolic parameters were normal.  2. Right ventricular systolic function is normal. The right ventricular size is normal. There is normal pulmonary artery systolic pressure.  3. The mitral valve is grossly normal. Trivial mitral valve regurgitation. No evidence of mitral stenosis.  4. The aortic valve is tricuspid. There is moderate calcification of the aortic valve. There is mild thickening of the aortic valve. Aortic valve regurgitation is not visualized. Mild to moderate aortic valve sclerosis/calcification is present, without any evidence of aortic stenosis.  5. The  inferior vena cava is normal in size with <50% respiratory variability, suggesting right atrial pressure of 8 mmHg. Comparison(s): No significant change from prior study. Conclusion(s)/Recommendation(s): Otherwise normal echocardiogram, with minor abnormalities described in the report. FINDINGS  Left Ventricle: Left ventricular ejection fraction, by estimation, is 70 to 75%. The left ventricle has hyperdynamic function. The left ventricle has no regional wall motion abnormalities. The left ventricular internal cavity size was normal in size. There is moderate concentric left ventricular hypertrophy. Left ventricular diastolic parameters were normal. Right Ventricle: The right ventricular size is normal. Right vetricular wall thickness was not well visualized. Right ventricular systolic function is normal. There is normal pulmonary artery systolic pressure. The tricuspid regurgitant velocity is 2.16 m/s, and with an assumed right atrial pressure of 8 mmHg, the estimated right ventricular systolic pressure is Q000111Q mmHg. Left Atrium: Left atrial size was normal in size. Right Atrium: Right atrial size was normal in size. Pericardium: There is no evidence of pericardial effusion. Mitral Valve: The mitral valve is grossly normal. Trivial mitral valve regurgitation. No evidence of mitral valve stenosis. Tricuspid Valve: The tricuspid valve is normal in structure. Tricuspid valve regurgitation is trivial. Aortic Valve: The aortic valve is tricuspid. There is moderate calcification of the aortic valve. There is mild thickening of the aortic valve. Aortic valve regurgitation is not visualized. Mild to moderate aortic valve sclerosis/calcification is present, without any evidence of aortic stenosis. Aortic valve mean gradient measures 4.0 mmHg. Aortic valve peak gradient measures 6.8 mmHg. Aortic valve area, by VTI measures 2.12 cm. Pulmonic Valve: The pulmonic valve was not well visualized. Pulmonic valve regurgitation is  not visualized. No evidence of pulmonic stenosis. Aorta: The aortic root and ascending aorta are structurally normal, with no evidence of dilitation. Venous: The inferior vena cava is normal in size with  less than 50% respiratory variability, suggesting right atrial pressure of 8 mmHg. IAS/Shunts: The atrial septum is grossly normal.  LEFT VENTRICLE PLAX 2D LVIDd:         3.20 cm LVIDs:         1.90 cm LV PW:         1.60 cm LV IVS:        1.50 cm LVOT diam:     1.80 cm LV SV:         51 LV SV Index:   24 LVOT Area:     2.54 cm  RIGHT VENTRICLE             IVC RV Basal diam:  2.90 cm     IVC diam: 1.95 cm RV S prime:     11.50 cm/s TAPSE (M-mode): 2.0 cm LEFT ATRIUM             Index       RIGHT ATRIUM           Index LA diam:        2.70 cm 1.26 cm/m  RA Area:     15.60 cm LA Vol (A2C):   30.6 ml 14.31 ml/m RA Volume:   38.60 ml  18.06 ml/m LA Vol (A4C):   23.5 ml 10.99 ml/m LA Biplane Vol: 28.4 ml 13.28 ml/m  AORTIC VALVE AV Area (Vmax):    1.92 cm AV Area (Vmean):   2.06 cm AV Area (VTI):     2.12 cm AV Vmax:           130.00 cm/s AV Vmean:          96.200 cm/s AV VTI:            0.242 m AV Peak Grad:      6.8 mmHg AV Mean Grad:      4.0 mmHg LVOT Vmax:         97.90 cm/s LVOT Vmean:        77.900 cm/s LVOT VTI:          0.202 m LVOT/AV VTI ratio: 0.83  AORTA Ao Root diam: 2.90 cm Ao Asc diam:  3.10 cm TRICUSPID VALVE TR Peak grad:   18.7 mmHg TR Vmax:        216.00 cm/s  SHUNTS Systemic VTI:  0.20 m Systemic Diam: 1.80 cm Buford Dresser MD Electronically signed by Buford Dresser MD Signature Date/Time: 03/16/2021/2:47:08 PM    Final         Scheduled Meds: . apixaban  5 mg Oral BID  . [START ON 03/19/2021] calcitRIOL  0.75 mcg Oral Q M,W,F-HD  . colchicine  0.3 mg Oral Q M,W,F  . gabapentin  100 mg Oral QHS  . midodrine  10 mg Oral TID WC  . sucroferric oxyhydroxide  1,000 mg Oral TID WC   Continuous Infusions: . amiodarone 30 mg/hr (03/17/21 0159)     LOS: 0 days     Time spent:40 min    Coty Larsh, Geraldo Docker, MD [facility]   If 7PM-7AM, please contact night-coverage 03/17/2021, 8:11 AM

## 2021-03-17 NOTE — Plan of Care (Signed)

## 2021-03-17 NOTE — Progress Notes (Signed)
Overnight floor coverage progress note  Troponin trending up 51 >294 >399 >713.  Patient initially endorsed chest pain and shortness of breath at the time of admission in setting of A. fib with RVR for which she underwent cardioversion x3 and defibrillation.  With resolution of A. fib, patient no longer with chest pain or shortness of breath.  She has remained asymptomatic/chest pain-free overnight.  Blood pressure has improved with control of rhythm.  Troponin elevation likely due to demand ischemia in the setting of A. fib with RVR and hemodynamic instability requiring cardioversion x3 and defibrillation.  Discussed with on-call cardiologist Dr. Alfred Levins.  Will continue to trend troponin. PE is also on the differential given elevated D-dimer on admission.  However, patient is not hypoxic, satting 98-100% on room air and not tachypneic.  Currently in sinus rhythm.  Additionally, she had normal RV function on echo done on admission.  She is on Eliquis and lower extremity Dopplers were ordered on admission to rule out DVT.

## 2021-03-17 NOTE — Progress Notes (Signed)
VASCULAR LAB    Bilateral lower extremity venous duplex has been performed.  See CV proc for preliminary results.   Shakerra Red, RVT 03/17/2021, 5:45 PM

## 2021-03-17 NOTE — Progress Notes (Signed)
Progress Note  Patient Name: Brenda Arnold Date of Encounter: 03/17/2021  [facility] Cardiologist: Fransico Him, MD   Subjective   Patient feels good   Denies CP  No SOB   Eager to go home "My birthday is tomorrow" Michela Pitcher she was feeling good yesterday   Fiinished dialysis    Stood up.  Felt a little off   Went to car  A little indigestion   Went to urgent care since PCP closed  Then ED REmembers 2 of 4 shocks   Inpatient Medications    Scheduled Meds: . apixaban  5 mg Oral BID  . [START ON 03/19/2021] calcitRIOL  0.75 mcg Oral Q M,W,F-HD  . colchicine  0.3 mg Oral Q M,W,F  . gabapentin  100 mg Oral QHS  . midodrine  10 mg Oral TID WC  . sucroferric oxyhydroxide  1,000 mg Oral TID WC   Continuous Infusions: . amiodarone 30 mg/hr (03/17/21 0159)   PRN Meds: bisacodyl, diphenhydrAMINE, docusate sodium, methocarbamol, ondansetron **OR** ondansetron (ZOFRAN) IV, oxyCODONE   Vital Signs    Vitals:   03/16/21 1900 03/16/21 2300 03/17/21 0339 03/17/21 0409  BP: (!) 86/59 108/60 (!) 98/58   Pulse: 74 79 66   Resp: '17 20 15   '$ Temp: 97.7 F (36.5 C) 97.9 F (36.6 C) 97.9 F (36.6 C)   TempSrc: Oral Oral Oral   SpO2: 98% 98% 100%   Weight:    105.6 kg    Intake/Output Summary (Last 24 hours) at 03/17/2021 0829 Last data filed at 03/17/2021 0159 Gross per 24 hour  Intake 621.56 ml  Output --  Net 621.56 ml   Last 3 Weights 03/17/2021 02/21/2021 11/29/2020  Weight (lbs) 232 lb 12.9 oz 234 lb 236 lb 1.8 oz  Weight (kg) 105.6 kg 106.142 kg 107.1 kg      Telemetry    SR 60s   - Personally Reviewed  ECG    No new EKGs  - Personally Reviewed  Physical Exam   GEN: No acute distress.   Neck: No JVD Cardiac: RRR, no murmurs, rubs, or gallops.  Respiratory: Clear to auscultation bilaterally. GI: Soft, nontender, non-distended  MS: No edema; No deformity. Neuro:  Nonfocal  Psych: Normal affect   Labs    High Sensitivity Troponin:   Recent Labs  Lab  03/16/21 1409 03/16/21 1704 03/16/21 1836 03/17/21 0011 03/17/21 0550  TROPONINIHS 51* 294* 399* 713* 717*      Chemistry Recent Labs  Lab 03/16/21 1155 03/17/21 0011  NA 138 136  K 3.4* 4.2  CL 96* 95*  CO2 25 30  GLUCOSE 195* 113*  BUN 16 26*  CREATININE 5.07* 6.34*  CALCIUM 9.8 9.9  PROT 6.8 6.8  ALBUMIN 3.6 3.7  AST 31 28  ALT 26 26  ALKPHOS 260* 255*  BILITOT 0.7 0.7  GFRNONAA 9* 7*  ANIONGAP 17* 11     Hematology Recent Labs  Lab 03/16/21 1704 03/17/21 0011  WBC 8.4 7.9  RBC 3.46* 3.52*  HGB 10.6* 11.0*  HCT 33.6* 34.2*  MCV 97.1 97.2  MCH 30.6 31.3  MCHC 31.5 32.2  RDW 15.4 15.5  PLT 121* 150    BNPNo results for input(s): BNP, PROBNP in the last 168 hours.   DDimer  Recent Labs  Lab 03/16/21 1155  DDIMER 1.13*     Radiology    DG Chest Port 1 View  Result Date: 03/16/2021 CLINICAL DATA:  Pt with past medical history of ESRD  on dialysis, paroxysmal atrial fibrillation, uncontrolled diabetes, pericarditis presenting for acute onset this morning of midsternal chest pain rated 7 out of 10 with associated shortness of breath. EXAM: PORTABLE CHEST 1 VIEW COMPARISON:  11/21/2020 and older exams. FINDINGS: Cardiac silhouette normal in size and configuration. No mediastinal or hilar masses. Linear opacities noted in both lung bases consistent with chronic bronchial wall thickening and atelectasis, similar to prior studies. Remainder of the lungs is clear. No convincing pleural effusion and no pneumothorax. Skeletal structures are grossly intact. IMPRESSION: No acute cardiopulmonary disease. Electronically Signed   By: Lajean Manes M.D.   On: 03/16/2021 12:32   ECHOCARDIOGRAM COMPLETE  Result Date: 03/16/2021    ECHOCARDIOGRAM REPORT   Patient Name:   Brenda Arnold Date of Exam: 03/16/2021 Medical Rec #:  [id number]          Height:       66.0 in Accession #:    [id number]         Weight:       234.0 lb Date of Birth:  1956/05/20          BSA:           2.138 m Patient Age:    65 years           BP:           84/54 mmHg Patient Gender: F                  HR:           93 bpm. Exam Location:  Inpatient Procedure: 2D Echo, Cardiac Doppler and Color Doppler                           STAT ECHO Reported to: Dr Buford Dresser on 03/16/2021 1:55:00 PM       Dr. Darcel Bayley bedside to review images. Indications:    Ventricular tachycardia  History:        Patient has prior history of Echocardiogram examinations, most                 recent 02/04/2020. Arrythmias:Atrial Fibrillation; Risk                 Factors:Hypertension, Dyslipidemia and Diabetes. ESRD.  Sonographer:    Clayton Lefort RDCS (AE) Referring Phys: [id number] Donato Heinz  Sonographer Comments: Patient is morbidly obese. IMPRESSIONS  1. Left ventricular ejection fraction, by estimation, is 70 to 75%. The left ventricle has hyperdynamic function. The left ventricle has no regional wall motion abnormalities. There is moderate concentric left ventricular hypertrophy. Left ventricular diastolic parameters were normal.  2. Right ventricular systolic function is normal. The right ventricular size is normal. There is normal pulmonary artery systolic pressure.  3. The mitral valve is grossly normal. Trivial mitral valve regurgitation. No evidence of mitral stenosis.  4. The aortic valve is tricuspid. There is moderate calcification of the aortic valve. There is mild thickening of the aortic valve. Aortic valve regurgitation is not visualized. Mild to moderate aortic valve sclerosis/calcification is present, without any evidence of aortic stenosis.  5. The inferior vena cava is normal in size with <50% respiratory variability, suggesting right atrial pressure of 8 mmHg. Comparison(s): No significant change from prior study. Conclusion(s)/Recommendation(s): Otherwise normal echocardiogram, with minor abnormalities described in the report. FINDINGS  Left Ventricle: Left ventricular ejection fraction,  by estimation, is 70 to 75%. The left ventricle has hyperdynamic  function. The left ventricle has no regional wall motion abnormalities. The left ventricular internal cavity size was normal in size. There is moderate concentric left ventricular hypertrophy. Left ventricular diastolic parameters were normal. Right Ventricle: The right ventricular size is normal. Right vetricular wall thickness was not well visualized. Right ventricular systolic function is normal. There is normal pulmonary artery systolic pressure. The tricuspid regurgitant velocity is 2.16 m/s, and with an assumed right atrial pressure of 8 mmHg, the estimated right ventricular systolic pressure is [id number] mmHg. Left Atrium: Left atrial size was normal in size. Right Atrium: Right atrial size was normal in size. Pericardium: There is no evidence of pericardial effusion. Mitral Valve: The mitral valve is grossly normal. Trivial mitral valve regurgitation. No evidence of mitral valve stenosis. Tricuspid Valve: The tricuspid valve is normal in structure. Tricuspid valve regurgitation is trivial. Aortic Valve: The aortic valve is tricuspid. There is moderate calcification of the aortic valve. There is mild thickening of the aortic valve. Aortic valve regurgitation is not visualized. Mild to moderate aortic valve sclerosis/calcification is present, without any evidence of aortic stenosis. Aortic valve mean gradient measures 4.0 mmHg. Aortic valve peak gradient measures 6.8 mmHg. Aortic valve area, by VTI measures 2.12 cm. Pulmonic Valve: The pulmonic valve was not well visualized. Pulmonic valve regurgitation is not visualized. No evidence of pulmonic stenosis. Aorta: The aortic root and ascending aorta are structurally normal, with no evidence of dilitation. Venous: The inferior vena cava is normal in size with less than 50% respiratory variability, suggesting right atrial pressure of 8 mmHg. IAS/Shunts: The atrial septum is grossly normal.  LEFT  VENTRICLE PLAX 2D LVIDd:         3.20 cm LVIDs:         1.90 cm LV PW:         1.60 cm LV IVS:        1.50 cm LVOT diam:     1.80 cm LV SV:         51 LV SV Index:   24 LVOT Area:     2.54 cm  RIGHT VENTRICLE             IVC RV Basal diam:  2.90 cm     IVC diam: 1.95 cm RV S prime:     11.50 cm/s TAPSE (M-mode): 2.0 cm LEFT ATRIUM             Index       RIGHT ATRIUM           Index LA diam:        2.70 cm 1.26 cm/m  RA Area:     15.60 cm LA Vol (A2C):   30.6 ml 14.31 ml/m RA Volume:   38.60 ml  18.06 ml/m LA Vol (A4C):   23.5 ml 10.99 ml/m LA Biplane Vol: 28.4 ml 13.28 ml/m  AORTIC VALVE AV Area (Vmax):    1.92 cm AV Area (Vmean):   2.06 cm AV Area (VTI):     2.12 cm AV Vmax:           130.00 cm/s AV Vmean:          96.200 cm/s AV VTI:            0.242 m AV Peak Grad:      6.8 mmHg AV Mean Grad:      4.0 mmHg LVOT Vmax:         97.90 cm/s LVOT Vmean:  77.900 cm/s LVOT VTI:          0.202 m LVOT/AV VTI ratio: 0.83  AORTA Ao Root diam: 2.90 cm Ao Asc diam:  3.10 cm TRICUSPID VALVE TR Peak grad:   18.7 mmHg TR Vmax:        216.00 cm/s  SHUNTS Systemic VTI:  0.20 m Systemic Diam: 1.80 cm Buford Dresser MD Electronically signed by Buford Dresser MD Signature Date/Time: 03/16/2021/2:47:08 PM    Final     Cardiac Studies    Echo 03/16/21   1. Left ventricular ejection fraction, by estimation, is 70 to 75%. The  left ventricle has hyperdynamic function. The left ventricle has no  regional wall motion abnormalities. There is moderate concentric left  ventricular hypertrophy. Left ventricular  diastolic parameters were normal.  2. Right ventricular systolic function is normal. The right ventricular  size is normal. There is normal pulmonary artery systolic pressure.  3. The mitral valve is grossly normal. Trivial mitral valve  regurgitation. No evidence of mitral stenosis.  4. The aortic valve is tricuspid. There is moderate calcification of the  aortic valve. There is mild  thickening of the aortic valve. Aortic valve  regurgitation is not visualized. Mild to moderate aortic valve  sclerosis/calcification is present, without  any evidence of aortic stenosis.  5. The inferior vena cava is normal in size with <50% respiratory  variability, suggesting right atrial pressure of 8 mmHg.   Comparison(s): No significant change from prior study.   Echo: 02/13/21  1. Left ventricular ejection fraction, by estimation, is 70 to 75%. The  left ventricle has hyperdynamic function. The left ventricle has no  regional wall motion abnormalities. There is moderate concentric left  ventricular hypertrophy. Left ventricular  diastolic parameters were normal.  2. Right ventricular systolic function is normal. The right ventricular  size is normal. There is normal pulmonary artery systolic pressure.  3. The mitral valve is grossly normal. Trivial mitral valve  regurgitation. No evidence of mitral stenosis.  4. The aortic valve is tricuspid. There is moderate calcification of the  aortic valve. There is mild thickening of the aortic valve. Aortic valve  regurgitation is not visualized. Mild to moderate aortic valve  sclerosis/calcification is present, without  any evidence of aortic stenosis.  5. The inferior vena cava is normal in size with <50% respiratory  variability, suggesting right atrial pressure of 8 mmHg.   Comparison(s): No significant change from prior study.   MYOVIEW  04/17/17    Nuclear stress EF: 68%.  There was no ST segment deviation noted during stress.  The study is normal.  This is a low risk study.  The left ventricular ejection fraction is hyperdynamic (>65%).   Normal pharmacologic nuclear study with no evidence of prior infarct or ischemia.   Patient Profile      Brenda Arnold is a 65 y.o. female with a hx of ESRD, paroxysmal atrial fibrillation on Eliquis, cardiac tamponade in 2020 status post pericardial window who is being  seen today for the evaluation of Afib with RVR at the request of Dr Joya Gaskins.  Assessment & Plan    1  Atrial fibrillation  Patient with a hx of PAF Had been on amiodarone in past.  Stoppped in Feb 2021 REcurrened in June then went back into SR  AMiodarone started back   Ran out of pills in late 2021.Pt did not want to restart.  Seen in late March 2022 by Ashok Norris  IN SR at  the time  With fluttering Zio patch ordered     Now with recurrent Afib with RVR   (I do not think there was any VT) She was Defib back to SR     On amiodarone now     Has been getting IV   I would move to 400 PO bid  Give 2 doses today    Continue tele   Ambulate  Continue Eliquis   2  Elevated troponin  Pt does have CAD on CT scan done in 2020  (multivessel)  Myovue normal in 2018  Pt doing ok until right after dialysis   Trop elevation may be due to hypotension, afib with RVR and defibrilation in setting of ESRD/dialysis   Echo with hyperdynamic LV No wall motion changes If feels OK I would not pursue ischemic work up at this point  WIll continue to follow  Ambulate some and follow   3  Hx pericardial effusion  Pt is s/p window   Echo shows no effusion  4  Hx hypotension  COntinue midodrine  Follow BP  S  5  ESRD  On dialysis     Has M/W/F  4  HL   REfused statin Rx  Will review with her again  5  OSA  For questions or updates, please contact [facility] Please consult [email or web address] for contact info under        Signed, Dorris Carnes, MD  03/17/2021, 8:29 AM

## 2021-03-18 DIAGNOSIS — I25119 Atherosclerotic heart disease of native coronary artery with unspecified angina pectoris: Secondary | ICD-10-CM

## 2021-03-18 DIAGNOSIS — E785 Hyperlipidemia, unspecified: Secondary | ICD-10-CM | POA: Diagnosis not present

## 2021-03-18 DIAGNOSIS — I4811 Longstanding persistent atrial fibrillation: Secondary | ICD-10-CM | POA: Diagnosis not present

## 2021-03-18 DIAGNOSIS — R7989 Other specified abnormal findings of blood chemistry: Secondary | ICD-10-CM | POA: Diagnosis not present

## 2021-03-18 DIAGNOSIS — R778 Other specified abnormalities of plasma proteins: Secondary | ICD-10-CM | POA: Diagnosis not present

## 2021-03-18 DIAGNOSIS — I4891 Unspecified atrial fibrillation: Secondary | ICD-10-CM | POA: Diagnosis not present

## 2021-03-18 LAB — PHOSPHORUS: Phosphorus: 4.4 mg/dL (ref 2.5–4.6)

## 2021-03-18 LAB — CBC WITH DIFFERENTIAL/PLATELET
Abs Immature Granulocytes: 0.03 10*3/uL (ref 0.00–0.07)
Basophils Absolute: 0 10*3/uL (ref 0.0–0.1)
Basophils Relative: 1 %
Eosinophils Absolute: 0.4 10*3/uL (ref 0.0–0.5)
Eosinophils Relative: 4 %
HCT: 32 % — ABNORMAL LOW (ref 36.0–46.0)
Hemoglobin: 10.2 g/dL — ABNORMAL LOW (ref 12.0–15.0)
Immature Granulocytes: 0 %
Lymphocytes Relative: 25 %
Lymphs Abs: 2.1 10*3/uL (ref 0.7–4.0)
MCH: 30.5 pg (ref 26.0–34.0)
MCHC: 31.9 g/dL (ref 30.0–36.0)
MCV: 95.8 fL (ref 80.0–100.0)
Monocytes Absolute: 0.8 10*3/uL (ref 0.1–1.0)
Monocytes Relative: 10 %
Neutro Abs: 5.1 10*3/uL (ref 1.7–7.7)
Neutrophils Relative %: 60 %
Platelets: 158 10*3/uL (ref 150–400)
RBC: 3.34 MIL/uL — ABNORMAL LOW (ref 3.87–5.11)
RDW: 15.4 % (ref 11.5–15.5)
WBC: 8.5 10*3/uL (ref 4.0–10.5)
nRBC: 0 % (ref 0.0–0.2)

## 2021-03-18 LAB — COMPREHENSIVE METABOLIC PANEL
ALT: 22 U/L (ref 0–44)
AST: 23 U/L (ref 15–41)
Albumin: 3.4 g/dL — ABNORMAL LOW (ref 3.5–5.0)
Alkaline Phosphatase: 241 U/L — ABNORMAL HIGH (ref 38–126)
Anion gap: 13 (ref 5–15)
BUN: 47 mg/dL — ABNORMAL HIGH (ref 8–23)
CO2: 29 mmol/L (ref 22–32)
Calcium: 10.3 mg/dL (ref 8.9–10.3)
Chloride: 96 mmol/L — ABNORMAL LOW (ref 98–111)
Creatinine, Ser: 8.97 mg/dL — ABNORMAL HIGH (ref 0.44–1.00)
GFR, Estimated: 5 mL/min — ABNORMAL LOW (ref >60)
Glucose, Bld: 98 mg/dL (ref 70–99)
Potassium: 4.4 mmol/L (ref 3.5–5.1)
Sodium: 138 mmol/L (ref 135–145)
Total Bilirubin: 0.5 mg/dL (ref 0.3–1.2)
Total Protein: 6.3 g/dL — ABNORMAL LOW (ref 6.5–8.1)

## 2021-03-18 LAB — LIPID PANEL
Cholesterol: 164 mg/dL (ref 0–200)
HDL: 46 mg/dL (ref >40)
LDL Cholesterol: 103 mg/dL — ABNORMAL HIGH (ref 0–99)
Total CHOL/HDL Ratio: 3.6 RATIO
Triglycerides: 75 mg/dL (ref <150)
VLDL: 15 mg/dL (ref 0–40)

## 2021-03-18 LAB — TROPONIN I (HIGH SENSITIVITY): Troponin I (High Sensitivity): 245 ng/L (ref <18)

## 2021-03-18 LAB — MAGNESIUM: Magnesium: 2 mg/dL (ref 1.7–2.4)

## 2021-03-18 MED ORDER — OXYCODONE HCL 5 MG PO TABS: 5.0000 mg | ORAL_TABLET | Freq: Four times a day (QID) | ORAL | 0 refills | Status: DC | PRN

## 2021-03-18 MED ORDER — AMIODARONE HCL 200 MG PO TABS
200.0000 mg | ORAL_TABLET | Freq: Two times a day (BID) | ORAL | Status: DC
  Administered 2021-03-18: 200 mg via ORAL
  Filled 2021-03-18: qty 1

## 2021-03-18 MED ORDER — FLUTICASONE PROPIONATE 50 MCG/ACT NA SUSP: 2.0000 | Freq: Every day | NASAL | 0 refills | Status: DC

## 2021-03-18 MED ORDER — ATORVASTATIN CALCIUM 40 MG PO TABS: 40.0000 mg | ORAL_TABLET | Freq: Every day | ORAL | 0 refills | Status: DC

## 2021-03-18 MED ORDER — CALCITRIOL 0.25 MCG PO CAPS: 0.7500 ug | ORAL_CAPSULE | ORAL | 0 refills | Status: DC

## 2021-03-18 MED ORDER — COLCHICINE 0.6 MG PO TABS: 0.3000 mg | ORAL_TABLET | ORAL | 0 refills | Status: DC

## 2021-03-18 MED ORDER — LORATADINE 10 MG PO TABS: 10.0000 mg | ORAL_TABLET | Freq: Every day | ORAL | 0 refills | Status: AC

## 2021-03-18 MED ORDER — VELPHORO 500 MG PO CHEW: 1000.0000 mg | CHEWABLE_TABLET | Freq: Three times a day (TID) | ORAL | 0 refills | Status: AC

## 2021-03-18 MED ORDER — AMIODARONE HCL 200 MG PO TABS: 200.0000 mg | ORAL_TABLET | Freq: Two times a day (BID) | ORAL | 0 refills | Status: DC

## 2021-03-18 MED ORDER — METHOCARBAMOL 500 MG PO TABS: 500.0000 mg | ORAL_TABLET | Freq: Three times a day (TID) | ORAL | 0 refills | Status: DC | PRN

## 2021-03-18 NOTE — Progress Notes (Signed)
Discharge instructions given to patient, patient understands all follow up appointments and medication changes. 2 IV's removed without complication, site is clean dry and intact.

## 2021-03-18 NOTE — Discharge Summary (Addendum)
Physician Discharge Summary  [name] [id number] DOB: [birth year] DOA: 03/16/2021  PCP: Kelton Pillar, MD  Admit date: 03/16/2021 Discharge date: 03/19/2021  Time spent: 35 minutes  Recommendations for Outpatient Follow-up:   Covid vaccination; vaccinated 3/3   A. fib with RVR/Hypotension - Per EMR noteSuspect RVR exacerbated by poor adherence to amiodaronehas been off for greater than 3 months, she did not refill for the first 3 months, did not comply Zio patch prescribed on 02/20/2021 by her cardiologist - HOWEVER per patient she was instructed to discontinue amiodarone at the A. fib clinic. - Amiodarone PO  200 mg BID per cardiology recommendation -Continue Eliquis 5 mg BID -Hypotension resolved - Follow-up with Dr. Fransico Him cardiology on 4/27 A. fib with RVR, acute on chronic hypotension, per cardiology recommendation  Acute on Chronic Hypotension - Per EMR SBP in the 50s in the setting of A. fib with RVR. -Midodrine 10 mg TID   Elevated troponin/Demand ischemia -In light of A. fib with RVR and hemodynamic instability with cardioversion x3 and defibrillation, most consistent with demand ischemia. - Trend troponin Results for Brenda, Arnold (MRN [id number]) as of 03/18/2021 08:22  Ref. Range 03/17/2021 00:11 03/17/2021 05:50 03/17/2021 19:02 03/17/2021 20:16 03/18/2021 01:12  Troponin I (High Sensitivity) Latest Ref Range: <18 ng/L 713 (HH) 717 (HH) 299 (HH) 278 (HH) 245 (HH)  -Trending down  Elevated D-dimer - 4/15 D-dimer= 1.13 - This was obtained in setting of SOB and chest pain. - In addition patient with ESRD - Currently patient comfortable negative SOB, negative CP - Echocardiogram negative for right heart strain which is reassuring for negative PE - 4/16 bilateral lower extremity Doppler pending being on anticoagulation (Eliquis)  ESRD on HD, M/W/F  -Follow-up to HD on regularly scheduled day.  Hx pericarditis (s/p pericardial window  08/2019) - Echocardiogram negative for pericardial effusion or signs of cardiac tamponade -  Colchicine 0.3 mg M/W/F  Peripheral neuropathy, stable -- Gabapentin 100 mg daily  DM type II controlled with complication/DM nephropathy,  -Last A1c 4.7 in December 2021 - 4/16 hemoglobin A1c pending -Not on any medications  HLD  - 4/16 Lipitor 40 mg daily -4/17 LDL= 103 goal goal LDL<70  Anemia chronic disease - Anemia panel most consistent with anemia of chronic disease   Allergies - Patient having congestion with sneezing Flonase   Morbidly obese (BMI 37.58 kg/m)  - Patient meets criteria for bariatric surgery.  Should discuss with PCP as outpatient    Discharge Diagnoses:  Active Problems:   Hypotension   Atrial fibrillation with RVR (HCC)   Positive D dimer   Elevated troponin   Morbidly obese [facility])   Discharge Condition: Stable  Diet recommendation: Heart healthy/renal  Filed Weights   03/17/21 0409 03/18/21 0456  Weight: 105.6 kg 106.6 kg    History of present illness:  65 year old BF PMHx Asthma, DM type II controlled with complication (not on medication), DM nephropathy, dyslipidemia, ESRD on HD M/W/F,paroxysmalatrial fibrillation on Eliquis previously on amiodarone, pericarditis with pericardial effusion s/p perricardial window on 08/31/2019, DVT, PCOS, OSA,   Presented on 4/15 with worsening chest pain and associated shortness of breath started this morning after completing her dialysis session. At baseline she uses midodrine for hypotension particularly worsened with dialysis that typicallyrequires an extra dose. Patient reports immediatelyaftercompletingdialysis having worsening chest painin the center of her chest that she described as "heartburn" that felt similar to a prior episode of pericarditis in 2020.  She drove herself to a local urgent  care center(as her PCPs office was closed due to the holiday) andwas found to have severe chest  pain with atrial fibrillation with RVR and was transferred to [facility] by EMS.  In the ED patient was found to have a heart rate in the 170s with EKG confirming A. fib and worsening blood pressure from 126/106 to 56/34 prompting emergent cardioversion by the ED physician with 3 unsuccessful attempts. Patient then went into ventricular tachycardia requiring defibrillation and reported conversion to sinus rhythm. Cardiology evaluated patient in the ED and started amiodarone drip to maintain sinus rhythm. Blood pressure slowly improved to 80/52 with maps of 57 (baseline SBP's in the 90s). Patient reported no further symptoms.   [facility] service was consulted to assist in management.  Regarding her paroxysmal atrial fibrillation her amiodarone was stopped on 01/2020 as she was maintaining normal sinus rhythm but then restarted when seen in A. fib clinic on 05/2020 last evaluation on 01/2021 by her cardiologist she had that refilled it for the past 3 months and did not want to go back on it after discussing with her cardiologist. She was prescribed a 2-week Zio patch to assess for arrhythmia burden but did not apply since that visit  Hospital Course:  See above  Procedures: 4/15 PCXR:No acute cardiopulmonary disease. 4/15 echocardiogram:Left Ventricle: LVEF= 70 to 75%. The left ventricle has hyperdynamic function. -Moderate concentric LVH  4/16 bilateral lower extremity Doppler: Findings pending  Consultations: Cardiology  Cultures  4/16 SARS coronavirus negative    Discharge Exam: Vitals:   03/17/21 1926 03/18/21 0024 03/18/21 0456 03/18/21 0822  BP: (!) 97/53 (!) 97/56 (!) 99/50 (!) 109/51  Pulse: 79 75 68 75  Resp: '18 16 20 14  '$ Temp: (!) 97.5 F (36.4 C) 98 F (36.7 C) 98.6 F (37 C) 98.6 F (37 C)  TempSrc: Oral Oral Oral Oral  SpO2: 94% 95% 97% 100%  Weight:   106.6 kg     General: A/O x4, No acute respiratory distress Eyes: negative scleral  hemorrhage, negative anisocoria, negative icterus ENT: Negative Runny nose, negative gingival bleeding, Neck:  Negative scars, masses, torticollis, lymphadenopathy, JVD Lungs: Clear to auscultation bilaterally without wheezes or crackles Cardiovascular: Regular rate and rhythm without murmur gallop or rub normal S1 and S2   Discharge Instructions   Allergies as of 03/18/2021      Reactions   Iodine Shortness Of Breath   Other Shortness Of Breath, Anaphylaxis   Shellfish Allergy Shortness Of Breath   Shellfish-derived Products Shortness Of Breath   Amlodipine Swelling, Other (See Comments)   Edema   Felodipine Other (See Comments)   Headache   Lisinopril Cough      Medication List    STOP taking these medications   ethyl chloride spray   oxyCODONE 5 MG immediate release tablet Commonly known as: Roxicodone     TAKE these medications   amiodarone 200 MG tablet Commonly known as: PACERONE Take 1 tablet (200 mg total) by mouth 2 (two) times daily.   atorvastatin 40 MG tablet Commonly known as: LIPITOR Take 1 tablet (40 mg total) by mouth daily.   bisacodyl 5 MG EC tablet Commonly known as: DULCOLAX Take 5 mg by mouth daily as needed for moderate constipation.   calcitRIOL 0.25 MCG capsule Commonly known as: ROCALTROL Take 3 capsules (0.75 mcg total) by mouth every Monday, Wednesday, and Friday with hemodialysis. Notes to patient: Mon. Wed. Fri. With dialysis   colchicine 0.6 MG tablet Take 0.5 tablets (  0.3 mg total) by mouth every Monday, Wednesday, and Friday. What changed: See the new instructions. Notes to patient: Mon. Wed. Fri.   DIALYVITE TABLET Tabs Take 1 tablet by mouth daily.   diphenhydrAMINE 25 MG tablet Commonly known as: BENADRYL Take 25 mg by mouth daily as needed for itching.   docusate sodium 100 MG capsule Commonly known as: Colace Take 1 capsule (100 mg total) by mouth daily as needed. What changed: reasons to take this   Eliquis 5 MG  Tabs tablet Generic drug: apixaban Take 5 mg by mouth 2 (two) times daily.   fluticasone 50 MCG/ACT nasal spray Commonly known as: FLONASE Place 2 sprays into both nostrils daily.   gabapentin 100 MG capsule Commonly known as: NEURONTIN Take 100 mg by mouth at bedtime.   loratadine 10 MG tablet Commonly known as: CLARITIN Take 1 tablet (10 mg total) by mouth daily.   methocarbamol 500 MG tablet Commonly known as: Robaxin Take 1 tablet (500 mg total) by mouth every 8 (eight) hours as needed for muscle spasms. What changed:   when to take this  reasons to take this  additional instructions   midodrine 10 MG tablet Commonly known as: PROAMATINE Take 1 tablet (10 mg total) by mouth 3 (three) times daily with meals.   Velphoro 500 MG chewable tablet Generic drug: sucroferric oxyhydroxide Chew 2 tablets (1,000 mg total) by mouth 3 (three) times daily with meals. What changed:   how much to take  when to take this  additional instructions      Allergies  Allergen Reactions  . Iodine Shortness Of Breath  . Other Shortness Of Breath and Anaphylaxis  . Shellfish Allergy Shortness Of Breath  . Shellfish-Derived Products Shortness Of Breath  . Amlodipine Swelling and Other (See Comments)    Edema  . Felodipine Other (See Comments)    Headache  . Lisinopril Cough    Follow-up Information    Sueanne Margarita, MD Follow up on 03/28/2021.   Specialty: Cardiology Why: Follow-up with Dr. Fransico Him cardiology on 4/27 A. fib with RVR, acute on chronic hypotension Contact information: [id number] [address] [place] [address] [phone]                The results of significant diagnostics from this hospitalization (including imaging, microbiology, ancillary and laboratory) are listed below for reference.    Significant Diagnostic Studies: DG Chest Port 1 View  Result Date: 03/16/2021 CLINICAL DATA:  Pt with past medical history of ESRD on dialysis,  paroxysmal atrial fibrillation, uncontrolled diabetes, pericarditis presenting for acute onset this morning of midsternal chest pain rated 7 out of 10 with associated shortness of breath. EXAM: PORTABLE CHEST 1 VIEW COMPARISON:  11/21/2020 and older exams. FINDINGS: Cardiac silhouette normal in size and configuration. No mediastinal or hilar masses. Linear opacities noted in both lung bases consistent with chronic bronchial wall thickening and atelectasis, similar to prior studies. Remainder of the lungs is clear. No convincing pleural effusion and no pneumothorax. Skeletal structures are grossly intact. IMPRESSION: No acute cardiopulmonary disease. Electronically Signed   By: Lajean Manes M.D.   On: 03/16/2021 12:32   ECHOCARDIOGRAM COMPLETE  Result Date: 03/16/2021    ECHOCARDIOGRAM REPORT   Patient Name:   Brenda Arnold Spikes Date of Exam: 03/16/2021 Medical Rec #:  [id number]          Height:       66.0 in Accession #:    [id number]  Weight:       234.0 lb Date of Birth:  1956-07-20          BSA:          2.138 m Patient Age:    46 years           BP:           84/54 mmHg Patient Gender: F                  HR:           93 bpm. Exam Location:  Inpatient Procedure: 2D Echo, Cardiac Doppler and Color Doppler                           STAT ECHO Reported to: Dr Buford Dresser on 03/16/2021 1:55:00 PM       Dr. Darcel Bayley bedside to review images. Indications:    Ventricular tachycardia  History:        Patient has prior history of Echocardiogram examinations, most                 recent 02/04/2020. Arrythmias:Atrial Fibrillation; Risk                 Factors:Hypertension, Dyslipidemia and Diabetes. ESRD.  Sonographer:    Clayton Lefort RDCS (AE) Referring Phys: [id number] Donato Heinz  Sonographer Comments: Patient is morbidly obese. IMPRESSIONS  1. Left ventricular ejection fraction, by estimation, is 70 to 75%. The left ventricle has hyperdynamic function. The left ventricle has no  regional wall motion abnormalities. There is moderate concentric left ventricular hypertrophy. Left ventricular diastolic parameters were normal.  2. Right ventricular systolic function is normal. The right ventricular size is normal. There is normal pulmonary artery systolic pressure.  3. The mitral valve is grossly normal. Trivial mitral valve regurgitation. No evidence of mitral stenosis.  4. The aortic valve is tricuspid. There is moderate calcification of the aortic valve. There is mild thickening of the aortic valve. Aortic valve regurgitation is not visualized. Mild to moderate aortic valve sclerosis/calcification is present, without any evidence of aortic stenosis.  5. The inferior vena cava is normal in size with <50% respiratory variability, suggesting right atrial pressure of 8 mmHg. Comparison(s): No significant change from prior study. Conclusion(s)/Recommendation(s): Otherwise normal echocardiogram, with minor abnormalities described in the report. FINDINGS  Left Ventricle: Left ventricular ejection fraction, by estimation, is 70 to 75%. The left ventricle has hyperdynamic function. The left ventricle has no regional wall motion abnormalities. The left ventricular internal cavity size was normal in size. There is moderate concentric left ventricular hypertrophy. Left ventricular diastolic parameters were normal. Right Ventricle: The right ventricular size is normal. Right vetricular wall thickness was not well visualized. Right ventricular systolic function is normal. There is normal pulmonary artery systolic pressure. The tricuspid regurgitant velocity is 2.16 m/s, and with an assumed right atrial pressure of 8 mmHg, the estimated right ventricular systolic pressure is [id number] mmHg. Left Atrium: Left atrial size was normal in size. Right Atrium: Right atrial size was normal in size. Pericardium: There is no evidence of pericardial effusion. Mitral Valve: The mitral valve is grossly normal. Trivial mitral  valve regurgitation. No evidence of mitral valve stenosis. Tricuspid Valve: The tricuspid valve is normal in structure. Tricuspid valve regurgitation is trivial. Aortic Valve: The aortic valve is tricuspid. There is moderate calcification of the aortic valve. There is mild thickening of the aortic valve. Aortic valve  regurgitation is not visualized. Mild to moderate aortic valve sclerosis/calcification is present, without any evidence of aortic stenosis. Aortic valve mean gradient measures 4.0 mmHg. Aortic valve peak gradient measures 6.8 mmHg. Aortic valve area, by VTI measures 2.12 cm. Pulmonic Valve: The pulmonic valve was not well visualized. Pulmonic valve regurgitation is not visualized. No evidence of pulmonic stenosis. Aorta: The aortic root and ascending aorta are structurally normal, with no evidence of dilitation. Venous: The inferior vena cava is normal in size with less than 50% respiratory variability, suggesting right atrial pressure of 8 mmHg. IAS/Shunts: The atrial septum is grossly normal.  LEFT VENTRICLE PLAX 2D LVIDd:         3.20 cm LVIDs:         1.90 cm LV PW:         1.60 cm LV IVS:        1.50 cm LVOT diam:     1.80 cm LV SV:         51 LV SV Index:   24 LVOT Area:     2.54 cm  RIGHT VENTRICLE             IVC RV Basal diam:  2.90 cm     IVC diam: 1.95 cm RV S prime:     11.50 cm/s TAPSE (M-mode): 2.0 cm LEFT ATRIUM             Index       RIGHT ATRIUM           Index LA diam:        2.70 cm 1.26 cm/m  RA Area:     15.60 cm LA Vol (A2C):   30.6 ml 14.31 ml/m RA Volume:   38.60 ml  18.06 ml/m LA Vol (A4C):   23.5 ml 10.99 ml/m LA Biplane Vol: 28.4 ml 13.28 ml/m  AORTIC VALVE AV Area (Vmax):    1.92 cm AV Area (Vmean):   2.06 cm AV Area (VTI):     2.12 cm AV Vmax:           130.00 cm/s AV Vmean:          96.200 cm/s AV VTI:            0.242 m AV Peak Grad:      6.8 mmHg AV Mean Grad:      4.0 mmHg LVOT Vmax:         97.90 cm/s LVOT Vmean:        77.900 cm/s LVOT VTI:          0.202 m  LVOT/AV VTI ratio: 0.83  AORTA Ao Root diam: 2.90 cm Ao Asc diam:  3.10 cm TRICUSPID VALVE TR Peak grad:   18.7 mmHg TR Vmax:        216.00 cm/s  SHUNTS Systemic VTI:  0.20 m Systemic Diam: 1.80 cm Buford Dresser MD Electronically signed by Buford Dresser MD Signature Date/Time: 03/16/2021/2:47:08 PM    Final    XR HIP UNILAT W OR W/O PELVIS 2-3 VIEWS LEFT  Result Date: 03/01/2021 Well-seated prosthesis without complication  VAS [place] LOWER EXTREMITY VENOUS (DVT)  Result Date: 03/18/2021  Lower Venous DVT Study Indications: Elevated D-Dimer.  Limitations: Body habitus and patient could not tolerate compression. Comparison Study: Prior negative study done 11/03/2018 Performing Technologist: Sharion Dove RVS  Examination Guidelines: A complete evaluation includes B-mode imaging, spectral Doppler, color Doppler, and power Doppler as needed of all accessible portions of each vessel. Bilateral testing  is considered an integral part of a complete examination. Limited examinations for reoccurring indications may be performed as noted. The reflux portion of the exam is performed with the patient in reverse Trendelenburg.  +---------+---------------+---------+-----------+----------+-------------------+ RIGHT    CompressibilityPhasicitySpontaneityPropertiesThrombus Aging      +---------+---------------+---------+-----------+----------+-------------------+ CFV      Full           Yes      Yes                                      +---------+---------------+---------+-----------+----------+-------------------+ SFJ      Full                                                             +---------+---------------+---------+-----------+----------+-------------------+ FV Prox  Full                                                             +---------+---------------+---------+-----------+----------+-------------------+ FV Mid   Full                                                              +---------+---------------+---------+-----------+----------+-------------------+ FV DistalFull           Yes      Yes                                      +---------+---------------+---------+-----------+----------+-------------------+ PFV      Full                                                             +---------+---------------+---------+-----------+----------+-------------------+ POP      Full           Yes      Yes                                      +---------+---------------+---------+-----------+----------+-------------------+ PTV      Full                                                             +---------+---------------+---------+-----------+----------+-------------------+ PERO  Not well visualized +---------+---------------+---------+-----------+----------+-------------------+   +---------+---------------+---------+-----------+----------+-------------------+ LEFT     CompressibilityPhasicitySpontaneityPropertiesThrombus Aging      +---------+---------------+---------+-----------+----------+-------------------+ CFV      Full           Yes      Yes                                      +---------+---------------+---------+-----------+----------+-------------------+ SFJ      Full                                                             +---------+---------------+---------+-----------+----------+-------------------+ FV Prox  Full                                                             +---------+---------------+---------+-----------+----------+-------------------+ FV Mid   Full           Yes      Yes                                      +---------+---------------+---------+-----------+----------+-------------------+ FV DistalFull           Yes      Yes                                       +---------+---------------+---------+-----------+----------+-------------------+ PFV      Full                                                             +---------+---------------+---------+-----------+----------+-------------------+ POP      Full           Yes      Yes                                      +---------+---------------+---------+-----------+----------+-------------------+ PTV      Full                                                             +---------+---------------+---------+-----------+----------+-------------------+ PERO                                                  Not well visualized +---------+---------------+---------+-----------+----------+-------------------+     *See table(s) above for measurements and observations. Electronically signed by Erlene Quan  Donzetta Matters MD on 03/18/2021 at 8:58:43 AM.    Final     Microbiology: Recent Results (from the past 240 hour(s))  SARS CORONAVIRUS 2 (TAT 6-24 HRS) Nasopharyngeal Nasopharyngeal Swab     Status: None   Collection Time: 03/17/21  5:32 AM   Specimen: Nasopharyngeal Swab  Result Value Ref Range Status   SARS Coronavirus 2 NEGATIVE NEGATIVE Final    Comment: (NOTE) SARS-CoV-2 target nucleic acids are NOT DETECTED.  The SARS-CoV-2 RNA is generally detectable in upper and lower respiratory specimens during the acute phase of infection. Negative results do not preclude SARS-CoV-2 infection, do not rule out co-infections with other pathogens, and should not be used as the sole basis for treatment or other patient management decisions. Negative results must be combined with clinical observations, patient history, and epidemiological information. The expected result is Negative.  Fact Sheet for Patients: SugarRoll.be  Fact Sheet for Healthcare Providers: https://www.Jeane Cashatt-mathews.com/  This test is not yet approved or cleared by the [place] FDA and  has  been authorized for detection and/or diagnosis of SARS-CoV-2 by FDA under an Emergency Use Authorization (EUA). This EUA will remain  in effect (meaning this test can be used) for the duration of the COVID-19 declaration under Se ction 564(b)(1) of the Act, 21 U.S.C. section 360bbb-3(b)(1), unless the authorization is terminated or revoked sooner.  Performed at [facility], [place] [address], [place], [place] [address]      Labs: Basic Metabolic Panel: Recent Labs  Lab 03/16/21 1155 03/17/21 0011 03/18/21 0112  NA 138 136 138  K 3.4* 4.2 4.4  CL 96* 95* 96*  CO2 '25 30 29  '$ GLUCOSE 195* 113* 98  BUN 16 26* 47*  CREATININE 5.07* 6.34* 8.97*  CALCIUM 9.8 9.9 10.3  MG  --   --  2.0  PHOS  --   --  4.4   Liver Function Tests: Recent Labs  Lab 03/16/21 1155 03/17/21 0011 03/18/21 0112  AST '31 28 23  '$ ALT '26 26 22  '$ ALKPHOS 260* 255* 241*  BILITOT 0.7 0.7 0.5  PROT 6.8 6.8 6.3*  ALBUMIN 3.6 3.7 3.4*   No results for input(s): LIPASE, AMYLASE in the last 168 hours. No results for input(s): AMMONIA in the last 168 hours. CBC: Recent Labs  Lab 03/16/21 1704 03/17/21 0011 03/18/21 0112  WBC 8.4 7.9 8.5  NEUTROABS  --  4.9 5.1  HGB 10.6* 11.0* 10.2*  HCT 33.6* 34.2* 32.0*  MCV 97.1 97.2 95.8  PLT 121* 150 158   Cardiac Enzymes: No results for input(s): CKTOTAL, CKMB, CKMBINDEX, TROPONINI in the last 168 hours. BNP: BNP (last 3 results) No results for input(s): BNP in the last 8760 hours.  ProBNP (last 3 results) No results for input(s): PROBNP in the last 8760 hours.  CBG: Recent Labs  Lab 03/16/21 1147  GLUCAP 164*       Signed:  Dia Crawford, MD [facility]

## 2021-03-18 NOTE — Plan of Care (Signed)

## 2021-03-18 NOTE — Progress Notes (Signed)
Progress Note  Patient Name: Brenda Arnold Date of Encounter: 03/18/2021  [facility] Cardiologist: Fransico Him, MD   Subjective   "I feel great"  No SOB or CP   Inpatient Medications    Scheduled Meds: . amiodarone  400 mg Oral BID  . apixaban  5 mg Oral BID  . atorvastatin  40 mg Oral Daily  . [START ON 03/19/2021] calcitRIOL  0.75 mcg Oral Q M,W,F-HD  . colchicine  0.3 mg Oral Q M,W,F  . fluticasone  2 spray Each Nare Daily  . gabapentin  100 mg Oral QHS  . loratadine  10 mg Oral Daily  . midodrine  10 mg Oral TID WC  . sucroferric oxyhydroxide  1,000 mg Oral TID WC   Continuous Infusions:  PRN Meds: bisacodyl, diphenhydrAMINE, docusate sodium, methocarbamol, ondansetron **OR** ondansetron (ZOFRAN) IV, oxyCODONE, sodium chloride   Vital Signs    Vitals:   03/17/21 1120 03/17/21 1926 03/18/21 0024 03/18/21 0456  BP: 116/67 (!) 97/53 (!) 97/56 (!) 99/50  Pulse: 82 79 75 68  Resp: (!) '23 18 16 20  '$ Temp: 98.6 F (37 C) (!) 97.5 F (36.4 C) 98 F (36.7 C) 98.6 F (37 C)  TempSrc: Oral Oral Oral Oral  SpO2: 94% 94% 95% 97%  Weight:    106.6 kg    Intake/Output Summary (Last 24 hours) at 03/18/2021 0805 Last data filed at 03/17/2021 1700 Gross per 24 hour  Intake 240 ml  Output --  Net 240 ml   Last 3 Weights 03/18/2021 03/17/2021 02/21/2021  Weight (lbs) 235 lb 0.2 oz 232 lb 12.9 oz 234 lb  Weight (kg) 106.6 kg 105.6 kg 106.142 kg      Telemetry    SR 60-70s  - Personally Reviewed  ECG    No new EKGs  - Personally Reviewed  Physical Exam   GEN: No acute distress.   Neck: No JVD Cardiac: RRR, no murmurs, Respiratory: Clear to auscultation bilaterally. GI: Soft, nontender, non-distended  MS: No edema; No deformity. Neuro:  Nonfocal  Psych: Normal affect   Labs    High Sensitivity Troponin:   Recent Labs  Lab 03/17/21 0011 03/17/21 0550 03/17/21 1902 03/17/21 2016 03/18/21 0112  TROPONINIHS 713* 717* 299* 278* 245*       Chemistry Recent Labs  Lab 03/16/21 1155 03/17/21 0011 03/18/21 0112  NA 138 136 138  K 3.4* 4.2 4.4  CL 96* 95* 96*  CO2 '25 30 29  '$ GLUCOSE 195* 113* 98  BUN 16 26* 47*  CREATININE 5.07* 6.34* 8.97*  CALCIUM 9.8 9.9 10.3  PROT 6.8 6.8 6.3*  ALBUMIN 3.6 3.7 3.4*  AST '31 28 23  '$ ALT '26 26 22  '$ ALKPHOS 260* 255* 241*  BILITOT 0.7 0.7 0.5  GFRNONAA 9* 7* 5*  ANIONGAP 17* 11 13     Hematology Recent Labs  Lab 03/16/21 1704 03/17/21 0011 03/17/21 0905 03/18/21 0112  WBC 8.4 7.9  --  8.5  RBC 3.46* 3.52* 3.53* 3.34*  HGB 10.6* 11.0*  --  10.2*  HCT 33.6* 34.2*  --  32.0*  MCV 97.1 97.2  --  95.8  MCH 30.6 31.3  --  30.5  MCHC 31.5 32.2  --  31.9  RDW 15.4 15.5  --  15.4  PLT 121* 150  --  158    BNPNo results for input(s): BNP, PROBNP in the last 168 hours.   DDimer  Recent Labs  Lab 03/16/21 1155  DDIMER 1.13*  Radiology    DG Chest Port 1 View  Result Date: 03/16/2021 CLINICAL DATA:  Pt with past medical history of ESRD on dialysis, paroxysmal atrial fibrillation, uncontrolled diabetes, pericarditis presenting for acute onset this morning of midsternal chest pain rated 7 out of 10 with associated shortness of breath. EXAM: PORTABLE CHEST 1 VIEW COMPARISON:  11/21/2020 and older exams. FINDINGS: Cardiac silhouette normal in size and configuration. No mediastinal or hilar masses. Linear opacities noted in both lung bases consistent with chronic bronchial wall thickening and atelectasis, similar to prior studies. Remainder of the lungs is clear. No convincing pleural effusion and no pneumothorax. Skeletal structures are grossly intact. IMPRESSION: No acute cardiopulmonary disease. Electronically Signed   By: Lajean Manes M.D.   On: 03/16/2021 12:32   ECHOCARDIOGRAM COMPLETE  Result Date: 03/16/2021    ECHOCARDIOGRAM REPORT   Patient Name:   Arnold CARBONARO Brenda Date of Exam: 03/16/2021 Medical Rec #:  [id number]          Height:       66.0 in Accession #:     [id number]         Weight:       234.0 lb Date of Birth:  11/15/1956          BSA:          2.138 m Patient Age:    65 years           BP:           84/54 mmHg Patient Gender: F                  HR:           93 bpm. Exam Location:  Inpatient Procedure: 2D Echo, Cardiac Doppler and Color Doppler                           STAT ECHO Reported to: Dr Buford Dresser on 03/16/2021 1:55:00 PM       Dr. Darcel Bayley bedside to review images. Indications:    Ventricular tachycardia  History:        Patient has prior history of Echocardiogram examinations, most                 recent 02/04/2020. Arrythmias:Atrial Fibrillation; Risk                 Factors:Hypertension, Dyslipidemia and Diabetes. ESRD.  Sonographer:    Clayton Lefort RDCS (AE) Referring Phys: [id number] Donato Heinz  Sonographer Comments: Patient is morbidly obese. IMPRESSIONS  1. Left ventricular ejection fraction, by estimation, is 70 to 75%. The left ventricle has hyperdynamic function. The left ventricle has no regional wall motion abnormalities. There is moderate concentric left ventricular hypertrophy. Left ventricular diastolic parameters were normal.  2. Right ventricular systolic function is normal. The right ventricular size is normal. There is normal pulmonary artery systolic pressure.  3. The mitral valve is grossly normal. Trivial mitral valve regurgitation. No evidence of mitral stenosis.  4. The aortic valve is tricuspid. There is moderate calcification of the aortic valve. There is mild thickening of the aortic valve. Aortic valve regurgitation is not visualized. Mild to moderate aortic valve sclerosis/calcification is present, without any evidence of aortic stenosis.  5. The inferior vena cava is normal in size with <50% respiratory variability, suggesting right atrial pressure of 8 mmHg. Comparison(s): No significant change from prior study. Conclusion(s)/Recommendation(s): Otherwise normal echocardiogram, with minor  abnormalities  described in the report. FINDINGS  Left Ventricle: Left ventricular ejection fraction, by estimation, is 70 to 75%. The left ventricle has hyperdynamic function. The left ventricle has no regional wall motion abnormalities. The left ventricular internal cavity size was normal in size. There is moderate concentric left ventricular hypertrophy. Left ventricular diastolic parameters were normal. Right Ventricle: The right ventricular size is normal. Right vetricular wall thickness was not well visualized. Right ventricular systolic function is normal. There is normal pulmonary artery systolic pressure. The tricuspid regurgitant velocity is 2.16 m/s, and with an assumed right atrial pressure of 8 mmHg, the estimated right ventricular systolic pressure is [id number] mmHg. Left Atrium: Left atrial size was normal in size. Right Atrium: Right atrial size was normal in size. Pericardium: There is no evidence of pericardial effusion. Mitral Valve: The mitral valve is grossly normal. Trivial mitral valve regurgitation. No evidence of mitral valve stenosis. Tricuspid Valve: The tricuspid valve is normal in structure. Tricuspid valve regurgitation is trivial. Aortic Valve: The aortic valve is tricuspid. There is moderate calcification of the aortic valve. There is mild thickening of the aortic valve. Aortic valve regurgitation is not visualized. Mild to moderate aortic valve sclerosis/calcification is present, without any evidence of aortic stenosis. Aortic valve mean gradient measures 4.0 mmHg. Aortic valve peak gradient measures 6.8 mmHg. Aortic valve area, by VTI measures 2.12 cm. Pulmonic Valve: The pulmonic valve was not well visualized. Pulmonic valve regurgitation is not visualized. No evidence of pulmonic stenosis. Aorta: The aortic root and ascending aorta are structurally normal, with no evidence of dilitation. Venous: The inferior vena cava is normal in size with less than 50% respiratory variability,  suggesting right atrial pressure of 8 mmHg. IAS/Shunts: The atrial septum is grossly normal.  LEFT VENTRICLE PLAX 2D LVIDd:         3.20 cm LVIDs:         1.90 cm LV PW:         1.60 cm LV IVS:        1.50 cm LVOT diam:     1.80 cm LV SV:         51 LV SV Index:   24 LVOT Area:     2.54 cm  RIGHT VENTRICLE             IVC RV Basal diam:  2.90 cm     IVC diam: 1.95 cm RV S prime:     11.50 cm/s TAPSE (M-mode): 2.0 cm LEFT ATRIUM             Index       RIGHT ATRIUM           Index LA diam:        2.70 cm 1.26 cm/m  RA Area:     15.60 cm LA Vol (A2C):   30.6 ml 14.31 ml/m RA Volume:   38.60 ml  18.06 ml/m LA Vol (A4C):   23.5 ml 10.99 ml/m LA Biplane Vol: 28.4 ml 13.28 ml/m  AORTIC VALVE AV Area (Vmax):    1.92 cm AV Area (Vmean):   2.06 cm AV Area (VTI):     2.12 cm AV Vmax:           130.00 cm/s AV Vmean:          96.200 cm/s AV VTI:            0.242 m AV Peak Grad:      6.8 mmHg AV Mean Grad:  4.0 mmHg LVOT Vmax:         97.90 cm/s LVOT Vmean:        77.900 cm/s LVOT VTI:          0.202 m LVOT/AV VTI ratio: 0.83  AORTA Ao Root diam: 2.90 cm Ao Asc diam:  3.10 cm TRICUSPID VALVE TR Peak grad:   18.7 mmHg TR Vmax:        216.00 cm/s  SHUNTS Systemic VTI:  0.20 m Systemic Diam: 1.80 cm Buford Dresser MD Electronically signed by Buford Dresser MD Signature Date/Time: 03/16/2021/2:47:08 PM    Final    VAS [place] LOWER EXTREMITY VENOUS (DVT)  Result Date: 03/17/2021  Lower Venous DVT Study Indications: Elevated D-Dimer.  Limitations: Body habitus and patient could not tolerate compression. Comparison Study: Prior negative study done 11/03/2018 Performing Technologist: Sharion Dove RVS  Examination Guidelines: A complete evaluation includes B-mode imaging, spectral Doppler, color Doppler, and power Doppler as needed of all accessible portions of each vessel. Bilateral testing is considered an integral part of a complete examination. Limited examinations for reoccurring indications may be  performed as noted. The reflux portion of the exam is performed with the patient in reverse Trendelenburg.  +---------+---------------+---------+-----------+----------+-------------------+ RIGHT    CompressibilityPhasicitySpontaneityPropertiesThrombus Aging      +---------+---------------+---------+-----------+----------+-------------------+ CFV      Full           Yes      Yes                                      +---------+---------------+---------+-----------+----------+-------------------+ SFJ      Full                                                             +---------+---------------+---------+-----------+----------+-------------------+ FV Prox  Full                                                             +---------+---------------+---------+-----------+----------+-------------------+ FV Mid   Full                                                             +---------+---------------+---------+-----------+----------+-------------------+ FV DistalFull           Yes      Yes                                      +---------+---------------+---------+-----------+----------+-------------------+ PFV      Full                                                             +---------+---------------+---------+-----------+----------+-------------------+  POP      Full           Yes      Yes                                      +---------+---------------+---------+-----------+----------+-------------------+ PTV      Full                                                             +---------+---------------+---------+-----------+----------+-------------------+ PERO                                                  Not well visualized +---------+---------------+---------+-----------+----------+-------------------+   +---------+---------------+---------+-----------+----------+-------------------+ LEFT      CompressibilityPhasicitySpontaneityPropertiesThrombus Aging      +---------+---------------+---------+-----------+----------+-------------------+ CFV      Full           Yes      Yes                                      +---------+---------------+---------+-----------+----------+-------------------+ SFJ      Full                                                             +---------+---------------+---------+-----------+----------+-------------------+ FV Prox  Full                                                             +---------+---------------+---------+-----------+----------+-------------------+ FV Mid   Full           Yes      Yes                                      +---------+---------------+---------+-----------+----------+-------------------+ FV DistalFull           Yes      Yes                                      +---------+---------------+---------+-----------+----------+-------------------+ PFV      Full                                                             +---------+---------------+---------+-----------+----------+-------------------+ POP      Full           Yes  Yes                                      +---------+---------------+---------+-----------+----------+-------------------+ PTV      Full                                                             +---------+---------------+---------+-----------+----------+-------------------+ PERO                                                  Not well visualized +---------+---------------+---------+-----------+----------+-------------------+      *See table(s) above for measurements and observations.    Preliminary     Cardiac Studies    Echo 03/16/21   1. Left ventricular ejection fraction, by estimation, is 70 to 75%. The  left ventricle has hyperdynamic function. The left ventricle has no  regional wall motion abnormalities. There is moderate concentric left   ventricular hypertrophy. Left ventricular  diastolic parameters were normal.  2. Right ventricular systolic function is normal. The right ventricular  size is normal. There is normal pulmonary artery systolic pressure.  3. The mitral valve is grossly normal. Trivial mitral valve  regurgitation. No evidence of mitral stenosis.  4. The aortic valve is tricuspid. There is moderate calcification of the  aortic valve. There is mild thickening of the aortic valve. Aortic valve  regurgitation is not visualized. Mild to moderate aortic valve  sclerosis/calcification is present, without  any evidence of aortic stenosis.  5. The inferior vena cava is normal in size with <50% respiratory  variability, suggesting right atrial pressure of 8 mmHg.   Comparison(s): No significant change from prior study.   Echo: 02/13/21  1. Left ventricular ejection fraction, by estimation, is 70 to 75%. The  left ventricle has hyperdynamic function. The left ventricle has no  regional wall motion abnormalities. There is moderate concentric left  ventricular hypertrophy. Left ventricular  diastolic parameters were normal.  2. Right ventricular systolic function is normal. The right ventricular  size is normal. There is normal pulmonary artery systolic pressure.  3. The mitral valve is grossly normal. Trivial mitral valve  regurgitation. No evidence of mitral stenosis.  4. The aortic valve is tricuspid. There is moderate calcification of the  aortic valve. There is mild thickening of the aortic valve. Aortic valve  regurgitation is not visualized. Mild to moderate aortic valve  sclerosis/calcification is present, without  any evidence of aortic stenosis.  5. The inferior vena cava is normal in size with <50% respiratory  variability, suggesting right atrial pressure of 8 mmHg.   Comparison(s): No significant change from prior study.   MYOVIEW  04/17/17    Nuclear stress EF: 68%.  There was no ST  segment deviation noted during stress.  The study is normal.  This is a low risk study.  The left ventricular ejection fraction is hyperdynamic (>65%).   Normal pharmacologic nuclear study with no evidence of prior infarct or ischemia.   Patient Profile      Brenda Arnold is a 65 y.o. female with a hx of ESRD, paroxysmal atrial  fibrillation on Eliquis, cardiac tamponade in 2020 status post pericardial window who is being seen today for the evaluation of Afib with RVR at the request of Dr Joya Gaskins.  Assessment & Plan    1  Atrial fibrillation  Patient with a hx of PAF Had been on amiodarone in past.  Stoppped in Feb 2021 REcurrened in June then went back into SR  AMiodarone started back   Talking to patient today she said it was actually afib clnic that took her off of amiodarone, she did not make that decision.  Now she remains in SR   I would continue PO amio 200 bid   Continue Eliquis     Plan for f/u as outpt for continued monitoring   2  Elevated troponin PRob due to demand ischemia form RVR and cardioversions in setting of ESRD pt  Pt does have CAD on CT scan done in 2020  (multivessel)  Myovue normal in 2018  Pt doing ok until right after dialysis   Trop elevation may be due to hypotension, afib with RVR and defibrilation in setting of ESRD/dialysis   Echo with hyperdynamic LV No wall motion changes    Pt walked briskly per nursing and did well yesterday   3  Hx pericardial effusion  Pt is s/p window   Echo shows no effusion  4  Hx hypotension  BP is fair  Keep on midodrine     5  ESRD  On dialysis     Has M/W/F  4  HL   REfused statin Rx in past  Wil need to be reviewed as outpt  5  OSA  Pt says she is sleeping better since losing wt   This should be relookded at as outpt   She is not using now  OK to d/c pt home from cardiac standpoint    For questions or updates, please contact [facility] Please consult [email or web address] for contact info under         Signed, Dorris Carnes, MD  03/18/2021, 8:05 AM

## 2021-03-19 ENCOUNTER — Telehealth: Payer: Self-pay | Admitting: Physician Assistant

## 2021-03-19 LAB — HEMOGLOBIN A1C
Hgb A1c MFr Bld: 5.2 % (ref 4.8–5.6)
Mean Plasma Glucose: 103 mg/dL

## 2021-03-19 NOTE — Telephone Encounter (Signed)
Transition of care contact from inpatient facility  Date of discharge: 03/18/21 Date of contact:  03/17/21 Method: Phone Spoke to: Patient  Patient contacted to discuss transition of care from recent inpatient hospitalization. Patient was admitted to [facility]  with discharge diagnosis of a.fib RVR. Amiodarone was restarted and patient reports feeling much better.  Medication changes were reviewed. Has not picked up amiodarone yet, plans to go to the pharmacy this afternoon. Reminded to continue Eliquis  Patient will follow up with his/her outpatient HD unit on:  03/21/21 (already had HD this AM with no complications per pt)  Anice Paganini, PA-C 03/19/2021, 1:45 PM  [facility]

## 2021-03-21 DIAGNOSIS — E46 Unspecified protein-calorie malnutrition: Secondary | ICD-10-CM | POA: Insufficient documentation

## 2021-03-23 DIAGNOSIS — N186 End stage renal disease: Secondary | ICD-10-CM | POA: Diagnosis not present

## 2021-03-28 ENCOUNTER — Other Ambulatory Visit: Payer: Self-pay

## 2021-03-28 ENCOUNTER — Ambulatory Visit (INDEPENDENT_AMBULATORY_CARE_PROVIDER_SITE_OTHER): Payer: Medicare Other | Admitting: Physician Assistant

## 2021-03-28 ENCOUNTER — Encounter: Payer: Self-pay | Admitting: Physician Assistant

## 2021-03-28 VITALS — BP 100/60 | HR 76 | Ht 66.5 in | Wt 238.2 lb

## 2021-03-28 DIAGNOSIS — I200 Unstable angina: Secondary | ICD-10-CM | POA: Diagnosis not present

## 2021-03-28 DIAGNOSIS — E7800 Pure hypercholesterolemia, unspecified: Secondary | ICD-10-CM | POA: Diagnosis not present

## 2021-03-28 DIAGNOSIS — E785 Hyperlipidemia, unspecified: Secondary | ICD-10-CM

## 2021-03-28 DIAGNOSIS — N186 End stage renal disease: Secondary | ICD-10-CM | POA: Diagnosis not present

## 2021-03-28 DIAGNOSIS — I313 Pericardial effusion (noninflammatory): Secondary | ICD-10-CM

## 2021-03-28 DIAGNOSIS — I3139 Other pericardial effusion (noninflammatory): Secondary | ICD-10-CM

## 2021-03-28 DIAGNOSIS — Z992 Dependence on renal dialysis: Secondary | ICD-10-CM

## 2021-03-28 DIAGNOSIS — I480 Paroxysmal atrial fibrillation: Secondary | ICD-10-CM

## 2021-03-28 MED ORDER — AMIODARONE HCL 200 MG PO TABS: 200.0000 mg | ORAL_TABLET | Freq: Every day | ORAL | 3 refills | Status: DC

## 2021-03-28 NOTE — Progress Notes (Signed)
Cardiology Office Note:    Date:  03/28/2021   ID:  Brenda Arnold, DOB [birth year], MRN [id number]  PCP:  Kelton Pillar, MD  [facility] Cardiologist:  Fransico Him, MD  [place] Electrophysiologist:  None   Chief Complaint: Hospital follow up   History of Present Illness:    Brenda Arnold is a 65 y.o. female with a hx of ESRD on HD, paroxysmal atrial fibrillation on Eliquis, cardiac tamponade in 2020 status post pericardial window and hx of hypotensive with dialysis seen for hospital follow up.   Patient presented to ER 03/16/2021 for lightheaded and burning sensation (felt like GERD) and found to be in afib RVR at rate of 170s.  Underwent failed cardioversion x3 in the ED. Subsequently there was concern for VT and she was defibrillated, with conversion to sinus rhythm but Dr. Gardiner Rhyme felt patient was still in afib RVR. Echocardiogram shows hyperdynamic LV function, no pericardial effusion. Treated with amiodarone drip>> converted to sinus >> placed on po dose (previously DC amiodarone by afib clinc?). Echo with LVEF of 70-75%.   Patient is here for follow-up.  Denies recurrent episode of burning sensation of lightheadedness.  Denies chest pain, shortness of breath, orthopnea, PND, syncope, lower extremity edema or melena.  Compliant with her medication.  Completed her dialysis this morning. Past Medical History:  Diagnosis Date  . Anemia   . Arthritis   . Asthma   . Complication of anesthesia    difficulty with getting oxygen saturation up- "patient was not aware"  Bottom of feet burning in PACU  . Constipation    because of Iron  . Diabetes mellitus    not on meds  . DVT (deep venous thrombosis) ([place]) 2019   post hip replacement - right  . Dyslipidemia   . Epistaxis 11/05/2012  . ESRD (end stage renal disease) on dialysis [facility])    "MWF; Jeneen Rinks" (09/15/2018)- started 02/13/2017  . History of blood transfusion   . HTN (hypertension)   .  Hyperparathyroidism due to renal insufficiency ([place])   . Obesity    s/p panniculectomy  . Paroxysmal A-fib ([facility])    a. chronic coumadin;  b. 12/2009 Echo: EF 60-65%, Gr 1 DD.  Marland Kitchen PCOS (polycystic ovarian syndrome)   . Pericarditis 08/2019   pericarditis with pericardial effusion, s/p right VATS/pericardial window 08/31/19  . Pneumonia   . Seasonal allergies   . Sleep apnea    a. not using CPAP, last study  >8 yrs  . Vitamin D deficiency     Past Surgical History:  Procedure Laterality Date  . ABDOMINAL HYSTERECTOMY     with panniculctomy  . AV FISTULA PLACEMENT  09/02/2012   Procedure: ARTERIOVENOUS (AV) FISTULA CREATION;  Surgeon: Elam Dutch, MD;  Location: [facility] OR;  Service: Vascular;  Laterality: Left;  Creation of Left Radial-Cephalic Fistula   . BREAST SURGERY     Biopsy right breast  . COLONOSCOPY    . COLONOSCOPY W/ BIOPSIES AND POLYPECTOMY    . DILATION AND CURETTAGE OF UTERUS    . KNEE ARTHROSCOPY Right   . PANNICULECTOMY    . REVISON OF ARTERIOVENOUS FISTULA Left 04/27/2014   Procedure: REVISON OF LEFT RADIAL-CEPHALIC ARTERIOVENOUS FISTULA;  Surgeon: Mal Misty, MD;  Location: [place];  Service: Vascular;  Laterality: Left;  . REVISON OF ARTERIOVENOUS FISTULA Left 01/13/2020   Procedure: REVISON OF ARTERIOVENOUS FISTULA;  Surgeon: Marty Heck, MD;  Location: [place];  Service: Vascular;  Laterality: Left;  .  TOTAL HIP ARTHROPLASTY Right 09/15/2018  . TOTAL HIP ARTHROPLASTY Right 09/15/2018   Procedure: RIGHT TOTAL HIP ARTHROPLASTY ANTERIOR APPROACH;  Surgeon: Leandrew Koyanagi, MD;  Location: [place];  Service: Orthopedics;  Laterality: Right;  . TOTAL HIP ARTHROPLASTY Left 11/23/2020   Procedure: LEFT TOTAL HIP ARTHROPLASTY ANTERIOR APPROACH;  Surgeon: Leandrew Koyanagi, MD;  Location: [place];  Service: Orthopedics;  Laterality: Left;  NEEDS RNFA PLEASE  . UVULOPLASTY    . VIDEO ASSISTED THORACOSCOPY (VATS)/WEDGE RESECTION Right 08/31/2019   Procedure: VIDEO ASSISTED  THORACOSCOPY/ DRAINAGE OF PERICARDIAL EFFUSION/ PERICARDIAL WINDOW/ ABORTED PERICARDIOCENTESIS ;  Surgeon: Lajuana Matte, MD;  Location: [facility] OR;  Service: Thoracic;  Laterality: Right;    Current Medications: Current Meds  Medication Sig  . [START ON 04/01/2021] amiodarone (PACERONE) 200 MG tablet Take 1 tablet (200 mg total) by mouth daily.  Marland Kitchen atorvastatin (LIPITOR) 40 MG tablet Take 1 tablet (40 mg total) by mouth daily.  . B Complex-C-Folic Acid (DIALYVITE TABLET) TABS Take 1 tablet by mouth daily.  . bisacodyl (DULCOLAX) 5 MG EC tablet Take 5 mg by mouth daily as needed for moderate constipation.  . colchicine 0.6 MG tablet Take 0.5 tablets (0.3 mg total) by mouth every Monday, Wednesday, and Friday.  . diphenhydrAMINE (BENADRYL) 25 MG tablet Take 25 mg by mouth daily as needed for itching.  . docusate sodium (COLACE) 100 MG capsule Take 1 capsule (100 mg total) by mouth daily as needed.  Marland Kitchen ELIQUIS 5 MG TABS tablet Take 5 mg by mouth 2 (two) times daily.   Marland Kitchen ethyl chloride spray SMARTSIG:Sparingly Topical 3 Times Daily  . fluticasone (FLONASE) 50 MCG/ACT nasal spray Place 2 sprays into both nostrils daily.  Marland Kitchen gabapentin (NEURONTIN) 100 MG capsule Take 100 mg by mouth at bedtime.  Marland Kitchen loratadine (CLARITIN) 10 MG tablet Take 1 tablet (10 mg total) by mouth daily.  . methocarbamol (ROBAXIN) 500 MG tablet Take 1 tablet (500 mg total) by mouth every 8 (eight) hours as needed for muscle spasms.  . Methoxy PEG-Epoetin Beta (MIRCERA IJ) Mircera  . midodrine (PROAMATINE) 10 MG tablet Take 1 tablet (10 mg total) by mouth 3 (three) times daily with meals.  . sucroferric oxyhydroxide (VELPHORO) 500 MG chewable tablet Chew 2 tablets (1,000 mg total) by mouth 3 (three) times daily with meals.  . [DISCONTINUED] amiodarone (PACERONE) 200 MG tablet Take 1 tablet (200 mg total) by mouth 2 (two) times daily.     Allergies:   Iodine, Other, Shellfish allergy, Shellfish-derived products, Amlodipine,  Felodipine, and Lisinopril   Social History   Socioeconomic History  . Marital status: Single    Spouse name: Not on file  . Number of children: Not on file  . Years of education: Not on file  . Highest education level: Not on file  Occupational History  . Occupation: Dance movement psychotherapist.    Employer: [employer]  Tobacco Use  . Smoking status: Never Smoker  . Smokeless tobacco: Never Used  Vaping Use  . Vaping Use: Never used  Substance and Sexual Activity  . Alcohol use: No    Alcohol/week: 0.0 standard drinks  . Drug use: No  . Sexual activity: Not on file    Comment: Hysterectomy  Other Topics Concern  . Not on file  Social History Narrative   Lives in [place] alone. 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   Social Determinants of Health   Financial Resource Strain: Not on file  Food Insecurity:  Not on file  Transportation Needs: Not on file  Physical Activity: Not on file  Stress: Not on file  Social Connections: Not on file     Family History: The patient's family history includes Diabetes in her brother; Hypertension in her mother; Kidney disease in her brother; Lung cancer in her father.   ROS:   Please see the history of present illness.    All other systems reviewed and are negative.   EKGs/Labs/Other Studies Reviewed:    The following studies were reviewed today:  Echo 03/16/21 1. Left ventricular ejection fraction, by estimation, is 70 to 75%. The  left ventricle has hyperdynamic function. The left ventricle has no  regional wall motion abnormalities. There is moderate concentric left  ventricular hypertrophy. Left ventricular  diastolic parameters were normal.  2. Right ventricular systolic function is normal. The right ventricular  size is normal. There is normal pulmonary artery systolic pressure.  3. The mitral valve is grossly normal. Trivial mitral valve  regurgitation. No evidence of mitral stenosis.  4.  The aortic valve is tricuspid. There is moderate calcification of the  aortic valve. There is mild thickening of the aortic valve. Aortic valve  regurgitation is not visualized. Mild to moderate aortic valve  sclerosis/calcification is present, without  any evidence of aortic stenosis.  5. The inferior vena cava is normal in size with <50% respiratory  variability, suggesting right atrial pressure of 8 mmHg.   EKG:  EKG is ordered today.  The ekg ordered today demonstrates sinus rhythm and bifascicular block  Recent Labs: 03/16/2021: TSH 1.471 03/18/2021: ALT 22; BUN 47; Creatinine, Ser 8.97; Hemoglobin 10.2; Magnesium 2.0; Platelets 158; Potassium 4.4; Sodium 138  Recent Lipid Panel    Component Value Date/Time   CHOL 164 03/18/2021 0112   TRIG 75 03/18/2021 0112   HDL 46 03/18/2021 0112   CHOLHDL 3.6 03/18/2021 0112   VLDL 15 03/18/2021 0112   LDLCALC 103 (H) 03/18/2021 0112     Risk Assessment/Calculations:    CHA2DS2-VASc Score = 6  6}This indicates a 9.7% annual risk of stroke. The patient's score is based upon: CHF History: No HTN History: Yes Diabetes History: Yes Stroke History: Yes (provoked VTE after hip surgery) Vascular Disease History: Yes Age Score: 0 Gender Score: 1   Physical Exam:    VS:  BP 100/60   Pulse 76   Ht 5' 6.5" (1.689 m)   Wt 238 lb 3.2 oz (108 kg)   SpO2 96%   BMI 37.87 kg/m     Wt Readings from Last 3 Encounters:  03/28/21 238 lb 3.2 oz (108 kg)  03/18/21 235 lb 0.2 oz (106.6 kg)  02/21/21 234 lb (106.1 kg)     GEN: Well nourished, well developed in no acute distress HEENT: Normal NECK: No JVD; No carotid bruits LYMPHATICS: No lymphadenopathy CARDIAC: RRR, no murmurs, rubs, gallops RESPIRATORY:  Clear to auscultation without rales, wheezing or rhonchi  ABDOMEN: Soft, non-tender, non-distended MUSCULOSKELETAL:  No edema; No deformity  SKIN: Warm and dry NEUROLOGIC:  Alert and oriented x 3 PSYCHIATRIC:  Normal affect    ASSESSMENT AND PLAN:    1. PAF - Was on amiodarone in past. Due to recurrent afib, amiodarone restarted during admission.  - Maintaining sinus rhythm - Continue Eliquis -Reduce amiodarone to 200 mg daily starting 04/01/2021  2. ESRD on HD (Monday Wednesday Friday)  3. Chronic hypotension due to dialysis  - Continue midodrine  4. Hx of cardiac tamponade in 2020 status  post pericardial window - No pericardial effusion on most recent echo 03/2021  Medication Adjustments/Labs and Tests Ordered: Current medicines are reviewed at length with the patient today.  Concerns regarding medicines are outlined above.  Orders Placed This Encounter  Procedures  . EKG 12-Lead   Meds ordered this encounter  Medications  . amiodarone (PACERONE) 200 MG tablet    Sig: Take 1 tablet (200 mg total) by mouth daily.    Dispense:  90 tablet    Refill:  3    Patient Instructions  Medication Instructions:  Your physician has recommended you make the following change in your medication:  On May 1, REDUCE the Amiodarone down to 1 tablet daily    *If you need a refill on your cardiac medications before your next appointment, please call your pharmacy*   Lab Work: None ordered  If you have labs (blood work) drawn today and your tests are completely normal, you will receive your results only by: Marland Kitchen MyChart Message (if you have MyChart) OR . A paper copy in the mail If you have any lab test that is abnormal or we need to change your treatment, we will call you to review the results.   Testing/Procedures: None ordered   Follow-Up: At [facility], you and your health needs are our priority.  As part of our continuing mission to provide you with exceptional heart care, we have created designated Provider Care Teams.  These Care Teams include your primary Cardiologist (physician) and Advanced Practice Providers (APPs -  Physician Assistants and Nurse Practitioners) who all work together to provide  you with the care you need, when you need it.  We recommend signing up for the patient portal called "MyChart".  Sign up information is provided on this After Visit Summary.  MyChart is used to connect with patients for Virtual Visits (Telemedicine).  Patients are able to view lab/test results, encounter notes, upcoming appointments, etc.  Non-urgent messages can be sent to your provider as well.   To learn more about what you can do with MyChart, go to [email or web address].    Your next appointment:   4 month(s)  The format for your next appointment:   In Person  Provider:   You may see Fransico Him, MD or one of the following Advanced Practice Providers on your designated Care Team:    Melina Copa, PA-C  Ermalinda Barrios, PA-C    Other Instructions      Signed, Leanor Kail, [place]  03/28/2021 3:10 PM    [place]

## 2021-03-28 NOTE — Patient Instructions (Addendum)
Medication Instructions:  Your physician has recommended you make the following change in your medication:  On May 1, REDUCE the Amiodarone down to 1 tablet daily    *If you need a refill on your cardiac medications before your next appointment, please call your pharmacy*   Lab Work: None ordered  If you have labs (blood work) drawn today and your tests are completely normal, you will receive your results only by: Marland Kitchen MyChart Message (if you have MyChart) OR . A paper copy in the mail If you have any lab test that is abnormal or we need to change your treatment, we will call you to review the results.   Testing/Procedures: None ordered   Follow-Up: At [facility], you and your health needs are our priority.  As part of our continuing mission to provide you with exceptional heart care, we have created designated Provider Care Teams.  These Care Teams include your primary Cardiologist (physician) and Advanced Practice Providers (APPs -  Physician Assistants and Nurse Practitioners) who all work together to provide you with the care you need, when you need it.  We recommend signing up for the patient portal called "MyChart".  Sign up information is provided on this After Visit Summary.  MyChart is used to connect with patients for Virtual Visits (Telemedicine).  Patients are able to view lab/test results, encounter notes, upcoming appointments, etc.  Non-urgent messages can be sent to your provider as well.   To learn more about what you can do with MyChart, go to [email or web address].    Your next appointment:   4 month(s)  The format for your next appointment:   In Person  Provider:   You may see Fransico Him, MD or one of the following Advanced Practice Providers on your designated Care Team:    Melina Copa, PA-C  Ermalinda Barrios, PA-C    Other Instructions

## 2021-04-23 ENCOUNTER — Encounter: Payer: Self-pay | Admitting: Podiatry

## 2021-04-23 ENCOUNTER — Other Ambulatory Visit: Payer: Self-pay

## 2021-04-23 ENCOUNTER — Ambulatory Visit (INDEPENDENT_AMBULATORY_CARE_PROVIDER_SITE_OTHER): Payer: Medicare Other | Admitting: Podiatry

## 2021-04-23 DIAGNOSIS — E0822 Diabetes mellitus due to underlying condition with diabetic chronic kidney disease: Secondary | ICD-10-CM | POA: Diagnosis not present

## 2021-04-23 DIAGNOSIS — M79675 Pain in left toe(s): Secondary | ICD-10-CM

## 2021-04-23 DIAGNOSIS — N186 End stage renal disease: Secondary | ICD-10-CM

## 2021-04-23 DIAGNOSIS — B351 Tinea unguium: Secondary | ICD-10-CM | POA: Diagnosis not present

## 2021-04-23 DIAGNOSIS — Z992 Dependence on renal dialysis: Secondary | ICD-10-CM

## 2021-04-23 DIAGNOSIS — L84 Corns and callosities: Secondary | ICD-10-CM | POA: Diagnosis not present

## 2021-04-23 DIAGNOSIS — M79674 Pain in right toe(s): Secondary | ICD-10-CM | POA: Diagnosis not present

## 2021-04-23 MED ORDER — AMIODARONE HCL 200 MG PO TABS: 200.0000 mg | ORAL_TABLET | Freq: Every day | ORAL | 3 refills | Status: DC

## 2021-04-26 ENCOUNTER — Encounter: Payer: Self-pay | Admitting: Podiatry

## 2021-04-26 NOTE — Progress Notes (Signed)
  Subjective:  Patient ID: Brenda Arnold, female    DOB: [birth year],  MRN: [id number]  65 y.o. female presents at risk foot care with h/o NIDDM with ESRD on hemodialysis and callus(es) b/l feet and painful thick toenails that are difficult to trim. Painful toenails interfere with ambulation. Aggravating factors include wearing enclosed shoe gear. Pain is relieved with periodic professional debridement. Painful calluses are aggravated when weightbearing with and without shoegear. Pain is relieved with periodic professional debridement.   PCP is Dr. Kelton Pillar and last visit was 09/28/2020.  She states she was hospitalized in April after having chest pain after her dialysis session.  She voices no new pedal concerns today.  Allergies  Allergen Reactions  . Iodine Shortness Of Breath  . Other Shortness Of Breath and Anaphylaxis  . Shellfish Allergy Shortness Of Breath  . Shellfish-Derived Products Shortness Of Breath  . Amlodipine Swelling and Other (See Comments)    Edema  . Felodipine Other (See Comments)    Headache  . Lisinopril Cough    Review of Systems: Negative except as noted in the HPI.   Objective:   Constitutional Pt is a pleasant 65 y.o. African American female in NAD. AAO x 3.   Vascular Capillary fill time to digits <3 seconds b/l lower extremities. Palpable DP pulse(s) b/l lower extremities Nonpalpable PT pulse(s) b/l lower extremities. Pedal hair absent. Lower extremity skin temperature gradient within normal limits. No pain with calf compression b/l.  Neurologic Pt has subjective symptoms of neuropathy. Protective sensation intact 5/5 intact bilaterally with 10g monofilament b/l. Vibratory sensation intact b/l.  Dermatologic Pedal skin with normal turgor, texture and tone bilaterally. No open wounds bilaterally. No interdigital macerations bilaterally. Toenails 1-5 b/l elongated, discolored, dystrophic, thickened, crumbly with subungual debris and tenderness to  dorsal palpation. Hyperkeratotic lesion(s) submet head 5 left foot, submet head 5 right foot and plantar aspect of heel right lower extremity.  No erythema, no edema, no drainage, no fluctuance.  Orthopedic: Normal muscle strength 5/5 to all lower extremity muscle groups bilaterally. No pain crepitus or joint limitation noted with ROM b/l. Hammertoe(s) noted to the 1-5 bilaterally.   Radiographs: None  Hemoglobin A1C Latest Ref Rng & Units 03/18/2021 11/21/2020  HGBA1C 4.8 - 5.6 % 5.2 4.7(L)  Some recent data might be hidden   Assessment:   1. Pain due to onychomycosis of toenails of both feet   2. Callus   3. Diabetes mellitus due to underlying condition with chronic kidney disease on chronic dialysis, without long-term current use of insulin ([place])    Plan:  Patient was evaluated and treated and all questions answered.  Onychomycosis with pain -Nails palliatively debridement as below. -Educated on self-care  Procedure: Nail Debridement Rationale: Pain Type of Debridement: manual, sharp debridement. Instrumentation: Nail nipper, rotary burr. Number of Nails: 10  -Examined patient. -Continue diabetic foot care principles. -Patient to continue soft, supportive shoe gear daily. -Toenails 1-5 b/l were debrided in length and girth with sterile nail nippers and dremel without iatrogenic bleeding.  -Callus(es) submet head 5 left foot, submet head 5 right foot and plantar aspect of heel right lower extremity pared utilizing sterile scalpel blade without complication or incident. Total number debrided =3. -Patient to report any pedal injuries to medical professional immediately. -Patient/POA to call should there be question/concern in the interim.  Return in about 3 months (around 07/24/2021).  Marzetta Board, DPM

## 2021-05-02 DIAGNOSIS — E1122 Type 2 diabetes mellitus with diabetic chronic kidney disease: Secondary | ICD-10-CM | POA: Diagnosis not present

## 2021-05-02 DIAGNOSIS — N186 End stage renal disease: Secondary | ICD-10-CM | POA: Diagnosis not present

## 2021-05-04 DIAGNOSIS — N186 End stage renal disease: Secondary | ICD-10-CM | POA: Diagnosis not present

## 2021-05-04 DIAGNOSIS — E1122 Type 2 diabetes mellitus with diabetic chronic kidney disease: Secondary | ICD-10-CM | POA: Diagnosis not present

## 2021-05-04 DIAGNOSIS — D631 Anemia in chronic kidney disease: Secondary | ICD-10-CM | POA: Diagnosis not present

## 2021-05-04 DIAGNOSIS — N2581 Secondary hyperparathyroidism of renal origin: Secondary | ICD-10-CM | POA: Diagnosis not present

## 2021-05-04 DIAGNOSIS — Z992 Dependence on renal dialysis: Secondary | ICD-10-CM | POA: Diagnosis not present

## 2021-05-31 ENCOUNTER — Ambulatory Visit (INDEPENDENT_AMBULATORY_CARE_PROVIDER_SITE_OTHER): Payer: Medicare Other | Admitting: Orthopaedic Surgery

## 2021-05-31 ENCOUNTER — Ambulatory Visit (INDEPENDENT_AMBULATORY_CARE_PROVIDER_SITE_OTHER): Payer: Medicare Other

## 2021-05-31 ENCOUNTER — Encounter: Payer: Self-pay | Admitting: Orthopaedic Surgery

## 2021-05-31 DIAGNOSIS — Z96642 Presence of left artificial hip joint: Secondary | ICD-10-CM

## 2021-05-31 DIAGNOSIS — I200 Unstable angina: Secondary | ICD-10-CM | POA: Diagnosis not present

## 2021-05-31 NOTE — Progress Notes (Signed)
Post-Op Visit Note   Patient: Brenda Arnold           Date of Birth: 03-11-1956           MRN: [id number] Visit Date: 05/31/2021 PCP: Kelton Pillar, MD   Assessment & Plan:  Chief Complaint:  Chief Complaint  Patient presents with   Left Hip - Pain   Visit Diagnoses:  1. Status post total replacement of left hip     Plan: Patient is a pleasant 65 year old female who comes in today little over 6 months out left total hip replacement 11/23/2020.  She has been doing well.  She is still getting a hot poker sensation feeling to the left hip but this is becoming much less frequent and is only occurring maybe once a month.  Overall, doing well.  Examination of the left hip reveals a painless logroll.  Near full hip flexion.  She is neurovascular intact distally.  At this point, she will continue to advance with activity as tolerated.  Dental prophylaxis reinforced.  Follow-up with [place] in 3 months time for repeat evaluation.  Call with concerns or questions.  Follow-Up Instructions: Return in about 3 months (around 08/31/2021).   Orders:  Orders Placed This Encounter  Procedures   XR HIP UNILAT W OR W/O PELVIS 2-3 VIEWS LEFT   No orders of the defined types were placed in this encounter.   Imaging: XR HIP UNILAT W OR W/O PELVIS 2-3 VIEWS LEFT  Result Date: 05/31/2021 Rays demonstrate a well-seated prosthesis without complication   PMFS History: Patient Active Problem List   Diagnosis Date Noted   Unspecified protein-calorie malnutrition ([place]) 03/21/2021   Morbidly obese ([place]) 03/17/2021   Positive D dimer 03/16/2021   Elevated troponin 03/16/2021   Acquired thrombophilia ([place]) 01/22/2021   Asthma without status asthmaticus 01/22/2021   Chronic diastolic heart failure ([place]) 01/22/2021   Dependence on renal dialysis ([place]) 01/22/2021   Diabetic neuropathy ([place]) 01/22/2021   Diabetic renal disease ([place]) 01/22/2021   Diabetic retinopathy associated with type 2 diabetes  mellitus ([place]) 01/22/2021   Hardening of the aorta (main artery of the heart) ([place]) 01/22/2021   Hearing loss 01/22/2021   Hypertensive heart and chronic kidney disease with heart failure and with stage 5 chronic kidney disease, or end stage renal disease ([place]) 01/22/2021   Pure hypercholesterolemia 01/22/2021   COVID-19 12/07/2020   Encounter for screening for COVID-19 12/07/2020   S/P total left hip arthroplasty 11/25/2020   Primary osteoarthritis of left hip 11/23/2020   Status post total replacement of left hip 11/23/2020   Diarrhea, unspecified 09/06/2020   Headache, unspecified 07/11/2020   Subacute osteomyelitis of right hand ([place]) 01/27/2020   Finger infection 01/26/2020   ESRD (end stage renal disease) ([place]) 01/13/2020   Allergy, unspecified, initial encounter 09/20/2019   ESRD on dialysis [facility])    Physical debility 09/09/2019   Pericardial effusion    SOB (shortness of breath)    Leukocytosis    Anemia of chronic disease    PAF (paroxysmal atrial fibrillation) (HCC)    Postoperative pain    Cardiac tamponade    Chest pain at rest 08/26/2019   Tachycardia 08/26/2019   Pericarditis 08/26/2019   Angina pectoris, unspecified ([place]) 08/25/2019   CAD (coronary artery disease) 11/05/2018   A-fib ([place]) 11/05/2018   Atrial fibrillation with RVR ([place])    Hypotension 11/04/2018   ESRD on hemodialysis ([place]) 11/04/2018   DVT (deep venous thrombosis) ([place]) 11/03/2018   Hyperkalemia  10/02/2018   Status post total replacement of right hip 09/15/2018   Encounter for removal of sutures 05/19/2018   Varicose veins of bilateral lower extremities with other complications 0000000   Hypercalcemia 05/17/2017   Secondary hyperparathyroidism of renal origin ([place]) 02/21/2017   Pruritus, unspecified 02/13/2017   Pain, unspecified 02/13/2017   Other specified coagulation defects ([place]) 02/13/2017   Iron deficiency anemia, unspecified 02/13/2017   Fever, unspecified 02/13/2017    Anticoagulation goal of INR 2 to 3 09/30/2014   Pain in lower limb 05/02/2014   Onychomycosis due to dermatophyte 04/06/2013   Pain in joint, ankle and foot 04/06/2013   Bradycardia 11/06/2012   Left-sided epistaxis 11/05/2012   DM (diabetes mellitus) ([place]) 11/05/2012   OSA (obstructive sleep apnea) 11/05/2012   Acute posterior epistaxis 11/05/2012   CKD (chronic kidney disease) 11/05/2012   End stage renal disease ([place]) 06/25/2012   Type 2 diabetes mellitus with complication, without long-term current use of insulin ([place]) 12/20/2009   OBESITY 12/20/2009   OBESITY-MORBID (>100') 12/20/2009   Essential hypertension 12/20/2009   Paroxysmal atrial fibrillation ([place]) 12/20/2009   Chest pain 12/20/2009   ABNORMAL CV (STRESS) TEST 12/20/2009   Past Medical History:  Diagnosis Date   Anemia    Arthritis    Asthma    Complication of anesthesia    difficulty with getting oxygen saturation up- "patient was not aware"  Bottom of feet burning in PACU   Constipation    because of Iron   Diabetes mellitus    not on meds   DVT (deep venous thrombosis) ([place]) 2019   post hip replacement - right   Dyslipidemia    Epistaxis 11/05/2012   ESRD (end stage renal disease) on dialysis ([place])    "MWF; Jeneen Rinks" (09/15/2018)- started 02/13/2017   History of blood transfusion    HTN (hypertension)    Hyperparathyroidism due to renal insufficiency ([place])    Obesity    s/p panniculectomy   Paroxysmal A-fib ([place])    a. chronic coumadin;  b. 12/2009 Echo: EF 60-65%, Gr 1 DD.   PCOS (polycystic ovarian syndrome)    Pericarditis 08/2019   pericarditis with pericardial effusion, s/p right VATS/pericardial window 08/31/19   Pneumonia    Seasonal allergies    Sleep apnea    a. not using CPAP, last study  >8 yrs   Vitamin D deficiency     Family History  Problem Relation Age of Onset   Lung cancer Father        died @ 48   Hypertension Mother    Diabetes Brother    Kidney disease Brother      Past Surgical History:  Procedure Laterality Date   ABDOMINAL HYSTERECTOMY     with panniculctomy   AV FISTULA PLACEMENT  09/02/2012   Procedure: ARTERIOVENOUS (AV) FISTULA CREATION;  Surgeon: Elam Dutch, MD;  Location: [facility] OR;  Service: Vascular;  Laterality: Left;  Creation of Left Radial-Cephalic Fistula    BREAST SURGERY     Biopsy right breast   COLONOSCOPY     COLONOSCOPY W/ BIOPSIES AND POLYPECTOMY     DILATION AND CURETTAGE OF UTERUS     KNEE ARTHROSCOPY Right    PANNICULECTOMY     REVISON OF ARTERIOVENOUS FISTULA Left 04/27/2014   Procedure: REVISON OF LEFT RADIAL-CEPHALIC ARTERIOVENOUS FISTULA;  Surgeon: Mal Misty, MD;  Location: [place];  Service: Vascular;  Laterality: Left;   REVISON OF ARTERIOVENOUS FISTULA Left 01/13/2020   Procedure: REVISON OF  ARTERIOVENOUS FISTULA;  Surgeon: Marty Heck, MD;  Location: [facility] OR;  Service: Vascular;  Laterality: Left;   TOTAL HIP ARTHROPLASTY Right 09/15/2018   TOTAL HIP ARTHROPLASTY Right 09/15/2018   Procedure: RIGHT TOTAL HIP ARTHROPLASTY ANTERIOR APPROACH;  Surgeon: Leandrew Koyanagi, MD;  Location: [place];  Service: Orthopedics;  Laterality: Right;   TOTAL HIP ARTHROPLASTY Left 11/23/2020   Procedure: LEFT TOTAL HIP ARTHROPLASTY ANTERIOR APPROACH;  Surgeon: Leandrew Koyanagi, MD;  Location: [place];  Service: Orthopedics;  Laterality: Left;  NEEDS RNFA PLEASE   UVULOPLASTY     VIDEO ASSISTED THORACOSCOPY (VATS)/WEDGE RESECTION Right 08/31/2019   Procedure: VIDEO ASSISTED THORACOSCOPY/ DRAINAGE OF PERICARDIAL EFFUSION/ PERICARDIAL WINDOW/ ABORTED PERICARDIOCENTESIS ;  Surgeon: Lajuana Matte, MD;  Location: [place];  Service: Thoracic;  Laterality: Right;   Social History   Occupational History   Occupation: CORRESPONDENT ADMIN.    Employer: [employer]  Tobacco Use   Smoking status: Never   Smokeless tobacco: Never  Vaping Use   Vaping Use: Never used  Substance and Sexual Activity   Alcohol use: No     Alcohol/week: 0.0 standard drinks   Drug use: No   Sexual activity: Not on file    Comment: Hysterectomy

## 2021-06-01 DIAGNOSIS — N186 End stage renal disease: Secondary | ICD-10-CM | POA: Diagnosis not present

## 2021-06-04 DIAGNOSIS — D631 Anemia in chronic kidney disease: Secondary | ICD-10-CM | POA: Diagnosis not present

## 2021-06-04 DIAGNOSIS — N2581 Secondary hyperparathyroidism of renal origin: Secondary | ICD-10-CM | POA: Diagnosis not present

## 2021-06-04 DIAGNOSIS — Z23 Encounter for immunization: Secondary | ICD-10-CM | POA: Diagnosis not present

## 2021-06-04 DIAGNOSIS — N186 End stage renal disease: Secondary | ICD-10-CM | POA: Diagnosis not present

## 2021-06-04 DIAGNOSIS — Z992 Dependence on renal dialysis: Secondary | ICD-10-CM | POA: Diagnosis not present

## 2021-07-02 DIAGNOSIS — N186 End stage renal disease: Secondary | ICD-10-CM | POA: Diagnosis not present

## 2021-07-04 DIAGNOSIS — Z992 Dependence on renal dialysis: Secondary | ICD-10-CM | POA: Diagnosis not present

## 2021-07-04 DIAGNOSIS — D631 Anemia in chronic kidney disease: Secondary | ICD-10-CM | POA: Diagnosis not present

## 2021-07-04 DIAGNOSIS — N186 End stage renal disease: Secondary | ICD-10-CM | POA: Diagnosis not present

## 2021-07-04 DIAGNOSIS — N2581 Secondary hyperparathyroidism of renal origin: Secondary | ICD-10-CM | POA: Diagnosis not present

## 2021-07-17 ENCOUNTER — Other Ambulatory Visit: Payer: Self-pay | Admitting: Family

## 2021-07-17 ENCOUNTER — Other Ambulatory Visit: Payer: Self-pay | Admitting: Adult Health Nurse Practitioner

## 2021-07-27 ENCOUNTER — Encounter: Payer: Self-pay | Admitting: Podiatry

## 2021-07-27 ENCOUNTER — Ambulatory Visit (INDEPENDENT_AMBULATORY_CARE_PROVIDER_SITE_OTHER): Payer: Medicare Other | Admitting: Podiatry

## 2021-07-27 ENCOUNTER — Other Ambulatory Visit: Payer: Self-pay

## 2021-07-27 DIAGNOSIS — L853 Xerosis cutis: Secondary | ICD-10-CM

## 2021-07-27 DIAGNOSIS — Z992 Dependence on renal dialysis: Secondary | ICD-10-CM | POA: Diagnosis not present

## 2021-07-27 DIAGNOSIS — M79675 Pain in left toe(s): Secondary | ICD-10-CM | POA: Diagnosis not present

## 2021-07-27 DIAGNOSIS — N186 End stage renal disease: Secondary | ICD-10-CM

## 2021-07-27 DIAGNOSIS — E0822 Diabetes mellitus due to underlying condition with diabetic chronic kidney disease: Secondary | ICD-10-CM

## 2021-07-27 DIAGNOSIS — B351 Tinea unguium: Secondary | ICD-10-CM

## 2021-07-27 DIAGNOSIS — L84 Corns and callosities: Secondary | ICD-10-CM | POA: Diagnosis not present

## 2021-07-27 DIAGNOSIS — M79674 Pain in right toe(s): Secondary | ICD-10-CM

## 2021-07-27 MED ORDER — AMMONIUM LACTATE 12 % EX LOTN: 1.0000 "application " | TOPICAL_LOTION | Freq: Two times a day (BID) | CUTANEOUS | 4 refills | Status: DC

## 2021-07-30 ENCOUNTER — Other Ambulatory Visit: Payer: Self-pay | Admitting: Cardiology

## 2021-07-30 ENCOUNTER — Ambulatory Visit (INDEPENDENT_AMBULATORY_CARE_PROVIDER_SITE_OTHER): Payer: Medicare Other | Admitting: Cardiology

## 2021-07-30 ENCOUNTER — Other Ambulatory Visit: Payer: Self-pay

## 2021-07-30 VITALS — BP 98/72 | HR 60 | Ht 66.5 in | Wt 236.6 lb

## 2021-07-30 DIAGNOSIS — I319 Disease of pericardium, unspecified: Secondary | ICD-10-CM

## 2021-07-30 DIAGNOSIS — I2584 Coronary atherosclerosis due to calcified coronary lesion: Secondary | ICD-10-CM | POA: Diagnosis not present

## 2021-07-30 DIAGNOSIS — I200 Unstable angina: Secondary | ICD-10-CM

## 2021-07-30 DIAGNOSIS — E7800 Pure hypercholesterolemia, unspecified: Secondary | ICD-10-CM | POA: Diagnosis not present

## 2021-07-30 DIAGNOSIS — I32 Pericarditis in diseases classified elsewhere: Secondary | ICD-10-CM

## 2021-07-30 DIAGNOSIS — I480 Paroxysmal atrial fibrillation: Secondary | ICD-10-CM | POA: Diagnosis not present

## 2021-07-30 DIAGNOSIS — E1169 Type 2 diabetes mellitus with other specified complication: Secondary | ICD-10-CM | POA: Diagnosis not present

## 2021-07-30 DIAGNOSIS — N186 End stage renal disease: Secondary | ICD-10-CM | POA: Diagnosis not present

## 2021-07-30 DIAGNOSIS — I2510 Atherosclerotic heart disease of native coronary artery without angina pectoris: Secondary | ICD-10-CM

## 2021-07-30 MED ORDER — ELIQUIS 5 MG PO TABS
5.0000 mg | ORAL_TABLET | Freq: Two times a day (BID) | ORAL | 3 refills | Status: DC
Start: 2021-07-30 — End: 2022-04-18

## 2021-07-30 MED ORDER — AMIODARONE HCL 200 MG PO TABS: 200.0000 mg | ORAL_TABLET | Freq: Every day | ORAL | 3 refills | Status: DC

## 2021-07-30 NOTE — Addendum Note (Signed)
Addended by: Antonieta Iba on: 07/30/2021 11:20 AM   Modules accepted: Orders

## 2021-07-30 NOTE — Progress Notes (Signed)
Cardiology Office Note:    Date:  07/30/2021   ID:  Brenda Arnold, DOB 11-08-1956, MRN [id number]  PCP:  Kelton Pillar, MD  Cardiologist:  Fransico Him, MD    Referring MD: Kelton Pillar, MD   Chief Complaint  Patient presents with   Follow-up    Pericarditis, ESRD, PAF, DM, HLD    History of Present Illness:    Brenda Arnold is a 65 y.o. female with a hx of hypertension, PAF, ESRD on HD, and obesity.  She had a Myoview secondary to chest pain in the setting of atrial fibrillation 2011 that showed inferolateral lateral ischemia.  Heart catheterization was deferred and medical therapy was recommended.  She underwent Myoview stress test on 04/17/17 which showed no reversible ischemia.      The patient was seen in the hospital in early October 2020 with symptoms consistent with acute pericarditis  (pleuritc chest pain, elevated inflammatory markers, diffuse ST elevation on EKG), treated with colchicine.  Initial TTE with trivial effusion.  On 9/29 developed hypotension and repeat TTE showed large effusion with findings concerning for tamponade.  S/p pericardial window with Dr Kipp Brood on 9/29.  Cultures no growth, cytology with no malignant cells. Continued colchicine with HD.  She developed atrial fibrillation with RVR and was started on amiodarone for rhythm control and risks for stroke risk reduction.  She was maintaining sinus rhythm at the time of discharge.  She went to inpatient rehab.   She went to the ED on 10/06/2019 with complaints of pleuritic chest pain.  There were no ischemic changes on EKG and troponins were normal.  She was not felt to be volume overloaded and potassium was normal.  Seen back in the office by extender and Bp was stable on Midodrine and CP with breathing had improved but still had some pleuritic CP with deep inspiration.  She was continued on Colchicine.  She was maintaining NSR on Amio and midodrine for hypotension on HD.   I saw her 01/2020 and her  Amio was stopped as she was maintaining NSR.  Repeat 2D echo 01/2020 showed normal LVF and no pericardia effusion.  She was seen in afib clinic 05/2020 due to palpitations and recurrent afib but was back in NSR during the OV.  Unfortunately it was felt that Amio was only option given her ESRD and she was restarted on Amio '200mg'$  BID.    She was hospitalized this past April with afib with RVR and underwent DCCV and placed back on Amio '200mg'$  BID which had been stopped but Afib clinic.  She has done well since then.    She is here today for followup and is doing well.  She denies any chest pain or pressure, SOB, DOE, PND, orthopnea, LE edema, dizziness, palpitations or syncope. She is compliant with her meds and is tolerating meds with no SE.     Past Medical History:  Diagnosis Date   Anemia    Arthritis    Asthma    Complication of anesthesia    difficulty with getting oxygen saturation up- "patient was not aware"  Bottom of feet burning in PACU   Constipation    because of Iron   Diabetes mellitus    not on meds   DVT (deep venous thrombosis) ([place]) 2019   post hip replacement - right   Dyslipidemia    Epistaxis 11/05/2012   ESRD (end stage renal disease) on dialysis [facility])    "MWF; Jeneen Rinks" (09/15/2018)- started 02/13/2017  History of blood transfusion    HTN (hypertension)    Hyperparathyroidism due to renal insufficiency (HCC)    Obesity    s/p panniculectomy   Paroxysmal A-fib (HCC)    a. chronic coumadin;  b. 12/2009 Echo: EF 60-65%, Gr 1 DD.   PCOS (polycystic ovarian syndrome)    Pericarditis 08/2019   pericarditis with pericardial effusion, s/p right VATS/pericardial window 08/31/19   Pneumonia    Seasonal allergies    Sleep apnea    a. not using CPAP, last study  >8 yrs   Vitamin D deficiency     Past Surgical History:  Procedure Laterality Date   ABDOMINAL HYSTERECTOMY     with panniculctomy   AV FISTULA PLACEMENT  09/02/2012   Procedure: ARTERIOVENOUS (AV) FISTULA  CREATION;  Surgeon: Elam Dutch, MD;  Location: [place];  Service: Vascular;  Laterality: Left;  Creation of Left Radial-Cephalic Fistula    BREAST SURGERY     Biopsy right breast   COLONOSCOPY     COLONOSCOPY W/ BIOPSIES AND POLYPECTOMY     DILATION AND CURETTAGE OF UTERUS     KNEE ARTHROSCOPY Right    PANNICULECTOMY     REVISON OF ARTERIOVENOUS FISTULA Left 04/27/2014   Procedure: REVISON OF LEFT RADIAL-CEPHALIC ARTERIOVENOUS FISTULA;  Surgeon: Mal Misty, MD;  Location: [place];  Service: Vascular;  Laterality: Left;   REVISON OF ARTERIOVENOUS FISTULA Left 01/13/2020   Procedure: REVISON OF ARTERIOVENOUS FISTULA;  Surgeon: Marty Heck, MD;  Location[facility] OR;  Service: Vascular;  Laterality: Left;   TOTAL HIP ARTHROPLASTY Right 09/15/2018   TOTAL HIP ARTHROPLASTY Right 09/15/2018   Procedure: RIGHT TOTAL HIP ARTHROPLASTY ANTERIOR APPROACH;  Surgeon: Leandrew Koyanagi, MD;  Location: [place];  Service: Orthopedics;  Laterality: Right;   TOTAL HIP ARTHROPLASTY Left 11/23/2020   Procedure: LEFT TOTAL HIP ARTHROPLASTY ANTERIOR APPROACH;  Surgeon: Leandrew Koyanagi, MD;  Location: [place];  Service: Orthopedics;  Laterality: Left;  NEEDS RNFA PLEASE   UVULOPLASTY     VIDEO ASSISTED THORACOSCOPY (VATS)/WEDGE RESECTION Right 08/31/2019   Procedure: VIDEO ASSISTED THORACOSCOPY/ DRAINAGE OF PERICARDIAL EFFUSION/ PERICARDIAL WINDOW/ ABORTED PERICARDIOCENTESIS ;  Surgeon: Lajuana Matte, MD;  Location: [facility] OR;  Service: Thoracic;  Laterality: Right;    Current Medications: Current Meds  Medication Sig   amiodarone (PACERONE) 200 MG tablet Take 1 tablet (200 mg total) by mouth daily.   B Complex-C-Folic Acid (DIALYVITE TABLET) TABS Take 1 tablet by mouth daily.   bisacodyl (DULCOLAX) 5 MG EC tablet Take 5 mg by mouth daily as needed for moderate constipation.   colchicine 0.6 MG tablet Take 0.5 tablets (0.3 mg total) by mouth every Monday, Wednesday, and Friday.   docusate sodium (COLACE) 100  MG capsule Take 1 capsule (100 mg total) by mouth daily as needed.   Doxercalciferol (HECTOROL IV) Doxercalciferol (Hectorol)   ELIQUIS 5 MG TABS tablet Take 5 mg by mouth 2 (two) times daily.    Etelcalcetide HCl (PARSABIV IV) Etelcalcetide (Parsabiv)   ethyl chloride spray SMARTSIG:Sparingly Topical 3 Times Daily   fluticasone (FLONASE) 50 MCG/ACT nasal spray Place 2 sprays into both nostrils daily.   gabapentin (NEURONTIN) 100 MG capsule Take 100 mg by mouth at bedtime.   loratadine (CLARITIN) 10 MG tablet Take 1 tablet (10 mg total) by mouth daily.   Methoxy PEG-Epoetin Beta (MIRCERA IJ) Mircera   midodrine (PROAMATINE) 10 MG tablet Take 1 tablet (10 mg total) by mouth 3 (three) times daily with  meals.   sucroferric oxyhydroxide (VELPHORO) 500 MG chewable tablet Chew 2 tablets (1,000 mg total) by mouth 3 (three) times daily with meals.     Allergies:   Iodine, Other, Shellfish allergy, Shellfish-derived products, Amlodipine, Felodipine, and Lisinopril   Social History   Socioeconomic History   Marital status: Single    Spouse name: Not on file   Number of children: Not on file   Years of education: Not on file   Highest education level: Not on file  Occupational History   Occupation: CORRESPONDENT ADMIN.    Employer: [employer]  Tobacco Use   Smoking status: Never   Smokeless tobacco: Never  Vaping Use   Vaping Use: Never used  Substance and Sexual Activity   Alcohol use: No    Alcohol/week: 0.0 standard drinks   Drug use: No   Sexual activity: Not on file    Comment: Hysterectomy  Other Topics Concern   Not on file  Social History Narrative   Lives in [place] alone. 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   Social Determinants of Health   Financial Resource Strain: Not on file  Food Insecurity: Not on file  Transportation Needs: Not on file  Physical Activity: Not on file  Stress: Not on file  Social Connections: Not on file      Family History: The patient's family history includes Diabetes in her brother; Hypertension in her mother; Kidney disease in her brother; Lung cancer in her father.  ROS:   Please see the history of present illness.    ROS  All other systems reviewed and negative.   EKGs/Labs/Other Studies Reviewed:    The following studies were reviewed today: 2D echo 01/2020 IMPRESSIONS    1. Left ventricular ejection fraction, by estimation, is 70 to 75%. The  left ventricle has hyperdynamic function. Left ventricular diastolic  parameters are indeterminate.   2. Right ventricular systolic function is normal. The right ventricular  size is normal. There is normal pulmonary artery systolic pressure.   3. The mitral valve is normal in structure and function. Trivial mitral  valve regurgitation.   4. The aortic valve is abnormal. Mild to moderate aortic valve  sclerosis/calcification is present, without any evidence of aortic  stenosis.   5. The inferior vena cava is normal in size with greater than 50%  respiratory variability, suggesting right atrial pressure of 3 mmHg  EKG:  EKG is not ordered today  Recent Labs: 03/16/2021: TSH 1.471 03/18/2021: ALT 22; BUN 47; Creatinine, Ser 8.97; Hemoglobin 10.2; Magnesium 2.0; Platelets 158; Potassium 4.4; Sodium 138   Recent Lipid Panel    Component Value Date/Time   CHOL 164 03/18/2021 0112   TRIG 75 03/18/2021 0112   HDL 46 03/18/2021 0112   CHOLHDL 3.6 03/18/2021 0112   VLDL 15 03/18/2021 0112   LDLCALC 103 (H) 03/18/2021 0112    Physical Exam:    VS:  BP 98/72   Pulse 60   Ht 5' 6.5" (1.689 m)   Wt 236 lb 9.6 oz (107.3 kg)   BMI 37.62 kg/m     Wt Readings from Last 3 Encounters:  07/30/21 236 lb 9.6 oz (107.3 kg)  03/28/21 238 lb 3.2 oz (108 kg)  03/18/21 235 lb 0.2 oz (106.6 kg)    GEN: Well nourished, well developed in no acute distress HEENT: Normal NECK: No JVD; No carotid bruits LYMPHATICS: No  lymphadenopathy CARDIAC:RRR, no murmurs, rubs, gallops RESPIRATORY:  Clear to auscultation without  rales, wheezing or rhonchi  ABDOMEN: Soft, non-tender, non-distended MUSCULOSKELETAL:  No edema; No deformity  SKIN: Warm and dry NEUROLOGIC:  Alert and oriented x 3 PSYCHIATRIC:  Normal affect   ASSESSMENT:    1. Chronic pericarditis associated with other disease, unspecified complication status   2. PAF (paroxysmal atrial fibrillation) ([place])   3. End stage renal disease ([place])   4. Type 2 diabetes mellitus with other specified complication, without long-term current use of insulin ([place])   5. Pure hypercholesterolemia    PLAN:    In order of problems listed above:  1 . Acute pericarditis -s/p pericardial tamonade with pericardial window -Cultures NGTD, cytology with no malignant cells -repeat 2D 01/2020 with no pericardial effusion -she has not had any further CP -likely secondary to uremic pericarditis -continue chronic colchicine 0.'3mg'$  Mon/Wed/Fri  2.  PAF -she had recurrent PAF in April requiring DCCV and started back on Amio '200mg'$  daily as it was felt to be only option in setting of ESRD -she is maintaining NSR on exam -denies any bleeding problems on DOAC -I have personally reviewed and interpreted outside labs performed by patient's PCP which showed Hbg 10.2 in April 2022 -Continue prescription drug management with Eliquis '5mg'$  BID > refilled  3.  ESRD -on HD -has some problems with Hypotension at HD which is stable on Midodrine -Continue prescription drug management with Midodrine '10mg'$  TID   4.  Type 2 DM -followed by PCP -HbA1C 4.7% in Dec 2021  5.  HLD -LDL goal < 70 -I have personally reviewed and interpreted outside labs performed by patient's PCP which showed LDL 103, HDL 46 and ALT 22 in April 2022  -patient has refused statin therapy  6.  Coronary artery calcifications -extensive coronary Ca showed on Chest CT in 2020 -I will get a coronary Ca score to  determine risk -if high then will need referral to lipid clinic for [facility]  Medication Adjustments/Labs and Tests Ordered: Current medicines are reviewed at length with the patient today.  Concerns regarding medicines are outlined above.  No orders of the defined types were placed in this encounter.  No orders of the defined types were placed in this encounter.   Signed, Fransico Him, MD  07/30/2021 11:08 AM    [place]

## 2021-07-30 NOTE — Patient Instructions (Signed)
Medication Instructions:  Your physician recommends that you continue on your current medications as directed. Please refer to the Current Medication list given to you today.  *If you need a refill on your cardiac medications before your next appointment, please call your pharmacy*    Testing/Procedures: Your provider has recommended that you have a calcium score CT scan.    Follow-Up: At [facility], you and your health needs are our priority.  As part of our continuing mission to provide you with exceptional heart care, we have created designated Provider Care Teams.  These Care Teams include your primary Cardiologist (physician) and Advanced Practice Providers (APPs -  Physician Assistants and Nurse Practitioners) who all work together to provide you with the care you need, when you need it.  Your next appointment:   1 year(s)  The format for your next appointment:   In Person  Provider:   You may see Fransico Him, MD or one of the following Advanced Practice Providers on your designated Care Team:   Melina Copa, PA-C Ermalinda Barrios, PA-C

## 2021-08-01 NOTE — Progress Notes (Signed)
Subjective:  Patient ID: Brenda Arnold, female    DOB: May 06, 1956,  MRN: [id number]  65 y.o. female presents at risk foot care with h/o NIDDM with ESRD on hemodialysis and callus(es) b/l feet and painful thick toenails that are difficult to trim. Painful toenails interfere with ambulation. Aggravating factors include wearing enclosed shoe gear. Pain is relieved with periodic professional debridement. Painful calluses are aggravated when weightbearing with and without shoegear. Pain is relieved with periodic professional debridement.   She is requesting a moisturizer for her dry feet on today's visit.  PCP is Dr. Kelton Pillar and last visit was "a couple of months ago."  She voices no new pedal problems on today's visit.  She voices no new pedal concerns today.  Allergies  Allergen Reactions   Iodine Shortness Of Breath   Other Shortness Of Breath and Anaphylaxis   Shellfish Allergy Shortness Of Breath    Other reaction(s): Breathing Problems   Shellfish-Derived Products Shortness Of Breath   Amlodipine Swelling and Other (See Comments)    Edema   Felodipine Other (See Comments)    Headache   Lisinopril Cough    Review of Systems: Negative except as noted in the HPI.   Objective:   Constitutional Pt is a pleasant 65 y.o. African American female in NAD. AAO x 3.   Vascular Capillary fill time to digits <3 seconds b/l lower extremities. Palpable DP pulse(s) b/l lower extremities Nonpalpable PT pulse(s) b/l lower extremities. Pedal hair absent. Lower extremity skin temperature gradient within normal limits. No pain with calf compression b/l.  Neurologic Pt has subjective symptoms of neuropathy. Protective sensation intact 5/5 intact bilaterally with 10g monofilament b/l. Vibratory sensation intact b/l.  Dermatologic Skin warm and supple b/l feet. No open wounds b/l lower extremities. No interdigital macerations b/l lower extremities. Toenails 1-5 b/l elongated, discolored,  dystrophic, thickened, crumbly with subungual debris and tenderness to dorsal palpation. Hyperkeratotic lesion(s) submet head 5 left foot, submet head 5 right foot, and plantar aspect of heel right foot.  No erythema, no edema, no drainage, no fluctuance. Pedal skin noted to be dry and flaky b/l lower extremities.  Orthopedic: Normal muscle strength 5/5 to all lower extremity muscle groups bilaterally. No pain crepitus or joint limitation noted with ROM b/l. Hammertoe(s) noted to the 1-5 bilaterally.   Radiographs: None  Hemoglobin A1C Latest Ref Rng & Units 03/18/2021 11/21/2020  HGBA1C 4.8 - 5.6 % 5.2 4.7(L)  Some recent data might be hidden   Assessment:   1. Pain due to onychomycosis of toenails of both feet   2. Callus   3. Xerosis cutis   4. Diabetes mellitus due to underlying condition with chronic kidney disease on chronic dialysis, without long-term current use of insulin ([place])     Plan:  -No new findings. No new orders. -Continue diabetic foot care principles: inspect feet daily, monitor glucose as recommended by PCP and/or Endocrinologist, and follow prescribed diet per PCP, Endocrinologist and/or dietician. -Patient to continue soft, supportive shoe gear daily. -Toenails 1-5 b/l were debrided in length and girth with sterile nail nippers and dremel without iatrogenic bleeding.  -Callus(es) submet head 5 left foot, submet head 5 right foot, and plantar aspect of heel right foot pared utilizing sterile scalpel blade without complication or incident. Total number debrided =3. -Patient to report any pedal injuries to medical professional immediately. -For dry skin, Rx sent to pharmacy for ammonium lactate lotion 2% to be applied to feet twice daily for xerosis -Patient/POA to  call should there be question/concern in the interim.  Return in about 3 months (around 10/27/2021).  Marzetta Board, DPM

## 2021-08-14 ENCOUNTER — Other Ambulatory Visit: Payer: Self-pay

## 2021-08-14 MED ORDER — COLCHICINE 0.6 MG PO TABS: 0.3000 mg | ORAL_TABLET | ORAL | 1 refills | Status: DC

## 2021-08-15 ENCOUNTER — Other Ambulatory Visit: Payer: Self-pay | Admitting: *Deleted

## 2021-08-15 MED ORDER — COLCHICINE 0.6 MG PO TABS: 0.3000 mg | ORAL_TABLET | ORAL | 1 refills | Status: DC

## 2021-08-23 ENCOUNTER — Ambulatory Visit (INDEPENDENT_AMBULATORY_CARE_PROVIDER_SITE_OTHER)
Admission: RE | Admit: 2021-08-23 | Discharge: 2021-08-23 | Disposition: A | Discharge status: Home / Self Care | Payer: Self-pay | Source: Ambulatory Visit | Attending: Cardiology | Admitting: Cardiology

## 2021-08-23 ENCOUNTER — Encounter: Payer: Self-pay | Admitting: Cardiology

## 2021-08-23 ENCOUNTER — Other Ambulatory Visit: Payer: Self-pay

## 2021-08-23 DIAGNOSIS — I480 Paroxysmal atrial fibrillation: Secondary | ICD-10-CM

## 2021-08-23 DIAGNOSIS — R931 Abnormal findings on diagnostic imaging of heart and coronary circulation: Secondary | ICD-10-CM | POA: Insufficient documentation

## 2021-08-24 ENCOUNTER — Telehealth: Payer: Self-pay | Admitting: Nurse Practitioner

## 2021-08-24 DIAGNOSIS — I2510 Atherosclerotic heart disease of native coronary artery without angina pectoris: Secondary | ICD-10-CM

## 2021-08-24 MED ORDER — ATORVASTATIN CALCIUM 40 MG PO TABS: 40.0000 mg | ORAL_TABLET | Freq: Every day | ORAL | 3 refills | Status: DC

## 2021-08-24 NOTE — Telephone Encounter (Signed)
-----   Message from Sueanne Margarita, MD sent at 08/23/2021 10:10 PM EDT ----- Markedly elevated coronary Ca score in all e epicardial vessels.  Please find out if she is currently taking a statin drug.  No ASA due to DOAC.  Please get Lexsican myoview to rule out ischemia.

## 2021-08-24 NOTE — Telephone Encounter (Signed)
Reviewed calcium score results with patient who verbalized understanding. She states she requested a refill of atorvastatin several months ago and never received the medication so she thought she was supposed to stop it. I advised that I am sending a refill of atorvastatin now and asked her to restart it to which she agrees. She states she had a lexiscan myvoiew several years ago (2018) and she was not able to keep her arms above her head while being scanned.  I asked her about walking on a treadmill and she states she could probably walk for a short time but has not done so recently.  She states it was very painful. I advised that I will forward message to Dr. Radford Pax for review and that someone from our office will call her back next week.Patient thanked me for the call.

## 2021-08-27 NOTE — Telephone Encounter (Signed)
Left message for patient to call back for additional information about the test Dr. Radford Pax would like to order.

## 2021-08-28 ENCOUNTER — Other Ambulatory Visit: Payer: Self-pay | Admitting: Nurse Practitioner

## 2021-08-28 DIAGNOSIS — R931 Abnormal findings on diagnostic imaging of heart and coronary circulation: Secondary | ICD-10-CM

## 2021-08-28 NOTE — Progress Notes (Signed)
Shared Decision Making/Informed Consent The risks [chest pain, shortness of breath, cardiac arrhythmias, dizziness, blood pressure fluctuations, myocardial infarction, stroke/transient ischemic attack, nausea, vomiting, allergic reaction, radiation exposure, metallic taste sensation and life-threatening complications (estimated to be 1 in 10,000)], benefits (risk stratification, diagnosing coronary artery disease, treatment guidance) and alternatives of a nuclear stress test were discussed in detail with Brenda Arnold and she agrees to proceed.

## 2021-08-28 NOTE — Progress Notes (Signed)
Attestation for [employer]

## 2021-08-28 NOTE — Telephone Encounter (Signed)
Spoke with patient and advised her of the change in equipment that will allow her not to have her arms raised during the myoview. She verbalized understanding and agreement to proceed with scheduling. Questions were answered to her satisfaction and she thanked me for the call.

## 2021-08-31 ENCOUNTER — Ambulatory Visit: Payer: BC Managed Care – PPO | Admitting: Orthopaedic Surgery

## 2021-09-06 ENCOUNTER — Other Ambulatory Visit: Payer: Self-pay | Admitting: Cardiology

## 2021-09-06 NOTE — Telephone Encounter (Signed)
Pt's pharmacy is requesting a refill on colchicine. Would Dr. Turner like to refill this medication? Please address 

## 2021-09-17 ENCOUNTER — Telehealth (HOSPITAL_COMMUNITY): Payer: Self-pay

## 2021-09-17 NOTE — Telephone Encounter (Signed)
Detailed instructions left on the patient's answering machine. Asked to call back with any questions.

## 2021-09-18 ENCOUNTER — Other Ambulatory Visit: Payer: Self-pay

## 2021-09-18 ENCOUNTER — Ambulatory Visit (HOSPITAL_COMMUNITY): Payer: Medicare Other | Attending: Cardiovascular Disease

## 2021-09-18 DIAGNOSIS — I2510 Atherosclerotic heart disease of native coronary artery without angina pectoris: Secondary | ICD-10-CM | POA: Insufficient documentation

## 2021-09-18 DIAGNOSIS — I2584 Coronary atherosclerosis due to calcified coronary lesion: Secondary | ICD-10-CM | POA: Insufficient documentation

## 2021-09-18 MED ORDER — TECHNETIUM TC 99M TETROFOSMIN IV KIT
31.5000 | PACK | Freq: Once | INTRAVENOUS | Status: AC | PRN
  Administered 2021-09-18: 31.5 via INTRAVENOUS
  Filled 2021-09-18: qty 32

## 2021-09-18 MED ORDER — REGADENOSON 0.4 MG/5ML IV SOLN
0.4000 mg | Freq: Once | INTRAVENOUS | Status: AC
  Administered 2021-09-18: 0.4 mg via INTRAVENOUS

## 2021-09-20 ENCOUNTER — Ambulatory Visit (HOSPITAL_COMMUNITY): Payer: Medicare Other | Attending: Internal Medicine

## 2021-09-20 ENCOUNTER — Other Ambulatory Visit: Payer: Self-pay

## 2021-09-20 LAB — MYOCARDIAL PERFUSION IMAGING
LV dias vol: 53 mL (ref 46–106)
LV sys vol: 9 mL
Nuc Stress EF: 83 %
Peak HR: 85 {beats}/min
Rest HR: 66 {beats}/min
Rest Nuclear Isotope Dose: 31.7 mCi
SDS: 7
SRS: 4
SSS: 12
ST Depression (mm): 0 mm
Stress Nuclear Isotope Dose: 31.5 mCi
TID: 1.5

## 2021-09-20 MED ORDER — TECHNETIUM TC 99M TETROFOSMIN IV KIT
31.7000 | PACK | Freq: Once | INTRAVENOUS | Status: AC | PRN
  Administered 2021-09-20: 31.7 via INTRAVENOUS
  Filled 2021-09-20: qty 32

## 2021-11-02 ENCOUNTER — Ambulatory Visit (INDEPENDENT_AMBULATORY_CARE_PROVIDER_SITE_OTHER): Payer: Medicare Other | Admitting: Podiatry

## 2021-11-02 ENCOUNTER — Other Ambulatory Visit: Payer: Self-pay

## 2021-11-02 ENCOUNTER — Encounter: Payer: Self-pay | Admitting: Podiatry

## 2021-11-02 DIAGNOSIS — M2041 Other hammer toe(s) (acquired), right foot: Secondary | ICD-10-CM | POA: Diagnosis not present

## 2021-11-02 DIAGNOSIS — L84 Corns and callosities: Secondary | ICD-10-CM

## 2021-11-02 DIAGNOSIS — E0822 Diabetes mellitus due to underlying condition with diabetic chronic kidney disease: Secondary | ICD-10-CM | POA: Diagnosis not present

## 2021-11-02 DIAGNOSIS — B351 Tinea unguium: Secondary | ICD-10-CM

## 2021-11-02 DIAGNOSIS — N186 End stage renal disease: Secondary | ICD-10-CM | POA: Diagnosis not present

## 2021-11-02 DIAGNOSIS — M79674 Pain in right toe(s): Secondary | ICD-10-CM

## 2021-11-02 DIAGNOSIS — Z992 Dependence on renal dialysis: Secondary | ICD-10-CM | POA: Diagnosis not present

## 2021-11-02 DIAGNOSIS — M2042 Other hammer toe(s) (acquired), left foot: Secondary | ICD-10-CM

## 2021-11-02 DIAGNOSIS — E119 Type 2 diabetes mellitus without complications: Secondary | ICD-10-CM

## 2021-11-02 DIAGNOSIS — M79675 Pain in left toe(s): Secondary | ICD-10-CM

## 2021-11-02 NOTE — Progress Notes (Signed)
ANNUAL DIABETIC FOOT EXAM  Subjective: Brenda Arnold presents today for for annual diabetic foot examination, at risk foot care with h/o NIDDM with ESRD on hemodialysis, and callus(es) bilateral feet and painful thick toenails that are difficult to trim. Painful toenails interfere with ambulation. Aggravating factors include wearing enclosed shoe gear. Pain is relieved with periodic professional debridement. Painful calluses are aggravated when weightbearing with and without shoegear. Pain is relieved with periodic professional debridement.  Patient relates 20 year h/o diabetes. She has h/o ESRD and is on hemodialysis on TTS.  Patient denies any h/o foot wounds.  Patient has been diagnosed with neuropathy and it is managed with gabapentin.  Patient does not monitor blood glucose daily.  Kelton Pillar, MD is patient's PCP. Last visit was October, 2022.  Brenda Arnold states she enjoyed Thanksgiving holiday and her mother visiting her from [place]. She notes no new pedal concerns on today's visit.  Past Medical History:  Diagnosis Date   Agatston coronary artery calcium score greater than 400    Ca score 3824 on CT 08/2021   Anemia    Arthritis    Asthma    Complication of anesthesia    difficulty with getting oxygen saturation up- "patient was not aware"  Bottom of feet burning in PACU   Constipation    because of Iron   Diabetes mellitus    not on meds   DVT (deep venous thrombosis) ([place]) 2019   post hip replacement - right   Dyslipidemia    Epistaxis 11/05/2012   ESRD (end stage renal disease) on dialysis ([place])    "MWF; [facility]" (09/15/2018)- started 02/13/2017   History of blood transfusion    HTN (hypertension)    Hyperparathyroidism due to renal insufficiency (HCC)    Obesity    s/p panniculectomy   Paroxysmal A-fib ([place])    a. chronic coumadin;  b. 12/2009 Echo: EF 60-65%, Gr 1 DD.   PCOS (polycystic ovarian syndrome)    Pericarditis 08/2019   pericarditis  with pericardial effusion, s/p right VATS/pericardial window 08/31/19   Pneumonia    Seasonal allergies    Sleep apnea    a. not using CPAP, last study  >8 yrs   Vitamin D deficiency    Patient Active Problem List   Diagnosis Date Noted   Agatston coronary artery calcium score greater than 400 08/23/2021   Unspecified protein-calorie malnutrition ([place]) 03/21/2021   Morbidly obese ([place]) 03/17/2021   Positive D dimer 03/16/2021   Elevated troponin 03/16/2021   Acquired thrombophilia ([place]) 01/22/2021   Asthma without status asthmaticus 01/22/2021   Chronic diastolic heart failure ([place]) 01/22/2021   Dependence on renal dialysis ([place]) 01/22/2021   Diabetic neuropathy ([place]) 01/22/2021   Diabetic renal disease ([place]) 01/22/2021   Diabetic retinopathy associated with type 2 diabetes mellitus ([place]) 01/22/2021   Hardening of the aorta (main artery of the heart) ([place]) 01/22/2021   Hearing loss 01/22/2021   Hypertensive heart and chronic kidney disease with heart failure and with stage 5 chronic kidney disease, or end stage renal disease ([place]) 01/22/2021   Pure hypercholesterolemia 01/22/2021   COVID-19 12/07/2020   Encounter for screening for COVID-19 12/07/2020   S/P total left hip arthroplasty 11/25/2020   Primary osteoarthritis of left hip 11/23/2020   Status post total replacement of left hip 11/23/2020   Diarrhea, unspecified 09/06/2020   Headache, unspecified 07/11/2020   Subacute osteomyelitis of right hand ([place]) 01/27/2020   Finger infection 01/26/2020   ESRD (end stage  renal disease) ([place]) 01/13/2020   Allergy, unspecified, initial encounter 09/20/2019   ESRD on dialysis [facility])    Physical debility 09/09/2019   Pericardial effusion    SOB (shortness of breath)    Leukocytosis    Anemia of chronic disease    PAF (paroxysmal atrial fibrillation) (HCC)    Postoperative pain    Cardiac tamponade    Chest pain at rest 08/26/2019   Tachycardia 08/26/2019   Pericarditis 08/26/2019    Angina pectoris, unspecified ([place]) 08/25/2019   CAD (coronary artery disease) 11/05/2018   A-fib ([place]) 11/05/2018   Atrial fibrillation with RVR ([place])    Hypotension 11/04/2018   ESRD on hemodialysis ([place]) 11/04/2018   DVT (deep venous thrombosis) ([place]) 11/03/2018   Hyperkalemia 10/02/2018   Status post total replacement of right hip 09/15/2018   Encounter for removal of sutures 05/19/2018   Varicose veins of bilateral lower extremities with other complications 11/91/4782   Hypercalcemia 05/17/2017   Secondary hyperparathyroidism of renal origin ([place]) 02/21/2017   Pruritus, unspecified 02/13/2017   Pain, unspecified 02/13/2017   Other specified coagulation defects ([place]) 02/13/2017   Iron deficiency anemia, unspecified 02/13/2017   Fever, unspecified 02/13/2017   Anticoagulation goal of INR 2 to 3 09/30/2014   Pain in lower limb 05/02/2014   Onychomycosis due to dermatophyte 04/06/2013   Pain in joint, ankle and foot 04/06/2013   Bradycardia 11/06/2012   Left-sided epistaxis 11/05/2012   DM (diabetes mellitus) ([place]) 11/05/2012   OSA (obstructive sleep apnea) 11/05/2012   Acute posterior epistaxis 11/05/2012   CKD (chronic kidney disease) 11/05/2012   End stage renal disease ([place]) 06/25/2012   Type 2 diabetes mellitus with complication, without long-term current use of insulin ([place]) 12/20/2009   OBESITY 12/20/2009   OBESITY-MORBID (>100') 12/20/2009   Essential hypertension 12/20/2009   Paroxysmal atrial fibrillation ([place]) 12/20/2009   Chest pain 12/20/2009   ABNORMAL CV (STRESS) TEST 12/20/2009   Past Surgical History:  Procedure Laterality Date   ABDOMINAL HYSTERECTOMY     with panniculctomy   AV FISTULA PLACEMENT  09/02/2012   Procedure: ARTERIOVENOUS (AV) FISTULA CREATION;  Surgeon: Elam Dutch, MD;  Location: [facility];  Service: Vascular;  Laterality: Left;  Creation of Left Radial-Cephalic Fistula    BREAST SURGERY     Biopsy right breast   COLONOSCOPY      COLONOSCOPY W/ BIOPSIES AND POLYPECTOMY     DILATION AND CURETTAGE OF UTERUS     KNEE ARTHROSCOPY Right    PANNICULECTOMY     REVISON OF ARTERIOVENOUS FISTULA Left 04/27/2014   Procedure: REVISON OF LEFT RADIAL-CEPHALIC ARTERIOVENOUS FISTULA;  Surgeon: Mal Misty, MD;  Location: [facility] OR;  Service: Vascular;  Laterality: Left;   REVISON OF ARTERIOVENOUS FISTULA Left 01/13/2020   Procedure: REVISON OF ARTERIOVENOUS FISTULA;  Surgeon: Marty Heck, MD;  Location: [facility] OR;  Service: Vascular;  Laterality: Left;   TOTAL HIP ARTHROPLASTY Right 09/15/2018   TOTAL HIP ARTHROPLASTY Right 09/15/2018   Procedure: RIGHT TOTAL HIP ARTHROPLASTY ANTERIOR APPROACH;  Surgeon: Leandrew Koyanagi, MD;  Location: [facility];  Service: Orthopedics;  Laterality: Right;   TOTAL HIP ARTHROPLASTY Left 11/23/2020   Procedure: LEFT TOTAL HIP ARTHROPLASTY ANTERIOR APPROACH;  Surgeon: Leandrew Koyanagi, MD;  Location: [facility];  Service: Orthopedics;  Laterality: Left;  NEEDS RNFA PLEASE   UVULOPLASTY     VIDEO ASSISTED THORACOSCOPY (VATS)/WEDGE RESECTION Right 08/31/2019   Procedure: VIDEO ASSISTED THORACOSCOPY/ DRAINAGE OF PERICARDIAL EFFUSION/ PERICARDIAL WINDOW/ ABORTED PERICARDIOCENTESIS ;  Surgeon: Lajuana Matte, MD;  Location: [facility] OR;  Service: Thoracic;  Laterality: Right;   Current Outpatient Medications on File Prior to Visit  Medication Sig Dispense Refill   amiodarone (PACERONE) 200 MG tablet Take 1 tablet (200 mg total) by mouth daily. 90 tablet 3   ammonium lactate (AMLACTIN) 12 % lotion Apply 1 application topically 2 (two) times daily. (Patient not taking: Reported on 07/30/2021) 400 g 4   atorvastatin (LIPITOR) 40 MG tablet Take 1 tablet (40 mg total) by mouth daily. 90 tablet 3   B Complex-C-Folic Acid (DIALYVITE TABLET) TABS Take 1 tablet by mouth daily.     bisacodyl (DULCOLAX) 5 MG EC tablet Take 5 mg by mouth daily as needed for moderate constipation.     colchicine 0.6 MG tablet TAKE 0.5 TABLETS (0.3 MG  TOTAL) BY MOUTH EVERY MONDAY, WEDNESDAY, AND FRIDAY. 45 tablet 3   diphenhydrAMINE (BENADRYL) 25 MG tablet Take 25 mg by mouth daily as needed for itching. (Patient not taking: Reported on 07/30/2021)     docusate sodium (COLACE) 100 MG capsule Take 1 capsule (100 mg total) by mouth daily as needed. 30 capsule 2   Doxercalciferol (HECTOROL IV) Doxercalciferol (Hectorol)     ELIQUIS 5 MG TABS tablet Take 1 tablet (5 mg total) by mouth 2 (two) times daily. 180 tablet 3   Etelcalcetide HCl (PARSABIV IV) Etelcalcetide (Parsabiv)     ethyl chloride spray SMARTSIG:Sparingly Topical 3 Times Daily     fluticasone (FLONASE) 50 MCG/ACT nasal spray Place 2 sprays into both nostrils daily. 9.9 mL 0   gabapentin (NEURONTIN) 100 MG capsule Take 100 mg by mouth at bedtime.  3   loratadine (CLARITIN) 10 MG tablet Take 1 tablet (10 mg total) by mouth daily. 30 tablet 0   methocarbamol (ROBAXIN) 500 MG tablet Take 1 tablet (500 mg total) by mouth every 8 (eight) hours as needed for muscle spasms. (Patient not taking: Reported on 07/30/2021) 30 tablet 0   Methoxy PEG-Epoetin Beta (MIRCERA IJ) Mircera     midodrine (PROAMATINE) 10 MG tablet Take 1 tablet (10 mg total) by mouth 3 (three) times daily with meals.     sucroferric oxyhydroxide (VELPHORO) 500 MG chewable tablet Chew 2 tablets (1,000 mg total) by mouth 3 (three) times daily with meals. 180 tablet 0   No current facility-administered medications on file prior to visit.    Allergies  Allergen Reactions   Iodine Shortness Of Breath   Other Shortness Of Breath and Anaphylaxis   Shellfish Allergy Shortness Of Breath    Other reaction(s): Breathing Problems   Shellfish-Derived Products Shortness Of Breath   Amlodipine Swelling and Other (See Comments)    Edema   Felodipine Other (See Comments)    Headache   Lisinopril Cough   Social History   Occupational History   Occupation: CORRESPONDENT Estate agent.    Employer: [employer]  Tobacco Use    Smoking status: Never   Smokeless tobacco: Never  Vaping Use   Vaping Use: Never used  Substance and Sexual Activity   Alcohol use: No    Alcohol/week: 0.0 standard drinks   Drug use: No   Sexual activity: Not on file    Comment: Hysterectomy   Family History  Problem Relation Age of Onset   Lung cancer Father        died @ 85   Hypertension Mother    Diabetes Brother    Kidney disease Brother    Immunization History  Administered Date(s) Administered   Hepatitis B, adult 03/13/2017, 04/10/2017, 05/15/2017, 09/10/2017, 12/10/2017, 01/14/2018, 02/11/2018, 06/10/2018, 09/11/2018   Influenza,inj,Quad PF,6+ Mos 08/20/2019   Influenza-Unspecified 09/09/2018, 08/20/2019   Moderna Sars-Covid-2 Vaccination 02/18/2020, 03/15/2020   Pneumococcal Polysaccharide-23 04/15/2017     Review of Systems: Negative except as noted in the HPI.   Objective: There were no vitals filed for this visit.  Brenda Arnold is a pleasant 65 y.o. female in NAD. AAO X 3.  Vascular Examination: CFT <3 seconds b/l LE. Faintly palpable DP pulses b/l. Nonpalpable PT pulses b/l. Pedal hair absent b/l. Skin temperature gradient WNL b/l. No pain with calf compression b/l. No edema b/l LE. No cyanosis or clubbing noted b/l LE.  Dermatological Examination: Pedal integument with normal turgor, texture and tone b/l LE. No open wounds b/l. No interdigital macerations b/l. Toenails 1-5 b/l elongated, thickened, discolored with subungual debris. +Tenderness with dorsal palpation of nailplates. Hyperkeratotic lesion(s) noted submet head 5 b/l.  Musculoskeletal Examination: Muscle strength 5/5 to all lower extremity muscle groups bilaterally. No pain, crepitus or joint limitation noted with ROM bilateral LE. Hammertoe deformity noted 1-5 b/l. Pes planus deformity noted b/l lower extremities.  Footwear Assessment: Does the patient wear appropriate shoes? Yes. Does the patient need inserts/orthotics?  Yes.  Neurological Examination: Pt has subjective symptoms of neuropathy. Protective sensation intact 5/5 intact bilaterally with 10g monofilament b/l.  Hemoglobin A1C Latest Ref Rng & Units 03/18/2021 11/21/2020  HGBA1C 4.8 - 5.6 % 5.2 4.7(L)  Some recent data might be hidden   Assessment: 1. Pain due to onychomycosis of toenails of both feet   2. Callus   3. Diabetes mellitus due to underlying condition with chronic kidney disease on chronic dialysis, without long-term current use of insulin ([place])   4. Acquired hammertoes of both feet   5. Encounter for diabetic foot exam ([place])     ADA Risk Categorization: High Risk  Patient has one or more of the following: Loss of protective sensation Absent pedal pulses Severe Foot deformity: Hammertoes and pes planus bilaterally. History of foot ulcer  Plan: -Examined patient. -We discussed diabetic shoes/inserts benefits. She currently has no threat of ulceration. She would like to think about it. We will revisit on next visit.. -Diabetic foot examination performed today. -Continue foot and shoe inspections daily. Monitor blood glucose per PCP/Endocrinologist's recommendations. -Patient to continue soft, supportive shoe gear daily. -Mycotic toenails 1-5 bilaterally were debrided in length and girth with sterile nail nippers and dremel without incident. -Callus(es) submet head 5 b/l pared utilizing sterile scalpel blade without complication or incident. Total number debrided =2. -Patient to report any pedal injuries to medical professional immediately. -Patient/POA to call should there be question/concern in the interim.  Return in about 3 months (around 01/31/2022).  Marzetta Board, DPM

## 2021-11-05 DIAGNOSIS — N186 End stage renal disease: Secondary | ICD-10-CM | POA: Diagnosis not present

## 2021-11-05 DIAGNOSIS — N2581 Secondary hyperparathyroidism of renal origin: Secondary | ICD-10-CM | POA: Diagnosis not present

## 2021-11-05 DIAGNOSIS — Z992 Dependence on renal dialysis: Secondary | ICD-10-CM | POA: Diagnosis not present

## 2021-11-05 DIAGNOSIS — D631 Anemia in chronic kidney disease: Secondary | ICD-10-CM | POA: Diagnosis not present

## 2021-11-07 DIAGNOSIS — N186 End stage renal disease: Secondary | ICD-10-CM | POA: Diagnosis not present

## 2021-11-07 DIAGNOSIS — N2581 Secondary hyperparathyroidism of renal origin: Secondary | ICD-10-CM | POA: Diagnosis not present

## 2021-11-07 DIAGNOSIS — Z992 Dependence on renal dialysis: Secondary | ICD-10-CM | POA: Diagnosis not present

## 2021-11-07 DIAGNOSIS — D631 Anemia in chronic kidney disease: Secondary | ICD-10-CM | POA: Diagnosis not present

## 2021-11-09 DIAGNOSIS — D631 Anemia in chronic kidney disease: Secondary | ICD-10-CM | POA: Diagnosis not present

## 2021-11-09 DIAGNOSIS — N2581 Secondary hyperparathyroidism of renal origin: Secondary | ICD-10-CM | POA: Diagnosis not present

## 2021-11-09 DIAGNOSIS — N186 End stage renal disease: Secondary | ICD-10-CM | POA: Diagnosis not present

## 2021-11-09 DIAGNOSIS — Z992 Dependence on renal dialysis: Secondary | ICD-10-CM | POA: Diagnosis not present

## 2021-11-12 DIAGNOSIS — Z992 Dependence on renal dialysis: Secondary | ICD-10-CM | POA: Diagnosis not present

## 2021-11-12 DIAGNOSIS — D631 Anemia in chronic kidney disease: Secondary | ICD-10-CM | POA: Diagnosis not present

## 2021-11-12 DIAGNOSIS — N186 End stage renal disease: Secondary | ICD-10-CM | POA: Diagnosis not present

## 2021-11-12 DIAGNOSIS — N2581 Secondary hyperparathyroidism of renal origin: Secondary | ICD-10-CM | POA: Diagnosis not present

## 2021-11-14 DIAGNOSIS — N186 End stage renal disease: Secondary | ICD-10-CM | POA: Diagnosis not present

## 2021-11-14 DIAGNOSIS — D631 Anemia in chronic kidney disease: Secondary | ICD-10-CM | POA: Diagnosis not present

## 2021-11-14 DIAGNOSIS — Z992 Dependence on renal dialysis: Secondary | ICD-10-CM | POA: Diagnosis not present

## 2021-11-14 DIAGNOSIS — N2581 Secondary hyperparathyroidism of renal origin: Secondary | ICD-10-CM | POA: Diagnosis not present

## 2021-11-16 DIAGNOSIS — Z992 Dependence on renal dialysis: Secondary | ICD-10-CM | POA: Diagnosis not present

## 2021-11-16 DIAGNOSIS — N186 End stage renal disease: Secondary | ICD-10-CM | POA: Diagnosis not present

## 2021-11-16 DIAGNOSIS — N2581 Secondary hyperparathyroidism of renal origin: Secondary | ICD-10-CM | POA: Diagnosis not present

## 2021-11-16 DIAGNOSIS — D631 Anemia in chronic kidney disease: Secondary | ICD-10-CM | POA: Diagnosis not present

## 2021-11-19 DIAGNOSIS — Z992 Dependence on renal dialysis: Secondary | ICD-10-CM | POA: Diagnosis not present

## 2021-11-19 DIAGNOSIS — N2581 Secondary hyperparathyroidism of renal origin: Secondary | ICD-10-CM | POA: Diagnosis not present

## 2021-11-19 DIAGNOSIS — N186 End stage renal disease: Secondary | ICD-10-CM | POA: Diagnosis not present

## 2021-11-19 DIAGNOSIS — D631 Anemia in chronic kidney disease: Secondary | ICD-10-CM | POA: Diagnosis not present

## 2021-11-21 DIAGNOSIS — D631 Anemia in chronic kidney disease: Secondary | ICD-10-CM | POA: Diagnosis not present

## 2021-11-21 DIAGNOSIS — N2581 Secondary hyperparathyroidism of renal origin: Secondary | ICD-10-CM | POA: Diagnosis not present

## 2021-11-21 DIAGNOSIS — Z992 Dependence on renal dialysis: Secondary | ICD-10-CM | POA: Diagnosis not present

## 2021-11-21 DIAGNOSIS — N186 End stage renal disease: Secondary | ICD-10-CM | POA: Diagnosis not present

## 2021-11-23 DIAGNOSIS — Z992 Dependence on renal dialysis: Secondary | ICD-10-CM | POA: Diagnosis not present

## 2021-11-23 DIAGNOSIS — N186 End stage renal disease: Secondary | ICD-10-CM | POA: Diagnosis not present

## 2021-11-23 DIAGNOSIS — D631 Anemia in chronic kidney disease: Secondary | ICD-10-CM | POA: Diagnosis not present

## 2021-11-23 DIAGNOSIS — N2581 Secondary hyperparathyroidism of renal origin: Secondary | ICD-10-CM | POA: Diagnosis not present

## 2021-11-26 DIAGNOSIS — Z992 Dependence on renal dialysis: Secondary | ICD-10-CM | POA: Diagnosis not present

## 2021-11-26 DIAGNOSIS — N186 End stage renal disease: Secondary | ICD-10-CM | POA: Diagnosis not present

## 2021-11-26 DIAGNOSIS — D631 Anemia in chronic kidney disease: Secondary | ICD-10-CM | POA: Diagnosis not present

## 2021-11-26 DIAGNOSIS — N2581 Secondary hyperparathyroidism of renal origin: Secondary | ICD-10-CM | POA: Diagnosis not present

## 2021-11-28 DIAGNOSIS — Z992 Dependence on renal dialysis: Secondary | ICD-10-CM | POA: Diagnosis not present

## 2021-11-28 DIAGNOSIS — N2581 Secondary hyperparathyroidism of renal origin: Secondary | ICD-10-CM | POA: Diagnosis not present

## 2021-11-28 DIAGNOSIS — N186 End stage renal disease: Secondary | ICD-10-CM | POA: Diagnosis not present

## 2021-11-28 DIAGNOSIS — D631 Anemia in chronic kidney disease: Secondary | ICD-10-CM | POA: Diagnosis not present

## 2021-11-30 DIAGNOSIS — N186 End stage renal disease: Secondary | ICD-10-CM | POA: Diagnosis not present

## 2021-11-30 DIAGNOSIS — D631 Anemia in chronic kidney disease: Secondary | ICD-10-CM | POA: Diagnosis not present

## 2021-11-30 DIAGNOSIS — Z992 Dependence on renal dialysis: Secondary | ICD-10-CM | POA: Diagnosis not present

## 2021-11-30 DIAGNOSIS — N2581 Secondary hyperparathyroidism of renal origin: Secondary | ICD-10-CM | POA: Diagnosis not present

## 2021-12-02 DIAGNOSIS — Z992 Dependence on renal dialysis: Secondary | ICD-10-CM | POA: Diagnosis not present

## 2021-12-02 DIAGNOSIS — E1122 Type 2 diabetes mellitus with diabetic chronic kidney disease: Secondary | ICD-10-CM | POA: Diagnosis not present

## 2021-12-02 DIAGNOSIS — N186 End stage renal disease: Secondary | ICD-10-CM | POA: Diagnosis not present

## 2021-12-03 DIAGNOSIS — N2581 Secondary hyperparathyroidism of renal origin: Secondary | ICD-10-CM | POA: Diagnosis not present

## 2021-12-03 DIAGNOSIS — Z992 Dependence on renal dialysis: Secondary | ICD-10-CM | POA: Diagnosis not present

## 2021-12-03 DIAGNOSIS — D509 Iron deficiency anemia, unspecified: Secondary | ICD-10-CM | POA: Diagnosis not present

## 2021-12-03 DIAGNOSIS — N186 End stage renal disease: Secondary | ICD-10-CM | POA: Diagnosis not present

## 2021-12-03 DIAGNOSIS — D631 Anemia in chronic kidney disease: Secondary | ICD-10-CM | POA: Diagnosis not present

## 2021-12-05 DIAGNOSIS — N186 End stage renal disease: Secondary | ICD-10-CM | POA: Diagnosis not present

## 2021-12-05 DIAGNOSIS — D509 Iron deficiency anemia, unspecified: Secondary | ICD-10-CM | POA: Diagnosis not present

## 2021-12-05 DIAGNOSIS — Z992 Dependence on renal dialysis: Secondary | ICD-10-CM | POA: Diagnosis not present

## 2021-12-05 DIAGNOSIS — N2581 Secondary hyperparathyroidism of renal origin: Secondary | ICD-10-CM | POA: Diagnosis not present

## 2021-12-05 DIAGNOSIS — D631 Anemia in chronic kidney disease: Secondary | ICD-10-CM | POA: Diagnosis not present

## 2021-12-07 DIAGNOSIS — D631 Anemia in chronic kidney disease: Secondary | ICD-10-CM | POA: Diagnosis not present

## 2021-12-07 DIAGNOSIS — D509 Iron deficiency anemia, unspecified: Secondary | ICD-10-CM | POA: Diagnosis not present

## 2021-12-07 DIAGNOSIS — Z992 Dependence on renal dialysis: Secondary | ICD-10-CM | POA: Diagnosis not present

## 2021-12-07 DIAGNOSIS — N2581 Secondary hyperparathyroidism of renal origin: Secondary | ICD-10-CM | POA: Diagnosis not present

## 2021-12-07 DIAGNOSIS — N186 End stage renal disease: Secondary | ICD-10-CM | POA: Diagnosis not present

## 2021-12-10 DIAGNOSIS — D631 Anemia in chronic kidney disease: Secondary | ICD-10-CM | POA: Diagnosis not present

## 2021-12-10 DIAGNOSIS — N186 End stage renal disease: Secondary | ICD-10-CM | POA: Diagnosis not present

## 2021-12-10 DIAGNOSIS — Z992 Dependence on renal dialysis: Secondary | ICD-10-CM | POA: Diagnosis not present

## 2021-12-10 DIAGNOSIS — D509 Iron deficiency anemia, unspecified: Secondary | ICD-10-CM | POA: Diagnosis not present

## 2021-12-10 DIAGNOSIS — N2581 Secondary hyperparathyroidism of renal origin: Secondary | ICD-10-CM | POA: Diagnosis not present

## 2021-12-12 DIAGNOSIS — N2581 Secondary hyperparathyroidism of renal origin: Secondary | ICD-10-CM | POA: Diagnosis not present

## 2021-12-12 DIAGNOSIS — N186 End stage renal disease: Secondary | ICD-10-CM | POA: Diagnosis not present

## 2021-12-12 DIAGNOSIS — Z992 Dependence on renal dialysis: Secondary | ICD-10-CM | POA: Diagnosis not present

## 2021-12-12 DIAGNOSIS — D631 Anemia in chronic kidney disease: Secondary | ICD-10-CM | POA: Diagnosis not present

## 2021-12-12 DIAGNOSIS — D509 Iron deficiency anemia, unspecified: Secondary | ICD-10-CM | POA: Diagnosis not present

## 2021-12-14 DIAGNOSIS — D631 Anemia in chronic kidney disease: Secondary | ICD-10-CM | POA: Diagnosis not present

## 2021-12-14 DIAGNOSIS — Z23 Encounter for immunization: Secondary | ICD-10-CM | POA: Diagnosis not present

## 2021-12-14 DIAGNOSIS — N186 End stage renal disease: Secondary | ICD-10-CM | POA: Diagnosis not present

## 2021-12-14 DIAGNOSIS — D509 Iron deficiency anemia, unspecified: Secondary | ICD-10-CM | POA: Diagnosis not present

## 2021-12-14 DIAGNOSIS — Z992 Dependence on renal dialysis: Secondary | ICD-10-CM | POA: Diagnosis not present

## 2021-12-14 DIAGNOSIS — N2581 Secondary hyperparathyroidism of renal origin: Secondary | ICD-10-CM | POA: Diagnosis not present

## 2021-12-17 DIAGNOSIS — D509 Iron deficiency anemia, unspecified: Secondary | ICD-10-CM | POA: Diagnosis not present

## 2021-12-17 DIAGNOSIS — D631 Anemia in chronic kidney disease: Secondary | ICD-10-CM | POA: Diagnosis not present

## 2021-12-17 DIAGNOSIS — N2581 Secondary hyperparathyroidism of renal origin: Secondary | ICD-10-CM | POA: Diagnosis not present

## 2021-12-17 DIAGNOSIS — Z992 Dependence on renal dialysis: Secondary | ICD-10-CM | POA: Diagnosis not present

## 2021-12-17 DIAGNOSIS — N186 End stage renal disease: Secondary | ICD-10-CM | POA: Diagnosis not present

## 2021-12-19 DIAGNOSIS — N186 End stage renal disease: Secondary | ICD-10-CM | POA: Diagnosis not present

## 2021-12-19 DIAGNOSIS — N2581 Secondary hyperparathyroidism of renal origin: Secondary | ICD-10-CM | POA: Diagnosis not present

## 2021-12-19 DIAGNOSIS — Z992 Dependence on renal dialysis: Secondary | ICD-10-CM | POA: Diagnosis not present

## 2021-12-19 DIAGNOSIS — D509 Iron deficiency anemia, unspecified: Secondary | ICD-10-CM | POA: Diagnosis not present

## 2021-12-19 DIAGNOSIS — D631 Anemia in chronic kidney disease: Secondary | ICD-10-CM | POA: Diagnosis not present

## 2021-12-21 DIAGNOSIS — D509 Iron deficiency anemia, unspecified: Secondary | ICD-10-CM | POA: Diagnosis not present

## 2021-12-21 DIAGNOSIS — Z992 Dependence on renal dialysis: Secondary | ICD-10-CM | POA: Diagnosis not present

## 2021-12-21 DIAGNOSIS — N2581 Secondary hyperparathyroidism of renal origin: Secondary | ICD-10-CM | POA: Diagnosis not present

## 2021-12-21 DIAGNOSIS — N186 End stage renal disease: Secondary | ICD-10-CM | POA: Diagnosis not present

## 2021-12-21 DIAGNOSIS — D631 Anemia in chronic kidney disease: Secondary | ICD-10-CM | POA: Diagnosis not present

## 2021-12-24 DIAGNOSIS — N2581 Secondary hyperparathyroidism of renal origin: Secondary | ICD-10-CM | POA: Diagnosis not present

## 2021-12-24 DIAGNOSIS — D631 Anemia in chronic kidney disease: Secondary | ICD-10-CM | POA: Diagnosis not present

## 2021-12-24 DIAGNOSIS — N186 End stage renal disease: Secondary | ICD-10-CM | POA: Diagnosis not present

## 2021-12-24 DIAGNOSIS — D509 Iron deficiency anemia, unspecified: Secondary | ICD-10-CM | POA: Diagnosis not present

## 2021-12-24 DIAGNOSIS — Z992 Dependence on renal dialysis: Secondary | ICD-10-CM | POA: Diagnosis not present

## 2021-12-26 DIAGNOSIS — Z992 Dependence on renal dialysis: Secondary | ICD-10-CM | POA: Diagnosis not present

## 2021-12-26 DIAGNOSIS — N2581 Secondary hyperparathyroidism of renal origin: Secondary | ICD-10-CM | POA: Diagnosis not present

## 2021-12-26 DIAGNOSIS — N186 End stage renal disease: Secondary | ICD-10-CM | POA: Diagnosis not present

## 2021-12-26 DIAGNOSIS — D509 Iron deficiency anemia, unspecified: Secondary | ICD-10-CM | POA: Diagnosis not present

## 2021-12-26 DIAGNOSIS — D631 Anemia in chronic kidney disease: Secondary | ICD-10-CM | POA: Diagnosis not present

## 2021-12-28 DIAGNOSIS — D509 Iron deficiency anemia, unspecified: Secondary | ICD-10-CM | POA: Diagnosis not present

## 2021-12-28 DIAGNOSIS — Z992 Dependence on renal dialysis: Secondary | ICD-10-CM | POA: Diagnosis not present

## 2021-12-28 DIAGNOSIS — N186 End stage renal disease: Secondary | ICD-10-CM | POA: Diagnosis not present

## 2021-12-28 DIAGNOSIS — N2581 Secondary hyperparathyroidism of renal origin: Secondary | ICD-10-CM | POA: Diagnosis not present

## 2021-12-28 DIAGNOSIS — D631 Anemia in chronic kidney disease: Secondary | ICD-10-CM | POA: Diagnosis not present

## 2021-12-31 DIAGNOSIS — Z992 Dependence on renal dialysis: Secondary | ICD-10-CM | POA: Diagnosis not present

## 2021-12-31 DIAGNOSIS — D509 Iron deficiency anemia, unspecified: Secondary | ICD-10-CM | POA: Diagnosis not present

## 2021-12-31 DIAGNOSIS — N2581 Secondary hyperparathyroidism of renal origin: Secondary | ICD-10-CM | POA: Diagnosis not present

## 2021-12-31 DIAGNOSIS — N186 End stage renal disease: Secondary | ICD-10-CM | POA: Diagnosis not present

## 2021-12-31 DIAGNOSIS — D631 Anemia in chronic kidney disease: Secondary | ICD-10-CM | POA: Diagnosis not present

## 2022-01-02 DIAGNOSIS — Z992 Dependence on renal dialysis: Secondary | ICD-10-CM | POA: Diagnosis not present

## 2022-01-02 DIAGNOSIS — N186 End stage renal disease: Secondary | ICD-10-CM | POA: Diagnosis not present

## 2022-01-02 DIAGNOSIS — E1122 Type 2 diabetes mellitus with diabetic chronic kidney disease: Secondary | ICD-10-CM | POA: Diagnosis not present

## 2022-01-02 DIAGNOSIS — D631 Anemia in chronic kidney disease: Secondary | ICD-10-CM | POA: Diagnosis not present

## 2022-01-02 DIAGNOSIS — N2581 Secondary hyperparathyroidism of renal origin: Secondary | ICD-10-CM | POA: Diagnosis not present

## 2022-01-02 DIAGNOSIS — D509 Iron deficiency anemia, unspecified: Secondary | ICD-10-CM | POA: Diagnosis not present

## 2022-01-04 DIAGNOSIS — N186 End stage renal disease: Secondary | ICD-10-CM | POA: Diagnosis not present

## 2022-01-04 DIAGNOSIS — D631 Anemia in chronic kidney disease: Secondary | ICD-10-CM | POA: Diagnosis not present

## 2022-01-04 DIAGNOSIS — D509 Iron deficiency anemia, unspecified: Secondary | ICD-10-CM | POA: Diagnosis not present

## 2022-01-04 DIAGNOSIS — N2581 Secondary hyperparathyroidism of renal origin: Secondary | ICD-10-CM | POA: Diagnosis not present

## 2022-01-04 DIAGNOSIS — Z992 Dependence on renal dialysis: Secondary | ICD-10-CM | POA: Diagnosis not present

## 2022-01-07 DIAGNOSIS — N2581 Secondary hyperparathyroidism of renal origin: Secondary | ICD-10-CM | POA: Diagnosis not present

## 2022-01-07 DIAGNOSIS — D631 Anemia in chronic kidney disease: Secondary | ICD-10-CM | POA: Diagnosis not present

## 2022-01-07 DIAGNOSIS — N186 End stage renal disease: Secondary | ICD-10-CM | POA: Diagnosis not present

## 2022-01-07 DIAGNOSIS — D509 Iron deficiency anemia, unspecified: Secondary | ICD-10-CM | POA: Diagnosis not present

## 2022-01-07 DIAGNOSIS — Z992 Dependence on renal dialysis: Secondary | ICD-10-CM | POA: Diagnosis not present

## 2022-01-09 DIAGNOSIS — N2581 Secondary hyperparathyroidism of renal origin: Secondary | ICD-10-CM | POA: Diagnosis not present

## 2022-01-09 DIAGNOSIS — Z992 Dependence on renal dialysis: Secondary | ICD-10-CM | POA: Diagnosis not present

## 2022-01-09 DIAGNOSIS — D509 Iron deficiency anemia, unspecified: Secondary | ICD-10-CM | POA: Diagnosis not present

## 2022-01-09 DIAGNOSIS — N186 End stage renal disease: Secondary | ICD-10-CM | POA: Diagnosis not present

## 2022-01-09 DIAGNOSIS — D631 Anemia in chronic kidney disease: Secondary | ICD-10-CM | POA: Diagnosis not present

## 2022-01-11 DIAGNOSIS — N186 End stage renal disease: Secondary | ICD-10-CM | POA: Diagnosis not present

## 2022-01-11 DIAGNOSIS — Z992 Dependence on renal dialysis: Secondary | ICD-10-CM | POA: Diagnosis not present

## 2022-01-11 DIAGNOSIS — N2581 Secondary hyperparathyroidism of renal origin: Secondary | ICD-10-CM | POA: Diagnosis not present

## 2022-01-11 DIAGNOSIS — D631 Anemia in chronic kidney disease: Secondary | ICD-10-CM | POA: Diagnosis not present

## 2022-01-11 DIAGNOSIS — D509 Iron deficiency anemia, unspecified: Secondary | ICD-10-CM | POA: Diagnosis not present

## 2022-01-14 DIAGNOSIS — Z992 Dependence on renal dialysis: Secondary | ICD-10-CM | POA: Diagnosis not present

## 2022-01-14 DIAGNOSIS — D631 Anemia in chronic kidney disease: Secondary | ICD-10-CM | POA: Diagnosis not present

## 2022-01-14 DIAGNOSIS — N186 End stage renal disease: Secondary | ICD-10-CM | POA: Diagnosis not present

## 2022-01-14 DIAGNOSIS — N2581 Secondary hyperparathyroidism of renal origin: Secondary | ICD-10-CM | POA: Diagnosis not present

## 2022-01-14 DIAGNOSIS — D509 Iron deficiency anemia, unspecified: Secondary | ICD-10-CM | POA: Diagnosis not present

## 2022-01-16 DIAGNOSIS — Z992 Dependence on renal dialysis: Secondary | ICD-10-CM | POA: Diagnosis not present

## 2022-01-16 DIAGNOSIS — D631 Anemia in chronic kidney disease: Secondary | ICD-10-CM | POA: Diagnosis not present

## 2022-01-16 DIAGNOSIS — N186 End stage renal disease: Secondary | ICD-10-CM | POA: Diagnosis not present

## 2022-01-16 DIAGNOSIS — D509 Iron deficiency anemia, unspecified: Secondary | ICD-10-CM | POA: Diagnosis not present

## 2022-01-16 DIAGNOSIS — N2581 Secondary hyperparathyroidism of renal origin: Secondary | ICD-10-CM | POA: Diagnosis not present

## 2022-01-18 DIAGNOSIS — Z992 Dependence on renal dialysis: Secondary | ICD-10-CM | POA: Diagnosis not present

## 2022-01-18 DIAGNOSIS — C9000 Multiple myeloma not having achieved remission: Secondary | ICD-10-CM | POA: Diagnosis not present

## 2022-01-18 DIAGNOSIS — N186 End stage renal disease: Secondary | ICD-10-CM | POA: Diagnosis not present

## 2022-01-18 DIAGNOSIS — D631 Anemia in chronic kidney disease: Secondary | ICD-10-CM | POA: Diagnosis not present

## 2022-01-18 DIAGNOSIS — N2581 Secondary hyperparathyroidism of renal origin: Secondary | ICD-10-CM | POA: Diagnosis not present

## 2022-01-18 DIAGNOSIS — D509 Iron deficiency anemia, unspecified: Secondary | ICD-10-CM | POA: Diagnosis not present

## 2022-01-21 DIAGNOSIS — D509 Iron deficiency anemia, unspecified: Secondary | ICD-10-CM | POA: Diagnosis not present

## 2022-01-21 DIAGNOSIS — N186 End stage renal disease: Secondary | ICD-10-CM | POA: Diagnosis not present

## 2022-01-21 DIAGNOSIS — D631 Anemia in chronic kidney disease: Secondary | ICD-10-CM | POA: Diagnosis not present

## 2022-01-21 DIAGNOSIS — N2581 Secondary hyperparathyroidism of renal origin: Secondary | ICD-10-CM | POA: Diagnosis not present

## 2022-01-21 DIAGNOSIS — Z992 Dependence on renal dialysis: Secondary | ICD-10-CM | POA: Diagnosis not present

## 2022-01-23 DIAGNOSIS — D631 Anemia in chronic kidney disease: Secondary | ICD-10-CM | POA: Diagnosis not present

## 2022-01-23 DIAGNOSIS — Z992 Dependence on renal dialysis: Secondary | ICD-10-CM | POA: Diagnosis not present

## 2022-01-23 DIAGNOSIS — N2581 Secondary hyperparathyroidism of renal origin: Secondary | ICD-10-CM | POA: Diagnosis not present

## 2022-01-23 DIAGNOSIS — D509 Iron deficiency anemia, unspecified: Secondary | ICD-10-CM | POA: Diagnosis not present

## 2022-01-23 DIAGNOSIS — N186 End stage renal disease: Secondary | ICD-10-CM | POA: Diagnosis not present

## 2022-01-25 DIAGNOSIS — D631 Anemia in chronic kidney disease: Secondary | ICD-10-CM | POA: Diagnosis not present

## 2022-01-25 DIAGNOSIS — Z992 Dependence on renal dialysis: Secondary | ICD-10-CM | POA: Diagnosis not present

## 2022-01-25 DIAGNOSIS — N2581 Secondary hyperparathyroidism of renal origin: Secondary | ICD-10-CM | POA: Diagnosis not present

## 2022-01-25 DIAGNOSIS — N186 End stage renal disease: Secondary | ICD-10-CM | POA: Diagnosis not present

## 2022-01-25 DIAGNOSIS — D509 Iron deficiency anemia, unspecified: Secondary | ICD-10-CM | POA: Diagnosis not present

## 2022-01-28 DIAGNOSIS — D509 Iron deficiency anemia, unspecified: Secondary | ICD-10-CM | POA: Diagnosis not present

## 2022-01-28 DIAGNOSIS — D631 Anemia in chronic kidney disease: Secondary | ICD-10-CM | POA: Diagnosis not present

## 2022-01-28 DIAGNOSIS — N2581 Secondary hyperparathyroidism of renal origin: Secondary | ICD-10-CM | POA: Diagnosis not present

## 2022-01-28 DIAGNOSIS — N186 End stage renal disease: Secondary | ICD-10-CM | POA: Diagnosis not present

## 2022-01-28 DIAGNOSIS — Z992 Dependence on renal dialysis: Secondary | ICD-10-CM | POA: Diagnosis not present

## 2022-01-30 DIAGNOSIS — D631 Anemia in chronic kidney disease: Secondary | ICD-10-CM | POA: Diagnosis not present

## 2022-01-30 DIAGNOSIS — N186 End stage renal disease: Secondary | ICD-10-CM | POA: Diagnosis not present

## 2022-01-30 DIAGNOSIS — N2581 Secondary hyperparathyroidism of renal origin: Secondary | ICD-10-CM | POA: Diagnosis not present

## 2022-01-30 DIAGNOSIS — E1122 Type 2 diabetes mellitus with diabetic chronic kidney disease: Secondary | ICD-10-CM | POA: Diagnosis not present

## 2022-01-30 DIAGNOSIS — Z992 Dependence on renal dialysis: Secondary | ICD-10-CM | POA: Diagnosis not present

## 2022-02-01 DIAGNOSIS — D631 Anemia in chronic kidney disease: Secondary | ICD-10-CM | POA: Diagnosis not present

## 2022-02-01 DIAGNOSIS — Z992 Dependence on renal dialysis: Secondary | ICD-10-CM | POA: Diagnosis not present

## 2022-02-01 DIAGNOSIS — N2581 Secondary hyperparathyroidism of renal origin: Secondary | ICD-10-CM | POA: Diagnosis not present

## 2022-02-01 DIAGNOSIS — N186 End stage renal disease: Secondary | ICD-10-CM | POA: Diagnosis not present

## 2022-02-04 DIAGNOSIS — Z992 Dependence on renal dialysis: Secondary | ICD-10-CM | POA: Diagnosis not present

## 2022-02-04 DIAGNOSIS — N186 End stage renal disease: Secondary | ICD-10-CM | POA: Diagnosis not present

## 2022-02-04 DIAGNOSIS — D631 Anemia in chronic kidney disease: Secondary | ICD-10-CM | POA: Diagnosis not present

## 2022-02-04 DIAGNOSIS — N2581 Secondary hyperparathyroidism of renal origin: Secondary | ICD-10-CM | POA: Diagnosis not present

## 2022-02-06 ENCOUNTER — Other Ambulatory Visit: Payer: Self-pay | Admitting: Nephrology

## 2022-02-06 ENCOUNTER — Other Ambulatory Visit (HOSPITAL_COMMUNITY): Payer: Self-pay | Admitting: Nephrology

## 2022-02-06 DIAGNOSIS — Z992 Dependence on renal dialysis: Secondary | ICD-10-CM | POA: Diagnosis not present

## 2022-02-06 DIAGNOSIS — N186 End stage renal disease: Secondary | ICD-10-CM | POA: Diagnosis not present

## 2022-02-06 DIAGNOSIS — D631 Anemia in chronic kidney disease: Secondary | ICD-10-CM | POA: Diagnosis not present

## 2022-02-06 DIAGNOSIS — Z87448 Personal history of other diseases of urinary system: Secondary | ICD-10-CM

## 2022-02-06 DIAGNOSIS — N2581 Secondary hyperparathyroidism of renal origin: Secondary | ICD-10-CM | POA: Diagnosis not present

## 2022-02-08 DIAGNOSIS — Z992 Dependence on renal dialysis: Secondary | ICD-10-CM | POA: Diagnosis not present

## 2022-02-08 DIAGNOSIS — N186 End stage renal disease: Secondary | ICD-10-CM | POA: Diagnosis not present

## 2022-02-08 DIAGNOSIS — N2581 Secondary hyperparathyroidism of renal origin: Secondary | ICD-10-CM | POA: Diagnosis not present

## 2022-02-08 DIAGNOSIS — D631 Anemia in chronic kidney disease: Secondary | ICD-10-CM | POA: Diagnosis not present

## 2022-02-11 DIAGNOSIS — D631 Anemia in chronic kidney disease: Secondary | ICD-10-CM | POA: Diagnosis not present

## 2022-02-11 DIAGNOSIS — N2581 Secondary hyperparathyroidism of renal origin: Secondary | ICD-10-CM | POA: Diagnosis not present

## 2022-02-11 DIAGNOSIS — N186 End stage renal disease: Secondary | ICD-10-CM | POA: Diagnosis not present

## 2022-02-11 DIAGNOSIS — Z992 Dependence on renal dialysis: Secondary | ICD-10-CM | POA: Diagnosis not present

## 2022-02-12 ENCOUNTER — Ambulatory Visit (HOSPITAL_COMMUNITY): Admission: RE | Admit: 2022-02-12 | Payer: Medicare Other | Source: Ambulatory Visit

## 2022-02-13 DIAGNOSIS — D631 Anemia in chronic kidney disease: Secondary | ICD-10-CM | POA: Diagnosis not present

## 2022-02-13 DIAGNOSIS — Z992 Dependence on renal dialysis: Secondary | ICD-10-CM | POA: Diagnosis not present

## 2022-02-13 DIAGNOSIS — N2581 Secondary hyperparathyroidism of renal origin: Secondary | ICD-10-CM | POA: Diagnosis not present

## 2022-02-13 DIAGNOSIS — N186 End stage renal disease: Secondary | ICD-10-CM | POA: Diagnosis not present

## 2022-02-15 ENCOUNTER — Encounter: Payer: Self-pay | Admitting: Podiatry

## 2022-02-15 ENCOUNTER — Other Ambulatory Visit: Payer: Self-pay

## 2022-02-15 ENCOUNTER — Ambulatory Visit (INDEPENDENT_AMBULATORY_CARE_PROVIDER_SITE_OTHER): Payer: Medicare Other | Admitting: Podiatry

## 2022-02-15 DIAGNOSIS — N2581 Secondary hyperparathyroidism of renal origin: Secondary | ICD-10-CM | POA: Diagnosis not present

## 2022-02-15 DIAGNOSIS — B351 Tinea unguium: Secondary | ICD-10-CM | POA: Diagnosis not present

## 2022-02-15 DIAGNOSIS — M79675 Pain in left toe(s): Secondary | ICD-10-CM

## 2022-02-15 DIAGNOSIS — Z992 Dependence on renal dialysis: Secondary | ICD-10-CM | POA: Diagnosis not present

## 2022-02-15 DIAGNOSIS — N186 End stage renal disease: Secondary | ICD-10-CM

## 2022-02-15 DIAGNOSIS — D631 Anemia in chronic kidney disease: Secondary | ICD-10-CM | POA: Diagnosis not present

## 2022-02-15 DIAGNOSIS — E0822 Diabetes mellitus due to underlying condition with diabetic chronic kidney disease: Secondary | ICD-10-CM

## 2022-02-15 DIAGNOSIS — L84 Corns and callosities: Secondary | ICD-10-CM | POA: Diagnosis not present

## 2022-02-15 DIAGNOSIS — M79674 Pain in right toe(s): Secondary | ICD-10-CM

## 2022-02-18 DIAGNOSIS — D631 Anemia in chronic kidney disease: Secondary | ICD-10-CM | POA: Diagnosis not present

## 2022-02-18 DIAGNOSIS — N2581 Secondary hyperparathyroidism of renal origin: Secondary | ICD-10-CM | POA: Diagnosis not present

## 2022-02-18 DIAGNOSIS — Z992 Dependence on renal dialysis: Secondary | ICD-10-CM | POA: Diagnosis not present

## 2022-02-18 DIAGNOSIS — N186 End stage renal disease: Secondary | ICD-10-CM | POA: Diagnosis not present

## 2022-02-20 DIAGNOSIS — Z992 Dependence on renal dialysis: Secondary | ICD-10-CM | POA: Diagnosis not present

## 2022-02-20 DIAGNOSIS — N2581 Secondary hyperparathyroidism of renal origin: Secondary | ICD-10-CM | POA: Diagnosis not present

## 2022-02-20 DIAGNOSIS — N186 End stage renal disease: Secondary | ICD-10-CM | POA: Diagnosis not present

## 2022-02-20 DIAGNOSIS — D631 Anemia in chronic kidney disease: Secondary | ICD-10-CM | POA: Diagnosis not present

## 2022-02-22 DIAGNOSIS — Z992 Dependence on renal dialysis: Secondary | ICD-10-CM | POA: Diagnosis not present

## 2022-02-22 DIAGNOSIS — N186 End stage renal disease: Secondary | ICD-10-CM | POA: Diagnosis not present

## 2022-02-22 DIAGNOSIS — D631 Anemia in chronic kidney disease: Secondary | ICD-10-CM | POA: Diagnosis not present

## 2022-02-22 DIAGNOSIS — N2581 Secondary hyperparathyroidism of renal origin: Secondary | ICD-10-CM | POA: Diagnosis not present

## 2022-02-24 NOTE — Progress Notes (Signed)
?  Subjective:  ?Patient ID: Brenda Arnold, female    DOB: [birth year],  MRN: [id number] ? ?Brenda Arnold presents to clinic today for at risk foot care with h/o NIDDM with ESRD on hemodialysis and callus(es) b/l lower extremities and painful thick toenails that are difficult to trim. Painful toenails interfere with ambulation. Aggravating factors include wearing enclosed shoe gear. Pain is relieved with periodic professional debridement. Painful calluses are aggravated when weightbearing with and without shoegear. Pain is relieved with periodic professional debridement. ? ?Patient does not monitor blood glucose daily. ? ?New problem(s): None.  ? ?PCP is Kelton Pillar, MD , and last visit was September 11, 2021. ? ?Allergies  ?Allergen Reactions  ? Iodine Shortness Of Breath  ? Other Shortness Of Breath and Anaphylaxis  ? Shellfish Allergy Shortness Of Breath  ?  Other reaction(s): Breathing Problems  ? Shellfish-Derived Products Shortness Of Breath  ? Amlodipine Swelling and Other (See Comments)  ?  Edema ?Other reaction(s): edema  ? Felodipine Other (See Comments)  ?  Headache  ? Lisinopril Cough  ? ? ?Review of Systems: Negative except as noted in the HPI. ? ?Objective: No changes noted in today's physical examination. ? ?Brenda Arnold is a pleasant 66 y.o. female in NAD. AAO X 3. ? ?Vascular Examination: ?CFT <3 seconds b/l LE. Faintly palpable DP pulses b/l. Nonpalpable PT pulses b/l. Pedal hair absent b/l. Skin temperature gradient WNL b/l. No pain with calf compression b/l. No edema b/l LE. No cyanosis or clubbing noted b/l LE. ? ?Dermatological Examination: ?Pedal integument with normal turgor, texture and tone b/l LE. No open wounds b/l. No interdigital macerations b/l. Toenails 1-5 b/l elongated, thickened, discolored with subungual debris. +Tenderness with dorsal palpation of nailplates. Hyperkeratotic lesion(s) noted submet head 5 b/l. ? ?Musculoskeletal Examination: ?Muscle strength 5/5 to  all lower extremity muscle groups bilaterally. No pain, crepitus or joint limitation noted with ROM bilateral LE. Hammertoe deformity noted 1-5 b/l. Pes planus deformity noted b/l lower extremities. ? ?Neurological Examination: ?Pt has subjective symptoms of neuropathy. Protective sensation intact 5/5 intact bilaterally with 10g monofilament b/l. ? ? ?  Latest Ref Rng & Units 03/18/2021  ?  1:12 AM  ?Hemoglobin A1C  ?Hemoglobin-A1c 4.8 - 5.6 % 5.2    ? ?Assessment/Plan: ?1. Pain due to onychomycosis of toenails of both feet   ?2. Callus   ?3. Diabetes mellitus due to underlying condition with chronic kidney disease on chronic dialysis, without long-term current use of insulin ([place])   ?  ?-Patient was evaluated and treated. All patient's and/or POA's questions/concerns answered on today's visit. ?-Toenails 1-5 b/l were debrided in length and girth with sterile nail nippers and dremel without iatrogenic bleeding.  ?-Callus(es) submet head 5 b/l pared utilizing sterile scalpel blade without complication or incident. Total number debrided =2. ?-Patient/POA to call should there be question/concern in the interim.  ? ?Return in about 3 months (around 05/18/2022). ? ?Marzetta Board, DPM  ?

## 2022-02-25 DIAGNOSIS — D631 Anemia in chronic kidney disease: Secondary | ICD-10-CM | POA: Diagnosis not present

## 2022-02-25 DIAGNOSIS — Z992 Dependence on renal dialysis: Secondary | ICD-10-CM | POA: Diagnosis not present

## 2022-02-25 DIAGNOSIS — N2581 Secondary hyperparathyroidism of renal origin: Secondary | ICD-10-CM | POA: Diagnosis not present

## 2022-02-25 DIAGNOSIS — N186 End stage renal disease: Secondary | ICD-10-CM | POA: Diagnosis not present

## 2022-02-27 DIAGNOSIS — N186 End stage renal disease: Secondary | ICD-10-CM | POA: Diagnosis not present

## 2022-02-27 DIAGNOSIS — Z992 Dependence on renal dialysis: Secondary | ICD-10-CM | POA: Diagnosis not present

## 2022-02-27 DIAGNOSIS — D631 Anemia in chronic kidney disease: Secondary | ICD-10-CM | POA: Diagnosis not present

## 2022-02-27 DIAGNOSIS — N2581 Secondary hyperparathyroidism of renal origin: Secondary | ICD-10-CM | POA: Diagnosis not present

## 2022-03-01 DIAGNOSIS — Z992 Dependence on renal dialysis: Secondary | ICD-10-CM | POA: Diagnosis not present

## 2022-03-01 DIAGNOSIS — N2581 Secondary hyperparathyroidism of renal origin: Secondary | ICD-10-CM | POA: Diagnosis not present

## 2022-03-01 DIAGNOSIS — N186 End stage renal disease: Secondary | ICD-10-CM | POA: Diagnosis not present

## 2022-03-01 DIAGNOSIS — D631 Anemia in chronic kidney disease: Secondary | ICD-10-CM | POA: Diagnosis not present

## 2022-03-02 DIAGNOSIS — N186 End stage renal disease: Secondary | ICD-10-CM | POA: Diagnosis not present

## 2022-03-02 DIAGNOSIS — Z992 Dependence on renal dialysis: Secondary | ICD-10-CM | POA: Diagnosis not present

## 2022-03-02 DIAGNOSIS — E1122 Type 2 diabetes mellitus with diabetic chronic kidney disease: Secondary | ICD-10-CM | POA: Diagnosis not present

## 2022-03-04 DIAGNOSIS — D631 Anemia in chronic kidney disease: Secondary | ICD-10-CM | POA: Diagnosis not present

## 2022-03-04 DIAGNOSIS — Z992 Dependence on renal dialysis: Secondary | ICD-10-CM | POA: Diagnosis not present

## 2022-03-04 DIAGNOSIS — N186 End stage renal disease: Secondary | ICD-10-CM | POA: Diagnosis not present

## 2022-03-04 DIAGNOSIS — N2581 Secondary hyperparathyroidism of renal origin: Secondary | ICD-10-CM | POA: Diagnosis not present

## 2022-03-06 DIAGNOSIS — D631 Anemia in chronic kidney disease: Secondary | ICD-10-CM | POA: Diagnosis not present

## 2022-03-06 DIAGNOSIS — N186 End stage renal disease: Secondary | ICD-10-CM | POA: Diagnosis not present

## 2022-03-06 DIAGNOSIS — N2581 Secondary hyperparathyroidism of renal origin: Secondary | ICD-10-CM | POA: Diagnosis not present

## 2022-03-06 DIAGNOSIS — Z992 Dependence on renal dialysis: Secondary | ICD-10-CM | POA: Diagnosis not present

## 2022-03-07 ENCOUNTER — Ambulatory Visit (HOSPITAL_COMMUNITY): Payer: Medicare Other

## 2022-03-07 ENCOUNTER — Ambulatory Visit (HOSPITAL_COMMUNITY)
Admission: RE | Admit: 2022-03-07 | Discharge: 2022-03-07 | Disposition: A | Discharge status: Home / Self Care | Payer: Medicare Other | Source: Ambulatory Visit | Attending: Nephrology | Admitting: Nephrology

## 2022-03-07 DIAGNOSIS — Z87448 Personal history of other diseases of urinary system: Secondary | ICD-10-CM | POA: Diagnosis not present

## 2022-03-07 DIAGNOSIS — Z0389 Encounter for observation for other suspected diseases and conditions ruled out: Secondary | ICD-10-CM | POA: Diagnosis not present

## 2022-03-08 DIAGNOSIS — Z992 Dependence on renal dialysis: Secondary | ICD-10-CM | POA: Diagnosis not present

## 2022-03-08 DIAGNOSIS — N2581 Secondary hyperparathyroidism of renal origin: Secondary | ICD-10-CM | POA: Diagnosis not present

## 2022-03-08 DIAGNOSIS — D631 Anemia in chronic kidney disease: Secondary | ICD-10-CM | POA: Diagnosis not present

## 2022-03-08 DIAGNOSIS — N186 End stage renal disease: Secondary | ICD-10-CM | POA: Diagnosis not present

## 2022-03-11 DIAGNOSIS — Z992 Dependence on renal dialysis: Secondary | ICD-10-CM | POA: Diagnosis not present

## 2022-03-11 DIAGNOSIS — D631 Anemia in chronic kidney disease: Secondary | ICD-10-CM | POA: Diagnosis not present

## 2022-03-11 DIAGNOSIS — N2581 Secondary hyperparathyroidism of renal origin: Secondary | ICD-10-CM | POA: Diagnosis not present

## 2022-03-11 DIAGNOSIS — N186 End stage renal disease: Secondary | ICD-10-CM | POA: Diagnosis not present

## 2022-03-13 DIAGNOSIS — Z992 Dependence on renal dialysis: Secondary | ICD-10-CM | POA: Diagnosis not present

## 2022-03-13 DIAGNOSIS — N2581 Secondary hyperparathyroidism of renal origin: Secondary | ICD-10-CM | POA: Diagnosis not present

## 2022-03-13 DIAGNOSIS — D631 Anemia in chronic kidney disease: Secondary | ICD-10-CM | POA: Diagnosis not present

## 2022-03-13 DIAGNOSIS — N186 End stage renal disease: Secondary | ICD-10-CM | POA: Diagnosis not present

## 2022-03-13 DIAGNOSIS — E1122 Type 2 diabetes mellitus with diabetic chronic kidney disease: Secondary | ICD-10-CM | POA: Diagnosis not present

## 2022-03-15 DIAGNOSIS — N186 End stage renal disease: Secondary | ICD-10-CM | POA: Diagnosis not present

## 2022-03-15 DIAGNOSIS — D631 Anemia in chronic kidney disease: Secondary | ICD-10-CM | POA: Diagnosis not present

## 2022-03-15 DIAGNOSIS — N2581 Secondary hyperparathyroidism of renal origin: Secondary | ICD-10-CM | POA: Diagnosis not present

## 2022-03-15 DIAGNOSIS — Z992 Dependence on renal dialysis: Secondary | ICD-10-CM | POA: Diagnosis not present

## 2022-03-18 DIAGNOSIS — N186 End stage renal disease: Secondary | ICD-10-CM | POA: Diagnosis not present

## 2022-03-18 DIAGNOSIS — N2581 Secondary hyperparathyroidism of renal origin: Secondary | ICD-10-CM | POA: Diagnosis not present

## 2022-03-18 DIAGNOSIS — Z992 Dependence on renal dialysis: Secondary | ICD-10-CM | POA: Diagnosis not present

## 2022-03-18 DIAGNOSIS — D631 Anemia in chronic kidney disease: Secondary | ICD-10-CM | POA: Diagnosis not present

## 2022-03-20 DIAGNOSIS — Z992 Dependence on renal dialysis: Secondary | ICD-10-CM | POA: Diagnosis not present

## 2022-03-20 DIAGNOSIS — N186 End stage renal disease: Secondary | ICD-10-CM | POA: Diagnosis not present

## 2022-03-20 DIAGNOSIS — D631 Anemia in chronic kidney disease: Secondary | ICD-10-CM | POA: Diagnosis not present

## 2022-03-20 DIAGNOSIS — N2581 Secondary hyperparathyroidism of renal origin: Secondary | ICD-10-CM | POA: Diagnosis not present

## 2022-03-22 DIAGNOSIS — N2581 Secondary hyperparathyroidism of renal origin: Secondary | ICD-10-CM | POA: Diagnosis not present

## 2022-03-22 DIAGNOSIS — Z992 Dependence on renal dialysis: Secondary | ICD-10-CM | POA: Diagnosis not present

## 2022-03-22 DIAGNOSIS — N186 End stage renal disease: Secondary | ICD-10-CM | POA: Diagnosis not present

## 2022-03-22 DIAGNOSIS — D631 Anemia in chronic kidney disease: Secondary | ICD-10-CM | POA: Diagnosis not present

## 2022-03-25 DIAGNOSIS — N186 End stage renal disease: Secondary | ICD-10-CM | POA: Diagnosis not present

## 2022-03-25 DIAGNOSIS — Z992 Dependence on renal dialysis: Secondary | ICD-10-CM | POA: Diagnosis not present

## 2022-03-25 DIAGNOSIS — D631 Anemia in chronic kidney disease: Secondary | ICD-10-CM | POA: Diagnosis not present

## 2022-03-25 DIAGNOSIS — N2581 Secondary hyperparathyroidism of renal origin: Secondary | ICD-10-CM | POA: Diagnosis not present

## 2022-03-27 DIAGNOSIS — N2581 Secondary hyperparathyroidism of renal origin: Secondary | ICD-10-CM | POA: Diagnosis not present

## 2022-03-27 DIAGNOSIS — D631 Anemia in chronic kidney disease: Secondary | ICD-10-CM | POA: Diagnosis not present

## 2022-03-27 DIAGNOSIS — N186 End stage renal disease: Secondary | ICD-10-CM | POA: Diagnosis not present

## 2022-03-27 DIAGNOSIS — Z992 Dependence on renal dialysis: Secondary | ICD-10-CM | POA: Diagnosis not present

## 2022-03-29 DIAGNOSIS — N2581 Secondary hyperparathyroidism of renal origin: Secondary | ICD-10-CM | POA: Diagnosis not present

## 2022-03-29 DIAGNOSIS — N186 End stage renal disease: Secondary | ICD-10-CM | POA: Diagnosis not present

## 2022-03-29 DIAGNOSIS — Z992 Dependence on renal dialysis: Secondary | ICD-10-CM | POA: Diagnosis not present

## 2022-03-29 DIAGNOSIS — D631 Anemia in chronic kidney disease: Secondary | ICD-10-CM | POA: Diagnosis not present

## 2022-04-01 DIAGNOSIS — E1122 Type 2 diabetes mellitus with diabetic chronic kidney disease: Secondary | ICD-10-CM | POA: Diagnosis not present

## 2022-04-01 DIAGNOSIS — D631 Anemia in chronic kidney disease: Secondary | ICD-10-CM | POA: Diagnosis not present

## 2022-04-01 DIAGNOSIS — N2581 Secondary hyperparathyroidism of renal origin: Secondary | ICD-10-CM | POA: Diagnosis not present

## 2022-04-01 DIAGNOSIS — N186 End stage renal disease: Secondary | ICD-10-CM | POA: Diagnosis not present

## 2022-04-01 DIAGNOSIS — Z992 Dependence on renal dialysis: Secondary | ICD-10-CM | POA: Diagnosis not present

## 2022-04-03 DIAGNOSIS — Z992 Dependence on renal dialysis: Secondary | ICD-10-CM | POA: Diagnosis not present

## 2022-04-03 DIAGNOSIS — D631 Anemia in chronic kidney disease: Secondary | ICD-10-CM | POA: Diagnosis not present

## 2022-04-03 DIAGNOSIS — N186 End stage renal disease: Secondary | ICD-10-CM | POA: Diagnosis not present

## 2022-04-03 DIAGNOSIS — N2581 Secondary hyperparathyroidism of renal origin: Secondary | ICD-10-CM | POA: Diagnosis not present

## 2022-04-05 DIAGNOSIS — N186 End stage renal disease: Secondary | ICD-10-CM | POA: Diagnosis not present

## 2022-04-05 DIAGNOSIS — N2581 Secondary hyperparathyroidism of renal origin: Secondary | ICD-10-CM | POA: Diagnosis not present

## 2022-04-05 DIAGNOSIS — Z992 Dependence on renal dialysis: Secondary | ICD-10-CM | POA: Diagnosis not present

## 2022-04-05 DIAGNOSIS — D631 Anemia in chronic kidney disease: Secondary | ICD-10-CM | POA: Diagnosis not present

## 2022-04-08 DIAGNOSIS — N2581 Secondary hyperparathyroidism of renal origin: Secondary | ICD-10-CM | POA: Diagnosis not present

## 2022-04-08 DIAGNOSIS — Z992 Dependence on renal dialysis: Secondary | ICD-10-CM | POA: Diagnosis not present

## 2022-04-08 DIAGNOSIS — N186 End stage renal disease: Secondary | ICD-10-CM | POA: Diagnosis not present

## 2022-04-08 DIAGNOSIS — D631 Anemia in chronic kidney disease: Secondary | ICD-10-CM | POA: Diagnosis not present

## 2022-04-10 DIAGNOSIS — N2581 Secondary hyperparathyroidism of renal origin: Secondary | ICD-10-CM | POA: Diagnosis not present

## 2022-04-10 DIAGNOSIS — Z992 Dependence on renal dialysis: Secondary | ICD-10-CM | POA: Diagnosis not present

## 2022-04-10 DIAGNOSIS — D631 Anemia in chronic kidney disease: Secondary | ICD-10-CM | POA: Diagnosis not present

## 2022-04-10 DIAGNOSIS — N186 End stage renal disease: Secondary | ICD-10-CM | POA: Diagnosis not present

## 2022-04-12 DIAGNOSIS — N186 End stage renal disease: Secondary | ICD-10-CM | POA: Diagnosis not present

## 2022-04-12 DIAGNOSIS — Z992 Dependence on renal dialysis: Secondary | ICD-10-CM | POA: Diagnosis not present

## 2022-04-12 DIAGNOSIS — D631 Anemia in chronic kidney disease: Secondary | ICD-10-CM | POA: Diagnosis not present

## 2022-04-12 DIAGNOSIS — N2581 Secondary hyperparathyroidism of renal origin: Secondary | ICD-10-CM | POA: Diagnosis not present

## 2022-04-15 ENCOUNTER — Other Ambulatory Visit: Payer: Self-pay

## 2022-04-15 ENCOUNTER — Encounter (HOSPITAL_COMMUNITY): Payer: Self-pay

## 2022-04-15 ENCOUNTER — Emergency Department (HOSPITAL_COMMUNITY): Payer: No Typology Code available for payment source

## 2022-04-15 ENCOUNTER — Inpatient Hospital Stay (HOSPITAL_COMMUNITY)
Admission: EM | Admit: 2022-04-15 | Discharge: 2022-04-18 | DRG: 913 | Disposition: A | Discharge status: Home / Self Care | Payer: No Typology Code available for payment source | Attending: General Surgery | Admitting: General Surgery

## 2022-04-15 DIAGNOSIS — Z79899 Other long term (current) drug therapy: Secondary | ICD-10-CM

## 2022-04-15 DIAGNOSIS — D631 Anemia in chronic kidney disease: Secondary | ICD-10-CM | POA: Diagnosis present

## 2022-04-15 DIAGNOSIS — Z7901 Long term (current) use of anticoagulants: Secondary | ICD-10-CM

## 2022-04-15 DIAGNOSIS — Z992 Dependence on renal dialysis: Secondary | ICD-10-CM | POA: Diagnosis not present

## 2022-04-15 DIAGNOSIS — R109 Unspecified abdominal pain: Secondary | ICD-10-CM | POA: Diagnosis present

## 2022-04-15 DIAGNOSIS — E1122 Type 2 diabetes mellitus with diabetic chronic kidney disease: Secondary | ICD-10-CM | POA: Diagnosis present

## 2022-04-15 DIAGNOSIS — N2581 Secondary hyperparathyroidism of renal origin: Secondary | ICD-10-CM | POA: Diagnosis present

## 2022-04-15 DIAGNOSIS — Z801 Family history of malignant neoplasm of trachea, bronchus and lung: Secondary | ICD-10-CM

## 2022-04-15 DIAGNOSIS — I480 Paroxysmal atrial fibrillation: Secondary | ICD-10-CM | POA: Diagnosis present

## 2022-04-15 DIAGNOSIS — I120 Hypertensive chronic kidney disease with stage 5 chronic kidney disease or end stage renal disease: Secondary | ICD-10-CM | POA: Diagnosis present

## 2022-04-15 DIAGNOSIS — Z041 Encounter for examination and observation following transport accident: Secondary | ICD-10-CM | POA: Diagnosis not present

## 2022-04-15 DIAGNOSIS — S36498A Other injury of other part of small intestine, initial encounter: Secondary | ICD-10-CM | POA: Diagnosis not present

## 2022-04-15 DIAGNOSIS — Z8249 Family history of ischemic heart disease and other diseases of the circulatory system: Secondary | ICD-10-CM | POA: Diagnosis not present

## 2022-04-15 DIAGNOSIS — Z96643 Presence of artificial hip joint, bilateral: Secondary | ICD-10-CM | POA: Diagnosis present

## 2022-04-15 DIAGNOSIS — R0789 Other chest pain: Secondary | ICD-10-CM | POA: Diagnosis not present

## 2022-04-15 DIAGNOSIS — E785 Hyperlipidemia, unspecified: Secondary | ICD-10-CM | POA: Diagnosis present

## 2022-04-15 DIAGNOSIS — I959 Hypotension, unspecified: Secondary | ICD-10-CM | POA: Diagnosis not present

## 2022-04-15 DIAGNOSIS — Z86718 Personal history of other venous thrombosis and embolism: Secondary | ICD-10-CM

## 2022-04-15 DIAGNOSIS — M542 Cervicalgia: Secondary | ICD-10-CM | POA: Diagnosis not present

## 2022-04-15 DIAGNOSIS — S20219A Contusion of unspecified front wall of thorax, initial encounter: Secondary | ICD-10-CM | POA: Diagnosis not present

## 2022-04-15 DIAGNOSIS — G4730 Sleep apnea, unspecified: Secondary | ICD-10-CM | POA: Diagnosis present

## 2022-04-15 DIAGNOSIS — Z6838 Body mass index (BMI) 38.0-38.9, adult: Secondary | ICD-10-CM

## 2022-04-15 DIAGNOSIS — M1990 Unspecified osteoarthritis, unspecified site: Secondary | ICD-10-CM | POA: Diagnosis present

## 2022-04-15 DIAGNOSIS — Z841 Family history of disorders of kidney and ureter: Secondary | ICD-10-CM

## 2022-04-15 DIAGNOSIS — Z888 Allergy status to other drugs, medicaments and biological substances status: Secondary | ICD-10-CM | POA: Diagnosis not present

## 2022-04-15 DIAGNOSIS — Z833 Family history of diabetes mellitus: Secondary | ICD-10-CM

## 2022-04-15 DIAGNOSIS — E669 Obesity, unspecified: Secondary | ICD-10-CM | POA: Diagnosis present

## 2022-04-15 DIAGNOSIS — D62 Acute posthemorrhagic anemia: Secondary | ICD-10-CM | POA: Diagnosis not present

## 2022-04-15 DIAGNOSIS — Z91013 Allergy to seafood: Secondary | ICD-10-CM

## 2022-04-15 DIAGNOSIS — N250 Renal osteodystrophy: Secondary | ICD-10-CM | POA: Diagnosis not present

## 2022-04-15 DIAGNOSIS — S36892A Contusion of other intra-abdominal organs, initial encounter: Principal | ICD-10-CM | POA: Diagnosis present

## 2022-04-15 DIAGNOSIS — I9589 Other hypotension: Secondary | ICD-10-CM | POA: Diagnosis present

## 2022-04-15 DIAGNOSIS — N186 End stage renal disease: Secondary | ICD-10-CM | POA: Diagnosis not present

## 2022-04-15 DIAGNOSIS — N2889 Other specified disorders of kidney and ureter: Secondary | ICD-10-CM | POA: Diagnosis not present

## 2022-04-15 DIAGNOSIS — E282 Polycystic ovarian syndrome: Secondary | ICD-10-CM | POA: Diagnosis present

## 2022-04-15 DIAGNOSIS — R079 Chest pain, unspecified: Secondary | ICD-10-CM | POA: Diagnosis not present

## 2022-04-15 DIAGNOSIS — S3993XA Unspecified injury of pelvis, initial encounter: Secondary | ICD-10-CM | POA: Diagnosis not present

## 2022-04-15 DIAGNOSIS — I700 Atherosclerosis of aorta: Secondary | ICD-10-CM | POA: Diagnosis not present

## 2022-04-15 DIAGNOSIS — I2510 Atherosclerotic heart disease of native coronary artery without angina pectoris: Secondary | ICD-10-CM | POA: Diagnosis not present

## 2022-04-15 DIAGNOSIS — K668 Other specified disorders of peritoneum: Secondary | ICD-10-CM | POA: Diagnosis not present

## 2022-04-15 DIAGNOSIS — N261 Atrophy of kidney (terminal): Secondary | ICD-10-CM | POA: Diagnosis not present

## 2022-04-15 DIAGNOSIS — K661 Hemoperitoneum: Principal | ICD-10-CM

## 2022-04-15 DIAGNOSIS — J8410 Pulmonary fibrosis, unspecified: Secondary | ICD-10-CM | POA: Diagnosis not present

## 2022-04-15 DIAGNOSIS — S3991XA Unspecified injury of abdomen, initial encounter: Secondary | ICD-10-CM | POA: Diagnosis not present

## 2022-04-15 LAB — CBC WITH DIFFERENTIAL/PLATELET
Abs Immature Granulocytes: 0.05 10*3/uL (ref 0.00–0.07)
Basophils Absolute: 0 10*3/uL (ref 0.0–0.1)
Basophils Relative: 0 %
Eosinophils Absolute: 0 10*3/uL (ref 0.0–0.5)
Eosinophils Relative: 0 %
HCT: 32.4 % — ABNORMAL LOW (ref 36.0–46.0)
Hemoglobin: 10.4 g/dL — ABNORMAL LOW (ref 12.0–15.0)
Immature Granulocytes: 0 %
Lymphocytes Relative: 7 %
Lymphs Abs: 0.8 10*3/uL (ref 0.7–4.0)
MCH: 32.4 pg (ref 26.0–34.0)
MCHC: 32.1 g/dL (ref 30.0–36.0)
MCV: 100.9 fL — ABNORMAL HIGH (ref 80.0–100.0)
Monocytes Absolute: 0.7 10*3/uL (ref 0.1–1.0)
Monocytes Relative: 7 %
Neutro Abs: 9.5 10*3/uL — ABNORMAL HIGH (ref 1.7–7.7)
Neutrophils Relative %: 86 %
Platelets: 121 10*3/uL — ABNORMAL LOW (ref 150–400)
RBC: 3.21 MIL/uL — ABNORMAL LOW (ref 3.87–5.11)
RDW: 15.7 % — ABNORMAL HIGH (ref 11.5–15.5)
WBC: 11.1 10*3/uL — ABNORMAL HIGH (ref 4.0–10.5)
nRBC: 0 % (ref 0.0–0.2)

## 2022-04-15 LAB — BASIC METABOLIC PANEL
Anion gap: 14 (ref 5–15)
BUN: 28 mg/dL — ABNORMAL HIGH (ref 8–23)
CO2: 31 mmol/L (ref 22–32)
Calcium: 8.8 mg/dL — ABNORMAL LOW (ref 8.9–10.3)
Chloride: 96 mmol/L — ABNORMAL LOW (ref 98–111)
Creatinine, Ser: 6.94 mg/dL — ABNORMAL HIGH (ref 0.44–1.00)
GFR, Estimated: 6 mL/min — ABNORMAL LOW (ref >60)
Glucose, Bld: 119 mg/dL — ABNORMAL HIGH (ref 70–99)
Potassium: 3.5 mmol/L (ref 3.5–5.1)
Sodium: 141 mmol/L (ref 135–145)

## 2022-04-15 LAB — CBC
HCT: 32.3 % — ABNORMAL LOW (ref 36.0–46.0)
Hemoglobin: 10.5 g/dL — ABNORMAL LOW (ref 12.0–15.0)
MCH: 32.9 pg (ref 26.0–34.0)
MCHC: 32.5 g/dL (ref 30.0–36.0)
MCV: 101.3 fL — ABNORMAL HIGH (ref 80.0–100.0)
Platelets: 144 10*3/uL — ABNORMAL LOW (ref 150–400)
RBC: 3.19 MIL/uL — ABNORMAL LOW (ref 3.87–5.11)
RDW: 15.6 % — ABNORMAL HIGH (ref 11.5–15.5)
WBC: 15.2 10*3/uL — ABNORMAL HIGH (ref 4.0–10.5)
nRBC: 0 % (ref 0.0–0.2)

## 2022-04-15 LAB — SAMPLE TO BLOOD BANK

## 2022-04-15 LAB — TYPE AND SCREEN
ABO/RH(D): A POS
Antibody Screen: NEGATIVE

## 2022-04-15 MED ORDER — ONDANSETRON HCL 4 MG/2ML IJ SOLN: 4.0000 mg | Freq: Four times a day (QID) | INTRAMUSCULAR | Status: DC | PRN

## 2022-04-15 MED ORDER — COLCHICINE 0.6 MG PO TABS
0.3000 mg | ORAL_TABLET | ORAL | Status: DC
  Administered 2022-04-17: 0.3 mg via ORAL
  Filled 2022-04-15: qty 0.5

## 2022-04-15 MED ORDER — FENTANYL CITRATE PF 50 MCG/ML IJ SOSY: 25.0000 ug | PREFILLED_SYRINGE | INTRAMUSCULAR | Status: DC | PRN

## 2022-04-15 MED ORDER — FLUTICASONE PROPIONATE 50 MCG/ACT NA SUSP: 2.0000 | Freq: Every day | NASAL | Status: DC

## 2022-04-15 MED ORDER — AMIODARONE HCL 200 MG PO TABS
200.0000 mg | ORAL_TABLET | Freq: Every day | ORAL | Status: DC
  Administered 2022-04-15 – 2022-04-18 (×2): 200 mg via ORAL
  Filled 2022-04-15 (×2): qty 1

## 2022-04-15 MED ORDER — SODIUM CHLORIDE 0.9 % IV BOLUS
500.0000 mL | Freq: Once | INTRAVENOUS | Status: AC
  Administered 2022-04-15: 500 mL via INTRAVENOUS

## 2022-04-15 MED ORDER — LACTATED RINGERS IV BOLUS
500.0000 mL | Freq: Once | INTRAVENOUS | Status: AC
Start: 2022-04-15 — End: 2022-04-15
  Administered 2022-04-15: 500 mL via INTRAVENOUS

## 2022-04-15 MED ORDER — ONDANSETRON 4 MG PO TBDP: 4.0000 mg | ORAL_TABLET | Freq: Four times a day (QID) | ORAL | Status: DC | PRN

## 2022-04-15 MED ORDER — MIDODRINE HCL 5 MG PO TABS
10.0000 mg | ORAL_TABLET | Freq: Three times a day (TID) | ORAL | Status: DC
  Administered 2022-04-15 – 2022-04-18 (×9): 10 mg via ORAL
  Filled 2022-04-15 (×9): qty 2

## 2022-04-15 MED ORDER — DOCUSATE SODIUM 100 MG PO CAPS
100.0000 mg | ORAL_CAPSULE | Freq: Two times a day (BID) | ORAL | Status: DC
  Administered 2022-04-15 – 2022-04-18 (×4): 100 mg via ORAL
  Filled 2022-04-15 (×5): qty 1

## 2022-04-15 MED ORDER — IOHEXOL 300 MG/ML  SOLN
100.0000 mL | Freq: Once | INTRAMUSCULAR | Status: AC | PRN
  Administered 2022-04-15: 100 mL via INTRAVENOUS

## 2022-04-15 MED ORDER — SUCROFERRIC OXYHYDROXIDE 500 MG PO CHEW
1000.0000 mg | CHEWABLE_TABLET | Freq: Three times a day (TID) | ORAL | Status: DC
  Administered 2022-04-16 – 2022-04-18 (×6): 1000 mg via ORAL
  Filled 2022-04-15 (×9): qty 2

## 2022-04-15 MED ORDER — GABAPENTIN 100 MG PO CAPS
100.0000 mg | ORAL_CAPSULE | Freq: Every day | ORAL | Status: DC
  Administered 2022-04-15 – 2022-04-17 (×3): 100 mg via ORAL
  Filled 2022-04-15 (×3): qty 1

## 2022-04-15 MED ORDER — ACETAMINOPHEN 500 MG PO TABS
1000.0000 mg | ORAL_TABLET | Freq: Four times a day (QID) | ORAL | Status: DC
  Administered 2022-04-15 – 2022-04-18 (×4): 1000 mg via ORAL
  Filled 2022-04-15 (×5): qty 2

## 2022-04-15 MED ORDER — METHOCARBAMOL 500 MG PO TABS: 500.0000 mg | ORAL_TABLET | Freq: Three times a day (TID) | ORAL | Status: DC | PRN

## 2022-04-15 MED ORDER — LORATADINE 10 MG PO TABS
10.0000 mg | ORAL_TABLET | Freq: Every day | ORAL | Status: DC
  Filled 2022-04-15 (×3): qty 1

## 2022-04-15 MED ORDER — MORPHINE SULFATE (PF) 2 MG/ML IV SOLN: 2.0000 mg | INTRAVENOUS | Status: DC | PRN

## 2022-04-15 MED ORDER — SODIUM CHLORIDE (PF) 0.9 % IJ SOLN
INTRAMUSCULAR | Status: AC
  Filled 2022-04-15: qty 50

## 2022-04-15 MED ORDER — BISACODYL 5 MG PO TBEC: 5.0000 mg | DELAYED_RELEASE_TABLET | Freq: Every day | ORAL | Status: DC | PRN

## 2022-04-15 MED ORDER — OXYCODONE HCL 5 MG PO TABS: 2.5000 mg | ORAL_TABLET | ORAL | Status: DC | PRN

## 2022-04-15 NOTE — ED Notes (Signed)
RN messaged admitting regarding pt's BP and asked for increase level of care. Per Lovick MD pt is baseline hypotensive and missed a midodrine dose. Order is in for vitals Q4.  ?
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
## 2022-04-15 NOTE — ED Notes (Signed)
Pt called this RN in room stating she didn't feel good. This RN reassuring pt that we would be getting her back to CT soon. Pt then began having seizure like activity (focal and tonic). Pt had period of what seemed to be unconsciousness. MD aware and in room. Order for 538mL bolus ordered and given. Pt back alert and able to tell us her name. Orders placed by MD.  ?
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
## 2022-04-15 NOTE — ED Notes (Signed)
Dr Bobbye Morton at bedside ?

## 2022-04-15 NOTE — ED Notes (Signed)
Agricultural consultant at [facility] ED called multiple times to give report on this patient with no answer. Secretary at [facility] ED answered and let charge nurse know this patient was enroute to their facility.  ?
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
## 2022-04-16 LAB — CBC WITH DIFFERENTIAL/PLATELET
Abs Immature Granulocytes: 0.03 10*3/uL (ref 0.00–0.07)
Basophils Absolute: 0 10*3/uL (ref 0.0–0.1)
Basophils Relative: 0 %
Eosinophils Absolute: 0.1 10*3/uL (ref 0.0–0.5)
Eosinophils Relative: 1 %
HCT: 30.4 % — ABNORMAL LOW (ref 36.0–46.0)
Hemoglobin: 9.4 g/dL — ABNORMAL LOW (ref 12.0–15.0)
Immature Granulocytes: 0 %
Lymphocytes Relative: 16 %
Lymphs Abs: 1.8 10*3/uL (ref 0.7–4.0)
MCH: 31.4 pg (ref 26.0–34.0)
MCHC: 30.9 g/dL (ref 30.0–36.0)
MCV: 101.7 fL — ABNORMAL HIGH (ref 80.0–100.0)
Monocytes Absolute: 1.3 10*3/uL — ABNORMAL HIGH (ref 0.1–1.0)
Monocytes Relative: 12 %
Neutro Abs: 7.9 10*3/uL — ABNORMAL HIGH (ref 1.7–7.7)
Neutrophils Relative %: 71 %
Platelets: 139 10*3/uL — ABNORMAL LOW (ref 150–400)
RBC: 2.99 MIL/uL — ABNORMAL LOW (ref 3.87–5.11)
RDW: 15.5 % (ref 11.5–15.5)
WBC: 11.2 10*3/uL — ABNORMAL HIGH (ref 4.0–10.5)
nRBC: 0 % (ref 0.0–0.2)

## 2022-04-16 LAB — GLUCOSE, CAPILLARY
Glucose-Capillary: 69 mg/dL — ABNORMAL LOW (ref 70–99)
Glucose-Capillary: 112 mg/dL — ABNORMAL HIGH (ref 70–99)
Glucose-Capillary: 87 mg/dL (ref 70–99)
Glucose-Capillary: 81 mg/dL (ref 70–99)
Glucose-Capillary: 74 mg/dL (ref 70–99)
Glucose-Capillary: 92 mg/dL (ref 70–99)
Glucose-Capillary: 142 mg/dL — ABNORMAL HIGH (ref 70–99)

## 2022-04-16 LAB — HIV ANTIBODY (ROUTINE TESTING W REFLEX): HIV Screen 4th Generation wRfx: NONREACTIVE

## 2022-04-16 LAB — HEPATITIS B SURFACE ANTIGEN: Hepatitis B Surface Ag: NONREACTIVE

## 2022-04-16 LAB — MRSA NEXT GEN BY PCR, NASAL: MRSA by PCR Next Gen: NOT DETECTED

## 2022-04-16 LAB — BLOOD PRODUCT ORDER (VERBAL) VERIFICATION

## 2022-04-16 LAB — HEPATITIS B SURFACE ANTIBODY,QUALITATIVE: Hep B S Ab: REACTIVE — AB

## 2022-04-16 MED ORDER — ALBUMIN HUMAN 25 % IV SOLN
12.5000 g | Freq: Once | INTRAVENOUS | Status: DC
  Filled 2022-04-16: qty 50

## 2022-04-16 MED ORDER — SODIUM CHLORIDE 0.9% IV SOLUTION: Freq: Once | INTRAVENOUS | Status: DC

## 2022-04-16 MED ORDER — DEXTROSE 50 % IV SOLN
INTRAVENOUS | Status: AC
  Filled 2022-04-16: qty 50

## 2022-04-16 MED ORDER — CHLORHEXIDINE GLUCONATE CLOTH 2 % EX PADS: 6.0000 | MEDICATED_PAD | Freq: Every day | CUTANEOUS | Status: DC

## 2022-04-16 MED ORDER — ALBUMIN HUMAN 25 % IV SOLN
12.5000 g | Freq: Once | INTRAVENOUS | Status: AC
Start: 2022-04-16 — End: 2022-04-16
  Administered 2022-04-16: 12.5 g via INTRAVENOUS
  Filled 2022-04-16: qty 50

## 2022-04-16 MED ORDER — DEXTROSE 50 % IV SOLN
12.5000 g | INTRAVENOUS | Status: AC
  Administered 2022-04-16: 12.5 g via INTRAVENOUS

## 2022-04-16 MED ORDER — ALBUMIN HUMAN 25 % IV SOLN
250.0000 mL | Freq: Once | INTRAVENOUS | Status: DC
  Filled 2022-04-16: qty 300

## 2022-04-16 MED ORDER — ALBUMIN HUMAN 5 % IV SOLN
12.5000 g | Freq: Once | INTRAVENOUS | Status: AC
  Administered 2022-04-16: 12.5 g via INTRAVENOUS
  Filled 2022-04-16: qty 250

## 2022-04-16 MED ORDER — DIPHENHYDRAMINE HCL 12.5 MG/5ML PO ELIX
12.5000 mg | ORAL_SOLUTION | ORAL | Status: DC | PRN
  Administered 2022-04-16: 12.5 mg via ORAL
  Filled 2022-04-16 (×2): qty 5
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
## 2022-04-16 NOTE — Progress Notes (Signed)
?  Transition of Care (TOC) Screening Note ? ? ?Patient Details  ?Name: Brenda Arnold ?Date of Birth: [birth year] ? ? ?Transition of Care [facility]) CM/SW Contact:    ?Ella Bodo, RN ?Phone Number: ?04/16/2022, 3:21 PM ? ? ? ?Transition of Care Department [facility]) has reviewed patient and no TOC needs have been identified at this time. We will continue to monitor patient advancement through interdisciplinary progression rounds. If new patient transition needs arise, please place a TOC consult. ? ?Reinaldo Raddle, RN, BSN  ?Trauma/Neuro ICU Case Manager ?[phone] ? ?

## 2022-04-16 NOTE — ED Notes (Signed)
Trauma MD messaged regarding the MAP for the pt trending down  ?
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
## 2022-04-16 NOTE — TOC CAGE-AID Note (Signed)
Transition of Care (TOC) - CAGE-AID Screening ? ? ?Patient Details  ?Name: Brenda Arnold ?MRN: [id number] ?Date of Birth: 1956/04/21 ? ?Transition of Care (TOC) CM/SW Contact:    ?Falana Clagg C Tarpley-Carter, LCSWA ?Phone Number: ?04/16/2022, 9:03 AM ? ? ?Clinical Narrative: ?Pt participated in [place].  Pt stated she does not use substance or ETOH.  Pt was not offered resources, due to no usage of substance or ETOH.   ? ?Passenger transport manager, MSW, LCSW-A ?Pronouns:  She/Her/Hers ?[facility]itions of Care ?Clinical Social Worker ?Direct Number:  [phone] ?[email or web address] ? ?CAGE-AID Screening: ?  ? ?Have You Ever Felt You Ought to Cut Down on Your Drinking or Drug Use?: No ?Have People Annoyed You By Critizing Your Drinking Or Drug Use?: No ?Have You Felt Bad Or Guilty About Your Drinking Or Drug Use?: No ?Have You Ever Had a Drink or Used Drugs First Thing In The Morning to Steady Your Nerves or to Get Rid of a Hangover?: No ?CAGE-AID Score: 0 ? ?Substance Abuse Education Offered: No ? ?  ? ? ? ? ? ? ?
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
## 2022-04-16 NOTE — ED Notes (Signed)
Rancour MD made aware of pt's low BP. Pt is aox4, denies pain. ?
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
## 2022-04-16 NOTE — Progress Notes (Signed)
Patient ID: Brenda Arnold, female   DOB: July 22, 1956, 66 y.o.   MRN: [id number] ?Doing well. Asking to advance diet. Abd soft and not sig tender now. Will try renal diet. ? ?Georganna Skeans, MD, MPH, FACS ?Please use [email or web address] to contact on call provider ? ?

## 2022-04-17 LAB — CBC
HCT: 28.8 % — ABNORMAL LOW (ref 36.0–46.0)
Hemoglobin: 9.3 g/dL — ABNORMAL LOW (ref 12.0–15.0)
MCH: 31.3 pg (ref 26.0–34.0)
MCHC: 32.3 g/dL (ref 30.0–36.0)
MCV: 97 fL (ref 80.0–100.0)
Platelets: 149 10*3/uL — ABNORMAL LOW (ref 150–400)
RBC: 2.97 MIL/uL — ABNORMAL LOW (ref 3.87–5.11)
RDW: 16 % — ABNORMAL HIGH (ref 11.5–15.5)
WBC: 9.4 10*3/uL (ref 4.0–10.5)
nRBC: 0 % (ref 0.0–0.2)

## 2022-04-17 LAB — GLUCOSE, CAPILLARY
Glucose-Capillary: 113 mg/dL — ABNORMAL HIGH (ref 70–99)
Glucose-Capillary: 91 mg/dL (ref 70–99)
Glucose-Capillary: 138 mg/dL — ABNORMAL HIGH (ref 70–99)

## 2022-04-17 LAB — BASIC METABOLIC PANEL
Anion gap: 12 (ref 5–15)
BUN: 43 mg/dL — ABNORMAL HIGH (ref 8–23)
CO2: 28 mmol/L (ref 22–32)
Calcium: 8.3 mg/dL — ABNORMAL LOW (ref 8.9–10.3)
Chloride: 96 mmol/L — ABNORMAL LOW (ref 98–111)
Creatinine, Ser: 10.69 mg/dL — ABNORMAL HIGH (ref 0.44–1.00)
GFR, Estimated: 4 mL/min — ABNORMAL LOW (ref >60)
Glucose, Bld: 99 mg/dL (ref 70–99)
Potassium: 4.2 mmol/L (ref 3.5–5.1)
Sodium: 136 mmol/L (ref 135–145)

## 2022-04-17 LAB — BPAM RBC
Blood Product Expiration Date: 202306072359
ISSUE DATE / TIME: 202305160116
Unit Type and Rh: 5100

## 2022-04-17 LAB — TYPE AND SCREEN
ABO/RH(D): A POS
Antibody Screen: NEGATIVE
Unit division: 0

## 2022-04-17 LAB — HEPATITIS B SURFACE ANTIBODY, QUANTITATIVE: Hep B S AB Quant (Post): 522.9 m[IU]/mL (ref >9.9)

## 2022-04-17 MED ORDER — ALBUMIN HUMAN 25 % IV SOLN
12.5000 g | Freq: Once | INTRAVENOUS | Status: AC
  Administered 2022-04-17: 12.5 g via INTRAVENOUS
  Filled 2022-04-17: qty 50

## 2022-04-17 MED ORDER — MIDODRINE HCL 5 MG PO TABS
10.0000 mg | ORAL_TABLET | ORAL | Status: DC | PRN
  Administered 2022-04-17: 10 mg via ORAL
  Filled 2022-04-17: qty 2

## 2022-04-17 MED ORDER — HEPARIN SODIUM (PORCINE) 5000 UNIT/ML IJ SOLN
5000.0000 [IU] | Freq: Three times a day (TID) | INTRAMUSCULAR | Status: DC
  Administered 2022-04-17 – 2022-04-18 (×3): 5000 [IU] via SUBCUTANEOUS
  Filled 2022-04-17 (×3): qty 1
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
## 2022-04-17 NOTE — Progress Notes (Signed)
Pt receives out-pt HD at [facility] on MWF. Pt arrives at 5:20 for 5:40 chair time. Met with pt at bedside. Pt states she normally drives herself to HD and plans to talk to friends/neighbors to see if they can assist pt with transportation until pt able to get her own transportation. Will assist as needed.  ? ?Melven Sartorius ?Renal Navigator ?[phone] ?

## 2022-04-17 NOTE — Progress Notes (Signed)
removed 900 mls net fluid unable to remove more due to severe hypotension less than 85 systolic.  pre bp 09/38 post bp 88/46 gave 10 mg midodrine to support blood pressure beginning of tx.  pre weight 112.0kg post weight 111.0kg bed scales.  2 bandages to left forearm avf . no bleeding dressing cdi. ?
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
## 2022-04-18 LAB — CBC
HCT: 28.9 % — ABNORMAL LOW (ref 36.0–46.0)
Hemoglobin: 9.3 g/dL — ABNORMAL LOW (ref 12.0–15.0)
MCH: 31.2 pg (ref 26.0–34.0)
MCHC: 32.2 g/dL (ref 30.0–36.0)
MCV: 97 fL (ref 80.0–100.0)
Platelets: 129 10*3/uL — ABNORMAL LOW (ref 150–400)
RBC: 2.98 MIL/uL — ABNORMAL LOW (ref 3.87–5.11)
RDW: 15.7 % — ABNORMAL HIGH (ref 11.5–15.5)
WBC: 8.5 10*3/uL (ref 4.0–10.5)
nRBC: 0 % (ref 0.0–0.2)

## 2022-04-18 LAB — GLUCOSE, CAPILLARY
Glucose-Capillary: 74 mg/dL (ref 70–99)
Glucose-Capillary: 78 mg/dL (ref 70–99)

## 2022-04-18 MED ORDER — BISACODYL 5 MG PO TBEC: 5.0000 mg | DELAYED_RELEASE_TABLET | Freq: Every day | ORAL | Status: AC | PRN

## 2022-04-18 MED ORDER — ACETAMINOPHEN 500 MG PO TABS
1000.0000 mg | ORAL_TABLET | Freq: Three times a day (TID) | ORAL | Status: AC | PRN
Start: 2022-04-18 — End: ?

## 2022-04-18 MED ORDER — ELIQUIS 5 MG PO TABS: 5.0000 mg | ORAL_TABLET | Freq: Two times a day (BID) | ORAL | 3 refills | Status: AC

## 2022-04-18 NOTE — Progress Notes (Signed)
Discharge instructions given and ride here to pick her up.  She had no additional questions.
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
## 2022-04-18 NOTE — Progress Notes (Signed)
Mobility Specialist: Progress Note   04/18/22 1019  Mobility  Activity Ambulated with assistance in hallway  Level of Assistance Modified independent, requires aide device or extra time  Assistive Device Front wheel walker  Distance Ambulated (ft) 280 ft  Activity Response Tolerated well  $Mobility charge 1 Mobility   Pre-Mobility: 76 HR, 81/69 (74) BP, 99% SpO2 Post-Mobility: 83 HR, 109/50 (66) BP, 99% SpO2  Received pt in bed having no complaints and agreeable to mobility. Asymptomatic throughout ambulation, returned back to bed w/ call bell in reach and all needs met.  [facility] Brenda Arnold Mobility Specialist Mobility Specialist 5 North: [phone] Mobility Specialist 6 North: [phone]
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
## 2022-04-18 NOTE — Progress Notes (Signed)
Pt to d/c to home today. Contacted by renal PA with request to speak to pt prior to d/c regarding transportation to HD in the am. Spoke to pt via phone. Pt confirms that she has transportation home from HD appt tomorrow. Pt states that she is going to speak with a neighbor and church friends to see if one of them can transport pt to HD appt tomorrow am. Offered to call clinic to see if they have a later appt for pt tomorrow but pt declined. Pt wants to keep her regular appt time. Pt states she may have to use uber or cab if she cannot find someone to transport to HD in the am. Messaged RN CM to inquire if hospital assists pts with cab vouchers in such situations. Spoke to [place] at [facility] to make clinic aware of pt's d/c today and that pt should resume care tomorrow. Clinic also advised of above situation.   Melven Sartorius Renal Navigator [phone]

## 2022-04-18 NOTE — Care Management Important Message (Signed)
Important Message  Patient Details  Name: Brenda Arnold MRN: [id number] Date of Birth: September 01, 1956   Medicare Important Message Given:  Yes     Orbie Pyo 04/18/2022, 12:48 PM

## 2022-04-19 DIAGNOSIS — Z992 Dependence on renal dialysis: Secondary | ICD-10-CM | POA: Diagnosis not present

## 2022-04-19 DIAGNOSIS — N186 End stage renal disease: Secondary | ICD-10-CM | POA: Diagnosis not present

## 2022-04-19 DIAGNOSIS — I959 Hypotension, unspecified: Secondary | ICD-10-CM | POA: Diagnosis not present

## 2022-04-19 DIAGNOSIS — N2581 Secondary hyperparathyroidism of renal origin: Secondary | ICD-10-CM | POA: Diagnosis not present

## 2022-04-19 DIAGNOSIS — D631 Anemia in chronic kidney disease: Secondary | ICD-10-CM | POA: Diagnosis not present

## 2022-04-22 DIAGNOSIS — N2581 Secondary hyperparathyroidism of renal origin: Secondary | ICD-10-CM | POA: Diagnosis not present

## 2022-04-22 DIAGNOSIS — Z992 Dependence on renal dialysis: Secondary | ICD-10-CM | POA: Diagnosis not present

## 2022-04-22 DIAGNOSIS — D631 Anemia in chronic kidney disease: Secondary | ICD-10-CM | POA: Diagnosis not present

## 2022-04-22 DIAGNOSIS — N186 End stage renal disease: Secondary | ICD-10-CM | POA: Diagnosis not present

## 2022-04-24 DIAGNOSIS — N186 End stage renal disease: Secondary | ICD-10-CM | POA: Diagnosis not present

## 2022-04-24 DIAGNOSIS — D631 Anemia in chronic kidney disease: Secondary | ICD-10-CM | POA: Diagnosis not present

## 2022-04-24 DIAGNOSIS — N2581 Secondary hyperparathyroidism of renal origin: Secondary | ICD-10-CM | POA: Diagnosis not present

## 2022-04-24 DIAGNOSIS — Z992 Dependence on renal dialysis: Secondary | ICD-10-CM | POA: Diagnosis not present

## 2022-04-26 DIAGNOSIS — N2581 Secondary hyperparathyroidism of renal origin: Secondary | ICD-10-CM | POA: Diagnosis not present

## 2022-04-26 DIAGNOSIS — Z992 Dependence on renal dialysis: Secondary | ICD-10-CM | POA: Diagnosis not present

## 2022-04-26 DIAGNOSIS — D631 Anemia in chronic kidney disease: Secondary | ICD-10-CM | POA: Diagnosis not present

## 2022-04-26 DIAGNOSIS — N186 End stage renal disease: Secondary | ICD-10-CM | POA: Diagnosis not present

## 2022-04-29 DIAGNOSIS — N2581 Secondary hyperparathyroidism of renal origin: Secondary | ICD-10-CM | POA: Diagnosis not present

## 2022-04-29 DIAGNOSIS — Z992 Dependence on renal dialysis: Secondary | ICD-10-CM | POA: Diagnosis not present

## 2022-04-29 DIAGNOSIS — D631 Anemia in chronic kidney disease: Secondary | ICD-10-CM | POA: Diagnosis not present

## 2022-04-29 DIAGNOSIS — N186 End stage renal disease: Secondary | ICD-10-CM | POA: Diagnosis not present

## 2022-05-01 DIAGNOSIS — D631 Anemia in chronic kidney disease: Secondary | ICD-10-CM | POA: Diagnosis not present

## 2022-05-01 DIAGNOSIS — D62 Acute posthemorrhagic anemia: Secondary | ICD-10-CM | POA: Diagnosis not present

## 2022-05-01 DIAGNOSIS — S36892D Contusion of other intra-abdominal organs, subsequent encounter: Secondary | ICD-10-CM | POA: Diagnosis not present

## 2022-05-01 DIAGNOSIS — I4891 Unspecified atrial fibrillation: Secondary | ICD-10-CM | POA: Diagnosis not present

## 2022-05-01 DIAGNOSIS — S20211D Contusion of right front wall of thorax, subsequent encounter: Secondary | ICD-10-CM | POA: Diagnosis not present

## 2022-05-01 DIAGNOSIS — M792 Neuralgia and neuritis, unspecified: Secondary | ICD-10-CM | POA: Diagnosis not present

## 2022-05-01 DIAGNOSIS — N186 End stage renal disease: Secondary | ICD-10-CM | POA: Diagnosis not present

## 2022-05-01 DIAGNOSIS — N2581 Secondary hyperparathyroidism of renal origin: Secondary | ICD-10-CM | POA: Diagnosis not present

## 2022-05-01 DIAGNOSIS — Z992 Dependence on renal dialysis: Secondary | ICD-10-CM | POA: Diagnosis not present

## 2022-05-01 DIAGNOSIS — S36892S Contusion of other intra-abdominal organs, sequela: Secondary | ICD-10-CM | POA: Diagnosis not present

## 2022-05-02 DIAGNOSIS — E1122 Type 2 diabetes mellitus with diabetic chronic kidney disease: Secondary | ICD-10-CM | POA: Diagnosis not present

## 2022-05-02 DIAGNOSIS — Z992 Dependence on renal dialysis: Secondary | ICD-10-CM | POA: Diagnosis not present

## 2022-05-02 DIAGNOSIS — N186 End stage renal disease: Secondary | ICD-10-CM | POA: Diagnosis not present

## 2022-05-03 DIAGNOSIS — N186 End stage renal disease: Secondary | ICD-10-CM | POA: Diagnosis not present

## 2022-05-03 DIAGNOSIS — N2581 Secondary hyperparathyroidism of renal origin: Secondary | ICD-10-CM | POA: Diagnosis not present

## 2022-05-03 DIAGNOSIS — Z992 Dependence on renal dialysis: Secondary | ICD-10-CM | POA: Diagnosis not present

## 2022-05-03 DIAGNOSIS — D631 Anemia in chronic kidney disease: Secondary | ICD-10-CM | POA: Diagnosis not present

## 2022-05-06 DIAGNOSIS — Z992 Dependence on renal dialysis: Secondary | ICD-10-CM | POA: Diagnosis not present

## 2022-05-06 DIAGNOSIS — D631 Anemia in chronic kidney disease: Secondary | ICD-10-CM | POA: Diagnosis not present

## 2022-05-06 DIAGNOSIS — N2581 Secondary hyperparathyroidism of renal origin: Secondary | ICD-10-CM | POA: Diagnosis not present

## 2022-05-06 DIAGNOSIS — N186 End stage renal disease: Secondary | ICD-10-CM | POA: Diagnosis not present

## 2022-05-07 NOTE — Patient Outreach (Signed)
[place] [facility]) Care Management  05/07/2022  [place] 1956/10/20 [id number]   Received referral for Care Management from Insurance plan. Assigned patient to [name], RN Care Coordinator for follow up.   [place] Management Assistant [phone]

## 2022-05-08 DIAGNOSIS — D631 Anemia in chronic kidney disease: Secondary | ICD-10-CM | POA: Diagnosis not present

## 2022-05-08 DIAGNOSIS — N2581 Secondary hyperparathyroidism of renal origin: Secondary | ICD-10-CM | POA: Diagnosis not present

## 2022-05-08 DIAGNOSIS — Z992 Dependence on renal dialysis: Secondary | ICD-10-CM | POA: Diagnosis not present

## 2022-05-08 DIAGNOSIS — N186 End stage renal disease: Secondary | ICD-10-CM | POA: Diagnosis not present

## 2022-05-10 DIAGNOSIS — D631 Anemia in chronic kidney disease: Secondary | ICD-10-CM | POA: Diagnosis not present

## 2022-05-10 DIAGNOSIS — Z992 Dependence on renal dialysis: Secondary | ICD-10-CM | POA: Diagnosis not present

## 2022-05-10 DIAGNOSIS — N2581 Secondary hyperparathyroidism of renal origin: Secondary | ICD-10-CM | POA: Diagnosis not present

## 2022-05-10 DIAGNOSIS — N186 End stage renal disease: Secondary | ICD-10-CM | POA: Diagnosis not present

## 2022-05-13 ENCOUNTER — Other Ambulatory Visit: Payer: Self-pay | Admitting: *Deleted

## 2022-05-13 DIAGNOSIS — Z992 Dependence on renal dialysis: Secondary | ICD-10-CM | POA: Diagnosis not present

## 2022-05-13 DIAGNOSIS — N186 End stage renal disease: Secondary | ICD-10-CM | POA: Diagnosis not present

## 2022-05-13 DIAGNOSIS — D631 Anemia in chronic kidney disease: Secondary | ICD-10-CM | POA: Diagnosis not present

## 2022-05-13 DIAGNOSIS — N2581 Secondary hyperparathyroidism of renal origin: Secondary | ICD-10-CM | POA: Diagnosis not present

## 2022-05-13 NOTE — Patient Outreach (Signed)
[place] [facility]) Care Management  05/13/2022  Brenda Arnold Jul 06, 1956 [id number]   Referral Date: 6/6 Referral Source: Insurance Referral Reason: High Needs Referral Insurance: [employer] Reach   Outreach attempt #1, initially unsuccessful, however member returned call.  Identity verified.  This care manager introduced self and stated purpose of call.  [facility] care management services explained.  Declined services.  Benefits of program discussed, encouraged to consider.  Agrees to have information mailed to the home and follow up on decision.  Plan: RN CM will send outreach letter and follow up within the next 2 weeks regarding engagement.   [name], RN, MSN, [place] Manager [phone]

## 2022-05-15 DIAGNOSIS — Z992 Dependence on renal dialysis: Secondary | ICD-10-CM | POA: Diagnosis not present

## 2022-05-15 DIAGNOSIS — N2581 Secondary hyperparathyroidism of renal origin: Secondary | ICD-10-CM | POA: Diagnosis not present

## 2022-05-15 DIAGNOSIS — D631 Anemia in chronic kidney disease: Secondary | ICD-10-CM | POA: Diagnosis not present

## 2022-05-15 DIAGNOSIS — N186 End stage renal disease: Secondary | ICD-10-CM | POA: Diagnosis not present

## 2022-05-16 DIAGNOSIS — N186 End stage renal disease: Secondary | ICD-10-CM | POA: Diagnosis not present

## 2022-05-16 DIAGNOSIS — D6869 Other thrombophilia: Secondary | ICD-10-CM | POA: Diagnosis not present

## 2022-05-16 DIAGNOSIS — R531 Weakness: Secondary | ICD-10-CM | POA: Diagnosis not present

## 2022-05-16 DIAGNOSIS — R5381 Other malaise: Secondary | ICD-10-CM | POA: Diagnosis not present

## 2022-05-16 DIAGNOSIS — S0093XA Contusion of unspecified part of head, initial encounter: Secondary | ICD-10-CM | POA: Diagnosis not present

## 2022-05-16 DIAGNOSIS — Z992 Dependence on renal dialysis: Secondary | ICD-10-CM | POA: Diagnosis not present

## 2022-05-16 DIAGNOSIS — R55 Syncope and collapse: Secondary | ICD-10-CM | POA: Diagnosis not present

## 2022-05-17 DIAGNOSIS — D631 Anemia in chronic kidney disease: Secondary | ICD-10-CM | POA: Diagnosis not present

## 2022-05-17 DIAGNOSIS — Z992 Dependence on renal dialysis: Secondary | ICD-10-CM | POA: Diagnosis not present

## 2022-05-17 DIAGNOSIS — N186 End stage renal disease: Secondary | ICD-10-CM | POA: Diagnosis not present

## 2022-05-17 DIAGNOSIS — N2581 Secondary hyperparathyroidism of renal origin: Secondary | ICD-10-CM | POA: Diagnosis not present

## 2022-05-20 DIAGNOSIS — N186 End stage renal disease: Secondary | ICD-10-CM | POA: Diagnosis not present

## 2022-05-20 DIAGNOSIS — N2581 Secondary hyperparathyroidism of renal origin: Secondary | ICD-10-CM | POA: Diagnosis not present

## 2022-05-20 DIAGNOSIS — D631 Anemia in chronic kidney disease: Secondary | ICD-10-CM | POA: Diagnosis not present

## 2022-05-20 DIAGNOSIS — Z992 Dependence on renal dialysis: Secondary | ICD-10-CM | POA: Diagnosis not present

## 2022-05-22 DIAGNOSIS — N2581 Secondary hyperparathyroidism of renal origin: Secondary | ICD-10-CM | POA: Diagnosis not present

## 2022-05-22 DIAGNOSIS — D631 Anemia in chronic kidney disease: Secondary | ICD-10-CM | POA: Diagnosis not present

## 2022-05-22 DIAGNOSIS — N186 End stage renal disease: Secondary | ICD-10-CM | POA: Diagnosis not present

## 2022-05-22 DIAGNOSIS — Z992 Dependence on renal dialysis: Secondary | ICD-10-CM | POA: Diagnosis not present

## 2022-05-24 DIAGNOSIS — D631 Anemia in chronic kidney disease: Secondary | ICD-10-CM | POA: Diagnosis not present

## 2022-05-24 DIAGNOSIS — N186 End stage renal disease: Secondary | ICD-10-CM | POA: Diagnosis not present

## 2022-05-24 DIAGNOSIS — N2581 Secondary hyperparathyroidism of renal origin: Secondary | ICD-10-CM | POA: Diagnosis not present

## 2022-05-24 DIAGNOSIS — Z992 Dependence on renal dialysis: Secondary | ICD-10-CM | POA: Diagnosis not present

## 2022-05-27 ENCOUNTER — Encounter: Payer: Self-pay | Admitting: Podiatry

## 2022-05-27 ENCOUNTER — Ambulatory Visit (INDEPENDENT_AMBULATORY_CARE_PROVIDER_SITE_OTHER): Payer: Medicare Other | Admitting: Podiatry

## 2022-05-27 DIAGNOSIS — M79675 Pain in left toe(s): Secondary | ICD-10-CM | POA: Diagnosis not present

## 2022-05-27 DIAGNOSIS — L84 Corns and callosities: Secondary | ICD-10-CM | POA: Diagnosis not present

## 2022-05-27 DIAGNOSIS — N186 End stage renal disease: Secondary | ICD-10-CM | POA: Diagnosis not present

## 2022-05-27 DIAGNOSIS — E0822 Diabetes mellitus due to underlying condition with diabetic chronic kidney disease: Secondary | ICD-10-CM

## 2022-05-27 DIAGNOSIS — Z992 Dependence on renal dialysis: Secondary | ICD-10-CM

## 2022-05-27 DIAGNOSIS — N2581 Secondary hyperparathyroidism of renal origin: Secondary | ICD-10-CM | POA: Diagnosis not present

## 2022-05-27 DIAGNOSIS — M79674 Pain in right toe(s): Secondary | ICD-10-CM

## 2022-05-27 DIAGNOSIS — B351 Tinea unguium: Secondary | ICD-10-CM

## 2022-05-27 DIAGNOSIS — D631 Anemia in chronic kidney disease: Secondary | ICD-10-CM | POA: Diagnosis not present

## 2022-05-29 ENCOUNTER — Other Ambulatory Visit: Payer: Self-pay | Admitting: *Deleted

## 2022-05-29 DIAGNOSIS — N186 End stage renal disease: Secondary | ICD-10-CM | POA: Diagnosis not present

## 2022-05-29 DIAGNOSIS — Z992 Dependence on renal dialysis: Secondary | ICD-10-CM | POA: Diagnosis not present

## 2022-05-29 DIAGNOSIS — N2581 Secondary hyperparathyroidism of renal origin: Secondary | ICD-10-CM | POA: Diagnosis not present

## 2022-05-29 DIAGNOSIS — D631 Anemia in chronic kidney disease: Secondary | ICD-10-CM | POA: Diagnosis not present

## 2022-05-29 NOTE — Patient Outreach (Signed)
[place] [facility]) Care Management  05/29/2022  [place] [birth year] [id number]   Outgoing call placed to member to follow up on decision for [facility] engagement, no answer, voice message left.  Will close case at this time as member initially declined to participate.  Will open case if member calls back with interest.  [name], RN, MSN, [place] Manager [phone]

## 2022-05-31 DIAGNOSIS — N2581 Secondary hyperparathyroidism of renal origin: Secondary | ICD-10-CM | POA: Diagnosis not present

## 2022-05-31 DIAGNOSIS — D631 Anemia in chronic kidney disease: Secondary | ICD-10-CM | POA: Diagnosis not present

## 2022-05-31 DIAGNOSIS — N186 End stage renal disease: Secondary | ICD-10-CM | POA: Diagnosis not present

## 2022-05-31 DIAGNOSIS — Z992 Dependence on renal dialysis: Secondary | ICD-10-CM | POA: Diagnosis not present

## 2022-06-01 DIAGNOSIS — N186 End stage renal disease: Secondary | ICD-10-CM | POA: Diagnosis not present

## 2022-06-01 DIAGNOSIS — E1122 Type 2 diabetes mellitus with diabetic chronic kidney disease: Secondary | ICD-10-CM | POA: Diagnosis not present

## 2022-06-01 DIAGNOSIS — Z992 Dependence on renal dialysis: Secondary | ICD-10-CM | POA: Diagnosis not present

## 2022-06-03 DIAGNOSIS — D631 Anemia in chronic kidney disease: Secondary | ICD-10-CM | POA: Diagnosis not present

## 2022-06-03 DIAGNOSIS — N2581 Secondary hyperparathyroidism of renal origin: Secondary | ICD-10-CM | POA: Diagnosis not present

## 2022-06-03 DIAGNOSIS — Z992 Dependence on renal dialysis: Secondary | ICD-10-CM | POA: Diagnosis not present

## 2022-06-03 DIAGNOSIS — N186 End stage renal disease: Secondary | ICD-10-CM | POA: Diagnosis not present

## 2022-06-03 NOTE — Progress Notes (Signed)
  Subjective:  Patient ID: Brenda Arnold, female    DOB: 1956-03-25,  MRN: [id number]  Brenda Arnold presents to clinic today for at risk foot care with h/o NIDDM with ESRD on hemodialysis and callus(es) b/l lower extremities and painful thick toenails that are difficult to trim. Painful toenails interfere with ambulation. Aggravating factors include wearing enclosed shoe gear. Pain is relieved with periodic professional debridement. Painful calluses are aggravated when weightbearing with and without shoegear. Pain is relieved with periodic professional debridement.   Last known HgA1c was unknown.  Patient did not check blood glucose today.  New problem(s): None.   PCP is Kelton Pillar, MD , and last visit was April, 2023.  Allergies  Allergen Reactions   Other Shortness Of Breath and Anaphylaxis   Shellfish Allergy Shortness Of Breath    Other reaction(s): Breathing Problems   Shellfish-Derived Products Shortness Of Breath   Amlodipine Swelling and Other (See Comments)    Edema Other reaction(s): edema   Felodipine Other (See Comments)    Headache   Lisinopril Cough   Chlorhexidine Itching    CHG wipes    Review of Systems: Negative except as noted in the HPI.  Objective: No changes noted in today's physical examination.  Brenda Arnold is a pleasant 66 y.o. female in NAD. AAO X 3.  Vascular Examination: CFT <3 seconds b/l LE. Faintly palpable DP pulses b/l. Nonpalpable PT pulses b/l. Pedal hair absent b/l. Skin temperature gradient WNL b/l. No pain with calf compression b/l. No edema b/l LE. No cyanosis or clubbing noted b/l LE.  Dermatological Examination: Pedal integument with normal turgor, texture and tone b/l LE. No open wounds b/l. No interdigital macerations b/l. Toenails 1-5 b/l elongated, thickened, discolored with subungual debris. +Tenderness with dorsal palpation of nailplates. Hyperkeratotic lesion(s) noted submet head 5 b/l, plantarlateral midfoot  RLE, posterolateral heel RLE and distal aspect bilateral great toes. No erythema, no edema, no drainage, no fluctuance.  Musculoskeletal Examination: Muscle strength 5/5 to all lower extremity muscle groups bilaterally. No pain, crepitus or joint limitation noted with ROM bilateral LE. Hammertoe deformity noted 1-5 b/l. Pes planus deformity noted b/l lower extremities.  Neurological Examination: Pt has subjective symptoms of neuropathy. Protective sensation intact 5/5 intact bilaterally with 10g monofilament b/l.  Assessment/Plan: 1. Pain due to onychomycosis of toenails of both feet   2. Callus   3. Diabetes mellitus due to underlying condition with chronic kidney disease on chronic dialysis, without long-term current use of insulin ([place])      -Patient was evaluated and treated. All patient's and/or POA's questions/concerns answered on today's visit. -Patient to continue soft, supportive shoe gear daily. -Mycotic toenails 1-5 bilaterally were debrided in length and girth with sterile nail nippers and dremel without incident. -Callus(es) distal tip of bilateral great toes, submet head 5 b/l, plantarlateral aspect of midfoot right foot, and posterolateral heel right lower extremity pared utilizing sterile scalpel blade without complication or incident. Total number debrided =6. -Patient/POA to call should there be question/concern in the interim.   Return in about 3 months (around 08/27/2022).  Marzetta Board, DPM

## 2022-06-05 DIAGNOSIS — Z992 Dependence on renal dialysis: Secondary | ICD-10-CM | POA: Diagnosis not present

## 2022-06-05 DIAGNOSIS — D631 Anemia in chronic kidney disease: Secondary | ICD-10-CM | POA: Diagnosis not present

## 2022-06-05 DIAGNOSIS — N186 End stage renal disease: Secondary | ICD-10-CM | POA: Diagnosis not present

## 2022-06-05 DIAGNOSIS — N2581 Secondary hyperparathyroidism of renal origin: Secondary | ICD-10-CM | POA: Diagnosis not present

## 2022-06-07 DIAGNOSIS — D631 Anemia in chronic kidney disease: Secondary | ICD-10-CM | POA: Diagnosis not present

## 2022-06-07 DIAGNOSIS — N2581 Secondary hyperparathyroidism of renal origin: Secondary | ICD-10-CM | POA: Diagnosis not present

## 2022-06-07 DIAGNOSIS — N186 End stage renal disease: Secondary | ICD-10-CM | POA: Diagnosis not present

## 2022-06-07 DIAGNOSIS — Z992 Dependence on renal dialysis: Secondary | ICD-10-CM | POA: Diagnosis not present

## 2022-06-10 DIAGNOSIS — N2581 Secondary hyperparathyroidism of renal origin: Secondary | ICD-10-CM | POA: Diagnosis not present

## 2022-06-10 DIAGNOSIS — N186 End stage renal disease: Secondary | ICD-10-CM | POA: Diagnosis not present

## 2022-06-10 DIAGNOSIS — D631 Anemia in chronic kidney disease: Secondary | ICD-10-CM | POA: Diagnosis not present

## 2022-06-10 DIAGNOSIS — Z992 Dependence on renal dialysis: Secondary | ICD-10-CM | POA: Diagnosis not present

## 2022-06-12 DIAGNOSIS — E1122 Type 2 diabetes mellitus with diabetic chronic kidney disease: Secondary | ICD-10-CM | POA: Diagnosis not present

## 2022-06-12 DIAGNOSIS — Z992 Dependence on renal dialysis: Secondary | ICD-10-CM | POA: Diagnosis not present

## 2022-06-12 DIAGNOSIS — N2581 Secondary hyperparathyroidism of renal origin: Secondary | ICD-10-CM | POA: Diagnosis not present

## 2022-06-12 DIAGNOSIS — N186 End stage renal disease: Secondary | ICD-10-CM | POA: Diagnosis not present

## 2022-06-12 DIAGNOSIS — D631 Anemia in chronic kidney disease: Secondary | ICD-10-CM | POA: Diagnosis not present

## 2022-06-14 DIAGNOSIS — N186 End stage renal disease: Secondary | ICD-10-CM | POA: Diagnosis not present

## 2022-06-14 DIAGNOSIS — N2581 Secondary hyperparathyroidism of renal origin: Secondary | ICD-10-CM | POA: Diagnosis not present

## 2022-06-14 DIAGNOSIS — Z992 Dependence on renal dialysis: Secondary | ICD-10-CM | POA: Diagnosis not present

## 2022-06-14 DIAGNOSIS — D631 Anemia in chronic kidney disease: Secondary | ICD-10-CM | POA: Diagnosis not present

## 2022-06-17 DIAGNOSIS — D631 Anemia in chronic kidney disease: Secondary | ICD-10-CM | POA: Diagnosis not present

## 2022-06-17 DIAGNOSIS — N2581 Secondary hyperparathyroidism of renal origin: Secondary | ICD-10-CM | POA: Diagnosis not present

## 2022-06-17 DIAGNOSIS — N186 End stage renal disease: Secondary | ICD-10-CM | POA: Diagnosis not present

## 2022-06-17 DIAGNOSIS — Z992 Dependence on renal dialysis: Secondary | ICD-10-CM | POA: Diagnosis not present

## 2022-06-19 DIAGNOSIS — N186 End stage renal disease: Secondary | ICD-10-CM | POA: Diagnosis not present

## 2022-06-19 DIAGNOSIS — D631 Anemia in chronic kidney disease: Secondary | ICD-10-CM | POA: Diagnosis not present

## 2022-06-19 DIAGNOSIS — Z992 Dependence on renal dialysis: Secondary | ICD-10-CM | POA: Diagnosis not present

## 2022-06-19 DIAGNOSIS — N2581 Secondary hyperparathyroidism of renal origin: Secondary | ICD-10-CM | POA: Diagnosis not present

## 2022-06-21 DIAGNOSIS — N2581 Secondary hyperparathyroidism of renal origin: Secondary | ICD-10-CM | POA: Diagnosis not present

## 2022-06-21 DIAGNOSIS — N186 End stage renal disease: Secondary | ICD-10-CM | POA: Diagnosis not present

## 2022-06-21 DIAGNOSIS — D631 Anemia in chronic kidney disease: Secondary | ICD-10-CM | POA: Diagnosis not present

## 2022-06-21 DIAGNOSIS — Z992 Dependence on renal dialysis: Secondary | ICD-10-CM | POA: Diagnosis not present

## 2022-06-24 DIAGNOSIS — N2581 Secondary hyperparathyroidism of renal origin: Secondary | ICD-10-CM | POA: Diagnosis not present

## 2022-06-24 DIAGNOSIS — N186 End stage renal disease: Secondary | ICD-10-CM | POA: Diagnosis not present

## 2022-06-24 DIAGNOSIS — Z992 Dependence on renal dialysis: Secondary | ICD-10-CM | POA: Diagnosis not present

## 2022-06-24 DIAGNOSIS — D631 Anemia in chronic kidney disease: Secondary | ICD-10-CM | POA: Diagnosis not present

## 2022-06-26 DIAGNOSIS — N2581 Secondary hyperparathyroidism of renal origin: Secondary | ICD-10-CM | POA: Diagnosis not present

## 2022-06-26 DIAGNOSIS — Z992 Dependence on renal dialysis: Secondary | ICD-10-CM | POA: Diagnosis not present

## 2022-06-26 DIAGNOSIS — D631 Anemia in chronic kidney disease: Secondary | ICD-10-CM | POA: Diagnosis not present

## 2022-06-26 DIAGNOSIS — N186 End stage renal disease: Secondary | ICD-10-CM | POA: Diagnosis not present

## 2022-06-28 DIAGNOSIS — N2581 Secondary hyperparathyroidism of renal origin: Secondary | ICD-10-CM | POA: Diagnosis not present

## 2022-06-28 DIAGNOSIS — N186 End stage renal disease: Secondary | ICD-10-CM | POA: Diagnosis not present

## 2022-06-28 DIAGNOSIS — D631 Anemia in chronic kidney disease: Secondary | ICD-10-CM | POA: Diagnosis not present

## 2022-06-28 DIAGNOSIS — Z992 Dependence on renal dialysis: Secondary | ICD-10-CM | POA: Diagnosis not present

## 2022-07-01 DIAGNOSIS — Z992 Dependence on renal dialysis: Secondary | ICD-10-CM | POA: Diagnosis not present

## 2022-07-01 DIAGNOSIS — D631 Anemia in chronic kidney disease: Secondary | ICD-10-CM | POA: Diagnosis not present

## 2022-07-01 DIAGNOSIS — N186 End stage renal disease: Secondary | ICD-10-CM | POA: Diagnosis not present

## 2022-07-01 DIAGNOSIS — N2581 Secondary hyperparathyroidism of renal origin: Secondary | ICD-10-CM | POA: Diagnosis not present

## 2022-07-02 DIAGNOSIS — Z992 Dependence on renal dialysis: Secondary | ICD-10-CM | POA: Diagnosis not present

## 2022-07-02 DIAGNOSIS — N186 End stage renal disease: Secondary | ICD-10-CM | POA: Diagnosis not present

## 2022-07-02 DIAGNOSIS — E1122 Type 2 diabetes mellitus with diabetic chronic kidney disease: Secondary | ICD-10-CM | POA: Diagnosis not present

## 2022-07-03 DIAGNOSIS — N186 End stage renal disease: Secondary | ICD-10-CM | POA: Diagnosis not present

## 2022-07-03 DIAGNOSIS — Z992 Dependence on renal dialysis: Secondary | ICD-10-CM | POA: Diagnosis not present

## 2022-07-03 DIAGNOSIS — D631 Anemia in chronic kidney disease: Secondary | ICD-10-CM | POA: Diagnosis not present

## 2022-07-03 DIAGNOSIS — N2581 Secondary hyperparathyroidism of renal origin: Secondary | ICD-10-CM | POA: Diagnosis not present

## 2022-07-04 DIAGNOSIS — M6281 Muscle weakness (generalized): Secondary | ICD-10-CM | POA: Diagnosis not present

## 2022-07-05 DIAGNOSIS — Z992 Dependence on renal dialysis: Secondary | ICD-10-CM | POA: Diagnosis not present

## 2022-07-05 DIAGNOSIS — N186 End stage renal disease: Secondary | ICD-10-CM | POA: Diagnosis not present

## 2022-07-05 DIAGNOSIS — N2581 Secondary hyperparathyroidism of renal origin: Secondary | ICD-10-CM | POA: Diagnosis not present

## 2022-07-05 DIAGNOSIS — D631 Anemia in chronic kidney disease: Secondary | ICD-10-CM | POA: Diagnosis not present

## 2022-07-08 DIAGNOSIS — N186 End stage renal disease: Secondary | ICD-10-CM | POA: Diagnosis not present

## 2022-07-08 DIAGNOSIS — Z992 Dependence on renal dialysis: Secondary | ICD-10-CM | POA: Diagnosis not present

## 2022-07-08 DIAGNOSIS — N2581 Secondary hyperparathyroidism of renal origin: Secondary | ICD-10-CM | POA: Diagnosis not present

## 2022-07-08 DIAGNOSIS — D631 Anemia in chronic kidney disease: Secondary | ICD-10-CM | POA: Diagnosis not present

## 2022-07-10 DIAGNOSIS — D631 Anemia in chronic kidney disease: Secondary | ICD-10-CM | POA: Diagnosis not present

## 2022-07-10 DIAGNOSIS — N186 End stage renal disease: Secondary | ICD-10-CM | POA: Diagnosis not present

## 2022-07-10 DIAGNOSIS — Z992 Dependence on renal dialysis: Secondary | ICD-10-CM | POA: Diagnosis not present

## 2022-07-10 DIAGNOSIS — N2581 Secondary hyperparathyroidism of renal origin: Secondary | ICD-10-CM | POA: Diagnosis not present

## 2022-07-11 DIAGNOSIS — M6281 Muscle weakness (generalized): Secondary | ICD-10-CM | POA: Diagnosis not present

## 2022-07-12 DIAGNOSIS — D631 Anemia in chronic kidney disease: Secondary | ICD-10-CM | POA: Diagnosis not present

## 2022-07-12 DIAGNOSIS — N186 End stage renal disease: Secondary | ICD-10-CM | POA: Diagnosis not present

## 2022-07-12 DIAGNOSIS — Z992 Dependence on renal dialysis: Secondary | ICD-10-CM | POA: Diagnosis not present

## 2022-07-12 DIAGNOSIS — N2581 Secondary hyperparathyroidism of renal origin: Secondary | ICD-10-CM | POA: Diagnosis not present

## 2022-07-15 DIAGNOSIS — Z992 Dependence on renal dialysis: Secondary | ICD-10-CM | POA: Diagnosis not present

## 2022-07-15 DIAGNOSIS — D631 Anemia in chronic kidney disease: Secondary | ICD-10-CM | POA: Diagnosis not present

## 2022-07-15 DIAGNOSIS — N186 End stage renal disease: Secondary | ICD-10-CM | POA: Diagnosis not present

## 2022-07-15 DIAGNOSIS — N2581 Secondary hyperparathyroidism of renal origin: Secondary | ICD-10-CM | POA: Diagnosis not present

## 2022-07-16 DIAGNOSIS — M6281 Muscle weakness (generalized): Secondary | ICD-10-CM | POA: Diagnosis not present

## 2022-07-17 DIAGNOSIS — Z992 Dependence on renal dialysis: Secondary | ICD-10-CM | POA: Diagnosis not present

## 2022-07-17 DIAGNOSIS — N186 End stage renal disease: Secondary | ICD-10-CM | POA: Diagnosis not present

## 2022-07-17 DIAGNOSIS — N2581 Secondary hyperparathyroidism of renal origin: Secondary | ICD-10-CM | POA: Diagnosis not present

## 2022-07-17 DIAGNOSIS — D631 Anemia in chronic kidney disease: Secondary | ICD-10-CM | POA: Diagnosis not present

## 2022-07-18 DIAGNOSIS — M6281 Muscle weakness (generalized): Secondary | ICD-10-CM | POA: Diagnosis not present

## 2022-07-19 DIAGNOSIS — Z992 Dependence on renal dialysis: Secondary | ICD-10-CM | POA: Diagnosis not present

## 2022-07-19 DIAGNOSIS — D631 Anemia in chronic kidney disease: Secondary | ICD-10-CM | POA: Diagnosis not present

## 2022-07-19 DIAGNOSIS — N2581 Secondary hyperparathyroidism of renal origin: Secondary | ICD-10-CM | POA: Diagnosis not present

## 2022-07-19 DIAGNOSIS — N186 End stage renal disease: Secondary | ICD-10-CM | POA: Diagnosis not present

## 2022-07-22 DIAGNOSIS — N186 End stage renal disease: Secondary | ICD-10-CM | POA: Diagnosis not present

## 2022-07-22 DIAGNOSIS — D631 Anemia in chronic kidney disease: Secondary | ICD-10-CM | POA: Diagnosis not present

## 2022-07-22 DIAGNOSIS — N2581 Secondary hyperparathyroidism of renal origin: Secondary | ICD-10-CM | POA: Diagnosis not present

## 2022-07-22 DIAGNOSIS — Z992 Dependence on renal dialysis: Secondary | ICD-10-CM | POA: Diagnosis not present

## 2022-07-24 DIAGNOSIS — D631 Anemia in chronic kidney disease: Secondary | ICD-10-CM | POA: Diagnosis not present

## 2022-07-24 DIAGNOSIS — N2581 Secondary hyperparathyroidism of renal origin: Secondary | ICD-10-CM | POA: Diagnosis not present

## 2022-07-24 DIAGNOSIS — N186 End stage renal disease: Secondary | ICD-10-CM | POA: Diagnosis not present

## 2022-07-24 DIAGNOSIS — Z992 Dependence on renal dialysis: Secondary | ICD-10-CM | POA: Diagnosis not present

## 2022-07-25 DIAGNOSIS — M6281 Muscle weakness (generalized): Secondary | ICD-10-CM | POA: Diagnosis not present

## 2022-07-26 DIAGNOSIS — N186 End stage renal disease: Secondary | ICD-10-CM | POA: Diagnosis not present

## 2022-07-26 DIAGNOSIS — N2581 Secondary hyperparathyroidism of renal origin: Secondary | ICD-10-CM | POA: Diagnosis not present

## 2022-07-26 DIAGNOSIS — Z992 Dependence on renal dialysis: Secondary | ICD-10-CM | POA: Diagnosis not present

## 2022-07-26 DIAGNOSIS — D631 Anemia in chronic kidney disease: Secondary | ICD-10-CM | POA: Diagnosis not present

## 2022-07-29 DIAGNOSIS — D631 Anemia in chronic kidney disease: Secondary | ICD-10-CM | POA: Diagnosis not present

## 2022-07-29 DIAGNOSIS — Z992 Dependence on renal dialysis: Secondary | ICD-10-CM | POA: Diagnosis not present

## 2022-07-29 DIAGNOSIS — N2581 Secondary hyperparathyroidism of renal origin: Secondary | ICD-10-CM | POA: Diagnosis not present

## 2022-07-29 DIAGNOSIS — N186 End stage renal disease: Secondary | ICD-10-CM | POA: Diagnosis not present

## 2022-07-30 DIAGNOSIS — M6281 Muscle weakness (generalized): Secondary | ICD-10-CM | POA: Diagnosis not present

## 2022-07-31 DIAGNOSIS — Z992 Dependence on renal dialysis: Secondary | ICD-10-CM | POA: Diagnosis not present

## 2022-07-31 DIAGNOSIS — D631 Anemia in chronic kidney disease: Secondary | ICD-10-CM | POA: Diagnosis not present

## 2022-07-31 DIAGNOSIS — N2581 Secondary hyperparathyroidism of renal origin: Secondary | ICD-10-CM | POA: Diagnosis not present

## 2022-07-31 DIAGNOSIS — N186 End stage renal disease: Secondary | ICD-10-CM | POA: Diagnosis not present

## 2022-08-01 DIAGNOSIS — M6281 Muscle weakness (generalized): Secondary | ICD-10-CM | POA: Diagnosis not present

## 2022-08-02 DIAGNOSIS — D631 Anemia in chronic kidney disease: Secondary | ICD-10-CM | POA: Diagnosis not present

## 2022-08-02 DIAGNOSIS — N186 End stage renal disease: Secondary | ICD-10-CM | POA: Diagnosis not present

## 2022-08-02 DIAGNOSIS — Z992 Dependence on renal dialysis: Secondary | ICD-10-CM | POA: Diagnosis not present

## 2022-08-02 DIAGNOSIS — E1122 Type 2 diabetes mellitus with diabetic chronic kidney disease: Secondary | ICD-10-CM | POA: Diagnosis not present

## 2022-08-02 DIAGNOSIS — N2581 Secondary hyperparathyroidism of renal origin: Secondary | ICD-10-CM | POA: Diagnosis not present

## 2022-08-05 DIAGNOSIS — D631 Anemia in chronic kidney disease: Secondary | ICD-10-CM | POA: Diagnosis not present

## 2022-08-05 DIAGNOSIS — N186 End stage renal disease: Secondary | ICD-10-CM | POA: Diagnosis not present

## 2022-08-05 DIAGNOSIS — N2581 Secondary hyperparathyroidism of renal origin: Secondary | ICD-10-CM | POA: Diagnosis not present

## 2022-08-05 DIAGNOSIS — Z992 Dependence on renal dialysis: Secondary | ICD-10-CM | POA: Diagnosis not present

## 2022-08-07 DIAGNOSIS — Z992 Dependence on renal dialysis: Secondary | ICD-10-CM | POA: Diagnosis not present

## 2022-08-07 DIAGNOSIS — D631 Anemia in chronic kidney disease: Secondary | ICD-10-CM | POA: Diagnosis not present

## 2022-08-07 DIAGNOSIS — N186 End stage renal disease: Secondary | ICD-10-CM | POA: Diagnosis not present

## 2022-08-07 DIAGNOSIS — N2581 Secondary hyperparathyroidism of renal origin: Secondary | ICD-10-CM | POA: Diagnosis not present

## 2022-08-08 DIAGNOSIS — M6281 Muscle weakness (generalized): Secondary | ICD-10-CM | POA: Diagnosis not present

## 2022-08-09 DIAGNOSIS — N186 End stage renal disease: Secondary | ICD-10-CM | POA: Diagnosis not present

## 2022-08-09 DIAGNOSIS — Z992 Dependence on renal dialysis: Secondary | ICD-10-CM | POA: Diagnosis not present

## 2022-08-09 DIAGNOSIS — N2581 Secondary hyperparathyroidism of renal origin: Secondary | ICD-10-CM | POA: Diagnosis not present

## 2022-08-09 DIAGNOSIS — D631 Anemia in chronic kidney disease: Secondary | ICD-10-CM | POA: Diagnosis not present

## 2022-08-12 DIAGNOSIS — D631 Anemia in chronic kidney disease: Secondary | ICD-10-CM | POA: Diagnosis not present

## 2022-08-12 DIAGNOSIS — Z992 Dependence on renal dialysis: Secondary | ICD-10-CM | POA: Diagnosis not present

## 2022-08-12 DIAGNOSIS — N186 End stage renal disease: Secondary | ICD-10-CM | POA: Diagnosis not present

## 2022-08-12 DIAGNOSIS — N2581 Secondary hyperparathyroidism of renal origin: Secondary | ICD-10-CM | POA: Diagnosis not present

## 2022-08-13 DIAGNOSIS — M6281 Muscle weakness (generalized): Secondary | ICD-10-CM | POA: Diagnosis not present

## 2022-08-14 DIAGNOSIS — N186 End stage renal disease: Secondary | ICD-10-CM | POA: Diagnosis not present

## 2022-08-14 DIAGNOSIS — D631 Anemia in chronic kidney disease: Secondary | ICD-10-CM | POA: Diagnosis not present

## 2022-08-14 DIAGNOSIS — N2581 Secondary hyperparathyroidism of renal origin: Secondary | ICD-10-CM | POA: Diagnosis not present

## 2022-08-14 DIAGNOSIS — Z992 Dependence on renal dialysis: Secondary | ICD-10-CM | POA: Diagnosis not present

## 2022-08-15 DIAGNOSIS — M6281 Muscle weakness (generalized): Secondary | ICD-10-CM | POA: Diagnosis not present

## 2022-08-16 DIAGNOSIS — N2581 Secondary hyperparathyroidism of renal origin: Secondary | ICD-10-CM | POA: Diagnosis not present

## 2022-08-16 DIAGNOSIS — D631 Anemia in chronic kidney disease: Secondary | ICD-10-CM | POA: Diagnosis not present

## 2022-08-16 DIAGNOSIS — N186 End stage renal disease: Secondary | ICD-10-CM | POA: Diagnosis not present

## 2022-08-16 DIAGNOSIS — Z992 Dependence on renal dialysis: Secondary | ICD-10-CM | POA: Diagnosis not present

## 2022-08-19 DIAGNOSIS — D631 Anemia in chronic kidney disease: Secondary | ICD-10-CM | POA: Diagnosis not present

## 2022-08-19 DIAGNOSIS — Z992 Dependence on renal dialysis: Secondary | ICD-10-CM | POA: Diagnosis not present

## 2022-08-19 DIAGNOSIS — N2581 Secondary hyperparathyroidism of renal origin: Secondary | ICD-10-CM | POA: Diagnosis not present

## 2022-08-19 DIAGNOSIS — N186 End stage renal disease: Secondary | ICD-10-CM | POA: Diagnosis not present

## 2022-08-20 DIAGNOSIS — M6281 Muscle weakness (generalized): Secondary | ICD-10-CM | POA: Diagnosis not present

## 2022-08-21 DIAGNOSIS — N186 End stage renal disease: Secondary | ICD-10-CM | POA: Diagnosis not present

## 2022-08-21 DIAGNOSIS — D631 Anemia in chronic kidney disease: Secondary | ICD-10-CM | POA: Diagnosis not present

## 2022-08-21 DIAGNOSIS — Z992 Dependence on renal dialysis: Secondary | ICD-10-CM | POA: Diagnosis not present

## 2022-08-21 DIAGNOSIS — N2581 Secondary hyperparathyroidism of renal origin: Secondary | ICD-10-CM | POA: Diagnosis not present

## 2022-08-22 DIAGNOSIS — M6281 Muscle weakness (generalized): Secondary | ICD-10-CM | POA: Diagnosis not present

## 2022-08-23 DIAGNOSIS — Z992 Dependence on renal dialysis: Secondary | ICD-10-CM | POA: Diagnosis not present

## 2022-08-23 DIAGNOSIS — N2581 Secondary hyperparathyroidism of renal origin: Secondary | ICD-10-CM | POA: Diagnosis not present

## 2022-08-23 DIAGNOSIS — D631 Anemia in chronic kidney disease: Secondary | ICD-10-CM | POA: Diagnosis not present

## 2022-08-23 DIAGNOSIS — N186 End stage renal disease: Secondary | ICD-10-CM | POA: Diagnosis not present

## 2022-08-26 DIAGNOSIS — N186 End stage renal disease: Secondary | ICD-10-CM | POA: Diagnosis not present

## 2022-08-26 DIAGNOSIS — D631 Anemia in chronic kidney disease: Secondary | ICD-10-CM | POA: Diagnosis not present

## 2022-08-26 DIAGNOSIS — N2581 Secondary hyperparathyroidism of renal origin: Secondary | ICD-10-CM | POA: Diagnosis not present

## 2022-08-26 DIAGNOSIS — Z992 Dependence on renal dialysis: Secondary | ICD-10-CM | POA: Diagnosis not present

## 2022-08-27 DIAGNOSIS — M6281 Muscle weakness (generalized): Secondary | ICD-10-CM | POA: Diagnosis not present

## 2022-08-28 DIAGNOSIS — Z992 Dependence on renal dialysis: Secondary | ICD-10-CM | POA: Diagnosis not present

## 2022-08-28 DIAGNOSIS — N186 End stage renal disease: Secondary | ICD-10-CM | POA: Diagnosis not present

## 2022-08-28 DIAGNOSIS — D631 Anemia in chronic kidney disease: Secondary | ICD-10-CM | POA: Diagnosis not present

## 2022-08-28 DIAGNOSIS — N2581 Secondary hyperparathyroidism of renal origin: Secondary | ICD-10-CM | POA: Diagnosis not present

## 2022-08-29 DIAGNOSIS — M6281 Muscle weakness (generalized): Secondary | ICD-10-CM | POA: Diagnosis not present

## 2022-08-30 DIAGNOSIS — N2581 Secondary hyperparathyroidism of renal origin: Secondary | ICD-10-CM | POA: Diagnosis not present

## 2022-08-30 DIAGNOSIS — N186 End stage renal disease: Secondary | ICD-10-CM | POA: Diagnosis not present

## 2022-08-30 DIAGNOSIS — Z992 Dependence on renal dialysis: Secondary | ICD-10-CM | POA: Diagnosis not present

## 2022-08-30 DIAGNOSIS — D631 Anemia in chronic kidney disease: Secondary | ICD-10-CM | POA: Diagnosis not present

## 2022-09-01 DIAGNOSIS — Z992 Dependence on renal dialysis: Secondary | ICD-10-CM | POA: Diagnosis not present

## 2022-09-01 DIAGNOSIS — N186 End stage renal disease: Secondary | ICD-10-CM | POA: Diagnosis not present

## 2022-09-01 DIAGNOSIS — E1122 Type 2 diabetes mellitus with diabetic chronic kidney disease: Secondary | ICD-10-CM | POA: Diagnosis not present

## 2022-09-02 DIAGNOSIS — Z992 Dependence on renal dialysis: Secondary | ICD-10-CM | POA: Diagnosis not present

## 2022-09-02 DIAGNOSIS — N186 End stage renal disease: Secondary | ICD-10-CM | POA: Diagnosis not present

## 2022-09-02 DIAGNOSIS — Z23 Encounter for immunization: Secondary | ICD-10-CM | POA: Diagnosis not present

## 2022-09-02 DIAGNOSIS — N2581 Secondary hyperparathyroidism of renal origin: Secondary | ICD-10-CM | POA: Diagnosis not present

## 2022-09-02 DIAGNOSIS — D631 Anemia in chronic kidney disease: Secondary | ICD-10-CM | POA: Diagnosis not present

## 2022-09-03 ENCOUNTER — Ambulatory Visit (INDEPENDENT_AMBULATORY_CARE_PROVIDER_SITE_OTHER): Payer: Medicare Other | Admitting: Podiatry

## 2022-09-03 ENCOUNTER — Encounter: Payer: Self-pay | Admitting: Podiatry

## 2022-09-03 DIAGNOSIS — B351 Tinea unguium: Secondary | ICD-10-CM

## 2022-09-03 DIAGNOSIS — M79674 Pain in right toe(s): Secondary | ICD-10-CM | POA: Diagnosis not present

## 2022-09-03 DIAGNOSIS — M79675 Pain in left toe(s): Secondary | ICD-10-CM

## 2022-09-03 DIAGNOSIS — L84 Corns and callosities: Secondary | ICD-10-CM

## 2022-09-03 DIAGNOSIS — Z992 Dependence on renal dialysis: Secondary | ICD-10-CM | POA: Diagnosis not present

## 2022-09-03 DIAGNOSIS — E0822 Diabetes mellitus due to underlying condition with diabetic chronic kidney disease: Secondary | ICD-10-CM | POA: Diagnosis not present

## 2022-09-03 DIAGNOSIS — N186 End stage renal disease: Secondary | ICD-10-CM

## 2022-09-03 DIAGNOSIS — M6281 Muscle weakness (generalized): Secondary | ICD-10-CM | POA: Diagnosis not present

## 2022-09-04 DIAGNOSIS — Z23 Encounter for immunization: Secondary | ICD-10-CM | POA: Diagnosis not present

## 2022-09-04 DIAGNOSIS — N2581 Secondary hyperparathyroidism of renal origin: Secondary | ICD-10-CM | POA: Diagnosis not present

## 2022-09-04 DIAGNOSIS — Z992 Dependence on renal dialysis: Secondary | ICD-10-CM | POA: Diagnosis not present

## 2022-09-04 DIAGNOSIS — N186 End stage renal disease: Secondary | ICD-10-CM | POA: Diagnosis not present

## 2022-09-04 DIAGNOSIS — D631 Anemia in chronic kidney disease: Secondary | ICD-10-CM | POA: Diagnosis not present

## 2022-09-05 DIAGNOSIS — M6281 Muscle weakness (generalized): Secondary | ICD-10-CM | POA: Diagnosis not present

## 2022-09-06 DIAGNOSIS — N2581 Secondary hyperparathyroidism of renal origin: Secondary | ICD-10-CM | POA: Diagnosis not present

## 2022-09-06 DIAGNOSIS — Z992 Dependence on renal dialysis: Secondary | ICD-10-CM | POA: Diagnosis not present

## 2022-09-06 DIAGNOSIS — N186 End stage renal disease: Secondary | ICD-10-CM | POA: Diagnosis not present

## 2022-09-06 DIAGNOSIS — Z23 Encounter for immunization: Secondary | ICD-10-CM | POA: Diagnosis not present

## 2022-09-06 DIAGNOSIS — D631 Anemia in chronic kidney disease: Secondary | ICD-10-CM | POA: Diagnosis not present

## 2022-09-08 NOTE — Progress Notes (Signed)
  Subjective:  Patient ID: Brenda Arnold, female    DOB: October 04, 1956,  MRN: [id number]  Brenda Arnold presents to clinic today for:  Chief Complaint  Patient presents with   foot care    [place] problem(s): None.   PCP is Kelton Pillar, MD.  Allergies  Allergen Reactions   Other Shortness Of Breath and Anaphylaxis   Shellfish Allergy Shortness Of Breath    Other reaction(s): Breathing Problems   Shellfish-Derived Products Shortness Of Breath   Amlodipine Swelling and Other (See Comments)    Edema Other reaction(s): edema   Felodipine Other (See Comments)    Headache   Lisinopril Cough   Chlorhexidine Itching    CHG wipes   Review of Systems: Negative except as noted in the HPI.  Objective: No changes noted in today's physical examination.  Brenda Arnold is a pleasant 66 y.o. female obese in NAD. AAO x 3.  Vascular Examination: CFT <3 seconds b/l LE. Faintly palpable DP pulses b/l. Nonpalpable PT pulses b/l. Pedal hair absent b/l. Skin temperature gradient WNL b/l. No pain with calf compression b/l. No edema b/l LE. No cyanosis or clubbing noted b/l LE.  Dermatological Examination: Pedal integument with normal turgor, texture and tone b/l LE. No open wounds b/l. No interdigital macerations b/l. Toenails 1-5 b/l elongated, thickened, discolored with subungual debris. +Tenderness with dorsal palpation of nailplates.   Hyperkeratotic lesion(s) noted submet head 5 b/l, plantarlateral midfoot RLE, posterolateral heel RLE and distal aspect bilateral great toes. No erythema, no edema, no drainage, no fluctuance.  Musculoskeletal Examination: Muscle strength 5/5 to all lower extremity muscle groups bilaterally. No pain, crepitus or joint limitation noted with ROM bilateral LE. Hammertoe deformity noted 1-5 b/l. Pes planus deformity noted b/l lower extremities.  Neurological Examination: Pt has subjective symptoms of neuropathy. Protective sensation intact 5/5  intact bilaterally with 10g monofilament b/l.  Assessment/Plan: 1. Pain due to onychomycosis of toenails of both feet   2. Callus   3. Diabetes mellitus due to underlying condition with chronic kidney disease on chronic dialysis, without long-term current use of insulin (HCC)     No orders of the defined types were placed in this encounter.   -Consent given for treatment as described below: -Examined patient. -Continue foot and shoe inspections daily. Monitor blood glucose per PCP/Endocrinologist's recommendations. -Toenails 1-5 b/l were debrided in length and girth with sterile nail nippers and dremel without iatrogenic bleeding.  -Callus(es) posterolateral aspect of right heel, bilateral great toes, submet head 5 b/l, and plantarlateral aspect of midfoot right foot pared utilizing sterile scalpel blade without complication or incident. Total number debrided =6. -Patient/POA to call should there be question/concern in the interim.   Return in about 3 months (around 12/04/2022).  Marzetta Board, DPM

## 2022-09-09 DIAGNOSIS — Z992 Dependence on renal dialysis: Secondary | ICD-10-CM | POA: Diagnosis not present

## 2022-09-09 DIAGNOSIS — N186 End stage renal disease: Secondary | ICD-10-CM | POA: Diagnosis not present

## 2022-09-09 DIAGNOSIS — Z23 Encounter for immunization: Secondary | ICD-10-CM | POA: Diagnosis not present

## 2022-09-09 DIAGNOSIS — N2581 Secondary hyperparathyroidism of renal origin: Secondary | ICD-10-CM | POA: Diagnosis not present

## 2022-09-09 DIAGNOSIS — D631 Anemia in chronic kidney disease: Secondary | ICD-10-CM | POA: Diagnosis not present

## 2022-09-10 DIAGNOSIS — M6281 Muscle weakness (generalized): Secondary | ICD-10-CM | POA: Diagnosis not present

## 2022-09-11 DIAGNOSIS — Z23 Encounter for immunization: Secondary | ICD-10-CM | POA: Diagnosis not present

## 2022-09-11 DIAGNOSIS — E1122 Type 2 diabetes mellitus with diabetic chronic kidney disease: Secondary | ICD-10-CM | POA: Diagnosis not present

## 2022-09-11 DIAGNOSIS — Z992 Dependence on renal dialysis: Secondary | ICD-10-CM | POA: Diagnosis not present

## 2022-09-11 DIAGNOSIS — N186 End stage renal disease: Secondary | ICD-10-CM | POA: Diagnosis not present

## 2022-09-11 DIAGNOSIS — N2581 Secondary hyperparathyroidism of renal origin: Secondary | ICD-10-CM | POA: Diagnosis not present

## 2022-09-11 DIAGNOSIS — D631 Anemia in chronic kidney disease: Secondary | ICD-10-CM | POA: Diagnosis not present

## 2022-09-12 DIAGNOSIS — M6281 Muscle weakness (generalized): Secondary | ICD-10-CM | POA: Diagnosis not present

## 2022-09-13 DIAGNOSIS — D631 Anemia in chronic kidney disease: Secondary | ICD-10-CM | POA: Diagnosis not present

## 2022-09-13 DIAGNOSIS — N186 End stage renal disease: Secondary | ICD-10-CM | POA: Diagnosis not present

## 2022-09-13 DIAGNOSIS — Z992 Dependence on renal dialysis: Secondary | ICD-10-CM | POA: Diagnosis not present

## 2022-09-13 DIAGNOSIS — N2581 Secondary hyperparathyroidism of renal origin: Secondary | ICD-10-CM | POA: Diagnosis not present

## 2022-09-13 DIAGNOSIS — Z23 Encounter for immunization: Secondary | ICD-10-CM | POA: Diagnosis not present

## 2022-09-16 DIAGNOSIS — Z992 Dependence on renal dialysis: Secondary | ICD-10-CM | POA: Diagnosis not present

## 2022-09-16 DIAGNOSIS — N186 End stage renal disease: Secondary | ICD-10-CM | POA: Diagnosis not present

## 2022-09-16 DIAGNOSIS — Z23 Encounter for immunization: Secondary | ICD-10-CM | POA: Diagnosis not present

## 2022-09-16 DIAGNOSIS — D631 Anemia in chronic kidney disease: Secondary | ICD-10-CM | POA: Diagnosis not present

## 2022-09-16 DIAGNOSIS — N2581 Secondary hyperparathyroidism of renal origin: Secondary | ICD-10-CM | POA: Diagnosis not present

## 2022-09-17 ENCOUNTER — Other Ambulatory Visit: Payer: Self-pay | Admitting: Cardiovascular Disease

## 2022-09-17 DIAGNOSIS — M6281 Muscle weakness (generalized): Secondary | ICD-10-CM | POA: Diagnosis not present

## 2022-09-18 DIAGNOSIS — N186 End stage renal disease: Secondary | ICD-10-CM | POA: Diagnosis not present

## 2022-09-18 DIAGNOSIS — Z992 Dependence on renal dialysis: Secondary | ICD-10-CM | POA: Diagnosis not present

## 2022-09-18 DIAGNOSIS — D631 Anemia in chronic kidney disease: Secondary | ICD-10-CM | POA: Diagnosis not present

## 2022-09-18 DIAGNOSIS — N2581 Secondary hyperparathyroidism of renal origin: Secondary | ICD-10-CM | POA: Diagnosis not present

## 2022-09-18 DIAGNOSIS — Z23 Encounter for immunization: Secondary | ICD-10-CM | POA: Diagnosis not present

## 2022-09-19 DIAGNOSIS — D631 Anemia in chronic kidney disease: Secondary | ICD-10-CM | POA: Diagnosis not present

## 2022-09-19 DIAGNOSIS — I700 Atherosclerosis of aorta: Secondary | ICD-10-CM | POA: Diagnosis not present

## 2022-09-19 DIAGNOSIS — I129 Hypertensive chronic kidney disease with stage 1 through stage 4 chronic kidney disease, or unspecified chronic kidney disease: Secondary | ICD-10-CM | POA: Diagnosis not present

## 2022-09-19 DIAGNOSIS — I480 Paroxysmal atrial fibrillation: Secondary | ICD-10-CM | POA: Diagnosis not present

## 2022-09-19 DIAGNOSIS — N186 End stage renal disease: Secondary | ICD-10-CM | POA: Diagnosis not present

## 2022-09-19 DIAGNOSIS — Z0000 Encounter for general adult medical examination without abnormal findings: Secondary | ICD-10-CM | POA: Diagnosis not present

## 2022-09-19 DIAGNOSIS — Z23 Encounter for immunization: Secondary | ICD-10-CM | POA: Diagnosis not present

## 2022-09-19 DIAGNOSIS — M6281 Muscle weakness (generalized): Secondary | ICD-10-CM | POA: Diagnosis not present

## 2022-09-19 DIAGNOSIS — E1121 Type 2 diabetes mellitus with diabetic nephropathy: Secondary | ICD-10-CM | POA: Diagnosis not present

## 2022-09-19 DIAGNOSIS — E7800 Pure hypercholesterolemia, unspecified: Secondary | ICD-10-CM | POA: Diagnosis not present

## 2022-09-19 DIAGNOSIS — I82401 Acute embolism and thrombosis of unspecified deep veins of right lower extremity: Secondary | ICD-10-CM | POA: Diagnosis not present

## 2022-09-19 DIAGNOSIS — Z992 Dependence on renal dialysis: Secondary | ICD-10-CM | POA: Diagnosis not present

## 2022-09-20 ENCOUNTER — Other Ambulatory Visit: Payer: Self-pay | Admitting: Internal Medicine

## 2022-09-20 DIAGNOSIS — R2232 Localized swelling, mass and lump, left upper limb: Secondary | ICD-10-CM

## 2022-09-20 DIAGNOSIS — D631 Anemia in chronic kidney disease: Secondary | ICD-10-CM | POA: Diagnosis not present

## 2022-09-20 DIAGNOSIS — Z992 Dependence on renal dialysis: Secondary | ICD-10-CM | POA: Diagnosis not present

## 2022-09-20 DIAGNOSIS — Z23 Encounter for immunization: Secondary | ICD-10-CM | POA: Diagnosis not present

## 2022-09-20 DIAGNOSIS — N2581 Secondary hyperparathyroidism of renal origin: Secondary | ICD-10-CM | POA: Diagnosis not present

## 2022-09-20 DIAGNOSIS — N186 End stage renal disease: Secondary | ICD-10-CM | POA: Diagnosis not present

## 2022-09-23 ENCOUNTER — Other Ambulatory Visit: Payer: Self-pay | Admitting: Internal Medicine

## 2022-09-23 DIAGNOSIS — Z992 Dependence on renal dialysis: Secondary | ICD-10-CM | POA: Diagnosis not present

## 2022-09-23 DIAGNOSIS — N186 End stage renal disease: Secondary | ICD-10-CM | POA: Diagnosis not present

## 2022-09-23 DIAGNOSIS — D631 Anemia in chronic kidney disease: Secondary | ICD-10-CM | POA: Diagnosis not present

## 2022-09-23 DIAGNOSIS — E2839 Other primary ovarian failure: Secondary | ICD-10-CM

## 2022-09-23 DIAGNOSIS — N2581 Secondary hyperparathyroidism of renal origin: Secondary | ICD-10-CM | POA: Diagnosis not present

## 2022-09-23 DIAGNOSIS — Z23 Encounter for immunization: Secondary | ICD-10-CM | POA: Diagnosis not present

## 2022-09-24 DIAGNOSIS — M6281 Muscle weakness (generalized): Secondary | ICD-10-CM | POA: Diagnosis not present

## 2022-09-25 DIAGNOSIS — Z23 Encounter for immunization: Secondary | ICD-10-CM | POA: Diagnosis not present

## 2022-09-25 DIAGNOSIS — D631 Anemia in chronic kidney disease: Secondary | ICD-10-CM | POA: Diagnosis not present

## 2022-09-25 DIAGNOSIS — N2581 Secondary hyperparathyroidism of renal origin: Secondary | ICD-10-CM | POA: Diagnosis not present

## 2022-09-25 DIAGNOSIS — N186 End stage renal disease: Secondary | ICD-10-CM | POA: Diagnosis not present

## 2022-09-25 DIAGNOSIS — Z992 Dependence on renal dialysis: Secondary | ICD-10-CM | POA: Diagnosis not present

## 2022-09-26 DIAGNOSIS — M6281 Muscle weakness (generalized): Secondary | ICD-10-CM | POA: Diagnosis not present

## 2022-09-26 NOTE — Progress Notes (Signed)
Office Visit    Patient Name: Brenda Arnold Date of Encounter: 09/27/2022  Primary Care Provider:  Mckinley Jewel, MD Primary Cardiologist:  Fransico Him, MD Primary Electrophysiologist: None  Chief Complaint    Brenda Arnold is a 66 y.o. female with PMH of paroxysmal A-fib (on Eliquis), cardiac tamponade and 2028 s/p pericardial window, ESRD on HD, HLD, anemia, DVT, HTN, obesity who presents today for follow-up of coronary artery disease and atrial fibrillation.  Past Medical History    Past Medical History:  Diagnosis Date   [place] coronary artery calcium score greater than 400    Ca score 3824 on CT 08/2021   Anemia    Arthritis    Asthma    Complication of anesthesia    difficulty with getting oxygen saturation up- "patient was not aware"  Bottom of feet burning in PACU   Constipation    because of Iron   Diabetes mellitus    not on meds   DVT (deep venous thrombosis) ([place]) 2019   post hip replacement - right   Dyslipidemia    Epistaxis 11/05/2012   ESRD (end stage renal disease) on dialysis ([place])    "MWF; [facility]" (09/15/2018)- started 02/13/2017   History of blood transfusion    HTN (hypertension)    Hyperparathyroidism due to renal insufficiency (HCC)    Obesity    s/p panniculectomy   Paroxysmal A-fib ([place])    a. chronic coumadin;  b. 12/2009 Echo: EF 60-65%, Gr 1 DD.   PCOS (polycystic ovarian syndrome)    Pericarditis 08/2019   pericarditis with pericardial effusion, s/p right VATS/pericardial window 08/31/19   Pneumonia    Seasonal allergies    Sleep apnea    a. not using CPAP, last study  >8 yrs   Vitamin D deficiency    Past Surgical History:  Procedure Laterality Date   ABDOMINAL HYSTERECTOMY     with panniculctomy   AV FISTULA PLACEMENT  09/02/2012   Procedure: ARTERIOVENOUS (AV) FISTULA CREATION;  Surgeon: Elam Dutch, MD;  Location: [place];  Service: Vascular;  Laterality: Left;  Creation of Left Radial-Cephalic Fistula     BREAST SURGERY     Biopsy right breast   COLONOSCOPY     COLONOSCOPY W/ BIOPSIES AND POLYPECTOMY     DILATION AND CURETTAGE OF UTERUS     KNEE ARTHROSCOPY Right    PANNICULECTOMY     REVISON OF ARTERIOVENOUS FISTULA Left 04/27/2014   Procedure: REVISON OF LEFT RADIAL-CEPHALIC ARTERIOVENOUS FISTULA;  Surgeon: Mal Misty, MD;  Location: [place];  Service: Vascular;  Laterality: Left;   REVISON OF ARTERIOVENOUS FISTULA Left 01/13/2020   Procedure: REVISON OF ARTERIOVENOUS FISTULA;  Surgeon: Marty Heck, MD;  Location: [facility];  Service: Vascular;  Laterality: Left;   TOTAL HIP ARTHROPLASTY Right 09/15/2018   TOTAL HIP ARTHROPLASTY Right 09/15/2018   Procedure: RIGHT TOTAL HIP ARTHROPLASTY ANTERIOR APPROACH;  Surgeon: Leandrew Koyanagi, MD;  Location: [place];  Service: Orthopedics;  Laterality: Right;   TOTAL HIP ARTHROPLASTY Left 11/23/2020   Procedure: LEFT TOTAL HIP ARTHROPLASTY ANTERIOR APPROACH;  Surgeon: Leandrew Koyanagi, MD;  Location: [place];  Service: Orthopedics;  Laterality: Left;  NEEDS RNFA PLEASE   UVULOPLASTY     VIDEO ASSISTED THORACOSCOPY (VATS)/WEDGE RESECTION Right 08/31/2019   Procedure: VIDEO ASSISTED THORACOSCOPY/ DRAINAGE OF PERICARDIAL EFFUSION/ PERICARDIAL WINDOW/ ABORTED PERICARDIOCENTESIS ;  Surgeon: Lajuana Matte, MD;  Location: [place];  Service: Thoracic;  Laterality: Right;  Allergies  Allergies  Allergen Reactions   Other Shortness Of Breath and Anaphylaxis   Shellfish Allergy Shortness Of Breath    Other reaction(s): Breathing Problems   Shellfish-Derived Products Shortness Of Breath   Amlodipine Swelling and Other (See Comments)    Edema Other reaction(s): edema   Felodipine Other (See Comments)    Headache   Lisinopril Cough   Chlorhexidine Itching    CHG wipes    History of Present Illness    Brenda Arnold  is a 66 year old female with the above mention past medical history who presents today for follow-up of coronary artery disease  and atrial fibrillation.  She has extensive past medical history and was initially seen in 2011 for complaint of chest pain in the setting of atrial fibrillation.  She underwent a Myoview that showed inferior lateral ischemia.  She was treated with medical therapy and deferred left heart catheterization.  In 04/2017 she completed a second Myoview stress test for complaint of dyspnea on exertion.  That showed no reversible ischemia.  She presented to the ED in 2019 with complaint of dizziness and chest pain with shortness of breath.  She was found to have A-fib with RVR low blood pressure.  She was started on Cardizem drip and converted to sinus rhythm with improvement of blood pressure.  She was treated with Eliquis for anticoagulation.  She was seen in the ED following hypotension in the setting of dialysis with chest pain. Echocardiogram revealed moderate pericardial effusion without signs of tamponade.  She subsequently developed cardiogenic shock and was transferred to ICU on 9/29.  She was found to have cardiac tamponade, underwent pericardial window with drain placement on 08/2019.  She was seen in the ED on 03/2021 of lightheadedness and hypotension.  EKG was performed and patient was in AF with RVR and rate of 151.Given her hypotension and tachycardia, decision was made to cardiovert.  She underwent failed cardioversion x3 (at 50, 100, 150 J). She underwent defibrillation at 200 J, and subsequently converted to sinus rhythm.  She was seen by Dr. Radford Pax on 01/2020 and amiodarone was discontinued because she was maintaining sinus rhythm.  2D echo was completed showed normal LV function and no pericardial effusion.  She was hospitalized on 03/2021 with A-fib and RVR and underwent cardioversion and was placed back on amiodarone 200 mg twice daily. She was last seen by Dr. Radford Pax on 07/2021 for follow-up and was reportedly doing well.  She underwent coronary calcium scoring that resulted in score of 3824 with calcium  noted in the LAD, left circumflex, and RCA.  She underwent Lexiscan Myoview that revealed prior ischemia or infarct and was considered low risk.   Brenda Arnold presents today for annual follow-up alone.  Since last being seen in the office patient reports that she has been doing well from a cardiac perspective and denies any chest pain or dizziness.  She does endorse shortness of breath with ambulation and elevated heart rate that improves with rest.  Her blood pressures today were well controlled at 124/70 and heart rate was initially 102 bpm return to 82 bpm with rest.  She reports that she did note palpitations after missing 2 doses of amiodarone.  This resolved once she resumed medications and has not reoccurred.  She is currently in the process of being weaned off midodrine in order to be added to the kidney transplant list.  She is still taking midodrine especially on dialysis days due to hypotension following  session.  During our visit we discussed increasing physical activity and she is considering Silver sneakers at the [facility].  We also spent time discussing pathophysiology of heart disease and the importance of following a heart healthy diet..  Patient denies chest pain, palpitations, dyspnea, PND, orthopnea, nausea, vomiting, dizziness, syncope, edema, weight gain, or early satiety.  Home Medications    Current Outpatient Medications  Medication Sig Dispense Refill   acetaminophen (TYLENOL) 500 MG tablet Take 2 tablets (1,000 mg total) by mouth every 8 (eight) hours as needed for mild pain.     amiodarone (PACERONE) 200 MG tablet Take 1 tablet (200 mg total) by mouth daily. 90 tablet 3   B Complex-C-Folic Acid (DIALYVITE TABLET) TABS Take 1 tablet by mouth daily.     bisacodyl (DULCOLAX) 5 MG EC tablet Take 1 tablet (5 mg total) by mouth daily as needed for moderate constipation.     colchicine 0.6 MG tablet TAKE 0.5 TABLETS (0.3 MG TOTAL) BY MOUTH EVERY MONDAY, WEDNESDAY, AND FRIDAY. (Patient  taking differently: Take 0.3 mg by mouth See admin instructions. Take one tablet  by mouth on Mon, Wed and Fridays) 45 tablet 3   ELIQUIS 5 MG TABS tablet Take 1 tablet (5 mg total) by mouth 2 (two) times daily. 180 tablet 3   Etelcalcetide HCl (PARSABIV IV) Etelcalcetide (Parsabiv)     ethyl chloride spray Apply 1 application. topically See admin instructions. 1 application only on Mon, Wed, Fridays     fluticasone (FLONASE) 50 MCG/ACT nasal spray Place 2 sprays into both nostrils daily. 9.9 mL 0   gabapentin (NEURONTIN) 100 MG capsule Take 100 mg by mouth at bedtime.  3   loratadine (CLARITIN) 10 MG tablet Take 1 tablet (10 mg total) by mouth daily. 30 tablet 0   methocarbamol (ROBAXIN) 500 MG tablet Take 1 tablet (500 mg total) by mouth every 8 (eight) hours as needed for muscle spasms. 30 tablet 0   midodrine (PROAMATINE) 10 MG tablet Take 1 tablet (10 mg total) by mouth 3 (three) times daily with meals.     sucroferric oxyhydroxide (VELPHORO) 500 MG chewable tablet Chew 2 tablets (1,000 mg total) by mouth 3 (three) times daily with meals. 180 tablet 0   atorvastatin (LIPITOR) 40 MG tablet Take 1 tablet (40 mg total) by mouth daily. 90 tablet 3   No current facility-administered medications for this visit.     Review of Systems  Please see the history of present illness.    (+) Shortness of breath (+) Dizziness  All other systems reviewed and are otherwise negative except as noted above.  Physical Exam    Wt Readings from Last 3 Encounters:  09/27/22 239 lb (108.4 kg)  04/17/22 244 lb 4.3 oz (110.8 kg)  09/18/21 236 lb (107 kg)   VS: Vitals:   09/27/22 1102  BP: 124/78  Pulse: (!) 102  SpO2: 96%  ,Body mass index is 37.43 kg/m.  Constitutional:      Appearance: Healthy appearance. Not in distress.  Neck:     Vascular: JVD normal.  Pulmonary:     Effort: Pulmonary effort is normal.     Breath sounds: No wheezing. No rales. Diminished in the bases Cardiovascular:      Normal rate. Regular rhythm. Normal S1. Normal S2.      Murmurs: There is no murmur.  Edema:    Peripheral edema absent.  Abdominal:     Palpations: Abdomen is soft non tender. There is no hepatomegaly.  Skin:    General: Skin is warm and dry.  Neurological:     General: No focal deficit present.     Mental Status: Alert and oriented to person, place and time.     Cranial Nerves: Cranial nerves are intact.  EKG/LABS/Other Studies Reviewed    ECG personally reviewed by me today -sinus tach with rate of 102 bpm no acute changes noted QT interval of 318 ms with no acute changes compared to previous EKG.  Risk Assessment/Calculations:    CHA2DS2-VASc Score = 5   This indicates a 7.2% annual risk of stroke. The patient's score is based upon: CHF History: 1 HTN History: 1 Diabetes History: 0 Stroke History: 0 Vascular Disease History: 1 Age Score: 1 Gender Score: 1           Lab Results  Component Value Date   WBC 8.5 04/18/2022   HGB 9.3 (L) 04/18/2022   HCT 28.9 (L) 04/18/2022   MCV 97.0 04/18/2022   PLT 129 (L) 04/18/2022   Lab Results  Component Value Date   CREATININE 10.69 (H) 04/17/2022   BUN 43 (H) 04/17/2022   NA 136 04/17/2022   K 4.2 04/17/2022   CL 96 (L) 04/17/2022   CO2 28 04/17/2022   Lab Results  Component Value Date   ALT 22 03/18/2021   AST 23 03/18/2021   ALKPHOS 241 (H) 03/18/2021   BILITOT 0.5 03/18/2021   Lab Results  Component Value Date   CHOL 164 03/18/2021   HDL 46 03/18/2021   LDLCALC 103 (H) 03/18/2021   TRIG 75 03/18/2021   CHOLHDL 3.6 03/18/2021    Lab Results  Component Value Date   HGBA1C 5.2 03/18/2021    Assessment & Plan    1.  Paroxysmal atrial fibrillation: -Patient is currently rate controlled with amiodarone 200 mg daily.  Heart rate today was 82 bpm following rest. -She was advised not to miss any doses of amiodarone in order to maintain controlled rate. -She is currently anticoagulated with Eliquis 5 mg  twice daily -She reports no bleeding issues with Eliquis. -CHA2DS2-VASc Score = 5 [CHF History: 1, HTN History: 1, Diabetes History: 0, Stroke History: 0, Vascular Disease History: 1, Age Score: 1, Gender Score: 1].  Therefore, the patient's annual risk of stroke is 7.2 %.      2.  Nonobstructive CAD: -She underwent coronary calcium scoring that resulted in score of 3824 with calcium noted in the LAD, left circumflex, and RCA.  Lexiscan Myoview was completed to rule out ischemia that was normal -Today patient reports no chest pain or shortness of breath -Patient encouraged to continue GDMT with atorvastatin 40 mg not on ASA due to Eliquis for atrial fibrillation.  3.  ESRD on HD: -Currently Monday Wednesday and Friday dialysis -She is in the process of weaning off midodrine in order to be added to kidney transplant list.  4.  History of cardiac tamponade: -s/p pericardial window performed 2020 -No residual pericardial effusion on 2D echo in 2022  5.  Hyperlipidemia: -Patient's last LDL was 103 -Continue Lipitor 40 mg daily -Continue low-sodium heart healthy diet  Disposition: Follow-up with Fransico Him, MD or APP in 12 months    Medication Adjustments/Labs and Tests Ordered: Current medicines are reviewed at length with the patient today.  Concerns regarding medicines are outlined above.   Signed, Mable Fill, Marissa Nestle, NP 09/27/2022, 12:04 PM [facility]  Note:  This document was prepared using Dragon voice  recognition software and may include unintentional dictation errors.

## 2022-09-27 ENCOUNTER — Encounter: Payer: Self-pay | Admitting: Nurse Practitioner

## 2022-09-27 ENCOUNTER — Ambulatory Visit: Payer: Medicare Other | Attending: Nurse Practitioner | Admitting: Nurse Practitioner

## 2022-09-27 VITALS — BP 124/78 | HR 102 | Ht 67.0 in | Wt 239.0 lb

## 2022-09-27 DIAGNOSIS — I2510 Atherosclerotic heart disease of native coronary artery without angina pectoris: Secondary | ICD-10-CM | POA: Diagnosis not present

## 2022-09-27 DIAGNOSIS — N2581 Secondary hyperparathyroidism of renal origin: Secondary | ICD-10-CM | POA: Diagnosis not present

## 2022-09-27 DIAGNOSIS — I314 Cardiac tamponade: Secondary | ICD-10-CM | POA: Diagnosis not present

## 2022-09-27 DIAGNOSIS — I480 Paroxysmal atrial fibrillation: Secondary | ICD-10-CM | POA: Diagnosis not present

## 2022-09-27 DIAGNOSIS — D631 Anemia in chronic kidney disease: Secondary | ICD-10-CM | POA: Diagnosis not present

## 2022-09-27 DIAGNOSIS — N186 End stage renal disease: Secondary | ICD-10-CM | POA: Diagnosis not present

## 2022-09-27 DIAGNOSIS — Z23 Encounter for immunization: Secondary | ICD-10-CM | POA: Diagnosis not present

## 2022-09-27 DIAGNOSIS — Z992 Dependence on renal dialysis: Secondary | ICD-10-CM | POA: Insufficient documentation

## 2022-09-27 DIAGNOSIS — E785 Hyperlipidemia, unspecified: Secondary | ICD-10-CM | POA: Diagnosis not present

## 2022-09-27 MED ORDER — ATORVASTATIN CALCIUM 40 MG PO TABS: 40.0000 mg | ORAL_TABLET | Freq: Every day | ORAL | 3 refills | Status: DC

## 2022-09-27 NOTE — Patient Instructions (Signed)
Medication Instructions:  Your physician recommends that you continue on your current medications as directed. Please refer to the Current Medication list given to you today.  *If you need a refill on your cardiac medications before your next appointment, please call your pharmacy*  Follow-Up: At [facility], you and your health needs are our priority.  As part of our continuing mission to provide you with exceptional heart care, we have created designated Provider Care Teams.  These Care Teams include your primary Cardiologist (physician) and Advanced Practice Providers (APPs -  Physician Assistants and Nurse Practitioners) who all work together to provide you with the care you need, when you need it.  We recommend signing up for the patient portal called "MyChart".  Sign up information is provided on this After Visit Summary.  MyChart is used to connect with patients for Virtual Visits (Telemedicine).  Patients are able to view lab/test results, encounter notes, upcoming appointments, etc.  Non-urgent messages can be sent to your provider as well.   To learn more about what you can do with MyChart, go to [email or web address].    Your next appointment:   1 year(s)  The format for your next appointment:   In Person  Provider:   Ambrose Pancoast, NP        Other Instructions

## 2022-09-30 DIAGNOSIS — D631 Anemia in chronic kidney disease: Secondary | ICD-10-CM | POA: Diagnosis not present

## 2022-09-30 DIAGNOSIS — N2581 Secondary hyperparathyroidism of renal origin: Secondary | ICD-10-CM | POA: Diagnosis not present

## 2022-09-30 DIAGNOSIS — Z992 Dependence on renal dialysis: Secondary | ICD-10-CM | POA: Diagnosis not present

## 2022-09-30 DIAGNOSIS — Z23 Encounter for immunization: Secondary | ICD-10-CM | POA: Diagnosis not present

## 2022-09-30 DIAGNOSIS — N186 End stage renal disease: Secondary | ICD-10-CM | POA: Diagnosis not present

## 2022-10-01 DIAGNOSIS — M6281 Muscle weakness (generalized): Secondary | ICD-10-CM | POA: Diagnosis not present

## 2022-10-02 DIAGNOSIS — D631 Anemia in chronic kidney disease: Secondary | ICD-10-CM | POA: Diagnosis not present

## 2022-10-02 DIAGNOSIS — E1122 Type 2 diabetes mellitus with diabetic chronic kidney disease: Secondary | ICD-10-CM | POA: Diagnosis not present

## 2022-10-02 DIAGNOSIS — Z992 Dependence on renal dialysis: Secondary | ICD-10-CM | POA: Diagnosis not present

## 2022-10-02 DIAGNOSIS — N2581 Secondary hyperparathyroidism of renal origin: Secondary | ICD-10-CM | POA: Diagnosis not present

## 2022-10-02 DIAGNOSIS — N186 End stage renal disease: Secondary | ICD-10-CM | POA: Diagnosis not present

## 2022-10-03 DIAGNOSIS — M6281 Muscle weakness (generalized): Secondary | ICD-10-CM | POA: Diagnosis not present

## 2022-10-04 DIAGNOSIS — Z992 Dependence on renal dialysis: Secondary | ICD-10-CM | POA: Diagnosis not present

## 2022-10-04 DIAGNOSIS — N186 End stage renal disease: Secondary | ICD-10-CM | POA: Diagnosis not present

## 2022-10-04 DIAGNOSIS — D631 Anemia in chronic kidney disease: Secondary | ICD-10-CM | POA: Diagnosis not present

## 2022-10-04 DIAGNOSIS — N2581 Secondary hyperparathyroidism of renal origin: Secondary | ICD-10-CM | POA: Diagnosis not present

## 2022-10-07 DIAGNOSIS — Z992 Dependence on renal dialysis: Secondary | ICD-10-CM | POA: Diagnosis not present

## 2022-10-07 DIAGNOSIS — D631 Anemia in chronic kidney disease: Secondary | ICD-10-CM | POA: Diagnosis not present

## 2022-10-07 DIAGNOSIS — N186 End stage renal disease: Secondary | ICD-10-CM | POA: Diagnosis not present

## 2022-10-07 DIAGNOSIS — N2581 Secondary hyperparathyroidism of renal origin: Secondary | ICD-10-CM | POA: Diagnosis not present

## 2022-10-08 DIAGNOSIS — M6281 Muscle weakness (generalized): Secondary | ICD-10-CM | POA: Diagnosis not present

## 2022-10-09 DIAGNOSIS — N186 End stage renal disease: Secondary | ICD-10-CM | POA: Diagnosis not present

## 2022-10-09 DIAGNOSIS — D631 Anemia in chronic kidney disease: Secondary | ICD-10-CM | POA: Diagnosis not present

## 2022-10-09 DIAGNOSIS — Z992 Dependence on renal dialysis: Secondary | ICD-10-CM | POA: Diagnosis not present

## 2022-10-09 DIAGNOSIS — N2581 Secondary hyperparathyroidism of renal origin: Secondary | ICD-10-CM | POA: Diagnosis not present

## 2022-10-10 DIAGNOSIS — M6281 Muscle weakness (generalized): Secondary | ICD-10-CM | POA: Diagnosis not present

## 2022-10-11 DIAGNOSIS — N186 End stage renal disease: Secondary | ICD-10-CM | POA: Diagnosis not present

## 2022-10-11 DIAGNOSIS — D631 Anemia in chronic kidney disease: Secondary | ICD-10-CM | POA: Diagnosis not present

## 2022-10-11 DIAGNOSIS — N2581 Secondary hyperparathyroidism of renal origin: Secondary | ICD-10-CM | POA: Diagnosis not present

## 2022-10-11 DIAGNOSIS — Z992 Dependence on renal dialysis: Secondary | ICD-10-CM | POA: Diagnosis not present

## 2022-10-14 DIAGNOSIS — N186 End stage renal disease: Secondary | ICD-10-CM | POA: Diagnosis not present

## 2022-10-14 DIAGNOSIS — Z992 Dependence on renal dialysis: Secondary | ICD-10-CM | POA: Diagnosis not present

## 2022-10-14 DIAGNOSIS — D631 Anemia in chronic kidney disease: Secondary | ICD-10-CM | POA: Diagnosis not present

## 2022-10-14 DIAGNOSIS — N2581 Secondary hyperparathyroidism of renal origin: Secondary | ICD-10-CM | POA: Diagnosis not present

## 2022-10-15 DIAGNOSIS — M6281 Muscle weakness (generalized): Secondary | ICD-10-CM | POA: Diagnosis not present

## 2022-10-16 DIAGNOSIS — N186 End stage renal disease: Secondary | ICD-10-CM | POA: Diagnosis not present

## 2022-10-16 DIAGNOSIS — N2581 Secondary hyperparathyroidism of renal origin: Secondary | ICD-10-CM | POA: Diagnosis not present

## 2022-10-16 DIAGNOSIS — Z992 Dependence on renal dialysis: Secondary | ICD-10-CM | POA: Diagnosis not present

## 2022-10-16 DIAGNOSIS — D631 Anemia in chronic kidney disease: Secondary | ICD-10-CM | POA: Diagnosis not present

## 2022-10-17 DIAGNOSIS — M6281 Muscle weakness (generalized): Secondary | ICD-10-CM | POA: Diagnosis not present

## 2022-10-18 DIAGNOSIS — N186 End stage renal disease: Secondary | ICD-10-CM | POA: Diagnosis not present

## 2022-10-18 DIAGNOSIS — N2581 Secondary hyperparathyroidism of renal origin: Secondary | ICD-10-CM | POA: Diagnosis not present

## 2022-10-18 DIAGNOSIS — Z992 Dependence on renal dialysis: Secondary | ICD-10-CM | POA: Diagnosis not present

## 2022-10-18 DIAGNOSIS — D631 Anemia in chronic kidney disease: Secondary | ICD-10-CM | POA: Diagnosis not present

## 2022-10-20 DIAGNOSIS — N2581 Secondary hyperparathyroidism of renal origin: Secondary | ICD-10-CM | POA: Diagnosis not present

## 2022-10-20 DIAGNOSIS — Z992 Dependence on renal dialysis: Secondary | ICD-10-CM | POA: Diagnosis not present

## 2022-10-20 DIAGNOSIS — N186 End stage renal disease: Secondary | ICD-10-CM | POA: Diagnosis not present

## 2022-10-20 DIAGNOSIS — D631 Anemia in chronic kidney disease: Secondary | ICD-10-CM | POA: Diagnosis not present

## 2022-10-22 DIAGNOSIS — D631 Anemia in chronic kidney disease: Secondary | ICD-10-CM | POA: Diagnosis not present

## 2022-10-22 DIAGNOSIS — N186 End stage renal disease: Secondary | ICD-10-CM | POA: Diagnosis not present

## 2022-10-22 DIAGNOSIS — Z992 Dependence on renal dialysis: Secondary | ICD-10-CM | POA: Diagnosis not present

## 2022-10-22 DIAGNOSIS — N2581 Secondary hyperparathyroidism of renal origin: Secondary | ICD-10-CM | POA: Diagnosis not present

## 2022-10-25 DIAGNOSIS — Z992 Dependence on renal dialysis: Secondary | ICD-10-CM | POA: Diagnosis not present

## 2022-10-25 DIAGNOSIS — N2581 Secondary hyperparathyroidism of renal origin: Secondary | ICD-10-CM | POA: Diagnosis not present

## 2022-10-25 DIAGNOSIS — D631 Anemia in chronic kidney disease: Secondary | ICD-10-CM | POA: Diagnosis not present

## 2022-10-25 DIAGNOSIS — N186 End stage renal disease: Secondary | ICD-10-CM | POA: Diagnosis not present

## 2022-10-28 DIAGNOSIS — D631 Anemia in chronic kidney disease: Secondary | ICD-10-CM | POA: Diagnosis not present

## 2022-10-28 DIAGNOSIS — Z992 Dependence on renal dialysis: Secondary | ICD-10-CM | POA: Diagnosis not present

## 2022-10-28 DIAGNOSIS — N186 End stage renal disease: Secondary | ICD-10-CM | POA: Diagnosis not present

## 2022-10-28 DIAGNOSIS — N2581 Secondary hyperparathyroidism of renal origin: Secondary | ICD-10-CM | POA: Diagnosis not present

## 2022-10-30 DIAGNOSIS — Z992 Dependence on renal dialysis: Secondary | ICD-10-CM | POA: Diagnosis not present

## 2022-10-30 DIAGNOSIS — D631 Anemia in chronic kidney disease: Secondary | ICD-10-CM | POA: Diagnosis not present

## 2022-10-30 DIAGNOSIS — N2581 Secondary hyperparathyroidism of renal origin: Secondary | ICD-10-CM | POA: Diagnosis not present

## 2022-10-30 DIAGNOSIS — N186 End stage renal disease: Secondary | ICD-10-CM | POA: Diagnosis not present

## 2022-11-01 DIAGNOSIS — D631 Anemia in chronic kidney disease: Secondary | ICD-10-CM | POA: Diagnosis not present

## 2022-11-01 DIAGNOSIS — N2581 Secondary hyperparathyroidism of renal origin: Secondary | ICD-10-CM | POA: Diagnosis not present

## 2022-11-01 DIAGNOSIS — Z992 Dependence on renal dialysis: Secondary | ICD-10-CM | POA: Diagnosis not present

## 2022-11-01 DIAGNOSIS — N186 End stage renal disease: Secondary | ICD-10-CM | POA: Diagnosis not present

## 2022-11-01 DIAGNOSIS — E1122 Type 2 diabetes mellitus with diabetic chronic kidney disease: Secondary | ICD-10-CM | POA: Diagnosis not present

## 2022-11-04 DIAGNOSIS — N2581 Secondary hyperparathyroidism of renal origin: Secondary | ICD-10-CM | POA: Diagnosis not present

## 2022-11-04 DIAGNOSIS — N186 End stage renal disease: Secondary | ICD-10-CM | POA: Diagnosis not present

## 2022-11-04 DIAGNOSIS — Z992 Dependence on renal dialysis: Secondary | ICD-10-CM | POA: Diagnosis not present

## 2022-11-04 DIAGNOSIS — D631 Anemia in chronic kidney disease: Secondary | ICD-10-CM | POA: Diagnosis not present

## 2022-11-06 DIAGNOSIS — D631 Anemia in chronic kidney disease: Secondary | ICD-10-CM | POA: Diagnosis not present

## 2022-11-06 DIAGNOSIS — N186 End stage renal disease: Secondary | ICD-10-CM | POA: Diagnosis not present

## 2022-11-06 DIAGNOSIS — Z992 Dependence on renal dialysis: Secondary | ICD-10-CM | POA: Diagnosis not present

## 2022-11-06 DIAGNOSIS — N2581 Secondary hyperparathyroidism of renal origin: Secondary | ICD-10-CM | POA: Diagnosis not present

## 2022-11-08 DIAGNOSIS — N2581 Secondary hyperparathyroidism of renal origin: Secondary | ICD-10-CM | POA: Diagnosis not present

## 2022-11-08 DIAGNOSIS — D631 Anemia in chronic kidney disease: Secondary | ICD-10-CM | POA: Diagnosis not present

## 2022-11-08 DIAGNOSIS — N186 End stage renal disease: Secondary | ICD-10-CM | POA: Diagnosis not present

## 2022-11-08 DIAGNOSIS — Z992 Dependence on renal dialysis: Secondary | ICD-10-CM | POA: Diagnosis not present

## 2022-11-11 DIAGNOSIS — N186 End stage renal disease: Secondary | ICD-10-CM | POA: Diagnosis not present

## 2022-11-11 DIAGNOSIS — Z992 Dependence on renal dialysis: Secondary | ICD-10-CM | POA: Diagnosis not present

## 2022-11-11 DIAGNOSIS — D631 Anemia in chronic kidney disease: Secondary | ICD-10-CM | POA: Diagnosis not present

## 2022-11-11 DIAGNOSIS — N2581 Secondary hyperparathyroidism of renal origin: Secondary | ICD-10-CM | POA: Diagnosis not present

## 2022-11-13 DIAGNOSIS — N2581 Secondary hyperparathyroidism of renal origin: Secondary | ICD-10-CM | POA: Diagnosis not present

## 2022-11-13 DIAGNOSIS — N186 End stage renal disease: Secondary | ICD-10-CM | POA: Diagnosis not present

## 2022-11-13 DIAGNOSIS — D631 Anemia in chronic kidney disease: Secondary | ICD-10-CM | POA: Diagnosis not present

## 2022-11-13 DIAGNOSIS — Z992 Dependence on renal dialysis: Secondary | ICD-10-CM | POA: Diagnosis not present

## 2022-11-15 DIAGNOSIS — Z992 Dependence on renal dialysis: Secondary | ICD-10-CM | POA: Diagnosis not present

## 2022-11-15 DIAGNOSIS — D631 Anemia in chronic kidney disease: Secondary | ICD-10-CM | POA: Diagnosis not present

## 2022-11-15 DIAGNOSIS — N186 End stage renal disease: Secondary | ICD-10-CM | POA: Diagnosis not present

## 2022-11-15 DIAGNOSIS — N2581 Secondary hyperparathyroidism of renal origin: Secondary | ICD-10-CM | POA: Diagnosis not present

## 2022-11-18 DIAGNOSIS — N2581 Secondary hyperparathyroidism of renal origin: Secondary | ICD-10-CM | POA: Diagnosis not present

## 2022-11-18 DIAGNOSIS — N186 End stage renal disease: Secondary | ICD-10-CM | POA: Diagnosis not present

## 2022-11-18 DIAGNOSIS — Z992 Dependence on renal dialysis: Secondary | ICD-10-CM | POA: Diagnosis not present

## 2022-11-18 DIAGNOSIS — D631 Anemia in chronic kidney disease: Secondary | ICD-10-CM | POA: Diagnosis not present

## 2022-11-20 ENCOUNTER — Other Ambulatory Visit: Payer: Self-pay | Admitting: Cardiology

## 2022-11-20 DIAGNOSIS — N2581 Secondary hyperparathyroidism of renal origin: Secondary | ICD-10-CM | POA: Diagnosis not present

## 2022-11-20 DIAGNOSIS — D631 Anemia in chronic kidney disease: Secondary | ICD-10-CM | POA: Diagnosis not present

## 2022-11-20 DIAGNOSIS — Z992 Dependence on renal dialysis: Secondary | ICD-10-CM | POA: Diagnosis not present

## 2022-11-20 DIAGNOSIS — N186 End stage renal disease: Secondary | ICD-10-CM | POA: Diagnosis not present

## 2022-11-22 DIAGNOSIS — Z992 Dependence on renal dialysis: Secondary | ICD-10-CM | POA: Diagnosis not present

## 2022-11-22 DIAGNOSIS — N2581 Secondary hyperparathyroidism of renal origin: Secondary | ICD-10-CM | POA: Diagnosis not present

## 2022-11-22 DIAGNOSIS — N186 End stage renal disease: Secondary | ICD-10-CM | POA: Diagnosis not present

## 2022-11-22 DIAGNOSIS — D631 Anemia in chronic kidney disease: Secondary | ICD-10-CM | POA: Diagnosis not present

## 2022-11-24 ENCOUNTER — Other Ambulatory Visit: Payer: Self-pay | Admitting: Cardiology

## 2022-11-24 DIAGNOSIS — Z992 Dependence on renal dialysis: Secondary | ICD-10-CM | POA: Diagnosis not present

## 2022-11-24 DIAGNOSIS — N2581 Secondary hyperparathyroidism of renal origin: Secondary | ICD-10-CM | POA: Diagnosis not present

## 2022-11-24 DIAGNOSIS — D631 Anemia in chronic kidney disease: Secondary | ICD-10-CM | POA: Diagnosis not present

## 2022-11-24 DIAGNOSIS — N186 End stage renal disease: Secondary | ICD-10-CM | POA: Diagnosis not present

## 2022-11-27 DIAGNOSIS — N186 End stage renal disease: Secondary | ICD-10-CM | POA: Diagnosis not present

## 2022-11-27 DIAGNOSIS — Z992 Dependence on renal dialysis: Secondary | ICD-10-CM | POA: Diagnosis not present

## 2022-11-27 DIAGNOSIS — D631 Anemia in chronic kidney disease: Secondary | ICD-10-CM | POA: Diagnosis not present

## 2022-11-27 DIAGNOSIS — N2581 Secondary hyperparathyroidism of renal origin: Secondary | ICD-10-CM | POA: Diagnosis not present

## 2022-11-29 DIAGNOSIS — N2581 Secondary hyperparathyroidism of renal origin: Secondary | ICD-10-CM | POA: Diagnosis not present

## 2022-11-29 DIAGNOSIS — Z992 Dependence on renal dialysis: Secondary | ICD-10-CM | POA: Diagnosis not present

## 2022-11-29 DIAGNOSIS — D631 Anemia in chronic kidney disease: Secondary | ICD-10-CM | POA: Diagnosis not present

## 2022-11-29 DIAGNOSIS — N186 End stage renal disease: Secondary | ICD-10-CM | POA: Diagnosis not present

## 2022-12-01 DIAGNOSIS — N186 End stage renal disease: Secondary | ICD-10-CM | POA: Diagnosis not present

## 2022-12-01 DIAGNOSIS — Z992 Dependence on renal dialysis: Secondary | ICD-10-CM | POA: Diagnosis not present

## 2022-12-01 DIAGNOSIS — N2581 Secondary hyperparathyroidism of renal origin: Secondary | ICD-10-CM | POA: Diagnosis not present

## 2022-12-01 DIAGNOSIS — D631 Anemia in chronic kidney disease: Secondary | ICD-10-CM | POA: Diagnosis not present

## 2022-12-02 DIAGNOSIS — Z992 Dependence on renal dialysis: Secondary | ICD-10-CM | POA: Diagnosis not present

## 2022-12-02 DIAGNOSIS — E1122 Type 2 diabetes mellitus with diabetic chronic kidney disease: Secondary | ICD-10-CM | POA: Diagnosis not present

## 2022-12-02 DIAGNOSIS — N186 End stage renal disease: Secondary | ICD-10-CM | POA: Diagnosis not present

## 2022-12-04 DIAGNOSIS — N186 End stage renal disease: Secondary | ICD-10-CM | POA: Diagnosis not present

## 2022-12-04 DIAGNOSIS — D631 Anemia in chronic kidney disease: Secondary | ICD-10-CM | POA: Diagnosis not present

## 2022-12-04 DIAGNOSIS — N2581 Secondary hyperparathyroidism of renal origin: Secondary | ICD-10-CM | POA: Diagnosis not present

## 2022-12-04 DIAGNOSIS — Z992 Dependence on renal dialysis: Secondary | ICD-10-CM | POA: Diagnosis not present

## 2022-12-06 DIAGNOSIS — N186 End stage renal disease: Secondary | ICD-10-CM | POA: Diagnosis not present

## 2022-12-06 DIAGNOSIS — N2581 Secondary hyperparathyroidism of renal origin: Secondary | ICD-10-CM | POA: Diagnosis not present

## 2022-12-06 DIAGNOSIS — Z992 Dependence on renal dialysis: Secondary | ICD-10-CM | POA: Diagnosis not present

## 2022-12-06 DIAGNOSIS — D631 Anemia in chronic kidney disease: Secondary | ICD-10-CM | POA: Diagnosis not present

## 2022-12-09 DIAGNOSIS — N2581 Secondary hyperparathyroidism of renal origin: Secondary | ICD-10-CM | POA: Diagnosis not present

## 2022-12-09 DIAGNOSIS — Z992 Dependence on renal dialysis: Secondary | ICD-10-CM | POA: Diagnosis not present

## 2022-12-09 DIAGNOSIS — D631 Anemia in chronic kidney disease: Secondary | ICD-10-CM | POA: Diagnosis not present

## 2022-12-09 DIAGNOSIS — N186 End stage renal disease: Secondary | ICD-10-CM | POA: Diagnosis not present

## 2022-12-11 DIAGNOSIS — N2581 Secondary hyperparathyroidism of renal origin: Secondary | ICD-10-CM | POA: Diagnosis not present

## 2022-12-11 DIAGNOSIS — Z992 Dependence on renal dialysis: Secondary | ICD-10-CM | POA: Diagnosis not present

## 2022-12-11 DIAGNOSIS — E1122 Type 2 diabetes mellitus with diabetic chronic kidney disease: Secondary | ICD-10-CM | POA: Diagnosis not present

## 2022-12-11 DIAGNOSIS — D631 Anemia in chronic kidney disease: Secondary | ICD-10-CM | POA: Diagnosis not present

## 2022-12-11 DIAGNOSIS — N186 End stage renal disease: Secondary | ICD-10-CM | POA: Diagnosis not present

## 2022-12-13 DIAGNOSIS — Z992 Dependence on renal dialysis: Secondary | ICD-10-CM | POA: Diagnosis not present

## 2022-12-13 DIAGNOSIS — N2581 Secondary hyperparathyroidism of renal origin: Secondary | ICD-10-CM | POA: Diagnosis not present

## 2022-12-13 DIAGNOSIS — D631 Anemia in chronic kidney disease: Secondary | ICD-10-CM | POA: Diagnosis not present

## 2022-12-13 DIAGNOSIS — N186 End stage renal disease: Secondary | ICD-10-CM | POA: Diagnosis not present

## 2022-12-16 DIAGNOSIS — Z992 Dependence on renal dialysis: Secondary | ICD-10-CM | POA: Diagnosis not present

## 2022-12-16 DIAGNOSIS — D631 Anemia in chronic kidney disease: Secondary | ICD-10-CM | POA: Diagnosis not present

## 2022-12-16 DIAGNOSIS — N186 End stage renal disease: Secondary | ICD-10-CM | POA: Diagnosis not present

## 2022-12-16 DIAGNOSIS — N2581 Secondary hyperparathyroidism of renal origin: Secondary | ICD-10-CM | POA: Diagnosis not present

## 2022-12-18 DIAGNOSIS — Z992 Dependence on renal dialysis: Secondary | ICD-10-CM | POA: Diagnosis not present

## 2022-12-18 DIAGNOSIS — N186 End stage renal disease: Secondary | ICD-10-CM | POA: Diagnosis not present

## 2022-12-18 DIAGNOSIS — D631 Anemia in chronic kidney disease: Secondary | ICD-10-CM | POA: Diagnosis not present

## 2022-12-18 DIAGNOSIS — N2581 Secondary hyperparathyroidism of renal origin: Secondary | ICD-10-CM | POA: Diagnosis not present

## 2022-12-20 DIAGNOSIS — D631 Anemia in chronic kidney disease: Secondary | ICD-10-CM | POA: Diagnosis not present

## 2022-12-20 DIAGNOSIS — N2581 Secondary hyperparathyroidism of renal origin: Secondary | ICD-10-CM | POA: Diagnosis not present

## 2022-12-20 DIAGNOSIS — N186 End stage renal disease: Secondary | ICD-10-CM | POA: Diagnosis not present

## 2022-12-20 DIAGNOSIS — Z992 Dependence on renal dialysis: Secondary | ICD-10-CM | POA: Diagnosis not present

## 2022-12-23 DIAGNOSIS — N2581 Secondary hyperparathyroidism of renal origin: Secondary | ICD-10-CM | POA: Diagnosis not present

## 2022-12-23 DIAGNOSIS — N186 End stage renal disease: Secondary | ICD-10-CM | POA: Diagnosis not present

## 2022-12-23 DIAGNOSIS — D631 Anemia in chronic kidney disease: Secondary | ICD-10-CM | POA: Diagnosis not present

## 2022-12-23 DIAGNOSIS — Z992 Dependence on renal dialysis: Secondary | ICD-10-CM | POA: Diagnosis not present

## 2022-12-25 ENCOUNTER — Ambulatory Visit (INDEPENDENT_AMBULATORY_CARE_PROVIDER_SITE_OTHER): Payer: Medicare Other | Admitting: Podiatry

## 2022-12-25 DIAGNOSIS — N2581 Secondary hyperparathyroidism of renal origin: Secondary | ICD-10-CM | POA: Diagnosis not present

## 2022-12-25 DIAGNOSIS — N186 End stage renal disease: Secondary | ICD-10-CM | POA: Diagnosis not present

## 2022-12-25 DIAGNOSIS — Z91199 Patient's noncompliance with other medical treatment and regimen due to unspecified reason: Secondary | ICD-10-CM

## 2022-12-25 DIAGNOSIS — D631 Anemia in chronic kidney disease: Secondary | ICD-10-CM | POA: Diagnosis not present

## 2022-12-25 DIAGNOSIS — Z992 Dependence on renal dialysis: Secondary | ICD-10-CM | POA: Diagnosis not present

## 2022-12-25 NOTE — Progress Notes (Signed)
1. No-show for appointment

## 2022-12-27 DIAGNOSIS — N2581 Secondary hyperparathyroidism of renal origin: Secondary | ICD-10-CM | POA: Diagnosis not present

## 2022-12-27 DIAGNOSIS — Z992 Dependence on renal dialysis: Secondary | ICD-10-CM | POA: Diagnosis not present

## 2022-12-27 DIAGNOSIS — D631 Anemia in chronic kidney disease: Secondary | ICD-10-CM | POA: Diagnosis not present

## 2022-12-27 DIAGNOSIS — N186 End stage renal disease: Secondary | ICD-10-CM | POA: Diagnosis not present

## 2022-12-30 DIAGNOSIS — N186 End stage renal disease: Secondary | ICD-10-CM | POA: Diagnosis not present

## 2022-12-30 DIAGNOSIS — N2581 Secondary hyperparathyroidism of renal origin: Secondary | ICD-10-CM | POA: Diagnosis not present

## 2022-12-30 DIAGNOSIS — Z992 Dependence on renal dialysis: Secondary | ICD-10-CM | POA: Diagnosis not present

## 2022-12-30 DIAGNOSIS — D631 Anemia in chronic kidney disease: Secondary | ICD-10-CM | POA: Diagnosis not present

## 2023-01-01 DIAGNOSIS — N2581 Secondary hyperparathyroidism of renal origin: Secondary | ICD-10-CM | POA: Diagnosis not present

## 2023-01-01 DIAGNOSIS — D631 Anemia in chronic kidney disease: Secondary | ICD-10-CM | POA: Diagnosis not present

## 2023-01-01 DIAGNOSIS — Z992 Dependence on renal dialysis: Secondary | ICD-10-CM | POA: Diagnosis not present

## 2023-01-01 DIAGNOSIS — N186 End stage renal disease: Secondary | ICD-10-CM | POA: Diagnosis not present

## 2023-01-02 DIAGNOSIS — N186 End stage renal disease: Secondary | ICD-10-CM | POA: Diagnosis not present

## 2023-01-02 DIAGNOSIS — Z992 Dependence on renal dialysis: Secondary | ICD-10-CM | POA: Diagnosis not present

## 2023-01-02 DIAGNOSIS — E1122 Type 2 diabetes mellitus with diabetic chronic kidney disease: Secondary | ICD-10-CM | POA: Diagnosis not present

## 2023-01-03 DIAGNOSIS — Z992 Dependence on renal dialysis: Secondary | ICD-10-CM | POA: Diagnosis not present

## 2023-01-03 DIAGNOSIS — N2581 Secondary hyperparathyroidism of renal origin: Secondary | ICD-10-CM | POA: Diagnosis not present

## 2023-01-03 DIAGNOSIS — D631 Anemia in chronic kidney disease: Secondary | ICD-10-CM | POA: Diagnosis not present

## 2023-01-03 DIAGNOSIS — N186 End stage renal disease: Secondary | ICD-10-CM | POA: Diagnosis not present

## 2023-01-06 DIAGNOSIS — D631 Anemia in chronic kidney disease: Secondary | ICD-10-CM | POA: Diagnosis not present

## 2023-01-06 DIAGNOSIS — N2581 Secondary hyperparathyroidism of renal origin: Secondary | ICD-10-CM | POA: Diagnosis not present

## 2023-01-06 DIAGNOSIS — N186 End stage renal disease: Secondary | ICD-10-CM | POA: Diagnosis not present

## 2023-01-06 DIAGNOSIS — Z992 Dependence on renal dialysis: Secondary | ICD-10-CM | POA: Diagnosis not present

## 2023-01-08 DIAGNOSIS — D631 Anemia in chronic kidney disease: Secondary | ICD-10-CM | POA: Diagnosis not present

## 2023-01-08 DIAGNOSIS — Z992 Dependence on renal dialysis: Secondary | ICD-10-CM | POA: Diagnosis not present

## 2023-01-08 DIAGNOSIS — N2581 Secondary hyperparathyroidism of renal origin: Secondary | ICD-10-CM | POA: Diagnosis not present

## 2023-01-08 DIAGNOSIS — N186 End stage renal disease: Secondary | ICD-10-CM | POA: Diagnosis not present

## 2023-01-09 ENCOUNTER — Encounter (HOSPITAL_COMMUNITY): Payer: Self-pay | Admitting: *Deleted

## 2023-01-10 DIAGNOSIS — D631 Anemia in chronic kidney disease: Secondary | ICD-10-CM | POA: Diagnosis not present

## 2023-01-10 DIAGNOSIS — N186 End stage renal disease: Secondary | ICD-10-CM | POA: Diagnosis not present

## 2023-01-10 DIAGNOSIS — Z992 Dependence on renal dialysis: Secondary | ICD-10-CM | POA: Diagnosis not present

## 2023-01-10 DIAGNOSIS — N2581 Secondary hyperparathyroidism of renal origin: Secondary | ICD-10-CM | POA: Diagnosis not present

## 2023-01-13 DIAGNOSIS — D631 Anemia in chronic kidney disease: Secondary | ICD-10-CM | POA: Diagnosis not present

## 2023-01-13 DIAGNOSIS — Z992 Dependence on renal dialysis: Secondary | ICD-10-CM | POA: Diagnosis not present

## 2023-01-13 DIAGNOSIS — N2581 Secondary hyperparathyroidism of renal origin: Secondary | ICD-10-CM | POA: Diagnosis not present

## 2023-01-13 DIAGNOSIS — N186 End stage renal disease: Secondary | ICD-10-CM | POA: Diagnosis not present

## 2023-01-15 DIAGNOSIS — N2581 Secondary hyperparathyroidism of renal origin: Secondary | ICD-10-CM | POA: Diagnosis not present

## 2023-01-15 DIAGNOSIS — D631 Anemia in chronic kidney disease: Secondary | ICD-10-CM | POA: Diagnosis not present

## 2023-01-15 DIAGNOSIS — N186 End stage renal disease: Secondary | ICD-10-CM | POA: Diagnosis not present

## 2023-01-15 DIAGNOSIS — Z992 Dependence on renal dialysis: Secondary | ICD-10-CM | POA: Diagnosis not present

## 2023-01-17 DIAGNOSIS — N2581 Secondary hyperparathyroidism of renal origin: Secondary | ICD-10-CM | POA: Diagnosis not present

## 2023-01-17 DIAGNOSIS — N186 End stage renal disease: Secondary | ICD-10-CM | POA: Diagnosis not present

## 2023-01-17 DIAGNOSIS — D631 Anemia in chronic kidney disease: Secondary | ICD-10-CM | POA: Diagnosis not present

## 2023-01-17 DIAGNOSIS — Z992 Dependence on renal dialysis: Secondary | ICD-10-CM | POA: Diagnosis not present

## 2023-01-20 DIAGNOSIS — D631 Anemia in chronic kidney disease: Secondary | ICD-10-CM | POA: Diagnosis not present

## 2023-01-20 DIAGNOSIS — N186 End stage renal disease: Secondary | ICD-10-CM | POA: Diagnosis not present

## 2023-01-20 DIAGNOSIS — N2581 Secondary hyperparathyroidism of renal origin: Secondary | ICD-10-CM | POA: Diagnosis not present

## 2023-01-20 DIAGNOSIS — Z992 Dependence on renal dialysis: Secondary | ICD-10-CM | POA: Diagnosis not present

## 2023-01-22 DIAGNOSIS — N2581 Secondary hyperparathyroidism of renal origin: Secondary | ICD-10-CM | POA: Diagnosis not present

## 2023-01-22 DIAGNOSIS — N186 End stage renal disease: Secondary | ICD-10-CM | POA: Diagnosis not present

## 2023-01-22 DIAGNOSIS — Z992 Dependence on renal dialysis: Secondary | ICD-10-CM | POA: Diagnosis not present

## 2023-01-22 DIAGNOSIS — D631 Anemia in chronic kidney disease: Secondary | ICD-10-CM | POA: Diagnosis not present

## 2023-01-24 ENCOUNTER — Other Ambulatory Visit: Payer: Self-pay | Admitting: Internal Medicine

## 2023-01-24 DIAGNOSIS — N2581 Secondary hyperparathyroidism of renal origin: Secondary | ICD-10-CM | POA: Diagnosis not present

## 2023-01-24 DIAGNOSIS — Z1231 Encounter for screening mammogram for malignant neoplasm of breast: Secondary | ICD-10-CM

## 2023-01-24 DIAGNOSIS — Z992 Dependence on renal dialysis: Secondary | ICD-10-CM | POA: Diagnosis not present

## 2023-01-24 DIAGNOSIS — N186 End stage renal disease: Secondary | ICD-10-CM | POA: Diagnosis not present

## 2023-01-24 DIAGNOSIS — D631 Anemia in chronic kidney disease: Secondary | ICD-10-CM | POA: Diagnosis not present

## 2023-01-27 DIAGNOSIS — D631 Anemia in chronic kidney disease: Secondary | ICD-10-CM | POA: Diagnosis not present

## 2023-01-27 DIAGNOSIS — Z992 Dependence on renal dialysis: Secondary | ICD-10-CM | POA: Diagnosis not present

## 2023-01-27 DIAGNOSIS — N2581 Secondary hyperparathyroidism of renal origin: Secondary | ICD-10-CM | POA: Diagnosis not present

## 2023-01-27 DIAGNOSIS — N186 End stage renal disease: Secondary | ICD-10-CM | POA: Diagnosis not present

## 2023-01-29 DIAGNOSIS — N2581 Secondary hyperparathyroidism of renal origin: Secondary | ICD-10-CM | POA: Diagnosis not present

## 2023-01-29 DIAGNOSIS — N186 End stage renal disease: Secondary | ICD-10-CM | POA: Diagnosis not present

## 2023-01-29 DIAGNOSIS — Z992 Dependence on renal dialysis: Secondary | ICD-10-CM | POA: Diagnosis not present

## 2023-01-29 DIAGNOSIS — D631 Anemia in chronic kidney disease: Secondary | ICD-10-CM | POA: Diagnosis not present

## 2023-01-31 DIAGNOSIS — Z992 Dependence on renal dialysis: Secondary | ICD-10-CM | POA: Diagnosis not present

## 2023-01-31 DIAGNOSIS — N2581 Secondary hyperparathyroidism of renal origin: Secondary | ICD-10-CM | POA: Diagnosis not present

## 2023-01-31 DIAGNOSIS — N186 End stage renal disease: Secondary | ICD-10-CM | POA: Diagnosis not present

## 2023-01-31 DIAGNOSIS — D631 Anemia in chronic kidney disease: Secondary | ICD-10-CM | POA: Diagnosis not present

## 2023-01-31 DIAGNOSIS — E1122 Type 2 diabetes mellitus with diabetic chronic kidney disease: Secondary | ICD-10-CM | POA: Diagnosis not present

## 2023-02-03 DIAGNOSIS — Z992 Dependence on renal dialysis: Secondary | ICD-10-CM | POA: Diagnosis not present

## 2023-02-03 DIAGNOSIS — N2581 Secondary hyperparathyroidism of renal origin: Secondary | ICD-10-CM | POA: Diagnosis not present

## 2023-02-03 DIAGNOSIS — N186 End stage renal disease: Secondary | ICD-10-CM | POA: Diagnosis not present

## 2023-02-03 DIAGNOSIS — D631 Anemia in chronic kidney disease: Secondary | ICD-10-CM | POA: Diagnosis not present

## 2023-02-03 DIAGNOSIS — E1122 Type 2 diabetes mellitus with diabetic chronic kidney disease: Secondary | ICD-10-CM | POA: Diagnosis not present

## 2023-02-05 DIAGNOSIS — Z992 Dependence on renal dialysis: Secondary | ICD-10-CM | POA: Diagnosis not present

## 2023-02-05 DIAGNOSIS — N186 End stage renal disease: Secondary | ICD-10-CM | POA: Diagnosis not present

## 2023-02-05 DIAGNOSIS — E1122 Type 2 diabetes mellitus with diabetic chronic kidney disease: Secondary | ICD-10-CM | POA: Diagnosis not present

## 2023-02-05 DIAGNOSIS — D631 Anemia in chronic kidney disease: Secondary | ICD-10-CM | POA: Diagnosis not present

## 2023-02-05 DIAGNOSIS — N2581 Secondary hyperparathyroidism of renal origin: Secondary | ICD-10-CM | POA: Diagnosis not present

## 2023-02-07 DIAGNOSIS — N2581 Secondary hyperparathyroidism of renal origin: Secondary | ICD-10-CM | POA: Diagnosis not present

## 2023-02-07 DIAGNOSIS — Z992 Dependence on renal dialysis: Secondary | ICD-10-CM | POA: Diagnosis not present

## 2023-02-07 DIAGNOSIS — N186 End stage renal disease: Secondary | ICD-10-CM | POA: Diagnosis not present

## 2023-02-07 DIAGNOSIS — D631 Anemia in chronic kidney disease: Secondary | ICD-10-CM | POA: Diagnosis not present

## 2023-02-07 DIAGNOSIS — E1122 Type 2 diabetes mellitus with diabetic chronic kidney disease: Secondary | ICD-10-CM | POA: Diagnosis not present

## 2023-02-10 DIAGNOSIS — N2581 Secondary hyperparathyroidism of renal origin: Secondary | ICD-10-CM | POA: Diagnosis not present

## 2023-02-10 DIAGNOSIS — Z992 Dependence on renal dialysis: Secondary | ICD-10-CM | POA: Diagnosis not present

## 2023-02-10 DIAGNOSIS — D631 Anemia in chronic kidney disease: Secondary | ICD-10-CM | POA: Diagnosis not present

## 2023-02-10 DIAGNOSIS — N186 End stage renal disease: Secondary | ICD-10-CM | POA: Diagnosis not present

## 2023-02-10 DIAGNOSIS — E1122 Type 2 diabetes mellitus with diabetic chronic kidney disease: Secondary | ICD-10-CM | POA: Diagnosis not present

## 2023-02-12 DIAGNOSIS — D631 Anemia in chronic kidney disease: Secondary | ICD-10-CM | POA: Diagnosis not present

## 2023-02-12 DIAGNOSIS — N2581 Secondary hyperparathyroidism of renal origin: Secondary | ICD-10-CM | POA: Diagnosis not present

## 2023-02-12 DIAGNOSIS — E1122 Type 2 diabetes mellitus with diabetic chronic kidney disease: Secondary | ICD-10-CM | POA: Diagnosis not present

## 2023-02-12 DIAGNOSIS — Z992 Dependence on renal dialysis: Secondary | ICD-10-CM | POA: Diagnosis not present

## 2023-02-12 DIAGNOSIS — N186 End stage renal disease: Secondary | ICD-10-CM | POA: Diagnosis not present

## 2023-02-14 DIAGNOSIS — D631 Anemia in chronic kidney disease: Secondary | ICD-10-CM | POA: Diagnosis not present

## 2023-02-14 DIAGNOSIS — Z992 Dependence on renal dialysis: Secondary | ICD-10-CM | POA: Diagnosis not present

## 2023-02-14 DIAGNOSIS — N2581 Secondary hyperparathyroidism of renal origin: Secondary | ICD-10-CM | POA: Diagnosis not present

## 2023-02-14 DIAGNOSIS — E1122 Type 2 diabetes mellitus with diabetic chronic kidney disease: Secondary | ICD-10-CM | POA: Diagnosis not present

## 2023-02-14 DIAGNOSIS — N186 End stage renal disease: Secondary | ICD-10-CM | POA: Diagnosis not present

## 2023-02-17 DIAGNOSIS — N186 End stage renal disease: Secondary | ICD-10-CM | POA: Diagnosis not present

## 2023-02-17 DIAGNOSIS — D631 Anemia in chronic kidney disease: Secondary | ICD-10-CM | POA: Diagnosis not present

## 2023-02-17 DIAGNOSIS — E1122 Type 2 diabetes mellitus with diabetic chronic kidney disease: Secondary | ICD-10-CM | POA: Diagnosis not present

## 2023-02-17 DIAGNOSIS — N2581 Secondary hyperparathyroidism of renal origin: Secondary | ICD-10-CM | POA: Diagnosis not present

## 2023-02-17 DIAGNOSIS — Z992 Dependence on renal dialysis: Secondary | ICD-10-CM | POA: Diagnosis not present

## 2023-02-19 DIAGNOSIS — D631 Anemia in chronic kidney disease: Secondary | ICD-10-CM | POA: Diagnosis not present

## 2023-02-19 DIAGNOSIS — N2581 Secondary hyperparathyroidism of renal origin: Secondary | ICD-10-CM | POA: Diagnosis not present

## 2023-02-19 DIAGNOSIS — E1122 Type 2 diabetes mellitus with diabetic chronic kidney disease: Secondary | ICD-10-CM | POA: Diagnosis not present

## 2023-02-19 DIAGNOSIS — Z992 Dependence on renal dialysis: Secondary | ICD-10-CM | POA: Diagnosis not present

## 2023-02-19 DIAGNOSIS — N186 End stage renal disease: Secondary | ICD-10-CM | POA: Diagnosis not present

## 2023-02-21 DIAGNOSIS — N186 End stage renal disease: Secondary | ICD-10-CM | POA: Diagnosis not present

## 2023-02-21 DIAGNOSIS — E1122 Type 2 diabetes mellitus with diabetic chronic kidney disease: Secondary | ICD-10-CM | POA: Diagnosis not present

## 2023-02-21 DIAGNOSIS — D631 Anemia in chronic kidney disease: Secondary | ICD-10-CM | POA: Diagnosis not present

## 2023-02-21 DIAGNOSIS — N2581 Secondary hyperparathyroidism of renal origin: Secondary | ICD-10-CM | POA: Diagnosis not present

## 2023-02-21 DIAGNOSIS — Z992 Dependence on renal dialysis: Secondary | ICD-10-CM | POA: Diagnosis not present

## 2023-02-24 DIAGNOSIS — N186 End stage renal disease: Secondary | ICD-10-CM | POA: Diagnosis not present

## 2023-02-24 DIAGNOSIS — E1122 Type 2 diabetes mellitus with diabetic chronic kidney disease: Secondary | ICD-10-CM | POA: Diagnosis not present

## 2023-02-24 DIAGNOSIS — N2581 Secondary hyperparathyroidism of renal origin: Secondary | ICD-10-CM | POA: Diagnosis not present

## 2023-02-24 DIAGNOSIS — D631 Anemia in chronic kidney disease: Secondary | ICD-10-CM | POA: Diagnosis not present

## 2023-02-24 DIAGNOSIS — Z992 Dependence on renal dialysis: Secondary | ICD-10-CM | POA: Diagnosis not present

## 2023-02-26 DIAGNOSIS — N2581 Secondary hyperparathyroidism of renal origin: Secondary | ICD-10-CM | POA: Diagnosis not present

## 2023-02-26 DIAGNOSIS — D631 Anemia in chronic kidney disease: Secondary | ICD-10-CM | POA: Diagnosis not present

## 2023-02-26 DIAGNOSIS — E1122 Type 2 diabetes mellitus with diabetic chronic kidney disease: Secondary | ICD-10-CM | POA: Diagnosis not present

## 2023-02-26 DIAGNOSIS — N186 End stage renal disease: Secondary | ICD-10-CM | POA: Diagnosis not present

## 2023-02-26 DIAGNOSIS — Z992 Dependence on renal dialysis: Secondary | ICD-10-CM | POA: Diagnosis not present

## 2023-02-28 DIAGNOSIS — D631 Anemia in chronic kidney disease: Secondary | ICD-10-CM | POA: Diagnosis not present

## 2023-02-28 DIAGNOSIS — N186 End stage renal disease: Secondary | ICD-10-CM | POA: Diagnosis not present

## 2023-02-28 DIAGNOSIS — N2581 Secondary hyperparathyroidism of renal origin: Secondary | ICD-10-CM | POA: Diagnosis not present

## 2023-02-28 DIAGNOSIS — E1122 Type 2 diabetes mellitus with diabetic chronic kidney disease: Secondary | ICD-10-CM | POA: Diagnosis not present

## 2023-02-28 DIAGNOSIS — Z992 Dependence on renal dialysis: Secondary | ICD-10-CM | POA: Diagnosis not present

## 2023-03-03 DIAGNOSIS — N186 End stage renal disease: Secondary | ICD-10-CM | POA: Diagnosis not present

## 2023-03-03 DIAGNOSIS — E1122 Type 2 diabetes mellitus with diabetic chronic kidney disease: Secondary | ICD-10-CM | POA: Diagnosis not present

## 2023-03-03 DIAGNOSIS — D631 Anemia in chronic kidney disease: Secondary | ICD-10-CM | POA: Diagnosis not present

## 2023-03-03 DIAGNOSIS — Z992 Dependence on renal dialysis: Secondary | ICD-10-CM | POA: Diagnosis not present

## 2023-03-03 DIAGNOSIS — N2581 Secondary hyperparathyroidism of renal origin: Secondary | ICD-10-CM | POA: Diagnosis not present

## 2023-03-05 DIAGNOSIS — D631 Anemia in chronic kidney disease: Secondary | ICD-10-CM | POA: Diagnosis not present

## 2023-03-05 DIAGNOSIS — N186 End stage renal disease: Secondary | ICD-10-CM | POA: Diagnosis not present

## 2023-03-05 DIAGNOSIS — Z992 Dependence on renal dialysis: Secondary | ICD-10-CM | POA: Diagnosis not present

## 2023-03-05 DIAGNOSIS — N2581 Secondary hyperparathyroidism of renal origin: Secondary | ICD-10-CM | POA: Diagnosis not present

## 2023-03-07 DIAGNOSIS — D631 Anemia in chronic kidney disease: Secondary | ICD-10-CM | POA: Diagnosis not present

## 2023-03-07 DIAGNOSIS — Z992 Dependence on renal dialysis: Secondary | ICD-10-CM | POA: Diagnosis not present

## 2023-03-07 DIAGNOSIS — N2581 Secondary hyperparathyroidism of renal origin: Secondary | ICD-10-CM | POA: Diagnosis not present

## 2023-03-07 DIAGNOSIS — N186 End stage renal disease: Secondary | ICD-10-CM | POA: Diagnosis not present

## 2023-03-10 DIAGNOSIS — Z992 Dependence on renal dialysis: Secondary | ICD-10-CM | POA: Diagnosis not present

## 2023-03-10 DIAGNOSIS — D631 Anemia in chronic kidney disease: Secondary | ICD-10-CM | POA: Diagnosis not present

## 2023-03-10 DIAGNOSIS — N186 End stage renal disease: Secondary | ICD-10-CM | POA: Diagnosis not present

## 2023-03-10 DIAGNOSIS — N2581 Secondary hyperparathyroidism of renal origin: Secondary | ICD-10-CM | POA: Diagnosis not present

## 2023-03-12 DIAGNOSIS — D631 Anemia in chronic kidney disease: Secondary | ICD-10-CM | POA: Diagnosis not present

## 2023-03-12 DIAGNOSIS — N2581 Secondary hyperparathyroidism of renal origin: Secondary | ICD-10-CM | POA: Diagnosis not present

## 2023-03-12 DIAGNOSIS — E1122 Type 2 diabetes mellitus with diabetic chronic kidney disease: Secondary | ICD-10-CM | POA: Diagnosis not present

## 2023-03-12 DIAGNOSIS — N186 End stage renal disease: Secondary | ICD-10-CM | POA: Diagnosis not present

## 2023-03-12 DIAGNOSIS — Z992 Dependence on renal dialysis: Secondary | ICD-10-CM | POA: Diagnosis not present

## 2023-03-14 DIAGNOSIS — Z992 Dependence on renal dialysis: Secondary | ICD-10-CM | POA: Diagnosis not present

## 2023-03-14 DIAGNOSIS — N186 End stage renal disease: Secondary | ICD-10-CM | POA: Diagnosis not present

## 2023-03-14 DIAGNOSIS — N2581 Secondary hyperparathyroidism of renal origin: Secondary | ICD-10-CM | POA: Diagnosis not present

## 2023-03-14 DIAGNOSIS — D631 Anemia in chronic kidney disease: Secondary | ICD-10-CM | POA: Diagnosis not present

## 2023-03-17 DIAGNOSIS — Z992 Dependence on renal dialysis: Secondary | ICD-10-CM | POA: Diagnosis not present

## 2023-03-17 DIAGNOSIS — D631 Anemia in chronic kidney disease: Secondary | ICD-10-CM | POA: Diagnosis not present

## 2023-03-17 DIAGNOSIS — N2581 Secondary hyperparathyroidism of renal origin: Secondary | ICD-10-CM | POA: Diagnosis not present

## 2023-03-17 DIAGNOSIS — N186 End stage renal disease: Secondary | ICD-10-CM | POA: Diagnosis not present

## 2023-03-19 DIAGNOSIS — D631 Anemia in chronic kidney disease: Secondary | ICD-10-CM | POA: Diagnosis not present

## 2023-03-19 DIAGNOSIS — N186 End stage renal disease: Secondary | ICD-10-CM | POA: Diagnosis not present

## 2023-03-19 DIAGNOSIS — Z992 Dependence on renal dialysis: Secondary | ICD-10-CM | POA: Diagnosis not present

## 2023-03-19 DIAGNOSIS — N2581 Secondary hyperparathyroidism of renal origin: Secondary | ICD-10-CM | POA: Diagnosis not present

## 2023-03-21 ENCOUNTER — Ambulatory Visit
Admission: RE | Admit: 2023-03-21 | Discharge: 2023-03-21 | Disposition: A | Discharge status: Home / Self Care | Payer: Medicare Other | Source: Ambulatory Visit | Attending: Internal Medicine | Admitting: Internal Medicine

## 2023-03-21 ENCOUNTER — Other Ambulatory Visit: Payer: Self-pay | Admitting: Internal Medicine

## 2023-03-21 DIAGNOSIS — N6489 Other specified disorders of breast: Secondary | ICD-10-CM | POA: Diagnosis not present

## 2023-03-21 DIAGNOSIS — R2232 Localized swelling, mass and lump, left upper limb: Secondary | ICD-10-CM

## 2023-03-21 DIAGNOSIS — D631 Anemia in chronic kidney disease: Secondary | ICD-10-CM | POA: Diagnosis not present

## 2023-03-21 DIAGNOSIS — Z992 Dependence on renal dialysis: Secondary | ICD-10-CM | POA: Diagnosis not present

## 2023-03-21 DIAGNOSIS — N6310 Unspecified lump in the right breast, unspecified quadrant: Secondary | ICD-10-CM

## 2023-03-21 DIAGNOSIS — D6869 Other thrombophilia: Secondary | ICD-10-CM | POA: Diagnosis not present

## 2023-03-21 DIAGNOSIS — E1121 Type 2 diabetes mellitus with diabetic nephropathy: Secondary | ICD-10-CM | POA: Diagnosis not present

## 2023-03-21 DIAGNOSIS — N186 End stage renal disease: Secondary | ICD-10-CM | POA: Diagnosis not present

## 2023-03-21 DIAGNOSIS — R928 Other abnormal and inconclusive findings on diagnostic imaging of breast: Secondary | ICD-10-CM | POA: Diagnosis not present

## 2023-03-21 DIAGNOSIS — E2839 Other primary ovarian failure: Secondary | ICD-10-CM

## 2023-03-21 DIAGNOSIS — I480 Paroxysmal atrial fibrillation: Secondary | ICD-10-CM | POA: Diagnosis not present

## 2023-03-21 DIAGNOSIS — Z780 Asymptomatic menopausal state: Secondary | ICD-10-CM | POA: Diagnosis not present

## 2023-03-21 DIAGNOSIS — I129 Hypertensive chronic kidney disease with stage 1 through stage 4 chronic kidney disease, or unspecified chronic kidney disease: Secondary | ICD-10-CM | POA: Diagnosis not present

## 2023-03-21 DIAGNOSIS — N2581 Secondary hyperparathyroidism of renal origin: Secondary | ICD-10-CM | POA: Diagnosis not present

## 2023-03-24 DIAGNOSIS — D631 Anemia in chronic kidney disease: Secondary | ICD-10-CM | POA: Diagnosis not present

## 2023-03-24 DIAGNOSIS — N186 End stage renal disease: Secondary | ICD-10-CM | POA: Diagnosis not present

## 2023-03-24 DIAGNOSIS — N2581 Secondary hyperparathyroidism of renal origin: Secondary | ICD-10-CM | POA: Diagnosis not present

## 2023-03-24 DIAGNOSIS — Z992 Dependence on renal dialysis: Secondary | ICD-10-CM | POA: Diagnosis not present

## 2023-03-26 DIAGNOSIS — Z992 Dependence on renal dialysis: Secondary | ICD-10-CM | POA: Diagnosis not present

## 2023-03-26 DIAGNOSIS — N186 End stage renal disease: Secondary | ICD-10-CM | POA: Diagnosis not present

## 2023-03-26 DIAGNOSIS — N2581 Secondary hyperparathyroidism of renal origin: Secondary | ICD-10-CM | POA: Diagnosis not present

## 2023-03-26 DIAGNOSIS — D631 Anemia in chronic kidney disease: Secondary | ICD-10-CM | POA: Diagnosis not present

## 2023-03-28 DIAGNOSIS — N2581 Secondary hyperparathyroidism of renal origin: Secondary | ICD-10-CM | POA: Diagnosis not present

## 2023-03-28 DIAGNOSIS — Z992 Dependence on renal dialysis: Secondary | ICD-10-CM | POA: Diagnosis not present

## 2023-03-28 DIAGNOSIS — N186 End stage renal disease: Secondary | ICD-10-CM | POA: Diagnosis not present

## 2023-03-28 DIAGNOSIS — D631 Anemia in chronic kidney disease: Secondary | ICD-10-CM | POA: Diagnosis not present

## 2023-03-31 DIAGNOSIS — N186 End stage renal disease: Secondary | ICD-10-CM | POA: Diagnosis not present

## 2023-03-31 DIAGNOSIS — D631 Anemia in chronic kidney disease: Secondary | ICD-10-CM | POA: Diagnosis not present

## 2023-03-31 DIAGNOSIS — N2581 Secondary hyperparathyroidism of renal origin: Secondary | ICD-10-CM | POA: Diagnosis not present

## 2023-03-31 DIAGNOSIS — Z992 Dependence on renal dialysis: Secondary | ICD-10-CM | POA: Diagnosis not present

## 2023-04-01 ENCOUNTER — Ambulatory Visit (INDEPENDENT_AMBULATORY_CARE_PROVIDER_SITE_OTHER): Payer: Medicare Other | Admitting: Podiatry

## 2023-04-01 VITALS — BP 103/51

## 2023-04-01 DIAGNOSIS — L84 Corns and callosities: Secondary | ICD-10-CM

## 2023-04-01 DIAGNOSIS — N186 End stage renal disease: Secondary | ICD-10-CM | POA: Diagnosis not present

## 2023-04-01 DIAGNOSIS — M2041 Other hammer toe(s) (acquired), right foot: Secondary | ICD-10-CM | POA: Diagnosis not present

## 2023-04-01 DIAGNOSIS — B351 Tinea unguium: Secondary | ICD-10-CM | POA: Diagnosis not present

## 2023-04-01 DIAGNOSIS — M79674 Pain in right toe(s): Secondary | ICD-10-CM | POA: Diagnosis not present

## 2023-04-01 DIAGNOSIS — E0822 Diabetes mellitus due to underlying condition with diabetic chronic kidney disease: Secondary | ICD-10-CM

## 2023-04-01 DIAGNOSIS — E119 Type 2 diabetes mellitus without complications: Secondary | ICD-10-CM

## 2023-04-01 DIAGNOSIS — M79675 Pain in left toe(s): Secondary | ICD-10-CM | POA: Diagnosis not present

## 2023-04-01 DIAGNOSIS — M2042 Other hammer toe(s) (acquired), left foot: Secondary | ICD-10-CM | POA: Diagnosis not present

## 2023-04-02 DIAGNOSIS — D631 Anemia in chronic kidney disease: Secondary | ICD-10-CM | POA: Diagnosis not present

## 2023-04-02 DIAGNOSIS — N2581 Secondary hyperparathyroidism of renal origin: Secondary | ICD-10-CM | POA: Diagnosis not present

## 2023-04-02 DIAGNOSIS — E1122 Type 2 diabetes mellitus with diabetic chronic kidney disease: Secondary | ICD-10-CM | POA: Diagnosis not present

## 2023-04-02 DIAGNOSIS — N186 End stage renal disease: Secondary | ICD-10-CM | POA: Diagnosis not present

## 2023-04-02 DIAGNOSIS — Z992 Dependence on renal dialysis: Secondary | ICD-10-CM | POA: Diagnosis not present

## 2023-04-03 ENCOUNTER — Encounter: Payer: Self-pay | Admitting: Podiatry
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
## 2023-04-04 ENCOUNTER — Ambulatory Visit
Admission: RE | Admit: 2023-04-04 | Discharge: 2023-04-04 | Disposition: A | Discharge status: Home / Self Care | Payer: Medicare Other | Source: Ambulatory Visit | Attending: Internal Medicine | Admitting: Internal Medicine

## 2023-04-04 DIAGNOSIS — Z992 Dependence on renal dialysis: Secondary | ICD-10-CM | POA: Diagnosis not present

## 2023-04-04 DIAGNOSIS — N6310 Unspecified lump in the right breast, unspecified quadrant: Secondary | ICD-10-CM

## 2023-04-04 DIAGNOSIS — N186 End stage renal disease: Secondary | ICD-10-CM | POA: Diagnosis not present

## 2023-04-04 DIAGNOSIS — N2581 Secondary hyperparathyroidism of renal origin: Secondary | ICD-10-CM | POA: Diagnosis not present

## 2023-04-04 DIAGNOSIS — D631 Anemia in chronic kidney disease: Secondary | ICD-10-CM | POA: Diagnosis not present

## 2023-04-04 DIAGNOSIS — N6315 Unspecified lump in the right breast, overlapping quadrants: Secondary | ICD-10-CM | POA: Diagnosis not present

## 2023-04-04 DIAGNOSIS — N63 Unspecified lump in unspecified breast: Secondary | ICD-10-CM | POA: Diagnosis not present

## 2023-04-04 DIAGNOSIS — C50919 Malignant neoplasm of unspecified site of unspecified female breast: Secondary | ICD-10-CM

## 2023-04-04 HISTORY — DX: Malignant neoplasm of unspecified site of unspecified female breast: C50.919

## 2023-04-04 HISTORY — PX: BREAST BIOPSY: SHX20

## 2023-04-07 DIAGNOSIS — Z992 Dependence on renal dialysis: Secondary | ICD-10-CM | POA: Diagnosis not present

## 2023-04-07 DIAGNOSIS — N186 End stage renal disease: Secondary | ICD-10-CM | POA: Diagnosis not present

## 2023-04-07 DIAGNOSIS — D631 Anemia in chronic kidney disease: Secondary | ICD-10-CM | POA: Diagnosis not present

## 2023-04-07 DIAGNOSIS — N2581 Secondary hyperparathyroidism of renal origin: Secondary | ICD-10-CM | POA: Diagnosis not present

## 2023-04-09 DIAGNOSIS — Z992 Dependence on renal dialysis: Secondary | ICD-10-CM | POA: Diagnosis not present

## 2023-04-09 DIAGNOSIS — N2581 Secondary hyperparathyroidism of renal origin: Secondary | ICD-10-CM | POA: Diagnosis not present

## 2023-04-09 DIAGNOSIS — D631 Anemia in chronic kidney disease: Secondary | ICD-10-CM | POA: Diagnosis not present

## 2023-04-09 DIAGNOSIS — N186 End stage renal disease: Secondary | ICD-10-CM | POA: Diagnosis not present

## 2023-04-10 ENCOUNTER — Other Ambulatory Visit: Payer: Medicare Other

## 2023-04-11 DIAGNOSIS — D631 Anemia in chronic kidney disease: Secondary | ICD-10-CM | POA: Diagnosis not present

## 2023-04-11 DIAGNOSIS — N186 End stage renal disease: Secondary | ICD-10-CM | POA: Diagnosis not present

## 2023-04-11 DIAGNOSIS — N2581 Secondary hyperparathyroidism of renal origin: Secondary | ICD-10-CM | POA: Diagnosis not present

## 2023-04-11 DIAGNOSIS — Z992 Dependence on renal dialysis: Secondary | ICD-10-CM | POA: Diagnosis not present

## 2023-04-14 DIAGNOSIS — N186 End stage renal disease: Secondary | ICD-10-CM | POA: Diagnosis not present

## 2023-04-14 DIAGNOSIS — N2581 Secondary hyperparathyroidism of renal origin: Secondary | ICD-10-CM | POA: Diagnosis not present

## 2023-04-14 DIAGNOSIS — Z992 Dependence on renal dialysis: Secondary | ICD-10-CM | POA: Diagnosis not present

## 2023-04-14 DIAGNOSIS — D631 Anemia in chronic kidney disease: Secondary | ICD-10-CM | POA: Diagnosis not present

## 2023-04-16 DIAGNOSIS — D631 Anemia in chronic kidney disease: Secondary | ICD-10-CM | POA: Diagnosis not present

## 2023-04-16 DIAGNOSIS — N186 End stage renal disease: Secondary | ICD-10-CM | POA: Diagnosis not present

## 2023-04-16 DIAGNOSIS — Z992 Dependence on renal dialysis: Secondary | ICD-10-CM | POA: Diagnosis not present

## 2023-04-16 DIAGNOSIS — N2581 Secondary hyperparathyroidism of renal origin: Secondary | ICD-10-CM | POA: Diagnosis not present

## 2023-04-17 ENCOUNTER — Ambulatory Visit: Payer: Self-pay | Admitting: General Surgery

## 2023-04-17 DIAGNOSIS — C50511 Malignant neoplasm of lower-outer quadrant of right female breast: Secondary | ICD-10-CM | POA: Diagnosis not present

## 2023-04-17 DIAGNOSIS — Z170 Estrogen receptor positive status [ER+]: Secondary | ICD-10-CM | POA: Diagnosis not present

## 2023-04-17 MED ORDER — KETOROLAC TROMETHAMINE 15 MG/ML IJ SOLN
15.0000 mg | Freq: Once | INTRAMUSCULAR | Status: AC
Start: 2023-04-17 — End: 2023-04-22

## 2023-04-18 ENCOUNTER — Telehealth: Payer: Self-pay

## 2023-04-18 DIAGNOSIS — N186 End stage renal disease: Secondary | ICD-10-CM | POA: Diagnosis not present

## 2023-04-18 DIAGNOSIS — D631 Anemia in chronic kidney disease: Secondary | ICD-10-CM | POA: Diagnosis not present

## 2023-04-18 DIAGNOSIS — Z992 Dependence on renal dialysis: Secondary | ICD-10-CM | POA: Diagnosis not present

## 2023-04-18 DIAGNOSIS — N2581 Secondary hyperparathyroidism of renal origin: Secondary | ICD-10-CM | POA: Diagnosis not present

## 2023-04-18 NOTE — Telephone Encounter (Signed)
   Pre-operative Risk Assessment    Patient Name: Brenda Arnold  DOB: Sep 30, 1956 MRN: [id number]     Request for Surgical Clearance    Procedure:  Lumpectomy   Date of Surgery:  Clearance TBD                                 Surgeon:  Dr. Chevis Pretty Surgeon's Group or Practice Name:  [facility]  Phone number:  [phone] Fax number:  [phone] Attn: [name], CMA   Type of Clearance Requested:   - Pharmacy:  Hold Apixaban (Eliquis)     Type of Anesthesia:  General    Additional requests/questions:    [name]   04/18/2023, 12:47 PM

## 2023-04-21 ENCOUNTER — Telehealth: Payer: Self-pay | Admitting: Radiation Oncology

## 2023-04-21 DIAGNOSIS — D631 Anemia in chronic kidney disease: Secondary | ICD-10-CM | POA: Diagnosis not present

## 2023-04-21 DIAGNOSIS — N186 End stage renal disease: Secondary | ICD-10-CM | POA: Diagnosis not present

## 2023-04-21 DIAGNOSIS — N2581 Secondary hyperparathyroidism of renal origin: Secondary | ICD-10-CM | POA: Diagnosis not present

## 2023-04-21 DIAGNOSIS — Z992 Dependence on renal dialysis: Secondary | ICD-10-CM | POA: Diagnosis not present

## 2023-04-21 NOTE — Telephone Encounter (Signed)
Called patient to schedule a consultation w. Dr. Moody. No answer, LVM for a return call.  

## 2023-04-21 NOTE — Telephone Encounter (Signed)
Patient with diagnosis of PAF(paroxysmal atrial fibrillation),   on Eliquis for anticoagulation.    Procedure: Lumpectomy  Date of procedure: TBD   CHA2DS2-VASc Score = 6   This indicates a 9.7% annual risk of stroke. The patient's score is based upon: CHF History: 1 HTN History: 1 Diabetes History: 1 Stroke History: 0 Vascular Disease History: 1 Age Score: 1 Gender Score: 1    ESRD - dialysis  Platelet count 129 K ( 04/18/2022 From [facility]) Hx of provoked DVT in 2019 - after hip replacement off of warfarin at the time   Patient was cleared for 3 days hold in the past before L-THA (10/24/2020)  Patient can hold Eliquis for 3 days prior to procedure.     **This guidance is not considered finalized until pre-operative APP has relayed final recommendations.**

## 2023-04-21 NOTE — Telephone Encounter (Signed)
   Patient Name: Brenda Arnold  DOB: May 02, 1956 MRN: [id number]  Primary Cardiologist: Armanda Magic, MD  Clinical pharmacists have reviewed the patient's past medical history, labs, and current medications as part of preoperative protocol coverage. The following recommendations have been made:   Patient with diagnosis of PAF(paroxysmal atrial fibrillation),   on Eliquis for anticoagulation.     Procedure: Lumpectomy  Date of procedure: TBD     CHA2DS2-VASc Score = 6   This indicates a 9.7% annual risk of stroke. The patient's score is based upon: CHF History: 1 HTN History: 1 Diabetes History: 1 Stroke History: 0 Vascular Disease History: 1 Age Score: 1 Gender Score: 1     ESRD - dialysis  Platelet count 129 K ( 04/18/2022 From [facility]) Hx of provoked DVT in 2019 - after hip replacement off of warfarin at the time    Patient was cleared for 3 days hold in the past before L-THA (10/24/2020)  Patient can hold Eliquis for 3 days prior to procedure.  Please resume Eliquis as soon as possible postprocedure, at the discretion of the surgeon.    I will route this recommendation to the requesting party via Epic fax function and remove from pre-op pool.  Please call with questions.  Joylene Grapes, NP 04/21/2023, 1:28 PM

## 2023-04-22 ENCOUNTER — Telehealth: Payer: Self-pay | Admitting: Radiation Oncology

## 2023-04-22 NOTE — Telephone Encounter (Signed)
Called patient to schedule a consultation w. Dr. Moody. No answer, LVM for a return call.  

## 2023-04-23 DIAGNOSIS — N2581 Secondary hyperparathyroidism of renal origin: Secondary | ICD-10-CM | POA: Diagnosis not present

## 2023-04-23 DIAGNOSIS — Z992 Dependence on renal dialysis: Secondary | ICD-10-CM | POA: Diagnosis not present

## 2023-04-23 DIAGNOSIS — N186 End stage renal disease: Secondary | ICD-10-CM | POA: Diagnosis not present

## 2023-04-23 DIAGNOSIS — D631 Anemia in chronic kidney disease: Secondary | ICD-10-CM | POA: Diagnosis not present

## 2023-04-23 NOTE — Progress Notes (Signed)
New Breast Cancer Diagnosis: Right Breast   Did patient present with symptoms (if so, please note symptoms) or screening mammography?:Screening Mass    Location and Extent of disease :right breast. Located at 6 o'clock position, measured 1.3 cm in greatest dimension. Adenopathy no.  Histology per Pathology Report: grade 1, Invasive Ductal Carcinoma 04/04/2023  Receptor Status: ER(positive), PR (negative), Her2-neu (negative), Ki-(10%)   Surgeon and surgical plan, if any:  Dr. Carolynne Edouard 04/17/2023 -Breast conservation. -Does not need node evaluation. -Cardiac clearance.  Medical oncologist, treatment if any:   Dr. Al Pimple 05/06/2023   Family History of Breast/Ovarian/Prostate Cancer:   Lymphedema issues, if any:      Pain issues, if any:     SAFETY ISSUES: Prior radiation?  Pacemaker/ICD?  Possible current pregnancy? Is the patient on methotrexate?   Current Complaints / other details:

## 2023-04-24 ENCOUNTER — Ambulatory Visit
Admission: RE | Admit: 2023-04-24 | Discharge: 2023-04-24 | Discharge status: Home / Self Care | Payer: No Typology Code available for payment source | Source: Ambulatory Visit | Attending: Radiation Oncology | Admitting: Radiation Oncology

## 2023-04-24 ENCOUNTER — Encounter: Payer: Self-pay | Admitting: Radiation Oncology

## 2023-04-24 ENCOUNTER — Ambulatory Visit
Admission: RE | Admit: 2023-04-24 | Discharge: 2023-04-24 | Disposition: A | Discharge status: Home / Self Care | Payer: Medicare Other | Source: Ambulatory Visit | Attending: Radiation Oncology | Admitting: Radiation Oncology

## 2023-04-24 VITALS — BP 113/55 | HR 88 | Temp 97.1°F | Resp 18 | Ht 67.0 in | Wt 243.2 lb

## 2023-04-24 DIAGNOSIS — Z86718 Personal history of other venous thrombosis and embolism: Secondary | ICD-10-CM | POA: Diagnosis not present

## 2023-04-24 DIAGNOSIS — Z170 Estrogen receptor positive status [ER+]: Secondary | ICD-10-CM | POA: Diagnosis not present

## 2023-04-24 DIAGNOSIS — E282 Polycystic ovarian syndrome: Secondary | ICD-10-CM | POA: Insufficient documentation

## 2023-04-24 DIAGNOSIS — E559 Vitamin D deficiency, unspecified: Secondary | ICD-10-CM | POA: Diagnosis not present

## 2023-04-24 DIAGNOSIS — Z801 Family history of malignant neoplasm of trachea, bronchus and lung: Secondary | ICD-10-CM | POA: Diagnosis not present

## 2023-04-24 DIAGNOSIS — I132 Hypertensive heart and chronic kidney disease with heart failure and with stage 5 chronic kidney disease, or end stage renal disease: Secondary | ICD-10-CM | POA: Insufficient documentation

## 2023-04-24 DIAGNOSIS — E785 Hyperlipidemia, unspecified: Secondary | ICD-10-CM | POA: Insufficient documentation

## 2023-04-24 DIAGNOSIS — C50811 Malignant neoplasm of overlapping sites of right female breast: Secondary | ICD-10-CM | POA: Insufficient documentation

## 2023-04-24 DIAGNOSIS — Z79899 Other long term (current) drug therapy: Secondary | ICD-10-CM | POA: Diagnosis not present

## 2023-04-24 DIAGNOSIS — Z992 Dependence on renal dialysis: Secondary | ICD-10-CM

## 2023-04-24 DIAGNOSIS — I480 Paroxysmal atrial fibrillation: Secondary | ICD-10-CM | POA: Insufficient documentation

## 2023-04-24 DIAGNOSIS — G4730 Sleep apnea, unspecified: Secondary | ICD-10-CM | POA: Diagnosis not present

## 2023-04-24 DIAGNOSIS — N186 End stage renal disease: Secondary | ICD-10-CM

## 2023-04-24 DIAGNOSIS — E119 Type 2 diabetes mellitus without complications: Secondary | ICD-10-CM | POA: Insufficient documentation

## 2023-04-24 DIAGNOSIS — M129 Arthropathy, unspecified: Secondary | ICD-10-CM | POA: Insufficient documentation

## 2023-04-24 NOTE — Progress Notes (Signed)
Radiation Oncology         [phone] ________________________________  Name: Brenda Arnold        MRN: [id number]  Date of Service: 04/24/2023 DOB: Sep 13, 1956  GE:[id number], Kasandra Knudsen, MD  Griselda Miner, MD     REFERRING PHYSICIAN: Chevis Pretty III, MD   DIAGNOSIS: The encounter diagnosis was Malignant neoplasm of overlapping sites of right breast in female, estrogen receptor positive (HCC).   HISTORY OF PRESENT ILLNESS: Brenda Arnold is a 67 y.o. female seen at the request of Dr. Carolynne Edouard for a new diagnosis of right breast cancer. The patient was noted to have screening detected mass on mammography. By ultrasound, a mass in the 6:00 position was noted measuring 1.3 cm in greatest dimension. Her right axilla was negative for adenopathy and there were two sebaceous appearing cystic lesions in her left axilla but these were not felt to be suspicious and did not require sampling. A right breast biopsy on 04/04/23 showed a grade 1 invasive ductal carcinoma that was ER positive, PR negative, and HER2 negative with a Ki 67 of 10%. She is planning on breast conservation with Dr. Carolynne Edouard and is awaiting the scheduling of a lumpectomy. Dr. Carolynne Edouard does not recommend sentinel node biopsy. She's seen today to discuss adjuvant radiotherapy, and is due to meet Dr. Al Pimple on 05/06/23.    PREVIOUS RADIATION THERAPY: No   PAST MEDICAL HISTORY:  Past Medical History:  Diagnosis Date   Agatston coronary artery calcium score greater than 400    Ca score 3824 on CT 08/2021   Anemia    Arthritis    Asthma    Complication of anesthesia    difficulty with getting oxygen saturation up- "patient was not aware"  Bottom of feet burning in PACU   Constipation    because of Iron   Diabetes mellitus    not on meds   DVT (deep venous thrombosis) (HCC) 2019   post hip replacement - right   Dyslipidemia    Epistaxis 11/05/2012   ESRD (end stage renal disease) on dialysis (HCC)    "MWF; [facility]" (09/15/2018)-  started 02/13/2017   History of blood transfusion    HTN (hypertension)    Hyperparathyroidism due to renal insufficiency (HCC)    Obesity    s/p panniculectomy   Paroxysmal A-fib (HCC)    a. chronic coumadin;  b. 12/2009 Echo: EF 60-65%, Gr 1 DD.   PCOS (polycystic ovarian syndrome)    Pericarditis 08/2019   pericarditis with pericardial effusion, s/p right VATS/pericardial window 08/31/19   Pneumonia    Seasonal allergies    Sleep apnea    a. not using CPAP, last study  >8 yrs   Vitamin D deficiency        PAST SURGICAL HISTORY: Past Surgical History:  Procedure Laterality Date   ABDOMINAL HYSTERECTOMY     with panniculctomy   AV FISTULA PLACEMENT  09/02/2012   Procedure: ARTERIOVENOUS (AV) FISTULA CREATION;  Surgeon: Sherren Kerns, MD;  Location: [facility] OR;  Service: Vascular;  Laterality: Left;  Creation of Left Radial-Cephalic Fistula    BREAST BIOPSY Right 04/04/2023   [place] RT BREAST BX W LOC DEV 1ST LESION IMG BX SPEC US GUIDE 04/04/2023 [facility] MAMMOGRAPHY   BREAST EXCISIONAL BIOPSY Right    BREAST SURGERY     Biopsy right breast   COLONOSCOPY     COLONOSCOPY W/ BIOPSIES AND POLYPECTOMY     DILATION AND CURETTAGE OF UTERUS  KNEE ARTHROSCOPY Right    PANNICULECTOMY     REVISON OF ARTERIOVENOUS FISTULA Left 04/27/2014   Procedure: REVISON OF LEFT RADIAL-CEPHALIC ARTERIOVENOUS FISTULA;  Surgeon: Pryor Ochoa, MD;  Location: [facility] OR;  Service: Vascular;  Laterality: Left;   REVISON OF ARTERIOVENOUS FISTULA Left 01/13/2020   Procedure: REVISON OF ARTERIOVENOUS FISTULA;  Surgeon: Cephus Shelling, MD;  Location[facility] OR;  Service: Vascular;  Laterality: Left;   TOTAL HIP ARTHROPLASTY Right 09/15/2018   TOTAL HIP ARTHROPLASTY Right 09/15/2018   Procedure: RIGHT TOTAL HIP ARTHROPLASTY ANTERIOR APPROACH;  Surgeon: Tarry Kos, MD;  Location: [facility] OR;  Service: Orthopedics;  Laterality: Right;   TOTAL HIP ARTHROPLASTY Left 11/23/2020   Procedure: LEFT TOTAL HIP ARTHROPLASTY  ANTERIOR APPROACH;  Surgeon: Tarry Kos, MD;  Location: [facility] OR;  Service: Orthopedics;  Laterality: Left;  NEEDS RNFA PLEASE   UVULOPLASTY     VIDEO ASSISTED THORACOSCOPY (VATS)/WEDGE RESECTION Right 08/31/2019   Procedure: VIDEO ASSISTED THORACOSCOPY/ DRAINAGE OF PERICARDIAL EFFUSION/ PERICARDIAL WINDOW/ ABORTED PERICARDIOCENTESIS ;  Surgeon: Corliss Skains, MD;  Location: [facility] OR;  Service: Thoracic;  Laterality: Right;     FAMILY HISTORY:  Family History  Problem Relation Age of Onset   Lung cancer Father        died @ 32   Hypertension Mother    Diabetes Brother    Kidney disease Brother      SOCIAL HISTORY:  reports that she has never smoked. She has never used smokeless tobacco. She reports that she does not drink alcohol and does not use drugs. The patient is single and lives in [place]. 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    ALLERGIES: Other, Shellfish allergy, Shellfish-derived products, Amlodipine, Felodipine, Lisinopril, and Chlorhexidine   MEDICATIONS:  Current Outpatient Medications  Medication Sig Dispense Refill   acetaminophen (TYLENOL) 500 MG tablet Take 2 tablets (1,000 mg total) by mouth every 8 (eight) hours as needed for mild pain.     amiodarone (PACERONE) 200 MG tablet Take 1 tablet (200 mg total) by mouth daily. 90 tablet 3   atorvastatin (LIPITOR) 40 MG tablet Take 1 tablet (40 mg total) by mouth daily. 90 tablet 3   B Complex-C-Folic Acid (DIALYVITE TABLET) TABS Take 1 tablet by mouth daily.     bisacodyl (DULCOLAX) 5 MG EC tablet Take 1 tablet (5 mg total) by mouth daily as needed for moderate constipation.     colchicine 0.6 MG tablet TAKE 0.5 TABLETS (0.3 MG TOTAL) BY MOUTH EVERY MONDAY, WEDNESDAY, AND FRIDAY. 45 tablet 3   ELIQUIS 5 MG TABS tablet Take 1 tablet (5 mg total) by mouth 2 (two) times daily. 180 tablet 3   Etelcalcetide HCl (PARSABIV IV) Etelcalcetide (Parsabiv)     ethyl chloride spray Apply 1  application. topically See admin instructions. 1 application only on Mon, Wed, Fridays     fluticasone (FLONASE) 50 MCG/ACT nasal spray Place 2 sprays into both nostrils daily. 9.9 mL 0   gabapentin (NEURONTIN) 100 MG capsule Take 100 mg by mouth at bedtime.  3   loratadine (CLARITIN) 10 MG tablet Take 1 tablet (10 mg total) by mouth daily. 30 tablet 0   methocarbamol (ROBAXIN) 500 MG tablet Take 1 tablet (500 mg total) by mouth every 8 (eight) hours as needed for muscle spasms. 30 tablet 0   midodrine (PROAMATINE) 10 MG tablet Take 1 tablet (10 mg total) by mouth 3 (three) times daily with meals.  sucroferric oxyhydroxide (VELPHORO) 500 MG chewable tablet Chew 2 tablets (1,000 mg total) by mouth 3 (three) times daily with meals. 180 tablet 0   No current facility-administered medications for this visit.     REVIEW OF SYSTEMS: On review of systems, the patient reports that she is doing well overall without breast specific complaints. She reports she is followed with Dr. Marisue Humble and the PAs at [facility].      PHYSICAL EXAM:  Wt Readings from Last 3 Encounters:  09/27/22 239 lb (108.4 kg)  04/17/22 244 lb 4.3 oz (110.8 kg)  09/18/21 236 lb (107 kg)   Temp Readings from Last 3 Encounters:  04/18/22 98.7 F (37.1 C) (Oral)  03/18/21 98.6 F (37 C) (Oral)  11/29/20 98.3 F (36.8 C) (Oral)   BP Readings from Last 3 Encounters:  04/01/23 (!) 103/51  09/27/22 124/78  04/18/22 (!) 99/41   Pulse Readings from Last 3 Encounters:  09/27/22 (!) 102  04/18/22 66  07/30/21 60    In general this is a well appearing African American female in no acute distress. She's alert and oriented x4 and appropriate throughout the examination. Cardiopulmonary assessment is negative for acute distress and she exhibits normal effort. Bilateral breast exam is deferred.    ECOG = 0  0 - Asymptomatic (Fully active, able to carry on all predisease activities without restriction)  1  - Symptomatic but completely ambulatory (Restricted in physically strenuous activity but ambulatory and able to carry out work of a light or sedentary nature. For example, light housework, office work)  2 - Symptomatic, <50% in bed during the day (Ambulatory and capable of all self care but unable to carry out any work activities. Up and about more than 50% of waking hours)  3 - Symptomatic, >50% in bed, but not bedbound (Capable of only limited self-care, confined to bed or chair 50% or more of waking hours)  4 - Bedbound (Completely disabled. Cannot carry on any self-care. Totally confined to bed or chair)  5 - Death   Santiago Glad MM, Creech RH, Tormey DC, et al. [phone]). "Toxicity and response criteria of the [facility]". Am. Evlyn Clines. Oncol. 5 (6): 649-55    LABORATORY DATA:  Lab Results  Component Value Date   WBC 8.5 04/18/2022   HGB 9.3 (L) 04/18/2022   HCT 28.9 (L) 04/18/2022   MCV 97.0 04/18/2022   PLT 129 (L) 04/18/2022   Lab Results  Component Value Date   NA 136 04/17/2022   K 4.2 04/17/2022   CL 96 (L) 04/17/2022   CO2 28 04/17/2022   Lab Results  Component Value Date   ALT 22 03/18/2021   AST 23 03/18/2021   ALKPHOS 241 (H) 03/18/2021   BILITOT 0.5 03/18/2021      RADIOGRAPHY: [place] RT BREAST BX W LOC DEV 1ST LESION IMG BX SPEC US GUIDE  Addendum Date: 04/15/2023   ADDENDUM REPORT: 04/15/2023 14:34 ADDENDUM: PATHOLOGY revealed: Site Breast, Right, needle core biopsy, 6 o'clock - INVASIVE DUCTAL CARCINOMA - OVERALL GRADE: 1 - LYMPHOVASCULAR INVASION: NOT IDENTIFIED - CALCIFICATIONS: NOT IDENTIFIED Pathology results are CONCORDANT with imaging findings, per Dr. Bary Richard. Pathology results and recommendations below were discussed with patient by telephone on 04/07/2023. Patient reported biopsy site within normal limits with slight tenderness at the site. Post biopsy care instructions were reviewed, questions were answered and my direct phone number  was provided to patient. Patient was instructed to call [facility] if any concerns or questions arise related to the biopsy. RECOMMENDATION: Surgical consultation has been arranged for patient to see Dr. Carolynne Edouard at [facility] on 04/17/2023. Pathology results reported by Lynett Grimes, RN on 04/15/2023. Electronically Signed   By: Bary Richard M.D.   On: 04/15/2023 14:34   Addendum Date: 04/14/2023   ADDENDUM REPORT: 04/14/2023 07:49 ADDENDUM: PATHOLOGY revealed: Breast, RIGHT, needle core biopsy, 6 o'clock - INVASIVE DUCTAL CARCINOMA - OVERALL GRADE: 1 - LYMPHOVASCULAR INVASION: NOT IDENTIFIED - CALCIFICATIONS: NOT IDENTIFIED Pathology results are CONCORDANT with imaging findings, per Dr. Bary Richard. Pathology results and recommendations were discussed with patient via telephone on 04/07/2023. Patient reported biopsy site doing well with no adverse symptoms, and only slight tenderness at the site. Post biopsy care instructions were reviewed, questions were answered and my direct phone number was provided. Patient was instructed to call [facility] for any additional questions or concerns related to biopsy site. RECOMMENDATION: Surgical consultation has been arranged for patient to see Dr. Chevis Pretty at [facility] on Thursday, Apr 17, 2023 at 4:00pm. Patient is aware of this appointment and the location. Pathology results reported by Donell Sievert, RN on 04/07/2023. Electronically Signed   By: Bary Richard M.D.   On: 04/14/2023 07:49   Result Date: 04/15/2023 CLINICAL DATA:  Patient presents today for ultrasound-guided biopsy of a RIGHT breast mass at the 6 o'clock axis. EXAM: ULTRASOUND GUIDED RIGHT BREAST CORE NEEDLE BIOPSY COMPARISON:  Previous exam(s). PROCEDURE: I met with the patient and we discussed the procedure of ultrasound-guided biopsy, including benefits and alternatives. We discussed the high likelihood of a successful procedure. We  discussed the risks of the procedure, including infection, bleeding, tissue injury, clip migration, and inadequate sampling. Informed written consent was given. The usual time-out protocol was performed immediately prior to the procedure. Lesion quadrant: 6 o'clock Using sterile technique and 1% Lidocaine as local anesthetic, under direct ultrasound visualization, a 12 gauge spring-loaded device was used to perform biopsy of the RIGHT breast mass at the 6 o'clock axis, 1 cm from the nipple, using a lateral approach. At the conclusion of the procedure , a coil shaped tissue marker clip was deployed into the biopsy cavity. Follow up 2 view mammogram was performed and dictated separately. IMPRESSION: Ultrasound guided biopsy of the RIGHT breast mass at the 6 o'clock axis. No apparent complications. Electronically Signed: By: Bary Richard M.D. On: 04/04/2023 14:38   MM CLIP PLACEMENT RIGHT  Result Date: 04/04/2023 CLINICAL DATA:  Status post ultrasound-guided biopsy for a RIGHT breast mass at the 6 o'clock axis. EXAM: 3D DIAGNOSTIC RIGHT MAMMOGRAM POST ULTRASOUND BIOPSY COMPARISON:  Previous exam(s). FINDINGS: 3D Mammographic images were obtained following ultrasound guided biopsy of in the RIGHT breast at the 6 o'clock axis. The biopsy marking clip is in expected position at the site of biopsy. IMPRESSION: Appropriate positioning of the coil shaped biopsy marking clip at the site of biopsy in the lower RIGHT breast corresponding to the targeted mass at the 6 o'clock axis. Final Assessment: Post Procedure Mammograms for Marker Placement Electronically Signed   By: Bary Richard M.D.   On: 04/04/2023 14:54      IMPRESSION/PLAN: 1. Stage IA, [id number], grade 1, ER positive invasive ductal carcinoma of the right breast. Dr. Mitzi Hansen discusses the pathology findings and reviews the nature of right breast disease. The consensus from the breast conference includes breast conservation with lumpectomy.  Depending on the  size of the final  tumor measurements rendered by pathology, the tumor may be tested for Oncotype Dx score to determine a role for systemic therapy. Provided that chemotherapy is not indicated, the patient's course would then be followed by external radiotherapy to the breast  to reduce risks of local recurrence followed by antiestrogen therapy. We discussed the risks, benefits, short, and long term effects of radiotherapy, as well as the curative intent, and the patient is interested in proceeding. Dr. Mitzi Hansen discusses the delivery and logistics of radiotherapy and anticipates a course of 4 weeks of radiotherapy to the right breast. We will see her back a few weeks after surgery to discuss the simulation process and anticipate we starting radiotherapy about 4-6 weeks after surgery.  2. End Stage Renal Disease on Hemodialysis. The patient is cared for by Dr. Marisue Humble and his team at [facility], and receives her dialysis on M/W/F in the early morning. We will try to coordinate radiotherapy around her other appointments.    In a visit lasting 60 minutes, greater than 50% of the time was spent face to face reviewing her case, as well as in preparation of, discussing, and coordinating the patient's care.  The above documentation reflects my direct findings during this shared patient visit. Please see the separate note by Dr. Mitzi Hansen on this date for the remainder of the patient's plan of care.    Osker Mason, [facility]    **Disclaimer: This note was dictated with voice recognition software. Similar sounding words can inadvertently be transcribed and this note may contain transcription errors which may not have been corrected upon publication of note.**

## 2023-04-25 ENCOUNTER — Inpatient Hospital Stay: Payer: Medicare Other | Attending: Licensed Clinical Social Worker | Admitting: Licensed Clinical Social Worker

## 2023-04-25 DIAGNOSIS — Z992 Dependence on renal dialysis: Secondary | ICD-10-CM | POA: Diagnosis not present

## 2023-04-25 DIAGNOSIS — N2581 Secondary hyperparathyroidism of renal origin: Secondary | ICD-10-CM | POA: Diagnosis not present

## 2023-04-25 DIAGNOSIS — D631 Anemia in chronic kidney disease: Secondary | ICD-10-CM | POA: Diagnosis not present

## 2023-04-25 DIAGNOSIS — N186 End stage renal disease: Secondary | ICD-10-CM | POA: Diagnosis not present

## 2023-04-25 NOTE — Progress Notes (Signed)
[facility] Clinical Social Work  Initial Assessment   Brenda Arnold is a 67 y.o. year old female contacted by phone. Clinical Social Work was referred by medical provider for assessment of psychosocial needs.   SDOH (Social Determinants of Health) assessments performed: Yes SDOH Interventions    Flowsheet Row Clinical Support from 04/25/2023 in [facility]  SDOH Interventions   Housing Interventions Intervention Not Indicated       SDOH Screenings   Food Insecurity: No Food Insecurity (04/24/2023)  Housing: Low Risk  (04/25/2023)  Recent Concern: Housing - Medium Risk (04/24/2023)  Transportation Needs: No Transportation Needs (04/24/2023)  Utilities: Not At Risk (04/24/2023)  Depression (PHQ2-9): Low Risk  (04/24/2023)  Tobacco Use: Low Risk  (04/24/2023)     Distress Screen completed: No     No data to display            Family/Social Information:  Housing Arrangement: patient lives alone Family members/support persons in your life? Friends Transportation concerns: no  Employment: Working full time for [employer] part B. She has flexibility in what hours she signs on (remote work) to accommodate her dialysis treatments and how she is feeling.  Income source: Employment Financial concerns:  Not currently Type of concern: None Food access concerns: no Religious or spiritual practice: Not known Services Currently in place:  Medicare & MedPay  Coping/ Adjustment to diagnosis: Patient understands treatment plan and what happens next? yes, still "wrapping her head around" the cancer diagnosis, but is feeling a little better after getting more information from radonc team yesterday Patient reported stressors: Adjusting to my illness Patient enjoys watching TV, time with family/ friends, and word searches Current coping skills/ strengths: Ability for insight , Communication skills , Special hobby/interest , and Supportive family/friends      SUMMARY: Current SDOH Barriers:  No major barriers noted today  Clinical Social Work Clinical Goal(s):  No clinical social work goals at this time  Interventions: Discussed common feeling and emotions when being diagnosed with cancer, and the importance of support during treatment Informed patient of the support team roles and support services at [facility] Provided CSW contact information and encouraged patient to call with any questions or concerns   Follow Up Plan: Patient will contact CSW with any support or resource needs Patient verbalizes understanding of plan: Yes    Izayah Miner E Nathaniel Yaden, LCSW Clinical Social Worker [facility]

## 2023-04-28 DIAGNOSIS — N2581 Secondary hyperparathyroidism of renal origin: Secondary | ICD-10-CM | POA: Diagnosis not present

## 2023-04-28 DIAGNOSIS — N186 End stage renal disease: Secondary | ICD-10-CM | POA: Diagnosis not present

## 2023-04-28 DIAGNOSIS — Z992 Dependence on renal dialysis: Secondary | ICD-10-CM | POA: Diagnosis not present

## 2023-04-28 DIAGNOSIS — D631 Anemia in chronic kidney disease: Secondary | ICD-10-CM | POA: Diagnosis not present

## 2023-04-30 ENCOUNTER — Other Ambulatory Visit: Payer: Self-pay | Admitting: General Surgery

## 2023-04-30 DIAGNOSIS — D631 Anemia in chronic kidney disease: Secondary | ICD-10-CM | POA: Diagnosis not present

## 2023-04-30 DIAGNOSIS — Z170 Estrogen receptor positive status [ER+]: Secondary | ICD-10-CM

## 2023-04-30 DIAGNOSIS — N2581 Secondary hyperparathyroidism of renal origin: Secondary | ICD-10-CM | POA: Diagnosis not present

## 2023-04-30 DIAGNOSIS — C50511 Malignant neoplasm of lower-outer quadrant of right female breast: Secondary | ICD-10-CM

## 2023-04-30 DIAGNOSIS — Z992 Dependence on renal dialysis: Secondary | ICD-10-CM | POA: Diagnosis not present

## 2023-04-30 DIAGNOSIS — N186 End stage renal disease: Secondary | ICD-10-CM | POA: Diagnosis not present

## 2023-05-01 NOTE — Pre-Procedure Instructions (Signed)
Surgical Instructions    Your procedure is scheduled on Thursday, June 6th.  Report to [facility] Main Entrance "A" at 10:00 A.M., then check in with the Admitting office.  Call this number if you have problems the morning of surgery:  [phone]  If you have any questions prior to your surgery date call [phone]: Open Monday-Friday 8am-4pm If you experience any cold or flu symptoms such as cough, fever, chills, shortness of breath, etc. between now and your scheduled surgery, please notify us at the above number.     Remember:  Do not eat after midnight the night before your surgery  You may drink clear liquids until 09:00 AM the morning of your surgery.   Clear liquids allowed are: Water, Non-Citrus Juices (without pulp), Carbonated Beverages, Clear Tea, Black Coffee Only (NO MILK, CREAM OR POWDERED CREAMER of any kind), and Gatorade.    Take these medicines the morning of surgery with A SIP OF WATER  amiodarone (PACERONE)  atorvastatin (LIPITOR)  midodrine (PROAMATINE)   If needed: acetaminophen (TYLENOL)  fluticasone (FLONASE)  loratadine (CLARITIN)    Hold Eliquis 3 days prior to procedure. Last dose 6/2.  As of today, STOP taking any Aspirin (unless otherwise instructed by your surgeon) Aleve, Naproxen, Ibuprofen, Motrin, Advil, Goody's, BC's, all herbal medications, fish oil, and all vitamins.                     Do NOT Smoke (Tobacco/Vaping) for 24 hours prior to your procedure.  If you use a CPAP at night, you may bring your mask/headgear for your overnight stay.   Contacts, glasses, piercing's, hearing aid's, dentures or partials may not be worn into surgery, please bring cases for these belongings.    For patients admitted to the hospital, discharge time will be determined by your treatment team.   Patients discharged the day of surgery will not be allowed to drive home, and someone needs to stay with them for 24 hours.  SURGICAL WAITING ROOM  VISITATION Patients having surgery or a procedure may have no more than 2 support people in the waiting area - these visitors may rotate.   Children under the age of 80 must have an adult with them who is not the patient. If the patient needs to stay at the hospital during part of their recovery, the visitor guidelines for inpatient rooms apply. Pre-op nurse will coordinate an appropriate time for 1 support person to accompany patient in pre-op.  This support person may not rotate.   Please refer to the [facility] website for the visitor guidelines for Inpatients (after your surgery is over and you are in a regular room).    Special instructions:   [place]- Preparing For Surgery  Before surgery, you can play an important role. Because skin is not sterile, your skin needs to be as free of germs as possible. You can reduce the number of germs on your skin by washing with CHG (chlorahexidine gluconate) Soap before surgery.  CHG is an antiseptic cleaner which kills germs and bonds with the skin to continue killing germs even after washing.    Oral Hygiene is also important to reduce your risk of infection.  Remember - BRUSH YOUR TEETH THE MORNING OF SURGERY WITH YOUR REGULAR TOOTHPASTE  Please do not use if you have an allergy to CHG or antibacterial soaps. If your skin becomes reddened/irritated stop using the CHG.  Do not shave (including legs and underarms) for at least 48  hours prior to first CHG shower. It is OK to shave your face.  Please follow these instructions carefully.   Shower the NIGHT BEFORE SURGERY and the MORNING OF SURGERY  If you chose to wash your hair, wash your hair first as usual with your normal shampoo.  After you shampoo, rinse your hair and body thoroughly to remove the shampoo.  Use CHG Soap as you would any other liquid soap. You can apply CHG directly to the skin and wash gently with a scrungie or a clean washcloth.   Apply the CHG Soap to your body ONLY  FROM THE NECK DOWN.  Do not use on open wounds or open sores. Avoid contact with your eyes, ears, mouth and genitals (private parts). Wash Face and genitals (private parts)  with your normal soap.   Wash thoroughly, paying special attention to the area where your surgery will be performed.  Thoroughly rinse your body with warm water from the neck down.  DO NOT shower/wash with your normal soap after using and rinsing off the CHG Soap.  Pat yourself dry with a CLEAN TOWEL.  Wear CLEAN PAJAMAS to bed the night before surgery  Place CLEAN SHEETS on your bed the night before your surgery  DO NOT SLEEP WITH PETS.   Day of Surgery: Take a shower with CHG soap. Do not wear jewelry or makeup Do not wear lotions, powders, perfumes, or deodorant. Do not shave 48 hours prior to surgery.  Do not bring valuables to the hospital. [facility] is not responsible for any belongings or valuables. Do not wear nail polish, gel polish, artificial nails, or any other type of covering on natural nails (fingers and toes) If you have artificial nails or gel coating that need to be removed by a nail salon, please have this removed prior to surgery. Artificial nails or gel coating may interfere with anesthesia's ability to adequately monitor your vital signs. Wear Clean/Comfortable clothing the morning of surgery Remember to brush your teeth WITH YOUR REGULAR TOOTHPASTE.   Please read over the following fact sheets that you were given.    If you received a COVID test during your pre-op visit  it is requested that you wear a mask when out in public, stay away from anyone that may not be feeling well and notify your surgeon if you develop symptoms. If you have been in contact with anyone that has tested positive in the last 10 days please notify you surgeon.

## 2023-05-02 ENCOUNTER — Other Ambulatory Visit: Payer: Self-pay

## 2023-05-02 ENCOUNTER — Encounter (HOSPITAL_COMMUNITY)
Admission: RE | Admit: 2023-05-02 | Discharge: 2023-05-02 | Disposition: A | Discharge status: Home / Self Care | Payer: Medicare Other | Source: Ambulatory Visit | Attending: General Surgery | Admitting: General Surgery

## 2023-05-02 ENCOUNTER — Encounter (HOSPITAL_COMMUNITY): Payer: Self-pay

## 2023-05-02 VITALS — BP 88/56 | HR 96 | Temp 98.3°F | Resp 17 | Ht 67.0 in | Wt 240.8 lb

## 2023-05-02 DIAGNOSIS — Z01818 Encounter for other preprocedural examination: Secondary | ICD-10-CM

## 2023-05-02 DIAGNOSIS — Z01812 Encounter for preprocedural laboratory examination: Secondary | ICD-10-CM | POA: Diagnosis not present

## 2023-05-02 DIAGNOSIS — N186 End stage renal disease: Secondary | ICD-10-CM | POA: Insufficient documentation

## 2023-05-02 DIAGNOSIS — E119 Type 2 diabetes mellitus without complications: Secondary | ICD-10-CM | POA: Diagnosis not present

## 2023-05-02 DIAGNOSIS — N2581 Secondary hyperparathyroidism of renal origin: Secondary | ICD-10-CM | POA: Diagnosis not present

## 2023-05-02 DIAGNOSIS — D631 Anemia in chronic kidney disease: Secondary | ICD-10-CM | POA: Diagnosis not present

## 2023-05-02 DIAGNOSIS — Z992 Dependence on renal dialysis: Secondary | ICD-10-CM | POA: Diagnosis not present

## 2023-05-02 HISTORY — DX: Hypotension, unspecified: I95.9

## 2023-05-02 LAB — BASIC METABOLIC PANEL
Anion gap: 11 (ref 5–15)
BUN: 13 mg/dL (ref 8–23)
CO2: 32 mmol/L (ref 22–32)
Calcium: 7.6 mg/dL — ABNORMAL LOW (ref 8.9–10.3)
Chloride: 91 mmol/L — ABNORMAL LOW (ref 98–111)
Creatinine, Ser: 5.1 mg/dL — ABNORMAL HIGH (ref 0.44–1.00)
GFR, Estimated: 9 mL/min — ABNORMAL LOW (ref >60)
Glucose, Bld: 78 mg/dL (ref 70–99)
Potassium: 2.9 mmol/L — ABNORMAL LOW (ref 3.5–5.1)
Sodium: 134 mmol/L — ABNORMAL LOW (ref 135–145)

## 2023-05-02 LAB — CBC
HCT: 33.6 % — ABNORMAL LOW (ref 36.0–46.0)
Hemoglobin: 10.7 g/dL — ABNORMAL LOW (ref 12.0–15.0)
MCH: 31.1 pg (ref 26.0–34.0)
MCHC: 31.8 g/dL (ref 30.0–36.0)
MCV: 97.7 fL (ref 80.0–100.0)
Platelets: 152 10*3/uL (ref 150–400)
RBC: 3.44 MIL/uL — ABNORMAL LOW (ref 3.87–5.11)
RDW: 14.9 % (ref 11.5–15.5)
WBC: 8.5 10*3/uL (ref 4.0–10.5)
nRBC: 0 % (ref 0.0–0.2)

## 2023-05-02 LAB — GLUCOSE, CAPILLARY: Glucose-Capillary: 75 mg/dL (ref 70–99)

## 2023-05-02 NOTE — Progress Notes (Signed)
PCP - Dr. Ardean Larsen Cardiologist - Dr. Armanda Magic  PPM/ICD - denies   Chest x-ray - 11/08/21 EKG - 09/27/22 Stress Test - 09/20/21 ECHO - 03/16/21 Cardiac Cath - denies  Sleep Study - OSA+ CPAP - denies, unable to tolerate  DM- pt does not check CBG at home and she does not know what her typical fasting levels are  Last dose of GLP1 agonist-  n/a   Blood Thinner Instructions: Hold Eliquis 3 days. Last dose 6/2 Aspirin Instructions: n/a  ERAS Protcol - yes, no drink   COVID TEST- n/a   Anesthesia review: yes, cardiac hx, seed placement, HD pt (MWF)  Patient denies shortness of breath, fever, cough and chest pain at PAT appointment   All instructions explained to the patient, with a verbal understanding of the material. Patient agrees to go over the instructions while at home for a better understanding. The opportunity to ask questions was provided.  Pt expressed that she does not have anyone to drive her and be with her for 24 hours. Dr. Billey Chang office notified. They said they will let Dr. Carolynne Edouard know and see if her status can be changed to "surgical admission."

## 2023-05-03 DIAGNOSIS — N186 End stage renal disease: Secondary | ICD-10-CM | POA: Diagnosis not present

## 2023-05-03 DIAGNOSIS — Z992 Dependence on renal dialysis: Secondary | ICD-10-CM | POA: Diagnosis not present

## 2023-05-03 DIAGNOSIS — E1122 Type 2 diabetes mellitus with diabetic chronic kidney disease: Secondary | ICD-10-CM | POA: Diagnosis not present

## 2023-05-03 LAB — HEMOGLOBIN A1C
Hgb A1c MFr Bld: 5.2 % (ref 4.8–5.6)
Mean Plasma Glucose: 103 mg/dL

## 2023-05-05 DIAGNOSIS — N186 End stage renal disease: Secondary | ICD-10-CM | POA: Diagnosis not present

## 2023-05-05 DIAGNOSIS — Z992 Dependence on renal dialysis: Secondary | ICD-10-CM | POA: Diagnosis not present

## 2023-05-05 DIAGNOSIS — N2581 Secondary hyperparathyroidism of renal origin: Secondary | ICD-10-CM | POA: Diagnosis not present

## 2023-05-05 DIAGNOSIS — D631 Anemia in chronic kidney disease: Secondary | ICD-10-CM | POA: Diagnosis not present

## 2023-05-05 NOTE — Progress Notes (Signed)
Anesthesia Chart Review:  Follows with cardiology for history of paroxysmal A-fib on Eliquis and amiodarone, cardiac tamponade s/p pericardial window 2020, history of provoked DVT 2019, HTN, CAD (negative nuclear stress 09/2021).  Last seen by Robin Searing, NP 09/27/2022, stable at that time, no changes made to management, 1 year follow-up recommended.  Cleared by cardiology to hold Eliquis for 3 days prior to procedure per telephone encounter 04/21/2023 by Bernadene Person, NP.  Reports last dose Eliquis 05/04/2023.  OSA, intolerant to CPAP.  ESRD on HD Monday Wednesday Friday.  Preop labs reviewed, mild anemia hemoglobin 10.7, elevated creatinine 5.10 consistent with ESRD, hypokalemia potassium 2.9.  Patient will need day of surgery i-STAT.  Nuclear stress 09/20/2021:   Findings are consistent with no prior ischemia and no prior myocardial infarction. The study is low risk.   No ST deviation was noted.   LV perfusion is normal.   Left ventricular function is normal. Nuclear stress EF: 83 %. The left ventricular ejection fraction is hyperdynamic (>65%). End diastolic cavity size is normal. End systolic cavity size is normal.   Prior study done without evidence of ischemia or infarction.   Hyperkinetic LV. Diaphragmatic attenuation noted. Study quality is fair.   Negative for ischemia or infarction, decreased sensitivity in the setting of artifact.  TTE 03/16/2021:  1. Left ventricular ejection fraction, by estimation, is 70 to 75%. The  left ventricle has hyperdynamic function. The left ventricle has no  regional wall motion abnormalities. There is moderate concentric left  ventricular hypertrophy. Left ventricular  diastolic parameters were normal.   2. Right ventricular systolic function is normal. The right ventricular  size is normal. There is normal pulmonary artery systolic pressure.   3. The mitral valve is grossly normal. Trivial mitral valve  regurgitation. No evidence of mitral  stenosis.   4. The aortic valve is tricuspid. There is moderate calcification of the  aortic valve. There is mild thickening of the aortic valve. Aortic valve  regurgitation is not visualized. Mild to moderate aortic valve  sclerosis/calcification is present, without  any evidence of aortic stenosis.   5. The inferior vena cava is normal in size with <50% respiratory  variability, suggesting right atrial pressure of 8 mmHg.   Comparison(s): No significant change from prior study.   Conclusion(s)/Recommendation(s): Otherwise normal echocardiogram, with  minor abnormalities described in the report.     Zannie Cove [facility] Stay [facility]/Anesthesiology Phone [phone] 05/05/2023 1:32 PM

## 2023-05-05 NOTE — Anesthesia Preprocedure Evaluation (Signed)
Anesthesia Evaluation  Patient identified by MRN, date of birth, ID band Patient awake    Reviewed: Allergy & Precautions, NPO status , Patient's Chart, lab work & pertinent test results  History of Anesthesia Complications (+) history of anesthetic complications (reports difficulty getting oxygen saturation up, neuropathy)  Airway Mallampati: III  TM Distance: >3 FB Neck ROM: Full   Comment: Previous grade I view with MAC 3, easy mask Dental  (+) Dental Advisory Given   Pulmonary neg shortness of breath, asthma , sleep apnea , neg COPD, neg recent URI S/p VATS wedge resection   Pulmonary exam normal breath sounds clear to auscultation       Cardiovascular hypertension, (-) angina + CAD, +CHF (diastolic) and + DVT (2019)  (-) Cardiac Stents and (-) CABG + dysrhythmias (on amiodarone and Eliquis) Atrial Fibrillation  Rhythm:Regular Rate:Normal  H/o pericarditis 08/2019, HLD  Stress Test 09/20/2021:   Findings are consistent with no prior ischemia and no prior myocardial infarction. The study is low risk.   No ST deviation was noted.   LV perfusion is normal.   Left ventricular function is normal. Nuclear stress EF: 83 %. The left ventricular ejection fraction is hyperdynamic (>65%). End diastolic cavity size is normal. End systolic cavity size is normal.   Prior study done without evidence of ischemia or infarction.   Hyperkinetic LV. Diaphragmatic attenuation noted. Study quality is fair.   Negative for ischemia or infarction, decreased sensitivity in the setting of artifact.  TTE 03/16/2021: IMPRESSIONS     1. Left ventricular ejection fraction, by estimation, is 70 to 75%. The  left ventricle has hyperdynamic function. The left ventricle has no  regional wall motion abnormalities. There is moderate concentric left  ventricular hypertrophy. Left ventricular  diastolic parameters were normal.   2. Right ventricular  systolic function is normal. The right ventricular  size is normal. There is normal pulmonary artery systolic pressure.   3. The mitral valve is grossly normal. Trivial mitral valve  regurgitation. No evidence of mitral stenosis.   4. The aortic valve is tricuspid. There is moderate calcification of the  aortic valve. There is mild thickening of the aortic valve. Aortic valve  regurgitation is not visualized. Mild to moderate aortic valve  sclerosis/calcification is present, without  any evidence of aortic stenosis.   5. The inferior vena cava is normal in size with <50% respiratory  variability, suggesting right atrial pressure of 8 mmHg.      Neuro/Psych  Headaches, neg Seizures    GI/Hepatic negative GI ROS, Neg liver ROS,,,  Endo/Other  diabetes, Type 2  hyperparathyroidism  Renal/GU ESRF and DialysisRenal disease (last HD yesterday)     Musculoskeletal  (+) Arthritis ,    Abdominal  (+) + obese  Peds  Hematology  (+) Blood dyscrasia, anemia   Anesthesia Other Findings K 3.7  Patient requests Benadryl intra-op for neuropathy. She takes Benadryl at home sometimes with no issues.  FSBG 63. Recheck on iSTAT 71. Patient takes no diabetes medications. She does not check her blood sugar at home. She is not symptomatic. Will recheck again later.  Reproductive/Obstetrics PCOS                             Anesthesia Physical Anesthesia Plan  ASA: 3  Anesthesia Plan: General   Post-op Pain Management: Tylenol PO (pre-op)*   Induction: Intravenous  PONV Risk Score and Plan: 3 and Ondansetron, Dexamethasone and  Treatment may vary due to age or medical condition  Airway Management Planned: LMA  Additional Equipment:   Intra-op Plan:   Post-operative Plan: Extubation in OR  Informed Consent: I have reviewed the patients History and Physical, chart, labs and discussed the procedure including the risks, benefits and alternatives for the  proposed anesthesia with the patient or authorized representative who has indicated his/her understanding and acceptance.     Dental advisory given  Plan Discussed with: Anesthesiologist and CRNA  Anesthesia Plan Comments: (PAT note by Antionette Poles, PA-C:  Follows with cardiology for history of paroxysmal A-fib on Eliquis and amiodarone, cardiac tamponade s/p pericardial window 2020, history of provoked DVT 2019, HTN, CAD (negative nuclear stress 09/2021).  Last seen by Robin Searing, NP 09/27/2022, stable at that time, no changes made to management, 1 year follow-up recommended.  Cleared by cardiology to hold Eliquis for 3 days prior to procedure per telephone encounter 04/21/2023 by Bernadene Person, NP.  Reports last dose Eliquis 05/04/2023.  OSA, intolerant to CPAP.  ESRD on HD Monday Wednesday Friday.  Preop labs reviewed, mild anemia hemoglobin 10.7, elevated creatinine 5.10 consistent with ESRD, hypokalemia potassium 2.9.  Patient will need day of surgery i-STAT.  Nuclear stress 09/20/2021:   Findings are consistent with no prior ischemia and no prior myocardial infarction. The study is low risk.   No ST deviation was noted.   LV perfusion is normal.   Left ventricular function is normal. Nuclear stress EF: 83 %. The left ventricular ejection fraction is hyperdynamic (>65%). End diastolic cavity size is normal. End systolic cavity size is normal.   Prior study done without evidence of ischemia or infarction.  Hyperkinetic LV. Diaphragmatic attenuation noted. Study quality is fair.  Negative for ischemia or infarction, decreased sensitivity in the setting of artifact.  TTE 03/16/2021: 1. Left ventricular ejection fraction, by estimation, is 70 to 75%. The  left ventricle has hyperdynamic function. The left ventricle has no  regional wall motion abnormalities. There is moderate concentric left  ventricular hypertrophy. Left ventricular  diastolic parameters were normal.  2.  Right ventricular systolic function is normal. The right ventricular  size is normal. There is normal pulmonary artery systolic pressure.  3. The mitral valve is grossly normal. Trivial mitral valve  regurgitation. No evidence of mitral stenosis.  4. The aortic valve is tricuspid. There is moderate calcification of the  aortic valve. There is mild thickening of the aortic valve. Aortic valve  regurgitation is not visualized. Mild to moderate aortic valve  sclerosis/calcification is present, without  any evidence of aortic stenosis.  5. The inferior vena cava is normal in size with <50% respiratory  variability, suggesting right atrial pressure of 8 mmHg.   Comparison(s): No significant change from prior study.   Conclusion(s)/Recommendation(s): Otherwise normal echocardiogram, with  minor abnormalities described in the report.   Risks of general anesthesia discussed including, but not limited to, sore throat, hoarse voice, chipped/damaged teeth, injury to vocal cords, nausea and vomiting, allergic reactions, lung infection, heart attack, stroke, and death. All questions answered.  )        Anesthesia Quick Evaluation

## 2023-05-06 ENCOUNTER — Other Ambulatory Visit: Payer: Self-pay

## 2023-05-06 ENCOUNTER — Other Ambulatory Visit: Payer: Self-pay | Admitting: General Surgery

## 2023-05-06 ENCOUNTER — Encounter: Payer: Self-pay | Admitting: Hematology and Oncology

## 2023-05-06 ENCOUNTER — Ambulatory Visit
Admission: RE | Admit: 2023-05-06 | Discharge: 2023-05-06 | Disposition: A | Discharge status: Home / Self Care | Payer: Medicare Other | Source: Ambulatory Visit | Attending: General Surgery | Admitting: General Surgery

## 2023-05-06 ENCOUNTER — Inpatient Hospital Stay: Payer: Medicare Other | Attending: Hematology and Oncology | Admitting: Hematology and Oncology

## 2023-05-06 ENCOUNTER — Inpatient Hospital Stay: Payer: Medicare Other

## 2023-05-06 VITALS — BP 120/57 | HR 80 | Temp 98.1°F | Resp 18 | Ht 67.0 in | Wt 243.6 lb

## 2023-05-06 DIAGNOSIS — C50811 Malignant neoplasm of overlapping sites of right female breast: Secondary | ICD-10-CM

## 2023-05-06 DIAGNOSIS — D0511 Intraductal carcinoma in situ of right breast: Secondary | ICD-10-CM | POA: Diagnosis not present

## 2023-05-06 DIAGNOSIS — Z992 Dependence on renal dialysis: Secondary | ICD-10-CM | POA: Diagnosis not present

## 2023-05-06 DIAGNOSIS — C50511 Malignant neoplasm of lower-outer quadrant of right female breast: Secondary | ICD-10-CM

## 2023-05-06 DIAGNOSIS — Z86718 Personal history of other venous thrombosis and embolism: Secondary | ICD-10-CM | POA: Insufficient documentation

## 2023-05-06 DIAGNOSIS — Z170 Estrogen receptor positive status [ER+]: Secondary | ICD-10-CM | POA: Insufficient documentation

## 2023-05-06 DIAGNOSIS — Z7901 Long term (current) use of anticoagulants: Secondary | ICD-10-CM | POA: Insufficient documentation

## 2023-05-06 DIAGNOSIS — E1122 Type 2 diabetes mellitus with diabetic chronic kidney disease: Secondary | ICD-10-CM | POA: Diagnosis not present

## 2023-05-06 DIAGNOSIS — Z510 Encounter for antineoplastic radiation therapy: Secondary | ICD-10-CM | POA: Diagnosis not present

## 2023-05-06 HISTORY — PX: BREAST BIOPSY: SHX20

## 2023-05-06 LAB — RESEARCH LABS

## 2023-05-06 NOTE — Research (Signed)
[employer] 2021-05 - Specimen Collection Study to Evaluate Biomarkers in Subjects with Cancer   [name] confirmed pt did not have anesthesia this morning for clip placement.  This Nurse has reviewed this patient's inclusion and exclusion criteria as a second review and confirms Brenda Arnold is eligible for study participation.  Patient may continue with enrollment.  [name], RN, BSN, [facility]  Her  Hers Clinical Research Nurse [facility] Direct Dial [phone]  Pager [phone] 05/06/2023 11:20 AM

## 2023-05-06 NOTE — Assessment & Plan Note (Signed)
This is a very pleasant 67 year old postmenopausal female patient with past medical history significant for hypertension, end-stage renal disease, A-fib on anticoagulation, history of DVT, diabetes mellitus referred to medical oncology for new diagnosis of right breast invasive ductal carcinoma.  Given small size, node-negative and strong ER staining, she will proceed with upfront surgery followed by Oncotype Dx testing.  We have discussed the following details about Oncotype.  We have discussed about Oncotype Dx score which is a well validated prognostic scoring system which can predict outcome with endocrine therapy alone and whether chemotherapy reduces recurrence.  Typically in patients with ER positive cancers that are node negative if the RS score is high typically greater than or equal to 26, chemotherapy is recommended.  In women with intermediate recurrence score younger than 50, there can still be some role for chemotherapy in addition to endocrine therapy especially if the recurrence score is between 21-25. If chemotherapy is needed, this will precede radiation and then after radiation she will continue on antiestrogen therapy.  If Oncotype DX less than 26, she will proceed with adjuvant radiation followed by antiestrogen therapy.  I have discussed about the following details regarding antiestrogen therapy.  Given her history of DVT, we will consider aromatase inhibitors for antiestrogen therapy. We have discussed options for antiestrogen therapy today  With regards to aromatase inhibitors, we discussed mechanism of action, adverse effects including but not limited to post menopausal symptoms, arthralgias, myalgias, increased risk of cardiovascular events and bone loss.  She does not have a known history of osteopenia or osteoporosis.  We will do a baseline bone density scan as well.  She will return to clinic at the end of June or early July to review Oncotype DX results as well as any  additional recommendations.  She has kindly agreed to participate in her specimen collection study today.

## 2023-05-06 NOTE — Research (Signed)
[employer] 2021-05 - Specimen Collection Study to Evaluate Biomarkers in Subjects with Cancer   Patient Brenda Arnold was identified by Dr. Al Pimple as a potential candidate for the above listed study.  This Clinical Research Coordinator met with Brenda Arnold, Brenda Arnold, on 05/06/23 in a manner and location that ensures patient privacy to discuss participation in the above listed research study.  Patient is Unaccompanied.  A copy of the informed consent document with embedded HIPAA language was provided to the patient.  Patient reads, speaks, and understands [place].    Patient was provided with the business card of this Coordinator and encouraged to contact the research team with any questions.  Patient was provided the option of taking informed consent documents home to review and was encouraged to review at their convenience with their support network, including other care providers. Patient took the consent documents home to review.  As outlined in the informed consent form, this Coordinator and Gerlene Fee discussed the purpose of the research study, the investigational nature of the study, study procedures and requirements for study participation, potential risks and benefits of study participation, as well as alternatives to participation. This study is not double blinded. The patient understands participation is voluntary and they may withdraw from study participation at any time.  This study does not involve randomization.  This study does not involve an investigational drug or device. This study does not involve a placebo. Patient understands enrollment is pending full eligibility review.   Confidentiality and how the patient's information will be used as part of study participation were discussed.  Patient was informed there is not reimbursement provided for their time and effort spent on trial participation.  The patient is encouraged to discuss research study participation  with their insurance provider to determine what costs they may incur as part of study participation, including research related injury.    All questions were answered to patient's satisfaction.  The informed consent with embedded HIPAA language was reviewed page by page.  The patient's mental and emotional status is appropriate to provide informed consent, and the patient verbalizes an understanding of study participation.  Patient has agreed to participate in the above listed research study and has voluntarily signed the informed consent 3 version with embedded HIPAA language, version 3  on 05/06/23 at 11:00AM.  The patient was provided with a copy of the signed informed consent form with embedded HIPAA language for their reference.  No study specific procedures were obtained prior to the signing of the informed consent document.  Approximately 30 minutes were spent with the patient reviewing the informed consent documents.  Patient was not requested to complete a Release of Information form.   This Coordinator has reviewed this patient's inclusion and exclusion criteria and confirmed Brenda Arnold is eligible for study participation.  Patient will continue with enrollment.  Menopausal status (women only): Brenda Arnold is postmenopausal    Eligibility confirmed by treating investigator, who also agrees that patient should proceed with enrollment.  Medical History:  High Blood Pressure  Yes Coronary Artery Disease No Lupus    No Rheumatoid Arthritis  No Diabetes   Yes      If yes, which type?      2 Lynch Syndrome  No  Is the patient currently taking a magnesium supplement?   No If yes, dose and frequency? N/a Indication? N/a Start date? N/a  Does the patient have a personal history of cancer (greater than 5 years  ago)?  No If yes, Cancer type and date of diagnosis?   N/a  Has this previous diagnosis been treated? N/a      If so, treatment type? N/a   Start and end dates of  last treatment cycle? N/a  Does the patient have a family history of cancer in 1st or 2nd degree relatives? No If yes, Relationship(s) and Cancer type(s)? N/a  Does the patient have history of alcohol consumption? No   If yes, current or former? N/a If former, year stopped? N/a Number of years? N/a Drinks per week? N/a  Does the patient have history of cigarette, cigar, pipe, or chewing tobacco use?  No  If yes, current for former? N/a If yes, type (Cigarette, cigar, pipe, and/or chewing tobacco)? N/a   If former, year stopped? N/a Number of years? N/a Packs/number/containers per day? N/a  Felecia Jan, [facility] 05/06/2023 11:17 AM

## 2023-05-06 NOTE — Progress Notes (Signed)
[facility] CONSULT NOTE  Patient Care Team: [name], MD as PCP - General (Internal Medicine) Quintella Reichert, MD as PCP - Cardiology (Cardiology) Camille Bal, MD (Nephrology)  CHIEF COMPLAINTS/PURPOSE OF CONSULTATION:  Newly diagnosed breast cancer  HISTORY OF PRESENTING ILLNESS:  Brenda Arnold 67 y.o. female is here because of recent diagnosis of right breast cancer  I reviewed her records extensively and collaborated the history with the patient.  SUMMARY OF ONCOLOGIC HISTORY: Oncology History  Malignant neoplasm of overlapping sites of right female breast (HCC)  03/21/2023 Mammogram   Diagnostic mammogram showed suspicious mass in the right breast at 6:00 measuring 1.3 cm, at the palpable site of concern in the left axilla there benign sebaceous or epidermal inclusion cyst.  No mammographic evidence of malignancy in the left breast.   04/04/2023 Pathology Results   Right breast needle core biopsy showed invasive ductal carcinoma overall grade 1, prognostic showed ER 100% positive strong staining PR negative HER2 0 and Ki-67 of 10%   04/24/2023 Initial Diagnosis   Malignant neoplasm of overlapping sites of right female breast (HCC)   04/24/2023 Cancer Staging   Staging form: Breast, AJCC 8th Edition - Clinical stage from 04/24/2023: Stage IA (cT1c, cN0, cM0, G1, ER+, PR-, HER2-) - Signed by Ronny Bacon, PA-C on 04/24/2023 Stage prefix: Initial diagnosis Method of lymph node assessment: Clinical Histologic grading system: 3 grade system      Patient is here for an initial visit.  She walks with a walker, has been dialysis dependent since March 2018, has well-controlled diabetes mellitus and high blood pressure.  She continues to work for [employer].  She feels well today, he is on anticoagulation given A-fib.  She also had a history of DVT after hip surgery.  She denies any family history of breast cancer.  She could not feel this  lump in the right breast before the mammogram.  No other concerning review of systems today.  MEDICAL HISTORY:  Past Medical History:  Diagnosis Date   Agatston coronary artery calcium score greater than 400    Ca score 3824 on CT 08/2021   Anemia    Arthritis    Asthma    Breast cancer (HCC) 04/04/2023   Complication of anesthesia    difficulty with getting oxygen saturation up- "patient was not aware"  Bottom of feet burning in PACU   Constipation    because of Iron   Diabetes mellitus    not on meds   DVT (deep venous thrombosis) (HCC) 2019   post hip replacement - right   Dyslipidemia    Epistaxis 11/05/2012   ESRD (end stage renal disease) on dialysis [facility])    "MWF; Rudene Anda" (09/15/2018)- started 02/13/2017   History of blood transfusion    HTN (hypertension)    pt denies this. She states her "BP runs low now"   Hyperparathyroidism due to renal insufficiency (HCC)    Hypotension    Obesity    s/p panniculectomy   Paroxysmal A-fib (HCC)    a. chronic coumadin;  b. 12/2009 Echo: EF 60-65%, Gr 1 DD.   PCOS (polycystic ovarian syndrome)    Pericarditis 08/2019   pericarditis with pericardial effusion, s/p right VATS/pericardial window 08/31/19   Pneumonia    Seasonal allergies    Sleep apnea    a. not using CPAP, last study  >8 yrs   Vitamin D deficiency     SURGICAL HISTORY: Past Surgical History:  Procedure Laterality Date   ABDOMINAL HYSTERECTOMY     with panniculectomy   AV FISTULA PLACEMENT  09/02/2012   Procedure: ARTERIOVENOUS (AV) FISTULA CREATION;  Surgeon: Sherren Kerns, MD;  Location: [facility] OR;  Service: Vascular;  Laterality: Left;  Creation of Left Radial-Cephalic Fistula    BREAST BIOPSY Right 04/04/2023   [place] RT BREAST BX W LOC DEV 1ST LESION IMG BX SPEC US GUIDE 04/04/2023 [facility] MAMMOGRAPHY   COLONOSCOPY     COLONOSCOPY W/ BIOPSIES AND POLYPECTOMY     DILATION AND CURETTAGE OF UTERUS     KNEE ARTHROSCOPY Right    PANNICULECTOMY     REVISON OF  ARTERIOVENOUS FISTULA Left 04/27/2014   Procedure: REVISON OF LEFT RADIAL-CEPHALIC ARTERIOVENOUS FISTULA;  Surgeon: Pryor Ochoa, MD;  Location: [facility] OR;  Service: Vascular;  Laterality: Left;   REVISON OF ARTERIOVENOUS FISTULA Left 01/13/2020   Procedure: REVISON OF ARTERIOVENOUS FISTULA;  Surgeon: Cephus Shelling, MD;  Location: [facility] OR;  Service: Vascular;  Laterality: Left;   TOTAL HIP ARTHROPLASTY Right 09/15/2018   TOTAL HIP ARTHROPLASTY Right 09/15/2018   Procedure: RIGHT TOTAL HIP ARTHROPLASTY ANTERIOR APPROACH;  Surgeon: Tarry Kos, MD;  Location: [facility] OR;  Service: Orthopedics;  Laterality: Right;   TOTAL HIP ARTHROPLASTY Left 11/23/2020   Procedure: LEFT TOTAL HIP ARTHROPLASTY ANTERIOR APPROACH;  Surgeon: Tarry Kos, MD;  Location: [facility] OR;  Service: Orthopedics;  Laterality: Left;  NEEDS RNFA PLEASE   UVULOPLASTY     VIDEO ASSISTED THORACOSCOPY (VATS)/WEDGE RESECTION Right 08/31/2019   Procedure: VIDEO ASSISTED THORACOSCOPY/ DRAINAGE OF PERICARDIAL EFFUSION/ PERICARDIAL WINDOW/ ABORTED PERICARDIOCENTESIS ;  Surgeon: Corliss Skains, MD;  Location: [facility] OR;  Service: Thoracic;  Laterality: Right;    SOCIAL HISTORY: Social History   Socioeconomic History   Marital status: Single    Spouse name: Not on file   Number of children: 0   Years of education: Not on file   Highest education level: Not on file  Occupational History   Occupation: CORRESPONDENT ADMIN.    Employer: [employer]  Tobacco Use   Smoking status: Never   Smokeless tobacco: Never  Vaping Use   Vaping Use: Never used  Substance and Sexual Activity   Alcohol use: No    Alcohol/week: 0.0 standard drinks of alcohol   Drug use: No   Sexual activity: Not on file    Comment: Hysterectomy  Other Topics Concern   Not on file  Social History Narrative   Lives in [place] alone. 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   Social Determinants of Health   Financial Resource  Strain: Not on file  Food Insecurity: No Food Insecurity (04/24/2023)   Hunger Vital Sign    Worried About Running Out of Food in the Last Year: Never true    Ran Out of Food in the Last Year: Never true  Transportation Needs: No Transportation Needs (04/24/2023)   PRAPARE - Administrator, Civil Service (Medical): No    Lack of Transportation (Non-Medical): No  Physical Activity: Not on file  Stress: Not on file  Social Connections: Not on file  Intimate Partner Violence: Not At Risk (04/24/2023)   Humiliation, Afraid, Rape, and Kick questionnaire    Fear of Current or Ex-Partner: No    Emotionally Abused: No    Physically Abused: No    Sexually Abused: No    FAMILY HISTORY: Family History  Problem Relation Age of Onset   Lung cancer  Father        died @ 66   Hypertension Mother    Diabetes Brother    Kidney disease Brother     ALLERGIES:  is allergic to iodine, shellfish allergy, shellfish-derived products, amlodipine, felodipine, lisinopril, and chlorhexidine.  MEDICATIONS:  Current Outpatient Medications  Medication Sig Dispense Refill   acetaminophen (TYLENOL) 500 MG tablet Take 2 tablets (1,000 mg total) by mouth every 8 (eight) hours as needed for mild pain.     amiodarone (PACERONE) 200 MG tablet Take 1 tablet (200 mg total) by mouth daily. 90 tablet 3   atorvastatin (LIPITOR) 40 MG tablet Take 1 tablet (40 mg total) by mouth daily. 90 tablet 3   B Complex-C-Folic Acid (DIALYVITE TABLET) TABS Take 1 tablet by mouth daily.     bisacodyl (DULCOLAX) 5 MG EC tablet Take 1 tablet (5 mg total) by mouth daily as needed for moderate constipation.     colchicine 0.6 MG tablet TAKE 0.5 TABLETS (0.3 MG TOTAL) BY MOUTH EVERY MONDAY, WEDNESDAY, AND FRIDAY. 45 tablet 3   ELIQUIS 5 MG TABS tablet Take 1 tablet (5 mg total) by mouth 2 (two) times daily. 180 tablet 3   Etelcalcetide HCl (PARSABIV IV) Etelcalcetide (Parsabiv)     ethyl chloride spray Apply 1 application   topically every Monday, Wednesday, and Friday with hemodialysis.     fluticasone (FLONASE) 50 MCG/ACT nasal spray Place 2 sprays into both nostrils daily. (Patient taking differently: Place 2 sprays into both nostrils daily as needed for allergies.) 9.9 mL 0   gabapentin (NEURONTIN) 100 MG capsule Take 100 mg by mouth at bedtime.  3   loratadine (CLARITIN) 10 MG tablet Take 1 tablet (10 mg total) by mouth daily. (Patient taking differently: Take 10 mg by mouth daily as needed for allergies.) 30 tablet 0   methocarbamol (ROBAXIN) 500 MG tablet Take 1 tablet (500 mg total) by mouth every 8 (eight) hours as needed for muscle spasms. (Patient not taking: Reported on 05/01/2023) 30 tablet 0   midodrine (PROAMATINE) 10 MG tablet Take 1 tablet (10 mg total) by mouth 3 (three) times daily with meals.     sucroferric oxyhydroxide (VELPHORO) 500 MG chewable tablet Chew 2 tablets (1,000 mg total) by mouth 3 (three) times daily with meals. 180 tablet 0   No current facility-administered medications for this visit.    REVIEW OF SYSTEMS:   Constitutional: Denies fevers, chills or abnormal night sweats Eyes: Denies blurriness of vision, double vision or watery eyes Ears, nose, mouth, throat, and face: Denies mucositis or sore throat Respiratory: Denies cough, dyspnea or wheezes Cardiovascular: Denies palpitation, chest discomfort or lower extremity swelling Gastrointestinal:  Denies nausea, heartburn or change in bowel habits Skin: Denies abnormal skin rashes Lymphatics: Denies new lymphadenopathy or easy bruising Neurological:Denies numbness, tingling or new weaknesses Behavioral/Psych: Mood is stable, no new changes  Breast: Felt left axilla lumps not the right breast. All other systems were reviewed with the patient and are negative.  PHYSICAL EXAMINATION: ECOG PERFORMANCE STATUS: 1 - Symptomatic but completely ambulatory  Vitals:   05/06/23 1018  BP: (!) 120/57  Pulse: 80  Resp: 18  Temp: 98.1  F (36.7 C)  SpO2: 100%   Filed Weights   05/06/23 1018  Weight: 243 lb 9.6 oz (110.5 kg)    GENERAL:alert, no distress and comfortable Breast: Large pendulous breasts, the breast mass noticed on the mammogram in the right breast at 6:00 is not easily palpable.  No other palpable  masses in both breasts.  I agree that the left axillary lumps that she felt is likely sebaceous cyst.  No other regional adenopathy No lower extremity edema  LABORATORY DATA:  I have reviewed the data as listed Lab Results  Component Value Date   WBC 8.5 05/02/2023   HGB 10.7 (L) 05/02/2023   HCT 33.6 (L) 05/02/2023   MCV 97.7 05/02/2023   PLT 152 05/02/2023   Lab Results  Component Value Date   NA 134 (L) 05/02/2023   K 2.9 (L) 05/02/2023   CL 91 (L) 05/02/2023   CO2 32 05/02/2023    RADIOGRAPHIC STUDIES: I have personally reviewed the radiological reports and agreed with the findings in the report.  ASSESSMENT AND PLAN:  Malignant neoplasm of overlapping sites of right female breast [facility]) This is a very pleasant 67 year old postmenopausal female patient with past medical history significant for hypertension, end-stage renal disease, A-fib on anticoagulation, history of DVT, diabetes mellitus referred to medical oncology for new diagnosis of right breast invasive ductal carcinoma.  Given small size, node-negative and strong ER staining, she will proceed with upfront surgery followed by Oncotype Dx testing.  We have discussed the following details about Oncotype.  We have discussed about Oncotype Dx score which is a well validated prognostic scoring system which can predict outcome with endocrine therapy alone and whether chemotherapy reduces recurrence.  Typically in patients with ER positive cancers that are node negative if the RS score is high typically greater than or equal to 26, chemotherapy is recommended.  In women with intermediate recurrence score younger than 50, there can still be some  role for chemotherapy in addition to endocrine therapy especially if the recurrence score is between 21-25. If chemotherapy is needed, this will precede radiation and then after radiation she will continue on antiestrogen therapy.  If Oncotype DX less than 26, she will proceed with adjuvant radiation followed by antiestrogen therapy.  I have discussed about the following details regarding antiestrogen therapy.  Given her history of DVT, we will consider aromatase inhibitors for antiestrogen therapy. We have discussed options for antiestrogen therapy today  With regards to aromatase inhibitors, we discussed mechanism of action, adverse effects including but not limited to post menopausal symptoms, arthralgias, myalgias, increased risk of cardiovascular events and bone loss.  She does not have a known history of osteopenia or osteoporosis.  We will do a baseline bone density scan as well.  She will return to clinic at the end of June or early July to review Oncotype DX results as well as any additional recommendations.  She has kindly agreed to participate in her specimen collection study today.     All questions were answered. The patient knows to call the clinic with any problems, questions or concerns.    Rachel Moulds, MD 05/06/23

## 2023-05-07 ENCOUNTER — Telehealth: Payer: Self-pay | Admitting: Hematology and Oncology

## 2023-05-07 DIAGNOSIS — D631 Anemia in chronic kidney disease: Secondary | ICD-10-CM | POA: Diagnosis not present

## 2023-05-07 DIAGNOSIS — N2581 Secondary hyperparathyroidism of renal origin: Secondary | ICD-10-CM | POA: Diagnosis not present

## 2023-05-07 DIAGNOSIS — Z992 Dependence on renal dialysis: Secondary | ICD-10-CM | POA: Diagnosis not present

## 2023-05-07 DIAGNOSIS — N186 End stage renal disease: Secondary | ICD-10-CM | POA: Diagnosis not present

## 2023-05-07 NOTE — Telephone Encounter (Signed)
Left patient a vm regarding upcoming appointment  

## 2023-05-08 ENCOUNTER — Other Ambulatory Visit: Payer: Self-pay

## 2023-05-08 ENCOUNTER — Ambulatory Visit (HOSPITAL_COMMUNITY): Payer: Medicare Other | Admitting: Physician Assistant

## 2023-05-08 ENCOUNTER — Ambulatory Visit
Admission: RE | Admit: 2023-05-08 | Discharge: 2023-05-08 | Disposition: A | Discharge status: Home / Self Care | Payer: Medicare Other | Source: Ambulatory Visit | Attending: General Surgery | Admitting: General Surgery

## 2023-05-08 ENCOUNTER — Encounter (HOSPITAL_COMMUNITY)
Admission: RE | Disposition: A | Discharge status: Home / Self Care | Payer: Self-pay | Source: Home / Self Care | Attending: General Surgery

## 2023-05-08 ENCOUNTER — Ambulatory Visit (HOSPITAL_BASED_OUTPATIENT_CLINIC_OR_DEPARTMENT_OTHER): Payer: Medicare Other | Admitting: Physician Assistant

## 2023-05-08 ENCOUNTER — Encounter (HOSPITAL_COMMUNITY): Payer: Self-pay | Admitting: General Surgery

## 2023-05-08 ENCOUNTER — Ambulatory Visit (HOSPITAL_COMMUNITY)
Admission: RE | Admit: 2023-05-08 | Discharge: 2023-05-09 | Disposition: A | Discharge status: Home / Self Care | Payer: Medicare Other | Attending: General Surgery | Admitting: General Surgery

## 2023-05-08 DIAGNOSIS — E1122 Type 2 diabetes mellitus with diabetic chronic kidney disease: Secondary | ICD-10-CM | POA: Diagnosis not present

## 2023-05-08 DIAGNOSIS — D631 Anemia in chronic kidney disease: Secondary | ICD-10-CM

## 2023-05-08 DIAGNOSIS — I132 Hypertensive heart and chronic kidney disease with heart failure and with stage 5 chronic kidney disease, or end stage renal disease: Secondary | ICD-10-CM | POA: Diagnosis not present

## 2023-05-08 DIAGNOSIS — I509 Heart failure, unspecified: Secondary | ICD-10-CM | POA: Diagnosis not present

## 2023-05-08 DIAGNOSIS — E119 Type 2 diabetes mellitus without complications: Secondary | ICD-10-CM

## 2023-05-08 DIAGNOSIS — N186 End stage renal disease: Secondary | ICD-10-CM

## 2023-05-08 DIAGNOSIS — C50511 Malignant neoplasm of lower-outer quadrant of right female breast: Secondary | ICD-10-CM

## 2023-05-08 DIAGNOSIS — D630 Anemia in neoplastic disease: Secondary | ICD-10-CM | POA: Diagnosis not present

## 2023-05-08 DIAGNOSIS — I2510 Atherosclerotic heart disease of native coronary artery without angina pectoris: Secondary | ICD-10-CM | POA: Diagnosis not present

## 2023-05-08 DIAGNOSIS — Z1231 Encounter for screening mammogram for malignant neoplasm of breast: Secondary | ICD-10-CM | POA: Diagnosis not present

## 2023-05-08 DIAGNOSIS — I4891 Unspecified atrial fibrillation: Secondary | ICD-10-CM | POA: Insufficient documentation

## 2023-05-08 DIAGNOSIS — Z170 Estrogen receptor positive status [ER+]: Secondary | ICD-10-CM | POA: Insufficient documentation

## 2023-05-08 DIAGNOSIS — C50911 Malignant neoplasm of unspecified site of right female breast: Secondary | ICD-10-CM | POA: Diagnosis not present

## 2023-05-08 DIAGNOSIS — Z992 Dependence on renal dialysis: Secondary | ICD-10-CM

## 2023-05-08 DIAGNOSIS — D0511 Intraductal carcinoma in situ of right breast: Secondary | ICD-10-CM | POA: Diagnosis not present

## 2023-05-08 DIAGNOSIS — Z7901 Long term (current) use of anticoagulants: Secondary | ICD-10-CM | POA: Diagnosis not present

## 2023-05-08 DIAGNOSIS — C50811 Malignant neoplasm of overlapping sites of right female breast: Secondary | ICD-10-CM | POA: Diagnosis not present

## 2023-05-08 DIAGNOSIS — I130 Hypertensive heart and chronic kidney disease with heart failure and stage 1 through stage 4 chronic kidney disease, or unspecified chronic kidney disease: Secondary | ICD-10-CM | POA: Diagnosis not present

## 2023-05-08 HISTORY — PX: BREAST LUMPECTOMY WITH RADIOACTIVE SEED LOCALIZATION: SHX6424

## 2023-05-08 LAB — GLUCOSE, CAPILLARY
Glucose-Capillary: 63 mg/dL — ABNORMAL LOW (ref 70–99)
Glucose-Capillary: 79 mg/dL (ref 70–99)
Glucose-Capillary: 97 mg/dL (ref 70–99)
Glucose-Capillary: 131 mg/dL — ABNORMAL HIGH (ref 70–99)

## 2023-05-08 SURGERY — BREAST LUMPECTOMY WITH RADIOACTIVE SEED LOCALIZATION
Anesthesia: General | Site: Breast | Laterality: Right

## 2023-05-08 MED ORDER — RENA-VITE PO TABS
1.0000 | ORAL_TABLET | Freq: Every day | ORAL | Status: DC
  Administered 2023-05-08: 1 via ORAL
  Filled 2023-05-08: qty 1

## 2023-05-08 MED ORDER — ATORVASTATIN CALCIUM 40 MG PO TABS: 40.0000 mg | ORAL_TABLET | Freq: Every day | ORAL | Status: DC

## 2023-05-08 MED ORDER — SODIUM CHLORIDE 0.9 % IV SOLN: INTRAVENOUS | Status: DC

## 2023-05-08 MED ORDER — MIDODRINE HCL 5 MG PO TABS
10.0000 mg | ORAL_TABLET | Freq: Three times a day (TID) | ORAL | Status: DC
  Administered 2023-05-08 – 2023-05-09 (×3): 10 mg via ORAL
  Filled 2023-05-08 (×3): qty 2

## 2023-05-08 MED ORDER — GABAPENTIN 300 MG PO CAPS: 300.0000 mg | ORAL_CAPSULE | ORAL | Status: AC

## 2023-05-08 MED ORDER — CHLORHEXIDINE GLUCONATE CLOTH 2 % EX PADS: 6.0000 | MEDICATED_PAD | Freq: Once | CUTANEOUS | Status: DC

## 2023-05-08 MED ORDER — APIXABAN 5 MG PO TABS
5.0000 mg | ORAL_TABLET | Freq: Two times a day (BID) | ORAL | Status: DC
  Administered 2023-05-09: 5 mg via ORAL
  Filled 2023-05-08: qty 1

## 2023-05-08 MED ORDER — LIDOCAINE 2% (20 MG/ML) 5 ML SYRINGE
INTRAMUSCULAR | Status: AC
  Filled 2023-05-08: qty 5

## 2023-05-08 MED ORDER — BISACODYL 5 MG PO TBEC: 5.0000 mg | DELAYED_RELEASE_TABLET | Freq: Every day | ORAL | Status: DC | PRN

## 2023-05-08 MED ORDER — PROPOFOL 10 MG/ML IV BOLUS
INTRAVENOUS | Status: DC | PRN
  Administered 2023-05-08: 150 mg via INTRAVENOUS

## 2023-05-08 MED ORDER — SODIUM CHLORIDE 0.9 % IV SOLN: INTRAVENOUS | Status: DC | PRN

## 2023-05-08 MED ORDER — ACETAMINOPHEN 500 MG PO TABS
ORAL_TABLET | ORAL | Status: AC
  Administered 2023-05-08: 1000 mg via ORAL
  Filled 2023-05-08: qty 2

## 2023-05-08 MED ORDER — ORAL CARE MOUTH RINSE
15.0000 mL | Freq: Once | OROMUCOSAL | Status: AC
  Administered 2023-05-08: 15 mL via OROMUCOSAL

## 2023-05-08 MED ORDER — LACTATED RINGERS IV SOLN: INTRAVENOUS | Status: DC

## 2023-05-08 MED ORDER — SUCROFERRIC OXYHYDROXIDE 500 MG PO CHEW
1000.0000 mg | CHEWABLE_TABLET | Freq: Three times a day (TID) | ORAL | Status: DC
  Administered 2023-05-08 – 2023-05-09 (×3): 1000 mg via ORAL
  Filled 2023-05-08 (×3): qty 2

## 2023-05-08 MED ORDER — DEXAMETHASONE SODIUM PHOSPHATE 10 MG/ML IJ SOLN
INTRAMUSCULAR | Status: AC
  Filled 2023-05-08: qty 1

## 2023-05-08 MED ORDER — CHLORHEXIDINE GLUCONATE 0.12 % MT SOLN
OROMUCOSAL | Status: AC
  Filled 2023-05-08: qty 15

## 2023-05-08 MED ORDER — OXYCODONE HCL 5 MG PO TABS
5.0000 mg | ORAL_TABLET | ORAL | Status: DC | PRN
  Administered 2023-05-08: 10 mg via ORAL
  Filled 2023-05-08: qty 2

## 2023-05-08 MED ORDER — AMIODARONE HCL 200 MG PO TABS
200.0000 mg | ORAL_TABLET | Freq: Every day | ORAL | Status: DC
  Administered 2023-05-08 – 2023-05-09 (×2): 200 mg via ORAL
  Filled 2023-05-08 (×2): qty 1

## 2023-05-08 MED ORDER — ONDANSETRON HCL 4 MG/2ML IJ SOLN
INTRAMUSCULAR | Status: AC
  Filled 2023-05-08: qty 2

## 2023-05-08 MED ORDER — ONDANSETRON HCL 4 MG/2ML IJ SOLN
INTRAMUSCULAR | Status: DC | PRN
  Administered 2023-05-08: 4 mg via INTRAVENOUS

## 2023-05-08 MED ORDER — AMISULPRIDE (ANTIEMETIC) 5 MG/2ML IV SOLN: 10.0000 mg | Freq: Once | INTRAVENOUS | Status: DC | PRN

## 2023-05-08 MED ORDER — FENTANYL CITRATE (PF) 250 MCG/5ML IJ SOLN
INTRAMUSCULAR | Status: AC
  Filled 2023-05-08: qty 5

## 2023-05-08 MED ORDER — COLCHICINE 0.6 MG PO TABS
0.6000 mg | ORAL_TABLET | ORAL | Status: DC
  Administered 2023-05-09: 0.6 mg via ORAL
  Filled 2023-05-08: qty 1

## 2023-05-08 MED ORDER — GABAPENTIN 100 MG PO CAPS
100.0000 mg | ORAL_CAPSULE | Freq: Every day | ORAL | Status: DC
  Administered 2023-05-08: 100 mg via ORAL
  Filled 2023-05-08: qty 1

## 2023-05-08 MED ORDER — LIDOCAINE 2% (20 MG/ML) 5 ML SYRINGE
INTRAMUSCULAR | Status: DC | PRN
  Administered 2023-05-08: 100 mg via INTRAVENOUS

## 2023-05-08 MED ORDER — BUPIVACAINE-EPINEPHRINE 0.25% -1:200000 IJ SOLN
INTRAMUSCULAR | Status: DC | PRN
  Administered 2023-05-08: 20 mL

## 2023-05-08 MED ORDER — ENOXAPARIN SODIUM 30 MG/0.3ML IJ SOSY
30.0000 mg | PREFILLED_SYRINGE | INTRAMUSCULAR | Status: DC
Start: 2023-05-09 — End: 2023-05-08

## 2023-05-08 MED ORDER — DIPHENHYDRAMINE HCL 25 MG PO CAPS: 25.0000 mg | ORAL_CAPSULE | Freq: Four times a day (QID) | ORAL | Status: DC | PRN

## 2023-05-08 MED ORDER — MIDAZOLAM HCL 2 MG/2ML IJ SOLN
INTRAMUSCULAR | Status: AC
  Filled 2023-05-08: qty 2

## 2023-05-08 MED ORDER — FENTANYL CITRATE (PF) 250 MCG/5ML IJ SOLN
INTRAMUSCULAR | Status: DC | PRN
  Administered 2023-05-08: 25 ug via INTRAVENOUS
  Administered 2023-05-08: 50 ug via INTRAVENOUS
  Administered 2023-05-08: 25 ug via INTRAVENOUS

## 2023-05-08 MED ORDER — DEXAMETHASONE SODIUM PHOSPHATE 10 MG/ML IJ SOLN
INTRAMUSCULAR | Status: DC | PRN
  Administered 2023-05-08: 5 mg via INTRAVENOUS

## 2023-05-08 MED ORDER — MORPHINE SULFATE (PF) 2 MG/ML IV SOLN: 1.0000 mg | INTRAVENOUS | Status: DC | PRN

## 2023-05-08 MED ORDER — ACETAMINOPHEN 500 MG PO TABS: 1000.0000 mg | ORAL_TABLET | Freq: Three times a day (TID) | ORAL | Status: DC | PRN

## 2023-05-08 MED ORDER — ACETAMINOPHEN 500 MG PO TABS: 1000.0000 mg | ORAL_TABLET | ORAL | Status: AC

## 2023-05-08 MED ORDER — CEFAZOLIN SODIUM-DEXTROSE 2-4 GM/100ML-% IV SOLN
INTRAVENOUS | Status: AC
  Filled 2023-05-08: qty 100

## 2023-05-08 MED ORDER — CEFAZOLIN SODIUM-DEXTROSE 2-4 GM/100ML-% IV SOLN
2.0000 g | INTRAVENOUS | Status: AC
  Administered 2023-05-08: 2 g via INTRAVENOUS

## 2023-05-08 MED ORDER — OXYCODONE HCL 5 MG PO TABS: 5.0000 mg | ORAL_TABLET | Freq: Once | ORAL | Status: DC | PRN

## 2023-05-08 MED ORDER — PROPOFOL 10 MG/ML IV BOLUS
INTRAVENOUS | Status: AC
  Filled 2023-05-08: qty 20

## 2023-05-08 MED ORDER — EPHEDRINE SULFATE-NACL 50-0.9 MG/10ML-% IV SOSY
PREFILLED_SYRINGE | INTRAVENOUS | Status: DC | PRN
  Administered 2023-05-08: 5 mg via INTRAVENOUS
  Administered 2023-05-08: 10 mg via INTRAVENOUS
  Administered 2023-05-08: 5 mg via INTRAVENOUS

## 2023-05-08 MED ORDER — DEXTROSE 50 % IV SOLN: 12.5000 g | Freq: Once | INTRAVENOUS | Status: DC

## 2023-05-08 MED ORDER — GABAPENTIN 300 MG PO CAPS
ORAL_CAPSULE | ORAL | Status: AC
  Administered 2023-05-08: 300 mg via ORAL
  Filled 2023-05-08: qty 1

## 2023-05-08 MED ORDER — DIPHENHYDRAMINE HCL 50 MG/ML IJ SOLN
INTRAMUSCULAR | Status: DC | PRN
  Administered 2023-05-08: 12.5 mg via INTRAVENOUS

## 2023-05-08 MED ORDER — OXYCODONE HCL 5 MG/5ML PO SOLN: 5.0000 mg | Freq: Once | ORAL | Status: DC | PRN

## 2023-05-08 MED ORDER — PHENYLEPHRINE HCL-NACL 20-0.9 MG/250ML-% IV SOLN
INTRAVENOUS | Status: DC | PRN
  Administered 2023-05-08: 50 ug/min via INTRAVENOUS

## 2023-05-08 MED ORDER — FENTANYL CITRATE (PF) 100 MCG/2ML IJ SOLN: 25.0000 ug | INTRAMUSCULAR | Status: DC | PRN

## 2023-05-08 MED ORDER — PHENYLEPHRINE 80 MCG/ML (10ML) SYRINGE FOR IV PUSH (FOR BLOOD PRESSURE SUPPORT)
PREFILLED_SYRINGE | INTRAVENOUS | Status: DC | PRN
  Administered 2023-05-08: 160 ug via INTRAVENOUS

## 2023-05-08 MED ORDER — OXYCODONE HCL 5 MG PO TABS: 5.0000 mg | ORAL_TABLET | Freq: Four times a day (QID) | ORAL | 0 refills | Status: DC | PRN

## 2023-05-08 SURGICAL SUPPLY — 35 items
ADH SKN CLS APL DERMABOND .7 (GAUZE/BANDAGES/DRESSINGS) ×1
APL PRP STRL LF DISP 70% ISPRP (MISCELLANEOUS) ×1
APPLIER CLIP 9.375 MED OPEN (MISCELLANEOUS)
APR CLP MED 9.3 20 MLT OPN (MISCELLANEOUS)
BAG COUNTER SPONGE SURGICOUNT (BAG) IMPLANT
BAG SPNG CNTER NS LX DISP (BAG)
BINDER BREAST LRG (GAUZE/BANDAGES/DRESSINGS) IMPLANT
BINDER BREAST XLRG (GAUZE/BANDAGES/DRESSINGS) IMPLANT
CANISTER SUCT 3000ML PPV (MISCELLANEOUS) ×2 IMPLANT
CHLORAPREP W/TINT 26 (MISCELLANEOUS) ×2 IMPLANT
CLIP APPLIE 9.375 MED OPEN (MISCELLANEOUS) IMPLANT
COVER PROBE W GEL 5X96 (DRAPES) ×2 IMPLANT
COVER SURGICAL LIGHT HANDLE (MISCELLANEOUS) ×2 IMPLANT
DERMABOND ADVANCED .7 DNX12 (GAUZE/BANDAGES/DRESSINGS) ×2 IMPLANT
DEVICE DUBIN SPECIMEN MAMMOGRA (MISCELLANEOUS) ×2 IMPLANT
DRAPE CHEST BREAST 15X10 FENES (DRAPES) ×2 IMPLANT
ELECT COATED BLADE 2.86 ST (ELECTRODE) ×2 IMPLANT
ELECT REM PT RETURN 9FT ADLT (ELECTROSURGICAL) ×1
ELECTRODE REM PT RTRN 9FT ADLT (ELECTROSURGICAL) ×2 IMPLANT
GLOVE BIO SURGEON STRL SZ7.5 (GLOVE) ×4 IMPLANT
GOWN STRL REUS W/ TWL LRG LVL3 (GOWN DISPOSABLE) ×4 IMPLANT
GOWN STRL REUS W/TWL LRG LVL3 (GOWN DISPOSABLE) ×2
KIT BASIN OR (CUSTOM PROCEDURE TRAY) ×2 IMPLANT
KIT MARKER MARGIN INK (KITS) ×2 IMPLANT
LIGHT WAVEGUIDE WIDE FLAT (MISCELLANEOUS) IMPLANT
NDL HYPO 25GX1X1/2 BEV (NEEDLE) ×2 IMPLANT
NEEDLE HYPO 25GX1X1/2 BEV (NEEDLE) ×1 IMPLANT
NS IRRIG 1000ML POUR BTL (IV SOLUTION) ×2 IMPLANT
PACK GENERAL/GYN (CUSTOM PROCEDURE TRAY) ×2 IMPLANT
SUT MNCRL AB 4-0 PS2 18 (SUTURE) ×2 IMPLANT
SUT SILK 2 0 SH (SUTURE) IMPLANT
SUT VIC AB 3-0 SH 18 (SUTURE) ×2 IMPLANT
SYR CONTROL 10ML LL (SYRINGE) ×2 IMPLANT
TOWEL GREEN STERILE (TOWEL DISPOSABLE) ×2 IMPLANT
TOWEL GREEN STERILE FF (TOWEL DISPOSABLE) ×2 IMPLANT

## 2023-05-08 NOTE — Anesthesia Postprocedure Evaluation (Signed)
Anesthesia Post Note  Patient: Brenda Arnold  Procedure(s) Performed: RIGHT BREAST LUMPECTOMY WITH RADIOACTIVE SEED LOCALIZATION (Right: Breast)     Patient location during evaluation: PACU Anesthesia Type: General Level of consciousness: awake Pain management: pain level controlled Vital Signs Assessment: post-procedure vital signs reviewed and stable Respiratory status: spontaneous breathing, nonlabored ventilation and respiratory function stable Cardiovascular status: blood pressure returned to baseline and stable Postop Assessment: no apparent nausea or vomiting Anesthetic complications: no   No notable events documented.  Last Vitals:  Vitals:   05/08/23 1330 05/08/23 1345  BP: (!) 161/66 (!) 169/45  Pulse: 70 69  Resp: 13 20  Temp:    SpO2: 98% 98%    Last Pain:  Vitals:   05/08/23 1345  TempSrc:   PainSc: 0-No pain   Pain Goal:                   Linton Rump

## 2023-05-08 NOTE — H&P (Signed)
REFERRING PHYSICIAN: Amil Amen, MD PROVIDER: Lindell Noe, MD MRN: [id number] DOB: [birth year] Subjective   Chief Complaint: New Consultation (BREAST)  History of Present Illness: Brenda Arnold is a 67 y.o. female who is seen today as an office consultation for evaluation of New Consultation (BREAST)  We are asked to see the patient in consultation by Dr. Anselm Jungling to evaluate her for a new right breast cancer. The patient is a 67 year old white female who recently went for a routine screening mammogram. At that time she was found to have a 1.3 cm mass in the lower aspect of the right breast. The lymph nodes looked normal. The mass was biopsied and came back as a grade 1 invasive ductal cancer that was ER positive and PR negative and HER2 negative with a Ki-67 of 10%. She is on dialysis and takes Eliquis for atrial fibrillation. She is followed by Dr. Mayford Knife. She does not smoke.  Review of Systems: A complete review of systems was obtained from the patient. I have reviewed this information and discussed as appropriate with the patient. See HPI as well for other ROS.  ROS   Medical History: History reviewed. No pertinent past medical history.  Patient Active Problem List  Diagnosis  Malignant neoplasm of lower-outer quadrant of right breast of female, estrogen receptor positive (CMS/HHS-HCC)   History reviewed. No pertinent surgical history.   Allergies  Allergen Reactions  Iodine Shortness Of Breath  Shellfish Containing Products Shortness Of Breath  Amlodipine Swelling   Current Outpatient Medications on File Prior to Visit  Medication Sig Dispense Refill  gabapentin (NEURONTIN) 100 MG capsule Take by mouth  psyllium seed, with dextrose, (FIBER ORAL) Take by mouth once daily  RENA-VITE 0.8 mg TAKE 1 TABLET BY MOUTH EVERY EVENING FOR RENAL VITAMINS 3  VELPHORO 500 mg tablet Take 500 mg by mouth Takes 3-4 times daily.   No current facility-administered  medications on file prior to visit.   History reviewed. No pertinent family history.   Social History   Tobacco Use  Smoking Status Never  Smokeless Tobacco Never    Social History   Socioeconomic History  Marital status: Single  Tobacco Use  Smoking status: Never  Smokeless tobacco: Never  Substance and Sexual Activity  Alcohol use: Never  Drug use: Never   Objective:  There were no vitals filed for this visit.  There is no height or weight on file to calculate BMI.  Physical Exam Vitals reviewed.  Constitutional:  General: She is not in acute distress. Appearance: Normal appearance.  HENT:  Head: Normocephalic and atraumatic.  Right Ear: External ear normal.  Left Ear: External ear normal.  Nose: Nose normal.  Mouth/Throat:  Mouth: Mucous membranes are moist.  Pharynx: Oropharynx is clear.  Eyes:  General: No scleral icterus. Extraocular Movements: Extraocular movements intact.  Conjunctiva/sclera: Conjunctivae normal.  Pupils: Pupils are equal, round, and reactive to light.  Cardiovascular:  Rate and Rhythm: Normal rate and regular rhythm.  Pulses: Normal pulses.  Heart sounds: Normal heart sounds.  Pulmonary:  Effort: Pulmonary effort is normal. No respiratory distress.  Breath sounds: Normal breath sounds.  Abdominal:  General: Bowel sounds are normal.  Palpations: Abdomen is soft.  Tenderness: There is no abdominal tenderness.  Musculoskeletal:  General: No swelling, tenderness or deformity. Normal range of motion.  Cervical back: Normal range of motion and neck supple.  Comments: There is a fistula in the left forearm with a good thrill  Skin: General: Skin is  warm and dry.  Coloration: Skin is not jaundiced.  Neurological:  General: No focal deficit present.  Mental Status: She is alert and oriented to person, place, and time.  Psychiatric:  Mood and Affect: Mood normal.  Behavior: Behavior normal.     Breast: There is no palpable mass in  either breast. There is no palpable axillary, supraclavicular, or cervical lymphadenopathy  Labs, Imaging and Diagnostic Testing:  Assessment and Plan:   Diagnoses and all orders for this visit:  Malignant neoplasm of lower-outer quadrant of right breast of female, estrogen receptor positive (CMS/HHS-HCC)   The patient appears to have a 1.3 cm cancer in the lower portion of the right breast with clinically negative nodes and favorable markers. I have discussed with her in detail the different options for treatment and at this point she favors breast conservation which I feel is very reasonable. She will not need a node evaluation. I have discussed with her in detail the risks and benefits of the operation as well as some of the technical aspects including use of a radioactive seed for localization and she understands and wishes to proceed. We will obtain cardiac clearance prior to scheduling. I will go ahead and refer her to medical and radiation oncology to discuss adjuvant therapy. She will need to be off the Eliquis for at least a couple days prior to surgery

## 2023-05-08 NOTE — Op Note (Signed)
05/08/2023  12:45 PM  PATIENT:  Brenda Arnold  67 y.o. female  PRE-OPERATIVE DIAGNOSIS:  RIGHT BREAST CANCER  POST-OPERATIVE DIAGNOSIS:  RIGHT BREAST CANCER  PROCEDURE:  Procedure(s): RIGHT BREAST LUMPECTOMY WITH RADIOACTIVE SEED LOCALIZATION (Right)  SURGEON:  Surgeon(s) and Role:    * Griselda Miner, MD - Primary  PHYSICIAN ASSISTANT:   ASSISTANTS: none   ANESTHESIA:   local and general  EBL:  minimal   BLOOD ADMINISTERED:none  DRAINS: none   LOCAL MEDICATIONS USED:  MARCAINE     SPECIMEN:  Source of Specimen:  right breast tissue  DISPOSITION OF SPECIMEN:  PATHOLOGY  COUNTS:  YES  TOURNIQUET:  * No tourniquets in log *  DICTATION: .Dragon Dictation  After informed consent was obtained the patient was brought to the operating room and placed in the supine position on the operating table.  After adequate induction of general anesthesia the patient's right breast was prepped with DuraPrep, allowed to dry, and draped in usual sterile manner.  An appropriate timeout was performed.  Previously an I-125 seed was placed in the lower portion of the right breast to mark an area of invasive breast cancer.  The neoprobe was set to I-125 in the area of radioactivity was readily identified.  The area around this was infiltrated with quarter percent Marcaine.  A curvilinear incision was made with a 15 blade knife along the lower edge of the areola of the right breast.  The incision was carried through the skin and subcutaneous tissue sharply with the electrocautery.  Dissection was then carried towards the radioactive seed under the direction of the neoprobe.  Once I more closely approach the radioactive seed I then removed a circular portion of breast tissue sharply with the electrocautery around the radioactive seed while checking the area of radioactivity frequently.  Once the specimen was removed it was oriented with the appropriate paint colors.  A specimen radiograph was obtained  that showed the clip and seed to be near the center of the specimen.  The specimen was then sent to pathology for further evaluation.  Hemostasis was achieved using the Bovie electrocautery.  The wound was irrigated with saline and infiltrated with more quarter percent Marcaine.  The cavity was marked with clips.  The deep layer of the incision was then closed with layers of interrupted 3-0 Vicryl stitches.  The skin was then closed with interrupted 4-0 Monocryl subcuticular stitches.  Dermabond dressings were applied.  The patient tolerated the procedure well.  At the end of the case all needle sponge and instrument counts were correct.  The patient was then awakened and taken to recovery in stable condition.  PLAN OF CARE: Admit for overnight observation  PATIENT DISPOSITION:  PACU - hemodynamically stable.   Delay start of Pharmacological VTE agent (>24hrs) due to surgical blood loss or risk of bleeding: no

## 2023-05-08 NOTE — Interval H&P Note (Signed)
History and Physical Interval Note:  05/08/2023 10:20 AM  Brenda Arnold  has presented today for surgery, with the diagnosis of RIGHT BREAST CANCER.  The various methods of treatment have been discussed with the patient and family. After consideration of risks, benefits and other options for treatment, the patient has consented to  Procedure(s): RIGHT BREAST LUMPECTOMY WITH RADIOACTIVE SEED LOCALIZATION (Right) as a surgical intervention.  The patient's history has been reviewed, patient examined, no change in status, stable for surgery.  I have reviewed the patient's chart and labs.  Questions were answered to the patient's satisfaction.     Chevis Pretty III

## 2023-05-08 NOTE — Transfer of Care (Signed)
Immediate Anesthesia Transfer of Care Note  Patient: Brenda Arnold  Procedure(s) Performed: RIGHT BREAST LUMPECTOMY WITH RADIOACTIVE SEED LOCALIZATION (Right: Breast)  Patient Location: PACU  Anesthesia Type:General  Level of Consciousness: awake, alert , and oriented  Airway & Oxygen Therapy: Patient Spontanous Breathing and Patient connected to face mask oxygen  Post-op Assessment: Report given to RN and Post -op Vital signs reviewed and stable  Post vital signs: Reviewed and stable  Last Vitals: SEE PACU VITALS Vitals Value Taken Time  BP    Temp    Pulse    Resp    SpO2      Last Pain:  Vitals:   05/08/23 1033  TempSrc:   PainSc: 0-No pain         Complications: No notable events documented.

## 2023-05-08 NOTE — Plan of Care (Signed)
  Problem: Education: Goal: Knowledge of General Education information will improve Description: Including pain rating scale, medication(s)/side effects and non-pharmacologic comfort measures Outcome: Progressing   Problem: Clinical Measurements: Goal: Will remain free from infection Outcome: Progressing   Problem: Nutrition: Goal: Adequate nutrition will be maintained Outcome: Progressing   Problem: Elimination: Goal: Will not experience complications related to bowel motility Outcome: Progressing   Problem: Pain Managment: Goal: General experience of comfort will improve Outcome: Progressing   

## 2023-05-08 NOTE — Anesthesia Procedure Notes (Signed)
Procedure Name: LMA Insertion Date/Time: 05/08/2023 12:04 PM  Performed by: Lelon Perla, CRNAPre-anesthesia Checklist: Patient identified, Emergency Drugs available, Suction available and Patient being monitored Patient Re-evaluated:Patient Re-evaluated prior to induction Oxygen Delivery Method: Circle System Utilized Preoxygenation: Pre-oxygenation with 100% oxygen Induction Type: IV induction Ventilation: Mask ventilation without difficulty LMA: LMA inserted LMA Size: 4.0 Number of attempts: 1 Airway Equipment and Method: Bite block Placement Confirmation: positive ETCO2 Tube secured with: Tape Dental Injury: Teeth and Oropharynx as per pre-operative assessment

## 2023-05-09 ENCOUNTER — Encounter (HOSPITAL_COMMUNITY): Payer: Self-pay | Admitting: General Surgery

## 2023-05-09 DIAGNOSIS — C50911 Malignant neoplasm of unspecified site of right female breast: Secondary | ICD-10-CM | POA: Diagnosis not present

## 2023-05-09 DIAGNOSIS — Z7901 Long term (current) use of anticoagulants: Secondary | ICD-10-CM | POA: Diagnosis not present

## 2023-05-09 DIAGNOSIS — I4891 Unspecified atrial fibrillation: Secondary | ICD-10-CM | POA: Diagnosis not present

## 2023-05-09 DIAGNOSIS — Z170 Estrogen receptor positive status [ER+]: Secondary | ICD-10-CM | POA: Diagnosis not present

## 2023-05-09 DIAGNOSIS — Z1231 Encounter for screening mammogram for malignant neoplasm of breast: Secondary | ICD-10-CM | POA: Diagnosis not present

## 2023-05-09 DIAGNOSIS — I132 Hypertensive heart and chronic kidney disease with heart failure and with stage 5 chronic kidney disease, or end stage renal disease: Secondary | ICD-10-CM | POA: Diagnosis not present

## 2023-05-09 LAB — HEPATITIS B SURFACE ANTIGEN
Hepatitis B Surface Ag: NONREACTIVE
Hepatitis B Surface Ag: NONREACTIVE

## 2023-05-09 LAB — RENAL FUNCTION PANEL
Albumin: 3.4 g/dL — ABNORMAL LOW (ref 3.5–5.0)
Anion gap: 16 — ABNORMAL HIGH (ref 5–15)
BUN: 59 mg/dL — ABNORMAL HIGH (ref 8–23)
CO2: 27 mmol/L (ref 22–32)
Calcium: 7.5 mg/dL — ABNORMAL LOW (ref 8.9–10.3)
Chloride: 95 mmol/L — ABNORMAL LOW (ref 98–111)
Creatinine, Ser: 11.35 mg/dL — ABNORMAL HIGH (ref 0.44–1.00)
GFR, Estimated: 3 mL/min — ABNORMAL LOW (ref >60)
Glucose, Bld: 132 mg/dL — ABNORMAL HIGH (ref 70–99)
Phosphorus: 4.4 mg/dL (ref 2.5–4.6)
Potassium: 3.5 mmol/L (ref 3.5–5.1)
Sodium: 138 mmol/L (ref 135–145)

## 2023-05-09 LAB — CBC
HCT: 30.8 % — ABNORMAL LOW (ref 36.0–46.0)
Hemoglobin: 10.1 g/dL — ABNORMAL LOW (ref 12.0–15.0)
MCH: 32.2 pg (ref 26.0–34.0)
MCHC: 32.8 g/dL (ref 30.0–36.0)
MCV: 98.1 fL (ref 80.0–100.0)
Platelets: 189 10*3/uL (ref 150–400)
RBC: 3.14 MIL/uL — ABNORMAL LOW (ref 3.87–5.11)
RDW: 14.6 % (ref 11.5–15.5)
WBC: 13.8 10*3/uL — ABNORMAL HIGH (ref 4.0–10.5)
nRBC: 0 % (ref 0.0–0.2)

## 2023-05-09 MED ORDER — LIDOCAINE HCL (PF) 1 % IJ SOLN: 5.0000 mL | INTRAMUSCULAR | Status: DC | PRN

## 2023-05-09 MED ORDER — HEPARIN SODIUM (PORCINE) 1000 UNIT/ML DIALYSIS: 1000.0000 [IU] | INTRAMUSCULAR | Status: DC | PRN

## 2023-05-09 MED ORDER — ALTEPLASE 2 MG IJ SOLR: 2.0000 mg | Freq: Once | INTRAMUSCULAR | Status: DC | PRN

## 2023-05-09 MED ORDER — LIDOCAINE-PRILOCAINE 2.5-2.5 % EX CREA: 1.0000 | TOPICAL_CREAM | CUTANEOUS | Status: DC | PRN

## 2023-05-09 MED ORDER — PENTAFLUOROPROP-TETRAFLUOROETH EX AERO: 1.0000 | INHALATION_SPRAY | CUTANEOUS | Status: DC | PRN

## 2023-05-09 NOTE — Progress Notes (Signed)
D/C order noted. Contacted renal NP to discuss pt's case. It was felt that it would be in pt's best interest for pt to receive inpt HD then d/c. Contacted [facility] and spoke to [place]. Clinic advised pt will d/c today and resume care on Monday.   Olivia Canter Renal Navigator [phone]

## 2023-05-09 NOTE — Procedures (Signed)
I was present at this dialysis session. I have reviewed the session itself and made appropriate changes.   Vital signs in last 24 hours:  Temp:  [97.6 F (36.4 C)-97.9 F (36.6 C)] 97.7 F (36.5 C) (06/07 1337) Pulse Rate:  [64-79] 74 (06/07 1337) Resp:  [11-18] 17 (06/07 1337) BP: (99-111)/(51-59) 106/59 (06/07 1337) SpO2:  [94 %-100 %] 98 % (06/07 1337) Weight:  [111.4 kg] 111.4 kg (06/07 1337) Weight change:  Filed Weights   05/08/23 0949 05/09/23 1337  Weight: 111.1 kg 111.4 kg    No results for input(s): "NA", "K", "CL", "CO2", "GLUCOSE", "BUN", "CREATININE", "CALCIUM", "PHOS" in the last 168 hours.  Invalid input(s): "ALB"  No results for input(s): "WBC", "NEUTROABS", "HGB", "HCT", "MCV", "PLT" in the last 168 hours.  Scheduled Meds:  amiodarone  200 mg Oral Daily   apixaban  5 mg Oral BID   atorvastatin  40 mg Oral Daily   colchicine  0.6 mg Oral Q M,W,F   gabapentin  100 mg Oral QHS   midodrine  10 mg Oral TID WC   multivitamin  1 tablet Oral QHS   sucroferric oxyhydroxide  1,000 mg Oral TID WC   Continuous Infusions:  sodium chloride     PRN Meds:.alteplase, bisacodyl, diphenhydrAMINE, heparin, lidocaine (PF), lidocaine-prilocaine, morphine injection, oxyCODONE, pentafluoroprop-tetrafluoroeth   Irena Cords,  MD 05/09/2023, 1:47 PM

## 2023-05-09 NOTE — Progress Notes (Addendum)
Surgical on call provider notified [id number] hypotension post dialysis; OK to continue with discharge plan per provider.  Discharge instructions reviewed with patient, questions encouraged and answered. IV removed; patient dressed self, and assisted to wheel chair and discharged via wheel chair with belongings.

## 2023-05-09 NOTE — Plan of Care (Signed)

## 2023-05-09 NOTE — Progress Notes (Signed)
Ms. Notarianni is a 67 year old who is a s/p right breast lumpectomy with radioactive seed localization for right breast cancer. She receives HD at [facility] MWF. Plan for her to receive HD this afternoon, and if she remains medically stable, she can be discharged later today (after HD) or early tomorrow morning. Will do a formal consult only if she ends up being admitted.  OP HD Orders [facility]-MWF BFR 400, DFR Auto 1.5 EDW 108.6kg, 2K/2Ca L AVF  Salome Holmes, NP [employer]

## 2023-05-09 NOTE — Progress Notes (Signed)
1 Day Post-Op   Subjective/Chief Complaint: No complaints   Objective: Vital signs in last 24 hours: Temp:  [97.6 F (36.4 C)-98.5 F (36.9 C)] 97.8 F (36.6 C) (06/07 0000) Pulse Rate:  [54-79] 70 (06/07 0000) Resp:  [11-20] 16 (06/07 0000) BP: (99-179)/(42-66) 111/51 (06/07 0000) SpO2:  [94 %-100 %] 98 % (06/07 0000) Weight:  [111.1 kg] 111.1 kg (06/06 0949) Last BM Date : 05/07/23  Intake/Output from previous day: 06/06 0701 - 06/07 0700 In: 400 [P.O.:200; I.V.:200] Out: 0  Intake/Output this shift: No intake/output data recorded.  General appearance: alert and cooperative Resp: clear to auscultation bilaterally Breasts: incision ok Cardio: regular rate and rhythm GI: soft, non-tender; bowel sounds normal; no masses,  no organomegaly  Lab Results:  No results for input(s): "WBC", "HGB", "HCT", "PLT" in the last 72 hours. BMET No results for input(s): "NA", "K", "CL", "CO2", "GLUCOSE", "BUN", "CREATININE", "CALCIUM" in the last 72 hours. PT/INR No results for input(s): "LABPROT", "INR" in the last 72 hours. ABG No results for input(s): "PHART", "HCO3" in the last 72 hours.  Invalid input(s): "PCO2", "PO2"  Studies/Results: No results found.  Anti-infectives: Anti-infectives (From admission, onward)    Start     Dose/Rate Route Frequency Ordered Stop   05/08/23 1000  ceFAZolin (ANCEF) IVPB 2g/100 mL premix        2 g 200 mL/hr over 30 Minutes Intravenous On call to O.R. 05/08/23 0957 05/08/23 1207   05/08/23 0959  ceFAZolin (ANCEF) 2-4 GM/100ML-% IVPB       Note to Pharmacy: Camillo Flaming: cabinet override      05/08/23 0959 05/08/23 1207       Assessment/Plan: s/p Procedure(s): RIGHT BREAST LUMPECTOMY WITH RADIOACTIVE SEED LOCALIZATION (Right) Advance diet Plan for dialysis today then d/c afterward  LOS: 0 days    Brenda Arnold 05/09/2023

## 2023-05-09 NOTE — Progress Notes (Signed)
Received patient in bed.Awake,alert and oriented x 4.   Access used: Left upper arm AVF that worked well.  Duration of treatment:3.5 hours  Fluid removed : 2.4 liters.  Hemo comment:Tolerated treatment well.  Hand off to the patient's nurse.

## 2023-05-10 LAB — HEPATITIS B SURFACE ANTIBODY, QUANTITATIVE
Hep B S AB Quant (Post): 81.6 m[IU]/mL (ref >9.9)
Hep B S AB Quant (Post): 43.6 m[IU]/mL (ref >9.9)

## 2023-05-12 DIAGNOSIS — N186 End stage renal disease: Secondary | ICD-10-CM | POA: Diagnosis not present

## 2023-05-12 DIAGNOSIS — D631 Anemia in chronic kidney disease: Secondary | ICD-10-CM | POA: Diagnosis not present

## 2023-05-12 DIAGNOSIS — N2581 Secondary hyperparathyroidism of renal origin: Secondary | ICD-10-CM | POA: Diagnosis not present

## 2023-05-12 DIAGNOSIS — Z992 Dependence on renal dialysis: Secondary | ICD-10-CM | POA: Diagnosis not present

## 2023-05-13 ENCOUNTER — Telehealth: Payer: Self-pay | Admitting: *Deleted

## 2023-05-13 LAB — POCT I-STAT, CHEM 8
BUN: 38 mg/dL — ABNORMAL HIGH (ref 8–23)
Calcium, Ion: 0.8 mmol/L — CL (ref 1.15–1.40)
Chloride: 98 mmol/L (ref 98–111)
Creatinine, Ser: 9.2 mg/dL — ABNORMAL HIGH (ref 0.44–1.00)
Glucose, Bld: 71 mg/dL (ref 70–99)
HCT: 31 % — ABNORMAL LOW (ref 36.0–46.0)
Hemoglobin: 10.5 g/dL — ABNORMAL LOW (ref 12.0–15.0)
Potassium: 3.7 mmol/L (ref 3.5–5.1)
Sodium: 140 mmol/L (ref 135–145)
TCO2: 36 mmol/L — ABNORMAL HIGH (ref 22–32)

## 2023-05-14 DIAGNOSIS — Z992 Dependence on renal dialysis: Secondary | ICD-10-CM | POA: Diagnosis not present

## 2023-05-14 DIAGNOSIS — N186 End stage renal disease: Secondary | ICD-10-CM | POA: Diagnosis not present

## 2023-05-14 DIAGNOSIS — D631 Anemia in chronic kidney disease: Secondary | ICD-10-CM | POA: Diagnosis not present

## 2023-05-14 DIAGNOSIS — N2581 Secondary hyperparathyroidism of renal origin: Secondary | ICD-10-CM | POA: Diagnosis not present

## 2023-05-16 ENCOUNTER — Encounter: Payer: Self-pay | Admitting: *Deleted

## 2023-05-16 DIAGNOSIS — N186 End stage renal disease: Secondary | ICD-10-CM | POA: Diagnosis not present

## 2023-05-16 DIAGNOSIS — Z992 Dependence on renal dialysis: Secondary | ICD-10-CM | POA: Diagnosis not present

## 2023-05-16 DIAGNOSIS — N2581 Secondary hyperparathyroidism of renal origin: Secondary | ICD-10-CM | POA: Diagnosis not present

## 2023-05-16 DIAGNOSIS — D631 Anemia in chronic kidney disease: Secondary | ICD-10-CM | POA: Diagnosis not present

## 2023-05-16 LAB — SURGICAL PATHOLOGY

## 2023-05-16 NOTE — Telephone Encounter (Signed)
Called pt for follow to assess needs after surgery and further questions regarding treatment plan. Left vm requesting return call. Provided contact information

## 2023-05-19 DIAGNOSIS — N2581 Secondary hyperparathyroidism of renal origin: Secondary | ICD-10-CM | POA: Diagnosis not present

## 2023-05-19 DIAGNOSIS — N186 End stage renal disease: Secondary | ICD-10-CM | POA: Diagnosis not present

## 2023-05-19 DIAGNOSIS — Z992 Dependence on renal dialysis: Secondary | ICD-10-CM | POA: Diagnosis not present

## 2023-05-19 DIAGNOSIS — D631 Anemia in chronic kidney disease: Secondary | ICD-10-CM | POA: Diagnosis not present

## 2023-05-20 ENCOUNTER — Encounter: Payer: Self-pay | Admitting: *Deleted

## 2023-05-20 ENCOUNTER — Telehealth: Payer: Self-pay | Admitting: *Deleted

## 2023-05-20 NOTE — Telephone Encounter (Signed)
Received order for oncotype testing. Requisition sent to pathology. 

## 2023-05-21 DIAGNOSIS — N186 End stage renal disease: Secondary | ICD-10-CM | POA: Diagnosis not present

## 2023-05-21 DIAGNOSIS — Z992 Dependence on renal dialysis: Secondary | ICD-10-CM | POA: Diagnosis not present

## 2023-05-21 DIAGNOSIS — N2581 Secondary hyperparathyroidism of renal origin: Secondary | ICD-10-CM | POA: Diagnosis not present

## 2023-05-21 DIAGNOSIS — D631 Anemia in chronic kidney disease: Secondary | ICD-10-CM | POA: Diagnosis not present

## 2023-05-23 DIAGNOSIS — N186 End stage renal disease: Secondary | ICD-10-CM | POA: Diagnosis not present

## 2023-05-23 DIAGNOSIS — Z992 Dependence on renal dialysis: Secondary | ICD-10-CM | POA: Diagnosis not present

## 2023-05-23 DIAGNOSIS — D631 Anemia in chronic kidney disease: Secondary | ICD-10-CM | POA: Diagnosis not present

## 2023-05-23 DIAGNOSIS — N2581 Secondary hyperparathyroidism of renal origin: Secondary | ICD-10-CM | POA: Diagnosis not present

## 2023-05-26 DIAGNOSIS — N2581 Secondary hyperparathyroidism of renal origin: Secondary | ICD-10-CM | POA: Diagnosis not present

## 2023-05-26 DIAGNOSIS — N186 End stage renal disease: Secondary | ICD-10-CM | POA: Diagnosis not present

## 2023-05-26 DIAGNOSIS — D631 Anemia in chronic kidney disease: Secondary | ICD-10-CM | POA: Diagnosis not present

## 2023-05-26 DIAGNOSIS — Z992 Dependence on renal dialysis: Secondary | ICD-10-CM | POA: Diagnosis not present

## 2023-05-28 DIAGNOSIS — N2581 Secondary hyperparathyroidism of renal origin: Secondary | ICD-10-CM | POA: Diagnosis not present

## 2023-05-28 DIAGNOSIS — Z992 Dependence on renal dialysis: Secondary | ICD-10-CM | POA: Diagnosis not present

## 2023-05-28 DIAGNOSIS — D631 Anemia in chronic kidney disease: Secondary | ICD-10-CM | POA: Diagnosis not present

## 2023-05-28 DIAGNOSIS — N186 End stage renal disease: Secondary | ICD-10-CM | POA: Diagnosis not present

## 2023-05-30 ENCOUNTER — Encounter (HOSPITAL_COMMUNITY): Payer: Self-pay

## 2023-05-30 ENCOUNTER — Encounter: Payer: Self-pay | Admitting: Hematology and Oncology

## 2023-05-30 ENCOUNTER — Encounter: Payer: Self-pay | Admitting: *Deleted

## 2023-05-30 DIAGNOSIS — C50811 Malignant neoplasm of overlapping sites of right female breast: Secondary | ICD-10-CM

## 2023-05-30 DIAGNOSIS — N186 End stage renal disease: Secondary | ICD-10-CM | POA: Diagnosis not present

## 2023-05-30 DIAGNOSIS — N2581 Secondary hyperparathyroidism of renal origin: Secondary | ICD-10-CM | POA: Diagnosis not present

## 2023-05-30 DIAGNOSIS — Z170 Estrogen receptor positive status [ER+]: Secondary | ICD-10-CM

## 2023-05-30 DIAGNOSIS — D631 Anemia in chronic kidney disease: Secondary | ICD-10-CM | POA: Diagnosis not present

## 2023-05-30 DIAGNOSIS — Z992 Dependence on renal dialysis: Secondary | ICD-10-CM | POA: Diagnosis not present

## 2023-06-02 DIAGNOSIS — Z992 Dependence on renal dialysis: Secondary | ICD-10-CM | POA: Diagnosis not present

## 2023-06-02 DIAGNOSIS — N2581 Secondary hyperparathyroidism of renal origin: Secondary | ICD-10-CM | POA: Diagnosis not present

## 2023-06-02 DIAGNOSIS — D631 Anemia in chronic kidney disease: Secondary | ICD-10-CM | POA: Diagnosis not present

## 2023-06-02 DIAGNOSIS — N186 End stage renal disease: Secondary | ICD-10-CM | POA: Diagnosis not present

## 2023-06-02 DIAGNOSIS — E1122 Type 2 diabetes mellitus with diabetic chronic kidney disease: Secondary | ICD-10-CM | POA: Diagnosis not present

## 2023-06-03 ENCOUNTER — Ambulatory Visit
Admission: RE | Admit: 2023-06-03 | Discharge: 2023-06-03 | Discharge status: Home / Self Care | Payer: No Typology Code available for payment source | Source: Ambulatory Visit | Attending: Radiation Oncology | Admitting: Radiation Oncology

## 2023-06-03 ENCOUNTER — Encounter: Payer: Self-pay | Admitting: Radiation Oncology

## 2023-06-03 ENCOUNTER — Other Ambulatory Visit: Payer: Self-pay

## 2023-06-03 ENCOUNTER — Ambulatory Visit
Admission: RE | Admit: 2023-06-03 | Discharge: 2023-06-03 | Disposition: A | Discharge status: Home / Self Care | Payer: Medicare Other | Source: Ambulatory Visit | Attending: Radiation Oncology | Admitting: Radiation Oncology

## 2023-06-03 VITALS — BP 101/30 | HR 97 | Temp 97.0°F | Resp 20 | Ht 67.0 in | Wt 243.6 lb

## 2023-06-03 DIAGNOSIS — G4730 Sleep apnea, unspecified: Secondary | ICD-10-CM | POA: Diagnosis not present

## 2023-06-03 DIAGNOSIS — Z170 Estrogen receptor positive status [ER+]: Secondary | ICD-10-CM | POA: Insufficient documentation

## 2023-06-03 DIAGNOSIS — D649 Anemia, unspecified: Secondary | ICD-10-CM | POA: Diagnosis not present

## 2023-06-03 DIAGNOSIS — Z801 Family history of malignant neoplasm of trachea, bronchus and lung: Secondary | ICD-10-CM | POA: Insufficient documentation

## 2023-06-03 DIAGNOSIS — E785 Hyperlipidemia, unspecified: Secondary | ICD-10-CM | POA: Diagnosis not present

## 2023-06-03 DIAGNOSIS — E213 Hyperparathyroidism, unspecified: Secondary | ICD-10-CM | POA: Diagnosis not present

## 2023-06-03 DIAGNOSIS — M129 Arthropathy, unspecified: Secondary | ICD-10-CM | POA: Diagnosis not present

## 2023-06-03 DIAGNOSIS — N186 End stage renal disease: Secondary | ICD-10-CM | POA: Diagnosis not present

## 2023-06-03 DIAGNOSIS — I10 Essential (primary) hypertension: Secondary | ICD-10-CM | POA: Diagnosis not present

## 2023-06-03 DIAGNOSIS — C50811 Malignant neoplasm of overlapping sites of right female breast: Secondary | ICD-10-CM

## 2023-06-03 DIAGNOSIS — E1122 Type 2 diabetes mellitus with diabetic chronic kidney disease: Secondary | ICD-10-CM | POA: Diagnosis not present

## 2023-06-03 DIAGNOSIS — Z79899 Other long term (current) drug therapy: Secondary | ICD-10-CM | POA: Insufficient documentation

## 2023-06-03 DIAGNOSIS — I480 Paroxysmal atrial fibrillation: Secondary | ICD-10-CM | POA: Diagnosis not present

## 2023-06-03 DIAGNOSIS — Z992 Dependence on renal dialysis: Secondary | ICD-10-CM | POA: Diagnosis not present

## 2023-06-03 DIAGNOSIS — E559 Vitamin D deficiency, unspecified: Secondary | ICD-10-CM | POA: Insufficient documentation

## 2023-06-03 DIAGNOSIS — I959 Hypotension, unspecified: Secondary | ICD-10-CM | POA: Diagnosis not present

## 2023-06-03 DIAGNOSIS — Z86718 Personal history of other venous thrombosis and embolism: Secondary | ICD-10-CM | POA: Diagnosis not present

## 2023-06-03 NOTE — Progress Notes (Signed)
Nursing interview for Malignant neoplasm of overlapping sites of right breast in female, estrogen receptor positive (HCC). Patient identity verified x2.  Patient reports doing well. No issues or discomfort conveyed at this time.  Meaningful use complete.  Vitals- BP (!) 101/30 (BP Location: Right Arm, Patient Position: Sitting, Cuff Size: Large)   Pulse 97   Temp (!) 97 F (36.1 C) (Temporal)   Resp 20   Ht 5\' 7"  (1.702 m)   Wt 243 lb 9.6 oz (110.5 kg)   SpO2 100%   BMI 38.15 kg/m   This concludes the interaction.  Ruel Favors, LPN

## 2023-06-03 NOTE — Progress Notes (Signed)
Radiation Oncology         [phone]) [phone] ________________________________  Name: Brenda Arnold        MRN: [id number]  Date of Service: 06/03/2023 DOB: 25-Feb-1956  WJ:[id number], Kasandra Knudsen, MD  Rachel Moulds, MD     REFERRING PHYSICIAN: Rachel Moulds, MD   DIAGNOSIS: The encounter diagnosis was Malignant neoplasm of overlapping sites of right breast in female, estrogen receptor positive (HCC).   HISTORY OF PRESENT ILLNESS: Brenda Arnold is a 67 y.o. female with a diagnosis of right breast cancer. The patient was noted to have screening detected mass on mammography. By ultrasound, a mass in the 6:00 position was noted measuring 1.3 cm in greatest dimension. Her right axilla was negative for adenopathy and there were two sebaceous appearing cystic lesions in her left axilla but these were not felt to be suspicious and did not require sampling. A right breast biopsy on 04/04/23 showed a grade 1 invasive ductal carcinoma that was ER positive, PR negative, and HER2 negative with a Ki 67 of 10%.   Since her last visit, the patient has undergone a right lumpectomy with Dr. Carolynne Edouard on 05/08/2023.  Final pathology showed a grade 1 invasive ductal carcinoma measuring 1.4 cm in greatest dimension with associated low-grade DCIS.  Her margins were negative for invasive disease the closest being 3 mm to the anterior surface, and negative for noninvasive disease also 3 mm from the anterior surface.  No lymph nodes were submitted.  Oncotype score was 14 and no systemic chemotherapy is recommended. She is seen to discuss adjuvant radiotherapy.    PREVIOUS RADIATION THERAPY: No   PAST MEDICAL HISTORY:  Past Medical History:  Diagnosis Date   Agatston coronary artery calcium score greater than 400    Ca score 3824 on CT 08/2021   Anemia    Arthritis    Asthma    Breast cancer (HCC) 04/04/2023   Complication of anesthesia    difficulty with getting oxygen saturation up- "patient was not aware"  Bottom  of feet burning in PACU   Constipation    because of Iron   Diabetes mellitus    not on meds   DVT (deep venous thrombosis) (HCC) 2019   post hip replacement - right   Dyslipidemia    Epistaxis 11/05/2012   ESRD (end stage renal disease) on dialysis [facility])    "MWF; [facility]" (09/15/2018)- started 02/13/2017   History of blood transfusion    HTN (hypertension)    pt denies this. She states her "BP runs low now"   Hyperparathyroidism due to renal insufficiency (HCC)    Hypotension    Obesity    s/p panniculectomy   Paroxysmal A-fib (HCC)    a. chronic coumadin;  b. 12/2009 Echo: EF 60-65%, Gr 1 DD.   PCOS (polycystic ovarian syndrome)    Pericarditis 08/2019   pericarditis with pericardial effusion, s/p right VATS/pericardial window 08/31/19   Pneumonia    Seasonal allergies    Sleep apnea    a. not using CPAP, last study  >8 yrs   Vitamin D deficiency        PAST SURGICAL HISTORY: Past Surgical History:  Procedure Laterality Date   ABDOMINAL HYSTERECTOMY     with panniculectomy   AV FISTULA PLACEMENT  09/02/2012   Procedure: ARTERIOVENOUS (AV) FISTULA CREATION;  Surgeon: Sherren Kerns, MD;  Location: [facility] OR;  Service: Vascular;  Laterality: Left;  Creation of Left Radial-Cephalic Fistula    BREAST BIOPSY  Right 04/04/2023   [place] RT BREAST BX W LOC DEV 1ST LESION IMG BX SPEC US GUIDE 04/04/2023 [facility] MAMMOGRAPHY   BREAST BIOPSY  05/06/2023   [place] RT RADIOACTIVE SEED LOC 05/06/2023 [facility] MAMMOGRAPHY   BREAST LUMPECTOMY WITH RADIOACTIVE SEED LOCALIZATION Right 05/08/2023   Procedure: RIGHT BREAST LUMPECTOMY WITH RADIOACTIVE SEED LOCALIZATION;  Surgeon: Griselda Miner, MD;  Location: [facility] OR;  Service: General;  Laterality: Right;   COLONOSCOPY     COLONOSCOPY W/ BIOPSIES AND POLYPECTOMY     DILATION AND CURETTAGE OF UTERUS     KNEE ARTHROSCOPY Right    PANNICULECTOMY     REVISON OF ARTERIOVENOUS FISTULA Left 04/27/2014   Procedure: REVISON OF LEFT RADIAL-CEPHALIC ARTERIOVENOUS  FISTULA;  Surgeon: Pryor Ochoa, MD;  Location: [facility] OR;  Service: Vascular;  Laterality: Left;   REVISON OF ARTERIOVENOUS FISTULA Left 01/13/2020   Procedure: REVISON OF ARTERIOVENOUS FISTULA;  Surgeon: Cephus Shelling, MD;  Location: [facility] OR;  Service: Vascular;  Laterality: Left;   TOTAL HIP ARTHROPLASTY Right 09/15/2018   TOTAL HIP ARTHROPLASTY Right 09/15/2018   Procedure: RIGHT TOTAL HIP ARTHROPLASTY ANTERIOR APPROACH;  Surgeon: Tarry Kos, MD;  Location: [facility] OR;  Service: Orthopedics;  Laterality: Right;   TOTAL HIP ARTHROPLASTY Left 11/23/2020   Procedure: LEFT TOTAL HIP ARTHROPLASTY ANTERIOR APPROACH;  Surgeon: Tarry Kos, MD;  Location: [facility] OR;  Service: Orthopedics;  Laterality: Left;  NEEDS RNFA PLEASE   UVULOPLASTY     VIDEO ASSISTED THORACOSCOPY (VATS)/WEDGE RESECTION Right 08/31/2019   Procedure: VIDEO ASSISTED THORACOSCOPY/ DRAINAGE OF PERICARDIAL EFFUSION/ PERICARDIAL WINDOW/ ABORTED PERICARDIOCENTESIS ;  Surgeon: Corliss Skains, MD;  Location: [facility] OR;  Service: Thoracic;  Laterality: Right;     FAMILY HISTORY:  Family History  Problem Relation Age of Onset   Lung cancer Father        died @ 41   Hypertension Mother    Diabetes Brother    Kidney disease Brother      SOCIAL HISTORY:  reports that she has never smoked. She has never used smokeless tobacco. She reports that she does not drink alcohol and does not use drugs. The patient is single and lives in [place]. 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    ALLERGIES: Iodine, Shellfish allergy, Shellfish-derived products, Amlodipine, Felodipine, Lisinopril, and Chlorhexidine   MEDICATIONS:  Current Outpatient Medications  Medication Sig Dispense Refill   acetaminophen (TYLENOL) 500 MG tablet Take 2 tablets (1,000 mg total) by mouth every 8 (eight) hours as needed for mild pain.     amiodarone (PACERONE) 200 MG tablet Take 1 tablet (200 mg total) by mouth daily. 90 tablet 3    atorvastatin (LIPITOR) 40 MG tablet Take 1 tablet (40 mg total) by mouth daily. 90 tablet 3   B Complex-C-Folic Acid (DIALYVITE TABLET) TABS Take 1 tablet by mouth daily.     bisacodyl (DULCOLAX) 5 MG EC tablet Take 1 tablet (5 mg total) by mouth daily as needed for moderate constipation.     colchicine 0.6 MG tablet TAKE 0.5 TABLETS (0.3 MG TOTAL) BY MOUTH EVERY MONDAY, WEDNESDAY, AND FRIDAY. 45 tablet 3   ELIQUIS 5 MG TABS tablet Take 1 tablet (5 mg total) by mouth 2 (two) times daily. 180 tablet 3   ethyl chloride spray Apply 1 application  topically every Monday, Wednesday, and Friday with hemodialysis.     fluticasone (FLONASE) 50 MCG/ACT nasal spray Place 2 sprays into both nostrils daily. (Patient taking differently:  Place 2 sprays into both nostrils daily as needed for allergies.) 9.9 mL 0   gabapentin (NEURONTIN) 100 MG capsule Take 100 mg by mouth at bedtime.  3   loratadine (CLARITIN) 10 MG tablet Take 1 tablet (10 mg total) by mouth daily. (Patient taking differently: Take 10 mg by mouth daily as needed for allergies.) 30 tablet 0   methocarbamol (ROBAXIN) 500 MG tablet Take 1 tablet (500 mg total) by mouth every 8 (eight) hours as needed for muscle spasms. (Patient not taking: Reported on 05/01/2023) 30 tablet 0   midodrine (PROAMATINE) 10 MG tablet Take 1 tablet (10 mg total) by mouth 3 (three) times daily with meals.     oxyCODONE (ROXICODONE) 5 MG immediate release tablet Take 1 tablet (5 mg total) by mouth every 6 (six) hours as needed for severe pain. 10 tablet 0   sucroferric oxyhydroxide (VELPHORO) 500 MG chewable tablet Chew 2 tablets (1,000 mg total) by mouth 3 (three) times daily with meals. 180 tablet 0   No current facility-administered medications for this encounter.     REVIEW OF SYSTEMS: On review of systems, the patient states she's been feeling well. She denies any concerns about her breast incision, or her postoperative recovery. She reports she is not dizzy or light  headed. She did well yesterday with her Dialysis, and is followed with Dr. Marisue Humble and the PAs at [facility]. No other complaints are verbalized.      PHYSICAL EXAM:  Wt Readings from Last 3 Encounters:  06/03/23 243 lb 9.6 oz (110.5 kg)  05/09/23 239 lb 6.7 oz (108.6 kg)  05/06/23 243 lb 9.6 oz (110.5 kg)   Temp Readings from Last 3 Encounters:  06/03/23 (!) 97 F (36.1 C) (Temporal)  05/09/23 98.7 F (37.1 C) (Oral)  05/06/23 98.1 F (36.7 C) (Temporal)   BP Readings from Last 3 Encounters:  06/03/23 (!) 101/30  05/09/23 (S) (!) 97/40  05/06/23 (!) 120/57   Pulse Readings from Last 3 Encounters:  06/03/23 97  05/09/23 70  05/06/23 80   Manual BP 90/60  In general this is a well appearing African American female in no acute distress. She's alert and oriented x4 and appropriate throughout the examination. Cardiopulmonary assessment is negative for acute distress and she exhibits normal effort.  Her right breast reveals a well-healed surgical incision site without erythema, separation or drainage.    ECOG = 0  0 - Asymptomatic (Fully active, able to carry on all predisease activities without restriction)  1 - Symptomatic but completely ambulatory (Restricted in physically strenuous activity but ambulatory and able to carry out work of a light or sedentary nature. For example, light housework, office work)  2 - Symptomatic, <50% in bed during the day (Ambulatory and capable of all self care but unable to carry out any work activities. Up and about more than 50% of waking hours)  3 - Symptomatic, >50% in bed, but not bedbound (Capable of only limited self-care, confined to bed or chair 50% or more of waking hours)  4 - Bedbound (Completely disabled. Cannot carry on any self-care. Totally confined to bed or chair)  5 - Death   Santiago Glad MM, Creech RH, Tormey DC, et al. [phone]). "Toxicity and response criteria of the [facility] Group". Am. Evlyn Clines. Oncol. 5 (6): 649-55    LABORATORY DATA:  Lab Results  Component Value Date   WBC 13.8 (H) 05/09/2023   HGB 10.1 (L) 05/09/2023   HCT  30.8 (L) 05/09/2023   MCV 98.1 05/09/2023   PLT 189 05/09/2023   Lab Results  Component Value Date   NA 138 05/09/2023   K 3.5 05/09/2023   CL 95 (L) 05/09/2023   CO2 27 05/09/2023   Lab Results  Component Value Date   ALT 22 03/18/2021   AST 23 03/18/2021   ALKPHOS 241 (H) 03/18/2021   BILITOT 0.5 03/18/2021      RADIOGRAPHY: MM Breast Surgical Specimen  Result Date: 05/08/2023 CLINICAL DATA:  67 year old with biopsy-proven invasive ductal carcinoma involving the lower RIGHT breast. Radioactive seed localization was performed 2 days ago in anticipation of today's lumpectomy. EXAM: SPECIMEN RADIOGRAPH OF THE RIGHT BREAST COMPARISON:  Previous exam(s). FINDINGS: Status post excision of the RIGHT breast. The radioactive seed and the coil shaped biopsy marker clip are present in the specimen. The seed is intact. The spiculated mass is present in the specimen as noted on the tomosynthesis images. This was discussed directly with the operating room nurse at the time of interpretation on 05/08/2023 at 12:31 p.m. IMPRESSION: Specimen radiograph of the RIGHT breast. Electronically Signed   By: Hulan Saas M.D.   On: 05/08/2023 12:34  MM CLIP PLACEMENT RIGHT  Result Date: 05/06/2023 CLINICAL DATA:  67 year old female presents for radioactive seed localization of RIGHT breast cancer prior to lumpectomy EXAM: ULTRASOUND GUIDED RADIOACTIVE SEED LOCALIZATION OF THE RIGHT BREAST DIAGNOSTIC RIGHT MAMMOGRAM POST ULTRASOUND-GUIDED RADIOACTIVE SEED PLACEMENT COMPARISON:  Previous exam(s). FINDINGS: Patient presents for radioactive seed localization prior to RIGHT lumpectomy. I met with the patient and we discussed the procedure of seed localization including benefits and alternatives. We discussed the high likelihood of a successful procedure. We discussed  the risks of the procedure including infection, bleeding, tissue injury and further surgery. We discussed the low dose of radioactivity involved in the procedure. Informed, written consent was given. The usual time-out protocol was performed immediately prior to the procedure. ULTRASOUND GUIDED RADIOACTIVE SEED LOCALIZATION OF THE RIGHT BREAST Using ultrasound guidance, sterile technique, 1% lidocaine and an I-125 radioactive seed, the 1.3 cm mass at the 6 o'clock position of the RIGHT breast 1 cm from the nipple was localized using a LATERAL approach. The follow-up mammogram images confirm the seed in the expected location and were marked for Dr. Carolynne Edouard. Follow-up survey of the patient confirms presence of the radioactive seed. Order number of I-125 seed:  [id number]. Total activity:  0.270 millicuries.  Reference Date: 04/14/2023 DIAGNOSTIC RIGHT MAMMOGRAM POST ULTRASOUND-GUIDED RADIOACTIVE SEED PLACEMENT Mammographic images were obtained following ultrasound-guided radioactive seed placement. These demonstrate the seed immediately adjacent to the mass and COIL biopsy clip. The patient tolerated the procedure well and was released from the Breast Center. She was given instructions regarding seed removal. IMPRESSION: Radioactive seed localization of the RIGHT breast. No apparent complications. Electronically Signed   By: Harmon Pier M.D.   On: 05/06/2023 09:21  [place] RT RADIOACTIVE SEED LOC  Result Date: 05/06/2023 CLINICAL DATA:  67 year old female presents for radioactive seed localization of RIGHT breast cancer prior to lumpectomy EXAM: ULTRASOUND GUIDED RADIOACTIVE SEED LOCALIZATION OF THE RIGHT BREAST DIAGNOSTIC RIGHT MAMMOGRAM POST ULTRASOUND-GUIDED RADIOACTIVE SEED PLACEMENT COMPARISON:  Previous exam(s). FINDINGS: Patient presents for radioactive seed localization prior to RIGHT lumpectomy. I met with the patient and we discussed the procedure of seed localization including benefits and alternatives. We  discussed the high likelihood of a successful procedure. We discussed the risks of the procedure including infection, bleeding, tissue injury and further surgery. We discussed  the low dose of radioactivity involved in the procedure. Informed, written consent was given. The usual time-out protocol was performed immediately prior to the procedure. ULTRASOUND GUIDED RADIOACTIVE SEED LOCALIZATION OF THE RIGHT BREAST Using ultrasound guidance, sterile technique, 1% lidocaine and an I-125 radioactive seed, the 1.3 cm mass at the 6 o'clock position of the RIGHT breast 1 cm from the nipple was localized using a LATERAL approach. The follow-up mammogram images confirm the seed in the expected location and were marked for Dr. Carolynne Edouard. Follow-up survey of the patient confirms presence of the radioactive seed. Order number of I-125 seed:  [id number]. Total activity:  0.270 millicuries.  Reference Date: 04/14/2023 DIAGNOSTIC RIGHT MAMMOGRAM POST ULTRASOUND-GUIDED RADIOACTIVE SEED PLACEMENT Mammographic images were obtained following ultrasound-guided radioactive seed placement. These demonstrate the seed immediately adjacent to the mass and COIL biopsy clip. The patient tolerated the procedure well and was released from the Breast Center. She was given instructions regarding seed removal. IMPRESSION: Radioactive seed localization of the RIGHT breast. No apparent complications. Electronically Signed   By: Harmon Pier M.D.   On: 05/06/2023 09:21      IMPRESSION/PLAN: 1. Stage IA, pT1c, cN0M0, grade 1, ER positive invasive ductal carcinoma of the right breast. Dr. Mitzi Hansen has reviewed her final pathology findings and today she and I discussed the nature of right breast disease.  She has done well since surgery.  She continues her dialysis without any delays.  Based on her Oncotype DX score she does not need systemic chemotherapy.  Dr. Mitzi Hansen would however recommend external radiotherapy to the breast  to reduce risks of local  recurrence followed by antiestrogen therapy. We discussed the risks, benefits, short, and long term effects of radiotherapy, as well as the curative intent, and the patient is interested in proceeding.  We reviewed the delivery and logistics of radiotherapy and Dr. Mitzi Hansen recommends 4 weeks of radiotherapy to the right breast. Written consent is obtained and placed in the chart, a copy was provided to the patient. The patient will be contacted to coordinate treatment planning by our simulation department.   2. End Stage Renal Disease on Hemodialysis. The patient is cared for by Dr. Marisue Humble and his team at [facility], and receives her dialysis on M/W/F in the early morning. We will try to coordinate radiotherapy around her other appointments.   3. Hypotension. The patient is hypotensive at baseline secondary to #2. I will forward my note to her Nephrologist but repeating her BP, her diastolic was higher with a manual reading.  In a visit lasting 45 minutes, greater than 50% of the time was spent face to face reviewing her case, as well as in preparation of, discussing, and coordinating the patient's care.    Osker Mason, [facility]    **Disclaimer: This note was dictated with voice recognition software. Similar sounding words can inadvertently be transcribed and this note may contain transcription errors which may not have been corrected upon publication of note.**

## 2023-06-04 ENCOUNTER — Inpatient Hospital Stay: Payer: Medicare Other | Attending: Hematology and Oncology | Admitting: Hematology and Oncology

## 2023-06-04 VITALS — BP 95/46 | HR 94 | Temp 97.7°F | Resp 18 | Wt 238.4 lb

## 2023-06-04 DIAGNOSIS — E1122 Type 2 diabetes mellitus with diabetic chronic kidney disease: Secondary | ICD-10-CM | POA: Diagnosis not present

## 2023-06-04 DIAGNOSIS — Z170 Estrogen receptor positive status [ER+]: Secondary | ICD-10-CM | POA: Diagnosis not present

## 2023-06-04 DIAGNOSIS — N186 End stage renal disease: Secondary | ICD-10-CM | POA: Diagnosis not present

## 2023-06-04 DIAGNOSIS — Z7901 Long term (current) use of anticoagulants: Secondary | ICD-10-CM | POA: Diagnosis not present

## 2023-06-04 DIAGNOSIS — Z86718 Personal history of other venous thrombosis and embolism: Secondary | ICD-10-CM | POA: Diagnosis not present

## 2023-06-04 DIAGNOSIS — Z79899 Other long term (current) drug therapy: Secondary | ICD-10-CM | POA: Diagnosis not present

## 2023-06-04 DIAGNOSIS — D631 Anemia in chronic kidney disease: Secondary | ICD-10-CM | POA: Diagnosis not present

## 2023-06-04 DIAGNOSIS — N2581 Secondary hyperparathyroidism of renal origin: Secondary | ICD-10-CM | POA: Diagnosis not present

## 2023-06-04 DIAGNOSIS — C50811 Malignant neoplasm of overlapping sites of right female breast: Secondary | ICD-10-CM | POA: Insufficient documentation

## 2023-06-04 DIAGNOSIS — I120 Hypertensive chronic kidney disease with stage 5 chronic kidney disease or end stage renal disease: Secondary | ICD-10-CM | POA: Insufficient documentation

## 2023-06-04 DIAGNOSIS — Z992 Dependence on renal dialysis: Secondary | ICD-10-CM | POA: Diagnosis not present

## 2023-06-04 NOTE — Progress Notes (Signed)
[facility] CONSULT NOTE  Patient Care Team: [name], MD as PCP - General (Internal Medicine) Quintella Reichert, MD as PCP - Cardiology (Cardiology) Camille Bal, MD (Nephrology) Pershing Proud, RN as Oncology Nurse Navigator Donnelly Angelica, RN as Oncology Nurse Navigator Rachel Moulds, MD as Consulting Physician (Hematology and Oncology)  CHIEF COMPLAINTS/PURPOSE OF CONSULTATION:  Newly diagnosed breast cancer  HISTORY OF PRESENTING ILLNESS:  Brenda Arnold 67 y.o. female is here because of recent diagnosis of right breast cancer  I reviewed her records extensively and collaborated the history with the patient.  SUMMARY OF ONCOLOGIC HISTORY: Oncology History  Malignant neoplasm of overlapping sites of right female breast (HCC)  03/21/2023 Mammogram   Diagnostic mammogram showed suspicious mass in the right breast at 6:00 measuring 1.3 cm, at the palpable site of concern in the left axilla there benign sebaceous or epidermal inclusion cyst.  No mammographic evidence of malignancy in the left breast.   04/04/2023 Pathology Results   Right breast needle core biopsy showed invasive ductal carcinoma overall grade 1, prognostic showed ER 100% positive strong staining PR negative HER2 0 and Ki-67 of 10%   04/24/2023 Initial Diagnosis   Malignant neoplasm of overlapping sites of right female breast (HCC)   04/24/2023 Cancer Staging   Staging form: Breast, AJCC 8th Edition - Clinical stage from 04/24/2023: Stage IA (cT1c, cN0, cM0, G1, ER+, PR-, HER2-) - Signed by Ronny Bacon, PA-C on 04/24/2023 Stage prefix: Initial diagnosis Method of lymph node assessment: Clinical Histologic grading system: 3 grade system    She is here for follow up. She denies any new complaints at all. She says surgery went well. It was very easy she says. She is excited about oncotype results. She is scheduled to follow up with Dr Mitzi Hansen on July 9 th.  Rest of the  pertinent 10 point ROS reviewed and neg.   MEDICAL HISTORY:  Past Medical History:  Diagnosis Date   Agatston coronary artery calcium score greater than 400    Ca score 3824 on CT 08/2021   Anemia    Arthritis    Asthma    Breast cancer (HCC) 04/04/2023   Complication of anesthesia    difficulty with getting oxygen saturation up- "patient was not aware"  Bottom of feet burning in PACU   Constipation    because of Iron   Diabetes mellitus    not on meds   DVT (deep venous thrombosis) (HCC) 2019   post hip replacement - right   Dyslipidemia    Epistaxis 11/05/2012   ESRD (end stage renal disease) on dialysis [facility])    "MWF; [facility] Anda" (09/15/2018)- started 02/13/2017   History of blood transfusion    HTN (hypertension)    pt denies this. She states her "BP runs low now"   Hyperparathyroidism due to renal insufficiency (HCC)    Hypotension    Obesity    s/p panniculectomy   Paroxysmal A-fib (HCC)    a. chronic coumadin;  b. 12/2009 Echo: EF 60-65%, Gr 1 DD.   PCOS (polycystic ovarian syndrome)    Pericarditis 08/2019   pericarditis with pericardial effusion, s/p right VATS/pericardial window 08/31/19   Pneumonia    Seasonal allergies    Sleep apnea    a. not using CPAP, last study  >8 yrs   Vitamin D deficiency     SURGICAL HISTORY: Past Surgical History:  Procedure Laterality Date   ABDOMINAL HYSTERECTOMY  with panniculectomy   AV FISTULA PLACEMENT  09/02/2012   Procedure: ARTERIOVENOUS (AV) FISTULA CREATION;  Surgeon: Sherren Kerns, MD;  Location: [facility] OR;  Service: Vascular;  Laterality: Left;  Creation of Left Radial-Cephalic Fistula    BREAST BIOPSY Right 04/04/2023   [place] RT BREAST BX W LOC DEV 1ST LESION IMG BX SPEC US GUIDE 04/04/2023 [facility] MAMMOGRAPHY   BREAST BIOPSY  05/06/2023   [place] RT RADIOACTIVE SEED LOC 05/06/2023 [facility] MAMMOGRAPHY   BREAST LUMPECTOMY WITH RADIOACTIVE SEED LOCALIZATION Right 05/08/2023   Procedure: RIGHT BREAST LUMPECTOMY WITH RADIOACTIVE  SEED LOCALIZATION;  Surgeon: Griselda Miner, MD;  Location: [facility] OR;  Service: General;  Laterality: Right;   COLONOSCOPY     COLONOSCOPY W/ BIOPSIES AND POLYPECTOMY     DILATION AND CURETTAGE OF UTERUS     KNEE ARTHROSCOPY Right    PANNICULECTOMY     REVISON OF ARTERIOVENOUS FISTULA Left 04/27/2014   Procedure: REVISON OF LEFT RADIAL-CEPHALIC ARTERIOVENOUS FISTULA;  Surgeon: Pryor Ochoa, MD;  Location[facility] OR;  Service: Vascular;  Laterality: Left;   REVISON OF ARTERIOVENOUS FISTULA Left 01/13/2020   Procedure: REVISON OF ARTERIOVENOUS FISTULA;  Surgeon: Cephus Shelling, MD;  Location: [facility] OR;  Service: Vascular;  Laterality: Left;   TOTAL HIP ARTHROPLASTY Right 09/15/2018   TOTAL HIP ARTHROPLASTY Right 09/15/2018   Procedure: RIGHT TOTAL HIP ARTHROPLASTY ANTERIOR APPROACH;  Surgeon: Tarry Kos, MD;  Location: [facility] OR;  Service: Orthopedics;  Laterality: Right;   TOTAL HIP ARTHROPLASTY Left 11/23/2020   Procedure: LEFT TOTAL HIP ARTHROPLASTY ANTERIOR APPROACH;  Surgeon: Tarry Kos, MD;  Location: [facility] OR;  Service: Orthopedics;  Laterality: Left;  NEEDS RNFA PLEASE   UVULOPLASTY     VIDEO ASSISTED THORACOSCOPY (VATS)/WEDGE RESECTION Right 08/31/2019   Procedure: VIDEO ASSISTED THORACOSCOPY/ DRAINAGE OF PERICARDIAL EFFUSION/ PERICARDIAL WINDOW/ ABORTED PERICARDIOCENTESIS ;  Surgeon: Corliss Skains, MD;  Location: [facility] OR;  Service: Thoracic;  Laterality: Right;    SOCIAL HISTORY: Social History   Socioeconomic History   Marital status: Single    Spouse name: Not on file   Number of children: 0   Years of education: Not on file   Highest education level: Not on file  Occupational History   Occupation: CORRESPONDENT ADMIN.    Employer: [employer]  Tobacco Use   Smoking status: Never   Smokeless tobacco: Never  Vaping Use   Vaping Use: Never used  Substance and Sexual Activity   Alcohol use: No    Alcohol/week: 0.0 standard drinks of alcohol   Drug use: No    Sexual activity: Not on file    Comment: Hysterectomy  Other Topics Concern   Not on file  Social History Narrative   Lives in [place] alone. 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   Social Determinants of Health   Financial Resource Strain: Not on file  Food Insecurity: No Food Insecurity (05/08/2023)   Hunger Vital Sign    Worried About Running Out of Food in the Last Year: Never true    Ran Out of Food in the Last Year: Never true  Transportation Needs: No Transportation Needs (05/08/2023)   PRAPARE - Administrator, Civil Service (Medical): No    Lack of Transportation (Non-Medical): No  Physical Activity: Not on file  Stress: Not on file  Social Connections: Not on file  Intimate Partner Violence: Not At Risk (05/08/2023)   Humiliation, Afraid, Rape, and Kick questionnaire  Fear of Current or Ex-Partner: No    Emotionally Abused: No    Physically Abused: No    Sexually Abused: No    FAMILY HISTORY: Family History  Problem Relation Age of Onset   Lung cancer Father        died @ 63   Hypertension Mother    Diabetes Brother    Kidney disease Brother     ALLERGIES:  is allergic to iodine, shellfish allergy, shellfish-derived products, amlodipine, felodipine, lisinopril, and chlorhexidine.  MEDICATIONS:  Current Outpatient Medications  Medication Sig Dispense Refill   acetaminophen (TYLENOL) 500 MG tablet Take 2 tablets (1,000 mg total) by mouth every 8 (eight) hours as needed for mild pain.     amiodarone (PACERONE) 200 MG tablet Take 1 tablet (200 mg total) by mouth daily. 90 tablet 3   atorvastatin (LIPITOR) 40 MG tablet Take 1 tablet (40 mg total) by mouth daily. 90 tablet 3   B Complex-C-Folic Acid (DIALYVITE TABLET) TABS Take 1 tablet by mouth daily.     bisacodyl (DULCOLAX) 5 MG EC tablet Take 1 tablet (5 mg total) by mouth daily as needed for moderate constipation.     colchicine 0.6 MG tablet TAKE 0.5 TABLETS (0.3 MG TOTAL) BY  MOUTH EVERY MONDAY, WEDNESDAY, AND FRIDAY. 45 tablet 3   ELIQUIS 5 MG TABS tablet Take 1 tablet (5 mg total) by mouth 2 (two) times daily. 180 tablet 3   ethyl chloride spray Apply 1 application  topically every Monday, Wednesday, and Friday with hemodialysis.     fluticasone (FLONASE) 50 MCG/ACT nasal spray Place 2 sprays into both nostrils daily. (Patient taking differently: Place 2 sprays into both nostrils daily as needed for allergies.) 9.9 mL 0   gabapentin (NEURONTIN) 100 MG capsule Take 100 mg by mouth at bedtime.  3   loratadine (CLARITIN) 10 MG tablet Take 1 tablet (10 mg total) by mouth daily. (Patient taking differently: Take 10 mg by mouth daily as needed for allergies.) 30 tablet 0   methocarbamol (ROBAXIN) 500 MG tablet Take 1 tablet (500 mg total) by mouth every 8 (eight) hours as needed for muscle spasms. (Patient not taking: Reported on 05/01/2023) 30 tablet 0   midodrine (PROAMATINE) 10 MG tablet Take 1 tablet (10 mg total) by mouth 3 (three) times daily with meals.     oxyCODONE (ROXICODONE) 5 MG immediate release tablet Take 1 tablet (5 mg total) by mouth every 6 (six) hours as needed for severe pain. 10 tablet 0   sucroferric oxyhydroxide (VELPHORO) 500 MG chewable tablet Chew 2 tablets (1,000 mg total) by mouth 3 (three) times daily with meals. 180 tablet 0   No current facility-administered medications for this visit.    REVIEW OF SYSTEMS:   Constitutional: Denies fevers, chills or abnormal night sweats Eyes: Denies blurriness of vision, double vision or watery eyes Ears, nose, mouth, throat, and face: Denies mucositis or sore throat Respiratory: Denies cough, dyspnea or wheezes Cardiovascular: Denies palpitation, chest discomfort or lower extremity swelling Gastrointestinal:  Denies nausea, heartburn or change in bowel habits Skin: Denies abnormal skin rashes Lymphatics: Denies new lymphadenopathy or easy bruising Neurological:Denies numbness, tingling or new  weaknesses Behavioral/Psych: Mood is stable, no new changes  Breast: Felt left axilla lumps not the right breast. All other systems were reviewed with the patient and are negative.  PHYSICAL EXAMINATION: ECOG PERFORMANCE STATUS: 1 - Symptomatic but completely ambulatory  Vitals:   06/04/23 1613  BP: (!) 95/46  Pulse: 94  Resp: 18  Temp: 97.7 F (36.5 C)  SpO2: 98%   Filed Weights   06/04/23 1613  Weight: 238 lb 6.4 oz (108.1 kg)    GENERAL:alert, no distress and comfortable Breast: Right surgical incision has healed very well.  LABORATORY DATA:  I have reviewed the data as listed Lab Results  Component Value Date   WBC 13.8 (H) 05/09/2023   HGB 10.1 (L) 05/09/2023   HCT 30.8 (L) 05/09/2023   MCV 98.1 05/09/2023   PLT 189 05/09/2023   Lab Results  Component Value Date   NA 138 05/09/2023   K 3.5 05/09/2023   CL 95 (L) 05/09/2023   CO2 27 05/09/2023    RADIOGRAPHIC STUDIES: I have personally reviewed the radiological reports and agreed with the findings in the report.  ASSESSMENT AND PLAN:  Malignant neoplasm of overlapping sites of right female breast [facility]) This is a very pleasant 67 year old postmenopausal female patient with past medical history significant for hypertension, end-stage renal disease, A-fib on anticoagulation, history of DVT, diabetes mellitus referred to medical oncology for new diagnosis of right breast invasive ductal carcinoma.  Given small size, node-negative and strong ER staining, she will proceed with upfront surgery followed by Oncotype Dx testing.  She has completed surgery, healing very well. Oncotype dx of 14, no role for adjuvant chemotherapy. She will proceed with adjuvant radiation followed by anti estrogen therapy. We have discussed about aromatase inhibitors, mechanism of action and adverse effects including but not limited to post menopausal symptoms, hot flashes, vaginal dryness, arthralgias, bone density loss. She will come back  after completing radiation to start antiestrogen therapy.   All questions were answered. The patient knows to call the clinic with any problems, questions or concerns.    Rachel Moulds, MD 06/04/23

## 2023-06-04 NOTE — Assessment & Plan Note (Signed)
This is a very pleasant 67 year old postmenopausal female patient with past medical history significant for hypertension, end-stage renal disease, A-fib on anticoagulation, history of DVT, diabetes mellitus referred to medical oncology for new diagnosis of right breast invasive ductal carcinoma.  Given small size, node-negative and strong ER staining, she will proceed with upfront surgery followed by Oncotype Dx testing.  She has completed surgery, healing very well. Oncotype dx of 14, no role for adjuvant chemotherapy. She will proceed with adjuvant radiation followed by anti estrogen therapy. We have discussed about aromatase inhibitors, mechanism of action and adverse effects including but not limited to post menopausal symptoms, hot flashes, vaginal dryness, arthralgias, bone density loss. She will come back after completing radiation to start antiestrogen therapy.

## 2023-06-06 ENCOUNTER — Telehealth: Payer: Self-pay | Admitting: Hematology and Oncology

## 2023-06-06 DIAGNOSIS — N186 End stage renal disease: Secondary | ICD-10-CM | POA: Diagnosis not present

## 2023-06-06 DIAGNOSIS — Z992 Dependence on renal dialysis: Secondary | ICD-10-CM | POA: Diagnosis not present

## 2023-06-06 DIAGNOSIS — D631 Anemia in chronic kidney disease: Secondary | ICD-10-CM | POA: Diagnosis not present

## 2023-06-06 DIAGNOSIS — N2581 Secondary hyperparathyroidism of renal origin: Secondary | ICD-10-CM | POA: Diagnosis not present

## 2023-06-06 NOTE — Telephone Encounter (Signed)
Left patient a vm regarding upcoming appointment  

## 2023-06-09 ENCOUNTER — Encounter: Payer: Self-pay | Admitting: *Deleted

## 2023-06-09 DIAGNOSIS — D631 Anemia in chronic kidney disease: Secondary | ICD-10-CM | POA: Diagnosis not present

## 2023-06-09 DIAGNOSIS — Z992 Dependence on renal dialysis: Secondary | ICD-10-CM | POA: Diagnosis not present

## 2023-06-09 DIAGNOSIS — N2581 Secondary hyperparathyroidism of renal origin: Secondary | ICD-10-CM | POA: Diagnosis not present

## 2023-06-09 DIAGNOSIS — N186 End stage renal disease: Secondary | ICD-10-CM | POA: Diagnosis not present

## 2023-06-10 ENCOUNTER — Ambulatory Visit: Payer: Medicare Other | Admitting: Radiation Oncology

## 2023-06-10 DIAGNOSIS — Z170 Estrogen receptor positive status [ER+]: Secondary | ICD-10-CM | POA: Diagnosis not present

## 2023-06-10 DIAGNOSIS — C50811 Malignant neoplasm of overlapping sites of right female breast: Secondary | ICD-10-CM | POA: Diagnosis not present

## 2023-06-11 DIAGNOSIS — Z992 Dependence on renal dialysis: Secondary | ICD-10-CM | POA: Diagnosis not present

## 2023-06-11 DIAGNOSIS — D631 Anemia in chronic kidney disease: Secondary | ICD-10-CM | POA: Diagnosis not present

## 2023-06-11 DIAGNOSIS — N2581 Secondary hyperparathyroidism of renal origin: Secondary | ICD-10-CM | POA: Diagnosis not present

## 2023-06-11 DIAGNOSIS — E1122 Type 2 diabetes mellitus with diabetic chronic kidney disease: Secondary | ICD-10-CM | POA: Diagnosis not present

## 2023-06-11 DIAGNOSIS — N186 End stage renal disease: Secondary | ICD-10-CM | POA: Diagnosis not present

## 2023-06-12 ENCOUNTER — Ambulatory Visit
Admission: RE | Admit: 2023-06-12 | Discharge: 2023-06-12 | Disposition: A | Discharge status: Home / Self Care | Payer: Medicare Other | Source: Ambulatory Visit | Attending: Radiation Oncology | Admitting: Radiation Oncology

## 2023-06-12 ENCOUNTER — Other Ambulatory Visit: Payer: Self-pay

## 2023-06-12 DIAGNOSIS — Z510 Encounter for antineoplastic radiation therapy: Secondary | ICD-10-CM | POA: Insufficient documentation

## 2023-06-12 DIAGNOSIS — C50811 Malignant neoplasm of overlapping sites of right female breast: Secondary | ICD-10-CM | POA: Insufficient documentation

## 2023-06-12 DIAGNOSIS — Z170 Estrogen receptor positive status [ER+]: Secondary | ICD-10-CM | POA: Diagnosis not present

## 2023-06-13 DIAGNOSIS — D631 Anemia in chronic kidney disease: Secondary | ICD-10-CM | POA: Diagnosis not present

## 2023-06-13 DIAGNOSIS — Z992 Dependence on renal dialysis: Secondary | ICD-10-CM | POA: Diagnosis not present

## 2023-06-13 DIAGNOSIS — N186 End stage renal disease: Secondary | ICD-10-CM | POA: Diagnosis not present

## 2023-06-13 DIAGNOSIS — N2581 Secondary hyperparathyroidism of renal origin: Secondary | ICD-10-CM | POA: Diagnosis not present

## 2023-06-16 DIAGNOSIS — Z992 Dependence on renal dialysis: Secondary | ICD-10-CM | POA: Diagnosis not present

## 2023-06-16 DIAGNOSIS — D631 Anemia in chronic kidney disease: Secondary | ICD-10-CM | POA: Diagnosis not present

## 2023-06-16 DIAGNOSIS — N186 End stage renal disease: Secondary | ICD-10-CM | POA: Diagnosis not present

## 2023-06-16 DIAGNOSIS — N2581 Secondary hyperparathyroidism of renal origin: Secondary | ICD-10-CM | POA: Diagnosis not present

## 2023-06-17 ENCOUNTER — Encounter: Payer: Self-pay | Admitting: *Deleted

## 2023-06-17 ENCOUNTER — Ambulatory Visit: Payer: Medicare Other | Admitting: Radiation Oncology

## 2023-06-17 DIAGNOSIS — Z170 Estrogen receptor positive status [ER+]: Secondary | ICD-10-CM

## 2023-06-17 DIAGNOSIS — C50811 Malignant neoplasm of overlapping sites of right female breast: Secondary | ICD-10-CM

## 2023-06-18 ENCOUNTER — Ambulatory Visit: Payer: Medicare Other

## 2023-06-18 DIAGNOSIS — D631 Anemia in chronic kidney disease: Secondary | ICD-10-CM | POA: Diagnosis not present

## 2023-06-18 DIAGNOSIS — Z992 Dependence on renal dialysis: Secondary | ICD-10-CM | POA: Diagnosis not present

## 2023-06-18 DIAGNOSIS — N186 End stage renal disease: Secondary | ICD-10-CM | POA: Diagnosis not present

## 2023-06-18 DIAGNOSIS — Z170 Estrogen receptor positive status [ER+]: Secondary | ICD-10-CM | POA: Diagnosis not present

## 2023-06-18 DIAGNOSIS — Z510 Encounter for antineoplastic radiation therapy: Secondary | ICD-10-CM | POA: Diagnosis not present

## 2023-06-18 DIAGNOSIS — C50811 Malignant neoplasm of overlapping sites of right female breast: Secondary | ICD-10-CM | POA: Diagnosis not present

## 2023-06-18 DIAGNOSIS — N2581 Secondary hyperparathyroidism of renal origin: Secondary | ICD-10-CM | POA: Diagnosis not present

## 2023-06-19 ENCOUNTER — Ambulatory Visit
Admission: RE | Admit: 2023-06-19 | Discharge: 2023-06-19 | Disposition: A | Discharge status: Home / Self Care | Payer: Medicare Other | Source: Ambulatory Visit | Attending: Radiation Oncology | Admitting: Radiation Oncology

## 2023-06-19 ENCOUNTER — Other Ambulatory Visit: Payer: Self-pay

## 2023-06-19 ENCOUNTER — Ambulatory Visit: Payer: Medicare Other | Admitting: Radiation Oncology

## 2023-06-19 ENCOUNTER — Ambulatory Visit: Payer: Medicare Other

## 2023-06-19 DIAGNOSIS — Z510 Encounter for antineoplastic radiation therapy: Secondary | ICD-10-CM | POA: Diagnosis not present

## 2023-06-19 DIAGNOSIS — Z170 Estrogen receptor positive status [ER+]: Secondary | ICD-10-CM | POA: Diagnosis not present

## 2023-06-19 DIAGNOSIS — C50811 Malignant neoplasm of overlapping sites of right female breast: Secondary | ICD-10-CM | POA: Diagnosis not present

## 2023-06-19 LAB — RAD ONC ARIA SESSION SUMMARY
Course Elapsed Days: 0
Plan Fractions Treated to Date: 1
Plan Prescribed Dose Per Fraction: 2.66 Gy
Plan Total Fractions Prescribed: 16
Plan Total Prescribed Dose: 42.56 Gy
Reference Point Dosage Given to Date: 2.66 Gy
Reference Point Session Dosage Given: 2.66 Gy
Session Number: 1

## 2023-06-20 ENCOUNTER — Other Ambulatory Visit: Payer: Self-pay

## 2023-06-20 ENCOUNTER — Telehealth: Payer: Self-pay | Admitting: *Deleted

## 2023-06-20 ENCOUNTER — Ambulatory Visit
Admission: RE | Admit: 2023-06-20 | Discharge: 2023-06-20 | Disposition: A | Discharge status: Home / Self Care | Payer: Medicare Other | Source: Ambulatory Visit | Attending: Radiation Oncology | Admitting: Radiation Oncology

## 2023-06-20 DIAGNOSIS — C50811 Malignant neoplasm of overlapping sites of right female breast: Secondary | ICD-10-CM

## 2023-06-20 DIAGNOSIS — D631 Anemia in chronic kidney disease: Secondary | ICD-10-CM | POA: Diagnosis not present

## 2023-06-20 DIAGNOSIS — Z992 Dependence on renal dialysis: Secondary | ICD-10-CM | POA: Diagnosis not present

## 2023-06-20 DIAGNOSIS — N186 End stage renal disease: Secondary | ICD-10-CM | POA: Diagnosis not present

## 2023-06-20 DIAGNOSIS — Z170 Estrogen receptor positive status [ER+]: Secondary | ICD-10-CM | POA: Diagnosis not present

## 2023-06-20 DIAGNOSIS — N2581 Secondary hyperparathyroidism of renal origin: Secondary | ICD-10-CM | POA: Diagnosis not present

## 2023-06-20 DIAGNOSIS — Z510 Encounter for antineoplastic radiation therapy: Secondary | ICD-10-CM | POA: Diagnosis not present

## 2023-06-20 LAB — RAD ONC ARIA SESSION SUMMARY
Course Elapsed Days: 1
Plan Fractions Treated to Date: 2
Plan Prescribed Dose Per Fraction: 2.66 Gy
Plan Total Fractions Prescribed: 16
Plan Total Prescribed Dose: 42.56 Gy
Reference Point Dosage Given to Date: 5.32 Gy
Reference Point Session Dosage Given: 2.66 Gy
Session Number: 2

## 2023-06-20 MED ORDER — ALRA NON-METALLIC DEODORANT (RAD-ONC)
1.0000 | Freq: Once | TOPICAL | Status: AC
  Administered 2023-06-20: 1 via TOPICAL

## 2023-06-20 MED ORDER — RADIAPLEXRX EX GEL: Freq: Once | CUTANEOUS | Status: AC

## 2023-06-20 NOTE — Telephone Encounter (Signed)
FMLA form prepared by this nurse currently placed to radiation door in-basket of PA of assigned provider to review, amend, sign and return to this nurse to finish process by returning  to claim benefit administrator and/or patient.

## 2023-06-23 ENCOUNTER — Other Ambulatory Visit: Payer: Self-pay

## 2023-06-23 ENCOUNTER — Ambulatory Visit
Admission: RE | Admit: 2023-06-23 | Discharge: 2023-06-23 | Disposition: A | Discharge status: Home / Self Care | Payer: Medicare Other | Source: Ambulatory Visit | Attending: Radiation Oncology | Admitting: Radiation Oncology

## 2023-06-23 DIAGNOSIS — C50811 Malignant neoplasm of overlapping sites of right female breast: Secondary | ICD-10-CM | POA: Diagnosis not present

## 2023-06-23 DIAGNOSIS — Z170 Estrogen receptor positive status [ER+]: Secondary | ICD-10-CM | POA: Diagnosis not present

## 2023-06-23 DIAGNOSIS — N186 End stage renal disease: Secondary | ICD-10-CM | POA: Diagnosis not present

## 2023-06-23 DIAGNOSIS — Z992 Dependence on renal dialysis: Secondary | ICD-10-CM | POA: Diagnosis not present

## 2023-06-23 DIAGNOSIS — Z510 Encounter for antineoplastic radiation therapy: Secondary | ICD-10-CM | POA: Diagnosis not present

## 2023-06-23 DIAGNOSIS — N2581 Secondary hyperparathyroidism of renal origin: Secondary | ICD-10-CM | POA: Diagnosis not present

## 2023-06-23 DIAGNOSIS — D631 Anemia in chronic kidney disease: Secondary | ICD-10-CM | POA: Diagnosis not present

## 2023-06-23 LAB — RAD ONC ARIA SESSION SUMMARY
Course Elapsed Days: 4
Plan Fractions Treated to Date: 3
Plan Prescribed Dose Per Fraction: 2.66 Gy
Plan Total Fractions Prescribed: 16
Plan Total Prescribed Dose: 42.56 Gy
Reference Point Dosage Given to Date: 7.98 Gy
Reference Point Session Dosage Given: 2.66 Gy
Session Number: 3

## 2023-06-24 ENCOUNTER — Other Ambulatory Visit: Payer: Self-pay

## 2023-06-24 ENCOUNTER — Ambulatory Visit
Admission: RE | Admit: 2023-06-24 | Discharge: 2023-06-24 | Disposition: A | Discharge status: Home / Self Care | Payer: Medicare Other | Source: Ambulatory Visit | Attending: Radiation Oncology | Admitting: Radiation Oncology

## 2023-06-24 DIAGNOSIS — C50811 Malignant neoplasm of overlapping sites of right female breast: Secondary | ICD-10-CM | POA: Diagnosis not present

## 2023-06-24 DIAGNOSIS — Z510 Encounter for antineoplastic radiation therapy: Secondary | ICD-10-CM | POA: Diagnosis not present

## 2023-06-24 DIAGNOSIS — Z170 Estrogen receptor positive status [ER+]: Secondary | ICD-10-CM | POA: Diagnosis not present

## 2023-06-24 LAB — RAD ONC ARIA SESSION SUMMARY
Course Elapsed Days: 5
Plan Fractions Treated to Date: 4
Plan Prescribed Dose Per Fraction: 2.66 Gy
Plan Total Fractions Prescribed: 16
Plan Total Prescribed Dose: 42.56 Gy
Reference Point Dosage Given to Date: 10.64 Gy
Reference Point Session Dosage Given: 2.66 Gy
Session Number: 4

## 2023-06-25 ENCOUNTER — Other Ambulatory Visit: Payer: Self-pay

## 2023-06-25 ENCOUNTER — Ambulatory Visit
Admission: RE | Admit: 2023-06-25 | Discharge: 2023-06-25 | Disposition: A | Discharge status: Home / Self Care | Payer: Medicare Other | Source: Ambulatory Visit | Attending: Radiation Oncology | Admitting: Radiation Oncology

## 2023-06-25 DIAGNOSIS — N2581 Secondary hyperparathyroidism of renal origin: Secondary | ICD-10-CM | POA: Diagnosis not present

## 2023-06-25 DIAGNOSIS — Z170 Estrogen receptor positive status [ER+]: Secondary | ICD-10-CM | POA: Diagnosis not present

## 2023-06-25 DIAGNOSIS — C50811 Malignant neoplasm of overlapping sites of right female breast: Secondary | ICD-10-CM | POA: Diagnosis not present

## 2023-06-25 DIAGNOSIS — D631 Anemia in chronic kidney disease: Secondary | ICD-10-CM | POA: Diagnosis not present

## 2023-06-25 DIAGNOSIS — Z510 Encounter for antineoplastic radiation therapy: Secondary | ICD-10-CM | POA: Diagnosis not present

## 2023-06-25 DIAGNOSIS — N186 End stage renal disease: Secondary | ICD-10-CM | POA: Diagnosis not present

## 2023-06-25 DIAGNOSIS — Z992 Dependence on renal dialysis: Secondary | ICD-10-CM | POA: Diagnosis not present

## 2023-06-25 LAB — RAD ONC ARIA SESSION SUMMARY
Course Elapsed Days: 6
Plan Fractions Treated to Date: 5
Plan Prescribed Dose Per Fraction: 2.66 Gy
Plan Total Fractions Prescribed: 16
Plan Total Prescribed Dose: 42.56 Gy
Reference Point Dosage Given to Date: 13.3 Gy
Reference Point Session Dosage Given: 2.66 Gy
Session Number: 5

## 2023-06-26 ENCOUNTER — Ambulatory Visit
Admission: RE | Admit: 2023-06-26 | Discharge: 2023-06-26 | Disposition: A | Discharge status: Home / Self Care | Payer: Medicare Other | Source: Ambulatory Visit | Attending: Radiation Oncology | Admitting: Radiation Oncology

## 2023-06-26 ENCOUNTER — Other Ambulatory Visit: Payer: Self-pay

## 2023-06-26 DIAGNOSIS — C50811 Malignant neoplasm of overlapping sites of right female breast: Secondary | ICD-10-CM | POA: Diagnosis not present

## 2023-06-26 DIAGNOSIS — Z170 Estrogen receptor positive status [ER+]: Secondary | ICD-10-CM | POA: Diagnosis not present

## 2023-06-26 DIAGNOSIS — Z510 Encounter for antineoplastic radiation therapy: Secondary | ICD-10-CM | POA: Diagnosis not present

## 2023-06-26 LAB — RAD ONC ARIA SESSION SUMMARY
Course Elapsed Days: 7
Plan Fractions Treated to Date: 6
Plan Prescribed Dose Per Fraction: 2.66 Gy
Plan Total Fractions Prescribed: 16
Plan Total Prescribed Dose: 42.56 Gy
Reference Point Dosage Given to Date: 15.96 Gy
Reference Point Session Dosage Given: 2.66 Gy
Session Number: 6

## 2023-06-27 ENCOUNTER — Ambulatory Visit
Admission: RE | Admit: 2023-06-27 | Discharge: 2023-06-27 | Disposition: A | Discharge status: Home / Self Care | Payer: Medicare Other | Source: Ambulatory Visit | Attending: Radiation Oncology | Admitting: Radiation Oncology

## 2023-06-27 ENCOUNTER — Other Ambulatory Visit: Payer: Self-pay

## 2023-06-27 DIAGNOSIS — C50811 Malignant neoplasm of overlapping sites of right female breast: Secondary | ICD-10-CM | POA: Diagnosis not present

## 2023-06-27 DIAGNOSIS — Z170 Estrogen receptor positive status [ER+]: Secondary | ICD-10-CM | POA: Diagnosis not present

## 2023-06-27 DIAGNOSIS — N186 End stage renal disease: Secondary | ICD-10-CM | POA: Diagnosis not present

## 2023-06-27 DIAGNOSIS — D631 Anemia in chronic kidney disease: Secondary | ICD-10-CM | POA: Diagnosis not present

## 2023-06-27 DIAGNOSIS — Z510 Encounter for antineoplastic radiation therapy: Secondary | ICD-10-CM | POA: Diagnosis not present

## 2023-06-27 DIAGNOSIS — Z992 Dependence on renal dialysis: Secondary | ICD-10-CM | POA: Diagnosis not present

## 2023-06-27 DIAGNOSIS — N2581 Secondary hyperparathyroidism of renal origin: Secondary | ICD-10-CM | POA: Diagnosis not present

## 2023-06-27 LAB — RAD ONC ARIA SESSION SUMMARY
Course Elapsed Days: 8
Plan Fractions Treated to Date: 7
Plan Prescribed Dose Per Fraction: 2.66 Gy
Plan Total Fractions Prescribed: 16
Plan Total Prescribed Dose: 42.56 Gy
Reference Point Dosage Given to Date: 18.62 Gy
Reference Point Session Dosage Given: 2.66 Gy
Session Number: 7

## 2023-06-29 DIAGNOSIS — E119 Type 2 diabetes mellitus without complications: Secondary | ICD-10-CM | POA: Diagnosis not present

## 2023-06-30 ENCOUNTER — Other Ambulatory Visit: Payer: Self-pay

## 2023-06-30 ENCOUNTER — Ambulatory Visit
Admission: RE | Admit: 2023-06-30 | Discharge: 2023-06-30 | Disposition: A | Discharge status: Home / Self Care | Payer: Medicare Other | Source: Ambulatory Visit | Attending: Radiation Oncology | Admitting: Radiation Oncology

## 2023-06-30 DIAGNOSIS — Z510 Encounter for antineoplastic radiation therapy: Secondary | ICD-10-CM | POA: Diagnosis not present

## 2023-06-30 DIAGNOSIS — C50811 Malignant neoplasm of overlapping sites of right female breast: Secondary | ICD-10-CM | POA: Diagnosis not present

## 2023-06-30 DIAGNOSIS — Z992 Dependence on renal dialysis: Secondary | ICD-10-CM | POA: Diagnosis not present

## 2023-06-30 DIAGNOSIS — N186 End stage renal disease: Secondary | ICD-10-CM | POA: Diagnosis not present

## 2023-06-30 DIAGNOSIS — N2581 Secondary hyperparathyroidism of renal origin: Secondary | ICD-10-CM | POA: Diagnosis not present

## 2023-06-30 DIAGNOSIS — D631 Anemia in chronic kidney disease: Secondary | ICD-10-CM | POA: Diagnosis not present

## 2023-06-30 DIAGNOSIS — Z170 Estrogen receptor positive status [ER+]: Secondary | ICD-10-CM | POA: Diagnosis not present

## 2023-06-30 LAB — RAD ONC ARIA SESSION SUMMARY
Course Elapsed Days: 11
Plan Fractions Treated to Date: 8
Plan Prescribed Dose Per Fraction: 2.66 Gy
Plan Total Fractions Prescribed: 16
Plan Total Prescribed Dose: 42.56 Gy
Reference Point Dosage Given to Date: 21.28 Gy
Reference Point Session Dosage Given: 2.66 Gy
Session Number: 8

## 2023-07-01 ENCOUNTER — Other Ambulatory Visit: Payer: Self-pay

## 2023-07-01 ENCOUNTER — Ambulatory Visit: Payer: Medicare Other | Admitting: Podiatry

## 2023-07-01 ENCOUNTER — Encounter: Payer: Self-pay | Admitting: Podiatry

## 2023-07-01 ENCOUNTER — Ambulatory Visit
Admission: RE | Admit: 2023-07-01 | Discharge: 2023-07-01 | Disposition: A | Discharge status: Home / Self Care | Payer: Medicare Other | Source: Ambulatory Visit | Attending: Radiation Oncology | Admitting: Radiation Oncology

## 2023-07-01 ENCOUNTER — Ambulatory Visit (INDEPENDENT_AMBULATORY_CARE_PROVIDER_SITE_OTHER): Payer: Medicare Other | Admitting: Podiatry

## 2023-07-01 DIAGNOSIS — M79674 Pain in right toe(s): Secondary | ICD-10-CM | POA: Diagnosis not present

## 2023-07-01 DIAGNOSIS — E0822 Diabetes mellitus due to underlying condition with diabetic chronic kidney disease: Secondary | ICD-10-CM

## 2023-07-01 DIAGNOSIS — M79675 Pain in left toe(s): Secondary | ICD-10-CM

## 2023-07-01 DIAGNOSIS — Z992 Dependence on renal dialysis: Secondary | ICD-10-CM

## 2023-07-01 DIAGNOSIS — B351 Tinea unguium: Secondary | ICD-10-CM | POA: Diagnosis not present

## 2023-07-01 DIAGNOSIS — Z510 Encounter for antineoplastic radiation therapy: Secondary | ICD-10-CM | POA: Diagnosis not present

## 2023-07-01 DIAGNOSIS — N186 End stage renal disease: Secondary | ICD-10-CM | POA: Diagnosis not present

## 2023-07-01 DIAGNOSIS — L84 Corns and callosities: Secondary | ICD-10-CM

## 2023-07-01 DIAGNOSIS — C50811 Malignant neoplasm of overlapping sites of right female breast: Secondary | ICD-10-CM | POA: Diagnosis not present

## 2023-07-01 DIAGNOSIS — Z170 Estrogen receptor positive status [ER+]: Secondary | ICD-10-CM | POA: Diagnosis not present

## 2023-07-01 LAB — RAD ONC ARIA SESSION SUMMARY
Course Elapsed Days: 12
Plan Fractions Treated to Date: 9
Plan Prescribed Dose Per Fraction: 2.66 Gy
Plan Total Fractions Prescribed: 16
Plan Total Prescribed Dose: 42.56 Gy
Reference Point Dosage Given to Date: 23.94 Gy
Reference Point Session Dosage Given: 2.66 Gy
Session Number: 9

## 2023-07-01 NOTE — Progress Notes (Signed)
This patient returns to my office for at risk foot care.  This patient requires this care by a professional since this patient will be at risk due to having diabetes.  This patient is unable to cut nails herself since the patient cannot reach her nails.These nails are painful walking and wearing shoes.  This patient presents for at risk foot care today.  General Appearance  Alert, conversant and in no acute stress.  Vascular  Dorsalis pedis and posterior tibial  pulses are palpable  bilaterally.  Capillary return is within normal limits  bilaterally. Temperature is within normal limits  bilaterally.  Neurologic  Senn-Weinstein monofilament wire test within normal limits  bilaterally. Muscle power within normal limits bilaterally.  Nails Thick disfigured discolored nails with subungual debris  from hallux to fifth toes bilaterally. No evidence of bacterial infection or drainage bilaterally.  Orthopedic  No limitations of motion  feet .  No crepitus or effusions noted.  No bony pathology or digital deformities noted.  Skin  normotropic skin with no porokeratosis noted bilaterally.  No signs of infections or ulcers noted.   Porokeratosis sub 5th met left foot.  Onychomycosis  Pain in right toes  Pain in left toes  Consent was obtained for treatment procedures.   Mechanical debridement of nails 1-5  bilaterally performed with a nail nipper.  Filed with dremel without incident.    Return office visit      prn               Told patient to return for periodic foot care and evaluation due to potential at risk complications.   Helane Gunther DPM

## 2023-07-02 ENCOUNTER — Other Ambulatory Visit: Payer: Self-pay

## 2023-07-02 ENCOUNTER — Ambulatory Visit
Admission: RE | Admit: 2023-07-02 | Discharge: 2023-07-02 | Disposition: A | Discharge status: Home / Self Care | Payer: Medicare Other | Source: Ambulatory Visit | Attending: Radiation Oncology | Admitting: Radiation Oncology

## 2023-07-02 DIAGNOSIS — Z992 Dependence on renal dialysis: Secondary | ICD-10-CM | POA: Diagnosis not present

## 2023-07-02 DIAGNOSIS — Z170 Estrogen receptor positive status [ER+]: Secondary | ICD-10-CM | POA: Diagnosis not present

## 2023-07-02 DIAGNOSIS — Z510 Encounter for antineoplastic radiation therapy: Secondary | ICD-10-CM | POA: Diagnosis not present

## 2023-07-02 DIAGNOSIS — D631 Anemia in chronic kidney disease: Secondary | ICD-10-CM | POA: Diagnosis not present

## 2023-07-02 DIAGNOSIS — N2581 Secondary hyperparathyroidism of renal origin: Secondary | ICD-10-CM | POA: Diagnosis not present

## 2023-07-02 DIAGNOSIS — C50811 Malignant neoplasm of overlapping sites of right female breast: Secondary | ICD-10-CM | POA: Diagnosis not present

## 2023-07-02 DIAGNOSIS — N186 End stage renal disease: Secondary | ICD-10-CM | POA: Diagnosis not present

## 2023-07-02 LAB — RAD ONC ARIA SESSION SUMMARY
Course Elapsed Days: 13
Plan Fractions Treated to Date: 10
Plan Prescribed Dose Per Fraction: 2.66 Gy
Plan Total Fractions Prescribed: 16
Plan Total Prescribed Dose: 42.56 Gy
Reference Point Dosage Given to Date: 26.6 Gy
Reference Point Session Dosage Given: 2.66 Gy
Session Number: 10

## 2023-07-03 ENCOUNTER — Ambulatory Visit
Admission: RE | Admit: 2023-07-03 | Discharge: 2023-07-03 | Disposition: A | Discharge status: Home / Self Care | Payer: Medicare Other | Source: Ambulatory Visit | Attending: Radiation Oncology | Admitting: Radiation Oncology

## 2023-07-03 ENCOUNTER — Other Ambulatory Visit: Payer: Self-pay

## 2023-07-03 DIAGNOSIS — C50811 Malignant neoplasm of overlapping sites of right female breast: Secondary | ICD-10-CM | POA: Diagnosis not present

## 2023-07-03 DIAGNOSIS — Z170 Estrogen receptor positive status [ER+]: Secondary | ICD-10-CM | POA: Insufficient documentation

## 2023-07-03 DIAGNOSIS — E1122 Type 2 diabetes mellitus with diabetic chronic kidney disease: Secondary | ICD-10-CM | POA: Diagnosis not present

## 2023-07-03 DIAGNOSIS — Z510 Encounter for antineoplastic radiation therapy: Secondary | ICD-10-CM | POA: Insufficient documentation

## 2023-07-03 DIAGNOSIS — Z992 Dependence on renal dialysis: Secondary | ICD-10-CM | POA: Diagnosis not present

## 2023-07-03 DIAGNOSIS — N186 End stage renal disease: Secondary | ICD-10-CM | POA: Diagnosis not present

## 2023-07-03 LAB — RAD ONC ARIA SESSION SUMMARY
Course Elapsed Days: 14
Plan Fractions Treated to Date: 11
Plan Prescribed Dose Per Fraction: 2.66 Gy
Plan Total Fractions Prescribed: 16
Plan Total Prescribed Dose: 42.56 Gy
Reference Point Dosage Given to Date: 29.26 Gy
Reference Point Session Dosage Given: 2.66 Gy
Session Number: 11

## 2023-07-04 ENCOUNTER — Ambulatory Visit
Admission: RE | Admit: 2023-07-04 | Discharge: 2023-07-04 | Disposition: A | Discharge status: Home / Self Care | Payer: Medicare Other | Source: Ambulatory Visit | Attending: Radiation Oncology | Admitting: Radiation Oncology

## 2023-07-04 ENCOUNTER — Ambulatory Visit: Payer: Medicare Other | Admitting: Radiation Oncology

## 2023-07-04 ENCOUNTER — Other Ambulatory Visit: Payer: Self-pay

## 2023-07-04 DIAGNOSIS — Z510 Encounter for antineoplastic radiation therapy: Secondary | ICD-10-CM | POA: Diagnosis not present

## 2023-07-04 DIAGNOSIS — N186 End stage renal disease: Secondary | ICD-10-CM | POA: Diagnosis not present

## 2023-07-04 DIAGNOSIS — Z992 Dependence on renal dialysis: Secondary | ICD-10-CM | POA: Diagnosis not present

## 2023-07-04 DIAGNOSIS — C50811 Malignant neoplasm of overlapping sites of right female breast: Secondary | ICD-10-CM | POA: Diagnosis not present

## 2023-07-04 DIAGNOSIS — Z170 Estrogen receptor positive status [ER+]: Secondary | ICD-10-CM | POA: Diagnosis not present

## 2023-07-04 DIAGNOSIS — D631 Anemia in chronic kidney disease: Secondary | ICD-10-CM | POA: Diagnosis not present

## 2023-07-04 DIAGNOSIS — R002 Palpitations: Secondary | ICD-10-CM | POA: Diagnosis not present

## 2023-07-04 DIAGNOSIS — I4510 Unspecified right bundle-branch block: Secondary | ICD-10-CM | POA: Diagnosis not present

## 2023-07-04 DIAGNOSIS — N2581 Secondary hyperparathyroidism of renal origin: Secondary | ICD-10-CM | POA: Diagnosis not present

## 2023-07-04 LAB — RAD ONC ARIA SESSION SUMMARY
Course Elapsed Days: 15
Plan Fractions Treated to Date: 12
Plan Prescribed Dose Per Fraction: 2.66 Gy
Plan Total Fractions Prescribed: 16
Plan Total Prescribed Dose: 42.56 Gy
Reference Point Dosage Given to Date: 31.92 Gy
Reference Point Session Dosage Given: 2.66 Gy
Session Number: 12

## 2023-07-07 ENCOUNTER — Ambulatory Visit
Admission: RE | Admit: 2023-07-07 | Discharge: 2023-07-07 | Disposition: A | Discharge status: Home / Self Care | Payer: Medicare Other | Source: Ambulatory Visit | Attending: Radiation Oncology | Admitting: Radiation Oncology

## 2023-07-07 ENCOUNTER — Other Ambulatory Visit: Payer: Self-pay

## 2023-07-07 DIAGNOSIS — Z992 Dependence on renal dialysis: Secondary | ICD-10-CM | POA: Diagnosis not present

## 2023-07-07 DIAGNOSIS — Z170 Estrogen receptor positive status [ER+]: Secondary | ICD-10-CM | POA: Diagnosis not present

## 2023-07-07 DIAGNOSIS — C50811 Malignant neoplasm of overlapping sites of right female breast: Secondary | ICD-10-CM | POA: Diagnosis not present

## 2023-07-07 DIAGNOSIS — N2581 Secondary hyperparathyroidism of renal origin: Secondary | ICD-10-CM | POA: Diagnosis not present

## 2023-07-07 DIAGNOSIS — Z510 Encounter for antineoplastic radiation therapy: Secondary | ICD-10-CM | POA: Diagnosis not present

## 2023-07-07 DIAGNOSIS — N186 End stage renal disease: Secondary | ICD-10-CM | POA: Diagnosis not present

## 2023-07-07 DIAGNOSIS — D631 Anemia in chronic kidney disease: Secondary | ICD-10-CM | POA: Diagnosis not present

## 2023-07-07 LAB — RAD ONC ARIA SESSION SUMMARY
Course Elapsed Days: 18
Plan Fractions Treated to Date: 13
Plan Prescribed Dose Per Fraction: 2.66 Gy
Plan Total Fractions Prescribed: 16
Plan Total Prescribed Dose: 42.56 Gy
Reference Point Dosage Given to Date: 34.58 Gy
Reference Point Session Dosage Given: 2.66 Gy
Session Number: 13

## 2023-07-08 ENCOUNTER — Other Ambulatory Visit: Payer: Self-pay

## 2023-07-08 ENCOUNTER — Ambulatory Visit
Admission: RE | Admit: 2023-07-08 | Discharge: 2023-07-08 | Disposition: A | Discharge status: Home / Self Care | Payer: Medicare Other | Source: Ambulatory Visit | Attending: Radiation Oncology | Admitting: Radiation Oncology

## 2023-07-08 DIAGNOSIS — Z510 Encounter for antineoplastic radiation therapy: Secondary | ICD-10-CM | POA: Diagnosis not present

## 2023-07-08 DIAGNOSIS — Z170 Estrogen receptor positive status [ER+]: Secondary | ICD-10-CM | POA: Diagnosis not present

## 2023-07-08 DIAGNOSIS — C50811 Malignant neoplasm of overlapping sites of right female breast: Secondary | ICD-10-CM | POA: Diagnosis not present

## 2023-07-08 LAB — RAD ONC ARIA SESSION SUMMARY
Course Elapsed Days: 19
Plan Fractions Treated to Date: 14
Plan Prescribed Dose Per Fraction: 2.66 Gy
Plan Total Fractions Prescribed: 16
Plan Total Prescribed Dose: 42.56 Gy
Reference Point Dosage Given to Date: 37.24 Gy
Reference Point Session Dosage Given: 2.66 Gy
Session Number: 14

## 2023-07-09 ENCOUNTER — Ambulatory Visit
Admission: RE | Admit: 2023-07-09 | Discharge: 2023-07-09 | Disposition: A | Discharge status: Home / Self Care | Payer: Medicare Other | Source: Ambulatory Visit | Attending: Radiation Oncology | Admitting: Radiation Oncology

## 2023-07-09 ENCOUNTER — Other Ambulatory Visit: Payer: Self-pay

## 2023-07-09 DIAGNOSIS — N186 End stage renal disease: Secondary | ICD-10-CM | POA: Diagnosis not present

## 2023-07-09 DIAGNOSIS — N2581 Secondary hyperparathyroidism of renal origin: Secondary | ICD-10-CM | POA: Diagnosis not present

## 2023-07-09 DIAGNOSIS — C50811 Malignant neoplasm of overlapping sites of right female breast: Secondary | ICD-10-CM | POA: Diagnosis not present

## 2023-07-09 DIAGNOSIS — Z170 Estrogen receptor positive status [ER+]: Secondary | ICD-10-CM | POA: Diagnosis not present

## 2023-07-09 DIAGNOSIS — D631 Anemia in chronic kidney disease: Secondary | ICD-10-CM | POA: Diagnosis not present

## 2023-07-09 DIAGNOSIS — Z510 Encounter for antineoplastic radiation therapy: Secondary | ICD-10-CM | POA: Diagnosis not present

## 2023-07-09 DIAGNOSIS — Z992 Dependence on renal dialysis: Secondary | ICD-10-CM | POA: Diagnosis not present

## 2023-07-09 LAB — RAD ONC ARIA SESSION SUMMARY
Course Elapsed Days: 20
Plan Fractions Treated to Date: 15
Plan Prescribed Dose Per Fraction: 2.66 Gy
Plan Total Fractions Prescribed: 16
Plan Total Prescribed Dose: 42.56 Gy
Reference Point Dosage Given to Date: 39.9 Gy
Reference Point Session Dosage Given: 2.66 Gy
Session Number: 15

## 2023-07-10 ENCOUNTER — Ambulatory Visit: Payer: Medicare Other

## 2023-07-11 ENCOUNTER — Ambulatory Visit
Admission: RE | Admit: 2023-07-11 | Discharge: 2023-07-11 | Disposition: A | Discharge status: Home / Self Care | Payer: Medicare Other | Source: Ambulatory Visit | Attending: Radiation Oncology | Admitting: Radiation Oncology

## 2023-07-11 ENCOUNTER — Ambulatory Visit: Payer: Medicare Other

## 2023-07-11 ENCOUNTER — Other Ambulatory Visit: Payer: Self-pay

## 2023-07-11 DIAGNOSIS — Z992 Dependence on renal dialysis: Secondary | ICD-10-CM | POA: Diagnosis not present

## 2023-07-11 DIAGNOSIS — N186 End stage renal disease: Secondary | ICD-10-CM | POA: Diagnosis not present

## 2023-07-11 DIAGNOSIS — D631 Anemia in chronic kidney disease: Secondary | ICD-10-CM | POA: Diagnosis not present

## 2023-07-11 DIAGNOSIS — Z170 Estrogen receptor positive status [ER+]: Secondary | ICD-10-CM | POA: Diagnosis not present

## 2023-07-11 DIAGNOSIS — C50811 Malignant neoplasm of overlapping sites of right female breast: Secondary | ICD-10-CM | POA: Diagnosis not present

## 2023-07-11 DIAGNOSIS — N2581 Secondary hyperparathyroidism of renal origin: Secondary | ICD-10-CM | POA: Diagnosis not present

## 2023-07-11 DIAGNOSIS — Z510 Encounter for antineoplastic radiation therapy: Secondary | ICD-10-CM | POA: Diagnosis not present

## 2023-07-11 LAB — RAD ONC ARIA SESSION SUMMARY
Course Elapsed Days: 22
Plan Fractions Treated to Date: 16
Plan Prescribed Dose Per Fraction: 2.66 Gy
Plan Total Fractions Prescribed: 16
Plan Total Prescribed Dose: 42.56 Gy
Reference Point Dosage Given to Date: 42.56 Gy
Reference Point Session Dosage Given: 2.66 Gy
Session Number: 16

## 2023-07-14 ENCOUNTER — Other Ambulatory Visit: Payer: Self-pay

## 2023-07-14 ENCOUNTER — Ambulatory Visit: Payer: Medicare Other

## 2023-07-14 ENCOUNTER — Ambulatory Visit
Admission: RE | Admit: 2023-07-14 | Discharge: 2023-07-14 | Disposition: A | Discharge status: Home / Self Care | Payer: Medicare Other | Source: Ambulatory Visit | Attending: Radiation Oncology | Admitting: Radiation Oncology

## 2023-07-14 DIAGNOSIS — N186 End stage renal disease: Secondary | ICD-10-CM | POA: Diagnosis not present

## 2023-07-14 DIAGNOSIS — N2581 Secondary hyperparathyroidism of renal origin: Secondary | ICD-10-CM | POA: Diagnosis not present

## 2023-07-14 DIAGNOSIS — D631 Anemia in chronic kidney disease: Secondary | ICD-10-CM | POA: Diagnosis not present

## 2023-07-14 DIAGNOSIS — Z992 Dependence on renal dialysis: Secondary | ICD-10-CM | POA: Diagnosis not present

## 2023-07-14 DIAGNOSIS — C50811 Malignant neoplasm of overlapping sites of right female breast: Secondary | ICD-10-CM | POA: Diagnosis not present

## 2023-07-14 DIAGNOSIS — Z170 Estrogen receptor positive status [ER+]: Secondary | ICD-10-CM | POA: Diagnosis not present

## 2023-07-14 DIAGNOSIS — Z510 Encounter for antineoplastic radiation therapy: Secondary | ICD-10-CM | POA: Diagnosis not present

## 2023-07-14 LAB — RAD ONC ARIA SESSION SUMMARY
Course Elapsed Days: 25
Plan Fractions Treated to Date: 1
Plan Prescribed Dose Per Fraction: 2 Gy
Plan Total Fractions Prescribed: 4
Plan Total Prescribed Dose: 8 Gy
Reference Point Dosage Given to Date: 2 Gy
Reference Point Session Dosage Given: 2 Gy
Session Number: 17

## 2023-07-15 ENCOUNTER — Ambulatory Visit
Admission: RE | Admit: 2023-07-15 | Discharge: 2023-07-15 | Disposition: A | Discharge status: Home / Self Care | Payer: Medicare Other | Source: Ambulatory Visit | Attending: Radiation Oncology | Admitting: Radiation Oncology

## 2023-07-15 ENCOUNTER — Other Ambulatory Visit: Payer: Self-pay

## 2023-07-15 DIAGNOSIS — Z510 Encounter for antineoplastic radiation therapy: Secondary | ICD-10-CM | POA: Diagnosis not present

## 2023-07-15 DIAGNOSIS — Z170 Estrogen receptor positive status [ER+]: Secondary | ICD-10-CM | POA: Diagnosis not present

## 2023-07-15 DIAGNOSIS — C50811 Malignant neoplasm of overlapping sites of right female breast: Secondary | ICD-10-CM | POA: Diagnosis not present

## 2023-07-15 LAB — RAD ONC ARIA SESSION SUMMARY
Course Elapsed Days: 26
Plan Fractions Treated to Date: 2
Plan Prescribed Dose Per Fraction: 2 Gy
Plan Total Fractions Prescribed: 4
Plan Total Prescribed Dose: 8 Gy
Reference Point Dosage Given to Date: 4 Gy
Reference Point Session Dosage Given: 2 Gy
Session Number: 18

## 2023-07-16 ENCOUNTER — Other Ambulatory Visit: Payer: Self-pay

## 2023-07-16 ENCOUNTER — Ambulatory Visit: Payer: Medicare Other

## 2023-07-16 ENCOUNTER — Ambulatory Visit
Admission: RE | Admit: 2023-07-16 | Discharge: 2023-07-16 | Disposition: A | Discharge status: Home / Self Care | Payer: Medicare Other | Source: Ambulatory Visit | Attending: Radiation Oncology | Admitting: Radiation Oncology

## 2023-07-16 DIAGNOSIS — N2581 Secondary hyperparathyroidism of renal origin: Secondary | ICD-10-CM | POA: Diagnosis not present

## 2023-07-16 DIAGNOSIS — N186 End stage renal disease: Secondary | ICD-10-CM | POA: Diagnosis not present

## 2023-07-16 DIAGNOSIS — C50811 Malignant neoplasm of overlapping sites of right female breast: Secondary | ICD-10-CM | POA: Diagnosis not present

## 2023-07-16 DIAGNOSIS — D631 Anemia in chronic kidney disease: Secondary | ICD-10-CM | POA: Diagnosis not present

## 2023-07-16 DIAGNOSIS — Z170 Estrogen receptor positive status [ER+]: Secondary | ICD-10-CM | POA: Diagnosis not present

## 2023-07-16 DIAGNOSIS — Z510 Encounter for antineoplastic radiation therapy: Secondary | ICD-10-CM | POA: Diagnosis not present

## 2023-07-16 DIAGNOSIS — Z992 Dependence on renal dialysis: Secondary | ICD-10-CM | POA: Diagnosis not present

## 2023-07-16 LAB — RAD ONC ARIA SESSION SUMMARY
Course Elapsed Days: 27
Plan Fractions Treated to Date: 3
Plan Prescribed Dose Per Fraction: 2 Gy
Plan Total Fractions Prescribed: 4
Plan Total Prescribed Dose: 8 Gy
Reference Point Dosage Given to Date: 6 Gy
Reference Point Session Dosage Given: 2 Gy
Session Number: 19

## 2023-07-17 ENCOUNTER — Other Ambulatory Visit: Payer: Self-pay

## 2023-07-17 ENCOUNTER — Ambulatory Visit
Admission: RE | Admit: 2023-07-17 | Discharge: 2023-07-17 | Disposition: A | Discharge status: Home / Self Care | Payer: Medicare Other | Source: Ambulatory Visit | Attending: Radiation Oncology | Admitting: Radiation Oncology

## 2023-07-17 DIAGNOSIS — Z170 Estrogen receptor positive status [ER+]: Secondary | ICD-10-CM | POA: Diagnosis not present

## 2023-07-17 DIAGNOSIS — C50811 Malignant neoplasm of overlapping sites of right female breast: Secondary | ICD-10-CM | POA: Diagnosis not present

## 2023-07-17 DIAGNOSIS — Z510 Encounter for antineoplastic radiation therapy: Secondary | ICD-10-CM | POA: Diagnosis not present

## 2023-07-17 LAB — RAD ONC ARIA SESSION SUMMARY
Course Elapsed Days: 28
Plan Fractions Treated to Date: 4
Plan Prescribed Dose Per Fraction: 2 Gy
Plan Total Fractions Prescribed: 4
Plan Total Prescribed Dose: 8 Gy
Reference Point Dosage Given to Date: 8 Gy
Reference Point Session Dosage Given: 2 Gy
Session Number: 20

## 2023-07-18 DIAGNOSIS — N186 End stage renal disease: Secondary | ICD-10-CM | POA: Diagnosis not present

## 2023-07-18 DIAGNOSIS — Z992 Dependence on renal dialysis: Secondary | ICD-10-CM | POA: Diagnosis not present

## 2023-07-18 DIAGNOSIS — D631 Anemia in chronic kidney disease: Secondary | ICD-10-CM | POA: Diagnosis not present

## 2023-07-18 DIAGNOSIS — N2581 Secondary hyperparathyroidism of renal origin: Secondary | ICD-10-CM | POA: Diagnosis not present

## 2023-07-18 NOTE — Radiation Completion Notes (Addendum)
  Radiation Oncology         [phone] ________________________________  Name: Brenda Arnold MRN: [id number]  Date of Service: 07/17/2023  DOB: 27-Jan-1956  End of Treatment Note  Diagnosis: Stage IA, pT1c, cN0M0, grade 1, ER positive invasive ductal carcinoma of the right breast   Intent: Curative     ==========DELIVERED PLANS==========  First Treatment Date: 2023-06-19 - Last Treatment Date: 2023-07-17   Plan Name: Breast_R Site: Breast, Right Technique: 3D Mode: Photon Dose Per Fraction: 2.66 Gy Prescribed Dose (Delivered / Prescribed): 42.56 Gy / 42.56 Gy Prescribed Fxs (Delivered / Prescribed): 16 / 16   Plan Name: Breast_R_Bst Site: Breast, Right Technique: 3D Mode: Photon Dose Per Fraction: 2 Gy Prescribed Dose (Delivered / Prescribed): 8 Gy / 8 Gy Prescribed Fxs (Delivered / Prescribed): 4 / 4     ==========ON TREATMENT VISIT DATES========== 2023-06-20, 2023-06-27, 2023-07-04, 2023-07-11     See weekly On Treatment Notes in Epic for details. The patient tolerated radiation without complaints of fatigue or skin changes at the conclusion of radiation.  The patient will receive a call in about one month from the radiation oncology department. She will continue follow up with Dr. Arno Bibles as well.      Shelvia Dick, PAC

## 2023-07-21 DIAGNOSIS — N2581 Secondary hyperparathyroidism of renal origin: Secondary | ICD-10-CM | POA: Diagnosis not present

## 2023-07-21 DIAGNOSIS — D631 Anemia in chronic kidney disease: Secondary | ICD-10-CM | POA: Diagnosis not present

## 2023-07-21 DIAGNOSIS — N186 End stage renal disease: Secondary | ICD-10-CM | POA: Diagnosis not present

## 2023-07-21 DIAGNOSIS — Z992 Dependence on renal dialysis: Secondary | ICD-10-CM | POA: Diagnosis not present

## 2023-07-23 ENCOUNTER — Telehealth: Payer: Self-pay | Admitting: Adult Health

## 2023-07-23 DIAGNOSIS — D631 Anemia in chronic kidney disease: Secondary | ICD-10-CM | POA: Diagnosis not present

## 2023-07-23 DIAGNOSIS — N186 End stage renal disease: Secondary | ICD-10-CM | POA: Diagnosis not present

## 2023-07-23 DIAGNOSIS — Z992 Dependence on renal dialysis: Secondary | ICD-10-CM | POA: Diagnosis not present

## 2023-07-23 DIAGNOSIS — N2581 Secondary hyperparathyroidism of renal origin: Secondary | ICD-10-CM | POA: Diagnosis not present

## 2023-07-23 NOTE — Telephone Encounter (Signed)
Patient is aware of scheduled appointment times/dates

## 2023-07-24 DIAGNOSIS — H60509 Unspecified acute noninfective otitis externa, unspecified ear: Secondary | ICD-10-CM | POA: Diagnosis not present

## 2023-07-24 DIAGNOSIS — H6121 Impacted cerumen, right ear: Secondary | ICD-10-CM | POA: Diagnosis not present

## 2023-07-25 DIAGNOSIS — N186 End stage renal disease: Secondary | ICD-10-CM | POA: Diagnosis not present

## 2023-07-25 DIAGNOSIS — N2581 Secondary hyperparathyroidism of renal origin: Secondary | ICD-10-CM | POA: Diagnosis not present

## 2023-07-25 DIAGNOSIS — D631 Anemia in chronic kidney disease: Secondary | ICD-10-CM | POA: Diagnosis not present

## 2023-07-25 DIAGNOSIS — Z992 Dependence on renal dialysis: Secondary | ICD-10-CM | POA: Diagnosis not present

## 2023-07-28 DIAGNOSIS — D631 Anemia in chronic kidney disease: Secondary | ICD-10-CM | POA: Diagnosis not present

## 2023-07-28 DIAGNOSIS — N186 End stage renal disease: Secondary | ICD-10-CM | POA: Diagnosis not present

## 2023-07-28 DIAGNOSIS — N2581 Secondary hyperparathyroidism of renal origin: Secondary | ICD-10-CM | POA: Diagnosis not present

## 2023-07-28 DIAGNOSIS — Z992 Dependence on renal dialysis: Secondary | ICD-10-CM | POA: Diagnosis not present

## 2023-07-30 DIAGNOSIS — N2581 Secondary hyperparathyroidism of renal origin: Secondary | ICD-10-CM | POA: Diagnosis not present

## 2023-07-30 DIAGNOSIS — Z992 Dependence on renal dialysis: Secondary | ICD-10-CM | POA: Diagnosis not present

## 2023-07-30 DIAGNOSIS — D631 Anemia in chronic kidney disease: Secondary | ICD-10-CM | POA: Diagnosis not present

## 2023-07-30 DIAGNOSIS — N186 End stage renal disease: Secondary | ICD-10-CM | POA: Diagnosis not present

## 2023-08-01 DIAGNOSIS — N186 End stage renal disease: Secondary | ICD-10-CM | POA: Diagnosis not present

## 2023-08-01 DIAGNOSIS — Z992 Dependence on renal dialysis: Secondary | ICD-10-CM | POA: Diagnosis not present

## 2023-08-01 DIAGNOSIS — N2581 Secondary hyperparathyroidism of renal origin: Secondary | ICD-10-CM | POA: Diagnosis not present

## 2023-08-01 DIAGNOSIS — D631 Anemia in chronic kidney disease: Secondary | ICD-10-CM | POA: Diagnosis not present

## 2023-08-03 DIAGNOSIS — Z992 Dependence on renal dialysis: Secondary | ICD-10-CM | POA: Diagnosis not present

## 2023-08-03 DIAGNOSIS — E1122 Type 2 diabetes mellitus with diabetic chronic kidney disease: Secondary | ICD-10-CM | POA: Diagnosis not present

## 2023-08-03 DIAGNOSIS — N186 End stage renal disease: Secondary | ICD-10-CM | POA: Diagnosis not present

## 2023-08-04 DIAGNOSIS — Z992 Dependence on renal dialysis: Secondary | ICD-10-CM | POA: Diagnosis not present

## 2023-08-04 DIAGNOSIS — N186 End stage renal disease: Secondary | ICD-10-CM | POA: Diagnosis not present

## 2023-08-04 DIAGNOSIS — N2581 Secondary hyperparathyroidism of renal origin: Secondary | ICD-10-CM | POA: Diagnosis not present

## 2023-08-04 DIAGNOSIS — D631 Anemia in chronic kidney disease: Secondary | ICD-10-CM | POA: Diagnosis not present

## 2023-08-05 ENCOUNTER — Encounter: Payer: Self-pay | Admitting: Vascular Surgery

## 2023-08-05 ENCOUNTER — Ambulatory Visit (INDEPENDENT_AMBULATORY_CARE_PROVIDER_SITE_OTHER): Payer: Medicare Other | Admitting: Vascular Surgery

## 2023-08-05 VITALS — BP 147/56 | HR 62 | Temp 97.6°F | Resp 18 | Ht 67.0 in | Wt 236.1 lb

## 2023-08-05 DIAGNOSIS — N186 End stage renal disease: Secondary | ICD-10-CM

## 2023-08-05 NOTE — Progress Notes (Signed)
Patient name: Brenda Arnold MRN: [id number] DOB: 1955-12-06 Sex: female  REASON FOR CONSULT: Ulceration/aneurysmal AVF  HPI: Brenda Arnold is a 67 y.o. female, with atrial fibrillation, DM, end-stage renal disease on hemodialysis Monday Wednesday Friday that presents for evaluation of an ulceration and aneurysm over her left radiocephalic AV fistula.  This was placed in 2013 by Dr. Darrick Penna.  It has undergone previous revision with aneurysm resection by myself in 2021.  States she has had several friends recently that have had major bleeding events and she is very concerned.  Fistula's has been working well.  Past Medical History:  Diagnosis Date   Agatston coronary artery calcium score greater than 400    Ca score 3824 on CT 08/2021   Anemia    Arthritis    Asthma    Breast cancer (HCC) 04/04/2023   Complication of anesthesia    difficulty with getting oxygen saturation up- "patient was not aware"  Bottom of feet burning in PACU   Constipation    because of Iron   Diabetes mellitus    not on meds   DVT (deep venous thrombosis) (HCC) 2019   post hip replacement - right   Dyslipidemia    Epistaxis 11/05/2012   ESRD (end stage renal disease) on dialysis [facility])    "MWF; Rudene Anda" (09/15/2018)- started 02/13/2017   History of blood transfusion    HTN (hypertension)    pt denies this. She states her "BP runs low now"   Hyperparathyroidism due to renal insufficiency (HCC)    Hypotension    Obesity    s/p panniculectomy   Paroxysmal A-fib (HCC)    a. chronic coumadin;  b. 12/2009 Echo: EF 60-65%, Gr 1 DD.   PCOS (polycystic ovarian syndrome)    Pericarditis 08/2019   pericarditis with pericardial effusion, s/p right VATS/pericardial window 08/31/19   Pneumonia    Seasonal allergies    Sleep apnea    a. not using CPAP, last study  >8 yrs   Vitamin D deficiency     Past Surgical History:  Procedure Laterality Date   ABDOMINAL HYSTERECTOMY     with panniculectomy    AV FISTULA PLACEMENT  09/02/2012   Procedure: ARTERIOVENOUS (AV) FISTULA CREATION;  Surgeon: Sherren Kerns, MD;  Location: [facility] OR;  Service: Vascular;  Laterality: Left;  Creation of Left Radial-Cephalic Fistula    BREAST BIOPSY Right 04/04/2023   [place] RT BREAST BX W LOC DEV 1ST LESION IMG BX SPEC US GUIDE 04/04/2023 [facility]AMMOGRAPHY   BREAST BIOPSY  05/06/2023   [place] RT RADIOACTIVE SEED LOC 05/06/2023 [facility] MAMMOGRAPHY   BREAST LUMPECTOMY WITH RADIOACTIVE SEED LOCALIZATION Right 05/08/2023   Procedure: RIGHT BREAST LUMPECTOMY WITH RADIOACTIVE SEED LOCALIZATION;  Surgeon: Griselda Miner, MD;  Location: [facility] OR;  Service: General;  Laterality: Right;   COLONOSCOPY     COLONOSCOPY W/ BIOPSIES AND POLYPECTOMY     DILATION AND CURETTAGE OF UTERUS     KNEE ARTHROSCOPY Right    PANNICULECTOMY     REVISON OF ARTERIOVENOUS FISTULA Left 04/27/2014   Procedure: REVISON OF LEFT RADIAL-CEPHALIC ARTERIOVENOUS FISTULA;  Surgeon: Pryor Ochoa, MD;  Location: [facility] OR;  Service: Vascular;  Laterality: Left;   REVISON OF ARTERIOVENOUS FISTULA Left 01/13/2020   Procedure: REVISON OF ARTERIOVENOUS FISTULA;  Surgeon: Cephus Shelling, MD;  Location: [facility] OR;  Service: Vascular;  Laterality: Left;   TOTAL HIP ARTHROPLASTY Right 09/15/2018   TOTAL HIP ARTHROPLASTY Right 09/15/2018  Procedure: RIGHT TOTAL HIP ARTHROPLASTY ANTERIOR APPROACH;  Surgeon: Tarry Kos, MD;  Location: [facility] OR;  Service: Orthopedics;  Laterality: Right;   TOTAL HIP ARTHROPLASTY Left 11/23/2020   Procedure: LEFT TOTAL HIP ARTHROPLASTY ANTERIOR APPROACH;  Surgeon: Tarry Kos, MD;  Location: [facility] OR;  Service: Orthopedics;  Laterality: Left;  NEEDS RNFA PLEASE   UVULOPLASTY     VIDEO ASSISTED THORACOSCOPY (VATS)/WEDGE RESECTION Right 08/31/2019   Procedure: VIDEO ASSISTED THORACOSCOPY/ DRAINAGE OF PERICARDIAL EFFUSION/ PERICARDIAL WINDOW/ ABORTED PERICARDIOCENTESIS ;  Surgeon: Corliss Skains, MD;  Location: [facility] OR;  Service: Thoracic;   Laterality: Right;    Family History  Problem Relation Age of Onset   Lung cancer Father        died @ 4   Hypertension Mother    Diabetes Brother    Kidney disease Brother     SOCIAL HISTORY: Social History   Socioeconomic History   Marital status: Single    Spouse name: Not on file   Number of children: 0   Years of education: Not on file   Highest education level: Not on file  Occupational History   Occupation: CORRESPONDENT ADMIN.    Employer: [employer]  Tobacco Use   Smoking status: Never   Smokeless tobacco: Never  Vaping Use   Vaping status: Never Used  Substance and Sexual Activity   Alcohol use: No    Alcohol/week: 0.0 standard drinks of alcohol   Drug use: No   Sexual activity: Not on file    Comment: Hysterectomy  Other Topics Concern   Not on file  Social History Narrative   Lives in [place] alone. 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   Social Determinants of Health   Financial Resource Strain: Not on file  Food Insecurity: No Food Insecurity (05/08/2023)   Hunger Vital Sign    Worried About Running Out of Food in the Last Year: Never true    Ran Out of Food in the Last Year: Never true  Transportation Needs: No Transportation Needs (05/08/2023)   PRAPARE - Administrator, Civil Service (Medical): No    Lack of Transportation (Non-Medical): No  Physical Activity: Not on file  Stress: Not on file  Social Connections: Not on file  Intimate Partner Violence: Not At Risk (05/08/2023)   Humiliation, Afraid, Rape, and Kick questionnaire    Fear of Current or Ex-Partner: No    Emotionally Abused: No    Physically Abused: No    Sexually Abused: No    Allergies  Allergen Reactions   Iodine Shortness Of Breath   Shellfish Allergy Shortness Of Breath    Other reaction(s): Breathing Problems   Shellfish-Derived Products Shortness Of Breath   Amlodipine Swelling and Other (See Comments)    Edema Other  reaction(s): edema   Felodipine Swelling    Headache   Lisinopril Cough   Chlorhexidine Itching    CHG wipes    Current Outpatient Medications  Medication Sig Dispense Refill   acetaminophen (TYLENOL) 500 MG tablet Take 2 tablets (1,000 mg total) by mouth every 8 (eight) hours as needed for mild pain.     amiodarone (PACERONE) 200 MG tablet Take 1 tablet (200 mg total) by mouth daily. 90 tablet 3   atorvastatin (LIPITOR) 40 MG tablet Take 1 tablet (40 mg total) by mouth daily. 90 tablet 3   B Complex-C-Folic Acid (DIALYVITE TABLET) TABS Take 1 tablet by mouth daily.     bisacodyl (  DULCOLAX) 5 MG EC tablet Take 1 tablet (5 mg total) by mouth daily as needed for moderate constipation.     colchicine 0.6 MG tablet TAKE 0.5 TABLETS (0.3 MG TOTAL) BY MOUTH EVERY MONDAY, WEDNESDAY, AND FRIDAY. 45 tablet 3   ELIQUIS 5 MG TABS tablet Take 1 tablet (5 mg total) by mouth 2 (two) times daily. 180 tablet 3   ethyl chloride spray Apply 1 application  topically every Monday, Wednesday, and Friday with hemodialysis.     fluticasone (FLONASE) 50 MCG/ACT nasal spray Place 2 sprays into both nostrils daily. (Patient taking differently: Place 2 sprays into both nostrils daily as needed for allergies.) 9.9 mL 0   gabapentin (NEURONTIN) 100 MG capsule Take 100 mg by mouth at bedtime.  3   loratadine (CLARITIN) 10 MG tablet Take 1 tablet (10 mg total) by mouth daily. (Patient taking differently: Take 10 mg by mouth daily as needed for allergies.) 30 tablet 0   methocarbamol (ROBAXIN) 500 MG tablet Take 1 tablet (500 mg total) by mouth every 8 (eight) hours as needed for muscle spasms. (Patient not taking: Reported on 05/01/2023) 30 tablet 0   midodrine (PROAMATINE) 10 MG tablet Take 1 tablet (10 mg total) by mouth 3 (three) times daily with meals.     oxyCODONE (ROXICODONE) 5 MG immediate release tablet Take 1 tablet (5 mg total) by mouth every 6 (six) hours as needed for severe pain. 10 tablet 0   sucroferric  oxyhydroxide (VELPHORO) 500 MG chewable tablet Chew 2 tablets (1,000 mg total) by mouth 3 (three) times daily with meals. 180 tablet 0   No current facility-administered medications for this visit.    REVIEW OF SYSTEMS:  [X]  denotes positive finding, [ ]  denotes negative finding Cardiac  Comments:  Chest pain or chest pressure:    Shortness of breath upon exertion:    Short of breath when lying flat:    Irregular heart rhythm:        Vascular    Pain in calf, thigh, or hip brought on by ambulation:    Pain in feet at night that wakes you up from your sleep:     Blood clot in your veins:    Leg swelling:         Pulmonary    Oxygen at home:    Productive cough:     Wheezing:         Neurologic    Sudden weakness in arms or legs:     Sudden numbness in arms or legs:     Sudden onset of difficulty speaking or slurred speech:    Temporary loss of vision in one eye:     Problems with dizziness:         Gastrointestinal    Blood in stool:     Vomited blood:         Genitourinary    Burning when urinating:     Blood in urine:        Psychiatric    Major depression:         Hematologic    Bleeding problems:    Problems with blood clotting too easily:        Skin    Rashes or ulcers:        Constitutional    Fever or chills:      PHYSICAL EXAM: There were no vitals filed for this visit.  GENERAL: The patient is a well-nourished female, in no acute distress. The  vital signs are documented above. CARDIAC: There is a regular rate and rhythm.  VASCULAR:  Left radiocephalic AV fistula with excellent thrill Ulcerated segment with aneurysm as pictured below PULMONARY: No respiratory distress. ABDOMEN: Soft and non-tender. MUSCULOSKELETAL: There are no major deformities or cyanosis. NEUROLOGIC: No focal weakness or paresthesias are detected. PSYCHIATRIC: The patient has a normal affect.    DATA:     Assessment/Plan:  67 y.o. female, with end-stage renal  disease on hemodialysis Monday Wednesday Friday that presents for evaluation of an ulceration and aneurysm over her left radiocephalic AV fistula.  This was placed in 2013 by Dr. Darrick Penna.  I have recommended left arm AV fistula revision at [facility] given risk of bleeding from the ulceration.  I offered to get this scheduled tomorrow but she is currently on Eliquis.  Will get scheduled for Monday.  I am hopeful we can avoid a catheter at her request and she should have enough room to stick away from the revised site.  This will need one months to heal as discussed with her.  Risk benefits discussed.   Cephus Shelling, MD [facility] Office: [phone]

## 2023-08-06 ENCOUNTER — Other Ambulatory Visit: Payer: Self-pay

## 2023-08-06 ENCOUNTER — Other Ambulatory Visit: Payer: Self-pay | Admitting: *Deleted

## 2023-08-06 ENCOUNTER — Encounter: Payer: Self-pay | Admitting: Hematology and Oncology

## 2023-08-06 ENCOUNTER — Inpatient Hospital Stay: Payer: Medicare Other | Attending: Hematology and Oncology | Admitting: Hematology and Oncology

## 2023-08-06 VITALS — BP 97/38 | HR 77 | Temp 98.2°F | Resp 18 | Ht 67.0 in | Wt 236.6 lb

## 2023-08-06 DIAGNOSIS — Z923 Personal history of irradiation: Secondary | ICD-10-CM | POA: Diagnosis not present

## 2023-08-06 DIAGNOSIS — C50811 Malignant neoplasm of overlapping sites of right female breast: Secondary | ICD-10-CM | POA: Diagnosis not present

## 2023-08-06 DIAGNOSIS — Z170 Estrogen receptor positive status [ER+]: Secondary | ICD-10-CM | POA: Diagnosis not present

## 2023-08-06 DIAGNOSIS — N186 End stage renal disease: Secondary | ICD-10-CM | POA: Diagnosis not present

## 2023-08-06 DIAGNOSIS — N2581 Secondary hyperparathyroidism of renal origin: Secondary | ICD-10-CM | POA: Diagnosis not present

## 2023-08-06 DIAGNOSIS — D631 Anemia in chronic kidney disease: Secondary | ICD-10-CM | POA: Diagnosis not present

## 2023-08-06 DIAGNOSIS — Z992 Dependence on renal dialysis: Secondary | ICD-10-CM | POA: Diagnosis not present

## 2023-08-06 MED ORDER — ANASTROZOLE 1 MG PO TABS: 1.0000 mg | ORAL_TABLET | Freq: Every day | ORAL | 3 refills | Status: DC

## 2023-08-06 NOTE — Progress Notes (Signed)
[facility] CONSULT NOTE  Patient Care Team: [name], MD as PCP - General (Internal Medicine) Quintella Reichert, MD as PCP - Cardiology (Cardiology) Camille Bal, MD (Nephrology) Pershing Proud, RN as Oncology Nurse Navigator Donnelly Angelica, RN as Oncology Nurse Navigator Rachel Moulds, MD as Consulting Physician (Hematology and Oncology) [facility]  CHIEF COMPLAINTS/PURPOSE OF CONSULTATION:  Newly diagnosed breast cancer  HISTORY OF PRESENTING ILLNESS:  Brenda Arnold 67 y.o. female is here because of recent diagnosis of right breast cancer  I reviewed her records extensively and collaborated the history with the patient.  SUMMARY OF ONCOLOGIC HISTORY: Oncology History  Malignant neoplasm of overlapping sites of right female breast (HCC)  03/21/2023 Mammogram   Diagnostic mammogram showed suspicious mass in the right breast at 6:00 measuring 1.3 cm, at the palpable site of concern in the left axilla there benign sebaceous or epidermal inclusion cyst.  No mammographic evidence of malignancy in the left breast.   04/04/2023 Pathology Results   Right breast needle core biopsy showed invasive ductal carcinoma overall grade 1, prognostic showed ER 100% positive strong staining PR negative HER2 0 and Ki-67 of 10%   04/24/2023 Initial Diagnosis   Malignant neoplasm of overlapping sites of right female breast (HCC)   04/24/2023 Cancer Staging   Staging form: Breast, AJCC 8th Edition - Clinical stage from 04/24/2023: Stage IA (cT1c, cN0, cM0, G1, ER+, PR-, HER2-) - Signed by Ronny Bacon, PA-C on 04/24/2023 Stage prefix: Initial diagnosis Method of lymph node assessment: Clinical Histologic grading system: 3 grade system    She is here for follow up. She denies any new complaints at all. She is now post radiation, completed about 2 weeks ago.   MEDICAL HISTORY:  Past Medical History:  Diagnosis Date   Agatston coronary artery  calcium score greater than 400    Ca score 3824 on CT 08/2021   Anemia    Arthritis    Asthma    Breast cancer (HCC) 04/04/2023   Complication of anesthesia    difficulty with getting oxygen saturation up- "patient was not aware"  Bottom of feet burning in PACU   Constipation    because of Iron   Diabetes mellitus    not on meds   DVT (deep venous thrombosis) (HCC) 2019   post hip replacement - right   Dyslipidemia    Epistaxis 11/05/2012   ESRD (end stage renal disease) on dialysis [facility])    "MWF; Rudene Anda" (09/15/2018)- started 02/13/2017   History of blood transfusion    HTN (hypertension)    pt denies this. She states her "BP runs low now"   Hyperparathyroidism due to renal insufficiency (HCC)    Hypotension    Obesity    s/p panniculectomy   Paroxysmal A-fib (HCC)    a. chronic coumadin;  b. 12/2009 Echo: EF 60-65%, Gr 1 DD.   PCOS (polycystic ovarian syndrome)    Pericarditis 08/2019   pericarditis with pericardial effusion, s/p right VATS/pericardial window 08/31/19   Pneumonia    Seasonal allergies    Sleep apnea    a. not using CPAP, last study  >8 yrs   Vitamin D deficiency     SURGICAL HISTORY: Past Surgical History:  Procedure Laterality Date   ABDOMINAL HYSTERECTOMY     with panniculectomy   AV FISTULA PLACEMENT  09/02/2012   Procedure: ARTERIOVENOUS (AV) FISTULA CREATION;  Surgeon: Sherren Kerns, MD;  Location: [facility] OR;  Service:  Vascular;  Laterality: Left;  Creation of Left Radial-Cephalic Fistula    BREAST BIOPSY Right 04/04/2023   [place] RT BREAST BX W LOC DEV 1ST LESION IMG BX SPEC US GUIDE 04/04/2023 [facility]AMMOGRAPHY   BREAST BIOPSY  05/06/2023   [place] RT RADIOACTIVE SEED LOC 05/06/2023 [facility]AMMOGRAPHY   BREAST LUMPECTOMY WITH RADIOACTIVE SEED LOCALIZATION Right 05/08/2023   Procedure: RIGHT BREAST LUMPECTOMY WITH RADIOACTIVE SEED LOCALIZATION;  Surgeon: Griselda Miner, MD;  Location: [facility] OR;  Service: General;  Laterality: Right;   COLONOSCOPY      COLONOSCOPY W/ BIOPSIES AND POLYPECTOMY     DILATION AND CURETTAGE OF UTERUS     KNEE ARTHROSCOPY Right    PANNICULECTOMY     REVISON OF ARTERIOVENOUS FISTULA Left 04/27/2014   Procedure: REVISON OF LEFT RADIAL-CEPHALIC ARTERIOVENOUS FISTULA;  Surgeon: Pryor Ochoa, MD;  Location: [facility] OR;  Service: Vascular;  Laterality: Left;   REVISON OF ARTERIOVENOUS FISTULA Left 01/13/2020   Procedure: REVISON OF ARTERIOVENOUS FISTULA;  Surgeon: Cephus Shelling, MD;  Location: [facility] OR;  Service: Vascular;  Laterality: Left;   TOTAL HIP ARTHROPLASTY Right 09/15/2018   TOTAL HIP ARTHROPLASTY Right 09/15/2018   Procedure: RIGHT TOTAL HIP ARTHROPLASTY ANTERIOR APPROACH;  Surgeon: Tarry Kos, MD;  Location: [facility] OR;  Service: Orthopedics;  Laterality: Right;   TOTAL HIP ARTHROPLASTY Left 11/23/2020   Procedure: LEFT TOTAL HIP ARTHROPLASTY ANTERIOR APPROACH;  Surgeon: Tarry Kos, MD;  Location: [facility] OR;  Service: Orthopedics;  Laterality: Left;  NEEDS RNFA PLEASE   UVULOPLASTY     VIDEO ASSISTED THORACOSCOPY (VATS)/WEDGE RESECTION Right 08/31/2019   Procedure: VIDEO ASSISTED THORACOSCOPY/ DRAINAGE OF PERICARDIAL EFFUSION/ PERICARDIAL WINDOW/ ABORTED PERICARDIOCENTESIS ;  Surgeon: Corliss Skains, MD;  Location: [facility] OR;  Service: Thoracic;  Laterality: Right;    SOCIAL HISTORY: Social History   Socioeconomic History   Marital status: Single    Spouse name: Not on file   Number of children: 0   Years of education: Not on file   Highest education level: Not on file  Occupational History   Occupation: CORRESPONDENT ADMIN.    Employer: [employer]  Tobacco Use   Smoking status: Never   Smokeless tobacco: Never  Vaping Use   Vaping status: Never Used  Substance and Sexual Activity   Alcohol use: No    Alcohol/week: 0.0 standard drinks of alcohol   Drug use: No   Sexual activity: Not on file    Comment: Hysterectomy  Other Topics Concern   Not on file  Social History Narrative    Lives in [place] alone. 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   Social Determinants of Health   Financial Resource Strain: Not on file  Food Insecurity: No Food Insecurity (05/08/2023)   Hunger Vital Sign    Worried About Running Out of Food in the Last Year: Never true    Ran Out of Food in the Last Year: Never true  Transportation Needs: No Transportation Needs (05/08/2023)   PRAPARE - Administrator, Civil Service (Medical): No    Lack of Transportation (Non-Medical): No  Physical Activity: Not on file  Stress: Not on file  Social Connections: Not on file  Intimate Partner Violence: Not At Risk (05/08/2023)   Humiliation, Afraid, Rape, and Kick questionnaire    Fear of Current or Ex-Partner: No    Emotionally Abused: No    Physically Abused: No    Sexually Abused: No    FAMILY  HISTORY: Family History  Problem Relation Age of Onset   Lung cancer Father        died @ 25   Hypertension Mother    Diabetes Brother    Kidney disease Brother     ALLERGIES:  is allergic to iodine, shellfish allergy, shellfish-derived products, amlodipine, felodipine, lisinopril, and chlorhexidine.  MEDICATIONS:  Current Outpatient Medications  Medication Sig Dispense Refill   acetaminophen (TYLENOL) 500 MG tablet Take 2 tablets (1,000 mg total) by mouth every 8 (eight) hours as needed for mild pain.     amiodarone (PACERONE) 200 MG tablet Take 1 tablet (200 mg total) by mouth daily. 90 tablet 3   atorvastatin (LIPITOR) 40 MG tablet Take 1 tablet (40 mg total) by mouth daily. 90 tablet 3   B Complex-C-Folic Acid (DIALYVITE TABLET) TABS Take 1 tablet by mouth daily.     bisacodyl (DULCOLAX) 5 MG EC tablet Take 1 tablet (5 mg total) by mouth daily as needed for moderate constipation.     colchicine 0.6 MG tablet TAKE 0.5 TABLETS (0.3 MG TOTAL) BY MOUTH EVERY MONDAY, WEDNESDAY, AND FRIDAY. 45 tablet 3   ELIQUIS 5 MG TABS tablet Take 1 tablet (5 mg total) by mouth 2  (two) times daily. 180 tablet 3   ethyl chloride spray Apply 1 application  topically every Monday, Wednesday, and Friday with hemodialysis.     fluticasone (FLONASE) 50 MCG/ACT nasal spray Place 2 sprays into both nostrils daily. (Patient taking differently: Place 2 sprays into both nostrils daily as needed for allergies.) 9.9 mL 0   gabapentin (NEURONTIN) 100 MG capsule Take 100 mg by mouth at bedtime.  3   loratadine (CLARITIN) 10 MG tablet Take 1 tablet (10 mg total) by mouth daily. (Patient taking differently: Take 10 mg by mouth daily as needed for allergies.) 30 tablet 0   methocarbamol (ROBAXIN) 500 MG tablet Take 1 tablet (500 mg total) by mouth every 8 (eight) hours as needed for muscle spasms. (Patient not taking: Reported on 05/01/2023) 30 tablet 0   midodrine (PROAMATINE) 10 MG tablet Take 1 tablet (10 mg total) by mouth 3 (three) times daily with meals.     oxyCODONE (ROXICODONE) 5 MG immediate release tablet Take 1 tablet (5 mg total) by mouth every 6 (six) hours as needed for severe pain. 10 tablet 0   sucroferric oxyhydroxide (VELPHORO) 500 MG chewable tablet Chew 2 tablets (1,000 mg total) by mouth 3 (three) times daily with meals. 180 tablet 0   No current facility-administered medications for this visit.    REVIEW OF SYSTEMS:   Constitutional: Denies fevers, chills or abnormal night sweats Eyes: Denies blurriness of vision, double vision or watery eyes Ears, nose, mouth, throat, and face: Denies mucositis or sore throat Respiratory: Denies cough, dyspnea or wheezes Cardiovascular: Denies palpitation, chest discomfort or lower extremity swelling Gastrointestinal:  Denies nausea, heartburn or change in bowel habits Skin: Denies abnormal skin rashes Lymphatics: Denies new lymphadenopathy or easy bruising Neurological:Denies numbness, tingling or new weaknesses Behavioral/Psych: Mood is stable, no new changes  Breast: Felt left axilla lumps not the right breast. All other  systems were reviewed with the patient and are negative.  PHYSICAL EXAMINATION: ECOG PERFORMANCE STATUS: 1 - Symptomatic but completely ambulatory  Vitals:   08/06/23 1542  BP: (!) 97/38  Pulse: 77  Resp: 18  Temp: 98.2 F (36.8 C)  SpO2: 100%   Filed Weights   08/06/23 1542  Weight: 236 lb 9.6 oz (107.3 kg)  GENERAL:alert, no distress and comfortable   LABORATORY DATA:  I have reviewed the data as listed Lab Results  Component Value Date   WBC 13.8 (H) 05/09/2023   HGB 10.1 (L) 05/09/2023   HCT 30.8 (L) 05/09/2023   MCV 98.1 05/09/2023   PLT 189 05/09/2023   Lab Results  Component Value Date   NA 138 05/09/2023   K 3.5 05/09/2023   CL 95 (L) 05/09/2023   CO2 27 05/09/2023    RADIOGRAPHIC STUDIES: I have personally reviewed the radiological reports and agreed with the findings in the report.  ASSESSMENT AND PLAN:  No problem-specific Assessment & Plan notes found for this encounter.    All questions were answered. The patient knows to call the clinic with any problems, questions or concerns.    Rachel Moulds, MD 08/06/23

## 2023-08-06 NOTE — Assessment & Plan Note (Addendum)
This is a very pleasant 67 year old postmenopausal female patient with past medical history significant for hypertension, end-stage renal disease, A-fib on anticoagulation, history of DVT, diabetes mellitus referred to medical oncology for new diagnosis of right breast invasive ductal carcinoma.  Given small size, node-negative and strong ER staining, she will proceed with upfront surgery followed by Oncotype Dx testing.  She has completed surgery, healing very well. Oncotype dx of 14, no role for adjuvant chemotherapy.  She is now here status post adjuvant radiation and is going to initiate antiestrogen therapy.  She is hoping to proceed with aromatase inhibitors.  I once again reviewed the mechanism of action, adverse effects including but not limited to fatigue, hot flashes, vaginal dryness, arthralgias and bone density loss.  She is willing to try it and will return to clinic in approximately 3 months for survivorship visit/toxicity check.  She had a bone density recently which showed normal bone density and encouraged some weightbearing exercises, vitamin D supplementation as recommended by nephrology team.

## 2023-08-06 NOTE — Progress Notes (Signed)
[facility] CONSULT NOTE  Patient Care Team: [name], MD as PCP - General (Internal Medicine) Quintella Reichert, MD as PCP - Cardiology (Cardiology) Camille Bal, MD (Nephrology) Pershing Proud, RN as Oncology Nurse Navigator Donnelly Angelica, RN as Oncology Nurse Navigator Rachel Moulds, MD as Consulting Physician (Hematology and Oncology) Center, [facility]  CHIEF COMPLAINTS/PURPOSE OF CONSULTATION:  Newly diagnosed breast cancer  HISTORY OF PRESENTING ILLNESS:  Brenda Arnold 67 y.o. female is here because of recent diagnosis of right breast cancer  I reviewed her records extensively and collaborated the history with the patient.  SUMMARY OF ONCOLOGIC HISTORY: Oncology History  Malignant neoplasm of overlapping sites of right female breast (HCC)  03/21/2023 Mammogram   Diagnostic mammogram showed suspicious mass in the right breast at 6:00 measuring 1.3 cm, at the palpable site of concern in the left axilla there benign sebaceous or epidermal inclusion cyst.  No mammographic evidence of malignancy in the left breast.   04/04/2023 Pathology Results   Right breast needle core biopsy showed invasive ductal carcinoma overall grade 1, prognostic showed ER 100% positive strong staining PR negative HER2 0 and Ki-67 of 10%   04/24/2023 Initial Diagnosis   Malignant neoplasm of overlapping sites of right female breast (HCC)   04/24/2023 Cancer Staging   Staging form: Breast, AJCC 8th Edition - Clinical stage from 04/24/2023: Stage IA (cT1c, cN0, cM0, G1, ER+, PR-, HER2-) - Signed by Ronny Bacon, PA-C on 04/24/2023 Stage prefix: Initial diagnosis Method of lymph node assessment: Clinical Histologic grading system: 3 grade system    She is here for follow up. She denies any new complaints at all. She did well with surgery and she is now here post radiation. She arrived today with her mom. Rest of the pertinent 10 point ROS reviewed and  neg.  MEDICAL HISTORY:  Past Medical History:  Diagnosis Date   Agatston coronary artery calcium score greater than 400    Ca score 3824 on CT 08/2021   Anemia    Arthritis    Asthma    Breast cancer (HCC) 04/04/2023   Complication of anesthesia    difficulty with getting oxygen saturation up- "patient was not aware"  Bottom of feet burning in PACU   Constipation    because of Iron   Diabetes mellitus    not on meds   DVT (deep venous thrombosis) (HCC) 2019   post hip replacement - right   Dyslipidemia    Epistaxis 11/05/2012   ESRD (end stage renal disease) on dialysis [facility])    "MWF; Rudene Anda" (09/15/2018)- started 02/13/2017   History of blood transfusion    HTN (hypertension)    pt denies this. She states her "BP runs low now"   Hyperparathyroidism due to renal insufficiency (HCC)    Hypotension    Obesity    s/p panniculectomy   Paroxysmal A-fib (HCC)    a. chronic coumadin;  b. 12/2009 Echo: EF 60-65%, Gr 1 DD.   PCOS (polycystic ovarian syndrome)    Pericarditis 08/2019   pericarditis with pericardial effusion, s/p right VATS/pericardial window 08/31/19   Pneumonia    Seasonal allergies    Sleep apnea    a. not using CPAP, last study  >8 yrs   Vitamin D deficiency     SURGICAL HISTORY: Past Surgical History:  Procedure Laterality Date   ABDOMINAL HYSTERECTOMY     with panniculectomy   AV FISTULA PLACEMENT  09/02/2012  Procedure: ARTERIOVENOUS (AV) FISTULA CREATION;  Surgeon: Sherren Kerns, MD;  Location: [facility] OR;  Service: Vascular;  Laterality: Left;  Creation of Left Radial-Cephalic Fistula    BREAST BIOPSY Right 04/04/2023   [place] RT BREAST BX W LOC DEV 1ST LESION IMG BX SPEC US GUIDE 04/04/2023 [facility] MAMMOGRAPHY   BREAST BIOPSY  05/06/2023   [place] RT RADIOACTIVE SEED LOC 05/06/2023 [facility] MAMMOGRAPHY   BREAST LUMPECTOMY WITH RADIOACTIVE SEED LOCALIZATION Right 05/08/2023   Procedure: RIGHT BREAST LUMPECTOMY WITH RADIOACTIVE SEED LOCALIZATION;  Surgeon: Griselda Miner, MD;  Location: [facility] OR;  Service: General;  Laterality: Right;   COLONOSCOPY     COLONOSCOPY W/ BIOPSIES AND POLYPECTOMY     DILATION AND CURETTAGE OF UTERUS     KNEE ARTHROSCOPY Right    PANNICULECTOMY     REVISON OF ARTERIOVENOUS FISTULA Left 04/27/2014   Procedure: REVISON OF LEFT RADIAL-CEPHALIC ARTERIOVENOUS FISTULA;  Surgeon: Pryor Ochoa, MD;  Location: [facility] OR;  Service: Vascular;  Laterality: Left;   REVISON OF ARTERIOVENOUS FISTULA Left 01/13/2020   Procedure: REVISON OF ARTERIOVENOUS FISTULA;  Surgeon: Cephus Shelling, MD;  Location: [facility] OR;  Service: Vascular;  Laterality: Left;   TOTAL HIP ARTHROPLASTY Right 09/15/2018   TOTAL HIP ARTHROPLASTY Right 09/15/2018   Procedure: RIGHT TOTAL HIP ARTHROPLASTY ANTERIOR APPROACH;  Surgeon: Tarry Kos, MD;  Location: [facility] OR;  Service: Orthopedics;  Laterality: Right;   TOTAL HIP ARTHROPLASTY Left 11/23/2020   Procedure: LEFT TOTAL HIP ARTHROPLASTY ANTERIOR APPROACH;  Surgeon: Tarry Kos, MD;  Location: [facility] OR;  Service: Orthopedics;  Laterality: Left;  NEEDS RNFA PLEASE   UVULOPLASTY     VIDEO ASSISTED THORACOSCOPY (VATS)/WEDGE RESECTION Right 08/31/2019   Procedure: VIDEO ASSISTED THORACOSCOPY/ DRAINAGE OF PERICARDIAL EFFUSION/ PERICARDIAL WINDOW/ ABORTED PERICARDIOCENTESIS ;  Surgeon: Corliss Skains, MD;  Location: [facility] OR;  Service: Thoracic;  Laterality: Right;    SOCIAL HISTORY: Social History   Socioeconomic History   Marital status: Single    Spouse name: Not on file   Number of children: 0   Years of education: Not on file   Highest education level: Not on file  Occupational History   Occupation: CORRESPONDENT ADMIN.    Employer: [employer]  Tobacco Use   Smoking status: Never   Smokeless tobacco: Never  Vaping Use   Vaping status: Never Used  Substance and Sexual Activity   Alcohol use: No    Alcohol/week: 0.0 standard drinks of alcohol   Drug use: No   Sexual activity: Not on file     Comment: Hysterectomy  Other Topics Concern   Not on file  Social History Narrative   Lives in [place] alone. 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   Social Determinants of Health   Financial Resource Strain: Not on file  Food Insecurity: No Food Insecurity (05/08/2023)   Hunger Vital Sign    Worried About Running Out of Food in the Last Year: Never true    Ran Out of Food in the Last Year: Never true  Transportation Needs: No Transportation Needs (05/08/2023)   PRAPARE - Administrator, Civil Service (Medical): No    Lack of Transportation (Non-Medical): No  Physical Activity: Not on file  Stress: Not on file  Social Connections: Not on file  Intimate Partner Violence: Not At Risk (05/08/2023)   Humiliation, Afraid, Rape, and Kick questionnaire    Fear of Current or Ex-Partner: No    Emotionally Abused:  No    Physically Abused: No    Sexually Abused: No    FAMILY HISTORY: Family History  Problem Relation Age of Onset   Lung cancer Father        died @ 18   Hypertension Mother    Diabetes Brother    Kidney disease Brother     ALLERGIES:  is allergic to iodine, shellfish allergy, shellfish-derived products, amlodipine, felodipine, lisinopril, and chlorhexidine.  MEDICATIONS:  Current Outpatient Medications  Medication Sig Dispense Refill   acetaminophen (TYLENOL) 500 MG tablet Take 2 tablets (1,000 mg total) by mouth every 8 (eight) hours as needed for mild pain.     amiodarone (PACERONE) 200 MG tablet Take 1 tablet (200 mg total) by mouth daily. 90 tablet 3   anastrozole (ARIMIDEX) 1 MG tablet Take 1 tablet (1 mg total) by mouth daily. 90 tablet 3   atorvastatin (LIPITOR) 40 MG tablet Take 1 tablet (40 mg total) by mouth daily. 90 tablet 3   B Complex-C-Folic Acid (DIALYVITE TABLET) TABS Take 1 tablet by mouth daily.     bisacodyl (DULCOLAX) 5 MG EC tablet Take 1 tablet (5 mg total) by mouth daily as needed for moderate constipation.      colchicine 0.6 MG tablet TAKE 0.5 TABLETS (0.3 MG TOTAL) BY MOUTH EVERY MONDAY, WEDNESDAY, AND FRIDAY. 45 tablet 3   ELIQUIS 5 MG TABS tablet Take 1 tablet (5 mg total) by mouth 2 (two) times daily. 180 tablet 3   ethyl chloride spray Apply 1 application  topically every Monday, Wednesday, and Friday with hemodialysis.     fluticasone (FLONASE) 50 MCG/ACT nasal spray Place 2 sprays into both nostrils daily. (Patient taking differently: Place 2 sprays into both nostrils daily as needed for allergies.) 9.9 mL 0   gabapentin (NEURONTIN) 100 MG capsule Take 100 mg by mouth at bedtime.  3   loratadine (CLARITIN) 10 MG tablet Take 1 tablet (10 mg total) by mouth daily. (Patient taking differently: Take 10 mg by mouth daily as needed for allergies.) 30 tablet 0   methocarbamol (ROBAXIN) 500 MG tablet Take 1 tablet (500 mg total) by mouth every 8 (eight) hours as needed for muscle spasms. (Patient not taking: Reported on 05/01/2023) 30 tablet 0   midodrine (PROAMATINE) 10 MG tablet Take 1 tablet (10 mg total) by mouth 3 (three) times daily with meals.     oxyCODONE (ROXICODONE) 5 MG immediate release tablet Take 1 tablet (5 mg total) by mouth every 6 (six) hours as needed for severe pain. 10 tablet 0   sucroferric oxyhydroxide (VELPHORO) 500 MG chewable tablet Chew 2 tablets (1,000 mg total) by mouth 3 (three) times daily with meals. 180 tablet 0   No current facility-administered medications for this visit.    REVIEW OF SYSTEMS:   Constitutional: Denies fevers, chills or abnormal night sweats Eyes: Denies blurriness of vision, double vision or watery eyes Ears, nose, mouth, throat, and face: Denies mucositis or sore throat Respiratory: Denies cough, dyspnea or wheezes Cardiovascular: Denies palpitation, chest discomfort or lower extremity swelling Gastrointestinal:  Denies nausea, heartburn or change in bowel habits Skin: Denies abnormal skin rashes Lymphatics: Denies new lymphadenopathy or easy  bruising Neurological:Denies numbness, tingling or new weaknesses Behavioral/Psych: Mood is stable, no new changes  Breast: Felt left axilla lumps not the right breast. All other systems were reviewed with the patient and are negative.  PHYSICAL EXAMINATION: ECOG PERFORMANCE STATUS: 1 - Symptomatic but completely ambulatory  Vitals:   08/06/23 1542  BP: (!) 97/38  Pulse: 77  Resp: 18  Temp: 98.2 F (36.8 C)  SpO2: 100%   Filed Weights   08/06/23 1542  Weight: 236 lb 9.6 oz (107.3 kg)    GENERAL:alert, no distress and comfortable  LABORATORY DATA:  I have reviewed the data as listed Lab Results  Component Value Date   WBC 13.8 (H) 05/09/2023   HGB 10.1 (L) 05/09/2023   HCT 30.8 (L) 05/09/2023   MCV 98.1 05/09/2023   PLT 189 05/09/2023   Lab Results  Component Value Date   NA 138 05/09/2023   K 3.5 05/09/2023   CL 95 (L) 05/09/2023   CO2 27 05/09/2023    RADIOGRAPHIC STUDIES: I have personally reviewed the radiological reports and agreed with the findings in the report.  ASSESSMENT AND PLAN:  Malignant neoplasm of overlapping sites of right female breast [facility]) This is a very pleasant 67 year old postmenopausal female patient with past medical history significant for hypertension, end-stage renal disease, A-fib on anticoagulation, history of DVT, diabetes mellitus referred to medical oncology for new diagnosis of right breast invasive ductal carcinoma.  Given small size, node-negative and strong ER staining, she will proceed with upfront surgery followed by Oncotype Dx testing.  She has completed surgery, healing very well. Oncotype dx of 14, no role for adjuvant chemotherapy.  She is now here status post adjuvant radiation and is going to initiate antiestrogen therapy.  She is hoping to proceed with aromatase inhibitors.  I once again reviewed the mechanism of action, adverse effects including but not limited to fatigue, hot flashes, vaginal dryness, arthralgias and  bone density loss.  She is willing to try it and will return to clinic in approximately 3 months for survivorship visit/toxicity check.  She had a bone density recently which showed normal bone density and encouraged some weightbearing exercises, vitamin D supplementation as recommended by nephrology team.   Total time: 30 min All questions were answered. The patient knows to call the clinic with any problems, questions or concerns.    Rachel Moulds, MD 08/06/23

## 2023-08-08 ENCOUNTER — Other Ambulatory Visit: Payer: Self-pay

## 2023-08-08 ENCOUNTER — Encounter (HOSPITAL_COMMUNITY): Payer: Self-pay | Admitting: Vascular Surgery

## 2023-08-08 ENCOUNTER — Other Ambulatory Visit: Payer: Self-pay | Admitting: *Deleted

## 2023-08-08 DIAGNOSIS — N186 End stage renal disease: Secondary | ICD-10-CM | POA: Diagnosis not present

## 2023-08-08 DIAGNOSIS — D631 Anemia in chronic kidney disease: Secondary | ICD-10-CM | POA: Diagnosis not present

## 2023-08-08 DIAGNOSIS — Z992 Dependence on renal dialysis: Secondary | ICD-10-CM | POA: Diagnosis not present

## 2023-08-08 DIAGNOSIS — N2581 Secondary hyperparathyroidism of renal origin: Secondary | ICD-10-CM | POA: Diagnosis not present

## 2023-08-08 MED ORDER — ANASTROZOLE 1 MG PO TABS: 1.0000 mg | ORAL_TABLET | Freq: Every day | ORAL | 3 refills | Status: DC

## 2023-08-08 NOTE — Anesthesia Preprocedure Evaluation (Addendum)
Anesthesia Evaluation  Patient identified by MRN, date of birth, ID band Patient awake    Reviewed: Allergy & Precautions, NPO status , Patient's Chart, lab work & pertinent test results  History of Anesthesia Complications (+) history of anesthetic complications (reports difficulty getting oxygen saturation up, neuropathy)  Airway Mallampati: II  TM Distance: >3 FB Neck ROM: Full   Comment: Previous grade I view with MAC 3, easy mask Dental  (+) Dental Advisory Given, Teeth Intact   Pulmonary neg shortness of breath, asthma , sleep apnea , neg COPD, neg recent URI S/p VATS wedge resection   Pulmonary exam normal breath sounds clear to auscultation       Cardiovascular hypertension, (-) angina + CAD, +CHF (diastolic) and + DVT (2019)  (-) Cardiac Stents and (-) CABG Normal cardiovascular exam+ dysrhythmias (on amiodarone and Eliquis) Atrial Fibrillation  Rhythm:Regular Rate:Normal  H/o pericarditis 08/2019, HLD  Stress Test 09/20/2021:   Findings are consistent with no prior ischemia and no prior myocardial infarction. The study is low risk.   No ST deviation was noted.   LV perfusion is normal.   Left ventricular function is normal. Nuclear stress EF: 83 %. The left ventricular ejection fraction is hyperdynamic (>65%). End diastolic cavity size is normal. End systolic cavity size is normal.   Prior study done without evidence of ischemia or infarction.   Hyperkinetic LV. Diaphragmatic attenuation noted. Study quality is fair.   Negative for ischemia or infarction, decreased sensitivity in the setting of artifact.  TTE 03/16/2021:  1. Left ventricular ejection fraction, by estimation, is 70 to 75%. The left ventricle has hyperdynamic function. The left ventricle has no regional wall motion abnormalities. There is moderate concentric left ventricular hypertrophy. Left ventricular diastolic parameters were normal.   2. Right  ventricular systolic function is normal. The right ventricular size is normal. There is normal pulmonary artery systolic pressure.   3. The mitral valve is grossly normal. Trivial mitral valve regurgitation. No evidence of mitral stenosis.   4. The aortic valve is tricuspid. There is moderate calcification of the aortic valve. There is mild thickening of the aortic valve. Aortic valve regurgitation is not visualized. Mild to moderate aortic valve sclerosis/calcification is present, without  any evidence of aortic stenosis.   5. The inferior vena cava is normal in size with <50% respiratory variability, suggesting right atrial pressure of 8 mmHg.      Neuro/Psych  Headaches, neg Seizures    GI/Hepatic negative GI ROS, Neg liver ROS,,,  Endo/Other  diabetes, Type 2  hyperparathyroidism  Renal/GU ESRF and DialysisRenal disease (last HD yesterday)     Musculoskeletal  (+) Arthritis ,    Abdominal  (+) + obese  Peds  Hematology  (+) Blood dyscrasia, anemia   Anesthesia Other Findings   Reproductive/Obstetrics PCOS                             Anesthesia Physical Anesthesia Plan  ASA: 3  Anesthesia Plan: General   Post-op Pain Management: Tylenol PO (pre-op)*   Induction: Intravenous  PONV Risk Score and Plan: 3 and Ondansetron, Dexamethasone and Treatment may vary due to age or medical condition  Airway Management Planned: LMA  Additional Equipment:   Intra-op Plan:   Post-operative Plan:   Informed Consent: I have reviewed the patients History and Physical, chart, labs and discussed the procedure including the risks, benefits and alternatives for the proposed anesthesia with the patient  or authorized representative who has indicated his/her understanding and acceptance.     Dental advisory given  Plan Discussed with: CRNA  Anesthesia Plan Comments: (PAT note by Antionette Poles, PA-C: Follows with cardiology for history of paroxysmal A-fib  on Eliquis and amiodarone, cardiac tamponade s/p pericardial window 2020, history of provoked DVT 2019, HTN, CAD (negative nuclear stress 09/2021).  Last seen by Robin Searing, NP 09/27/2022, stable at that time, no changes made to management, 1 year follow-up recommended.  She was preciously cleared by cardiology to hold Eliquis for 3 days prior to Right breast lumpectomy on 05/08/23. She subsequently completed adjuvant radiation on 07/17/23. Adjuvant chemo not indicated.  Patient was instructed by vascular surgery to hold Eliquis 3 days prior to procedure.  OSA, intolerant to CPAP.  ESRD on HD Monday Wednesday Friday.  Patient will need day of surgery labs and evaluation.  EKG 09/27/22: Sinus tachycardia.  Rate 102.  No significant change from prior.  Nuclear stress 09/20/2021:   Findings are consistent with no prior ischemia and no prior myocardial infarction. The study is low risk.   No ST deviation was noted.   LV perfusion is normal.   Left ventricular function is normal. Nuclear stress EF: 83 %. The left ventricular ejection fraction is hyperdynamic (>65%). End diastolic cavity size is normal. End systolic cavity size is normal.   Prior study done without evidence of ischemia or infarction.  Hyperkinetic LV. Diaphragmatic attenuation noted. Study quality is fair.  Negative for ischemia or infarction, decreased sensitivity in the setting of artifact.  TTE 03/16/2021: 1. Left ventricular ejection fraction, by estimation, is 70 to 75%. The  left ventricle has hyperdynamic function. The left ventricle has no  regional wall motion abnormalities. There is moderate concentric left  ventricular hypertrophy. Left ventricular  diastolic parameters were normal.  2. Right ventricular systolic function is normal. The right ventricular  size is normal. There is normal pulmonary artery systolic pressure.  3. The mitral valve is grossly normal. Trivial mitral valve  regurgitation.  No evidence of mitral stenosis.  4. The aortic valve is tricuspid. There is moderate calcification of the  aortic valve. There is mild thickening of the aortic valve. Aortic valve  regurgitation is not visualized. Mild to moderate aortic valve  sclerosis/calcification is present, without  any evidence of aortic stenosis.  5. The inferior vena cava is normal in size with <50% respiratory  variability, suggesting right atrial pressure of 8 mmHg.   Comparison(s): No significant change from prior study.   Conclusion(s)/Recommendation(s): Otherwise normal echocardiogram, with  minor abnormalities described in the report.    )        Anesthesia Quick Evaluation

## 2023-08-08 NOTE — Progress Notes (Signed)
Anesthesia Chart Review: Same day workup  Follows with cardiology for history of paroxysmal A-fib on Eliquis and amiodarone, cardiac tamponade s/p pericardial window 2020, history of provoked DVT 2019, HTN, CAD (negative nuclear stress 09/2021).  Last seen by Robin Searing, NP 09/27/2022, stable at that time, no changes made to management, 1 year follow-up recommended.  She was preciously cleared by cardiology to hold Eliquis for 3 days prior to Right breast lumpectomy on 05/08/23. She subsequently completed adjuvant radiation on 07/17/23. Adjuvant chemo not indicated.   Patient was instructed by vascular surgery to hold Eliquis 3 days prior to procedure.   OSA, intolerant to CPAP.   ESRD on HD Monday Wednesday Friday.   Patient will need day of surgery labs and evaluation.   EKG 09/27/22: Sinus tachycardia.  Rate 102.  No significant change from prior.  Nuclear stress 09/20/2021:   Findings are consistent with no prior ischemia and no prior myocardial infarction. The study is low risk.   No ST deviation was noted.   LV perfusion is normal.   Left ventricular function is normal. Nuclear stress EF: 83 %. The left ventricular ejection fraction is hyperdynamic (>65%). End diastolic cavity size is normal. End systolic cavity size is normal.   Prior study done without evidence of ischemia or infarction.   Hyperkinetic LV. Diaphragmatic attenuation noted. Study quality is fair.   Negative for ischemia or infarction, decreased sensitivity in the setting of artifact.   TTE 03/16/2021:  1. Left ventricular ejection fraction, by estimation, is 70 to 75%. The  left ventricle has hyperdynamic function. The left ventricle has no  regional wall motion abnormalities. There is moderate concentric left  ventricular hypertrophy. Left ventricular  diastolic parameters were normal.   2. Right ventricular systolic function is normal. The right ventricular  size is normal. There is normal pulmonary artery  systolic pressure.   3. The mitral valve is grossly normal. Trivial mitral valve  regurgitation. No evidence of mitral stenosis.   4. The aortic valve is tricuspid. There is moderate calcification of the  aortic valve. There is mild thickening of the aortic valve. Aortic valve  regurgitation is not visualized. Mild to moderate aortic valve  sclerosis/calcification is present, without  any evidence of aortic stenosis.   5. The inferior vena cava is normal in size with <50% respiratory  variability, suggesting right atrial pressure of 8 mmHg.   Comparison(s): No significant change from prior study.   Conclusion(s)/Recommendation(s): Otherwise normal echocardiogram, with  minor abnormalities described in the report.       Zannie Cove [facility] Short Stay Center/Anesthesiology Phone [phone] 08/08/2023 9:13 AM

## 2023-08-08 NOTE — Progress Notes (Signed)
PCP - Ardean Larsen  Cardiologist - Carolanne Grumbling  PPM/ICD - denies Device Orders -  Rep Notified -   Chest x-ray - denies EKG - 09-27-22 Stress Test -  ECHO - 03-16-21 Cardiac Cath -   CPAP - Does not use   DM reports does not check daily and her MD no longer check A1c stated last one was around 6  Blood Thinner Instructions: eliquis last dose 08-07-23 Aspirin Instructions: n/a  ERAS Protcol - NPO  COVID TEST- n/a  Anesthesia review: yes Cardiac hx, ESRD  Patient verbally denies any shortness of breath, fever, cough and chest pain during phone call   -------------  SDW INSTRUCTIONS given:  Your procedure is scheduled on Sept 9, 2024.Marland Kitchen  Report to [facility] Main Entrance "A" at 9:45 A.M., and check in at the Admitting office.  Call this number if you have problems the morning of surgery:  [phone]   Remember:  Do not eat or drink after midnight the night before your surgery  Y    Take these medicines the morning of surgery with A SIP OF WATER  acetaminophen (TYLENOL)   amiodarone (PACERONE)   colchicine   midodrine (PROAMATINE)    As of today, STOP taking any Aspirin (unless otherwise instructed by your surgeon) Aleve, Naproxen, Ibuprofen, Motrin, Advil, Goody's, BC's, all herbal medications, fish oil, and all vitamins.                      Do not wear jewelry, make up, or nail polish            Do not wear lotions, powders, perfumes/colognes, or deodorant.            Do not shave 48 hours prior to surgery.  Men may shave face and neck.            Do not bring valuables to the hospital.            [facility] is not responsible for any belongings or valuables.  Do NOT Smoke (Tobacco/Vaping) 24 hours prior to your procedure If you use a CPAP at night, you may bring all equipment for your overnight stay.   Contacts, glasses, dentures or bridgework may not be worn into surgery.      For patients admitted to the hospital, discharge time will be determined by  your treatment team.   Patients discharged the day of surgery will not be allowed to drive home, and someone needs to stay with them for 24 hours.    Special instructions:   [facility]- Preparing For Surgery  Before surgery, you can play an important role. Because skin is not sterile, your skin needs to be as free of germs as possible. You can reduce the number of germs on your skin by washing with CHG (chlorahexidine gluconate) Soap before surgery.  CHG is an antiseptic cleaner which kills germs and bonds with the skin to continue killing germs even after washing.    Oral Hygiene is also important to reduce your risk of infection.  Remember - BRUSH YOUR TEETH THE MORNING OF SURGERY WITH YOUR REGULAR TOOTHPASTE  Please do not use if you have an allergy to CHG or antibacterial soaps. If your skin becomes reddened/irritated stop using the CHG.  Do not shave (including legs and underarms) for at least 48 hours prior to first CHG shower. It is OK to shave your face.  Please follow these instructions carefully.   Shower the  NIGHT BEFORE SURGERY and the MORNING OF SURGERY with DIAL Soap.   Pat yourself dry with a CLEAN TOWEL.  Wear CLEAN PAJAMAS to bed the night before surgery  Place CLEAN SHEETS on your bed the night of your first shower and DO NOT SLEEP WITH PETS.   Day of Surgery: Please shower morning of surgery  Wear Clean/Comfortable clothing the morning of surgery Do not apply any deodorants/lotions.   Remember to brush your teeth WITH YOUR REGULAR TOOTHPASTE.   Questions were answered. Patient verbalized understanding of instructions.

## 2023-08-11 ENCOUNTER — Other Ambulatory Visit: Payer: Self-pay

## 2023-08-11 ENCOUNTER — Ambulatory Visit (HOSPITAL_BASED_OUTPATIENT_CLINIC_OR_DEPARTMENT_OTHER): Payer: Medicare Other | Admitting: Physician Assistant

## 2023-08-11 ENCOUNTER — Encounter (HOSPITAL_COMMUNITY): Payer: Self-pay | Admitting: Vascular Surgery

## 2023-08-11 ENCOUNTER — Encounter (HOSPITAL_COMMUNITY)
Admission: RE | Disposition: A | Discharge status: Home / Self Care | Payer: Self-pay | Source: Home / Self Care | Attending: Vascular Surgery

## 2023-08-11 ENCOUNTER — Ambulatory Visit (HOSPITAL_COMMUNITY)
Admission: RE | Admit: 2023-08-11 | Discharge: 2023-08-11 | Disposition: A | Discharge status: Home / Self Care | Payer: Medicare Other | Attending: Vascular Surgery | Admitting: Vascular Surgery

## 2023-08-11 ENCOUNTER — Ambulatory Visit (HOSPITAL_COMMUNITY): Payer: Self-pay | Admitting: Physician Assistant

## 2023-08-11 DIAGNOSIS — L98499 Non-pressure chronic ulcer of skin of other sites with unspecified severity: Secondary | ICD-10-CM | POA: Insufficient documentation

## 2023-08-11 DIAGNOSIS — Z7901 Long term (current) use of anticoagulants: Secondary | ICD-10-CM | POA: Diagnosis not present

## 2023-08-11 DIAGNOSIS — N186 End stage renal disease: Secondary | ICD-10-CM

## 2023-08-11 DIAGNOSIS — E1122 Type 2 diabetes mellitus with diabetic chronic kidney disease: Secondary | ICD-10-CM | POA: Insufficient documentation

## 2023-08-11 DIAGNOSIS — Z992 Dependence on renal dialysis: Secondary | ICD-10-CM | POA: Insufficient documentation

## 2023-08-11 DIAGNOSIS — Z79899 Other long term (current) drug therapy: Secondary | ICD-10-CM | POA: Diagnosis not present

## 2023-08-11 DIAGNOSIS — I480 Paroxysmal atrial fibrillation: Secondary | ICD-10-CM | POA: Insufficient documentation

## 2023-08-11 DIAGNOSIS — G4733 Obstructive sleep apnea (adult) (pediatric): Secondary | ICD-10-CM | POA: Insufficient documentation

## 2023-08-11 DIAGNOSIS — I5030 Unspecified diastolic (congestive) heart failure: Secondary | ICD-10-CM | POA: Insufficient documentation

## 2023-08-11 DIAGNOSIS — E11622 Type 2 diabetes mellitus with other skin ulcer: Secondary | ICD-10-CM | POA: Insufficient documentation

## 2023-08-11 DIAGNOSIS — Z86718 Personal history of other venous thrombosis and embolism: Secondary | ICD-10-CM | POA: Insufficient documentation

## 2023-08-11 DIAGNOSIS — I2510 Atherosclerotic heart disease of native coronary artery without angina pectoris: Secondary | ICD-10-CM | POA: Diagnosis not present

## 2023-08-11 DIAGNOSIS — E282 Polycystic ovarian syndrome: Secondary | ICD-10-CM | POA: Diagnosis not present

## 2023-08-11 DIAGNOSIS — Z923 Personal history of irradiation: Secondary | ICD-10-CM | POA: Insufficient documentation

## 2023-08-11 DIAGNOSIS — I5032 Chronic diastolic (congestive) heart failure: Secondary | ICD-10-CM | POA: Diagnosis not present

## 2023-08-11 DIAGNOSIS — E785 Hyperlipidemia, unspecified: Secondary | ICD-10-CM | POA: Diagnosis not present

## 2023-08-11 DIAGNOSIS — T82898A Other specified complication of vascular prosthetic devices, implants and grafts, initial encounter: Secondary | ICD-10-CM | POA: Diagnosis not present

## 2023-08-11 DIAGNOSIS — I132 Hypertensive heart and chronic kidney disease with heart failure and with stage 5 chronic kidney disease, or end stage renal disease: Secondary | ICD-10-CM | POA: Insufficient documentation

## 2023-08-11 DIAGNOSIS — D631 Anemia in chronic kidney disease: Secondary | ICD-10-CM | POA: Diagnosis not present

## 2023-08-11 DIAGNOSIS — G4730 Sleep apnea, unspecified: Secondary | ICD-10-CM | POA: Insufficient documentation

## 2023-08-11 DIAGNOSIS — J45909 Unspecified asthma, uncomplicated: Secondary | ICD-10-CM | POA: Diagnosis not present

## 2023-08-11 DIAGNOSIS — N2581 Secondary hyperparathyroidism of renal origin: Secondary | ICD-10-CM | POA: Diagnosis not present

## 2023-08-11 DIAGNOSIS — N185 Chronic kidney disease, stage 5: Secondary | ICD-10-CM | POA: Diagnosis not present

## 2023-08-11 HISTORY — DX: Myoneural disorder, unspecified: G70.9

## 2023-08-11 HISTORY — PX: REVISON OF ARTERIOVENOUS FISTULA: SHX6074

## 2023-08-11 HISTORY — DX: Polyneuropathy, unspecified: G62.9

## 2023-08-11 LAB — POCT I-STAT, CHEM 8
BUN: 28 mg/dL — ABNORMAL HIGH (ref 8–23)
Calcium, Ion: 0.9 mmol/L — ABNORMAL LOW (ref 1.15–1.40)
Chloride: 98 mmol/L (ref 98–111)
Creatinine, Ser: 6.9 mg/dL — ABNORMAL HIGH (ref 0.44–1.00)
Glucose, Bld: 78 mg/dL (ref 70–99)
HCT: 41 % (ref 36.0–46.0)
Hemoglobin: 13.9 g/dL (ref 12.0–15.0)
Potassium: 4 mmol/L (ref 3.5–5.1)
Sodium: 136 mmol/L (ref 135–145)
TCO2: 30 mmol/L (ref 22–32)

## 2023-08-11 LAB — GLUCOSE, CAPILLARY
Glucose-Capillary: 76 mg/dL (ref 70–99)
Glucose-Capillary: 72 mg/dL (ref 70–99)
Glucose-Capillary: 85 mg/dL (ref 70–99)
Glucose-Capillary: 72 mg/dL (ref 70–99)
Glucose-Capillary: 127 mg/dL — ABNORMAL HIGH (ref 70–99)

## 2023-08-11 SURGERY — REVISON OF ARTERIOVENOUS FISTULA
Anesthesia: General | Site: Arm Lower | Laterality: Left

## 2023-08-11 MED ORDER — CEFAZOLIN SODIUM-DEXTROSE 2-4 GM/100ML-% IV SOLN
2.0000 g | INTRAVENOUS | Status: AC
  Administered 2023-08-11: 2 g via INTRAVENOUS
  Filled 2023-08-11: qty 100

## 2023-08-11 MED ORDER — DEXTROSE 50 % IV SOLN: 25.0000 mL | Freq: Once | INTRAVENOUS | Status: AC

## 2023-08-11 MED ORDER — PHENYLEPHRINE HCL (PRESSORS) 10 MG/ML IV SOLN
INTRAVENOUS | Status: AC
  Filled 2023-08-11: qty 1

## 2023-08-11 MED ORDER — PROTAMINE SULFATE 10 MG/ML IV SOLN
INTRAVENOUS | Status: AC
  Filled 2023-08-11: qty 5

## 2023-08-11 MED ORDER — LIDOCAINE 2% (20 MG/ML) 5 ML SYRINGE
INTRAMUSCULAR | Status: AC
  Filled 2023-08-11: qty 5

## 2023-08-11 MED ORDER — HEMOSTATIC AGENTS (NO CHARGE) OPTIME
TOPICAL | Status: DC | PRN
Start: 2023-08-11 — End: 2023-08-11
  Administered 2023-08-11: 1 via TOPICAL

## 2023-08-11 MED ORDER — EPHEDRINE 5 MG/ML INJ
INTRAVENOUS | Status: AC
  Filled 2023-08-11: qty 5

## 2023-08-11 MED ORDER — ALBUMIN HUMAN 5 % IV SOLN: 12.5000 g | Freq: Once | INTRAVENOUS | Status: AC

## 2023-08-11 MED ORDER — PHENYLEPHRINE 80 MCG/ML (10ML) SYRINGE FOR IV PUSH (FOR BLOOD PRESSURE SUPPORT)
PREFILLED_SYRINGE | INTRAVENOUS | Status: DC | PRN
  Administered 2023-08-11 (×2): 160 ug via INTRAVENOUS
  Administered 2023-08-11: 80 ug via INTRAVENOUS
  Administered 2023-08-11 (×2): 160 ug via INTRAVENOUS
  Administered 2023-08-11: 80 ug via INTRAVENOUS

## 2023-08-11 MED ORDER — HEPARIN 6000 UNIT IRRIGATION SOLUTION
Status: DC | PRN
  Administered 2023-08-11: 1

## 2023-08-11 MED ORDER — FENTANYL CITRATE (PF) 100 MCG/2ML IJ SOLN: 25.0000 ug | INTRAMUSCULAR | Status: DC | PRN

## 2023-08-11 MED ORDER — DEXTROSE 50 % IV SOLN
INTRAVENOUS | Status: AC
  Administered 2023-08-11: 25 mL via INTRAVENOUS
  Filled 2023-08-11: qty 50

## 2023-08-11 MED ORDER — ONDANSETRON HCL 4 MG/2ML IJ SOLN
INTRAMUSCULAR | Status: DC | PRN
  Administered 2023-08-11: 4 mg via INTRAVENOUS

## 2023-08-11 MED ORDER — PROTAMINE SULFATE 10 MG/ML IV SOLN
INTRAVENOUS | Status: DC | PRN
Start: 2023-08-11 — End: 2023-08-11
  Administered 2023-08-11: 30 mg via INTRAVENOUS

## 2023-08-11 MED ORDER — LIDOCAINE-EPINEPHRINE (PF) 1 %-1:200000 IJ SOLN
INTRAMUSCULAR | Status: AC
  Filled 2023-08-11: qty 30

## 2023-08-11 MED ORDER — CHLORHEXIDINE GLUCONATE 0.12 % MT SOLN
15.0000 mL | Freq: Once | OROMUCOSAL | Status: AC
  Administered 2023-08-11: 15 mL via OROMUCOSAL
  Filled 2023-08-11: qty 15

## 2023-08-11 MED ORDER — DROPERIDOL 2.5 MG/ML IJ SOLN: 0.6250 mg | Freq: Once | INTRAMUSCULAR | Status: AC | PRN

## 2023-08-11 MED ORDER — PROPOFOL 10 MG/ML IV BOLUS
INTRAVENOUS | Status: AC
  Filled 2023-08-11: qty 20

## 2023-08-11 MED ORDER — DROPERIDOL 2.5 MG/ML IJ SOLN
INTRAMUSCULAR | Status: AC
  Administered 2023-08-11: 0.625 mg via INTRAVENOUS
  Filled 2023-08-11: qty 2

## 2023-08-11 MED ORDER — OXYCODONE HCL 5 MG/5ML PO SOLN: 5.0000 mg | Freq: Once | ORAL | Status: DC | PRN

## 2023-08-11 MED ORDER — HEPARIN SODIUM (PORCINE) 1000 UNIT/ML IJ SOLN
INTRAMUSCULAR | Status: DC | PRN
Start: 2023-08-11 — End: 2023-08-11
  Administered 2023-08-11 (×2): 5000 [IU] via INTRAVENOUS

## 2023-08-11 MED ORDER — PROPOFOL 10 MG/ML IV BOLUS
INTRAVENOUS | Status: DC | PRN
  Administered 2023-08-11: 150 mg via INTRAVENOUS

## 2023-08-11 MED ORDER — LIDOCAINE 2% (20 MG/ML) 5 ML SYRINGE
INTRAMUSCULAR | Status: DC | PRN
  Administered 2023-08-11: 100 mg via INTRAVENOUS

## 2023-08-11 MED ORDER — FENTANYL CITRATE (PF) 250 MCG/5ML IJ SOLN
INTRAMUSCULAR | Status: AC
  Filled 2023-08-11: qty 5

## 2023-08-11 MED ORDER — LIDOCAINE HCL (PF) 1 % IJ SOLN
INTRAMUSCULAR | Status: AC
  Filled 2023-08-11: qty 30

## 2023-08-11 MED ORDER — PROPOFOL 1000 MG/100ML IV EMUL
INTRAVENOUS | Status: AC
  Filled 2023-08-11: qty 100

## 2023-08-11 MED ORDER — PHENYLEPHRINE 80 MCG/ML (10ML) SYRINGE FOR IV PUSH (FOR BLOOD PRESSURE SUPPORT)
PREFILLED_SYRINGE | INTRAVENOUS | Status: AC
  Filled 2023-08-11: qty 10

## 2023-08-11 MED ORDER — ALBUMIN HUMAN 5 % IV SOLN: INTRAVENOUS | Status: DC | PRN

## 2023-08-11 MED ORDER — ALBUMIN HUMAN 5 % IV SOLN
INTRAVENOUS | Status: AC
  Administered 2023-08-11: 12.5 g via INTRAVENOUS
  Filled 2023-08-11: qty 250

## 2023-08-11 MED ORDER — FENTANYL CITRATE (PF) 250 MCG/5ML IJ SOLN
INTRAMUSCULAR | Status: DC | PRN
  Administered 2023-08-11 (×2): 50 ug via INTRAVENOUS

## 2023-08-11 MED ORDER — ACETAMINOPHEN 500 MG PO TABS
1000.0000 mg | ORAL_TABLET | Freq: Once | ORAL | Status: AC
  Administered 2023-08-11: 1000 mg via ORAL
  Filled 2023-08-11: qty 2

## 2023-08-11 MED ORDER — FENTANYL CITRATE (PF) 100 MCG/2ML IJ SOLN
INTRAMUSCULAR | Status: AC
  Administered 2023-08-11: 25 ug via INTRAVENOUS
  Filled 2023-08-11: qty 2

## 2023-08-11 MED ORDER — ORAL CARE MOUTH RINSE: 15.0000 mL | Freq: Once | OROMUCOSAL | Status: AC

## 2023-08-11 MED ORDER — SODIUM CHLORIDE 0.9 % IV SOLN: INTRAVENOUS | Status: DC

## 2023-08-11 MED ORDER — DEXAMETHASONE SODIUM PHOSPHATE 10 MG/ML IJ SOLN
INTRAMUSCULAR | Status: DC | PRN
Start: 2023-08-11 — End: 2023-08-11
  Administered 2023-08-11: 5 mg via INTRAVENOUS

## 2023-08-11 MED ORDER — HEPARIN 6000 UNIT IRRIGATION SOLUTION
Status: AC
  Filled 2023-08-11: qty 500

## 2023-08-11 MED ORDER — OXYCODONE-ACETAMINOPHEN 5-325 MG PO TABS: 1.0000 | ORAL_TABLET | Freq: Four times a day (QID) | ORAL | 0 refills | Status: DC | PRN

## 2023-08-11 MED ORDER — ONDANSETRON HCL 4 MG/2ML IJ SOLN
INTRAMUSCULAR | Status: AC
  Filled 2023-08-11: qty 2

## 2023-08-11 MED ORDER — OXYCODONE HCL 5 MG PO TABS: 5.0000 mg | ORAL_TABLET | Freq: Once | ORAL | Status: DC | PRN

## 2023-08-11 MED ORDER — 0.9 % SODIUM CHLORIDE (POUR BTL) OPTIME
TOPICAL | Status: DC | PRN
  Administered 2023-08-11: 1000 mL

## 2023-08-11 MED ORDER — PHENYLEPHRINE HCL-NACL 20-0.9 MG/250ML-% IV SOLN
INTRAVENOUS | Status: DC | PRN
  Administered 2023-08-11: 50 ug/min via INTRAVENOUS

## 2023-08-11 SURGICAL SUPPLY — 44 items
ADH SKN CLS APL DERMABOND .7 (GAUZE/BANDAGES/DRESSINGS) ×1
AGENT HMST SPONGE THK3/8 (HEMOSTASIS)
ARMBAND PINK RESTRICT EXTREMIT (MISCELLANEOUS) ×2 IMPLANT
BAG COUNTER SPONGE SURGICOUNT (BAG) ×2 IMPLANT
BAG SPNG CNTER NS LX DISP (BAG) ×1
BNDG CMPR MD 5X2 ELC HKLP STRL (GAUZE/BANDAGES/DRESSINGS) ×1
BNDG ELASTIC 2 VLCR STRL LF (GAUZE/BANDAGES/DRESSINGS) IMPLANT
CANISTER SUCT 3000ML PPV (MISCELLANEOUS) ×2 IMPLANT
CLIP TI MEDIUM 6 (CLIP) ×2 IMPLANT
CLIP TI WIDE RED SMALL 6 (CLIP) ×2 IMPLANT
COVER PROBE W GEL 5X96 (DRAPES) IMPLANT
DERMABOND ADVANCED .7 DNX12 (GAUZE/BANDAGES/DRESSINGS) ×2 IMPLANT
ELECT REM PT RETURN 9FT ADLT (ELECTROSURGICAL) ×1
ELECTRODE REM PT RTRN 9FT ADLT (ELECTROSURGICAL) ×2 IMPLANT
GAUZE 4X4 16PLY ~~LOC~~+RFID DBL (SPONGE) IMPLANT
GAUZE SPONGE 4X4 12PLY STRL (GAUZE/BANDAGES/DRESSINGS) IMPLANT
GLOVE BIO SURGEON STRL SZ7.5 (GLOVE) ×2 IMPLANT
GLOVE BIOGEL PI IND STRL 8 (GLOVE) ×2 IMPLANT
GOWN STRL REUS W/ TWL LRG LVL3 (GOWN DISPOSABLE) ×4 IMPLANT
GOWN STRL REUS W/ TWL XL LVL3 (GOWN DISPOSABLE) ×4 IMPLANT
GOWN STRL REUS W/TWL LRG LVL3 (GOWN DISPOSABLE) ×2
GOWN STRL REUS W/TWL XL LVL3 (GOWN DISPOSABLE) ×2
HEMOSTAT SNOW SURGICEL 2X4 (HEMOSTASIS) IMPLANT
HEMOSTAT SPONGE AVITENE ULTRA (HEMOSTASIS) IMPLANT
KIT BASIN OR (CUSTOM PROCEDURE TRAY) ×2 IMPLANT
KIT TURNOVER KIT B (KITS) ×2 IMPLANT
LOOP VASCULAR MINI 18 RED (MISCELLANEOUS) ×1
NS IRRIG 1000ML POUR BTL (IV SOLUTION) ×2 IMPLANT
PACK CV ACCESS (CUSTOM PROCEDURE TRAY) ×2 IMPLANT
PAD ARMBOARD 7.5X6 YLW CONV (MISCELLANEOUS) ×4 IMPLANT
SLING ARM FOAM STRAP LRG (SOFTGOODS) IMPLANT
SLING ARM FOAM STRAP MED (SOFTGOODS) IMPLANT
SPIKE FLUID TRANSFER (MISCELLANEOUS) ×2 IMPLANT
STAPLER VISISTAT 35W (STAPLE) IMPLANT
SUT MNCRL AB 4-0 PS2 18 (SUTURE) ×2 IMPLANT
SUT PROLENE 5 0 C 1 24 (SUTURE) IMPLANT
SUT PROLENE 6 0 BV (SUTURE) IMPLANT
SUT PROLENE 7 0 BV 1 (SUTURE) IMPLANT
SUT VIC AB 3-0 SH 27 (SUTURE)
SUT VIC AB 3-0 SH 27X BRD (SUTURE) ×2 IMPLANT
TOWEL GREEN STERILE (TOWEL DISPOSABLE) ×2 IMPLANT
UNDERPAD 30X36 HEAVY ABSORB (UNDERPADS AND DIAPERS) ×2 IMPLANT
VASCULAR TIE MINI RED 18IN STL (MISCELLANEOUS) IMPLANT
WATER STERILE IRR 1000ML POUR (IV SOLUTION) ×2 IMPLANT

## 2023-08-11 NOTE — Op Note (Signed)
    NAME: Brenda Arnold    MRN: [id number] DOB: 1955/12/06    DATE OF OPERATION: 08/11/2023  PREOP DIAGNOSIS:    ESRD on HD Skin ulceration over aneurysm of left radiocephalic AV fistula  POSTOP DIAGNOSIS:    Same  PROCEDURE:    L radiocephalic fistula plication  SURGEON: Daria Pastures  ASSIST: Dr. Sherald Hess  ANESTHESIA: General/LMA   EBL: 50cc  INDICATIONS:   Brenda Arnold is a 67 y.o. female with end-stage renal disease on hemodialysis Monday Wednesday Friday with an area of skin ulceration and aneurysm over her left radiocephalic AV fistula.   FINDINGS:   Skin ulceration overlying a large aneurysm of left radiocephalic AVF  TECHNIQUE:   The patient was brought to the operating room positioned supine on the operating room table.  General anesthesia was administered and LMA placed.  The left arm was prepped and draped in the usual sterile fashion.  A timeout was performed and no concerns were expressed. An elliptical incision was made overlying the large aneurysm.  The skin overlying the aneurysm was excised.  The aneurysm was circumferentially dissected.  Branches were controlled and ligated with 2-0 silk ties.  Proximal and distal control was obtained, IV heparin was administered and fistula clamps were placed proximal and distal to the aneurysm.  The anterior wall of the aneurysm was then opened and the edges were resected.  This was then plicated with a 5-0 running Prolene suture.  Prior to completion the fistula was forward flushed and backbled.  There were few areas that need repair sutures which was done with 5-0 Prolene.  Hemostasis was achieved with electrocautery and the wound was copiously irrigated with saline.  There were still some areas of slow oozing which resolved with pressure and administration of protamine.  The wound was then closed in layers with 2-0 Vicryl and 4-0 Monocryl for the skin.  Daria Pastures, MD [facility] DATE OF DICTATION:   08/11/2023

## 2023-08-11 NOTE — Transfer of Care (Signed)
Immediate Anesthesia Transfer of Care Note  Patient: Brenda Arnold  Procedure(s) Performed: PLICATION OF LEFT ARM ARTERIOVENOUS FISTULA (Left: Arm Lower)  Patient Location: PACU  Anesthesia Type:General  Level of Consciousness: awake, oriented, and sedated  Airway & Oxygen Therapy: Patient Spontanous Breathing and Patient connected to face mask oxygen  Post-op Assessment: Report given to RN and Post -op Vital signs reviewed and stable  Post vital signs: Reviewed and stable  Last Vitals:  Vitals Value Taken Time  BP 109/73 08/11/23 1700  Temp    Pulse 67 08/11/23 1704  Resp 15 08/11/23 1704  SpO2 100 % 08/11/23 1704  Vitals shown include unfiled device data.  Last Pain:  Vitals:   08/11/23 1056  PainSc: 5          Complications: No notable events documented.

## 2023-08-11 NOTE — Anesthesia Procedure Notes (Addendum)
Procedure Name: LMA Insertion Date/Time: 08/11/2023 2:44 PM  Performed by: Alwyn Ren, CRNAPre-anesthesia Checklist: Patient identified, Suction available, Patient being monitored and Timeout performed Patient Re-evaluated:Patient Re-evaluated prior to induction Oxygen Delivery Method: Circle system utilized Induction Type: IV induction LMA Size: 4.0 Number of attempts: 1 Tube secured with: Tape

## 2023-08-11 NOTE — Discharge Instructions (Signed)
[facility]  Discharge Instructions  AV Fistula or Graft Surgery for Dialysis Access  Please refer to the following instructions for your post-procedure care. Your surgeon or physician assistant will discuss any changes with you.  Activity  You may drive the day following your surgery, if you are comfortable and no longer taking prescription pain medication. Resume full activity as the soreness in your incision resolves.  Bathing/Showering  You may shower after you go home. Keep your incision dry for 48 hours. Do not soak in a bathtub, hot tub, or swim until the incision heals completely. You may not shower if you have a hemodialysis catheter.  Incision Care  Clean your incision with mild soap and water after 48 hours. Pat the area dry with a clean towel. You do not need a bandage unless otherwise instructed. Do not apply any ointments or creams to your incision. You may have skin glue on your incision. Do not peel it off. It will come off on its own in about one week. Your arm may swell a bit after surgery. To reduce swelling use pillows to elevate your arm so it is above your heart. Your doctor will tell you if you need to lightly wrap your arm with an ACE bandage.  Diet  Resume your normal diet. There are not special food restrictions following this procedure. In order to heal from your surgery, it is CRITICAL to get adequate nutrition. Your body requires vitamins, minerals, and protein. Vegetables are the best source of vitamins and minerals. Vegetables also provide the perfect balance of protein. Processed food has little nutritional value, so try to avoid this.  Medications  Resume taking all of your medications. If your incision is causing pain, you may take over-the counter pain relievers such as acetaminophen (Tylenol). If you were prescribed a stronger pain medication, please be aware these medications can cause nausea and constipation. Prevent  nausea by taking the medication with a snack or meal. Avoid constipation by drinking plenty of fluids and eating foods with high amount of fiber, such as fruits, vegetables, and grains.  Do not take Tylenol if you are taking prescription pain medications.  Follow up Your surgeon may want to see you in the office following your access surgery. If so, this will be arranged at the time of your surgery.  Please call us immediately for any of the following conditions:  Increased pain, redness, drainage (pus) from your incision site Fever of 101 degrees or higher Severe or worsening pain at your incision site Hand pain or numbness.  Reduce your risk of vascular disease:  Stop smoking. If you would like help, call [facility] at 1-800-QUIT-NOW (570-395-3947) or [place] at [phone]  Manage your cholesterol Maintain a desired weight Control your diabetes Keep your blood pressure down  Dialysis  It will take several weeks to several months for your new dialysis access to be ready for use. Your surgeon will determine when it is okay to use it. Your nephrologist will continue to direct your dialysis. You can continue to use your Permcath until your new access is ready for use.   08/11/2023 Brenda Arnold [id number] August 12, 1956  Surgeon(s): Daria Pastures, MD Cephus Shelling, MD  Procedure(s): REVISION OF LEFT ARM ARTERIOVENOUS FISTULA   May stick graft immediately  X May stick graft on designated area only: okay to stick in distal area of graft away from Incision site  X Do not stick Left AV fistula  in the proximal forearm arm (where incision is located) for 6 weeks    If you have any questions, please call the office at [phone].

## 2023-08-11 NOTE — H&P (Signed)
History and Physical Interval Note:  08/11/2023 1:44 PM  Brenda Arnold  has presented today for surgery, with the diagnosis of ESRD.  The various methods of treatment have been discussed with the patient and family. After consideration of risks, benefits and other options for treatment, the patient has consented to  Procedure(s): REVISION OF LEFT ARM ARTERIOVENOUS FISTULA (Left) as a surgical intervention.  The patient's history has been reviewed, patient examined, no change in status, stable for surgery.  I have reviewed the patient's chart and labs.  Questions were answered to the patient's satisfaction.    Revision left arm AVF with ulcer.  Cephus Shelling     Patient name: Brenda Arnold      MRN: [id number]        DOB: February 15, 1956          Sex: female   REASON FOR CONSULT: Ulceration/aneurysmal AVF   HPI: Brenda Arnold is a 67 y.o. female, with atrial fibrillation, DM, end-stage renal disease on hemodialysis Monday Wednesday Friday that presents for evaluation of an ulceration and aneurysm over her left radiocephalic AV fistula.  This was placed in 2013 by Dr. Darrick Penna.  It has undergone previous revision with aneurysm resection by myself in 2021.  States she has had several friends recently that have had major bleeding events and she is very concerned.  Fistula's has been working well.       Past Medical History:  Diagnosis Date   Agatston coronary artery calcium score greater than 400      Ca score 3824 on CT 08/2021   Anemia     Arthritis     Asthma     Breast cancer (HCC) 04/04/2023   Complication of anesthesia      difficulty with getting oxygen saturation up- "patient was not aware"  Bottom of feet burning in PACU   Constipation      because of Iron   Diabetes mellitus      not on meds   DVT (deep venous thrombosis) (HCC) 2019    post hip replacement - right   Dyslipidemia     Epistaxis 11/05/2012   ESRD (end stage renal disease) on dialysis [facility])       "MWF; Rudene Anda" (09/15/2018)- started 02/13/2017   History of blood transfusion     HTN (hypertension)      pt denies this. She states her "BP runs low now"   Hyperparathyroidism due to renal insufficiency (HCC)     Hypotension     Obesity      s/p panniculectomy   Paroxysmal A-fib (HCC)      a. chronic coumadin;  b. 12/2009 Echo: EF 60-65%, Gr 1 DD.   PCOS (polycystic ovarian syndrome)     Pericarditis 08/2019    pericarditis with pericardial effusion, s/p right VATS/pericardial window 08/31/19   Pneumonia     Seasonal allergies     Sleep apnea      a. not using CPAP, last study  >8 yrs   Vitamin D deficiency                 Past Surgical History:  Procedure Laterality Date   ABDOMINAL HYSTERECTOMY        with panniculectomy   AV FISTULA PLACEMENT   09/02/2012    Procedure: ARTERIOVENOUS (AV) FISTULA CREATION;  Surgeon: Sherren Kerns, MD;  Location: [facility] OR;  Service: Vascular;  Laterality: Left;  Creation of Left Radial-Cephalic Fistula  BREAST BIOPSY Right 04/04/2023    [place] RT BREAST BX W LOC DEV 1ST LESION IMG BX SPEC US GUIDE 04/04/2023 [facility]   BREAST BIOPSY   05/06/2023    [place] RT RADIOACTIVE SEED LOC 05/06/2023 [facility]   BREAST LUMPECTOMY WITH RADIOACTIVE SEED LOCALIZATION Right 05/08/2023    Procedure: RIGHT BREAST LUMPECTOMY WITH RADIOACTIVE SEED LOCALIZATION;  Surgeon: Griselda Miner, MD;  Location: [facility] OR;  Service: General;  Laterality: Right;   COLONOSCOPY       COLONOSCOPY W/ BIOPSIES AND POLYPECTOMY       DILATION AND CURETTAGE OF UTERUS       KNEE ARTHROSCOPY Right     PANNICULECTOMY       REVISON OF ARTERIOVENOUS FISTULA Left 04/27/2014    Procedure: REVISON OF LEFT RADIAL-CEPHALIC ARTERIOVENOUS FISTULA;  Surgeon: Pryor Ochoa, MD;  Location: [facility] OR;  Service: Vascular;  Laterality: Left;   REVISON OF ARTERIOVENOUS FISTULA Left 01/13/2020    Procedure: REVISON OF ARTERIOVENOUS FISTULA;  Surgeon: Cephus Shelling, MD;  Location: [facility] OR;   Service: Vascular;  Laterality: Left;   TOTAL HIP ARTHROPLASTY Right 09/15/2018   TOTAL HIP ARTHROPLASTY Right 09/15/2018    Procedure: RIGHT TOTAL HIP ARTHROPLASTY ANTERIOR APPROACH;  Surgeon: Tarry Kos, MD;  Location: [facility] OR;  Service: Orthopedics;  Laterality: Right;   TOTAL HIP ARTHROPLASTY Left 11/23/2020    Procedure: LEFT TOTAL HIP ARTHROPLASTY ANTERIOR APPROACH;  Surgeon: Tarry Kos, MD;  Location: [facility] OR;  Service: Orthopedics;  Laterality: Left;  NEEDS RNFA PLEASE   UVULOPLASTY       VIDEO ASSISTED THORACOSCOPY (VATS)/WEDGE RESECTION Right 08/31/2019    Procedure: VIDEO ASSISTED THORACOSCOPY/ DRAINAGE OF PERICARDIAL EFFUSION/ PERICARDIAL WINDOW/ ABORTED PERICARDIOCENTESIS ;  Surgeon: Corliss Skains, MD;  Location: [facility] OR;  Service: Thoracic;  Laterality: Right;               Family History  Problem Relation Age of Onset   Lung cancer Father          died @ 37   Hypertension Mother     Diabetes Brother     Kidney disease Brother            SOCIAL HISTORY: Social History         Socioeconomic History   Marital status: Single      Spouse name: Not on file   Number of children: 0   Years of education: Not on file   Highest education level: Not on file  Occupational History   Occupation: CORRESPONDENT ADMIN.      Employer: [employer]  Tobacco Use   Smoking status: Never   Smokeless tobacco: Never  Vaping Use   Vaping status: Never Used  Substance and Sexual Activity   Alcohol use: No      Alcohol/week: 0.0 standard drinks of alcohol   Drug use: No   Sexual activity: Not on file      Comment: Hysterectomy  Other Topics Concern   Not on file  Social History Narrative    Lives in [place] alone. 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    Social Determinants of Health        Financial Resource Strain: Not on file  Food Insecurity: No Food Insecurity (05/08/2023)    Hunger Vital Sign     Worried About Running Out of  Food in the Last Year: Never true     Ran Out of Food in the Last  Year: Never true  Transportation Needs: No Transportation Needs (05/08/2023)    PRAPARE - Therapist, art (Medical): No     Lack of Transportation (Non-Medical): No  Physical Activity: Not on file  Stress: Not on file  Social Connections: Not on file  Intimate Partner Violence: Not At Risk (05/08/2023)    Humiliation, Afraid, Rape, and Kick questionnaire     Fear of Current or Ex-Partner: No     Emotionally Abused: No     Physically Abused: No     Sexually Abused: No      Allergies       Allergies  Allergen Reactions   Iodine Shortness Of Breath   Shellfish Allergy Shortness Of Breath      Other reaction(s): Breathing Problems   Shellfish-Derived Products Shortness Of Breath   Amlodipine Swelling and Other (See Comments)      Edema Other reaction(s): edema   Felodipine Swelling      Headache   Lisinopril Cough   Chlorhexidine Itching      CHG wipes              Current Outpatient Medications  Medication Sig Dispense Refill   acetaminophen (TYLENOL) 500 MG tablet Take 2 tablets (1,000 mg total) by mouth every 8 (eight) hours as needed for mild pain.       amiodarone (PACERONE) 200 MG tablet Take 1 tablet (200 mg total) by mouth daily. 90 tablet 3   atorvastatin (LIPITOR) 40 MG tablet Take 1 tablet (40 mg total) by mouth daily. 90 tablet 3   B Complex-C-Folic Acid (DIALYVITE TABLET) TABS Take 1 tablet by mouth daily.       bisacodyl (DULCOLAX) 5 MG EC tablet Take 1 tablet (5 mg total) by mouth daily as needed for moderate constipation.       colchicine 0.6 MG tablet TAKE 0.5 TABLETS (0.3 MG TOTAL) BY MOUTH EVERY MONDAY, WEDNESDAY, AND FRIDAY. 45 tablet 3   ELIQUIS 5 MG TABS tablet Take 1 tablet (5 mg total) by mouth 2 (two) times daily. 180 tablet 3   ethyl chloride spray Apply 1 application  topically every Monday, Wednesday, and Friday with hemodialysis.       fluticasone (FLONASE)  50 MCG/ACT nasal spray Place 2 sprays into both nostrils daily. (Patient taking differently: Place 2 sprays into both nostrils daily as needed for allergies.) 9.9 mL 0   gabapentin (NEURONTIN) 100 MG capsule Take 100 mg by mouth at bedtime.   3   loratadine (CLARITIN) 10 MG tablet Take 1 tablet (10 mg total) by mouth daily. (Patient taking differently: Take 10 mg by mouth daily as needed for allergies.) 30 tablet 0   methocarbamol (ROBAXIN) 500 MG tablet Take 1 tablet (500 mg total) by mouth every 8 (eight) hours as needed for muscle spasms. (Patient not taking: Reported on 05/01/2023) 30 tablet 0   midodrine (PROAMATINE) 10 MG tablet Take 1 tablet (10 mg total) by mouth 3 (three) times daily with meals.       oxyCODONE (ROXICODONE) 5 MG immediate release tablet Take 1 tablet (5 mg total) by mouth every 6 (six) hours as needed for severe pain. 10 tablet 0   sucroferric oxyhydroxide (VELPHORO) 500 MG chewable tablet Chew 2 tablets (1,000 mg total) by mouth 3 (three) times daily with meals. 180 tablet 0      No current facility-administered medications for this visit.        REVIEW OF SYSTEMS:  [  X] denotes positive finding, [ ]  denotes negative finding Cardiac   Comments:  Chest pain or chest pressure:      Shortness of breath upon exertion:      Short of breath when lying flat:      Irregular heart rhythm:             Vascular      Pain in calf, thigh, or hip brought on by ambulation:      Pain in feet at night that wakes you up from your sleep:       Blood clot in your veins:      Leg swelling:              Pulmonary      Oxygen at home:      Productive cough:       Wheezing:              Neurologic      Sudden weakness in arms or legs:       Sudden numbness in arms or legs:       Sudden onset of difficulty speaking or slurred speech:      Temporary loss of vision in one eye:       Problems with dizziness:              Gastrointestinal      Blood in stool:       Vomited blood:               Genitourinary      Burning when urinating:       Blood in urine:             Psychiatric      Major depression:              Hematologic      Bleeding problems:      Problems with blood clotting too easily:             Skin      Rashes or ulcers:             Constitutional      Fever or chills:          PHYSICAL EXAM: There were no vitals filed for this visit.   GENERAL: The patient is a well-nourished female, in no acute distress. The vital signs are documented above. CARDIAC: There is a regular rate and rhythm.  VASCULAR:  Left radiocephalic AV fistula with excellent thrill Ulcerated segment with aneurysm as pictured below PULMONARY: No respiratory distress. ABDOMEN: Soft and non-tender. MUSCULOSKELETAL: There are no major deformities or cyanosis. NEUROLOGIC: No focal weakness or paresthesias are detected. PSYCHIATRIC: The patient has a normal affect.      DATA:        Assessment/Plan:   67 y.o. female, with end-stage renal disease on hemodialysis Monday Wednesday Friday that presents for evaluation of an ulceration and aneurysm over her left radiocephalic AV fistula.  This was placed in 2013 by Dr. Darrick Penna.  I have recommended left arm AV fistula revision at [facility] given risk of bleeding from the ulceration.  I offered to get this scheduled tomorrow but she is currently on Eliquis.  Will get scheduled for Monday.  I am hopeful we can avoid a catheter at her request and she should have enough room to stick away from the revised site.  This will need one months to heal as discussed with her.  Risk benefits  discussed.     Cephus Shelling, MD [facility] [place] Office: [phone]

## 2023-08-11 NOTE — Progress Notes (Signed)
Fentanyl iv wasted in steri cycle. Witnessed by Lesly Rubenstein RN

## 2023-08-12 ENCOUNTER — Encounter (HOSPITAL_COMMUNITY): Payer: Self-pay | Admitting: Vascular Surgery

## 2023-08-12 NOTE — Addendum Note (Signed)
Addendum  created 08/12/23 1608 by Bethena Midget, MD   Intraprocedure Staff edited

## 2023-08-12 NOTE — Addendum Note (Signed)
Addendum  created 08/12/23 2019 by [place] Nation, MD   Intraprocedure Staff edited

## 2023-08-12 NOTE — Anesthesia Postprocedure Evaluation (Signed)
Anesthesia Post Note  Patient: Brenda Arnold  Procedure(s) Performed: PLICATION OF LEFT ARM ARTERIOVENOUS FISTULA (Left: Arm Lower)     Anesthesia Type: General Anesthetic complications: no   No notable events documented.  Last Vitals:  Vitals:   08/11/23 1730 08/11/23 1740  BP: (!) 105/55 (!) 105/56  Pulse: 79 75  Resp: (!) 26 15  Temp:    SpO2: 100% 98%    Last Pain:  Vitals:   08/11/23 1745  PainSc: 3                  Rhena Glace

## 2023-08-13 DIAGNOSIS — D631 Anemia in chronic kidney disease: Secondary | ICD-10-CM | POA: Diagnosis not present

## 2023-08-13 DIAGNOSIS — N2581 Secondary hyperparathyroidism of renal origin: Secondary | ICD-10-CM | POA: Diagnosis not present

## 2023-08-13 DIAGNOSIS — N186 End stage renal disease: Secondary | ICD-10-CM | POA: Diagnosis not present

## 2023-08-13 DIAGNOSIS — Z992 Dependence on renal dialysis: Secondary | ICD-10-CM | POA: Diagnosis not present

## 2023-08-14 NOTE — Addendum Note (Signed)
Addendum  created 08/14/23 0726 by [place] Nation, MD   Intraprocedure Staff edited

## 2023-08-15 DIAGNOSIS — N186 End stage renal disease: Secondary | ICD-10-CM | POA: Diagnosis not present

## 2023-08-15 DIAGNOSIS — Z992 Dependence on renal dialysis: Secondary | ICD-10-CM | POA: Diagnosis not present

## 2023-08-15 DIAGNOSIS — D631 Anemia in chronic kidney disease: Secondary | ICD-10-CM | POA: Diagnosis not present

## 2023-08-15 DIAGNOSIS — N2581 Secondary hyperparathyroidism of renal origin: Secondary | ICD-10-CM | POA: Diagnosis not present

## 2023-08-18 DIAGNOSIS — Z992 Dependence on renal dialysis: Secondary | ICD-10-CM | POA: Diagnosis not present

## 2023-08-18 DIAGNOSIS — D631 Anemia in chronic kidney disease: Secondary | ICD-10-CM | POA: Diagnosis not present

## 2023-08-18 DIAGNOSIS — N186 End stage renal disease: Secondary | ICD-10-CM | POA: Diagnosis not present

## 2023-08-18 DIAGNOSIS — N2581 Secondary hyperparathyroidism of renal origin: Secondary | ICD-10-CM | POA: Diagnosis not present

## 2023-08-20 ENCOUNTER — Telehealth: Payer: Self-pay

## 2023-08-20 ENCOUNTER — Ambulatory Visit (INDEPENDENT_AMBULATORY_CARE_PROVIDER_SITE_OTHER): Payer: Medicare Other | Admitting: Physician Assistant

## 2023-08-20 VITALS — BP 130/81 | HR 80 | Temp 98.0°F | Resp 18 | Ht 67.0 in | Wt 234.8 lb

## 2023-08-20 DIAGNOSIS — D631 Anemia in chronic kidney disease: Secondary | ICD-10-CM | POA: Diagnosis not present

## 2023-08-20 DIAGNOSIS — Z992 Dependence on renal dialysis: Secondary | ICD-10-CM | POA: Diagnosis not present

## 2023-08-20 DIAGNOSIS — N186 End stage renal disease: Secondary | ICD-10-CM | POA: Diagnosis not present

## 2023-08-20 DIAGNOSIS — N2581 Secondary hyperparathyroidism of renal origin: Secondary | ICD-10-CM | POA: Diagnosis not present

## 2023-08-20 NOTE — Telephone Encounter (Signed)
Caller: Aram Beecham @ [facility])  Concern: painful open area at AVF with bloody drainage  Location: left arm  Treatments:  covered with bandage & tape, Dr. Verna Czech at HD clinic evaluated, but did not remove bandage  Procedure:  Plication of L AVF on 08/11/23  Consulted: Sam, PA  Resolution: Appointment scheduled for today since pt is next door and will be done with treatment in a few min  Next Appt: Appointment scheduled for 1000 with PA

## 2023-08-20 NOTE — Progress Notes (Signed)
POST OPERATIVE OFFICE NOTE    CC:  F/u for surgery  HPI:  Brenda Arnold is a 67 y.o. female who is here for a triage visit.  She recently underwent left forearm fistula plication and revision on 08/11/2023 by Dr. Hetty Blend.  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  She returns today as a triage visit.  She states over the weekend a small area of her incision opened up.  This area has had intermittent bloody drainage.  She denies any puslike drainage, erythema, or fevers.  She is also had a little bit of left arm swelling since surgery.  Her fistula can still be accessed distally at the wrist.  She denies any pain, numbness, or weakness in the left hand.   Allergies  Allergen Reactions   Iodine Shortness Of Breath   Shellfish Allergy Shortness Of Breath    Breathing Problems   Amlodipine Swelling   Felodipine Swelling    Headache   Lisinopril Cough    Current Outpatient Medications  Medication Sig Dispense Refill   acetaminophen (TYLENOL) 500 MG tablet Take 2 tablets (1,000 mg total) by mouth every 8 (eight) hours as needed for mild pain. (Patient taking differently: Take 500 mg by mouth every 8 (eight) hours as needed for mild pain.)     amiodarone (PACERONE) 200 MG tablet Take 1 tablet (200 mg total) by mouth daily. 90 tablet 3   anastrozole (ARIMIDEX) 1 MG tablet Take 1 tablet (1 mg total) by mouth daily. 90 tablet 3   atorvastatin (LIPITOR) 40 MG tablet Take 1 tablet (40 mg total) by mouth daily. 90 tablet 3   B Complex-C-Folic Acid (DIALYVITE TABLET) TABS Take 1 tablet by mouth daily.     bisacodyl (DULCOLAX) 5 MG EC tablet Take 1 tablet (5 mg total) by mouth daily as needed for moderate constipation.     ciprofloxacin-dexamethasone (CIPRODEX) OTIC suspension Place 4 drops into both ears 2 (two) times daily as needed (ear wax build up).     colchicine 0.6 MG tablet TAKE 0.5 TABLETS (0.3 MG TOTAL) BY MOUTH EVERY MONDAY, WEDNESDAY, AND FRIDAY. 45  tablet 3   ELIQUIS 5 MG TABS tablet Take 1 tablet (5 mg total) by mouth 2 (two) times daily. 180 tablet 3   ethyl chloride spray Apply 1 application  topically every Monday, Wednesday, and Friday with hemodialysis.     fluticasone (FLONASE) 50 MCG/ACT nasal spray Place 2 sprays into both nostrils daily. (Patient taking differently: Place 2 sprays into both nostrils daily as needed for allergies.) 9.9 mL 0   gabapentin (NEURONTIN) 100 MG capsule Take 100 mg by mouth at bedtime.  3   loratadine (CLARITIN) 10 MG tablet Take 1 tablet (10 mg total) by mouth daily. (Patient taking differently: Take 10 mg by mouth daily as needed for allergies.) 30 tablet 0   midodrine (PROAMATINE) 10 MG tablet Take 1 tablet (10 mg total) by mouth 3 (three) times daily with meals.     oxyCODONE-acetaminophen (PERCOCET) 5-325 MG tablet Take 1 tablet by mouth every 6 (six) hours as needed for severe pain. 20 tablet 0   sucroferric oxyhydroxide (VELPHORO) 500 MG chewable tablet Chew 2 tablets (1,000 mg total) by mouth 3 (three) times daily with meals. (Patient taking differently: Chew 500-1,000 mg by mouth See admin instructions. Take 1000 mg with meals and 500 mg with snacks) 180 tablet 0   No current facility-administered medications for this visit.     ROS:  See HPI  Physical Exam:  Incision: Left forearm incision healing appropriately with 1 small area of dehiscence.  This area has a superficial scab in place and is currently dry Extremities: Left forearm fistula with great thrill on palpation.  Palpable left radial pulse Neuro: Intact motor and sensation of left upper extremity      Assessment/Plan:  This is a 67 y.o. female who is here as a postop visit  -She recently underwent left forearm fistula plication revision on 08/11/2023 -Over the weekend the patient noted a small area of her incision opened up and had intermittent bloody drainage.  She denies any puslike drainage, erythema, or tenderness. -On exam  there is a small area of dehiscence of the incision.  This area is superficial and seems to be scabbing over.  There is no bloody drainage at this time.  There are no signs of infection -The fistula still working well at dialysis distally.  There is a great thrill on palpation -I have discussed with the patient she should continue to gently clean and pat dry this incision daily.  She can wear a dry bandage over the incision as needed to help with drainage.  I encouraged her to call our office if this area opens up more -She can keep her next incision check with our office on 08/26/2023   Loel Dubonnet, PA-C [facility] [phone]   Clinic MD:  Randie Heinz

## 2023-08-22 DIAGNOSIS — Z992 Dependence on renal dialysis: Secondary | ICD-10-CM | POA: Diagnosis not present

## 2023-08-22 DIAGNOSIS — D631 Anemia in chronic kidney disease: Secondary | ICD-10-CM | POA: Diagnosis not present

## 2023-08-22 DIAGNOSIS — N2581 Secondary hyperparathyroidism of renal origin: Secondary | ICD-10-CM | POA: Diagnosis not present

## 2023-08-22 DIAGNOSIS — N186 End stage renal disease: Secondary | ICD-10-CM | POA: Diagnosis not present

## 2023-08-25 DIAGNOSIS — D631 Anemia in chronic kidney disease: Secondary | ICD-10-CM | POA: Diagnosis not present

## 2023-08-25 DIAGNOSIS — N2581 Secondary hyperparathyroidism of renal origin: Secondary | ICD-10-CM | POA: Diagnosis not present

## 2023-08-25 DIAGNOSIS — N186 End stage renal disease: Secondary | ICD-10-CM | POA: Diagnosis not present

## 2023-08-25 DIAGNOSIS — Z992 Dependence on renal dialysis: Secondary | ICD-10-CM | POA: Diagnosis not present

## 2023-08-26 ENCOUNTER — Ambulatory Visit (INDEPENDENT_AMBULATORY_CARE_PROVIDER_SITE_OTHER): Payer: Medicare Other | Admitting: Physician Assistant

## 2023-08-26 VITALS — BP 88/49 | HR 71 | Temp 97.2°F | Resp 18 | Ht 67.0 in | Wt 235.5 lb

## 2023-08-26 DIAGNOSIS — N186 End stage renal disease: Secondary | ICD-10-CM

## 2023-08-26 NOTE — Progress Notes (Signed)
POST OPERATIVE OFFICE NOTE    CC:  F/u for surgery  HPI:  Brenda Arnold is a 67 y.o. female who is s/p left forearm fistula plication and revision on 08/11/2023 by Dr. Hetty Blend.  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  She presented to our office as a triage visit last week with a small area of incision dehiscence.  This area began to scab over and had no signs of infection.  She was encouraged to keep this area clean and dry and follow-up with our office in a week.  Pt returns today for follow up.  She has been doing well since her last office visit.  Her incision is now nearly healed.  There is no more issues with dehiscence or drainage.  Her fistula is still being accessed distally at the wrist.   Allergies  Allergen Reactions   Iodine Shortness Of Breath   Shellfish Allergy Shortness Of Breath    Breathing Problems   Amlodipine Swelling   Felodipine Swelling    Headache   Lisinopril Cough    Current Outpatient Medications  Medication Sig Dispense Refill   acetaminophen (TYLENOL) 500 MG tablet Take 2 tablets (1,000 mg total) by mouth every 8 (eight) hours as needed for mild pain. (Patient taking differently: Take 500 mg by mouth every 8 (eight) hours as needed for mild pain.)     amiodarone (PACERONE) 200 MG tablet Take 1 tablet (200 mg total) by mouth daily. 90 tablet 3   anastrozole (ARIMIDEX) 1 MG tablet Take 1 tablet (1 mg total) by mouth daily. 90 tablet 3   atorvastatin (LIPITOR) 40 MG tablet Take 1 tablet (40 mg total) by mouth daily. 90 tablet 3   B Complex-C-Folic Acid (DIALYVITE TABLET) TABS Take 1 tablet by mouth daily.     bisacodyl (DULCOLAX) 5 MG EC tablet Take 1 tablet (5 mg total) by mouth daily as needed for moderate constipation.     ciprofloxacin-dexamethasone (CIPRODEX) OTIC suspension Place 4 drops into both ears 2 (two) times daily as needed (ear wax build up). (Patient not taking: Reported on 08/26/2023)     colchicine 0.6 MG  tablet TAKE 0.5 TABLETS (0.3 MG TOTAL) BY MOUTH EVERY MONDAY, WEDNESDAY, AND FRIDAY. 45 tablet 3   ELIQUIS 5 MG TABS tablet Take 1 tablet (5 mg total) by mouth 2 (two) times daily. 180 tablet 3   ethyl chloride spray Apply 1 application  topically every Monday, Wednesday, and Friday with hemodialysis.     fluticasone (FLONASE) 50 MCG/ACT nasal spray Place 2 sprays into both nostrils daily. (Patient taking differently: Place 2 sprays into both nostrils daily as needed for allergies.) 9.9 mL 0   gabapentin (NEURONTIN) 100 MG capsule Take 100 mg by mouth at bedtime.  3   loratadine (CLARITIN) 10 MG tablet Take 1 tablet (10 mg total) by mouth daily. (Patient taking differently: Take 10 mg by mouth daily as needed for allergies.) 30 tablet 0   midodrine (PROAMATINE) 10 MG tablet Take 1 tablet (10 mg total) by mouth 3 (three) times daily with meals.     oxyCODONE-acetaminophen (PERCOCET) 5-325 MG tablet Take 1 tablet by mouth every 6 (six) hours as needed for severe pain. (Patient not taking: Reported on 08/26/2023) 20 tablet 0   sucroferric oxyhydroxide (VELPHORO) 500 MG chewable tablet Chew 2 tablets (1,000 mg total) by mouth 3 (three) times daily with meals. (Patient taking differently: Chew 500-1,000 mg by mouth See admin instructions. Take 1000 mg  with meals and 500 mg with snacks) 180 tablet 0   No current facility-administered medications for this visit.     ROS:  See HPI  Physical Exam:   Incision: Left forearm incision nearly healed with no areas of dehiscence.  No erythema or drainage Extremities: Left forearm fistula with good thrill on palpation.  Palpable left radial pulse Neuro: intact motor and sensation of left upper extremity    Assessment/Plan:  This is a 67 y.o. female who is here for a post op visit  -She is doing much better this week after her revision and plication on 08/11/2023.  Her left arm incision is now nearly healed without any dehiscence, drainage, erythema -She has  no new fistula ulcerations.  On exam her left forearm fistula has a great thrill and palpation.  She has a 2+ left radial pulse -At this time her dialysis center should continue to use the distal portion of her fistula for access.  She should be fully healed in 3 more weeks.  I believe by October 15 the entirety of her fistula can be accessed. -She can follow-up with our office as needed   Loel Dubonnet, PA-C [facility] [phone]   Clinic MD:  Steve Rattler

## 2023-08-27 DIAGNOSIS — D631 Anemia in chronic kidney disease: Secondary | ICD-10-CM | POA: Diagnosis not present

## 2023-08-27 DIAGNOSIS — Z992 Dependence on renal dialysis: Secondary | ICD-10-CM | POA: Diagnosis not present

## 2023-08-27 DIAGNOSIS — N2581 Secondary hyperparathyroidism of renal origin: Secondary | ICD-10-CM | POA: Diagnosis not present

## 2023-08-27 DIAGNOSIS — N186 End stage renal disease: Secondary | ICD-10-CM | POA: Diagnosis not present

## 2023-08-29 DIAGNOSIS — D631 Anemia in chronic kidney disease: Secondary | ICD-10-CM | POA: Diagnosis not present

## 2023-08-29 DIAGNOSIS — N2581 Secondary hyperparathyroidism of renal origin: Secondary | ICD-10-CM | POA: Diagnosis not present

## 2023-08-29 DIAGNOSIS — N186 End stage renal disease: Secondary | ICD-10-CM | POA: Diagnosis not present

## 2023-08-29 DIAGNOSIS — Z992 Dependence on renal dialysis: Secondary | ICD-10-CM | POA: Diagnosis not present

## 2023-09-01 DIAGNOSIS — D631 Anemia in chronic kidney disease: Secondary | ICD-10-CM | POA: Diagnosis not present

## 2023-09-01 DIAGNOSIS — N2581 Secondary hyperparathyroidism of renal origin: Secondary | ICD-10-CM | POA: Diagnosis not present

## 2023-09-01 DIAGNOSIS — Z992 Dependence on renal dialysis: Secondary | ICD-10-CM | POA: Diagnosis not present

## 2023-09-01 DIAGNOSIS — N186 End stage renal disease: Secondary | ICD-10-CM | POA: Diagnosis not present

## 2023-09-02 DIAGNOSIS — N186 End stage renal disease: Secondary | ICD-10-CM | POA: Diagnosis not present

## 2023-09-02 DIAGNOSIS — Z992 Dependence on renal dialysis: Secondary | ICD-10-CM | POA: Diagnosis not present

## 2023-09-02 DIAGNOSIS — E1122 Type 2 diabetes mellitus with diabetic chronic kidney disease: Secondary | ICD-10-CM | POA: Diagnosis not present

## 2023-09-03 DIAGNOSIS — Z992 Dependence on renal dialysis: Secondary | ICD-10-CM | POA: Diagnosis not present

## 2023-09-03 DIAGNOSIS — N2581 Secondary hyperparathyroidism of renal origin: Secondary | ICD-10-CM | POA: Diagnosis not present

## 2023-09-03 DIAGNOSIS — D631 Anemia in chronic kidney disease: Secondary | ICD-10-CM | POA: Diagnosis not present

## 2023-09-03 DIAGNOSIS — N186 End stage renal disease: Secondary | ICD-10-CM | POA: Diagnosis not present

## 2023-09-03 DIAGNOSIS — Z23 Encounter for immunization: Secondary | ICD-10-CM | POA: Diagnosis not present

## 2023-09-05 DIAGNOSIS — Z23 Encounter for immunization: Secondary | ICD-10-CM | POA: Diagnosis not present

## 2023-09-05 DIAGNOSIS — D631 Anemia in chronic kidney disease: Secondary | ICD-10-CM | POA: Diagnosis not present

## 2023-09-05 DIAGNOSIS — Z992 Dependence on renal dialysis: Secondary | ICD-10-CM | POA: Diagnosis not present

## 2023-09-05 DIAGNOSIS — N2581 Secondary hyperparathyroidism of renal origin: Secondary | ICD-10-CM | POA: Diagnosis not present

## 2023-09-05 DIAGNOSIS — N186 End stage renal disease: Secondary | ICD-10-CM | POA: Diagnosis not present

## 2023-09-08 DIAGNOSIS — Z992 Dependence on renal dialysis: Secondary | ICD-10-CM | POA: Diagnosis not present

## 2023-09-08 DIAGNOSIS — N2581 Secondary hyperparathyroidism of renal origin: Secondary | ICD-10-CM | POA: Diagnosis not present

## 2023-09-08 DIAGNOSIS — N186 End stage renal disease: Secondary | ICD-10-CM | POA: Diagnosis not present

## 2023-09-08 DIAGNOSIS — Z23 Encounter for immunization: Secondary | ICD-10-CM | POA: Diagnosis not present

## 2023-09-08 DIAGNOSIS — D631 Anemia in chronic kidney disease: Secondary | ICD-10-CM | POA: Diagnosis not present

## 2023-09-10 DIAGNOSIS — N2581 Secondary hyperparathyroidism of renal origin: Secondary | ICD-10-CM | POA: Diagnosis not present

## 2023-09-10 DIAGNOSIS — D631 Anemia in chronic kidney disease: Secondary | ICD-10-CM | POA: Diagnosis not present

## 2023-09-10 DIAGNOSIS — Z992 Dependence on renal dialysis: Secondary | ICD-10-CM | POA: Diagnosis not present

## 2023-09-10 DIAGNOSIS — E1122 Type 2 diabetes mellitus with diabetic chronic kidney disease: Secondary | ICD-10-CM | POA: Diagnosis not present

## 2023-09-10 DIAGNOSIS — N186 End stage renal disease: Secondary | ICD-10-CM | POA: Diagnosis not present

## 2023-09-10 DIAGNOSIS — Z23 Encounter for immunization: Secondary | ICD-10-CM | POA: Diagnosis not present

## 2023-09-12 DIAGNOSIS — Z23 Encounter for immunization: Secondary | ICD-10-CM | POA: Diagnosis not present

## 2023-09-12 DIAGNOSIS — Z992 Dependence on renal dialysis: Secondary | ICD-10-CM | POA: Diagnosis not present

## 2023-09-12 DIAGNOSIS — N2581 Secondary hyperparathyroidism of renal origin: Secondary | ICD-10-CM | POA: Diagnosis not present

## 2023-09-12 DIAGNOSIS — D631 Anemia in chronic kidney disease: Secondary | ICD-10-CM | POA: Diagnosis not present

## 2023-09-12 DIAGNOSIS — N186 End stage renal disease: Secondary | ICD-10-CM | POA: Diagnosis not present

## 2023-09-15 DIAGNOSIS — N186 End stage renal disease: Secondary | ICD-10-CM | POA: Diagnosis not present

## 2023-09-15 DIAGNOSIS — Z992 Dependence on renal dialysis: Secondary | ICD-10-CM | POA: Diagnosis not present

## 2023-09-15 DIAGNOSIS — N2581 Secondary hyperparathyroidism of renal origin: Secondary | ICD-10-CM | POA: Diagnosis not present

## 2023-09-15 DIAGNOSIS — Z23 Encounter for immunization: Secondary | ICD-10-CM | POA: Diagnosis not present

## 2023-09-15 DIAGNOSIS — D631 Anemia in chronic kidney disease: Secondary | ICD-10-CM | POA: Diagnosis not present

## 2023-09-17 ENCOUNTER — Ambulatory Visit (INDEPENDENT_AMBULATORY_CARE_PROVIDER_SITE_OTHER): Payer: Medicare Other | Admitting: Physician Assistant

## 2023-09-17 ENCOUNTER — Telehealth: Payer: Self-pay

## 2023-09-17 ENCOUNTER — Other Ambulatory Visit: Payer: Self-pay

## 2023-09-17 ENCOUNTER — Encounter: Payer: Self-pay | Admitting: Physician Assistant

## 2023-09-17 DIAGNOSIS — Z23 Encounter for immunization: Secondary | ICD-10-CM | POA: Diagnosis not present

## 2023-09-17 DIAGNOSIS — N186 End stage renal disease: Secondary | ICD-10-CM

## 2023-09-17 DIAGNOSIS — Z992 Dependence on renal dialysis: Secondary | ICD-10-CM | POA: Diagnosis not present

## 2023-09-17 DIAGNOSIS — D631 Anemia in chronic kidney disease: Secondary | ICD-10-CM | POA: Diagnosis not present

## 2023-09-17 DIAGNOSIS — N2581 Secondary hyperparathyroidism of renal origin: Secondary | ICD-10-CM | POA: Diagnosis not present

## 2023-09-17 NOTE — Progress Notes (Signed)
POST OPERATIVE OFFICE NOTE    CC:  F/u for surgery  HPI:  This is a 67 y.o. female who is s/p left radial cephalic fistula plication on 12/07/1094 by Dr. Hetty Blend   Dialysis access hx: -left RC AVF 09/02/2012 Dr. Darrick Penna -revision left RC AVF with branch ligation 04/27/2014 Dr. Hart [place] -plication left RC AVF for aneurysm and ulcer 01/13/2020 Dr. Chestine Spore -plication left RC AVF for ulcer over aneurysm 08/11/2023 Dr. Hetty Blend  Pt states she does not have pain/numbness in the left hand.    She states that Dr. Marisue Humble was worried about an ulceration on her fistula and called for her to be seen today. She states that the area of concern has never bled.  They are sticking on the side of the fistula currently.    The pt is on dialysis M/W/F at [facility] location.   Allergies  Allergen Reactions   Iodine Shortness Of Breath   Shellfish Allergy Shortness Of Breath    Breathing Problems   Amlodipine Swelling   Felodipine Swelling    Headache   Lisinopril Cough    Current Outpatient Medications  Medication Sig Dispense Refill   acetaminophen (TYLENOL) 500 MG tablet Take 2 tablets (1,000 mg total) by mouth every 8 (eight) hours as needed for mild pain. (Patient taking differently: Take 500 mg by mouth every 8 (eight) hours as needed for mild pain.)     amiodarone (PACERONE) 200 MG tablet Take 1 tablet (200 mg total) by mouth daily. 90 tablet 3   anastrozole (ARIMIDEX) 1 MG tablet Take 1 tablet (1 mg total) by mouth daily. 90 tablet 3   atorvastatin (LIPITOR) 40 MG tablet Take 1 tablet (40 mg total) by mouth daily. 90 tablet 3   B Complex-C-Folic Acid (DIALYVITE TABLET) TABS Take 1 tablet by mouth daily.     bisacodyl (DULCOLAX) 5 MG EC tablet Take 1 tablet (5 mg total) by mouth daily as needed for moderate constipation.     ciprofloxacin-dexamethasone (CIPRODEX) OTIC suspension Place 4 drops into both ears 2 (two) times daily as needed (ear wax build up). (Patient not taking: Reported on 08/26/2023)      colchicine 0.6 MG tablet TAKE 0.5 TABLETS (0.3 MG TOTAL) BY MOUTH EVERY MONDAY, WEDNESDAY, AND FRIDAY. 45 tablet 3   ELIQUIS 5 MG TABS tablet Take 1 tablet (5 mg total) by mouth 2 (two) times daily. 180 tablet 3   ethyl chloride spray Apply 1 application  topically every Monday, Wednesday, and Friday with hemodialysis.     fluticasone (FLONASE) 50 MCG/ACT nasal spray Place 2 sprays into both nostrils daily. (Patient taking differently: Place 2 sprays into both nostrils daily as needed for allergies.) 9.9 mL 0   gabapentin (NEURONTIN) 100 MG capsule Take 100 mg by mouth at bedtime.  3   loratadine (CLARITIN) 10 MG tablet Take 1 tablet (10 mg total) by mouth daily. (Patient taking differently: Take 10 mg by mouth daily as needed for allergies.) 30 tablet 0   midodrine (PROAMATINE) 10 MG tablet Take 1 tablet (10 mg total) by mouth 3 (three) times daily with meals.     oxyCODONE-acetaminophen (PERCOCET) 5-325 MG tablet Take 1 tablet by mouth every 6 (six) hours as needed for severe pain. (Patient not taking: Reported on 08/26/2023) 20 tablet 0   sucroferric oxyhydroxide (VELPHORO) 500 MG chewable tablet Chew 2 tablets (1,000 mg total) by mouth 3 (three) times daily with meals. (Patient taking differently: Chew 500-1,000 mg by mouth See admin instructions. Take  1000 mg with meals and 500 mg with snacks) 180 tablet 0   No current facility-administered medications for this visit.     ROS:  See HPI  Physical Exam:   Incision:  healed nicely.  Extremities:   There is a palpable left radial pulse.   Motor and sensory are in tact.   There is a thrill/bruit present.  Access is  easily palpable There is an ulceration distal to most recent plication.   Unable to pinch skin over this area.      Assessment/Plan:  This is a 67 y.o. female who is s/p: left radial cephalic fistula plication on 03/06/4097 by Dr. Hetty Blend   -the pt does not have evidence of steal. -she does have area of concern with  ulceration over the fistula.  Unable to pinch the skin over this area. Discussed with Dr. Randie Heinz and will plan for plication of this area next Tuesday with Dr. Randie Heinz since this is a non dialysis day and she is on Eliquis.  Discussed with her should she have any bleeding from this area, where to hold pressure and call 911.  She expressed understanding.  I did discuss there is a chance for [facility], which is really does not want to have.  Hopefully, they can stick recently repaired area as it will be 6 weeks at that time.    Doreatha Massed, [facility] [phone]  Clinic MD:  Randie Heinz

## 2023-09-17 NOTE — Telephone Encounter (Signed)
Caller: Aram Beecham at [facility], patient  Concern: Ulcer at distal arterial site per Dr. Marisue Humble at HD center  Location: left arm  Procedure:  Plication of L AVF on 08/11/23  Consulted: B. Randie Heinz, MD, Gwynneth Munson, PA  Resolution: Appointment scheduled for today  Next Appt: Appointment scheduled for 10/16 @ 1345 with PA

## 2023-09-19 DIAGNOSIS — D631 Anemia in chronic kidney disease: Secondary | ICD-10-CM | POA: Diagnosis not present

## 2023-09-19 DIAGNOSIS — N186 End stage renal disease: Secondary | ICD-10-CM | POA: Diagnosis not present

## 2023-09-19 DIAGNOSIS — Z992 Dependence on renal dialysis: Secondary | ICD-10-CM | POA: Diagnosis not present

## 2023-09-19 DIAGNOSIS — Z23 Encounter for immunization: Secondary | ICD-10-CM | POA: Diagnosis not present

## 2023-09-19 DIAGNOSIS — N2581 Secondary hyperparathyroidism of renal origin: Secondary | ICD-10-CM | POA: Diagnosis not present

## 2023-09-22 ENCOUNTER — Ambulatory Visit
Admission: RE | Admit: 2023-09-22 | Discharge: 2023-09-22 | Disposition: A | Discharge status: Home / Self Care | Payer: Medicare Other | Source: Ambulatory Visit | Attending: Hematology and Oncology | Admitting: Hematology and Oncology

## 2023-09-22 ENCOUNTER — Other Ambulatory Visit: Payer: Self-pay

## 2023-09-22 ENCOUNTER — Encounter (HOSPITAL_COMMUNITY): Payer: Self-pay | Admitting: Vascular Surgery

## 2023-09-22 DIAGNOSIS — D631 Anemia in chronic kidney disease: Secondary | ICD-10-CM | POA: Diagnosis not present

## 2023-09-22 DIAGNOSIS — N186 End stage renal disease: Secondary | ICD-10-CM | POA: Diagnosis not present

## 2023-09-22 DIAGNOSIS — N2581 Secondary hyperparathyroidism of renal origin: Secondary | ICD-10-CM | POA: Diagnosis not present

## 2023-09-22 DIAGNOSIS — Z23 Encounter for immunization: Secondary | ICD-10-CM | POA: Diagnosis not present

## 2023-09-22 DIAGNOSIS — Z992 Dependence on renal dialysis: Secondary | ICD-10-CM | POA: Diagnosis not present

## 2023-09-22 NOTE — Progress Notes (Signed)
Radiation Oncology         [id number]) [id number] ________________________________  Name: Brenda Arnold MRN: [id number]  Date of Service: 09/22/2023  DOB: 1956/09/06  Post Treatment Telephone Note  Diagnosis:  Stage IA, pT1c, cN0M0, grade 1, ER positive invasive ductal carcinoma of the right breast (as documented in provider EOT note)  The patient was not available for call today.    The patient was encouraged to avoid sun exposure in the area of prior treatment for up to one year following radiation with either sunscreen or by the style of clothing worn in the sun.  The patient has scheduled follow up with her medical oncologist Dr. Al Pimple for ongoing surveillance, and was encouraged to call if she develops concerns or questions regarding radiation.   Ruel Favors, LPN

## 2023-09-22 NOTE — Pre-Procedure Instructions (Signed)
PCP - [name], MD Cardiologist - Traci R. Turner, MD  EKG - 09/27/22 Stress Test - 09/20/21 ECHO - 03/16/21  CPAP - Hasn't use one in 4-5 yrs  Fasting Blood Sugar - does not check at home  Blood Thinner Instructions: Eliquis last dose was Sat  Anesthesia review: Y  Patient verbally denies any shortness of breath, fever, cough and chest pain during phone call   -------------  SDW INSTRUCTIONS given:  Your procedure is scheduled on Tuesday, October 22nd.  Report to [facility] Main Entrance "A" at 0740 A.M., and check in at the Admitting office.  Call this number if you have problems the morning of surgery:  [phone]   Remember:  Do not eat or drink after midnight the night before your surgery     Take these medicines the morning of surgery with A SIP OF WATER  amiodarone (PACERONE)  anastrozole (ARIMIDEX)  midodrine (PROAMATINE)  acetaminophen (TYLENOL)-if needed fluticasone (FLONASE)-if needed loratadine (CLARITIN)-if needed  As of today, STOP taking any Aspirin (unless otherwise instructed by your surgeon) Aleve, Naproxen, Ibuprofen, Motrin, Advil, Goody's, BC's, all herbal medications, fish oil, and all vitamins.                      Do not wear jewelry, make up, or nail polish            Do not wear lotions, powders, perfumes/colognes, or deodorant.            Do not shave 48 hours prior to surgery.  Men may shave face and neck.            Do not bring valuables to the hospital.            [facility] is not responsible for any belongings or valuables.  Do NOT Smoke (Tobacco/Vaping) 24 hours prior to your procedure If you use a CPAP at night, you may bring all equipment for your overnight stay.   Contacts, glasses, dentures or bridgework may not be worn into surgery.      For patients admitted to the hospital, discharge time will be determined by your treatment team.   Patients discharged the day of surgery will not be allowed to drive home, and  someone needs to stay with them for 24 hours.    Special instructions:   [place]- Preparing For Surgery  Before surgery, you can play an important role. Because skin is not sterile, your skin needs to be as free of germs as possible. You can reduce the number of germs on your skin by washing with CHG (chlorahexidine gluconate) Soap before surgery.  CHG is an antiseptic cleaner which kills germs and bonds with the skin to continue killing germs even after washing.    Oral Hygiene is also important to reduce your risk of infection.  Remember - BRUSH YOUR TEETH THE MORNING OF SURGERY WITH YOUR REGULAR TOOTHPASTE  Please do not use if you have an allergy to CHG or antibacterial soaps. If your skin becomes reddened/irritated stop using the CHG.  Do not shave (including legs and underarms) for at least 48 hours prior to first CHG shower. It is OK to shave your face.  Please follow these instructions carefully.   Shower the NIGHT BEFORE SURGERY and the MORNING OF SURGERY with DIAL Soap.   Pat yourself dry with a CLEAN TOWEL.  Wear CLEAN PAJAMAS to bed the night before surgery  Place CLEAN SHEETS on your bed  the night of your first shower and DO NOT SLEEP WITH PETS.   Day of Surgery: Please shower morning of surgery  Wear Clean/Comfortable clothing the morning of surgery Do not apply any deodorants/lotions.   Remember to brush your teeth WITH YOUR REGULAR TOOTHPASTE.   Questions were answered. Patient verbalized understanding of instructions.

## 2023-09-23 ENCOUNTER — Observation Stay (HOSPITAL_COMMUNITY)
Admission: RE | Admit: 2023-09-23 | Discharge: 2023-09-24 | Disposition: A | Discharge status: Home / Self Care | Payer: Medicare Other | Attending: Vascular Surgery | Admitting: Vascular Surgery

## 2023-09-23 ENCOUNTER — Other Ambulatory Visit: Payer: Self-pay

## 2023-09-23 ENCOUNTER — Other Ambulatory Visit (HOSPITAL_COMMUNITY): Payer: Self-pay

## 2023-09-23 ENCOUNTER — Ambulatory Visit (HOSPITAL_COMMUNITY): Payer: Medicare Other | Admitting: Physician Assistant

## 2023-09-23 ENCOUNTER — Encounter (HOSPITAL_COMMUNITY)
Admission: RE | Disposition: A | Discharge status: Home / Self Care | Payer: Self-pay | Source: Home / Self Care | Attending: Vascular Surgery

## 2023-09-23 ENCOUNTER — Encounter (HOSPITAL_COMMUNITY): Payer: Self-pay | Admitting: Vascular Surgery

## 2023-09-23 DIAGNOSIS — L98499 Non-pressure chronic ulcer of skin of other sites with unspecified severity: Principal | ICD-10-CM | POA: Insufficient documentation

## 2023-09-23 DIAGNOSIS — E1122 Type 2 diabetes mellitus with diabetic chronic kidney disease: Secondary | ICD-10-CM | POA: Diagnosis not present

## 2023-09-23 DIAGNOSIS — N185 Chronic kidney disease, stage 5: Secondary | ICD-10-CM | POA: Diagnosis not present

## 2023-09-23 DIAGNOSIS — T82898A Other specified complication of vascular prosthetic devices, implants and grafts, initial encounter: Secondary | ICD-10-CM | POA: Diagnosis not present

## 2023-09-23 DIAGNOSIS — M25142 Fistula, left hand: Secondary | ICD-10-CM | POA: Insufficient documentation

## 2023-09-23 DIAGNOSIS — Z992 Dependence on renal dialysis: Secondary | ICD-10-CM | POA: Insufficient documentation

## 2023-09-23 DIAGNOSIS — N186 End stage renal disease: Secondary | ICD-10-CM | POA: Diagnosis not present

## 2023-09-23 DIAGNOSIS — I120 Hypertensive chronic kidney disease with stage 5 chronic kidney disease or end stage renal disease: Secondary | ICD-10-CM | POA: Diagnosis not present

## 2023-09-23 HISTORY — PX: FISTULA SUPERFICIALIZATION: SHX6341

## 2023-09-23 LAB — POCT I-STAT, CHEM 8
BUN: 38 mg/dL — ABNORMAL HIGH (ref 8–23)
Calcium, Ion: 0.98 mmol/L — ABNORMAL LOW (ref 1.15–1.40)
Chloride: 100 mmol/L (ref 98–111)
Creatinine, Ser: 8.4 mg/dL — ABNORMAL HIGH (ref 0.44–1.00)
Glucose, Bld: 77 mg/dL (ref 70–99)
HCT: 39 % (ref 36.0–46.0)
Hemoglobin: 13.3 g/dL (ref 12.0–15.0)
Potassium: 4.2 mmol/L (ref 3.5–5.1)
Sodium: 141 mmol/L (ref 135–145)
TCO2: 31 mmol/L (ref 22–32)

## 2023-09-23 LAB — GLUCOSE, CAPILLARY
Glucose-Capillary: 61 mg/dL — ABNORMAL LOW (ref 70–99)
Glucose-Capillary: 126 mg/dL — ABNORMAL HIGH (ref 70–99)
Glucose-Capillary: 99 mg/dL (ref 70–99)

## 2023-09-23 SURGERY — FISTULA SUPERFICIALIZATION
Anesthesia: General | Laterality: Left

## 2023-09-23 MED ORDER — HYDRALAZINE HCL 20 MG/ML IJ SOLN: 5.0000 mg | INTRAMUSCULAR | Status: DC | PRN

## 2023-09-23 MED ORDER — PROPOFOL 10 MG/ML IV BOLUS
INTRAVENOUS | Status: DC | PRN
  Administered 2023-09-23: 100 mg via INTRAVENOUS

## 2023-09-23 MED ORDER — HEPARIN SODIUM (PORCINE) 1000 UNIT/ML IJ SOLN
INTRAMUSCULAR | Status: DC | PRN
  Administered 2023-09-23: 5000 [IU] via INTRAVENOUS

## 2023-09-23 MED ORDER — DEXTROSE 50 % IV SOLN
INTRAVENOUS | Status: AC
  Administered 2023-09-23: 12.5 g via INTRAVENOUS
  Filled 2023-09-23: qty 50

## 2023-09-23 MED ORDER — CHLORHEXIDINE GLUCONATE 0.12 % MT SOLN
OROMUCOSAL | Status: AC
  Administered 2023-09-23: 15 mL via OROMUCOSAL
  Filled 2023-09-23: qty 15

## 2023-09-23 MED ORDER — SODIUM CHLORIDE 0.9 % IV SOLN
20.0000 ug | Freq: Once | INTRAVENOUS | Status: AC
  Administered 2023-09-23: 20 ug via INTRAVENOUS
  Filled 2023-09-23: qty 5

## 2023-09-23 MED ORDER — ALBUMIN HUMAN 5 % IV SOLN: INTRAVENOUS | Status: DC | PRN

## 2023-09-23 MED ORDER — ORAL CARE MOUTH RINSE
15.0000 mL | Freq: Once | OROMUCOSAL | Status: AC
Start: 2023-09-23 — End: 2023-09-23

## 2023-09-23 MED ORDER — PHENYLEPHRINE HCL-NACL 20-0.9 MG/250ML-% IV SOLN
INTRAVENOUS | Status: DC | PRN
  Administered 2023-09-23: 50 ug/min via INTRAVENOUS

## 2023-09-23 MED ORDER — GABAPENTIN 100 MG PO CAPS
100.0000 mg | ORAL_CAPSULE | Freq: Every day | ORAL | Status: DC
  Administered 2023-09-23: 100 mg via ORAL
  Filled 2023-09-23: qty 1

## 2023-09-23 MED ORDER — HEPARIN 6000 UNIT IRRIGATION SOLUTION
Status: DC | PRN
  Administered 2023-09-23: 1

## 2023-09-23 MED ORDER — DIPHENHYDRAMINE HCL 50 MG/ML IJ SOLN
INTRAMUSCULAR | Status: DC | PRN
Start: 2023-09-23 — End: 2023-09-23
  Administered 2023-09-23: 12.5 mg via INTRAVENOUS

## 2023-09-23 MED ORDER — EPHEDRINE SULFATE-NACL 50-0.9 MG/10ML-% IV SOSY
PREFILLED_SYRINGE | INTRAVENOUS | Status: DC | PRN
  Administered 2023-09-23: 5 mg via INTRAVENOUS

## 2023-09-23 MED ORDER — MIDAZOLAM HCL 2 MG/2ML IJ SOLN
INTRAMUSCULAR | Status: AC
  Filled 2023-09-23: qty 2

## 2023-09-23 MED ORDER — FENTANYL CITRATE (PF) 100 MCG/2ML IJ SOLN: 25.0000 ug | INTRAMUSCULAR | Status: DC | PRN

## 2023-09-23 MED ORDER — ONDANSETRON HCL 4 MG/2ML IJ SOLN: 4.0000 mg | Freq: Four times a day (QID) | INTRAMUSCULAR | Status: DC | PRN

## 2023-09-23 MED ORDER — CHLORHEXIDINE GLUCONATE 4 % EX SOLN: 60.0000 mL | Freq: Once | CUTANEOUS | Status: DC

## 2023-09-23 MED ORDER — ALBUMIN HUMAN 5 % IV SOLN
12.5000 g | Freq: Once | INTRAVENOUS | Status: AC
  Administered 2023-09-23: 12.5 g via INTRAVENOUS

## 2023-09-23 MED ORDER — ATORVASTATIN CALCIUM 40 MG PO TABS
40.0000 mg | ORAL_TABLET | Freq: Every day | ORAL | Status: DC
  Administered 2023-09-23 – 2023-09-24 (×2): 40 mg via ORAL
  Filled 2023-09-23: qty 1
  Filled 2023-09-23: qty 4

## 2023-09-23 MED ORDER — 0.9 % SODIUM CHLORIDE (POUR BTL) OPTIME
TOPICAL | Status: DC | PRN
  Administered 2023-09-23: 1000 mL

## 2023-09-23 MED ORDER — POTASSIUM CHLORIDE CRYS ER 20 MEQ PO TBCR
20.0000 meq | EXTENDED_RELEASE_TABLET | Freq: Once | ORAL | Status: DC
  Filled 2023-09-23: qty 1

## 2023-09-23 MED ORDER — HYDROMORPHONE HCL 1 MG/ML IJ SOLN: 0.5000 mg | INTRAMUSCULAR | Status: DC | PRN

## 2023-09-23 MED ORDER — SODIUM CHLORIDE 0.9 % IV SOLN: INTRAVENOUS | Status: DC

## 2023-09-23 MED ORDER — PANTOPRAZOLE SODIUM 40 MG PO TBEC
40.0000 mg | DELAYED_RELEASE_TABLET | Freq: Every day | ORAL | Status: DC
  Administered 2023-09-23 – 2023-09-24 (×2): 40 mg via ORAL
  Filled 2023-09-23 (×2): qty 1

## 2023-09-23 MED ORDER — HEPARIN SODIUM (PORCINE) 1000 UNIT/ML IJ SOLN
INTRAMUSCULAR | Status: AC
  Filled 2023-09-23: qty 10

## 2023-09-23 MED ORDER — OXYCODONE HCL 5 MG PO TABS
ORAL_TABLET | ORAL | Status: AC
  Filled 2023-09-23: qty 1

## 2023-09-23 MED ORDER — OXYCODONE-ACETAMINOPHEN 5-325 MG PO TABS
1.0000 | ORAL_TABLET | ORAL | Status: DC | PRN
  Administered 2023-09-23: 1 via ORAL
  Filled 2023-09-23: qty 2

## 2023-09-23 MED ORDER — ANASTROZOLE 1 MG PO TABS
1.0000 mg | ORAL_TABLET | Freq: Every day | ORAL | Status: DC
  Administered 2023-09-23: 1 mg via ORAL
  Filled 2023-09-23 (×2): qty 1

## 2023-09-23 MED ORDER — COLCHICINE 0.6 MG PO TABS
0.6000 mg | ORAL_TABLET | ORAL | Status: DC
  Filled 2023-09-23: qty 1

## 2023-09-23 MED ORDER — FENTANYL CITRATE (PF) 250 MCG/5ML IJ SOLN
INTRAMUSCULAR | Status: AC
  Filled 2023-09-23: qty 5

## 2023-09-23 MED ORDER — ONDANSETRON HCL 4 MG/2ML IJ SOLN
INTRAMUSCULAR | Status: DC | PRN
  Administered 2023-09-23: 4 mg via INTRAVENOUS

## 2023-09-23 MED ORDER — METOPROLOL TARTRATE 5 MG/5ML IV SOLN: 2.0000 mg | INTRAVENOUS | Status: DC | PRN

## 2023-09-23 MED ORDER — OXYCODONE HCL 5 MG/5ML PO SOLN: 5.0000 mg | Freq: Once | ORAL | Status: AC | PRN

## 2023-09-23 MED ORDER — OXYCODONE-ACETAMINOPHEN 5-325 MG PO TABS
1.0000 | ORAL_TABLET | Freq: Four times a day (QID) | ORAL | 0 refills | Status: AC | PRN
  Filled 2023-09-23: qty 15, 4d supply, fill #0

## 2023-09-23 MED ORDER — GUAIFENESIN-DM 100-10 MG/5ML PO SYRP: 15.0000 mL | ORAL_SOLUTION | ORAL | Status: DC | PRN

## 2023-09-23 MED ORDER — SUCCINYLCHOLINE CHLORIDE 200 MG/10ML IV SOSY
PREFILLED_SYRINGE | INTRAVENOUS | Status: DC | PRN
  Administered 2023-09-23: 120 mg via INTRAVENOUS

## 2023-09-23 MED ORDER — DEXAMETHASONE SODIUM PHOSPHATE 10 MG/ML IJ SOLN
INTRAMUSCULAR | Status: DC | PRN
  Administered 2023-09-23 (×2): 5 mg via INTRAVENOUS

## 2023-09-23 MED ORDER — HEMOSTATIC AGENTS (NO CHARGE) OPTIME
TOPICAL | Status: DC | PRN
  Administered 2023-09-23: 1 via TOPICAL

## 2023-09-23 MED ORDER — ALBUMIN HUMAN 5 % IV SOLN
INTRAVENOUS | Status: AC
  Filled 2023-09-23: qty 250

## 2023-09-23 MED ORDER — LIDOCAINE 2% (20 MG/ML) 5 ML SYRINGE
INTRAMUSCULAR | Status: DC | PRN
  Administered 2023-09-23: 100 mg via INTRAVENOUS

## 2023-09-23 MED ORDER — CEFAZOLIN SODIUM-DEXTROSE 2-4 GM/100ML-% IV SOLN
2.0000 g | INTRAVENOUS | Status: AC
  Administered 2023-09-23: 2 g via INTRAVENOUS
  Filled 2023-09-23: qty 100

## 2023-09-23 MED ORDER — PROPOFOL 10 MG/ML IV BOLUS
INTRAVENOUS | Status: AC
  Filled 2023-09-23: qty 20

## 2023-09-23 MED ORDER — ACETAMINOPHEN 500 MG PO TABS
1000.0000 mg | ORAL_TABLET | Freq: Once | ORAL | Status: AC
  Administered 2023-09-23: 1000 mg via ORAL
  Filled 2023-09-23: qty 2

## 2023-09-23 MED ORDER — ONDANSETRON HCL 4 MG/2ML IJ SOLN: 4.0000 mg | Freq: Once | INTRAMUSCULAR | Status: DC | PRN

## 2023-09-23 MED ORDER — PHENOL 1.4 % MT LIQD: 1.0000 | OROMUCOSAL | Status: DC | PRN

## 2023-09-23 MED ORDER — PROTAMINE SULFATE 10 MG/ML IV SOLN
INTRAVENOUS | Status: DC | PRN
  Administered 2023-09-23: 45 mg via INTRAVENOUS
  Administered 2023-09-23: 5 mg via INTRAVENOUS

## 2023-09-23 MED ORDER — OXYCODONE HCL 5 MG PO TABS
5.0000 mg | ORAL_TABLET | Freq: Once | ORAL | Status: AC | PRN
  Administered 2023-09-23: 5 mg via ORAL

## 2023-09-23 MED ORDER — FENTANYL CITRATE (PF) 250 MCG/5ML IJ SOLN
INTRAMUSCULAR | Status: DC | PRN
  Administered 2023-09-23 (×3): 50 ug via INTRAVENOUS

## 2023-09-23 MED ORDER — LABETALOL HCL 5 MG/ML IV SOLN: 10.0000 mg | INTRAVENOUS | Status: DC | PRN

## 2023-09-23 MED ORDER — CHLORHEXIDINE GLUCONATE 0.12 % MT SOLN: 15.0000 mL | Freq: Once | OROMUCOSAL | Status: AC

## 2023-09-23 MED ORDER — ALUM & MAG HYDROXIDE-SIMETH 200-200-20 MG/5ML PO SUSP: 15.0000 mL | ORAL | Status: DC | PRN

## 2023-09-23 MED ORDER — MIDODRINE HCL 5 MG PO TABS
10.0000 mg | ORAL_TABLET | Freq: Three times a day (TID) | ORAL | Status: DC
  Administered 2023-09-23 – 2023-09-24 (×3): 10 mg via ORAL
  Filled 2023-09-23 (×3): qty 2

## 2023-09-23 MED ORDER — AMIODARONE HCL 200 MG PO TABS
200.0000 mg | ORAL_TABLET | Freq: Every day | ORAL | Status: DC
  Administered 2023-09-23 – 2023-09-24 (×2): 200 mg via ORAL
  Filled 2023-09-23 (×2): qty 1

## 2023-09-23 MED ORDER — DEXTROSE 50 % IV SOLN
12.5000 g | INTRAVENOUS | Status: AC
  Filled 2023-09-23: qty 50

## 2023-09-23 SURGICAL SUPPLY — 61 items
ADH SKN CLS APL DERMABOND .7 (GAUZE/BANDAGES/DRESSINGS) ×2
ARMBAND PINK RESTRICT EXTREMIT (MISCELLANEOUS) ×3 IMPLANT
BAG COUNTER SPONGE SURGICOUNT (BAG) ×3 IMPLANT
BAG DECANTER FOR FLEXI CONT (MISCELLANEOUS) ×3 IMPLANT
BAG SPNG CNTER NS LX DISP (BAG) ×2
BIOPATCH RED 1 DISK 7.0 (GAUZE/BANDAGES/DRESSINGS) ×2 IMPLANT
BNDG CMPR STD VLCR NS LF 5.8X4 (GAUZE/BANDAGES/DRESSINGS) ×2
BNDG ELASTIC 4X5.8 VLCR NS LF (GAUZE/BANDAGES/DRESSINGS) ×1 IMPLANT
CANISTER SUCT 3000ML PPV (MISCELLANEOUS) ×3 IMPLANT
CATH PALINDROME-P 19CM W/VT (CATHETERS) IMPLANT
CATH PALINDROME-P 23CM W/VT (CATHETERS) IMPLANT
CATH PALINDROME-P 28CM W/VT (CATHETERS) IMPLANT
CLIP LIGATING EXTRA MED SLVR (CLIP) ×3 IMPLANT
CLIP LIGATING EXTRA SM BLUE (MISCELLANEOUS) ×3 IMPLANT
COVER PROBE W GEL 5X96 (DRAPES) ×3 IMPLANT
COVER SURGICAL LIGHT HANDLE (MISCELLANEOUS) ×3 IMPLANT
CUFF TOURN SGL QUICK 18X4 (TOURNIQUET CUFF) ×1 IMPLANT
DERMABOND ADVANCED .7 DNX12 (GAUZE/BANDAGES/DRESSINGS) ×3 IMPLANT
DRAPE C-ARM 42X72 X-RAY (DRAPES) ×2 IMPLANT
DRAPE CHEST BREAST 15X10 FENES (DRAPES) ×2 IMPLANT
DRSG OPSITE POSTOP 4X8 (GAUZE/BANDAGES/DRESSINGS) ×1 IMPLANT
ELECT REM PT RETURN 9FT ADLT (ELECTROSURGICAL) ×2
ELECTRODE REM PT RTRN 9FT ADLT (ELECTROSURGICAL) ×3 IMPLANT
GAUZE 4X4 16PLY ~~LOC~~+RFID DBL (SPONGE) ×2 IMPLANT
GLOVE BIO SURGEON STRL SZ7.5 (GLOVE) ×3 IMPLANT
GOWN STRL REUS W/ TWL LRG LVL3 (GOWN DISPOSABLE) ×6 IMPLANT
GOWN STRL REUS W/ TWL XL LVL3 (GOWN DISPOSABLE) ×3 IMPLANT
GOWN STRL REUS W/TWL LRG LVL3 (GOWN DISPOSABLE) ×4
GOWN STRL REUS W/TWL XL LVL3 (GOWN DISPOSABLE) ×2
HEMOSTAT SNOW SURGICEL 2X4 (HEMOSTASIS) ×1 IMPLANT
KIT BASIN OR (CUSTOM PROCEDURE TRAY) ×3 IMPLANT
KIT PALINDROME-P 55CM (CATHETERS) IMPLANT
KIT TURNOVER KIT B (KITS) ×3 IMPLANT
NDL 18GX1X1/2 (RX/OR ONLY) (NEEDLE) ×2 IMPLANT
NDL HYPO 25GX1X1/2 BEV (NEEDLE) ×2 IMPLANT
NEEDLE 18GX1X1/2 (RX/OR ONLY) (NEEDLE) ×2 IMPLANT
NEEDLE HYPO 25GX1X1/2 BEV (NEEDLE) ×2 IMPLANT
NS IRRIG 1000ML POUR BTL (IV SOLUTION) ×3 IMPLANT
PACK BASIC III (CUSTOM PROCEDURE TRAY)
PACK CV ACCESS (CUSTOM PROCEDURE TRAY) ×3 IMPLANT
PACK SRG BSC III STRL LF ECLPS (CUSTOM PROCEDURE TRAY) ×2 IMPLANT
PAD ARMBOARD 7.5X6 YLW CONV (MISCELLANEOUS) ×6 IMPLANT
POWDER SURGICEL 3.0 GRAM (HEMOSTASIS) IMPLANT
SLING ARM FOAM STRAP LRG (SOFTGOODS) IMPLANT
SLING ARM FOAM STRAP MED (SOFTGOODS) IMPLANT
SOAP 2 % CHG 4 OZ (WOUND CARE) ×3 IMPLANT
STAPLER VISISTAT 35W (STAPLE) ×1 IMPLANT
SUT ETHILON 3 0 PS 1 (SUTURE) ×3 IMPLANT
SUT MNCRL AB 4-0 PS2 18 (SUTURE) ×3 IMPLANT
SUT PROLENE 5 0 C 1 24 (SUTURE) ×3 IMPLANT
SUT PROLENE 6 0 BV (SUTURE) ×4 IMPLANT
SUT VIC AB 3-0 SH 27 (SUTURE) ×4
SUT VIC AB 3-0 SH 27X BRD (SUTURE) ×4 IMPLANT
SYR 10ML LL (SYRINGE) ×2 IMPLANT
SYR 20ML LL LF (SYRINGE) ×4 IMPLANT
SYR 5ML LL (SYRINGE) ×2 IMPLANT
SYR CONTROL 10ML LL (SYRINGE) ×3 IMPLANT
TOWEL GREEN STERILE (TOWEL DISPOSABLE) ×3 IMPLANT
TOWEL GREEN STERILE FF (TOWEL DISPOSABLE) ×6 IMPLANT
UNDERPAD 30X36 HEAVY ABSORB (UNDERPADS AND DIAPERS) ×3 IMPLANT
WATER STERILE IRR 1000ML POUR (IV SOLUTION) ×3 IMPLANT

## 2023-09-23 NOTE — Anesthesia Procedure Notes (Signed)
Procedure Name: Intubation Date/Time: 09/23/2023 8:32 AM  Performed by: Loleta Joshu Furukawa, CRNAPre-anesthesia Checklist: Patient identified, Patient being monitored, Timeout performed, Emergency Drugs available and Suction available Patient Re-evaluated:Patient Re-evaluated prior to induction Oxygen Delivery Method: Circle system utilized Preoxygenation: Pre-oxygenation with 100% oxygen Induction Type: IV induction Ventilation: Mask ventilation without difficulty Laryngoscope Size: Mac and 4 Grade View: Grade I Tube type: Oral Tube size: 7.0 mm Number of attempts: 1 Airway Equipment and Method: Stylet Placement Confirmation: ETT inserted through vocal cords under direct vision, positive ETCO2 and breath sounds checked- equal and bilateral Secured at: 22 cm Tube secured with: Tape Dental Injury: Teeth and Oropharynx as per pre-operative assessment

## 2023-09-23 NOTE — Transfer of Care (Signed)
Immediate Anesthesia Transfer of Care Note  Patient: Brenda Arnold  Procedure(s) Performed: PLICATION OF LEFT ARM FISTULA (Left)  Patient Location: PACU  Anesthesia Type:General  Level of Consciousness: awake  Airway & Oxygen Therapy: Patient Spontanous Breathing  Post-op Assessment: Report given to RN and Post -op Vital signs reviewed and stable  Post vital signs: Reviewed and stable  Last Vitals:  Vitals Value Taken Time  BP 115/62 09/23/23 1000  Temp    Pulse 105 09/23/23 1001  Resp 17 09/23/23 1001  SpO2 100 % 09/23/23 1001  Vitals shown include unfiled device data.  Last Pain:  Vitals:   09/23/23 0755  TempSrc:   PainSc: 0-No pain         Complications: No notable events documented.

## 2023-09-23 NOTE — H&P (Signed)
POST OPERATIVE OFFICE NOTE    Patient seen and examined in preop holding.  No complaints. No changes to medication history or physical exam since last seen in clinic. Pt adamantly refusing TDC. Will try to save enough length for fistula cannulation. She is aware that she may need a TDC should her dialysis center have difficulty with access.  After discussing the risks and benefits of left arm fistula revision for ulceration, Brenda Arnold elected to proceed.   Brenda Sparrow MD    CC:  F/u for surgery  HPI:  This is a 67 y.o. female who is s/p left radial cephalic fistula plication on 07/08/5783 by Brenda Arnold   Dialysis access hx: -left RC AVF 09/02/2012 Brenda Arnold -revision left RC AVF with branch ligation 04/27/2014 Brenda Arnold -plication left RC AVF for aneurysm and ulcer 01/13/2020 Brenda Arnold -plication left RC AVF for ulcer over aneurysm 08/11/2023 Brenda Arnold  Pt states she does not have pain/numbness in the left hand.    She states that Brenda Arnold was worried about an ulceration on her fistula and called for her to be seen today. She states that the area of concern has never bled.  They are sticking on the side of the fistula currently.    The pt is on dialysis M/W/F at [facility] location.   Allergies  Allergen Reactions   Iodine Shortness Of Breath   Shellfish Allergy Shortness Of Breath    Breathing Problems   Amlodipine Swelling   Felodipine Swelling    Headache   Lisinopril Cough   Chlorhexidine Itching    CHG wipes    Current Facility-Administered Medications  Medication Dose Route Frequency Provider Last Rate Last Admin   0.9 %  sodium chloride infusion   Intravenous Continuous Brenda Ghazi, MD       ceFAZolin (ANCEF) IVPB 2g/100 mL premix  2 g Intravenous 30 min Pre-Op Brenda Harman, MD       chlorhexidine (HIBICLENS) 4 % liquid 4 Application  60 mL Topical Once Brenda Harman, MD       And   [START ON 09/24/2023] chlorhexidine  (HIBICLENS) 4 % liquid 4 Application  60 mL Topical Once Brenda Harman, MD       chlorhexidine (PERIDEX) 0.12 % solution 15 mL  15 mL Mouth/Throat Once Brenda Ghazi, MD       Or   Oral care mouth rinse  15 mL Mouth Rinse Once Brenda Ghazi, MD       chlorhexidine (PERIDEX) 0.12 % solution            dextrose 50 % solution 12.5 g  12.5 g Intravenous STAT Brenda Ghazi, MD       dextrose 50 % solution              ROS:  See HPI  Physical Exam:   Incision:  healed nicely.  Extremities:   There is a palpable left radial pulse.   Motor and sensory are in tact.   There is a thrill/bruit present.  Access is  easily palpable There is an ulceration distal to most recent plication.   Unable to pinch skin over this area.      Assessment/Plan:  This is a 67 y.o. female who is s/p: left radial cephalic fistula plication on 05/10/6294 by Brenda Arnold   -the pt does not have evidence of steal. -she does have area of concern with ulceration over the fistula.  Unable to pinch the  skin over this area. Discussed with Dr. Randie Heinz and will plan for plication of this area next Tuesday with Dr. Randie Heinz since this is a non dialysis day and she is on Eliquis.  Discussed with her should she have any bleeding from this area, where to hold pressure and call 911.  She expressed understanding.  I did discuss there is a chance for [facility], which is really does not want to have.  Hopefully, they can stick recently repaired area as it will be 6 weeks at that time.    Brenda Arnold, [facility] [phone]  Clinic MD:  Randie Heinz

## 2023-09-23 NOTE — Plan of Care (Signed)

## 2023-09-23 NOTE — Op Note (Signed)
NAME: [name]    MRN: [id number] DOB: [birth year]    DATE OF OPERATION: 09/23/2023  PREOP DIAGNOSIS:    Left Cimino fistula ulceration  POSTOP DIAGNOSIS:    Same  PROCEDURE:    Left radiocephalic fistula revision  SURGEON: Victorino Sparrow  ASSIST: Kayren Eaves, PA  ANESTHESIA: General  EBL: 75 mL  INDICATIONS:    Brenda Arnold is a 67 y.o. female with history of left-sided radiocephalic fistula creation in 2013.  This fistula has been working well, but has undergone multiple revisions for ulceration.  She presents today with ulcerations that are been present for several weeks.  These have not healed.  She adamantly refused catheter placement.  After discussing the risk and benefits of radiocephalic fistula revision in an effort to decrease risk of spontaneous bleed in the future, marked elected to proceed.  FINDINGS:   Ulceration length 9 cm  TECHNIQUE:   Patient brought to the OR laid in supine position.  General anesthesia was induced and patient prepped draped standard fashion.  The case began with heparin ministration, tourniquet placement, the arm was exsanguinated and tourniquet inflated.  Next, an ellipse incision was made to remove the 9 cm x 3 cm area of ulceration.  Skin was undermined on both the medial and lateral aspects of the forearm.  There was a venotomy appreciated more proximally.  A pair of scissors were used to open the fistula and a small portion resected.  This was primarily closed using 5-0 Prolene suture.  The tourniquet was then removed and flow restored.  There was an excellent thrill.  Heparin was reversed with use of protamine.  DDAVP was administered.  The wound bed was irrigated with copious amounts of saline.  The fascia was closed using interrupted 3-0 Vicryl sutures with staples at the level of the skin.  The patient was taken the PACU in stable condition  Victorino Sparrow, MD [facility] DATE  OF DICTATION:   09/23/2023

## 2023-09-23 NOTE — Anesthesia Preprocedure Evaluation (Signed)
Anesthesia Evaluation  Patient identified by MRN, date of birth, ID band Patient awake    Reviewed: Allergy & Precautions, H&P , NPO status , Patient's Chart, lab work & pertinent test results  Airway Mallampati: II  TM Distance: <3 FB Neck ROM: Full    Dental no notable dental hx.    Pulmonary asthma , sleep apnea    breath sounds clear to auscultation + decreased breath sounds      Cardiovascular hypertension, Normal cardiovascular exam+ dysrhythmias Atrial Fibrillation  Rhythm:Regular Rate:Normal   1. Left ventricular ejection fraction, by estimation, is 70 to 75%. The  left ventricle has hyperdynamic function. The left ventricle has no  regional wall motion abnormalities. There is moderate concentric left  ventricular hypertrophy. Left ventricular  diastolic parameters were normal.   2. Right ventricular systolic function is normal. The right ventricular  size is normal. There is normal pulmonary artery systolic pressure.   3. The mitral valve is grossly normal. Trivial mitral valve  regurgitation. No evidence of mitral stenosis.   4. The aortic valve is tricuspid. There is moderate calcification of the  aortic valve. There is mild thickening of the aortic valve. Aortic valve  regurgitation is not visualized. Mild to moderate aortic valve  sclerosis/calcification is present, without  any evidence of aortic stenosis.   5. The inferior vena cava is normal in size with <50% respiratory  variability, suggesting right atrial pressure of 8 mmHg.      Neuro/Psych negative neurological ROS  negative psych ROS   GI/Hepatic negative GI ROS, Neg liver ROS,,,  Endo/Other  diabetes  Morbid obesity  Renal/GU DialysisRenal disease  negative genitourinary   Musculoskeletal negative musculoskeletal ROS (+)    Abdominal  (+) + obese  Peds negative pediatric ROS (+)  Hematology negative hematology ROS (+)   Anesthesia Other  Findings   Reproductive/Obstetrics negative OB ROS                             Anesthesia Physical Anesthesia Plan  ASA: 4  Anesthesia Plan: General   Post-op Pain Management:    Induction: Intravenous  PONV Risk Score and Plan: 3 and Ondansetron, Dexamethasone and Treatment may vary due to age or medical condition  Airway Management Planned: Oral ETT  Additional Equipment:   Intra-op Plan:   Post-operative Plan: Extubation in OR  Informed Consent: I have reviewed the patients History and Physical, chart, labs and discussed the procedure including the risks, benefits and alternatives for the proposed anesthesia with the patient or authorized representative who has indicated his/her understanding and acceptance.     Dental advisory given  Plan Discussed with: CRNA and Surgeon  Anesthesia Plan Comments:        Anesthesia Quick Evaluation

## 2023-09-23 NOTE — Discharge Instructions (Signed)
[facility]  Discharge Instructions  AV Fistula or Graft Surgery for Dialysis Access  Please refer to the following instructions for your post-procedure care. Your surgeon or physician assistant will discuss any changes with you.  Activity  You may drive the day following your surgery, if you are comfortable and no longer taking prescription pain medication. Resume full activity as the soreness in your incision resolves.  Bathing/Showering  You may shower after you go home. Keep your incision dry for 48 hours. Do not soak in a bathtub, hot tub, or swim until the incision heals completely. You may not shower if you have a hemodialysis catheter.  Incision Care  Clean your incision with mild soap and water after 48 hours. Pat the area dry with a clean towel. You do not need a bandage unless otherwise instructed. Do not apply any ointments or creams to your incision. You may have skin glue on your incision. Do not peel it off. It will come off on its own in about one week. Your arm may swell a bit after surgery. To reduce swelling use pillows to elevate your arm so it is above your heart. Your doctor will tell you if you need to lightly wrap your arm with an ACE bandage.  Diet  Resume your normal diet. There are not special food restrictions following this procedure. In order to heal from your surgery, it is CRITICAL to get adequate nutrition. Your body requires vitamins, minerals, and protein. Vegetables are the best source of vitamins and minerals. Vegetables also provide the perfect balance of protein. Processed food has little nutritional value, so try to avoid this.  Medications  Resume taking all of your medications. If your incision is causing pain, you may take over-the counter pain relievers such as acetaminophen (Tylenol). If you were prescribed a stronger pain medication, please be aware these medications can cause nausea and constipation. Prevent  nausea by taking the medication with a snack or meal. Avoid constipation by drinking plenty of fluids and eating foods with high amount of fiber, such as fruits, vegetables, and grains.  Do not take Tylenol if you are taking prescription pain medications.  Resume taking your Eliquis on Friday, 09/26/2023  Follow up Your surgeon may want to see you in the office following your access surgery. If so, this will be arranged at the time of your surgery.  Please call us immediately for any of the following conditions:  Increased pain, redness, drainage (pus) from your incision site Fever of 101 degrees or higher Severe or worsening pain at your incision site Hand pain or numbness.  Reduce your risk of vascular disease:  Stop smoking. If you would like help, call [facility] at 1-800-QUIT-NOW (807-417-3509) or [place] at 412-099-3185  Manage your cholesterol Maintain a desired weight Control your diabetes Keep your blood pressure down  Dialysis  Your dialysis center can only stick the fistula above the incision site. Your surgeon will tell you when the entire fistula can be used   09/23/2023 Brenda Arnold [id number] 03-May-1956  Surgeon(s): Victorino Sparrow, MD  Procedure(s): PLICATION OF LEFT ARM FISTULA   May stick graft immediately  x May stick fistula above incision site only, until cleared by surgeon       If you have any questions, please call the office at [phone].

## 2023-09-24 ENCOUNTER — Encounter (HOSPITAL_COMMUNITY): Payer: Self-pay | Admitting: Vascular Surgery

## 2023-09-24 DIAGNOSIS — Z992 Dependence on renal dialysis: Secondary | ICD-10-CM | POA: Diagnosis not present

## 2023-09-24 DIAGNOSIS — M25142 Fistula, left hand: Secondary | ICD-10-CM | POA: Diagnosis not present

## 2023-09-24 DIAGNOSIS — N186 End stage renal disease: Secondary | ICD-10-CM | POA: Diagnosis not present

## 2023-09-24 DIAGNOSIS — L98499 Non-pressure chronic ulcer of skin of other sites with unspecified severity: Secondary | ICD-10-CM | POA: Diagnosis not present

## 2023-09-24 LAB — CBC
HCT: 26.4 % — ABNORMAL LOW (ref 36.0–46.0)
Hemoglobin: 8.3 g/dL — ABNORMAL LOW (ref 12.0–15.0)
MCH: 31.1 pg (ref 26.0–34.0)
MCHC: 31.4 g/dL (ref 30.0–36.0)
MCV: 98.9 fL (ref 80.0–100.0)
Platelets: 132 10*3/uL — ABNORMAL LOW (ref 150–400)
RBC: 2.67 MIL/uL — ABNORMAL LOW (ref 3.87–5.11)
RDW: 15.3 % (ref 11.5–15.5)
WBC: 5.9 10*3/uL (ref 4.0–10.5)
nRBC: 0 % (ref 0.0–0.2)

## 2023-09-24 LAB — PROTIME-INR
INR: 1.2 (ref 0.8–1.2)
Prothrombin Time: 15.1 s (ref 11.4–15.2)

## 2023-09-24 LAB — COMPREHENSIVE METABOLIC PANEL
ALT: 16 U/L (ref 0–44)
AST: 21 U/L (ref 15–41)
Albumin: 3.6 g/dL (ref 3.5–5.0)
Alkaline Phosphatase: 195 U/L — ABNORMAL HIGH (ref 38–126)
Anion gap: 14 (ref 5–15)
BUN: 45 mg/dL — ABNORMAL HIGH (ref 8–23)
CO2: 28 mmol/L (ref 22–32)
Calcium: 8.5 mg/dL — ABNORMAL LOW (ref 8.9–10.3)
Chloride: 95 mmol/L — ABNORMAL LOW (ref 98–111)
Creatinine, Ser: 10.42 mg/dL — ABNORMAL HIGH (ref 0.44–1.00)
GFR, Estimated: 4 mL/min — ABNORMAL LOW (ref >60)
Glucose, Bld: 136 mg/dL — ABNORMAL HIGH (ref 70–99)
Potassium: 4.5 mmol/L (ref 3.5–5.1)
Sodium: 137 mmol/L (ref 135–145)
Total Bilirubin: 0.4 mg/dL (ref 0.3–1.2)
Total Protein: 6.3 g/dL — ABNORMAL LOW (ref 6.5–8.1)

## 2023-09-24 LAB — HIV ANTIBODY (ROUTINE TESTING W REFLEX): HIV Screen 4th Generation wRfx: NONREACTIVE

## 2023-09-24 NOTE — Discharge Summary (Signed)
 Discharge Summary  Patient ID: Brenda Arnold [id number] 67 y.o. 1956-04-10  Admit date: 09/23/2023  Discharge date and time: 09/24/2023 11:43 AM   Admitting Physician: Victorino Sparrow, MD   Discharge Physician: Victorino Sparrow, MD  Admission Diagnoses: ESRD (end stage renal disease) on dialysis [facility]) [N18.6, Z99.2]  Discharge Diagnoses: ESRD on Dialysis  Admission Condition: stable  Discharged Condition: fair  Indication for Admission: Brenda Arnold is a 67 y.o. female with history of left-sided radiocephalic fistula creation in 2013.  This fistula has been working well, but has undergone multiple revisions for ulceration.  She presents today with ulcerations that are been present for several weeks.  These have not healed.  She adamantly refused catheter placement.  After discussing the risk and benefits of radiocephalic fistula revision in an effort to decrease risk of spontaneous bleed in the future, marked elected to proceed.   Hospital Course: [name] was admitted on 09/23/23 and underwent left radiocephalic fistula revision by Dr. Karin Lieu. She tolerated the surgery well and was taken to the recovery room in stable condition. She was kept in overnight observation as she lives independently at it was felt safest for her to be observed overnight.  She remained stable overnight. Pain well controlled. Incision intact and well appearing. Very minimal bleeding from staple line. Motor and sensation intact to left hand and arm. Subjective numbness of her left 1st-3rd fingers. No signs of ischemia or significant steal. Palpable radial pulse. She normally dialyzes on Tues/ Thurs/ Saturdays. She missed her regular outpatient dialysis session but with help of Nephrology she was given a new chair for 5:45 am on 09/25/23. She therefore was stable for discharge home. She was given instructions regarding her wound care. PDMP was reviewed and post operative pain medication was sent to  her pharmacy. She will follow up in our office in 2-3 weeks for incision check.   Consults: None  Treatments: antibiotics: Ancef, analgesia: Fentanyl, acetaminophen and oxycodone, and surgery: left radiocephalic fistula revision  Discharge Exam:  Vitals:   09/24/23 0600 09/24/23 0825  BP: (!) 99/46 (!) 100/49  Pulse: 65 76  Resp:  16  Temp: 98.8 F (37.1 C) 98.1 F (36.7 C)  SpO2: 100% 96%   Physical Exam: Cardiac:  regular Lungs:  non labored Incisions:  left wrist incision is intact with honeycomb dressing. Small amount of blood present along staple line. 2+ radial pulse. Excellent thrill. ACE re applied. No swelling. 4/5 grip strength Neurologic: alert and oriented   Disposition: Discharge disposition: 01-Home or Self Care       Patient Instructions:  Allergies as of 09/24/2023       Reactions   Iodine Shortness Of Breath   Shellfish Allergy Shortness Of Breath   Breathing Problems   Amlodipine Swelling   Felodipine Swelling   Headache   Lisinopril Cough   Chlorhexidine Itching   CHG wipes        Medication List     TAKE these medications    acetaminophen 650 MG CR tablet Commonly known as: TYLENOL Take 650 mg by mouth every 8 (eight) hours as needed for pain. What changed: Another medication with the same name was changed. Make sure you understand how and when to take each.   acetaminophen 500 MG tablet Commonly known as: TYLENOL Take 2 tablets (1,000 mg total) by mouth every 8 (eight) hours as needed for mild pain. What changed: how much to take   amiodarone 200 MG tablet Commonly known  as: PACERONE Take 1 tablet (200 mg total) by mouth daily.   anastrozole 1 MG tablet Commonly known as: ARIMIDEX Take 1 tablet (1 mg total) by mouth daily.   atorvastatin 40 MG tablet Commonly known as: LIPITOR Take 1 tablet (40 mg total) by mouth daily.   bisacodyl 5 MG EC tablet Commonly known as: DULCOLAX Take 1 tablet (5 mg total) by mouth daily as  needed for moderate constipation.   ciprofloxacin-dexamethasone OTIC suspension Commonly known as: CIPRODEX Place 4 drops into both ears 2 (two) times daily as needed (ear wax build up).   colchicine 0.6 MG tablet TAKE 0.5 TABLETS (0.3 MG TOTAL) BY MOUTH EVERY MONDAY, WEDNESDAY, AND FRIDAY.   DIALYVITE TABLET Tabs Take 1 tablet by mouth at bedtime.   Eliquis 5 MG Tabs tablet Generic drug: apixaban Take 1 tablet (5 mg total) by mouth 2 (two) times daily.   ethyl chloride spray Apply 1 application  topically every Monday, Wednesday, and Friday with hemodialysis.   fluticasone 50 MCG/ACT nasal spray Commonly known as: FLONASE Place 2 sprays into both nostrils daily. What changed:  when to take this reasons to take this   gabapentin 100 MG capsule Commonly known as: NEURONTIN Take 100 mg by mouth at bedtime.   loratadine 10 MG tablet Commonly known as: CLARITIN Take 1 tablet (10 mg total) by mouth daily. What changed:  when to take this reasons to take this   midodrine 10 MG tablet Commonly known as: PROAMATINE Take 1 tablet (10 mg total) by mouth 3 (three) times daily with meals.   oxyCODONE-acetaminophen 5-325 MG tablet Commonly known as: Percocet Take 1 tablet by mouth every 6 (six) hours as needed for severe pain (pain score 7-10) or moderate pain (pain score 4-6).   Velphoro 500 MG chewable tablet Generic drug: sucroferric oxyhydroxide Chew 2 tablets (1,000 mg total) by mouth 3 (three) times daily with meals. What changed:  how much to take when to take this additional instructions               Discharge Care Instructions  (From admission, onward)           Start     Ordered   09/23/23 0000  Discharge wound care:       Comments: Take the ace wrap and bandage off your incision on 10/23. You can place a dry bandage on your incision as needed for any bloody drainage   09/23/23 0957           Activity: activity as tolerated, no driving while  on analgesics, and no heavy lifting for 4-6 weeks Diet: renal diet Wound Care:  Leave dressings on for 24 hours. You can then wash with mild soap and water, pat dry. Do not soak in bathtub. Okay to apply dry guaze as needed if any drainage. ACE wrap as needed for swelling  Follow-up with [facility] in  2-3  weeks.  SignedGraceann Congress, PA-C 09/24/2023 2:10 PM [facility] Office: [phone]

## 2023-09-24 NOTE — Plan of Care (Signed)
Informed of patient in hospital. S/p AVF revision yesterday. Open for discharge provided we have a outpatient HD plan otherwise will need to dialyze here prior to d/c which may be delayed today. Contacted [facility] and they will work her in for tomorrow morning for 5:45am chair time, needs to arrive by 5:20am. Discussed with primary team. Please call us with any questions/concerns or if patient ends up staying.  Anthony Sar, MD [facility]

## 2023-09-24 NOTE — Plan of Care (Signed)
  Problem: Education: Goal: Knowledge of General Education information will improve Description: Including pain rating scale, medication(s)/side effects and non-pharmacologic comfort measures 09/24/2023 0852 by Dorian Pod, RN Outcome: Adequate for Discharge 09/24/2023 [phone] by Dorian Pod, RN Outcome: Adequate for Discharge   Problem: Health Behavior/Discharge Planning: Goal: Ability to manage health-related needs will improve 09/24/2023 0852 by Dorian Pod, RN Outcome: Adequate for Discharge 09/24/2023 [phone] by Dorian Pod, RN Outcome: Adequate for Discharge   Problem: Clinical Measurements: Goal: Ability to maintain clinical measurements within normal limits will improve 09/24/2023 0852 by Dorian Pod, RN Outcome: Adequate for Discharge 09/24/2023 [phone] by Dorian Pod, RN Outcome: Adequate for Discharge Goal: Will remain free from infection 09/24/2023 0852 by Dorian Pod, RN Outcome: Adequate for Discharge 09/24/2023 [phone] by Dorian Pod, RN Outcome: Adequate for Discharge Goal: Diagnostic test results will improve 09/24/2023 0852 by Dorian Pod, RN Outcome: Adequate for Discharge 09/24/2023 [phone] by Dorian Pod, RN Outcome: Adequate for Discharge Goal: Respiratory complications will improve 09/24/2023 0852 by Dorian Pod, RN Outcome: Adequate for Discharge 09/24/2023 [phone] by Dorian Pod, RN Outcome: Adequate for Discharge Goal: Cardiovascular complication will be avoided 09/24/[phone] by Dorian Pod, RN Outcome: Adequate for Discharge 09/24/2023 [phone] by Dorian Pod, RN Outcome: Adequate for Discharge   Problem: Activity: Goal: Risk for activity intolerance will decrease 09/24/2023 0852 by Dorian Pod, RN Outcome: Adequate for Discharge 09/24/2023 [phone] by Dorian Pod, RN Outcome: Adequate for Discharge   Problem: Nutrition: Goal: Adequate nutrition will be maintained 09/24/2023 0852 by Dorian Pod,  RN Outcome: Adequate for Discharge 09/24/2023 [phone] by Dorian Pod, RN Outcome: Adequate for Discharge   Problem: Coping: Goal: Level of anxiety will decrease 09/24/2023 0852 by Dorian Pod, RN Outcome: Adequate for Discharge 09/24/2023 [phone] by Dorian Pod, RN Outcome: Adequate for Discharge   Problem: Elimination: Goal: Will not experience complications related to bowel motility 09/24/2023 0852 by Dorian Pod, RN Outcome: Adequate for Discharge 09/24/2023 [phone] by Dorian Pod, RN Outcome: Adequate for Discharge Goal: Will not experience complications related to urinary retention 09/24/2023 0852 by Dorian Pod, RN Outcome: Adequate for Discharge 09/24/2023 [phone] by Dorian Pod, RN Outcome: Adequate for Discharge   Problem: Pain Management: Goal: General experience of comfort will improve 09/24/2023 0852 by Dorian Pod, RN Outcome: Adequate for Discharge 09/24/2023 [phone] by Dorian Pod, RN Outcome: Adequate for Discharge   Problem: Safety: Goal: Ability to remain free from injury will improve 09/24/2023 0852 by Dorian Pod, RN Outcome: Adequate for Discharge 09/24/2023 [phone] by Dorian Pod, RN Outcome: Adequate for Discharge   Problem: Skin Integrity: Goal: Risk for impaired skin integrity will decrease 09/24/2023 0852 by Dorian Pod, RN Outcome: Adequate for Discharge 09/24/2023 [phone] by Dorian Pod, RN Outcome: Adequate for Discharge

## 2023-09-24 NOTE — Progress Notes (Addendum)
  Progress Note    09/24/2023 7:09 AM 1 Day Post-Op  Subjective:  thumb, 2nd and third fingers numb. Denies any pain   Vitals:   09/23/23 2015 09/24/23 0600  BP: (!) 100/49 (!) 99/46  Pulse:  65  Resp:    Temp: 97.8 F (36.6 C) 98.8 F (37.1 C)  SpO2: 98% 100%   Physical Exam: Cardiac:  regular Lungs:  non labored Incisions:  left wrist incision is intact with honeycomb dressing. Small amount of blood present along staple line. 2+ radial pulse. Excellent thrill. ACE re applied. No swelling. 4/5 grip strength Neurologic: alert and oriented  CBC    Component Value Date/Time   WBC 5.9 09/24/2023 0429   RBC 2.67 (L) 09/24/2023 0429   HGB 8.3 (L) 09/24/2023 0429   HCT 26.4 (L) 09/24/2023 0429   PLT 132 (L) 09/24/2023 0429   MCV 98.9 09/24/2023 0429   MCH 31.1 09/24/2023 0429   MCHC 31.4 09/24/2023 0429   RDW 15.3 09/24/2023 0429   LYMPHSABS 1.8 04/16/2022 0004   MONOABS 1.3 (H) 04/16/2022 0004   EOSABS 0.1 04/16/2022 0004   BASOSABS 0.0 04/16/2022 0004    BMET    Component Value Date/Time   NA 137 09/24/2023 0429   K 4.5 09/24/2023 0429   CL 95 (L) 09/24/2023 0429   CO2 28 09/24/2023 0429   GLUCOSE 136 (H) 09/24/2023 0429   BUN 45 (H) 09/24/2023 0429   CREATININE 10.42 (H) 09/24/2023 0429   CALCIUM 8.5 (L) 09/24/2023 0429   GFRNONAA 4 (L) 09/24/2023 0429   GFRAA 5 (L) 01/13/2020 1233    INR    Component Value Date/Time   INR 1.2 09/24/2023 0429   INR 2.5 04/26/2010 1611     Intake/Output Summary (Last 24 hours) at 09/24/2023 0709 Last data filed at 09/23/2023 1000 Gross per 24 hour  Intake 550 ml  Output 10 ml  Net 540 ml     Assessment/Plan:  67 y.o. female is s/p left radiocephalic fistula revision  1 Day Post-Op   Left RC fistula incision intact and well appearing Excellent thrill in fistula Some numbness in 1st-3rd fingers. Radial nerve manipulation during surgery. Hopefully this will resolve with time Will discuss with Nephrology  getting her outpt vs inpatient HD as she is missing her usual morning chair Stable for discharge home today She will follow up in 3 weeks in our office for incision check   Dory Horn [facility] [phone] 09/24/2023 7:09 AM  VASCULAR STAFF ADDENDUM: I have independently interviewed and examined the patient. I agree with the above.  Excellent thrill  Paresthesias in the first and third digits.  Motor intact.  No numbness, feels hypersensitive per patient The radial nerve was plastered on the side of the fistula and was gently moved.  The nerve remained intact.  I think that her symptoms should improve with time. Can discharge today. Should have enough space to cannulate.  If this is challenging, will need TDC. Refused TDC yesterday   Victorino Sparrow MD [facility] Phone Number: [phone] 09/24/2023 9:42 AM

## 2023-09-24 NOTE — Anesthesia Postprocedure Evaluation (Signed)
Anesthesia Post Note  Patient: Brenda Arnold Service  Procedure(s) Performed: PLICATION OF LEFT ARM FISTULA (Left)     Patient location during evaluation: PACU Anesthesia Type: General Level of consciousness: awake and alert Pain management: pain level controlled Vital Signs Assessment: post-procedure vital signs reviewed and stable Respiratory status: spontaneous breathing, nonlabored ventilation, respiratory function stable and patient connected to nasal cannula oxygen Cardiovascular status: blood pressure returned to baseline and stable Postop Assessment: no apparent nausea or vomiting Anesthetic complications: no  No notable events documented.  Last Vitals:  Vitals:   09/24/23 0600 09/24/23 0825  BP: (!) 99/46 (!) 100/49  Pulse: 65 76  Resp:  16  Temp: 37.1 C 36.7 C  SpO2: 100% 96%    Last Pain:  Vitals:   09/24/23 0730  TempSrc:   PainSc: 2                  Marykay Mccleod S

## 2023-09-24 NOTE — Plan of Care (Signed)

## 2023-09-24 NOTE — TOC Transition Note (Addendum)
Transition of Care [facility]) - CM/SW Discharge Note   Patient Details  Name: Brenda Arnold MRN: [id number] Date of Birth: Apr 29, 1956  [facility] CM/SW Contact:  Tom-Johnson, Hershal Coria, RN Phone Number: 09/24/2023, 10:40 AM   Clinical Narrative:     Patient is scheduled for discharge today.  Outpatient f/u, hospital f/u and discharge instructions on AVS. Prescriptions sent to [facility] pharmacy and meds will be delivered to patient at bedside prior discharge. No TOC needs or recommendations noted. Patient drove self to the ED and will transport self at discharge.  No further TOC needs noted.       Final next level of care: Home/Self Care Barriers to Discharge: Barriers Resolved   Patient Goals and CMS Choice CMS Medicare.gov Compare Post Acute Care list provided to:: Patient Choice offered to / list presented to : NA  Discharge Placement                  Patient to be transferred to facility by: Self      Discharge Plan and Services Additional resources added to the After Visit Summary for                  DME Arranged: N/A DME Agency: NA       HH Arranged: NA HH Agency: NA        Social Determinants of Health (SDOH) Interventions SDOH Screenings   Food Insecurity: No Food Insecurity (09/23/2023)  Housing: Low Risk  (09/23/2023)  Transportation Needs: No Transportation Needs (09/23/2023)  Utilities: Not At Risk (09/23/2023)  Depression (PHQ2-9): Low Risk  (04/24/2023)  Tobacco Use: Low Risk  (09/23/2023)     Readmission Risk Interventions     No data to display

## 2023-09-25 DIAGNOSIS — N2581 Secondary hyperparathyroidism of renal origin: Secondary | ICD-10-CM | POA: Diagnosis not present

## 2023-09-25 DIAGNOSIS — N186 End stage renal disease: Secondary | ICD-10-CM | POA: Diagnosis not present

## 2023-09-25 DIAGNOSIS — Z23 Encounter for immunization: Secondary | ICD-10-CM | POA: Diagnosis not present

## 2023-09-25 DIAGNOSIS — D631 Anemia in chronic kidney disease: Secondary | ICD-10-CM | POA: Diagnosis not present

## 2023-09-25 DIAGNOSIS — Z992 Dependence on renal dialysis: Secondary | ICD-10-CM | POA: Diagnosis not present

## 2023-09-26 DIAGNOSIS — N2581 Secondary hyperparathyroidism of renal origin: Secondary | ICD-10-CM | POA: Diagnosis not present

## 2023-09-26 DIAGNOSIS — D631 Anemia in chronic kidney disease: Secondary | ICD-10-CM | POA: Diagnosis not present

## 2023-09-26 DIAGNOSIS — N186 End stage renal disease: Secondary | ICD-10-CM | POA: Diagnosis not present

## 2023-09-26 DIAGNOSIS — Z992 Dependence on renal dialysis: Secondary | ICD-10-CM | POA: Diagnosis not present

## 2023-09-26 DIAGNOSIS — Z23 Encounter for immunization: Secondary | ICD-10-CM | POA: Diagnosis not present

## 2023-09-29 DIAGNOSIS — N2581 Secondary hyperparathyroidism of renal origin: Secondary | ICD-10-CM | POA: Diagnosis not present

## 2023-09-29 DIAGNOSIS — Z992 Dependence on renal dialysis: Secondary | ICD-10-CM | POA: Diagnosis not present

## 2023-09-29 DIAGNOSIS — N186 End stage renal disease: Secondary | ICD-10-CM | POA: Diagnosis not present

## 2023-09-29 DIAGNOSIS — Z23 Encounter for immunization: Secondary | ICD-10-CM | POA: Diagnosis not present

## 2023-09-29 DIAGNOSIS — D631 Anemia in chronic kidney disease: Secondary | ICD-10-CM | POA: Diagnosis not present

## 2023-09-30 ENCOUNTER — Encounter: Payer: Self-pay | Admitting: Podiatry

## 2023-09-30 ENCOUNTER — Ambulatory Visit (INDEPENDENT_AMBULATORY_CARE_PROVIDER_SITE_OTHER): Payer: Medicare Other | Admitting: Podiatry

## 2023-09-30 VITALS — Ht 67.0 in | Wt 235.0 lb

## 2023-09-30 DIAGNOSIS — M2142 Flat foot [pes planus] (acquired), left foot: Secondary | ICD-10-CM | POA: Diagnosis not present

## 2023-09-30 DIAGNOSIS — L84 Corns and callosities: Secondary | ICD-10-CM

## 2023-09-30 DIAGNOSIS — B351 Tinea unguium: Secondary | ICD-10-CM | POA: Diagnosis not present

## 2023-09-30 DIAGNOSIS — M2042 Other hammer toe(s) (acquired), left foot: Secondary | ICD-10-CM

## 2023-09-30 DIAGNOSIS — M79674 Pain in right toe(s): Secondary | ICD-10-CM | POA: Diagnosis not present

## 2023-09-30 DIAGNOSIS — M2041 Other hammer toe(s) (acquired), right foot: Secondary | ICD-10-CM

## 2023-09-30 DIAGNOSIS — M2141 Flat foot [pes planus] (acquired), right foot: Secondary | ICD-10-CM

## 2023-09-30 DIAGNOSIS — M79675 Pain in left toe(s): Secondary | ICD-10-CM | POA: Diagnosis not present

## 2023-09-30 DIAGNOSIS — Z992 Dependence on renal dialysis: Secondary | ICD-10-CM

## 2023-09-30 DIAGNOSIS — E0822 Diabetes mellitus due to underlying condition with diabetic chronic kidney disease: Secondary | ICD-10-CM

## 2023-09-30 DIAGNOSIS — N186 End stage renal disease: Secondary | ICD-10-CM | POA: Diagnosis not present

## 2023-10-01 DIAGNOSIS — Z23 Encounter for immunization: Secondary | ICD-10-CM | POA: Diagnosis not present

## 2023-10-01 DIAGNOSIS — N186 End stage renal disease: Secondary | ICD-10-CM | POA: Diagnosis not present

## 2023-10-01 DIAGNOSIS — Z992 Dependence on renal dialysis: Secondary | ICD-10-CM | POA: Diagnosis not present

## 2023-10-01 DIAGNOSIS — D631 Anemia in chronic kidney disease: Secondary | ICD-10-CM | POA: Diagnosis not present

## 2023-10-01 DIAGNOSIS — N2581 Secondary hyperparathyroidism of renal origin: Secondary | ICD-10-CM | POA: Diagnosis not present

## 2023-10-01 NOTE — Progress Notes (Signed)
Subjective:  Patient ID: Brenda Arnold, female    DOB: 26-Sep-1956,  MRN: [id number]  Brenda Arnold presents to clinic today for: at risk foot care with h/o NIDDM with ESRD on hemodialysis and callus(es) of both feet and painful thick toenails that are difficult to trim. Painful toenails interfere with ambulation. Aggravating factors include wearing enclosed shoe gear. Pain is relieved with periodic professional debridement. Painful calluses are aggravated when weightbearing with and without shoegear. Pain is relieved with periodic professional debridement.  Chief Complaint  Patient presents with   Nail Problem    DFC, Pt is a diabetic, last A1C was 6 last office visit was 3-5 months ago and PCP is Dr Jacqulyn Bath.    PCP is [name], Kasandra Knudsen, MD.  Allergies  Allergen Reactions   Iodine Shortness Of Breath   Shellfish Allergy Shortness Of Breath    Breathing Problems   Amlodipine Swelling   Felodipine Swelling    Headache   Lisinopril Cough   Chlorhexidine Itching    CHG wipes    Review of Systems: Negative except as noted in the HPI.  Objective: No changes noted in today's physical examination. There were no vitals filed for this visit.  Brenda Arnold is a pleasant 67 y.o. female in NAD. AAO x 3.  Vascular Examination: Capillary refill time <4 seconds b/l LE. Palpable pedal pulses b/l LE. Digital hair absent b/l. No pedal edema b/l. Skin temperature gradient warm to cool b/l. No varicosities b/l. No ischemia or gangrene noted b/l LE. No cyanosis or clubbing noted b/l LE.Brenda Arnold  Dermatological Examination: Pedal skin with normal turgor, texture and tone b/l. No open wounds. No interdigital macerations b/l. Toenails 1-5 b/l thickened, discolored, dystrophic with subungual debris. There is pain on palpation to dorsal aspect of nailplates. Hyperkeratotic lesion(s) submet head 5 b/l.  No erythema, no edema, no drainage, no fluctuance..  Neurological Examination: Protective  sensation intact with 10 gram monofilament b/l LE. Vibratory sensation intact b/l LE. Pt has subjective symptoms of neuropathy.  Musculoskeletal Examination: Muscle strength 5/5 to all lower extremity muscle groups bilaterally. Hammertoe deformity noted 1-5 b/l. Pes planus deformity noted bilateral LE.      Latest Ref Rng & Units 05/02/2023   11:30 AM  Hemoglobin A1C  Hemoglobin-A1c 4.8 - 5.6 % 5.2    Assessment/Plan: 1. Pain due to onychomycosis of toenails of both feet   2. Callus   3. Acquired hammertoes of both feet   4. Pes planus of both feet   5. Diabetes mellitus due to underlying condition with chronic kidney disease on chronic dialysis, without long-term current use of insulin (HCC)     FOR HOME USE ONLY DME DIABETIC SHOE Orders Placed This Encounter  Procedures   For Home Use Only DME Diabetic Shoe    Dispense one pair extra depth shoes and 3 pair custom molded insoles. Offload calluses submet head 5 bilaterally.   -Consent given for treatment as described below: -Examined patient. -Discussed diabetic shoe benefit available based on patient's diagnoses. Patient/POA would like to proceed. Order entered for one pair extra depth shoes and 3 pair custom molded insoles. Patient qualifies based on diagnoses. -Mycotic toenails 1-5 bilaterally were debrided in length and girth with sterile nail nippers and dremel without incident. -Callus(es) submet head 5 b/l pared utilizing sharp debridement with sterile blade without complication or incident. Total number debrided =2. -Patient/POA to call should there be question/concern in the interim.   Return in about 3 months (around  12/31/2023).  Brenda Arnold, DPM

## 2023-10-02 DIAGNOSIS — C50911 Malignant neoplasm of unspecified site of right female breast: Secondary | ICD-10-CM | POA: Diagnosis not present

## 2023-10-02 DIAGNOSIS — E7800 Pure hypercholesterolemia, unspecified: Secondary | ICD-10-CM | POA: Diagnosis not present

## 2023-10-02 DIAGNOSIS — E1121 Type 2 diabetes mellitus with diabetic nephropathy: Secondary | ICD-10-CM | POA: Diagnosis not present

## 2023-10-02 DIAGNOSIS — I700 Atherosclerosis of aorta: Secondary | ICD-10-CM | POA: Diagnosis not present

## 2023-10-02 DIAGNOSIS — D631 Anemia in chronic kidney disease: Secondary | ICD-10-CM | POA: Diagnosis not present

## 2023-10-02 DIAGNOSIS — I5032 Chronic diastolic (congestive) heart failure: Secondary | ICD-10-CM | POA: Diagnosis not present

## 2023-10-02 DIAGNOSIS — Z0000 Encounter for general adult medical examination without abnormal findings: Secondary | ICD-10-CM | POA: Diagnosis not present

## 2023-10-02 DIAGNOSIS — I82401 Acute embolism and thrombosis of unspecified deep veins of right lower extremity: Secondary | ICD-10-CM | POA: Diagnosis not present

## 2023-10-02 DIAGNOSIS — N186 End stage renal disease: Secondary | ICD-10-CM | POA: Diagnosis not present

## 2023-10-02 DIAGNOSIS — I480 Paroxysmal atrial fibrillation: Secondary | ICD-10-CM | POA: Diagnosis not present

## 2023-10-02 DIAGNOSIS — Z992 Dependence on renal dialysis: Secondary | ICD-10-CM | POA: Diagnosis not present

## 2023-10-02 DIAGNOSIS — I129 Hypertensive chronic kidney disease with stage 1 through stage 4 chronic kidney disease, or unspecified chronic kidney disease: Secondary | ICD-10-CM | POA: Diagnosis not present

## 2023-10-03 DIAGNOSIS — N2581 Secondary hyperparathyroidism of renal origin: Secondary | ICD-10-CM | POA: Diagnosis not present

## 2023-10-03 DIAGNOSIS — N186 End stage renal disease: Secondary | ICD-10-CM | POA: Diagnosis not present

## 2023-10-03 DIAGNOSIS — D631 Anemia in chronic kidney disease: Secondary | ICD-10-CM | POA: Diagnosis not present

## 2023-10-03 DIAGNOSIS — Z992 Dependence on renal dialysis: Secondary | ICD-10-CM | POA: Diagnosis not present

## 2023-10-03 DIAGNOSIS — E1122 Type 2 diabetes mellitus with diabetic chronic kidney disease: Secondary | ICD-10-CM | POA: Diagnosis not present

## 2023-10-06 DIAGNOSIS — Z992 Dependence on renal dialysis: Secondary | ICD-10-CM | POA: Diagnosis not present

## 2023-10-06 DIAGNOSIS — D631 Anemia in chronic kidney disease: Secondary | ICD-10-CM | POA: Diagnosis not present

## 2023-10-06 DIAGNOSIS — N2581 Secondary hyperparathyroidism of renal origin: Secondary | ICD-10-CM | POA: Diagnosis not present

## 2023-10-06 DIAGNOSIS — N186 End stage renal disease: Secondary | ICD-10-CM | POA: Diagnosis not present

## 2023-10-08 DIAGNOSIS — D631 Anemia in chronic kidney disease: Secondary | ICD-10-CM | POA: Diagnosis not present

## 2023-10-08 DIAGNOSIS — Z992 Dependence on renal dialysis: Secondary | ICD-10-CM | POA: Diagnosis not present

## 2023-10-08 DIAGNOSIS — N2581 Secondary hyperparathyroidism of renal origin: Secondary | ICD-10-CM | POA: Diagnosis not present

## 2023-10-08 DIAGNOSIS — N186 End stage renal disease: Secondary | ICD-10-CM | POA: Diagnosis not present

## 2023-10-09 ENCOUNTER — Telehealth: Payer: Self-pay | Admitting: Adult Health

## 2023-10-09 NOTE — Telephone Encounter (Signed)
Patient is aware of rescheduled appointment times/dates 

## 2023-10-10 DIAGNOSIS — D631 Anemia in chronic kidney disease: Secondary | ICD-10-CM | POA: Diagnosis not present

## 2023-10-10 DIAGNOSIS — N2581 Secondary hyperparathyroidism of renal origin: Secondary | ICD-10-CM | POA: Diagnosis not present

## 2023-10-10 DIAGNOSIS — N186 End stage renal disease: Secondary | ICD-10-CM | POA: Diagnosis not present

## 2023-10-10 DIAGNOSIS — Z992 Dependence on renal dialysis: Secondary | ICD-10-CM | POA: Diagnosis not present

## 2023-10-13 DIAGNOSIS — N2581 Secondary hyperparathyroidism of renal origin: Secondary | ICD-10-CM | POA: Diagnosis not present

## 2023-10-13 DIAGNOSIS — Z992 Dependence on renal dialysis: Secondary | ICD-10-CM | POA: Diagnosis not present

## 2023-10-13 DIAGNOSIS — D631 Anemia in chronic kidney disease: Secondary | ICD-10-CM | POA: Diagnosis not present

## 2023-10-13 DIAGNOSIS — N186 End stage renal disease: Secondary | ICD-10-CM | POA: Diagnosis not present

## 2023-10-14 NOTE — Progress Notes (Unsigned)
Cardiology Office Note    Patient Name: Brenda Arnold Date of Encounter: 10/14/2023  Primary Care Provider:  [name], MD Primary Cardiologist:  Armanda Magic, MD Primary Electrophysiologist: None   Past Medical History    Past Medical History:  Diagnosis Date   Agatston coronary artery calcium score greater than 400    Ca score 3824 on CT 08/2021   Anemia    Arthritis    Asthma    Breast cancer (HCC) 04/04/2023   Complication of anesthesia    difficulty with getting oxygen saturation up- "patient was not aware"  Bottom of feet burning in PACU   Constipation    because of Iron   Diabetes mellitus    not on meds   DVT (deep venous thrombosis) (HCC) 2019   post hip replacement - right   Dyslipidemia    Epistaxis 11/05/2012   ESRD (end stage renal disease) on dialysis Brenda Arnold)    "MWF; [facility]" (09/15/2018)- started 02/13/2017   History of blood transfusion    HTN (hypertension)    pt denies this. She states her "BP runs low now"   Hyperparathyroidism due to renal insufficiency (HCC)    Hypotension    Neuromuscular disorder (HCC)    Neuropathy    Obesity    s/p panniculectomy   Paroxysmal A-fib (HCC)    a. chronic coumadin;  b. 12/2009 Echo: EF 60-65%, Gr 1 DD.   PCOS (polycystic ovarian syndrome)    Pericarditis 08/2019   pericarditis with pericardial effusion, s/p right VATS/pericardial window 08/31/19   Pneumonia    Seasonal allergies    Sleep apnea    a. not using CPAP, last study  >8 yrs   Vitamin D deficiency     History of Present Illness  Brenda Arnold is a 67 y.o. female with PMH of paroxysmal A-fib (on Eliquis), right breast cancer diagnosed 2024 s/p lumpectomy cardiac tamponade and 2028 s/p pericardial window, ESRD on HD, HLD, OSA (unable to tolerate CPAP) ,anemia, DVT, HTN, obesity who presents today for follow-up of coronary artery disease and atrial fibrillation.   [name] was last seen on 09/27/2022 for annual follow-up.  During  her visit she reported doing well from a cardiac perspective she endorsed some shortness of breath with ambulation and elevated heart rate that improved with rest.  Her blood pressure was well-controlled and she endorsed 2 episodes of palpitations that occurred after missing 2 doses of amiodarone that resolved once resulted zooming medication.  She was still requiring midodrine on dialysis days due to hypotension and patient was encouraged to pursue exercise with Silver sneakers at the [facility].  She was unfortunately diagnosed with right breast cancer which was confirmed by biopsy 04/04/2023.  She underwent lumpectomy with negative margins and no recommendation of systemic chemotherapy but underwent 4 weeks of radiation therapy to the right breast.  She will follow this up with antiestrogen therapy.  [name] presents for a routine follow-up.  She report occasional episodes of rapid heart rate, which they describe as feeling like atrial fibrillation. These episodes typically last up to an hour and often only occur during dialysis or after prolonged periods of sitting. They manage their blood pressure during dialysis with midodrine.  She has tried to maintain active lifestyle however due to current insurance plan she is unable to participate in Silver sneakers.  She is planning to switch to a Medicare Advantage plan to cover the cost of a fitness program at their local [employer].  They are also actively managing their diet to minimize fluid retention and salt intake, which they understand can exacerbate their renal and cardiac conditions.   Review of Systems  Please see the history of present illness.    All other systems reviewed and are otherwise negative except as noted above.  Physical Exam    Wt Readings from Last 3 Encounters:  09/30/23 235 lb (106.6 kg)  09/23/23 235 lb (106.6 kg)  08/26/23 235 lb 8 oz (106.8 kg)   MV:HQION were no vitals filed for this visit.,There is no height or weight on file to  calculate BMI. GEN: Well nourished, well developed in no acute distress Neck: No JVD; No carotid bruits Pulmonary: Clear to auscultation without rales, wheezing or rhonchi  Cardiovascular: Normal rate. Regular rhythm. Normal S1. Normal S2.   Murmurs: There is no murmur.  ABDOMEN: Soft, non-tender, non-distended EXTREMITIES:  No edema; No deformity   EKG/LABS/ Recent Cardiac Studies   ECG personally reviewed by me today -sinus rhythm with RBBB and no acute changes consistent with previous EKG and rate of 67 bpm  Risk Assessment/Calculations:    CHA2DS2-VASc Score = 6   This indicates a 9.7% annual risk of stroke. The patient's score is based upon: CHF History: 1 HTN History: 1 Diabetes History: 1 Stroke History: 0 Vascular Disease History: 1 Age Score: 1 Gender Score: 1         Lab Results  Component Value Date   WBC 5.9 09/24/2023   HGB 8.3 (L) 09/24/2023   HCT 26.4 (L) 09/24/2023   MCV 98.9 09/24/2023   PLT 132 (L) 09/24/2023   Lab Results  Component Value Date   CREATININE 10.42 (H) 09/24/2023   BUN 45 (H) 09/24/2023   NA 137 09/24/2023   K 4.5 09/24/2023   CL 95 (L) 09/24/2023   CO2 28 09/24/2023   Lab Results  Component Value Date   CHOL 164 03/18/2021   HDL 46 03/18/2021   LDLCALC 103 (H) 03/18/2021   TRIG 75 03/18/2021   CHOLHDL 3.6 03/18/2021    Lab Results  Component Value Date   HGBA1C 5.2 05/02/2023   Assessment & Plan    1.Paroxysmal atrial fibrillation: -Patient is currently rate controlled with amiodarone 200 mg daily.  Heart rate today was controlled at 67 bpm Reports episodes of fast heartbeat, lasting up to an hour, often during dialysis or after prolonged sitting. No associated symptoms of chest pain or shortness of breath. -Continue Eliquis 5 mg twice daily  2.Nonobstructive CAD: -She underwent coronary calcium scoring that resulted in score of 3824 with calcium noted in the LAD, left circumflex, and RCA.  Lexiscan Myoview was  completed to rule out ischemia that was normal -Today patient reports no chest pain or angina since previous follow-up.  3.ESRD on HD: -Currently Monday Wednesday and Friday dialysis  4.Hyperlipidemia: -Patient's last LDL was 94 -Continue Lipitor 40 mg daily  5.History of cardiac tamponade: -s/p pericardial window performed 2020 -No residual pericardial effusion on 2D echo in 2022  6.Right breast cancer -Diagnosed 04/2023 treated with radiation therapy and antiestrogen therapy.  Disposition: Follow-up with Armanda Magic, MD or APP in 12 months    Signed, Napoleon Form, Leodis Rains, NP 10/14/2023, 12:09 PM [facility]

## 2023-10-15 ENCOUNTER — Encounter: Payer: Self-pay | Admitting: Nurse Practitioner

## 2023-10-15 ENCOUNTER — Ambulatory Visit: Payer: Medicare Other | Attending: Nurse Practitioner | Admitting: Nurse Practitioner

## 2023-10-15 VITALS — BP 118/62 | HR 67 | Ht 67.0 in | Wt 235.8 lb

## 2023-10-15 DIAGNOSIS — Z170 Estrogen receptor positive status [ER+]: Secondary | ICD-10-CM | POA: Insufficient documentation

## 2023-10-15 DIAGNOSIS — Z992 Dependence on renal dialysis: Secondary | ICD-10-CM | POA: Diagnosis not present

## 2023-10-15 DIAGNOSIS — E785 Hyperlipidemia, unspecified: Secondary | ICD-10-CM | POA: Insufficient documentation

## 2023-10-15 DIAGNOSIS — I314 Cardiac tamponade: Secondary | ICD-10-CM | POA: Insufficient documentation

## 2023-10-15 DIAGNOSIS — I2510 Atherosclerotic heart disease of native coronary artery without angina pectoris: Secondary | ICD-10-CM | POA: Insufficient documentation

## 2023-10-15 DIAGNOSIS — C50811 Malignant neoplasm of overlapping sites of right female breast: Secondary | ICD-10-CM | POA: Diagnosis not present

## 2023-10-15 DIAGNOSIS — I480 Paroxysmal atrial fibrillation: Secondary | ICD-10-CM | POA: Insufficient documentation

## 2023-10-15 DIAGNOSIS — D631 Anemia in chronic kidney disease: Secondary | ICD-10-CM | POA: Diagnosis not present

## 2023-10-15 DIAGNOSIS — N2581 Secondary hyperparathyroidism of renal origin: Secondary | ICD-10-CM | POA: Diagnosis not present

## 2023-10-15 DIAGNOSIS — N186 End stage renal disease: Secondary | ICD-10-CM | POA: Diagnosis not present

## 2023-10-15 NOTE — Patient Instructions (Signed)
Medication Instructions:  Your physician recommends that you continue on your current medications as directed. Please refer to the Current Medication list given to you today. *If you need a refill on your cardiac medications before your next appointment, please call your pharmacy*   Lab Work: NONE ORDERED If you have labs (blood work) drawn today and your tests are completely normal, you will receive your results only by: MyChart Message (if you have MyChart) OR A paper copy in the mail If you have any lab test that is abnormal or we need to change your treatment, we will call you to review the results.   Testing/Procedures: NONE ORDERED   Follow-Up: At [facility], you and your health needs are our priority.  As part of our continuing mission to provide you with exceptional heart care, we have created designated Provider Care Teams.  These Care Teams include your primary Cardiologist (physician) and Advanced Practice Providers (APPs -  Physician Assistants and Nurse Practitioners) who all work together to provide you with the care you need, when you need it.  We recommend signing up for the patient portal called "MyChart".  Sign up information is provided on this After Visit Summary.  MyChart is used to connect with patients for Virtual Visits (Telemedicine).  Patients are able to view lab/test results, encounter notes, upcoming appointments, etc.  Non-urgent messages can be sent to your provider as well.   To learn more about what you can do with MyChart, go to [email or web address].    Your next appointment:   12 month(s)  Provider:   Armanda Magic, MD     Other Instructions

## 2023-10-17 DIAGNOSIS — D631 Anemia in chronic kidney disease: Secondary | ICD-10-CM | POA: Diagnosis not present

## 2023-10-17 DIAGNOSIS — N2581 Secondary hyperparathyroidism of renal origin: Secondary | ICD-10-CM | POA: Diagnosis not present

## 2023-10-17 DIAGNOSIS — N186 End stage renal disease: Secondary | ICD-10-CM | POA: Diagnosis not present

## 2023-10-17 DIAGNOSIS — Z992 Dependence on renal dialysis: Secondary | ICD-10-CM | POA: Diagnosis not present

## 2023-10-20 DIAGNOSIS — N2581 Secondary hyperparathyroidism of renal origin: Secondary | ICD-10-CM | POA: Diagnosis not present

## 2023-10-20 DIAGNOSIS — N186 End stage renal disease: Secondary | ICD-10-CM | POA: Diagnosis not present

## 2023-10-20 DIAGNOSIS — D631 Anemia in chronic kidney disease: Secondary | ICD-10-CM | POA: Diagnosis not present

## 2023-10-20 DIAGNOSIS — Z992 Dependence on renal dialysis: Secondary | ICD-10-CM | POA: Diagnosis not present

## 2023-10-22 ENCOUNTER — Other Ambulatory Visit: Payer: Self-pay | Admitting: *Deleted

## 2023-10-22 ENCOUNTER — Ambulatory Visit (HOSPITAL_COMMUNITY)
Admission: RE | Admit: 2023-10-22 | Discharge: 2023-10-22 | Disposition: A | Discharge status: Home / Self Care | Payer: Medicare Other | Source: Ambulatory Visit | Attending: Vascular Surgery | Admitting: Vascular Surgery

## 2023-10-22 ENCOUNTER — Ambulatory Visit (INDEPENDENT_AMBULATORY_CARE_PROVIDER_SITE_OTHER): Payer: Medicare Other | Admitting: Physician Assistant

## 2023-10-22 VITALS — BP 113/73 | HR 60 | Temp 98.4°F | Wt 235.6 lb

## 2023-10-22 DIAGNOSIS — D631 Anemia in chronic kidney disease: Secondary | ICD-10-CM | POA: Diagnosis not present

## 2023-10-22 DIAGNOSIS — N186 End stage renal disease: Secondary | ICD-10-CM

## 2023-10-22 DIAGNOSIS — N2581 Secondary hyperparathyroidism of renal origin: Secondary | ICD-10-CM | POA: Diagnosis not present

## 2023-10-22 DIAGNOSIS — Z992 Dependence on renal dialysis: Secondary | ICD-10-CM | POA: Diagnosis not present

## 2023-10-23 NOTE — Progress Notes (Signed)
POST OPERATIVE OFFICE NOTE    CC:  F/u for surgery  HPI:  This is a 67 y.o. female who is s/p revision of the left radiocephalic fistula with plication due to ulcerated areas.  This was performed by Dr. Karin Lieu on 09/23/2023.  She has been dialyzing from her radiocephalic fistula closer to the elbow while her incision is healing.  She denies any trouble dialyzing in this area.  She does have a small scabbed area in this area however.  She denies any bleeding.  Allergies  Allergen Reactions   Iodine Shortness Of Breath   Shellfish Allergy Shortness Of Breath    Breathing Problems   Amlodipine Swelling   Felodipine Swelling    Headache   Lisinopril Cough   Chlorhexidine Itching    CHG wipes    Current Outpatient Medications  Medication Sig Dispense Refill   acetaminophen (TYLENOL) 500 MG tablet Take 2 tablets (1,000 mg total) by mouth every 8 (eight) hours as needed for mild pain. (Patient taking differently: Take 500 mg by mouth every 8 (eight) hours as needed for mild pain (pain score 1-3).)     acetaminophen (TYLENOL) 650 MG CR tablet Take 650 mg by mouth every 8 (eight) hours as needed for pain.     amiodarone (PACERONE) 200 MG tablet Take 1 tablet (200 mg total) by mouth daily. 90 tablet 3   anastrozole (ARIMIDEX) 1 MG tablet Take 1 tablet (1 mg total) by mouth daily. 90 tablet 3   atorvastatin (LIPITOR) 40 MG tablet Take 1 tablet (40 mg total) by mouth daily. 90 tablet 3   B Complex-C-Folic Acid (DIALYVITE TABLET) TABS Take 1 tablet by mouth at bedtime.     bisacodyl (DULCOLAX) 5 MG EC tablet Take 1 tablet (5 mg total) by mouth daily as needed for moderate constipation.     ciprofloxacin-dexamethasone (CIPRODEX) OTIC suspension Place 4 drops into both ears 2 (two) times daily as needed (ear wax build up).     colchicine 0.6 MG tablet TAKE 0.5 TABLETS (0.3 MG TOTAL) BY MOUTH EVERY MONDAY, WEDNESDAY, AND FRIDAY. 45 tablet 3   ELIQUIS 5 MG TABS tablet Take 1 tablet (5 mg total) by  mouth 2 (two) times daily. 180 tablet 3   ethyl chloride spray Apply 1 application  topically every Monday, Wednesday, and Friday with hemodialysis.     fluticasone (FLONASE) 50 MCG/ACT nasal spray Place 2 sprays into both nostrils daily. (Patient taking differently: Place 2 sprays into both nostrils daily as needed for allergies.) 9.9 mL 0   gabapentin (NEURONTIN) 100 MG capsule Take 100 mg by mouth at bedtime.  3   loratadine (CLARITIN) 10 MG tablet Take 1 tablet (10 mg total) by mouth daily. (Patient taking differently: Take 10 mg by mouth daily as needed for allergies.) 30 tablet 0   midodrine (PROAMATINE) 10 MG tablet Take 1 tablet (10 mg total) by mouth 3 (three) times daily with meals.     oxyCODONE-acetaminophen (PERCOCET) 5-325 MG tablet Take 1 tablet by mouth every 6 (six) hours as needed for severe pain (pain score 7-10) or moderate pain (pain score 4-6). 15 tablet 0   sucroferric oxyhydroxide (VELPHORO) 500 MG chewable tablet Chew 2 tablets (1,000 mg total) by mouth 3 (three) times daily with meals. (Patient taking differently: Chew 500-1,000 mg by mouth See admin instructions. Take 1000 mg with meals and 500 mg with snacks) 180 tablet 0   No current facility-administered medications for this visit.     ROS:  See HPI  Physical Exam:  Vitals:   10/22/23 1244  BP: 113/73  Pulse: 60  Temp: 98.4 F (36.9 C)  TempSrc: Temporal  SpO2: 99%  Weight: 235 lb 9.6 oz (106.9 kg)    Incision: Incision well-healed; palpable thrill throughout the forearm Extremities:  grip strength symmetrical  Assessment/Plan:  This is a 67 y.o. female who is s/p: Left radiocephalic fistula revision with plication  Patent left radiocephalic fistula with palpable thrill throughout the forearm.  Incision is well-healed.  All staples were removed today.  She will continue to avoid dialysis from the area of her incision for another 2 weeks.  I have also asked her to avoid dialysis from new area of scabbing  just above her incision.  She can follow-up on an as-needed basis.   Emilie Rutter, PA-C [facility] [phone]  Clinic MD:  Randie Heinz

## 2023-10-24 DIAGNOSIS — N2581 Secondary hyperparathyroidism of renal origin: Secondary | ICD-10-CM | POA: Diagnosis not present

## 2023-10-24 DIAGNOSIS — N186 End stage renal disease: Secondary | ICD-10-CM | POA: Diagnosis not present

## 2023-10-24 DIAGNOSIS — Z992 Dependence on renal dialysis: Secondary | ICD-10-CM | POA: Diagnosis not present

## 2023-10-24 DIAGNOSIS — D631 Anemia in chronic kidney disease: Secondary | ICD-10-CM | POA: Diagnosis not present

## 2023-10-26 ENCOUNTER — Other Ambulatory Visit: Payer: Self-pay | Admitting: Nurse Practitioner

## 2023-10-26 DIAGNOSIS — Z992 Dependence on renal dialysis: Secondary | ICD-10-CM | POA: Diagnosis not present

## 2023-10-26 DIAGNOSIS — D631 Anemia in chronic kidney disease: Secondary | ICD-10-CM | POA: Diagnosis not present

## 2023-10-26 DIAGNOSIS — N2581 Secondary hyperparathyroidism of renal origin: Secondary | ICD-10-CM | POA: Diagnosis not present

## 2023-10-26 DIAGNOSIS — N186 End stage renal disease: Secondary | ICD-10-CM | POA: Diagnosis not present

## 2023-10-27 ENCOUNTER — Ambulatory Visit: Payer: Medicare Other

## 2023-10-27 DIAGNOSIS — M2141 Flat foot [pes planus] (acquired), right foot: Secondary | ICD-10-CM

## 2023-10-27 DIAGNOSIS — M2041 Other hammer toe(s) (acquired), right foot: Secondary | ICD-10-CM

## 2023-10-27 DIAGNOSIS — L84 Corns and callosities: Secondary | ICD-10-CM

## 2023-10-27 DIAGNOSIS — E0822 Diabetes mellitus due to underlying condition with diabetic chronic kidney disease: Secondary | ICD-10-CM

## 2023-10-27 NOTE — Progress Notes (Signed)
Patient presents to the office today for diabetic shoe and insole measuring.  Patient was measured with brannock device to determine size and width for 1 pair of extra depth shoes and foam casted for 3 pair of insoles.   Documentation of medical necessity will be sent to patient's treating diabetic doctor to verify and sign.   Patient's diabetic provider: Rinka [name]   Shoes and insoles will be ordered at that time and patient will be notified for an appointment for fitting when they arrive.   Shoe size (per patient): 9 Brannock measurement: 9 Patient shoe selection- Shoe choice:   [id number] / [id number] Shoe size ordered: 9WD

## 2023-10-28 DIAGNOSIS — Z992 Dependence on renal dialysis: Secondary | ICD-10-CM | POA: Diagnosis not present

## 2023-10-28 DIAGNOSIS — D631 Anemia in chronic kidney disease: Secondary | ICD-10-CM | POA: Diagnosis not present

## 2023-10-28 DIAGNOSIS — N2581 Secondary hyperparathyroidism of renal origin: Secondary | ICD-10-CM | POA: Diagnosis not present

## 2023-10-28 DIAGNOSIS — N186 End stage renal disease: Secondary | ICD-10-CM | POA: Diagnosis not present

## 2023-10-31 DIAGNOSIS — N2581 Secondary hyperparathyroidism of renal origin: Secondary | ICD-10-CM | POA: Diagnosis not present

## 2023-10-31 DIAGNOSIS — D631 Anemia in chronic kidney disease: Secondary | ICD-10-CM | POA: Diagnosis not present

## 2023-10-31 DIAGNOSIS — Z992 Dependence on renal dialysis: Secondary | ICD-10-CM | POA: Diagnosis not present

## 2023-10-31 DIAGNOSIS — N186 End stage renal disease: Secondary | ICD-10-CM | POA: Diagnosis not present

## 2023-11-02 DIAGNOSIS — E1122 Type 2 diabetes mellitus with diabetic chronic kidney disease: Secondary | ICD-10-CM | POA: Diagnosis not present

## 2023-11-02 DIAGNOSIS — N186 End stage renal disease: Secondary | ICD-10-CM | POA: Diagnosis not present

## 2023-11-02 DIAGNOSIS — Z992 Dependence on renal dialysis: Secondary | ICD-10-CM | POA: Diagnosis not present

## 2023-11-03 DIAGNOSIS — Z992 Dependence on renal dialysis: Secondary | ICD-10-CM | POA: Diagnosis not present

## 2023-11-03 DIAGNOSIS — D631 Anemia in chronic kidney disease: Secondary | ICD-10-CM | POA: Diagnosis not present

## 2023-11-03 DIAGNOSIS — N2581 Secondary hyperparathyroidism of renal origin: Secondary | ICD-10-CM | POA: Diagnosis not present

## 2023-11-03 DIAGNOSIS — N186 End stage renal disease: Secondary | ICD-10-CM | POA: Diagnosis not present

## 2023-11-05 ENCOUNTER — Other Ambulatory Visit: Payer: Self-pay

## 2023-11-05 DIAGNOSIS — D631 Anemia in chronic kidney disease: Secondary | ICD-10-CM | POA: Diagnosis not present

## 2023-11-05 DIAGNOSIS — N186 End stage renal disease: Secondary | ICD-10-CM | POA: Diagnosis not present

## 2023-11-05 DIAGNOSIS — C50811 Malignant neoplasm of overlapping sites of right female breast: Secondary | ICD-10-CM

## 2023-11-05 DIAGNOSIS — N2581 Secondary hyperparathyroidism of renal origin: Secondary | ICD-10-CM | POA: Diagnosis not present

## 2023-11-05 DIAGNOSIS — Z992 Dependence on renal dialysis: Secondary | ICD-10-CM | POA: Diagnosis not present

## 2023-11-05 DIAGNOSIS — Z170 Estrogen receptor positive status [ER+]: Secondary | ICD-10-CM

## 2023-11-06 ENCOUNTER — Encounter: Payer: Self-pay | Admitting: Adult Health

## 2023-11-06 ENCOUNTER — Inpatient Hospital Stay: Payer: Medicare Other | Attending: Hematology and Oncology | Admitting: Adult Health

## 2023-11-06 ENCOUNTER — Encounter: Payer: Medicare Other | Admitting: Adult Health

## 2023-11-06 ENCOUNTER — Inpatient Hospital Stay: Payer: Medicare Other

## 2023-11-06 VITALS — BP 126/54 | HR 96 | Temp 98.1°F | Resp 16 | Wt 236.0 lb

## 2023-11-06 DIAGNOSIS — Z79811 Long term (current) use of aromatase inhibitors: Secondary | ICD-10-CM | POA: Diagnosis not present

## 2023-11-06 DIAGNOSIS — Z801 Family history of malignant neoplasm of trachea, bronchus and lung: Secondary | ICD-10-CM | POA: Diagnosis not present

## 2023-11-06 DIAGNOSIS — Z170 Estrogen receptor positive status [ER+]: Secondary | ICD-10-CM

## 2023-11-06 DIAGNOSIS — C50811 Malignant neoplasm of overlapping sites of right female breast: Secondary | ICD-10-CM | POA: Insufficient documentation

## 2023-11-06 DIAGNOSIS — Z923 Personal history of irradiation: Secondary | ICD-10-CM | POA: Insufficient documentation

## 2023-11-06 DIAGNOSIS — Z1722 Progesterone receptor negative status: Secondary | ICD-10-CM | POA: Diagnosis not present

## 2023-11-06 LAB — CBC WITH DIFFERENTIAL (CANCER CENTER ONLY)
Abs Immature Granulocytes: 0.03 10*3/uL (ref 0.00–0.07)
Basophils Absolute: 0 10*3/uL (ref 0.0–0.1)
Basophils Relative: 1 %
Eosinophils Absolute: 0.1 10*3/uL (ref 0.0–0.5)
Eosinophils Relative: 2 %
HCT: 33.6 % — ABNORMAL LOW (ref 36.0–46.0)
Hemoglobin: 10.7 g/dL — ABNORMAL LOW (ref 12.0–15.0)
Immature Granulocytes: 0 %
Lymphocytes Relative: 18 %
Lymphs Abs: 1.4 10*3/uL (ref 0.7–4.0)
MCH: 31.8 pg (ref 26.0–34.0)
MCHC: 31.8 g/dL (ref 30.0–36.0)
MCV: 99.7 fL (ref 80.0–100.0)
Monocytes Absolute: 0.9 10*3/uL (ref 0.1–1.0)
Monocytes Relative: 11 %
Neutro Abs: 5.4 10*3/uL (ref 1.7–7.7)
Neutrophils Relative %: 68 %
Platelet Count: 167 10*3/uL (ref 150–400)
RBC: 3.37 MIL/uL — ABNORMAL LOW (ref 3.87–5.11)
RDW: 15.8 % — ABNORMAL HIGH (ref 11.5–15.5)
WBC Count: 7.8 10*3/uL (ref 4.0–10.5)
nRBC: 0 % (ref 0.0–0.2)

## 2023-11-06 LAB — CMP (CANCER CENTER ONLY)
ALT: 27 U/L (ref 0–44)
AST: 23 U/L (ref 15–41)
Albumin: 4.1 g/dL (ref 3.5–5.0)
Alkaline Phosphatase: 223 U/L — ABNORMAL HIGH (ref 38–126)
Anion gap: 10 (ref 5–15)
BUN: 40 mg/dL — ABNORMAL HIGH (ref 8–23)
CO2: 36 mmol/L — ABNORMAL HIGH (ref 22–32)
Calcium: 9.4 mg/dL (ref 8.9–10.3)
Chloride: 97 mmol/L — ABNORMAL LOW (ref 98–111)
Creatinine: 8.39 mg/dL (ref 0.44–1.00)
GFR, Estimated: 5 mL/min — ABNORMAL LOW (ref >60)
Glucose, Bld: 82 mg/dL (ref 70–99)
Potassium: 4.4 mmol/L (ref 3.5–5.1)
Sodium: 143 mmol/L (ref 135–145)
Total Bilirubin: 0.5 mg/dL (ref <1.2)
Total Protein: 7.1 g/dL (ref 6.5–8.1)

## 2023-11-06 NOTE — Progress Notes (Signed)
SURVIVORSHIP VISIT:  BRIEF ONCOLOGIC HISTORY:  Oncology History  Malignant neoplasm of overlapping sites of right female breast (HCC)  03/21/2023 Mammogram   Diagnostic mammogram showed suspicious mass in the right breast at 6:00 measuring 1.3 cm, at the palpable site of concern in the left axilla there benign sebaceous or epidermal inclusion cyst.  No mammographic evidence of malignancy in the left breast.   04/04/2023 Pathology Results   Right breast needle core biopsy showed invasive ductal carcinoma overall grade 1, prognostic showed ER 100% positive strong staining PR negative HER2 0 and Ki-67 of 10%   04/24/2023 Initial Diagnosis   Malignant neoplasm of overlapping sites of right female breast (HCC)   04/24/2023 Cancer Staging   Staging form: Breast, AJCC 8th Edition - Clinical stage from 04/24/2023: Stage IA (cT1c, cN0, cM0, G1, ER+, PR-, HER2-) - Signed by Ronny Bacon, PA-C on 04/24/2023 Stage prefix: Initial diagnosis Method of lymph node assessment: Clinical Histologic grading system: 3 grade system   06/19/2023 - 07/17/2023 Radiation Therapy   Plan Name: Breast_R Site: Breast, Right Technique: 3D Mode: Photon Dose Per Fraction: 2.66 Gy Prescribed Dose (Delivered / Prescribed): 42.56 Gy / 42.56 Gy Prescribed Fxs (Delivered / Prescribed): 16 / 16   Plan Name: Breast_R_Bst Site: Breast, Right Technique: 3D Mode: Photon Dose Per Fraction: 2 Gy Prescribed Dose (Delivered / Prescribed): 8 Gy / 8 Gy Prescribed Fxs (Delivered / Prescribed): 4 / 4   08/2023 -  Anti-estrogen oral therapy   Anastrozole     INTERVAL HISTORY:  Ms. Shuford to review her survivorship care plan detailing her treatment course for breast cancer, as well as monitoring long-term side effects of that treatment, education regarding health maintenance, screening, and overall wellness and health promotion.     Overall, Ms. Spray reports feeling quite well.  She has been taking anastrozole without  any reported issues. The patient has experienced side effects from surgery, including scarring and chronic pain, and acknowledges the possibility of lymphedema. She has also undergone radiation therapy, with noted side effects of tiredness, skin discoloration, and breast swelling. However, she reports that the skin color is starting to return to normal.  She has expressed a willingness to make dietary changes and increase physical activity. She has also had a recent bone density test, which showed good results. The patient is due for a mammogram in April.  REVIEW OF SYSTEMS:  Review of Systems  Constitutional:  Negative for appetite change, chills, fatigue, fever and unexpected weight change.  HENT:   Negative for hearing loss, lump/mass and trouble swallowing.   Eyes:  Negative for eye problems and icterus.  Respiratory:  Negative for chest tightness, cough and shortness of breath.   Cardiovascular:  Negative for chest pain, leg swelling and palpitations.  Gastrointestinal:  Negative for abdominal distention, abdominal pain, constipation, diarrhea, nausea and vomiting.  Endocrine: Negative for hot flashes.  Genitourinary:  Negative for difficulty urinating.   Musculoskeletal:  Negative for arthralgias.  Skin:  Negative for itching and rash.  Neurological:  Negative for dizziness, extremity weakness, headaches and numbness.  Hematological:  Negative for adenopathy. Does not bruise/bleed easily.  Psychiatric/Behavioral:  Negative for depression. The patient is not nervous/anxious.    Breast: Denies any new nodularity, masses, tenderness, nipple changes, or nipple discharge.       PAST MEDICAL/SURGICAL HISTORY:  Past Medical History:  Diagnosis Date   Agatston coronary artery calcium score greater than 400    Ca score 3824 on CT 08/2021  Anemia    Arthritis    Asthma    Breast cancer (HCC) 04/04/2023   Complication of anesthesia    difficulty with getting oxygen saturation up-  "patient was not aware"  Bottom of feet burning in PACU   Constipation    because of Iron   Diabetes mellitus    not on meds   DVT (deep venous thrombosis) (HCC) 2019   post hip replacement - right   Dyslipidemia    Epistaxis 11/05/2012   ESRD (end stage renal disease) on dialysis [facility])    "MWF; Rudene Anda" (09/15/2018)- started 02/13/2017   History of blood transfusion    HTN (hypertension)    pt denies this. She states her "BP runs low now"   Hyperparathyroidism due to renal insufficiency (HCC)    Hypotension    Neuromuscular disorder (HCC)    Neuropathy    Obesity    s/p panniculectomy   Paroxysmal A-fib (HCC)    a. chronic coumadin;  b. 12/2009 Echo: EF 60-65%, Gr 1 DD.   PCOS (polycystic ovarian syndrome)    Pericarditis 08/2019   pericarditis with pericardial effusion, s/p right VATS/pericardial window 08/31/19   Pneumonia    Seasonal allergies    Sleep apnea    a. not using CPAP, last study  >8 yrs   Vitamin D deficiency    Past Surgical History:  Procedure Laterality Date   ABDOMINAL HYSTERECTOMY     with panniculectomy   AV FISTULA PLACEMENT  09/02/2012   Procedure: ARTERIOVENOUS (AV) FISTULA CREATION;  Surgeon: Sherren Kerns, MD;  Location[facility] OR;  Service: Vascular;  Laterality: Left;  Creation of Left Radial-Cephalic Fistula    BREAST BIOPSY Right 04/04/2023   [place] RT BREAST BX W LOC DEV 1ST LESION IMG BX SPEC US GUIDE 04/04/2023 [facility] MAMMOGRAPHY   BREAST BIOPSY  05/06/2023   [place] RT RADIOACTIVE SEED LOC 05/06/2023 [facility] MAMMOGRAPHY   BREAST LUMPECTOMY WITH RADIOACTIVE SEED LOCALIZATION Right 05/08/2023   Procedure: RIGHT BREAST LUMPECTOMY WITH RADIOACTIVE SEED LOCALIZATION;  Surgeon: Griselda Miner, MD;  Location: [facility] OR;  Service: General;  Laterality: Right;   COLONOSCOPY     COLONOSCOPY W/ BIOPSIES AND POLYPECTOMY     DILATION AND CURETTAGE OF UTERUS     FISTULA SUPERFICIALIZATION Left 09/23/2023   Procedure: PLICATION OF LEFT ARM FISTULA;  Surgeon: Victorino Sparrow, MD;  Location: [facility] OR;  Service: Vascular;  Laterality: Left;   KNEE ARTHROSCOPY Right    PANNICULECTOMY     REVISON OF ARTERIOVENOUS FISTULA Left 04/27/2014   Procedure: REVISON OF LEFT RADIAL-CEPHALIC ARTERIOVENOUS FISTULA;  Surgeon: Pryor Ochoa, MD;  Location[facility] OR;  Service: Vascular;  Laterality: Left;   REVISON OF ARTERIOVENOUS FISTULA Left 01/13/2020   Procedure: REVISON OF ARTERIOVENOUS FISTULA;  Surgeon: Cephus Shelling, MD;  Location: [facility] OR;  Service: Vascular;  Laterality: Left;   REVISON OF ARTERIOVENOUS FISTULA Left 08/11/2023   Procedure: PLICATION OF LEFT ARM ARTERIOVENOUS FISTULA;  Surgeon: Daria Pastures, MD;  Location[facility];  Service: Vascular;  Laterality: Left;   TOTAL HIP ARTHROPLASTY Right 09/15/2018   TOTAL HIP ARTHROPLASTY Right 09/15/2018   Procedure: RIGHT TOTAL HIP ARTHROPLASTY ANTERIOR APPROACH;  Surgeon: Tarry Kos, MD;  Location: [facility] OR;  Service: Orthopedics;  Laterality: Right;   TOTAL HIP ARTHROPLASTY Left 11/23/2020   Procedure: LEFT TOTAL HIP ARTHROPLASTY ANTERIOR APPROACH;  Surgeon: Tarry Kos, MD;  Location: [facility] OR;  Service: Orthopedics;  Laterality: Left;  NEEDS RNFA  PLEASE   UVULOPLASTY     VIDEO ASSISTED THORACOSCOPY (VATS)/WEDGE RESECTION Right 08/31/2019   Procedure: VIDEO ASSISTED THORACOSCOPY/ DRAINAGE OF PERICARDIAL EFFUSION/ PERICARDIAL WINDOW/ ABORTED PERICARDIOCENTESIS ;  Surgeon: Corliss Skains, MD;  Location: [facility] OR;  Service: Thoracic;  Laterality: Right;     ALLERGIES:  Allergies  Allergen Reactions   Iodine Shortness Of Breath   Shellfish Allergy Shortness Of Breath    Breathing Problems   Amlodipine Swelling   Felodipine Swelling    Headache   Lisinopril Cough   Chlorhexidine Itching    CHG wipes     CURRENT MEDICATIONS:  Outpatient Encounter Medications as of 11/06/2023  Medication Sig Note   acetaminophen (TYLENOL) 500 MG tablet Take 2 tablets (1,000 mg total) by mouth every 8 (eight) hours as  needed for mild pain. (Patient taking differently: Take 500 mg by mouth every 8 (eight) hours as needed for mild pain (pain score 1-3).) 09/19/2023: Pt alternates taking extra and arthritis strength tylenol    acetaminophen (TYLENOL) 650 MG CR tablet Take 650 mg by mouth every 8 (eight) hours as needed for pain. 09/19/2023: Pt alternates taking extra and arthritis strength tylenol    amiodarone (PACERONE) 200 MG tablet Take 1 tablet (200 mg total) by mouth daily.    anastrozole (ARIMIDEX) 1 MG tablet Take 1 tablet (1 mg total) by mouth daily.    atorvastatin (LIPITOR) 40 MG tablet TAKE 1 TABLET BY MOUTH EVERY DAY    B Complex-C-Folic Acid (DIALYVITE TABLET) TABS Take 1 tablet by mouth at bedtime.    bisacodyl (DULCOLAX) 5 MG EC tablet Take 1 tablet (5 mg total) by mouth daily as needed for moderate constipation.    ciprofloxacin-dexamethasone (CIPRODEX) OTIC suspension Place 4 drops into both ears 2 (two) times daily as needed (ear wax build up).    colchicine 0.6 MG tablet TAKE 0.5 TABLETS (0.3 MG TOTAL) BY MOUTH EVERY MONDAY, WEDNESDAY, AND FRIDAY.    ELIQUIS 5 MG TABS tablet Take 1 tablet (5 mg total) by mouth 2 (two) times daily.    ethyl chloride spray Apply 1 application  topically every Monday, Wednesday, and Friday with hemodialysis.    fluticasone (FLONASE) 50 MCG/ACT nasal spray Place 2 sprays into both nostrils daily. (Patient taking differently: Place 2 sprays into both nostrils daily as needed for allergies.)    gabapentin (NEURONTIN) 100 MG capsule Take 100 mg by mouth at bedtime.    loratadine (CLARITIN) 10 MG tablet Take 1 tablet (10 mg total) by mouth daily. (Patient taking differently: Take 10 mg by mouth daily as needed for allergies.)    midodrine (PROAMATINE) 10 MG tablet Take 1 tablet (10 mg total) by mouth 3 (three) times daily with meals.    oxyCODONE-acetaminophen (PERCOCET) 5-325 MG tablet Take 1 tablet by mouth every 6 (six) hours as needed for severe pain (pain score 7-10)  or moderate pain (pain score 4-6).    sucroferric oxyhydroxide (VELPHORO) 500 MG chewable tablet Chew 2 tablets (1,000 mg total) by mouth 3 (three) times daily with meals. (Patient taking differently: Chew 500-1,000 mg by mouth See admin instructions. Take 1000 mg with meals and 500 mg with snacks)    No facility-administered encounter medications on file as of 11/06/2023.     ONCOLOGIC FAMILY HISTORY:  Family History  Problem Relation Age of Onset   Lung cancer Father        died @ 39   Hypertension Mother    Diabetes Brother    Kidney  disease Brother      SOCIAL HISTORY:  Social History   Socioeconomic History   Marital status: Single    Spouse name: Not on file   Number of children: 0   Years of education: Not on file   Highest education level: Not on file  Occupational History   Occupation: CORRESPONDENT ADMIN.    Employer: [employer]  Tobacco Use   Smoking status: Never   Smokeless tobacco: Never  Vaping Use   Vaping status: Never Used  Substance and Sexual Activity   Alcohol use: No    Alcohol/week: 0.0 standard drinks of alcohol   Drug use: No   Sexual activity: Not on file    Comment: Hysterectomy  Other Topics Concern   Not on file  Social History Narrative   Lives in [place] alone. 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   Dialysis M-W-F    Social Determinants of Health   Financial Resource Strain: Not on file  Food Insecurity: No Food Insecurity (09/23/2023)   Hunger Vital Sign    Worried About Running Out of Food in the Last Year: Never true    Ran Out of Food in the Last Year: Never true  Transportation Needs: No Transportation Needs (09/23/2023)   PRAPARE - Administrator, Civil Service (Medical): No    Lack of Transportation (Non-Medical): No  Physical Activity: Not on file  Stress: Not on file  Social Connections: Not on file  Intimate Partner Violence: Not At Risk (09/23/2023)   Humiliation,  Afraid, Rape, and Kick questionnaire    Fear of Current or Ex-Partner: No    Emotionally Abused: No    Physically Abused: No    Sexually Abused: No     OBSERVATIONS/OBJECTIVE:  BP (!) 126/54 (BP Location: Right Arm, Patient Position: Sitting)   Pulse 96   Temp 98.1 F (36.7 C) (Temporal)   Resp 16   Wt 236 lb (107 kg)   SpO2 98%   BMI 36.96 kg/m  GENERAL: Patient is a well appearing female in no acute distress HEENT:  Sclerae anicteric.  Oropharynx clear and moist. No ulcerations or evidence of oropharyngeal candidiasis. Neck is supple.  NODES:  No cervical, supraclavicular, or axillary lymphadenopathy palpated.  BREAST EXAM: Right breast status postlumpectomy and radiation no sign of local recurrence LUNGS:  Clear to auscultation bilaterally.  No wheezes or rhonchi. HEART:  Regular rate and rhythm. No murmur appreciated. ABDOMEN:  Soft, nontender.  Positive, normoactive bowel sounds. No organomegaly palpated. MSK:  No focal spinal tenderness to palpation. Full range of motion bilaterally in the upper extremities. EXTREMITIES:  No peripheral edema.   SKIN:  Clear with no obvious rashes or skin changes. No nail dyscrasia. NEURO:  Nonfocal. Well oriented.  Appropriate affect.   LABORATORY DATA:  None for this visit.  DIAGNOSTIC IMAGING:  None for this visit.      ASSESSMENT AND PLAN:  Brenda Arnold is a pleasant 67 y.o. female with Stage IA right breast invasive ductal carcinoma, ER+/PR-/HER2-, diagnosed in 04/2023, treated with lumpectomy, adjuvant radiation therapy, and anti-estrogen therapy with Anastrozole beginning in 08/2023.  She presents to the Survivorship Clinic for our initial meeting and routine follow-up post-completion of treatment for breast cancer.    1. Stage IA right breast cancer:  Brenda Arnold is continuing to recover from definitive treatment for breast cancer. She will follow-up with her medical oncologist, Dr. Al Pimple in 6 months with history and physical  exam per  surveillance protocol.  She will continue her anti-estrogen therapy with anastrozole. Thus far, she is tolerating the Anastrozole well, with minimal side effects. Her mammogram is due 03/2024; orders placed today.   Today, a comprehensive survivorship care plan and treatment summary was reviewed with the patient today detailing her breast cancer diagnosis, treatment course, potential late/long-term effects of treatment, appropriate follow-up care with recommendations for the future, and patient education resources.  A copy of this summary, along with a letter will be sent to the patient's primary care provider via mail/fax/In Basket message after today's visit.    2. Bone health:  Given Brenda Arnold's age/history of breast cancer and her current treatment regimen including anti-estrogen therapy with Anastrozole, she is at risk for bone demineralization.  Her last DEXA scan was 03/2023 and was normal.  She is recommended to repeat bone density testing every 2 years while taking anastrozole therapy.  She was given education on specific activities to promote bone health.  3. Cancer screening:  Due to Brenda Arnold's history and her age, she should receive screening for skin cancers, colon cancer, and gynecologic cancers.  The information and recommendations are listed on the patient's comprehensive care plan/treatment summary and were reviewed in detail with the patient.    4. Health maintenance and wellness promotion: Brenda Arnold was encouraged to consume 5-7 servings of fruits and vegetables per day. We reviewed the "Nutrition Rainbow" handout.  She was also encouraged to engage in moderate to vigorous exercise for 30 minutes per day most days of the week.  She was instructed to limit her alcohol consumption and continue to abstain from tobacco use.     5. Support services/counseling: It is not uncommon for this period of the patient's cancer care trajectory to be one of many emotions and stressors.   She  was given information regarding our available services and encouraged to contact me with any questions or for help enrolling in any of our support group/programs.    Follow up instructions:    -Return to cancer center in 6 months for f/u with Dr. Al Pimple  -Mammogram due in 04/2024 -She is welcome to return back to the Survivorship Clinic at any time; no additional follow-up needed at this time.  -Consider referral back to survivorship as a long-term survivor for continued surveillance  The patient was provided an opportunity to ask questions and all were answered. The patient agreed with the plan and demonstrated an understanding of the instructions.   Total encounter time:40 minutes*in face-to-face visit time, chart review, lab review, care coordination, order entry, and documentation of the encounter time.    Lillard Anes, NP 11/06/23 1:51 PM Medical Oncology and Hematology [facility] [address] [place], [place] [address] Tel. [phone]    Fax. [phone]  *Total Encounter Time as defined by the Centers for Medicare and Medicaid Services includes, in addition to the face-to-face time of a patient visit (documented in the note above) non-face-to-face time: obtaining and reviewing outside history, ordering and reviewing medications, tests or procedures, care coordination (communications with other health care professionals or caregivers) and documentation in the medical record.

## 2023-11-07 DIAGNOSIS — N186 End stage renal disease: Secondary | ICD-10-CM | POA: Diagnosis not present

## 2023-11-07 DIAGNOSIS — Z992 Dependence on renal dialysis: Secondary | ICD-10-CM | POA: Diagnosis not present

## 2023-11-07 DIAGNOSIS — D631 Anemia in chronic kidney disease: Secondary | ICD-10-CM | POA: Diagnosis not present

## 2023-11-07 DIAGNOSIS — N2581 Secondary hyperparathyroidism of renal origin: Secondary | ICD-10-CM | POA: Diagnosis not present

## 2023-11-10 DIAGNOSIS — D631 Anemia in chronic kidney disease: Secondary | ICD-10-CM | POA: Diagnosis not present

## 2023-11-10 DIAGNOSIS — Z992 Dependence on renal dialysis: Secondary | ICD-10-CM | POA: Diagnosis not present

## 2023-11-10 DIAGNOSIS — N2581 Secondary hyperparathyroidism of renal origin: Secondary | ICD-10-CM | POA: Diagnosis not present

## 2023-11-10 DIAGNOSIS — N186 End stage renal disease: Secondary | ICD-10-CM | POA: Diagnosis not present

## 2023-11-12 DIAGNOSIS — N186 End stage renal disease: Secondary | ICD-10-CM | POA: Diagnosis not present

## 2023-11-12 DIAGNOSIS — D631 Anemia in chronic kidney disease: Secondary | ICD-10-CM | POA: Diagnosis not present

## 2023-11-12 DIAGNOSIS — Z992 Dependence on renal dialysis: Secondary | ICD-10-CM | POA: Diagnosis not present

## 2023-11-12 DIAGNOSIS — N2581 Secondary hyperparathyroidism of renal origin: Secondary | ICD-10-CM | POA: Diagnosis not present

## 2023-11-14 DIAGNOSIS — N186 End stage renal disease: Secondary | ICD-10-CM | POA: Diagnosis not present

## 2023-11-14 DIAGNOSIS — D631 Anemia in chronic kidney disease: Secondary | ICD-10-CM | POA: Diagnosis not present

## 2023-11-14 DIAGNOSIS — N2581 Secondary hyperparathyroidism of renal origin: Secondary | ICD-10-CM | POA: Diagnosis not present

## 2023-11-14 DIAGNOSIS — Z992 Dependence on renal dialysis: Secondary | ICD-10-CM | POA: Diagnosis not present

## 2023-11-17 DIAGNOSIS — D631 Anemia in chronic kidney disease: Secondary | ICD-10-CM | POA: Diagnosis not present

## 2023-11-17 DIAGNOSIS — Z992 Dependence on renal dialysis: Secondary | ICD-10-CM | POA: Diagnosis not present

## 2023-11-17 DIAGNOSIS — N186 End stage renal disease: Secondary | ICD-10-CM | POA: Diagnosis not present

## 2023-11-17 DIAGNOSIS — N2581 Secondary hyperparathyroidism of renal origin: Secondary | ICD-10-CM | POA: Diagnosis not present

## 2023-11-19 DIAGNOSIS — Z992 Dependence on renal dialysis: Secondary | ICD-10-CM | POA: Diagnosis not present

## 2023-11-19 DIAGNOSIS — D631 Anemia in chronic kidney disease: Secondary | ICD-10-CM | POA: Diagnosis not present

## 2023-11-19 DIAGNOSIS — N186 End stage renal disease: Secondary | ICD-10-CM | POA: Diagnosis not present

## 2023-11-19 DIAGNOSIS — N2581 Secondary hyperparathyroidism of renal origin: Secondary | ICD-10-CM | POA: Diagnosis not present

## 2023-11-21 DIAGNOSIS — N2581 Secondary hyperparathyroidism of renal origin: Secondary | ICD-10-CM | POA: Diagnosis not present

## 2023-11-21 DIAGNOSIS — N186 End stage renal disease: Secondary | ICD-10-CM | POA: Diagnosis not present

## 2023-11-21 DIAGNOSIS — Z992 Dependence on renal dialysis: Secondary | ICD-10-CM | POA: Diagnosis not present

## 2023-11-21 DIAGNOSIS — D631 Anemia in chronic kidney disease: Secondary | ICD-10-CM | POA: Diagnosis not present

## 2023-11-23 DIAGNOSIS — Z992 Dependence on renal dialysis: Secondary | ICD-10-CM | POA: Diagnosis not present

## 2023-11-23 DIAGNOSIS — N2581 Secondary hyperparathyroidism of renal origin: Secondary | ICD-10-CM | POA: Diagnosis not present

## 2023-11-23 DIAGNOSIS — N186 End stage renal disease: Secondary | ICD-10-CM | POA: Diagnosis not present

## 2023-11-23 DIAGNOSIS — D631 Anemia in chronic kidney disease: Secondary | ICD-10-CM | POA: Diagnosis not present

## 2023-11-25 DIAGNOSIS — Z992 Dependence on renal dialysis: Secondary | ICD-10-CM | POA: Diagnosis not present

## 2023-11-25 DIAGNOSIS — D631 Anemia in chronic kidney disease: Secondary | ICD-10-CM | POA: Diagnosis not present

## 2023-11-25 DIAGNOSIS — N186 End stage renal disease: Secondary | ICD-10-CM | POA: Diagnosis not present

## 2023-11-25 DIAGNOSIS — N2581 Secondary hyperparathyroidism of renal origin: Secondary | ICD-10-CM | POA: Diagnosis not present

## 2023-11-28 DIAGNOSIS — Z992 Dependence on renal dialysis: Secondary | ICD-10-CM | POA: Diagnosis not present

## 2023-11-28 DIAGNOSIS — N186 End stage renal disease: Secondary | ICD-10-CM | POA: Diagnosis not present

## 2023-11-28 DIAGNOSIS — D631 Anemia in chronic kidney disease: Secondary | ICD-10-CM | POA: Diagnosis not present

## 2023-11-28 DIAGNOSIS — N2581 Secondary hyperparathyroidism of renal origin: Secondary | ICD-10-CM | POA: Diagnosis not present

## 2023-11-30 DIAGNOSIS — Z992 Dependence on renal dialysis: Secondary | ICD-10-CM | POA: Diagnosis not present

## 2023-11-30 DIAGNOSIS — N2581 Secondary hyperparathyroidism of renal origin: Secondary | ICD-10-CM | POA: Diagnosis not present

## 2023-11-30 DIAGNOSIS — D631 Anemia in chronic kidney disease: Secondary | ICD-10-CM | POA: Diagnosis not present

## 2023-11-30 DIAGNOSIS — N186 End stage renal disease: Secondary | ICD-10-CM | POA: Diagnosis not present

## 2023-12-02 ENCOUNTER — Other Ambulatory Visit: Payer: Self-pay | Admitting: Cardiology

## 2023-12-02 ENCOUNTER — Telehealth: Payer: Self-pay

## 2023-12-02 DIAGNOSIS — N186 End stage renal disease: Secondary | ICD-10-CM | POA: Diagnosis not present

## 2023-12-02 DIAGNOSIS — Z992 Dependence on renal dialysis: Secondary | ICD-10-CM | POA: Diagnosis not present

## 2023-12-02 DIAGNOSIS — D631 Anemia in chronic kidney disease: Secondary | ICD-10-CM | POA: Diagnosis not present

## 2023-12-02 DIAGNOSIS — N2581 Secondary hyperparathyroidism of renal origin: Secondary | ICD-10-CM | POA: Diagnosis not present

## 2023-12-02 NOTE — Telephone Encounter (Signed)
*  STAT* If patient is at the pharmacy, call can be transferred to refill team.   1. Which medications need to be refilled? (please list name of each medication and dose if known) new prescription for  Colchicine    2. Would you like to learn more about the convenience, safety, & potential cost savings by using the [facility]?      3. Are you open to using the [facility] (Type [facility]..   4. Which pharmacy/location (including street and city if local pharmacy) is medication to be sent to?[facility] [address], [place]   5. Do they need a 30 day or 90 day supply? 90 days and refills- please call today- out of medicine

## 2023-12-02 NOTE — Telephone Encounter (Signed)
Pt is requesting a refill on colchicine. Would Dr. Mayford Knife like to refill this medication? Please address

## 2023-12-02 NOTE — Telephone Encounter (Signed)
Called pt to schedule diabetic shoe pick up

## 2023-12-02 NOTE — Telephone Encounter (Signed)
Pt is requesting a refill on colchicine. Would Dr. Mayford Knife like to refill this non cardiac medication? Please address

## 2023-12-05 DIAGNOSIS — N186 End stage renal disease: Secondary | ICD-10-CM | POA: Diagnosis not present

## 2023-12-05 DIAGNOSIS — N2581 Secondary hyperparathyroidism of renal origin: Secondary | ICD-10-CM | POA: Diagnosis not present

## 2023-12-05 DIAGNOSIS — Z992 Dependence on renal dialysis: Secondary | ICD-10-CM | POA: Diagnosis not present

## 2023-12-08 ENCOUNTER — Telehealth: Payer: Self-pay | Admitting: Cardiology

## 2023-12-08 DIAGNOSIS — E1122 Type 2 diabetes mellitus with diabetic chronic kidney disease: Secondary | ICD-10-CM | POA: Diagnosis not present

## 2023-12-08 DIAGNOSIS — Z992 Dependence on renal dialysis: Secondary | ICD-10-CM | POA: Diagnosis not present

## 2023-12-08 DIAGNOSIS — D631 Anemia in chronic kidney disease: Secondary | ICD-10-CM | POA: Diagnosis not present

## 2023-12-08 DIAGNOSIS — N2581 Secondary hyperparathyroidism of renal origin: Secondary | ICD-10-CM | POA: Diagnosis not present

## 2023-12-08 DIAGNOSIS — N186 End stage renal disease: Secondary | ICD-10-CM | POA: Diagnosis not present

## 2023-12-08 NOTE — Telephone Encounter (Signed)
 Per Dr. Mayford Knife, patient is also on amio and may have to discontinue use of colchicine since she is on hemodialysis.

## 2023-12-08 NOTE — Telephone Encounter (Signed)
*  STAT* If patient is at the pharmacy, call can be transferred to refill team.   1. Which medications need to be refilled? (please list name of each medication and dose if known) colchicine  0.6 MG tablet  2. Which pharmacy/location (including street and city if local pharmacy) is medication to be sent to? [id number] [place], [place] - [address]    3. Do they need a 30 day or 90 day supply?   90 day supply

## 2023-12-08 NOTE — Telephone Encounter (Signed)
 Dr. Mayford Knife refilled Colchcine to this Pt. Does Dr. Mayford Knife want to refill? Please advise.

## 2023-12-08 NOTE — Telephone Encounter (Signed)
 Call to patient to ascertain if she is still taking both amiodarone  and colchicine . Patient states she thinks she was started on both medications at the same time, possibly back in 11/2022. However, there appear to be discontinued ambulatory and inpatient scripts in epic dating back to 2020 for both colchicine  and amiodarone .  Patient states she currently takes amiodarone  200 mg every day as ordered and takes colchicine  0.3 mg every Monday, Wednesday and Friday as ordered. I advised patient not to take any more colchicine  until she hears from our office, patient verbalized understanding. Explained that amiodarone  and colchichine may not be taken together in patients in ESRD. Patient responses forwarded to Dr. Micael Adas and Pharm D.

## 2023-12-15 DIAGNOSIS — D631 Anemia in chronic kidney disease: Secondary | ICD-10-CM | POA: Diagnosis not present

## 2023-12-15 DIAGNOSIS — Z992 Dependence on renal dialysis: Secondary | ICD-10-CM | POA: Diagnosis not present

## 2023-12-15 DIAGNOSIS — N2581 Secondary hyperparathyroidism of renal origin: Secondary | ICD-10-CM | POA: Diagnosis not present

## 2023-12-15 DIAGNOSIS — N186 End stage renal disease: Secondary | ICD-10-CM | POA: Diagnosis not present

## 2023-12-22 DIAGNOSIS — D631 Anemia in chronic kidney disease: Secondary | ICD-10-CM | POA: Diagnosis not present

## 2023-12-22 DIAGNOSIS — N2581 Secondary hyperparathyroidism of renal origin: Secondary | ICD-10-CM | POA: Diagnosis not present

## 2023-12-22 DIAGNOSIS — Z992 Dependence on renal dialysis: Secondary | ICD-10-CM | POA: Diagnosis not present

## 2023-12-22 DIAGNOSIS — R509 Fever, unspecified: Secondary | ICD-10-CM | POA: Diagnosis not present

## 2023-12-22 DIAGNOSIS — N186 End stage renal disease: Secondary | ICD-10-CM | POA: Diagnosis not present

## 2023-12-25 NOTE — Addendum Note (Signed)
Addended by: Luellen Pucker on: 12/25/2023 05:14 PM   Modules accepted: Orders

## 2023-12-25 NOTE — Telephone Encounter (Signed)
Medication removed from med list.

## 2023-12-25 NOTE — Telephone Encounter (Signed)
Call to patient to advise that Dr. Mayford Knife says she does not need to take colchicine any longer. Patient verbalizes understanding and agrees to discontinue this medication.

## 2023-12-29 DIAGNOSIS — N2581 Secondary hyperparathyroidism of renal origin: Secondary | ICD-10-CM | POA: Diagnosis not present

## 2023-12-29 DIAGNOSIS — N186 End stage renal disease: Secondary | ICD-10-CM | POA: Diagnosis not present

## 2023-12-29 DIAGNOSIS — Z992 Dependence on renal dialysis: Secondary | ICD-10-CM | POA: Diagnosis not present

## 2023-12-29 DIAGNOSIS — D631 Anemia in chronic kidney disease: Secondary | ICD-10-CM | POA: Diagnosis not present

## 2024-01-01 ENCOUNTER — Encounter (HOSPITAL_COMMUNITY)
Admission: RE | Disposition: A | Discharge status: Home / Self Care | Payer: Self-pay | Source: Home / Self Care | Attending: Nephrology

## 2024-01-01 ENCOUNTER — Other Ambulatory Visit: Payer: Self-pay

## 2024-01-01 ENCOUNTER — Ambulatory Visit (HOSPITAL_COMMUNITY)
Admission: RE | Admit: 2024-01-01 | Discharge: 2024-01-01 | Disposition: A | Discharge status: Home / Self Care | Payer: HMO | Attending: Nephrology | Admitting: Nephrology

## 2024-01-01 ENCOUNTER — Encounter (HOSPITAL_COMMUNITY): Payer: Self-pay | Admitting: Nephrology

## 2024-01-01 DIAGNOSIS — Z992 Dependence on renal dialysis: Secondary | ICD-10-CM | POA: Diagnosis not present

## 2024-01-01 DIAGNOSIS — D631 Anemia in chronic kidney disease: Secondary | ICD-10-CM | POA: Insufficient documentation

## 2024-01-01 DIAGNOSIS — E1122 Type 2 diabetes mellitus with diabetic chronic kidney disease: Secondary | ICD-10-CM | POA: Diagnosis not present

## 2024-01-01 DIAGNOSIS — Y832 Surgical operation with anastomosis, bypass or graft as the cause of abnormal reaction of the patient, or of later complication, without mention of misadventure at the time of the procedure: Secondary | ICD-10-CM | POA: Insufficient documentation

## 2024-01-01 DIAGNOSIS — I959 Hypotension, unspecified: Secondary | ICD-10-CM | POA: Insufficient documentation

## 2024-01-01 DIAGNOSIS — E669 Obesity, unspecified: Secondary | ICD-10-CM | POA: Insufficient documentation

## 2024-01-01 DIAGNOSIS — I480 Paroxysmal atrial fibrillation: Secondary | ICD-10-CM | POA: Insufficient documentation

## 2024-01-01 DIAGNOSIS — Z7901 Long term (current) use of anticoagulants: Secondary | ICD-10-CM | POA: Diagnosis not present

## 2024-01-01 DIAGNOSIS — T82898A Other specified complication of vascular prosthetic devices, implants and grafts, initial encounter: Secondary | ICD-10-CM | POA: Insufficient documentation

## 2024-01-01 DIAGNOSIS — Z79899 Other long term (current) drug therapy: Secondary | ICD-10-CM | POA: Diagnosis not present

## 2024-01-01 DIAGNOSIS — N250 Renal osteodystrophy: Secondary | ICD-10-CM | POA: Insufficient documentation

## 2024-01-01 DIAGNOSIS — I120 Hypertensive chronic kidney disease with stage 5 chronic kidney disease or end stage renal disease: Secondary | ICD-10-CM | POA: Diagnosis not present

## 2024-01-01 DIAGNOSIS — Z6836 Body mass index (BMI) 36.0-36.9, adult: Secondary | ICD-10-CM | POA: Diagnosis not present

## 2024-01-01 DIAGNOSIS — Z853 Personal history of malignant neoplasm of breast: Secondary | ICD-10-CM | POA: Diagnosis not present

## 2024-01-01 DIAGNOSIS — N186 End stage renal disease: Secondary | ICD-10-CM | POA: Insufficient documentation

## 2024-01-01 DIAGNOSIS — E282 Polycystic ovarian syndrome: Secondary | ICD-10-CM | POA: Diagnosis not present

## 2024-01-01 HISTORY — PX: A/V FISTULAGRAM: CATH118298

## 2024-01-01 SURGERY — A/V FISTULAGRAM
Anesthesia: LOCAL

## 2024-01-01 MED ORDER — ACETAMINOPHEN 325 MG PO TABS: 650.0000 mg | ORAL_TABLET | ORAL | Status: DC | PRN

## 2024-01-01 MED ORDER — SODIUM CHLORIDE 0.9% FLUSH: 3.0000 mL | INTRAVENOUS | Status: DC | PRN

## 2024-01-01 MED ORDER — SODIUM CHLORIDE 0.9 % IV SOLN: INTRAVENOUS | Status: DC

## 2024-01-01 MED ORDER — LIDOCAINE HCL (PF) 1 % IJ SOLN
INTRAMUSCULAR | Status: AC
  Filled 2024-01-01: qty 30

## 2024-01-01 MED ORDER — IODIXANOL 320 MG/ML IV SOLN
INTRAVENOUS | Status: DC | PRN
  Administered 2024-01-01: 8 mL via INTRAVENOUS

## 2024-01-01 MED ORDER — ONDANSETRON HCL 4 MG/2ML IJ SOLN: 4.0000 mg | Freq: Four times a day (QID) | INTRAMUSCULAR | Status: DC | PRN

## 2024-01-01 MED ORDER — LIDOCAINE HCL (PF) 1 % IJ SOLN
INTRAMUSCULAR | Status: DC | PRN
  Administered 2024-01-01: 2 mL via SUBCUTANEOUS

## 2024-01-01 MED ORDER — HEPARIN (PORCINE) IN NACL 1000-0.9 UT/500ML-% IV SOLN
INTRAVENOUS | Status: DC | PRN
  Administered 2024-01-01: 500 mL

## 2024-01-01 SURGICAL SUPPLY — 3 items
BAG SNAP BAND KOVER 36X36 (MISCELLANEOUS) ×2 IMPLANT
COVER DOME SNAP 22 D (MISCELLANEOUS) ×2 IMPLANT
TRAY PV CATH (CUSTOM PROCEDURE TRAY) ×2 IMPLANT

## 2024-01-01 NOTE — Discharge Instructions (Signed)

## 2024-01-01 NOTE — H&P (Signed)
Chief Complaint: Decreased flows  Interval H&P  The patient has presented today for an angiogram/ angioplasty.  Various methods of treatment have been discussed with the patient.  After consideration of risk, benefits and other options for treatment, the patient has consented to a angiogram/ angioplasty with  possible stent placement.   Risks of angiogram with potential angioplasty and stenting if needed.contrast reaction, extravasation/ bleeding, dissection, hypotension and death were explained to the patient.  The patient's history has been reviewed and the patient has been examined, no changes in status.  Stable for angiogram/angioplasty  I have reviewed the patient's chart and labs.  Questions were answered to the patient's satisfaction.  Dialysis Orders: [facility] MWF  3h   400/1.5   108.5kg  2/2 bath LFA AVF  Hep none - last HD 5/15, 112 > 109kg - last Hb 10.9 on 5/10 - mircera 30 ug q 4wks, last 4/26, due 5/24 - hectorol 2 ug tiw IV  Assessment/Plan: Dialysis access with recent flows-here for angiogram and possible angioplasty.   Lt Cimino fistula was initially placed by Dr. Darrick Penna in September 02, 2012 with creation of collaterals on Apr 27, 2014 by Dr. Hart Rochester, location of ulcerated aneurysm on January 13, 2020 by Dr. Chestine Spore, plication of aneurysm August 11, 2023 by Dr. Hetty Blend, plication by Dr. Karin Lieu on September 22, 2021.  Hypotension - chronic, at baseline.  On midodrine at home 10 tid. Typically takes dose prior to HD and additional dose 1/2 way through treatment if needed.  ESRD - usual HD MWF. HD tomorrow per regular schedule. Anemia ESA and iron @ [facility]. BMD ckd -  continue binder Afib - eliquis is on hold. Is in NSR. Nutrition - Renal diet w/fluid restrictions.  ESRD dialyzing MWF with last dialysis yesterday. Decreased access flows - planning on angiogram with possibly angioplasty of left arm Cimino fistula. Renal osteodystrophy - continue binders per home  regimen. Anemia - managed with ESA's and IV iron at dialysis center. HTN - resume home regimen.   HPI: Brenda Arnold is an 68 y.o. female h/o breast cancer, DM, HTN, OSA, PCOS, obesity, ESRD here for angiogram for decreased access flows.  ROS Per HPI.  Chemistry and CBC: Creatinine  Date/Time Value Ref Range Status  11/06/2023 01:22 PM 8.39 (HH) 0.44 - 1.00 mg/dL Final    Comment:    REPEATED TO VERIFY CRITICAL RESULT CALLED TO, READ BACK BY AND VERIFIED WITH: Val Dodd at 1437 on 5Dec24 by [facility]     Creatinine, Ser  Date/Time Value Ref Range Status  09/24/2023 04:29 AM 10.42 (H) 0.44 - 1.00 mg/dL Final  66/44/0347 42:59 AM 8.40 (H) 0.44 - 1.00 mg/dL Final  56/38/7564 33:29 AM 6.90 (H) 0.44 - 1.00 mg/dL Final  51/88/4166 06:30 PM 11.35 (H) 0.44 - 1.00 mg/dL Final  16/12/930 35:57 AM 9.20 (H) 0.44 - 1.00 mg/dL Final  32/20/2542 70:62 AM 5.10 (H) 0.44 - 1.00 mg/dL Final  37/62/8315 17:61 AM 10.69 (H) 0.44 - 1.00 mg/dL Final  60/73/7106 26:94 PM 6.94 (H) 0.44 - 1.00 mg/dL Final  85/46/2703 50:09 AM 8.97 (H) 0.44 - 1.00 mg/dL Final  38/18/2993 71:69 AM 6.34 (H) 0.44 - 1.00 mg/dL Final  67/89/3810 17:51 AM 5.07 (H) 0.44 - 1.00 mg/dL Final  02/58/5277 82:42 AM 11.54 (H) 0.44 - 1.00 mg/dL Final    Comment:    DELTA CHECK NOTED  11/25/2020 09:05 AM 6.74 (H) 0.44 - 1.00 mg/dL Final    Comment:    DELTA CHECK  NOTED DIALYSIS   11/24/2020 03:22 AM 10.16 (H) 0.44 - 1.00 mg/dL Final  60/45/4098 11:91 PM 9.12 (H) 0.44 - 1.00 mg/dL Final  47/82/9562 13:08 AM 8.90 (H) 0.44 - 1.00 mg/dL Final  65/78/4696 29:52 PM 9.76 (H) 0.44 - 1.00 mg/dL Final  84/13/2440 10:27 PM 8.66 (H) 0.44 - 1.00 mg/dL Final  25/36/6440 34:74 AM 8.30 (H) 0.44 - 1.00 mg/dL Final  25/95/6387 56:43 AM 10.41 (H) 0.44 - 1.00 mg/dL Final  32/95/1884 16:60 AM 8.43 (H) 0.44 - 1.00 mg/dL Final  63/12/6008 93:23 PM 9.48 (H) 0.44 - 1.00 mg/dL Final  55/73/2202 54:27 AM 7.04 (H) 0.44 - 1.00 mg/dL Final  05/25/7627  31:51 AM 11.04 (H) 0.44 - 1.00 mg/dL Final  76/16/0737 10:62 AM 6.13 (H) 0.44 - 1.00 mg/dL Final  69/48/5462 70:35 AM 5.79 (H) 0.44 - 1.00 mg/dL Final    Comment:    DELTA CHECK NOTED  09/08/2019 08:21 AM 9.77 (H) 0.44 - 1.00 mg/dL Final  00/93/8182 99:37 AM 7.19 (H) 0.44 - 1.00 mg/dL Final  16/96/7893 81:01 AM 10.41 (H) 0.44 - 1.00 mg/dL Final  75/09/2584 27:78 AM 9.05 (H) 0.44 - 1.00 mg/dL Final  24/23/5361 44:31 AM 6.81 (H) 0.44 - 1.00 mg/dL Final  54/00/8676 19:50 AM 8.74 (H) 0.44 - 1.00 mg/dL Final  93/26/7124 58:09 AM 5.88 (H) 0.44 - 1.00 mg/dL Final    Comment:    DELTA CHECK NOTED  09/01/2019 03:57 AM 10.38 (H) 0.44 - 1.00 mg/dL Final  98/33/8250 53:97 AM 9.54 (H) 0.44 - 1.00 mg/dL Final  67/34/1937 90:24 AM 14.27 (H) 0.44 - 1.00 mg/dL Final  09/73/5329 92:42 AM 11.84 (H) 0.44 - 1.00 mg/dL Final  68/34/1962 22:97 AM 8.74 (H) 0.44 - 1.00 mg/dL Final  98/92/1194 17:40 AM 6.58 (H) 0.44 - 1.00 mg/dL Final    Comment:    DELTA CHECK NOTED  08/24/2019 06:02 PM 9.90 (H) 0.44 - 1.00 mg/dL Final  81/44/8185 63:14 PM 9.73 (H) 0.44 - 1.00 mg/dL Final  97/01/6377 58:85 AM 9.60 (H) 0.44 - 1.00 mg/dL Final  02/77/4128 78:67 AM 10.29 (H) 0.44 - 1.00 mg/dL Final  67/20/9470 96:28 AM 9.17 (H) 0.44 - 1.00 mg/dL Final  36/62/9476 54:65 AM 9.22 (H) 0.44 - 1.00 mg/dL Final  03/54/6568 12:75 PM 8.81 (H) 0.44 - 1.00 mg/dL Final  17/00/1749 44:96 PM 6.67 (H) 0.44 - 1.00 mg/dL Final  75/91/6384 66:59 AM 7.52 (H) 0.44 - 1.00 mg/dL Final  93/57/0177 93:90 PM 6.93 (H) 0.44 - 1.00 mg/dL Final  30/08/2329 07:62 PM 4.74 (H) 0.44 - 1.00 mg/dL Final  26/33/3545 62:56 AM 6.88 (H) 0.44 - 1.00 mg/dL Final  38/93/7342 87:68 AM 3.21 (H) 0.50 - 1.10 mg/dL Final   No results for input(s): "NA", "K", "CL", "CO2", "GLUCOSE", "BUN", "CREATININE", "CALCIUM", "PHOS" in the last 168 hours.  Invalid input(s): "ALB" No results for input(s): "WBC", "NEUTROABS", "HGB", "HCT", "MCV", "PLT" in the last 168 hours. Liver  Function Tests: No results for input(s): "AST", "ALT", "ALKPHOS", "BILITOT", "PROT", "ALBUMIN" in the last 168 hours. No results for input(s): "LIPASE", "AMYLASE" in the last 168 hours. No results for input(s): "AMMONIA" in the last 168 hours. Cardiac Enzymes: No results for input(s): "CKTOTAL", "CKMB", "CKMBINDEX", "TROPONINI" in the last 168 hours. Iron Studies: No results for input(s): "IRON", "TIBC", "TRANSFERRIN", "FERRITIN" in the last 72 hours. PT/INR: @LABRCNTIP (inr:5)  Xrays/Other Studies: )No results found for this or any previous visit (from the past 48 hours). No results found.  PMH:   Past Medical  History:  Diagnosis Date   Agatston coronary artery calcium score greater than 400    Ca score 3824 on CT 08/2021   Anemia    Arthritis    Asthma    Breast cancer (HCC) 04/04/2023   Complication of anesthesia    difficulty with getting oxygen saturation up- "patient was not aware"  Bottom of feet burning in PACU   Constipation    because of Iron   Diabetes mellitus    not on meds   DVT (deep venous thrombosis) (HCC) 2019   post hip replacement - right   Dyslipidemia    Epistaxis 11/05/2012   ESRD (end stage renal disease) on dialysis [facility])    "MWF; Rud[facility] Anda" (09/15/2018)- started 02/13/2017   History of blood transfusion    HTN (hypertension)    pt denies this. She states her "BP runs low now"   Hyperparathyroidism due to renal insufficiency (HCC)    Hypotension    Neuromuscular disorder (HCC)    Neuropathy    Obesity    s/p panniculectomy   Paroxysmal A-fib (HCC)    a. chronic coumadin;  b. 12/2009 Echo: EF 60-65%, Gr 1 DD.   PCOS (polycystic ovarian syndrome)    Pericarditis 08/2019   pericarditis with pericardial effusion, s/p right VATS/pericardial window 08/31/19   Pneumonia    Seasonal allergies    Sleep apnea    a. not using CPAP, last study  >8 yrs   Vitamin D deficiency     PSH:   Past Surgical History:  Procedure Laterality Date   ABDOMINAL  HYSTERECTOMY     with panniculectomy   AV FISTULA PLACEMENT  09/02/2012   Procedure: ARTERIOVENOUS (AV) FISTULA CREATION;  Surgeon: Sherren Kerns, MD;  Location[facility] OR;  Service: Vascular;  Laterality: Left;  Creation of Left Radial-Cephalic Fistula    BREAST BIOPSY Right 04/04/2023   [place] RT BREAST BX W LOC DEV 1ST LESION IMG BX SPEC US GUIDE 04/04/2023 [facility] MAMMOGRAPHY   BREAST BIOPSY  05/06/2023   [place] RT RADIOACTIVE SEED LOC 05/06/2023 [facility] MAMMOGRAPHY   BREAST LUMPECTOMY WITH RADIOACTIVE SEED LOCALIZATION Right 05/08/2023   Procedure: RIGHT BREAST LUMPECTOMY WITH RADIOACTIVE SEED LOCALIZATION;  Surgeon: Griselda Miner, MD;  Location: [facility] OR;  Service: General;  Laterality: Right;   COLONOSCOPY     COLONOSCOPY W/ BIOPSIES AND POLYPECTOMY     DILATION AND CURETTAGE OF UTERUS     FISTULA SUPERFICIALIZATION Left 09/23/2023   Procedure: PLICATION OF LEFT ARM FISTULA;  Surgeon: Victorino Sparrow, MD;  Location: [facility] OR;  Service: Vascular;  Laterality: Left;   KNEE ARTHROSCOPY Right    PANNICULECTOMY     REVISON OF ARTERIOVENOUS FISTULA Left 04/27/2014   Procedure: REVISON OF LEFT RADIAL-CEPHALIC ARTERIOVENOUS FISTULA;  Surgeon: Pryor Ochoa, MD;  Location: [facility] OR;  Service: Vascular;  Laterality: Left;   REVISON OF ARTERIOVENOUS FISTULA Left 01/13/2020   Procedure: REVISON OF ARTERIOVENOUS FISTULA;  Surgeon: Cephus Shelling, MD;  Location: [facility] OR;  Service: Vascular;  Laterality: Left;   REVISON OF ARTERIOVENOUS FISTULA Left 08/11/2023   Procedure: PLICATION OF LEFT ARM ARTERIOVENOUS FISTULA;  Surgeon: Daria Pastures, MD;  Location: [facility] OR;  Service: Vascular;  Laterality: Left;   TOTAL HIP ARTHROPLASTY Right 09/15/2018   TOTAL HIP ARTHROPLASTY Right 09/15/2018   Procedure: RIGHT TOTAL HIP ARTHROPLASTY ANTERIOR APPROACH;  Surgeon: Tarry Kos, MD;  Location: [facility] OR;  Service: Orthopedics;  Laterality: Right;   TOTAL HIP ARTHROPLASTY Left 11/23/2020  Procedure: LEFT TOTAL HIP ARTHROPLASTY  ANTERIOR APPROACH;  Surgeon: Tarry Kos, MD;  Location: [facility] OR;  Service: Orthopedics;  Laterality: Left;  NEEDS RNFA PLEASE   UVULOPLASTY     VIDEO ASSISTED THORACOSCOPY (VATS)/WEDGE RESECTION Right 08/31/2019   Procedure: VIDEO ASSISTED THORACOSCOPY/ DRAINAGE OF PERICARDIAL EFFUSION/ PERICARDIAL WINDOW/ ABORTED PERICARDIOCENTESIS ;  Surgeon: Corliss Skains, MD;  Location: [facility] OR;  Service: Thoracic;  Laterality: Right;    Allergies:  Allergies  Allergen Reactions   Iodine Shortness Of Breath   Shellfish Allergy Shortness Of Breath    Breathing Problems   Amlodipine Swelling   Felodipine Swelling    Headache   Lisinopril Cough   Chlorhexidine Itching    CHG wipes    Medications:   Prior to Admission medications   Medication Sig Start Date End Date Taking? Authorizing Provider  acetaminophen (TYLENOL) 500 MG tablet Take 2 tablets (1,000 mg total) by mouth every 8 (eight) hours as needed for mild pain. Patient taking differently: Take 500 mg by mouth every 8 (eight) hours as needed for mild pain (pain score 1-3). 04/18/22  Yes Juliet Rude, PA-C  acetaminophen (TYLENOL) 650 MG CR tablet Take 650 mg by mouth every 8 (eight) hours as needed for pain.   Yes [provider]  amiodarone (PACERONE) 200 MG tablet Take 1 tablet (200 mg total) by mouth daily. 11/26/22  Yes Turner, Cornelious Bryant, MD  anastrozole (ARIMIDEX) 1 MG tablet Take 1 tablet (1 mg total) by mouth daily. 08/08/23  Yes Rachel Moulds, MD  atorvastatin (LIPITOR) 40 MG tablet TAKE 1 TABLET BY MOUTH EVERY DAY 10/27/23  Yes Gaston Islam., NP  B Complex-C-Folic Acid (DIALYVITE TABLET) TABS Take 1 tablet by mouth at bedtime. 05/10/20  Yes [provider]  bisacodyl (DULCOLAX) 5 MG EC tablet Take 1 tablet (5 mg total) by mouth daily as needed for moderate constipation. 04/18/22  Yes Trixie Deis R, PA-C  ciprofloxacin-dexamethasone (CIPRODEX) OTIC suspension Place 4 drops into both ears 2 (two) times daily  as needed (ear wax build up). 07/24/23  Yes [provider]  cyclobenzaprine (FLEXERIL) 10 MG tablet Take 10 mg by mouth at bedtime as needed for muscle spasms. 12/25/23  Yes [provider]  docusate sodium (COLACE) 100 MG capsule Take 100 mg by mouth daily.   Yes [provider]  ELIQUIS 5 MG TABS tablet Take 1 tablet (5 mg total) by mouth 2 (two) times daily. 04/22/22  Yes Juliet Rude, PA-C  ethyl chloride spray Apply 1 application  topically every Monday, Wednesday, and Friday with hemodialysis. 03/20/21  Yes [provider]  gabapentin (NEURONTIN) 100 MG capsule Take 100 mg by mouth at bedtime. 05/14/17  Yes [provider]  loratadine (CLARITIN) 10 MG tablet Take 1 tablet (10 mg total) by mouth daily. Patient taking differently: Take 10 mg by mouth daily as needed for allergies. 03/18/21  Yes Drema Dallas, MD  midodrine (PROAMATINE) 10 MG tablet Take 1 tablet (10 mg total) by mouth 3 (three) times daily with meals. 09/09/19  Yes Noralee Stain, DO  oxyCODONE-acetaminophen (PERCOCET) 5-325 MG tablet Take 1 tablet by mouth every 6 (six) hours as needed for severe pain (pain score 7-10) or moderate pain (pain score 4-6). 09/23/23 09/22/24 Yes Schuh, McKenzi P, PA-C  predniSONE (DELTASONE) 20 MG tablet Take 20 mg by mouth 3 (three) times daily. Taper 12/25/23  Yes [provider]  sucroferric oxyhydroxide (VELPHORO) 500 MG chewable tablet Chew 2  tablets (1,000 mg total) by mouth 3 (three) times daily with meals. Patient taking differently: Chew 500-1,000 mg by mouth See admin instructions. Take 1000 mg with meals and 500 mg with snacks 03/18/21  Yes Drema Dallas, MD    Discontinued Meds:   Medications Discontinued During This Encounter  Medication Reason   fluticasone (FLONASE) 50 MCG/ACT nasal spray Patient Preference    Social History:  reports that she has never smoked. She has never used smokeless tobacco. She reports that she does  not drink alcohol and does not use drugs.  Family History:   Family History  Problem Relation Age of Onset   Lung cancer Father        died @ 56   Hypertension Mother    Diabetes Brother    Kidney disease Brother     There were no vitals taken for this visit. General:well appearing female in NAD Heart:RRR, no mrg Lungs:CTAB, nml WOB Abdomen:soft, NTND Extremities:no LE edema Dialysis Access: LU AVF +b/t        Ethelene Hal, MD 01/01/2024, 8:37 AM

## 2024-01-01 NOTE — Op Note (Signed)
Patient presents for concerns of difficult cannulation in his left Cimino which was placed by Dr. Edilia Bo on January 04, 2022.  Patient has had inflow 7 mm balloon angioplasty as well as 5 mm arterial anastomosis angioplasty last on February 12, 2023.Marland Kitchen    On the physical exam, the fistula is hyperpulsatile at the inflow.  Summary:  1)      The patient had a normal fistulogram of a left Cimino fistula.  The fistula is tortuous with dual upper arm drainage primarily through the medial veins.   2)      the upper arm veins, axillary, central veins and also inflow are patent.  This is a megafistula and very large calibered but no focal aneurysm noted.   3)      This left Cimino remains amenable to future percutaneous intervention as long as it remains patent at least 3 months.  Description of procedure: The arm was prepped and draped in the usual sterile fashion. The left forearm Cimino fistula was cannulated (77939) with an 18G Angiocath needle directed in a antegrade direction in arterial limb of the fistula.  Contrast [id number]) injection via the end of the IV.  The angiogram of the fistula (23300) showed a patent large calibered tortuous cannulation zone and outflow body of the cephalic fistula, dual upper arm drainage; primarily medial arm drainage which are all patent.  The axillary vein, centrals inflow anastomosis were widely patent.  Flows were rapid as well.  Hemostasis: A 3-0 ethilon purse string suture was placed at the cannulation site on removal of the sheath.  Sedation: None  Contrast. 8 mL  Monitoring: Because of the patient's comorbid conditions and sedation during the procedure, continuous EKG monitoring and O2 saturation monitoring was performed throughout the procedure by the RN. There were no abnormal arrhythmias encountered.  Complications: None  Diagnoses: I87.1 Stricture of vein  N18.6 ESRD T82.858A Stricture of access  Procedure Coding:  36901 Cannulation and angiogram of  fistula  Q9967 Contrast  Recommendations:  1. Continue to cannulate the fistula with 15G needles.  2. Refer for problems with flows/swelling. 3. Remove the suture next treatment.   Discharge: The patient was discharged home in stable condition. The patient was given education regarding the care of the dialysis access AVF and specific instructions in case of any problems.

## 2024-01-02 ENCOUNTER — Encounter (HOSPITAL_COMMUNITY): Payer: Self-pay | Admitting: Nephrology

## 2024-01-05 DIAGNOSIS — Z992 Dependence on renal dialysis: Secondary | ICD-10-CM | POA: Diagnosis not present

## 2024-01-05 DIAGNOSIS — N2581 Secondary hyperparathyroidism of renal origin: Secondary | ICD-10-CM | POA: Diagnosis not present

## 2024-01-05 DIAGNOSIS — D631 Anemia in chronic kidney disease: Secondary | ICD-10-CM | POA: Diagnosis not present

## 2024-01-05 DIAGNOSIS — N186 End stage renal disease: Secondary | ICD-10-CM | POA: Diagnosis not present

## 2024-01-12 DIAGNOSIS — Z992 Dependence on renal dialysis: Secondary | ICD-10-CM | POA: Diagnosis not present

## 2024-01-12 DIAGNOSIS — D631 Anemia in chronic kidney disease: Secondary | ICD-10-CM | POA: Diagnosis not present

## 2024-01-12 DIAGNOSIS — R52 Pain, unspecified: Secondary | ICD-10-CM | POA: Diagnosis not present

## 2024-01-12 DIAGNOSIS — N2581 Secondary hyperparathyroidism of renal origin: Secondary | ICD-10-CM | POA: Diagnosis not present

## 2024-01-12 DIAGNOSIS — N186 End stage renal disease: Secondary | ICD-10-CM | POA: Diagnosis not present

## 2024-01-14 ENCOUNTER — Ambulatory Visit (INDEPENDENT_AMBULATORY_CARE_PROVIDER_SITE_OTHER): Payer: PPO | Admitting: Podiatry

## 2024-01-14 ENCOUNTER — Encounter: Payer: Self-pay | Admitting: Podiatry

## 2024-01-14 VITALS — Ht 67.5 in | Wt 239.0 lb

## 2024-01-14 DIAGNOSIS — Z992 Dependence on renal dialysis: Secondary | ICD-10-CM

## 2024-01-14 DIAGNOSIS — B351 Tinea unguium: Secondary | ICD-10-CM

## 2024-01-14 DIAGNOSIS — N186 End stage renal disease: Secondary | ICD-10-CM | POA: Diagnosis not present

## 2024-01-14 DIAGNOSIS — M79675 Pain in left toe(s): Secondary | ICD-10-CM | POA: Diagnosis not present

## 2024-01-14 DIAGNOSIS — E0822 Diabetes mellitus due to underlying condition with diabetic chronic kidney disease: Secondary | ICD-10-CM | POA: Diagnosis not present

## 2024-01-14 DIAGNOSIS — L84 Corns and callosities: Secondary | ICD-10-CM

## 2024-01-14 DIAGNOSIS — M79674 Pain in right toe(s): Secondary | ICD-10-CM

## 2024-01-19 DIAGNOSIS — N2581 Secondary hyperparathyroidism of renal origin: Secondary | ICD-10-CM | POA: Diagnosis not present

## 2024-01-19 DIAGNOSIS — Z992 Dependence on renal dialysis: Secondary | ICD-10-CM | POA: Diagnosis not present

## 2024-01-19 DIAGNOSIS — D631 Anemia in chronic kidney disease: Secondary | ICD-10-CM | POA: Diagnosis not present

## 2024-01-19 DIAGNOSIS — N186 End stage renal disease: Secondary | ICD-10-CM | POA: Diagnosis not present

## 2024-01-22 ENCOUNTER — Encounter: Payer: Self-pay | Admitting: Podiatry

## 2024-01-22 NOTE — Progress Notes (Signed)
Subjective:  Patient ID: Brenda Arnold, female    DOB: 17-Feb-1956,  MRN: [id number]  Brenda Arnold presents to clinic today for at risk foot care with h/o NIDDM with ESRD on hemodialysis and callus(es) b/l feet and painful mycotic toenails that are difficult to trim. Painful toenails interfere with ambulation. Aggravating factors include wearing enclosed shoe gear. Pain is relieved with periodic professional debridement. Painful calluses are aggravated when weightbearing with and without shoegear. Pain is relieved with periodic professional debridement. Patient states she retired in January. Chief Complaint  Patient presents with   DFc    She is here for a nail trim,. PCP is Dr Elveria Rising and seen her last 6 months,. Does not remember last A1C.    New problem(s): None.   PCP is [name], Kasandra Knudsen, MD.  Allergies  Allergen Reactions   Iodine Shortness Of Breath   Shellfish Allergy Shortness Of Breath    Breathing Problems   Amlodipine Swelling   Felodipine Swelling    Headache   Lisinopril Cough   Chlorhexidine Itching    CHG wipes    Review of Systems: Negative except as noted in the HPI.  Objective: No changes noted in today's physical examination. There were no vitals filed for this visit. Brenda Arnold is a pleasant 68 y.o. female obese in NAD. AAO x 3.  Vascular Examination: Capillary refill time <4 seconds b/l LE. Palpable pedal pulses b/l LE. Digital hair absent b/l. No pedal edema b/l. Skin temperature gradient warm to cool b/l. No varicosities b/l. No ischemia or gangrene noted b/l LE. No cyanosis or clubbing noted b/l LE.Marland Kitchen  Dermatological Examination: Pedal skin with normal turgor, texture and tone b/l. No open wounds. No interdigital macerations b/l. Toenails 1-5 b/l thickened, discolored, dystrophic with subungual debris. There is pain on palpation to dorsal aspect of nailplates.   Hyperkeratotic lesion(s) submet head 5 b/l.  No erythema, no edema, no  drainage, no fluctuance..  Neurological Examination: Protective sensation intact with 10 gram monofilament b/l LE. Vibratory sensation intact b/l LE. Pt has subjective symptoms of neuropathy.  Musculoskeletal Examination: Muscle strength 5/5 to all lower extremity muscle groups bilaterally. Hammertoe deformity noted 1-5 b/l. Pes planus deformity noted bilateral LE   Assessment/Plan: 1. Pain due to onychomycosis of toenails of both feet   2. Callus   3. Diabetes mellitus due to underlying condition with chronic kidney disease on chronic dialysis, without long-term current use of insulin (HCC)     Patient was evaluated and treated. All patient's and/or POA's questions/concerns addressed on today's visit. Toenails 1-5 debrided in length and girth without incident. Callus(es) submet head 5 b/l pared with sharp debridement without incident. Continue daily foot inspections and monitor blood glucose per PCP/Endocrinologist's recommendations. Continue soft, supportive shoe gear daily. Report any pedal injuries to medical professional. Call office if there are any questions/concerns. -Patient/POA to call should there be question/concern in the interim.   Return in about 4 months (around 05/13/2024).  [name], DPM      [place] LOCATION: [address] [place] [address]                   Office [phone]  [facility] LOCATION: [address] [place], [place] [address] Office [phone]

## 2024-01-26 DIAGNOSIS — D631 Anemia in chronic kidney disease: Secondary | ICD-10-CM | POA: Diagnosis not present

## 2024-01-26 DIAGNOSIS — N2581 Secondary hyperparathyroidism of renal origin: Secondary | ICD-10-CM | POA: Diagnosis not present

## 2024-01-26 DIAGNOSIS — Z992 Dependence on renal dialysis: Secondary | ICD-10-CM | POA: Diagnosis not present

## 2024-01-26 DIAGNOSIS — N186 End stage renal disease: Secondary | ICD-10-CM | POA: Diagnosis not present

## 2024-02-02 DIAGNOSIS — Z992 Dependence on renal dialysis: Secondary | ICD-10-CM | POA: Diagnosis not present

## 2024-02-02 DIAGNOSIS — R52 Pain, unspecified: Secondary | ICD-10-CM | POA: Diagnosis not present

## 2024-02-02 DIAGNOSIS — N186 End stage renal disease: Secondary | ICD-10-CM | POA: Diagnosis not present

## 2024-02-02 DIAGNOSIS — N2581 Secondary hyperparathyroidism of renal origin: Secondary | ICD-10-CM | POA: Diagnosis not present

## 2024-02-02 DIAGNOSIS — D631 Anemia in chronic kidney disease: Secondary | ICD-10-CM | POA: Diagnosis not present

## 2024-02-03 ENCOUNTER — Other Ambulatory Visit: Payer: Self-pay | Admitting: Cardiology

## 2024-02-03 DIAGNOSIS — M5451 Vertebrogenic low back pain: Secondary | ICD-10-CM | POA: Diagnosis not present

## 2024-02-05 DIAGNOSIS — M5451 Vertebrogenic low back pain: Secondary | ICD-10-CM | POA: Diagnosis not present

## 2024-02-09 DIAGNOSIS — N186 End stage renal disease: Secondary | ICD-10-CM | POA: Diagnosis not present

## 2024-02-09 DIAGNOSIS — Z992 Dependence on renal dialysis: Secondary | ICD-10-CM | POA: Diagnosis not present

## 2024-02-09 DIAGNOSIS — N2581 Secondary hyperparathyroidism of renal origin: Secondary | ICD-10-CM | POA: Diagnosis not present

## 2024-02-09 DIAGNOSIS — D631 Anemia in chronic kidney disease: Secondary | ICD-10-CM | POA: Diagnosis not present

## 2024-02-10 DIAGNOSIS — M5451 Vertebrogenic low back pain: Secondary | ICD-10-CM | POA: Diagnosis not present

## 2024-02-12 DIAGNOSIS — M5451 Vertebrogenic low back pain: Secondary | ICD-10-CM | POA: Diagnosis not present

## 2024-02-16 ENCOUNTER — Ambulatory Visit (INDEPENDENT_AMBULATORY_CARE_PROVIDER_SITE_OTHER): Payer: PPO

## 2024-02-16 DIAGNOSIS — M2142 Flat foot [pes planus] (acquired), left foot: Secondary | ICD-10-CM

## 2024-02-16 DIAGNOSIS — N2581 Secondary hyperparathyroidism of renal origin: Secondary | ICD-10-CM | POA: Diagnosis not present

## 2024-02-16 DIAGNOSIS — M2041 Other hammer toe(s) (acquired), right foot: Secondary | ICD-10-CM

## 2024-02-16 DIAGNOSIS — M2042 Other hammer toe(s) (acquired), left foot: Secondary | ICD-10-CM | POA: Diagnosis not present

## 2024-02-16 DIAGNOSIS — D631 Anemia in chronic kidney disease: Secondary | ICD-10-CM | POA: Diagnosis not present

## 2024-02-16 DIAGNOSIS — E0822 Diabetes mellitus due to underlying condition with diabetic chronic kidney disease: Secondary | ICD-10-CM | POA: Diagnosis not present

## 2024-02-16 DIAGNOSIS — Z992 Dependence on renal dialysis: Secondary | ICD-10-CM | POA: Diagnosis not present

## 2024-02-16 DIAGNOSIS — M2141 Flat foot [pes planus] (acquired), right foot: Secondary | ICD-10-CM

## 2024-02-16 DIAGNOSIS — N186 End stage renal disease: Secondary | ICD-10-CM | POA: Diagnosis not present

## 2024-02-16 DIAGNOSIS — L84 Corns and callosities: Secondary | ICD-10-CM

## 2024-02-16 NOTE — Progress Notes (Signed)

## 2024-03-01 DIAGNOSIS — E1122 Type 2 diabetes mellitus with diabetic chronic kidney disease: Secondary | ICD-10-CM | POA: Diagnosis not present

## 2024-03-01 DIAGNOSIS — N186 End stage renal disease: Secondary | ICD-10-CM | POA: Diagnosis not present

## 2024-03-01 DIAGNOSIS — N2581 Secondary hyperparathyroidism of renal origin: Secondary | ICD-10-CM | POA: Diagnosis not present

## 2024-03-01 DIAGNOSIS — Z992 Dependence on renal dialysis: Secondary | ICD-10-CM | POA: Diagnosis not present

## 2024-03-03 DIAGNOSIS — N186 End stage renal disease: Secondary | ICD-10-CM | POA: Diagnosis not present

## 2024-03-03 DIAGNOSIS — Z992 Dependence on renal dialysis: Secondary | ICD-10-CM | POA: Diagnosis not present

## 2024-03-03 DIAGNOSIS — E1122 Type 2 diabetes mellitus with diabetic chronic kidney disease: Secondary | ICD-10-CM | POA: Diagnosis not present

## 2024-03-03 DIAGNOSIS — N2581 Secondary hyperparathyroidism of renal origin: Secondary | ICD-10-CM | POA: Diagnosis not present

## 2024-03-03 DIAGNOSIS — D509 Iron deficiency anemia, unspecified: Secondary | ICD-10-CM | POA: Diagnosis not present

## 2024-03-03 DIAGNOSIS — D631 Anemia in chronic kidney disease: Secondary | ICD-10-CM | POA: Diagnosis not present

## 2024-03-08 DIAGNOSIS — E1122 Type 2 diabetes mellitus with diabetic chronic kidney disease: Secondary | ICD-10-CM | POA: Diagnosis not present

## 2024-03-08 DIAGNOSIS — D509 Iron deficiency anemia, unspecified: Secondary | ICD-10-CM | POA: Diagnosis not present

## 2024-03-08 DIAGNOSIS — N2581 Secondary hyperparathyroidism of renal origin: Secondary | ICD-10-CM | POA: Diagnosis not present

## 2024-03-08 DIAGNOSIS — D631 Anemia in chronic kidney disease: Secondary | ICD-10-CM | POA: Diagnosis not present

## 2024-03-08 DIAGNOSIS — Z992 Dependence on renal dialysis: Secondary | ICD-10-CM | POA: Diagnosis not present

## 2024-03-08 DIAGNOSIS — N186 End stage renal disease: Secondary | ICD-10-CM | POA: Diagnosis not present

## 2024-03-15 DIAGNOSIS — Z992 Dependence on renal dialysis: Secondary | ICD-10-CM | POA: Diagnosis not present

## 2024-03-15 DIAGNOSIS — N2581 Secondary hyperparathyroidism of renal origin: Secondary | ICD-10-CM | POA: Diagnosis not present

## 2024-03-15 DIAGNOSIS — N186 End stage renal disease: Secondary | ICD-10-CM | POA: Diagnosis not present

## 2024-03-15 DIAGNOSIS — D509 Iron deficiency anemia, unspecified: Secondary | ICD-10-CM | POA: Diagnosis not present

## 2024-03-16 ENCOUNTER — Telehealth: Payer: Self-pay | Admitting: Cardiology

## 2024-03-16 NOTE — Telephone Encounter (Signed)
 Pt c/o medication issue:  1. Name of Medication:   ELIQUIS 5 MG TABS tablet    2. How are you currently taking this medication (dosage and times per day)?   3. Are you having a reaction (difficulty breathing--STAT)? no  4. What is your medication issue? Calling to see if there is a less expensive medication she can take. Please advise

## 2024-03-16 NOTE — Telephone Encounter (Signed)
 Spoke to patient she stated her insurance changed and Eliquis too expensive.She does not want to take Coumadin.Patient assistance forms mailed to patient.She will complete and bring back to office along with proof of her income.Advised I will send message to Dr.Turner's RN to have Dr.Turner complete physician part.When she receives forms she will fax altogether.

## 2024-03-18 ENCOUNTER — Telehealth: Payer: Self-pay | Admitting: Pharmacy Technician

## 2024-03-18 NOTE — Telephone Encounter (Signed)
 Scanned provider portion in media. Waiting on patient portion, then will send to BMS   Wyn Heater, RN     03/18/24  9:26 AM Note Form has been signed and faxed to [facility] team     Schlanger, Lonnell Rob, RN to Jacqueline Matsu, MD     03/16/24 10:12 AM FYI. The patient assistance forms for this patient were placed in your box.    03/16/24  9:17 AM Pugh, Lindsey Rhodes D, LPN routed this conversation to Schlanger, Lonnell Rob, RN  Pugh, Katheryne Pane, LPN     6/64/40  9:17 AM Note Spoke to patient she stated her insurance changed and Eliquis too expensive.She does not want to take Coumadin.Patient assistance forms mailed to patient.She will complete and bring back to office along with proof of her income.Advised I will send message to Dr.Turner's RN to have Dr.Turner complete physician part.When she receives forms she will fax altogether.

## 2024-03-18 NOTE — Telephone Encounter (Signed)
 Form has been signed and faxed to [facility] team

## 2024-03-18 NOTE — Telephone Encounter (Signed)
 Received provider portion. Made a new encounter to follow up on patient portion

## 2024-03-22 DIAGNOSIS — D509 Iron deficiency anemia, unspecified: Secondary | ICD-10-CM | POA: Diagnosis not present

## 2024-03-22 DIAGNOSIS — N2581 Secondary hyperparathyroidism of renal origin: Secondary | ICD-10-CM | POA: Diagnosis not present

## 2024-03-22 DIAGNOSIS — N186 End stage renal disease: Secondary | ICD-10-CM | POA: Diagnosis not present

## 2024-03-22 DIAGNOSIS — Z992 Dependence on renal dialysis: Secondary | ICD-10-CM | POA: Diagnosis not present

## 2024-03-25 ENCOUNTER — Other Ambulatory Visit (HOSPITAL_COMMUNITY): Payer: Self-pay

## 2024-03-25 NOTE — Telephone Encounter (Signed)
 I called and spoke to the patient and she went ahead a purchased the medication since it was 117.00 for 90 days. I tried a claim again to see what the copay would be for next time but its too soon until 05/22/24. I told her to let us  know if she needs assistance in the future. Also she said she never received the application.

## 2024-03-29 DIAGNOSIS — N2581 Secondary hyperparathyroidism of renal origin: Secondary | ICD-10-CM | POA: Diagnosis not present

## 2024-03-29 DIAGNOSIS — Z992 Dependence on renal dialysis: Secondary | ICD-10-CM | POA: Diagnosis not present

## 2024-03-29 DIAGNOSIS — N186 End stage renal disease: Secondary | ICD-10-CM | POA: Diagnosis not present

## 2024-03-29 DIAGNOSIS — D509 Iron deficiency anemia, unspecified: Secondary | ICD-10-CM | POA: Diagnosis not present

## 2024-03-29 DIAGNOSIS — D631 Anemia in chronic kidney disease: Secondary | ICD-10-CM | POA: Diagnosis not present

## 2024-03-31 DIAGNOSIS — E1122 Type 2 diabetes mellitus with diabetic chronic kidney disease: Secondary | ICD-10-CM | POA: Diagnosis not present

## 2024-03-31 DIAGNOSIS — N186 End stage renal disease: Secondary | ICD-10-CM | POA: Diagnosis not present

## 2024-03-31 DIAGNOSIS — Z992 Dependence on renal dialysis: Secondary | ICD-10-CM | POA: Diagnosis not present

## 2024-04-01 DIAGNOSIS — G4730 Sleep apnea, unspecified: Secondary | ICD-10-CM | POA: Diagnosis not present

## 2024-04-01 DIAGNOSIS — E7800 Pure hypercholesterolemia, unspecified: Secondary | ICD-10-CM | POA: Diagnosis not present

## 2024-04-01 DIAGNOSIS — M792 Neuralgia and neuritis, unspecified: Secondary | ICD-10-CM | POA: Diagnosis not present

## 2024-04-01 DIAGNOSIS — C50911 Malignant neoplasm of unspecified site of right female breast: Secondary | ICD-10-CM | POA: Diagnosis not present

## 2024-04-01 DIAGNOSIS — I700 Atherosclerosis of aorta: Secondary | ICD-10-CM | POA: Diagnosis not present

## 2024-04-01 DIAGNOSIS — I129 Hypertensive chronic kidney disease with stage 1 through stage 4 chronic kidney disease, or unspecified chronic kidney disease: Secondary | ICD-10-CM | POA: Diagnosis not present

## 2024-04-01 DIAGNOSIS — I480 Paroxysmal atrial fibrillation: Secondary | ICD-10-CM | POA: Diagnosis not present

## 2024-04-01 DIAGNOSIS — Z992 Dependence on renal dialysis: Secondary | ICD-10-CM | POA: Diagnosis not present

## 2024-04-01 DIAGNOSIS — E1121 Type 2 diabetes mellitus with diabetic nephropathy: Secondary | ICD-10-CM | POA: Diagnosis not present

## 2024-04-01 DIAGNOSIS — I950 Idiopathic hypotension: Secondary | ICD-10-CM | POA: Diagnosis not present

## 2024-04-01 DIAGNOSIS — N186 End stage renal disease: Secondary | ICD-10-CM | POA: Diagnosis not present

## 2024-04-02 DIAGNOSIS — Z992 Dependence on renal dialysis: Secondary | ICD-10-CM | POA: Diagnosis not present

## 2024-04-02 DIAGNOSIS — N2581 Secondary hyperparathyroidism of renal origin: Secondary | ICD-10-CM | POA: Diagnosis not present

## 2024-04-02 DIAGNOSIS — N186 End stage renal disease: Secondary | ICD-10-CM | POA: Diagnosis not present

## 2024-04-05 DIAGNOSIS — Z992 Dependence on renal dialysis: Secondary | ICD-10-CM | POA: Diagnosis not present

## 2024-04-05 DIAGNOSIS — N186 End stage renal disease: Secondary | ICD-10-CM | POA: Diagnosis not present

## 2024-04-05 DIAGNOSIS — N2581 Secondary hyperparathyroidism of renal origin: Secondary | ICD-10-CM | POA: Diagnosis not present

## 2024-04-05 DIAGNOSIS — D509 Iron deficiency anemia, unspecified: Secondary | ICD-10-CM | POA: Diagnosis not present

## 2024-04-12 DIAGNOSIS — N186 End stage renal disease: Secondary | ICD-10-CM | POA: Diagnosis not present

## 2024-04-12 DIAGNOSIS — D509 Iron deficiency anemia, unspecified: Secondary | ICD-10-CM | POA: Diagnosis not present

## 2024-04-12 DIAGNOSIS — L0390 Cellulitis, unspecified: Secondary | ICD-10-CM | POA: Diagnosis not present

## 2024-04-12 DIAGNOSIS — Z992 Dependence on renal dialysis: Secondary | ICD-10-CM | POA: Diagnosis not present

## 2024-04-12 DIAGNOSIS — N2581 Secondary hyperparathyroidism of renal origin: Secondary | ICD-10-CM | POA: Diagnosis not present

## 2024-04-12 DIAGNOSIS — D631 Anemia in chronic kidney disease: Secondary | ICD-10-CM | POA: Diagnosis not present

## 2024-04-13 ENCOUNTER — Ambulatory Visit (HOSPITAL_COMMUNITY)
Admission: RE | Admit: 2024-04-13 | Discharge: 2024-04-13 | Disposition: A | Discharge status: Home / Self Care | Attending: Nephrology | Admitting: Nephrology

## 2024-04-13 ENCOUNTER — Encounter (HOSPITAL_COMMUNITY): Payer: Self-pay | Admitting: Nephrology

## 2024-04-13 ENCOUNTER — Encounter (HOSPITAL_COMMUNITY)
Admission: RE | Disposition: A | Discharge status: Home / Self Care | Payer: Self-pay | Source: Home / Self Care | Attending: Nephrology

## 2024-04-13 ENCOUNTER — Other Ambulatory Visit: Payer: Self-pay

## 2024-04-13 DIAGNOSIS — Z86718 Personal history of other venous thrombosis and embolism: Secondary | ICD-10-CM | POA: Diagnosis not present

## 2024-04-13 DIAGNOSIS — D631 Anemia in chronic kidney disease: Secondary | ICD-10-CM | POA: Insufficient documentation

## 2024-04-13 DIAGNOSIS — E1122 Type 2 diabetes mellitus with diabetic chronic kidney disease: Secondary | ICD-10-CM | POA: Insufficient documentation

## 2024-04-13 DIAGNOSIS — Z6836 Body mass index (BMI) 36.0-36.9, adult: Secondary | ICD-10-CM | POA: Diagnosis not present

## 2024-04-13 DIAGNOSIS — G4733 Obstructive sleep apnea (adult) (pediatric): Secondary | ICD-10-CM | POA: Insufficient documentation

## 2024-04-13 DIAGNOSIS — Y832 Surgical operation with anastomosis, bypass or graft as the cause of abnormal reaction of the patient, or of later complication, without mention of misadventure at the time of the procedure: Secondary | ICD-10-CM | POA: Diagnosis not present

## 2024-04-13 DIAGNOSIS — N186 End stage renal disease: Secondary | ICD-10-CM | POA: Insufficient documentation

## 2024-04-13 DIAGNOSIS — I871 Compression of vein: Secondary | ICD-10-CM | POA: Diagnosis not present

## 2024-04-13 DIAGNOSIS — E282 Polycystic ovarian syndrome: Secondary | ICD-10-CM | POA: Insufficient documentation

## 2024-04-13 DIAGNOSIS — T82858A Stenosis of vascular prosthetic devices, implants and grafts, initial encounter: Secondary | ICD-10-CM | POA: Insufficient documentation

## 2024-04-13 DIAGNOSIS — E1140 Type 2 diabetes mellitus with diabetic neuropathy, unspecified: Secondary | ICD-10-CM | POA: Insufficient documentation

## 2024-04-13 DIAGNOSIS — I120 Hypertensive chronic kidney disease with stage 5 chronic kidney disease or end stage renal disease: Secondary | ICD-10-CM | POA: Insufficient documentation

## 2024-04-13 DIAGNOSIS — Z992 Dependence on renal dialysis: Secondary | ICD-10-CM | POA: Insufficient documentation

## 2024-04-13 DIAGNOSIS — Z853 Personal history of malignant neoplasm of breast: Secondary | ICD-10-CM | POA: Insufficient documentation

## 2024-04-13 DIAGNOSIS — E669 Obesity, unspecified: Secondary | ICD-10-CM | POA: Diagnosis not present

## 2024-04-13 HISTORY — PX: A/V SHUNT INTERVENTION: CATH118220

## 2024-04-13 SURGERY — A/V SHUNT INTERVENTION
Anesthesia: LOCAL

## 2024-04-13 MED ORDER — LIDOCAINE HCL (PF) 1 % IJ SOLN
INTRAMUSCULAR | Status: DC | PRN
  Administered 2024-04-13: 5 mL via INTRADERMAL

## 2024-04-13 MED ORDER — DIPHENHYDRAMINE HCL 50 MG/ML IJ SOLN
INTRAMUSCULAR | Status: DC | PRN
  Administered 2024-04-13: 50 mg via INTRAVENOUS

## 2024-04-13 MED ORDER — SODIUM CHLORIDE 0.9 % IV SOLN: INTRAVENOUS | Status: DC

## 2024-04-13 MED ORDER — IODIXANOL 320 MG/ML IV SOLN
INTRAVENOUS | Status: DC | PRN
  Administered 2024-04-13: 8 mL

## 2024-04-13 MED ORDER — LIDOCAINE HCL (PF) 1 % IJ SOLN
INTRAMUSCULAR | Status: AC
  Filled 2024-04-13: qty 30

## 2024-04-13 MED ORDER — DIPHENHYDRAMINE HCL 50 MG/ML IJ SOLN
INTRAMUSCULAR | Status: AC
  Filled 2024-04-13: qty 1

## 2024-04-13 SURGICAL SUPPLY — 8 items
BAG SNAP BAND KOVER 36X36 (MISCELLANEOUS) ×2 IMPLANT
BALLOON MUSTANG 8.0X40 75 (BALLOONS) IMPLANT
COVER DOME SNAP 22 D (MISCELLANEOUS) ×2 IMPLANT
GUIDEWIRE ANGLED .035 180CM (WIRE) IMPLANT
KIT MICROPUNCTURE NIT STIFF (SHEATH) IMPLANT
SHEATH PINNACLE R/O II 7F 4CM (SHEATH) IMPLANT
SYR MEDALLION 10ML (SYRINGE) IMPLANT
TRAY PV CATH (CUSTOM PROCEDURE TRAY) ×2 IMPLANT

## 2024-04-13 NOTE — Op Note (Signed)
 Patient presents with decreased access flows of her left RCF (placed 2015 with surgical revision in 2021, 2022 and 2024). Her last procedure here was a normal fistulogram study 3 months ago. On examination, the radial cephalic fistula is hyperpulsatile with a scab in the arterial cannulation zone and a poor thrill in the outflow. Augmentation is normal. No signs of impending rupture were seen over scabbed area.   Summary:  1)      The patient had successful angioplasty (8 mm Mustang FE ~18 atm) of significant 70% stenosis in the outflow cephalic vein in the venous limb of the fistula.  2)      The body of the cephalic vein fistula, during upper arm drainage including cephalic arch/axillary vein and left central veins were patent with good flows. 3)      This left radiocephalic fistula remains amenable to future percutaneous intervention.  Description of procedure: The arm was prepped and draped in the usual sterile fashion. The left forearm radial cephalic fistula was cannulated (13086) with a 21G micropuncture needle directed in an antegrade direction. A guidewire was inserted and exchanged for a 7Fr sheath. Contrast [phone]) injection via the side port of the sheath was performed. The angiogram of the fistula (96295) showed a focal 70% cephalic vein/venous limb of the fistula stenosis with patent dual upper arm drainage including a patent cephalic vein arch, axillary vein and left central veins.  The wire was advanced and parked in the outflow basilic vein without any difficulty. An 8 x 40 mm Mustang angioplasty balloon was inserted over a glide wire and positioned at the outflow cephalic vein stenosis. Venous angioplasty (28413) was carried out to 18 ATM with FULL effacement of the waist on the balloon.  Repeat angiogram showed 10% residual at the site of angioplasty with no evidence of extravasation.  The flow of contrast was quicker and the fistula was markedly less pulsatile.  Hemostasis: A 3-0  ethilon purse string suture was placed at the cannulation site on removal of the sheath.  Sedation: None Sedation time.  Not applicable  Contrast.  8 mL  Monitoring: Because of the patient's comorbid conditions and sedation during the procedure, continuous EKG monitoring and O2 saturation monitoring was performed throughout the procedure by the RN. There were no abnormal arrhythmias encountered.  Complications: None.   Diagnoses: I87.1 Stricture of vein  N18.6 ESRD T82.858A Stricture of access  Procedure Coding:  [phone] Cannulation and angiogram of fistula, venous angioplasty (cephalic vein-outflow/venous limb of fistula) U2725 Contrast  Recommendations:  1. Continue to cannulate the fistula with 15G needles.  2. Refer back for problems with flows.  Refer to vascular surgery if scab over cannulation site does not heal by 05/02/2024. 3. Remove the suture next treatment.   Discharge: The patient was discharged home in stable condition. The patient was given education regarding the care of the dialysis access AVF and specific instructions in case of any problems.

## 2024-04-13 NOTE — H&P (Signed)
 Chief Complaint: Decreased access flows HPI:  68 year old woman with past medical history significant for type 2 diabetes mellitus, hypertension, history of breast cancer status posttreatment, obstructive sleep apnea, obesity, polycystic ovarian syndrome, history of postoperative DVT and end-stage renal disease on hemodialysis.  She is referred for concerns of decreasing access flows of her left radiocephalic fistula.  This fistula was created in 2013 with ligation of collaterals in 2015.  In 2021, she had plication of an ulcerated aneurysm with repeat fistula plication again in 2022 and 2024.  Her last procedure here was a normal fistulogram in January 2025.  It was noted that the fistula is tortuous with dual upper arm drainage primarily through the median veins.  She denies any constitutional complaints suggestive of infection and currently does not have any chest pain or shortness of breath.  She recently sustained a right leg skin tear that she has been treating topically.  Past Medical History:  Diagnosis Date   Agatston coronary artery calcium  score greater than 400    Ca score 3824 on CT 08/2021   Anemia    Arthritis    Asthma    Breast cancer (HCC) 04/04/2023   Complication of anesthesia    difficulty with getting oxygen saturation up- "patient was not aware"  Bottom of feet burning in PACU   Constipation    because of Iron    Diabetes mellitus    not on meds   DVT (deep venous thrombosis) (HCC) 2019   post hip replacement - right   Dyslipidemia    Epistaxis 11/05/2012   ESRD (end stage renal disease) on dialysis [facility])    "MWF; [facility]" (09/15/2018)- started 02/13/2017   History of blood transfusion    HTN (hypertension)    pt denies this. She states her "BP runs low now"   Hyperparathyroidism due to renal insufficiency (HCC)    Hypotension    Neuromuscular disorder (HCC)    Neuropathy    Obesity    s/p panniculectomy   Paroxysmal A-fib (HCC)    a. chronic coumadin ;   b. 12/2009 Echo: EF 60-65%, Gr 1 DD.   PCOS (polycystic ovarian syndrome)    Pericarditis 08/2019   pericarditis with pericardial effusion, s/p right VATS/pericardial window 08/31/19   Pneumonia    Seasonal allergies    Sleep apnea    a. not using CPAP, last study  >8 yrs   Vitamin D  deficiency     Past Surgical History:  Procedure Laterality Date   A/V FISTULAGRAM N/A 01/01/2024   Procedure: A/V Fistulagram;  Surgeon: Patrick Boor, MD;  Location: [facility];  Service: Cardiovascular;  Laterality: N/A;   ABDOMINAL HYSTERECTOMY     with panniculectomy   AV FISTULA PLACEMENT  09/02/2012   Procedure: ARTERIOVENOUS (AV) FISTULA CREATION;  Surgeon: Richrd Char, MD;  Location: [facility] OR;  Service: Vascular;  Laterality: Left;  Creation of Left Radial-Cephalic Fistula    BREAST BIOPSY Right 04/04/2023   US  RT BREAST BX W LOC DEV 1ST LESION IMG BX SPEC US  GUIDE 04/04/2023 [facility]   BREAST BIOPSY  05/06/2023   US  RT RADIOACTIVE SEED LOC 05/06/2023 [facility]MO[facility]   BREAST LUMPECTOMY WITH RADIOACTIVE SEED LOCALIZATION Right 05/08/2023   Procedure: RIGHT BREAST LUMPECTOMY WITH RADIOACTIVE SEED LOCALIZATION;  Surgeon: Caralyn Chandler, MD;  Location: [facility] OR;  Service: General;  Laterality: Right;   COLONOSCOPY     COLONOSCOPY W/ BIOPSIES AND POLYPECTOMY     DILATION AND CURETTAGE OF UTERUS  FISTULA SUPERFICIALIZATION Left 09/23/2023   Procedure: PLICATION OF LEFT ARM FISTULA;  Surgeon: Kayla Part, MD;  Location: [facility] OR;  Service: Vascular;  Laterality: Left;   KNEE ARTHROSCOPY Right    PANNICULECTOMY     REVISON OF ARTERIOVENOUS FISTULA Left 04/27/2014   Procedure: REVISON OF LEFT RADIAL-CEPHALIC ARTERIOVENOUS FISTULA;  Surgeon: Palma Bob, MD;  Location: [facility] OR;  Service: Vascular;  Laterality: Left;   REVISON OF ARTERIOVENOUS FISTULA Left 01/13/2020   Procedure: REVISON OF ARTERIOVENOUS FISTULA;  Surgeon: Young Hensen, MD;  Location: [facility] OR;  Service: Vascular;   Laterality: Left;   REVISON OF ARTERIOVENOUS FISTULA Left 08/11/2023   Procedure: PLICATION OF LEFT ARM ARTERIOVENOUS FISTULA;  Surgeon: Philipp Brawn, MD;  Location: [facility] OR;  Service: Vascular;  Laterality: Left;   TOTAL HIP ARTHROPLASTY Right 09/15/2018   TOTAL HIP ARTHROPLASTY Right 09/15/2018   Procedure: RIGHT TOTAL HIP ARTHROPLASTY ANTERIOR APPROACH;  Surgeon: Wes Hamman, MD;  Location: [facility] OR;  Service: Orthopedics;  Laterality: Right;   TOTAL HIP ARTHROPLASTY Left 11/23/2020   Procedure: LEFT TOTAL HIP ARTHROPLASTY ANTERIOR APPROACH;  Surgeon: Wes Hamman, MD;  Location: [facility] OR;  Service: Orthopedics;  Laterality: Left;  NEEDS RNFA PLEASE   UVULOPLASTY     VIDEO ASSISTED THORACOSCOPY (VATS)/WEDGE RESECTION Right 08/31/2019   Procedure: VIDEO ASSISTED THORACOSCOPY/ DRAINAGE OF PERICARDIAL EFFUSION/ PERICARDIAL WINDOW/ ABORTED PERICARDIOCENTESIS ;  Surgeon: Hilarie Lovely, MD;  Location: [facility] OR;  Service: Thoracic;  Laterality: Right;    Family History  Problem Relation Age of Onset   Lung cancer Father        died @ 2   Hypertension Mother    Diabetes Brother    Kidney disease Brother    Social History:  reports that she has never smoked. She has never used smokeless tobacco. She reports that she does not drink alcohol and does not use drugs.  Allergies:  Allergies  Allergen Reactions   Iodine  Shortness Of Breath   Shellfish Allergy Shortness Of Breath    Breathing Problems   Amlodipine Swelling   Felodipine Swelling    Headache   Lisinopril Cough   Chlorhexidine  Itching    CHG wipes    Medications Prior to Admission  Medication Sig Dispense Refill   acetaminophen  (TYLENOL ) 500 MG tablet Take 2 tablets (1,000 mg total) by mouth every 8 (eight) hours as needed for mild pain. (Patient taking differently: Take 500 mg by mouth every 8 (eight) hours as needed for mild pain (pain score 1-3).)     acetaminophen  (TYLENOL ) 650 MG CR tablet Take 650 mg by mouth every 8  (eight) hours as needed for pain.     amiodarone  (PACERONE ) 200 MG tablet TAKE 1 TABLET BY MOUTH EVERY DAY 90 tablet 2   anastrozole  (ARIMIDEX ) 1 MG tablet Take 1 tablet (1 mg total) by mouth daily. 90 tablet 3   atorvastatin  (LIPITOR) 40 MG tablet TAKE 1 TABLET BY MOUTH EVERY DAY 90 tablet 3   B Complex-C-Folic Acid  (DIALYVITE  TABLET) TABS Take 1 tablet by mouth at bedtime.     bisacodyl  (DULCOLAX) 5 MG EC tablet Take 1 tablet (5 mg total) by mouth daily as needed for moderate constipation.     ciprofloxacin-dexamethasone  (CIPRODEX) OTIC suspension Place 4 drops into both ears 2 (two) times daily as needed (ear wax build up).     cyclobenzaprine (FLEXERIL) 10 MG tablet Take 10 mg by mouth at bedtime as needed for muscle spasms.  docusate sodium  (COLACE) 100 MG capsule Take 100 mg by mouth daily as needed for mild constipation or moderate constipation.     ELIQUIS  5 MG TABS tablet Take 1 tablet (5 mg total) by mouth 2 (two) times daily. 180 tablet 3   ethyl chloride spray Apply 1 application  topically every Monday, Wednesday, and Friday with hemodialysis.     gabapentin  (NEURONTIN ) 100 MG capsule Take 100 mg by mouth at bedtime.  3   loratadine  (CLARITIN ) 10 MG tablet Take 1 tablet (10 mg total) by mouth daily. (Patient taking differently: Take 10 mg by mouth daily as needed for allergies.) 30 tablet 0   midodrine  (PROAMATINE ) 10 MG tablet Take 1 tablet (10 mg total) by mouth 3 (three) times daily with meals.     oxyCODONE -acetaminophen  (PERCOCET) 5-325 MG tablet Take 1 tablet by mouth every 6 (six) hours as needed for severe pain (pain score 7-10) or moderate pain (pain score 4-6). 15 tablet 0   sucroferric oxyhydroxide (VELPHORO ) 500 MG chewable tablet Chew 2 tablets (1,000 mg total) by mouth 3 (three) times daily with meals. (Patient taking differently: Chew 500-1,000 mg by mouth See admin instructions. Take 1000 mg with meals and 500 mg with snacks) 180 tablet 0   iron  sucrose in sodium  chloride 0.9 % 100 mL Inject 50 mg into the vein.     Methoxy PEG-Epoetin Beta (MIRCERA IJ) 50 mcg.      No results found for this or any previous visit (from the past 48 hours). No results found.  Review of Systems  Skin:  Positive for wound.       Right leg pretibial skin tear from recent injury  All other systems reviewed and are negative.   Blood pressure (!) 101/42, pulse 75, resp. rate 16, height 5\' 7"  (1.702 m), weight 106.6 kg, SpO2 97%. Physical Exam Vitals reviewed.  Constitutional:      General: She is not in acute distress.    Appearance: Normal appearance. She is obese. She is not ill-appearing.  HENT:     Head: Normocephalic and atraumatic.     Nose: Nose normal.     Mouth/Throat:     Mouth: Mucous membranes are dry.     Pharynx: Oropharynx is clear.  Eyes:     Extraocular Movements: Extraocular movements intact.     Conjunctiva/sclera: Conjunctivae normal.  Cardiovascular:     Rate and Rhythm: Normal rate and regular rhythm.     Pulses: Normal pulses.  Pulmonary:     Effort: Pulmonary effort is normal.     Breath sounds: Normal breath sounds.  Musculoskeletal:     Cervical back: Normal range of motion and neck supple.     Comments: Left radiocephalic fistula with areas of hypopigmentation in the active cannulation zone and a scab over the arterial cannulation zone.  Fistula is mildly hyper pulsatile and collapses poorly with arm elevation.  Neurological:     Mental Status: She is alert.      Assessment/Plan 1.  Decreased access flows: Will undertake fistulogram today to evaluate for etiology and offer endovascular management/angioplasty as indicated.  The procedure was explained to the patient and she consents to proceed after weighing risks and benefits. 2.  End-stage renal disease: Resume hemodialysis on MWF schedule following completion of procedure today. 3.  Hypertension: Blood pressure noted to be on the lower side, will monitor with moderate sedation  during procedure. 4.  Anemia: Denies overt blood loss, resume hemoglobin/hematocrit monitoring and ESA  per outpatient dialysis unit protocol.  Melodie Spry, MD 04/13/2024, 7:25 AM

## 2024-04-16 ENCOUNTER — Ambulatory Visit
Admission: EM | Admit: 2024-04-16 | Discharge: 2024-04-16 | Disposition: A | Discharge status: Home / Self Care | Attending: Family Medicine | Admitting: Family Medicine

## 2024-04-16 DIAGNOSIS — S81801A Unspecified open wound, right lower leg, initial encounter: Secondary | ICD-10-CM

## 2024-04-16 DIAGNOSIS — Z23 Encounter for immunization: Secondary | ICD-10-CM | POA: Diagnosis not present

## 2024-04-16 DIAGNOSIS — L03115 Cellulitis of right lower limb: Secondary | ICD-10-CM | POA: Diagnosis not present

## 2024-04-16 MED ORDER — TETANUS-DIPHTH-ACELL PERTUSSIS 5-2.5-18.5 LF-MCG/0.5 IM SUSY
0.5000 mL | PREFILLED_SYRINGE | Freq: Once | INTRAMUSCULAR | Status: AC
  Administered 2024-04-16: 0.5 mL via INTRAMUSCULAR

## 2024-04-16 MED ORDER — DOXYCYCLINE HYCLATE 100 MG PO CAPS: 100.0000 mg | ORAL_CAPSULE | Freq: Two times a day (BID) | ORAL | 0 refills | Status: DC
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
## 2024-04-16 NOTE — Discharge Instructions (Addendum)
 Keep area clean and dry.  Start doxycycline  twice daily for 10 days.  Please follow-up with your PCP in 2 days for recheck.  Please go to the ER if you develop any worsening symptoms.  This includes but is not limited to fever, worsening pain or swelling of the wound, or any new concerns that arise.  Hope you feel better soon!

## 2024-04-16 NOTE — ED Triage Notes (Addendum)
 Pt present with wound to the rt leg. Pt was getting on a truck, her foot slipped and peeled some skin off of her rt leg. Pt states this happened on 05/09. Pt is currently on blood thinners.

## 2024-04-19 DIAGNOSIS — N2581 Secondary hyperparathyroidism of renal origin: Secondary | ICD-10-CM | POA: Diagnosis not present

## 2024-04-19 DIAGNOSIS — N186 End stage renal disease: Secondary | ICD-10-CM | POA: Diagnosis not present

## 2024-04-19 DIAGNOSIS — L0390 Cellulitis, unspecified: Secondary | ICD-10-CM | POA: Diagnosis not present

## 2024-04-19 DIAGNOSIS — Z992 Dependence on renal dialysis: Secondary | ICD-10-CM | POA: Diagnosis not present

## 2024-04-26 DIAGNOSIS — D631 Anemia in chronic kidney disease: Secondary | ICD-10-CM | POA: Diagnosis not present

## 2024-04-26 DIAGNOSIS — N186 End stage renal disease: Secondary | ICD-10-CM | POA: Diagnosis not present

## 2024-04-26 DIAGNOSIS — N2581 Secondary hyperparathyroidism of renal origin: Secondary | ICD-10-CM | POA: Diagnosis not present

## 2024-04-26 DIAGNOSIS — Z992 Dependence on renal dialysis: Secondary | ICD-10-CM | POA: Diagnosis not present

## 2024-05-01 DIAGNOSIS — N186 End stage renal disease: Secondary | ICD-10-CM | POA: Diagnosis not present

## 2024-05-01 DIAGNOSIS — E1122 Type 2 diabetes mellitus with diabetic chronic kidney disease: Secondary | ICD-10-CM | POA: Diagnosis not present

## 2024-05-01 DIAGNOSIS — Z992 Dependence on renal dialysis: Secondary | ICD-10-CM | POA: Diagnosis not present

## 2024-05-03 DIAGNOSIS — N2581 Secondary hyperparathyroidism of renal origin: Secondary | ICD-10-CM | POA: Diagnosis not present

## 2024-05-03 DIAGNOSIS — N186 End stage renal disease: Secondary | ICD-10-CM | POA: Diagnosis not present

## 2024-05-03 DIAGNOSIS — Z992 Dependence on renal dialysis: Secondary | ICD-10-CM | POA: Diagnosis not present

## 2024-05-05 ENCOUNTER — Telehealth: Payer: Self-pay

## 2024-05-05 ENCOUNTER — Ambulatory Visit
Admission: EM | Admit: 2024-05-05 | Discharge: 2024-05-05 | Disposition: A | Discharge status: Home / Self Care | Attending: Family Medicine | Admitting: Family Medicine

## 2024-05-05 DIAGNOSIS — S81801D Unspecified open wound, right lower leg, subsequent encounter: Secondary | ICD-10-CM | POA: Diagnosis not present

## 2024-05-05 MED ORDER — MUPIROCIN 2 % EX OINT: 1.0000 | TOPICAL_OINTMENT | Freq: Two times a day (BID) | CUTANEOUS | 0 refills | Status: AC

## 2024-05-05 MED ORDER — DOXYCYCLINE HYCLATE 100 MG PO CAPS: 100.0000 mg | ORAL_CAPSULE | Freq: Two times a day (BID) | ORAL | 0 refills | Status: AC

## 2024-05-05 NOTE — Discharge Instructions (Addendum)
 Please keep wound clean and dry.  Apply the topical mupirocin  antibiotic ointment twice daily for the next 7 days.  Please take the doxycycline  twice daily as well for 7 days.  Please follow-up with your PCP in 2 to 3 days for recheck and to ensure that the wound is healing appropriately and that you do not need wound care.  Please go to the ER if you develop any worsening symptoms.  Hope you feel better soon!

## 2024-05-05 NOTE — Telephone Encounter (Signed)
 Left message confirming appt for 6/5
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
## 2024-05-05 NOTE — ED Triage Notes (Signed)
 Pt states she has a wound to her right shin that is not healing and is painful. Pt states the wound looks worse.  Open wound noted with some pus. States she was seen her for the same on 04/16/2024.

## 2024-05-06 ENCOUNTER — Inpatient Hospital Stay: Payer: Medicare Other | Attending: Hematology and Oncology | Admitting: Hematology and Oncology

## 2024-05-06 VITALS — BP 150/57 | HR 73 | Temp 98.5°F | Resp 18 | Wt 236.8 lb

## 2024-05-06 DIAGNOSIS — Z79811 Long term (current) use of aromatase inhibitors: Secondary | ICD-10-CM | POA: Diagnosis not present

## 2024-05-06 DIAGNOSIS — N186 End stage renal disease: Secondary | ICD-10-CM | POA: Diagnosis not present

## 2024-05-06 DIAGNOSIS — Z992 Dependence on renal dialysis: Secondary | ICD-10-CM | POA: Insufficient documentation

## 2024-05-06 DIAGNOSIS — Z923 Personal history of irradiation: Secondary | ICD-10-CM | POA: Diagnosis not present

## 2024-05-06 DIAGNOSIS — Z1722 Progesterone receptor negative status: Secondary | ICD-10-CM | POA: Insufficient documentation

## 2024-05-06 DIAGNOSIS — C50811 Malignant neoplasm of overlapping sites of right female breast: Secondary | ICD-10-CM | POA: Insufficient documentation

## 2024-05-06 DIAGNOSIS — Z170 Estrogen receptor positive status [ER+]: Secondary | ICD-10-CM | POA: Insufficient documentation

## 2024-05-06 MED ORDER — ANASTROZOLE 1 MG PO TABS: 1.0000 mg | ORAL_TABLET | Freq: Every day | ORAL | 3 refills | Status: AC

## 2024-05-06 NOTE — Progress Notes (Signed)
 [facility] CONSULT NOTE  Patient Care Team: Elester Grim, MD as PCP - General (Internal Medicine) Jacqueline Matsu, MD as PCP - Cardiology (Cardiology) Verlena Glenn, MD (Nephrology) Murleen Arms, MD as Consulting Physician (Hematology and Oncology) [facility] Caralyn Chandler, MD as Consulting Physician (General Surgery)  CHIEF COMPLAINTS/PURPOSE OF CONSULTATION:  Newly diagnosed breast cancer  HISTORY OF PRESENTING ILLNESS:  Brenda Arnold 68 y.o. female is here because of recent diagnosis of right breast cancer  I reviewed her records extensively and collaborated the history with the patient.  SUMMARY OF ONCOLOGIC HISTORY: Oncology History  Malignant neoplasm of overlapping sites of right female breast (HCC)  03/21/2023 Mammogram   Diagnostic mammogram showed suspicious mass in the right breast at 6:00 measuring 1.3 cm, at the palpable site of concern in the left axilla there benign sebaceous or epidermal inclusion cyst.  No mammographic evidence of malignancy in the left breast.   04/04/2023 Pathology Results   Right breast needle core biopsy showed invasive ductal carcinoma overall grade 1, prognostic showed ER 100% positive strong staining PR negative HER2 0 and Ki-67 of 10%   04/24/2023 Initial Diagnosis   Malignant neoplasm of overlapping sites of right female breast (HCC)   04/24/2023 Cancer Staging   Staging form: Breast, AJCC 8th Edition - Clinical stage from 04/24/2023: Stage IA (cT1c, cN0, cM0, G1, ER+, PR-, HER2-) - Signed by Bettejane Brownie, PA-C on 04/24/2023 Stage prefix: Initial diagnosis Method of lymph node assessment: Clinical Histologic grading system: 3 grade system   05/08/2023 Surgery   Right lumpectomy: IDC, 1.4 cm, grade 1, margins negative, 0 LN evaluated   05/08/2023 Cancer Staging   Staging form: Breast, AJCC 8th Edition - Pathologic stage from 05/08/2023: Stage IA (pT1c, pN0, cM0, G1, ER+, PR-, HER2-) - Signed  by Percival Brace, NP on 11/06/2023 Stage prefix: Initial diagnosis Histologic grading system: 3 grade system   05/08/2023 Oncotype testing   14/4%   06/19/2023 - 07/17/2023 Radiation Therapy   Plan Name: Breast_R Site: Breast, Right Technique: 3D Mode: Photon Dose Per Fraction: 2.66 Gy Prescribed Dose (Delivered / Prescribed): 42.56 Gy / 42.56 Gy Prescribed Fxs (Delivered / Prescribed): 16 / 16   Plan Name: Breast_R_Bst Site: Breast, Right Technique: 3D Mode: Photon Dose Per Fraction: 2 Gy Prescribed Dose (Delivered / Prescribed): 8 Gy / 8 Gy Prescribed Fxs (Delivered / Prescribed): 4 / 4   08/2023 -  Anti-estrogen oral therapy   Anastrozole      Discussed the use of AI scribe software for clinical note transcription with the patient, who gave verbal consent to proceed.  History of Present Illness Brenda Arnold is a 68 year old female with breast cancer who presents for routine oncology follow-up. She reports no significant changes since her last visit and describes her condition as 'same old, same old.' She has been taking anastrozole  regularly at night without experiencing any side effects, as she sleeps through the potential effects. No new breast changes or problems are noted. Her last bone density test in April 2024 was normal, and she is due for a mammogram, which was ordered for April 2025 but has not yet been scheduled.  She mentions a recent incident where she scraped her leg on a running board of a vehicle, resulting in a wound that is at least two layers deep and painful.  She attends dialysis sessions at a center and reports that it is going 'pretty good.'  She inquires about  the timing of her anastrozole  intake, which she usually takes before bed, and confirms that it is effective for 24 hours.  No new breast changes or problems.  Rest of the pertinent 10 point ROS reviewed and neg.  MEDICAL HISTORY:  Past Medical History:  Diagnosis Date    Agatston coronary artery calcium  score greater than 400    Ca score 3824 on CT 08/2021   Anemia    Arthritis    Asthma    Breast cancer (HCC) 04/04/2023   Complication of anesthesia    difficulty with getting oxygen saturation up- "patient was not aware"  Bottom of feet burning in PACU   Constipation    because of Iron    Diabetes mellitus    not on meds   DVT (deep venous thrombosis) (HCC) 2019   post hip replacement - right   Dyslipidemia    Epistaxis 11/05/2012   ESRD (end stage renal disease) on dialysis [facility])    "MWF; [facility]" (09/15/2018)- started 02/13/2017   History of blood transfusion    HTN (hypertension)    pt denies this. She states her "BP runs low now"   Hyperparathyroidism due to renal insufficiency (HCC)    Hypotension    Neuromuscular disorder (HCC)    Neuropathy    Obesity    s/p panniculectomy   Paroxysmal A-fib (HCC)    a. chronic coumadin ;  b. 12/2009 Echo: EF 60-65%, Gr 1 DD.   PCOS (polycystic ovarian syndrome)    Pericarditis 08/2019   pericarditis with pericardial effusion, s/p right VATS/pericardial window 08/31/19   Pneumonia    Seasonal allergies    Sleep apnea    a. not using CPAP, last study  >8 yrs   Vitamin D  deficiency     SURGICAL HISTORY: Past Surgical History:  Procedure Laterality Date   A/V FISTULAGRAM N/A 01/01/2024   Procedure: A/V Fistulagram;  Surgeon: Patrick Boor, MD;  Location: [facility] INVASIVE CV LAB;  Service: Cardiovascular;  Laterality: N/A;   A/V SHUNT INTERVENTION N/A 04/13/2024   Procedure: A/V SHUNT INTERVENTION;  Surgeon: Melodie Spry, MD;  Location: [facility] INVASIVE CV LAB;  Service: Cardiovascular;  Laterality: N/A;   ABDOMINAL HYSTERECTOMY     with panniculectomy   AV FISTULA PLACEMENT  09/02/2012   Procedure: ARTERIOVENOUS (AV) FISTULA CREATION;  Surgeon: Richrd Char, MD;  Location: [facility] OR;  Service: Vascular;  Laterality: Left;  Creation of Left Radial-Cephalic Fistula    BREAST BIOPSY Right 04/04/2023   US  RT  BREAST BX W LOC DEV 1ST LESION IMG BX SPEC US  GUIDE 04/04/2023 [facility] MAMMOGRAPHY   BREAST BIOPSY  05/06/2023   US  RT RADIOACTIVE SEED LOC 05/06/2023 [facility] MAMMOGRAPHY   BREAST LUMPECTOMY WITH RADIOACTIVE SEED LOCALIZATION Right 05/08/2023   Procedure: RIGHT BREAST LUMPECTOMY WITH RADIOACTIVE SEED LOCALIZATION;  Surgeon: Caralyn Chandler, MD;  Location: [facility] OR;  Service: General;  Laterality: Right;   COLONOSCOPY     COLONOSCOPY W/ BIOPSIES AND POLYPECTOMY     DILATION AND CURETTAGE OF UTERUS     FISTULA SUPERFICIALIZATION Left 09/23/2023   Procedure: PLICATION OF LEFT ARM FISTULA;  Surgeon: Kayla Part, MD;  Location: [facility] OR;  Service: Vascular;  Laterality: Left;   KNEE ARTHROSCOPY Right    PANNICULECTOMY     REVISON OF ARTERIOVENOUS FISTULA Left 04/27/2014   Procedure: REVISON OF LEFT RADIAL-CEPHALIC ARTERIOVENOUS FISTULA;  Surgeon: Palma Bob, MD;  Location: [facility] OR;  Service: Vascular;  Laterality: Left;   REVISON OF ARTERIOVENOUS  FISTULA Left 01/13/2020   Procedure: REVISON OF ARTERIOVENOUS FISTULA;  Surgeon: Young Hensen, MD;  Location: [facility] OR;  Service: Vascular;  Laterality: Left;   REVISON OF ARTERIOVENOUS FISTULA Left 08/11/2023   Procedure: PLICATION OF LEFT ARM ARTERIOVENOUS FISTULA;  Surgeon: Philipp Brawn, MD;  Location: [facility] OR;  Service: Vascular;  Laterality: Left;   TOTAL HIP ARTHROPLASTY Right 09/15/2018   TOTAL HIP ARTHROPLASTY Right 09/15/2018   Procedure: RIGHT TOTAL HIP ARTHROPLASTY ANTERIOR APPROACH;  Surgeon: Wes Hamman, MD;  Location: [facility] OR;  Service: Orthopedics;  Laterality: Right;   TOTAL HIP ARTHROPLASTY Left 11/23/2020   Procedure: LEFT TOTAL HIP ARTHROPLASTY ANTERIOR APPROACH;  Surgeon: Wes Hamman, MD;  Location: [facility] OR;  Service: Orthopedics;  Laterality: Left;  NEEDS RNFA PLEASE   UVULOPLASTY     VIDEO ASSISTED THORACOSCOPY (VATS)/WEDGE RESECTION Right 08/31/2019   Procedure: VIDEO ASSISTED THORACOSCOPY/ DRAINAGE OF PERICARDIAL EFFUSION/ PERICARDIAL  WINDOW/ ABORTED PERICARDIOCENTESIS ;  Surgeon: Hilarie Lovely, MD;  Location: [facility] OR;  Service: Thoracic;  Laterality: Right;    SOCIAL HISTORY: Social History   Socioeconomic History   Marital status: Single    Spouse name: Not on file   Number of children: 0   Years of education: Not on file   Highest education level: Not on file  Occupational History   Occupation: CORRESPONDENT ADMIN.    Employer: [employer]  Tobacco Use   Smoking status: Never   Smokeless tobacco: Never  Vaping Use   Vaping status: Never Used  Substance and Sexual Activity   Alcohol use: No    Alcohol/week: 0.0 standard drinks of alcohol   Drug use: No   Sexual activity: Not on file    Comment: Hysterectomy  Other Topics Concern   Not on file  Social History Narrative   Lives in [place] alone. 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   Dialysis M-W-F    Social Drivers of Health   Financial Resource Strain: Not on file  Food Insecurity: No Food Insecurity (09/23/2023)   Hunger Vital Sign    Worried About Running Out of Food in the Last Year: Never true    Ran Out of Food in the Last Year: Never true  Transportation Needs: No Transportation Needs (09/23/2023)   PRAPARE - Administrator, Civil Service (Medical): No    Lack of Transportation (Non-Medical): No  Physical Activity: Not on file  Stress: Not on file  Social Connections: Not on file  Intimate Partner Violence: Not At Risk (09/23/2023)   Humiliation, Afraid, Rape, and Kick questionnaire    Fear of Current or Ex-Partner: No    Emotionally Abused: No    Physically Abused: No    Sexually Abused: No    FAMILY HISTORY: Family History  Problem Relation Age of Onset   Lung cancer Father        died @ 1   Hypertension Mother    Diabetes Brother    Kidney disease Brother     ALLERGIES:  is allergic to iodine , shellfish allergy, amlodipine, felodipine, lisinopril, and  chlorhexidine .  MEDICATIONS:  Current Outpatient Medications  Medication Sig Dispense Refill   acetaminophen  (TYLENOL ) 500 MG tablet Take 2 tablets (1,000 mg total) by mouth every 8 (eight) hours as needed for mild pain. (Patient taking differently: Take 500 mg by mouth every 8 (eight) hours as needed for mild pain (pain score 1-3).)     acetaminophen  (TYLENOL ) 650 MG CR tablet Take  650 mg by mouth every 8 (eight) hours as needed for pain.     amiodarone  (PACERONE ) 200 MG tablet TAKE 1 TABLET BY MOUTH EVERY DAY 90 tablet 2   anastrozole  (ARIMIDEX ) 1 MG tablet Take 1 tablet (1 mg total) by mouth daily. 90 tablet 3   atorvastatin  (LIPITOR) 40 MG tablet TAKE 1 TABLET BY MOUTH EVERY DAY 90 tablet 3   B Complex-C-Folic Acid  (DIALYVITE  TABLET) TABS Take 1 tablet by mouth at bedtime.     bisacodyl  (DULCOLAX) 5 MG EC tablet Take 1 tablet (5 mg total) by mouth daily as needed for moderate constipation.     ciprofloxacin-dexamethasone  (CIPRODEX) OTIC suspension Place 4 drops into both ears 2 (two) times daily as needed (ear wax build up).     cyclobenzaprine (FLEXERIL) 10 MG tablet Take 10 mg by mouth at bedtime as needed for muscle spasms.     docusate sodium  (COLACE) 100 MG capsule Take 100 mg by mouth daily as needed for mild constipation or moderate constipation.     doxycycline  (VIBRAMYCIN ) 100 MG capsule Take 1 capsule (100 mg total) by mouth 2 (two) times daily for 7 days. 14 capsule 0   ELIQUIS  5 MG TABS tablet Take 1 tablet (5 mg total) by mouth 2 (two) times daily. 180 tablet 3   ethyl chloride spray Apply 1 application  topically every Monday, Wednesday, and Friday with hemodialysis.     gabapentin  (NEURONTIN ) 100 MG capsule Take 100 mg by mouth at bedtime.  3   iron  sucrose in sodium chloride  0.9 % 100 mL Inject 50 mg into the vein.     loratadine  (CLARITIN ) 10 MG tablet Take 1 tablet (10 mg total) by mouth daily. (Patient taking differently: Take 10 mg by mouth daily as needed for allergies.)  30 tablet 0   Methoxy PEG-Epoetin  Beta (MIRCERA IJ) 50 mcg.     midodrine  (PROAMATINE ) 10 MG tablet Take 1 tablet (10 mg total) by mouth 3 (three) times daily with meals.     mupirocin  ointment (BACTROBAN ) 2 % Apply 1 Application topically 2 (two) times daily for 7 days. 22 g 0   oxyCODONE -acetaminophen  (PERCOCET) 5-325 MG tablet Take 1 tablet by mouth every 6 (six) hours as needed for severe pain (pain score 7-10) or moderate pain (pain score 4-6). 15 tablet 0   sucroferric oxyhydroxide (VELPHORO ) 500 MG chewable tablet Chew 2 tablets (1,000 mg total) by mouth 3 (three) times daily with meals. (Patient taking differently: Chew 500-1,000 mg by mouth See admin instructions. Take 1000 mg with meals and 500 mg with snacks) 180 tablet 0   No current facility-administered medications for this visit.    REVIEW OF SYSTEMS:   Constitutional: Denies fevers, chills or abnormal night sweats Eyes: Denies blurriness of vision, double vision or watery eyes Ears, nose, mouth, throat, and face: Denies mucositis or sore throat Respiratory: Denies cough, dyspnea or wheezes Cardiovascular: Denies palpitation, chest discomfort or lower extremity swelling Gastrointestinal:  Denies nausea, heartburn or change in bowel habits Skin: Denies abnormal skin rashes Lymphatics: Denies new lymphadenopathy or easy bruising Neurological:Denies numbness, tingling or new weaknesses Behavioral/Psych: Mood is stable, no new changes  Breast: Felt left axilla lumps not the right breast. All other systems were reviewed with the patient and are negative.  PHYSICAL EXAMINATION: ECOG PERFORMANCE STATUS: 1 - Symptomatic but completely ambulatory  Vitals:   05/06/24 0945 05/06/24 0946  BP: (!) 141/51 (!) 150/57  Pulse: 73   Resp: 18   Temp: 98.5  F (36.9 C)   SpO2: 100%    Filed Weights   05/06/24 0945  Weight: 236 lb 12.8 oz (107.4 kg)    GENERAL:alert, no distress and comfortable Bilateral breast exam: No concerns,  palpable masses or regional adenopathy No LE edema  LABORATORY DATA:  I have reviewed the data as listed Lab Results  Component Value Date   WBC 7.8 11/06/2023   HGB 10.7 (L) 11/06/2023   HCT 33.6 (L) 11/06/2023   MCV 99.7 11/06/2023   PLT 167 11/06/2023   Lab Results  Component Value Date   NA 143 11/06/2023   K 4.4 11/06/2023   CL 97 (L) 11/06/2023   CO2 36 (H) 11/06/2023    RADIOGRAPHIC STUDIES: I have personally reviewed the radiological reports and agreed with the findings in the report.  ASSESSMENT AND PLAN:  Malignant neoplasm of overlapping sites of right female breast [facility]) This is a very pleasant 68 year old postmenopausal female patient with past medical history significant for hypertension, end-stage renal disease, A-fib on anticoagulation, history of DVT, diabetes mellitus referred to medical oncology for new diagnosis of right breast invasive ductal carcinoma.  Given small size, node-negative and strong ER staining, she will proceed with upfront surgery followed by Oncotype Dx testing.  She has completed surgery, healing very well. Oncotype dx of 14, no role for adjuvant chemotherapy. She then underwent adj radiation and is now on adj anastrozole  Baseline bone density April 2024, normal.  Assessment and Plan Assessment & Plan Breast cancer Breast cancer well-managed with anastrozole , no side effects. Last bone density normal. - Instructed her to schedule mammogram. - Continue anastrozole . - Reassess bone density next year.  Leg wound Sustained a deep, painful leg wound from a fall. Dressing noted.  End-stage renal disease on dialysis Managed with dialysis, no new issues reported.      Total time: 30 min All questions were answered. The patient knows to call the clinic with any problems, questions or concerns.    Murleen Arms, MD 05/06/24

## 2024-05-06 NOTE — Assessment & Plan Note (Addendum)
 This is a very pleasant 68 year old postmenopausal female patient with past medical history significant for hypertension, end-stage renal disease, A-fib on anticoagulation, history of DVT, diabetes mellitus referred to medical oncology for new diagnosis of right breast invasive ductal carcinoma.  Given small size, node-negative and strong ER staining, she will proceed with upfront surgery followed by Oncotype Dx testing.  She has completed surgery, healing very well. Oncotype dx of 14, no role for adjuvant chemotherapy. She then underwent adj radiation and is now on adj anastrozole  Baseline bone density April 2024, normal.  Assessment and Plan Assessment & Plan Breast cancer Breast cancer well-managed with anastrozole , no side effects. Last bone density normal. - Instructed her to schedule mammogram. - Continue anastrozole . - Reassess bone density next year.  Leg wound Sustained a deep, painful leg wound from a fall. Dressing noted.  End-stage renal disease on dialysis Managed with dialysis, no new issues reported.

## 2024-05-10 DIAGNOSIS — D631 Anemia in chronic kidney disease: Secondary | ICD-10-CM | POA: Diagnosis not present

## 2024-05-10 DIAGNOSIS — N2581 Secondary hyperparathyroidism of renal origin: Secondary | ICD-10-CM | POA: Diagnosis not present

## 2024-05-10 DIAGNOSIS — N186 End stage renal disease: Secondary | ICD-10-CM | POA: Diagnosis not present

## 2024-05-10 DIAGNOSIS — Z992 Dependence on renal dialysis: Secondary | ICD-10-CM | POA: Diagnosis not present

## 2024-05-17 DIAGNOSIS — D849 Immunodeficiency, unspecified: Secondary | ICD-10-CM | POA: Diagnosis not present

## 2024-05-17 DIAGNOSIS — I132 Hypertensive heart and chronic kidney disease with heart failure and with stage 5 chronic kidney disease, or end stage renal disease: Secondary | ICD-10-CM | POA: Diagnosis not present

## 2024-05-17 DIAGNOSIS — N2581 Secondary hyperparathyroidism of renal origin: Secondary | ICD-10-CM | POA: Diagnosis not present

## 2024-05-17 DIAGNOSIS — L03115 Cellulitis of right lower limb: Secondary | ICD-10-CM | POA: Diagnosis not present

## 2024-05-17 DIAGNOSIS — E1169 Type 2 diabetes mellitus with other specified complication: Secondary | ICD-10-CM | POA: Diagnosis not present

## 2024-05-17 DIAGNOSIS — N186 End stage renal disease: Secondary | ICD-10-CM | POA: Diagnosis not present

## 2024-05-17 DIAGNOSIS — C50919 Malignant neoplasm of unspecified site of unspecified female breast: Secondary | ICD-10-CM | POA: Diagnosis not present

## 2024-05-17 DIAGNOSIS — I4891 Unspecified atrial fibrillation: Secondary | ICD-10-CM | POA: Diagnosis not present

## 2024-05-17 DIAGNOSIS — Z992 Dependence on renal dialysis: Secondary | ICD-10-CM | POA: Diagnosis not present

## 2024-05-17 DIAGNOSIS — T798XXA Other early complications of trauma, initial encounter: Secondary | ICD-10-CM | POA: Diagnosis not present

## 2024-05-17 NOTE — Progress Notes (Unsigned)
 Patient name: Brenda Arnold MRN: [id number] DOB: February 03, 1956 Sex: female  REASON FOR CONSULT: Non-healing skin tear right shin  HPI: Brenda Arnold is a 68 y.o. female, with history of diabetes, ESRD, A-fib on Coumadin , hypertension that presents for evaluation of nonhealing skin tear to the right shin.  She is well-known to vascular surgery for previous dialysis access.  Past Medical History:  Diagnosis Date   Agatston coronary artery calcium  score greater than 400    Ca score 3824 on CT 08/2021   Anemia    Arthritis    Asthma    Breast cancer (HCC) 04/04/2023   Complication of anesthesia    difficulty with getting oxygen saturation up- patient was not aware  Bottom of feet burning in PACU   Constipation    because of Iron    Diabetes mellitus    not on meds   DVT (deep venous thrombosis) (HCC) 2019   post hip replacement - right   Dyslipidemia    Epistaxis 11/05/2012   ESRD (end stage renal disease) on dialysis (HCC)    MWF; [address] (09/15/2018)- started 02/13/2017   History of blood transfusion    HTN (hypertension)    pt denies this. She states her BP runs low now   Hyperparathyroidism due to renal insufficiency (HCC)    Hypotension    Neuromuscular disorder (HCC)    Neuropathy    Obesity    s/p panniculectomy   Paroxysmal A-fib (HCC)    a. chronic coumadin ;  b. 12/2009 Echo: EF 60-65%, Gr 1 DD.   PCOS (polycystic ovarian syndrome)    Pericarditis 08/2019   pericarditis with pericardial effusion, s/p right VATS/pericardial window 08/31/19   Pneumonia    Seasonal allergies    Sleep apnea    a. not using CPAP, last study  >8 yrs   Vitamin D  deficiency     Past Surgical History:  Procedure Laterality Date   A/V FISTULAGRAM N/A 01/01/2024   Procedure: A/V Fistulagram;  Surgeon: Patrick Boor, MD;  Location: [facility] INVASIVE [facility];  Service: Cardiovascular;  Laterality: N/A;   A/V SHUNT INTERVENTION N/A 04/13/2024   Procedure: A/V SHUNT INTERVENTION;   Surgeon: Melodie Spry, MD;  Location[facility] INV[facility] CV LAB;  Service: Cardiovascular;  Laterality: N/A;   ABDOMINAL HYSTERECTOMY     with panniculectomy   AV FISTULA PLACEMENT  09/02/2012   Procedure: ARTERIOVENOUS (AV) FISTULA CREATION;  Surgeon: Richrd Char, MD;  Location: [facility] OR;  Service: Vascular;  Laterality: Left;  Creation of Left Radial-Cephalic Fistula    BREAST BIOPSY Right 04/04/2023   US  RT BREAST BX W LOC DEV 1ST LESION IMG BX SPEC US  GUIDE 04/04/2023 [facility]   BREAST BIOPSY  05/06/2023   US  RT RADIOACTIVE SEED LOC 05/06/2023 [facility]   BREAST LUMPECTOMY WITH RADIOACTIVE SEED LOCALIZATION Right 05/08/2023   Procedure: RIGHT BREAST LUMPECTOMY WITH RADIOACTIVE SEED LOCALIZATION;  Surgeon: Caralyn Chandler, MD;  Location: [facility] OR;  Service: General;  Laterality: Right;   COLONOSCOPY     COLONOSCOPY W/ BIOPSIES AND POLYPECTOMY     DILATION AND CURETTAGE OF UTERUS     FISTULA SUPERFICIALIZATION Left 09/23/2023   Procedure: PLICATION OF LEFT ARM FISTULA;  Surgeon: Kayla Part, MD;  Location: [facility] OR;  Service: Vascular;  Laterality: Left;   KNEE ARTHROSCOPY Right    PANNICULECTOMY     REVISON OF ARTERIOVENOUS FISTULA Left 04/27/2014   Procedure: REVISON OF LEFT RADIAL-CEPHALIC ARTERIOVENOUS FISTULA;  Surgeon:  Palma Bob, MD;  Location: [facility] OR;  Service: Vascular;  Laterality: Left;   REVISON OF ARTERIOVENOUS FISTULA Left 01/13/2020   Procedure: REVISON OF ARTERIOVENOUS FISTULA;  Surgeon: Young Hensen, MD;  Location: [facility] OR;  Service: Vascular;  Laterality: Left;   REVISON OF ARTERIOVENOUS FISTULA Left 08/11/2023   Procedure: PLICATION OF LEFT ARM ARTERIOVENOUS FISTULA;  Surgeon: Philipp Brawn, MD;  Location: [facility] OR;  Service: Vascular;  Laterality: Left;   TOTAL HIP ARTHROPLASTY Right 09/15/2018   TOTAL HIP ARTHROPLASTY Right 09/15/2018   Procedure: RIGHT TOTAL HIP ARTHROPLASTY ANTERIOR APPROACH;  Surgeon: Wes Hamman, MD;  Location: [facility] OR;  Service:  Orthopedics;  Laterality: Right;   TOTAL HIP ARTHROPLASTY Left 11/23/2020   Procedure: LEFT TOTAL HIP ARTHROPLASTY ANTERIOR APPROACH;  Surgeon: Wes Hamman, MD;  Location: [facility] OR;  Service: Orthopedics;  Laterality: Left;  NEEDS RNFA PLEASE   UVULOPLASTY     VIDEO ASSISTED THORACOSCOPY (VATS)/WEDGE RESECTION Right 08/31/2019   Procedure: VIDEO ASSISTED THORACOSCOPY/ DRAINAGE OF PERICARDIAL EFFUSION/ PERICARDIAL WINDOW/ ABORTED PERICARDIOCENTESIS ;  Surgeon: Hilarie Lovely, MD;  Location: [facility] OR;  Service: Thoracic;  Laterality: Right;    Family History  Problem Relation Age of Onset   Lung cancer Father        died @ 52   Hypertension Mother    Diabetes Brother    Kidney disease Brother     SOCIAL HISTORY: Social History   Socioeconomic History   Marital status: Single    Spouse name: Not on file   Number of children: 0   Years of education: Not on file   Highest education level: Not on file  Occupational History   Occupation: CORRESPONDENT ADMIN.    Employer: [employer]  Tobacco Use   Smoking status: Never   Smokeless tobacco: Never  Vaping Use   Vaping status: Never Used  Substance and Sexual Activity   Alcohol use: No    Alcohol/week: 0.0 standard drinks of alcohol   Drug use: No   Sexual activity: Not on file    Comment: Hysterectomy  Other Topics Concern   Not on file  Social History Narrative   Lives in [place] alone. 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   Dialysis M-W-F    Social Drivers of Health   Financial Resource Strain: Not on file  Food Insecurity: No Food Insecurity (09/23/2023)   Hunger Vital Sign    Worried About Running Out of Food in the Last Year: Never true    Ran Out of Food in the Last Year: Never true  Transportation Needs: No Transportation Needs (09/23/2023)   PRAPARE - Administrator, Civil Service (Medical): No    Lack of Transportation (Non-Medical): No  Physical Activity: Not on  file  Stress: Not on file  Social Connections: Not on file  Intimate Partner Violence: Not At Risk (09/23/2023)   Humiliation, Afraid, Rape, and Kick questionnaire    Fear of Current or Ex-Partner: No    Emotionally Abused: No    Physically Abused: No    Sexually Abused: No    Allergies  Allergen Reactions   Iodine  Shortness Of Breath   Shellfish Allergy Shortness Of Breath    Breathing Problems   Amlodipine Swelling   Felodipine Swelling    Headache   Lisinopril Cough   Chlorhexidine  Itching    CHG wipes    Current Outpatient Medications  Medication Sig Dispense Refill   acetaminophen  (TYLENOL )  500 MG tablet Take 2 tablets (1,000 mg total) by mouth every 8 (eight) hours as needed for mild pain. (Patient taking differently: Take 500 mg by mouth every 8 (eight) hours as needed for mild pain (pain score 1-3).)     acetaminophen  (TYLENOL ) 650 MG CR tablet Take 650 mg by mouth every 8 (eight) hours as needed for pain.     amiodarone  (PACERONE ) 200 MG tablet TAKE 1 TABLET BY MOUTH EVERY DAY 90 tablet 2   anastrozole  (ARIMIDEX ) 1 MG tablet Take 1 tablet (1 mg total) by mouth daily. 90 tablet 3   atorvastatin  (LIPITOR) 40 MG tablet TAKE 1 TABLET BY MOUTH EVERY DAY 90 tablet 3   B Complex-C-Folic Acid  (DIALYVITE  TABLET) TABS Take 1 tablet by mouth at bedtime.     bisacodyl  (DULCOLAX) 5 MG EC tablet Take 1 tablet (5 mg total) by mouth daily as needed for moderate constipation.     ciprofloxacin-dexamethasone  (CIPRODEX) OTIC suspension Place 4 drops into both ears 2 (two) times daily as needed (ear wax build up).     cyclobenzaprine (FLEXERIL) 10 MG tablet Take 10 mg by mouth at bedtime as needed for muscle spasms.     docusate sodium  (COLACE) 100 MG capsule Take 100 mg by mouth daily as needed for mild constipation or moderate constipation.     ELIQUIS  5 MG TABS tablet Take 1 tablet (5 mg total) by mouth 2 (two) times daily. 180 tablet 3   ethyl chloride spray Apply 1 application   topically every Monday, Wednesday, and Friday with hemodialysis.     gabapentin  (NEURONTIN ) 100 MG capsule Take 100 mg by mouth at bedtime.  3   iron  sucrose in sodium chloride  0.9 % 100 mL Inject 50 mg into the vein.     loratadine  (CLARITIN ) 10 MG tablet Take 1 tablet (10 mg total) by mouth daily. (Patient taking differently: Take 10 mg by mouth daily as needed for allergies.) 30 tablet 0   Methoxy PEG-Epoetin  Beta (MIRCERA IJ) 50 mcg.     midodrine  (PROAMATINE ) 10 MG tablet Take 1 tablet (10 mg total) by mouth 3 (three) times daily with meals.     oxyCODONE -acetaminophen  (PERCOCET) 5-325 MG tablet Take 1 tablet by mouth every 6 (six) hours as needed for severe pain (pain score 7-10) or moderate pain (pain score 4-6). 15 tablet 0   sucroferric oxyhydroxide (VELPHORO ) 500 MG chewable tablet Chew 2 tablets (1,000 mg total) by mouth 3 (three) times daily with meals. (Patient taking differently: Chew 500-1,000 mg by mouth See admin instructions. Take 1000 mg with meals and 500 mg with snacks) 180 tablet 0   No current facility-administered medications for this visit.    REVIEW OF SYSTEMS:  [X]  denotes positive finding, [ ]  denotes negative finding Cardiac  Comments:  Chest pain or chest pressure: ***   Shortness of breath upon exertion:    Short of breath when lying flat:    Irregular heart rhythm:        Vascular    Pain in calf, thigh, or hip brought on by ambulation:    Pain in feet at night that wakes you up from your sleep:     Blood clot in your veins:    Leg swelling:         Pulmonary    Oxygen at home:    Productive cough:     Wheezing:         Neurologic    Sudden weakness in arms or  legs:     Sudden numbness in arms or legs:     Sudden onset of difficulty speaking or slurred speech:    Temporary loss of vision in one eye:     Problems with dizziness:         Gastrointestinal    Blood in stool:     Vomited blood:         Genitourinary    Burning when urinating:      Blood in urine:        Psychiatric    Major depression:         Hematologic    Bleeding problems:    Problems with blood clotting too easily:        Skin    Rashes or ulcers:        Constitutional    Fever or chills:      PHYSICAL EXAM: There were no vitals filed for this visit.  GENERAL: The patient is a well-nourished female, in no acute distress. The vital signs are documented above. CARDIAC: There is a regular rate and rhythm.  VASCULAR: *** PULMONARY: There is good air exchange bilaterally without wheezing or rales. ABDOMEN: Soft and non-tender with normal pitched bowel sounds.  MUSCULOSKELETAL: There are no major deformities or cyanosis. NEUROLOGIC: No focal weakness or paresthesias are detected. SKIN: There are no ulcers or rashes noted. PSYCHIATRIC: The patient has a normal affect.  DATA:   ABI today  Assessment/Plan:  68 y.o. female, with history of diabetes, ESRD, A-fib on Coumadin , hypertension that presents for evaluation of nonhealing skin tear to the right shin.   Young Hensen, MD [facility] Office: [phone]

## 2024-05-18 ENCOUNTER — Ambulatory Visit: Attending: Vascular Surgery | Admitting: Vascular Surgery

## 2024-05-18 ENCOUNTER — Other Ambulatory Visit (HOSPITAL_COMMUNITY): Payer: Self-pay | Admitting: Physician Assistant

## 2024-05-18 ENCOUNTER — Ambulatory Visit (INDEPENDENT_AMBULATORY_CARE_PROVIDER_SITE_OTHER): Payer: PPO | Admitting: Podiatry

## 2024-05-18 ENCOUNTER — Ambulatory Visit (HOSPITAL_COMMUNITY)
Admission: RE | Admit: 2024-05-18 | Discharge: 2024-05-18 | Disposition: A | Discharge status: Home / Self Care | Source: Ambulatory Visit | Attending: Vascular Surgery | Admitting: Vascular Surgery

## 2024-05-18 ENCOUNTER — Encounter: Payer: Self-pay | Admitting: Vascular Surgery

## 2024-05-18 VITALS — BP 143/65 | HR 64 | Temp 97.2°F | Resp 18 | Ht 67.0 in | Wt 239.7 lb

## 2024-05-18 DIAGNOSIS — I739 Peripheral vascular disease, unspecified: Secondary | ICD-10-CM

## 2024-05-18 DIAGNOSIS — Z91198 Patient's noncompliance with other medical treatment and regimen for other reason: Secondary | ICD-10-CM

## 2024-05-18 LAB — VAS US ABI WITH/WO TBI

## 2024-05-20 ENCOUNTER — Other Ambulatory Visit: Payer: Self-pay | Admitting: *Deleted

## 2024-05-20 DIAGNOSIS — I739 Peripheral vascular disease, unspecified: Secondary | ICD-10-CM

## 2024-05-23 NOTE — Progress Notes (Signed)
 1. Failure to attend appointment with reason given   Appointment rescheduled.

## 2024-05-24 DIAGNOSIS — N2581 Secondary hyperparathyroidism of renal origin: Secondary | ICD-10-CM | POA: Diagnosis not present

## 2024-05-24 DIAGNOSIS — D631 Anemia in chronic kidney disease: Secondary | ICD-10-CM | POA: Diagnosis not present

## 2024-05-24 DIAGNOSIS — N186 End stage renal disease: Secondary | ICD-10-CM | POA: Diagnosis not present

## 2024-05-24 DIAGNOSIS — Z992 Dependence on renal dialysis: Secondary | ICD-10-CM | POA: Diagnosis not present

## 2024-05-31 DIAGNOSIS — N186 End stage renal disease: Secondary | ICD-10-CM | POA: Diagnosis not present

## 2024-05-31 DIAGNOSIS — Z992 Dependence on renal dialysis: Secondary | ICD-10-CM | POA: Diagnosis not present

## 2024-05-31 DIAGNOSIS — N2581 Secondary hyperparathyroidism of renal origin: Secondary | ICD-10-CM | POA: Diagnosis not present

## 2024-05-31 DIAGNOSIS — E1122 Type 2 diabetes mellitus with diabetic chronic kidney disease: Secondary | ICD-10-CM | POA: Diagnosis not present

## 2024-06-02 DIAGNOSIS — Z992 Dependence on renal dialysis: Secondary | ICD-10-CM | POA: Diagnosis not present

## 2024-06-02 DIAGNOSIS — N186 End stage renal disease: Secondary | ICD-10-CM | POA: Diagnosis not present

## 2024-06-02 DIAGNOSIS — N2581 Secondary hyperparathyroidism of renal origin: Secondary | ICD-10-CM | POA: Diagnosis not present

## 2024-06-03 DIAGNOSIS — I480 Paroxysmal atrial fibrillation: Secondary | ICD-10-CM | POA: Diagnosis not present

## 2024-06-03 DIAGNOSIS — N186 End stage renal disease: Secondary | ICD-10-CM | POA: Diagnosis not present

## 2024-06-03 DIAGNOSIS — S81801D Unspecified open wound, right lower leg, subsequent encounter: Secondary | ICD-10-CM | POA: Diagnosis not present

## 2024-06-03 DIAGNOSIS — E1121 Type 2 diabetes mellitus with diabetic nephropathy: Secondary | ICD-10-CM | POA: Diagnosis not present

## 2024-06-03 DIAGNOSIS — I132 Hypertensive heart and chronic kidney disease with heart failure and with stage 5 chronic kidney disease, or end stage renal disease: Secondary | ICD-10-CM | POA: Diagnosis not present

## 2024-06-03 DIAGNOSIS — M792 Neuralgia and neuritis, unspecified: Secondary | ICD-10-CM | POA: Diagnosis not present

## 2024-06-03 DIAGNOSIS — E7800 Pure hypercholesterolemia, unspecified: Secondary | ICD-10-CM | POA: Diagnosis not present

## 2024-06-03 DIAGNOSIS — Z853 Personal history of malignant neoplasm of breast: Secondary | ICD-10-CM | POA: Diagnosis not present

## 2024-06-03 DIAGNOSIS — I5032 Chronic diastolic (congestive) heart failure: Secondary | ICD-10-CM | POA: Diagnosis not present

## 2024-06-03 DIAGNOSIS — I739 Peripheral vascular disease, unspecified: Secondary | ICD-10-CM | POA: Diagnosis not present

## 2024-06-07 DIAGNOSIS — D631 Anemia in chronic kidney disease: Secondary | ICD-10-CM | POA: Diagnosis not present

## 2024-06-07 DIAGNOSIS — E1122 Type 2 diabetes mellitus with diabetic chronic kidney disease: Secondary | ICD-10-CM | POA: Diagnosis not present

## 2024-06-07 DIAGNOSIS — Z992 Dependence on renal dialysis: Secondary | ICD-10-CM | POA: Diagnosis not present

## 2024-06-07 DIAGNOSIS — N186 End stage renal disease: Secondary | ICD-10-CM | POA: Diagnosis not present

## 2024-06-07 DIAGNOSIS — N2581 Secondary hyperparathyroidism of renal origin: Secondary | ICD-10-CM | POA: Diagnosis not present

## 2024-06-14 DIAGNOSIS — Z992 Dependence on renal dialysis: Secondary | ICD-10-CM | POA: Diagnosis not present

## 2024-06-14 DIAGNOSIS — N186 End stage renal disease: Secondary | ICD-10-CM | POA: Diagnosis not present

## 2024-06-14 DIAGNOSIS — N2581 Secondary hyperparathyroidism of renal origin: Secondary | ICD-10-CM | POA: Diagnosis not present

## 2024-06-14 DIAGNOSIS — D631 Anemia in chronic kidney disease: Secondary | ICD-10-CM | POA: Diagnosis not present

## 2024-06-15 ENCOUNTER — Encounter (HOSPITAL_BASED_OUTPATIENT_CLINIC_OR_DEPARTMENT_OTHER): Attending: Internal Medicine | Admitting: Internal Medicine

## 2024-06-15 DIAGNOSIS — E11622 Type 2 diabetes mellitus with other skin ulcer: Secondary | ICD-10-CM | POA: Insufficient documentation

## 2024-06-15 DIAGNOSIS — I87311 Chronic venous hypertension (idiopathic) with ulcer of right lower extremity: Secondary | ICD-10-CM | POA: Diagnosis not present

## 2024-06-15 DIAGNOSIS — I120 Hypertensive chronic kidney disease with stage 5 chronic kidney disease or end stage renal disease: Secondary | ICD-10-CM | POA: Insufficient documentation

## 2024-06-15 DIAGNOSIS — E1122 Type 2 diabetes mellitus with diabetic chronic kidney disease: Secondary | ICD-10-CM | POA: Diagnosis not present

## 2024-06-15 DIAGNOSIS — L97812 Non-pressure chronic ulcer of other part of right lower leg with fat layer exposed: Secondary | ICD-10-CM | POA: Diagnosis not present

## 2024-06-15 DIAGNOSIS — N186 End stage renal disease: Secondary | ICD-10-CM | POA: Insufficient documentation

## 2024-06-15 DIAGNOSIS — I4891 Unspecified atrial fibrillation: Secondary | ICD-10-CM | POA: Diagnosis not present

## 2024-06-16 ENCOUNTER — Ambulatory Visit (INDEPENDENT_AMBULATORY_CARE_PROVIDER_SITE_OTHER): Admitting: Podiatry

## 2024-06-16 ENCOUNTER — Encounter: Payer: Self-pay | Admitting: Podiatry

## 2024-06-16 DIAGNOSIS — M2141 Flat foot [pes planus] (acquired), right foot: Secondary | ICD-10-CM

## 2024-06-16 DIAGNOSIS — S81801A Unspecified open wound, right lower leg, initial encounter: Secondary | ICD-10-CM

## 2024-06-16 DIAGNOSIS — L84 Corns and callosities: Secondary | ICD-10-CM

## 2024-06-16 DIAGNOSIS — E0822 Diabetes mellitus due to underlying condition with diabetic chronic kidney disease: Secondary | ICD-10-CM

## 2024-06-16 DIAGNOSIS — M79675 Pain in left toe(s): Secondary | ICD-10-CM | POA: Diagnosis not present

## 2024-06-16 DIAGNOSIS — N186 End stage renal disease: Secondary | ICD-10-CM

## 2024-06-16 DIAGNOSIS — M2142 Flat foot [pes planus] (acquired), left foot: Secondary | ICD-10-CM | POA: Diagnosis not present

## 2024-06-16 DIAGNOSIS — M79674 Pain in right toe(s): Secondary | ICD-10-CM | POA: Diagnosis not present

## 2024-06-16 DIAGNOSIS — E11622 Type 2 diabetes mellitus with other skin ulcer: Secondary | ICD-10-CM | POA: Diagnosis not present

## 2024-06-16 DIAGNOSIS — Z992 Dependence on renal dialysis: Secondary | ICD-10-CM

## 2024-06-16 DIAGNOSIS — B351 Tinea unguium: Secondary | ICD-10-CM | POA: Diagnosis not present

## 2024-06-16 DIAGNOSIS — E119 Type 2 diabetes mellitus without complications: Secondary | ICD-10-CM
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
## 2024-06-21 DIAGNOSIS — Z992 Dependence on renal dialysis: Secondary | ICD-10-CM | POA: Diagnosis not present

## 2024-06-21 DIAGNOSIS — D631 Anemia in chronic kidney disease: Secondary | ICD-10-CM | POA: Diagnosis not present

## 2024-06-21 DIAGNOSIS — N2581 Secondary hyperparathyroidism of renal origin: Secondary | ICD-10-CM | POA: Diagnosis not present

## 2024-06-21 DIAGNOSIS — N186 End stage renal disease: Secondary | ICD-10-CM | POA: Diagnosis not present

## 2024-06-22 ENCOUNTER — Ambulatory Visit (HOSPITAL_BASED_OUTPATIENT_CLINIC_OR_DEPARTMENT_OTHER): Admitting: Internal Medicine

## 2024-06-22 DIAGNOSIS — E11622 Type 2 diabetes mellitus with other skin ulcer: Secondary | ICD-10-CM

## 2024-06-22 DIAGNOSIS — I87311 Chronic venous hypertension (idiopathic) with ulcer of right lower extremity: Secondary | ICD-10-CM

## 2024-06-22 DIAGNOSIS — N186 End stage renal disease: Secondary | ICD-10-CM

## 2024-06-22 DIAGNOSIS — L97812 Non-pressure chronic ulcer of other part of right lower leg with fat layer exposed: Secondary | ICD-10-CM | POA: Diagnosis not present

## 2024-06-28 DIAGNOSIS — D631 Anemia in chronic kidney disease: Secondary | ICD-10-CM | POA: Diagnosis not present

## 2024-06-28 DIAGNOSIS — N2581 Secondary hyperparathyroidism of renal origin: Secondary | ICD-10-CM | POA: Diagnosis not present

## 2024-06-28 DIAGNOSIS — N186 End stage renal disease: Secondary | ICD-10-CM | POA: Diagnosis not present

## 2024-06-28 DIAGNOSIS — Z992 Dependence on renal dialysis: Secondary | ICD-10-CM | POA: Diagnosis not present

## 2024-06-29 ENCOUNTER — Ambulatory Visit (HOSPITAL_COMMUNITY): Attending: Internal Medicine

## 2024-06-29 ENCOUNTER — Ambulatory Visit: Admitting: Vascular Surgery

## 2024-06-29 ENCOUNTER — Ambulatory Visit (HOSPITAL_COMMUNITY)

## 2024-07-01 DIAGNOSIS — Z992 Dependence on renal dialysis: Secondary | ICD-10-CM | POA: Diagnosis not present

## 2024-07-01 DIAGNOSIS — N186 End stage renal disease: Secondary | ICD-10-CM | POA: Diagnosis not present

## 2024-07-01 DIAGNOSIS — E1122 Type 2 diabetes mellitus with diabetic chronic kidney disease: Secondary | ICD-10-CM | POA: Diagnosis not present

## 2024-07-02 DIAGNOSIS — N2581 Secondary hyperparathyroidism of renal origin: Secondary | ICD-10-CM | POA: Diagnosis not present

## 2024-07-02 DIAGNOSIS — Z992 Dependence on renal dialysis: Secondary | ICD-10-CM | POA: Diagnosis not present

## 2024-07-02 DIAGNOSIS — N186 End stage renal disease: Secondary | ICD-10-CM | POA: Diagnosis not present

## 2024-07-05 DIAGNOSIS — N186 End stage renal disease: Secondary | ICD-10-CM | POA: Diagnosis not present

## 2024-07-05 DIAGNOSIS — N2581 Secondary hyperparathyroidism of renal origin: Secondary | ICD-10-CM | POA: Diagnosis not present

## 2024-07-05 DIAGNOSIS — D631 Anemia in chronic kidney disease: Secondary | ICD-10-CM | POA: Diagnosis not present

## 2024-07-05 DIAGNOSIS — Z992 Dependence on renal dialysis: Secondary | ICD-10-CM | POA: Diagnosis not present

## 2024-07-06 ENCOUNTER — Encounter (HOSPITAL_BASED_OUTPATIENT_CLINIC_OR_DEPARTMENT_OTHER): Attending: Internal Medicine | Admitting: Internal Medicine

## 2024-07-06 DIAGNOSIS — I87311 Chronic venous hypertension (idiopathic) with ulcer of right lower extremity: Secondary | ICD-10-CM | POA: Diagnosis not present

## 2024-07-06 DIAGNOSIS — L97812 Non-pressure chronic ulcer of other part of right lower leg with fat layer exposed: Secondary | ICD-10-CM | POA: Insufficient documentation

## 2024-07-06 DIAGNOSIS — E11622 Type 2 diabetes mellitus with other skin ulcer: Secondary | ICD-10-CM | POA: Diagnosis not present

## 2024-07-06 DIAGNOSIS — N186 End stage renal disease: Secondary | ICD-10-CM | POA: Diagnosis not present

## 2024-07-06 DIAGNOSIS — E1122 Type 2 diabetes mellitus with diabetic chronic kidney disease: Secondary | ICD-10-CM | POA: Insufficient documentation

## 2024-07-08 DIAGNOSIS — H524 Presbyopia: Secondary | ICD-10-CM | POA: Diagnosis not present

## 2024-07-08 DIAGNOSIS — E119 Type 2 diabetes mellitus without complications: Secondary | ICD-10-CM | POA: Diagnosis not present

## 2024-07-09 DIAGNOSIS — M25561 Pain in right knee: Secondary | ICD-10-CM | POA: Diagnosis not present

## 2024-07-12 DIAGNOSIS — D631 Anemia in chronic kidney disease: Secondary | ICD-10-CM | POA: Diagnosis not present

## 2024-07-12 DIAGNOSIS — Z992 Dependence on renal dialysis: Secondary | ICD-10-CM | POA: Diagnosis not present

## 2024-07-12 DIAGNOSIS — N186 End stage renal disease: Secondary | ICD-10-CM | POA: Diagnosis not present

## 2024-07-12 DIAGNOSIS — N2581 Secondary hyperparathyroidism of renal origin: Secondary | ICD-10-CM | POA: Diagnosis not present

## 2024-07-19 DIAGNOSIS — D631 Anemia in chronic kidney disease: Secondary | ICD-10-CM | POA: Diagnosis not present

## 2024-07-19 DIAGNOSIS — N2581 Secondary hyperparathyroidism of renal origin: Secondary | ICD-10-CM | POA: Diagnosis not present

## 2024-07-19 DIAGNOSIS — Z992 Dependence on renal dialysis: Secondary | ICD-10-CM | POA: Diagnosis not present

## 2024-07-19 DIAGNOSIS — N186 End stage renal disease: Secondary | ICD-10-CM | POA: Diagnosis not present

## 2024-07-26 DIAGNOSIS — Z992 Dependence on renal dialysis: Secondary | ICD-10-CM | POA: Diagnosis not present

## 2024-07-26 DIAGNOSIS — N2581 Secondary hyperparathyroidism of renal origin: Secondary | ICD-10-CM | POA: Diagnosis not present

## 2024-07-26 DIAGNOSIS — N186 End stage renal disease: Secondary | ICD-10-CM | POA: Diagnosis not present

## 2024-07-26 DIAGNOSIS — D631 Anemia in chronic kidney disease: Secondary | ICD-10-CM | POA: Diagnosis not present

## 2024-07-29 ENCOUNTER — Ambulatory Visit (INDEPENDENT_AMBULATORY_CARE_PROVIDER_SITE_OTHER): Admitting: Orthopaedic Surgery

## 2024-07-29 DIAGNOSIS — M1711 Unilateral primary osteoarthritis, right knee: Secondary | ICD-10-CM | POA: Insufficient documentation

## 2024-07-29 MED ORDER — LIDOCAINE HCL 1 % IJ SOLN
2.0000 mL | INTRAMUSCULAR | Status: AC | PRN
  Administered 2024-07-29: 2 mL

## 2024-07-29 MED ORDER — METHYLPREDNISOLONE ACETATE 40 MG/ML IJ SUSP
40.0000 mg | INTRAMUSCULAR | Status: AC | PRN
  Administered 2024-07-29: 40 mg via INTRA_ARTICULAR

## 2024-07-29 MED ORDER — BUPIVACAINE HCL 0.5 % IJ SOLN
2.0000 mL | INTRAMUSCULAR | Status: AC | PRN
  Administered 2024-07-29: 2 mL via INTRA_ARTICULAR
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
## 2024-08-01 DIAGNOSIS — Z992 Dependence on renal dialysis: Secondary | ICD-10-CM | POA: Diagnosis not present

## 2024-08-01 DIAGNOSIS — N186 End stage renal disease: Secondary | ICD-10-CM | POA: Diagnosis not present

## 2024-08-01 DIAGNOSIS — E1122 Type 2 diabetes mellitus with diabetic chronic kidney disease: Secondary | ICD-10-CM | POA: Diagnosis not present

## 2024-08-02 DIAGNOSIS — D631 Anemia in chronic kidney disease: Secondary | ICD-10-CM | POA: Diagnosis not present

## 2024-08-02 DIAGNOSIS — N186 End stage renal disease: Secondary | ICD-10-CM | POA: Diagnosis not present

## 2024-08-02 DIAGNOSIS — Z992 Dependence on renal dialysis: Secondary | ICD-10-CM | POA: Diagnosis not present

## 2024-08-02 DIAGNOSIS — N2581 Secondary hyperparathyroidism of renal origin: Secondary | ICD-10-CM | POA: Diagnosis not present

## 2024-08-06 DIAGNOSIS — N186 End stage renal disease: Secondary | ICD-10-CM | POA: Diagnosis not present

## 2024-08-06 DIAGNOSIS — D631 Anemia in chronic kidney disease: Secondary | ICD-10-CM | POA: Diagnosis not present

## 2024-08-06 DIAGNOSIS — R634 Abnormal weight loss: Secondary | ICD-10-CM | POA: Diagnosis not present

## 2024-08-06 DIAGNOSIS — F5089 Other specified eating disorder: Secondary | ICD-10-CM | POA: Diagnosis not present

## 2024-08-09 DIAGNOSIS — D631 Anemia in chronic kidney disease: Secondary | ICD-10-CM | POA: Diagnosis not present

## 2024-08-09 DIAGNOSIS — N186 End stage renal disease: Secondary | ICD-10-CM | POA: Diagnosis not present

## 2024-08-09 DIAGNOSIS — Z992 Dependence on renal dialysis: Secondary | ICD-10-CM | POA: Diagnosis not present

## 2024-08-09 DIAGNOSIS — N2581 Secondary hyperparathyroidism of renal origin: Secondary | ICD-10-CM | POA: Diagnosis not present

## 2024-08-16 DIAGNOSIS — N2581 Secondary hyperparathyroidism of renal origin: Secondary | ICD-10-CM | POA: Diagnosis not present

## 2024-08-16 DIAGNOSIS — N186 End stage renal disease: Secondary | ICD-10-CM | POA: Diagnosis not present

## 2024-08-16 DIAGNOSIS — Z992 Dependence on renal dialysis: Secondary | ICD-10-CM | POA: Diagnosis not present

## 2024-08-16 DIAGNOSIS — D631 Anemia in chronic kidney disease: Secondary | ICD-10-CM | POA: Diagnosis not present

## 2024-08-23 DIAGNOSIS — D631 Anemia in chronic kidney disease: Secondary | ICD-10-CM | POA: Diagnosis not present

## 2024-08-23 DIAGNOSIS — N186 End stage renal disease: Secondary | ICD-10-CM | POA: Diagnosis not present

## 2024-08-23 DIAGNOSIS — Z992 Dependence on renal dialysis: Secondary | ICD-10-CM | POA: Diagnosis not present

## 2024-08-23 DIAGNOSIS — N2581 Secondary hyperparathyroidism of renal origin: Secondary | ICD-10-CM | POA: Diagnosis not present

## 2024-08-30 DIAGNOSIS — Z992 Dependence on renal dialysis: Secondary | ICD-10-CM | POA: Diagnosis not present

## 2024-08-30 DIAGNOSIS — N2581 Secondary hyperparathyroidism of renal origin: Secondary | ICD-10-CM | POA: Diagnosis not present

## 2024-08-30 DIAGNOSIS — N186 End stage renal disease: Secondary | ICD-10-CM | POA: Diagnosis not present

## 2024-08-30 DIAGNOSIS — D631 Anemia in chronic kidney disease: Secondary | ICD-10-CM | POA: Diagnosis not present

## 2024-08-31 DIAGNOSIS — E1122 Type 2 diabetes mellitus with diabetic chronic kidney disease: Secondary | ICD-10-CM | POA: Diagnosis not present

## 2024-08-31 DIAGNOSIS — N186 End stage renal disease: Secondary | ICD-10-CM | POA: Diagnosis not present

## 2024-08-31 DIAGNOSIS — Z992 Dependence on renal dialysis: Secondary | ICD-10-CM | POA: Diagnosis not present

## 2024-09-01 DIAGNOSIS — D631 Anemia in chronic kidney disease: Secondary | ICD-10-CM | POA: Diagnosis not present

## 2024-09-01 DIAGNOSIS — N186 End stage renal disease: Secondary | ICD-10-CM | POA: Diagnosis not present

## 2024-09-01 DIAGNOSIS — N2581 Secondary hyperparathyroidism of renal origin: Secondary | ICD-10-CM | POA: Diagnosis not present

## 2024-09-01 DIAGNOSIS — Z992 Dependence on renal dialysis: Secondary | ICD-10-CM | POA: Diagnosis not present

## 2024-09-06 DIAGNOSIS — Z992 Dependence on renal dialysis: Secondary | ICD-10-CM | POA: Diagnosis not present

## 2024-09-06 DIAGNOSIS — D631 Anemia in chronic kidney disease: Secondary | ICD-10-CM | POA: Diagnosis not present

## 2024-09-06 DIAGNOSIS — N2581 Secondary hyperparathyroidism of renal origin: Secondary | ICD-10-CM | POA: Diagnosis not present

## 2024-09-06 DIAGNOSIS — E1122 Type 2 diabetes mellitus with diabetic chronic kidney disease: Secondary | ICD-10-CM | POA: Diagnosis not present

## 2024-09-06 DIAGNOSIS — Z23 Encounter for immunization: Secondary | ICD-10-CM | POA: Diagnosis not present

## 2024-09-13 DIAGNOSIS — N186 End stage renal disease: Secondary | ICD-10-CM | POA: Diagnosis not present

## 2024-09-13 DIAGNOSIS — D631 Anemia in chronic kidney disease: Secondary | ICD-10-CM | POA: Diagnosis not present

## 2024-09-13 DIAGNOSIS — Z992 Dependence on renal dialysis: Secondary | ICD-10-CM | POA: Diagnosis not present

## 2024-09-13 DIAGNOSIS — N2581 Secondary hyperparathyroidism of renal origin: Secondary | ICD-10-CM | POA: Diagnosis not present

## 2024-09-13 DIAGNOSIS — R52 Pain, unspecified: Secondary | ICD-10-CM | POA: Diagnosis not present

## 2024-09-20 DIAGNOSIS — Z992 Dependence on renal dialysis: Secondary | ICD-10-CM | POA: Diagnosis not present

## 2024-09-20 DIAGNOSIS — N186 End stage renal disease: Secondary | ICD-10-CM | POA: Diagnosis not present

## 2024-09-20 DIAGNOSIS — D631 Anemia in chronic kidney disease: Secondary | ICD-10-CM | POA: Diagnosis not present

## 2024-09-20 DIAGNOSIS — N2581 Secondary hyperparathyroidism of renal origin: Secondary | ICD-10-CM | POA: Diagnosis not present

## 2024-09-20 DIAGNOSIS — R52 Pain, unspecified: Secondary | ICD-10-CM | POA: Diagnosis not present

## 2024-09-27 DIAGNOSIS — N2581 Secondary hyperparathyroidism of renal origin: Secondary | ICD-10-CM | POA: Diagnosis not present

## 2024-09-27 DIAGNOSIS — D631 Anemia in chronic kidney disease: Secondary | ICD-10-CM | POA: Diagnosis not present

## 2024-09-27 DIAGNOSIS — Z992 Dependence on renal dialysis: Secondary | ICD-10-CM | POA: Diagnosis not present

## 2024-09-29 ENCOUNTER — Encounter: Payer: Self-pay | Admitting: Podiatry

## 2024-09-29 ENCOUNTER — Ambulatory Visit: Admitting: Podiatry

## 2024-09-29 DIAGNOSIS — M79674 Pain in right toe(s): Secondary | ICD-10-CM

## 2024-09-29 DIAGNOSIS — M79675 Pain in left toe(s): Secondary | ICD-10-CM

## 2024-09-29 DIAGNOSIS — B351 Tinea unguium: Secondary | ICD-10-CM | POA: Diagnosis not present

## 2024-09-29 DIAGNOSIS — Z992 Dependence on renal dialysis: Secondary | ICD-10-CM

## 2024-09-29 DIAGNOSIS — L84 Corns and callosities: Secondary | ICD-10-CM | POA: Diagnosis not present

## 2024-09-29 DIAGNOSIS — E0822 Diabetes mellitus due to underlying condition with diabetic chronic kidney disease: Secondary | ICD-10-CM | POA: Diagnosis not present

## 2024-09-29 DIAGNOSIS — N186 End stage renal disease: Secondary | ICD-10-CM

## 2024-10-01 DIAGNOSIS — N186 End stage renal disease: Secondary | ICD-10-CM | POA: Diagnosis not present

## 2024-10-01 DIAGNOSIS — Z992 Dependence on renal dialysis: Secondary | ICD-10-CM | POA: Diagnosis not present

## 2024-10-01 DIAGNOSIS — E1122 Type 2 diabetes mellitus with diabetic chronic kidney disease: Secondary | ICD-10-CM | POA: Diagnosis not present

## 2024-10-03 NOTE — Progress Notes (Signed)
  Subjective:  Patient ID: Brenda Arnold, female    DOB: Jan 20, 1956,  MRN: [id number]  Brenda Arnold presents to clinic today for at risk foot care with h/o NIDDM with ESRD on hemodialysis and preulcerative lesion(s) of both feet and painful mycotic toenails that limit ambulation. Painful toenails interfere with ambulation. Aggravating factors include wearing enclosed shoe gear. Pain is relieved with periodic professional debridement. Painful preulcerative lesion(s) is/are aggravated when weightbearing with and without shoegear. Pain is relieved with periodic professional debridement.  Chief Complaint  Patient presents with   Diabetes    [facility] Diet control diabetes. A1C 5.2. LOV with PCP 04/01/24.   New problem(s): None.   PCP is [name], Velna JONELLE, MD.  Allergies  Allergen Reactions   Iodine  Shortness Of Breath   Shellfish Allergy Shortness Of Breath    Breathing Problems   Amlodipine Swelling   Felodipine Swelling    Headache   Lisinopril Cough   Chlorhexidine  Itching    CHG wipes    Review of Systems: Negative except as noted in the HPI.  Objective: No changes noted in today's physical examination. There were no vitals filed for this visit. Brenda Arnold is a pleasant 68 y.o. female in NAD. AAO x 3.  Vascular Examination: Capillary refill time immediate b/l. Palpable pedal pulses. No pain with calf compression b/l. Skin temperature gradient WNL b/l. No cyanosis or clubbing b/l. No ischemia or gangrene noted b/l. Pedal hair absent.  Neurological Examination: Sensation grossly intact b/l with 10 gram monofilament. Vibratory sensation intact b/l. Pt has subjective symptoms of neuropathy.  Dermatological Examination: Pedal skin with normal turgor, texture and tone b/l. Wound anterior lower leg right lower extremity with dressing which is clean, dry and intact. No interdigital macerations.   Toenails 1-5 b/l thick, discolored, elongated with subungual debris and pain on  dorsal palpation.   Preulcerative lesion noted submet head 5 b/l. There is visible subdermal hemorrhage. There is no surrounding erythema, no edema, no drainage, no odor, no fluctuance.  Musculoskeletal Examination: Muscle strength 5/5 to all lower extremity muscle groups bilaterally. Pes planus deformity noted bilateral LE.  Radiographs: None  Assessment/Plan: 1. Pain due to onychomycosis of toenails of both feet   2. Pre-ulcerative calluses   3. Diabetes mellitus due to underlying condition with chronic kidney disease on chronic dialysis, without long-term current use of insulin  [facility])   Patient was evaluated and treated. All patient's and/or POA's questions/concerns addressed on today's visit. Toenails 1-5 b/l debrided in length and girth without incident. Preulcerative lesion(s) submet head 5 b/l pared with sharp debridement without incident. Continue daily foot inspections and monitor blood glucose per PCP/Endocrinologist's recommendations. Continue soft, supportive shoe gear daily. Report any pedal injuries to medical professional. Call office if there are any questions/concerns.  Return in about 3 months (around 12/30/2024).  Delon LITTIE Merlin, DPM      [facility] LOCATION: [address], [place] [address]                   Office [phone]   [facility] LOCATION: [address] [place], [place] [address] Office [phone]

## 2024-10-04 DIAGNOSIS — D631 Anemia in chronic kidney disease: Secondary | ICD-10-CM | POA: Diagnosis not present

## 2024-10-04 DIAGNOSIS — Z992 Dependence on renal dialysis: Secondary | ICD-10-CM | POA: Diagnosis not present

## 2024-10-04 DIAGNOSIS — N2581 Secondary hyperparathyroidism of renal origin: Secondary | ICD-10-CM | POA: Diagnosis not present

## 2024-10-11 DIAGNOSIS — N186 End stage renal disease: Secondary | ICD-10-CM | POA: Diagnosis not present

## 2024-10-11 DIAGNOSIS — N2581 Secondary hyperparathyroidism of renal origin: Secondary | ICD-10-CM | POA: Diagnosis not present

## 2024-10-11 DIAGNOSIS — D631 Anemia in chronic kidney disease: Secondary | ICD-10-CM | POA: Diagnosis not present

## 2024-10-11 DIAGNOSIS — Z992 Dependence on renal dialysis: Secondary | ICD-10-CM | POA: Diagnosis not present

## 2024-10-18 DIAGNOSIS — D631 Anemia in chronic kidney disease: Secondary | ICD-10-CM | POA: Diagnosis not present

## 2024-10-18 DIAGNOSIS — N186 End stage renal disease: Secondary | ICD-10-CM | POA: Diagnosis not present

## 2024-10-18 DIAGNOSIS — Z992 Dependence on renal dialysis: Secondary | ICD-10-CM | POA: Diagnosis not present

## 2024-10-18 DIAGNOSIS — N2581 Secondary hyperparathyroidism of renal origin: Secondary | ICD-10-CM | POA: Diagnosis not present

## 2024-10-24 DIAGNOSIS — N2581 Secondary hyperparathyroidism of renal origin: Secondary | ICD-10-CM | POA: Diagnosis not present

## 2024-10-24 DIAGNOSIS — N186 End stage renal disease: Secondary | ICD-10-CM | POA: Diagnosis not present

## 2024-10-24 DIAGNOSIS — D631 Anemia in chronic kidney disease: Secondary | ICD-10-CM | POA: Diagnosis not present

## 2024-10-24 DIAGNOSIS — Z992 Dependence on renal dialysis: Secondary | ICD-10-CM | POA: Diagnosis not present

## 2024-10-31 DIAGNOSIS — N186 End stage renal disease: Secondary | ICD-10-CM | POA: Diagnosis not present

## 2024-10-31 DIAGNOSIS — E1122 Type 2 diabetes mellitus with diabetic chronic kidney disease: Secondary | ICD-10-CM | POA: Diagnosis not present

## 2024-10-31 DIAGNOSIS — Z992 Dependence on renal dialysis: Secondary | ICD-10-CM | POA: Diagnosis not present

## 2024-11-01 DIAGNOSIS — Z992 Dependence on renal dialysis: Secondary | ICD-10-CM | POA: Diagnosis not present

## 2024-11-01 DIAGNOSIS — N2581 Secondary hyperparathyroidism of renal origin: Secondary | ICD-10-CM | POA: Diagnosis not present

## 2024-11-01 DIAGNOSIS — D631 Anemia in chronic kidney disease: Secondary | ICD-10-CM | POA: Diagnosis not present

## 2024-11-02 ENCOUNTER — Telehealth: Payer: Self-pay

## 2024-11-02 DIAGNOSIS — N186 End stage renal disease: Secondary | ICD-10-CM

## 2024-11-02 NOTE — Progress Notes (Signed)
 Complex Care Management Note  Care Guide Note 11/02/2024 Name: SAMANTA GAL MRN: [id number] DOB: 1956-06-18  Rollene JONELLE Krage is a 68 y.o. year old female who sees [name], Velna JONELLE, MD for primary care. I reached out to Rollene JONELLE Flatness by phone today to offer complex care management services.  Ms. Rask was given information about Complex Care Management services today including:   The Complex Care Management services include support from the care team which includes your Nurse Care Manager, Clinical Social Worker, or Pharmacist.  The Complex Care Management team is here to help remove barriers to the health concerns and goals most important to you. Complex Care Management services are voluntary, and the patient may decline or stop services at any time by request to their care team member.   Complex Care Management Consent Status: Patient agreed to services and verbal consent obtained.   Follow up plan:  Telephone appointment with complex care management team member scheduled for:  11/04/24 @ 12:30 PM  Encounter Outcome:  Patient Scheduled  Leotis Rase [facility] Guide  Direct Dial : [phone]  Fax [phone]

## 2024-11-04 ENCOUNTER — Other Ambulatory Visit: Payer: Self-pay

## 2024-11-04 NOTE — Patient Outreach (Signed)
 Complex Care Management   Visit Note  11/04/2024  Name:  Brenda Arnold MRN: [id number] DOB: [birth year]  Situation: Referral received for Complex Care Management related to ESRD I obtained verbal consent from Patient.  Visit completed with [name]  on the phone. Patient goes to dialysis M/W/F, reports cravings for flour and cornmeal, following Nephrologist instructions to cook the flour and cornmeal before eating it.   Background:   Past Medical History:  Diagnosis Date   Agatston coronary artery calcium  score greater than 400    Ca score 3824 on CT 08/2021   Anemia    Arthritis    Asthma    Breast cancer (HCC) 04/04/2023   Complication of anesthesia    difficulty with getting oxygen saturation up- patient was not aware  Bottom of feet burning in PACU   Constipation    because of Iron    Diabetes mellitus    not on meds   DVT (deep venous thrombosis) (HCC) 2019   post hip replacement - right   Dyslipidemia    Epistaxis 11/05/2012   ESRD (end stage renal disease) on dialysis (HCC)    MWF; [address] (09/15/2018)- started 02/13/2017   History of blood transfusion    HTN (hypertension)    pt denies this. She states her BP runs low now   Hyperparathyroidism due to renal insufficiency    Hypotension    Neuromuscular disorder (HCC)    Neuropathy    Obesity    s/p panniculectomy   Paroxysmal A-fib (HCC)    a. chronic coumadin ;  b. 12/2009 Echo: EF 60-65%, Gr 1 DD.   PCOS (polycystic ovarian syndrome)    Pericarditis 08/2019   pericarditis with pericardial effusion, s/p right VATS/pericardial window 08/31/19   Pneumonia    Seasonal allergies    Sleep apnea    a. not using CPAP, last study  >8 yrs   Vitamin D  deficiency     Assessment: Patient Reported Symptoms:  Cognitive Cognitive Status: Alert and oriented to person, place, and time, Insightful and able to interpret abstract concepts, Normal speech and language skills      Neurological Neurological Review  of Symptoms: No symptoms reported    HEENT HEENT Symptoms Reported: No symptoms reported      Cardiovascular Cardiovascular Symptoms Reported: No symptoms reported Does patient have uncontrolled Hypertension?: No    Respiratory Respiratory Symptoms Reported: No symptoms reported    Endocrine Endocrine Symptoms Reported: No symptoms reported (A1C 5.2% on 05/02/2023) Is patient diabetic?: Yes Endocrine Self-Management Outcome: 5 (very good)  Gastrointestinal Gastrointestinal Symptoms Reported: No symptoms reported      Genitourinary Genitourinary Symptoms Reported: Other Other Genitourinary Symptoms: does not urinate, goes to hemodialysis M/W/F.    Integumentary Integumentary Symptoms Reported: Other Additional Integumentary Details: Fistula in left arm., site intact.    Musculoskeletal Musculoskelatal Symptoms Reviewed: Unsteady gait Additional Musculoskeletal Details: uses cane, walker as needed.   Falls in the past year?: No Fall risk Follow up: Falls prevention discussed  Psychosocial       Quality of Family Relationships: supportive (has a good friend that she speaks to at least twice a week.) Do you feel physically threatened by others?: No    11/04/2024    PHQ2-9 Depression Screening   Little interest or pleasure in doing things Not at all  Feeling down, depressed, or hopeless Not at all  PHQ-2 - Total Score 0  Trouble falling or staying asleep, or sleeping too much    Feeling tired  or having little energy    Poor appetite or overeating     Feeling bad about yourself - or that you are a failure or have let yourself or your family down    Trouble concentrating on things, such as reading the newspaper or watching television    Moving or speaking so slowly that other people could have noticed.  Or the opposite - being so fidgety or restless that you have been moving around a lot more than usual    Thoughts that you would be better off dead, or hurting yourself in some way     PHQ2-9 Total Score    If you checked off any problems, how difficult have these problems made it for you to do your work, take care of things at home, or get along with other people    Depression Interventions/Treatment      There were no vitals filed for this visit. Pain Scale: 0-10 Pain Score: 0-No pain  Medications Reviewed Today     Reviewed by Lucian Santana LABOR, RN (Registered Nurse) on 11/04/24 at 1636  Med List Status: <None>   Medication Order Taking? Sig Documenting Provider Last Dose Status Informant  acetaminophen  (TYLENOL ) 500 MG tablet [id number] Yes Take 2 tablets (1,000 mg total) by mouth every 8 (eight) hours as needed for mild pain. Vicci Burnard SAUNDERS, PA-C  Active Self           Med Note JERALYN, [place] A   Fri Sep 19, 2023  1:38 PM) Pt alternates taking extra and arthritis strength tylenol    acetaminophen  (TYLENOL ) 650 MG CR tablet [id number] Yes Take 650 mg by mouth every 8 (eight) hours as needed for pain. [provider]  Active Self           Med Note JERALYN, [place] A   Fri Sep 19, 2023  1:38 PM) Pt alternates taking extra and arthritis strength tylenol    amiodarone  (PACERONE ) 200 MG tablet [id number] Yes TAKE 1 TABLET BY MOUTH EVERY DAY Shlomo Wilbert SAUNDERS, MD  Active Self  anastrozole  (ARIMIDEX ) 1 MG tablet [id number] Yes Take 1 tablet (1 mg total) by mouth daily. [name], Praveena, MD  Active   atorvastatin  (LIPITOR) 40 MG tablet [id number] Yes TAKE 1 TABLET BY MOUTH EVERY DAY [name], NP  Active Self  B Complex-C-Folic Acid  (DIALYVITE  TABLET) TABS [id number] Yes Take 1 tablet by mouth at bedtime. [provider]  Active Self  bisacodyl  (DULCOLAX) 5 MG EC tablet [id number] Yes Take 1 tablet (5 mg total) by mouth daily as needed for moderate constipation. Vicci Burnard SAUNDERS, PA-C  Active Self  ciprofloxacin-dexamethasone  (CIPRODEX) OTIC suspension [id number]  Place 4 drops into both ears 2 (two) times daily as needed (ear wax build up).  Patient not  taking: Reported on 09/29/2024   [provider]  Active Self  cyclobenzaprine (FLEXERIL) 10 MG tablet [id number] Yes Take 10 mg by mouth at bedtime as needed for muscle spasms. [provider]  Active Self  docusate sodium  (COLACE) 100 MG capsule [id number] Yes Take 100 mg by mouth daily as needed for mild constipation or moderate constipation. [provider]  Active Self  ELIQUIS  5 MG TABS tablet [id number] Yes Take 1 tablet (5 mg total) by mouth 2 (two) times daily. Vicci Burnard SAUNDERS, PA-C  Active Self  ethyl chloride spray [id number] Yes Apply 1 application  topically every Monday, Wednesday, and Friday with hemodialysis. [provider]  Active Self  gabapentin  (NEURONTIN ) 100 MG  capsule [id number] Yes Take 100 mg by mouth at bedtime. [provider]  Active Self  iron  sucrose in sodium chloride  0.9 % 100 mL [id number]  Inject 50 mg into the vein. [provider]  Active Self  loratadine  (CLARITIN ) 10 MG tablet [id number] Yes Take 1 tablet (10 mg total) by mouth daily.  Patient taking differently: Take 10 mg by mouth daily as needed for allergies.   Milissa Tod PARAS, MD  Active Self  Methoxy PEG-Epoetin  Beta (MIRCERA IJ) [id number]  50 mcg. [provider]  Active Self  midodrine  (PROAMATINE ) 10 MG tablet [id number] Yes Take 1 tablet (10 mg total) by mouth 3 (three) times daily with meals. Rojelio Nest, DO  Active Self           Med Note JACKOLYN, [place] R   Wed Mar 28, 2021  2:43 PM)    sucroferric oxyhydroxide (VELPHORO ) 500 MG chewable tablet [id number] Yes Chew 2 tablets (1,000 mg total) by mouth 3 (three) times daily with meals.  Patient taking differently: Chew 500-1,000 mg by mouth See admin instructions. Take 1000 mg with meals and 500 mg with snacks   Milissa Tod PARAS, MD  Active Self            Recommendation:   Patient will speak to Nephologist at next dialysis appt for further interventions for flour/cornmeal cravings.   Continue Current Plan of Care  Follow Up Plan:   Telephone follow-up two weeks  Santana Stamp BSN, CCM [place]  [facility] Population Health RN Care Manager Direct Dial : [phone]  Fax: [phone]

## 2024-11-04 NOTE — Patient Instructions (Signed)
 Visit Information  Thank you for taking time to visit with me today. Please don't hesitate to contact me if I can be of assistance to you before our next scheduled appointment.  Our next appointment is by telephone on Thursday, December 18th at 12:30pm. Please call the care guide team at [phone] if you need to cancel or reschedule your appointment.   Following is a copy of your care plan:   Goals Addressed             This Visit's Progress    VBCI RN Care Plan       Problems:  Chronic Disease Management support and education needs related to ESRD  Goal: Over the next 3 months the Patient will demonstrate Ongoing health management independence as evidenced by maintaining fluid balance with weight gains less than 2-3 pounds between visits, BP within normal limits, and eupnea.          Over the next 30 days, patient will engage in Advanced Care Plan discussions with next of kin Over the next 15 days, patient will verbalize red flag symptoms such as infections, dialysis-related issues,   Interventions:    Chronic Kidney Disease Interventions: Reviewed medications with patient and discussed importance of compliance    Reviewed scheduled/upcoming provider appointments including    Screening for signs and symptoms of depression related to chronic disease state      Assessed social determinant of health barriers    Assessed recent BP's, patient takes midodrine  due to low BP's at home, BP increases when she is at dialysis.  Last practice recorded BP readings:  BP Readings from Last 3 Encounters:  05/18/24 (!) 143/65  05/06/24 (!) 150/57  05/05/24 117/76   Most recent eGFR/CrCl: No results found for: EGFR  No components found for: CRCL  Patient Self-Care Activities:  Attend all scheduled provider appointments Call provider office for new concerns or questions  Take medications as prescribed    Plan:  Telephone follow up appointment with care management team member  scheduled for:  two weeks.             Please call the [place]  National Suicide Prevention Lifeline: 314-184-2980 or TTY: 330-037-7940 TTY (973)311-2905) to talk to a trained counselor if you are experiencing a Mental Health or Behavioral Health Crisis or need someone to talk to.  Patient verbalized understanding of Care plan and visit instructions communicated this visit  Santana Stamp BSN, CCM [place]  [facility] Health RN Care Manager Direct Dial : [phone]  Fax: [phone]

## 2024-11-18 ENCOUNTER — Telehealth

## 2024-11-18 NOTE — Patient Instructions (Signed)
 Brenda Arnold - I am sorry I was unable to reach you today for our scheduled appointment. I work with Vernon Velna SAUNDERS, MD and am calling to support your healthcare needs. Please contact me at [phone] at your earliest convenience. I look forward to speaking with you soon.   Thank you,  Santana Stamp BSN, CCM [place]  [facility] Population Health RN Care Manager Direct Dial : [phone]  Fax: [phone]

## 2024-11-30 ENCOUNTER — Telehealth: Payer: Self-pay

## 2024-11-30 NOTE — Patient Instructions (Signed)
 Brenda Arnold - I am sorry I was unable to reach you today for our scheduled appointment. I work with Vernon Velna SAUNDERS, MD and am calling to support your healthcare needs. Please contact me at [phone] at your earliest convenience. I look forward to speaking with you soon.   Thank you,  Santana Stamp BSN, CCM [place]  [facility] Population Health RN Care Manager Direct Dial : [phone]  Fax: [phone]

## 2024-12-09 ENCOUNTER — Telehealth: Payer: Self-pay

## 2024-12-09 ENCOUNTER — Telehealth: Payer: Self-pay | Admitting: Cardiology

## 2024-12-09 NOTE — Telephone Encounter (Signed)
"  ° °  [facility] Pre-operative Risk Assessment    Request for surgical clearance:  What type of surgery is being performed?  Left Carpal Tunnel Release   When is this surgery scheduled?  01/20/25   What type of clearance is required (medical clearance vs. Pharmacy clearance to hold med vs. Both)?  Both   Are there any medications that need to be held prior to surgery and how long? Please advise on Eliquis .   Practice name and name of physician performing surgery?  [facility]  Dr. Franky Curia  What is your office phone number? [phone]   7.   What is your office fax number [phone]  8.   Anesthesia type (None, local, MAC, general)?  General   "

## 2024-12-09 NOTE — Telephone Encounter (Signed)
" ° °  Name: Brenda Arnold  DOB: [birth year]  MRN: [id number]  Primary Cardiologist: Wilbert Bihari, MD  Chart reviewed as part of pre-operative protocol coverage. Because of Ziyan Hillmer Pew's past medical history and time since last visit, she will require a follow-up in-office visit in order to better assess preoperative cardiovascular risk.  Patient has not been seen since 2024.  Patient will need updated labs (CBC, BMET) for Eliquis  hold recommendations.  Pre-op covering staff: - Please schedule appointment and call patient to inform them. If patient already had an upcoming appointment within acceptable timeframe, please add pre-op clearance to the appointment notes so provider is aware. - Please contact requesting surgeon's office via preferred method (i.e, phone, fax) to inform them of need for appointment prior to surgery.   Damien JAYSON Braver, NP  12/09/2024, 4:22 PM   "

## 2024-12-10 ENCOUNTER — Other Ambulatory Visit: Payer: Self-pay | Admitting: Orthopedic Surgery

## 2024-12-10 NOTE — Telephone Encounter (Signed)
 Left message for the pt to call back and schedule in office appt for preop clearance. Pt will also need labs to be done as well CBC and BMET.

## 2024-12-13 NOTE — Telephone Encounter (Signed)
 Called and left the patient a detailed message to call back to get scheduled for pre-op clearance. 2nd attempt.

## 2024-12-14 ENCOUNTER — Encounter (HOSPITAL_BASED_OUTPATIENT_CLINIC_OR_DEPARTMENT_OTHER): Payer: Self-pay

## 2024-12-14 ENCOUNTER — Telehealth: Payer: Self-pay

## 2024-12-14 NOTE — Patient Instructions (Signed)
 Olamide R Liles - I am sorry I was unable to reach you today for our scheduled appointment. I work with Vernon Velna SAUNDERS, MD and am calling to support your healthcare needs. Please contact me at [phone] at your earliest convenience. I look forward to speaking with you soon.   Thank you,  Santana Stamp BSN, CCM [place]  [facility] Population Health RN Care Manager Direct Dial : [phone]  Fax: [phone]

## 2024-12-15 NOTE — Telephone Encounter (Signed)
 Pt has been scheduled in office appt 12/16/24 Damien Braver, NP for preop clearance.

## 2024-12-15 NOTE — Telephone Encounter (Signed)
 I left message for pt on both# for he t call back as she is needing an appt in office. She was scheduled a tele appt in error. I have cancelled the tele appt and have left message on both # for the pt to call back schedule in office appt.

## 2024-12-16 ENCOUNTER — Ambulatory Visit: Attending: Nurse Practitioner | Admitting: Nurse Practitioner

## 2024-12-16 ENCOUNTER — Encounter: Payer: Self-pay | Admitting: Nurse Practitioner

## 2024-12-16 VITALS — BP 118/66 | HR 86 | Ht 67.0 in | Wt 230.0 lb

## 2024-12-16 DIAGNOSIS — I10 Essential (primary) hypertension: Secondary | ICD-10-CM

## 2024-12-16 DIAGNOSIS — I2510 Atherosclerotic heart disease of native coronary artery without angina pectoris: Secondary | ICD-10-CM | POA: Diagnosis not present

## 2024-12-16 DIAGNOSIS — Z8679 Personal history of other diseases of the circulatory system: Secondary | ICD-10-CM | POA: Diagnosis not present

## 2024-12-16 DIAGNOSIS — Z86718 Personal history of other venous thrombosis and embolism: Secondary | ICD-10-CM

## 2024-12-16 DIAGNOSIS — I480 Paroxysmal atrial fibrillation: Secondary | ICD-10-CM | POA: Diagnosis not present

## 2024-12-16 DIAGNOSIS — E785 Hyperlipidemia, unspecified: Secondary | ICD-10-CM

## 2024-12-16 DIAGNOSIS — Z992 Dependence on renal dialysis: Secondary | ICD-10-CM

## 2024-12-16 DIAGNOSIS — N186 End stage renal disease: Secondary | ICD-10-CM

## 2024-12-16 DIAGNOSIS — Z79899 Other long term (current) drug therapy: Secondary | ICD-10-CM | POA: Diagnosis not present

## 2024-12-16 DIAGNOSIS — Z01810 Encounter for preprocedural cardiovascular examination: Secondary | ICD-10-CM

## 2024-12-16 LAB — LIPID PANEL

## 2024-12-16 NOTE — Patient Instructions (Signed)
 Medication Instructions:  Your physician recommends that you continue on your current medications as directed. Please refer to the Current Medication list given to you today.  *If you need a refill on your cardiac medications before your next appointment, please call your pharmacy*  Lab Work: LiPID PANEL, CBC, CMET, TSH today  Testing/Procedures: Chest Xray   Follow-Up: At [facility], you and your health needs are our priority.  As part of our continuing mission to provide you with exceptional heart care, our providers are all part of one team.  This team includes your primary Cardiologist (physician) and Advanced Practice Providers or APPs (Physician Assistants and Nurse Practitioners) who all work together to provide you with the care you need, when you need it.  Your next appointment:   6 month(s)  Provider:   Wilbert Bihari, MD    We recommend signing up for the patient portal called MyChart.  Sign up information is provided on this After Visit Summary.  MyChart is used to connect with patients for Virtual Visits (Telemedicine).  Patients are able to view lab/test results, encounter notes, upcoming appointments, etc.  Non-urgent messages can be sent to your provider as well.   To learn more about what you can do with MyChart, go to [email or web address].   Other Instructions

## 2024-12-16 NOTE — Progress Notes (Addendum)
 "  Office Visit    Patient Name: Brenda Arnold Date of Encounter: 12/16/2024  Primary Care Provider:  Vernon Velna JONELLE, MD Primary Cardiologist:  Wilbert Bihari, MD  Chief Complaint    69 year old female with a history of nonobstructive CAD, paroxysmal atrial fibrillation on Eliquis , pericarditis with pericardial effusion s/p right VATS/pericardial window in 2020, DVT, hypertension, hyperlipidemia, type 2 diabetes, ESRD on HD, anemia, breast cancer s/p right lumpectomy, and OSA not on CPAP who presents for follow-up related to atrial fibrillation and for preoperative cardiac evaluation.  Past Medical History    Past Medical History:  Diagnosis Date   Agatston coronary artery calcium  score greater than 400    Ca score 3824 on CT 08/2021   Anemia    Arthritis    Asthma    Breast cancer (HCC) 04/04/2023   Complication of anesthesia    difficulty with getting oxygen saturation up- patient was not aware  Bottom of feet burning in PACU   Constipation    because of Iron    Diabetes mellitus    not on meds   DVT (deep venous thrombosis) (HCC) 2019   post hip replacement - right   Dyslipidemia    Epistaxis 11/05/2012   ESRD (end stage renal disease) on dialysis (HCC)    MWF; [address] (09/15/2018)- started 02/13/2017   History of blood transfusion    HTN (hypertension)    pt denies this. She states her BP runs low now   Hyperparathyroidism due to renal insufficiency    Hypotension    Neuromuscular disorder (HCC)    Neuropathy    Obesity    s/p panniculectomy   Paroxysmal A-fib (HCC)    a. chronic coumadin ;  b. 12/2009 Echo: EF 60-65%, Gr 1 DD.   PCOS (polycystic ovarian syndrome)    Pericarditis 08/2019   pericarditis with pericardial effusion, s/p right VATS/pericardial window 08/31/19   Pneumonia    Seasonal allergies    Sleep apnea    a. not using CPAP, last study  >8 yrs   Vitamin D  deficiency    Past Surgical History:  Procedure Laterality Date   A/V  FISTULAGRAM N/A 01/01/2024   Procedure: A/V Fistulagram;  Surgeon: Melia Lynwood ORN, MD;  Location: [facility] INVASIVE CV LAB;  Service: Cardiovascular;  Laterality: N/A;   A/V SHUNT INTERVENTION N/A 04/13/2024   Procedure: A/V SHUNT INTERVENTION;  Surgeon: Tobie Gordy POUR, MD;  Location: [facility] INVASIVE CV LAB;  Service: Cardiovascular;  Laterality: N/A;   ABDOMINAL HYSTERECTOMY     with panniculectomy   AV FISTULA PLACEMENT  09/02/2012   Procedure: ARTERIOVENOUS (AV) FISTULA CREATION;  Surgeon: Carlin FORBES Haddock, MD;  Location: [facility] OR;  Service: Vascular;  Laterality: Left;  Creation of Left Radial-Cephalic Fistula    BREAST BIOPSY Right 04/04/2023   US  RT BREAST BX W LOC DEV 1ST LESION IMG BX SPEC US  GUIDE 04/04/2023 [facility]   BREAST BIOPSY  05/06/2023   US  RT RADIOACTIVE SEED LOC 05/06/2023 [facility]   BREAST LUMPECTOMY WITH RADIOACTIVE SEED LOCALIZATION Right 05/08/2023   Procedure: RIGHT BREAST LUMPECTOMY WITH RADIOACTIVE SEED LOCALIZATION;  Surgeon: Curvin Deward MOULD, MD;  Location: [facility] OR;  Service: General;  Laterality: Right;   COLONOSCOPY     COLONOSCOPY W/ BIOPSIES AND POLYPECTOMY     DILATION AND CURETTAGE OF UTERUS     FISTULA SUPERFICIALIZATION Left 09/23/2023   Procedure: PLICATION OF LEFT ARM FISTULA;  Surgeon: Lanis Fonda FORBES, MD;  Location: [facility] OR;  Service: Vascular;  Laterality: Left;   KNEE ARTHROSCOPY Right    PANNICULECTOMY     REVISON OF ARTERIOVENOUS FISTULA Left 04/27/2014   Procedure: REVISON OF LEFT RADIAL-CEPHALIC ARTERIOVENOUS FISTULA;  Surgeon: Lynwood JONETTA Collum, MD;  Location: [facility] OR;  Service: Vascular;  Laterality: Left;   REVISON OF ARTERIOVENOUS FISTULA Left 01/13/2020   Procedure: REVISON OF ARTERIOVENOUS FISTULA;  Surgeon: Gretta Lonni PARAS, MD;  Location: [facility] OR;  Service: Vascular;  Laterality: Left;   REVISON OF ARTERIOVENOUS FISTULA Left 08/11/2023   Procedure: PLICATION OF LEFT ARM ARTERIOVENOUS FISTULA;  Surgeon: Pearline Norman RAMAN, MD;  Location: [facility] OR;  Service:  Vascular;  Laterality: Left;   TOTAL HIP ARTHROPLASTY Right 09/15/2018   TOTAL HIP ARTHROPLASTY Right 09/15/2018   Procedure: RIGHT TOTAL HIP ARTHROPLASTY ANTERIOR APPROACH;  Surgeon: Jerri Kay HERO, MD;  Location: [facility] OR;  Service: Orthopedics;  Laterality: Right;   TOTAL HIP ARTHROPLASTY Left 11/23/2020   Procedure: LEFT TOTAL HIP ARTHROPLASTY ANTERIOR APPROACH;  Surgeon: Jerri Kay HERO, MD;  Location: [facility] OR;  Service: Orthopedics;  Laterality: Left;  NEEDS RNFA PLEASE   UVULOPLASTY     VIDEO ASSISTED THORACOSCOPY (VATS)/WEDGE RESECTION Right 08/31/2019   Procedure: VIDEO ASSISTED THORACOSCOPY/ DRAINAGE OF PERICARDIAL EFFUSION/ PERICARDIAL WINDOW/ ABORTED PERICARDIOCENTESIS ;  Surgeon: Shyrl Linnie KIDD, MD;  Location: [facility] OR;  Service: Thoracic;  Laterality: Right;    Allergies  Allergies[1]   Labs/Other Studies Reviewed    The following studies were reviewed today:  Cardiac Studies & Procedures   ______________________________________________________________________________________________   STRESS TESTS  MYOCARDIAL PERFUSION IMAGING 09/20/2021  Interpretation Summary   Findings are consistent with no prior ischemia and no prior myocardial infarction. The study is low risk.   No ST deviation was noted.   LV perfusion is normal.   Left ventricular function is normal. Nuclear stress EF: 83 %. The left ventricular ejection fraction is hyperdynamic (>65%). End diastolic cavity size is normal. End systolic cavity size is normal.   Prior study done without evidence of ischemia or infarction.  Hyperkinetic LV. Diaphragmatic attenuation noted. Study quality is fair.  Negative for ischemia or infarction, decreased sensitivity in the setting of artifact.   ECHOCARDIOGRAM  ECHOCARDIOGRAM COMPLETE 03/16/2021  Narrative ECHOCARDIOGRAM REPORT    Patient Name:   SHAKOYA GILMORE [place] Date of Exam: 03/16/2021 Medical Rec #:  [id number]          Height:       66.0 in Accession #:     [id number]         Weight:       234.0 lb Date of Birth:  [birth year]          BSA:          2.138 m Patient Age:    64 years           BP:           84/54 mmHg Patient Gender: F                  HR:           93 bpm. Exam Location:  Inpatient  Procedure: 2D Echo, Cardiac Doppler and Color Doppler  STAT ECHO Reported to: Dr Shelda Lonni on 03/16/2021 1:55:00 PM Dr. Lonni Maine bedside to review images.  Indications:    Ventricular tachycardia  History:        Patient has prior history of Echocardiogram examinations, most recent 02/04/2020. Arrythmias:Atrial Fibrillation; Risk Factors:Hypertension, Dyslipidemia and Diabetes. ESRD.  Sonographer:  Rome Eans RDCS (AE) Referring Phys: [id number] LONNI LITTIE NANAS   Sonographer Comments: Patient is morbidly obese. IMPRESSIONS   1. Left ventricular ejection fraction, by estimation, is 70 to 75%. The left ventricle has hyperdynamic function. The left ventricle has no regional wall motion abnormalities. There is moderate concentric left ventricular hypertrophy. Left ventricular diastolic parameters were normal. 2. Right ventricular systolic function is normal. The right ventricular size is normal. There is normal pulmonary artery systolic pressure. 3. The mitral valve is grossly normal. Trivial mitral valve regurgitation. No evidence of mitral stenosis. 4. The aortic valve is tricuspid. There is moderate calcification of the aortic valve. There is mild thickening of the aortic valve. Aortic valve regurgitation is not visualized. Mild to moderate aortic valve sclerosis/calcification is present, without any evidence of aortic stenosis. 5. The inferior vena cava is normal in size with <50% respiratory variability, suggesting right atrial pressure of 8 mmHg.  Comparison(s): No significant change from prior study.  Conclusion(s)/Recommendation(s): Otherwise normal echocardiogram, with minor abnormalities described in the  report.  FINDINGS Left Ventricle: Left ventricular ejection fraction, by estimation, is 70 to 75%. The left ventricle has hyperdynamic function. The left ventricle has no regional wall motion abnormalities. The left ventricular internal cavity size was normal in size. There is moderate concentric left ventricular hypertrophy. Left ventricular diastolic parameters were normal.  Right Ventricle: The right ventricular size is normal. Right vetricular wall thickness was not well visualized. Right ventricular systolic function is normal. There is normal pulmonary artery systolic pressure. The tricuspid regurgitant velocity is 2.16 m/s, and with an assumed right atrial pressure of 8 mmHg, the estimated right ventricular systolic pressure is 26.7 mmHg.  Left Atrium: Left atrial size was normal in size.  Right Atrium: Right atrial size was normal in size.  Pericardium: There is no evidence of pericardial effusion.  Mitral Valve: The mitral valve is grossly normal. Trivial mitral valve regurgitation. No evidence of mitral valve stenosis.  Tricuspid Valve: The tricuspid valve is normal in structure. Tricuspid valve regurgitation is trivial.  Aortic Valve: The aortic valve is tricuspid. There is moderate calcification of the aortic valve. There is mild thickening of the aortic valve. Aortic valve regurgitation is not visualized. Mild to moderate aortic valve sclerosis/calcification is present, without any evidence of aortic stenosis. Aortic valve mean gradient measures 4.0 mmHg. Aortic valve peak gradient measures 6.8 mmHg. Aortic valve area, by VTI measures 2.12 cm.  Pulmonic Valve: The pulmonic valve was not well visualized. Pulmonic valve regurgitation is not visualized. No evidence of pulmonic stenosis.  Aorta: The aortic root and ascending aorta are structurally normal, with no evidence of dilitation.  Venous: The inferior vena cava is normal in size with less than 50% respiratory variability,  suggesting right atrial pressure of 8 mmHg.  IAS/Shunts: The atrial septum is grossly normal.   LEFT VENTRICLE PLAX 2D LVIDd:         3.20 cm LVIDs:         1.90 cm LV PW:         1.60 cm LV IVS:        1.50 cm LVOT diam:     1.80 cm LV SV:         51 LV SV Index:   24 LVOT Area:     2.54 cm   RIGHT VENTRICLE             IVC RV Basal diam:  2.90 cm     IVC diam: 1.95 cm  RV S prime:     11.50 cm/s TAPSE (M-mode): 2.0 cm  LEFT ATRIUM             Index       RIGHT ATRIUM           Index LA diam:        2.70 cm 1.26 cm/m  RA Area:     15.60 cm LA Vol (A2C):   30.6 ml 14.31 ml/m RA Volume:   38.60 ml  18.06 ml/m LA Vol (A4C):   23.5 ml 10.99 ml/m LA Biplane Vol: 28.4 ml 13.28 ml/m AORTIC VALVE AV Area (Vmax):    1.92 cm AV Area (Vmean):   2.06 cm AV Area (VTI):     2.12 cm AV Vmax:           130.00 cm/s AV Vmean:          96.200 cm/s AV VTI:            0.242 m AV Peak Grad:      6.8 mmHg AV Mean Grad:      4.0 mmHg LVOT Vmax:         97.90 cm/s LVOT Vmean:        77.900 cm/s LVOT VTI:          0.202 m LVOT/AV VTI ratio: 0.83  AORTA Ao Root diam: 2.90 cm Ao Asc diam:  3.10 cm  TRICUSPID VALVE TR Peak grad:   18.7 mmHg TR Vmax:        216.00 cm/s  SHUNTS Systemic VTI:  0.20 m Systemic Diam: 1.80 cm  Shelda Bruckner MD Electronically signed by Shelda Bruckner MD Signature Date/Time: 03/16/2021/2:47:08 PM    Final      CT SCANS  CT CARDIAC SCORING (SELF PAY ONLY) 08/23/2021  Addendum 08/23/2021  4:02 PM ADDENDUM REPORT: 08/23/2021 15:59  CLINICAL DATA:  Cardiovascular Disease Risk stratification  EXAM: Coronary Calcium  Score  TECHNIQUE: A gated, non-contrast computed tomography scan of the heart was performed using 3mm slice thickness. Axial images were analyzed on a dedicated workstation. Calcium  scoring of the coronary arteries was performed using the Agatston method.  FINDINGS: Coronary arteries: Normal  origins.  Coronary Calcium  Score:  Left main: 0  Left anterior descending artery: 2027  Left circumflex artery: 1031  Right coronary artery: 736  Total: 3824  Percentile: 99  Pericardium: Mild increased apical thickness with calcification.  Ascending Aorta: Normal caliber.  Aortic atherosclerosis.  Non-cardiac: See separate report from [facility].  IMPRESSION: Coronary calcium  score of 3824. This was 40 percentile for age-, race-, and sex-matched controls.  RECOMMENDATIONS: Coronary artery calcium  (CAC) score is a strong predictor of incident coronary heart disease (CHD) and provides predictive information beyond traditional risk factors. CAC scoring is reasonable to use in the decision to withhold, postpone, or initiate statin therapy in intermediate-risk or selected borderline-risk asymptomatic adults (age 79-75 years and LDL-C >=70 to <190 mg/dL) who do not have diabetes or established atherosclerotic cardiovascular disease (ASCVD).* In intermediate-risk (10-year ASCVD risk >=7.5% to <20%) adults or selected borderline-risk (10-year ASCVD risk >=5% to <7.5%) adults in whom a CAC score is measured for the purpose of making a treatment decision the following recommendations have been made:  If CAC=0, it is reasonable to withhold statin therapy and reassess in 5 to 10 years, as long as higher risk conditions are absent (diabetes mellitus, family history of premature CHD in first degree relatives (males <55 years; females <65 years), cigarette smoking,  or LDL >=190 mg/dL).  If CAC is 1 to 99, it is reasonable to initiate statin therapy for patients >=57 years of age.  If CAC is >=100 or >=75th percentile, it is reasonable to initiate statin therapy at any age.  Cardiology referral should be considered for patients with CAC scores >=400 or >=75th percentile.  *2018 AHA/ACC/AACVPR/AAPA/ABC/ACPM/ADA/AGS/APhA/ASPC/NLA/PCNA Guideline on the Management of  Blood Cholesterol: A Report of the American College of Cardiology/American Heart Association Task Force on Clinical Practice Guidelines. J Am Coll Cardiol. 2019;73(24):3168-3209.   Electronically Signed By: Oneil Parchment M.D. On: 08/23/2021 15:59  Narrative EXAM: OVER-READ INTERPRETATION  CT CHEST  The following report is an over-read performed by radiologist Dr. Rockey Kilts of [facility] on 08/23/2021. This over-read does not include interpretation of cardiac or coronary anatomy or pathology. The calcium  score interpretation by the cardiologist is attached.  COMPARISON:  Chest radiograph 03/16/2021.  CTA chest 08/24/2019  FINDINGS: Vascular: Aortic atherosclerosis.  Tortuous thoracic aorta.  Mediastinum/Nodes: No imaged thoracic adenopathy.  Lungs/Pleura: No pleural fluid. Bibasilar subsegmental atelectasis or scar. Apparent right middle lobe nodularity is likely due to mucoid impaction on 47/4 and is similar to 08/24/2019 at 4 mm. Considered benign.  Upper Abdomen: Normal imaged portions of the liver, spleen, stomach, right adrenal gland. Left adrenal thickening. Right renal cortical thinning with an upper pole exophytic 5.5 cm low-density lesion which is likely a cyst or minimally complex cyst.  Musculoskeletal: No acute osseous abnormality.  IMPRESSION: 1.  No acute findings in the imaged extracardiac chest. 2.  Aortic Atherosclerosis (ICD10-I70.0).  Electronically Signed: By: Rockey Kilts M.D. On: 08/23/2021 15:48     ______________________________________________________________________________________________     Recent Labs: No results found for requested labs within last 365 days.  Recent Lipid Panel    Component Value Date/Time   CHOL 164 03/18/2021 0112   TRIG 75 03/18/2021 0112   HDL 46 03/18/2021 0112   CHOLHDL 3.6 03/18/2021 0112   VLDL 15 03/18/2021 0112   LDLCALC 103 (H) 03/18/2021 0112    History of Present Illness    69 year old female with the above past medical history including nonobstructive CAD, paroxysmal atrial fibrillation on Eliquis , pericarditis with pericardial effusion s/p right VATS/pericardial window in 2020, DVT, hypertension, hyperlipidemia, type 2 diabetes, ESRD on HD, anemia, breast cancer s/p right lumpectomy, and OSA not on CPAP.  She was hospitalized in October 2020 in the setting of pericarditis.  She developed cardiac tamponade and underwent pericardial window.  She subsequently developed atrial fibrillation.  She was hospitalized in April 2022 in the setting of atrial fibrillation with RVR s/p DCCV.  Most recent echocardiogram in 03/2024 showed EF 70 to 75%, hyperdynamic LV function, no RWMA, moderate concentric LVH, normal RV systolic function, mild to moderate aortic valve sclerosis without evidence of aortic stenosis.  Coronary calcium  score in 08/2021 was 3824 (90th percentile).  Myoview  in 09/2021 was negative for ischemia.  She was last seen in the office on 10/15/2023 and was stable from a cardiac standpoint.  She reported stable intermittent palpitations, denied symptoms concerning for angina.  She presents today for follow-up and for preoperative cardiac evaluation for left carpal tunnel release on 01/20/2025 with [facility] with request to hold Eliquis  prior to procedure.  Since her last visit she has been stable from a cardiac standpoint.  She reports mild dyspnea on exertion with more strenuous activities, this has been ongoing for years, stable.  She denies any chest pain,  palpitations, dizziness, edema, PND, orthopnea, weight gain.  Overall, she reports feeling well.  Home Medications    Current Outpatient Medications  Medication Sig Dispense Refill   acetaminophen  (TYLENOL ) 500 MG tablet Take 2 tablets (1,000 mg total) by mouth every 8 (eight) hours as needed for mild pain.     acetaminophen  (TYLENOL ) 650 MG CR tablet Take 650 mg by mouth every 8 (eight)  hours as needed for pain.     amiodarone  (PACERONE ) 200 MG tablet TAKE 1 TABLET BY MOUTH EVERY DAY 90 tablet 2   anastrozole  (ARIMIDEX ) 1 MG tablet Take 1 tablet (1 mg total) by mouth daily. 90 tablet 3   atorvastatin  (LIPITOR) 40 MG tablet TAKE 1 TABLET BY MOUTH EVERY DAY 90 tablet 3   B Complex-C-Folic Acid  (DIALYVITE  TABLET) TABS Take 1 tablet by mouth at bedtime.     bisacodyl  (DULCOLAX) 5 MG EC tablet Take 1 tablet (5 mg total) by mouth daily as needed for moderate constipation.     ciprofloxacin-dexamethasone  (CIPRODEX) OTIC suspension Place 4 drops into both ears 2 (two) times daily as needed (ear wax build up).     cyclobenzaprine (FLEXERIL) 10 MG tablet Take 10 mg by mouth at bedtime as needed for muscle spasms.     docusate sodium  (COLACE) 100 MG capsule Take 100 mg by mouth daily as needed for mild constipation or moderate constipation.     ELIQUIS  5 MG TABS tablet Take 1 tablet (5 mg total) by mouth 2 (two) times daily. 180 tablet 3   ethyl chloride spray Apply 1 application  topically every Monday, Wednesday, and Friday with hemodialysis.     gabapentin  (NEURONTIN ) 100 MG capsule Take 100 mg by mouth at bedtime.  3   iron  sucrose in sodium chloride  0.9 % 100 mL Inject 50 mg into the vein.     loratadine  (CLARITIN ) 10 MG tablet Take 1 tablet (10 mg total) by mouth daily. (Patient taking differently: Take 10 mg by mouth daily as needed for allergies.) 30 tablet 0   Methoxy PEG-Epoetin  Beta (MIRCERA IJ) 50 mcg.     midodrine  (PROAMATINE ) 10 MG tablet Take 1 tablet (10 mg total) by mouth 3 (three) times daily with meals.     sucroferric oxyhydroxide (VELPHORO ) 500 MG chewable tablet Chew 2 tablets (1,000 mg total) by mouth 3 (three) times daily with meals. (Patient taking differently: Chew 500-1,000 mg by mouth See admin instructions. Take 1000 mg with meals and 500 mg with snacks) 180 tablet 0   No current facility-administered medications for this visit.     Review of Systems    She  denies chest pain, palpitations, pnd, orthopnea, n, v, dizziness, syncope, edema, weight gain, or early satiety. All other systems reviewed and are otherwise negative except as noted above.   Physical Exam    VS:  BP 118/66 (BP Location: Right Arm, Patient Position: Sitting, Cuff Size: Normal)   Pulse 86   Ht 5' 7 (1.702 m)   Wt 230 lb (104.3 kg)   SpO2 97%   BMI 36.02 kg/m   GEN: Well nourished, well developed, in no acute distress. HEENT: normal. Neck: Supple, no JVD, carotid bruits, or masses. Cardiac: RRR, no murmurs, rubs, or gallops. No clubbing, cyanosis, edema.  Radials/DP/PT 2+ and equal bilaterally.  Respiratory:  Respirations regular and unlabored, clear to auscultation bilaterally. GI: Soft, nontender, nondistended, BS + x 4. MS: no deformity or atrophy. Skin: warm and dry, no rash. Neuro:  Strength and sensation are  intact. Psych: Normal affect.  Accessory Clinical Findings    ECG personally reviewed by me today - EKG Interpretation Date/Time:  Thursday December 16 2024 13:52:39 EST Ventricular Rate:  86 PR Interval:  194 QRS Duration:  134 QT Interval:  460 QTC Calculation: 550 R Axis:   -34  Text Interpretation: Sinus rhythm with Possible Fusion complexes Left axis deviation Right bundle branch block Possible Inferior infarct (cited on or before 15-Oct-2023) Anterolateral infarct (cited on or before 15-Oct-2023) When compared with ECG of 15-Oct-2023 15:50, Fusion complexes are now Present Questionable change in initial forces of Lateral leads Confirmed by Daneen Perkins ([id number]) on 12/16/2024 2:02:47 PM  - no acute changes.   Lab Results  Component Value Date   WBC 7.8 11/06/2023   HGB 10.7 (L) 11/06/2023   HCT 33.6 (L) 11/06/2023   MCV 99.7 11/06/2023   PLT 167 11/06/2023   Lab Results  Component Value Date   CREATININE 8.39 (HH) 11/06/2023   BUN 40 (H) 11/06/2023   NA 143 11/06/2023   K 4.4 11/06/2023   CL 97 (L) 11/06/2023   CO2 36 (H) 11/06/2023    Lab Results  Component Value Date   ALT 27 11/06/2023   AST 23 11/06/2023   ALKPHOS 223 (H) 11/06/2023   BILITOT 0.5 11/06/2023   Lab Results  Component Value Date   CHOL 164 03/18/2021   HDL 46 03/18/2021   LDLCALC 103 (H) 03/18/2021   TRIG 75 03/18/2021   CHOLHDL 3.6 03/18/2021    Lab Results  Component Value Date   HGBA1C 5.2 05/02/2023    Assessment & Plan    1. CAD: Coronary calcium  score in 08/2021 was 3824 (90th percentile). Myoview  in 09/2021 was negative for ischemia.  She reports mild dyspnea on exertion with more strenuous activities, this has been ongoing and stable for years.  Continue to monitor for progressive symptoms.  No indication for ischemic evaluation at this time.  Continue Lipitor.  2. Paroxysmal atrial fibrillation:  Most recent echocardiogram in 03/2024 showed EF 70 to 75%, hyperdynamic LV function, no RWMA, moderate concentric LVH, normal RV systolic function, mild to moderate aortic valve sclerosis without evidence of aortic stenosis.  She denies any palpitations.  Maintaining sinus rhythm.  Denies bleeding. Will update CBC, CMET, TSH.  Will update chest x-ray for amiodarone  monitoring.  Encouraged her to complete her annual eye exam.  Continue amiodarone , Eliquis .  3. History of pericarditis: S/p pericardial window in 2020.  No recurrence.  Most recent echo stable as above.  4. History of DVT: No recurrence. On Eliquis .  5. Hypertension/hypotension: BP stable.  Continue midodrine .  6. Hyperlipidemia: LDL was 74 in 09/2023.  Will update fasting lipid panel.  Continue Lipitor.  7. ESRD on HD: Followed by nephrology.   8. Type 2 diabetes: A1c was 5.3 in 04/2024.  Monitored and managed per PCP.   9. OSA: Not on CPAP.   10. Preoperative cardiac exam: According to the Revised Cardiac Risk Index (RCRI), her Perioperative Risk of Major Cardiac Event is (%): 0.9Her Functional Capacity in METs is: 4.06 according to the Duke Activity Status Index (DASI).  Therefore, based on ACC/AHA guidelines, patient would be at acceptable risk for the planned procedure without further cardiovascular testing. Needs updated CBC, BMET prior to pharmacy recommendations.  Once lab results are available, I will send request to pharmacy for recommendations on holding anticoagulation prior to surgery.  Once finalized, I will route recommendations  to the requesting party via Epic  fax function.   ADDENDUM 12/28/2024: Per office protocol, patient can hold Eliquis  for 2 days prior to procedure.  Patient will not need bridging with Lovenox  (enoxaparin ) around procedure. Please resume Eliquis  as soon as possible postprocedure, at the discretion of the surgeon. I will route recommendations  to the requesting party via Epic fax function.   11. Disposition: Follow-up in 6 months, sooner if needed.      Damien JAYSON Braver, NP 12/16/2024, 2:25 PM       [1]  Allergies Allergen Reactions   Iodine  Shortness Of Breath   Shellfish Allergy Shortness Of Breath    Breathing Problems   Amlodipine Swelling   Felodipine Swelling    Headache   Lisinopril Cough   Chlorhexidine  Itching    CHG wipes   "

## 2024-12-17 LAB — CBC
Hematocrit: 28.8 % — ABNORMAL LOW (ref 34.0–46.6)
Hemoglobin: 9.3 g/dL — ABNORMAL LOW (ref 11.1–15.9)
MCH: 32 pg (ref 26.6–33.0)
MCHC: 32.3 g/dL (ref 31.5–35.7)
MCV: 99 fL — ABNORMAL HIGH (ref 79–97)
Platelets: 206 x10E3/uL (ref 150–450)
RBC: 2.91 x10E6/uL — ABNORMAL LOW (ref 3.77–5.28)
RDW: 14.3 % (ref 11.7–15.4)
WBC: 8.5 x10E3/uL (ref 3.4–10.8)

## 2024-12-17 LAB — LIPID PANEL
Cholesterol, Total: 179 mg/dL (ref 100–199)
HDL: 58 mg/dL
LDL CALC COMMENT:: 3.1 ratio (ref 0.0–4.4)
LDL Chol Calc (NIH): 109 mg/dL — AB (ref 0–99)
Triglycerides: 62 mg/dL (ref 0–149)
VLDL Cholesterol Cal: 12 mg/dL (ref 5–40)

## 2024-12-17 LAB — COMPREHENSIVE METABOLIC PANEL WITH GFR
ALT: 12 IU/L (ref 0–32)
AST: 16 IU/L (ref 0–40)
Albumin: 3.6 g/dL — AB (ref 3.9–4.9)
Alkaline Phosphatase: 98 IU/L (ref 49–135)
BUN/Creatinine Ratio: 6 — AB (ref 12–28)
BUN: 49 mg/dL — AB (ref 8–27)
Bilirubin Total: 0.4 mg/dL (ref 0.0–1.2)
CO2: 25 mmol/L (ref 20–29)
Calcium: 9.4 mg/dL (ref 8.7–10.3)
Chloride: 98 mmol/L (ref 96–106)
Creatinine, Ser: 8.03 mg/dL — AB (ref 0.57–1.00)
Globulin, Total: 2.8 g/dL (ref 1.5–4.5)
Glucose: 74 mg/dL (ref 70–99)
Potassium: 4.2 mmol/L (ref 3.5–5.2)
Sodium: 147 mmol/L — AB (ref 134–144)
Total Protein: 6.4 g/dL (ref 6.0–8.5)
eGFR: 5 mL/min/1.73 — AB

## 2024-12-17 LAB — TSH: TSH: 1.91 u[IU]/mL (ref 0.450–4.500)

## 2024-12-23 ENCOUNTER — Ambulatory Visit: Payer: Self-pay | Admitting: Nurse Practitioner

## 2024-12-23 DIAGNOSIS — E785 Hyperlipidemia, unspecified: Secondary | ICD-10-CM

## 2024-12-23 DIAGNOSIS — Z79899 Other long term (current) drug therapy: Secondary | ICD-10-CM

## 2024-12-27 NOTE — Telephone Encounter (Signed)
 Patient with diagnosis of atrial fibrillation on Eliquis  for anticoagulation.    Procedure: Left Carpal tunnel release Date of procedure: 01/20/25   CHA2DS2-VASc Score = 5   This indicates a 7.2% annual risk of stroke. The patient's score is based upon: CHF History: 0 HTN History: 1 Diabetes History: 1 Stroke History: 0 Vascular Disease History: 1 Age Score: 1 Gender Score: 1    CrCl  ESRD on diaysis Platelet count 206  Patient has not had an Afib/aflutter ablation in the last 3 months, DCCV within the last 4 weeks or a watchman implanted in the last 45 days   Patient does not require pre-op antibiotics for dental procedure.  Per office protocol, patient can hold Eliquis  for 2 days prior to procedure.   Patient will not need bridging with Lovenox  (enoxaparin ) around procedure.  **This guidance is not considered finalized until pre-operative APP has relayed final recommendations.**

## 2025-01-06 ENCOUNTER — Ambulatory Visit

## 2025-01-19 ENCOUNTER — Ambulatory Visit: Admitting: Podiatry

## 2025-01-20 ENCOUNTER — Inpatient Hospital Stay (HOSPITAL_COMMUNITY): Admit: 2025-01-20 | Admitting: Orthopedic Surgery

## 2025-01-20 SURGERY — CARPAL TUNNEL RELEASE
Anesthesia: General | Site: Wrist | Laterality: Left
